# Patient Record
Sex: Male | Born: 1945 | Race: White | Hispanic: No | Smoking: Former smoker
Health system: Southern US, Community
[De-identification: ages and names within clinical notes are randomized; demographics above are authoritative.]

## PROBLEM LIST (undated history)

## (undated) DIAGNOSIS — R350 Frequency of micturition: Secondary | ICD-10-CM

## (undated) DIAGNOSIS — H9319 Tinnitus, unspecified ear: Secondary | ICD-10-CM

## (undated) DIAGNOSIS — G4730 Sleep apnea, unspecified: Secondary | ICD-10-CM

## (undated) DIAGNOSIS — M25562 Pain in left knee: Secondary | ICD-10-CM

## (undated) DIAGNOSIS — I2510 Atherosclerotic heart disease of native coronary artery without angina pectoris: Secondary | ICD-10-CM

## (undated) DIAGNOSIS — K5790 Diverticulosis of intestine, part unspecified, without perforation or abscess without bleeding: Secondary | ICD-10-CM

## (undated) DIAGNOSIS — E119 Type 2 diabetes mellitus without complications: Secondary | ICD-10-CM

## (undated) DIAGNOSIS — J984 Other disorders of lung: Secondary | ICD-10-CM

## (undated) DIAGNOSIS — R3915 Urgency of urination: Secondary | ICD-10-CM

## (undated) DIAGNOSIS — K219 Gastro-esophageal reflux disease without esophagitis: Secondary | ICD-10-CM

## (undated) DIAGNOSIS — Z973 Presence of spectacles and contact lenses: Secondary | ICD-10-CM

## (undated) DIAGNOSIS — M1990 Unspecified osteoarthritis, unspecified site: Secondary | ICD-10-CM

## (undated) DIAGNOSIS — R011 Cardiac murmur, unspecified: Secondary | ICD-10-CM

## (undated) DIAGNOSIS — I350 Nonrheumatic aortic (valve) stenosis: Secondary | ICD-10-CM

## (undated) DIAGNOSIS — E785 Hyperlipidemia, unspecified: Secondary | ICD-10-CM

## (undated) DIAGNOSIS — R7303 Prediabetes: Secondary | ICD-10-CM

## (undated) DIAGNOSIS — I4891 Unspecified atrial fibrillation: Secondary | ICD-10-CM

## (undated) DIAGNOSIS — K449 Diaphragmatic hernia without obstruction or gangrene: Secondary | ICD-10-CM

## (undated) DIAGNOSIS — N1330 Unspecified hydronephrosis: Secondary | ICD-10-CM

## (undated) DIAGNOSIS — I10 Essential (primary) hypertension: Secondary | ICD-10-CM

## (undated) DIAGNOSIS — N3281 Overactive bladder: Secondary | ICD-10-CM

## (undated) DIAGNOSIS — J45909 Unspecified asthma, uncomplicated: Secondary | ICD-10-CM

## (undated) DIAGNOSIS — N529 Male erectile dysfunction, unspecified: Secondary | ICD-10-CM

## (undated) DIAGNOSIS — M25561 Pain in right knee: Secondary | ICD-10-CM

## (undated) HISTORY — DX: Essential (primary) hypertension: I10

## (undated) HISTORY — PX: CHOLECYSTECTOMY: SHX55

## (undated) HISTORY — DX: Unspecified asthma, uncomplicated: J45.909

## (undated) HISTORY — DX: Hyperlipidemia, unspecified: E78.5

## (undated) HISTORY — DX: Prediabetes: R73.03

## (undated) HISTORY — DX: Pain in right knee: M25.561

## (undated) HISTORY — DX: Unspecified atrial fibrillation: I48.91

## (undated) HISTORY — PX: TONSILLECTOMY: SUR1361

## (undated) HISTORY — DX: Cardiac murmur, unspecified: R01.1

## (undated) HISTORY — DX: Male erectile dysfunction, unspecified: N52.9

## (undated) HISTORY — DX: Unspecified osteoarthritis, unspecified site: M19.90

## (undated) HISTORY — DX: Pain in right knee: M25.562

## (undated) HISTORY — PX: FINGER SURGERY: SHX640

## (undated) HISTORY — DX: Diaphragmatic hernia without obstruction or gangrene: K44.9

## (undated) HISTORY — DX: Diverticulosis of intestine, part unspecified, without perforation or abscess without bleeding: K57.90

## (undated) HISTORY — DX: Gastro-esophageal reflux disease without esophagitis: K21.9

---

## 1998-04-29 ENCOUNTER — Emergency Department (HOSPITAL_COMMUNITY): Admission: EM | Admit: 1998-04-29 | Discharge: 1998-04-29 | Payer: Self-pay | Admitting: Emergency Medicine

## 1998-04-29 ENCOUNTER — Encounter: Payer: Self-pay | Admitting: Emergency Medicine

## 1999-07-31 ENCOUNTER — Encounter: Payer: Self-pay | Admitting: Family Medicine

## 1999-07-31 ENCOUNTER — Encounter: Admission: RE | Admit: 1999-07-31 | Discharge: 1999-07-31 | Payer: Self-pay | Admitting: Family Medicine

## 1999-08-01 ENCOUNTER — Emergency Department (HOSPITAL_COMMUNITY): Admission: EM | Admit: 1999-08-01 | Discharge: 1999-08-01 | Payer: Self-pay | Admitting: Emergency Medicine

## 1999-08-07 ENCOUNTER — Encounter: Admission: RE | Admit: 1999-08-07 | Discharge: 1999-08-07 | Payer: Self-pay | Admitting: Family Medicine

## 1999-08-07 ENCOUNTER — Encounter: Payer: Self-pay | Admitting: Family Medicine

## 1999-09-06 ENCOUNTER — Encounter: Payer: Self-pay | Admitting: General Surgery

## 1999-09-06 ENCOUNTER — Ambulatory Visit (HOSPITAL_COMMUNITY): Admission: RE | Admit: 1999-09-06 | Discharge: 1999-09-07 | Payer: Self-pay | Admitting: General Surgery

## 1999-09-06 ENCOUNTER — Encounter (INDEPENDENT_AMBULATORY_CARE_PROVIDER_SITE_OTHER): Payer: Self-pay | Admitting: *Deleted

## 2000-12-21 ENCOUNTER — Emergency Department (HOSPITAL_COMMUNITY): Admission: EM | Admit: 2000-12-21 | Discharge: 2000-12-21 | Payer: Self-pay | Admitting: Emergency Medicine

## 2000-12-21 ENCOUNTER — Encounter: Payer: Self-pay | Admitting: Emergency Medicine

## 2001-12-08 ENCOUNTER — Inpatient Hospital Stay (HOSPITAL_COMMUNITY): Admission: EM | Admit: 2001-12-08 | Discharge: 2001-12-09 | Payer: Self-pay | Admitting: *Deleted

## 2001-12-08 ENCOUNTER — Encounter: Payer: Self-pay | Admitting: Cardiology

## 2001-12-09 ENCOUNTER — Encounter: Payer: Self-pay | Admitting: Cardiovascular Disease

## 2003-01-09 ENCOUNTER — Emergency Department (HOSPITAL_COMMUNITY): Admission: EM | Admit: 2003-01-09 | Discharge: 2003-01-09 | Payer: Self-pay | Admitting: Emergency Medicine

## 2008-02-20 ENCOUNTER — Emergency Department (HOSPITAL_COMMUNITY): Admission: EM | Admit: 2008-02-20 | Discharge: 2008-02-20 | Payer: Self-pay | Admitting: Emergency Medicine

## 2008-08-01 ENCOUNTER — Ambulatory Visit: Payer: Self-pay | Admitting: Family Medicine

## 2008-11-23 ENCOUNTER — Ambulatory Visit: Payer: Self-pay | Admitting: Family Medicine

## 2009-06-12 ENCOUNTER — Ambulatory Visit: Payer: Self-pay | Admitting: Family Medicine

## 2009-06-12 ENCOUNTER — Encounter: Payer: Self-pay | Admitting: Cardiovascular Disease

## 2009-08-21 ENCOUNTER — Ambulatory Visit: Payer: Self-pay | Admitting: Physician Assistant

## 2009-11-28 ENCOUNTER — Ambulatory Visit: Payer: Self-pay | Admitting: Physician Assistant

## 2009-12-12 ENCOUNTER — Ambulatory Visit: Payer: Self-pay | Admitting: Physician Assistant

## 2010-02-09 ENCOUNTER — Ambulatory Visit: Payer: Self-pay | Admitting: Family Medicine

## 2010-04-09 ENCOUNTER — Ambulatory Visit: Payer: Self-pay | Admitting: Family Medicine

## 2010-04-09 ENCOUNTER — Encounter: Payer: Self-pay | Admitting: Cardiovascular Disease

## 2010-04-23 ENCOUNTER — Encounter: Payer: Self-pay | Admitting: Cardiovascular Disease

## 2010-04-23 ENCOUNTER — Ambulatory Visit: Payer: Self-pay | Admitting: Family Medicine

## 2010-05-02 DIAGNOSIS — M5430 Sciatica, unspecified side: Secondary | ICD-10-CM | POA: Insufficient documentation

## 2010-05-02 DIAGNOSIS — Z872 Personal history of diseases of the skin and subcutaneous tissue: Secondary | ICD-10-CM | POA: Insufficient documentation

## 2010-05-02 DIAGNOSIS — J301 Allergic rhinitis due to pollen: Secondary | ICD-10-CM | POA: Insufficient documentation

## 2010-05-02 DIAGNOSIS — Z8719 Personal history of other diseases of the digestive system: Secondary | ICD-10-CM | POA: Insufficient documentation

## 2010-05-08 ENCOUNTER — Ambulatory Visit: Payer: Self-pay | Admitting: Cardiovascular Disease

## 2010-05-08 ENCOUNTER — Encounter: Payer: Self-pay | Admitting: Cardiovascular Disease

## 2010-05-08 DIAGNOSIS — R079 Chest pain, unspecified: Secondary | ICD-10-CM | POA: Insufficient documentation

## 2010-05-08 DIAGNOSIS — R072 Precordial pain: Secondary | ICD-10-CM | POA: Insufficient documentation

## 2010-05-15 ENCOUNTER — Telehealth (INDEPENDENT_AMBULATORY_CARE_PROVIDER_SITE_OTHER): Payer: Self-pay | Admitting: Radiology

## 2010-05-16 ENCOUNTER — Encounter: Payer: Self-pay | Admitting: Cardiology

## 2010-05-16 ENCOUNTER — Ambulatory Visit: Payer: Self-pay

## 2010-05-16 ENCOUNTER — Encounter (HOSPITAL_COMMUNITY)
Admission: RE | Admit: 2010-05-16 | Discharge: 2010-06-19 | Payer: Self-pay | Source: Home / Self Care | Attending: Cardiovascular Disease | Admitting: Cardiovascular Disease

## 2010-05-20 HISTORY — PX: TOTAL KNEE ARTHROPLASTY: SHX125

## 2010-06-04 ENCOUNTER — Inpatient Hospital Stay (HOSPITAL_COMMUNITY)
Admission: RE | Admit: 2010-06-04 | Discharge: 2010-06-08 | Payer: Self-pay | Source: Home / Self Care | Attending: Orthopedic Surgery | Admitting: Orthopedic Surgery

## 2010-06-04 LAB — SURGICAL PCR SCREEN
MRSA, PCR: NEGATIVE
Staphylococcus aureus: NEGATIVE

## 2010-06-04 LAB — DIFFERENTIAL
Basophils Absolute: 0.1 10*3/uL (ref 0.0–0.1)
Basophils Relative: 1 % (ref 0–1)
Eosinophils Absolute: 0.1 10*3/uL (ref 0.0–0.7)
Eosinophils Relative: 2 % (ref 0–5)
Lymphocytes Relative: 24 % (ref 12–46)
Lymphs Abs: 1.5 10*3/uL (ref 0.7–4.0)
Monocytes Absolute: 0.8 10*3/uL (ref 0.1–1.0)
Monocytes Relative: 13 % — ABNORMAL HIGH (ref 3–12)
Neutro Abs: 3.6 10*3/uL (ref 1.7–7.7)
Neutrophils Relative %: 60 % (ref 43–77)

## 2010-06-04 LAB — COMPREHENSIVE METABOLIC PANEL
ALT: 43 U/L (ref 0–53)
AST: 29 U/L (ref 0–37)
Albumin: 4.1 g/dL (ref 3.5–5.2)
Alkaline Phosphatase: 55 U/L (ref 39–117)
BUN: 12 mg/dL (ref 6–23)
CO2: 25 mEq/L (ref 19–32)
Calcium: 9.4 mg/dL (ref 8.4–10.5)
Chloride: 105 mEq/L (ref 96–112)
Creatinine, Ser: 0.93 mg/dL (ref 0.4–1.5)
GFR calc Af Amer: >60 mL/min (ref >60)
GFR calc non Af Amer: >60 mL/min (ref >60)
Glucose, Bld: 108 mg/dL — ABNORMAL HIGH (ref 70–99)
Potassium: 4.5 mEq/L (ref 3.5–5.1)
Sodium: 138 mEq/L (ref 135–145)
Total Bilirubin: 0.8 mg/dL (ref 0.3–1.2)
Total Protein: 6.4 g/dL (ref 6.0–8.3)

## 2010-06-04 LAB — CBC
HCT: 45.5 % (ref 39.0–52.0)
Hemoglobin: 15.6 g/dL (ref 13.0–17.0)
MCH: 30.8 pg (ref 26.0–34.0)
MCHC: 34.3 g/dL (ref 30.0–36.0)
MCV: 89.7 fL (ref 78.0–100.0)
Platelets: 215 10*3/uL (ref 150–400)
RBC: 5.07 MIL/uL (ref 4.22–5.81)
RDW: 13.6 % (ref 11.5–15.5)
WBC: 6 10*3/uL (ref 4.0–10.5)

## 2010-06-04 LAB — URINALYSIS, ROUTINE W REFLEX MICROSCOPIC
Bilirubin Urine: NEGATIVE
Hgb urine dipstick: NEGATIVE
Ketones, ur: NEGATIVE mg/dL
Nitrite: NEGATIVE
Protein, ur: NEGATIVE mg/dL
Specific Gravity, Urine: 1.019 (ref 1.005–1.030)
Urine Glucose, Fasting: NEGATIVE mg/dL
Urobilinogen, UA: 0.2 mg/dL (ref 0.0–1.0)
pH: 6 (ref 5.0–8.0)

## 2010-06-04 LAB — URINE CULTURE
Colony Count: NO GROWTH
Culture  Setup Time: 201201111214
Culture: NO GROWTH

## 2010-06-04 LAB — PROTIME-INR
INR: 1.04 (ref 0.00–1.49)
Prothrombin Time: 13.8 seconds (ref 11.6–15.2)

## 2010-06-04 LAB — APTT: aPTT: 28 seconds (ref 24–37)

## 2010-06-04 LAB — ABO/RH: ABO/RH(D): A NEG

## 2010-06-06 LAB — BASIC METABOLIC PANEL
BUN: 14 mg/dL (ref 6–23)
CO2: 29 mEq/L (ref 19–32)
Calcium: 8.8 mg/dL (ref 8.4–10.5)
Chloride: 103 mEq/L (ref 96–112)
Creatinine, Ser: 0.94 mg/dL (ref 0.4–1.5)
GFR calc Af Amer: >60 mL/min (ref >60)
GFR calc non Af Amer: >60 mL/min (ref >60)
Glucose, Bld: 132 mg/dL — ABNORMAL HIGH (ref 70–99)
Potassium: 4 mEq/L (ref 3.5–5.1)
Sodium: 140 mEq/L (ref 135–145)

## 2010-06-06 LAB — CROSSMATCH
ABO/RH(D): A NEG
Antibody Screen: NEGATIVE
Unit division: 0
Unit division: 0

## 2010-06-06 LAB — CBC
HCT: 36.7 % — ABNORMAL LOW (ref 39.0–52.0)
Hemoglobin: 12.3 g/dL — ABNORMAL LOW (ref 13.0–17.0)
MCH: 30.1 pg (ref 26.0–34.0)
MCHC: 33.5 g/dL (ref 30.0–36.0)
MCV: 90 fL (ref 78.0–100.0)
Platelets: 192 10*3/uL (ref 150–400)
RBC: 4.08 MIL/uL — ABNORMAL LOW (ref 4.22–5.81)
RDW: 13.7 % (ref 11.5–15.5)
WBC: 12 10*3/uL — ABNORMAL HIGH (ref 4.0–10.5)

## 2010-06-11 LAB — CBC
HCT: 30.9 % — ABNORMAL LOW (ref 39.0–52.0)
HCT: 28.3 % — ABNORMAL LOW (ref 39.0–52.0)
Hemoglobin: 10.3 g/dL — ABNORMAL LOW (ref 13.0–17.0)
Hemoglobin: 9.5 g/dL — ABNORMAL LOW (ref 13.0–17.0)
MCH: 30.2 pg (ref 26.0–34.0)
MCH: 30 pg (ref 26.0–34.0)
MCHC: 33.3 g/dL (ref 30.0–36.0)
MCHC: 33.6 g/dL (ref 30.0–36.0)
MCV: 90.6 fL (ref 78.0–100.0)
MCV: 89.3 fL (ref 78.0–100.0)
Platelets: 166 10*3/uL (ref 150–400)
Platelets: 168 10*3/uL (ref 150–400)
RBC: 3.41 MIL/uL — ABNORMAL LOW (ref 4.22–5.81)
RBC: 3.17 MIL/uL — ABNORMAL LOW (ref 4.22–5.81)
RDW: 14.1 % (ref 11.5–15.5)
RDW: 14 % (ref 11.5–15.5)
WBC: 12.3 10*3/uL — ABNORMAL HIGH (ref 4.0–10.5)
WBC: 8.6 10*3/uL (ref 4.0–10.5)

## 2010-06-11 LAB — BASIC METABOLIC PANEL
BUN: 21 mg/dL (ref 6–23)
BUN: 13 mg/dL (ref 6–23)
CO2: 24 mEq/L (ref 19–32)
CO2: 26 mEq/L (ref 19–32)
Calcium: 8.1 mg/dL — ABNORMAL LOW (ref 8.4–10.5)
Calcium: 8.1 mg/dL — ABNORMAL LOW (ref 8.4–10.5)
Chloride: 101 mEq/L (ref 96–112)
Chloride: 105 mEq/L (ref 96–112)
Creatinine, Ser: 0.82 mg/dL (ref 0.4–1.5)
Creatinine, Ser: 0.87 mg/dL (ref 0.4–1.5)
GFR calc Af Amer: >60 mL/min (ref >60)
GFR calc Af Amer: >60 mL/min (ref >60)
GFR calc non Af Amer: >60 mL/min (ref >60)
GFR calc non Af Amer: >60 mL/min (ref >60)
Glucose, Bld: 132 mg/dL — ABNORMAL HIGH (ref 70–99)
Glucose, Bld: 125 mg/dL — ABNORMAL HIGH (ref 70–99)
Potassium: 4.4 mEq/L (ref 3.5–5.1)
Potassium: 4.3 mEq/L (ref 3.5–5.1)
Sodium: 133 mEq/L — ABNORMAL LOW (ref 135–145)
Sodium: 135 mEq/L (ref 135–145)

## 2010-06-21 NOTE — Progress Notes (Signed)
Summary: Nuc pre-Procedure  Phone Note Outgoing Call Call back at Home Phone [phone]   Call placed by: Henrine Screws Call placed to: Patient Reason for Call: Confirm/change Appt Summary of Call: Left message with information on Myoview Information Sheet (see scanned document for details).  Initial call taken by: Leonia Corona, RT-N,  May 15, 2010 5:27 PM     Nuclear Med Background Indications for Stress Test: Evaluation for Ischemia, Surgical Clearance  Indications Comments: Pending left knee surgery  History: Myocardial Perfusion Study  History Comments: 2003- Nl. MPS. EF= 65%     Nuclear Pre-Procedure Cardiac Risk Factors: Hypertension, Lipids, Smoker Height (in): 72

## 2010-06-21 NOTE — Letter (Signed)
Summary: [facility]  [facility]   Imported By: Marylou Mccoy 05/29/2010 11:50:51  _____________________________________________________________________  External Attachment:    Type:   Image     Comment:   External Document

## 2010-06-21 NOTE — Letter (Signed)
Summary: [facility]  [facility]   Imported By: Marylou Mccoy 05/29/2010 11:51:49  _____________________________________________________________________  External Attachment:    Type:   Image     Comment:   External Document

## 2010-06-21 NOTE — Assessment & Plan Note (Signed)
Summary: np6. surgical clearance for left total knee. pt has aarp . me...   Visit Type:  Initial Consult Primary Kole Hilyard:  Sharlot Gowda, MD  CC:  Pre-op risk assessment.  History of Present Illness: 65 yo white male with history of OA, HTN, hyperlipidemia and tobacco abuse who is here today for pre-operative risk assessment prior to planned left knee surgery. He tells me that he has been feeling well except for left knee pain. He has his typical GERD type symptoms but no exertional chest pain. He has no dyspnea with exertion. No dizziness, near syncope or syncope. He tells me that his blood pressure has been well controlled. He has been on a statin for several years. He is an active individual. He stopped smoking about 4 months ago. He tells me that he has occasional pain in his right leg along the lateral aspect of the lower leg. He has no cramping in the calf muscles c/w claudication.   Current Medications (verified): 1)  Pravachol 40 Mg Tabs (Pravastatin Sodium) .Marland Kitchen.. 1 By Mouth Daily 2)  Benicar 20 Mg Tabs (Olmesartan Medoxomil) .Marland Kitchen.. 1 By Mouth Daily 3)  Toprol Xl 50 Mg Xr24h-Tab (Metoprolol Succinate) .Marland Kitchen.. 1 By Mouth Daily 4)  Fish Oil   Oil (Fish Oil) .... 4 By Mouth Daily 5)  Multivitamins   Tabs (Multiple Vitamin) .Marland Kitchen.. 1 By Mouth Daily 6)  Aspirin 81 Mg  Tabs (Aspirin) .Marland Kitchen.. 1 By Mouth Daily 7)  Glucosamine-Chondroitin 250-200 Mg Caps (Glucosamine-Chondroitin) .Marland Kitchen.. 1 By Mouth Daily  Allergies (verified): 1)  ! Tagamet  Past History:  Past Medical History: HIATAL HERNIA HTN ALLERGIC RHINITIS OA bilateral knees Hyperlipidemia  Past Surgical History: Cholecystectomy Tonsillectomy Finger surgery  Family History: Mother died at age 70 from uterine cancer Father died at age 32 from ?natural causes-?CAD No siblings Paternal Grandfather with CAD  Social History: The patient lives in [place]  He is  married.    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  He has  a tobacco history-smoked for 25 years-quit September 2011 He also drinks one to two glasses of wine  per day. No illlicit drug use   Drug Use - no  Review of Systems       The patient complains of chest pain.  The patient denies fatigue, malaise, fever, weight gain/loss, vision loss, decreased hearing, hoarseness, palpitations, shortness of breath, prolonged cough, wheezing, sleep apnea, coughing up blood, abdominal pain, blood in stool, nausea, vomiting, diarrhea, heartburn, incontinence, blood in urine, muscle weakness, joint pain, leg swelling, rash, skin lesions, headache, fainting, dizziness, depression, anxiety, enlarged lymph nodes, easy bruising or bleeding, and environmental allergies.    Vital Signs:  Patient profile:   65 year old male Height:      72 inches Weight:      248 pounds BMI:     33.76 Pulse rate:   64 / minute Resp:     16 per minute BP sitting:   142 / 84  (right arm)  Vitals Entered By: Marrion Coy, CNA (May 08, 2010 8:35 AM)  Physical Exam  General:  General: Well developed, well nourished, NAD HEENT: OP clear, mucus membranes moist SKIN: warm, dry Neuro: No focal deficits Musculoskeletal: Muscle strength 5/5 all ext Psychiatric: Mood and affect normal Neck: No JVD, no carotid bruits, no thyromegaly, no lymphadenopathy. Lungs:Clear bilaterally, no wheezes, rhonci, crackles CV: RRR no murmurs, gallops rubs Abdomen: soft, NT, ND, BS present Extremities: No edema, pulses 2+.  EKG  Procedure date:  05/08/2010  Findings:      NSR, rate 63 bpm  Impression & Recommendations:  Problem # 1:  PRE-OPERATIVE CARDIAC EXAM (ICD-V72.81) Given his risk factors for CAD including family history, tobacco abuse, HTN, hyperlipidemia and obesity, recommend some form of ischemic testing. We have discussed exercise testing but he does not think he can exercise adequately with his severe knee pain. Will arrange Lexiscan stress myoview to rule out ischemia  prior to his planned surgical procedure.   His updated medication list for this problem includes:    Toprol Xl 50 Mg Xr24h-tab (Metoprolol succinate) .Marland Kitchen... 1 by mouth daily    Aspirin 81 Mg Tabs (Aspirin) .Marland Kitchen... 1 by mouth daily  Problem # 2:  CHEST PAIN-PRECORDIAL (ICD-786.51) See above.   His updated medication list for this problem includes:    Toprol Xl 50 Mg Xr24h-tab (Metoprolol succinate) .Marland Kitchen... 1 by mouth daily    Aspirin 81 Mg Tabs (Aspirin) .Marland Kitchen... 1 by mouth daily  Orders: Nuclear Stress Test (Nuc Stress Test)  Other Orders: EKG w/ Interpretation (93000)  Patient Instructions: 1)  Your physician has requested that you have a lexiscan myoview.  For further information please visit [email or web address]  Please follow instruction sheet, as given.

## 2010-06-21 NOTE — Assessment & Plan Note (Signed)
Summary: Cardiology Nuclear Testing  Nuclear Med Background Indications for Stress Test: Evaluation for Ischemia, Surgical Clearance  Indications Comments: Pending left knee surgery by Dr. Georgena Spurling  History: Myocardial Perfusion Study  History Comments: '03 ZOX:WRUEAV, EF=65%   Symptoms Comments: no cardiac complaints   Nuclear Pre-Procedure Cardiac Risk Factors: Family History - CAD, History of Smoking, Hypertension, Lipids, Obesity Caffeine/Decaff Intake: none  NPO After: 7:00 PM Lungs: Clear.  O2 Sat 98% on RA. IV 0.9% NS with Angio Cath: 22g     IV Site: R Hand IV Started by: Cathlyn Parsons, RN Chest Size (in) 42     Height (in): 72 Weight (lb): 243 BMI: 33.08 Tech Comments: Toprol held x 13 hours.  Nuclear Med Study 1 or 2 day study:  1 day     Stress Test Type:  Eugenie Birks Reading MD:  Olga Millers, MD     Referring MD:  Verne Carrow, MD Resting Radionuclide:  Technetium 71m Tetrofosmin     Resting Radionuclide Dose:  10.7 mCi  Stress Radionuclide:  Technetium 73m Tetrofosmin     Stress Radionuclide Dose:  33.0 mCi   Stress Protocol   Lexiscan: 0.4 mg   Stress Test Technologist:  Rea College, CMA-N     Nuclear Technologist:  Doyne Keel, CNMT  Rest Procedure  Myocardial perfusion imaging was performed at rest 45 minutes following the intravenous administration of Technetium 14m Tetrofosmin.  Stress Procedure  The patient received IV Lexiscan 0.4 mg over 15-seconds.  Technetium 66m Tetrofosmin injected at 30-seconds.  There were no significant changes with infusion.  Quantitative spect images were obtained after a 45 minute delay.  QPS Raw Data Images:  Acquisition technically good; normal left ventricular size. Stress Images:  There is decreased uptake in the inferior wall Rest Images:  There is decreased uptake in the inferior wall. Subtraction (SDS):  No evidence of ischemia. Transient Ischemic Dilatation:  0.87  (Normal <1.22)  Lung/Heart  Ratio:  0.21  (Normal <0.45)  Quantitative Gated Spect Images QGS EDV:  102 ml QGS ESV:  34 ml QGS EF:  67 % QGS cine images:  Normal wall motion.   Overall Impression  Exercise Capacity: Lexiscan with no exercise. BP Response: Normal blood pressure response. Clinical Symptoms: No chest pain ECG Impression: No significant ST segment change suggestive of ischemia. Overall Impression: Normal lexiscan nuclear study with inferior thinning but no ischemia.  Appended Document: Cardiology Nuclear Testing Normal nuclear study. No ischemia. He does not need further cardiac workup before his surgery. Can we let him know? thanks, cdm  Appended Document: Cardiology Nuclear Testing Pt aware of results and were sent at his request to Dr. Susann Givens and Dr. Sherlean Foot. Mylo Red RN

## 2010-07-10 NOTE — Discharge Summary (Signed)
NAME:  [place], [place] NO.:  [id number]  MEDICAL RECORD NO.:  [id number]          PATIENT TYPE:  INP  LOCATION:  5011                         FACILITY:  [facility]  PHYSICIAN:  Mila Homer. Sherlean Foot, M.D. DATE OF BIRTH:  1945-06-08  DATE OF ADMISSION:  06/04/2010 DATE OF DISCHARGE:  06/08/2010                              DISCHARGE SUMMARY   ADMISSION DIAGNOSIS:  Osteoarthritis of the left knee.  DISCHARGE DIAGNOSES: 1. Osteoarthritis of the left knee. 2. Status post left total knee arthroplasty. 3. Acute blood loss anemia status post surgery.  PROCEDURE:  Left total knee arthroplasty.  HISTORY:  The patient is a 65 year old male complaining of pain in left knee now interfering with activities of daily living.  Conservative treatment had failed.  Risks and benefits of surgery were discussed with the patient.  The patient would like to proceed with surgery.  ALLERGIES:  UPON ADMISSION TO THE HOSPITAL THE PATIENT WAS ALLERGIC TO TAGAMET WHICH CAUSED NERVOUSNESS.  ADMISSION MEDICATIONS:  Upon admission to hospital the patient was taking: 1. Melatonin over-the-counter daily. 2. Ibuprofen 200 mg daily over-the-counter 1-2 tablets every 8 hours. 3. Over-the-counter aspirin daily, stopped 10 days before surgery. 4. Pravastatin 40 mg daily. 5. Metoprolol 50 mg every evening. 6. Benicar 20 mg daily.  HOSPITAL COURSE:  This is a 65 year old male admitted on June 04, 2010.  After appropriate laboratory studies were obtained preoperatively as well as Ancef on call to the operating room, he was taken to the OR where he underwent a left total knee arthroplasty.  He tolerated the procedure well and was taken to the PACU in good condition.  He was placed on pain medications IV as needed as well as p.o. pain medication.  Postop day #1 vital signs were stable.  The patient denied chest pain, shortness of breath or calf pain.  The patient was started on Xarelto 10 mg p.o.  daily at 8 a.m.  Consults with PT, OT and Care Management were made.  The patient was weightbearing as tolerated.  CPM 0 to 90 degrees for 6 to 8 hours per day.  Incentive spirometry was taught.  Postop day #2 patient continued to progress slowly with physical therapy.  Dressing was changed.  Marcaine pump and Hemovac were discontinued.  The patient was continued on p.o. pain medication. Postop day #3 the patient continued to progress with physical therapy a little bit more, still was not hitting his goals and ended up staying for 1 more night.  Postop day #4 the patient was able to hit all his physical therapy goals and was discharged that morning.  Upon discharge from the hospital the patient's vital signs were stable.  LABORATORY STUDIES:  The patient's H and H were 8.1/24.0.  Sodium, potassium, chloride, CO2, BUN and creatinine and glucose were all in normal range.  DISCHARGE INSTRUCTIONS:  There are no restrictions to diet.  He is to follow the wound care sheet for wound care.  Increase activity slowly. May use a cane or walker, weightbearing as tolerated.  No lifting or driving for 6 weeks.  Home health has been established.  CPM 0 to 90  degrees for 6 to 8 hours a day for 2 weeks and bilateral compression stockings for 3 weeks.  Upon discharge from the hospital the patient was given a prescription for Xarelto 10 mg daily for 10 days, Robaxin 500 mg 1 or 2 tabs every 6 to 8 hours for pain, oxycodone 5 mg 1 or 2 tabs every 4 to 6 hours for pain and OxyContin 20 mg 1 twice a day for pain.  The patient will follow up with Dr. Sherlean Foot in 2 weeks.  Call for appointment at [phone].  Discharged in improved condition.    ______________________________ Altamese Cabal, PA-C   ______________________________ Mila Homer. Sherlean Foot, M.D.    MJ/MEDQ  D:  06/26/2010  T:  06/26/2010  Job:  [id number]  Electronically Signed by Altamese Cabal PA-C on 07/10/2010 12:03:21 PM Electronically Signed  by Georgena Spurling M.D. on 07/10/2010 09:01:26 PM

## 2010-07-10 NOTE — Op Note (Signed)
NAME:  [place], [place] NO.:  [id number]  MEDICAL RECORD NO.:  [id number]          PATIENT TYPE:  INP  LOCATION:  5011                         FACILITY:  [facility]  PHYSICIAN:  Mila Homer. Sherlean Foot, M.D. DATE OF BIRTH:  10-09-1945  DATE OF PROCEDURE:  06/04/2010 DATE OF DISCHARGE:                              OPERATIVE REPORT   SURGEON:  Mila Homer. Sherlean Foot, MD  ASSISTANT:  Skip Mayer, PA-C  PREOPERATIVE DIAGNOSIS:  Left knee osteoarthritis.  POSTOPERATIVE DIAGNOSIS:  Left knee osteoarthritis.  PROCEDURE:  Left total knee arthroplasty.  INDICATIONS FOR PROCEDURE:  The patient is a 65 year old white male with failure of conservative measures for osteoarthritis of the knee. Informed consent was obtained.  DESCRIPTION OF PROCEDURE:  The patient was laid supine, administered general anesthesia.  Left leg was prepped and draped in the usual sterile fashion.  The extremity was exsanguinated with Esmarch. Tourniquet was inflated to 350 mmHg for an hour.  A midline incision was made with a #10 blade.  New blade was used to make a medial parapatellar arthrotomy and performed synovectomy.  I then elevated the deep MCL off the medial crest of tibia around semimembranosus tendon.  I everted the patella, it measured 25 mm thick.  I reamed down 9 mm, drilled through lug holes through a 38-mm template.  With prosthetic trial in place, I re-created a 25-mm thickness.  Then removed the trial component, went into flexion subluxing the patella laterally.  I used external alignment system on the tibia to make perpendicular cut to the anatomic axis of the tibia and 3 degrees posterior slope.  I used intramedullary system on the femur to make a 6-degree valgus cut.  Then marked out the epicondylar axis, posterior condylar angle measured 3 degrees.  I then sized to a size F, pinned to 3 degrees external rotation, holes made to the anterior and posterior chamfer cuts with sagittal saw  through the formal cutting block.  I then placed a lamina spreader in the knee, removed the ACL, PCL, medial lateral menisci, and posterior condylar osteophytes.  I placed a 12-mm space block and had good flexion and extension gap balance.  I was able then to get a size 14 in with no problem and had good extension and it cured his flexion contracture which was __________ posteromedial contracture.  Then, I removed trial components, copiously irrigated and cemented in the components and removed all excess cement.  Allowed the cement to harden in extension. A left Hemovac coming out superolaterally and deep to the arthrotomy. Pain catheter coming out superomedially and superficially to the arthrotomy.  I closed the arthrotomy with figure-of-eight #1 Vicryl sutures, buried 0 Vicryl in the deep soft tissue, subcuticular 2-0 Vicryl stitch, and skin staples.  Dressed with Xeroform dressing, sponges, sterile Webril, and TED stocking.  COMPLICATIONS:  None.  DRAINS:  One Hemovac and one pain catheter.  ESTIMATED BLOOD LOSS:  300 mL.        ______________________________ Mila Homer. Sherlean Foot, M.D.     SDL/MEDQ  D:  06/04/2010  T:  06/05/2010  Job:  [id number]  Electronically Signed by Georgena Spurling M.D. on  07/10/2010 09:01:20 PM

## 2010-10-01 ENCOUNTER — Telehealth: Payer: Self-pay | Admitting: Family Medicine

## 2010-10-01 MED ORDER — OLMESARTAN MEDOXOMIL 20 MG PO TABS: 20.0000 mg | ORAL_TABLET | Freq: Every day | ORAL | Status: DC

## 2010-10-01 NOTE — Telephone Encounter (Signed)
Patient wants a written prescription

## 2010-10-05 NOTE — Discharge Summary (Signed)
NAME:  [place], [place]                      ACCOUNT NO.:  [id number]   MEDICAL RECORD NO.:  [id number]                   PATIENT TYPE:  INP   LOCATION:  3714                                 FACILITY:  [facility]   PHYSICIAN:  Arturo Morton. Riley Kill, M.D. [facility]         DATE OF BIRTH:  1946-04-09   DATE OF ADMISSION:  12/08/2001  DATE OF DISCHARGE:  12/09/2001                           DISCHARGE SUMMARY - REFERRING   BRIEF HISTORY:  This is a 65 year old male admitted to [facility]  12/08/2001 with left shoulder and arm pain.  The patient had seen his primary  physician who was concerned that the pain was also radiating to the  patient's jaw.  He was sent to [facility] for further evaluation  where he was admitted by Dr. Jens Som.   PAST MEDICAL HISTORY:  1. Bronchitis.  2. History of seasonal allergies.  3. History of hiatal hernia.  4. Status post cholecystectomy.  5. Status post tonsillectomy.   ALLERGIES:  TAGAMET makes him nervous.   MEDICATIONS PRIOR TO ADMISSION:  Aspirin 325 mg daily.   SOCIAL HISTORY:  The patient lives in [place].  He was married.  He has  no children.  He works doing maintenance.  He has a tobacco history.  He  also drinks one to two beers per day.   HOSPITAL COURSE:  As [facility] atypical chest pain, also with some questionable  sinusitis.  Enzymes were performed, and these were found to be negative.  He  was scheduled for an exercise Cardiolite that was performed on 12/09/2001.  The Cardiolite was negative for ischemia.  Ejection fraction was 65%.  A CT  scan of the head revealed chronic sinusitis.  Arrangements were made to  discharge the patient home later that evening, 12/09/2001.   LABORATORY DATA:  Please see Cardiolite and CT scan results as noted above.   A CBC on 12/09/2001 was within normal limits.  Chemistry profile was within  normal limits.  Cardiac enzymes were  negative.  A lipid profile revealed  cholesterol 174, triglycerides 177, HDL 33, LDL 106.  TSH 0.953.   MRI of the left shoulder showed mild distal supraspinatus tendonopathy.  There was no rotator cuff tear.  The biceps tendon appeared to be intact.  There was no abnormal regional osseous edema noted.  There were mild  degenerative acromioclavicular arthropathy and laterally downsloping  acromion predisposing to rotator cuff impingement.   DISCHARGE MEDICATIONS:  1. Aspirin 325 mg daily.  2. Protonix 40 mg daily.  3. Ibuprofen p.r.n.   FOLLOW UP:  The patient was told to follow up with his primary physician in  one to two weeks.    DISCHARGE DIAGNOSES:  1. Shoulder pain, myocardial infarction ruled out.  2. Exercise Cardiolite performed 12/09/2001 negative for ischemia, ejection     fraction 65%.  3. CT scan consistent with chronic sinusitis.  4. MRI of the shoulder as noted above.  5. Tobacco history.  6. History of bronchitis.  7. Questionable history of hiatal hernia.  8. Status post multiple surgeries.     Delton See, P.A. [facility]                  Thomas D. Riley Kill, M.D. [facility]    DR/MEDQ  D:  01/21/2002  T:  01/22/2002  Job:  [id number]

## 2010-10-05 NOTE — Op Note (Signed)
[place]. [facility]  Patient:    Gary, Carroll Visit Number: [id number] MRN: [id number]          Service Type: MED Location: 3700 [id number] Attending Physician:  [place] Dictated by:   [place], M.D. [place] Admit Date:  12/08/2001 Discharge Date: 12/09/2001   CC:         [place], M.D.  [place], M.D. [place]   Operative Report  PROVIDERS:  Primary physician, [place]. Andrey [place], M.D.  Primary cardiologist, [place], M.D.  HISTORY OF PRESENT ILLNESS:  [name] is a 65 year old patient who has had two previous stress tests with Dr. Riley [place].  He was referred to the ER today by Dr. Andrey [place] for atypical chest and shoulder pain.  The patient has a long-standing history of left shoulder aching and soreness.  I think these have precipitated previous stress test.  He has not had previous shoulder injury but particularly notices shoulder blade pain.  He works as a Chartered certified accountant and this pain does not, however, limit his ability to perform his job.  He also has associated left jaw pain and occasional toothache.  He has significant allergies as well as sinus difficulty with nasal stuffiness.  Today after walking outside after some co-workers had cut some grass, he had some difficulty breathing and dyspnea.   He takes occasional aspirin and Tylenol, which does help his shoulder blade pain.  He has not had any other significant history of coronary artery disease, valvular disease, or syncope.  MEDICATIONS:  He takes an aspirin a day and occasional Advil sinus.  PAST MEDICAL HISTORY:  Remarkable for a cholecystectomy in 2001, some bronchitis, seasonal allergies, and a possible hiatal hernia.  ALLERGIES:  He does admit that TAGAMET makes him nervous.  SOCIAL HISTORY:  [place] lives in [place].  He has been married for 16 years and works as a Chartered certified accountant at [employer].  PHYSICAL  EXAMINATION:  VITAL SIGNS:  Blood pressure is 140/80, pulse is 70 and regular.  GENERAL:  Remarkable for a ruddy complexion.  NECK:  Carotids normal.  There was no adenopathy.  MUSCULOSKELETAL:  The left shoulder does not appear to be edematous or inflamed.  CHEST:  Lungs are clear.  CARDIAC:  There is an S1, S2, distant heart sounds.  ABDOMEN:  Benign.  EXTREMITIES:  Intact pulses, no edema.  LABORATORY DATA:  His chest x-ray shows low volumes but no active disease. His EKG is totally normal.  IMPRESSION:  I do not think that his shoulder blade pain or his jaw pain represent any significant cardiac disease.  I suspect he does have seasonal allergies and possible occasional bronchitis with wheeziness.  Currently he is asymptomatic.  This is, in fact, a chronic problem.  I think it is reasonable to perform an inpatient stress Cardiolite study tomorrow.  At the same time we will do an MRI of his shoulder and possible plain view x-rays of his sinuses to further assess them.  I would be surprise if this constellation of symptoms represents an anginal equivalent.  If his stress test is negative, he will be discharged home tomorrow to have further follow-up of his sinuses and shoulder problems with Dr. Margrett [place]. Dictated by:   [place], M.D. [place] Attending Physician:  [place] DD:  12/08/01 TD:  12/12/01 Job: [id number]

## 2010-10-05 NOTE — Op Note (Signed)
[place]  Patient:    Gary Carroll, Gary Carroll                   MRN: [id number] Proc. Date: 09/06/99 Adm. Date:  [id number] Attending:  Tempie Donning CC:         [place]. Andrey Campanile, M.D.                           Operative Report  PREOPERATIVE DIAGNOSIS:  Gallstone, duodenal diverticulum.  POSTOPERATIVE DIAGNOSIS:  Gallstone, duodenal diverticulum.  OPERATION PERFORMED:  Laparoscopic cholecystectomy with intraoperative cholangiogram.  SURGEON:  Gita Kudo, M.D.  ASSISTANT:  Milus Mallick, M.D.  ANESTHESIA:  General endotracheal.  INDICATIONS FOR PROCEDURE:  The patient is a 65 year old male with a long history of abdominal pain.  Recent upper GI and gallbladder ultrasound shows a large duodenal diverticulum as well as gallstone.  His liver function studies are normal.  His symptoms are not typical of gallbladder disease and could be from the diverticulum.  But, since duodenal diverticular surgery is rather complex and not always necessary, it was felt wise to remove the gallbladder and see how he does clinically.  OPERATIVE FINDINGS:  His gallbladder was thin walled, not acutely inflamed. He had a large duodenal diverticulum on intraoperative cholangiogram.  DESCRIPTION OF PROCEDURE:  Under satisfactory general endotracheal anesthesia, having received 1.0 gm Ancef preop, the patients abdomen was prepped and draped in the standard fashion.  He had a previous umbilical hernia and the supraumbilical incision was made just into this hernia incision.  The midline was opened and controlled with a figure-of-eight 0 Vicryl and operating Hasson port inserted and secured.  Good CO2 pneumoperitoneum established and camera placed.  Under direct vision, two #5 ports placed laterally and a second #10 port medially.  Each of these areas was infiltrated with Marcaine.  Then the graspers placed in the lateral ports gave excellent exposure and  operating through the medial port, I took down the large amount of fat in the gallbladder cystic duct junction.  The cystic duct was clipped close to the gallbladder after right ankle clamp used to circumferentially dissect it and be certain of the anatomy.  An incision made in the cystic duct and a percutaneously placed cholangiogram catheter placed and x-rays obtained.  They showed no obstruction and a large diverticulum of the duodenum.  Following this, the catheter was removed and the cystic duct controlled with multiple metal clips.  The cystic artery was likewise identified, dissected and controlled with clips and transected.  The gallbladder was then removed from below upward using the coagulating spatula for hemostasis and dissection.  A small posterior cystic artery branch was controlled with metal clips.  After the gallbladder was removed, it was taken out through the umbilical port in an endocatch bag after the camera was moved to the upper port.  Following this, the abdomen was copiously lavaged with saline since there was hole made in the gallbladder and some bile spillage.  The results were clear after two liters of warm saline.  Then CO2 and all instruments were removed.  The midline closed with the previous suture and a second Vicryl suture.  The upper medial incision fascia approximated with a single 0 Vicryl suture and then all subcu approximated with 4-0 Vicryl and Steri-Strips.  The umbilical incision also was infiltrated with Marcaine.  Sterile absorbent dressings applied.  The patient went to the recovery  room from the operating room in good condition without complication. DD:  09/06/99 TD:  09/06/99 Job: [id number]

## 2010-11-12 ENCOUNTER — Encounter: Payer: Self-pay | Admitting: Cardiovascular Disease

## 2010-12-19 ENCOUNTER — Encounter: Payer: Self-pay | Admitting: Family Medicine

## 2010-12-20 ENCOUNTER — Ambulatory Visit (INDEPENDENT_AMBULATORY_CARE_PROVIDER_SITE_OTHER): Payer: Medicare Other | Admitting: Family Medicine

## 2010-12-20 ENCOUNTER — Encounter: Payer: Self-pay | Admitting: Family Medicine

## 2010-12-20 VITALS — BP 130/60 | HR 68 | Ht 72.0 in | Wt 241.0 lb

## 2010-12-20 DIAGNOSIS — E782 Mixed hyperlipidemia: Secondary | ICD-10-CM

## 2010-12-20 DIAGNOSIS — E785 Hyperlipidemia, unspecified: Secondary | ICD-10-CM | POA: Insufficient documentation

## 2010-12-20 DIAGNOSIS — I10 Essential (primary) hypertension: Secondary | ICD-10-CM | POA: Insufficient documentation

## 2010-12-20 MED ORDER — LOSARTAN POTASSIUM 100 MG PO TABS: ORAL_TABLET | ORAL | Status: DC

## 2010-12-20 NOTE — Progress Notes (Signed)
Patient presents to discuss changing his blood pressure medications due to cost.  Benicar and Toprol XL (generic) are too expensive. Previously took Diovan HCT, had slight cough.  Changed to Losartan HCTZ, but had to stop due to significant cough/congestion.  Was changed to Benicar at that point, and has done well on it, but it has gotten too expensive.  He quit smoking since the time he was coughing with those medications.  He is asking to retry the medications if they are less expensive.  Also would like to change the Toprol XL, as this is costly, preferring to change to BID metoprolol to save money.  Also having some some libido and erectile issues.  He is married, but recently was back in contact with his ex-wife, and considering an affair.  He brings in recent labs from [facility] 11/28/10--chol 140, TG 132, LDL 83, HDL 30, glu 89.  TG much improved from last labs through our office.  He reports getting regular exercise, and cut back on sweets/sugar.  He also started taking 2000mg  of fish oil daily.  Past Medical History  Diagnosis Date  . Bilateral knee pain     OA  . Prediabetes   . Allergic rhinitis   . Hiatal hernia   . Heart murmur   . HLD (hyperlipidemia)   . HTN (hypertension)     Past Surgical History  Procedure Date  . Tonsillectomy   . Finger surgery   . Total knee arthroplasty 2012    left  . Cholecystectomy     History   Social History  . Marital Status: Married    Spouse Name: N/A    Number of Children: 0  . Years of Education: N/A   Occupational History  . retired Lobbyist)    Social History Main Topics  . Smoking status: Former Smoker    Quit date: 01/18/2010  . Smokeless tobacco: Not on file   Comment: Smoked for 25 years- quit September 2011.   Marland Kitchen Alcohol Use: Yes     Drinks 1-2 glass of wine per day.   . Drug Use: No  . Sexually Active: Not on file   Other Topics Concern  . Not on file   Social History Narrative   Lives in [place].  Retired- worked in Production designer, theatre/television/film.     Family History  Problem Relation Age of Onset  . Uterine cancer Mother   . Cancer Mother     uterine  . Coronary artery disease Father   . Coronary artery disease Paternal Grandfather   . Heart disease Paternal Grandfather     Current outpatient prescriptions:aspirin 81 MG tablet, Take 81 mg by mouth daily.  , Disp: , Rfl: ;  Melatonin 3 MG TABS, Take 1 tablet by mouth daily.  , Disp: , Rfl: ;  metoprolol (TOPROL XL) 50 MG 24 hr tablet, Take 50 mg by mouth daily.  , Disp: , Rfl: ;  Multiple Vitamins-Minerals (MULTIVITAL) tablet, Take 1 tablet by mouth daily.  , Disp: , Rfl:  olmesartan (BENICAR) 20 MG tablet, Take 1 tablet (20 mg total) by mouth daily., Disp: 30 tablet, Rfl: 11;  Omega-3 Fatty Acids (FISH OIL) 1000 MG CAPS, Take 2 capsules by mouth daily. , Disp: , Rfl: ;  pravastatin (PRAVACHOL) 40 MG tablet, Take 40 mg by mouth daily.  , Disp: , Rfl: ;  Glucosamine-Chondroitin 250-200 MG CAPS, Take by mouth daily.  , Disp: , Rfl:   Allergies  Allergen Reactions  . Diovan (Valsartan)  Cough  . Tagamet (Cimetidine) Other (See Comments)    irrittability   ROS: Denies fever, URI symptoms, cough.  Denies GI problems, edema, skin rash.  Had L TKR this year, doing well.  No chest pain, SOB or other concerns  PHYSICAL EXAM: BP 130/60  Pulse 68  Ht 6' (1.829 m)  Wt 241 lb (109.317 kg)  BMI 32.69 kg/m2 Pleasant male in no distress.  Chatty Neck: no lymphadenopathy or thyromegaly Heart:  2/6 SEM at RUSB Lungs: clear bilaterally Extremities: No edema Skin: no rash Psych: normal mood, affect, hygiene and grooming  ASSESSMENT/PLAN: 1. Essential hypertension, benign    controlled; will attempt to lower costs of meds  2. Mixed hyperlipidemia    improved per 11/2010 labs at [facility]   Has 35 tabs left of Benicar.  Wants to re-try the losartan (30 day supply [facility]--send with note to hold rx on file and not fill yet until pt calls).  Previously  took 100mg  (with the HCTZ) but wasn't on the beta blocker at the time.  Will Rx 100mg , with instructions to take 1/2 tablet.  If BP's rise >130/80, then he is to increase to full tablet  Change Toprol from XL to ER.  Will want to get from [facility]--he asks me to hold off sending rx now because he still has a lot at home.  He will call when new Rx needs to be sent to them. Patient is to check on if insurance has preference between ARB's  F/u on blood pressure within 2-3 weeks after changing medications (approx 2 months)

## 2010-12-20 NOTE — Patient Instructions (Signed)
Once you finish the Benicar prescription, you can pick up Losartan from [facility].  I will start you out at 1/2 tablet daily.  If your blood pressures rise >130/80, then you need to increase to full tablet.  If you get recurrent cough, then you can either check with your insurance company to see if they have a preferred medication in the ARB class (ie check price of Diovan), or check prices at the pharmacies.  Remember to call our office when your current Toprol XL supply is low so that we can send new prescription to [facility] for the less expensive medication

## 2011-01-22 ENCOUNTER — Telehealth: Payer: Self-pay | Admitting: Family Medicine

## 2011-01-22 DIAGNOSIS — I10 Essential (primary) hypertension: Secondary | ICD-10-CM

## 2011-01-22 MED ORDER — METOPROLOL TARTRATE 25 MG PO TABS: 25.0000 mg | ORAL_TABLET | Freq: Two times a day (BID) | ORAL | Status: DC

## 2011-01-22 NOTE — Telephone Encounter (Signed)
Pt called needs refill on Metrolprolol.  Pt is confused as to XR, XL or ER.  Pt not sure which you are switching him to and needs refill to [facility].  Pt would like a phone call with info [phone]

## 2011-01-22 NOTE — Telephone Encounter (Signed)
Pt informed that rx was sent to pharmacy and to take Metoprolol 25mg  BID.  Pt understood and agreed. CM,LPN

## 2011-01-22 NOTE — Telephone Encounter (Signed)
Please call patient and advise that new Rx was sent for twice daily metoprolol to [facility] (what was formerly [facility]).  He wanted to switch to the twice daily metoprolol (NOT XL or ER, per patient request because the XL/ER was too expensive--there was an error in my last note) to save money.  25mg  of the new metoprolol, taken TWICE daily, is equivalent to the 50mg  of the ER that he was previously taking once daily.  Thanks

## 2011-02-21 ENCOUNTER — Ambulatory Visit: Payer: Medicare Other | Admitting: Family Medicine

## 2011-03-07 ENCOUNTER — Encounter: Payer: Self-pay | Admitting: Family Medicine

## 2011-03-07 ENCOUNTER — Ambulatory Visit (INDEPENDENT_AMBULATORY_CARE_PROVIDER_SITE_OTHER): Payer: Medicare Other | Admitting: Family Medicine

## 2011-03-07 VITALS — BP 128/76 | HR 64 | Temp 98.3°F | Ht 72.0 in | Wt 244.0 lb

## 2011-03-07 DIAGNOSIS — R6882 Decreased libido: Secondary | ICD-10-CM

## 2011-03-07 DIAGNOSIS — N529 Male erectile dysfunction, unspecified: Secondary | ICD-10-CM

## 2011-03-07 DIAGNOSIS — I10 Essential (primary) hypertension: Secondary | ICD-10-CM

## 2011-03-07 DIAGNOSIS — J069 Acute upper respiratory infection, unspecified: Secondary | ICD-10-CM

## 2011-03-07 MED ORDER — LOSARTAN POTASSIUM 100 MG PO TABS: ORAL_TABLET | ORAL | Status: DC

## 2011-03-07 NOTE — Progress Notes (Signed)
Patient presents to follow up on HTN, as well as complaint of cough and sore throat.  Hypertension:  Switched from Benicar to Losartan.  Started with 1/2 tab but pressures too high so increased to full tablet as instructed.  Denies having any cough or side effect related to the medication.  Also changed from Toprol XL to BID regular metoprolol (for cost savings). BP's have been running 120's/70's before exercise, usually 100-100 systolic after exercise.  "my penis is shriveled up".  No problem with voiding, up 3 times/night, but size has shrunken down so much that it is hard to urinate without making a mess.  Also has decreased sex drive and erectile dysfunction.  He isn't interested in medication for ED because he can't afford those pills.  Not intimate with his wife.  He has a girlfriend, but isn't having sex with her--waiting until they are no longer married.  Denies fatigue, except [facility]--then he is tired later that day.  Patient would also like to lose weight.  Patient declines getting testosterone level checked until he checks if Medicare covers the test.  Also would like Rx for shingles vaccine to get at pharmacy (if affordable after checking with insurance).  Complains of cough since yesterday.  Also has runny nose, postnasal drip, sore throat.  Denies fevers.  Multiple sick contacts.  Has taken Advil OTC decongestant  Past Medical History  Diagnosis Date  . Bilateral knee pain     OA  . Prediabetes   . Allergic rhinitis   . Hiatal hernia   . Heart murmur   . HLD (hyperlipidemia)   . HTN (hypertension)   . Erectile dysfunction     Past Surgical History  Procedure Date  . Tonsillectomy   . Finger surgery   . Total knee arthroplasty 2012    left  . Cholecystectomy     History   Social History  . Marital Status: Married    Spouse Name: N/A    Number of Children: 0  . Years of Education: N/A   Occupational History  . retired Lobbyist)      Social History Main Topics  . Smoking status: Former Smoker    Quit date: 01/18/2010  . Smokeless tobacco: Not on file   Comment: Smoked for 25 years- quit September 2011.   Marland Kitchen Alcohol Use: Yes     Drinks 1-2 glass of wine per day.   . Drug Use: No  . Sexually Active: Not on file   Other Topics Concern  . Not on file   Social History Narrative   Lives in [place]. Retired- worked in Production designer, theatre/television/film.     Family History  Problem Relation Age of Onset  . Uterine cancer Mother   . Cancer Mother     uterine  . Coronary artery disease Father   . Coronary artery disease Paternal Grandfather   . Heart disease Paternal Grandfather     Current outpatient prescriptions:aspirin 81 MG tablet, Take 81 mg by mouth daily.  , Disp: , Rfl: ;  ibuprofen (ADVIL,MOTRIN) 200 MG tablet, Take 200 mg by mouth every 6 (six) hours as needed.  , Disp: , Rfl: ;  losartan (COZAAR) 100 MG tablet, 1 tablet orally once daily, Disp: 90 tablet, Rfl: 1;  metoprolol tartrate (LOPRESSOR) 25 MG tablet, Take 1 tablet (25 mg total) by mouth 2 (two) times daily., Disp: 180 tablet, Rfl: 1 Multiple Vitamins-Minerals (MULTIVITAL) tablet, Take 1 tablet by mouth daily.  ,  Disp: , Rfl: ;  Omega-3 Fatty Acids (FISH OIL) 1000 MG CAPS, Take 2 capsules by mouth daily. , Disp: , Rfl: ;  pravastatin (PRAVACHOL) 40 MG tablet, Take 40 mg by mouth daily.  , Disp: , Rfl: ;  Glucosamine-Chondroitin 250-200 MG CAPS, Take by mouth daily.  , Disp: , Rfl: ;  Melatonin 3 MG TABS, Take 1 tablet by mouth daily.  , Disp: , Rfl:   Allergies  Allergen Reactions  . Diovan (Valsartan) Cough  . Tagamet (Cimetidine) Other (See Comments)    irrittability   ROS:  Denies fevers, nausea, vomiting, diarrhea, skin rash.  See HPI for other concerns/symptoms  PHYSICAL EXAM: BP 128/76  Pulse 64  Temp(Src) 98.3 F (36.8 C) (Oral)  Ht 6' (1.829 m)  Wt 244 lb (110.678 kg)  BMI 33.09 kg/m2 Well developed, well nourished male  HEENT: PERRL, EOMI,  conjunctiva clear.  TM's and EAC's normal.  Nose: Mod mucosal edema, some yellow crust and clear mucus. Sinuses nontender. OP clear without erythema Neck: no lymphadenopathy or mass Heart: regular rate and rhythm Lungs: clear bilaterally Extremities: no edema Skin: no rash Psych: normal mood, affect, hygiene and grooming  ASSESSMENT/PLAN: 1. Essential hypertension, benign  losartan (COZAAR) 100 MG tablet   controlled; will attempt to lower costs of meds  2. URI (upper respiratory infection)    3. Erectile dysfunction    4. Decreased libido     Rx given for shingles vaccine; risks reviewed. Avoid decongestants.  Use Coricidin HBP or Claritin or Zyrtec to help with congestion, and use Mucinex DM or Robitussin DM as needed for cough. F/u if symptoms persist/worsen in the next week.

## 2011-03-07 NOTE — Patient Instructions (Addendum)
Check with your insurance regarding cost of shingles vaccine. You can also check whether it covers testosterone level (for diagnosis of decreased libido or erectile dysfunction)  Avoid decongestants.  Use Coricidin HBP or Claritin or Zyrtec to help with congestion, and use Mucinex DM or Robitussin DM as needed for cough

## 2011-04-17 ENCOUNTER — Encounter: Payer: Self-pay | Admitting: Family Medicine

## 2011-04-17 ENCOUNTER — Ambulatory Visit (INDEPENDENT_AMBULATORY_CARE_PROVIDER_SITE_OTHER): Payer: Medicare Other | Admitting: Family Medicine

## 2011-04-17 VITALS — BP 128/70 | HR 68 | Temp 99.0°F | Ht 72.0 in | Wt 251.0 lb

## 2011-04-17 DIAGNOSIS — H6690 Otitis media, unspecified, unspecified ear: Secondary | ICD-10-CM

## 2011-04-17 DIAGNOSIS — H6691 Otitis media, unspecified, right ear: Secondary | ICD-10-CM

## 2011-04-17 DIAGNOSIS — J019 Acute sinusitis, unspecified: Secondary | ICD-10-CM

## 2011-04-17 MED ORDER — AMOXICILLIN 875 MG PO TABS: 875.0000 mg | ORAL_TABLET | Freq: Two times a day (BID) | ORAL | Status: AC

## 2011-04-17 NOTE — Patient Instructions (Signed)
Take antibiotics as directed. Start taking loratidine once daily.  Avoid decongestants Use tylenol and/or advil if needed, for pain

## 2011-04-17 NOTE — Progress Notes (Signed)
Patient presents with complaint of L ear fullness and pain x 1 week. Feels "snap, crackle and pop", like some liquid is in the ear.  Was going to try OTC Swimmer's Ear, but it was too expensive.  Tried using alcohol in the ear once, without benefit.  Ear feels plugged, but denies any decrease in hearing.  Having sniffling, sneezing, nasal congestion.  Denies postnasal drip, sore throat.  Has some puffiness in L cheek, and occasional frontal headaches.  Occasional cough.  Denies fevers  Past Medical History  Diagnosis Date  . Bilateral knee pain     OA  . Prediabetes   . Allergic rhinitis   . Hiatal hernia   . Heart murmur   . HLD (hyperlipidemia)   . HTN (hypertension)   . Erectile dysfunction     Past Surgical History  Procedure Date  . Tonsillectomy   . Finger surgery   . Total knee arthroplasty 2012    left  . Cholecystectomy     History   Social History  . Marital Status: Married    Spouse Name: N/A    Number of Children: 0  . Years of Education: N/A   Occupational History  . retired Lobbyist)    Social History Main Topics  . Smoking status: Former Smoker    Quit date: 01/18/2010  . Smokeless tobacco: Not on file   Comment: Smoked for 25 years- quit September 2011.   Marland Kitchen Alcohol Use: Yes     Drinks 1-2 glass of wine per day.   . Drug Use: No  . Sexually Active: Not on file   Other Topics Concern  . Not on file   Social History Narrative   Lives in [place]. Retired- worked in Production designer, theatre/television/film.     Family History  Problem Relation Age of Onset  . Uterine cancer Mother   . Cancer Mother     uterine  . Coronary artery disease Father   . Coronary artery disease Paternal Grandfather   . Heart disease Paternal Grandfather    Current Outpatient Prescriptions on File Prior to Visit  Medication Sig Dispense Refill  . aspirin 81 MG tablet Take 81 mg by mouth daily.        Marland Kitchen ibuprofen (ADVIL,MOTRIN) 200 MG tablet Take 200 mg by mouth every 6 (six) hours as  needed.        Marland Kitchen losartan (COZAAR) 100 MG tablet 1 tablet orally once daily  90 tablet  1  . metoprolol tartrate (LOPRESSOR) 25 MG tablet Take 1 tablet (25 mg total) by mouth 2 (two) times daily.  180 tablet  1  . Multiple Vitamins-Minerals (MULTIVITAL) tablet Take 1 tablet by mouth daily.        . Omega-3 Fatty Acids (FISH OIL) 1000 MG CAPS Take 2 capsules by mouth daily.       . pravastatin (PRAVACHOL) 40 MG tablet Take 40 mg by mouth daily.        . Melatonin 3 MG TABS Take 1 tablet by mouth daily.          Allergies  Allergen Reactions  . Diovan (Valsartan) Cough  . Tagamet (Cimetidine) Other (See Comments)    irrittability   ROS:  Denies fevers, shortness of breath.  Got a splinter in his finger last night--he pulled it out, still a little sore.  Denies abdominal pain or other GI complaints; no other skin concerns.  PHYSICAL EXAM: BP 128/70  Pulse 68  Temp(Src) 99 F (37.2 C) (Oral)  Ht 6' (1.829 m)  Wt 251 lb (113.853 kg)  BMI 34.04 kg/m2 Well developed male in no distress HEENT:  PERRL, EOMI, conjunctiva clear.  Right TM and EAC normal.  L TM with some erythema in superior portion.  No significant effusion.  Nasal mucosa moderately edematous, mild erythema with white drainage.  + tenderness at L maxillary sinus.  OP clear without erythema Neck: no lymphadenopathy or thyromegaly  Heart: 2/6 SEM at RUSB  Lungs: clear bilaterally  Extremities: No edema  Skin: no rash  ASSESSMENT/PLAN: 1. Otitis media, right  amoxicillin (AMOXIL) 875 MG tablet  2. Sinusitis acute  amoxicillin (AMOXIL) 875 MG tablet   Avoid decongestants--antihistamine recommended.   Tylenol or advil prn pain F/u in a week if not improving

## 2011-05-16 ENCOUNTER — Telehealth: Payer: Self-pay | Admitting: Family Medicine

## 2011-05-16 MED ORDER — PRAVASTATIN SODIUM 40 MG PO TABS: 40.0000 mg | ORAL_TABLET | Freq: Every day | ORAL | Status: DC

## 2011-05-16 NOTE — Telephone Encounter (Signed)
Pt req refill for Pravastatin 40 mg [facility].    Pt will call pharmacy direct next time

## 2011-06-20 ENCOUNTER — Other Ambulatory Visit: Payer: Self-pay | Admitting: Family Medicine

## 2011-07-10 ENCOUNTER — Telehealth: Payer: Self-pay | Admitting: Internal Medicine

## 2011-07-10 DIAGNOSIS — I10 Essential (primary) hypertension: Secondary | ICD-10-CM

## 2011-07-10 MED ORDER — METOPROLOL TARTRATE 25 MG PO TABS: 25.0000 mg | ORAL_TABLET | Freq: Two times a day (BID) | ORAL | Status: DC

## 2011-07-10 NOTE — Telephone Encounter (Signed)
Called in metoprolol 25mg  BID #[id number] with 0 refills to [facility] per patient's request.

## 2011-07-22 ENCOUNTER — Telehealth: Payer: Self-pay | Admitting: Family Medicine

## 2011-07-22 MED ORDER — PRAVASTATIN SODIUM 40 MG PO TABS: 40.0000 mg | ORAL_TABLET | Freq: Every day | ORAL | Status: DC

## 2011-07-22 NOTE — Telephone Encounter (Signed)
Pt called states rx states need doctor authorization.  Pt wants refill for 90 days of Pravastating 40 mg  Qd  [facility] [phone]

## 2011-07-22 NOTE — Telephone Encounter (Signed)
What the bottle was to say was pt needs med check talked with wife and informed her he needs a med check sent in 90 days

## 2011-08-05 ENCOUNTER — Ambulatory Visit (INDEPENDENT_AMBULATORY_CARE_PROVIDER_SITE_OTHER): Payer: Medicare Other | Admitting: Family Medicine

## 2011-08-05 ENCOUNTER — Encounter: Payer: Self-pay | Admitting: Family Medicine

## 2011-08-05 VITALS — BP 120/82 | HR 76 | Ht 72.0 in | Wt 245.0 lb

## 2011-08-05 DIAGNOSIS — Z23 Encounter for immunization: Secondary | ICD-10-CM

## 2011-08-05 DIAGNOSIS — E782 Mixed hyperlipidemia: Secondary | ICD-10-CM

## 2011-08-05 DIAGNOSIS — I10 Essential (primary) hypertension: Secondary | ICD-10-CM

## 2011-08-05 DIAGNOSIS — Z125 Encounter for screening for malignant neoplasm of prostate: Secondary | ICD-10-CM

## 2011-08-05 LAB — COMPREHENSIVE METABOLIC PANEL
CO2: 24 mEq/L (ref 19–32)
Calcium: 9.5 mg/dL (ref 8.4–10.5)
Chloride: 105 mEq/L (ref 96–112)
Creat: 0.87 mg/dL (ref 0.50–1.35)
Glucose, Bld: 107 mg/dL — ABNORMAL HIGH (ref 70–99)
Total Bilirubin: 0.8 mg/dL (ref 0.3–1.2)
Total Protein: 7 g/dL (ref 6.0–8.3)

## 2011-08-05 LAB — LIPID PANEL
Cholesterol: 164 mg/dL (ref 0–200)
Total CHOL/HDL Ratio: 4.8 Ratio
Triglycerides: 150 mg/dL — ABNORMAL HIGH (ref <150)
VLDL: 30 mg/dL (ref 0–40)

## 2011-08-05 NOTE — Patient Instructions (Signed)
Your blood pressure is well controlled.  I would like to see you lose some weight.  Increase exercise to at least 30-60 minutes every day, ideally at least 5-6 days/week.  Cut back on alcohol to cut back on calories.

## 2011-08-05 NOTE — Progress Notes (Signed)
Patient presents for fasting med check.    Hypertension follow-up:  Blood pressures elsewhere are 107-138/72-82, mainly 120/75.  Denies chest pain, sometimes has some heartburn in center of chest, and some R shoulder pain, h/o bursitis.  He exercises 3 days/week and has no shortness of breath or chest pain.  Denies side effects of medications. Rarely can have very short-lived dizziness if stands too quickly.  Only has occasional sinus headache.  No further cough since he quit smoking.  Hyperlipidemia follow-up:  Patient doesn't particularly follow a low-fat, low cholesterol diet, as his wife does a lot of country cooking.  Compliant with medications and denies medication side effects.  Past Medical History  Diagnosis Date  . Bilateral knee pain     OA  . Prediabetes   . Allergic rhinitis   . Hiatal hernia   . Heart murmur   . HLD (hyperlipidemia)   . HTN (hypertension)   . Erectile dysfunction     Past Surgical History  Procedure Date  . Tonsillectomy   . Finger surgery   . Total knee arthroplasty 2012    left  . Cholecystectomy     History   Social History  . Marital Status: Married    Spouse Name: N/A    Number of Children: 0  . Years of Education: N/A   Occupational History  . retired Lobbyist)    Social History Main Topics  . Smoking status: Former Smoker    Quit date: 01/18/2010  . Smokeless tobacco: Never Used   Comment: Smoked for 25 years- quit September 2011.   Marland Kitchen Alcohol Use: Yes     Drinks 1-2 glass of wine per day.   . Drug Use: No  . Sexually Active: Not on file   Other Topics Concern  . Not on file   Social History Narrative   Lives in [place]. Retired- worked in Production designer, theatre/television/film.     Family History  Problem Relation Age of Onset  . Uterine cancer Mother   . Cancer Mother     uterine  . Coronary artery disease Father   . Coronary artery disease Paternal Grandfather   . Heart disease Paternal Grandfather     Current outpatient  prescriptions:aspirin 81 MG tablet, Take 81 mg by mouth daily.  , Disp: , Rfl: ;  ibuprofen (ADVIL,MOTRIN) 200 MG tablet, Take 200 mg by mouth every 6 (six) hours as needed.  , Disp: , Rfl: ;  losartan (COZAAR) 100 MG tablet, 1 tablet orally once daily, Disp: 90 tablet, Rfl: 1;  Melatonin 3 MG TABS, Take 1 tablet by mouth daily.  , Disp: , Rfl:  metoprolol tartrate (LOPRESSOR) 25 MG tablet, Take 1 tablet (25 mg total) by mouth 2 (two) times daily., Disp: 180 tablet, Rfl: 0;  Multiple Vitamins-Minerals (MULTIVITAL) tablet, Take 1 tablet by mouth daily.  , Disp: , Rfl: ;  Omega-3 Fatty Acids (FISH OIL) 1000 MG CAPS, Take 2 capsules by mouth daily. , Disp: , Rfl: ;  pravastatin (PRAVACHOL) 40 MG tablet, Take 1 tablet (40 mg total) by mouth daily., Disp: 90 tablet, Rfl: 0 GLUCOSAMINE HCL PO, Take 2 tablets by mouth daily.  , Disp: , Rfl:   Allergies  Allergen Reactions  . Diovan (Valsartan) Cough  . Tagamet (Cimetidine) Other (See Comments)    irrittability   ROS:  Denies fevers, URI symptoms, cough, shortness of breath, nausea, vomiting, stool changes, joint pains, skin rashes, depression , or other concerns.  See HPI  PHYSICAL EXAM: BP  120/82  Pulse 76  Ht 6' (1.829 m)  Wt 245 lb (111.131 kg)  BMI 33.23 kg/m2 Well developed, overweight male in no distress HEENT: PERRL, conjunctiva clear Neck: no lymphadenopathy or mass, no carotid bruit Heart: regular rate and rhythm without murmur Lungs: clear bilaterally  Extremities: no edema, 2+ pulses Skin: no rash  Psych: normal mood, affect, hygiene and grooming  ASSESSMENT/PLAN:  1. Mixed hyperlipidemia  Comprehensive metabolic panel, Lipid panel  2. Essential hypertension, benign  Comprehensive metabolic panel  3. Special screening for malignant neoplasm of prostate  PSA, Medicare  4. Need for Tdap vaccination  Tdap vaccine greater than or equal to 7yo IM   HTN well controlled Hyperlipidemia--reviewed diet.  Will advise of results when  available, and change therapy if indicated.  Cut back on alcohol to help with weight loss  Declines testosterone check, as he doesn't know what it would cost. (has been offered in past due to erectile problems)  Declines shingles vaccine due to $92 copay  CPE 6 months Briefly reviewed risks/benefits of PSA screening, and wishes to proceed today.

## 2011-09-05 ENCOUNTER — Encounter: Payer: Medicare Other | Admitting: Family Medicine

## 2011-09-19 ENCOUNTER — Other Ambulatory Visit: Payer: Self-pay | Admitting: Family Medicine

## 2011-10-15 ENCOUNTER — Telehealth: Payer: Self-pay | Admitting: Family Medicine

## 2011-10-15 DIAGNOSIS — I10 Essential (primary) hypertension: Secondary | ICD-10-CM

## 2011-10-15 MED ORDER — PRAVASTATIN SODIUM 40 MG PO TABS: 40.0000 mg | ORAL_TABLET | Freq: Every day | ORAL | Status: DC

## 2011-10-15 MED ORDER — METOPROLOL TARTRATE 25 MG PO TABS: 25.0000 mg | ORAL_TABLET | Freq: Two times a day (BID) | ORAL | Status: DC

## 2011-10-15 MED ORDER — LOSARTAN POTASSIUM 100 MG PO TABS: 50.0000 mg | ORAL_TABLET | Freq: Two times a day (BID) | ORAL | Status: DC

## 2011-10-15 NOTE — Telephone Encounter (Signed)
See Dr. Jon Billings ast note. He needs a followup appointment.

## 2011-10-17 ENCOUNTER — Other Ambulatory Visit: Payer: Self-pay | Admitting: Family Medicine

## 2012-01-07 ENCOUNTER — Other Ambulatory Visit: Payer: Self-pay | Admitting: Family Medicine

## 2012-01-21 ENCOUNTER — Telehealth: Payer: Self-pay | Admitting: Family Medicine

## 2012-01-21 DIAGNOSIS — I10 Essential (primary) hypertension: Secondary | ICD-10-CM

## 2012-01-21 MED ORDER — METOPROLOL TARTRATE 25 MG PO TABS: 25.0000 mg | ORAL_TABLET | Freq: Two times a day (BID) | ORAL | Status: DC

## 2012-01-21 NOTE — Telephone Encounter (Signed)
I sent a 90 day supply of his BP medications to [facility] per patients request. CLS

## 2012-02-06 ENCOUNTER — Encounter: Payer: Self-pay | Admitting: Family Medicine

## 2012-02-06 ENCOUNTER — Ambulatory Visit (INDEPENDENT_AMBULATORY_CARE_PROVIDER_SITE_OTHER): Payer: Medicare Other | Admitting: Family Medicine

## 2012-02-06 VITALS — BP 122/70 | HR 64 | Ht 70.0 in | Wt 247.0 lb

## 2012-02-06 DIAGNOSIS — R5381 Other malaise: Secondary | ICD-10-CM

## 2012-02-06 DIAGNOSIS — R6882 Decreased libido: Secondary | ICD-10-CM

## 2012-02-06 DIAGNOSIS — R5383 Other fatigue: Secondary | ICD-10-CM

## 2012-02-06 DIAGNOSIS — N529 Male erectile dysfunction, unspecified: Secondary | ICD-10-CM

## 2012-02-06 DIAGNOSIS — E782 Mixed hyperlipidemia: Secondary | ICD-10-CM

## 2012-02-06 DIAGNOSIS — I10 Essential (primary) hypertension: Secondary | ICD-10-CM

## 2012-02-06 DIAGNOSIS — Z0000 Encounter for general adult medical examination without abnormal findings: Secondary | ICD-10-CM

## 2012-02-06 DIAGNOSIS — Z23 Encounter for immunization: Secondary | ICD-10-CM

## 2012-02-06 DIAGNOSIS — Z1211 Encounter for screening for malignant neoplasm of colon: Secondary | ICD-10-CM

## 2012-02-06 DIAGNOSIS — R7301 Impaired fasting glucose: Secondary | ICD-10-CM | POA: Insufficient documentation

## 2012-02-06 LAB — COMPREHENSIVE METABOLIC PANEL
ALT: 37 U/L (ref 0–53)
AST: 28 U/L (ref 0–37)
CO2: 24 mEq/L (ref 19–32)
Creat: 0.85 mg/dL (ref 0.50–1.35)
Total Bilirubin: 0.6 mg/dL (ref 0.3–1.2)

## 2012-02-06 LAB — LIPID PANEL
HDL: 35 mg/dL — ABNORMAL LOW (ref >39)
LDL Cholesterol: 86 mg/dL (ref 0–99)
Total CHOL/HDL Ratio: 4 Ratio
Triglycerides: 92 mg/dL (ref <150)
VLDL: 18 mg/dL (ref 0–40)

## 2012-02-06 LAB — CBC WITH DIFFERENTIAL/PLATELET
Basophils Absolute: 0.1 10*3/uL (ref 0.0–0.1)
Eosinophils Relative: 3 % (ref 0–5)
HCT: 44.2 % (ref 39.0–52.0)
Lymphocytes Relative: 25 % (ref 12–46)
Lymphs Abs: 1.2 10*3/uL (ref 0.7–4.0)
MCV: 87.9 fL (ref 78.0–100.0)
Monocytes Absolute: 0.6 10*3/uL (ref 0.1–1.0)
RDW: 13.9 % (ref 11.5–15.5)
WBC: 4.6 10*3/uL (ref 4.0–10.5)

## 2012-02-06 LAB — POCT URINALYSIS DIPSTICK
Glucose, UA: NEGATIVE
Nitrite, UA: NEGATIVE
Urobilinogen, UA: NEGATIVE

## 2012-02-06 LAB — HEMOGLOBIN A1C: Mean Plasma Glucose: 126 mg/dL — ABNORMAL HIGH (ref <117)

## 2012-02-06 NOTE — Patient Instructions (Addendum)
HEALTH MAINTENANCE RECOMMENDATIONS:  It is recommended that you get at least 30 minutes of aerobic exercise at least 5 days/week (for weight loss, you may need as much as 60-90 minutes). This can be any activity that gets your heart rate up. This can be divided in 10-15 minute intervals if needed, but try and build up your endurance at least once a week.  Weight bearing exercise is also recommended twice weekly.  Eat a healthy diet with lots of vegetables, fruits and fiber.  "Colorful" foods have a lot of vitamins (ie green vegetables, tomatoes, red peppers, etc).  Limit sweet tea, regular sodas and alcoholic beverages, all of which has a lot of calories and sugar.  Up to 2 alcoholic drinks daily may be beneficial for men (unless trying to lose weight, watch sugars).  Drink a lot of water.  Sunscreen of at least SPF 30 should be used on all sun-exposed parts of the skin when outside between the hours of 10 am and 4 pm (not just when at beach or pool, but even with exercise, golf, tennis, and yard work!)  Use a sunscreen that says "broad spectrum" so it covers both UVA and UVB rays, and make sure to reapply every 1-2 hours.  Remember to change the batteries in your smoke detectors when changing your clock times in the spring and fall.  Use your seat belt every time you are in a car, and please drive safely and not be distracted with cell phones and texting while driving.  Try cutting back on your caffeine intake--this should help with your urinary frequency as well as heartburn. Consider saw palmetto for prostate symptoms.  Shingles vaccine is recommended, when/if it becomes affordable for you (you need to get through the pharmacy, not our office)

## 2012-02-06 NOTE — Progress Notes (Signed)
Chief Complaint  Patient presents with  . Annual Exam    fasting annual exam. Would like to speak to you about genital warts. Also complains of frequent urination and urgency during the night and day. No vision done as he recently had eye exam. Also got his flu shot this past weekend at the pharmacy.   Gary Carroll is a 66 y.o. male who presents for a complete physical.  He has the following concerns:  He wakes up 3-5 times/night to void, and also has urinary frequency during the day.  Denies excessive thirst.  He drinks 2-4 caffeinated beverages during the day and 1-2 glasses of wine each night.  Rare soft drinks.  Denies any urinary hesitancy, only mild weakened stream, no recent change.  Slight dribble at the end, feels like his bladder empties completely.  He is asking about genital warts--his wife had them about 20 years ago, and accused him of giving them to her.  He hasn't had sex with her since then, in 20 years.  He denies ever having had any warts. He suffers from erectile dysfunction, but isn't in a sexual relationship currently.  Not interested in medications at this time.  He is also here for a med check for hypertension and hyperlipidemia.  Hypertension follow-up:  Blood pressures elsewhere are 120's/60, sometimes as low as 109/60 after exercise.  Denies dizziness, headaches, chest pain, edema.  Complaining of some erectile dysfunction from the medications.  Hyperlipidemia follow-up:  Patient tries to be good with diet, but his wife cooks, and he eats red meat 3x/week. Rare eggs, doesn't cook in butter. Compliant with medications and denies medication side effects.  He increased his fish oil to 4 capsules daily.  He states that he recently refilled his meds, and doesn't need any refills today (although per computer, looks like he should need refills on BP meds).  Health Maintenance: Immunization History  Administered Date(s) Administered  . Influenza Split 02/11/2011,  02/01/2012  . Tdap 08/05/2011  no record of pneumovax given per chart (not given at 65 yo PE). Last colonoscopy: over 10-15 years ago at [facility] Last PSA: 07/2101 Dentist: went 2 months ago, doesn't go regularly Ophtho: went over the summer Exercise: goes to [facility] for 2 hours 3x/week.  Past Medical History  Diagnosis Date  . Bilateral knee pain     OA  . Prediabetes   . Allergic rhinitis   . Hiatal hernia   . Heart murmur   . HLD (hyperlipidemia)   . HTN (hypertension)   . Erectile dysfunction     Past Surgical History  Procedure Date  . Tonsillectomy   . Finger surgery   . Total knee arthroplasty 2012    left (Dr. Valentina Gu)  . Cholecystectomy     History   Social History  . Marital Status: Married    Spouse Name: N/A    Number of Children: 0  . Years of Education: N/A   Occupational History  . retired Lobbyist)    Social History Main Topics  . Smoking status: Former Smoker    Types: Cigarettes    Quit date: 01/18/2010  . Smokeless tobacco: Never Used   Comment: Smoked for 25 years- quit September 2011.  Passive exposure from wife  . Alcohol Use: Yes     Drinks 1-2 glass of wine per day.   . Drug Use: No  . Sexually Active: Not Currently   Other Topics Concern  . Not on file   Social History  Narrative   Lives in [place]. Retired- worked in Production designer, theatre/television/film.     Family History  Problem Relation Age of Onset  . Uterine cancer Mother   . Cancer Mother     uterine  . Coronary artery disease Father   . Coronary artery disease Paternal Grandfather   . Heart disease Paternal Grandfather     Current outpatient prescriptions:aspirin 81 MG tablet, Take 81 mg by mouth daily.  , Disp: , Rfl: ;  losartan (COZAAR) 100 MG tablet, Take 100 mg by mouth daily., Disp: , Rfl: ;  metoprolol tartrate (LOPRESSOR) 25 MG tablet, Take 1 tablet (25 mg total) by mouth 2 (two) times daily., Disp: 180 tablet, Rfl: 1;  Multiple Vitamins-Minerals (MULTIVITAL) tablet, Take 1 tablet  by mouth daily.  , Disp: , Rfl:  Omega-3 Fatty Acids (FISH OIL) 1200 MG CAPS, Take 2 capsules by mouth 2 (two) times daily., Disp: , Rfl: ;  pravastatin (PRAVACHOL) 40 MG tablet, Take 1 tablet (40 mg total) by mouth daily., Disp: 90 tablet, Rfl: 0;  DISCONTD: losartan (COZAAR) 100 MG tablet, Take 0.5 tablets (50 mg total) by mouth 2 (two) times daily., Disp: 90 tablet, Rfl: 0 ibuprofen (ADVIL,MOTRIN) 200 MG tablet, Take 200 mg by mouth every 6 (six) hours as needed.  , Disp: , Rfl: ;  DISCONTD: losartan (COZAAR) 100 MG tablet, TAKE ONE TABLET BY MOUTH ONCE DAILY., Disp: 90 tablet, Rfl: 0;  DISCONTD: pravastatin (PRAVACHOL) 40 MG tablet, Take 1 tablet (40 mg total) by mouth daily., Disp: 90 tablet, Rfl: 0  Allergies  Allergen Reactions  . Diovan (Valsartan) Cough  . Tagamet (Cimetidine) Other (See Comments)    irrittability   ROS:  The patient denies anorexia, fever, weight changes, headaches,  vision loss, decreased hearing, ear pain, hoarseness, chest pain, palpitations, dizziness, syncope, dyspnea on exertion, cough, swelling, nausea, vomiting, diarrhea, abdominal pain, melena, hematochezia, hematuria, incontinence, weakened urine stream, dysuria, genital lesions, joint pains, numbness, tingling, weakness, tremor, suspicious skin lesions, depression, anxiety, abnormal bleeding/bruising, or enlarged lymph nodes. Some fatigue after exercise, lasting until the next day, but otherwise energy is good. +heartburn 2x/week; occasional constipation. Some restless legs, only when sitting.  Legs fine while sleeping, using soap in his bed. +ED  PHYSICAL EXAM: BP 122/70  Pulse 64  Ht 5\' 10"  (1.778 m)  Wt 247 lb (112.038 kg)  BMI 35.44 kg/m2  General Appearance:    Alert, cooperative, no distress, appears stated age  Head:    Normocephalic, without obvious abnormality, atraumatic  Eyes:    PERRL, conjunctiva/corneas clear, EOM's intact, fundi    benign  Ears:    Normal TM's and external ear canals    Nose:   Nares normal, mucosa normal, no drainage or sinus   tenderness  Throat:   Lips, mucosa, and tongue normal; teeth and gums normal  Neck:   Supple, no lymphadenopathy;  thyroid:  no   enlargement/tenderness/nodules; no carotid   bruit or JVD  Back:    Spine nontender, no curvature, ROM normal, no CVA     tenderness  Lungs:     Clear to auscultation bilaterally without wheezes, rales or     ronchi; respirations unlabored  Chest Wall:    No tenderness or deformity   Heart:    Regular rate and rhythm, S1 and S2 normal, no rub   or gallop, /6 SEM at RUSB   Breast Exam:    No chest wall tenderness, masses or gynecomastia  Abdomen:  Soft, non-tender, nondistended, normoactive bowel sounds,    no masses, no hepatosplenomegaly  Genitalia:    Normal male external genitalia without lesions, circumsized.  Testicles without masses.  No inguinal hernias.  Rectal:    Normal sphincter tone, no masses or tenderness; guaiac negative stool.  Prostate smooth, no nodules, mildly enlarged.  Extremities:   No clubbing, cyanosis or edema  Pulses:   2+ and symmetric all extremities  Skin:   Skin color, texture, turgor normal, no rashes or lesions.  Stasis changes to BLE noted.  Dermatofibroma LLE (unchanged x 20 years per pt).  Lymph nodes:   Cervical, supraclavicular, and axillary nodes normal  Neurologic:   CNII-XII intact, normal strength, sensation and gait; reflexes 2+ and symmetric throughout          Psych:   Normal mood, affect, hygiene and grooming.      ASSESSMENT/PLAN:  1. Routine general medical examination at a health care facility  POCT Urinalysis Dipstick  2. Mixed hyperlipidemia  Lipid panel, Comprehensive metabolic panel  3. Essential hypertension, benign  Comprehensive metabolic panel  4. Decreased libido    5. Erectile dysfunction    6. Impaired fasting glucose  Comprehensive metabolic panel, Hemoglobin A1c  7. Other malaise and fatigue  CBC with Differential, TSH  8. Need for  pneumococcal vaccination  Pneumococcal polysaccharide vaccine 23-valent greater than or equal to 67yo subcutaneous/IM   HTN--well controlled Lipids--labs due today. Urinary frequency.--mild BPH.  Most likely symptoms are related to bladder rather than prostate.  Encouraged behavioral changes (decrease caffeine intake, fluid in the evening, alcohol). Consider saw palmetto trial  Discussed getting at least 30 minutes of aerobic activity at least 5 days/week; proper sunscreen use reviewed; healthy diet and alcohol recommendations (less than or equal to 2 drinks/day) reviewed; regular seatbelt use; changing batteries in smoke detectors. Self-testicular exams. Immunization recommendations discussed (see below).  Colonoscopy recommendations reviewed, past due.  Pneumovax today. Zostavax recommended--he states he cannot afford the $95 copay at this time.  Declines testosterone level check

## 2012-02-10 ENCOUNTER — Encounter: Payer: Self-pay | Admitting: Gastroenterology

## 2012-02-18 DIAGNOSIS — K5790 Diverticulosis of intestine, part unspecified, without perforation or abscess without bleeding: Secondary | ICD-10-CM

## 2012-02-18 HISTORY — PX: COLONOSCOPY: SHX174

## 2012-02-18 HISTORY — DX: Diverticulosis of intestine, part unspecified, without perforation or abscess without bleeding: K57.90

## 2012-03-04 ENCOUNTER — Ambulatory Visit (AMBULATORY_SURGERY_CENTER): Payer: Medicare Other | Admitting: *Deleted

## 2012-03-04 ENCOUNTER — Telehealth: Payer: Self-pay | Admitting: Gastroenterology

## 2012-03-04 VITALS — Ht 71.5 in | Wt 249.8 lb

## 2012-03-04 DIAGNOSIS — Z1211 Encounter for screening for malignant neoplasm of colon: Secondary | ICD-10-CM

## 2012-03-04 MED ORDER — NA SULFATE-K SULFATE-MG SULF 17.5-3.13-1.6 GM/177ML PO SOLN: ORAL | Status: DC

## 2012-03-04 NOTE — Telephone Encounter (Signed)
Talked with pt; agrees to get prep as ordered.

## 2012-03-04 NOTE — Progress Notes (Signed)
No allergies to eggs or soy products  Writer fixed prep instructions- pt knows to be NPO at 5:00 a.m. On 03-18-12

## 2012-03-09 ENCOUNTER — Telehealth: Payer: Self-pay | Admitting: Gastroenterology

## 2012-03-09 NOTE — Telephone Encounter (Signed)
Pt says he will have to cancel procedure because he cannot afford prep.  Pt will come by office to pick up coupon for free MoviPrep.

## 2012-03-11 ENCOUNTER — Telehealth: Payer: Self-pay | Admitting: Gastroenterology

## 2012-03-11 NOTE — Telephone Encounter (Signed)
Patient to pick up SuPrep sample kit today

## 2012-03-18 ENCOUNTER — Encounter: Payer: Self-pay | Admitting: Gastroenterology

## 2012-03-18 ENCOUNTER — Ambulatory Visit (AMBULATORY_SURGERY_CENTER): Payer: Medicare Other | Admitting: Gastroenterology

## 2012-03-18 VITALS — BP 126/75 | HR 66 | Temp 98.4°F | Resp 16 | Ht 71.5 in | Wt 249.0 lb

## 2012-03-18 DIAGNOSIS — K5730 Diverticulosis of large intestine without perforation or abscess without bleeding: Secondary | ICD-10-CM

## 2012-03-18 DIAGNOSIS — Z1211 Encounter for screening for malignant neoplasm of colon: Secondary | ICD-10-CM

## 2012-03-18 MED ORDER — SODIUM CHLORIDE 0.9 % IV SOLN: 500.0000 mL | INTRAVENOUS | Status: DC

## 2012-03-18 NOTE — Op Note (Signed)
[facility] [address] [place], [address]   COLONOSCOPY PROCEDURE REPORT  PATIENT: Gary Carroll, Gary Carroll  MR#: [id number] BIRTHDATE: 09-04-1945 , 65  yrs. old GENDER: Male ENDOSCOPIST: Louis Meckel, MD REFERRED Edison Simon, M.D. PROCEDURE DATE:  03/18/2012 PROCEDURE:   Colonoscopy, diagnostic ASA CLASS:   Class II INDICATIONS:average risk screening. MEDICATIONS: MAC sedation, administered by CRNA and Propofol (Diprivan) 280 mg IV  DESCRIPTION OF PROCEDURE:   After the risks benefits and alternatives of the procedure were thoroughly explained, informed consent was obtained.  A digital rectal exam revealed no abnormalities of the rectum.   The LB CF-[id number]  endoscope was introduced through the anus and advanced to the cecum, which was identified by the ileocecal valve. No adverse events experienced.   Limited by poor preparation.  There was a moderate amount of retained liquid stool. The quality of the prep was Suprep fair  The instrument was then slowly withdrawn as the colon was fully examined.      COLON FINDINGS: Moderate diverticulosis was noted in the sigmoid colon.   Mild diverticulosis was noted in the transverse colon and ascending colon.  Retroflexed views revealed no abnormalities. The time to cecum=3 minutes 25 seconds.  Withdrawal time=11 minutes 15 seconds.  The scope was withdrawn and the procedure completed. COMPLICATIONS: There were no complications.  ENDOSCOPIC IMPRESSION: 1.   Moderate diverticulosis was noted in the sigmoid colon 2.   Mild diverticulosis was noted in the transverse colon and ascending colon  RECOMMENDATIONS: Continue current colorectal screening recommendations for "routine risk" patients with a repeat colonoscopy in 10 years.   eSigned:  Louis Meckel, MD 03/18/2012 9:07 AM   cc:

## 2012-03-18 NOTE — Patient Instructions (Addendum)
YOU HAD AN ENDOSCOPIC PROCEDURE TODAY AT THE [facility]: Refer to the procedure report that was given to you for any specific questions about what was found during the examination.  If the procedure report does not answer your questions, please call your gastroenterologist to clarify.  If you requested that your care partner not be given the details of your procedure findings, then the procedure report has been included in a sealed envelope for you to review at your convenience later.  YOU SHOULD EXPECT: Some feelings of bloating in the abdomen. Passage of more gas than usual.  Walking can help get rid of the air that was put into your GI tract during the procedure and reduce the bloating. If you had a lower endoscopy (such as a colonoscopy or flexible sigmoidoscopy) you may notice spotting of blood in your stool or on the toilet paper. If you underwent a bowel prep for your procedure, then you may not have a normal bowel movement for a few days.  DIET: Your first meal following the procedure should be a light meal and then it is ok to progress to your normal diet.  A half-sandwich or bowl of soup is an example of a good first meal.  Heavy or fried foods are harder to digest and may make you feel nauseous or bloated.  Likewise meals heavy in dairy and vegetables can cause extra gas to form and this can also increase the bloating.  Drink plenty of fluids but you should avoid alcoholic beverages for 24 hours.  ACTIVITY: Your care partner should take you home directly after the procedure.  You should plan to take it easy, moving slowly for the rest of the day.  You can resume normal activity the day after the procedure however you should NOT DRIVE or use heavy machinery for 24 hours (because of the sedation medicines used during the test).    SYMPTOMS TO REPORT IMMEDIATELY: A gastroenterologist can be reached at any hour.  During normal business hours, 8:30 AM to 5:00 PM Monday through Friday,  call [phone].  After hours and on weekends, please call the GI answering service at [phone] who will take a message and have the physician on call contact you.   Following lower endoscopy (colonoscopy or flexible sigmoidoscopy):  Excessive amounts of blood in the stool  Significant tenderness or worsening of abdominal pains  Swelling of the abdomen that is new, acute  Fever of 100F or higher  Fever of 100F or higher  Black, tarry-looking stools  FOLLOW UP: Our staff will call the home number listed on your records the next business day following your procedure to check on you and address any questions or concerns that you may have at that time regarding the information given to you following your procedure. This is a courtesy call and so if there is no answer at the home number and we have not heard from you through the emergency physician on call, we will assume that you have returned to your regular daily activities without incident.  SIGNATURES/CONFIDENTIALITY: You and/or your care partner have signed paperwork which will be entered into your electronic medical record.  These signatures attest to the fact that that the information above on your After Visit Summary has been reviewed and is understood.  Full responsibility of the confidentiality of this discharge information lies with you and/or your care-partner.   Ok to resume your normal medications  Please review information on diverticulosis and high fiber  diets  Follow up colonoscopy in 10 years

## 2012-03-18 NOTE — Progress Notes (Signed)
Pt denies allergy to eggs or soy. ewm

## 2012-03-18 NOTE — Progress Notes (Signed)
0920-Pt c/o abd. Pain, rating as a "7", knees to chest and told to pass air.  Pt passing air and abd distended 0925- Pt turned to right side, knees to chest, passing air   0928- Pt given Levsin SL 0.125mg  2 tablets to help with gas pain  0940-Pt to restroom and passed air and fluid.  Denies further pain, only c/o some "soreness" to RLQ  Patient did not experience any of the following events: a burn prior to discharge; a fall within the facility; wrong site/side/patient/procedure/implant event; or a hospital transfer or hospital admission upon discharge from the facility. [phone]) Patient did not have preoperative order for IV antibiotic SSI prophylaxis. [phone])

## 2012-03-19 ENCOUNTER — Telehealth: Payer: Self-pay | Admitting: *Deleted

## 2012-03-19 NOTE — Telephone Encounter (Signed)
  Follow up Call-  Call back number 03/18/2012  Post procedure Call Back phone  # [phone]  Permission to leave phone message Yes     Patient questions:  Do you have a fever, pain , or abdominal swelling? no Pain Score  0 *  Have you tolerated food without any problems? yes  Have you been able to return to your normal activities? yes  Do you have any questions about your discharge instructions: Diet   no Medications  no Follow up visit  no  Do you have questions or concerns about your Care? no  Actions: * If pain score is 4 or above: No action needed, pain <4.

## 2012-03-23 ENCOUNTER — Encounter: Payer: Self-pay | Admitting: Family Medicine

## 2012-03-23 DIAGNOSIS — K5790 Diverticulosis of intestine, part unspecified, without perforation or abscess without bleeding: Secondary | ICD-10-CM | POA: Insufficient documentation

## 2012-04-06 ENCOUNTER — Other Ambulatory Visit: Payer: Self-pay | Admitting: Family Medicine

## 2012-04-21 ENCOUNTER — Other Ambulatory Visit: Payer: Self-pay | Admitting: Family Medicine

## 2012-06-08 ENCOUNTER — Telehealth: Payer: Self-pay | Admitting: Family Medicine

## 2012-06-09 ENCOUNTER — Other Ambulatory Visit: Payer: Self-pay | Admitting: *Deleted

## 2012-06-09 DIAGNOSIS — E785 Hyperlipidemia, unspecified: Secondary | ICD-10-CM

## 2012-06-09 DIAGNOSIS — I10 Essential (primary) hypertension: Secondary | ICD-10-CM

## 2012-06-09 MED ORDER — LOSARTAN POTASSIUM 100 MG PO TABS: 100.0000 mg | ORAL_TABLET | Freq: Every day | ORAL | Status: DC

## 2012-06-09 MED ORDER — METOPROLOL TARTRATE 25 MG PO TABS: 25.0000 mg | ORAL_TABLET | Freq: Two times a day (BID) | ORAL | Status: DC

## 2012-06-09 NOTE — Telephone Encounter (Signed)
Done

## 2012-06-10 NOTE — Telephone Encounter (Signed)
DONE

## 2012-06-11 ENCOUNTER — Telehealth: Payer: Self-pay | Admitting: Family Medicine

## 2012-06-12 NOTE — Telephone Encounter (Signed)
Faxed records to [facility] [phone] °

## 2012-07-06 ENCOUNTER — Encounter: Payer: Self-pay | Admitting: *Deleted

## 2012-07-06 ENCOUNTER — Telehealth: Payer: Self-pay | Admitting: Family Medicine

## 2012-07-06 DIAGNOSIS — E785 Hyperlipidemia, unspecified: Secondary | ICD-10-CM

## 2012-07-06 MED ORDER — PRAVASTATIN SODIUM 40 MG PO TABS: 40.0000 mg | ORAL_TABLET | Freq: Every day | ORAL | Status: DC

## 2012-07-06 NOTE — Telephone Encounter (Signed)
Done and pt scheduled appt for 07/22/12.

## 2012-07-22 ENCOUNTER — Ambulatory Visit (INDEPENDENT_AMBULATORY_CARE_PROVIDER_SITE_OTHER): Payer: Medicare Other | Admitting: Family Medicine

## 2012-07-22 ENCOUNTER — Encounter: Payer: Self-pay | Admitting: Family Medicine

## 2012-07-22 VITALS — BP 120/66 | HR 80 | Ht 70.0 in | Wt 249.0 lb

## 2012-07-22 DIAGNOSIS — R350 Frequency of micturition: Secondary | ICD-10-CM

## 2012-07-22 DIAGNOSIS — I10 Essential (primary) hypertension: Secondary | ICD-10-CM

## 2012-07-22 DIAGNOSIS — R7301 Impaired fasting glucose: Secondary | ICD-10-CM

## 2012-07-22 DIAGNOSIS — E782 Mixed hyperlipidemia: Secondary | ICD-10-CM

## 2012-07-22 LAB — POCT URINALYSIS DIPSTICK
Ketones, UA: NEGATIVE
Protein, UA: NEGATIVE
Spec Grav, UA: 1.02

## 2012-07-22 LAB — POCT GLYCOSYLATED HEMOGLOBIN (HGB A1C): Hemoglobin A1C: 5.6

## 2012-07-22 MED ORDER — OXYBUTYNIN CHLORIDE ER 5 MG PO TB24: ORAL_TABLET | ORAL | Status: DC

## 2012-07-22 NOTE — Progress Notes (Signed)
Chief Complaint  Patient presents with  . Follow-up    follow up, needs A1c today.   Hypertension follow-up:  Blood pressures are no longer being checked.  He moved to [place], but that Y doesn't have BP cuff.  He bought a monitor, but hasn't started using it yet.  Denies dizziness, headaches, chest pain.  Denies side effects of medications.  Hyperlipidemia follow-up:  Patient is reportedly following a low-fat, low cholesterol diet.  Compliant with medications and denies medication side effects.  His wife left him, so he is now cooking for himself, diet has changed.  Impaired fasting glucose:  Due for A1c today  Has been having urinary frequency during the day and at night.  Saw palmetto isn't helping.  Having urinary urgency and frequency.  Sometimes he has trouble voiding, but then will have urge again shortly thereafter.  Urinary stream has been strong, intermittently is weak.  Denies dysuria.  Leg cramps at night.  Using some OTC medication which seems to help--he doesn't recall what it is (not quinine), only uses it prn.  Also uses white soap under the sheets.  Past Medical History  Diagnosis Date  . Bilateral knee pain     OA  . Prediabetes   . Allergic rhinitis   . Hiatal hernia   . Heart murmur   . HLD (hyperlipidemia)   . HTN (hypertension)   . Erectile dysfunction   . Arthritis   . Asthma     seasonal per pt  . GERD (gastroesophageal reflux disease)   . Diverticulosis 02/2012    seen on colonoscopy   Past Surgical History  Procedure Laterality Date  . Tonsillectomy    . Finger surgery    . Total knee arthroplasty  2012    left (Dr. Valentina Gu)  . Cholecystectomy    . Colonoscopy  02/2012    diverticulosis; Dr. Arlyce Dice   History   Social History  . Marital Status: Married    Spouse Name: N/A    Number of Children: 0  . Years of Education: N/A   Occupational History  . retired Lobbyist)    Social History Main Topics  . Smoking status: Former Smoker   Types: Cigarettes    Quit date: 01/18/2010  . Smokeless tobacco: Never Used     Comment: Smoked for 25 years- quit September 2011.  Passive exposure from wife  . Alcohol Use: 0.0 oz/week    7-14 Cans of beer per week     Comment: drinks a few beers daily  . Drug Use: No  . Sexually Active: Not Currently   Other Topics Concern  . Not on file   Social History Narrative   Separated from wife (2014) and moved to [place]. Retired- worked in Production designer, theatre/television/film.    Current Outpatient Prescriptions on File Prior to Visit  Medication Sig Dispense Refill  . aspirin 81 MG tablet Take 81 mg by mouth daily.        Marland Kitchen losartan (COZAAR) 100 MG tablet TAKE ONE TABLET BY MOUTH EVERY DAY  90 tablet  0  . metoprolol tartrate (LOPRESSOR) 25 MG tablet Take 1 tablet (25 mg total) by mouth 2 (two) times daily.  180 tablet  0  . Multiple Vitamins-Minerals (MULTIVITAL) tablet Take 1 tablet by mouth daily.        . Omega-3 Fatty Acids (FISH OIL) 1200 MG CAPS Take 2 capsules by mouth 2 (two) times daily.      . pravastatin (PRAVACHOL) 40 MG tablet TAKE  ONE TABLET BY MOUTH DAILY  90 tablet  0  . ibuprofen (ADVIL,MOTRIN) 200 MG tablet Take 200 mg by mouth every 6 (six) hours as needed.         No current facility-administered medications on file prior to visit.   Allergies  Allergen Reactions  . Diovan (Valsartan) Cough  . Tagamet (Cimetidine) Other (See Comments)    irrittability   ROS:  Denies fevers, URI symptoms, headaches, dizziness, chest pain, shortness of breath, cough, GI complaints. +urinary complaints as per HPI. "rash" at elbow. Leg cramps. Like sticking needles in the back when he goes to bed at night.  Lasts just a few minutes, like sheets are irritating his skin.    PHYSICAL EXAM: BP 104/60  Pulse 80  Ht 5\' 10"  (1.778 m)  Wt 249 lb (112.946 kg)  BMI 35.73 kg/m2 120/66 Pleasant older gentleman, in no distress HEENT:  PERRL, conjunctiva clear.  OP clear Neck: no lymphadenopathy, thyromegaly  or carotid bruit Heart: regular rate and rhythm without murmur Lungs: clear bilaterally Back: no CVA or spinal tenderness Abdomen: soft, nontender, no mass Extremities: no edema.  Dry skin at elbows Psych: normal mood, affect, hygiene and grooming Neuro: alert and oriented. Cranial nerves grossly intact, normal gait  Lab Results  Component Value Date   HGBA1C 5.6 07/22/2012   ASSESSMENT/PLAN: IFG (impaired fasting glucose) - Plan: HgB A1c  Impaired fasting glucose  Mixed hyperlipidemia  Essential hypertension, benign  Urinary frequency - Plan: Urinalysis Dipstick, oxybutynin (DITROPAN-XL) 5 MG 24 hr tablet  Urinary frequency--suspect overactive bladder.  Prostate at CPE was only mildly enlarged, no improvement with saw palmetto, and symptoms are daytime and nighttime. Check urine dip to rule out infection Start ditropan XL 5mg  daily for one week.  If not improved, increase to 2 tablets after a week.  May increase further to 3 tablets after another week if tolerating medication (not too much dry mouth or constipation), but symptoms inadequately treated. Send rx to [facility] in [place] (if urine normal)  HTN--well controlled  Pt states he doesn't need any refills at this point.  Will let us know when needed (epic very confusing--crossed off as d/c'd, but filled 06/2012, active rx's look like last filled 90 days ago and refill needed now...).  Pt states he has a full bottle of at least 2 of his meds at home (90 day supply).  Impaired fasting glucose--continue exercise, diet, weight loss  Hyperlipidemia--well controlled per last labs 6 months ago.  Return in 6 months for CPE/med check.  Fasting (moved to [place], doesn't want to come twice)

## 2012-07-22 NOTE — Patient Instructions (Addendum)
If your urine today doesn't show any infection, then will treat for overactive bladder. Start ditropan XL 5mg  daily for one week.  If not improved, increase to 2 tablets daily after a week.  May increase further to 3 tablets (at once) after another week if tolerating medication (not too much dry mouth or constipation), but symptoms inadequately treated.  Once we know which dose is effective (ie 5, 10 or 15mg ), then we change the prescription to the appropriate dose for further refills. Call after 3-4 weeks to let us know if medication is effective, and at which dose.  Moisturize your skin twice daily. Avoid prolonged hot showers

## 2012-07-24 ENCOUNTER — Encounter: Payer: Self-pay | Admitting: Family Medicine

## 2012-07-31 ENCOUNTER — Telehealth: Payer: Self-pay | Admitting: Family Medicine

## 2012-07-31 NOTE — Telephone Encounter (Signed)
Spoke to patient today concerning prior auth.. Request we have for Oxybutynin ER.  Per patient insurance was not originally going to cover Rx due to quantity but they did go ahead and pay for this Rx. Patient is currently increasing dose weekly to see what daily dose he needs to be on, so once that is established and he is given a "normal" monthly Rx, prior authorization will probably not be an issue, so I advised patient to contact us back if he discovers he needs prior [place]

## 2012-08-04 ENCOUNTER — Telehealth: Payer: Self-pay | Admitting: Family Medicine

## 2012-08-04 NOTE — Telephone Encounter (Signed)
We will not need to reorder.  This quantity was only for the first month, to establish the dose he needs for refills.  Pt is aware

## 2012-08-04 NOTE — Telephone Encounter (Signed)
Gary Carroll with [facility] called and states she will allow the over ride of 90 tablets per month  Quantity Limit is ok.  [name]# [phone].  Dr. Lynelle Doctor needs to re order rx for 90 tabs

## 2012-08-17 ENCOUNTER — Telehealth: Payer: Self-pay | Admitting: Internal Medicine

## 2012-08-17 DIAGNOSIS — R350 Frequency of micturition: Secondary | ICD-10-CM

## 2012-08-17 MED ORDER — OXYBUTYNIN CHLORIDE ER 15 MG PO TB24: ORAL_TABLET | ORAL | Status: DC

## 2012-08-17 NOTE — Telephone Encounter (Signed)
Since he seems to be having some effect, will send rx to his pharmacy for the 15mg  tablet, to take just 1 pill once daily.  If side effects become intolerable, or if he no longer feels that it is working (in the future), then we can possibly try other meds (ie Detrol) but they make be more expensive

## 2012-08-17 NOTE — Telephone Encounter (Signed)
Pt called stating that the oxybutynin helps sometimes and does not help the other times. His mouth is dry and has a weird taste in his mouth. He has 2 days left of this pill and he is at 3 pills a day with this med. Send to [facility] and pt would like to be called to know whats going on

## 2012-08-17 NOTE — Telephone Encounter (Signed)
Patient advised.

## 2012-08-24 ENCOUNTER — Telehealth: Payer: Self-pay | Admitting: Family Medicine

## 2012-08-24 DIAGNOSIS — R350 Frequency of micturition: Secondary | ICD-10-CM

## 2012-08-24 MED ORDER — TOLTERODINE TARTRATE ER 4 MG PO CP24: 4.0000 mg | ORAL_CAPSULE | Freq: Every day | ORAL | Status: DC

## 2012-08-24 NOTE — Telephone Encounter (Signed)
Ok to change to [facility] LA 4 mg once daily #30 with 2 refills

## 2012-08-24 NOTE — Telephone Encounter (Signed)
Called into [facility], patient notified.

## 2012-08-31 ENCOUNTER — Encounter: Payer: Self-pay | Admitting: Family Medicine

## 2012-08-31 ENCOUNTER — Ambulatory Visit (INDEPENDENT_AMBULATORY_CARE_PROVIDER_SITE_OTHER): Payer: Medicare Other | Admitting: Family Medicine

## 2012-08-31 VITALS — BP 124/78 | HR 64 | Ht 70.0 in | Wt 251.0 lb

## 2012-08-31 DIAGNOSIS — R609 Edema, unspecified: Secondary | ICD-10-CM | POA: Insufficient documentation

## 2012-08-31 DIAGNOSIS — R350 Frequency of micturition: Secondary | ICD-10-CM

## 2012-08-31 DIAGNOSIS — I10 Essential (primary) hypertension: Secondary | ICD-10-CM

## 2012-08-31 LAB — POCT URINALYSIS DIPSTICK
Bilirubin, UA: NEGATIVE
Ketones, UA: NEGATIVE
pH, UA: 7

## 2012-08-31 NOTE — Progress Notes (Signed)
Chief Complaint  Patient presents with  . Leg Swelling    and feet swelling. Someone at the gym noticed that his legs and feet were swollen. He did not notice and is not having any pain from this.   Swelling was first noticed by someone at the gym 3 days ago.  Patient hadn't been aware of the swelling. When he got home, his girlfriend also confirmed that his legs were swollen.  He just started wearing shorts due to the warmer weather, possibly could have had swelling for much longer.  He does note that sometimes his shoes feel tight at the end of the day, but otherwise hadn't noticed swelling.   Denies any change in salt intake--he salts all his food, including his salads.  +canned foods, some frozen foods, some fast food (but has cut back some).    Overactive bladder--recently switched to Detrol.  Mouth isn't as dry as with the Ditropan, hasn't noticed much improvement yet.  Up 3-4 times at night, and frequency also during the day. Some urgency and frequency during the day, sometimes with only small volumes recently.  Past Medical History  Diagnosis Date  . Bilateral knee pain     OA  . Prediabetes   . Allergic rhinitis   . Hiatal hernia   . Heart murmur   . HLD (hyperlipidemia)   . HTN (hypertension)   . Erectile dysfunction   . Arthritis   . Asthma     seasonal per pt  . GERD (gastroesophageal reflux disease)   . Diverticulosis 02/2012    seen on colonoscopy   Past Surgical History  Procedure Laterality Date  . Tonsillectomy    . Finger surgery    . Total knee arthroplasty  2012    left (Dr. Valentina Gu)  . Cholecystectomy    . Colonoscopy  02/2012    diverticulosis; Dr. Arlyce Dice   History   Social History  . Marital Status: Married    Spouse Name: N/A    Number of Children: 0  . Years of Education: N/A   Occupational History  . retired Lobbyist)    Social History Main Topics  . Smoking status: Former Smoker    Types: Cigarettes    Quit date: 01/18/2010  . Smokeless  tobacco: Never Used     Comment: Smoked for 25 years- quit September 2011.  Passive exposure from wife  . Alcohol Use: 0.0 oz/week    7-14 Cans of beer per week     Comment: drinks a few beers daily  . Drug Use: No  . Sexually Active: Not Currently   Other Topics Concern  . Not on file   Social History Narrative   Separated from wife (2014) and moved to [place]. Retired- worked in Production designer, theatre/television/film.    Current Outpatient Prescriptions on File Prior to Visit  Medication Sig Dispense Refill  . aspirin 81 MG tablet Take 81 mg by mouth daily.        Marland Kitchen losartan (COZAAR) 100 MG tablet TAKE ONE TABLET BY MOUTH EVERY DAY  90 tablet  0  . metoprolol tartrate (LOPRESSOR) 25 MG tablet Take 1 tablet (25 mg total) by mouth 2 (two) times daily.  180 tablet  0  . Multiple Vitamins-Minerals (MULTIVITAL) tablet Take 1 tablet by mouth daily.        . Omega-3 Fatty Acids (FISH OIL) 1200 MG CAPS Take 2 capsules by mouth 2 (two) times daily.      . pravastatin (PRAVACHOL) 40 MG tablet  TAKE ONE TABLET BY MOUTH DAILY  90 tablet  0  . saw palmetto 160 MG capsule Take 160 mg by mouth 2 (two) times daily.      Marland Kitchen tolterodine (DETROL LA) 4 MG 24 hr capsule Take 1 capsule (4 mg total) by mouth daily.  30 capsule  2  . glucosamine-chondroitin 500-400 MG tablet Take 2 tablets by mouth daily.      Marland Kitchen ibuprofen (ADVIL,MOTRIN) 200 MG tablet Take 200 mg by mouth every 6 (six) hours as needed.         No current facility-administered medications on file prior to visit.   Allergies  Allergen Reactions  . Diovan (Valsartan) Cough  . Tagamet (Cimetidine) Other (See Comments)    irrittability   ROS:  Denies fevers, URI symptoms.  Slight dizziness/light headedness, along with some seasonal allergies.  Denies fevers, chest, pain, shortness of breath. Denies bleeding/bruising, calf pain. mark on his shins has been present "for as long as I can remember", no new lesions are rashes.  PHYSICAL EXAM: BP 124/78  Pulse 64  Ht  5\' 10"  (1.778 m)  Wt 251 lb (113.853 kg)  BMI 36.01 kg/m2 Pleasant male in no distress Neck: no lymphadenopathy, thyromegaly or mass Heart: regular rate and rhythm; Murmur at RUSB Lungs: clear bilaterally Back: no CVA tenderness Abdomen: soft, nontender, no mass Skin: Dermatofibroma L shin, nontender.  Some hyperpigmentation noted bilaterally, R>L (h/o trauma to R) Extremities: Trace edema symmetrically, to mid-shin Psych: normal mood, affect, hygiene and grooming Neuro: alert and oriented.  Cranial nerves grossly intact.  Normal gait  Urine dip: trace leuks, otherwise negative.  ASSESSMENT/PLAN:  Peripheral edema  Urinary frequency - Plan: Urinalysis Dipstick  Essential hypertension, benign  Edema- likely due to high salt in diet as well as some venous stasis.  Reviewed low sodium diet.  He has compression stockings and can consider wearing, vs keeping legs elevated with prolonged sitting.  No evidence of CHF.  Would avoid adding diuretic given his urinary complaints.  Urinary frequency--just recently started Detrol, will give it some time.  No evidence of infection.  Consider BPH treatments, vs referral to urology if not improving.  HTN--controlled on current regimen.

## 2012-08-31 NOTE — Patient Instructions (Addendum)
Sodium-Controlled Diet Sodium is a mineral. It is found in many foods. Sodium may be found naturally or added during the making of a food. The most common form of sodium is salt, which is made up of sodium and chloride. Reducing your sodium intake involves changing your eating habits. The following guidelines will help you reduce the sodium in your diet:  Stop using the salt shaker.  Use salt sparingly in cooking and baking.  Substitute with sodium-free seasonings and spices.  Do not use a salt substitute (potassium chloride) without your caregiver's permission.  Include a variety of fresh, unprocessed foods in your diet.  Limit the use of processed and convenience foods that are high in sodium. USE THE FOLLOWING FOODS SPARINGLY: Breads/Starches  Commercial bread stuffing, commercial pancake or waffle mixes, coating mixes. Waffles. Croutons. Prepared (boxed or frozen) potato, rice, or noodle mixes that contain salt or sodium. Salted [place] fries or hash browns. Salted popcorn, breads, crackers, chips, or snack foods. Vegetables  Vegetables canned with salt or prepared in cream, butter, or cheese sauces. Sauerkraut. Tomato or vegetable juices canned with salt.  Fresh vegetables are allowed if rinsed thoroughly. Fruit  Fruit is okay to eat. Meat and Meat Substitutes  Salted or smoked meats, such as bacon or Canadian bacon, chipped or corned beef, hot dogs, salt pork, luncheon meats, pastrami, ham, or sausage. Canned or smoked fish, poultry, or meat. Processed cheese or cheese spreads, blue or Roquefort cheese. Battered or frozen fish products. Prepared spaghetti sauce. Baked beans. Reuben sandwiches. Salted nuts. Caviar. Milk  Limit buttermilk to 1 cup per week. Soups and Combination Foods  Bouillon cubes, canned or dried soups, broth, consomm. Convenience (frozen or packaged) dinners with more than 600 mg sodium. Pot pies, pizza, Asian food, fast food cheeseburgers, and specialty  sandwiches. Desserts and Sweets  Regular (salted) desserts, pie, commercial fruit snack pies, commercial snack cakes, canned puddings.  Eat desserts and sweets in moderation. Fats and Oils  Gravy mixes or canned gravy. No more than 1 to 2 tbs of salad dressing. Chip dips.  Eat fats and oils in moderation. Beverages  See those listed under the vegetables and milk groups. Condiments  Ketchup, mustard, meat sauces, salsa, regular (salted) and lite soy sauce or mustard. Dill pickles, olives, meat tenderizer. Prepared horseradish or pickle relish. Dutch-processed cocoa. Baking powder or baking soda used medicinally. Worcestershire sauce. "Light" salt. Salt substitute, unless approved by your caregiver. Document Released: 10/26/2001 Document Revised: 07/29/2011 Document Reviewed: 05/29/2009 [facility] Patient Information 2013 [place], [place].  Venous Stasis and Chronic Venous Insufficiency As people age, the veins located in their legs may weaken and stretch. When veins weaken and lose the ability to pump blood effectively, the condition is called chronic venous insufficiency (CVI) or venous stasis. Almost all veins return blood back to the heart. This happens by:  The force of the heart pumping fresh blood pushes blood back to the heart.  Blood flowing to the heart from the force of gravity. In the deep veins of the legs, blood has to fight gravity and flow upstream back to the heart. Here, the leg muscles contract to pump blood back toward the heart. Vein walls are elastic, and many veins have small valves that only allow blood to flow in one direction. When leg muscles contract, they push inward against the elastic vein walls. This squeezes blood upward, opens the valves, and moves blood toward the heart. When leg muscles relax, the vein wall also relaxes and the valves  inside the vein close to prevent blood from flowing backward. This method of pumping blood out of the legs is called the venous  pump. CAUSES  The venous pump works best while walking and leg muscles are contracting. But when a person sits or stands, blood pressure in leg veins can build. Deep veins are usually able to withstand short periods of inactivity, but long periods of inactivity (and increased pressure) can stretch, weaken, and damage vein walls. High blood pressure can also stretch and damage vein walls. The veins may no longer be able to pump blood back to the heart. Venous hypertension (high blood pressure inside veins) that lasts over time is a primary cause of CVI. CVI can also be caused by:   Deep vein thrombosis, a condition where a thrombus (blood clot) blocks blood flow in a vein.  Phlebitis, an inflammation of a superficial vein that causes a blood clot to form. Other risk factors for CVI may include:   Heredity.  Obesity.  Pregnancy.  Sedentary lifestyle.  Smoking.  Jobs requiring long periods of standing or sitting in one place.  Age and gender:  Women in their 40's and 35's and men in their 31's are more prone to developing CVI. SYMPTOMS  Symptoms of CVI may include:   Varicose veins.  Ulceration or skin breakdown.  Lipodermatosclerosis, a condition that affects the skin just above the ankle, usually on the inside surface. Over time the skin becomes brown, smooth, tight and often painful. Those with this condition have a high risk of developing skin ulcers.  Reddened or discolored skin on the leg.  Swelling. DIAGNOSIS  Your caregiver can diagnose CVI after performing a careful medical history and physical examination. To confirm the diagnosis, the following tests may also be ordered:   Duplex ultrasound.  Plethysmography (tests blood flow).  Venograms (x-ray using a special dye). TREATMENT The goals of treatment for CVI are to restore a person to an active life and to minimize pain or disability. Typically, CVI does not pose a serious threat to life or limb, and with proper  treatment most people with this condition can continue to lead active lives. In most cases, mild CVI can be treated on an outpatient basis with simple procedures. Treatment methods include:   Elastic compression socks.  Sclerotherapy, a procedure involving an injection of a material that "dissolves" the damaged veins. Other veins in the network of blood vessels take over the function of the damaged veins.  Vein stripping (an older procedure less commonly used).  Laser Ablation surgery.  Valve repair. HOME CARE INSTRUCTIONS   Elastic compression socks must be worn every day. They can help with symptoms and lower the chances of the problem getting worse, but they do not cure the problem.  Only take over-the-counter or prescription medicines for pain, discomfort, or fever as directed by your caregiver.  Your caregiver will review your other medications with you. SEEK MEDICAL CARE IF:   You are confused about how to take your medications.  There is redness, swelling, or increasing pain in the affected area.  There is a red streak or line that extends up or down from the affected area.  There is a breakdown or loss of skin in the affected area, even if the breakdown is small.  You develop an unexplained oral temperature above 102 F (38.9 C).  There is an injury to the affected area. SEEK IMMEDIATE MEDICAL CARE IF:   There is an injury and open wound  to the affected area.  Pain is not adequately relieved with pain medication prescribed or becomes severe.  An oral temperature above 102 F (38.9 C) develops.  The foot/ankle below the affected area becomes suddenly numb or the area feels weak and hard to move. MAKE SURE YOU:   Understand these instructions.  Will watch your condition.  Will get help right away if you are not doing well or get worse. Document Released: 09/09/2006 Document Revised: 07/29/2011 Document Reviewed: 11/17/2006 [facility] Patient Information 2013  [place], [place].

## 2012-09-16 ENCOUNTER — Telehealth: Payer: Self-pay | Admitting: Family Medicine

## 2012-09-16 DIAGNOSIS — E785 Hyperlipidemia, unspecified: Secondary | ICD-10-CM

## 2012-09-16 DIAGNOSIS — I10 Essential (primary) hypertension: Secondary | ICD-10-CM

## 2012-09-16 MED ORDER — PRAVASTATIN SODIUM 40 MG PO TABS: 40.0000 mg | ORAL_TABLET | Freq: Every day | ORAL | Status: DC

## 2012-09-16 MED ORDER — LOSARTAN POTASSIUM 100 MG PO TABS: 100.0000 mg | ORAL_TABLET | Freq: Every day | ORAL | Status: DC

## 2012-09-16 NOTE — Telephone Encounter (Signed)
Done

## 2012-10-06 ENCOUNTER — Telehealth: Payer: Self-pay | Admitting: Internal Medicine

## 2012-10-06 DIAGNOSIS — Z0289 Encounter for other administrative examinations: Secondary | ICD-10-CM

## 2012-10-06 NOTE — Telephone Encounter (Signed)
Pt is transferring out to Dr. Dwana Melena in [place] and will need to pay $10.00 charge fee to transfer out. Once pt pays, i will fax records to new Dr. Marland Kitchen [phone]

## 2013-01-28 ENCOUNTER — Encounter: Payer: Medicare Other | Admitting: Family Medicine

## 2013-03-25 ENCOUNTER — Other Ambulatory Visit: Payer: Self-pay

## 2014-03-04 ENCOUNTER — Other Ambulatory Visit: Payer: Self-pay

## 2014-11-14 ENCOUNTER — Other Ambulatory Visit: Payer: Self-pay

## 2015-05-21 HISTORY — PX: OTHER SURGICAL HISTORY: SHX169

## 2015-05-23 DIAGNOSIS — G8929 Other chronic pain: Secondary | ICD-10-CM | POA: Insufficient documentation

## 2015-05-29 ENCOUNTER — Other Ambulatory Visit: Payer: Self-pay | Admitting: Orthopedic Surgery

## 2015-06-08 ENCOUNTER — Encounter (HOSPITAL_COMMUNITY): Payer: Self-pay

## 2015-06-08 ENCOUNTER — Ambulatory Visit (HOSPITAL_COMMUNITY)
Admission: RE | Admit: 2015-06-08 | Discharge: 2015-06-08 | Disposition: A | Discharge status: Home / Self Care | Payer: No Typology Code available for payment source | Source: Ambulatory Visit | Attending: Orthopedic Surgery | Admitting: Orthopedic Surgery

## 2015-06-08 ENCOUNTER — Encounter (HOSPITAL_COMMUNITY)
Admission: RE | Admit: 2015-06-08 | Discharge: 2015-06-08 | Disposition: A | Discharge status: Home / Self Care | Payer: No Typology Code available for payment source | Source: Ambulatory Visit | Attending: Orthopedic Surgery | Admitting: Orthopedic Surgery

## 2015-06-08 DIAGNOSIS — Z01818 Encounter for other preprocedural examination: Secondary | ICD-10-CM | POA: Diagnosis not present

## 2015-06-08 DIAGNOSIS — I10 Essential (primary) hypertension: Secondary | ICD-10-CM | POA: Insufficient documentation

## 2015-06-08 DIAGNOSIS — R918 Other nonspecific abnormal finding of lung field: Secondary | ICD-10-CM | POA: Diagnosis not present

## 2015-06-08 DIAGNOSIS — R0989 Other specified symptoms and signs involving the circulatory and respiratory systems: Secondary | ICD-10-CM | POA: Diagnosis not present

## 2015-06-08 DIAGNOSIS — Z01810 Encounter for preprocedural cardiovascular examination: Secondary | ICD-10-CM | POA: Diagnosis not present

## 2015-06-08 DIAGNOSIS — Z01812 Encounter for preprocedural laboratory examination: Secondary | ICD-10-CM | POA: Insufficient documentation

## 2015-06-08 HISTORY — DX: Tinnitus, unspecified ear: H93.19

## 2015-06-08 HISTORY — DX: Presence of spectacles and contact lenses: Z97.3

## 2015-06-08 HISTORY — DX: Urgency of urination: R39.15

## 2015-06-08 HISTORY — DX: Frequency of micturition: R35.0

## 2015-06-08 HISTORY — DX: Overactive bladder: N32.81

## 2015-06-08 LAB — CBC WITH DIFFERENTIAL/PLATELET
Basophils Absolute: 0.1 10*3/uL (ref 0.0–0.1)
Basophils Relative: 1 %
Eosinophils Absolute: 0.2 10*3/uL (ref 0.0–0.7)
Eosinophils Relative: 3 %
HCT: 44.5 % (ref 39.0–52.0)
HEMOGLOBIN: 15 g/dL (ref 13.0–17.0)
LYMPHS ABS: 1.5 10*3/uL (ref 0.7–4.0)
LYMPHS PCT: 25 %
MCH: 30.2 pg (ref 26.0–34.0)
MCHC: 33.7 g/dL (ref 30.0–36.0)
MCV: 89.7 fL (ref 78.0–100.0)
Monocytes Absolute: 0.7 10*3/uL (ref 0.1–1.0)
Monocytes Relative: 12 %
NEUTROS ABS: 3.6 10*3/uL (ref 1.7–7.7)
NEUTROS PCT: 59 %
Platelets: 200 10*3/uL (ref 150–400)
RBC: 4.96 MIL/uL (ref 4.22–5.81)
RDW: 13.5 % (ref 11.5–15.5)
WBC: 6 10*3/uL (ref 4.0–10.5)

## 2015-06-08 LAB — COMPREHENSIVE METABOLIC PANEL
ALK PHOS: 57 U/L (ref 38–126)
ALT: 37 U/L (ref 17–63)
AST: 29 U/L (ref 15–41)
Albumin: 3.8 g/dL (ref 3.5–5.0)
Anion gap: 11 (ref 5–15)
BUN: 15 mg/dL (ref 6–20)
CALCIUM: 9.4 mg/dL (ref 8.9–10.3)
CO2: 22 mmol/L (ref 22–32)
CREATININE: 0.86 mg/dL (ref 0.61–1.24)
Chloride: 107 mmol/L (ref 101–111)
Glucose, Bld: 101 mg/dL — ABNORMAL HIGH (ref 65–99)
Potassium: 4.3 mmol/L (ref 3.5–5.1)
Sodium: 140 mmol/L (ref 135–145)
Total Bilirubin: 0.3 mg/dL (ref 0.3–1.2)
Total Protein: 6.3 g/dL — ABNORMAL LOW (ref 6.5–8.1)

## 2015-06-08 LAB — SURGICAL PCR SCREEN
MRSA, PCR: NEGATIVE
Staphylococcus aureus: NEGATIVE

## 2015-06-08 LAB — URINALYSIS, ROUTINE W REFLEX MICROSCOPIC
BILIRUBIN URINE: NEGATIVE
GLUCOSE, UA: NEGATIVE mg/dL
Hgb urine dipstick: NEGATIVE
KETONES UR: NEGATIVE mg/dL
Leukocytes, UA: NEGATIVE
Nitrite: NEGATIVE
PH: 6 (ref 5.0–8.0)
Protein, ur: NEGATIVE mg/dL
Specific Gravity, Urine: 1.025 (ref 1.005–1.030)

## 2015-06-08 LAB — PROTIME-INR
INR: 1.15 (ref 0.00–1.49)
PROTHROMBIN TIME: 14.9 s (ref 11.6–15.2)

## 2015-06-08 LAB — APTT: aPTT: 30 seconds (ref 24–37)

## 2015-06-08 NOTE — Progress Notes (Signed)
   06/08/15 1107  OBSTRUCTIVE SLEEP APNEA  Have you ever been diagnosed with sleep apnea through a sleep study? No  Do you snore loudly (loud enough to be heard through closed doors)?  0  Do you often feel tired, fatigued, or sleepy during the daytime (such as falling asleep during driving or talking to someone)? 1  Has anyone observed you stop breathing during your sleep? 0  Do you have, or are you being treated for high blood pressure? 1  BMI more than 35 kg/m2? 0  Age > 50 (1-yes) 1  Neck circumference greater than:Male 16 inches or larger, Male 17inches or larger? 1  Male Gender (Yes=1) 1  Obstructive Sleep Apnea Score 5  Score 5 or greater  Results sent to PCP

## 2015-06-08 NOTE — Pre-Procedure Instructions (Addendum)
    Gary Carroll  06/08/2015        Your procedure is scheduled on Monday, January 30th, 2017  Report to [facility] Admitting at 6:30 A.M.  Call this number if you have problems the morning of surgery:  [phone]   Remember:  Do not eat food or drink liquids after midnight.   Take these medicines the morning of surgery with A SIP OF WATER: Metoprolol Tartrate (Lopressor), Sodium Chloride ([place]) nasal spray if needed.  7 days prior to surgery, stop taking: Aspirin, NSAIDS, Aleve, Naproxen, Ibuprofen, Advil, Motrin, BC's, Goody's, Fish oil, all herbal medications, and all vitamins.    Do not wear jewelry.  Do not wear lotions, powders, or colognes.  You may wear deodorant.  Men may shave face and neck.  Do not bring valuables to the hospital.  [facility] is not responsible for any belongings or valuables.  Contacts, dentures or bridgework may not be worn into surgery.  Leave your suitcase in the car.  After surgery it may be brought to your room.  For patients admitted to the hospital, discharge time will be determined by your treatment team.  Patients discharged the day of surgery will not be allowed to drive home.   Special instructions:  See attached.   Please read over the following fact sheets that you were given. Pain Booklet, Coughing and Deep Breathing, Total Joint Packet, MRSA Information and Surgical Site Infection Prevention

## 2015-06-08 NOTE — Progress Notes (Signed)
PCP - Dr. Allyn Kenner  [facility] - [facility], [place], [place]  EKG - 06/08/15 CXR - 06/08/15  Echo/Cardiac Cath - denies Stress test - 2011 - Epic  Patient denies chest pain and shortness of breath at PAT appointment.

## 2015-06-09 ENCOUNTER — Encounter (HOSPITAL_COMMUNITY): Payer: Self-pay

## 2015-06-09 DIAGNOSIS — M1711 Unilateral primary osteoarthritis, right knee: Secondary | ICD-10-CM | POA: Insufficient documentation

## 2015-06-09 LAB — URINE CULTURE

## 2015-06-09 LAB — HEMOGLOBIN A1C
HEMOGLOBIN A1C: 6.1 % — AB (ref 4.8–5.6)
MEAN PLASMA GLUCOSE: 128 mg/dL

## 2015-06-09 NOTE — Progress Notes (Signed)
Anesthesia Chart Review: Patient is a 70 year old male scheduled for right TKA on 06/19/15 by Dr. Ronnie Derby.  History includes former smoker, pre-diabetes, HTN, GERD, hiatal hernia, murmur (not specified), HLD, seasonal asthma, overactive bladder, tinnitus, glasses, left TKA '12, tonsillectomy, cholecystectomy.  PCP - Dr. Allyn Kenner  [facility] - [facility] Rod[facility] Langton, [place], [place] He is not routinely followed by cardiology, but saw Dr. Angelena Form in 2011 prior to left TKA and had a non-ischemic stress test.  Meds include ASA 81mg , losartan, Lopressor, fish oil, pravastatin.  06/08/15 EKG: NSR.  05/16/10 Nuclear stress test: Overall Impression: Normal lexiscan nuclear study with inferior thinning but no ischemia. LVEF 67%.  06/08/15 CXR: IMPRESSION: No evidence of acute cardiopulmonary disease. Chronic peribronchial thickening.  Preoperative labs noted. A1c 6.1. Urine culture showed MULTIPLE SPECIES PRESENT, SUGGEST RECOLLECTION.  If no acute changes then I anticipate that he can proceed as planned.  George Hugh [facility] Short Stay Center/Anesthesiology Phone [phone] 06/09/2015 1:33 PM

## 2015-06-16 MED ORDER — TRANEXAMIC ACID 1000 MG/10ML IV SOLN
2000.0000 mg | INTRAVENOUS | Status: DC
  Filled 2015-06-16: qty 20

## 2015-06-16 MED ORDER — CHLORHEXIDINE GLUCONATE 4 % EX LIQD: 60.0000 mL | Freq: Once | CUTANEOUS | Status: DC

## 2015-06-16 MED ORDER — SODIUM CHLORIDE 0.9 % IV SOLN: INTRAVENOUS | Status: DC

## 2015-06-16 MED ORDER — BUPIVACAINE LIPOSOME 1.3 % IJ SUSP
20.0000 mL | Freq: Once | INTRAMUSCULAR | Status: AC
  Administered 2015-06-19: 20 mL
  Filled 2015-06-16: qty 20

## 2015-06-16 MED ORDER — CEFAZOLIN SODIUM-DEXTROSE 2-3 GM-% IV SOLR
2.0000 g | INTRAVENOUS | Status: AC
  Administered 2015-06-19: 2 g via INTRAVENOUS
  Filled 2015-06-16: qty 50

## 2015-06-19 ENCOUNTER — Inpatient Hospital Stay (HOSPITAL_COMMUNITY): Payer: No Typology Code available for payment source | Admitting: Anesthesiology

## 2015-06-19 ENCOUNTER — Inpatient Hospital Stay (HOSPITAL_COMMUNITY): Payer: No Typology Code available for payment source | Admitting: Vascular Surgery

## 2015-06-19 ENCOUNTER — Encounter (HOSPITAL_COMMUNITY)
Admission: RE | Disposition: A | Discharge status: Home Health | Payer: Self-pay | Source: Ambulatory Visit | Attending: Orthopedic Surgery

## 2015-06-19 ENCOUNTER — Encounter (HOSPITAL_COMMUNITY): Payer: Self-pay | Admitting: Surgery

## 2015-06-19 ENCOUNTER — Inpatient Hospital Stay (HOSPITAL_COMMUNITY)
Admission: RE | Admit: 2015-06-19 | Discharge: 2015-06-23 | DRG: 470 | Disposition: A | Discharge status: Home Health | Payer: No Typology Code available for payment source | Source: Ambulatory Visit | Attending: Orthopedic Surgery | Admitting: Orthopedic Surgery

## 2015-06-19 DIAGNOSIS — I10 Essential (primary) hypertension: Secondary | ICD-10-CM | POA: Diagnosis present

## 2015-06-19 DIAGNOSIS — J45909 Unspecified asthma, uncomplicated: Secondary | ICD-10-CM | POA: Diagnosis present

## 2015-06-19 DIAGNOSIS — M25561 Pain in right knee: Secondary | ICD-10-CM | POA: Diagnosis present

## 2015-06-19 DIAGNOSIS — Z96652 Presence of left artificial knee joint: Secondary | ICD-10-CM | POA: Diagnosis present

## 2015-06-19 DIAGNOSIS — M1711 Unilateral primary osteoarthritis, right knee: Secondary | ICD-10-CM | POA: Diagnosis not present

## 2015-06-19 DIAGNOSIS — E782 Mixed hyperlipidemia: Secondary | ICD-10-CM | POA: Diagnosis present

## 2015-06-19 DIAGNOSIS — K219 Gastro-esophageal reflux disease without esophagitis: Secondary | ICD-10-CM | POA: Diagnosis present

## 2015-06-19 DIAGNOSIS — Z96651 Presence of right artificial knee joint: Secondary | ICD-10-CM

## 2015-06-19 DIAGNOSIS — Z7982 Long term (current) use of aspirin: Secondary | ICD-10-CM | POA: Diagnosis not present

## 2015-06-19 DIAGNOSIS — Z87891 Personal history of nicotine dependence: Secondary | ICD-10-CM

## 2015-06-19 DIAGNOSIS — Z96659 Presence of unspecified artificial knee joint: Secondary | ICD-10-CM

## 2015-06-19 DIAGNOSIS — D62 Acute posthemorrhagic anemia: Secondary | ICD-10-CM | POA: Diagnosis not present

## 2015-06-19 DIAGNOSIS — Z79899 Other long term (current) drug therapy: Secondary | ICD-10-CM

## 2015-06-19 HISTORY — PX: TOTAL KNEE ARTHROPLASTY: SHX125

## 2015-06-19 LAB — CBC
HEMATOCRIT: 41.3 % (ref 39.0–52.0)
HEMOGLOBIN: 13.9 g/dL (ref 13.0–17.0)
MCH: 30 pg (ref 26.0–34.0)
MCHC: 33.7 g/dL (ref 30.0–36.0)
MCV: 89 fL (ref 78.0–100.0)
Platelets: 189 10*3/uL (ref 150–400)
RBC: 4.64 MIL/uL (ref 4.22–5.81)
RDW: 13.5 % (ref 11.5–15.5)
WBC: 4.8 10*3/uL (ref 4.0–10.5)

## 2015-06-19 LAB — CREATININE, SERUM: Creatinine, Ser: 0.81 mg/dL (ref 0.61–1.24)

## 2015-06-19 SURGERY — ARTHROPLASTY, KNEE, TOTAL
Anesthesia: Regional | Site: Knee | Laterality: Right

## 2015-06-19 MED ORDER — DIPHENHYDRAMINE HCL 12.5 MG/5ML PO ELIX: 12.5000 mg | ORAL_SOLUTION | ORAL | Status: DC | PRN

## 2015-06-19 MED ORDER — FLEET ENEMA 7-19 GM/118ML RE ENEM
1.0000 | ENEMA | Freq: Once | RECTAL | Status: AC | PRN
  Administered 2015-06-21: 1 via RECTAL
  Filled 2015-06-19: qty 1

## 2015-06-19 MED ORDER — LIDOCAINE HCL (CARDIAC) 20 MG/ML IV SOLN
INTRAVENOUS | Status: DC | PRN
  Administered 2015-06-19: 20 mg via INTRAVENOUS

## 2015-06-19 MED ORDER — SODIUM CHLORIDE 0.9 % IV SOLN
INTRAVENOUS | Status: DC
  Administered 2015-06-19: 17:00:00 via INTRAVENOUS

## 2015-06-19 MED ORDER — MIDAZOLAM HCL 2 MG/2ML IJ SOLN
INTRAMUSCULAR | Status: AC
  Filled 2015-06-19: qty 2

## 2015-06-19 MED ORDER — DOCUSATE SODIUM 100 MG PO CAPS
100.0000 mg | ORAL_CAPSULE | Freq: Two times a day (BID) | ORAL | Status: DC
  Administered 2015-06-19 – 2015-06-23 (×8): 100 mg via ORAL
  Filled 2015-06-19 (×8): qty 1

## 2015-06-19 MED ORDER — ENOXAPARIN SODIUM 30 MG/0.3ML ~~LOC~~ SOLN
30.0000 mg | Freq: Two times a day (BID) | SUBCUTANEOUS | Status: DC
  Administered 2015-06-20 – 2015-06-23 (×7): 30 mg via SUBCUTANEOUS
  Filled 2015-06-19 (×7): qty 0.3

## 2015-06-19 MED ORDER — LIDOCAINE HCL (CARDIAC) 20 MG/ML IV SOLN
INTRAVENOUS | Status: AC
  Filled 2015-06-19: qty 5

## 2015-06-19 MED ORDER — ACETAMINOPHEN 325 MG PO TABS
650.0000 mg | ORAL_TABLET | Freq: Four times a day (QID) | ORAL | Status: DC | PRN
  Administered 2015-06-19 – 2015-06-23 (×7): 650 mg via ORAL
  Filled 2015-06-19 (×6): qty 2

## 2015-06-19 MED ORDER — OXYCODONE HCL ER 10 MG PO T12A
10.0000 mg | EXTENDED_RELEASE_TABLET | Freq: Two times a day (BID) | ORAL | Status: DC
Start: 2015-06-19 — End: 2015-06-21
  Administered 2015-06-19 – 2015-06-21 (×4): 10 mg via ORAL
  Filled 2015-06-19 (×4): qty 1

## 2015-06-19 MED ORDER — BUPIVACAINE IN DEXTROSE 0.75-8.25 % IT SOLN
INTRATHECAL | Status: DC | PRN
  Administered 2015-06-19: 2 mL via INTRATHECAL

## 2015-06-19 MED ORDER — PHENOL 1.4 % MT LIQD: 1.0000 | OROMUCOSAL | Status: DC | PRN

## 2015-06-19 MED ORDER — ACETAMINOPHEN 650 MG RE SUPP: 650.0000 mg | Freq: Four times a day (QID) | RECTAL | Status: DC | PRN

## 2015-06-19 MED ORDER — CEFAZOLIN SODIUM-DEXTROSE 2-3 GM-% IV SOLR
2.0000 g | Freq: Four times a day (QID) | INTRAVENOUS | Status: AC
  Administered 2015-06-19: 2 g via INTRAVENOUS
  Filled 2015-06-19 (×3): qty 50

## 2015-06-19 MED ORDER — OXYCODONE HCL 5 MG PO TABS
5.0000 mg | ORAL_TABLET | ORAL | Status: DC | PRN
  Administered 2015-06-19 – 2015-06-20 (×6): 10 mg via ORAL
  Filled 2015-06-19 (×6): qty 2

## 2015-06-19 MED ORDER — FENTANYL CITRATE (PF) 100 MCG/2ML IJ SOLN
INTRAMUSCULAR | Status: AC
  Filled 2015-06-19: qty 2

## 2015-06-19 MED ORDER — BUPIVACAINE-EPINEPHRINE (PF) 0.5% -1:200000 IJ SOLN
INTRAMUSCULAR | Status: AC
  Filled 2015-06-19: qty 30

## 2015-06-19 MED ORDER — PROPOFOL 10 MG/ML IV BOLUS
INTRAVENOUS | Status: AC
  Filled 2015-06-19: qty 20

## 2015-06-19 MED ORDER — ONDANSETRON HCL 4 MG PO TABS
4.0000 mg | ORAL_TABLET | Freq: Four times a day (QID) | ORAL | Status: DC | PRN
  Administered 2015-06-20 – 2015-06-21 (×2): 4 mg via ORAL
  Filled 2015-06-19 (×2): qty 1

## 2015-06-19 MED ORDER — BUPIVACAINE-EPINEPHRINE (PF) 0.25% -1:200000 IJ SOLN
INTRAMUSCULAR | Status: DC | PRN
  Administered 2015-06-19: 30 mL

## 2015-06-19 MED ORDER — MIDAZOLAM HCL 5 MG/5ML IJ SOLN
INTRAMUSCULAR | Status: DC | PRN
  Administered 2015-06-19 (×2): 1 mg via INTRAVENOUS

## 2015-06-19 MED ORDER — BUPIVACAINE-EPINEPHRINE (PF) 0.25% -1:200000 IJ SOLN
INTRAMUSCULAR | Status: AC
  Filled 2015-06-19: qty 30

## 2015-06-19 MED ORDER — TRANEXAMIC ACID 1000 MG/10ML IV SOLN
1000.0000 mg | Freq: Once | INTRAVENOUS | Status: DC
  Filled 2015-06-19: qty 10

## 2015-06-19 MED ORDER — SENNOSIDES-DOCUSATE SODIUM 8.6-50 MG PO TABS: 1.0000 | ORAL_TABLET | Freq: Every evening | ORAL | Status: DC | PRN

## 2015-06-19 MED ORDER — PHENYLEPHRINE HCL 10 MG/ML IJ SOLN
INTRAMUSCULAR | Status: DC | PRN
  Administered 2015-06-19 (×9): 40 ug via INTRAVENOUS

## 2015-06-19 MED ORDER — FENTANYL CITRATE (PF) 100 MCG/2ML IJ SOLN: 25.0000 ug | INTRAMUSCULAR | Status: DC | PRN

## 2015-06-19 MED ORDER — ALUM & MAG HYDROXIDE-SIMETH 200-200-20 MG/5ML PO SUSP
30.0000 mL | ORAL | Status: DC | PRN
  Administered 2015-06-20 – 2015-06-23 (×10): 30 mL via ORAL
  Filled 2015-06-19 (×10): qty 30

## 2015-06-19 MED ORDER — METHOCARBAMOL 1000 MG/10ML IJ SOLN
500.0000 mg | Freq: Four times a day (QID) | INTRAMUSCULAR | Status: DC | PRN
  Filled 2015-06-19: qty 5

## 2015-06-19 MED ORDER — METOCLOPRAMIDE HCL 5 MG PO TABS: 5.0000 mg | ORAL_TABLET | Freq: Three times a day (TID) | ORAL | Status: DC | PRN

## 2015-06-19 MED ORDER — SODIUM CHLORIDE 0.9 % IJ SOLN
INTRAMUSCULAR | Status: AC
  Filled 2015-06-19: qty 10

## 2015-06-19 MED ORDER — HYDROMORPHONE HCL 1 MG/ML IJ SOLN
1.0000 mg | INTRAMUSCULAR | Status: DC | PRN
  Administered 2015-06-20 – 2015-06-22 (×3): 1 mg via INTRAVENOUS
  Filled 2015-06-19 (×3): qty 1

## 2015-06-19 MED ORDER — LOSARTAN POTASSIUM 50 MG PO TABS: 100.0000 mg | ORAL_TABLET | Freq: Every day | ORAL | Status: DC

## 2015-06-19 MED ORDER — TRANEXAMIC ACID 1000 MG/10ML IV SOLN
1000.0000 mg | INTRAVENOUS | Status: AC
  Administered 2015-06-19: 1000 mg via INTRAVENOUS
  Filled 2015-06-19: qty 10

## 2015-06-19 MED ORDER — METOPROLOL TARTRATE 25 MG PO TABS: 25.0000 mg | ORAL_TABLET | Freq: Two times a day (BID) | ORAL | Status: DC

## 2015-06-19 MED ORDER — OXYCODONE HCL 5 MG PO TABS
ORAL_TABLET | ORAL | Status: AC
  Administered 2015-06-19: 10 mg via ORAL
  Filled 2015-06-19: qty 2

## 2015-06-19 MED ORDER — FENTANYL CITRATE (PF) 100 MCG/2ML IJ SOLN
INTRAMUSCULAR | Status: DC | PRN
  Administered 2015-06-19: 50 ug via INTRAVENOUS

## 2015-06-19 MED ORDER — ROCURONIUM BROMIDE 50 MG/5ML IV SOLN
INTRAVENOUS | Status: AC
  Filled 2015-06-19: qty 1

## 2015-06-19 MED ORDER — BISACODYL 5 MG PO TBEC
5.0000 mg | DELAYED_RELEASE_TABLET | Freq: Every day | ORAL | Status: DC | PRN
  Administered 2015-06-21 – 2015-06-22 (×2): 5 mg via ORAL
  Filled 2015-06-19 (×2): qty 1

## 2015-06-19 MED ORDER — BUPIVACAINE-EPINEPHRINE (PF) 0.5% -1:200000 IJ SOLN
INTRAMUSCULAR | Status: DC | PRN
  Administered 2015-06-19: 30 mL via PERINEURAL

## 2015-06-19 MED ORDER — SUCCINYLCHOLINE CHLORIDE 20 MG/ML IJ SOLN
INTRAMUSCULAR | Status: AC
  Filled 2015-06-19: qty 1

## 2015-06-19 MED ORDER — METOCLOPRAMIDE HCL 5 MG/ML IJ SOLN: 5.0000 mg | Freq: Three times a day (TID) | INTRAMUSCULAR | Status: DC | PRN

## 2015-06-19 MED ORDER — LACTATED RINGERS IV SOLN
INTRAVENOUS | Status: DC
  Administered 2015-06-19 (×3): via INTRAVENOUS

## 2015-06-19 MED ORDER — METHOCARBAMOL 500 MG PO TABS
ORAL_TABLET | ORAL | Status: AC
  Administered 2015-06-19: 500 mg via ORAL
  Filled 2015-06-19: qty 1

## 2015-06-19 MED ORDER — PHENYLEPHRINE 40 MCG/ML (10ML) SYRINGE FOR IV PUSH (FOR BLOOD PRESSURE SUPPORT)
PREFILLED_SYRINGE | INTRAVENOUS | Status: AC
Start: 2015-06-19 — End: 2015-06-19
  Filled 2015-06-19: qty 20

## 2015-06-19 MED ORDER — EPHEDRINE SULFATE 50 MG/ML IJ SOLN
INTRAMUSCULAR | Status: AC
  Filled 2015-06-19: qty 1

## 2015-06-19 MED ORDER — ONDANSETRON HCL 4 MG/2ML IJ SOLN
4.0000 mg | Freq: Four times a day (QID) | INTRAMUSCULAR | Status: DC | PRN
  Administered 2015-06-21 – 2015-06-22 (×2): 4 mg via INTRAVENOUS
  Filled 2015-06-19 (×2): qty 2

## 2015-06-19 MED ORDER — METHOCARBAMOL 500 MG PO TABS
500.0000 mg | ORAL_TABLET | Freq: Four times a day (QID) | ORAL | Status: DC | PRN
  Administered 2015-06-19 – 2015-06-20 (×4): 500 mg via ORAL
  Filled 2015-06-19 (×3): qty 1

## 2015-06-19 MED ORDER — ZOLPIDEM TARTRATE 5 MG PO TABS: 5.0000 mg | ORAL_TABLET | Freq: Every evening | ORAL | Status: DC | PRN

## 2015-06-19 MED ORDER — ONDANSETRON HCL 4 MG/2ML IJ SOLN: 4.0000 mg | Freq: Once | INTRAMUSCULAR | Status: DC | PRN

## 2015-06-19 MED ORDER — MENTHOL 3 MG MT LOZG: 1.0000 | LOZENGE | OROMUCOSAL | Status: DC | PRN

## 2015-06-19 MED ORDER — PRAVASTATIN SODIUM 40 MG PO TABS: 40.0000 mg | ORAL_TABLET | Freq: Every day | ORAL | Status: DC

## 2015-06-19 MED ORDER — SODIUM CHLORIDE 0.9 % IR SOLN
Status: DC | PRN
  Administered 2015-06-19 (×2): 1000 mL

## 2015-06-19 MED ORDER — SODIUM CHLORIDE 0.9 % IJ SOLN
INTRAMUSCULAR | Status: DC | PRN
  Administered 2015-06-19: 20 mL

## 2015-06-19 MED ORDER — ACETAMINOPHEN 325 MG PO TABS
ORAL_TABLET | ORAL | Status: AC
  Administered 2015-06-19: 650 mg via ORAL
  Filled 2015-06-19: qty 2

## 2015-06-19 MED ORDER — PROPOFOL 500 MG/50ML IV EMUL
INTRAVENOUS | Status: DC | PRN
  Administered 2015-06-19: 50 ug/kg/min via INTRAVENOUS

## 2015-06-19 MED ORDER — FENTANYL CITRATE (PF) 250 MCG/5ML IJ SOLN
INTRAMUSCULAR | Status: AC
  Filled 2015-06-19: qty 5

## 2015-06-19 MED ORDER — CELECOXIB 200 MG PO CAPS
200.0000 mg | ORAL_CAPSULE | Freq: Two times a day (BID) | ORAL | Status: DC
  Administered 2015-06-19 – 2015-06-23 (×8): 200 mg via ORAL
  Filled 2015-06-19 (×8): qty 1

## 2015-06-19 SURGICAL SUPPLY — 60 items
BANDAGE ESMARK 6X9 LF (GAUZE/BANDAGES/DRESSINGS) ×1 IMPLANT
BLADE SAGITTAL 13X1.27X60 (BLADE) ×2 IMPLANT
BLADE SAGITTAL 13X1.27X60MM (BLADE) ×1
BLADE SAW SGTL 83.5X18.5 (BLADE) ×3 IMPLANT
BLADE SURG 10 STRL SS (BLADE) ×3 IMPLANT
BNDG CMPR 9X6 STRL LF SNTH (GAUZE/BANDAGES/DRESSINGS) ×1
BNDG ESMARK 6X9 LF (GAUZE/BANDAGES/DRESSINGS) ×3
BOWL SMART MIX CTS (DISPOSABLE) ×3 IMPLANT
CAPT KNEE TOTAL 3 ×3 IMPLANT
CEMENT BONE SIMPLEX SPEEDSET (Cement) ×6 IMPLANT
COVER SURGICAL LIGHT HANDLE (MISCELLANEOUS) ×3 IMPLANT
CUFF TOURNIQUET SINGLE 34IN LL (TOURNIQUET CUFF) ×3 IMPLANT
DRAPE EXTREMITY T 121X128X90 (DRAPE) ×3 IMPLANT
DRAPE INCISE IOBAN 66X45 STRL (DRAPES) ×6 IMPLANT
DRAPE PROXIMA HALF (DRAPES) IMPLANT
DRAPE U-SHAPE 47X51 STRL (DRAPES) ×3 IMPLANT
DRSG ADAPTIC 3X8 NADH LF (GAUZE/BANDAGES/DRESSINGS) ×3 IMPLANT
DRSG PAD ABDOMINAL 8X10 ST (GAUZE/BANDAGES/DRESSINGS) ×3 IMPLANT
DURAPREP 26ML APPLICATOR (WOUND CARE) ×6 IMPLANT
ELECT REM PT RETURN 9FT ADLT (ELECTROSURGICAL) ×3
ELECTRODE REM PT RTRN 9FT ADLT (ELECTROSURGICAL) ×1 IMPLANT
GAUZE SPONGE 4X4 12PLY STRL (GAUZE/BANDAGES/DRESSINGS) ×3 IMPLANT
GLOVE BIOGEL M 7.0 STRL (GLOVE) IMPLANT
GLOVE BIOGEL PI IND STRL 7.5 (GLOVE) IMPLANT
GLOVE BIOGEL PI IND STRL 8.5 (GLOVE) ×5 IMPLANT
GLOVE BIOGEL PI INDICATOR 7.5 (GLOVE)
GLOVE BIOGEL PI INDICATOR 8.5 (GLOVE) ×10
GLOVE SURG ORTHO 8.0 STRL STRW (GLOVE) ×18 IMPLANT
GOWN STRL REUS W/ TWL LRG LVL3 (GOWN DISPOSABLE) ×1 IMPLANT
GOWN STRL REUS W/ TWL XL LVL3 (GOWN DISPOSABLE) ×2 IMPLANT
GOWN STRL REUS W/TWL 2XL LVL3 (GOWN DISPOSABLE) ×3 IMPLANT
GOWN STRL REUS W/TWL LRG LVL3 (GOWN DISPOSABLE) ×3
GOWN STRL REUS W/TWL XL LVL3 (GOWN DISPOSABLE) ×6
HANDPIECE INTERPULSE COAX TIP (DISPOSABLE) ×3
HOOD PEEL AWAY FACE SHEILD DIS (HOOD) ×9 IMPLANT
KIT BASIN OR (CUSTOM PROCEDURE TRAY) ×3 IMPLANT
KIT ROOM TURNOVER OR (KITS) ×3 IMPLANT
KNEE CAPITATED TOTAL 3 IMPLANT
MANIFOLD NEPTUNE II (INSTRUMENTS) ×3 IMPLANT
NEEDLE 22X1 1/2 (OR ONLY) (NEEDLE) ×6 IMPLANT
NS IRRIG 1000ML POUR BTL (IV SOLUTION) ×3 IMPLANT
PACK TOTAL JOINT (CUSTOM PROCEDURE TRAY) ×3 IMPLANT
PACK UNIVERSAL I (CUSTOM PROCEDURE TRAY) ×3 IMPLANT
PAD ARMBOARD 7.5X6 YLW CONV (MISCELLANEOUS) ×6 IMPLANT
PADDING CAST COTTON 6X4 STRL (CAST SUPPLIES) ×3 IMPLANT
SET HNDPC FAN SPRY TIP SCT (DISPOSABLE) ×1 IMPLANT
SPONGE GAUZE 4X4 12PLY STER LF (GAUZE/BANDAGES/DRESSINGS) ×2 IMPLANT
STAPLER VISISTAT 35W (STAPLE) ×3 IMPLANT
SUCTION FRAZIER HANDLE 10FR (MISCELLANEOUS) ×2
SUCTION TUBE FRAZIER 10FR DISP (MISCELLANEOUS) ×1 IMPLANT
SUT BONE WAX W31G (SUTURE) ×3 IMPLANT
SUT VIC AB 0 CTB1 27 (SUTURE) ×6 IMPLANT
SUT VIC AB 1 CT1 27 (SUTURE) ×6
SUT VIC AB 1 CT1 27XBRD ANBCTR (SUTURE) ×2 IMPLANT
SUT VIC AB 2-0 CT1 27 (SUTURE) ×6
SUT VIC AB 2-0 CT1 TAPERPNT 27 (SUTURE) ×2 IMPLANT
SYR 20CC LL (SYRINGE) ×6 IMPLANT
TOWEL OR 17X24 6PK STRL BLUE (TOWEL DISPOSABLE) ×3 IMPLANT
TOWEL OR 17X26 10 PK STRL BLUE (TOWEL DISPOSABLE) ×3 IMPLANT
WATER STERILE IRR 1000ML POUR (IV SOLUTION) ×6 IMPLANT

## 2015-06-19 NOTE — H&P (Signed)
Gary Carroll MRN:  [id number] DOB/SEX:  04/15/1946/male  CHIEF COMPLAINT:  Painful right Knee  HISTORY: Patient is a 70 y.o. male presented with a history of pain in the right knee. Onset of symptoms was gradual starting several years ago with gradually worsening course since that time. Prior procedures on the knee include arthroscopy. Patient has been treated conservatively with over-the-counter NSAIDs and activity modification. Patient currently rates pain in the knee at 9 out of 10 with activity. There is pain at night.  PAST MEDICAL HISTORY: Patient Active Problem List   Diagnosis Date Noted  . Peripheral edema 08/31/2012  . Urinary frequency 08/31/2012  . Diverticulosis 03/23/2012  . Impaired fasting glucose 02/06/2012  . Decreased libido 03/07/2011  . Erectile dysfunction 03/07/2011  . Essential hypertension, benign 12/20/2010  . Mixed hyperlipidemia 12/20/2010  . ALLERGIC RHINITIS, SEASONAL 05/02/2010  . 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 HIATAL HERNIA, HX OF 05/02/2010   Past Medical History  Diagnosis Date  . Bilateral knee pain     OA  . Prediabetes   . Allergic rhinitis   . Hiatal hernia   . Heart murmur   . HLD (hyperlipidemia)   . HTN (hypertension)   . Erectile dysfunction   . Arthritis   . Asthma     seasonal per pt  . GERD (gastroesophageal reflux disease)   . Diverticulosis 02/2012    seen on colonoscopy  . Urinary frequency   . Overactive bladder   . Urinary urgency   . Wears glasses   . Ringing of ears    Past Surgical History  Procedure Laterality Date  . Tonsillectomy    . Finger surgery Right   . Total knee arthroplasty  2012    left (Dr. Lorre Carroll)  . Cholecystectomy    . Colonoscopy  02/2012    diverticulosis; Dr. Deatra Carroll     MEDICATIONS:   Prescriptions prior to admission  Medication Sig Dispense Refill Last Dose  . antiseptic oral rinse (BIOTENE) LIQD 15 mLs by Mouth Rinse route as needed for dry mouth.     Marland Kitchen aspirin 81 MG tablet Take 81 mg  by mouth daily.     Taking  . ibuprofen (ADVIL,MOTRIN) 200 MG tablet Take 200 mg by mouth every 6 (six) hours as needed.     Not Taking  . losartan (COZAAR) 100 MG tablet Take 1 tablet (100 mg total) by mouth daily. 90 tablet 0   . metoprolol tartrate (LOPRESSOR) 25 MG tablet Take 1 tablet (25 mg total) by mouth 2 (two) times daily. 180 tablet 0 Taking  . Multiple Vitamins-Minerals (MULTIVITAL) tablet Take 1 tablet by mouth daily.     Taking  . Omega-3 Fatty Acids (FISH OIL) 1200 MG CAPS Take 2 capsules by mouth 2 (two) times daily.   Taking  . pravastatin (PRAVACHOL) 40 MG tablet Take 1 tablet (40 mg total) by mouth daily. 90 tablet 0   . sodium chloride (OCEAN) 0.65 % SOLN nasal spray Place 1 spray into both nostrils as needed for congestion.       ALLERGIES:   Allergies  Allergen Reactions  . Diovan [Valsartan] Cough  . Tagamet [Cimetidine] Other (See Comments)    irrittability    REVIEW OF SYSTEMS:  Pertinent items are noted in HPI.   FAMILY HISTORY:   Family History  Problem Relation Age of Onset  . Uterine cancer Mother   . Cancer Mother     uterine  . Coronary artery disease Father   .  Coronary artery disease Paternal Grandfather   . Heart disease Paternal Grandfather   . Colon cancer Neg Hx   . Esophageal cancer Neg Hx   . Stomach cancer Neg Hx   . Rectal cancer Neg Hx     SOCIAL HISTORY:   Social History  Substance Use Topics  . Smoking status: Former Smoker    Types: Cigarettes    Quit date: 01/18/2010  . Smokeless tobacco: Never Used     Comment: Smoked for 25 years- quit September 2011.  Passive exposure from wife  . Alcohol Use: 0.6 - 1.2 oz/week    1-2 Glasses of wine per week     Comment: drinks a 1-2 glasses of wine daily     EXAMINATION:  Vital signs in last 24 hours:    General appearance: alert, cooperative and no distress Lungs: clear to auscultation bilaterally Heart: regular rate and rhythm, S1, S2 normal, no murmur, click, rub or  gallop Abdomen: soft, non-tender; bowel sounds normal; no masses,  no organomegaly Extremities: extremities normal, atraumatic, no cyanosis or edema and Homans sign is negative, no sign of DVT Pulses: 2+ and symmetric Skin: Skin color, texture, turgor normal. No rashes or lesions Neurologic: Alert and oriented X 3, normal strength and tone. Normal symmetric reflexes. Normal coordination and gait  Musculoskeletal:  ROM 0-110, Ligaments intact,  Imaging Review Plain radiographs demonstrate severe degenerative joint disease of the right knee. The overall alignment is mild varus. The bone quality appears to be excellent for age and reported activity level.  Assessment/Plan: Primary osteoarthritis, right knee   The patient history, physical examination and imaging studies are consistent with advanced degenerative joint disease of the right knee. The patient has failed conservative treatment.  The clearance notes were reviewed.  After discussion with the patient it was felt that Total Knee Replacement was indicated. The procedure,  risks, and benefits of total knee arthroplasty were presented and reviewed. The risks including but not limited to aseptic loosening, infection, blood clots, vascular injury, stiffness, patella tracking problems complications among others were discussed. The patient acknowledged the explanation, agreed to proceed with the plan.  Gary Carroll 06/19/2015, 6:30 AM

## 2015-06-19 NOTE — Progress Notes (Signed)
Orthopedic Tech Progress Note Patient Details:  Mickle [facility]ovidio [facility] 04-11-1946 [id number] On cpm at 1915 Patient ID: Edwyna Perfect, male   DOB: 07-Aug-1945, 70 y.o.   MRN: [id number]   Braulio Bosch 06/19/2015, 7:18 PM

## 2015-06-19 NOTE — Progress Notes (Signed)
Pt has met d/c criteria to leave PACU. Unable to transfer to 5 north until post-op orders are available. 

## 2015-06-19 NOTE — Anesthesia Preprocedure Evaluation (Addendum)
Anesthesia Evaluation  Patient identified by MRN, date of birth, ID band Patient awake    Reviewed: Allergy & Precautions, NPO status , Patient's Chart, lab work & pertinent test results, reviewed documented beta blocker date and time   History of Anesthesia Complications Negative for: history of anesthetic complications  Airway Mallampati: II  TM Distance: >3 FB Neck ROM: Full    Dental  (+) Teeth Intact, Dental Advisory Given   Pulmonary asthma , former smoker,    Pulmonary exam normal breath sounds clear to auscultation       Cardiovascular hypertension, Pt. on medications and Pt. on home beta blockers Normal cardiovascular exam Rhythm:Regular Rate:Normal  06/08/15 EKG: NSR.  05/16/10 Nuclear stress test: Overall Impression: Normal lexiscan nuclear study with inferior thinning but no ischemia. LVEF 67%.   Neuro/Psych negative neurological ROS  negative psych ROS   GI/Hepatic Neg liver ROS, GERD  ,  Endo/Other  Obesity   Renal/GU negative Renal ROS     Musculoskeletal  (+) Arthritis , Osteoarthritis,    Abdominal   Peds  Hematology negative hematology ROS (+)   Anesthesia Other Findings Day of surgery medications reviewed with the patient.  Reproductive/Obstetrics                           Anesthesia Physical Anesthesia Plan  ASA: II  Anesthesia Plan: Spinal and Regional   Post-op Pain Management: MAC Combined w/ Regional for Post-op pain   Induction: Intravenous  Airway Management Planned: Nasal Cannula  Additional Equipment: None  Intra-op Plan:   Post-operative Plan:   Informed Consent: I have reviewed the patients History and Physical, chart, labs and discussed the procedure including the risks, benefits and alternatives for the proposed anesthesia with the patient or authorized representative who has indicated his/her understanding and acceptance.   Dental advisory  given  Plan Discussed with: CRNA, Anesthesiologist and Surgeon  Anesthesia Plan Comments: (Discussed risks and benefits of and differences between spinal and general. Discussed risks of spinal including headache, backache, failure, bleeding, infection, and nerve damage. Patient consents to spinal. Questions answered. Coagulation studies and platelet count acceptable.  Spinal plus Adductor Canal block.)       Anesthesia Quick Evaluation

## 2015-06-19 NOTE — Anesthesia Procedure Notes (Addendum)
Anesthesia Regional Block:  Adductor canal block  Pre-Anesthetic Checklist: ,, timeout performed, Correct Patient, Correct Site, Correct Laterality, Correct Procedure, Correct Position, site marked, Risks and benefits discussed,  Surgical consent,  Pre-op evaluation,  At surgeon's request and post-op pain management  Laterality: Right  Prep: chloraprep       Needles:  Injection technique: Single-shot  Needle Type: Echogenic Needle     Needle Length: 9cm 9 cm Needle Gauge: 21 and 21 G    Additional Needles:  Procedures: ultrasound guided (picture in chart) Adductor canal block Narrative:  Injection made incrementally with aspirations every 5 mL.  Performed by: Personally  Anesthesiologist: Catalina Gravel  Additional Notes: No pain on injection. No increased resistance to injection. Injection made in 5cc increments.  Good needle visualization.  Patient tolerated procedure well.   Procedure Name: MAC Date/Time: 06/19/2015 8:48 AM Performed by: Suzy Bouchard Pre-anesthesia Checklist: Patient identified, Emergency Drugs available, Suction available, Patient being monitored and Timeout performed Patient Re-evaluated:Patient Re-evaluated prior to inductionOxygen Delivery Method: Nasal cannula    Spinal Patient location during procedure: OR Start time: 06/19/2015 8:34 AM End time: 06/19/2015 8:37 AM Staffing Anesthesiologist: Catalina Gravel Performed by: anesthesiologist  Preanesthetic Checklist Completed: patient identified, surgical consent, pre-op evaluation, timeout performed, IV checked, risks and benefits discussed and monitors and equipment checked Spinal Block Patient position: sitting Prep: site prepped and draped and DuraPrep Patient monitoring: continuous pulse ox and blood pressure Approach: midline Location: L3-4 Needle Needle type: Pencan  Needle gauge: 24 G Assessment Sensory level: T6 Additional Notes Functioning IV was confirmed and  monitors were applied. Sterile prep and drape, including hand hygiene, mask and sterile gloves were used. The patient was positioned and the spine was prepped. The skin was anesthetized with lidocaine.  Free flow of clear CSF was obtained prior to injecting local anesthetic into the CSF.  The spinal needle aspirated freely following injection.  The needle was carefully withdrawn.  The patient tolerated the procedure well. Consent was obtained prior to procedure with all questions answered and concerns addressed. Risks including but not limited to bleeding, infection, nerve damage, paralysis, failed block, inadequate analgesia, allergic reaction, high spinal, itching and headache were discussed and the patient wished to proceed.   Hoy Morn, MD

## 2015-06-19 NOTE — Transfer of Care (Signed)
Immediate Anesthesia Transfer of Care Note  Patient: Gary Carroll  Procedure(s) Performed: Procedure(s): TOTAL KNEE ARTHROPLASTY (Right)  Patient Location: PACU  Anesthesia Type:MAC combined with regional for post-op pain  Level of Consciousness: awake, alert  and oriented  Airway & Oxygen Therapy: Patient Spontanous Breathing  Post-op Assessment: Report given to RN and Post -op Vital signs reviewed and stable  Post vital signs: Reviewed and stable  Last Vitals:  Filed Vitals:   06/19/15 0735 06/19/15 0740  BP: 132/62   Pulse: 60 61  Temp:    Resp: 24 25    Complications: No apparent anesthesia complications

## 2015-06-19 NOTE — Progress Notes (Signed)
No post-op orders.  MD & P.A. notified

## 2015-06-19 NOTE — Evaluation (Signed)
Physical Therapy Evaluation Patient Details Name: Gary Carroll MRN: [id number] DOB: April 14, 1946 Today's Date: 06/19/2015   History of Present Illness  Admitted for RTKA;  has a past medical history of Bilateral knee pain; Prediabetes; Allergic rhinitis; Hiatal hernia; Heart murmur; HLD (hyperlipidemia); HTN (hypertension); Erectile dysfunction; Arthritis; Asthma; GERD (gastroesophageal reflux disease); Diverticulosis (02/2012); Urinary frequency; Overactive bladder; Urinary urgency; Wears glasses; and Ringing of ears. has past surgical history that includesFinger surgery (Right); Total knee arthroplasty (2012)  Clinical Impression   Pt is s/p TKA resulting in the deficits listed below (see PT Problem List).  Pt will benefit from skilled PT to increase their independence and safety with mobility to allow discharge to the venue listed below.      Follow Up Recommendations Home health PT; Reports the only help he will have will be a neighbor who will check in with him and help with grocery shopping; pt will need to be completely independent to be able to dc home, and with his excellent performance on PT eval, I believe this goal is achievable; will of course monitor for progress    Equipment Recommendations  Rolling walker with 5" wheels;3in1 (PT) (may already have)    Recommendations for Other Services OT consult     Precautions / Restrictions Precautions Precautions: Knee Precaution Comments: Pt educated to not allow any pillow or bolster under knee for healing with optimal range of motion.  Restrictions Weight Bearing Restrictions: Yes RLE Weight Bearing: Weight bearing as tolerated      Mobility  Bed Mobility Overal bed mobility: Needs Assistance Bed Mobility: Supine to Sit     Supine to sit: Supervision;HOB elevated     General bed mobility comments: Smooth transition to sit; used rails  Transfers Overall transfer level: Needs assistance Equipment used: Rolling  walker (2 wheeled) Transfers: Sit to/from Stand Sit to Stand: Min guard         General transfer comment: Cues for hand placement, safety, and control; minguard for safety  Ambulation/Gait Ambulation/Gait assistance: Min guard Ambulation Distance (Feet): 25 Feet Assistive device: Rolling walker (2 wheeled) Gait Pattern/deviations: Step-through pattern     General Gait Details: Cues for gait sequence and to activate R quad for stance stability; nice, stable knee in stance  Stairs            Wheelchair Mobility    Modified Rankin (Stroke Patients Only)       Balance Overall balance assessment: Needs assistance           Standing balance-Leahy Scale: Fair                               Pertinent Vitals/Pain Pain Assessment: 0-10 Pain Score: 3  Pain Location: R knee Pain Descriptors / Indicators: Aching Pain Intervention(s): Monitored during session    Home Living Family/patient expects to be discharged to:: Private residence Living Arrangements: Alone Available Help at Discharge: Neighbor (daily check ins) Type of Home: Apartment Home Access: Stairs to enter Entrance Stairs-Rails: Right Entrance Stairs-Number of Steps: 4 Home Layout: One level Home Equipment: Walker - 2 wheels;Bedside commode (need to verify this)      Prior Function Level of Independence: Independent               Hand Dominance        Extremity/Trunk Assessment   Upper Extremity Assessment: Defer to OT evaluation;Overall [facility] for tasks assessed  Lower Extremity Assessment: RLE deficits/detail RLE Deficits / Details: Grossly decr AROM and strength; quad set present; min assist for straight leg raise; flex to approx 80 deg       Communication   Communication: No difficulties  Cognition Arousal/Alertness: Awake/alert Behavior During Therapy: WFL for tasks assessed/performed Overall Cognitive Status: Within Functional Limits for tasks  assessed                      General Comments      Exercises        Assessment/Plan    PT Assessment Patient needs continued PT services  PT Diagnosis Difficulty walking;Acute pain   PT Problem List Decreased strength;Decreased range of motion;Decreased activity tolerance;Decreased balance;Decreased mobility;Decreased knowledge of use of DME;Decreased knowledge of precautions;Pain  PT Treatment Interventions DME instruction;Gait training;Stair training;Functional mobility training;Therapeutic activities;Therapeutic exercise;Patient/family education   PT Goals (Current goals can be found in the Care Plan section) Acute Rehab PT Goals Patient Stated Goal: to be able to go home independently PT Goal Formulation: With patient Time For Goal Achievement: 06/26/15 Potential to Achieve Goals: Good    Frequency 7X/week   Barriers to discharge Decreased caregiver support Must be completely independent to dc home    Co-evaluation               End of Session Equipment Utilized During Treatment: Gait belt Activity Tolerance: Patient tolerated treatment well Patient left: in chair;with call bell/phone within reach Nurse Communication: Mobility status;Other (comment) (IV beep, need for [facility])         Time: 1540-1559 PT Time Calculation (min) (ACUTE ONLY): 19 min   Charges:   PT Evaluation $PT Eval Moderate Complexity: 1 Procedure     PT G CodesRoney Marion Hamff 06/19/2015, 4:29 PM  [name], [place]  Acute Rehabilitation Services Pager [phone] Office [phone]

## 2015-06-20 ENCOUNTER — Encounter (HOSPITAL_COMMUNITY): Payer: Self-pay | Admitting: Orthopedic Surgery

## 2015-06-20 LAB — CBC
HEMATOCRIT: 42.8 % (ref 39.0–52.0)
HEMOGLOBIN: 14.5 g/dL (ref 13.0–17.0)
MCH: 29.9 pg (ref 26.0–34.0)
MCHC: 33.9 g/dL (ref 30.0–36.0)
MCV: 88.2 fL (ref 78.0–100.0)
Platelets: 191 10*3/uL (ref 150–400)
RBC: 4.85 MIL/uL (ref 4.22–5.81)
RDW: 13.8 % (ref 11.5–15.5)
WBC: 11.4 10*3/uL — ABNORMAL HIGH (ref 4.0–10.5)

## 2015-06-20 LAB — BASIC METABOLIC PANEL
ANION GAP: 10 (ref 5–15)
BUN: 21 mg/dL — AB (ref 6–20)
CHLORIDE: 105 mmol/L (ref 101–111)
CO2: 22 mmol/L (ref 22–32)
Calcium: 8.7 mg/dL — ABNORMAL LOW (ref 8.9–10.3)
Creatinine, Ser: 1.36 mg/dL — ABNORMAL HIGH (ref 0.61–1.24)
GFR calc Af Amer: 59 mL/min — ABNORMAL LOW (ref >60)
GFR calc non Af Amer: 51 mL/min — ABNORMAL LOW (ref >60)
GLUCOSE: 142 mg/dL — AB (ref 65–99)
POTASSIUM: 4.1 mmol/L (ref 3.5–5.1)
Sodium: 137 mmol/L (ref 135–145)

## 2015-06-20 MED ORDER — LOSARTAN POTASSIUM 50 MG PO TABS
100.0000 mg | ORAL_TABLET | Freq: Every day | ORAL | Status: DC
  Administered 2015-06-20 – 2015-06-23 (×4): 100 mg via ORAL
  Filled 2015-06-20 (×4): qty 2

## 2015-06-20 MED ORDER — OXYCODONE HCL 5 MG PO TABS: 5.0000 mg | ORAL_TABLET | ORAL | Status: DC | PRN

## 2015-06-20 MED ORDER — CARISOPRODOL 350 MG PO TABS: 350.0000 mg | ORAL_TABLET | Freq: Four times a day (QID) | ORAL | Status: DC

## 2015-06-20 MED ORDER — ENOXAPARIN SODIUM 40 MG/0.4ML ~~LOC~~ SOLN: 40.0000 mg | SUBCUTANEOUS | Status: DC

## 2015-06-20 MED ORDER — METOCLOPRAMIDE HCL 5 MG PO TABS: 5.0000 mg | ORAL_TABLET | Freq: Three times a day (TID) | ORAL | Status: DC | PRN

## 2015-06-20 MED ORDER — OXYCODONE HCL ER 10 MG PO T12A: 10.0000 mg | EXTENDED_RELEASE_TABLET | Freq: Two times a day (BID) | ORAL | Status: DC

## 2015-06-20 MED ORDER — METOPROLOL TARTRATE 25 MG PO TABS
25.0000 mg | ORAL_TABLET | Freq: Two times a day (BID) | ORAL | Status: DC
  Administered 2015-06-20 – 2015-06-23 (×7): 25 mg via ORAL
  Filled 2015-06-20 (×7): qty 1

## 2015-06-20 NOTE — Progress Notes (Signed)
Physical Therapy Treatment Patient Details Name: Gary Carroll MRN: [id number] DOB: 1946/04/20 Today's Date: 06/20/2015    History of Present Illness Admitted for RTKA;  has a past medical history of Bilateral knee pain; Prediabetes; Allergic rhinitis; Hiatal hernia; Heart murmur; HLD (hyperlipidemia); HTN (hypertension); Erectile dysfunction; Arthritis; Asthma; GERD (gastroesophageal reflux disease); Diverticulosis (02/2012); Urinary frequency; Overactive bladder; Urinary urgency; Wears glasses; and Ringing of ears. has past surgical history that includesFinger surgery (Right); Total knee arthroplasty (2012)    PT Comments    Gary Carroll presents this morning with significant fatigue, exhausted from not sleeping well; He was incontinent of urine in the bed; In general, the quality of his movement today is much less efficient, needing more time to initiate tasks, and cues for problem-solving; He required min physical assist to boost with sit to stand (he will not have physical assist at home);   All of the above points to the need for more practice with PT and OT before going home alone; I would like another day inpatient with him for more training if possible.   Follow Up Recommendations  Home health PT;Other (comment) (I anticipate with more opportunity for training and therapies acutely, he will progress well enough to dc home alone and independently; Will need to see significant progress over this session -- if slow progress, must consider SNF; discussed with Case Manager)     Equipment Recommendations  Rolling walker with 5" wheels;3in1 (PT) (may already have)    Recommendations for Other Services OT consult     Precautions / Restrictions Precautions Precautions: Knee Precaution Booklet Issued: Yes (comment) Precaution Comments: Pt educated to not allow any pillow or bolster under knee for healing with optimal range of motion.  Restrictions Weight Bearing Restrictions:  Yes RLE Weight Bearing: Weight bearing as tolerated    Mobility  Bed Mobility Overal bed mobility: Needs Assistance Bed Mobility: Supine to Sit     Supine to sit: Min assist     General bed mobility comments: Cues for technique and hand palcement; once in sitting, noted difficulty with weight shifting to reciprocally scoot to EOB; increased time  Transfers Overall transfer level: Needs assistance Equipment used: Rolling walker (2 wheeled) Transfers: Sit to/from Stand Sit to Stand: Min assist         General transfer comment: Cues for hand placement, safety, and control; min Assist for safety and initial boost up; Noted he took extra time to figure out hand placement  Ambulation/Gait Ambulation/Gait assistance: Min guard (with and without physical contact) Ambulation Distance (Feet): 20 Feet Assistive device: Rolling walker (2 wheeled) Gait Pattern/deviations: Decreased step length - right;Decreased step length - left;Trunk flexed Gait velocity: quite slow   General Gait Details: Much shorter steps this session with much less efficient gait and trunk flexed posture; significantly decr activity tolerance; Cues for gait sequence and to activate R quad for stance stability; Still nice, stable knee in stance   Stairs            Wheelchair Mobility    Modified Rankin (Stroke Patients Only)       Balance             Standing balance-Leahy Scale: Poor (dependent on UE support)                      Cognition Arousal/Alertness: Awake/alert Behavior During Therapy: WFL for tasks assessed/performed Overall Cognitive Status: Within Functional Limits for tasks assessed  Exercises Total Joint Exercises Quad Sets: AROM;Right;10 reps Short Arc Quad: AROM;Right;10 reps Heel Slides: AAROM;AROM;Right;10 reps Straight Leg Raises: AAROM;Right;10 reps Goniometric ROM: approx 0-85 deg    General Comments        Pertinent  Vitals/Pain Pain Assessment: Faces Faces Pain Scale: Hurts even more Pain Location: Reported back pain while lying in bed; once in standing, R knee pain; hiccups during session, as well as reports of indigestion; RN gave Maalox-type med (thanks!) Pain Descriptors / Indicators: Aching (also indigestion) Pain Intervention(s): Limited activity within patient's tolerance;Monitored during session;Patient requesting pain meds-RN notified    Home Living                      Prior Function            PT Goals (current goals can now be found in the care plan section) Acute Rehab PT Goals Patient Stated Goal: to be able to go home independently PT Goal Formulation: With patient Time For Goal Achievement: 06/26/15 Potential to Achieve Goals: Good Progress towards PT goals: Not progressing toward goals - comment (Limited by fatigue am session; did not sleep well last night)    Frequency  7X/week    PT Plan Other (comment) (I anticipate with more opportunity for training and therapies acutely, he will progress well enough to dc home alone and independently; Will need to see significant progress over this session -- if slow progress, must consider SNF; discussed with Case Manager)    Co-evaluation             End of Session Equipment Utilized During Treatment: Gait belt Activity Tolerance: Patient tolerated treatment well Patient left: in chair;with call bell/phone within reach;with nursing/sitter in room     Time: 0923-0953 PT Time Calculation (min) (ACUTE ONLY): 30 min  Charges:  $Gait Training: 8-22 mins $Therapeutic Exercise: 8-22 mins                    G Codes:      [name] 06/20/2015, 10:08 AM  [name], [place] Pager [phone] Office [phone]

## 2015-06-20 NOTE — Op Note (Signed)
PATIENT ID:      Gary Carroll  MRN:     [id number] DOB/AGE:    March 12, 1946 / 70 y.o.                                             _____________________________           [name] ASSISTANT NOTE        I assisted Surgeon:  [name], MD   ON THE PROCEDURE:  Procedure(s): Right TOTAL KNEE ARTHROPLASTY on 06/19/2015    I provided my assistance on this case for assistance with exposure, retraction, bleeding    control, instrumentation, and closure         Electronicallly signed by [place] Alan Phat [place] 06/20/2015  6:52 AM

## 2015-06-20 NOTE — Progress Notes (Signed)
Physical Therapy Treatment Patient Details Name: Gary Carroll MRN: [id number] DOB: [birth year] Today's Date: 06/20/2015    History of Present Illness Admitted for RTKA;  has a past medical history of Bilateral knee pain; Prediabetes; Allergic rhinitis; Hiatal hernia; Heart murmur; HLD (hyperlipidemia); HTN (hypertension); Erectile dysfunction; Arthritis; Asthma; GERD (gastroesophageal reflux disease); Diverticulosis (02/2012); Urinary frequency; Overactive bladder; Urinary urgency; Wears glasses; and Ringing of ears. has past surgical history that includesFinger surgery (Right); Total knee arthroplasty (2012)    PT Comments    Patient demonstrated improved mobility this session but balance deficits and activity tolerance continue to limit mobility. Pt's frequent incontinence poses a safety issue for mobility and remains a fall risk. Due to mobility level, incontinence, and pt's lack of assistance at home, recommending SNF for further skilled PT services.    Follow Up Recommendations  SNF     Equipment Recommendations       Recommendations for Other Services OT consult     Precautions / Restrictions Precautions Precautions: Knee Precaution Booklet Issued: Yes (comment) Precaution Comments:  Restrictions Weight Bearing Restrictions: Yes RLE Weight Bearing: Weight bearing as tolerated    Mobility  Bed Mobility Overal bed mobility: Needs Assistance Bed Mobility: Supine to Sit     Supine to sit: Min assist     General bed mobility comments: OOB in chair upon arrival  Transfers Overall transfer level: Needs assistance Equipment used: Rolling walker (2 wheeled) Transfers: Sit to/from Stand Sit to Stand: Min guard;Min assist         General transfer comment: carry over of safe hand placement; vc for technique; min A to achieve upright posture and transitioning hand placement form chair to RW  Ambulation/Gait Ambulation/Gait assistance: Min guard Ambulation  Distance (Feet): 150 Feet Assistive device: Rolling walker (2 wheeled) Gait Pattern/deviations: Step-through pattern;Decreased stride length;Trunk flexed   Gait velocity interpretation: Below normal speed for age/gender General Gait Details: vc fro position of RW and posture; pt became fatigued toward end of session with tendency to lean on RW and increase trunk flexion and distance from RW   Stairs Stairs: Yes Stairs assistance: Min guard Stair Management: One rail Right;Sideways Number of Stairs: 3 General stair comments: educated pt on technique and sequencing with vc for safe foot position; pt with tendency to lean on rail and required verbal and tactile cues to safely use railing for UE support  Wheelchair Mobility    Modified Rankin (Stroke Patients Only)       Balance Overall balance assessment: Needs assistance Sitting-balance support: No upper extremity supported;Feet supported Sitting balance-Leahy Scale: Fair     Standing balance support: Bilateral upper extremity supported Standing balance-Leahy Scale: Poor (dependent on UE support) Standing balance comment: Unable to maintain balance to void bladder while standing at toilet. LOB and required min assist to regain balance                    Cognition Arousal/Alertness: Awake/alert Behavior During Therapy: WFL for tasks assessed/performed Overall Cognitive Status: Within Functional Limits for tasks assessed                      Exercises      General Comments General comments (skin integrity, edema, etc.): pt unable to lift R LE out of bone foam without assistance; pt's frequency of incontinence poses a safety issue for mobility and possible skin break down; pt with condom catheter on which came off during session and right after pt urinated on  floor but was unaware of doing so; spoke with pt about use of depends       Pertinent Vitals/Pain Pain Assessment: 0-10 Pain Score: 2  Pain Location: R  knee Pain Descriptors / Indicators: Sore (with mobility) Pain Intervention(s): Limited activity within patient's tolerance;Monitored during session;Premedicated before session    Home Living Family/patient expects to be discharged to:: Private residence Living Arrangements: Alone Available Help at Discharge: Neighbor (check-ins as needed) Type of Home: Apartment Home Access: Stairs to enter Entrance Stairs-Rails: Right Home Layout: One level Home Equipment: Environmental consultant - 2 wheels;Bedside commode;Cane - single point;Hand held shower head;Shower seat;Adaptive equipment      Prior Function Level of Independence: Independent      Comments: Has issues with incontinence prior to surgery - has been using depends recently   PT Goals (current goals can now be found in the care plan section) Acute Rehab PT Goals Patient Stated Goal: to be able to go home independently Progress towards PT goals: Progressing toward goals    Frequency  7X/week    PT Plan Other (comment)     Co-evaluation             End of Session Equipment Utilized During Treatment: Gait belt Activity Tolerance: Patient tolerated treatment well Patient left: in chair;with call bell/phone within reach;with nursing/sitter in room     Time: [id number] PT Time Calculation (min) (ACUTE ONLY): 25 min  Charges:  $Gait Training: 23-37 mins                    G Codes:      Salina April, PTA Pager: [phone]   06/20/2015, 4:13 PM

## 2015-06-20 NOTE — Progress Notes (Signed)
Occupational Therapy Evaluation Patient Details Name: Gary Carroll MRN: [id number] DOB: 10/27/1945 Today's Date: 06/20/2015    History of Present Illness Admitted for RTKA;  has a past medical history of Bilateral knee pain; Prediabetes; Allergic rhinitis; Hiatal hernia; Heart murmur; HLD (hyperlipidemia); HTN (hypertension); Erectile dysfunction; Arthritis; Asthma; GERD (gastroesophageal reflux disease); Diverticulosis (02/2012); Urinary frequency; Overactive bladder; Urinary urgency; Wears glasses; and Ringing of ears. has past surgical history that includesFinger surgery (Right); Total knee arthroplasty (2012)   Clinical Impression   PTA, pt was independent with all ADLs and mobility. Pt currently requires min assist for all functional transfers and LB ADLs. Pt was severely incontinent in bed on OT arrival and claimed he could not feel being soaked in urine. Pt continued to urinate small amounts while ambulating to, in and from the bathroom depiste using the urinal jug x10 times before standing. Pt demonstrates decreased safety awareness in regards to fall risk his incontinence poses. Discussed using Depends at home to minimize amount of laundry and cleaning this would cause at home. Recommending SNF for post-acute stay due to pt's decreased safety awareness, incontinence issues, balance impairments, and lack of any assistance at home.    Follow Up Recommendations  SNF;Supervision/Assistance - 24 hour    Equipment Recommendations  None recommended by OT    Recommendations for Other Services       Precautions / Restrictions Precautions Precautions: Knee Precaution Booklet Issued: No Precaution Comments: Reviewed not keeping pillow or other object under knee Restrictions Weight Bearing Restrictions: Yes RLE Weight Bearing: Weight bearing as tolerated      Mobility Bed Mobility Overal bed mobility: Needs Assistance Bed Mobility: Supine to Sit     Supine to sit: Min  assist     General bed mobility comments: HOB flat, use of bedrails. Min assist to support trunk to come to sitting position.  Transfers Overall transfer level: Needs assistance Equipment used: Rolling walker (2 wheeled) Transfers: Sit to/from Stand Sit to Stand: Mod assist         General transfer comment: Mod assist for boost to stand to stabilize RW. Pt with 1 LOB while standing at toilet to void bladder and required min assist to regain balance. Verbal cues for safe hand placement on seated surfaces x3.    Balance Overall balance assessment: Needs assistance Sitting-balance support: No upper extremity supported;Feet supported Sitting balance-Leahy Scale: Fair     Standing balance support: Bilateral upper extremity supported;During functional activity Standing balance-Leahy Scale: Poor Standing balance comment: Unable to maintain balance to void bladder while standing at toilet. LOB and required min assist to regain balance                            ADL Overall ADL's : Needs assistance/impaired     Grooming: Wash/dry hands;Min guard;Standing;Cueing for safety Grooming Details (indicate cue type and reason): Cues to bring RW to sink for balance Upper Body Bathing: Set up;Sitting   Lower Body Bathing: Minimal assistance;Sit to/from stand Lower Body Bathing Details (indicate cue type and reason): Assist to wash ankles and knees after incontinence Upper Body Dressing : Set up;Sitting   Lower Body Dressing: Moderate assistance;Sit to/from stand;Cueing for compensatory techniques Lower Body Dressing Details (indicate cue type and reason): Cues to dress R leg first Toilet Transfer: Minimal assistance;Ambulation;BSC;RW;Cueing for safety Toilet Transfer Details (indicate cue type and reason): Standing to void bladder Toileting- Clothing Manipulation and Hygiene: Min guard;Sit to/from stand  Functional mobility during ADLs: Minimal assistance;Rolling  walker;Cueing for safety General ADL Comments: Pt incontinent of urine in bed on OT arrival. Pt continued to be incontinent of urine ambulating into bathroom, ambulating to sink after voiding and missing toilet, and while ambulating back to chair in room. Pt reports that he cannot feel when he needs to urinate and also cannot feel when he has urinated on his clothes or in the bed. Discussed use of Depends at home for safety and to avoid skin breakdown from urine being on the skin for long periods of time.      Vision Vision Assessment?: No apparent visual deficits   Perception     Praxis      Pertinent Vitals/Pain Pain Assessment: 0-10 Pain Score: 4  Pain Location: back Pain Descriptors / Indicators: Aching Pain Intervention(s): Limited activity within patient's tolerance;Monitored during session;Repositioned;Ice applied     Hand Dominance Right   Extremity/Trunk Assessment Upper Extremity Assessment Upper Extremity Assessment: Overall WFL for tasks assessed   Lower Extremity Assessment Lower Extremity Assessment: RLE deficits/detail RLE Deficits / Details: Decreased AROM and strength as expected post op   Cervical / Trunk Assessment Cervical / Trunk Assessment: Normal   Communication Communication Communication: No difficulties   Cognition Arousal/Alertness: Awake/alert Behavior During Therapy: Flat affect Overall Cognitive Status: Within Functional Limits for tasks assessed                     General Comments       Exercises       Shoulder Instructions      Home Living Family/patient expects to be discharged to:: Private residence Living Arrangements: Alone Available Help at Discharge: Neighbor (check-ins as needed) Type of Home: Apartment Home Access: Stairs to enter [employer] of Steps: 5-6 Entrance Stairs-Rails: Right Home Layout: One level     Bathroom Shower/Tub: Tub/shower unit;Curtain Shower/tub characteristics:  Architectural technologist: Standard Bathroom Accessibility: Yes How Accessible: Accessible via walker Home Equipment: [place] - 2 wheels;Bedside commode;Cane - single point;Hand held shower head;Shower seat;Adaptive equipment Adaptive Equipment: Reacher        Prior Functioning/Environment Level of Independence: Independent        Comments: Has issues with incontinence prior to surgery - has been using depends recently    OT Diagnosis: Acute pain   OT Problem List: Decreased strength;Decreased range of motion;Impaired balance (sitting and/or standing);Decreased activity tolerance;Decreased coordination;Decreased safety awareness;Decreased knowledge of use of DME or AE;Decreased knowledge of precautions;Obesity;Pain;Other (comment) (Incontinence of bladder)   OT Treatment/Interventions: Self-care/ADL training;Therapeutic exercise;Energy conservation;DME and/or AE instruction;Therapeutic activities;Patient/family education;Balance training    OT Goals(Current goals can be found in the care plan section) Acute Rehab OT Goals Patient Stated Goal: to be able to go home independently OT Goal Formulation: With patient Time For Goal Achievement: 07/04/15 Potential to Achieve Goals: Good ADL Goals Pt Will Perform Grooming: with modified independence;standing Pt Will Perform Lower Body Bathing: with modified independence;with adaptive equipment;sitting/lateral leans;sit to/from stand Pt Will Perform Lower Body Dressing: with modified independence;sitting/lateral leans;with adaptive equipment;sit to/from stand Pt Will Transfer to Toilet: with modified independence;ambulating;bedside commode (with BSC over toilet) Pt Will Perform Toileting - Clothing Manipulation and hygiene: with modified independence;sit to/from stand;sitting/lateral leans Pt Will Perform Tub/Shower Transfer: Tub transfer;with modified independence;ambulating;shower seat;rolling walker Additional ADL Goal #1: Pt will  independently use urinal jug before each transfer to decrease risk of incontinence and decrease fall risk during ambulation.  OT Frequency: Min 3X/week   Barriers to D/C: Decreased caregiver  support  Occassional check-ins from pt's neighbor       Co-evaluation              End of Session Equipment Utilized During Treatment: Gait belt;Rolling walker CPM Right Knee CPM Right Knee: Off Nurse Communication: Mobility status;Other (comment) (Pt's urinated in room and was found laying in urine on arriv)  Activity Tolerance: Patient tolerated treatment well Patient left: in chair;with call bell/phone within reach   Time: 1331-1421 OT Time Calculation (min): 50 min Charges:  OT General Charges $OT Visit: 1 Procedure OT Evaluation $OT Eval Moderate Complexity: 1 Procedure OT Treatments $Self Care/Home Management : 23-37 mins G-Codes:    Redmond Baseman, OTR/L Pager:b [phone] 06/20/2015, 2:47 PM

## 2015-06-20 NOTE — Discharge Instructions (Signed)

## 2015-06-20 NOTE — Progress Notes (Signed)
SPORTS MEDICINE AND JOINT REPLACEMENT  [name], MD   Carlynn Spry, PA-C [place], [place], [place]  [address]                             [phone]   PROGRESS NOTE  Subjective:  negative for Chest Pain  negative for Shortness of Breath  negative for Nausea/Vomiting   negative for Calf Pain  negative for Bowel Movement   Tolerating Diet: yes         Patient reports pain as 6 on 0-10 scale.    Objective: Vital signs in last 24 hours:   Patient Vitals for the past 24 hrs:  BP Temp Temp src Pulse Resp SpO2  06/20/15 0420 138/71 mmHg 98.4 F (36.9 C) Oral 98 18 95 %  06/19/15 2211 (!) 190/108 mmHg 98.3 F (36.8 C) Oral (!) 104 18 91 %  06/19/15 1840 (!) 185/84 mmHg 97.8 F (36.6 C) Oral 94 18 96 %  06/19/15 1312 (!) 143/59 mmHg 97.7 F (36.5 C) Oral 72 18 97 %  06/19/15 1230 - - - - - 100 %  06/19/15 1215 - 98 F (36.7 C) - 72 - 100 %  06/19/15 1200 - - - 72 18 100 %  06/19/15 1145 130/72 mmHg - - 65 19 99 %  06/19/15 1130 128/79 mmHg - - 69 18 100 %  06/19/15 1115 126/72 mmHg - - 72 20 100 %  06/19/15 1100 117/68 mmHg - - 66 18 100 %  06/19/15 1045 109/66 mmHg - - 66 15 98 %  06/19/15 1031 (!) 101/59 mmHg 97.9 F (36.6 C) - 68 20 98 %  06/19/15 0740 - - - 61 (!) 25 96 %  06/19/15 0735 132/62 mmHg - - 60 (!) 24 98 %  06/19/15 0730 (!) 148/69 mmHg - - 64 19 98 %  06/19/15 0725 (!) 145/67 mmHg - - 60 (!) 25 96 %  06/19/15 0720 129/64 mmHg - - (!) 59 (!) 23 97 %    @flow {1959:LAST@   Intake/Output from previous day:   01/30 0701 - 01/31 0700 In: 1450 [I.V.:1400] Out: 1285 [Urine:1210]   Intake/Output this shift:   01/30 1901 - 01/31 0700 In: 50  Out: 100 [Urine:100]   Intake/Output      01/30 0701 - 01/31 0700   I.V. (mL/kg) 1400 (12.2)   IV Piggyback 50   Total Intake(mL/kg) 1450 (12.7)   Urine (mL/kg/hr) 1210 (0.4)   Stool 0 (0)   Blood 75 (0)   Total Output 1285   Net +165       Urine Occurrence 1 x   Stool Occurrence 1 x       LABORATORY DATA:  Recent Labs  06/19/15 1426  WBC 4.8  HGB 13.9  HCT 41.3  PLT 189    Recent Labs  06/19/15 1426  CREATININE 0.81   Lab Results  Component Value Date   INR 1.15 06/08/2015   INR 1.04 05/31/2010    Examination:  General appearance: alert and no distress Extremities: extremities normal, atraumatic, no cyanosis or edema and no ulcers, gangrene or trophic changes  Wound Exam: clean, dry, intact   Drainage:  Scant/small amount normal drainage  Motor Exam: Quadriceps and Hamstrings Intact  Sensory Exam: Deep Peroneal and Tibial normal   Assessment:    1 Day Post-Op  Procedure(s) (LRB): TOTAL KNEE ARTHROPLASTY (Right)  ADDITIONAL DIAGNOSIS:  Active Problems:  S/P total knee arthroplasty  Acute Blood Loss Anemia   Plan: Physical Therapy as ordered Weight Bearing as Tolerated (WBAT)  DVT Prophylaxis:  Lovenox  DISCHARGE PLAN: Home  DISCHARGE NEEDS: HHPT         Donia Ast 06/20/2015, 6:54 AM

## 2015-06-20 NOTE — Op Note (Addendum)
TOTAL KNEE REPLACEMENT OPERATIVE NOTE:  06/19/2015  12:54 PM  PATIENT:  Gary Carroll  70 y.o. male  PRE-OPERATIVE DIAGNOSIS:  PRIMARY OSTEOARTHRITIS RIGHT KNEE  POST-OPERATIVE DIAGNOSIS:  PRIMARY OSTEOARTHRITIS RIGHT KNEE  PROCEDURE:  Procedure(s): TOTAL KNEE ARTHROPLASTY  SURGEON:  Surgeon(s): Vickey Huger, MD  PHYSICIAN ASSISTANT: Carlyon Shadow, [facility]  ANESTHESIA:   spinal  DRAINS: Hemovac  SPECIMEN: None  COUNTS:  Correct  TOURNIQUET:   Total Tourniquet Time Documented: Thigh (Right) - 54 minutes Total: Thigh (Right) - 54 minutes   DICTATION:  Indication for procedure:    The patient is a 70 y.o. male who has failed conservative treatment for PRIMARY OSTEOARTHRITIS RIGHT KNEE.  Informed consent was obtained prior to anesthesia. The risks versus benefits of the operation were explain and in a way the patient can, and did, understand.   On the implant demand matching protocol, this patient scored 10.  Therefore, this patient did" "did not receive a polyethylene insert with vitamin E which is a high demand implant.  Description of procedure:     The patient was taken to the operating room and placed under anesthesia.  The patient was positioned in the usual fashion taking care that all body parts were adequately padded and/or protected.  I foley catheter was not placed.  A tourniquet was applied and the leg prepped and draped in the usual sterile fashion.  The extremity was exsanguinated with the esmarch and tourniquet inflated to 350 mmHg.  Pre-operative range of motion was normal.  The knee was in 5 degree of mild valgus.  A midline incision approximately 6-7 inches long was made with a #10 blade.  A new blade was used to make a parapatellar arthrotomy going 2-3 cm into the quadriceps tendon, over the patella, and alongside the medial aspect of the patellar tendon.  A synovectomy was then performed with the #10 blade and forceps. I then elevated the deep MCL off the  medial tibial metaphysis subperiosteally around to the semimembranosus attachment.    I everted the patella and used calipers to measure patellar thickness.  I used the reamer to ream down to appropriate thickness to recreate the native thickness.  I then removed excess bone with the rongeur and sagittal saw.  I used the appropriately sized template and drilled the three lug holes.  I then put the trial in place and measured the thickness with the calipers to ensure recreation of the native thickness.  The trial was then removed and the patella subluxed and the knee brought into flexion.  A homan retractor was place to retract and protect the patella and lateral structures.  A Z-retractor was place medially to protect the medial structures.  The extra-medullary alignment system was used to make cut the tibial articular surface perpendicular to the anamotic axis of the tibia and in 3 degrees of posterior slope.  The cut surface and alignment jig was removed.  I then used the intramedullary alignment guide to make a 4 valgus cut on the distal femur.  I then marked out the epicondylar axis on the distal femur.  The posterior condylar axis measured 5 degrees.  I then used the anterior referencing sizer and measured the femur to be a size 6.  The 4-In-1 cutting block was screwed into place in external rotation matching the posterior condylar angle, making our cuts perpendicular to the epicondylar axis.  Anterior, posterior and chamfer cuts were made with the sagittal saw.  The cutting block and cut pieces  were removed.  A lamina spreader was placed in 90 degrees of flexion.  The ACL, PCL, menisci, and posterior condylar osteophytes were removed.  A 10 mm spacer blocked was found to offer good flexion and extension gap balance after moderate in degree releasing.   The scoop retractor was then placed and the femoral finishing block was pinned in place.  The small sagittal saw was used as well as the lug drill to  finish the femur.  The block and cut surfaces were removed and the medullary canal hole filled with autograft bone from the cut pieces.  The tibia was delivered forward in deep flexion and external rotation.  A size E tray was selected and pinned into place centered on the medial 1/3 of the tibial tubercle.  The reamer and keel was used to prepare the tibia through the tray.    I then trialed with the size 8 femur, size E tibia, a 10 mm insert and the 38 patella.  I had excellent flexion/extension gap balance, excellent patella tracking.  Flexion was full and beyond 120 degrees; extension was zero.  These components were chosen and the staff opened them to me on the back table while the knee was lavaged copiously and the cement mixed.  The soft tissue was infiltrated with 60cc of exparel 1.3% through a 21 gauge needle.  I cemented in the components and removed all excess cement.  The polyethylene tibial component was snapped into place and the knee placed in extension while cement was hardening.  The capsule was infilltrated with 30cc of .25% Marcaine with epinephrine.  A hemovac was place in the joint exiting superolaterally.  A pain pump was place superomedially superficial to the arthrotomy.  Once the cement was hard, the tourniquet was let down.  Hemostasis was obtained.  The arthrotomy was closed with figure-8 #1 vicryl sutures.  The deep soft tissues were closed with #0 vicryls and the subcuticular layer closed with a running #2-0 vicryl.  The skin was reapproximated and closed with skin staples.  The wound was dressed with xeroform, 4 x4's, 2 ABD sponges, a single layer of webril and a TED stocking.   The patient was then awakened, extubated, and taken to the recovery room in stable condition.  BLOOD LOSS:  300cc DRAINS: 1 hemovac, 1 pain catheter COMPLICATIONS:  None.  PLAN OF CARE: Admit to inpatient   PATIENT DISPOSITION:  PACU - hemodynamically stable.   Delay start of Pharmacological  VTE agent (>24hrs) due to surgical blood loss or risk of bleeding:  not applicable  Please fax a copy of this op note to my office at [phone] (please only include page 1 and 2 of the Case Information op note)

## 2015-06-20 NOTE — Anesthesia Postprocedure Evaluation (Signed)
Anesthesia Post Note  Patient: Gary Carroll  Procedure(s) Performed: Procedure(s) (LRB): TOTAL KNEE ARTHROPLASTY (Right)  Patient location during evaluation: PACU Anesthesia Type: Spinal and Regional Level of consciousness: oriented, awake and alert, patient cooperative and awake Pain management: pain level controlled Vital Signs Assessment: post-procedure vital signs reviewed and stable Respiratory status: spontaneous breathing, respiratory function stable and patient connected to nasal cannula oxygen Cardiovascular status: blood pressure returned to baseline and stable Postop Assessment: no headache, no backache, spinal receding, patient able to bend at knees and no signs of nausea or vomiting Anesthetic complications: no    Last Vitals:  Filed Vitals:   06/19/15 2211 06/20/15 0420  BP: 190/108 138/71  Pulse: 104 98  Temp: 36.8 C 36.9 C  Resp: 18 18    Last Pain:  Filed Vitals:   06/20/15 1859  PainSc: [place] [name]

## 2015-06-21 LAB — CBC
HCT: 40.3 % (ref 39.0–52.0)
Hemoglobin: 13.7 g/dL (ref 13.0–17.0)
MCH: 30.6 pg (ref 26.0–34.0)
MCHC: 34 g/dL (ref 30.0–36.0)
MCV: 90 fL (ref 78.0–100.0)
PLATELETS: 171 10*3/uL (ref 150–400)
RBC: 4.48 MIL/uL (ref 4.22–5.81)
RDW: 14.2 % (ref 11.5–15.5)
WBC: 15.4 10*3/uL — AB (ref 4.0–10.5)

## 2015-06-21 NOTE — Progress Notes (Signed)
Orthopedic Tech Progress Note Patient Details:  [name] 24-Jun-1945 [id number]  Patient ID: Gary Carroll, male   DOB: May 19, 1946, 70 y.o.   MRN: [id number] Placed pt's rle on cpm @ 0-90 degrees @1420   Hildred Priest 06/21/2015, 2:20 PM

## 2015-06-21 NOTE — Progress Notes (Signed)
Physical Therapy Treatment Patient Details Name: Gary Carroll MRN: [id number] DOB: 07-09-1945 Today's Date: 06/21/2015    History of Present Illness Admitted for RTKA;  has a past medical history of Bilateral knee pain; Prediabetes; Allergic rhinitis; Hiatal hernia; Heart murmur; HLD (hyperlipidemia); HTN (hypertension); Erectile dysfunction; Arthritis; Asthma; GERD (gastroesophageal reflux disease); Diverticulosis (02/2012); Urinary frequency; Overactive bladder; Urinary urgency; Wears glasses; and Ringing of ears. has past surgical history that includesFinger surgery (Right); Total knee arthroplasty (2012)    PT Comments    Patient demonstrated increased difficulty with mobility this session requiring more assistance with all functional mobility. Spoke with pt about going to SNF and he appeared agreeable but worried about insurance covering it.  Continue to recommend SNF for further skilled PT services for increased independence with safety.   Follow Up Recommendations  SNF     Equipment Recommendations       Recommendations for Other Services OT consult     Precautions / Restrictions Precautions Precautions: Knee Precaution Booklet Issued: Yes (comment) Restrictions RLE Weight Bearing: Weight bearing as tolerated    Mobility  Bed Mobility Overal bed mobility: Needs Assistance Bed Mobility: Supine to Sit     Supine to sit: Min assist     General bed mobility comments: min A to elevate trunk into sitting; use of bed rails and vc for seqencing  Transfers Overall transfer level: Needs assistance Equipment used: Rolling walker (2 wheeled) Transfers: Sit to/from Stand Sit to Stand: Min assist         General transfer comment: min A to power up into standing and increased time to transition hand placement from EOB to RW and achieve erect posture; vc for hand placement and technique  Ambulation/Gait Ambulation/Gait assistance: Min guard;Min assist Ambulation  Distance (Feet): 125 Feet Assistive device: Rolling walker (2 wheeled) Gait Pattern/deviations: Step-through pattern;Drifts right/left;Wide base of support;Trunk flexed;Decreased stride length;Decreased dorsiflexion - right (drifts left)     General Gait Details: max vc for positioning of RW and maintaining upright posture; pt relies heavily on RW and has tendency to increase trunk flexion and distance from RW every 10-15 ft requiring vc for safe use of AD; pt demonstrated several changes in velocity and drifting to the L requiring min A for guidance of RW   Stairs            Wheelchair Mobility    Modified Rankin (Stroke Patients Only)       Balance     Sitting balance-Leahy Scale: Good       Standing balance-Leahy Scale: Poor (use of RW)                      Cognition Arousal/Alertness: Awake/alert Behavior During Therapy: WFL for tasks assessed/performed Overall Cognitive Status: Within Functional Limits for tasks assessed                      Exercises      General Comments General comments (skin integrity, edema, etc.): pt continues to have indigestion and hiccups that he has been experiencing most of the day; spoke with pt about going to SNF for further rehab before returngin home; pt appeared agreeable but worried about insurance covering it      Pertinent Vitals/Pain Pain Assessment: No/denies pain Pain Intervention(s): Monitored during session    Home Living                      Prior Function  PT Goals (current goals can now be found in the care plan section) Acute Rehab PT Goals Patient Stated Goal: to be able to go home independently Progress towards PT goals: Not progressing toward goals - comment (decreased activity tolerance this session)    Frequency  7X/week    PT Plan Current plan remains appropriate    Co-evaluation             End of Session Equipment Utilized During Treatment: Gait  belt Activity Tolerance: Patient tolerated treatment well Patient left: with call bell/phone within reach;in bed;with SCD's reapplied     Time: [id number] PT Time Calculation (min) (ACUTE ONLY): 36 min  Charges:  $Gait Training: 8-22 mins $Therapeutic Activity: 8-22 mins                    G Codes:      Salina April, PTA Pager: [phone]   06/21/2015, 4:43 PM

## 2015-06-21 NOTE — Progress Notes (Addendum)
Occupational Therapy Treatment Patient Details Name: Gary Carroll MRN: [id number] DOB: 04/26/1946 Today's Date: 06/21/2015    History of present illness Admitted for RTKA;  has a past medical history of Bilateral knee pain; Prediabetes; Allergic rhinitis; Hiatal hernia; Heart murmur; HLD (hyperlipidemia); HTN (hypertension); Erectile dysfunction; Arthritis; Asthma; GERD (gastroesophageal reflux disease); Diverticulosis (02/2012); Urinary frequency; Overactive bladder; Urinary urgency; Wears glasses; and Ringing of ears. has past surgical history that includesFinger surgery (Right); Total knee arthroplasty (2012)   OT comments  Pt not progressing this pm. Unable to get to EOB with bed flat. Unable to lift RLE off bed to remove "zero foam". Pt states " I'm so weak". Pt wants to D/C home but expresses concern over how he will take care of himself at home. States he "wants to find out how much it will cost to go to a SNF for rehab". Nsg notified of pt's concerns. Pt complaining of not having a BM and states he may do better if he can have a BM. Nsg notified. Will see tomorrow to assist with safe D/C home if D/C to SNF is not an option.   Follow Up Recommendations  SNF;Supervision/Assistance - 24 hour    Equipment Recommendations  None recommended by OT    Recommendations for Other Services      Precautions / Restrictions Precautions Precautions: Knee Precaution Booklet Issued: Yes (comment) Restrictions RLE Weight Bearing: Weight bearing as tolerated       Mobility Bed Mobility Overal bed mobility: Needs Assistance Bed Mobility: Supine to Sit     Supine to sit: Mod assist     General bed mobility comments: unable to get to EOB this pm with bed flat. Required bed be elvated to 60 degrees in order to move to EOB.  Transfers Overall transfer level: Needs assistance Equipment used: Rolling walker (2 wheeled) Transfers: Sit to/from [facility] Sit to Stand: Min  assist Stand pivot transfers: Min assist       General transfer comment: min A to powewr up    Balance     Sitting balance-Leahy Scale: Good       Standing balance-Leahy Scale: Poor                     ADL               Lower Body Bathing: Minimal assistance;Sit to/from stand       Lower Body Dressing: Moderate assistance;Sit to/from stand   Toilet Transfer: Minimal assistance;RW;BSC (over toilet)   Toileting- Clothing Manipulation and Hygiene: Minimal assistance       Functional mobility during ADLs: Rolling walker;Cueing for safety;Minimal assistance General ADL Comments: Pt incontinent throughout session. Pt only able to walk to doors in room then to toilet. Mod vc to stay in RW. Pt states "I can barely make it".  Pt required min A to stabilize with pt pushing RW 2 feet in front of him increasing his risk of falling. Asked pt to walk again after going to toilet and pt stated he could only make it back to chair. Will try leg lifter and AE with pt on next visit.       Vision                     Perception     Praxis      Cognition   Behavior During Therapy: [facility] for tasks assessed/performed Overall Cognitive Status: Within Functional Limits for tasks assessed  Extremity/Trunk Assessment               Exercises     Shoulder Instructions       General Comments      Pertinent Vitals/ Pain       Pain Assessment: Faces Faces Pain Scale: Hurts little more Pain Location: R knee Pain Descriptors / Indicators: Grimacing;Discomfort Pain Intervention(s): Limited activity within patient's tolerance  Home Living                                          Prior Functioning/Environment              Frequency Min 3X/week     Progress Toward Goals  OT Goals(current goals can now be found in the care plan section)  Progress towards OT goals: Not progressing toward goals - comment  (limited by generalized weakness)  Acute Rehab OT Goals Patient Stated Goal: to be able to go home independently OT Goal Formulation: With patient Time For Goal Achievement: 07/04/15 Potential to Achieve Goals: Good ADL Goals Pt Will Perform Grooming: with modified independence;standing Pt Will Perform Lower Body Bathing: with modified independence;with adaptive equipment;sitting/lateral leans;sit to/from stand Pt Will Perform Lower Body Dressing: with modified independence;sitting/lateral leans;with adaptive equipment;sit to/from stand Pt Will Transfer to Toilet: with modified independence;ambulating;bedside commode Pt Will Perform Toileting - Clothing Manipulation and hygiene: with modified independence;sit to/from stand;sitting/lateral leans Pt Will Perform Tub/Shower Transfer: Tub transfer;with modified independence;ambulating;shower seat;rolling walker Additional ADL Goal #1: Pt will independently use urinal jug before each transfer to decrease risk of incontinence and decrease fall risk during ambulation.  Plan Discharge plan remains appropriate    Co-evaluation                 End of Session Equipment Utilized During Treatment: Gait belt;Rolling walker   Activity Tolerance Patient limited by fatigue   Patient Left in bed;with call bell/phone within reach;with chair alarm set   Nurse Communication Mobility status;Other (comment) (pt asking to find out cost of SNF)        Time: [id number] OT Time Calculation (min): 33 min  Charges: OT General Charges $OT Visit: 1 Procedure OT Treatments $Self Care/Home Management : 23-37 mins  Perian Tedder,HILLARY 06/21/2015, 4:53 PM   [facility], OTR/L  [id number] 06/21/2015  06/21/2015  [place], OTR/L  [phone] 06/21/2015

## 2015-06-21 NOTE — Progress Notes (Signed)
Physical Therapy Treatment Patient Details Name: Gary Carroll MRN: [id number] DOB: 08-06-1945 Today's Date: 06/21/2015    History of Present Illness Admitted for RTKA;  has a past medical history of Bilateral knee pain; Prediabetes; Allergic rhinitis; Hiatal hernia; Heart murmur; HLD (hyperlipidemia); HTN (hypertension); Erectile dysfunction; Arthritis; Asthma; GERD (gastroesophageal reflux disease); Diverticulosis (02/2012); Urinary frequency; Overactive bladder; Urinary urgency; Wears glasses; and Ringing of ears. has past surgical history that includesFinger surgery (Right); Total knee arthroplasty (2012)    PT Comments    Patient is making progress toward mobility goals with decreased need for physical assist for mobility. Pt's incontinence and frequent urination remain a limiting factor and a safety risk. Therapist requested condom catheter prior to session. Pt with decreased safety awareness this session with transfers. Current plan remains appropriate.  Follow Up Recommendations  SNF     Equipment Recommendations       Recommendations for Other Services OT consult     Precautions / Restrictions Precautions Precautions: Knee Precaution Booklet Issued: Yes (comment) Restrictions RLE Weight Bearing: Weight bearing as tolerated    Mobility  Bed Mobility Overal bed mobility: Needs Assistance Bed Mobility: Supine to Sit     Supine to sit: Min guard     General bed mobility comments: use of bedrails and increased time needed; min guard for safety  Transfers Overall transfer level: Needs assistance Equipment used: Rolling walker (2 wheeled) Transfers: Sit to/from Stand Sit to Stand: Min guard         General transfer comment: vc for hand placement and technique each trial; pt with tendency to prematurely begin sitting; cues for increased safety awareness and use of AD throughout transfers  Ambulation/Gait Ambulation/Gait assistance: Min guard Ambulation  Distance (Feet): 150 Feet Assistive device: Rolling walker (2 wheeled) Gait Pattern/deviations: Step-through pattern;Decreased stride length;Trunk flexed;Decreased dorsiflexion - right   Gait velocity interpretation: Below normal speed for age/gender General Gait Details: mod vc for position of RW with pt increasing distance from RW when becoming fatigued; vc for increased R heel strike and posture   Stairs            Wheelchair Mobility    Modified Rankin (Stroke Patients Only)       Balance Overall balance assessment: Needs assistance Sitting-balance support: No upper extremity supported;Feet supported Sitting balance-Leahy Scale: Good     Standing balance support: Bilateral upper extremity supported Standing balance-Leahy Scale: Poor (use of RW)                      Cognition Arousal/Alertness: Awake/alert Behavior During Therapy: WFL for tasks assessed/performed Overall Cognitive Status: Within Functional Limits for tasks assessed                      Exercises Total Joint Exercises Quad Sets: AROM;Right;10 reps;Supine Heel Slides: AROM;Right;10 reps;Supine Straight Leg Raises: AROM;Right;Supine;5 reps Gary Arc Quad: AROM;Right;10 reps;Seated Goniometric ROM: 0-90    General Comments General comments (skin integrity, edema, etc.): pt continues to c/o indigestion and required condom catheter prior to ambulation due to incontinence and frequent urination      Pertinent Vitals/Pain Pain Assessment: Faces Faces Pain Scale: Hurts a little bit Pain Location: R knee Pain Descriptors / Indicators: Discomfort Pain Intervention(s): Monitored during session;Repositioned    Home Living                      Prior Function  PT Goals (current goals can now be found in the care plan section) Acute Rehab PT Goals Patient Stated Goal: to be able to go home independently Progress towards PT goals: Progressing toward goals     Frequency  7X/week    PT Plan Current plan remains appropriate    Co-evaluation             End of Session Equipment Utilized During Treatment: Gait belt Activity Tolerance: Patient tolerated treatment well Patient left: in chair;with call bell/phone within reach     Time: [id number] PT Time Calculation (min) (ACUTE ONLY): 33 min  Charges:  $Gait Training: 8-22 mins $Therapeutic Exercise: 8-22 mins                    G Codes:      Salina April, PTA Pager: [phone]   06/21/2015, 11:45 AM

## 2015-06-21 NOTE — Progress Notes (Signed)
CSW met with pt re PT recommendation for SNF. Pt refusing SNF at this time and plans to d/c home with [facility] and eventually transition to outpt therapy. Pt reports his neighbor is supportive and able to assist as needed. RNCM aware of [place] needs. CSW signing off as no other CSW needs identified or reported.   Wandra Feinstein, MSW, LCSW

## 2015-06-21 NOTE — Progress Notes (Signed)
SPORTS MEDICINE AND JOINT REPLACEMENT  [name], MD   Carlynn Spry, PA-C [place], [place], [place]  [id number]                             [phone]   PROGRESS NOTE  Subjective:  negative for Chest Pain  negative for Shortness of Breath  negative for Nausea/Vomiting   negative for Calf Pain  negative for Bowel Movement   Tolerating Diet: yes         Patient reports pain as 4 on 0-10 scale.    Objective: Vital signs in last 24 hours:   Patient Vitals for the past 24 hrs:  BP Temp Temp src Pulse Resp SpO2  06/21/15 0809 (!) 143/77 mmHg 97.6 F (36.4 C) Oral 85 18 95 %  06/21/15 0411 (!) 152/73 mmHg 98.3 F (36.8 C) Oral 79 20 96 %  06/20/15 2111 121/70 mmHg 97.5 F (36.4 C) Oral 86 18 92 %    @flow {1959:LAST@   Intake/Output from previous day:   01/31 0701 - 02/01 0700 In: 720 [P.O.:720] Out: -    Intake/Output this shift:       Intake/Output      01/31 0701 - 02/01 0700 02/01 0701 - 02/02 0700   P.O. 720    I.V. (mL/kg)     IV Piggyback     Total Intake(mL/kg) 720 (6.3)    Urine (mL/kg/hr)     Stool     Blood     Total Output       Net +720          Urine Occurrence 1 x       LABORATORY DATA:  Recent Labs  06/19/15 1426 06/20/15 0746 06/21/15 0639  WBC 4.8 11.4* 15.4*  HGB 13.9 14.5 13.7  HCT 41.3 42.8 40.3  PLT 189 191 171    Recent Labs  06/19/15 1426 06/20/15 0746  NA  --  137  K  --  4.1  CL  --  105  CO2  --  22  BUN  --  21*  CREATININE 0.81 1.36*  GLUCOSE  --  142*  CALCIUM  --  8.7*   Lab Results  Component Value Date   INR 1.15 06/08/2015   INR 1.04 05/31/2010    Examination:  General appearance: alert, cooperative and no distress Extremities: Homans sign is negative, no sign of DVT  Wound Exam: clean, dry, intact   Drainage:  None: wound tissue dry  Motor Exam: Quadriceps and Hamstrings Intact  Sensory Exam: Deep Peroneal normal   Assessment:    2 Days Post-Op  Procedure(s)  (LRB): TOTAL KNEE ARTHROPLASTY (Right)  ADDITIONAL DIAGNOSIS:  Active Problems:   S/P total knee arthroplasty  Acute Blood Loss Anemia   Plan: Physical Therapy as ordered Weight Bearing as Tolerated (WBAT)  DVT Prophylaxis:  Lovenox  DISCHARGE PLAN: Home  DISCHARGE NEEDS: HHPT, CPM, Walker and 3-in-1 comode seat         Marguerite Jarboe 06/21/2015, 4:41 PM

## 2015-06-22 ENCOUNTER — Encounter (HOSPITAL_COMMUNITY): Payer: Self-pay | Admitting: General Practice

## 2015-06-22 LAB — CBC
HEMATOCRIT: 38.5 % — AB (ref 39.0–52.0)
HEMOGLOBIN: 12.9 g/dL — AB (ref 13.0–17.0)
MCH: 30.4 pg (ref 26.0–34.0)
MCHC: 33.5 g/dL (ref 30.0–36.0)
MCV: 90.6 fL (ref 78.0–100.0)
Platelets: 184 10*3/uL (ref 150–400)
RBC: 4.25 MIL/uL (ref 4.22–5.81)
RDW: 14.4 % (ref 11.5–15.5)
WBC: 15.2 10*3/uL — ABNORMAL HIGH (ref 4.0–10.5)

## 2015-06-22 MED ORDER — POLYETHYLENE GLYCOL 3350 17 G PO PACK: 17.0000 g | PACK | Freq: Every day | ORAL | Status: DC | PRN

## 2015-06-22 MED ORDER — FLEET ENEMA 7-19 GM/118ML RE ENEM
1.0000 | ENEMA | Freq: Every day | RECTAL | Status: DC | PRN
  Administered 2015-06-22: 1 via RECTAL
  Filled 2015-06-22: qty 1

## 2015-06-22 MED ORDER — FLEET ENEMA 7-19 GM/118ML RE ENEM: 1.0000 | ENEMA | Freq: Every day | RECTAL | Status: DC | PRN

## 2015-06-22 MED ORDER — POLYETHYLENE GLYCOL 3350 17 G PO PACK
17.0000 g | PACK | Freq: Every day | ORAL | Status: DC | PRN
  Administered 2015-06-22: 17 g via ORAL
  Filled 2015-06-22: qty 1

## 2015-06-22 NOTE — Progress Notes (Signed)
Pt c/o abd discomfort stating he felt his abd was burning and on fire. Pt reported nausea, PRN zofran adm. PA notified and new orders received for miralax and fleet enema. Pt back to bed and will closely monitor. Delia Heady RN

## 2015-06-22 NOTE — Progress Notes (Addendum)
SPORTS MEDICINE AND JOINT REPLACEMENT  Gary Mulch, MD   Gary Spry, PA-C [place], [place], [place]  [address]                             [phone]   PROGRESS NOTE  Subjective:  negative for Chest Pain  negative for Shortness of Breath  negative for Nausea/Vomiting   negative for Calf Pain  negative for Bowel Movement   Tolerating Diet: yes         Patient reports pain as 6 on 0-10 scale.    Objective: Vital signs in last 24 hours:   Patient Vitals for the past 24 hrs:  BP Temp Temp src Pulse Resp SpO2  06/22/15 0459 132/67 mmHg 98.8 F (37.1 C) Oral 79 18 94 %  06/21/15 2101 135/73 mmHg 98.7 F (37.1 C) Oral 87 18 94 %    @flow {1959:LAST@   Intake/Output from previous day:   02/01 0701 - 02/02 0700 In: 1000 [P.O.:1000] Out: 100 [Urine:100]   Intake/Output this shift:       Intake/Output      02/01 0701 - 02/02 0700 02/02 0701 - 02/03 0700   P.O. 1000    Total Intake(mL/kg) 1000 (8.7)    Urine (mL/kg/hr) 100 (0)    Total Output 100     Net +900          Urine Occurrence 3 x    Stool Occurrence 1 x       LABORATORY DATA:  Recent Labs  06/19/15 1426 06/20/15 0746 06/21/15 0639 06/22/15 0606  WBC 4.8 11.4* 15.4* 15.2*  HGB 13.9 14.5 13.7 12.9*  HCT 41.3 42.8 40.3 38.5*  PLT 189 191 171 184    Recent Labs  06/19/15 1426 06/20/15 0746  NA  --  137  K  --  4.1  CL  --  105  CO2  --  22  BUN  --  21*  CREATININE 0.81 1.36*  GLUCOSE  --  142*  CALCIUM  --  8.7*   Lab Results  Component Value Date   INR 1.15 06/08/2015   INR 1.04 05/31/2010    Examination:  General appearance: alert, cooperative and no distress Extremities: Homans sign is negative, no sign of DVT  Wound Exam: clean, dry, intact   Drainage:  None: wound tissue dry  Motor Exam: Quadriceps and Hamstrings Intact  Sensory Exam: Deep Peroneal normal   Assessment:    3 Days Post-Op  Procedure(s) (LRB): TOTAL KNEE ARTHROPLASTY  (Right)  ADDITIONAL DIAGNOSIS:  Active Problems:   S/P total knee arthroplasty  Acute Blood Loss Anemia   Plan: Physical Therapy as ordered Weight Bearing as Tolerated (WBAT)  DVT Prophylaxis:  Lovenox  DISCHARGE PLAN: Skilled Nursing Facility/Rehab Patient is unfit andunsafe to be discharged to home at this present time.  DISCHARGE NEEDS: CPM, Walker and 3-in-1 comode seat         Gary Carroll 06/22/2015, 12:34 PM

## 2015-06-22 NOTE — Clinical Social Work Note (Signed)
Clinical Social Work Assessment  Patient Details  Name: Gary Carroll MRN: [id number] Date of Birth: 05-Jun-1945  Date of referral:  06/22/15               Reason for consult:  Facility Placement                Permission sought to share information with:  Chartered certified accountant granted to share information::  Yes, Verbal Permission Granted  Name::      ([place] [facility] as well as extended [employer])  Agency::     Relationship::     Contact Information:     Housing/Transportation Living arrangements for the past 2 months:  [place] of Information:  Patient Patient Interpreter Needed:  None Criminal Activity/Legal Involvement Pertinent to Current Situation/Hospitalization:  No - Comment as needed Significant Relationships:    Lives with:    Do you feel safe going back to the place where you live?  No Need for family participation in patient care:  No (Coment)  Care giving concerns:  No caregivers present at time of discharge.   Social Worker assessment / plan:  CSW met with patient to explain role and possibility of SNF/STR.  Patient initially refused SNF and is now agreeable to begin the SNF process.  Patient states he is from home alone and has limited support.  Patient is aware that the [place] takes additional time to review for possible authorization to SNF and is willing for CSW to initiate extended SNF search for safe discharge.  Patient projected to discharge Friday 2/3 per PA.  Employment status:  Retired Forensic scientist:  [employer] PT Recommendations:  [place] / Referral to community resources:  [place]  Patient/Family's Response to care:  Patient initially refused SNF now agreeable.  Patient/Family's Understanding of and Emotional Response to Diagnosis, Current Treatment, and Prognosis:  Patient is realistic regarding level of care needed at time of discharge.     Emotional Assessment Appearance:  Appears older than stated age Attitude/Demeanor/Rapport:    Affect (typically observed):  Accepting, Adaptable (appropriate) Orientation:  Oriented to Self, Oriented to Place, Oriented to  Time, Oriented to Situation (appropriate) Alcohol / Substance use:  Not Applicable Psych involvement (Current and /or in the community):  No (Comment)  Discharge Needs  Concerns to be addressed:  No discharge needs identified Readmission within the last 30 days:  No Current discharge risk:  None Barriers to Discharge:  No Barriers Identified   Dulcy Fanny, LCSW 06/22/2015, 1:48 PM

## 2015-06-22 NOTE — Clinical Social Work Note (Signed)
CSW sent in [place] packet for SNF approval.  Patient is projected to discharge on 2/3. Unsure if patient will receive insurance authorization prior to being ready for discharge.  CSW has requested the assistance of Asst CSW [place], [employer] production planner for options- possible Letter of Guarantee pending approval by leadership.  Nonnie Done, LCSW [phone]  [id number]; 2S 15-16 and [place] Licensed Clinical Social Worker

## 2015-06-22 NOTE — Clinical Social Work Placement (Signed)
   CLINICAL SOCIAL WORK PLACEMENT  NOTE  Date:  06/22/2015  Patient Details  Name: Gary Carroll MRN: [id number] Date of Birth: 15-Dec-1945  Clinical Social Work is seeking post-discharge placement for this patient at the   level of care (*CSW will initial, date and re-position this form in  chart as items are completed):  Yes   Patient/family provided with [place] Work Department's list of facilities offering this level of care within the geographic area requested by the patient (or if unable, by the patient's family).      Patient/family informed of their freedom to choose among providers that offer the needed level of care, that participate in Medicare, Medicaid or managed care program needed by the patient, have an available bed and are willing to accept the patient.  Yes   Patient/family informed of [place]'s ownership interest in [facility] and [facility], as well as of the fact that they are under no obligation to receive care at these facilities.  PASRR submitted to EDS on 06/22/15     PASRR number received on 06/22/15     Existing PASRR number confirmed on       FL2 transmitted to all facilities in geographic area requested by pt/family on 06/22/15     FL2 transmitted to all facilities within larger geographic area on 06/22/15     Patient informed that his/her managed care company has contracts with or will negotiate with certain facilities, including the following:            Patient/family informed of bed offers received.  Patient chooses bed at       Physician recommends and patient chooses bed at      Patient to be transferred to   on  .  Patient to be transferred to facility by       Patient family notified on   of transfer.  Name of family member notified:        PHYSICIAN       Additional Comment:    _______________________________________________ Dulcy Fanny, LCSW 06/22/2015, 1:52 PM

## 2015-06-22 NOTE — Progress Notes (Addendum)
Physical Therapy Treatment Patient Details Name: Gary Carroll MRN: [id number] DOB: 05-08-1946 Today's Date: 06/22/2015    History of Present Illness Admitted for RTKA;  has a past medical history of Bilateral knee pain; Prediabetes; Allergic rhinitis; Hiatal hernia; Heart murmur; HLD (hyperlipidemia); HTN (hypertension); Erectile dysfunction; Arthritis; Asthma; GERD (gastroesophageal reflux disease); Diverticulosis (02/2012); Urinary frequency; Overactive bladder; Urinary urgency; Wears glasses; and Ringing of ears. has past surgical history that includesFinger surgery (Right); Total knee arthroplasty (2012)    PT Comments    Patient continues to present with indigestion, hiccups, and required adult diaper for ambulation due to incontinence. Pt required less assistance this session for transfers but with decreased activity tolerance and tendency to drift to L side when ambulating requiring min A to guide RW to avoid hitting objects in hallway. Pt unaware of drifting to L. Continue to progress as tolerated.   Follow Up Recommendations  SNF     Equipment Recommendations       Recommendations for Other Services OT consult     Precautions / Restrictions Precautions Precautions: Knee Precaution Booklet Issued: Yes (comment) Restrictions RLE Weight Bearing: Weight bearing as tolerated    Mobility  Bed Mobility Overal bed mobility: Needs Assistance Bed Mobility: Supine to Sit     Supine to sit: Min guard     General bed mobility comments: min guard for safety; vc for sequencing; use of bed rails   Transfers Overall transfer level: Needs assistance Equipment used: Rolling walker (2 wheeled) Transfers: Sit to/from Stand Sit to Stand: Min guard         General transfer comment: vc for hand placement each trial from EOB and BSC; min guard for safety; increased time to achieve upright posture but with better transition of hand placement EOB to  RW  Ambulation/Gait Ambulation/Gait assistance: Min guard;Min assist Ambulation Distance (Feet): 150 Feet Assistive device: Rolling walker (2 wheeled) Gait Pattern/deviations: Step-through pattern;Decreased stride length;Trunk flexed;Drifts right/left (drifts left)     General Gait Details: pt continues to require vc for position of RW with tendecy to push and out in front of him with trunk flexed; vc for maintaining posture and min A to guide RW at times due to pt tendency to drift to L side; pt unaware of drifting to L although needed vc to avoid hitting objects in the hallway   Stairs            Wheelchair Mobility    Modified Rankin (Stroke Patients Only)       Balance Overall balance assessment: Needs assistance   Sitting balance-Leahy Scale: Good     Standing balance support: Bilateral upper extremity supported Standing balance-Leahy Scale: Poor (use of RW)                      Cognition Arousal/Alertness: Awake/alert Behavior During Therapy: WFL for tasks assessed/performed Overall Cognitive Status: Within Functional Limits for tasks assessed                      Exercises      General Comments General comments (skin integrity, edema, etc.): pt continues to present with indigestion,  hiccups, and incontinence and required adult diaper for ambulation      Pertinent Vitals/Pain Pain Assessment: 0-10 Pain Score: 5  Pain Location: R knee with flexion Pain Descriptors / Indicators: Sore Pain Intervention(s): Limited activity within patient's tolerance;Monitored during session;Premedicated before session;Repositioned    Home Living  Prior Function            PT Goals (current goals can now be found in the care plan section) Acute Rehab PT Goals Patient Stated Goal: to be able to go home independently Progress towards PT goals: Progressing toward goals    Frequency  7X/week    PT Plan Current plan  remains appropriate    Co-evaluation             End of Session Equipment Utilized During Treatment: Gait belt Activity Tolerance: Patient tolerated treatment well Patient left: with call bell/phone within reach;in bed     Time: 1442-1516 PT Time Calculation (min) (ACUTE ONLY): 34 min  Charges:  $Gait Training: 8-22 mins $Therapeutic Exercise: 8-22 mins                    G Codes:      Salina April, PTA Pager: [phone]   06/22/2015, 4:41 PM

## 2015-06-22 NOTE — NC FL2 (Signed)
[place] LEVEL OF CARE SCREENING TOOL     IDENTIFICATION  Patient Name: Gary Carroll Birthdate: 05-10-1946 Sex: male Admission Date (Current Location): 06/19/2015  [facility] and [place] Number:  [employer] and Address:  [facility], [place] [address] [place],  [address]      Provider Number: [id number]  Attending Physician Name and Address:  Vickey Huger, MD  Relative Name and Phone Number:       Current Level of Care: Hospital Recommended Level of Care: [place] Prior Approval Number:    Date Approved/Denied:   PASRR Number: [id number]  Discharge Plan: SNF    Current Diagnoses: Patient Active Problem List   Diagnosis Date Noted  . 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 Peripheral edema 08/31/2012  . Urinary frequency 08/31/2012  . Diverticulosis 03/23/2012  . Impaired fasting glucose 02/06/2012  . Decreased libido 03/07/2011  . Erectile dysfunction 03/07/2011  . Essential hypertension, benign 12/20/2010  . Mixed hyperlipidemia 12/20/2010  . ALLERGIC RHINITIS, SEASONAL 05/02/2010  . 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 HIATAL HERNIA, HX OF 05/02/2010    Orientation RESPIRATION BLADDER Height & Weight     Self, Time, Situation, Place  Normal Continent Weight: 252 lb (114.306 kg) Height:  6' (182.9 cm)  BEHAVIORAL SYMPTOMS/MOOD NEUROLOGICAL BOWEL NUTRITION STATUS      Continent    AMBULATORY STATUS COMMUNICATION OF NEEDS Skin   Extensive Assist Verbally Surgical wounds                       Personal Care Assistance Level of Assistance  Bathing, Dressing Bathing Assistance: Limited assistance   Dressing Assistance: Limited assistance     Functional Limitations Info             SPECIAL CARE FACTORS FREQUENCY  OT (By licensed OT), PT (By licensed PT)     PT Frequency: daily OT Frequency: daily            Contractures Contractures Info: Not present    Additional Factors  Info  Allergies   Allergies Info: diovan, tagamet           Current Medications (06/22/2015):  This is the current hospital active medication list Current Facility-Administered Medications  Medication Dose Route Frequency Provider Last Rate Last Dose  . 0.9 %  sodium chloride infusion   Intravenous Continuous Carlynn Spry, PA-C 75 mL/hr at 06/19/15 1647    . acetaminophen (TYLENOL) tablet 650 mg  650 mg Oral Q6H PRN Carlynn Spry, PA-C   650 mg at 06/22/15 [id number]   Or  . acetaminophen (TYLENOL) suppository 650 mg  650 mg Rectal Q6H PRN Carlynn Spry, PA-C      . alum & mag hydroxide-simeth (MAALOX/MYLANTA) 200-200-20 MG/5ML suspension 30 mL  30 mL Oral Q4H PRN Carlynn Spry, PA-C   30 mL at 06/22/15 1332  . bisacodyl (DULCOLAX) EC tablet 5 mg  5 mg Oral Daily PRN Carlynn Spry, PA-C   5 mg at 06/22/15 1114  . celecoxib (CELEBREX) capsule 200 mg  200 mg Oral Q12H Carlynn Spry, PA-C   200 mg at 06/22/15 [id number]  . diphenhydrAMINE (BENADRYL) 12.5 MG/5ML elixir 12.5-25 mg  12.5-25 mg Oral Q4H PRN Carlynn Spry, PA-C      . docusate sodium (COLACE) capsule 100 mg  100 mg Oral BID Carlynn Spry, PA-C   100 mg at 06/22/15 [id number]  . enoxaparin (LOVENOX) injection 30 mg  30 mg Subcutaneous Q12H  Carlynn Spry, PA-C   30 mg at 06/22/15 [id number]  . HYDROmorphone (DILAUDID) injection 1 mg  1 mg Intravenous Q2H PRN Carlynn Spry, PA-C   1 mg at 06/22/15 0427  . losartan (COZAAR) tablet 100 mg  100 mg Oral Daily Donia Ast, [place]   100 mg at 06/22/15 [id number]  . menthol-cetylpyridinium (CEPACOL) lozenge 3 mg  1 lozenge Oral PRN Carlynn Spry, PA-C       Or  . phenol (CHLORASEPTIC) mouth spray 1 spray  1 spray Mouth/Throat PRN Carlynn Spry, PA-C      . methocarbamol (ROBAXIN) tablet 500 mg  500 mg Oral Q6H PRN Carlynn Spry, PA-C   500 mg at 06/20/15 1752   Or  . methocarbamol (ROBAXIN) 500 mg in dextrose 5 % 50 mL IVPB  500 mg Intravenous Q6H PRN Carlynn Spry, PA-C      . metoCLOPramide (REGLAN) tablet 5-10 mg   5-10 mg Oral Q8H PRN Carlynn Spry, PA-C       Or  . metoCLOPramide (REGLAN) injection 5-10 mg  5-10 mg Intravenous Q8H PRN Carlynn Spry, PA-C      . metoprolol tartrate (LOPRESSOR) tablet 25 mg  25 mg Oral BID Donia Ast, [place]   25 mg at 06/22/15 [id number]  . ondansetron (ZOFRAN) tablet 4 mg  4 mg Oral Q6H PRN Carlynn Spry, PA-C   4 mg at 06/21/15 0535   Or  . ondansetron [facility]) injection 4 mg  4 mg Intravenous Q6H PRN Carlynn Spry, PA-C   4 mg at 06/22/15 0935  . oxyCODONE (Oxy IR/ROXICODONE) immediate release tablet 5-10 mg  5-10 mg Oral Q3H PRN Carlynn Spry, PA-C   10 mg at 06/20/15 1752  . polyethylene glycol (MIRALAX / GLYCOLAX) packet 17 g  17 g Oral Daily PRN Vickey Huger, MD   17 g at 06/22/15 1114  . senna-docusate (Senokot-S) tablet 1 tablet  1 tablet Oral QHS PRN Carlynn Spry, PA-C      . sodium phosphate (FLEET) 7-19 GM/118ML enema 1 enema  1 enema Rectal Daily PRN Vickey Huger, MD   1 enema at 06/22/15 1332  . tranexamic acid (CYKLOKAPRON) 1,000 mg in sodium chloride 0.9 % 100 mL IVPB  1,000 mg Intravenous Once Carlynn Spry, PA-C      . zolpidem (AMBIEN) tablet 5 mg  5 mg Oral QHS PRN Carlynn Spry, PA-C         Discharge Medications: Please see discharge summary for a list of discharge medications.  Relevant Imaging Results:  Relevant Lab Results:   Additional Information  SSN: [id number]   Dulcy Fanny, LCSW

## 2015-06-23 MED ORDER — ACETAMINOPHEN 325 MG PO TABS: 650.0000 mg | ORAL_TABLET | Freq: Four times a day (QID) | ORAL | Status: DC | PRN

## 2015-06-23 MED ORDER — ENOXAPARIN SODIUM 40 MG/0.4ML ~~LOC~~ SOLN
40.0000 mg | SUBCUTANEOUS | Status: DC
Start: 2015-06-23 — End: 2015-08-22

## 2015-06-23 MED ORDER — POLYETHYLENE GLYCOL 3350 17 G PO PACK: 17.0000 g | PACK | Freq: Every day | ORAL | Status: DC | PRN

## 2015-06-23 MED ORDER — CARISOPRODOL 350 MG PO TABS: 350.0000 mg | ORAL_TABLET | Freq: Four times a day (QID) | ORAL | Status: DC

## 2015-06-23 MED ORDER — DOCUSATE SODIUM 100 MG PO CAPS: 100.0000 mg | ORAL_CAPSULE | Freq: Two times a day (BID) | ORAL | Status: DC

## 2015-06-23 MED ORDER — TRAMADOL HCL 50 MG PO TABS: 50.0000 mg | ORAL_TABLET | Freq: Four times a day (QID) | ORAL | Status: DC | PRN

## 2015-06-23 NOTE — Clinical Social Work Note (Signed)
Clinical Social Worker continuing to follow patient for support and discharge planning needs.  Patient has made improvements with therapies and plans to return home with home health services per CM note.  Patient has been able to find some assistance through his neighbor and ex-wife.  CSW will sign off for now as social work intervention is no longer needed. Please consult [place] again if new need arises.  Barbette Or, [place]

## 2015-06-23 NOTE — Progress Notes (Signed)
Occupational Therapy Treatment/Discharge Patient Details Name: Gary Carroll MRN: [id number] DOB: [birth year] Today's Date: 06/23/2015    History of present illness Admitted for RTKA;  has a past medical history of Bilateral knee pain; Prediabetes; Allergic rhinitis; Hiatal hernia; Heart murmur; HLD (hyperlipidemia); HTN (hypertension); Erectile dysfunction; Arthritis; Asthma; GERD (gastroesophageal reflux disease); Diverticulosis (02/2012); Urinary frequency; Overactive bladder; Urinary urgency; Wears glasses; and Ringing of ears. has past surgical history that includesFinger surgery (Right); Total knee arthroplasty (2012)   OT comments  Pt has made excellent progress towards occupational therapy goals. Practiced tub transfer and discussed using 3in1 if pt's current small stool feels unsteady/unsafe. Pt completed all other transfers, LB ADLs with AE, and ambulated 200' at supervision level. Educated pt on energy conservation and fall prevention strategies and notified RN to allow pt to take urinal jugs home. All education has been completed and pt has no further questions. Pt with no further acute OT needs. OT signing off.   Follow Up Recommendations  Home health OT;Supervision - Intermittent    Equipment Recommendations  None recommended by OT    Recommendations for Other Services      Precautions / Restrictions Precautions Precautions: Knee;Fall Precaution Booklet Issued: No Precaution Comments: Reviewed not placing pillow or other object underneath R knee Restrictions Weight Bearing Restrictions: Yes RLE Weight Bearing: Weight bearing as tolerated       Mobility Bed Mobility               General bed mobility comments: Pt up in chair on OT arrival  Transfers Overall transfer level: Needs assistance Equipment used: Rolling walker (2 wheeled) Transfers: Sit to/from Stand Sit to Stand: Supervision         General transfer comment: Supervision for safety. Cues  for hand placement x4 - pt with good return demonstration towards end of session.     Balance Overall balance assessment: Needs assistance Sitting-balance support: No upper extremity supported;Feet supported Sitting balance-Leahy Scale: Good     Standing balance support: Bilateral upper extremity supported;During functional activity Standing balance-Leahy Scale: Fair Standing balance comment: Can maintain static standing balance without UE - requires UE support for dynamic standing                    ADL Overall ADL's : Needs assistance/impaired                 Upper Body Dressing : Supervision/safety;Sitting   Lower Body Dressing: Supervision/safety;With adaptive equipment;Cueing for compensatory techniques;Sit to/from stand Lower Body Dressing Details (indicate cue type and reason): Cues to dress R leg first and to use reacher to don/doff pants and underwear Toilet Transfer: Supervision/safety;BSC;Ambulation;RW   Toileting- Clothing Manipulation and Hygiene: Supervision/safety;Sit to/from stand   Tub/ Shower Transfer: Tub transfer;Min guard;Cueing for sequencing;Ambulation;Shower Technical sales engineer Details (indicate cue type and reason): Cues for step sequence with RW and hand placement on DME Functional mobility during ADLs: Min guard;Rolling walker General ADL Comments: Practiced tub transfer and discussed possibly using 3in1 if pt's current use of a short stool feels unsteady/unsafe. Discussed working with [facility] therapist to determine safest option. Discussed using reach for LB ADLs as needed.      Vision                     Perception     Praxis      Cognition   Behavior During Therapy: [facility] for tasks assessed/performed Overall Cognitive Status: Within Functional Limits for tasks assessed  Extremity/Trunk Assessment               Exercises Total Joint Exercises Quad Sets: AROM;Right;10  reps;Seated Heel Slides: AROM;Right;10 reps;Seated Straight Leg Raises: AROM;Right;10 reps;Seated Long Arc Quad: AROM;Right;10 reps;Seated Knee Flexion: AROM;Right;10 reps;Seated Goniometric ROM: 0-95   Shoulder Instructions       General Comments      Pertinent Vitals/ Pain       Pain Assessment: 0-10 Pain Score: 5  Pain Location: R knee and indigestion Pain Descriptors / Indicators: Aching Pain Intervention(s): Limited activity within patient's tolerance;Monitored during session;Repositioned  Home Living                                          Prior Functioning/Environment              Frequency       Progress Toward Goals  OT Goals(current goals can now be found in the care plan section)  Progress towards OT goals: Goals met/education completed, patient discharged from OT  Acute Rehab OT Goals Patient Stated Goal: to be able to go home independently OT Goal Formulation: With patient Time For Goal Achievement: 07/04/15 Potential to Achieve Goals: Good ADL Goals Pt Will Perform Grooming: with modified independence;standing Pt Will Perform Lower Body Bathing: with modified independence;with adaptive equipment;sitting/lateral leans;sit to/from stand Pt Will Perform Lower Body Dressing: with modified independence;sitting/lateral leans;with adaptive equipment;sit to/from stand Pt Will Transfer to Toilet: with modified independence;ambulating;bedside commode Pt Will Perform Toileting - Clothing Manipulation and hygiene: with modified independence;sit to/from stand;sitting/lateral leans Pt Will Perform Tub/Shower Transfer: Tub transfer;with modified independence;ambulating;shower seat;rolling walker Additional ADL Goal #1: Pt will independently use urinal jug before each transfer to decrease risk of incontinence and decrease fall risk during ambulation.  Plan All goals met and education completed, patient discharged from OT services     Co-evaluation                 End of Session Equipment Utilized During Treatment: Gait belt;Rolling walker   Activity Tolerance Patient tolerated treatment well   Patient Left in chair;with call bell/phone within reach;with chair alarm set   Nurse Communication Mobility status;Other (comment) (Allow pt to take home urinal jugs)        Time: 7341-9379 OT Time Calculation (min): 44 min  Charges: OT General Charges $OT Visit: 1 Procedure OT Treatments $Self Care/Home Management : 38-52 mins  Redmond Baseman, OTR/L Pager: [phone] 06/23/2015, 2:41 PM

## 2015-06-23 NOTE — Progress Notes (Signed)
Physical Therapy Treatment Patient Details Name: Gary Carroll MRN: [id number] DOB: 1945-11-08 Today's Date: 06/23/2015    History of Present Illness Admitted for RTKA;  has a past medical history of Bilateral knee pain; Prediabetes; Allergic rhinitis; Hiatal hernia; Heart murmur; HLD (hyperlipidemia); HTN (hypertension); Erectile dysfunction; Arthritis; Asthma; GERD (gastroesophageal reflux disease); Diverticulosis (02/2012); Urinary frequency; Overactive bladder; Urinary urgency; Wears glasses; and Ringing of ears. has past surgical history that includesFinger surgery (Right); Total knee arthroplasty (2012)    PT Comments    Patient has progressed well toward mobility goals and demonstrated carry over of safe transfers and use of RW. Pt will still need supervision intermittently and spoke with pt about need for assistance especially around meal time due to pt requiring RW for balance.  Reviewed use of bone foam, CPM, and HEP. Pt will benefit from further skilled PT services to increase independence and safety with mobility.   Follow Up Recommendations  Home health PT;Supervision - Intermittent     Equipment Recommendations       Recommendations for Other Services OT consult     Precautions / Restrictions Precautions Precautions: Knee Precaution Booklet Issued: Yes (comment) Precaution Comments: Reviewed not placing pillow or other object underneath R knee Restrictions Weight Bearing Restrictions: Yes RLE Weight Bearing: Weight bearing as tolerated    Mobility  Bed Mobility               General bed mobility comments: OOB in chair upon arrival  Transfers Overall transfer level: Needs assistance Equipment used: Rolling walker (2 wheeled) Transfers: Sit to/from Stand Sit to Stand: Supervision         General transfer comment: supervision for safety; good hand placement and technique  Ambulation/Gait Ambulation/Gait assistance: Min guard Ambulation Distance  (Feet): 150 Feet Assistive device: Rolling walker (2 wheeled) Gait Pattern/deviations: Step-through pattern;Decreased stride length;Trunk flexed   Gait velocity interpretation: Below normal speed for age/gender General Gait Details: vc for posture and safe use of RW; pt with some carry over demonstrated throughout session   Stairs Stairs: Yes Stairs assistance: Min guard Stair Management: One rail Right;Sideways Number of Stairs: 5 General stair comments: pt with carry over of sequencing and technique; no unsteadiness or knee buckling  Wheelchair Mobility    Modified Rankin (Stroke Patients Only)       Balance Overall balance assessment: Needs assistance Sitting-balance support: No upper extremity supported Sitting balance-Leahy Scale: Good     Standing balance support: Bilateral upper extremity supported Standing balance-Leahy Scale: Fair (use of RW) Standing balance comment: Can maintain static standing balance without UE - requires UE support for dynamic standing                     Cognition Arousal/Alertness: Awake/alert Behavior During Therapy: WFL for tasks assessed/performed Overall Cognitive Status: Within Functional Limits for tasks assessed                      Exercises Total Joint Exercises Quad Sets: AROM;Right;10 reps;Seated Heel Slides: AROM;Right;10 reps;Seated Straight Leg Raises: AROM;Right;10 reps;Seated Long Arc Quad: AROM;Right;10 reps;Seated Knee Flexion: AROM;Right;10 reps;Standing Goniometric ROM: 0-95 Marching in Standing: AROM;Right;10 reps;Standing    General Comments        Pertinent Vitals/Pain Pain Assessment: Faces Pain Score: 5  Faces Pain Scale: Hurts little more Pain Location: R knee Pain Descriptors / Indicators: Sore;Grimacing Pain Intervention(s): Limited activity within patient's tolerance;Monitored during session;Premedicated before session;Repositioned    Home Living  Prior Function            PT Goals (current goals can now be found in the care plan section) Acute Rehab PT Goals Patient Stated Goal: to be able to go home independently Progress towards PT goals: Progressing toward goals    Frequency  7X/week    PT Plan Current plan remains appropriate    Co-evaluation             End of Session Equipment Utilized During Treatment: Gait belt Activity Tolerance: Patient tolerated treatment well Patient left: with call bell/phone within reach;in bed     Time: [id number] PT Time Calculation (min) (ACUTE ONLY): 18 min  Charges:  $Gait Training: 8-22 mins $Therapeutic Exercise: 8-22 mins                    G Codes:      Salina April, PTA Pager: [phone]   06/23/2015, 3:20 PM

## 2015-06-23 NOTE — Care Management Note (Signed)
Case Management Note  Patient Details  Name: Gary Carroll MRN: [id number] Date of Birth: December 19, 1945  Subjective/Objective:    70 yr old male s/p right total knee arthroplasty.    Action/Plan: Case manager spoke with patient on 06/21/15 concerning home health and DME needs. Patient was preoperatively setup with [facility], no changes. He had a decline in progress and was screened for SNF placement but has now improved and will go home. Patient states he has a neighbor that will assist and him and his ex-wife will as well.    Expected Discharge Date:    06/23/15              Expected Discharge Plan:  [place]  In-House Referral:  NA  Discharge planning Services  CM Consult  Post Acute Care Choice:  Home Health Choice offered to:  Patient  DME Arranged:    DME Agency:     HH Arranged:  PT, OT, Nurse's Aide [place] Agency:  [place]  Status of Service:  Completed, signed off  Medicare Important Message Given:    Date Medicare IM Given:    Medicare IM give by:    Date Additional Medicare IM Given:    Additional Medicare Important Message give by:     If discussed at [place] of Stay Meetings, dates discussed:    Additional Comments:  Ninfa Meeker, RN 06/23/2015, 11:27 AM

## 2015-06-23 NOTE — Progress Notes (Signed)
Physical Therapy Treatment Patient Details Name: Gary Carroll MRN: [id number] DOB: 1946/02/03 Today's Date: 06/23/2015    History of Present Illness Admitted for RTKA;  has a past medical history of Bilateral knee pain; Prediabetes; Allergic rhinitis; Hiatal hernia; Heart murmur; HLD (hyperlipidemia); HTN (hypertension); Erectile dysfunction; Arthritis; Asthma; GERD (gastroesophageal reflux disease); Diverticulosis (02/2012); Urinary frequency; Overactive bladder; Urinary urgency; Wears glasses; and Ringing of ears. has past surgical history that includesFinger surgery (Right); Total knee arthroplasty (2012)    PT Comments    Patient with improved mobility this session. Pt continues to need most assistance with ambulating safely with use of AD. Pt received stair training and reviewed HEP. Continue to progress as tolerated.   Follow Up Recommendations  SNF     Equipment Recommendations       Recommendations for Other Services OT consult     Precautions / Restrictions Precautions Precautions: Knee Precaution Booklet Issued: Yes (comment) Restrictions RLE Weight Bearing: Weight bearing as tolerated    Mobility  Bed Mobility               General bed mobility comments: OOB in chair upon arrival  Transfers Overall transfer level: Needs assistance Equipment used: Rolling walker (2 wheeled) Transfers: Sit to/from Stand Sit to Stand: Min guard         General transfer comment: carry over of hand placement and technique; min guard for safety  Ambulation/Gait Ambulation/Gait assistance: Min guard Ambulation Distance (Feet): 200 Feet Assistive device: Rolling walker (2 wheeled) Gait Pattern/deviations: Step-through pattern;Decreased stride length;Trunk flexed   Gait velocity interpretation: Below normal speed for age/gender General Gait Details: pt with improved symmetrical bilat step lengths; vc for posture, safe position of RW, and increaed bilat step  height   Stairs Stairs: Yes Stairs assistance: Min guard Stair Management: One rail Right;Sideways Number of Stairs: 5 General stair comments: pt with carry over of sequencing and technique; no unsteadiness or knee buckling  Wheelchair Mobility    Modified Rankin (Stroke Patients Only)       Balance Overall balance assessment: Needs assistance Sitting-balance support: Feet supported Sitting balance-Leahy Scale: Good     Standing balance support: Bilateral upper extremity supported Standing balance-Leahy Scale: Fair (use of RW)                      Cognition Arousal/Alertness: Awake/alert Behavior During Therapy: WFL for tasks assessed/performed Overall Cognitive Status: Within Functional Limits for tasks assessed                      Exercises Total Joint Exercises Quad Sets: AROM;Right;10 reps;Seated Heel Slides: AROM;Right;10 reps;Seated Straight Leg Raises: AROM;Right;10 reps;Seated Long Arc Quad: AROM;Right;10 reps;Seated Knee Flexion: AROM;Right;10 reps;Seated Goniometric ROM: 0-95    General Comments        Pertinent Vitals/Pain Pain Assessment: 0-10 Pain Score: 6  Pain Location: R knee Pain Descriptors / Indicators: Sore Pain Intervention(s): Limited activity within patient's tolerance;Monitored during session;Premedicated before session;Repositioned    Home Living                      Prior Function            PT Goals (current goals can now be found in the care plan section) Acute Rehab PT Goals Patient Stated Goal: to be able to go home independently Progress towards PT goals: Progressing toward goals    Frequency  7X/week    PT Plan Current plan remains  appropriate    Co-evaluation             End of Session Equipment Utilized During Treatment: Gait belt Activity Tolerance: Patient tolerated treatment well Patient left: with call bell/phone within reach;in bed     Time: 1004-1037 PT Time  Calculation (min) (ACUTE ONLY): 33 min  Charges:  $Gait Training: 8-22 mins $Therapeutic Exercise: 8-22 mins                    G Codes:      Salina April, PTA Pager: [phone]   06/23/2015, 12:21 PM

## 2015-06-23 NOTE — Progress Notes (Signed)
SPORTS MEDICINE AND JOINT REPLACEMENT  [name], MD   Carlynn Spry, PA-C [place], [place], [place]  [address]                             [phone]   PROGRESS NOTE  Subjective:  negative for Chest Pain  negative for Shortness of Breath  negative for Nausea/Vomiting   negative for Calf Pain  positive for Bowel Movement   Tolerating Diet: yes         Patient reports pain as 3 on 0-10 scale.    Objective: Vital signs in last 24 hours:   Patient Vitals for the past 24 hrs:  BP Temp Temp src Pulse Resp SpO2  06/23/15 0500 130/62 mmHg 97.7 F (36.5 C) Oral 78 18 97 %  06/22/15 2138 (!) 109/58 mmHg 98.2 F (36.8 C) Oral 76 16 95 %  06/22/15 1336 124/66 mmHg 98.2 F (36.8 C) Oral 78 17 96 %    @flow {1959:LAST@   Intake/Output from previous day:   02/02 0701 - 02/03 0700 In: 480 [P.O.:480] Out: 3500 [Urine:3500]   Intake/Output this shift:       Intake/Output      02/02 0701 - 02/03 0700 02/03 0701 - 02/04 0700   P.O. 480    Total Intake(mL/kg) 480 (4.2)    Urine (mL/kg/hr) 3500 (1.3)    Total Output 3500     Net -3020             LABORATORY DATA:  Recent Labs  06/19/15 1426 06/20/15 0746 06/21/15 0639 06/22/15 0606  WBC 4.8 11.4* 15.4* 15.2*  HGB 13.9 14.5 13.7 12.9*  HCT 41.3 42.8 40.3 38.5*  PLT 189 191 171 184    Recent Labs  06/19/15 1426 06/20/15 0746  NA  --  137  K  --  4.1  CL  --  105  CO2  --  22  BUN  --  21*  CREATININE 0.81 1.36*  GLUCOSE  --  142*  CALCIUM  --  8.7*   Lab Results  Component Value Date   INR 1.15 06/08/2015   INR 1.04 05/31/2010    Examination:  General appearance: alert, cooperative and no distress Extremities: Homans sign is negative, no sign of DVT  Wound Exam: clean, dry, intact   Drainage:  None: wound tissue dry  Motor Exam: Quadriceps and Hamstrings Intact  Sensory Exam: Deep Peroneal normal   Assessment:    4 Days Post-Op  Procedure(s) (LRB): TOTAL KNEE ARTHROPLASTY  (Right)  ADDITIONAL DIAGNOSIS:  Active Problems:   S/P total knee arthroplasty  Acute Blood Loss Anemia   Plan: Physical Therapy as ordered Weight Bearing as Tolerated (WBAT)  DVT Prophylaxis:  Lovenox  DISCHARGE PLAN: Home patient doing much better today with advancements with physical therapy  DISCHARGE NEEDS: HHPT, CPM, Walker and 3-in-1 comode seat         Edelmiro Innocent 06/23/2015, 11:59 AM

## 2015-06-26 ENCOUNTER — Ambulatory Visit (HOSPITAL_COMMUNITY): Payer: Medicare Other | Admitting: Physical Therapy

## 2015-06-27 NOTE — Discharge Summary (Signed)
SPORTS MEDICINE & JOINT REPLACEMENT   Gary Mulch, MD   Gary Spry, PA-C Gary Shadow, PA-C [place], [place], [place]  [address]                             [phone]  PATIENT ID: Gary Carroll        MRN:  [id number]          DOB/AGE: 06/10/1945 / 70 y.o.    DISCHARGE SUMMARY  ADMISSION DATE:    06/19/2015 DISCHARGE DATE:  06/23/2015  ADMISSION DIAGNOSIS: PRIMARY OSTEOARTHRITIS RIGHT KNEE    DISCHARGE DIAGNOSIS:  PRIMARY OSTEOARTHRITIS RIGHT KNEE    ADDITIONAL DIAGNOSIS: Active Problems:   S/P total knee arthroplasty  Past Medical History  Diagnosis Date  . Bilateral knee pain     OA  . Prediabetes   . Allergic rhinitis   . Hiatal hernia   . Heart murmur   . HLD (hyperlipidemia)   . HTN (hypertension)   . Erectile dysfunction   . Arthritis   . Asthma     seasonal per pt  . GERD (gastroesophageal reflux disease)   . Diverticulosis 02/2012    seen on colonoscopy  . Urinary frequency   . Overactive bladder   . Urinary urgency   . Wears glasses   . Ringing of ears     PROCEDURE: Procedure(s): TOTAL KNEE ARTHROPLASTY on 06/19/2015  CONSULTS:     HISTORY:  See H&P in chart  HOSPITAL COURSE:  Gary Carroll is a 70 y.o. admitted on 06/19/2015 and found to have a diagnosis of [name].  After appropriate laboratory studies were obtained  they were taken to the operating room on 06/19/2015 and underwent Procedure(s): TOTAL KNEE ARTHROPLASTY.   They were given perioperative antibiotics:  Anti-infectives    Start     Dose/Rate Route Frequency Ordered Stop   06/19/15 1430  ceFAZolin (ANCEF) IVPB 2 g/50 mL premix     2 g 100 mL/hr over 30 Minutes Intravenous Every 6 hours 06/19/15 1313 06/20/15 0229   06/19/15 0830  ceFAZolin (ANCEF) IVPB 2 g/50 mL premix     2 g 100 mL/hr over 30 Minutes Intravenous To ShortStay Surgical 06/16/15 0746 06/19/15 0835    .  Patient given tranexamic acid IV or topical and exparel  intra-operatively.  Tolerated the procedure well.    POD# 1: Vital signs were stable.  Patient denied Chest pain, shortness of breath, or calf pain.  Patient was started on Lovenox 30 mg subcutaneously twice daily at 8am.  Consults to PT, OT, and care management were made.  The patient was weight bearing as tolerated.  CPM was placed on the operative leg 0-90 degrees for 6-8 hours a day. When out of the CPM, patient was placed in the foam block to achieve full extension. Incentive spirometry was taught.  Dressing was changed.       POD #2, Continued  PT for ambulation and exercise program.  IV saline locked.  O2 discontinued.   POD#3 Pt abd distended, incontinent and constipated. Given enema and foley placed which resolved issue.  Oxycodone stopped.  The remainder of the hospital course was dedicated to ambulation and strengthening.   The patient was discharged on 4 days post op in  Good condition.  Blood products given:none  DIAGNOSTIC STUDIES: Recent vital signs: No data found.      Recent laboratory studies:  Recent Labs  06/20/15 0746 06/21/15 0639 06/22/15 0606  WBC 11.4* 15.4* 15.2*  HGB 14.5 13.7 12.9*  HCT 42.8 40.3 38.5*  PLT 191 171 184    Recent Labs  06/20/15 0746  NA 137  K 4.1  CL 105  CO2 22  BUN 21*  CREATININE 1.36*  GLUCOSE 142*  CALCIUM 8.7*   Lab Results  Component Value Date   INR 1.15 06/08/2015   INR 1.04 05/31/2010     Recent Radiographic Studies :  Dg Chest 2 View  06/08/2015  CLINICAL DATA:  70 year old male with congestion and sinus drainage. Preoperative respiratory examination for knee surgery. EXAM: CHEST  2 VIEW COMPARISON:  05/31/2010 FINDINGS: Upper limits normal heart size noted. Mild peribronchial thickening is unchanged. There is no evidence of focal airspace disease, pulmonary edema, suspicious pulmonary nodule/mass, pleural effusion, or pneumothorax. No acute bony abnormalities are identified. IMPRESSION: No evidence of acute  cardiopulmonary disease. Chronic peribronchial thickening. Electronically Signed   By: Margarette Canada M.D.   On: 06/08/2015 14:21    DISCHARGE INSTRUCTIONS: Discharge Instructions    CPM    Complete by:  As directed   Continuous passive motion machine (CPM):      Use the CPM from 0 to 90 for 6-8 hours per day.      You may increase by 10 per day.  You may break it up into 2 or 3 sessions per day.      Use CPM for 2 weeks or until you are told to stop.     Call MD / Call 911    Complete by:  As directed   If you experience chest pain or shortness of breath, CALL 911 and be transported to the hospital emergency room.  If you develope a fever above 101 F, pus (white drainage) or increased drainage or redness at the wound, or calf pain, call your surgeon's office.     Change dressing    Complete by:  As directed   Change dressing on [place], then change the dressing daily with sterile 4 x 4 inch gauze dressing and apply TED hose.     Constipation Prevention    Complete by:  As directed   Drink plenty of fluids.  Prune juice may be helpful.  You may use a stool softener, such as Colace (over the counter) 100 mg twice a day.  Use MiraLax (over the counter) for constipation as needed.     Diet - low sodium heart healthy    Complete by:  As directed      Do not put a pillow under the knee. Place it under the heel.    Complete by:  As directed      Driving restrictions    Complete by:  As directed   No driving for 6 weeks     Increase activity slowly as tolerated    Complete by:  As directed      Lifting restrictions    Complete by:  As directed   No lifting for 6 weeks     TED hose    Complete by:  As directed   Use stockings (TED hose) for 2 weeks on both leg(s).  You may remove them at night for sleeping.           DISCHARGE MEDICATIONS:     Medication List    STOP taking these medications        aspirin 81 MG tablet     ibuprofen 200 MG  tablet  Commonly known as:   ADVIL,MOTRIN      TAKE these medications        acetaminophen 325 MG tablet  Commonly known as:  TYLENOL  Take 2 tablets (650 mg total) by mouth every 6 (six) hours as needed for mild pain (or Fever >/= 101).     antiseptic oral rinse Liqd  15 mLs by Mouth Rinse route as needed for dry mouth.     carisoprodol 350 MG tablet  Commonly known as:  SOMA  Take 1 tablet (350 mg total) by mouth 4 (four) times daily.     docusate sodium 100 MG capsule  Commonly known as:  COLACE  Take 1 capsule (100 mg total) by mouth 2 (two) times daily.     enoxaparin 40 MG/0.4ML injection  Commonly known as:  LOVENOX  Inject 0.4 mLs (40 mg total) into the skin daily.     Fish Oil 1200 MG Caps  Take 2 capsules by mouth 2 (two) times daily.     losartan 100 MG tablet  Commonly known as:  COZAAR  Take 1 tablet (100 mg total) by mouth daily.     metoCLOPramide 5 MG tablet  Commonly known as:  REGLAN  Take 1-2 tablets (5-10 mg total) by mouth every 8 (eight) hours as needed for nausea (if ondansetron (ZOFRAN) ineffective.).     metoprolol tartrate 25 MG tablet  Commonly known as:  LOPRESSOR  Take 1 tablet (25 mg total) by mouth 2 (two) times daily.     MULTIVITAL tablet  Take 1 tablet by mouth daily.     oxyCODONE 10 mg 12 hr tablet  Commonly known as:  OXYCONTIN  Take 1 tablet (10 mg total) by mouth every 12 (twelve) hours.     polyethylene glycol packet  Commonly known as:  MIRALAX / GLYCOLAX  Take 17 g by mouth daily as needed for moderate constipation.     pravastatin 40 MG tablet  Commonly known as:  PRAVACHOL  Take 1 tablet (40 mg total) by mouth daily.     sodium chloride 0.65 % Soln nasal spray  Commonly known as:  OCEAN  Place 1 spray into both nostrils as needed for congestion.     sodium phosphate 7-19 GM/118ML Enem  Place 133 mLs (1 enema total) rectally daily as needed for severe constipation.     traMADol 50 MG tablet  Commonly known as:  ULTRAM  Take 1-2 tablets  (50-100 mg total) by mouth every 6 (six) hours as needed.        FOLLOW UP VISIT:       Follow-up Information    Follow up with Rudean Haskell, MD. Call on 07/04/2015.   Specialty:  Orthopedic Surgery   Contact information:   [address] [place] [address] [phone]       Follow up with [facility].   Why:  Someone from [facility] will contact you concerning start date and time for therapy.   Contact information:   [address] [place] [address] [phone]       DISPOSITION: HOME   CONDITION:  Good   Kya Mayfield 06/27/2015, 7:18 AM

## 2015-07-05 ENCOUNTER — Ambulatory Visit (HOSPITAL_COMMUNITY): Payer: Commercial Managed Care - HMO | Admitting: Physical Therapy

## 2015-07-10 ENCOUNTER — Encounter (HOSPITAL_COMMUNITY): Payer: Self-pay | Admitting: Physical Therapy

## 2015-07-10 ENCOUNTER — Ambulatory Visit (HOSPITAL_COMMUNITY): Payer: No Typology Code available for payment source | Attending: Orthopedic Surgery | Admitting: Physical Therapy

## 2015-07-10 DIAGNOSIS — M25661 Stiffness of right knee, not elsewhere classified: Secondary | ICD-10-CM | POA: Diagnosis present

## 2015-07-10 DIAGNOSIS — R262 Difficulty in walking, not elsewhere classified: Secondary | ICD-10-CM | POA: Diagnosis present

## 2015-07-10 DIAGNOSIS — M6281 Muscle weakness (generalized): Secondary | ICD-10-CM | POA: Diagnosis present

## 2015-07-10 DIAGNOSIS — Z96651 Presence of right artificial knee joint: Secondary | ICD-10-CM

## 2015-07-10 DIAGNOSIS — R600 Localized edema: Secondary | ICD-10-CM | POA: Diagnosis present

## 2015-07-10 DIAGNOSIS — Z7409 Other reduced mobility: Secondary | ICD-10-CM

## 2015-07-10 NOTE — Therapy (Signed)
[place], [place], [address] Phone: [phone]   Fax:  [phone]  Physical Therapy Evaluation  Patient Details  Name: Gary Carroll MRN: [id number] Date of Birth: April 19, 1946 Referring Provider: Dr. Augustin Coupe   Encounter Date: 07/10/2015      PT End of Session - 07/10/15 1444    Visit Number 1   Number of Visits 18   Date for PT Re-Evaluation 08/07/15   Authorization Type [employer] Medicare primary, [place] insurance secondary    Authorization Time Period 07/10/15 to 09/07/15   Authorization - Visit Number 1   Authorization - Number of Visits 10   PT Start Time 1148   PT Stop Time 1226   PT Time Calculation (min) 38 min   Activity Tolerance Patient tolerated treatment well   Behavior During Therapy [facility] for tasks assessed/performed      Past Medical History  Diagnosis Date  . Bilateral knee pain     OA  . Prediabetes   . Allergic rhinitis   . Hiatal hernia   . Heart murmur   . HLD (hyperlipidemia)   . HTN (hypertension)   . Erectile dysfunction   . Arthritis   . Asthma     seasonal per pt  . GERD (gastroesophageal reflux disease)   . Diverticulosis 02/2012    seen on colonoscopy  . Urinary frequency   . Overactive bladder   . Urinary urgency   . Wears glasses   . Ringing of ears     Past Surgical History  Procedure Laterality Date  . Tonsillectomy    . Finger surgery Right   . Total knee arthroplasty  2012    left (Dr. Lorre Nick)  . Cholecystectomy    . Colonoscopy  02/2012    diverticulosis; Dr. Deatra Ina  . Total knee arthroplasty Right 06/19/2015    Procedure: TOTAL KNEE ARTHROPLASTY;  Surgeon: Vickey Huger, MD;  Location: [facility];  Service: Orthopedics;  Laterality: Right;    There were no vitals filed for this visit.  Visit Diagnosis:  History of total right knee replacement - Plan: PT plan of care cert/re-cert  Localized edema - Plan: PT plan of care cert/re-cert  Knee stiffness, right  - Plan: PT plan of care cert/re-cert  Muscle weakness - Plan: PT plan of care cert/re-cert  Difficulty walking - Plan: PT plan of care cert/re-cert  Impaired functional mobility and activity tolerance - Plan: PT plan of care cert/re-cert      Subjective Assessment - 07/10/15 1150    Subjective Patient had his R knee replaced on January 30th, 2017 with Dr. Lorre Nick; he did recieve some therapy while he was in the hospital recovering. He did have HHPT, which has been discharged, and he has been working on HEP from that therapist. He reports he does not really use his cane at home, usually just carries it to have it with him; was able to go to grocery store this weekend. Reports he does not have much bladder control., which he reports first started around the time of his surgery. Has stairs at home. Did have a fall before surgery but none since.    Pertinent History prediabetic, hx of hernia, GERD, poor bladder control   How long can you sit comfortably? has a lift chair, no limits    How long can you stand comfortably? 10 minutes    How long can you walk comfortably? 100 yards, approximately    Patient Stated Goals  get back to swimming, walk up and down stairs, driving    Currently in Pain? Yes   Pain Score 3    Pain Location Knee   Pain Orientation Right   Pain Descriptors / Indicators Discomfort  knee is not problem, its his back    Pain Type Surgical pain   Pain Radiating Towards no radiation    Pain Onset 1 to 4 weeks ago   Pain Frequency Intermittent   Aggravating Factors  pushing ROM    Pain Relieving Factors rest   Effect of Pain on Daily Activities cannot exercise or drive    Multiple Pain Sites Yes   Pain Score 3   Pain Location Back   Pain Orientation Lower   Pain Descriptors / Indicators Dull   Pain Type Chronic pain   Pain Radiating Towards no radiaion    Pain Onset More than a month ago   Pain Frequency Constant   Aggravating Factors  patient not really sure    Pain  Relieving Factors sitting and resting    Effect of Pain on Daily Activities no limits on activity             [facility] PT Assessment - 07/10/15 0001    Assessment   Medical Diagnosis R total knee replacement    Referring Provider Dr. Augustin Coupe    Onset Date/Surgical Date 06/19/15   Next MD Visit middle of March with Dr. Lorre Nick    Precautions   Precautions None   Restrictions   Weight Bearing Restrictions No   Balance Screen   Has the patient fallen in the past 6 months Yes   How many times? one fall before surgery    Has the patient had a decrease in activity level because of a fear of falling?  No   Is the patient reluctant to leave their home because of a fear of falling?  No   Prior Function   Level of Independence Independent;Independent with basic ADLs;Independent with gait;Independent with transfers   Vocation Retired   Leisure swimming   Observation/Other Assessments   Observations min assist sit to supine    Focus on Therapeutic Outcomes (FOTO)  53% limited    AROM   Right Knee Extension 5   Right Knee Flexion 106   Strength   Right Hip Flexion 3/5   Left Hip Flexion 3+/5   Right Knee Flexion 4/5   Right Knee Extension 3+/5  pain limited    Left Knee Flexion 4/5   Left Knee Extension 4/5   Right Ankle Dorsiflexion 3/5   Left Ankle Dorsiflexion 4+/5   Ambulation/Gait   Gait Comments reduced gait speed, reduced knee ROM, proximal muscle weakness, felxed at hips    6 minute walk test results    Aerobic Endurance Distance Walked 241   Endurance additional comments 3MWT, cane    High Level Balance   High Level Balance Comments TUG 22.76 seconds with cane                    [facility] Adult PT Treatment/Exercise - 07/10/15 0001    Knee/Hip Exercises: Stretches   Active Hamstring Stretch 2 reps;30 seconds   Active Hamstring Stretch Limitations 12 inch box    Gastroc Stretch 3 reps;30 seconds   Gastroc Stretch Limitations slantboard                  PT Education - 07/10/15 1443    Education provided Yes   Education  Details prognosis, plan of care, strongly encouraged to keep up with HHPT HEP, proper icing procedures (3-5x/day, 20 min, towel barrier between skin and ice)   Person(s) Educated Patient   Methods Explanation   Comprehension Verbalized understanding;Need further instruction          PT Short Term Goals - 07/10/15 1455    PT SHORT TERM GOAL #1   Title Patient will demonstrate R knee ROM of 0-120 degrees in order to reduce pain, enhance mobility, and improve overall level of function    Time 4   Period Weeks   Status New   PT SHORT TERM GOAL #2   Title Paitent will be independent in consistently and correctly performing at least 3 edema management techniques, including but not limited to ice, retrograde massage, and stretching/exercises, in order to enhance independence in managing condition    Time 4   Period Weeks   Status New   PT SHORT TERM GOAL #3   Title Patient will demonstrate improved gait mechanics, including even step lengths and stance times, improved posture, improved gait speed, and minimal unsteadiness in order to enhance overall mobility and assist in returning to community    Time 4   Period Weeks   Status New   PT SHORT TERM GOAL #4   Title Patient to be independent in correctly and consistently perofrming appropriate HEP, to be updated PRN    Time 4   Period Weeks   Status New           PT Long Term Goals - 07/10/15 1509    PT LONG TERM GOAL #1   Title Patient to demonstrate strength at least 4+/5 in all tested muscle groups in order to reduce pain, enhance mobility, and assist in return to community based tasks    Time 8   Period Weeks   Status New   PT LONG TERM GOAL #2   Title Patient to report that he has been able to return to at least 20 minutes of swimming, at least 3 times per week, with pain R knee no more than 1/10 and minimal fatigue, after being cleared by MD to do so, in  order to assist in improved functional actiivty tolerance and returning to PLOF    Time 8   Period Weeks   Status New   PT LONG TERM GOAL #3   Title Patient will be able to perform TUG in 9 seconds or less with no assistive device and will be able to ambulate at least 1292ft during 6MWT with no device or rest breaks in order to reduce fall risk and return to community based activities    Time 8   Period Weeks   Status New   PT LONG TERM GOAL #4   Title Patient to be able to reciprocally ascend and descend full flight of stiars with one railing and minimal unsteadiness, good eccentric control, in order to improve safe mobilty at home and in community    Time 8   Period Weeks   Status New               Plan - 07/10/15 1445    Clinical Impression Statement Patient arrives status post R total knee replacement; he states that he has not really been using ice on his knee and has noticed that he has had much more urinary frequency since the time of his surgery- encouraged to speak to his MD about this aspect. Incision appears well healing with  no areas of inflammation or infection, however there is qutie a bit of edema in the knee. Upon examination patient revals extensive muscle weakness, reduced R knee ROM, reduced gait speed and impaired mechanics, postural limitation, difficulty with functional mobility, and reduced functional activity tolerance. Patient reports he has not been icing and has not been following up with HEP from [place]; educated patient regarding parameters for and strongly encouraged patient to follow up with both activities. At this piont recommend skilled PT services, 3x/week for 8 weeks, in order to address functional limitations and assist in reaching optimal level of function.    Pt will benefit from skilled therapeutic intervention in order to improve on the following deficits Abnormal gait;Hypomobility;Increased edema;Decreased activity tolerance;Decreased  strength;Pain;Decreased balance;Decreased mobility;Difficulty walking;Improper body mechanics;Decreased coordination;Postural dysfunction   Rehab Potential Good   Clinical Impairments Affecting Rehab Potential GERD, high urinary frequency, prediabetic    PT Frequency 3x / week   PT Duration 8 weeks   PT Treatment/Interventions ADLs/Self Care Home Management;Cryotherapy;Electrical Stimulation;DME Instruction;Gait training;Stair training;Functional mobility training;Therapeutic activities;Therapeutic exercise;Balance training;Neuromuscular re-education;Patient/family education;Manual techniques;Scar mobilization;Energy conservation;Taping   PT Next Visit Plan review goals and initial eval; functional stretching and strengthening, manual PRN, gait and balance training    PT Home Exercise Plan HHPT HEP for now    Consulted and Agree with Plan of Care Patient          G-Codes - Aug 04, 2015 1528    Functional Assessment Tool Used Per FOTO 53% limited    Functional Limitation Mobility: Walking and moving around   Mobility: Walking and Moving Around Current Status [phone]) At least 40 percent but less than 60 percent impaired, limited or restricted   Mobility: Walking and Moving Around Goal Status [phone]) At least 20 percent but less than 40 percent impaired, limited or restricted       Problem List Patient Active Problem List   Diagnosis Date Noted  . 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 Peripheral edema 08/31/2012  . Urinary frequency 08/31/2012  . Diverticulosis 03/23/2012  . Impaired fasting glucose 02/06/2012  . Decreased libido 03/07/2011  . Erectile dysfunction 03/07/2011  . Essential hypertension, benign 12/20/2010  . Mixed hyperlipidemia 12/20/2010  . ALLERGIC RHINITIS, SEASONAL 05/02/2010  . 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 HIATAL HERNIA, HX OF 05/02/2010    Deniece Ree PT, DPT [facility] [address] [place],  [place], [address] Phone: [phone]   Fax:  [phone]  Name: Gary Carroll MRN: [id number] Date of Birth: [birth year]

## 2015-07-12 ENCOUNTER — Ambulatory Visit (HOSPITAL_COMMUNITY): Payer: No Typology Code available for payment source

## 2015-07-12 DIAGNOSIS — M25661 Stiffness of right knee, not elsewhere classified: Secondary | ICD-10-CM

## 2015-07-12 DIAGNOSIS — R600 Localized edema: Secondary | ICD-10-CM

## 2015-07-12 DIAGNOSIS — R262 Difficulty in walking, not elsewhere classified: Secondary | ICD-10-CM

## 2015-07-12 DIAGNOSIS — Z96651 Presence of right artificial knee joint: Secondary | ICD-10-CM

## 2015-07-12 DIAGNOSIS — M6281 Muscle weakness (generalized): Secondary | ICD-10-CM

## 2015-07-12 DIAGNOSIS — Z7409 Other reduced mobility: Secondary | ICD-10-CM

## 2015-07-12 NOTE — Therapy (Signed)
[place], [place], [address] Phone: [phone]   Fax:  [phone]  Physical Therapy Treatment  Patient Details  Name: Gary Carroll MRN: [id number] Date of Birth: 01-02-1946 Referring Provider: Dr. Augustin Coupe   Encounter Date: 07/12/2015      PT End of Session - 07/12/15 1147    Visit Number 2   Number of Visits 18   Date for PT Re-Evaluation 08/07/15   Authorization Type [employer] Medicare primary, [place] insurance secondary    Authorization Time Period 07/10/15 to 09/07/15   Authorization - Visit Number 2   Authorization - Number of Visits 10   PT Start Time 1130   PT Stop Time 1220   PT Time Calculation (min) 50 min   Equipment Utilized During Treatment Gait belt   Activity Tolerance Patient tolerated treatment well   Behavior During Therapy [facility] for tasks assessed/performed      Past Medical History  Diagnosis Date  . Bilateral knee pain     OA  . Prediabetes   . Allergic rhinitis   . Hiatal hernia   . Heart murmur   . HLD (hyperlipidemia)   . HTN (hypertension)   . Erectile dysfunction   . Arthritis   . Asthma     seasonal per pt  . GERD (gastroesophageal reflux disease)   . Diverticulosis 02/2012    seen on colonoscopy  . Urinary frequency   . Overactive bladder   . Urinary urgency   . Wears glasses   . Ringing of ears     Past Surgical History  Procedure Laterality Date  . Tonsillectomy    . Finger surgery Right   . Total knee arthroplasty  2012    left (Dr. Lorre Nick)  . Cholecystectomy    . Colonoscopy  02/2012    diverticulosis; Dr. Deatra Ina  . Total knee arthroplasty Right 06/19/2015    Procedure: TOTAL KNEE ARTHROPLASTY;  Surgeon: Vickey Huger, MD;  Location: [facility];  Service: Orthopedics;  Laterality: Right;    There were no vitals filed for this visit.  Visit Diagnosis:  History of total right knee replacement  Localized edema  Knee stiffness, right  Muscle  weakness  Difficulty walking  Impaired functional mobility and activity tolerance      Subjective Assessment - 07/12/15 1125    Subjective Pt stated he had increased LBP this morning pain scale 4/10, pain resolved at entrance to PT dept.  Rt knee pain tolerable at 2/10 mainly stiffness today.  Pt reports compliance with HEP 1-2x a day.   Pertinent History prediabetic, hx of hernia, GERD, poor bladder control   Patient Stated Goals get back to swimming, walk up and down stairs, driving    Currently in Pain? Yes   Pain Score 2    Pain Location Knee   Pain Orientation Right   Pain Descriptors / Indicators Tightness  Stiffness   Pain Type Surgical pain   Pain Radiating Towards no radicular symptoms   Pain Onset 1 to 4 weeks ago   Pain Frequency Intermittent   Aggravating Factors  pushing ROM   Pain Relieving Factors rest    Effect of Pain on Daily Activities cannot exercise or drive   Pain Score 0   Pain Location Back            [facility] Adult PT Treatment/Exercise - 07/12/15 0001    Knee/Hip Exercises: Stretches   Active Hamstring Stretch 3 reps;30 seconds  Active Hamstring Stretch Limitations supine with rope   Knee: Self-Stretch Limitations knee drives on 8in step [id number]"   Gastroc Stretch 3 reps;30 seconds   Gastroc Stretch Limitations slantboard    Knee/Hip Exercises: Standing   Gait Training Gait training with cueing for heel to toe pattern, equal stance and stride length, unsteadiness with min A required for LOB no AD.  552 feet   Knee/Hip Exercises: Supine   Quad Sets Right;10 reps   Short Arc [employer] Right;10 reps   Short Arc Quad Sets Limitations 5" holds   Heel Slides 10 reps   Heel Slides Limitations 3" holds   Bridges Limitations 10x 3"   Straight Leg Raises 10 reps   Knee/Hip Exercises: Sidelying   Hip ABduction Both;10 reps   Modalities   Modalities Cryotherapy   Cryotherapy   Number Minutes Cryotherapy 8 Minutes   Cryotherapy Location Knee   Type  of Cryotherapy Ice pack                  PT Short Term Goals - 07/10/15 1455    PT SHORT TERM GOAL #1   Title Patient will demonstrate R knee ROM of 0-120 degrees in order to reduce pain, enhance mobility, and improve overall level of function    Time 4   Period Weeks   Status New   PT SHORT TERM GOAL #2   Title Paitent will be independent in consistently and correctly performing at least 3 edema management techniques, including but not limited to ice, retrograde massage, and stretching/exercises, in order to enhance independence in managing condition    Time 4   Period Weeks   Status New   PT SHORT TERM GOAL #3   Title Patient will demonstrate improved gait mechanics, including even step lengths and stance times, improved posture, improved gait speed, and minimal unsteadiness in order to enhance overall mobility and assist in returning to community    Time 4   Period Weeks   Status New   PT SHORT TERM GOAL #4   Title Patient to be independent in correctly and consistently perofrming appropriate HEP, to be updated PRN    Time 4   Period Weeks   Status New           PT Long Term Goals - 07/10/15 1509    PT LONG TERM GOAL #1   Title Patient to demonstrate strength at least 4+/5 in all tested muscle groups in order to reduce pain, enhance mobility, and assist in return to community based tasks    Time 8   Period Weeks   Status New   PT LONG TERM GOAL #2   Title Patient to report that he has been able to return to at least 20 minutes of swimming, at least 3 times per week, with pain R knee no more than 1/10 and minimal fatigue, after being cleared by MD to do so, in order to assist in improved functional actiivty tolerance and returning to PLOF    Time 8   Period Weeks   Status New   PT LONG TERM GOAL #3   Title Patient will be able to perform TUG in 9 seconds or less with no assistive device and will be able to ambulate at least 122ft during 6MWT with no device or  rest breaks in order to reduce fall risk and return to community based activities    Time 8   Period Weeks   Status New  PT LONG TERM GOAL #4   Title Patient to be able to reciprocally ascend and descend full flight of stiars with one railing and minimal unsteadiness, good eccentric control, in order to improve safe mobilty at home and in community    Time [place] - 07/12/15 1214    Clinical Impression Statement Reviewed goals, compliance with HHPT HEP, and copy of evaluation given to pt.  Session focus on improving gait mechanics and ROM based activities to reduce stiffness and improve knee mobilty.  Verbal cueing with gait training to imprve heel to toe pattern, equalize stance phase and improve stride length, noted unsteadiness with gait mechanics requiring  min assistance and pt with tendency to reach for wall for recovery.  Pt explained importance of proper posture and to improve knee extension with gait mechanics to equalize stance phase and improve leg length discrepency for back pain. ROM based exercises and stretches complete with minimal cueing required for proper form and technique.  Pt required 3 restroom breaks through session, encouraged to speak to MD about increased urinary frequency.  No reports of increased pain through session.  Ice was applied at end of session for pain and edema control.     Pt will benefit from skilled therapeutic intervention in order to improve on the following deficits Abnormal gait;Hypomobility;Increased edema;Decreased activity tolerance;Decreased strength;Pain;Decreased balance;Decreased mobility;Difficulty walking;Improper body mechanics;Decreased coordination;Postural dysfunction   Rehab Potential Good   Clinical Impairments Affecting Rehab Potential GERD, high urinary frequency, prediabetic    PT Treatment/Interventions ADLs/Self Care Home Management;Cryotherapy;Electrical Stimulation;DME  Instruction;Gait training;Stair training;Functional mobility training;Therapeutic activities;Therapeutic exercise;Balance training;Neuromuscular re-education;Patient/family education;Manual techniques;Scar mobilization;Energy conservation;Taping   PT Next Visit Plan Continue gait training, explain importance of ambulation with AD to reduce risk of falls, continue ROM based activities with stretches and strengthening, balance training, manual PRN.   PT Home Exercise Plan HHPT HEP for now         Problem List Patient Active Problem List   Diagnosis Date Noted  . 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 Peripheral edema 08/31/2012  . Urinary frequency 08/31/2012  . Diverticulosis 03/23/2012  . Impaired fasting glucose 02/06/2012  . Decreased libido 03/07/2011  . Erectile dysfunction 03/07/2011  . Essential hypertension, benign 12/20/2010  . Mixed hyperlipidemia 12/20/2010  . ALLERGIC RHINITIS, SEASONAL 05/02/2010  . 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 HIATAL HERNIA, HX OF 05/02/2010   Ihor Austin, [place]; Guy  Aldona Lento 07/12/2015, 1:57 PM  [place], [place], [address] Phone: [phone]   Fax:  [phone]  Name: Gary Carroll MRN: [id number] Date of Birth: Oct 05, 1945

## 2015-07-14 ENCOUNTER — Ambulatory Visit (HOSPITAL_COMMUNITY): Payer: No Typology Code available for payment source | Admitting: Physical Therapy

## 2015-07-14 DIAGNOSIS — Z7409 Other reduced mobility: Secondary | ICD-10-CM

## 2015-07-14 DIAGNOSIS — R262 Difficulty in walking, not elsewhere classified: Secondary | ICD-10-CM

## 2015-07-14 DIAGNOSIS — Z96651 Presence of right artificial knee joint: Secondary | ICD-10-CM

## 2015-07-14 DIAGNOSIS — M25661 Stiffness of right knee, not elsewhere classified: Secondary | ICD-10-CM

## 2015-07-14 DIAGNOSIS — M6281 Muscle weakness (generalized): Secondary | ICD-10-CM

## 2015-07-14 DIAGNOSIS — R600 Localized edema: Secondary | ICD-10-CM

## 2015-07-14 NOTE — Therapy (Signed)
[place], [place], [address] Phone: [phone]   Fax:  [phone]  Physical Therapy Treatment  Patient Details  Name: Gary Carroll MRN: [id number] Date of Birth: 21-Feb-1946 Referring Provider: Dr. Augustin Coupe   Encounter Date: 07/14/2015      PT End of Session - 07/14/15 1257    Visit Number 3   Number of Visits 18   Date for PT Re-Evaluation 08/07/15   Authorization Type [employer] Medicare primary, [place] insurance secondary    Authorization Time Period 07/10/15 to 09/07/15   Authorization - Visit Number 3   Authorization - Number of Visits 10   PT Start Time 1150   PT Stop Time [id number]   PT Time Calculation (min) 45 min   Equipment Utilized During Treatment Gait belt   Activity Tolerance Patient tolerated treatment well   Behavior During Therapy [facility] for tasks assessed/performed      Past Medical History  Diagnosis Date  . Bilateral knee pain     OA  . Prediabetes   . Allergic rhinitis   . Hiatal hernia   . Heart murmur   . HLD (hyperlipidemia)   . HTN (hypertension)   . Erectile dysfunction   . Arthritis   . Asthma     seasonal per pt  . GERD (gastroesophageal reflux disease)   . Diverticulosis 02/2012    seen on colonoscopy  . Urinary frequency   . Overactive bladder   . Urinary urgency   . Wears glasses   . Ringing of ears     Past Surgical History  Procedure Laterality Date  . Tonsillectomy    . Finger surgery Right   . Total knee arthroplasty  2012    left (Dr. Lorre Nick)  . Cholecystectomy    . Colonoscopy  02/2012    diverticulosis; Dr. Deatra Ina  . Total knee arthroplasty Right 06/19/2015    Procedure: TOTAL KNEE ARTHROPLASTY;  Surgeon: Vickey Huger, MD;  Location: [place];  Service: Orthopedics;  Laterality: Right;    There were no vitals filed for this visit.  Visit Diagnosis:  No diagnosis found.      Subjective Assessment - 07/14/15 1156    Subjective Pt states he is not having  much pain today, however he is not sleeping well due to having to urinate.  Went to his primary MD yesterday who informed him these are the late affects of anesthesia and should get better.   Currently in Pain? Yes   Pain Score 2    Pain Location Knee   Pain Orientation Right   Pain Descriptors / Indicators Sore   Pain Type Surgical pain                         [facility] Adult PT Treatment/Exercise - 07/14/15 0001    Knee/Hip Exercises: Stretches   Knee: Self-Stretch Limitations knee drives on 8in step [id number] 10"   Gastroc Stretch 3 reps;30 seconds   Gastroc Stretch Limitations slantboard    Knee/Hip Exercises: Supine   Quad Sets Right;15 reps   Short Arc Quad Sets Right;15 reps   Short Arc Quad Sets Limitations 5"   Heel Slides 10 reps   Heel Slides Limitations 3" holds   Bridges Limitations 15x 3"   Straight Leg Raises 15 reps  3 sets of 5 reps   Modalities   Modalities Cryotherapy   Cryotherapy   Number Minutes Cryotherapy 5 Minutes  Cryotherapy Location Knee  Rt knee   Type of Cryotherapy Ice pack   Manual Therapy   Manual Therapy Edema management   Manual therapy comments completed at end of session separately from therex and prior to ice application   Edema Management retro with LE elevated                  PT Short Term Goals - 07/10/15 1455    PT SHORT TERM GOAL #1   Title Patient will demonstrate R knee ROM of 0-120 degrees in order to reduce pain, enhance mobility, and improve overall level of function    Time 4   Period Weeks   Status New   PT SHORT TERM GOAL #2   Title Paitent will be independent in consistently and correctly performing at least 3 edema management techniques, including but not limited to ice, retrograde massage, and stretching/exercises, in order to enhance independence in managing condition    Time 4   Period Weeks   Status New   PT SHORT TERM GOAL #3   Title Patient will demonstrate improved gait mechanics, including  even step lengths and stance times, improved posture, improved gait speed, and minimal unsteadiness in order to enhance overall mobility and assist in returning to community    Time 4   Period Weeks   Status New   PT SHORT TERM GOAL #4   Title Patient to be independent in correctly and consistently perofrming appropriate HEP, to be updated PRN    Time 4   Period Weeks   Status New           PT Long Term Goals - 07/10/15 1509    PT LONG TERM GOAL #1   Title Patient to demonstrate strength at least 4+/5 in all tested muscle groups in order to reduce pain, enhance mobility, and assist in return to community based tasks    Time 8   Period Weeks   Status New   PT LONG TERM GOAL #2   Title Patient to report that he has been able to return to at least 20 minutes of swimming, at least 3 times per week, with pain R knee no more than 1/10 and minimal fatigue, after being cleared by MD to do so, in order to assist in improved functional actiivty tolerance and returning to PLOF    Time 8   Period Weeks   Status New   PT LONG TERM GOAL #3   Title Patient will be able to perform TUG in 9 seconds or less with no assistive device and will be able to ambulate at least 1288ft during 6MWT with no device or rest breaks in order to reduce fall risk and return to community based activities    Time 8   Period Weeks   Status New   PT LONG TERM GOAL #4   Title Patient to be able to reciprocally ascend and descend full flight of stiars with one railing and minimal unsteadiness, good eccentric control, in order to improve safe mobilty at home and in community    Time 8   Period Weeks   Status New               Plan - 07/14/15 1257    Clinical Impression Statement Continued with focus on improving Rt knee ROM and overall functional strength.  PT with little discomfort in knee, however with noted induration.  Added retro massage at end of session to attempt to decrease induration  and improve ROM.   Educated patient on compression stockings as his Lt LE is also edematous, however patient is not interested in acquiring these.  No new exercises added this session as patient  requested to go light today due to lack of sleep and general fatigue.  PT wtih good control and holds with therex.  completed 15 reps of SLR but in 3 sets due to weakness.   Pt will benefit from skilled therapeutic intervention in order to improve on the following deficits Abnormal gait;Hypomobility;Increased edema;Decreased activity tolerance;Decreased strength;Pain;Decreased balance;Decreased mobility;Difficulty walking;Improper body mechanics;Decreased coordination;Postural dysfunction   Rehab Potential Good   Clinical Impairments Affecting Rehab Potential GERD, high urinary frequency, prediabetic    PT Treatment/Interventions ADLs/Self Care Home Management;Cryotherapy;Electrical Stimulation;DME Instruction;Gait training;Stair training;Functional mobility training;Therapeutic activities;Therapeutic exercise;Balance training;Neuromuscular re-education;Patient/family education;Manual techniques;Scar mobilization;Energy conservation;Taping   PT Next Visit Plan Continue gait training, explain importance of ambulation with AD to reduce risk of falls, continue ROM based activities with stretches and strengthening, balance training, manual PRN.   PT Home Exercise Plan HHPT HEP for now         Problem List Patient Active Problem List   Diagnosis Date Noted  . 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 Peripheral edema 08/31/2012  . Urinary frequency 08/31/2012  . Diverticulosis 03/23/2012  . Impaired fasting glucose 02/06/2012  . Decreased libido 03/07/2011  . Erectile dysfunction 03/07/2011  . Essential hypertension, benign 12/20/2010  . Mixed hyperlipidemia 12/20/2010  . ALLERGIC RHINITIS, SEASONAL 05/02/2010  . 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 HIATAL HERNIA, HX OF 05/02/2010    Teena Irani,  PTA/CLT [phone]  07/14/2015, 4:16 PM  [facility] [address] [place], [place], [address] Phone: [phone]   Fax:  [phone]  Name: BERAT HEAVEY MRN: [id number] Date of Birth: March 07, 1946

## 2015-07-17 ENCOUNTER — Ambulatory Visit (HOSPITAL_COMMUNITY): Payer: No Typology Code available for payment source | Admitting: Physical Therapy

## 2015-07-17 DIAGNOSIS — Z96651 Presence of right artificial knee joint: Secondary | ICD-10-CM | POA: Diagnosis not present

## 2015-07-17 DIAGNOSIS — M6281 Muscle weakness (generalized): Secondary | ICD-10-CM

## 2015-07-17 DIAGNOSIS — M25661 Stiffness of right knee, not elsewhere classified: Secondary | ICD-10-CM

## 2015-07-17 DIAGNOSIS — Z7409 Other reduced mobility: Secondary | ICD-10-CM

## 2015-07-17 DIAGNOSIS — R600 Localized edema: Secondary | ICD-10-CM

## 2015-07-17 DIAGNOSIS — R262 Difficulty in walking, not elsewhere classified: Secondary | ICD-10-CM

## 2015-07-17 NOTE — Therapy (Signed)
[place], [place], [address] Phone: [phone]   Fax:  [phone]  Physical Therapy Treatment  Patient Details  Name: Gary Carroll MRN: [id number] Date of Birth: 1946/04/13 Referring Provider: Dr. Augustin Coupe   Encounter Date: 07/17/2015      PT End of Session - 07/17/15 1400    Visit Number 4   Number of Visits 18   Date for PT Re-Evaluation 08/07/15   Authorization Type [employer] Medicare primary, [place] insurance secondary    Authorization Time Period 07/10/15 to 09/07/15   Authorization - Visit Number 4   Authorization - Number of Visits 10   PT Start Time 1304   PT Stop Time 1347   PT Time Calculation (min) 43 min   Equipment Utilized During Treatment Gait belt   Activity Tolerance Patient tolerated treatment well   Behavior During Therapy [facility] for tasks assessed/performed      Past Medical History  Diagnosis Date  . Bilateral knee pain     OA  . Prediabetes   . Allergic rhinitis   . Hiatal hernia   . Heart murmur   . HLD (hyperlipidemia)   . HTN (hypertension)   . Erectile dysfunction   . Arthritis   . Asthma     seasonal per pt  . GERD (gastroesophageal reflux disease)   . Diverticulosis 02/2012    seen on colonoscopy  . Urinary frequency   . Overactive bladder   . Urinary urgency   . Wears glasses   . Ringing of ears     Past Surgical History  Procedure Laterality Date  . Tonsillectomy    . Finger surgery Right   . Total knee arthroplasty  2012    left (Dr. Lorre Nick)  . Cholecystectomy    . Colonoscopy  02/2012    diverticulosis; Dr. Deatra Ina  . Total knee arthroplasty Right 06/19/2015    Procedure: TOTAL KNEE ARTHROPLASTY;  Surgeon: Vickey Huger, MD;  Location: [place];  Service: Orthopedics;  Laterality: Right;    There were no vitals filed for this visit.  Visit Diagnosis:  History of total right knee replacement  Localized edema  Knee stiffness, right  Muscle  weakness  Difficulty walking  Impaired functional mobility and activity tolerance      Subjective Assessment - 07/17/15 1420    Subjective Pt states he is not hurting today just stiff.  Reports decreased fatigue today and didn't do alot over the weekend.     Currently in Pain? No/denies                         [facility] Adult PT Treatment/Exercise - 07/17/15 0001    Knee/Hip Exercises: Stretches   Active Hamstring Stretch 3 reps;30 seconds   Active Hamstring Stretch Limitations standing 8" box   Knee: Self-Stretch Limitations knee drives on 8in step [id number] 10"   Knee/Hip Exercises: Supine   Quad Sets 20 reps   Short Arc Quad Sets Right;15 reps   Short Arc Quad Sets Limitations 5"   Heel Slides Right;2 sets;10 reps   Heel Slides Limitations 3" holds   Bridges Limitations 2 sets 10 reps   Straight Leg Raises 2 sets;10 reps;Right   Knee/Hip Exercises: Sidelying   Hip ABduction Right;2 sets;10 reps   Modalities   Modalities Cryotherapy   Cryotherapy   Number Minutes Cryotherapy 5 Minutes   Cryotherapy Location Knee  right   Type of Cryotherapy Ice  pack   Manual Therapy   Manual Therapy Edema management   Manual therapy comments completed at end of session separately from therex and prior to ice application   Edema Management retro with LE elevated                  PT Short Term Goals - 07/10/15 1455    PT SHORT TERM GOAL #1   Title Patient will demonstrate R knee ROM of 0-120 degrees in order to reduce pain, enhance mobility, and improve overall level of function    Time 4   Period Weeks   Status New   PT SHORT TERM GOAL #2   Title Paitent will be independent in consistently and correctly performing at least 3 edema management techniques, including but not limited to ice, retrograde massage, and stretching/exercises, in order to enhance independence in managing condition    Time 4   Period Weeks   Status New   PT SHORT TERM GOAL #3   Title Patient  will demonstrate improved gait mechanics, including even step lengths and stance times, improved posture, improved gait speed, and minimal unsteadiness in order to enhance overall mobility and assist in returning to community    Time 4   Period Weeks   Status New   PT SHORT TERM GOAL #4   Title Patient to be independent in correctly and consistently perofrming appropriate HEP, to be updated PRN    Time 4   Period Weeks   Status New           PT Long Term Goals - 07/10/15 1509    PT LONG TERM GOAL #1   Title Patient to demonstrate strength at least 4+/5 in all tested muscle groups in order to reduce pain, enhance mobility, and assist in return to community based tasks    Time 8   Period Weeks   Status New   PT LONG TERM GOAL #2   Title Patient to report that he has been able to return to at least 20 minutes of swimming, at least 3 times per week, with pain R knee no more than 1/10 and minimal fatigue, after being cleared by MD to do so, in order to assist in improved functional actiivty tolerance and returning to PLOF    Time 8   Period Weeks   Status New   PT LONG TERM GOAL #3   Title Patient will be able to perform TUG in 9 seconds or less with no assistive device and will be able to ambulate at least 1259ft during 6MWT with no device or rest breaks in order to reduce fall risk and return to community based activities    Time 8   Period Weeks   Status New   PT LONG TERM GOAL #4   Title Patient to be able to reciprocally ascend and descend full flight of stiars with one railing and minimal unsteadiness, good eccentric control, in order to improve safe mobilty at home and in community    Time 8   Period Weeks   Status New               Plan - 07/17/15 1401    Clinical Impression Statement Continued with focus on improving ROM through decreasing edema and AROM.   Increased to 2 sets of 10-15 reps today with most exercises.  Most difficulty lifting LE's (i.e onto box for  HS stretch, onto bed, etc.) Pt questionning driving and instructed to contact MD office  to get cleared.  Added SLS on Rt today requiring 1 HHA for 30" hold.  Pt wtih less fatigue today, however required 3 urination breaks during session.  Edema and induration remains high in Rt LE.  completed manual to help decrease this and encouraged patient once again to look into compression garments.  PT verbalized understanding, however reports he is not interested in these.    Pt will benefit from skilled therapeutic intervention in order to improve on the following deficits Abnormal gait;Hypomobility;Increased edema;Decreased activity tolerance;Decreased strength;Pain;Decreased balance;Decreased mobility;Difficulty walking;Improper body mechanics;Decreased coordination;Postural dysfunction   Rehab Potential Good   Clinical Impairments Affecting Rehab Potential GERD, high urinary frequency, prediabetic    PT Treatment/Interventions ADLs/Self Care Home Management;Cryotherapy;Electrical Stimulation;DME Instruction;Gait training;Stair training;Functional mobility training;Therapeutic activities;Therapeutic exercise;Balance training;Neuromuscular re-education;Patient/family education;Manual techniques;Scar mobilization;Energy conservation;Taping   PT Next Visit Plan Continue gait training, explain importance of ambulation with AD to reduce risk of falls, continue ROM based activities with stretches and strengthening, balance training, manual PRN..  Add lunges, step ups and hip flexion in standing next session.         Problem List Patient Active Problem List   Diagnosis Date Noted  . 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 Peripheral edema 08/31/2012  . Urinary frequency 08/31/2012  . Diverticulosis 03/23/2012  . Impaired fasting glucose 02/06/2012  . Decreased libido 03/07/2011  . Erectile dysfunction 03/07/2011  . Essential hypertension, benign 12/20/2010  . Mixed hyperlipidemia 12/20/2010  . ALLERGIC  RHINITIS, SEASONAL 05/02/2010  . 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 HIATAL HERNIA, HX OF 05/02/2010    Teena Irani, PTA/CLT [phone]  07/17/2015, 2:21 PM  [place], [place], [address] Phone: [phone]   Fax:  [phone]  Name: Gary Carroll MRN: [id number] Date of Birth: 1946/01/31

## 2015-07-19 ENCOUNTER — Ambulatory Visit (HOSPITAL_COMMUNITY): Payer: No Typology Code available for payment source | Attending: Orthopedic Surgery

## 2015-07-19 ENCOUNTER — Telehealth (HOSPITAL_COMMUNITY): Payer: Self-pay | Admitting: Physical Therapy

## 2015-07-19 DIAGNOSIS — R262 Difficulty in walking, not elsewhere classified: Secondary | ICD-10-CM | POA: Diagnosis present

## 2015-07-19 DIAGNOSIS — R600 Localized edema: Secondary | ICD-10-CM

## 2015-07-19 DIAGNOSIS — M25661 Stiffness of right knee, not elsewhere classified: Secondary | ICD-10-CM | POA: Diagnosis present

## 2015-07-19 DIAGNOSIS — Z96651 Presence of right artificial knee joint: Secondary | ICD-10-CM

## 2015-07-19 DIAGNOSIS — M6281 Muscle weakness (generalized): Secondary | ICD-10-CM

## 2015-07-19 DIAGNOSIS — Z7409 Other reduced mobility: Secondary | ICD-10-CM

## 2015-07-19 NOTE — Telephone Encounter (Signed)
Santiago Glad w/[employer] will fax approval today 07/19/15 per phone call. NF

## 2015-07-19 NOTE — Therapy (Signed)
[place], [place], [address] Phone: [phone]   Fax:  [phone]  Physical Therapy Treatment  Patient Details  Name: Gary Carroll MRN: [id number] Date of Birth: Apr 15, 1946 Referring Provider: Dr. Augustin Coupe  Encounter Date: 07/19/2015      PT End of Session - 07/19/15 1308    Visit Number 5   Number of Visits 18   Date for PT Re-Evaluation 08/07/15   Authorization Type [employer] Medicare primary, [place] insurance secondary    Authorization Time Period 07/10/15 to 09/07/15   Authorization - Visit Number 5   Authorization - Number of Visits 10   PT Start Time 1300   PT Stop Time 1350   PT Time Calculation (min) 50 min   Activity Tolerance Patient tolerated treatment well   Behavior During Therapy [facility] for tasks assessed/performed      Past Medical History  Diagnosis Date  . Bilateral knee pain     OA  . Prediabetes   . Allergic rhinitis   . Hiatal hernia   . Heart murmur   . HLD (hyperlipidemia)   . HTN (hypertension)   . Erectile dysfunction   . Arthritis   . Asthma     seasonal per pt  . GERD (gastroesophageal reflux disease)   . Diverticulosis 02/2012    seen on colonoscopy  . Urinary frequency   . Overactive bladder   . Urinary urgency   . Wears glasses   . Ringing of ears     Past Surgical History  Procedure Laterality Date  . Tonsillectomy    . Finger surgery Right   . Total knee arthroplasty  2012    left (Dr. Lorre Nick)  . Cholecystectomy    . Colonoscopy  02/2012    diverticulosis; Dr. Deatra Ina  . Total knee arthroplasty Right 06/19/2015    Procedure: TOTAL KNEE ARTHROPLASTY;  Surgeon: Vickey Huger, MD;  Location: [facility];  Service: Orthopedics;  Laterality: Right;    There were no vitals filed for this visit.  Visit Diagnosis:  Localized edema  Knee stiffness, right  Muscle weakness  Difficulty walking  Impaired functional mobility and activity tolerance  History of total  right knee replacement      Subjective Assessment - 07/19/15 1258    Subjective Pt reports he called MD Monday and got verbal permission to drive car.  Stated he went to [facility] yesterday to get on pick.  Went to PCP and asked about increased urination and stated he feels due to medication with surgery.  No reports of pain knee today, Lt knee does feel a little stiff today, Rt knee feel good.   Pertinent History prediabetic, hx of hernia, GERD, poor bladder control   Patient Stated Goals get back to swimming, walk up and down stairs, driving    Currently in Pain? No/denies            [facility] PT Assessment - 07/19/15 0001    Assessment   Medical Diagnosis R total knee replacement    Referring Provider Dr. Augustin Coupe   Onset Date/Surgical Date 06/19/15   Next MD Visit 08/01/2015 Dr Lorre Nick                     [facility] Adult PT Treatment/Exercise - 07/19/15 0001    Knee/Hip Exercises: Stretches   Active Hamstring Stretch 3 reps;30 seconds   Active Hamstring Stretch Limitations standing 8" box   Knee: Self-Stretch Limitations knee drives  on 8in step 10x 10"   Knee/Hip Exercises: Standing   Forward Lunges Both;10 reps   Forward Lunges Limitations 8in step   Lateral Step Up Right;10 reps;Hand Hold: 1;Step Height: 4"   Lateral Step Up Limitations therapist facilitation   Forward Step Up Right;10 reps;Hand Hold: 1;Step Height: 4"   Gait Training Discussed importance of utilizing AD with gait to reduce risk of falls   Other Standing Knee Exercises marching with intermittent 2 finger assistance 10x 5" holds   Knee/Hip Exercises: Supine   Quad Sets 20 reps   Short Arc Quad Sets Right;15 reps   Short Arc Quad Sets Limitations 5" holds   Heel Slides 15 reps   Heel Slides Limitations 3" holds   Bridges Limitations 15x 3"   Straight Leg Raises 2 sets;10 reps;Right   Other Supine Knee/Hip Exercises AROM 2-115 degrees   Modalities   Modalities Cryotherapy   Cryotherapy   Number  Minutes Cryotherapy 5 Minutes   Cryotherapy Location Knee  Rt elevated   Type of Cryotherapy Ice pack   Manual Therapy   Manual Therapy Edema management   Manual therapy comments completed at end of session separately from therex and prior to ice application   Edema Management retro with LE elevated                PT Education - 07/19/15 1328    Education provided Yes   Education Details Discussed importance of utilizing AD with gait to reduce risk of falls   Person(s) Educated Patient   Methods Explanation   Comprehension Verbalized understanding;Returned demonstration          PT Short Term Goals - 07/10/15 1455    PT SHORT TERM GOAL #1   Title Patient will demonstrate R knee ROM of 0-120 degrees in order to reduce pain, enhance mobility, and improve overall level of function    Time 4   Period Weeks   Status New   PT SHORT TERM GOAL #2   Title Paitent will be independent in consistently and correctly performing at least 3 edema management techniques, including but not limited to ice, retrograde massage, and stretching/exercises, in order to enhance independence in managing condition    Time 4   Period Weeks   Status New   PT SHORT TERM GOAL #3   Title Patient will demonstrate improved gait mechanics, including even step lengths and stance times, improved posture, improved gait speed, and minimal unsteadiness in order to enhance overall mobility and assist in returning to community    Time 4   Period Weeks   Status New   PT SHORT TERM GOAL #4   Title Patient to be independent in correctly and consistently perofrming appropriate HEP, to be updated PRN    Time 4   Period Weeks   Status New           PT Long Term Goals - 07/10/15 1509    PT LONG TERM GOAL #1   Title Patient to demonstrate strength at least 4+/5 in all tested muscle groups in order to reduce pain, enhance mobility, and assist in return to community based tasks    Time 8   Period Weeks    Status New   PT LONG TERM GOAL #2   Title Patient to report that he has been able to return to at least 20 minutes of swimming, at least 3 times per week, with pain R knee no more than 1/10 and minimal fatigue, after  being cleared by MD to do so, in order to assist in improved functional actiivty tolerance and returning to PLOF    Time 8   Period Weeks   Status New   PT LONG TERM GOAL #3   Title Patient will be able to perform TUG in 9 seconds or less with no assistive device and will be able to ambulate at least 1264ft during 6MWT with no device or rest breaks in order to reduce fall risk and return to community based activities    Time 8   Period Weeks   Status New   PT LONG TERM GOAL #4   Title Patient to be able to reciprocally ascend and descend full flight of stiars with one railing and minimal unsteadiness, good eccentric control, in order to improve safe mobilty at home and in community    Time 8   Period Weeks   Status New               Plan - 07/19/15 1353    Clinical Impression Statement Pt continues to make gait with AROM 2-115 degrees today following stretches and ROM based exercises.  Pt continues to exhibit weakness in hip musculature.  Progressed to standing exercises for functional strengthening including lunges, standing hip flexion and step up training.  Min cueing for form and technqiue with new activities.  Pt required 3 urination breaks during this session.  Edema and induration remains high in Rt LE, ended sessoin wtih retro massage to help decrease swelling for pain and edema control.  Ice applied with LE elevated for 5 minutes following manual.  Continud to discuss compressoin garmets, pt reports understanding though states too difficult to place on legs.  Discussed purchasing assistnace supplies to help put on for edema control.  No reports of increased pain at end of session.     Pt will benefit from skilled therapeutic intervention in order to improve on the  following deficits Abnormal gait;Hypomobility;Increased edema;Decreased activity tolerance;Decreased strength;Pain;Decreased balance;Decreased mobility;Difficulty walking;Improper body mechanics;Decreased coordination;Postural dysfunction   Rehab Potential Good   Clinical Impairments Affecting Rehab Potential GERD, high urinary frequency, prediabetic    PT Frequency 3x / week   PT Duration 8 weeks   PT Treatment/Interventions ADLs/Self Care Home Management;Cryotherapy;Electrical Stimulation;DME Instruction;Gait training;Stair training;Functional mobility training;Therapeutic activities;Therapeutic exercise;Balance training;Neuromuscular re-education;Patient/family education;Manual techniques;Scar mobilization;Energy conservation;Taping   PT Next Visit Plan Continue gait training, explain importance of ambulation with AD to reduce risk of falls, continue ROM based activities with stretches and strengthening, balance training, manual PRN.Marland Kitchen  Continue progressing standing exercises for functional strengthening         Problem List Patient Active Problem List   Diagnosis Date Noted  . 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 Peripheral edema 08/31/2012  . Urinary frequency 08/31/2012  . Diverticulosis 03/23/2012  . Impaired fasting glucose 02/06/2012  . Decreased libido 03/07/2011  . Erectile dysfunction 03/07/2011  . Essential hypertension, benign 12/20/2010  . Mixed hyperlipidemia 12/20/2010  . ALLERGIC RHINITIS, SEASONAL 05/02/2010  . 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 HIATAL HERNIA, HX OF 05/02/2010   Ihor Austin, [place]; [place]  Aldona Lento 07/19/2015, 2:27 PM  [place], [place], [address] Phone: [phone]   Fax:  [phone]  Name: Gary Carroll MRN: [id number] Date of Birth: 03/15/1946

## 2015-07-21 ENCOUNTER — Ambulatory Visit (HOSPITAL_COMMUNITY): Payer: No Typology Code available for payment source | Admitting: Physical Therapy

## 2015-07-21 DIAGNOSIS — Z7409 Other reduced mobility: Secondary | ICD-10-CM

## 2015-07-21 DIAGNOSIS — M6281 Muscle weakness (generalized): Secondary | ICD-10-CM

## 2015-07-21 DIAGNOSIS — Z96651 Presence of right artificial knee joint: Secondary | ICD-10-CM

## 2015-07-21 DIAGNOSIS — R600 Localized edema: Secondary | ICD-10-CM

## 2015-07-21 DIAGNOSIS — R262 Difficulty in walking, not elsewhere classified: Secondary | ICD-10-CM

## 2015-07-21 DIAGNOSIS — M25661 Stiffness of right knee, not elsewhere classified: Secondary | ICD-10-CM

## 2015-07-21 NOTE — Therapy (Signed)
[place], [place], [address] Phone: [phone]   Fax:  [phone]  Physical Therapy Treatment  Patient Details  Name: Gary Carroll MRN: [id number] Date of Birth: 15-Dec-1945 Referring Provider: Dr. Augustin Coupe  Encounter Date: 07/21/2015      PT End of Session - 07/21/15 1555    Visit Number 6   Number of Visits 18   Date for PT Re-Evaluation 08/07/15   Authorization Type [employer] Medicare primary, [place] insurance secondary    Authorization Time Period 07/10/15 to 09/07/15   Authorization - Visit Number 6   Authorization - Number of Visits 10   PT Start Time [id number]   PT Stop Time 1610   PT Time Calculation (min) 52 min   Activity Tolerance Patient tolerated treatment well   Behavior During Therapy [facility] for tasks assessed/performed      Past Medical History  Diagnosis Date  . Bilateral knee pain     OA  . Prediabetes   . Allergic rhinitis   . Hiatal hernia   . Heart murmur   . HLD (hyperlipidemia)   . HTN (hypertension)   . Erectile dysfunction   . Arthritis   . Asthma     seasonal per pt  . GERD (gastroesophageal reflux disease)   . Diverticulosis 02/2012    seen on colonoscopy  . Urinary frequency   . Overactive bladder   . Urinary urgency   . Wears glasses   . Ringing of ears     Past Surgical History  Procedure Laterality Date  . Tonsillectomy    . Finger surgery Right   . Total knee arthroplasty  2012    left (Dr. Lorre Nick)  . Cholecystectomy    . Colonoscopy  02/2012    diverticulosis; Dr. Deatra Ina  . Total knee arthroplasty Right 06/19/2015    Procedure: TOTAL KNEE ARTHROPLASTY;  Surgeon: Vickey Huger, MD;  Location: [facility];  Service: Orthopedics;  Laterality: Right;    There were no vitals filed for this visit.  Visit Diagnosis:  Localized edema  Knee stiffness, right  Muscle weakness  Difficulty walking  Impaired functional mobility and activity tolerance  History of total  right knee replacement      Subjective Assessment - 07/21/15 1608    Subjective PT states he did not feel good yesterday so didn't do any of his exercises.  Feels better today with his Lt knee hurting more than his Rt.    Currently in Pain? Yes   Pain Score 2    Pain Location Knee   Pain Orientation Right   Pain Descriptors / Indicators Aching                         [facility] Adult PT Treatment/Exercise - 07/21/15 1521    Knee/Hip Exercises: Stretches   Active Hamstring Stretch 3 reps;30 seconds   Active Hamstring Stretch Limitations standing 8" box   Knee: Self-Stretch Limitations knee drives on 8in step [id number] 10"   Gastroc Stretch 3 reps;30 seconds   Gastroc Stretch Limitations slantboard    Knee/Hip Exercises: Standing   Forward Lunges Both;10 reps   Forward Lunges Limitations 4" box   Lateral Step Up Right;10 reps;Hand Hold: 1;Step Height: 4"   Lateral Step Up Limitations therapist facilitation   Forward Step Up Right;10 reps;Hand Hold: 1;Step Height: 4"   Knee/Hip Exercises: Supine   Quad Sets 20 reps   Short Arc [employer]  Right;15 reps   Short Arc Quad Sets Limitations 5" holds   Heel Slides 15 reps   Heel Slides Limitations 3" holds   Straight Leg Raises 2 sets;10 reps;Right   Modalities   Modalities Cryotherapy   Cryotherapy   Number Minutes Cryotherapy --   Cryotherapy Location --   Type of Cryotherapy --   Manual Therapy   Manual Therapy Edema management   Manual therapy comments completed at end of session separately from therex and prior to ice application   Edema Management retro with LE elevated                  PT Short Term Goals - 07/10/15 1455    PT SHORT TERM GOAL #1   Title Patient will demonstrate R knee ROM of 0-120 degrees in order to reduce pain, enhance mobility, and improve overall level of function    Time 4   Period Weeks   Status New   PT SHORT TERM GOAL #2   Title Paitent will be independent in consistently and  correctly performing at least 3 edema management techniques, including but not limited to ice, retrograde massage, and stretching/exercises, in order to enhance independence in managing condition    Time 4   Period Weeks   Status New   PT SHORT TERM GOAL #3   Title Patient will demonstrate improved gait mechanics, including even step lengths and stance times, improved posture, improved gait speed, and minimal unsteadiness in order to enhance overall mobility and assist in returning to community    Time 4   Period Weeks   Status New   PT SHORT TERM GOAL #4   Title Patient to be independent in correctly and consistently perofrming appropriate HEP, to be updated PRN    Time 4   Period Weeks   Status New           PT Long Term Goals - 07/10/15 1509    PT LONG TERM GOAL #1   Title Patient to demonstrate strength at least 4+/5 in all tested muscle groups in order to reduce pain, enhance mobility, and assist in return to community based tasks    Time 8   Period Weeks   Status New   PT LONG TERM GOAL #2   Title Patient to report that he has been able to return to at least 20 minutes of swimming, at least 3 times per week, with pain R knee no more than 1/10 and minimal fatigue, after being cleared by MD to do so, in order to assist in improved functional actiivty tolerance and returning to PLOF    Time 8   Period Weeks   Status New   PT LONG TERM GOAL #3   Title Patient will be able to perform TUG in 9 seconds or less with no assistive device and will be able to ambulate at least 1264ft during 6MWT with no device or rest breaks in order to reduce fall risk and return to community based activities    Time 8   Period Weeks   Status New   PT LONG TERM GOAL #4   Title Patient to be able to reciprocally ascend and descend full flight of stiars with one railing and minimal unsteadiness, good eccentric control, in order to improve safe mobilty at home and in community    Time 8   Period Weeks    Status New  Plan - 07/21/15 1556    Clinical Impression Statement continued focus on increasing ROM, strength and decreasing swelling in Rt LE. Progressed lunges to 4" surface, decreasing to 1 HHA and added forward step downs with 2" box.  Attempted 4" box, however patient is too weak at this time. completed retro massage at end of session to help decrease, however induration persists.  Pt declined ice pack at end of session today.  Encouraged patient to continue with HEP for optimal results.    Pt will benefit from skilled therapeutic intervention in order to improve on the following deficits Abnormal gait;Hypomobility;Increased edema;Decreased activity tolerance;Decreased strength;Pain;Decreased balance;Decreased mobility;Difficulty walking;Improper body mechanics;Decreased coordination;Postural dysfunction   Rehab Potential Good   Clinical Impairments Affecting Rehab Potential GERD, high urinary frequency, prediabetic    PT Frequency 3x / week   PT Duration 8 weeks   PT Treatment/Interventions ADLs/Self Care Home Management;Cryotherapy;Electrical Stimulation;DME Instruction;Gait training;Stair training;Functional mobility training;Therapeutic activities;Therapeutic exercise;Balance training;Neuromuscular re-education;Patient/family education;Manual techniques;Scar mobilization;Energy conservation;Taping   PT Next Visit Plan continue to progress strength, ROM and reduce swelling in Rt LE.  Add hip strengthening exercises next session.  Begin gait training with increased clearance of LE's (pt tends to drag feet).          Problem List Patient Active Problem List   Diagnosis Date Noted  . 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 Peripheral edema 08/31/2012  . Urinary frequency 08/31/2012  . Diverticulosis 03/23/2012  . Impaired fasting glucose 02/06/2012  . Decreased libido 03/07/2011  . Erectile dysfunction 03/07/2011  . Essential hypertension, benign 12/20/2010   . Mixed hyperlipidemia 12/20/2010  . ALLERGIC RHINITIS, SEASONAL 05/02/2010  . 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 HIATAL HERNIA, HX OF 05/02/2010    Teena Irani, PTA/CLT [phone]  07/21/2015, 4:09 PM  [place], [place], [address] Phone: [phone]   Fax:  [phone]  Name: Gary Carroll MRN: [id number] Date of Birth: 03/19/1946

## 2015-07-24 ENCOUNTER — Ambulatory Visit (HOSPITAL_COMMUNITY): Payer: No Typology Code available for payment source | Admitting: Physical Therapy

## 2015-07-24 DIAGNOSIS — Z7409 Other reduced mobility: Secondary | ICD-10-CM

## 2015-07-24 DIAGNOSIS — R600 Localized edema: Secondary | ICD-10-CM | POA: Diagnosis not present

## 2015-07-24 DIAGNOSIS — M6281 Muscle weakness (generalized): Secondary | ICD-10-CM

## 2015-07-24 DIAGNOSIS — Z96651 Presence of right artificial knee joint: Secondary | ICD-10-CM

## 2015-07-24 DIAGNOSIS — M25661 Stiffness of right knee, not elsewhere classified: Secondary | ICD-10-CM

## 2015-07-24 DIAGNOSIS — R262 Difficulty in walking, not elsewhere classified: Secondary | ICD-10-CM

## 2015-07-24 NOTE — Therapy (Signed)
[place], [place], [address] Phone: [phone]   Fax:  [phone]  Physical Therapy Treatment  Patient Details  Name: Gary Carroll MRN: [id number] Date of Birth: 1945-07-10 Referring Provider: Dr. Augustin Coupe  Encounter Date: 07/24/2015      PT End of Session - 07/24/15 1317    Visit Number 7   Number of Visits 18   Date for PT Re-Evaluation 08/07/15   Authorization Type [employer] Medicare primary, [place] insurance secondary    Authorization Time Period 07/10/15 to 09/07/15   Authorization - Visit Number 7   Authorization - Number of Visits 10   PT Start Time [id number]   PT Stop Time 1345   PT Time Calculation (min) 40 min   Activity Tolerance Patient tolerated treatment well   Behavior During Therapy [facility] for tasks assessed/performed      Past Medical History  Diagnosis Date  . Bilateral knee pain     OA  . Prediabetes   . Allergic rhinitis   . Hiatal hernia   . Heart murmur   . HLD (hyperlipidemia)   . HTN (hypertension)   . Erectile dysfunction   . Arthritis   . Asthma     seasonal per pt  . GERD (gastroesophageal reflux disease)   . Diverticulosis 02/2012    seen on colonoscopy  . Urinary frequency   . Overactive bladder   . Urinary urgency   . Wears glasses   . Ringing of ears     Past Surgical History  Procedure Laterality Date  . Tonsillectomy    . Finger surgery Right   . Total knee arthroplasty  2012    left (Dr. Lorre Nick)  . Cholecystectomy    . Colonoscopy  02/2012    diverticulosis; Dr. Deatra Ina  . Total knee arthroplasty Right 06/19/2015    Procedure: TOTAL KNEE ARTHROPLASTY;  Surgeon: Vickey Huger, MD;  Location: [facility];  Service: Orthopedics;  Laterality: Right;    There were no vitals filed for this visit.  Visit Diagnosis:  Localized edema  Knee stiffness, right  Muscle weakness  Difficulty walking  Impaired functional mobility and activity tolerance  History of total  right knee replacement      Subjective Assessment - 07/24/15 1310    Subjective Pt states he has been running all weekend and states he is tired and ready to go to sleep.  Reports compliance with HEP doing them "when I can".  Reports a little pain in his Rt knee 2/10 (4/10 in Lt one)   Currently in Pain? Yes   Pain Score 2    Pain Location Knee   Pain Orientation Right   Pain Descriptors / Indicators Aching                         [facility] Adult PT Treatment/Exercise - 07/24/15 1312    Knee/Hip Exercises: Stretches   Active Hamstring Stretch 3 reps;30 seconds   Active Hamstring Stretch Limitations standing 8" box   Knee: Self-Stretch Limitations knee drives on 8in step [id number] 10"   Gastroc Stretch 3 reps;30 seconds   Gastroc Stretch Limitations slantboard    Knee/Hip Exercises: Standing   Forward Lunges Both;15 reps   Forward Lunges Limitations 4" box 1 HHA   Lateral Step Up Right;Hand Hold: 1;Step Height: 4";15 reps   Lateral Step Up Limitations therapist facilitation   Forward Step Up Both;15 reps;Step Height: 4";Hand Hold: 1  Forward Step Up Limitations 4"   Other Standing Knee Exercises hip abduction and extension with UE assist 10 reps each with therapist cues for form    Knee/Hip Exercises: Supine   Short Arc Quad Sets Right;15 reps   Short Arc Quad Sets Limitations 5" holds   Heel Slides 15 reps   Heel Slides Limitations 3" holds   Bridges Limitations 15x 3"   Straight Leg Raises 2 sets;10 reps;Right   Other Supine Knee/Hip Exercises AROM 2-115 degrees                  PT Short Term Goals - 07/10/15 1455    PT SHORT TERM GOAL #1   Title Patient will demonstrate R knee ROM of 0-120 degrees in order to reduce pain, enhance mobility, and improve overall level of function    Time 4   Period Weeks   Status New   PT SHORT TERM GOAL #2   Title Paitent will be independent in consistently and correctly performing at least 3 edema management  techniques, including but not limited to ice, retrograde massage, and stretching/exercises, in order to enhance independence in managing condition    Time 4   Period Weeks   Status New   PT SHORT TERM GOAL #3   Title Patient will demonstrate improved gait mechanics, including even step lengths and stance times, improved posture, improved gait speed, and minimal unsteadiness in order to enhance overall mobility and assist in returning to community    Time 4   Period Weeks   Status New   PT SHORT TERM GOAL #4   Title Patient to be independent in correctly and consistently perofrming appropriate HEP, to be updated PRN    Time 4   Period Weeks   Status New           PT Long Term Goals - 07/10/15 1509    PT LONG TERM GOAL #1   Title Patient to demonstrate strength at least 4+/5 in all tested muscle groups in order to reduce pain, enhance mobility, and assist in return to community based tasks    Time 8   Period Weeks   Status New   PT LONG TERM GOAL #2   Title Patient to report that he has been able to return to at least 20 minutes of swimming, at least 3 times per week, with pain R knee no more than 1/10 and minimal fatigue, after being cleared by MD to do so, in order to assist in improved functional actiivty tolerance and returning to PLOF    Time 8   Period Weeks   Status New   PT LONG TERM GOAL #3   Title Patient will be able to perform TUG in 9 seconds or less with no assistive device and will be able to ambulate at least 1247ft during 6MWT with no device or rest breaks in order to reduce fall risk and return to community based activities    Time 8   Period Weeks   Status New   PT LONG TERM GOAL #4   Title Patient to be able to reciprocally ascend and descend full flight of stiars with one railing and minimal unsteadiness, good eccentric control, in order to improve safe mobilty at home and in community    Time 8   Period Weeks   Status New               Plan -  07/24/15 1348    Clinical Impression  Statement Focused on increasing LE strength and stabiliy.  Pt continues to display shortness of breath, fatigue and overall malaise during treatment.  Urination frequent throughout session as well as mini breaks.  Progressed with hip extension and abduction to improve hip strength.  Increased reps and/or sets where able.  Encoruaged pt to increase cardio activity at home and complete therex every day.   Pt will benefit from skilled therapeutic intervention in order to improve on the following deficits Abnormal gait;Hypomobility;Increased edema;Decreased activity tolerance;Decreased strength;Pain;Decreased balance;Decreased mobility;Difficulty walking;Improper body mechanics;Decreased coordination;Postural dysfunction   Rehab Potential Good   Clinical Impairments Affecting Rehab Potential GERD, high urinary frequency, prediabetic    PT Frequency 3x / week   PT Duration 8 weeks   PT Treatment/Interventions ADLs/Self Care Home Management;Cryotherapy;Electrical Stimulation;DME Instruction;Gait training;Stair training;Functional mobility training;Therapeutic activities;Therapeutic exercise;Balance training;Neuromuscular re-education;Patient/family education;Manual techniques;Scar mobilization;Energy conservation;Taping   PT Next Visit Plan continue to progress strength, ROM and reduce swelling in Rt LE.  Begin gait training with increased clearance of LE's (pt tends to drag feet).  Add sit to stands and rockerboard.           Problem List Patient Active Problem List   Diagnosis Date Noted  . 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 Peripheral edema 08/31/2012  . Urinary frequency 08/31/2012  . Diverticulosis 03/23/2012  . Impaired fasting glucose 02/06/2012  . Decreased libido 03/07/2011  . Erectile dysfunction 03/07/2011  . Essential hypertension, benign 12/20/2010  . Mixed hyperlipidemia 12/20/2010  . ALLERGIC RHINITIS, SEASONAL 05/02/2010  . SCIATICA  05/02/2010  . HIATAL HERNIA, HX OF 05/02/2010    Teena Irani, PTA/CLT [phone]  07/24/2015, 1:56 PM  [place] [address] [place], [place], [address] Phone: [phone]   Fax:  [phone]  Name: Gary Carroll MRN: [id number] Date of Birth: 1946-01-09

## 2015-07-26 ENCOUNTER — Ambulatory Visit (HOSPITAL_COMMUNITY): Payer: No Typology Code available for payment source | Admitting: Physical Therapy

## 2015-07-26 DIAGNOSIS — R262 Difficulty in walking, not elsewhere classified: Secondary | ICD-10-CM

## 2015-07-26 DIAGNOSIS — Z7409 Other reduced mobility: Secondary | ICD-10-CM

## 2015-07-26 DIAGNOSIS — Z96651 Presence of right artificial knee joint: Secondary | ICD-10-CM

## 2015-07-26 DIAGNOSIS — M25661 Stiffness of right knee, not elsewhere classified: Secondary | ICD-10-CM

## 2015-07-26 DIAGNOSIS — R600 Localized edema: Secondary | ICD-10-CM

## 2015-07-26 DIAGNOSIS — M6281 Muscle weakness (generalized): Secondary | ICD-10-CM

## 2015-07-26 NOTE — Therapy (Signed)
[place], [place], [address] Phone: [phone]   Fax:  [phone]  Physical Therapy Treatment  Patient Details  Name: Gary Carroll MRN: [id number] Date of Birth: [birth year] Referring Provider: Dr. Augustin Coupe  Encounter Date: 07/26/2015      PT End of Session - 07/26/15 1016    Visit Number 8   Number of Visits 18   Date for PT Re-Evaluation 08/07/15   Authorization Type [employer] Medicare primary, [place] insurance secondary    Authorization Time Period 07/10/15 to 09/07/15   Authorization - Visit Number 8   Authorization - Number of Visits 10   PT Start Time [phone]  patient had to use restroom immediately at beginning of session   PT Stop Time 1014   PT Time Calculation (min) 37 min   Activity Tolerance Patient tolerated treatment well   Behavior During Therapy [facility] for tasks assessed/performed      Past Medical History  Diagnosis Date  . Bilateral knee pain     OA  . Prediabetes   . Allergic rhinitis   . Hiatal hernia   . Heart murmur   . HLD (hyperlipidemia)   . HTN (hypertension)   . Erectile dysfunction   . Arthritis   . Asthma     seasonal per pt  . GERD (gastroesophageal reflux disease)   . Diverticulosis 02/2012    seen on colonoscopy  . Urinary frequency   . Overactive bladder   . Urinary urgency   . Wears glasses   . Ringing of ears     Past Surgical History  Procedure Laterality Date  . Tonsillectomy    . Finger surgery Right   . Total knee arthroplasty  2012    left (Dr. Lorre Nick)  . Cholecystectomy    . Colonoscopy  02/2012    diverticulosis; Dr. Deatra Ina  . Total knee arthroplasty Right 06/19/2015    Procedure: TOTAL KNEE ARTHROPLASTY;  Surgeon: Vickey Huger, MD;  Location: [facility];  Service: Orthopedics;  Laterality: Right;    There were no vitals filed for this visit.  Visit Diagnosis:  Localized edema  Knee stiffness, right  Muscle weakness  Difficulty walking  Impaired  functional mobility and activity tolerance  History of total right knee replacement      Subjective Assessment - 07/26/15 0937    Subjective Patient reports he is tired this morning, cleaned his house all day  yesterday and isn't feelnig good today overall. Had to run into the bathroom first thing during session.    Pertinent History prediabetic, hx of hernia, GERD, poor bladder control   Currently in Pain? Yes   Pain Score 1    Pain Location Knee   Pain Orientation Left;Right                         [facility] Adult PT Treatment/Exercise - 07/26/15 0001    Knee/Hip Exercises: Stretches   Active Hamstring Stretch 30 seconds;2 reps   Active Hamstring Stretch Limitations standing 8" box   Knee: Self-Stretch Limitations knee drives on 8in step [id number] 10"   Gastroc Stretch 3 reps;30 seconds   Gastroc Stretch Limitations slantboard    Knee/Hip Exercises: Standing   Forward Lunges Both;15 reps   Forward Lunges Limitations 4" box 2 HHA   Forward Step Up Both;15 reps;Step Height: 4";Hand Hold: 1   Forward Step Up Limitations 4"   Rocker Board 2 minutes   Rocker  Board Limitations AP and lateral B HHA    Other Standing Knee Exercises 3D hip excursions 1x10   Manual Therapy   Manual Therapy Edema management;Joint mobilization   Manual therapy comments completed at end of session separately from therex and prior to ice application   Edema Management retro with LE elevated   Joint Mobilization 4 way patellar mobs                PT Education - 07/26/15 1016    Education provided Yes   Education Details importance of proper hydration    Person(s) Educated Patient   Methods Explanation   Comprehension Verbalized understanding          PT Short Term Goals - 07/10/15 1455    PT SHORT TERM GOAL #1   Title Patient will demonstrate R knee ROM of 0-120 degrees in order to reduce pain, enhance mobility, and improve overall level of function    Time 4   Period Weeks    Status New   PT SHORT TERM GOAL #2   Title Paitent will be independent in consistently and correctly performing at least 3 edema management techniques, including but not limited to ice, retrograde massage, and stretching/exercises, in order to enhance independence in managing condition    Time 4   Period Weeks   Status New   PT SHORT TERM GOAL #3   Title Patient will demonstrate improved gait mechanics, including even step lengths and stance times, improved posture, improved gait speed, and minimal unsteadiness in order to enhance overall mobility and assist in returning to community    Time 4   Period Weeks   Status New   PT SHORT TERM GOAL #4   Title Patient to be independent in correctly and consistently perofrming appropriate HEP, to be updated PRN    Time 4   Period Weeks   Status New           PT Long Term Goals - 07/10/15 1509    PT LONG TERM GOAL #1   Title Patient to demonstrate strength at least 4+/5 in all tested muscle groups in order to reduce pain, enhance mobility, and assist in return to community based tasks    Time 8   Period Weeks   Status New   PT LONG TERM GOAL #2   Title Patient to report that he has been able to return to at least 20 minutes of swimming, at least 3 times per week, with pain R knee no more than 1/10 and minimal fatigue, after being cleared by MD to do so, in order to assist in improved functional actiivty tolerance and returning to PLOF    Time 8   Period Weeks   Status New   PT LONG TERM GOAL #3   Title Patient will be able to perform TUG in 9 seconds or less with no assistive device and will be able to ambulate at least 129ft during 6MWT with no device or rest breaks in order to reduce fall risk and return to community based activities    Time 8   Period Weeks   Status New   PT LONG TERM GOAL #4   Title Patient to be able to reciprocally ascend and descend full flight of stiars with one railing and minimal unsteadiness, good eccentric  control, in order to improve safe mobilty at home and in community    Time 8   Period Weeks   Status New  Plan - 07/26/15 1017    Clinical Impression Statement Continued functional stretching and strengthening today and also introduced rocker board and 3D hip excursions today, with close supervision and cues for form. Patient did require general cues during session today for form and safety, also required multiple rest breaks during session due to fatigue and per patient "just not feeling good today". Reviewed safety and proper technique for stand to sit transfers as apteitn does demonstrate reduced safety awareness with this. Finished session with manual to surgical knee, performed separately from all other skilled interventions, via retrograde massage and patella mobilizations.    Pt will benefit from skilled therapeutic intervention in order to improve on the following deficits Abnormal gait;Hypomobility;Increased edema;Decreased activity tolerance;Decreased strength;Pain;Decreased balance;Decreased mobility;Difficulty walking;Improper body mechanics;Decreased coordination;Postural dysfunction   Rehab Potential Good   Clinical Impairments Affecting Rehab Potential GERD, high urinary frequency, prediabetic    PT Frequency 3x / week   PT Duration 8 weeks   PT Treatment/Interventions ADLs/Self Care Home Management;Cryotherapy;Electrical Stimulation;DME Instruction;Gait training;Stair training;Functional mobility training;Therapeutic activities;Therapeutic exercise;Balance training;Neuromuscular re-education;Patient/family education;Manual techniques;Scar mobilization;Energy conservation;Taping   PT Next Visit Plan continue to progress strength, ROM and reduce swelling in Rt LE.  Begin gait training with increased clearance of LE's (pt tends to drag feet).  Add sit to stands and rockerboard.      PT Home Exercise Plan HHPT HEP for now    Consulted and Agree with Plan of Care  Patient        Problem List Patient Active Problem List   Diagnosis Date Noted  . 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 Peripheral edema 08/31/2012  . Urinary frequency 08/31/2012  . Diverticulosis 03/23/2012  . Impaired fasting glucose 02/06/2012  . Decreased libido 03/07/2011  . Erectile dysfunction 03/07/2011  . Essential hypertension, benign 12/20/2010  . Mixed hyperlipidemia 12/20/2010  . ALLERGIC RHINITIS, SEASONAL 05/02/2010  . 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 HIATAL HERNIA, HX OF 05/02/2010    Deniece Ree PT, DPT [place] [address] [place], [place], [address] Phone: [phone]   Fax:  [phone]  Name: NEJI SEITZ MRN: [id number] Date of Birth: 1946/02/09

## 2015-07-28 ENCOUNTER — Ambulatory Visit (HOSPITAL_COMMUNITY): Payer: No Typology Code available for payment source | Admitting: Physical Therapy

## 2015-07-28 DIAGNOSIS — Z7409 Other reduced mobility: Secondary | ICD-10-CM

## 2015-07-28 DIAGNOSIS — M25661 Stiffness of right knee, not elsewhere classified: Secondary | ICD-10-CM

## 2015-07-28 DIAGNOSIS — R600 Localized edema: Secondary | ICD-10-CM

## 2015-07-28 DIAGNOSIS — R262 Difficulty in walking, not elsewhere classified: Secondary | ICD-10-CM

## 2015-07-28 DIAGNOSIS — Z96651 Presence of right artificial knee joint: Secondary | ICD-10-CM

## 2015-07-28 DIAGNOSIS — M6281 Muscle weakness (generalized): Secondary | ICD-10-CM

## 2015-07-28 NOTE — Therapy (Signed)
[place], [place], [address] Phone: [phone]   Fax:  [phone]  Physical Therapy Treatment  Patient Details  Name: Gary Carroll MRN: [id number] Date of Birth: 02-24-1946 Referring Provider: Dr. Augustin Coupe  Encounter Date: 07/28/2015      PT End of Session - 07/28/15 0952    Visit Number 9   Number of Visits 18   Date for PT Re-Evaluation 08/07/15   Authorization Type [employer] Medicare primary, [place] insurance secondary    Authorization Time Period 07/10/15 to 09/07/15   Authorization - Visit Number 9   Authorization - Number of Visits 10   PT Start Time 0933   PT Stop Time 1013   PT Time Calculation (min) 40 min   Equipment Utilized During Treatment Gait belt   Activity Tolerance Patient tolerated treatment well      Past Medical History  Diagnosis Date  . Bilateral knee pain     OA  . Prediabetes   . Allergic rhinitis   . Hiatal hernia   . Heart murmur   . HLD (hyperlipidemia)   . HTN (hypertension)   . Erectile dysfunction   . Arthritis   . Asthma     seasonal per pt  . GERD (gastroesophageal reflux disease)   . Diverticulosis 02/2012    seen on colonoscopy  . Urinary frequency   . Overactive bladder   . Urinary urgency   . Wears glasses   . Ringing of ears     Past Surgical History  Procedure Laterality Date  . Tonsillectomy    . Finger surgery Right   . Total knee arthroplasty  2012    left (Dr. Lorre Nick)  . Cholecystectomy    . Colonoscopy  02/2012    diverticulosis; Dr. Deatra Ina  . Total knee arthroplasty Right 06/19/2015    Procedure: TOTAL KNEE ARTHROPLASTY;  Surgeon: Vickey Huger, MD;  Location: [facility];  Service: Orthopedics;  Laterality: Right;    There were no vitals filed for this visit.  Visit Diagnosis:  Localized edema  Knee stiffness, right  Muscle weakness  Difficulty walking  Impaired functional mobility and activity tolerance  History of total right knee  replacement      Subjective Assessment - 07/28/15 0933    Subjective Pt states that he did not sleep well but it had nothing to do with his knee.  Pt has not been doing his exercises.    Pertinent History prediabetic, hx of hernia, GERD, poor bladder control   How long can you sit comfortably? has a lift chair, no limits    How long can you stand comfortably? 10 minutes    Patient Stated Goals get back to swimming, walk up and down stairs, driving    Currently in Pain? Yes   Pain Score 2    Pain Location Knee   Pain Orientation Right   Pain Descriptors / Indicators Aching   Pain Type Surgical pain                         [facility] Adult PT Treatment/Exercise - 07/28/15 0001    Knee/Hip Exercises: Stretches   Active Hamstring Stretch Right;3 reps;30 seconds   Active Hamstring Stretch Limitations supine   Knee: Self-Stretch Limitations knee drives on 8in step [id number] 10"  on 12" step   Gastroc Stretch 3 reps;30 seconds   Gastroc Stretch Limitations slantboard    Knee/Hip Exercises: Standing  Heel Raises Both;10 reps   Forward Lunges Right;Limitations   Lateral Step Up Right;15 reps;Step Height: 4"   Forward Step Up Right;15 reps;Step Height: 4"   Functional Squat 10 reps   Rocker Board 2 minutes   SLS x 5 both LE with one finger hold    Knee/Hip Exercises: Seated   Sit to [employer] 10 reps   Knee/Hip Exercises: Supine   Quad Sets 10 reps   Short Arc Quad Sets Strengthening;Right;10 reps;Limitations   Short Arc Quad Sets Limitations 5#    Heel Slides 10 reps   Knee Extension Limitations 4   Knee Flexion Limitations 117   Manual Therapy   Manual Therapy Edema management;Joint mobilization   Manual therapy comments completed seperate from exercises                 PT Education - 07/28/15 0952    Education provided Yes   Education Details to keep core tight during exercises    Person(s) Educated Patient   Methods Explanation   Comprehension Verbalized  understanding          PT Short Term Goals - 07/10/15 1455    PT SHORT TERM GOAL #1   Title Patient will demonstrate R knee ROM of 0-120 degrees in order to reduce pain, enhance mobility, and improve overall level of function    Time 4   Period Weeks   Status New   PT SHORT TERM GOAL #2   Title Paitent will be independent in consistently and correctly performing at least 3 edema management techniques, including but not limited to ice, retrograde massage, and stretching/exercises, in order to enhance independence in managing condition    Time 4   Period Weeks   Status New   PT SHORT TERM GOAL #3   Title Patient will demonstrate improved gait mechanics, including even step lengths and stance times, improved posture, improved gait speed, and minimal unsteadiness in order to enhance overall mobility and assist in returning to community    Time 4   Period Weeks   Status New   PT SHORT TERM GOAL #4   Title Patient to be independent in correctly and consistently perofrming appropriate HEP, to be updated PRN    Time 4   Period Weeks   Status New           PT Long Term Goals - 07/10/15 1509    PT LONG TERM GOAL #1   Title Patient to demonstrate strength at least 4+/5 in all tested muscle groups in order to reduce pain, enhance mobility, and assist in return to community based tasks    Time 8   Period Weeks   Status New   PT LONG TERM GOAL #2   Title Patient to report that he has been able to return to at least 20 minutes of swimming, at least 3 times per week, with pain R knee no more than 1/10 and minimal fatigue, after being cleared by MD to do so, in order to assist in improved functional actiivty tolerance and returning to PLOF    Time 8   Period Weeks   Status New   PT LONG TERM GOAL #3   Title Patient will be able to perform TUG in 9 seconds or less with no assistive device and will be able to ambulate at least 1296ft during 6MWT with no device or rest breaks in order to  reduce fall risk and return to community based activities    Time  8   Period Weeks   Status New   PT LONG TERM GOAL #4   Title Patient to be able to reciprocally ascend and descend full flight of stiars with one railing and minimal unsteadiness, good eccentric control, in order to improve safe mobilty at home and in community    Time [place] - 07/28/15 [id number]    Clinical Impression Statement Pt needed to use restroom to urinate three times  during treatment stated he was up 6-8 times last night with the same issue.  Therapist urged pt to go to his primary MD.  Added functional squats and sit to stand for increased strengtnening .   Added single leg stance for balance.  All exercisese facilitated by therapist to keep proper alighnment and core tight .  All manual completed seperately from exercises    Pt will benefit from skilled therapeutic intervention in order to improve on the following deficits Abnormal gait;Hypomobility;Increased edema;Decreased activity tolerance;Decreased strength;Pain;Decreased balance;Decreased mobility;Difficulty walking;Improper body mechanics;Decreased coordination;Postural dysfunction   Clinical Impairments Affecting Rehab Potential GERD, high urinary frequency, prediabetic    PT Frequency 3x / week   PT Duration 8 weeks   PT Next Visit Plan Reassess next treatment.  Continue to work on balance, ROM and edema control    Consulted and Agree with Plan of Care Patient        Problem List Patient Active Problem List   Diagnosis Date Noted  . 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 Peripheral edema 08/31/2012  . Urinary frequency 08/31/2012  . Diverticulosis 03/23/2012  . Impaired fasting glucose 02/06/2012  . Decreased libido 03/07/2011  . Erectile dysfunction 03/07/2011  . Essential hypertension, benign 12/20/2010  . Mixed hyperlipidemia 12/20/2010  . ALLERGIC RHINITIS, SEASONAL 05/02/2010  . SCIATICA  05/02/2010  . HIATAL HERNIA, HX OF 05/02/2010    Rayetta Humphrey, PT CLT [phone] 07/28/2015, 10:17 AM  [facility] [address] [place], [place], [address] Phone: [phone]   Fax:  [phone]  Name: ARWIN HAAG MRN: [id number] Date of Birth: 1945-11-18

## 2015-07-31 ENCOUNTER — Ambulatory Visit (HOSPITAL_COMMUNITY): Payer: No Typology Code available for payment source | Admitting: Physical Therapy

## 2015-07-31 DIAGNOSIS — Z7409 Other reduced mobility: Secondary | ICD-10-CM

## 2015-07-31 DIAGNOSIS — R262 Difficulty in walking, not elsewhere classified: Secondary | ICD-10-CM

## 2015-07-31 DIAGNOSIS — M25661 Stiffness of right knee, not elsewhere classified: Secondary | ICD-10-CM

## 2015-07-31 DIAGNOSIS — Z96651 Presence of right artificial knee joint: Secondary | ICD-10-CM

## 2015-07-31 DIAGNOSIS — M6281 Muscle weakness (generalized): Secondary | ICD-10-CM

## 2015-07-31 DIAGNOSIS — R600 Localized edema: Secondary | ICD-10-CM

## 2015-07-31 NOTE — Therapy (Signed)
[place], [place], [address] Phone: [phone]   Fax:  [phone]  Physical Therapy Treatment/Re-certification  Patient Details  Name: Gary Carroll MRN: [id number] Date of Birth: [birth year] Referring Provider: Dr. Augustin Coupe  Encounter Date: 07/31/2015      PT End of Session - 07/31/15 1621    Visit Number 10   Number of Visits 18   Date for PT Re-Evaluation 09/25/15   Authorization Type [employer] Medicare primary, [employer] insurance secondary    Authorization Time Period 0000000 to 99991111 (re-cert sent [id number])   Authorization - Visit Number 10   Authorization - Number of Visits 20   PT Start Time 1430   PT Stop Time 1514   PT Time Calculation (min) 44 min   Activity Tolerance Patient tolerated treatment well   Behavior During Therapy [facility] for tasks assessed/performed      Past Medical History  Diagnosis Date  . Bilateral knee pain     OA  . Prediabetes   . Allergic rhinitis   . Hiatal hernia   . Heart murmur   . HLD (hyperlipidemia)   . HTN (hypertension)   . Erectile dysfunction   . Arthritis   . Asthma     seasonal per pt  . GERD (gastroesophageal reflux disease)   . Diverticulosis 02/2012    seen on colonoscopy  . Urinary frequency   . Overactive bladder   . Urinary urgency   . Wears glasses   . Ringing of ears     Past Surgical History  Procedure Laterality Date  . Tonsillectomy    . Finger surgery Right   . Total knee arthroplasty  2012    left (Dr. Lorre Nick)  . Cholecystectomy    . Colonoscopy  02/2012    diverticulosis; Dr. Deatra Ina  . Total knee arthroplasty Right 06/19/2015    Procedure: TOTAL KNEE ARTHROPLASTY;  Surgeon: Vickey Huger, MD;  Location: [place];  Service: Orthopedics;  Laterality: Right;    There were no vitals filed for this visit.  Visit Diagnosis:  Localized edema - Plan: PT plan of care cert/re-cert  Knee stiffness, right - Plan: PT plan of care  cert/re-cert  Muscle weakness - Plan: PT plan of care cert/re-cert  Difficulty walking - Plan: PT plan of care cert/re-cert  Impaired functional mobility and activity tolerance - Plan: PT plan of care cert/re-cert  History of total right knee replacement - Plan: PT plan of care cert/re-cert      Subjective Assessment - 07/31/15 1438    Subjective Had a rough night last night but doing a little better today. Walking seems to be going better, not all the way but getting better. Going to see the doctor tomorrow.    How long can you sit comfortably? unlimited   How long can you stand comfortably? 20 min   How long can you walk comfortably? 20-30 minutes   Patient Stated Goals get back to swimming   Currently in Pain? No/denies   Aggravating Factors  nothing really   Pain Relieving Factors rest, advil   Effect of Pain on Daily Activities getting close to being back to himself.             [facility] PT Assessment - 07/31/15 0001    Observation/Other Assessments   Focus on Therapeutic Outcomes (FOTO)  46% limtation   Strength   Right Knee Flexion 4+/5   Right Knee Extension 4+/5   6 minute walk  test results    Aerobic Endurance Distance Walked 778   Endurance additional comments 64min, 5 sec max amb time.                      [facility] Adult PT Treatment/Exercise - 07/31/15 0001    Ambulation/Gait   Ambulation/Gait Yes   Ambulation Distance (Feet) 778 Feet   Assistive device None   Stairs Yes   Stairs Assistance 6: Modified independent (Device/Increase time)   Stair Management Technique Two rails   Number of Stairs 5   Gait Comments Not using an assistive device but reaching for objects in clinic for stability including counters, walls and tables. Poor foot clearance bilaterally, even stride lengths. Stairs: using alternating and step-to pattern.    Knee/Hip Exercises: Standing   Forward Lunges Right;Limitations   Forward Lunges Limitations 12 inch box, bilateral UE  support.    Knee/Hip Exercises: Seated   Long Arc Quad Strengthening;Right;1 set;10 reps   Heel Slides AROM;Right;1 set;10 reps   Knee/Hip Exercises: Supine   Quad Sets 10 reps   Short Arc Quad Sets Strengthening;Right;10 reps;Limitations   Short Arc Quad Sets Limitations 5# 2X10   Heel Slides 10 reps   Knee Extension Limitations 0   Knee Flexion Limitations 115                PT Education - 07/31/15 1620    Education provided Yes   Education Details discussing focus on Rt knee during activities to avoid compensations   Person(s) Educated Patient   Methods Explanation;Demonstration;Verbal cues;Tactile cues   Comprehension Verbalized understanding;Returned demonstration;Need further instruction          PT Short Term Goals - 07/31/15 1454    PT SHORT TERM GOAL #1   Title Patient will demonstrate R knee ROM of 0-120 degrees in order to reduce pain, enhance mobility, and improve overall level of function    Baseline 0-115   Time 4   Period Weeks   Status On-going   PT SHORT TERM GOAL #2   Title Paitent will be independent in consistently and correctly performing at least 3 edema management techniques, including but not limited to ice, retrograde massage, and stretching/exercises, in order to enhance independence in managing condition    Baseline Pt reports using exercises, elevation and ice occasionally.    Status Achieved   PT SHORT TERM GOAL #3   Title Patient will demonstrate improved gait mechanics, including even step lengths and stance times, improved posture, improved gait speed, and minimal unsteadiness in order to enhance overall mobility and assist in returning to community    Baseline ambulating without an assistive device but reaching for walls and objects for increased stability    Time 4   Period Weeks   Status On-going   PT SHORT TERM GOAL #4   Title Patient to be independent in correctly and consistently perofrming appropriate HEP, to be updated PRN     Baseline continue to encourage consistency   Status Achieved           PT Long Term Goals - 07/31/15 1458    PT LONG TERM GOAL #1   Title Patient to demonstrate strength at least 4+/5 in all tested muscle groups in order to reduce pain, enhance mobility, and assist in return to community based tasks    Baseline knee flexion and extension 4+/5   Status Achieved   PT LONG TERM GOAL #2   Title Patient to report that he  has been able to return to at least 20 minutes of swimming, at least 3 times per week, with pain R knee no more than 1/10 and minimal fatigue, after being cleared by MD to do so, in order to assist in improved functional actiivty tolerance and returning to PLOF    Baseline not released to swimming yet   Time 8   Period Weeks   Status On-going   PT LONG TERM GOAL #3   Title Patient will be able to perform TUG in 9 seconds or less with no assistive device and will be able to ambulate at least 1231ft during 6MWT with no device or rest breaks in order to reduce fall risk and return to community based activities    Baseline Able to ambulate 57min 5seconds (778 ft) before requesting a seated rest.    Time 8   Status On-going   PT LONG TERM GOAL #4   Title Patient to be able to reciprocally ascend and descend full flight of stiars with one railing and minimal unsteadiness, good eccentric control, in order to improve safe mobilty at home and in community    Baseline unable to consistently use reciprocal pattern when asending steps (4 6inch)   Time 8   Period Weeks   Status On-going               Plan - 08-30-15 1624    Clinical Impression Statement Patient has continued to make progress with PT sessions over the last 10 PT visits. The patient has progresses with his ROM which is now 0-115 degrees. Functionally his FOTO score has improved from 53% limitation to no 46% limitation. Overall the patient is making steady progress with PT. The patient does still have decreased  activity tolerance and stability wtih ambulation. He was unable to ambulate 6 minutes and demonstrated decreased stability. He was able to ambulate 778 feet in 37min 5 sec. The patient will continue to benefit from continued skilled PT session to continue working on ROM, strength, balance and gait.    Pt will benefit from skilled therapeutic intervention in order to improve on the following deficits Abnormal gait;Hypomobility;Increased edema;Decreased activity tolerance;Decreased strength;Pain;Decreased balance;Decreased mobility;Difficulty walking;Improper body mechanics;Decreased coordination;Postural dysfunction;Decreased endurance   Rehab Potential Good   Clinical Impairments Affecting Rehab Potential GERD, high urinary frequency, prediabetic    PT Frequency 3x / week   PT Duration 8 weeks   PT Treatment/Interventions ADLs/Self Care Home Management;Cryotherapy;Electrical Stimulation;DME Instruction;Gait training;Stair training;Functional mobility training;Therapeutic activities;Therapeutic exercise;Balance training;Neuromuscular re-education;Patient/family education;Manual techniques;Scar mobilization;Energy conservation;Taping   PT Next Visit Plan Continue to work on balance, ROM and edema control    PT Home Exercise Plan continue to encourage consistency with HEP.    Consulted and Agree with Plan of Care Patient          G-Codes - 2015-08-30 1629    Functional Assessment Tool Used Per FOTO and clinical judgment   Functional Limitation Mobility: Walking and moving around   Mobility: Walking and Moving Around Current Status [phone]) At least 40 percent but less than 60 percent impaired, limited or restricted   Mobility: Walking and Moving Around Goal Status [phone]) At least 20 percent but less than 40 percent impaired, limited or restricted      Problem List Patient Active Problem List   Diagnosis Date Noted  . 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 Peripheral edema 08/31/2012  .  Urinary frequency 08/31/2012  . Diverticulosis 03/23/2012  . Impaired fasting glucose 02/06/2012  . Decreased  libido 03/07/2011  . Erectile dysfunction 03/07/2011  . Essential hypertension, benign 12/20/2010  . Mixed hyperlipidemia 12/20/2010  . ALLERGIC RHINITIS, SEASONAL 05/02/2010  . 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 HIATAL HERNIA, HX OF 05/02/2010    Cassell Clement, PT, CSCS Pager [phone]  07/31/2015, 4:36 PM  [place] [address] [place], [place], [address] Phone: [phone]   Fax:  [phone]  Name: Gary Carroll MRN: [id number] Date of Birth: 19-Aug-1945

## 2015-08-02 ENCOUNTER — Ambulatory Visit (HOSPITAL_COMMUNITY): Payer: Commercial Managed Care - HMO | Attending: Orthopedic Surgery | Admitting: Physical Therapy

## 2015-08-02 DIAGNOSIS — R600 Localized edema: Secondary | ICD-10-CM

## 2015-08-02 DIAGNOSIS — M6281 Muscle weakness (generalized): Secondary | ICD-10-CM

## 2015-08-02 DIAGNOSIS — M25661 Stiffness of right knee, not elsewhere classified: Secondary | ICD-10-CM

## 2015-08-02 DIAGNOSIS — R262 Difficulty in walking, not elsewhere classified: Secondary | ICD-10-CM

## 2015-08-02 DIAGNOSIS — Z96651 Presence of right artificial knee joint: Secondary | ICD-10-CM

## 2015-08-02 DIAGNOSIS — R609 Edema, unspecified: Secondary | ICD-10-CM | POA: Insufficient documentation

## 2015-08-02 DIAGNOSIS — Z7409 Other reduced mobility: Secondary | ICD-10-CM

## 2015-08-02 NOTE — Therapy (Signed)
[place], [place], [address] Phone: [phone]   Fax:  [phone]  Physical Therapy Treatment  Patient Details  Name: Gary Carroll MRN: [id number] Date of Birth: 1945/09/26 Referring Provider: Dr. Augustin Coupe  Encounter Date: 08/02/2015      PT End of Session - 08/02/15 1531    Visit Number 11   Number of Visits 18   Date for PT Re-Evaluation 09/25/15   Authorization Type [employer] Medicare primary, [employer] insurance secondary    Authorization Time Period 0000000 to 99991111 (re-cert sent 99991111)   Authorization - Visit Number 11   Authorization - Number of Visits 20   PT Start Time [id number]  started on time but took out time due to frequent bathroom breaks during session    PT Stop Time 1515   PT Time Calculation (min) 38 min   Activity Tolerance Patient tolerated treatment well   Behavior During Therapy [facility] for tasks assessed/performed      Past Medical History  Diagnosis Date  . Bilateral knee pain     OA  . Prediabetes   . Allergic rhinitis   . Hiatal hernia   . Heart murmur   . HLD (hyperlipidemia)   . HTN (hypertension)   . Erectile dysfunction   . Arthritis   . Asthma     seasonal per pt  . GERD (gastroesophageal reflux disease)   . Diverticulosis 02/2012    seen on colonoscopy  . Urinary frequency   . Overactive bladder   . Urinary urgency   . Wears glasses   . Ringing of ears     Past Surgical History  Procedure Laterality Date  . Tonsillectomy    . Finger surgery Right   . Total knee arthroplasty  2012    left (Dr. Lorre Nick)  . Cholecystectomy    . Colonoscopy  02/2012    diverticulosis; Dr. Deatra Ina  . Total knee arthroplasty Right 06/19/2015    Procedure: TOTAL KNEE ARTHROPLASTY;  Surgeon: Vickey Huger, MD;  Location: [place];  Service: Orthopedics;  Laterality: Right;    There were no vitals filed for this visit.  Visit Diagnosis:  Localized edema  Knee stiffness, right  Muscle  weakness  Difficulty walking  Impaired functional mobility and activity tolerance  History of total right knee replacement      Subjective Assessment - 08/02/15 1435    Subjective Patient went swimming this morning, about 30 minutes, and reports he is tired today; states he isn't feeling that good today. Went to MD the other day, who told him he was doing good and that he could swim.    Pertinent History prediabetic, hx of hernia, GERD, poor bladder control   Patient Stated Goals get back to swimming   Currently in Pain? Yes   Pain Score 3    Pain Location Knee   Pain Orientation Left   Pain Descriptors / Indicators Aching   Pain Type Chronic pain   Pain Radiating Towards no radicular symptoms    Pain Onset 1 to 4 weeks ago   Pain Frequency Intermittent   Aggravating Factors  nothing really    Pain Relieving Factors nothing   Multiple Pain Sites No                         [facility] Adult PT Treatment/Exercise - 08/02/15 0001    Knee/Hip Exercises: Stretches   Active Hamstring Stretch Right;3 reps;30 seconds  Active Hamstring Stretch Limitations standing 12 inch box    Knee: Self-Stretch Limitations knee drives on 8in step [id number]"   Gastroc Stretch 3 reps;30 seconds   Gastroc Stretch Limitations slantboard    Knee/Hip Exercises: Standing   Heel Raises Both;10 reps   Heel Raises Limitations heel and toe    Forward Step Up Both;1 set;10 reps   Forward Step Up Limitations 4 inch box; lowered reps due to time    Rocker Board 2 minutes   Rocker Board Limitations AP and lateral U HHA    Knee/Hip Exercises: Seated   Other Seated Knee/Hip Exercises eccentric sit to stands 1x10    Knee/Hip Exercises: Supine   Quad Sets 20 reps   Short Arc Quad Sets Strengthening;Right;10 reps;Limitations   Short Arc Quad Sets Limitations 5# 2x15                 PT Education - 08/02/15 1531    Education provided Yes   Education Details encouraged patient to discuss  shortness of breath and fatigue with MD at his appointment monday   Person(s) Educated Patient   Methods Explanation   Comprehension Verbalized understanding          PT Short Term Goals - 07/31/15 1454    PT SHORT TERM GOAL #1   Title Patient will demonstrate R knee ROM of 0-120 degrees in order to reduce pain, enhance mobility, and improve overall level of function    Baseline 0-115   Time 4   Period Weeks   Status On-going   PT SHORT TERM GOAL #2   Title Paitent will be independent in consistently and correctly performing at least 3 edema management techniques, including but not limited to ice, retrograde massage, and stretching/exercises, in order to enhance independence in managing condition    Baseline Pt reports using exercises, elevation and ice occasionally.    Status Achieved   PT SHORT TERM GOAL #3   Title Patient will demonstrate improved gait mechanics, including even step lengths and stance times, improved posture, improved gait speed, and minimal unsteadiness in order to enhance overall mobility and assist in returning to community    Baseline ambulating without an assistive device but reaching for walls and objects for increased stability    Time 4   Period Weeks   Status On-going   PT SHORT TERM GOAL #4   Title Patient to be independent in correctly and consistently perofrming appropriate HEP, to be updated PRN    Baseline continue to encourage consistency   Status Achieved           PT Long Term Goals - 07/31/15 1458    PT LONG TERM GOAL #1   Title Patient to demonstrate strength at least 4+/5 in all tested muscle groups in order to reduce pain, enhance mobility, and assist in return to community based tasks    Baseline knee flexion and extension 4+/5   Status Achieved   PT LONG TERM GOAL #2   Title Patient to report that he has been able to return to at least 20 minutes of swimming, at least 3 times per week, with pain R knee no more than 1/10 and minimal  fatigue, after being cleared by MD to do so, in order to assist in improved functional actiivty tolerance and returning to PLOF    Baseline not released to swimming yet   Time 8   Period Weeks   Status On-going   PT LONG TERM GOAL #3  Title Patient will be able to perform TUG in 9 seconds or less with no assistive device and will be able to ambulate at least 1219ft during 6MWT with no device or rest breaks in order to reduce fall risk and return to community based activities    Baseline Able to ambulate 53min 5seconds (778 ft) before requesting a seated rest.    Time 8   Status On-going   PT LONG TERM GOAL #4   Title Patient to be able to reciprocally ascend and descend full flight of stiars with one railing and minimal unsteadiness, good eccentric control, in order to improve safe mobilty at home and in community    Baseline unable to consistently use reciprocal pattern when asending steps (4 6inch)   Time 8   Period Weeks   Status On-going               Plan - 08/02/15 1534    Clinical Impression Statement Continued to address functional strength and knee mobility today during skilled session; had to provided frequent breaks for rest room use due to urinary urgency. Noted that patient continues to remain short of breath and is very easily fatigued during sessions, patiient reports that he is going to see a doctor about this on Monday and was strongly encouraged to do so. He reports that he was able to swim this morning for about 30 minutes with no problems. With functional exercise, note that when patient is asked to remove UE support he has a much more difficult time with functional exercises, not even able to complete full heel raise in standing with good form withotu UE support, indicating that tpatient likely compensates during exercise.   Pt will benefit from skilled therapeutic intervention in order to improve on the following deficits Abnormal gait;Hypomobility;Increased  edema;Decreased activity tolerance;Decreased strength;Pain;Decreased balance;Decreased mobility;Difficulty walking;Improper body mechanics;Decreased coordination;Postural dysfunction;Decreased endurance   Rehab Potential Good   Clinical Impairments Affecting Rehab Potential GERD, high urinary frequency, prediabetic    PT Frequency 3x / week   PT Duration 8 weeks   PT Treatment/Interventions ADLs/Self Care Home Management;Cryotherapy;Electrical Stimulation;DME Instruction;Gait training;Stair training;Functional mobility training;Therapeutic activities;Therapeutic exercise;Balance training;Neuromuscular re-education;Patient/family education;Manual techniques;Scar mobilization;Energy conservation;Taping   PT Next Visit Plan Continue to work on balance, ROM and edema control    PT Home Exercise Plan continue to encourage consistency with HEP.    Consulted and Agree with Plan of Care Patient        Problem List Patient Active Problem List   Diagnosis Date Noted  . 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 Peripheral edema 08/31/2012  . Urinary frequency 08/31/2012  . Diverticulosis 03/23/2012  . Impaired fasting glucose 02/06/2012  . Decreased libido 03/07/2011  . Erectile dysfunction 03/07/2011  . Essential hypertension, benign 12/20/2010  . Mixed hyperlipidemia 12/20/2010  . ALLERGIC RHINITIS, SEASONAL 05/02/2010  . 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 HIATAL HERNIA, HX OF 05/02/2010    Deniece Ree PT, DPT [facility] [address] [place], [place], [address] Phone: [phone]   Fax:  [phone]  Name: YASUO PAFFORD MRN: [id number] Date of Birth: 09/22/1945

## 2015-08-04 ENCOUNTER — Ambulatory Visit (HOSPITAL_COMMUNITY): Payer: No Typology Code available for payment source

## 2015-08-07 ENCOUNTER — Ambulatory Visit (HOSPITAL_COMMUNITY): Payer: No Typology Code available for payment source | Admitting: Physical Therapy

## 2015-08-07 DIAGNOSIS — R600 Localized edema: Secondary | ICD-10-CM

## 2015-08-07 DIAGNOSIS — Z96651 Presence of right artificial knee joint: Secondary | ICD-10-CM

## 2015-08-07 DIAGNOSIS — M25661 Stiffness of right knee, not elsewhere classified: Secondary | ICD-10-CM

## 2015-08-07 DIAGNOSIS — M6281 Muscle weakness (generalized): Secondary | ICD-10-CM

## 2015-08-07 DIAGNOSIS — Z7409 Other reduced mobility: Secondary | ICD-10-CM

## 2015-08-07 DIAGNOSIS — R262 Difficulty in walking, not elsewhere classified: Secondary | ICD-10-CM

## 2015-08-07 NOTE — Therapy (Signed)
[place], [place], [address] Phone: [phone]   Fax:  [phone]  Physical Therapy Treatment  Patient Details  Name: Gary Carroll MRN: [id number] Date of Birth: 1945-08-11 Referring Provider: Dr. Augustin Coupe  Encounter Date: 08/07/2015      PT End of Session - 08/07/15 1521    Visit Number 12   Number of Visits 18   Date for PT Re-Evaluation 09/25/15   Authorization Type [employer] Medicare primary, [place] insurance secondary    Authorization Time Period 0000000 to 99991111 (re-cert sent 99991111)   Authorization - Visit Number 12   Authorization - Number of Visits 20   PT Start Time [id number]  started at 1434, took out a few minutes due to restroom use    PT Stop Time 1515   PT Time Calculation (min) 38 min   Activity Tolerance Patient tolerated treatment well   Behavior During Therapy [facility] for tasks assessed/performed      Past Medical History  Diagnosis Date  . Bilateral knee pain     OA  . Prediabetes   . Allergic rhinitis   . Hiatal hernia   . Heart murmur   . HLD (hyperlipidemia)   . HTN (hypertension)   . Erectile dysfunction   . Arthritis   . Asthma     seasonal per pt  . GERD (gastroesophageal reflux disease)   . Diverticulosis 02/2012    seen on colonoscopy  . Urinary frequency   . Overactive bladder   . Urinary urgency   . Wears glasses   . Ringing of ears     Past Surgical History  Procedure Laterality Date  . Tonsillectomy    . Finger surgery Right   . Total knee arthroplasty  2012    left (Dr. Lorre Nick)  . Cholecystectomy    . Colonoscopy  02/2012    diverticulosis; Dr. Deatra Ina  . Total knee arthroplasty Right 06/19/2015    Procedure: TOTAL KNEE ARTHROPLASTY;  Surgeon: Vickey Huger, MD;  Location: [place];  Service: Orthopedics;  Laterality: Right;    There were no vitals filed for this visit.  Visit Diagnosis:  Localized edema  Knee stiffness, right  Muscle  weakness  Difficulty walking  Impaired functional mobility and activity tolerance  History of total right knee replacement      Subjective Assessment - 08/07/15 1437    Subjective Patietn reports he went to the MD last week. his primary MD thinks that his SOB and fatigue are due to low hemoglobin and has given him medication to try to address this. Patient is just tired today, very fatigued in general.    Patient Stated Goals get back to swimming   Currently in Pain? Yes   Pain Score 4    Pain Location Knee   Pain Orientation Left                         [facility] Adult PT Treatment/Exercise - 08/07/15 0001    Knee/Hip Exercises: Stretches   Active Hamstring Stretch Right;3 reps;30 seconds   Active Hamstring Stretch Limitations standing 12 inch box    Knee: Self-Stretch Limitations knee drives on 8in step [id number]"   Gastroc Stretch 3 reps;30 seconds   Gastroc Stretch Limitations slantboard    Knee/Hip Exercises: Standing   Heel Raises Both;10 reps   Heel Raises Limitations heel and toe    Forward Step Up Both;1 set;10 reps  Forward Step Up Limitations 4 inch box   Rocker Board 2 minutes   Rocker Board Limitations AP and lateral U HHA    Other Standing Knee Exercises eccentric sit to stands 1x5, 3 second lowers                 PT Education - 08/07/15 1521    Education provided No          PT Short Term Goals - 07/31/15 1454    PT SHORT TERM GOAL #1   Title Patient will demonstrate R knee ROM of 0-120 degrees in order to reduce pain, enhance mobility, and improve overall level of function    Baseline 0-115   Time 4   Period Weeks   Status On-going   PT SHORT TERM GOAL #2   Title Paitent will be independent in consistently and correctly performing at least 3 edema management techniques, including but not limited to ice, retrograde massage, and stretching/exercises, in order to enhance independence in managing condition    Baseline Pt reports  using exercises, elevation and ice occasionally.    Status Achieved   PT SHORT TERM GOAL #3   Title Patient will demonstrate improved gait mechanics, including even step lengths and stance times, improved posture, improved gait speed, and minimal unsteadiness in order to enhance overall mobility and assist in returning to community    Baseline ambulating without an assistive device but reaching for walls and objects for increased stability    Time 4   Period Weeks   Status On-going   PT SHORT TERM GOAL #4   Title Patient to be independent in correctly and consistently perofrming appropriate HEP, to be updated PRN    Baseline continue to encourage consistency   Status Achieved           PT Long Term Goals - 07/31/15 1458    PT LONG TERM GOAL #1   Title Patient to demonstrate strength at least 4+/5 in all tested muscle groups in order to reduce pain, enhance mobility, and assist in return to community based tasks    Baseline knee flexion and extension 4+/5   Status Achieved   PT LONG TERM GOAL #2   Title Patient to report that he has been able to return to at least 20 minutes of swimming, at least 3 times per week, with pain R knee no more than 1/10 and minimal fatigue, after being cleared by MD to do so, in order to assist in improved functional actiivty tolerance and returning to PLOF    Baseline not released to swimming yet   Time 8   Period Weeks   Status On-going   PT LONG TERM GOAL #3   Title Patient will be able to perform TUG in 9 seconds or less with no assistive device and will be able to ambulate at least 1263ft during 6MWT with no device or rest breaks in order to reduce fall risk and return to community based activities    Baseline Able to ambulate 67min 5seconds (778 ft) before requesting a seated rest.    Time 8   Status On-going   PT LONG TERM GOAL #4   Title Patient to be able to reciprocally ascend and descend full flight of stiars with one railing and minimal  unsteadiness, good eccentric control, in order to improve safe mobilty at home and in community    Baseline unable to consistently use reciprocal pattern when asending steps (4 6inch)   Time 8  Period Weeks   Status On-going               Plan - 08/07/15 1452    Clinical Impression Statement Patient arrived reporting that his MD believes his shortness of breath and general fatigue are both due to low hemoglobin, and that his MD has given him some medicaion/supplements to help address this; however patient continues to report that he is still very tired and continues to also present with general fatigue, requiring frequent rest and restroom breaks throughout PT session, limiting extent of exercises today. Continued with functional stretching today and in general focused on standing exercise today with rest breaks provided as needed by patient. Patient overall very talkative throughout session and requried frequent cues for form and to maintain on task throughout session. Recommend trialing stairs and potentially low intensity exercise to tolerance on bike next session.  Note that although patient reports that he performs some exercises at the gym with weight, such as heel raises, he has quite  a bit of difficulty in performing this exercise through functional range unweighted against gravity with no UE support.    Pt will benefit from skilled therapeutic intervention in order to improve on the following deficits Abnormal gait;Hypomobility;Increased edema;Decreased activity tolerance;Decreased strength;Pain;Decreased balance;Decreased mobility;Difficulty walking;Improper body mechanics;Decreased coordination;Postural dysfunction;Decreased endurance   Rehab Potential Good   Clinical Impairments Affecting Rehab Potential GERD, high urinary frequency, prediabetic    PT Frequency 3x / week   PT Duration 8 weeks   PT Treatment/Interventions ADLs/Self Care Home Management;Cryotherapy;Electrical  Stimulation;DME Instruction;Gait training;Stair training;Functional mobility training;Therapeutic activities;Therapeutic exercise;Balance training;Neuromuscular re-education;Patient/family education;Manual techniques;Scar mobilization;Energy conservation;Taping   PT Next Visit Plan Continue to work on balance, ROM and edema control; trial stairs and bike at low intensity next session    PT Home Exercise Plan continue to encourage consistency with HEP.    Consulted and Agree with Plan of Care Patient        Problem List Patient Active Problem List   Diagnosis Date Noted  . 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 Peripheral edema 08/31/2012  . Urinary frequency 08/31/2012  . Diverticulosis 03/23/2012  . Impaired fasting glucose 02/06/2012  . Decreased libido 03/07/2011  . Erectile dysfunction 03/07/2011  . Essential hypertension, benign 12/20/2010  . Mixed hyperlipidemia 12/20/2010  . ALLERGIC RHINITIS, SEASONAL 05/02/2010  . 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 HIATAL HERNIA, HX OF 05/02/2010    Deniece Ree PT, DPT [place] [address] [place], [place], [address] Phone: [phone]   Fax:  [phone]  Name: KRISCHAN DILLAHUNT MRN: [id number] Date of Birth: [birth year]

## 2015-08-09 ENCOUNTER — Ambulatory Visit (HOSPITAL_COMMUNITY): Payer: No Typology Code available for payment source | Admitting: Physical Therapy

## 2015-08-09 DIAGNOSIS — Z96651 Presence of right artificial knee joint: Secondary | ICD-10-CM

## 2015-08-09 DIAGNOSIS — M25661 Stiffness of right knee, not elsewhere classified: Secondary | ICD-10-CM

## 2015-08-09 DIAGNOSIS — M6281 Muscle weakness (generalized): Secondary | ICD-10-CM

## 2015-08-09 DIAGNOSIS — Z7409 Other reduced mobility: Secondary | ICD-10-CM

## 2015-08-09 DIAGNOSIS — R262 Difficulty in walking, not elsewhere classified: Secondary | ICD-10-CM

## 2015-08-09 DIAGNOSIS — R600 Localized edema: Secondary | ICD-10-CM | POA: Diagnosis not present

## 2015-08-09 NOTE — Therapy (Signed)
[place], [place], [address] Phone: [phone]   Fax:  [phone]  Physical Therapy Treatment  Patient Details  Name: Gary Carroll MRN: [id number] Date of Birth: 11-27-1945 Referring Provider: Dr. Augustin Coupe  Encounter Date: 08/09/2015      PT End of Session - 08/09/15 1342    Visit Number 13   Number of Visits 18   Date for PT Re-Evaluation 09/25/15   Authorization Type [employer] Medicare primary, [place] insurance secondary    Authorization Time Period 0000000 to 99991111 (re-cert sent 99991111)   Authorization - Visit Number 13   Authorization - Number of Visits 20   PT Start Time 1300   PT Stop Time 1339   PT Time Calculation (min) 39 min   Activity Tolerance Patient tolerated treatment well   Behavior During Therapy [facility] for tasks assessed/performed      Past Medical History  Diagnosis Date  . Bilateral knee pain     OA  . Prediabetes   . Allergic rhinitis   . Hiatal hernia   . Heart murmur   . HLD (hyperlipidemia)   . HTN (hypertension)   . Erectile dysfunction   . Arthritis   . Asthma     seasonal per pt  . GERD (gastroesophageal reflux disease)   . Diverticulosis 02/2012    seen on colonoscopy  . Urinary frequency   . Overactive bladder   . Urinary urgency   . Wears glasses   . Ringing of ears     Past Surgical History  Procedure Laterality Date  . Tonsillectomy    . Finger surgery Right   . Total knee arthroplasty  2012    left (Dr. Lorre Nick)  . Cholecystectomy    . Colonoscopy  02/2012    diverticulosis; Dr. Deatra Ina  . Total knee arthroplasty Right 06/19/2015    Procedure: TOTAL KNEE ARTHROPLASTY;  Surgeon: Vickey Huger, MD;  Location: [place];  Service: Orthopedics;  Laterality: Right;    There were no vitals filed for this visit.  Visit Diagnosis:  Localized edema  Knee stiffness, right  Muscle weakness  Impaired functional mobility and activity tolerance  History of total  right knee replacement  Difficulty walking      Subjective Assessment - 08/09/15 1302    Subjective Pt reports his back is bothering him today, rating it 2/10 pain across his mid back. He states his back pain is no worse at the end of today's session. Occasional complaints of SOB noted during his session.    Pertinent History prediabetic, hx of hernia, GERD, poor bladder control   Currently in Pain? Yes   Pain Score 2    Pain Location Back                         [facility] Adult PT Treatment/Exercise - 08/09/15 0001    Knee/Hip Exercises: Stretches   Active Hamstring Stretch Right;3 reps;30 seconds   Active Hamstring Stretch Limitations seated   Gastroc Stretch 3 reps;30 seconds   Gastroc Stretch Limitations against board   Knee/Hip Exercises: Standing   Hip Extension Left;Right;1 set;10 reps;Knee bent   Extension Limitations pt unable to follow cues for maintaining knee straight   Step Down Both;2 sets;5 reps;Hand Hold: 2;Step Height: 6"   Wall Squat 2 sets;10 reps  cuing for equal weight shift   Other Standing Knee Exercises Standing marching with alt LE taps on 6" box, 2  hand support in // bars. 30sec on/1 min off x4 trials.   2# with last 2 trials.    Knee/Hip Exercises: Supine   Bridges with Diona Foley Squeeze 2 sets;10 reps                PT Education - 08/09/15 1341    Education provided Yes   Education Details Encouraged pt to speak with his MD concerning his BLE edema and SOB; Discussed exercises that pt can perform in the pool for improved strength   Person(s) Educated Patient   Methods Explanation   Comprehension Verbalized understanding          PT Short Term Goals - 07/31/15 1454    PT SHORT TERM GOAL #1   Title Patient will demonstrate R knee ROM of 0-120 degrees in order to reduce pain, enhance mobility, and improve overall level of function    Baseline 0-115   Time 4   Period Weeks   Status On-going   PT SHORT TERM GOAL #2   Title  Paitent will be independent in consistently and correctly performing at least 3 edema management techniques, including but not limited to ice, retrograde massage, and stretching/exercises, in order to enhance independence in managing condition    Baseline Pt reports using exercises, elevation and ice occasionally.    Status Achieved   PT SHORT TERM GOAL #3   Title Patient will demonstrate improved gait mechanics, including even step lengths and stance times, improved posture, improved gait speed, and minimal unsteadiness in order to enhance overall mobility and assist in returning to community    Baseline ambulating without an assistive device but reaching for walls and objects for increased stability    Time 4   Period Weeks   Status On-going   PT SHORT TERM GOAL #4   Title Patient to be independent in correctly and consistently perofrming appropriate HEP, to be updated PRN    Baseline continue to encourage consistency   Status Achieved           PT Long Term Goals - 07/31/15 1458    PT LONG TERM GOAL #1   Title Patient to demonstrate strength at least 4+/5 in all tested muscle groups in order to reduce pain, enhance mobility, and assist in return to community based tasks    Baseline knee flexion and extension 4+/5   Status Achieved   PT LONG TERM GOAL #2   Title Patient to report that he has been able to return to at least 20 minutes of swimming, at least 3 times per week, with pain R knee no more than 1/10 and minimal fatigue, after being cleared by MD to do so, in order to assist in improved functional actiivty tolerance and returning to PLOF    Baseline not released to swimming yet   Time 8   Period Weeks   Status On-going   PT LONG TERM GOAL #3   Title Patient will be able to perform TUG in 9 seconds or less with no assistive device and will be able to ambulate at least 1255ft during 6MWT with no device or rest breaks in order to reduce fall risk and return to community based  activities    Baseline Able to ambulate 41min 5seconds (778 ft) before requesting a seated rest.    Time 8   Status On-going   PT LONG TERM GOAL #4   Title Patient to be able to reciprocally ascend and descend full flight of stiars with one  railing and minimal unsteadiness, good eccentric control, in order to improve safe mobilty at home and in community    Baseline unable to consistently use reciprocal pattern when asending steps (4 6inch)   Time 8   Period Weeks   Status On-going               Plan - 08/09/15 1343    Clinical Impression Statement Pt arrived noting that his MD has given him medication for his low hemoglobin which is supposed to help his SOB and fatigue. Pt continues to use the bathroom frequently as well as demonstrate BLE pitting edema with increased SOB during general therapeutic activity. Therapist encouraged pt to notify his physician of these findings. His BP was 138/77 prior to the start of today's session and his O2 was monitored throughout, never dropping below 92%. Pt also demonstrates limited hip strength which is impacting his ability to perform sit to stand activity and ascend/descend stairs without difficulty. Will continue to monitor edema and SOB while addressing remaining limitations.    Pt will benefit from skilled therapeutic intervention in order to improve on the following deficits Abnormal gait;Hypomobility;Increased edema;Decreased activity tolerance;Decreased strength;Pain;Decreased balance;Decreased mobility;Difficulty walking;Improper body mechanics;Decreased coordination;Postural dysfunction;Decreased endurance   Rehab Potential Good   Clinical Impairments Affecting Rehab Potential GERD, high urinary frequency, prediabetic    PT Frequency 3x / week   PT Duration 8 weeks   PT Treatment/Interventions ADLs/Self Care Home Management;Cryotherapy;Electrical Stimulation;DME Instruction;Gait training;Stair training;Functional mobility training;Therapeutic  activities;Therapeutic exercise;Balance training;Neuromuscular re-education;Patient/family education;Manual techniques;Scar mobilization;Energy conservation;Taping   PT Next Visit Plan Continue to work on balance, ROM and edema control; trial stairs and bike at low intensity next session    PT Home Exercise Plan continue to encourage consistency with HEP.    Consulted and Agree with Plan of Care Patient        Problem List Patient Active Problem List   Diagnosis Date Noted  . 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 Peripheral edema 08/31/2012  . Urinary frequency 08/31/2012  . Diverticulosis 03/23/2012  . Impaired fasting glucose 02/06/2012  . Decreased libido 03/07/2011  . Erectile dysfunction 03/07/2011  . Essential hypertension, benign 12/20/2010  . Mixed hyperlipidemia 12/20/2010  . ALLERGIC RHINITIS, SEASONAL 05/02/2010  . 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 HIATAL HERNIA, HX OF 05/02/2010   1:54 PM,08/09/2015 Elly Modena PT, DPT [facility] Outpatient Physical Therapy [place] [address] [place], [place], [address] Phone: [phone]   Fax:  [phone]  Name: Gary Carroll MRN: [id number] Date of Birth: 03-Mar-1946

## 2015-08-11 ENCOUNTER — Ambulatory Visit (HOSPITAL_COMMUNITY): Payer: No Typology Code available for payment source

## 2015-08-14 ENCOUNTER — Ambulatory Visit (HOSPITAL_COMMUNITY): Payer: No Typology Code available for payment source | Admitting: Physical Therapy

## 2015-08-14 DIAGNOSIS — M6281 Muscle weakness (generalized): Secondary | ICD-10-CM

## 2015-08-14 DIAGNOSIS — M25661 Stiffness of right knee, not elsewhere classified: Secondary | ICD-10-CM

## 2015-08-14 DIAGNOSIS — Z7409 Other reduced mobility: Secondary | ICD-10-CM

## 2015-08-14 DIAGNOSIS — R600 Localized edema: Secondary | ICD-10-CM

## 2015-08-14 DIAGNOSIS — R262 Difficulty in walking, not elsewhere classified: Secondary | ICD-10-CM

## 2015-08-14 DIAGNOSIS — Z96651 Presence of right artificial knee joint: Secondary | ICD-10-CM

## 2015-08-14 NOTE — Therapy (Signed)
[place], [place], [address] Phone: [phone]   Fax:  [phone]  Physical Therapy Treatment  Patient Details  Name: Gary Carroll MRN: [id number] Date of Birth: February 06, 1946 Referring Provider: Dr. Augustin Coupe  Encounter Date: 08/14/2015      PT End of Session - 08/14/15 1357    Visit Number 14   Number of Visits 18   Date for PT Re-Evaluation 09/25/15   Authorization Type [employer] Medicare primary, [place] insurance secondary    Authorization Time Period 0000000 to 99991111 (re-cert sent [id number])   Authorization - Visit Number 14   Authorization - Number of Visits 20   PT Start Time [id number]   PT Stop Time 1335  subtracted time for multiple bathroom breaks and staff fire drill    PT Time Calculation (min) 32 min   Activity Tolerance Patient tolerated treatment well;Patient limited by fatigue   Behavior During Therapy [facility] for tasks assessed/performed      Past Medical History  Diagnosis Date  . Bilateral knee pain     OA  . Prediabetes   . Allergic rhinitis   . Hiatal hernia   . Heart murmur   . HLD (hyperlipidemia)   . HTN (hypertension)   . Erectile dysfunction   . Arthritis   . Asthma     seasonal per pt  . GERD (gastroesophageal reflux disease)   . Diverticulosis 02/2012    seen on colonoscopy  . Urinary frequency   . Overactive bladder   . Urinary urgency   . Wears glasses   . Ringing of ears     Past Surgical History  Procedure Laterality Date  . Tonsillectomy    . Finger surgery Right   . Total knee arthroplasty  2012    left (Dr. Lorre Nick)  . Cholecystectomy    . Colonoscopy  02/2012    diverticulosis; Dr. Deatra Ina  . Total knee arthroplasty Right 06/19/2015    Procedure: TOTAL KNEE ARTHROPLASTY;  Surgeon: Vickey Huger, MD;  Location: [place];  Service: Orthopedics;  Laterality: Right;    There were no vitals filed for this visit.  Visit Diagnosis:  Localized edema  Knee stiffness,  right  Muscle weakness  Impaired functional mobility and activity tolerance  History of total right knee replacement  Difficulty walking      Subjective Assessment - 08/14/15 1303    Subjective Patient reports he is just feeling lousy, is waiting for the MD to call him back from the other day as he has had another blood test due to concerns over his kidney function. No pain today, but reports that his pain got up to 6/10 on his L LE yesterday. He reports that the biggest concern he has with his R leg is not being able to cross  his legs, and the swelling.    Pertinent History prediabetic, hx of hernia, GERD, poor bladder control   Currently in Pain? No/denies                         [facility] Adult PT Treatment/Exercise - 08/14/15 0001    Knee/Hip Exercises: Stretches   Active Hamstring Stretch Both;3 reps;30 seconds   Active Hamstring Stretch Limitations 12 inch box    Gastroc Stretch 3 reps;30 seconds   Gastroc Stretch Limitations slantboard    Knee/Hip Exercises: Standing   Heel Raises Both;1 set;15 reps   Heel Raises Limitations heel and toe  Rocker Board 2 minutes   Rocker Board Limitations AP and lateral U HHA    Other Standing Knee Exercises 3D hip excursions 1x10 (no rotations)   Other Standing Knee Exercises standing isometric hip extension 1x10; sit to stands with no UEs, eccentric lower 1x5    Knee/Hip Exercises: Supine   Bridges Limitations 2x10    Straight Leg Raises Both;10 reps;1 set   Knee Extension Limitations 0   Knee Flexion Limitations 118  AAROM for last few degrees    Other Supine Knee/Hip Exercises supine hip abductions 1x10 with red TB                 PT Education - 08/14/15 1357    Education provided No          PT Short Term Goals - 07/31/15 1454    PT SHORT TERM GOAL #1   Title Patient will demonstrate R knee ROM of 0-120 degrees in order to reduce pain, enhance mobility, and improve overall level of function     Baseline 0-115   Time 4   Period Weeks   Status On-going   PT SHORT TERM GOAL #2   Title Paitent will be independent in consistently and correctly performing at least 3 edema management techniques, including but not limited to ice, retrograde massage, and stretching/exercises, in order to enhance independence in managing condition    Baseline Pt reports using exercises, elevation and ice occasionally.    Status Achieved   PT SHORT TERM GOAL #3   Title Patient will demonstrate improved gait mechanics, including even step lengths and stance times, improved posture, improved gait speed, and minimal unsteadiness in order to enhance overall mobility and assist in returning to community    Baseline ambulating without an assistive device but reaching for walls and objects for increased stability    Time 4   Period Weeks   Status On-going   PT SHORT TERM GOAL #4   Title Patient to be independent in correctly and consistently perofrming appropriate HEP, to be updated PRN    Baseline continue to encourage consistency   Status Achieved           PT Long Term Goals - 07/31/15 1458    PT LONG TERM GOAL #1   Title Patient to demonstrate strength at least 4+/5 in all tested muscle groups in order to reduce pain, enhance mobility, and assist in return to community based tasks    Baseline knee flexion and extension 4+/5   Status Achieved   PT LONG TERM GOAL #2   Title Patient to report that he has been able to return to at least 20 minutes of swimming, at least 3 times per week, with pain R knee no more than 1/10 and minimal fatigue, after being cleared by MD to do so, in order to assist in improved functional actiivty tolerance and returning to PLOF    Baseline not released to swimming yet   Time 8   Period Weeks   Status On-going   PT LONG TERM GOAL #3   Title Patient will be able to perform TUG in 9 seconds or less with no assistive device and will be able to ambulate at least 1240ft during  6MWT with no device or rest breaks in order to reduce fall risk and return to community based activities    Baseline Able to ambulate 30min 5seconds (778 ft) before requesting a seated rest.    Time 8   Status On-going  PT LONG TERM GOAL #4   Title Patient to be able to reciprocally ascend and descend full flight of stiars with one railing and minimal unsteadiness, good eccentric control, in order to improve safe mobilty at home and in community    Baseline unable to consistently use reciprocal pattern when asending steps (4 6inch)   Time 8   Period Weeks   Status On-going               Plan - 08/14/15 1359    Clinical Impression Statement Continued with funcitonal stretching and strengthening today, with focus on proximal muscle strength as patient does demonstrate signficant hip wewakness at this time. Patient remains fatiuged and requires multiple rest breaks trhoughout session, which does interfere with intensity of skilled PT session; continues to have pitting edema and shortness of breath throughout session. Patient reports taht he is able to do all he needs and wants to do, hwoever does continue to demonstrate severe hip weakess as well as poor form during exercise, requiring cues from PT.    Pt will benefit from skilled therapeutic intervention in order to improve on the following deficits Abnormal gait;Hypomobility;Increased edema;Decreased activity tolerance;Decreased strength;Pain;Decreased balance;Decreased mobility;Difficulty walking;Improper body mechanics;Decreased coordination;Postural dysfunction;Decreased endurance   Rehab Potential Good   Clinical Impairments Affecting Rehab Potential GERD, high urinary frequency, prediabetic    PT Frequency 3x / week   PT Duration 8 weeks   PT Treatment/Interventions ADLs/Self Care Home Management;Cryotherapy;Electrical Stimulation;DME Instruction;Gait training;Stair training;Functional mobility training;Therapeutic  activities;Therapeutic exercise;Balance training;Neuromuscular re-education;Patient/family education;Manual techniques;Scar mobilization;Energy conservation;Taping   PT Next Visit Plan Continue to work on balance, ROM and edema control; trial stairs and bike at low intensity next session    PT Home Exercise Plan continue to encourage consistency with HEP.    Consulted and Agree with Plan of Care Patient        Problem List Patient Active Problem List   Diagnosis Date Noted  . 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 Peripheral edema 08/31/2012  . Urinary frequency 08/31/2012  . Diverticulosis 03/23/2012  . Impaired fasting glucose 02/06/2012  . Decreased libido 03/07/2011  . Erectile dysfunction 03/07/2011  . Essential hypertension, benign 12/20/2010  . Mixed hyperlipidemia 12/20/2010  . ALLERGIC RHINITIS, SEASONAL 05/02/2010  . 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 HIATAL HERNIA, HX OF 05/02/2010    Deniece Ree PT, DPT [facility] [address] [place], [place], [address] Phone: [phone]   Fax:  [phone]  Name: Gary Carroll MRN: [id number] Date of Birth: September 16, 1945

## 2015-08-16 ENCOUNTER — Other Ambulatory Visit (HOSPITAL_COMMUNITY): Payer: Self-pay | Admitting: Internal Medicine

## 2015-08-16 ENCOUNTER — Ambulatory Visit (HOSPITAL_COMMUNITY): Payer: No Typology Code available for payment source | Admitting: Physical Therapy

## 2015-08-16 DIAGNOSIS — Z7409 Other reduced mobility: Secondary | ICD-10-CM

## 2015-08-16 DIAGNOSIS — R262 Difficulty in walking, not elsewhere classified: Secondary | ICD-10-CM

## 2015-08-16 DIAGNOSIS — M6281 Muscle weakness (generalized): Secondary | ICD-10-CM

## 2015-08-16 DIAGNOSIS — R600 Localized edema: Secondary | ICD-10-CM

## 2015-08-16 DIAGNOSIS — Z96651 Presence of right artificial knee joint: Secondary | ICD-10-CM

## 2015-08-16 DIAGNOSIS — M25661 Stiffness of right knee, not elsewhere classified: Secondary | ICD-10-CM

## 2015-08-16 DIAGNOSIS — R7989 Other specified abnormal findings of blood chemistry: Secondary | ICD-10-CM

## 2015-08-16 NOTE — Therapy (Signed)
[place], [place], [address] Phone: [phone]   Fax:  [phone]  Physical Therapy Treatment  Patient Details  Name: Gary Carroll MRN: [id number] Date of Birth: [birth year] Referring Provider: Dr. Augustin Coupe  Encounter Date: 08/16/2015      PT End of Session - 08/16/15 1406    Visit Number 15   Number of Visits 18   Date for PT Re-Evaluation 09/25/15   Authorization Type [employer] Medicare primary, [place] insurance secondary    Authorization Time Period 0000000 to 99991111 (re-cert sent 99991111)   Authorization - Visit Number 15   Authorization - Number of Visits 20   PT Start Time [id number]  late due to restroom break by patient    PT Stop Time 1343   PT Time Calculation (min) 38 min   Activity Tolerance Patient tolerated treatment well;Patient limited by fatigue   Behavior During Therapy [facility] for tasks assessed/performed      Past Medical History  Diagnosis Date  . Bilateral knee pain     OA  . Prediabetes   . Allergic rhinitis   . Hiatal hernia   . Heart murmur   . HLD (hyperlipidemia)   . HTN (hypertension)   . Erectile dysfunction   . Arthritis   . Asthma     seasonal per pt  . GERD (gastroesophageal reflux disease)   . Diverticulosis 02/2012    seen on colonoscopy  . Urinary frequency   . Overactive bladder   . Urinary urgency   . Wears glasses   . Ringing of ears     Past Surgical History  Procedure Laterality Date  . Tonsillectomy    . Finger surgery Right   . Total knee arthroplasty  2012    left (Dr. Lorre Nick)  . Cholecystectomy    . Colonoscopy  02/2012    diverticulosis; Dr. Deatra Ina  . Total knee arthroplasty Right 06/19/2015    Procedure: TOTAL KNEE ARTHROPLASTY;  Surgeon: Vickey Huger, MD;  Location: [place];  Service: Orthopedics;  Laterality: Right;    There were no vitals filed for this visit.  Visit Diagnosis:  Localized edema  Knee stiffness, right  Muscle  weakness  Impaired functional mobility and activity tolerance  History of total right knee replacement  Difficulty walking      Subjective Assessment - 08/16/15 1307    Subjective Patient reports that he is just feeling very worn out and tired today, reports that  he was going to give PT 5 minutes past his scheduled time before going home. Reports that kidney values have come down and MD is having him do an [place] on his kidneys to make sure nothing is blocked up.    Currently in Pain? No/denies                         [facility] Adult PT Treatment/Exercise - 08/16/15 0001    Ambulation/Gait   Ambulation/Gait Yes   Ambulation Distance (Feet) 200 Feet   Gait Comments scuffling gait pattern with poor foot clearance despite PT cues for heel-toe pattern   Knee/Hip Exercises: Stretches   Active Hamstring Stretch Both;3 reps;30 seconds   Active Hamstring Stretch Limitations 12 inch box    Quad Stretch --   Sports administrator Limitations --   Gastroc Stretch 3 reps;30 seconds   Gastroc Stretch Limitations slantboard    Knee/Hip Exercises: Standing   Hip Extension --   SLS x3  both with max of 5 with no UEs    Other Standing Knee Exercises eccentric sit to stands 1x10, cues for equal weight bearing    Knee/Hip Exercises: Supine   Bridges Limitations 2x15   Straight Leg Raises Both;2 sets;10 reps   Straight Leg Raises Limitations 2#   Other Supine Knee/Hip Exercises supine hip abductions 2x10 with red TB    Knee/Hip Exercises: Sidelying   Clams 2x10 each LE                 PT Education - 08/16/15 1406    Education provided Yes   Education Details ecucation regarding benefits of formal compression wraps vs ACE wraps   Person(s) Educated Patient   Methods Explanation   Comprehension Verbalized understanding          PT Short Term Goals - 07/31/15 1454    PT SHORT TERM GOAL #1   Title Patient will demonstrate R knee ROM of 0-120 degrees in order to reduce pain,  enhance mobility, and improve overall level of function    Baseline 0-115   Time 4   Period Weeks   Status On-going   PT SHORT TERM GOAL #2   Title Paitent will be independent in consistently and correctly performing at least 3 edema management techniques, including but not limited to ice, retrograde massage, and stretching/exercises, in order to enhance independence in managing condition    Baseline Pt reports using exercises, elevation and ice occasionally.    Status Achieved   PT SHORT TERM GOAL #3   Title Patient will demonstrate improved gait mechanics, including even step lengths and stance times, improved posture, improved gait speed, and minimal unsteadiness in order to enhance overall mobility and assist in returning to community    Baseline ambulating without an assistive device but reaching for walls and objects for increased stability    Time 4   Period Weeks   Status On-going   PT SHORT TERM GOAL #4   Title Patient to be independent in correctly and consistently perofrming appropriate HEP, to be updated PRN    Baseline continue to encourage consistency   Status Achieved           PT Long Term Goals - 07/31/15 1458    PT LONG TERM GOAL #1   Title Patient to demonstrate strength at least 4+/5 in all tested muscle groups in order to reduce pain, enhance mobility, and assist in return to community based tasks    Baseline knee flexion and extension 4+/5   Status Achieved   PT LONG TERM GOAL #2   Title Patient to report that he has been able to return to at least 20 minutes of swimming, at least 3 times per week, with pain R knee no more than 1/10 and minimal fatigue, after being cleared by MD to do so, in order to assist in improved functional actiivty tolerance and returning to PLOF    Baseline not released to swimming yet   Time 8   Period Weeks   Status On-going   PT LONG TERM GOAL #3   Title Patient will be able to perform TUG in 9 seconds or less with no assistive  device and will be able to ambulate at least 1261ft during 6MWT with no device or rest breaks in order to reduce fall risk and return to community based activities    Baseline Able to ambulate 36min 5seconds (778 ft) before requesting a seated rest.    Time 8  Status On-going   PT LONG TERM GOAL #4   Title Patient to be able to reciprocally ascend and descend full flight of stiars with one railing and minimal unsteadiness, good eccentric control, in order to improve safe mobilty at home and in community    Baseline unable to consistently use reciprocal pattern when asending steps (4 6inch)   Time 8   Period Weeks   Status On-going               Plan - 08/16/15 1321    Clinical Impression Statement Patient arrived today complaining of significant faigute, reports that he "...wore himself out this morning, not feeling good". He remains faituged and SOB throughout session today, requiring multiople rest breaks and restroom breask during session. Focused primarily on functional stretching and table hip strengthening today, as geneeral hip weakness appears to be contributing to patietn's difficulty with mobilty and balance. Did attempt prone quad stretch tdoay but patietn unable to even tolerate being in prone for 10 seconds today, so aborted activity  today due to concerns over discomfort in combination with patient's constant fatigue, SOB, and edema in B LEs.  Educated patient regarding possible benefits of proper compression wraps and benefits of using this instead of ACE wrap to assist in reducing edema. Ended session with eccentric sit to stand with cues for proper equal weight bearing and SLS, which patient had difficulty with likely due to hip weakness. FInished with gait training, with cues from PT for shuffling/scuffling gait pattern however patient not responsive to cues provided by PT today.     Pt will benefit from skilled therapeutic intervention in order to improve on the following  deficits Abnormal gait;Hypomobility;Increased edema;Decreased activity tolerance;Decreased strength;Pain;Decreased balance;Decreased mobility;Difficulty walking;Improper body mechanics;Decreased coordination;Postural dysfunction;Decreased endurance   Rehab Potential Good   Clinical Impairments Affecting Rehab Potential GERD, high urinary frequency, prediabetic    PT Frequency 3x / week   PT Duration 8 weeks   PT Treatment/Interventions ADLs/Self Care Home Management;Cryotherapy;Electrical Stimulation;DME Instruction;Gait training;Stair training;Functional mobility training;Therapeutic activities;Therapeutic exercise;Balance training;Neuromuscular re-education;Patient/family education;Manual techniques;Scar mobilization;Energy conservation;Taping   PT Next Visit Plan Continue to work on balance, ROM and edema control; keep working hip strength as well.    PT Home Exercise Plan continue to encourage consistency with HEP. Gauge interest in compression sleeves, possibly send referral.    Consulted and Agree with Plan of Care Patient        Problem List Patient Active Problem List   Diagnosis Date Noted  . 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 Peripheral edema 08/31/2012  . Urinary frequency 08/31/2012  . Diverticulosis 03/23/2012  . Impaired fasting glucose 02/06/2012  . Decreased libido 03/07/2011  . Erectile dysfunction 03/07/2011  . Essential hypertension, benign 12/20/2010  . Mixed hyperlipidemia 12/20/2010  . ALLERGIC RHINITIS, SEASONAL 05/02/2010  . 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 HIATAL HERNIA, HX OF 05/02/2010    Deniece Ree PT, DPT [facility] [address] [place], [place], [address] Phone: [phone]   Fax:  [phone]  Name: Gary Carroll MRN: [id number] Date of Birth: 12/10/1945

## 2015-08-21 ENCOUNTER — Ambulatory Visit (HOSPITAL_COMMUNITY)
Admission: RE | Admit: 2015-08-21 | Discharge: 2015-08-21 | Disposition: A | Discharge status: Home / Self Care | Payer: Commercial Managed Care - HMO | Source: Ambulatory Visit | Attending: Internal Medicine | Admitting: Internal Medicine

## 2015-08-21 ENCOUNTER — Ambulatory Visit (HOSPITAL_COMMUNITY): Payer: No Typology Code available for payment source | Attending: Orthopedic Surgery | Admitting: Physical Therapy

## 2015-08-21 DIAGNOSIS — R262 Difficulty in walking, not elsewhere classified: Secondary | ICD-10-CM

## 2015-08-21 DIAGNOSIS — R748 Abnormal levels of other serum enzymes: Secondary | ICD-10-CM | POA: Insufficient documentation

## 2015-08-21 DIAGNOSIS — N320 Bladder-neck obstruction: Secondary | ICD-10-CM | POA: Diagnosis not present

## 2015-08-21 DIAGNOSIS — M25661 Stiffness of right knee, not elsewhere classified: Secondary | ICD-10-CM | POA: Diagnosis present

## 2015-08-21 DIAGNOSIS — Z96651 Presence of right artificial knee joint: Secondary | ICD-10-CM | POA: Diagnosis present

## 2015-08-21 DIAGNOSIS — N1330 Unspecified hydronephrosis: Secondary | ICD-10-CM | POA: Insufficient documentation

## 2015-08-21 DIAGNOSIS — R600 Localized edema: Secondary | ICD-10-CM

## 2015-08-21 DIAGNOSIS — Z7409 Other reduced mobility: Secondary | ICD-10-CM

## 2015-08-21 DIAGNOSIS — R7989 Other specified abnormal findings of blood chemistry: Secondary | ICD-10-CM

## 2015-08-21 DIAGNOSIS — M6281 Muscle weakness (generalized): Secondary | ICD-10-CM | POA: Diagnosis present

## 2015-08-21 NOTE — Patient Instructions (Signed)
   BRIDGING  While lying on your back, tighten your lower abdominals, squeeze your buttocks and then raise your buttocks off the floor/bed as creating a "Bridge" with your body.  Repeat 10-15 times, twice a day.    ELASTIC BAND - SUPINE HIP ABDUCTION  While lying on your back, slowly bring your leg out to the side. Keep  your knee straight the entire time, and make sure that your toes are facing up towards the ceiling.   You should feel the muscles working on the side of your hip.  Repeat 15 times each side, twice a day.    SINGLE LEG STANCE - SLS  Stand on one leg and maintain your balance for as long as you can.   Repeat 3 times each side, twice a day.  Tandem Stance    Right foot in front of left, heel touching toe both feet "straight ahead". Stand on Foot Triangle of Support with both feet. Balance in this position _20__ seconds. Do with left foot in front of right.  Repeat twice each side, twice a day.   Copyright  VHI. All rights reserved.

## 2015-08-21 NOTE — Therapy (Signed)
[place], [place], [address] Phone: [phone]   Fax:  [phone]  Physical Therapy Treatment (DC per patient request)  Patient Details  Name: Gary Carroll MRN: [id number] Date of Birth: 11/20/1945 Referring Provider: Dr. Augustin Coupe  Encounter Date: 08/21/2015      PT End of Session - 08/21/15 1610    Visit Number 16   Number of Visits 18   Date for PT Re-Evaluation 09/25/15   Authorization Type [employer] primary, [place] insurance secondary    Authorization Time Period 08/25/12 to 08/25/16 (re-cert sent 5/63/14)   Authorization - Visit Number 16   Authorization - Number of Visits 20   PT Start Time 1302   PT Stop Time 1343   PT Time Calculation (min) 41 min   Activity Tolerance Patient tolerated treatment well;Patient limited by fatigue   Behavior During Therapy [facility] for tasks assessed/performed      Past Medical History  Diagnosis Date  . Bilateral knee pain     OA  . Prediabetes   . Allergic rhinitis   . Hiatal hernia   . Heart murmur   . HLD (hyperlipidemia)   . HTN (hypertension)   . Erectile dysfunction   . Arthritis   . Asthma     seasonal per pt  . GERD (gastroesophageal reflux disease)   . Diverticulosis 02/2012    seen on colonoscopy  . Urinary frequency   . Overactive bladder   . Urinary urgency   . Wears glasses   . Ringing of ears     Past Surgical History  Procedure Laterality Date  . Tonsillectomy    . Finger surgery Right   . Total knee arthroplasty  2012    left (Dr. Lorre Nick)  . Cholecystectomy    . Colonoscopy  02/2012    diverticulosis; Dr. Deatra Ina  . Total knee arthroplasty Right 06/19/2015    Procedure: TOTAL KNEE ARTHROPLASTY;  Surgeon: Vickey Huger, MD;  Location: [facility];  Service: Orthopedics;  Laterality: Right;    There were no vitals filed for this visit.  Visit Diagnosis:  Localized edema  Knee stiffness, right  Muscle weakness  Impaired functional  mobility and activity tolerance  History of total right knee replacement  Difficulty walking      Subjective Assessment - 08/21/15 1305    Subjective Patient arrives today reporting that he has made his decision and he is determined that today is going to be his last day, as he is able to do everything he needs and wants to do right now, does not want to continue with PT. He reports that this decision is in part due to his concern over his ongoing SOB and fatigue and he would like to focus on figutring out what is giong on with this right now.    Pertinent History prediabetic, hx of hernia, GERD, poor bladder control   How long can you sit comfortably? unlimited    How long can you stand comfortably? 4/3- 20 minutes    How long can you walk comfortably? 4/3- 20-30 minutes    Patient Stated Goals get back to swimming   Currently in Pain? No/denies            [facility] PT Assessment - 08/21/15 0001    Observation/Other Assessments   Focus on Therapeutic Outcomes (FOTO)  46% limited    AROM   Right Knee Extension 2   Right Knee Flexion 116   Strength  Right Hip Flexion 3-/5   Right Hip ABduction 3-/5   Left Hip Flexion 3/5   Left Hip ABduction 2+/5   Right Knee Flexion 4+/5   Right Knee Extension 4+/5   Left Knee Flexion 4+/5   Left Knee Extension 4+/5   Right Ankle Dorsiflexion 4+/5   Left Ankle Dorsiflexion 4+/5   6 minute walk test results    Aerobic Endurance Distance Walked 452   Endurance additional comments 3MWT, no rest breaks    High Level Balance   High Level Balance Comments TUG 14.1, no device                              PT Education - 08/21/15 1609    Education provided Yes   Education Details progrses with skilled PT services, advanced HEP, remaining impairments taht suggest patient will benefit from ongoing skilled PT services    Person(s) Educated Patient   Methods Explanation   Comprehension Verbalized understanding           PT Short Term Goals - 08/21/15 1324    PT SHORT TERM GOAL #1   Title Patient will demonstrate R knee ROM of 0-120 degrees in order to reduce pain, enhance mobility, and improve overall level of function    Baseline 4/3- 2 to 116   Time 4   Period Weeks   Status On-going   PT SHORT TERM GOAL #2   Title Paitent will be independent in consistently and correctly performing at least 3 edema management techniques, including but not limited to ice, retrograde massage, and stretching/exercises, in order to enhance independence in managing condition    Time 4   Period Weeks   Status Achieved   PT SHORT TERM GOAL #3   Title Patient will demonstrate improved gait mechanics, including even step lengths and stance times, improved posture, improved gait speed, and minimal unsteadiness in order to enhance overall mobility and assist in returning to community    Baseline 4/3- walking without device however somewhat unsteady, shuffling gait, narrow BOS    Time 4   Period Weeks   Status On-going   PT SHORT TERM GOAL #4   Title Patient to be independent in correctly and consistently perofrming appropriate HEP, to be updated PRN    Baseline 4/3- patient reports he is not doing them every day, only does them when he thinks of them; he does try to go to the gym consistently for pool and weights however    Time 4   Period Weeks   Status On-going           PT Long Term Goals - 08/21/15 1326    PT LONG TERM GOAL #1   Title Patient to demonstrate strength at least 4+/5 in all tested muscle groups in order to reduce pain, enhance mobility, and assist in return to community based tasks    Baseline 4/3- has met around the knee, not generally otherwise    Time 8   Period Weeks   Status Partially Met   PT LONG TERM GOAL #2   Title Patient to report that he has been able to return to at least 20 minutes of swimming, at least 3 times per week, with pain R knee no more than 1/10 and minimal fatigue, after  being cleared by MD to do so, in order to assist in improved functional actiivty tolerance and returning to PLOF    Time 8  Period Weeks   Status Achieved   PT LONG TERM GOAL #3   Title Patient will be able to perform TUG in 9 seconds or less with no assistive device and will be able to ambulate at least 1215f during 6MWT with no device or rest breaks in order to reduce fall risk and return to community based activities    Baseline 4Apr 26, 2024 TUG 10.4, fatigued after 3MWT    Time 8   Period Weeks   Status On-going   PT LONG TERM GOAL #4   Title Patient to be able to reciprocally ascend and descend full flight of stiars with one railing and minimal unsteadiness, good eccentric control, in order to improve safe mobilty at home and in community    Baseline [id number] uses reciprocal pattern hwoever poor eccentric control and needs 2 rails    Time 8   Period Weeks   Status On-going               Plan - 004/26/171613    Clinical Impression Statement Patient arrived today stating that he had made up his mind that this is his last day- he is able to do everything he needs/wants to do, and also that he is concerned regarding what is going on with his health that is causing his fatigue and SOB, and would like to foucs on this right now. Upon examination, patient shows improvement in gait distance, balance, and knee ROM, however continues to demonstrate gait and postural impairment, unsteadiness during gait, severe proximal muscle weakness, and difficulty with stairs at this time. Patient educated on his progress and also his ongoing deficits regarding physical function, however he remanis determined and confident that he is making today his last day. Updated HEP to include more proximal muscle strengthening and educated patiient to do more exercises on land specifically rather than in the pool. DC today per patient request.    Pt will benefit from skilled therapeutic intervention in order to improve on the  following deficits Abnormal gait;Hypomobility;Increased edema;Decreased activity tolerance;Decreased strength;Pain;Decreased balance;Decreased mobility;Difficulty walking;Improper body mechanics;Decreased coordination;Postural dysfunction;Decreased endurance   Rehab Potential Good   Clinical Impairments Affecting Rehab Potential GERD, high urinary frequency, prediabetic    PT Treatment/Interventions ADLs/Self Care Home Management;Cryotherapy;Electrical Stimulation;DME Instruction;Gait training;Stair training;Functional mobility training;Therapeutic activities;Therapeutic exercise;Balance training;Neuromuscular re-education;Patient/family education;Manual techniques;Scar mobilization;Energy conservation;Taping   PT Next Visit Plan DC today per patient request    PT Home Exercise Plan updated HEP today with hip strength and balance    Consulted and Agree with Plan of Care Patient          G-Codes - 004/26/171624    Functional Assessment Tool Used Per FOTO and clinical judgment based on strength, gait, balance, ROM   Functional Limitation Mobility: Walking and moving around   Mobility: Walking and Moving Around Goal Status [phone] At least 20 percent but less than 40 percent impaired, limited or restricted   Mobility: Walking and Moving Around Discharge Status [phone] At least 40 percent but less than 60 percent impaired, limited or restricted      Problem List Patient Active Problem List   Diagnosis Date Noted  . 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 Peripheral edema 08/31/2012  . Urinary frequency 08/31/2012  . Diverticulosis 03/23/2012  . Impaired fasting glucose 02/06/2012  . Decreased libido 03/07/2011  . Erectile dysfunction 03/07/2011  . Essential hypertension, benign 12/20/2010  . Mixed hyperlipidemia 12/20/2010  . ALLERGIC RHINITIS, SEASONAL 05/02/2010  . 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 HIATAL HERNIA,  HX OF 05/02/2010   PHYSICAL THERAPY DISCHARGE SUMMARY  Visits from Start of  Care: 16  Current functional level related to goals / functional outcomes: Patient requests that today be his last day as he is able to do everything he wants and needs to do, also he would like to focus on his other health problems more right now. Patient has indeed been limited more by his fatigue and SOB than anything else during skilled PT sessions, and he was again encouraged to continue working with his doctors to investigate the root cause of this. Unable to convince patient to continue with skilled PT services- DC per patient request today.    Remaining deficits: Gait and posture, unsteadiness, severe proximal weakness, reduced functional activity tolerance   Education / Equipment: Addition to HEP, current impairments he has that warrant continuing ongoing skilled PT services Plan: Patient agrees to discharge.  Patient goals were partially met. Patient is being discharged due to the patient's request.  ?????        Deniece Ree PT, DPT [place], [place], [address] Phone: [phone]   Fax:  [phone]  Name: Gary Carroll MRN: [id number] Date of Birth: 06/21/1945

## 2015-08-22 ENCOUNTER — Encounter (HOSPITAL_COMMUNITY): Payer: Self-pay | Admitting: Emergency Medicine

## 2015-08-22 ENCOUNTER — Observation Stay (HOSPITAL_COMMUNITY)
Admission: EM | Admit: 2015-08-22 | Discharge: 2015-08-24 | Disposition: A | Discharge status: Home / Self Care | Payer: Commercial Managed Care - HMO | Attending: Internal Medicine | Admitting: Internal Medicine

## 2015-08-22 DIAGNOSIS — Z79899 Other long term (current) drug therapy: Secondary | ICD-10-CM | POA: Diagnosis not present

## 2015-08-22 DIAGNOSIS — E785 Hyperlipidemia, unspecified: Secondary | ICD-10-CM | POA: Insufficient documentation

## 2015-08-22 DIAGNOSIS — R350 Frequency of micturition: Secondary | ICD-10-CM | POA: Diagnosis present

## 2015-08-22 DIAGNOSIS — R358 Other polyuria: Secondary | ICD-10-CM | POA: Diagnosis not present

## 2015-08-22 DIAGNOSIS — R319 Hematuria, unspecified: Secondary | ICD-10-CM | POA: Diagnosis present

## 2015-08-22 DIAGNOSIS — R3589 Other polyuria: Secondary | ICD-10-CM | POA: Diagnosis present

## 2015-08-22 DIAGNOSIS — D62 Acute posthemorrhagic anemia: Secondary | ICD-10-CM | POA: Diagnosis present

## 2015-08-22 DIAGNOSIS — I4891 Unspecified atrial fibrillation: Secondary | ICD-10-CM

## 2015-08-22 DIAGNOSIS — N1330 Unspecified hydronephrosis: Secondary | ICD-10-CM | POA: Diagnosis present

## 2015-08-22 DIAGNOSIS — N320 Bladder-neck obstruction: Principal | ICD-10-CM | POA: Diagnosis present

## 2015-08-22 DIAGNOSIS — J45909 Unspecified asthma, uncomplicated: Secondary | ICD-10-CM | POA: Diagnosis not present

## 2015-08-22 DIAGNOSIS — Z87891 Personal history of nicotine dependence: Secondary | ICD-10-CM | POA: Insufficient documentation

## 2015-08-22 DIAGNOSIS — N179 Acute kidney failure, unspecified: Secondary | ICD-10-CM | POA: Diagnosis not present

## 2015-08-22 DIAGNOSIS — I10 Essential (primary) hypertension: Secondary | ICD-10-CM | POA: Diagnosis present

## 2015-08-22 DIAGNOSIS — E870 Hyperosmolality and hypernatremia: Secondary | ICD-10-CM | POA: Diagnosis present

## 2015-08-22 HISTORY — DX: Unspecified hydronephrosis: N13.30

## 2015-08-22 LAB — BASIC METABOLIC PANEL
ANION GAP: 10 (ref 5–15)
BUN: 34 mg/dL — ABNORMAL HIGH (ref 6–20)
CALCIUM: 9.6 mg/dL (ref 8.9–10.3)
CO2: 25 mmol/L (ref 22–32)
CREATININE: 2.1 mg/dL — AB (ref 0.61–1.24)
Chloride: 111 mmol/L (ref 101–111)
GFR, EST AFRICAN AMERICAN: 35 mL/min — AB (ref >60)
GFR, EST NON AFRICAN AMERICAN: 30 mL/min — AB (ref >60)
Glucose, Bld: 106 mg/dL — ABNORMAL HIGH (ref 65–99)
Potassium: 3.9 mmol/L (ref 3.5–5.1)
Sodium: 146 mmol/L — ABNORMAL HIGH (ref 135–145)

## 2015-08-22 LAB — CBC WITH DIFFERENTIAL/PLATELET
BASOS ABS: 0 10*3/uL (ref 0.0–0.1)
Basophils Relative: 1 %
Eosinophils Absolute: 0.2 10*3/uL (ref 0.0–0.7)
Eosinophils Relative: 4 %
HEMATOCRIT: 31.4 % — AB (ref 39.0–52.0)
Hemoglobin: 10.2 g/dL — ABNORMAL LOW (ref 13.0–17.0)
LYMPHS PCT: 22 %
Lymphs Abs: 1 10*3/uL (ref 0.7–4.0)
MCH: 29.7 pg (ref 26.0–34.0)
MCHC: 32.5 g/dL (ref 30.0–36.0)
MCV: 91.5 fL (ref 78.0–100.0)
Monocytes Absolute: 0.8 10*3/uL (ref 0.1–1.0)
Monocytes Relative: 17 %
NEUTROS ABS: 2.7 10*3/uL (ref 1.7–7.7)
Neutrophils Relative %: 56 %
Platelets: 195 10*3/uL (ref 150–400)
RBC: 3.43 MIL/uL — AB (ref 4.22–5.81)
RDW: 14.6 % (ref 11.5–15.5)
WBC: 4.8 10*3/uL (ref 4.0–10.5)

## 2015-08-22 MED ORDER — PRAVASTATIN SODIUM 40 MG PO TABS
40.0000 mg | ORAL_TABLET | Freq: Every day | ORAL | Status: DC
  Administered 2015-08-24: 40 mg via ORAL
  Filled 2015-08-22: qty 1

## 2015-08-22 MED ORDER — ADULT MULTIVITAMIN W/MINERALS CH
1.0000 | ORAL_TABLET | Freq: Every day | ORAL | Status: DC
  Administered 2015-08-23 – 2015-08-24 (×2): 1 via ORAL
  Filled 2015-08-22 (×2): qty 1

## 2015-08-22 MED ORDER — POLYETHYLENE GLYCOL 3350 17 G PO PACK: 17.0000 g | PACK | Freq: Every day | ORAL | Status: DC | PRN

## 2015-08-22 MED ORDER — METOPROLOL TARTRATE 25 MG PO TABS
25.0000 mg | ORAL_TABLET | Freq: Two times a day (BID) | ORAL | Status: DC
  Administered 2015-08-23 – 2015-08-24 (×3): 25 mg via ORAL
  Filled 2015-08-22 (×3): qty 1

## 2015-08-22 MED ORDER — SODIUM CHLORIDE 0.9 % IV SOLN: 250.0000 mL | INTRAVENOUS | Status: DC | PRN

## 2015-08-22 MED ORDER — OMEGA-3-ACID ETHYL ESTERS 1 G PO CAPS
1.0000 g | ORAL_CAPSULE | Freq: Every day | ORAL | Status: DC
  Administered 2015-08-23 – 2015-08-24 (×2): 1 g via ORAL
  Filled 2015-08-22 (×2): qty 1

## 2015-08-22 MED ORDER — TAMSULOSIN HCL 0.4 MG PO CAPS
0.4000 mg | ORAL_CAPSULE | Freq: Every day | ORAL | Status: DC
Start: 2015-08-22 — End: 2015-08-24
  Administered 2015-08-22 – 2015-08-24 (×3): 0.4 mg via ORAL
  Filled 2015-08-22 (×3): qty 1

## 2015-08-22 MED ORDER — ACETAMINOPHEN 325 MG PO TABS
650.0000 mg | ORAL_TABLET | Freq: Four times a day (QID) | ORAL | Status: DC | PRN
  Administered 2015-08-23: 650 mg via ORAL
  Filled 2015-08-22: qty 2

## 2015-08-22 MED ORDER — METOCLOPRAMIDE HCL 10 MG PO TABS: 5.0000 mg | ORAL_TABLET | Freq: Three times a day (TID) | ORAL | Status: DC | PRN

## 2015-08-22 MED ORDER — TAMSULOSIN HCL 0.4 MG PO CAPS: 0.4000 mg | ORAL_CAPSULE | Freq: Every day | ORAL | Status: DC

## 2015-08-22 MED ORDER — FLEET ENEMA 7-19 GM/118ML RE ENEM: 1.0000 | ENEMA | Freq: Every day | RECTAL | Status: DC | PRN

## 2015-08-22 MED ORDER — SODIUM CHLORIDE 0.9% FLUSH: 3.0000 mL | INTRAVENOUS | Status: DC | PRN

## 2015-08-22 MED ORDER — TRAMADOL HCL 50 MG PO TABS: 50.0000 mg | ORAL_TABLET | Freq: Four times a day (QID) | ORAL | Status: DC | PRN

## 2015-08-22 MED ORDER — FERROUS SULFATE 325 (65 FE) MG PO TABS
325.0000 mg | ORAL_TABLET | Freq: Every day | ORAL | Status: DC
Start: 2015-08-23 — End: 2015-08-24
  Administered 2015-08-23 – 2015-08-24 (×2): 325 mg via ORAL
  Filled 2015-08-22 (×2): qty 1

## 2015-08-22 MED ORDER — OXYCODONE HCL ER 10 MG PO T12A
10.0000 mg | EXTENDED_RELEASE_TABLET | Freq: Two times a day (BID) | ORAL | Status: DC
  Filled 2015-08-22 (×2): qty 1

## 2015-08-22 MED ORDER — DOCUSATE SODIUM 100 MG PO CAPS
100.0000 mg | ORAL_CAPSULE | Freq: Two times a day (BID) | ORAL | Status: DC
  Administered 2015-08-23: 100 mg via ORAL
  Filled 2015-08-22 (×2): qty 1

## 2015-08-22 MED ORDER — SODIUM CHLORIDE 0.9% FLUSH
3.0000 mL | Freq: Two times a day (BID) | INTRAVENOUS | Status: DC
  Administered 2015-08-22 – 2015-08-24 (×3): 3 mL via INTRAVENOUS

## 2015-08-22 MED ORDER — ONDANSETRON HCL 4 MG/2ML IJ SOLN: 4.0000 mg | Freq: Four times a day (QID) | INTRAMUSCULAR | Status: DC | PRN

## 2015-08-22 MED ORDER — HEPARIN SODIUM (PORCINE) 5000 UNIT/ML IJ SOLN
5000.0000 [IU] | Freq: Three times a day (TID) | INTRAMUSCULAR | Status: DC
Start: 2015-08-22 — End: 2015-08-24
  Filled 2015-08-22: qty 1

## 2015-08-22 NOTE — ED Notes (Signed)
Pt reports had a urinary catheter placed today. Per pt, MD that placed catheter reported for pt to come to ED if leg bag filled up within 6 hours. Pt reports leg bag filled up in approx 1.5 hours. nad noted.

## 2015-08-22 NOTE — ED Provider Notes (Signed)
CSN: [id number]     Arrival date & time 08/22/15  1636 History   First MD Initiated Contact with Patient 08/22/15 1704     Chief Complaint  Patient presents with  . Urinary Frequency   HPI Patient presents to the emergency room with complaints of excessive urinary output. Patient went and saw a urologist today in [place] for urinary retention. According to notes the patient has with him, he had a Foley catheter placed and drained 3.2 L. The patient had an ultrasound that showed bilateral hydronephrosis associated with urinary retention. Patient was told that if his catheter bag filled up within the next 6 hours he should go to the emergency room. Patient states his bag filled up in one and a half hours so he came to the emergency room here as instructed. Denies any complaints of abdominal pain or chest pain. No lightheadedness. No shortness of breath. Past Medical History  Diagnosis Date  . Bilateral knee pain     OA  . Prediabetes   . Allergic rhinitis   . Hiatal hernia   . Heart murmur   . HLD (hyperlipidemia)   . HTN (hypertension)   . Erectile dysfunction   . Arthritis   . Asthma     seasonal per pt  . GERD (gastroesophageal reflux disease)   . Diverticulosis 02/2012    seen on colonoscopy  . Urinary frequency   . Overactive bladder   . Urinary urgency   . Wears glasses   . Ringing of ears    Past Surgical History  Procedure Laterality Date  . Tonsillectomy    . Finger surgery Right   . Total knee arthroplasty  2012    left (Dr. Lorre Nick)  . Cholecystectomy    . Colonoscopy  02/2012    diverticulosis; Dr. Deatra Ina  . Total knee arthroplasty Right 06/19/2015    Procedure: TOTAL KNEE ARTHROPLASTY;  Surgeon: Vickey Huger, MD;  Location: [facility];  Service: Orthopedics;  Laterality: Right;   Family History  Problem Relation Age of Onset  . Uterine cancer Mother   . Cancer Mother     uterine  . Coronary artery disease Father   . Coronary artery disease Paternal Grandfather   .  Heart disease Paternal Grandfather   . Colon cancer Neg Hx   . Esophageal cancer Neg Hx   . Stomach cancer Neg Hx   . Rectal cancer Neg Hx    Social History  Substance Use Topics  . Smoking status: Former Smoker    Types: Cigarettes    Quit date: 01/18/2010  . Smokeless tobacco: Never Used     Comment: Smoked for 25 years- quit September 2011.  Passive exposure from wife  . Alcohol Use: 0.6 - 1.2 oz/week    1-2 Glasses of wine per week     Comment: drinks a 1-2 glasses of wine daily    Review of Systems  All other systems reviewed and are negative.     Allergies  Diovan and Tagamet  Home Medications   Prior to Admission medications   Medication Sig Start Date End Date Taking? Authorizing Provider  acetaminophen (TYLENOL) 325 MG tablet Take 2 tablets (650 mg total) by mouth every 6 (six) hours as needed for mild pain (or Fever >/= 101). 06/23/15   Carlynn Spry, PA-C  antiseptic oral rinse (BIOTENE) LIQD 15 mLs by Mouth Rinse route as needed for dry mouth.    Historical Provider, MD  carisoprodol (SOMA) 350 MG tablet Take 1  tablet (350 mg total) by mouth 4 (four) times daily. 06/23/15   Carlynn Spry, PA-C  docusate sodium (COLACE) 100 MG capsule Take 1 capsule (100 mg total) by mouth 2 (two) times daily. 06/23/15   Carlynn Spry, PA-C  enoxaparin (LOVENOX) 40 MG/0.4ML injection Inject 0.4 mLs (40 mg total) into the skin daily. 06/23/15   Carlynn Spry, PA-C  losartan (COZAAR) 100 MG tablet Take 1 tablet (100 mg total) by mouth daily. 09/16/12   Rita Ohara, MD  metoCLOPramide (REGLAN) 5 MG tablet Take 1-2 tablets (5-10 mg total) by mouth every 8 (eight) hours as needed for nausea (if ondansetron (ZOFRAN) ineffective.). 06/20/15   Donia Ast, PA  metoprolol tartrate (LOPRESSOR) 25 MG tablet Take 1 tablet (25 mg total) by mouth 2 (two) times daily. 06/09/12 06/06/15  Rita Ohara, MD  Multiple Vitamins-Minerals (MULTIVITAL) tablet Take 1 tablet by mouth daily.      Historical Provider, MD   Omega-3 Fatty Acids (FISH OIL) 1200 MG CAPS Take 2 capsules by mouth 2 (two) times daily.    Historical Provider, MD  oxyCODONE (OXYCONTIN) 10 mg 12 hr tablet Take 1 tablet (10 mg total) by mouth every 12 (twelve) hours. 06/20/15   Donia Ast, PA  polyethylene glycol [facility] Floria Raveling) packet Take 17 g by mouth daily as needed for moderate constipation. 06/23/15   Carlynn Spry, PA-C  pravastatin (PRAVACHOL) 40 MG tablet Take 1 tablet (40 mg total) by mouth daily. 09/16/12   Rita Ohara, MD  sodium chloride (OCEAN) 0.65 % SOLN nasal spray Place 1 spray into both nostrils as needed for congestion.    Historical Provider, MD  sodium phosphate (FLEET) 7-19 GM/118ML ENEM Place 133 mLs (1 enema total) rectally daily as needed for severe constipation. 06/22/15   Carlynn Spry, PA-C  traMADol (ULTRAM) 50 MG tablet Take 1-2 tablets (50-100 mg total) by mouth every 6 (six) hours as needed. 06/23/15   Carlynn Spry, PA-C   BP 182/74 mmHg  Pulse 77  Temp(Src) 98.3 F (36.8 C) (Oral)  Resp 24  Ht 6' (1.829 m)  Wt 112.492 kg  BMI 33.63 kg/m2  SpO2 98% Physical Exam  Constitutional: He appears well-developed and well-nourished. No distress.  HENT:  Head: Normocephalic and atraumatic.  Right Ear: External ear normal.  Left Ear: External ear normal.  Eyes: Conjunctivae are normal. Right eye exhibits no discharge. Left eye exhibits no discharge. No scleral icterus.  Neck: Neck supple. No tracheal deviation present.  Cardiovascular: Normal rate, regular rhythm and intact distal pulses.   Pulmonary/Chest: Effort normal and breath sounds normal. No stridor. No respiratory distress. He has no wheezes. He has no rales.  Abdominal: Soft. Bowel sounds are normal. He exhibits no distension. There is no tenderness. There is no rebound and no guarding.  Genitourinary:  Patient's Foley catheter bag is tensely filled with red-colored urine  Musculoskeletal: He exhibits no edema or tenderness.  Neurological: He  is alert. He has normal strength. No cranial nerve deficit (no facial droop, extraocular movements intact, no slurred speech) or sensory deficit. He exhibits normal muscle tone. He displays no seizure activity. Coordination normal.  Skin: Skin is warm and dry. No rash noted.  Psychiatric: He has a normal mood and affect.  Nursing note and vitals reviewed.   ED Course  Procedures (including critical care time) Labs Review Labs Reviewed  CBC WITH DIFFERENTIAL/PLATELET - Abnormal; Notable for the following:    RBC 3.43 (*)    Hemoglobin 10.2 (*)  HCT 31.4 (*)    All other components within normal limits  BASIC METABOLIC PANEL - Abnormal; Notable for the following:    Sodium 146 (*)    Glucose, Bld 106 (*)    BUN 34 (*)    Creatinine, Ser 2.10 (*)    GFR calc non Af Amer 30 (*)    GFR calc Af Amer 35 (*)    All other components within normal limits    Imaging Review US Renal  08/21/2015  CLINICAL DATA:  Elevated serum creatinine EXAM: RENAL / URINARY TRACT ULTRASOUND COMPLETE COMPARISON:  None. FINDINGS: Right Kidney: Length: 13 cm. Moderate hydronephrosis with mild improvement after voiding. Normal echogenicity. No cortical thinning. No mass. Left Kidney: Length: 13 cm. Moderate hydronephrosis with mild improvement after voiding. Normal echogenicity. No cortical thinning. No mass. Bladder: Dilated bladder which limits complete visualization. Pre and postvoid volume is 2400 and 2200cc respectively. No thickening or debris identified. These results will be called to the ordering clinician or representative by the Radiologist Assistant, and communication documented in the PACS or zVision Dashboard. IMPRESSION: Bladder outlet obstruction with bilateral hydronephrosis. Electronically Signed   By: Monte Fantasia M.D.   On: 08/21/2015 12:24   I have personally reviewed and evaluated these images and lab results as part of my medical decision-making.    MDM   Final diagnoses:  Diuresis  excessive  Bladder outlet obstruction    Patient appears to be having a postobstructive diuresis. He is currently asymptomatic. We'll check blood count and electrolytes and consult with his urologist to see if the patient will need to be admitted for monitoring of his intake and output and possible IV fluid replacement.  Discussed with Dr Louis Meckel.  He does not recommend replacing his fluid loss directly but will need to monitor his electrolytes.  Pt can continue oral fluid hydration.  Plan on admission to medical service for monitoring of his fluid losses and electrolytes.    Dorie Rank, MD 08/22/15 [phone]

## 2015-08-22 NOTE — H&P (Signed)
[facility] History and Physical  Gary Carroll [id number] DOB: [birth year] DOA: 08/22/2015  Referring physician: ED physician PCP: Wende Neighbors, MD  Specialists: None listed  Chief Complaint:  Excessive diuresis   HPI: Gary Carroll is a 70 y.o. male with PMH of hypertension, hyperlipidemia, and chronic bilateral knee pain secondary to osteoarthritis who presents to the ED at the direction of his urologist for evaluation of excessive diuresis. Patient was admitted to the hospital on 06/19/2015 for elective total right knee arthroplasty. He tolerated the procedure well with no immediate complications but did develop some urinary retention postoperatively. He was managed with Flomax but has continued to retain urine. He was evaluated by urology and underwent renal ultrasound yesterday that is demonstrated bilateral hydronephrosis secondary to a bladder outlet obstruction. Foley catheter was placed in the urology office prior to admission and there was immediate diuresis of approximately 2 L. Bag was drained and the patient was told to seek evaluation in the emergency department if the catheter bag was to refill within 6 hours. Patient reports that the bag and become full and taut within 90 minutes, prompting his presentation to the ED. Patient denies any fevers, chills, abdominal pain, flank pain, nausea, vomiting, diarrhea, or dysuria. He reports feeling quite well at this time, denying chest pain or palpitations. He denies lightheadedness or presyncope.  In ED, patient was found to be afebrile, saturating well on room air, and with vital signs stable. BNP features hypernatremia to 146, and a serum creatinine of 2.1, up from an apparent baseline of 0.8. CBC features a hemoglobin of 10.2, down from an apparent baseline of 13-14. EDP reached the on-call urologist, Dr. Louis Meckel, and the case was discussed. Patient was recommended for admission to the hospital to monitor his electrolytes during  this diuresis.   Where does patient live?   At home     Can patient participate in ADLs?  Yes       Review of Systems:   General: no fevers, chills, sweats, weight change, poor appetite, or fatigue HEENT: no blurry vision, hearing changes or sore throat Pulm: no dyspnea, cough, or wheeze CV: no chest pain or palpitations Abd: no nausea, vomiting, abdominal pain, diarrhea, or constipation GU: no dysuria, increased urinary frequency, or urgency. Gross hematuria.  Ext: Worsening b/l LE edema Neuro: no focal weakness, numbness, or tingling, no vision change or hearing loss Skin: no rash, no wounds MSK: No muscle spasm, no deformity, no red, hot, or swollen joint Heme: No easy bruising or bleeding Travel history: No recent long distant travel    Allergy:  Allergies  Allergen Reactions  . Diovan [Valsartan] Cough  . Tagamet [Cimetidine] Other (See Comments)    irrittability    Past Medical History  Diagnosis Date  . Bilateral knee pain     OA  . Prediabetes   . Allergic rhinitis   . Hiatal hernia   . Heart murmur   . HLD (hyperlipidemia)   . HTN (hypertension)   . Erectile dysfunction   . Arthritis   . Asthma     seasonal per pt  . GERD (gastroesophageal reflux disease)   . Diverticulosis 02/2012    seen on colonoscopy  . Urinary frequency   . Overactive bladder   . Urinary urgency   . Wears glasses   . Ringing of ears   . Hydronephrosis determined by ultrasound 08/22/2015    Past Surgical History  Procedure Laterality Date  . Tonsillectomy    .  Finger surgery Right   . Total knee arthroplasty  2012    left (Dr. Lorre Nick)  . Cholecystectomy    . Colonoscopy  02/2012    diverticulosis; Dr. Deatra Ina  . Total knee arthroplasty Right 06/19/2015    Procedure: TOTAL KNEE ARTHROPLASTY;  Surgeon: Vickey Huger, MD;  Location: [facility];  Service: Orthopedics;  Laterality: Right;    Social History:  reports that he quit smoking about 5 years ago. His smoking use included  Cigarettes. He has never used smokeless tobacco. He reports that he drinks about 0.6 - 1.2 oz of alcohol per week. He reports that he does not use illicit drugs.  Family History:  Family History  Problem Relation Age of Onset  . Uterine cancer Mother   . Cancer Mother     uterine  . Coronary artery disease Father   . Coronary artery disease Paternal Grandfather   . Heart disease Paternal Grandfather   . Colon cancer Neg Hx   . Esophageal cancer Neg Hx   . Stomach cancer Neg Hx   . Rectal cancer Neg Hx      Prior to Admission medications   Medication Sig Start Date End Date Taking? Authorizing Provider  acetaminophen (TYLENOL) 325 MG tablet Take 2 tablets (650 mg total) by mouth every 6 (six) hours as needed for mild pain (or Fever >/= 101). 06/23/15   Carlynn Spry, PA-C  antiseptic oral rinse (BIOTENE) LIQD 15 mLs by Mouth Rinse route as needed for dry mouth.    Historical Provider, MD  carisoprodol (SOMA) 350 MG tablet Take 1 tablet (350 mg total) by mouth 4 (four) times daily. 06/23/15   Carlynn Spry, PA-C  docusate sodium (COLACE) 100 MG capsule Take 1 capsule (100 mg total) by mouth 2 (two) times daily. 06/23/15   Carlynn Spry, PA-C  enoxaparin (LOVENOX) 40 MG/0.4ML injection Inject 0.4 mLs (40 mg total) into the skin daily. 06/23/15   Carlynn Spry, PA-C  losartan (COZAAR) 100 MG tablet Take 1 tablet (100 mg total) by mouth daily. 09/16/12   Rita Ohara, MD  metoCLOPramide (REGLAN) 5 MG tablet Take 1-2 tablets (5-10 mg total) by mouth every 8 (eight) hours as needed for nausea (if ondansetron (ZOFRAN) ineffective.). 06/20/15   Donia Ast, PA  metoprolol tartrate (LOPRESSOR) 25 MG tablet Take 1 tablet (25 mg total) by mouth 2 (two) times daily. 06/09/12 06/06/15  Rita Ohara, MD  Multiple Vitamins-Minerals (MULTIVITAL) tablet Take 1 tablet by mouth daily.      Historical Provider, MD  Omega-3 Fatty Acids (FISH OIL) 1200 MG CAPS Take 2 capsules by mouth 2 (two) times daily.    Historical  Provider, MD  oxyCODONE (OXYCONTIN) 10 mg 12 hr tablet Take 1 tablet (10 mg total) by mouth every 12 (twelve) hours. 06/20/15   Donia Ast, PA  polyethylene glycol [facility] Floria Raveling) packet Take 17 g by mouth daily as needed for moderate constipation. 06/23/15   Carlynn Spry, PA-C  pravastatin (PRAVACHOL) 40 MG tablet Take 1 tablet (40 mg total) by mouth daily. 09/16/12   Rita Ohara, MD  sodium chloride (OCEAN) 0.65 % SOLN nasal spray Place 1 spray into both nostrils as needed for congestion.    Historical Provider, MD  sodium phosphate (FLEET) 7-19 GM/118ML ENEM Place 133 mLs (1 enema total) rectally daily as needed for severe constipation. 06/22/15   Carlynn Spry, PA-C  traMADol (ULTRAM) 50 MG tablet Take 1-2 tablets (50-100 mg total) by mouth every 6 (six) hours as  needed. 06/23/15   Carlynn Spry, PA-C    Physical Exam: Filed Vitals:   08/22/15 1641  BP: 182/74  Pulse: 77  Temp: 98.3 F (36.8 C)  TempSrc: Oral  Resp: 24  Height: 6' (1.829 m)  Weight: 112.492 kg (248 lb)  SpO2: 98%   General: Not in acute distress HEENT:       Eyes: PERRL, EOMI, no scleral icterus or conjunctival pallor.       ENT: No discharge from the ears or nose, no pharyngeal ulcers, oral mucosa moist.        Neck: No JVD, no bruit, no appreciable mass Heme: No cervical adenopathy, no pallor Cardiac: S1/S2, RRR, soft systolic murmur at LSB, No gallops or rubs. Pulm: Good air movement bilaterally. No rales, wheezing, rhonchi or rubs. Abd: Soft, nondistended, nontender, no rebound pain or gaurding, BS present. Ext:  Bilateral LE edema to the thighs. 2+DP/PT pulse bilaterally. Musculoskeletal: No gross deformity, no red, hot, swollen joints   Skin: No rashes or wounds on exposed surfaces  Neuro: Alert, oriented X3, cranial nerves II-XII grossly intact. No focal findings Psych: Patient is not overtly psychotic, appropriate mood and affect.  Labs on Admission:  Basic Metabolic Panel:  Recent Labs Lab  08/22/15 1725  NA 146*  K 3.9  CL 111  CO2 25  GLUCOSE 106*  BUN 34*  CREATININE 2.10*  CALCIUM 9.6   Liver Function Tests: No results for input(s): AST, ALT, ALKPHOS, BILITOT, PROT, ALBUMIN in the last 168 hours. No results for input(s): LIPASE, AMYLASE in the last 168 hours. No results for input(s): AMMONIA in the last 168 hours. CBC:  Recent Labs Lab 08/22/15 1725  WBC 4.8  NEUTROABS 2.7  HGB 10.2*  HCT 31.4*  MCV 91.5  PLT 195   Cardiac Enzymes: No results for input(s): CKTOTAL, CKMB, CKMBINDEX, TROPONINI in the last 168 hours.  BNP (last 3 results) No results for input(s): BNP in the last 8760 hours.  ProBNP (last 3 results) No results for input(s): PROBNP in the last 8760 hours.  CBG: No results for input(s): GLUCAP in the last 168 hours.  Radiological Exams on Admission: US Renal  08/21/2015  CLINICAL DATA:  Elevated serum creatinine EXAM: RENAL / URINARY TRACT ULTRASOUND COMPLETE COMPARISON:  None. FINDINGS: Right Kidney: Length: 13 cm. Moderate hydronephrosis with mild improvement after voiding. Normal echogenicity. No cortical thinning. No mass. Left Kidney: Length: 13 cm. Moderate hydronephrosis with mild improvement after voiding. Normal echogenicity. No cortical thinning. No mass. Bladder: Dilated bladder which limits complete visualization. Pre and postvoid volume is 2400 and 2200cc respectively. No thickening or debris identified. These results will be called to the ordering clinician or representative by the Radiologist Assistant, and communication documented in the PACS or zVision Dashboard. IMPRESSION: Bladder outlet obstruction with bilateral hydronephrosis. Electronically Signed   By: Monte Fantasia M.D.   On: 08/21/2015 12:24    EKG:  Not done in ED, will obtain as appropriate   Assessment/Plan  1. Postobstructive diuresis  - Pt developed BOO with retention following right total knee arthroplasty on 06/19/15  - There is an associated AKI and b/l  hydronephrosis seen on renal US  - Foley was placed by urology on 08/22/15 and he was advised to come in to the ED for excessive diuresis  - He reportedly had >2L out when catheter was placed, and the collection bag refilled, becoming taut within 90 minutes  - EDP discussed the case with on-call urologist who recommends admission  for electrolyte monitoring, but advises against 1:1 fluid replacement, suggesting instead that pt be allowed ad lid PO fluids  2. Bladder outlet obstruction with b/l hydronephrosis  - Per pt report, this has been attributed to BPH  - There was acute worsening after his right TKA on 06/19/15  - Foley placed by urology in the clinic on 08/22/15   3. Acute kidney injury  - Likely secondary to postrenal obstruction with bilateral hydronephrosis  - SCr is 2.10 on admission, up from an apparent baseline of 0.8 - 0.9  - Anticipate improvement now that the obstruction has been relieved  - Repeat chem panel overnight and in the am   4. Hypernatremia  - Serum sodium is 146 on admission  - Secondary to fluid shifts associated with postobstructive diuresis   - Replace free water with ad lib PO intake  - Repeat chem panel and intervene prn    5. Anemia  - Hgb is 10.2 on admission, down from apparent baseline of 13 -14  - Likely secondary to perioperative losses and hematuria  - He has been using Lovenox 40 mg qD for post-operative VTE ppx  - Repeat H/H in the am to insure stability  - Continue iron supplementation    DVT ppx: SQ Heparin     Code Status: Full code Family Communication: None at bed side.               Disposition Plan: Admit to inpatient   Date of Service 08/22/2015    Vianne Bulls, MD [facility] Pager [phone]  If 7PM-7AM, please contact night-coverage [email or web address] Password [email or web address] 08/22/2015, 7:04 PM

## 2015-08-23 ENCOUNTER — Encounter (HOSPITAL_COMMUNITY): Payer: Medicare Other | Admitting: Physical Therapy

## 2015-08-23 DIAGNOSIS — N179 Acute kidney failure, unspecified: Secondary | ICD-10-CM | POA: Diagnosis not present

## 2015-08-23 DIAGNOSIS — R358 Other polyuria: Secondary | ICD-10-CM

## 2015-08-23 DIAGNOSIS — N320 Bladder-neck obstruction: Secondary | ICD-10-CM | POA: Diagnosis not present

## 2015-08-23 LAB — BASIC METABOLIC PANEL
Anion gap: 10 (ref 5–15)
Anion gap: 11 (ref 5–15)
BUN: 30 mg/dL — ABNORMAL HIGH (ref 6–20)
BUN: 32 mg/dL — ABNORMAL HIGH (ref 6–20)
CALCIUM: 9.2 mg/dL (ref 8.9–10.3)
CALCIUM: 9.5 mg/dL (ref 8.9–10.3)
CHLORIDE: 110 mmol/L (ref 101–111)
CHLORIDE: 111 mmol/L (ref 101–111)
CO2: 24 mmol/L (ref 22–32)
CO2: 24 mmol/L (ref 22–32)
CREATININE: 1.89 mg/dL — AB (ref 0.61–1.24)
CREATININE: 2.07 mg/dL — AB (ref 0.61–1.24)
GFR calc Af Amer: 36 mL/min — ABNORMAL LOW (ref >60)
GFR calc Af Amer: 40 mL/min — ABNORMAL LOW (ref >60)
GFR calc non Af Amer: 31 mL/min — ABNORMAL LOW (ref >60)
GFR calc non Af Amer: 34 mL/min — ABNORMAL LOW (ref >60)
GLUCOSE: 107 mg/dL — AB (ref 65–99)
GLUCOSE: 156 mg/dL — AB (ref 65–99)
Potassium: 3.5 mmol/L (ref 3.5–5.1)
Potassium: 3.8 mmol/L (ref 3.5–5.1)
Sodium: 145 mmol/L (ref 135–145)
Sodium: 145 mmol/L (ref 135–145)

## 2015-08-23 LAB — PROTIME-INR
INR: 1.25 (ref 0.00–1.49)
Prothrombin Time: 15.8 seconds — ABNORMAL HIGH (ref 11.6–15.2)

## 2015-08-23 LAB — APTT: APTT: 30 s (ref 24–37)

## 2015-08-23 LAB — HEMATOCRIT: HCT: 33.5 % — ABNORMAL LOW (ref 39.0–52.0)

## 2015-08-23 LAB — MAGNESIUM: Magnesium: 1.7 mg/dL (ref 1.7–2.4)

## 2015-08-23 LAB — HEMOGLOBIN: Hemoglobin: 10.8 g/dL — ABNORMAL LOW (ref 13.0–17.0)

## 2015-08-23 MED ORDER — SODIUM CHLORIDE 0.9 % IV SOLN
INTRAVENOUS | Status: AC
  Administered 2015-08-23: 13:00:00 via INTRAVENOUS

## 2015-08-23 NOTE — Progress Notes (Signed)
[facility] PROGRESS NOTE  Blake Queenan Magro [id number] DOB: 12-04-1945 DOA: 08/22/2015 PCP: Wende Neighbors, MD  Assessment/Plan: Acute renal failure -Likely due to obstructive uropathy, had massive diuresis after placement of Foley catheter and was referred to the emergency department by his urologist for replacement of fluid and electrolytes. -Creatinine has improved to 1.89. -Recheck renal function in a.m.  Bladder outlet obstruction with bilateral hydronephrosis -Has Foley catheter in place, with follow-up with urology as scheduled.  Hypernatremia -Improved, will give some IV fluids today.  Code Status: Full code Family Communication: Patient only  Disposition Plan: Likely home in 24 hours   Consultants:  None   Antibiotics:  None   Subjective: No complaints, anxious to go home  Objective: Filed Vitals:   08/22/15 2124 08/23/15 0516 08/23/15 0844 08/23/15 1607  BP: 164/66 152/71 129/66 154/75  Pulse: 76 75 74 68  Temp: 99.3 F (37.4 C) 98.8 F (37.1 C) 98 F (36.7 C) 98.1 F (36.7 C)  TempSrc: Axillary Oral Axillary   Resp: 20 20  18   Height: 6' (1.829 m)     Weight: 107.52 kg (237 lb 0.6 oz)     SpO2: 95% 94% 93% 96%    Intake/Output Summary (Last 24 hours) at 08/23/15 1726 Last data filed at 08/23/15 1018  Gross per 24 hour  Intake   1220 ml  Output   7300 ml  Net  -6080 ml   Filed Weights   08/22/15 1641 08/22/15 2124  Weight: 112.492 kg (248 lb) 107.52 kg (237 lb 0.6 oz)    Exam:   General:  Alert, awake, oriented 3  Cardiovascular: Regular rate and rhythm  Respiratory: Clear to auscultation bilaterally  Abdomen: Soft, nontender, nondistended, positive bowel sounds  Extremities: Trace bilateral edema   Neurologic:  Grossly intact and nonfocal  Data Reviewed: Basic Metabolic Panel:  Recent Labs Lab 08/22/15 1725 08/23/15 0013 08/23/15 0554  NA 146* 145 145  K 3.9 3.8 3.5  CL 111 111 110  CO2 25 24 24   GLUCOSE  106* 156* 107*  BUN 34* 32* 30*  CREATININE 2.10* 2.07* 1.89*  CALCIUM 9.6 9.5 9.2  MG  --  1.7  --    Liver Function Tests: No results for input(s): AST, ALT, ALKPHOS, BILITOT, PROT, ALBUMIN in the last 168 hours. No results for input(s): LIPASE, AMYLASE in the last 168 hours. No results for input(s): AMMONIA in the last 168 hours. CBC:  Recent Labs Lab 08/22/15 1725 08/23/15 0013  WBC 4.8  --   NEUTROABS 2.7  --   HGB 10.2* 10.8*  HCT 31.4* 33.5*  MCV 91.5  --   PLT 195  --    Cardiac Enzymes: No results for input(s): CKTOTAL, CKMB, CKMBINDEX, TROPONINI in the last 168 hours. BNP (last 3 results) No results for input(s): BNP in the last 8760 hours.  ProBNP (last 3 results) No results for input(s): PROBNP in the last 8760 hours.  CBG: No results for input(s): GLUCAP in the last 168 hours.  No results found for this or any previous visit (from the past 240 hour(s)).   Studies: No results found.  Scheduled Meds: . docusate sodium  100 mg Oral BID  . ferrous sulfate  325 mg Oral Q breakfast  . heparin  5,000 Units Subcutaneous 3 times per day  . metoprolol tartrate  25 mg Oral BID  . multivitamin with minerals  1 tablet Oral Daily  . omega-3 acid ethyl esters  1 g  Oral Daily  . oxyCODONE  10 mg Oral Q12H  . pravastatin  40 mg Oral Daily  . sodium chloride flush  3 mL Intravenous Q12H  . tamsulosin  0.4 mg Oral QPC supper   Continuous Infusions: . sodium chloride 100 mL/hr at 08/23/15 1234    Principal Problem:   Diuresis excessive Active Problems:   Essential hypertension, benign   Bladder outlet obstruction   Hydronephrosis determined by ultrasound   AKI (acute kidney injury) ([place])   Hypernatremia   Acute blood loss anemia   Hematuria    Time spent: 25 minutes. Greater than 50% of this time was spent in direct contact with the patient coordinating care.    Lelon Frohlich  [facility] Pager [phone]  If 7PM-7AM, please contact  night-coverage at [email or web address], password [facility] 08/23/2015, 5:26 PM

## 2015-08-23 NOTE — Progress Notes (Signed)
rec'd report from nurse, assumed care of patient, [facility]

## 2015-08-23 NOTE — Care Management Note (Signed)
Case Management Note  Patient Details  Name: SAJAD PENTZ MRN: [id number] Date of Birth: Mar 12, 1946  Subjective/Objective:                  Pt is from home, admitted for excessive diuresis. Pt is from home, lives alone and is ind with ADL's. Pt has no HH services or DME PTA. Pt has PCP, transportation and no difficulty obtaining medications. Pt has no DME needs PTA. Pt plans to return home with self care at DC.   Action/Plan: No CM needs anticipated.   Expected Discharge Date:    08/24/2015              Expected Discharge Plan:  Home/Self Care  In-House Referral:  NA  Discharge planning Services  CM Consult  Post Acute Care Choice:  NA Choice offered to:  NA  DME Arranged:    DME Agency:     HH Arranged:    HH Agency:     Status of Service:  Completed, signed off  Medicare Important Message Given:    Date Medicare IM Given:    Medicare IM give by:    Date Additional Medicare IM Given:    Additional Medicare Important Message give by:     If discussed at [place] of Stay Meetings, dates discussed:    Additional Comments:  Sherald Barge, RN 08/23/2015, 11:05 AM

## 2015-08-23 NOTE — Care Management Obs Status (Signed)
[facility] NOTIFICATION   Patient Details  Name: Gary Carroll MRN: [id number] Date of Birth: Jun 11, 1945   Medicare Observation Status Notification Given:  Yes    Sherald Barge, RN 08/23/2015, 11:05 AM

## 2015-08-24 ENCOUNTER — Observation Stay (HOSPITAL_BASED_OUTPATIENT_CLINIC_OR_DEPARTMENT_OTHER): Payer: Commercial Managed Care - HMO

## 2015-08-24 DIAGNOSIS — R079 Chest pain, unspecified: Secondary | ICD-10-CM

## 2015-08-24 DIAGNOSIS — R358 Other polyuria: Secondary | ICD-10-CM | POA: Diagnosis not present

## 2015-08-24 DIAGNOSIS — I4891 Unspecified atrial fibrillation: Secondary | ICD-10-CM

## 2015-08-24 DIAGNOSIS — N320 Bladder-neck obstruction: Secondary | ICD-10-CM

## 2015-08-24 DIAGNOSIS — N179 Acute kidney failure, unspecified: Secondary | ICD-10-CM | POA: Diagnosis not present

## 2015-08-24 LAB — BASIC METABOLIC PANEL
Anion gap: 11 (ref 5–15)
BUN: 23 mg/dL — AB (ref 6–20)
CALCIUM: 8.9 mg/dL (ref 8.9–10.3)
CO2: 23 mmol/L (ref 22–32)
CREATININE: 1.48 mg/dL — AB (ref 0.61–1.24)
Chloride: 110 mmol/L (ref 101–111)
GFR calc Af Amer: 54 mL/min — ABNORMAL LOW (ref >60)
GFR calc non Af Amer: 46 mL/min — ABNORMAL LOW (ref >60)
GLUCOSE: 109 mg/dL — AB (ref 65–99)
Potassium: 3.3 mmol/L — ABNORMAL LOW (ref 3.5–5.1)
Sodium: 144 mmol/L (ref 135–145)

## 2015-08-24 LAB — TROPONIN I: Troponin I: <0.03 ng/mL (ref <0.031)

## 2015-08-24 LAB — MAGNESIUM: Magnesium: 1.6 mg/dL — ABNORMAL LOW (ref 1.7–2.4)

## 2015-08-24 MED ORDER — APIXABAN 5 MG PO TABS
5.0000 mg | ORAL_TABLET | Freq: Two times a day (BID) | ORAL | Status: DC
  Administered 2015-08-24: 5 mg via ORAL
  Filled 2015-08-24: qty 1

## 2015-08-24 MED ORDER — METOPROLOL TARTRATE 25 MG PO TABS: 25.0000 mg | ORAL_TABLET | Freq: Two times a day (BID) | ORAL | Status: DC

## 2015-08-24 MED ORDER — APIXABAN 5 MG PO TABS: 5.0000 mg | ORAL_TABLET | Freq: Two times a day (BID) | ORAL | Status: AC

## 2015-08-24 MED ORDER — METOPROLOL TARTRATE 1 MG/ML IV SOLN
5.0000 mg | INTRAVENOUS | Status: DC | PRN
  Administered 2015-08-24 (×3): 5 mg via INTRAVENOUS
  Filled 2015-08-24 (×3): qty 5

## 2015-08-24 NOTE — Progress Notes (Signed)
D-  HR increasing on telemetry into 110's then acutely to 140-170.Patient denies history of irregular or fast heart rate.  Patient denies symptoms of tachycardia such as SOB or palpatiations.  He remains alert and oriented with palpable radial and pedal pulses.  All other vitals are WDL.  A- EKG performed by RT and  Dr. Loleta Books notified of the above. EKG confirmed Afib.  Dr. Loleta Books came to the unit to assess patient and placed orders in [facility].  Orders in [facility] carried out as written and patient converted from Afib 140-170's to SR 70-80's.  Dr. Loleta Books notified of the conversion to SR and new order was obtained and placed in [facility] for an EKG to verify SR.  R- Patient in bed and continues to deny complaint.  Telemetry shows NSR rate 81 and all other vital signs are WDL.  Report of the above given to next RN to monitor vitals signs as appropriate.  Nursing staff to continue to monitor

## 2015-08-24 NOTE — Consult Note (Signed)
   [facility] Inpatient Consult   08/24/2015  Gary Carroll [facility] 01/23/1946 [id number]  Spoke with patient at bedside regarding [facility] services. Patient does not want to participate with [facility] at this time. Patient given [facility] brochure and contact information for future reference, voices appreciation of information.    Inpatient case manager aware that patient offered [facility] case management services but declined.   Of note, [facility] Care Management services would not replace or interfere with any services that are arranged by inpatient case management or social work. For additional questions or referrals please contact:   Gary Carroll. Laymond Purser, RN, BSN, [facility] Liaison [phone]

## 2015-08-24 NOTE — Discharge Instructions (Signed)

## 2015-08-24 NOTE — Progress Notes (Signed)
Patient alert and oriented, independent, VSS, pt. Tolerating diet well. No complaints of pain or nausea. Pt. Had IV removed tip intact. Pt. Had prescriptions given. Pt. Voiced understanding of discharge instructions with no further questions. Pt. Discharged via wheelchair with auxilliary.  

## 2015-08-24 NOTE — Care Management Note (Signed)
Case Management Note  Patient Details  Name: Gary Carroll MRN: [id number] Date of Birth: 1946-01-01  Expected Discharge Date:      08/24/2015            Expected Discharge Plan:  Home/Self Care  In-House Referral:  NA  Discharge planning Services  CM Consult  Post Acute Care Choice:  NA Choice offered to:  NA  DME Arranged:    DME Agency:     HH Arranged:    [place] Agency:     Status of Service:  Completed, signed off  Medicare Important Message Given:    Date Medicare IM Given:    Medicare IM give by:    Date Additional Medicare IM Given:    Additional Medicare Important Message give by:     If discussed at [place] of Stay Meetings, dates discussed:    Additional Comments: Pt discharging home today. Pt with a-fib over night, will begin Eliquis. Benefits check completed and results provided to pt. Pt also given 30-day free voucher for Eliquis. No further CM needs.   Sherald Barge, RN 08/24/2015, 1:39 PM

## 2015-08-24 NOTE — Discharge Summary (Signed)
Physician Discharge Summary  Rasool Pennel Coates [id number] DOB: 05/10/1946 DOA: 08/22/2015  PCP: Wende Neighbors, MD  Admit date: 08/22/2015 Discharge date: 08/24/2015  Time spent: 45 minutes  Recommendations for Outpatient Follow-up:  -Will be discharged home today. -Advised to follow-up with primary care physician in 2 weeks at which time formal echo results should be looked upon.   Discharge Diagnoses:  Principal Problem:   Diuresis excessive Active Problems:   Essential hypertension, benign   Bladder outlet obstruction   Hydronephrosis determined by ultrasound   AKI (acute kidney injury) ([place])   Hypernatremia   Acute blood loss anemia   Hematuria   New onset atrial fibrillation [facility])   Discharge Condition: Stable and improved  Filed Weights   08/22/15 1641 08/22/15 2124 08/24/15 0348  Weight: 112.492 kg (248 lb) 107.52 kg (237 lb 0.6 oz) 104.463 kg (230 lb 4.8 oz)    History of present illness:  As per Dr. Myna Hidalgo on 4/4: Gary Carroll is a 70 y.o. male with PMH of hypertension, hyperlipidemia, and chronic bilateral knee pain secondary to osteoarthritis who presents to the ED at the direction of his urologist for evaluation of excessive diuresis. Patient was admitted to the hospital on 06/19/2015 for elective total right knee arthroplasty. He tolerated the procedure well with no immediate complications but did develop some urinary retention postoperatively. He was managed with Flomax but has continued to retain urine. He was evaluated by urology and underwent renal ultrasound yesterday that is demonstrated bilateral hydronephrosis secondary to a bladder outlet obstruction. Foley catheter was placed in the urology office prior to admission and there was immediate diuresis of approximately 2 L. Bag was drained and the patient was told to seek evaluation in the emergency department if the catheter bag was to refill within 6 hours. Patient reports that the bag and become full and  taut within 90 minutes, prompting his presentation to the ED. Patient denies any fevers, chills, abdominal pain, flank pain, nausea, vomiting, diarrhea, or dysuria. He reports feeling quite well at this time, denying chest pain or palpitations. He denies lightheadedness or presyncope.  In ED, patient was found to be afebrile, saturating well on room air, and with vital signs stable. BNP features hypernatremia to 146, and a serum creatinine of 2.1, up from an apparent baseline of 0.8. CBC features a hemoglobin of 10.2, down from an apparent baseline of 13-14. EDP reached the on-call urologist, Dr. Louis Meckel, and the case was discussed. Patient was recommended for admission to the hospital to monitor his electrolytes during this diuresis.   Hospital Course:   Acute renal failure -Likely due to obstructive uropathy, had massive diuresis after placement of Foley catheter and was referred to the emergency department by his urologist for replacement of fluid and electrolytes. -Creatinine has improved to 1. 45. -His ARB will be held upon discharge pending further evaluation by PCP in 2 weeks.  New onset atrial fibrillation -Converted rapidly with several doses of IV metoprolol. -Has a chadsvasc score of at least 2 possibly 4 pending final results of 2-D echo. -Has been started on Eliquis for anticoagulation and metoprolol for rate control. -There are some technical difficulties with downloading his 2-D echo and as such formal interpretation is not yet available, however after discussion with echo tech who in turn discussed with cardiologist Dr. Domenic Polite, ejection fraction appears to be 65% with grade 1 diastolic dysfunction and mild aortic stenosis. Formal echo results will need to be looked at at follow-up appointment.  Bladder outlet obstruction with bilateral hydronephrosis -Has Foley catheter in place, with follow-up with urology as scheduled.  Hypernatremia -Resolved. Likely due to massive fluid  shifts after placement of Foley catheter. -Resolved with IV fluids.    Procedures:  None   Consultations:  None  Discharge Instructions  Discharge Instructions    Diet - low sodium heart healthy    Complete by:  As directed      Increase activity slowly    Complete by:  As directed             Medication List    STOP taking these medications        antiseptic oral rinse Liqd     ASPIR-81 81 MG EC tablet  Generic drug:  aspirin     losartan 100 MG tablet  Commonly known as:  COZAAR      TAKE these medications        acetaminophen 325 MG tablet  Commonly known as:  TYLENOL  Take 2 tablets (650 mg total) by mouth every 6 (six) hours as needed for mild pain (or Fever >/= 101).     apixaban 5 MG Tabs tablet  Commonly known as:  ELIQUIS  Take 1 tablet (5 mg total) by mouth 2 (two) times daily.     diphenhydrAMINE 25 MG tablet  Commonly known as:  BENADRYL  Take 50 mg by mouth at bedtime as needed for sleep.     docusate sodium 100 MG capsule  Commonly known as:  COLACE  Take 1 capsule (100 mg total) by mouth 2 (two) times daily.     ferrous sulfate 325 (65 FE) MG tablet  Take 325 mg by mouth daily.     Fish Oil 1200 MG Caps  Take 2 capsules by mouth 2 (two) times daily.     Melatonin 5 MG Tabs  Take 5 mg by mouth at bedtime.     metoCLOPramide 5 MG tablet  Commonly known as:  REGLAN  Take 1-2 tablets (5-10 mg total) by mouth every 8 (eight) hours as needed for nausea (if ondansetron (ZOFRAN) ineffective.).     metoprolol tartrate 25 MG tablet  Commonly known as:  LOPRESSOR  Take 1 tablet (25 mg total) by mouth 2 (two) times daily.     MULTIVITAL tablet  Take 1 tablet by mouth daily.     pravastatin 40 MG tablet  Commonly known as:  PRAVACHOL  Take 1 tablet (40 mg total) by mouth daily.     sodium chloride 0.65 % Soln nasal spray  Commonly known as:  OCEAN  Place 1 spray into both nostrils as needed for congestion.     sodium phosphate 7-19  GM/118ML Enem  Place 133 mLs (1 enema total) rectally daily as needed for severe constipation.     tamsulosin 0.4 MG Caps capsule  Commonly known as:  FLOMAX  Take 0.4 mg by mouth every evening.       Allergies  Allergen Reactions  . Diovan [Valsartan] Cough  . Tagamet [Cimetidine] Other (See Comments)    irrittability       Follow-up Information    Follow up with Wende Neighbors, MD. Schedule an appointment as soon as possible for a visit in 2 weeks.   Specialty:  Internal Medicine   Contact information:   [place] [id number] [phone]        The results of significant diagnostics from this hospitalization (including imaging, microbiology, ancillary and laboratory)  are listed below for reference.    Significant Diagnostic Studies: US Renal  08/21/2015  CLINICAL DATA:  Elevated serum creatinine EXAM: RENAL / URINARY TRACT ULTRASOUND COMPLETE COMPARISON:  None. FINDINGS: Right Kidney: Length: 13 cm. Moderate hydronephrosis with mild improvement after voiding. Normal echogenicity. No cortical thinning. No mass. Left Kidney: Length: 13 cm. Moderate hydronephrosis with mild improvement after voiding. Normal echogenicity. No cortical thinning. No mass. Bladder: Dilated bladder which limits complete visualization. Pre and postvoid volume is 2400 and 2200cc respectively. No thickening or debris identified. These results will be called to the ordering clinician or representative by the Radiologist Assistant, and communication documented in the PACS or zVision Dashboard. IMPRESSION: Bladder outlet obstruction with bilateral hydronephrosis. Electronically Signed   By: Monte Fantasia M.D.   On: 08/21/2015 12:24    Microbiology: No results found for this or any previous visit (from the past 240 hour(s)).   Labs: Basic Metabolic Panel:  Recent Labs Lab 08/22/15 1725 08/23/15 0013 08/23/15 0554 08/24/15 0603  NA 146* 145 145 144  K 3.9 3.8 3.5 3.3*  CL 111 111 110  110  CO2 25 24 24 23   GLUCOSE 106* 156* 107* 109*  BUN 34* 32* 30* 23*  CREATININE 2.10* 2.07* 1.89* 1.48*  CALCIUM 9.6 9.5 9.2 8.9  MG  --  1.7  --  1.6*   Liver Function Tests: No results for input(s): AST, ALT, ALKPHOS, BILITOT, PROT, ALBUMIN in the last 168 hours. No results for input(s): LIPASE, AMYLASE in the last 168 hours. No results for input(s): AMMONIA in the last 168 hours. CBC:  Recent Labs Lab 08/22/15 1725 08/23/15 0013  WBC 4.8  --   NEUTROABS 2.7  --   HGB 10.2* 10.8*  HCT 31.4* 33.5*  MCV 91.5  --   PLT 195  --    Cardiac Enzymes:  Recent Labs Lab 08/24/15 0603  TROPONINI <0.03   BNP: BNP (last 3 results) No results for input(s): BNP in the last 8760 hours.  ProBNP (last 3 results) No results for input(s): PROBNP in the last 8760 hours.  CBG: No results for input(s): GLUCAP in the last 168 hours.     SignedLelon Frohlich  [facility] Pager: [phone] 08/24/2015, 5:55 PM

## 2015-08-24 NOTE — Progress Notes (Signed)
ANTICOAGULATION CONSULT NOTE - Initial Consult  Pharmacy Consult for [facility] Indication: atrial fibrillation  Allergies  Allergen Reactions  . Diovan [Valsartan] Cough  . Tagamet [Cimetidine] Other (See Comments)    irrittability   Patient Measurements: Height: 6' (182.9 cm) Weight: 230 lb 4.8 oz (104.463 kg) IBW/kg (Calculated) : 77.6  Vital Signs: Temp: 98.8 F (37.1 C) (04/06 0348) Temp Source: Oral (04/06 0348) BP: 131/88 mmHg (04/06 0555) Pulse Rate: 72 (04/06 0348)  Labs:  Recent Labs  08/22/15 1725 08/23/15 0013 08/23/15 0554 08/24/15 0603  HGB 10.2* 10.8*  --   --   HCT 31.4* 33.5*  --   --   PLT 195  --   --   --   APTT  --  30  --   --   LABPROT  --  15.8*  --   --   INR  --  1.25  --   --   CREATININE 2.10* 2.07* 1.89* 1.48*  TROPONINI  --   --   --  <0.03   Estimated Creatinine Clearance: 58.1 mL/min (by C-G formula based on Cr of 1.48).  Medical History: Past Medical History  Diagnosis Date  . Bilateral knee pain     OA  . Prediabetes   . Allergic rhinitis   . Hiatal hernia   . Heart murmur   . HLD (hyperlipidemia)   . HTN (hypertension)   . Erectile dysfunction   . Arthritis   . Asthma     seasonal per pt  . GERD (gastroesophageal reflux disease)   . Diverticulosis 02/2012    seen on colonoscopy  . Urinary frequency   . Overactive bladder   . Urinary urgency   . Wears glasses   . Ringing of ears   . Hydronephrosis determined by ultrasound 08/22/2015   Assessment: 70YO male to be started on Eliquis for afib.  Goal of Therapy:  Systemic anticoagulation Monitor platelets by anticoagulation protocol: Yes   Plan:  Eliquis 5mg  PO bid Provide education and information Monitor for s/sx of bleeding  Hart Robinsons A 08/24/2015,11:44 AM

## 2015-08-24 NOTE — Progress Notes (Signed)
Hospitalist Progress Note  Called to patient bedside for tachycardia.  Chart review shows that he is admitted for AKI from obstructive uropathy. Diuresing for 2 days.  K yesterday normal.  ECG ordered, shows Afib, rate 160s, no ischemic changes.  Patient denies previous history of Afib.  Has no chest pain, dyspnea, nausea, diaphoresis.  Feels normal self.    BP 133/71 mmHg  Pulse 72  Temp(Src) 98.8 F (37.1 C) (Oral)  Resp 20  Ht 6' (1.829 m)  Wt 104.463 kg (230 lb 4.8 oz)  BMI 31.23 kg/m2  SpO2 97%  Gen oriented and mentating well.  Cor irregularly irregular, no murmurs.  Lungs clear.  Skin without mottling.  Peripheral pulses good.    New onset Afib  Blood pressure good, will give metoprolol 5 mg IV now, repeat twice as needed to get HR < 110 Check magnesium, K, and troponin now

## 2015-08-25 ENCOUNTER — Encounter (HOSPITAL_COMMUNITY): Payer: Medicare Other

## 2015-08-25 LAB — ECHOCARDIOGRAM COMPLETE
Height: 72 in
WEIGHTICAEL: 3684.8 [oz_av]

## 2015-08-29 ENCOUNTER — Encounter (HOSPITAL_COMMUNITY): Payer: Medicare Other

## 2015-09-22 ENCOUNTER — Ambulatory Visit (INDEPENDENT_AMBULATORY_CARE_PROVIDER_SITE_OTHER): Payer: Commercial Managed Care - HMO | Admitting: Cardiovascular Disease

## 2015-09-22 ENCOUNTER — Encounter: Payer: Self-pay | Admitting: Cardiovascular Disease

## 2015-09-22 VITALS — BP 128/50 | HR 71 | Ht 72.0 in | Wt 242.0 lb

## 2015-09-22 DIAGNOSIS — Z87898 Personal history of other specified conditions: Secondary | ICD-10-CM

## 2015-09-22 DIAGNOSIS — I10 Essential (primary) hypertension: Secondary | ICD-10-CM

## 2015-09-22 DIAGNOSIS — Z9289 Personal history of other medical treatment: Secondary | ICD-10-CM

## 2015-09-22 DIAGNOSIS — I480 Paroxysmal atrial fibrillation: Secondary | ICD-10-CM | POA: Diagnosis not present

## 2015-09-22 DIAGNOSIS — I350 Nonrheumatic aortic (valve) stenosis: Secondary | ICD-10-CM

## 2015-09-22 NOTE — Progress Notes (Signed)
Patient ID: Gary Carroll, male   DOB: January 16, 1946, 70 y.o.   MRN: [id number]       CARDIOLOGY CONSULT NOTE  Patient ID: GARISON Carroll MRN: [id number] DOB/AGE: July 26, 1945 70 y.o.  Admit date: (Not on file) Primary Physician: Wende Neighbors, MD Referring Physician: Nevada Crane MD  Reason for Consultation: atrial fibrillation  HPI: The patient is a 70 year old male who was hospitalized in April 2017 for bladder outlet obstruction resulting in bilateral hydronephrosis. He had consequent acute on chronic renal failure. He reportedly developed new onset atrial fibrillation which converted to sinus rhythm after several doses of intravenous metoprolol. He was started on Eliquis for anticoagulation and metoprolol for rate control. He was hypernatremic as well.  Echocardiogram 08/24/15: Normal left ventricular systolic function, LVEF [id number], mild LVH, grade 1 diastolic dysfunction, mild to moderate calcific aortic stenosis with mild aortic regurgitation.  He said he wasn't aware he was in rapid atrial fibrillation while hospitalized. Michela Pitcher he has a tendency for rapid heart rate dating back at least 10-15 years. He said he has been on metoprolol for this reason because he had a resting heart rate in the low 100 bpm range. Sometimes has right-sided chest discomfort which is nonexertional. Denies orthopnea and palpitations.  Labs: 09/12/15 hemoglobin 11.9, BUN 22, creatinine 1.14.   Allergies  Allergen Reactions  . Diovan [Valsartan] Cough  . Tagamet [Cimetidine] Other (See Comments)    irrittability    Current Outpatient Prescriptions  Medication Sig Dispense Refill  . apixaban (ELIQUIS) 5 MG TABS tablet Take 1 tablet (5 mg total) by mouth 2 (two) times daily. 60 tablet 2  . diphenhydrAMINE (BENADRYL) 25 MG tablet Take 50 mg by mouth at bedtime as needed for sleep.    Marland Kitchen docusate sodium (COLACE) 100 MG capsule Take 1 capsule (100 mg total) by mouth 2 (two) times daily. (Patient taking differently:  Take 100 mg by mouth daily as needed for mild constipation or moderate constipation. ) 40 capsule 0  . ferrous sulfate 325 (65 FE) MG tablet Take 325 mg by mouth daily.  2  . Melatonin 5 MG TABS Take 5 mg by mouth at bedtime.    . metoCLOPramide (REGLAN) 5 MG tablet Take 1-2 tablets (5-10 mg total) by mouth every 8 (eight) hours as needed for nausea (if ondansetron (ZOFRAN) ineffective.). 30 tablet 1  . metoprolol tartrate (LOPRESSOR) 25 MG tablet Take 1 tablet (25 mg total) by mouth 2 (two) times daily. 60 tablet 2  . Multiple Vitamins-Minerals (MULTIVITAL) tablet Take 1 tablet by mouth daily.      . Omega-3 Fatty Acids (FISH OIL) 1200 MG CAPS Take 2 capsules by mouth 2 (two) times daily.    . pravastatin (PRAVACHOL) 40 MG tablet Take 1 tablet (40 mg total) by mouth daily. 90 tablet 0  . sodium chloride (OCEAN) 0.65 % SOLN nasal spray Place 1 spray into both nostrils as needed for congestion.    . tamsulosin (FLOMAX) 0.4 MG CAPS capsule Take 0.4 mg by mouth every evening.     No current facility-administered medications for this visit.    Past Medical History  Diagnosis Date  . Bilateral knee pain     OA  . Prediabetes   . Allergic rhinitis   . Hiatal hernia   . Heart murmur   . HLD (hyperlipidemia)   . HTN (hypertension)   . Erectile dysfunction   . Arthritis   . Asthma     seasonal per pt  . GERD (gastroesophageal  reflux disease)   . Diverticulosis 02/2012    seen on colonoscopy  . Urinary frequency   . Overactive bladder   . Urinary urgency   . Wears glasses   . Ringing of ears   . Hydronephrosis determined by ultrasound 08/22/2015    Past Surgical History  Procedure Laterality Date  . Tonsillectomy    . Finger surgery Right   . Total knee arthroplasty  2012    left (Dr. Lorre Nick)  . Cholecystectomy    . Colonoscopy  02/2012    diverticulosis; Dr. Deatra Ina  . Total knee arthroplasty Right 06/19/2015    Procedure: TOTAL KNEE ARTHROPLASTY;  Surgeon: Vickey Huger, MD;   Location: [facility];  Service: Orthopedics;  Laterality: Right;    Social History   Social History  . Marital Status: Divorced    Spouse Name: N/A  . Number of Children: 0  . Years of Education: N/A   Occupational History  . retired [employer])    Social History Main Topics  . Smoking status: Former Smoker    Types: Cigarettes    Quit date: 01/18/2010  . Smokeless tobacco: Never Used     Comment: Smoked for 25 years- quit September 2011.  Passive exposure from wife  . Alcohol Use: 0.6 - 1.2 oz/week    1-2 Glasses of wine per week     Comment: drinks a 1-2 glasses of wine daily  . Drug Use: No  . Sexual Activity: Not Currently   Other Topics Concern  . Not on file   Social History Narrative   Separated from wife (2014) and moved to [place]. Retired- worked in Theatre manager.      No family history of premature CAD in 1st degree relatives.  Prior to Admission medications   Medication Sig Start Date End Date Taking? Authorizing Provider  apixaban (ELIQUIS) 5 MG TABS tablet Take 1 tablet (5 mg total) by mouth 2 (two) times daily. 08/24/15  Yes Estela Leonie Green, MD  diphenhydrAMINE (BENADRYL) 25 MG tablet Take 50 mg by mouth at bedtime as needed for sleep.   Yes Historical Provider, MD  docusate sodium (COLACE) 100 MG capsule Take 1 capsule (100 mg total) by mouth 2 (two) times daily. Patient taking differently: Take 100 mg by mouth daily as needed for mild constipation or moderate constipation.  06/23/15  Yes Carlynn Spry, PA-C  ferrous sulfate 325 (65 FE) MG tablet Take 325 mg by mouth daily. 08/04/15  Yes Historical Provider, MD  Melatonin 5 MG TABS Take 5 mg by mouth at bedtime.   Yes Historical Provider, MD  metoCLOPramide (REGLAN) 5 MG tablet Take 1-2 tablets (5-10 mg total) by mouth every 8 (eight) hours as needed for nausea (if ondansetron (ZOFRAN) ineffective.). 06/20/15  Yes Donia Ast, PA  metoprolol tartrate (LOPRESSOR) 25 MG tablet Take 1 tablet (25 mg  total) by mouth 2 (two) times daily. 08/24/15  Yes Estela Leonie Green, MD  Multiple Vitamins-Minerals (MULTIVITAL) tablet Take 1 tablet by mouth daily.     Yes Historical Provider, MD  Omega-3 Fatty Acids (FISH OIL) 1200 MG CAPS Take 2 capsules by mouth 2 (two) times daily.   Yes Historical Provider, MD  pravastatin (PRAVACHOL) 40 MG tablet Take 1 tablet (40 mg total) by mouth daily. 09/16/12  Yes Rita Ohara, MD  sodium chloride (OCEAN) 0.65 % SOLN nasal spray Place 1 spray into both nostrils as needed for congestion.   Yes Historical Provider, MD  tamsulosin (FLOMAX) 0.4 MG CAPS  capsule Take 0.4 mg by mouth every evening.   Yes Historical Provider, MD     Review of systems complete and found to be negative unless listed above in HPI     Physical exam Blood pressure 128/50, pulse 71, height 6' (1.829 m), weight 242 lb (109.77 kg), SpO2 95 %. General: NAD Neck: No JVD, no thyromegaly or thyroid nodule.  Lungs: Clear to auscultation bilaterally with normal respiratory effort. CV: Nondisplaced PMI. Regular rate and rhythm, normal S1/S2, no S3/S4, 2/6 ejection systolic murmur over RUSB.  No peripheral edema.  No carotid bruit.  Abdomen: Soft, obese.  Skin: Intact without lesions or rashes.  Neurologic: Alert and oriented x 3.  Psych: Normal affect. Extremities: No clubbing or cyanosis.  HEENT: Normal.   ECG: Most recent ECG reviewed.  Labs:   Lab Results  Component Value Date   WBC 4.8 08/22/2015   HGB 10.8* 08/23/2015   HCT 33.5* 08/23/2015   MCV 91.5 08/22/2015   PLT 195 08/22/2015   No results for input(s): NA, K, CL, CO2, BUN, CREATININE, CALCIUM, PROT, BILITOT, ALKPHOS, ALT, AST, GLUCOSE in the last 168 hours.  Invalid input(s): LABALBU Lab Results  Component Value Date   TROPONINI <0.03 08/24/2015    Lab Results  Component Value Date   CHOL 139 02/06/2012   CHOL 164 08/05/2011   Lab Results  Component Value Date   HDL 35* 02/06/2012   HDL 34* 08/05/2011    Lab Results  Component Value Date   LDLCALC 86 02/06/2012   LDLCALC 100* 08/05/2011   Lab Results  Component Value Date   TRIG 92 02/06/2012   TRIG 150* 08/05/2011   Lab Results  Component Value Date   CHOLHDL 4.0 02/06/2012   CHOLHDL 4.8 08/05/2011   No results found for: LDLDIRECT       Studies: No results found.  ASSESSMENT AND PLAN:  1. Paroxysmal atrial fibrillation: CHA2DS2Vasc score 2 (age, hypertension) thus anticoagulation is recommended to reduce thromboembolic risk. Will continue Eliquis and utilize metoprolol for rate control. Labs reviewed above.  2. Aortic stenosis: Mild to moderate. Will repeat echocardiogram in 1.5-2 years.  3. Essential HTN: Controlled. No changes.  Dispo: fu 6 months.   Signed: Kate Sable, M.D., F.A.C.C.  09/22/2015, 1:38 PM

## 2015-09-22 NOTE — Patient Instructions (Signed)
Your physician wants you to follow-up in: 6 months You will receive a reminder letter in the mail two months in advance. If you don't receive a letter, please call our office to schedule the follow-up appointment.     Your physician recommends that you continue on your current medications as directed. Please refer to the Current Medication list given to you today.      Thank you for choosing [facility] !        

## 2016-05-22 DIAGNOSIS — I10 Essential (primary) hypertension: Secondary | ICD-10-CM | POA: Diagnosis not present

## 2016-05-22 DIAGNOSIS — R7301 Impaired fasting glucose: Secondary | ICD-10-CM | POA: Diagnosis not present

## 2016-05-22 DIAGNOSIS — E782 Mixed hyperlipidemia: Secondary | ICD-10-CM | POA: Diagnosis not present

## 2016-05-24 DIAGNOSIS — R339 Retention of urine, unspecified: Secondary | ICD-10-CM | POA: Diagnosis not present

## 2016-05-24 DIAGNOSIS — Z0001 Encounter for general adult medical examination with abnormal findings: Secondary | ICD-10-CM | POA: Diagnosis not present

## 2016-05-24 DIAGNOSIS — D509 Iron deficiency anemia, unspecified: Secondary | ICD-10-CM | POA: Diagnosis not present

## 2016-05-24 DIAGNOSIS — I482 Chronic atrial fibrillation: Secondary | ICD-10-CM | POA: Diagnosis not present

## 2016-05-24 DIAGNOSIS — Z6836 Body mass index (BMI) 36.0-36.9, adult: Secondary | ICD-10-CM | POA: Diagnosis not present

## 2016-05-24 DIAGNOSIS — R944 Abnormal results of kidney function studies: Secondary | ICD-10-CM | POA: Diagnosis not present

## 2016-05-24 DIAGNOSIS — I10 Essential (primary) hypertension: Secondary | ICD-10-CM | POA: Diagnosis not present

## 2016-05-24 DIAGNOSIS — R7301 Impaired fasting glucose: Secondary | ICD-10-CM | POA: Diagnosis not present

## 2016-06-10 DIAGNOSIS — R1010 Upper abdominal pain, unspecified: Secondary | ICD-10-CM | POA: Diagnosis not present

## 2016-06-10 DIAGNOSIS — R0782 Intercostal pain: Secondary | ICD-10-CM | POA: Diagnosis not present

## 2016-06-10 DIAGNOSIS — G2581 Restless legs syndrome: Secondary | ICD-10-CM | POA: Diagnosis not present

## 2016-07-01 DIAGNOSIS — J060 Acute laryngopharyngitis: Secondary | ICD-10-CM | POA: Diagnosis not present

## 2016-07-01 DIAGNOSIS — R05 Cough: Secondary | ICD-10-CM | POA: Diagnosis not present

## 2016-07-01 DIAGNOSIS — Z6836 Body mass index (BMI) 36.0-36.9, adult: Secondary | ICD-10-CM | POA: Diagnosis not present

## 2016-10-07 DIAGNOSIS — J060 Acute laryngopharyngitis: Secondary | ICD-10-CM | POA: Diagnosis not present

## 2016-10-07 DIAGNOSIS — Z6836 Body mass index (BMI) 36.0-36.9, adult: Secondary | ICD-10-CM | POA: Diagnosis not present

## 2016-11-16 DIAGNOSIS — N390 Urinary tract infection, site not specified: Secondary | ICD-10-CM | POA: Diagnosis not present

## 2016-11-16 DIAGNOSIS — Z6836 Body mass index (BMI) 36.0-36.9, adult: Secondary | ICD-10-CM | POA: Diagnosis not present

## 2016-11-16 DIAGNOSIS — K625 Hemorrhage of anus and rectum: Secondary | ICD-10-CM | POA: Diagnosis not present

## 2016-11-19 DIAGNOSIS — I10 Essential (primary) hypertension: Secondary | ICD-10-CM | POA: Diagnosis not present

## 2016-11-19 DIAGNOSIS — Z1159 Encounter for screening for other viral diseases: Secondary | ICD-10-CM | POA: Diagnosis not present

## 2016-11-19 DIAGNOSIS — N400 Enlarged prostate without lower urinary tract symptoms: Secondary | ICD-10-CM | POA: Diagnosis not present

## 2016-11-19 DIAGNOSIS — R7301 Impaired fasting glucose: Secondary | ICD-10-CM | POA: Diagnosis not present

## 2016-11-22 DIAGNOSIS — D509 Iron deficiency anemia, unspecified: Secondary | ICD-10-CM | POA: Diagnosis not present

## 2016-11-22 DIAGNOSIS — R7301 Impaired fasting glucose: Secondary | ICD-10-CM | POA: Diagnosis not present

## 2016-11-22 DIAGNOSIS — R944 Abnormal results of kidney function studies: Secondary | ICD-10-CM | POA: Diagnosis not present

## 2016-11-22 DIAGNOSIS — R339 Retention of urine, unspecified: Secondary | ICD-10-CM | POA: Diagnosis not present

## 2016-11-22 DIAGNOSIS — I482 Chronic atrial fibrillation: Secondary | ICD-10-CM | POA: Diagnosis not present

## 2016-11-22 DIAGNOSIS — Z6835 Body mass index (BMI) 35.0-35.9, adult: Secondary | ICD-10-CM | POA: Diagnosis not present

## 2016-11-22 DIAGNOSIS — I10 Essential (primary) hypertension: Secondary | ICD-10-CM | POA: Diagnosis not present

## 2016-11-22 DIAGNOSIS — G9009 Other idiopathic peripheral autonomic neuropathy: Secondary | ICD-10-CM | POA: Diagnosis not present

## 2016-11-26 DIAGNOSIS — G4700 Insomnia, unspecified: Secondary | ICD-10-CM | POA: Diagnosis not present

## 2016-11-26 DIAGNOSIS — Z0000 Encounter for general adult medical examination without abnormal findings: Secondary | ICD-10-CM | POA: Diagnosis not present

## 2016-11-26 DIAGNOSIS — E7800 Pure hypercholesterolemia, unspecified: Secondary | ICD-10-CM | POA: Diagnosis not present

## 2016-11-26 DIAGNOSIS — R600 Localized edema: Secondary | ICD-10-CM | POA: Diagnosis not present

## 2016-11-26 DIAGNOSIS — G4730 Sleep apnea, unspecified: Secondary | ICD-10-CM | POA: Diagnosis not present

## 2016-11-26 DIAGNOSIS — I10 Essential (primary) hypertension: Secondary | ICD-10-CM | POA: Diagnosis not present

## 2016-11-26 DIAGNOSIS — D509 Iron deficiency anemia, unspecified: Secondary | ICD-10-CM | POA: Diagnosis not present

## 2016-11-26 DIAGNOSIS — G2581 Restless legs syndrome: Secondary | ICD-10-CM | POA: Diagnosis not present

## 2016-11-26 DIAGNOSIS — I482 Chronic atrial fibrillation: Secondary | ICD-10-CM | POA: Diagnosis not present

## 2016-11-26 DIAGNOSIS — H9313 Tinnitus, bilateral: Secondary | ICD-10-CM | POA: Diagnosis not present

## 2016-11-26 DIAGNOSIS — E669 Obesity, unspecified: Secondary | ICD-10-CM | POA: Diagnosis not present

## 2016-12-31 DIAGNOSIS — R252 Cramp and spasm: Secondary | ICD-10-CM | POA: Diagnosis not present

## 2016-12-31 DIAGNOSIS — I10 Essential (primary) hypertension: Secondary | ICD-10-CM | POA: Diagnosis not present

## 2016-12-31 DIAGNOSIS — R600 Localized edema: Secondary | ICD-10-CM | POA: Diagnosis not present

## 2016-12-31 DIAGNOSIS — G9009 Other idiopathic peripheral autonomic neuropathy: Secondary | ICD-10-CM | POA: Diagnosis not present

## 2016-12-31 DIAGNOSIS — I482 Chronic atrial fibrillation: Secondary | ICD-10-CM | POA: Diagnosis not present

## 2016-12-31 DIAGNOSIS — I350 Nonrheumatic aortic (valve) stenosis: Secondary | ICD-10-CM | POA: Diagnosis not present

## 2016-12-31 DIAGNOSIS — Z6835 Body mass index (BMI) 35.0-35.9, adult: Secondary | ICD-10-CM | POA: Diagnosis not present

## 2016-12-31 DIAGNOSIS — N400 Enlarged prostate without lower urinary tract symptoms: Secondary | ICD-10-CM | POA: Diagnosis not present

## 2016-12-31 DIAGNOSIS — K649 Unspecified hemorrhoids: Secondary | ICD-10-CM | POA: Diagnosis not present

## 2017-01-16 DIAGNOSIS — N390 Urinary tract infection, site not specified: Secondary | ICD-10-CM | POA: Diagnosis not present

## 2017-01-16 DIAGNOSIS — Z6836 Body mass index (BMI) 36.0-36.9, adult: Secondary | ICD-10-CM | POA: Diagnosis not present

## 2017-01-24 DIAGNOSIS — Z6835 Body mass index (BMI) 35.0-35.9, adult: Secondary | ICD-10-CM | POA: Diagnosis not present

## 2017-01-24 DIAGNOSIS — N390 Urinary tract infection, site not specified: Secondary | ICD-10-CM | POA: Diagnosis not present

## 2017-02-03 ENCOUNTER — Encounter (HOSPITAL_COMMUNITY): Payer: Self-pay | Admitting: Emergency Medicine

## 2017-02-03 ENCOUNTER — Emergency Department (HOSPITAL_COMMUNITY): Payer: Medicare HMO

## 2017-02-03 ENCOUNTER — Inpatient Hospital Stay (HOSPITAL_COMMUNITY)
Admission: EM | Admit: 2017-02-03 | Discharge: 2017-02-09 | DRG: 872 | Disposition: A | Discharge status: Home / Self Care | Payer: Medicare HMO | Attending: Internal Medicine | Admitting: Internal Medicine

## 2017-02-03 DIAGNOSIS — Z7901 Long term (current) use of anticoagulants: Secondary | ICD-10-CM

## 2017-02-03 DIAGNOSIS — I480 Paroxysmal atrial fibrillation: Secondary | ICD-10-CM | POA: Diagnosis not present

## 2017-02-03 DIAGNOSIS — N390 Urinary tract infection, site not specified: Secondary | ICD-10-CM | POA: Diagnosis present

## 2017-02-03 DIAGNOSIS — Z888 Allergy status to other drugs, medicaments and biological substances status: Secondary | ICD-10-CM | POA: Diagnosis not present

## 2017-02-03 DIAGNOSIS — Z8249 Family history of ischemic heart disease and other diseases of the circulatory system: Secondary | ICD-10-CM | POA: Diagnosis not present

## 2017-02-03 DIAGNOSIS — K219 Gastro-esophageal reflux disease without esophagitis: Secondary | ICD-10-CM | POA: Diagnosis present

## 2017-02-03 DIAGNOSIS — A409 Streptococcal sepsis, unspecified: Secondary | ICD-10-CM | POA: Diagnosis not present

## 2017-02-03 DIAGNOSIS — I5032 Chronic diastolic (congestive) heart failure: Secondary | ICD-10-CM | POA: Diagnosis present

## 2017-02-03 DIAGNOSIS — E785 Hyperlipidemia, unspecified: Secondary | ICD-10-CM | POA: Diagnosis present

## 2017-02-03 DIAGNOSIS — I10 Essential (primary) hypertension: Secondary | ICD-10-CM | POA: Diagnosis not present

## 2017-02-03 DIAGNOSIS — L03116 Cellulitis of left lower limb: Secondary | ICD-10-CM | POA: Diagnosis not present

## 2017-02-03 DIAGNOSIS — R059 Cough, unspecified: Secondary | ICD-10-CM

## 2017-02-03 DIAGNOSIS — I482 Chronic atrial fibrillation: Secondary | ICD-10-CM | POA: Diagnosis present

## 2017-02-03 DIAGNOSIS — G9341 Metabolic encephalopathy: Secondary | ICD-10-CM | POA: Diagnosis present

## 2017-02-03 DIAGNOSIS — N400 Enlarged prostate without lower urinary tract symptoms: Secondary | ICD-10-CM | POA: Diagnosis present

## 2017-02-03 DIAGNOSIS — L0390 Cellulitis, unspecified: Secondary | ICD-10-CM

## 2017-02-03 DIAGNOSIS — Z96651 Presence of right artificial knee joint: Secondary | ICD-10-CM | POA: Diagnosis present

## 2017-02-03 DIAGNOSIS — Z6835 Body mass index (BMI) 35.0-35.9, adult: Secondary | ICD-10-CM | POA: Diagnosis not present

## 2017-02-03 DIAGNOSIS — J302 Other seasonal allergic rhinitis: Secondary | ICD-10-CM | POA: Diagnosis present

## 2017-02-03 DIAGNOSIS — I110 Hypertensive heart disease with heart failure: Secondary | ICD-10-CM | POA: Diagnosis present

## 2017-02-03 DIAGNOSIS — A419 Sepsis, unspecified organism: Secondary | ICD-10-CM | POA: Diagnosis present

## 2017-02-03 DIAGNOSIS — Z87891 Personal history of nicotine dependence: Secondary | ICD-10-CM

## 2017-02-03 DIAGNOSIS — N401 Enlarged prostate with lower urinary tract symptoms: Secondary | ICD-10-CM | POA: Diagnosis present

## 2017-02-03 DIAGNOSIS — R509 Fever, unspecified: Secondary | ICD-10-CM | POA: Diagnosis not present

## 2017-02-03 DIAGNOSIS — R600 Localized edema: Secondary | ICD-10-CM | POA: Diagnosis not present

## 2017-02-03 DIAGNOSIS — A4189 Other specified sepsis: Secondary | ICD-10-CM | POA: Diagnosis present

## 2017-02-03 DIAGNOSIS — E782 Mixed hyperlipidemia: Secondary | ICD-10-CM | POA: Diagnosis present

## 2017-02-03 DIAGNOSIS — R05 Cough: Secondary | ICD-10-CM

## 2017-02-03 DIAGNOSIS — N138 Other obstructive and reflux uropathy: Secondary | ICD-10-CM | POA: Diagnosis present

## 2017-02-03 LAB — URINALYSIS, ROUTINE W REFLEX MICROSCOPIC
Bilirubin Urine: NEGATIVE
GLUCOSE, UA: NEGATIVE mg/dL
HGB URINE DIPSTICK: NEGATIVE
Ketones, ur: NEGATIVE mg/dL
NITRITE: NEGATIVE
PH: 5 (ref 5.0–8.0)
Protein, ur: NEGATIVE mg/dL
SPECIFIC GRAVITY, URINE: 1.017 (ref 1.005–1.030)

## 2017-02-03 LAB — COMPREHENSIVE METABOLIC PANEL
ALBUMIN: 4.1 g/dL (ref 3.5–5.0)
ALK PHOS: 72 U/L (ref 38–126)
ALT: 39 U/L (ref 17–63)
ANION GAP: 9 (ref 5–15)
AST: 34 U/L (ref 15–41)
BUN: 18 mg/dL (ref 6–20)
CALCIUM: 9.5 mg/dL (ref 8.9–10.3)
CO2: 26 mmol/L (ref 22–32)
CREATININE: 0.97 mg/dL (ref 0.61–1.24)
Chloride: 102 mmol/L (ref 101–111)
GFR calc non Af Amer: >60 mL/min (ref >60)
Glucose, Bld: 117 mg/dL — ABNORMAL HIGH (ref 65–99)
Potassium: 3.8 mmol/L (ref 3.5–5.1)
Sodium: 137 mmol/L (ref 135–145)
TOTAL PROTEIN: 7.4 g/dL (ref 6.5–8.1)
Total Bilirubin: 0.8 mg/dL (ref 0.3–1.2)

## 2017-02-03 LAB — PROTIME-INR
INR: 1.14
PROTHROMBIN TIME: 14.5 s (ref 11.4–15.2)

## 2017-02-03 LAB — CBC WITH DIFFERENTIAL/PLATELET
Basophils Absolute: 0 10*3/uL (ref 0.0–0.1)
Basophils Relative: 0 %
EOS ABS: 0.1 10*3/uL (ref 0.0–0.7)
EOS PCT: 1 %
HCT: 46.3 % (ref 39.0–52.0)
Hemoglobin: 15.5 g/dL (ref 13.0–17.0)
LYMPHS ABS: 0.9 10*3/uL (ref 0.7–4.0)
Lymphocytes Relative: 8 %
MCH: 30 pg (ref 26.0–34.0)
MCHC: 33.5 g/dL (ref 30.0–36.0)
MCV: 89.7 fL (ref 78.0–100.0)
MONO ABS: 0.4 10*3/uL (ref 0.1–1.0)
Monocytes Relative: 3 %
Neutro Abs: 10.4 10*3/uL — ABNORMAL HIGH (ref 1.7–7.7)
Neutrophils Relative %: 88 %
PLATELETS: 175 10*3/uL (ref 150–400)
RBC: 5.16 MIL/uL (ref 4.22–5.81)
RDW: 14 % (ref 11.5–15.5)
WBC: 11.7 10*3/uL — ABNORMAL HIGH (ref 4.0–10.5)

## 2017-02-03 LAB — LACTIC ACID, PLASMA: Lactic Acid, Venous: 2.4 mmol/L (ref 0.5–1.9)

## 2017-02-03 LAB — TROPONIN I

## 2017-02-03 LAB — BRAIN NATRIURETIC PEPTIDE: B Natriuretic Peptide: 51 pg/mL (ref 0.0–100.0)

## 2017-02-03 LAB — I-STAT CG4 LACTIC ACID, ED: Lactic Acid, Venous: 2.24 mmol/L (ref 0.5–1.9)

## 2017-02-03 MED ORDER — ACETAMINOPHEN 500 MG PO TABS
1000.0000 mg | ORAL_TABLET | Freq: Once | ORAL | Status: AC
  Administered 2017-02-03: 1000 mg via ORAL
  Filled 2017-02-03: qty 2

## 2017-02-03 MED ORDER — VANCOMYCIN HCL IN DEXTROSE 1-5 GM/200ML-% IV SOLN
1000.0000 mg | Freq: Once | INTRAVENOUS | Status: AC
  Administered 2017-02-03: 1000 mg via INTRAVENOUS
  Filled 2017-02-03: qty 200

## 2017-02-03 MED ORDER — PIPERACILLIN-TAZOBACTAM 3.375 G IVPB 30 MIN
3.3750 g | Freq: Once | INTRAVENOUS | Status: AC
  Administered 2017-02-03: 3.375 g via INTRAVENOUS
  Filled 2017-02-03: qty 50

## 2017-02-03 MED ORDER — SALINE SPRAY 0.65 % NA SOLN
1.0000 | NASAL | Status: DC | PRN
  Administered 2017-02-07: 1 via NASAL
  Filled 2017-02-03: qty 44

## 2017-02-03 MED ORDER — PRAVASTATIN SODIUM 40 MG PO TABS
40.0000 mg | ORAL_TABLET | Freq: Every day | ORAL | Status: DC
  Administered 2017-02-04 – 2017-02-09 (×6): 40 mg via ORAL
  Filled 2017-02-03 (×7): qty 1

## 2017-02-03 MED ORDER — TAMSULOSIN HCL 0.4 MG PO CAPS
0.4000 mg | ORAL_CAPSULE | Freq: Every evening | ORAL | Status: DC
  Administered 2017-02-04 – 2017-02-08 (×5): 0.4 mg via ORAL
  Filled 2017-02-03 (×6): qty 1

## 2017-02-03 MED ORDER — ONDANSETRON HCL 4 MG PO TABS
4.0000 mg | ORAL_TABLET | Freq: Four times a day (QID) | ORAL | Status: DC | PRN
Start: 2017-02-03 — End: 2017-02-05

## 2017-02-03 MED ORDER — ONDANSETRON HCL 4 MG/2ML IJ SOLN
4.0000 mg | Freq: Four times a day (QID) | INTRAMUSCULAR | Status: DC | PRN
  Administered 2017-02-05 (×2): 4 mg via INTRAVENOUS
  Filled 2017-02-03 (×3): qty 2

## 2017-02-03 MED ORDER — ADULT MULTIVITAMIN W/MINERALS CH
1.0000 | ORAL_TABLET | Freq: Every day | ORAL | Status: DC
  Administered 2017-02-04 – 2017-02-09 (×6): 1 via ORAL
  Filled 2017-02-03 (×7): qty 1

## 2017-02-03 MED ORDER — POTASSIUM CHLORIDE IN NACL 20-0.9 MEQ/L-% IV SOLN
INTRAVENOUS | Status: DC
  Administered 2017-02-04: 02:00:00 via INTRAVENOUS

## 2017-02-03 MED ORDER — APIXABAN 5 MG PO TABS
5.0000 mg | ORAL_TABLET | Freq: Two times a day (BID) | ORAL | Status: DC
  Administered 2017-02-04 – 2017-02-09 (×12): 5 mg via ORAL
  Filled 2017-02-03 (×12): qty 1

## 2017-02-03 MED ORDER — DIPHENHYDRAMINE HCL 25 MG PO TABS
50.0000 mg | ORAL_TABLET | Freq: Every evening | ORAL | Status: DC | PRN
  Filled 2017-02-03: qty 2

## 2017-02-03 MED ORDER — METOPROLOL TARTRATE 25 MG PO TABS
25.0000 mg | ORAL_TABLET | Freq: Two times a day (BID) | ORAL | Status: DC
  Administered 2017-02-04 – 2017-02-09 (×11): 25 mg via ORAL
  Filled 2017-02-03 (×11): qty 1

## 2017-02-03 MED ORDER — METOPROLOL TARTRATE 5 MG/5ML IV SOLN
INTRAVENOUS | Status: AC
Start: 2017-02-03 — End: 2017-02-03
  Administered 2017-02-03: 5 mg via INTRAVENOUS
  Filled 2017-02-03: qty 5

## 2017-02-03 MED ORDER — METOPROLOL TARTRATE 5 MG/5ML IV SOLN
5.0000 mg | Freq: Once | INTRAVENOUS | Status: AC
  Administered 2017-02-03: 5 mg via INTRAVENOUS

## 2017-02-03 MED ORDER — FERROUS SULFATE 325 (65 FE) MG PO TABS
325.0000 mg | ORAL_TABLET | Freq: Every day | ORAL | Status: DC
  Administered 2017-02-04 – 2017-02-09 (×6): 325 mg via ORAL
  Filled 2017-02-03 (×7): qty 1

## 2017-02-03 MED ORDER — SODIUM CHLORIDE 0.9 % IV BOLUS (SEPSIS)
2000.0000 mL | Freq: Once | INTRAVENOUS | Status: AC
  Administered 2017-02-04: 2000 mL via INTRAVENOUS

## 2017-02-03 MED ORDER — SODIUM CHLORIDE 0.9 % IV BOLUS (SEPSIS)
2000.0000 mL | Freq: Once | INTRAVENOUS | Status: AC
  Administered 2017-02-03: 2000 mL via INTRAVENOUS

## 2017-02-03 NOTE — ED Notes (Signed)
CRITICAL VALUE ALERT  Critical Value:  Istat Lactic Acid 2.24  Date & Time Notied:  02/03/17 0720  Provider Notified: Dr. Roderic Palau  Orders Received/Actions taken: EDP notified, no further orders given

## 2017-02-03 NOTE — ED Notes (Signed)
Blood cultures x 2 drawn by lab 

## 2017-02-03 NOTE — ED Notes (Signed)
Date and time results received: 02/03/17 11:01 PM   Test: Lactic acid Critical Value: 2.4  Name of Provider Notified: Dr Jeannetta Nap  Orders Received? Or Actions Taken?: Orders Received - See Orders for details Dr Jeannetta Nap notified.

## 2017-02-03 NOTE — ED Notes (Signed)
Spoke with Dr Roderic Palau, xray changed to [facility] being performed now. Verbal order to begin with 55ml bolus NS until chest xray has resulted.

## 2017-02-03 NOTE — ED Notes (Signed)
Blood cultures have not been drawn yet. Lab is aware.

## 2017-02-03 NOTE — H&P (Signed)
History and Physical    Corley Maffeo Scaffidi GQQ:[id number] DOB: 20-Jul-1945 DOA: 02/03/2017  PCP: Celene Squibb, MD   Patient coming from: Home.  I have personally briefly reviewed patient's old medical records in [place]  Chief Complaint:   HPI: Gary Carroll is a 71 y.o. male with medical history significant of Allergic rhinitis, osteoarthritis, asthma, diverticulosis, GERD, hiatal hernia, hyperlipidemia, hypertension, history of hydronephrosis, prediabetes who is coming to the emergency department with complaints of fever of 100.26F at home associated with chills and cough. He also complains of frontal headache, but denies rhinorrhea, sore throat, chest pain, palpitations, dizziness, diaphoresis, orthopnea, abdominal pain, nausea, emesis, diarrhea or constipation. He denies dysuria, flank pain or hematuria, but complains of frequency. He denies polyuria, polydipsia or blurry vision. He has been visible left lower extremity erythema, but denies significant discomfort from it.  ED Course: Initial vital signs temperature 99.11F, pulse 104, respirations 28, blood pressure 185 or 71 mmHg 100% on room air. About an hour after arrival to the emergency department, the patient had a fever of 103F.   His workup showed urinary analysis with microscopic pyuria and rare bacteria. WBC of 11.7 with 88% neutrophils, hemoglobin 15.5 g per the skin platelets 175. PT and INR were normal. His CMP showed a nonfasting glucose of 117 mg/dL, but all other values were normal. Initial lactic acid was 2.24 and follow-up level was 2.4. His chest radiograph did not show any acute cardiopulmonary pathology.  Medications: The patient received 2 L of normal saline IV bolus, acetaminophen 1000 mg by mouth 1 dose, vancomycin and Zosyn IV. I order metoprolol 5 mg IVP 1 dose, since the patient was tachycardic and had not taken his evening metoprolol tartrate 25 mg dose.  Review of Systems: As per HPI otherwise 10  point review of systems negative.    Past Medical History:  Diagnosis Date  . Allergic rhinitis   . Arthritis   . Asthma    seasonal per pt  . Bilateral knee pain    OA  . Diverticulosis 02/2012   seen on colonoscopy  . Erectile dysfunction   . GERD (gastroesophageal reflux disease)   . Heart murmur   . Hiatal hernia   . HLD (hyperlipidemia)   . HTN (hypertension)   . Hydronephrosis determined by ultrasound 08/22/2015  . Overactive bladder   . Prediabetes   . Ringing of ears   . Urinary frequency   . Urinary urgency   . Wears glasses     Past Surgical History:  Procedure Laterality Date  . CHOLECYSTECTOMY    . COLONOSCOPY  02/2012   diverticulosis; Dr. Deatra Ina  . FINGER SURGERY Right   . TONSILLECTOMY    . 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 Lorre Nick)  . TOTAL KNEE ARTHROPLASTY Right 06/19/2015   Procedure: TOTAL KNEE ARTHROPLASTY;  Surgeon: Vickey Huger, MD;  Location: [facility];  Service: Orthopedics;  Laterality: Right;     reports that he quit smoking about 7 years ago. His smoking use included Cigarettes. He has never used smokeless tobacco. He reports that he drinks about 0.6 - 1.2 oz of alcohol per week . He reports that he does not use drugs.  Allergies  Allergen Reactions  . Diovan [Valsartan] Cough  . Tagamet [Cimetidine] Other (See Comments)    irrittability    Family History  Problem Relation Age of Onset  . Uterine cancer Mother   . Cancer Mother  uterine  . Coronary artery disease Father   . Coronary artery disease Paternal Grandfather   . Heart disease Paternal Grandfather   . Colon cancer Neg Hx   . Esophageal cancer Neg Hx   . Stomach cancer Neg Hx   . Rectal cancer Neg Hx     Prior to Admission medications   Medication Sig Start Date End Date Taking? Authorizing Provider  apixaban (ELIQUIS) 5 MG TABS tablet Take 1 tablet (5 mg total) by mouth 2 (two) times daily. 08/24/15  Yes Isaac Bliss, Rayford Halsted, MD  diphenhydrAMINE (BENADRYL)  25 MG tablet Take 50 mg by mouth at bedtime as needed for sleep.   Yes [provider]  ferrous sulfate 325 (65 FE) MG tablet Take 325 mg by mouth daily. 08/04/15  Yes [provider]  Melatonin 5 MG TABS Take 5 mg by mouth at bedtime.   Yes [provider]  metoprolol tartrate (LOPRESSOR) 25 MG tablet Take 1 tablet (25 mg total) by mouth 2 (two) times daily. 08/24/15  Yes Isaac Bliss, Rayford Halsted, MD  Multiple Vitamins-Minerals (MULTIVITAL) tablet Take 1 tablet by mouth daily.     Yes [provider]  Omega-3 Fatty Acids (FISH OIL) 1200 MG CAPS Take 2 capsules by mouth 2 (two) times daily.   Yes [provider]  pravastatin (PRAVACHOL) 40 MG tablet Take 1 tablet (40 mg total) by mouth daily. 09/16/12  Yes Rita Ohara, MD  sodium chloride (OCEAN) 0.65 % SOLN nasal spray Place 1 spray into both nostrils as needed for congestion.   Yes [provider]  tamsulosin (FLOMAX) 0.4 MG CAPS capsule Take 0.4 mg by mouth every evening.   Yes [provider]    Physical Exam: Vitals:   02/03/17 1834 02/03/17 1955 02/03/17 2030 02/03/17 2100  BP:  (!) 173/84 (!) 175/83 (!) 154/80  Pulse:  (!) 106 (!) 117 (!) 114  Resp:  (!) 24 (!) 25 (!) 25  Temp:  (!) 103 F (39.4 C)    TempSrc:  Oral    SpO2:  93% 92% 93%  Weight: 117.9 kg (260 lb)     Height: 6' (1.829 m)       Constitutional: NAD, calm, comfortable Eyes: PERRL, lids and conjunctivae normal ENMT: Mucous membranes are dry. Posterior pharynx clear of any exudate or lesions. Neck: normal, supple, no masses, no thyromegaly Respiratory: clear to auscultation bilaterally, no wheezing, no crackles. Normal respiratory effort. No accessory muscle use.  Cardiovascular: Regular rhythm, tachycardic at 107 BPM, positive 2/6 systolic ejection murmur, no rubs / gallops. Bilateral lower extremity edema. 2+ pedal pulses. No carotid bruits.  Abdomen: Obese, soft, no tenderness, no masses palpated. No  hepatosplenomegaly. Bowel sounds positive.  Musculoskeletal: no clubbing / cyanosis.  Good ROM, no contractures. Normal muscle tone.  Skin: Left lower extremity erythema, calor and tenderness on palpation. Neurologic: CN 2-12 grossly intact. Sensation intact, DTR normal. Strength 5/5 in all 4.  Psychiatric: Normal judgment and insight. Alert and oriented x 4. Normal mood.    Labs on Admission: I have personally reviewed following labs and imaging studies  CBC:  Recent Labs Lab 02/03/17 1919  WBC 11.7*  NEUTROABS 10.4*  HGB 15.5  HCT 46.3  MCV 89.7  PLT 383   Basic Metabolic Panel:  Recent Labs Lab 02/03/17 1919  NA 137  K 3.8  CL 102  CO2 26  GLUCOSE 117*  BUN 18  CREATININE 0.97  CALCIUM 9.5   GFR: Estimated Creatinine  Clearance: 92.6 mL/min (by C-G formula based on SCr of 0.97 mg/dL). Liver Function Tests:  Recent Labs Lab 02/03/17 1919  AST 34  ALT 39  ALKPHOS 72  BILITOT 0.8  PROT 7.4  ALBUMIN 4.1   No results for input(s): LIPASE, AMYLASE in the last 168 hours. No results for input(s): AMMONIA in the last 168 hours. Coagulation Profile:  Recent Labs Lab 02/03/17 1919  INR 1.14   Cardiac Enzymes: No results for input(s): CKTOTAL, CKMB, CKMBINDEX, TROPONINI in the last 168 hours. BNP (last 3 results) No results for input(s): PROBNP in the last 8760 hours. HbA1C: No results for input(s): HGBA1C in the last 72 hours. CBG: No results for input(s): GLUCAP in the last 168 hours. Lipid Profile: No results for input(s): CHOL, HDL, LDLCALC, TRIG, CHOLHDL, LDLDIRECT in the last 72 hours. Thyroid Function Tests: No results for input(s): TSH, T4TOTAL, FREET4, T3FREE, THYROIDAB in the last 72 hours. Anemia Panel: No results for input(s): VITAMINB12, FOLATE, FERRITIN, TIBC, IRON, RETICCTPCT in the last 72 hours. Urine analysis:    Component Value Date/Time   COLORURINE YELLOW 02/03/2017 1845   APPEARANCEUR HAZY (A) 02/03/2017 1845   LABSPEC 1.017  02/03/2017 1845   PHURINE 5.0 02/03/2017 1845   GLUCOSEU NEGATIVE 02/03/2017 1845   HGBUR NEGATIVE 02/03/2017 1845   BILIRUBINUR NEGATIVE 02/03/2017 1845   BILIRUBINUR neg 08/31/2012 1203   KETONESUR NEGATIVE 02/03/2017 1845   PROTEINUR NEGATIVE 02/03/2017 1845   UROBILINOGEN negative 08/31/2012 1203   UROBILINOGEN 0.2 05/31/2010 1125   NITRITE NEGATIVE 02/03/2017 1845   LEUKOCYTESUR MODERATE (A) 02/03/2017 1845    Radiological Exams on Admission: Dg Chest Port 1 View  Result Date: 02/03/2017 CLINICAL DATA:  Cough and SOB, Patient complains of fever and chills at home. Patient states temp was 100.6 at home and took some tylenol. Denies pain EXAM: PORTABLE CHEST 1 VIEW COMPARISON:  1/19/7 FINDINGS: Mild enlargement of the cardiopericardial silhouette. No mediastinal or hilar masses. No evidence of adenopathy. Lungs are clear.  No convincing pleural effusion.  No pneumothorax. Skeletal structures are demineralized but grossly intact. IMPRESSION: No acute cardiopulmonary disease. Electronically Signed   By: Lajean Manes M.D.   On: 02/03/2017 20:09   Echocardiogram 08/24/2015  ------------------------------------------------------------------- LV EF: 60% -   65%  ------------------------------------------------------------------- Indications:      Chest pain 786.51.  ------------------------------------------------------------------- History:   Risk factors:  Hypertension.  ------------------------------------------------------------------- Study Conclusions  - Left ventricle: The cavity size was normal. Wall thickness was   increased in a pattern of mild LVH. Systolic function was normal.   The estimated ejection fraction was in the range of 60% to 65%.   Wall motion was normal; there were no regional wall motion   abnormalities. Doppler parameters are consistent with abnormal   left ventricular relaxation (grade 1 diastolic dysfunction). - Aortic valve: Moderately calcified  annulus. Trileaflet;   moderately calcified leaflets. Cusp separation was reduced. There   was mild to moderate stenosis. There was mild regurgitation. Mean   gradient (S): 16 mm Hg. Peak gradient (S): 29 mm Hg. VTI ratio of   LVOT to aortic valve: 0.38. Valve area (Vmax): 1.45 cm^2. - Mitral valve: Calcified annulus. There was trivial regurgitation. - Left atrium: The atrium was mildly dilated. - Right atrium: Central venous pressure (est): 3 mm Hg. - Atrial septum: No defect or patent foramen ovale was identified. - Tricuspid valve: There was trivial regurgitation. - Pericardium, extracardiac: There was no pericardial effusion.  Impressions:  - Mild LVH with  LVEF 60-65%. Grade 1 diastolic dysfunction with   normal estimated LV filling pressure. Mild left atrial   enlargement. MAC with trivial mitral regurgitation. Mild to   moderate calcific aortic stenosis with mild aortic regurgitation.   Trivial tricuspid regurgitation.   EKG: Independently reviewed. Vent. rate 113 BPM PR interval * ms QRS duration 84 ms QT/QTc 313/430 ms P-R-T axes 38 -23 8 Sinus tachycardia Borderline left axis deviation Low voltage, precordial leads Consider anterior infarct  Assessment/Plan Principal Problem:   Sepsis due to cellulitis (HCC)   Cellulitis of left lower extremity Admit to telemetry/inpatient Continue IV fluids. Continue antipyretics asneeded. Continue analgesics as needed. Vancomycin per pharmacy. Zosyn per pharmacy. Follow-up blood cultures and sensitivity. Follow-up lactic acid, CBC and CMP in a.m.  Active Problems:   Urinary tract infection Continue antibiotics as above. Follow-up urine culture and sensitivity.    AF (paroxysmal atrial fibrillation) (HCC) CHA?DS?-VASc Score of at least 4 Continue metoprolol for rate control. Continue Eliquis 5 mg by mouth twice a day. Monitor and optimize electrolytes.    Essential hypertension, benign Continue metoprolol 25 mg by  mouth twice a day. Monitor blood pressure.    Mixed hyperlipidemia Continue pravastatin 40 mg by mouth daily. Monitor LFTs as needed. Fasting lipid profile follow as an outpatient.    GERD (gastroesophageal reflux disease) Protonix 40 mg by mouth daily.    BPH (benign prostatic hyperplasia) Continue Flomax 0.4 mg by mouth daily    DVT prophylaxis: On Apixaban. Code Status: Full code. Family Communication:  Disposition Plan: Admit for IV antibiotics for at least 2-3 days. Consults called:  Admission status: Inpatient/telemetry.   Reubin Milan MD [facility] Pager [phone]. If 7PM-7AM, please contact night-coverage [email or web address] Password [facility]  02/03/2017, 9:16 PM

## 2017-02-03 NOTE — ED Notes (Signed)
CRITICAL VALUE ALERT  Critical Value: Lactic Acid Date & Time Notied:02/03/17 Provider Notified:Dr. Olevia Bowens Orders Received/Actions taken: None Yet

## 2017-02-03 NOTE — ED Triage Notes (Addendum)
Patient complains of fever and chills at home. Patient states temp was 100.6 at home and took some tylenol. Denies pain, but complains of cough.

## 2017-02-04 ENCOUNTER — Inpatient Hospital Stay (HOSPITAL_COMMUNITY): Payer: Medicare HMO

## 2017-02-04 ENCOUNTER — Encounter (HOSPITAL_COMMUNITY): Payer: Self-pay | Admitting: *Deleted

## 2017-02-04 DIAGNOSIS — N401 Enlarged prostate with lower urinary tract symptoms: Secondary | ICD-10-CM | POA: Diagnosis present

## 2017-02-04 DIAGNOSIS — N390 Urinary tract infection, site not specified: Secondary | ICD-10-CM | POA: Diagnosis present

## 2017-02-04 DIAGNOSIS — N400 Enlarged prostate without lower urinary tract symptoms: Secondary | ICD-10-CM | POA: Diagnosis present

## 2017-02-04 DIAGNOSIS — N138 Other obstructive and reflux uropathy: Secondary | ICD-10-CM | POA: Diagnosis present

## 2017-02-04 LAB — BLOOD CULTURE ID PANEL (REFLEXED)
Acinetobacter baumannii: NOT DETECTED
CANDIDA PARAPSILOSIS: NOT DETECTED
CANDIDA TROPICALIS: NOT DETECTED
CARBAPENEM RESISTANCE: NOT DETECTED
Candida albicans: NOT DETECTED
Candida glabrata: NOT DETECTED
Candida krusei: NOT DETECTED
ENTEROCOCCUS SPECIES: NOT DETECTED
Enterobacter cloacae complex: NOT DETECTED
Enterobacteriaceae species: NOT DETECTED
Escherichia coli: NOT DETECTED
Haemophilus influenzae: NOT DETECTED
KLEBSIELLA PNEUMONIAE: NOT DETECTED
Klebsiella oxytoca: NOT DETECTED
LISTERIA MONOCYTOGENES: NOT DETECTED
Methicillin resistance: NOT DETECTED
Neisseria meningitidis: NOT DETECTED
PROTEUS SPECIES: NOT DETECTED
Pseudomonas aeruginosa: NOT DETECTED
SERRATIA MARCESCENS: NOT DETECTED
Staphylococcus aureus (BCID): NOT DETECTED
Staphylococcus species: NOT DETECTED
Streptococcus agalactiae: NOT DETECTED
Streptococcus pneumoniae: NOT DETECTED
Streptococcus pyogenes: NOT DETECTED
Streptococcus species: DETECTED — AB
VANCOMYCIN RESISTANCE: NOT DETECTED

## 2017-02-04 LAB — BASIC METABOLIC PANEL
ANION GAP: 10 (ref 5–15)
BUN: 16 mg/dL (ref 6–20)
CALCIUM: 8.4 mg/dL — AB (ref 8.9–10.3)
CHLORIDE: 107 mmol/L (ref 101–111)
CO2: 21 mmol/L — AB (ref 22–32)
Creatinine, Ser: 0.99 mg/dL (ref 0.61–1.24)
GFR calc non Af Amer: >60 mL/min (ref >60)
Glucose, Bld: 92 mg/dL (ref 65–99)
Potassium: 3.7 mmol/L (ref 3.5–5.1)
SODIUM: 138 mmol/L (ref 135–145)

## 2017-02-04 LAB — CBC WITH DIFFERENTIAL/PLATELET
BASOS ABS: 0 10*3/uL (ref 0.0–0.1)
BASOS PCT: 0 %
Eosinophils Absolute: 0 10*3/uL (ref 0.0–0.7)
Eosinophils Relative: 0 %
HEMATOCRIT: 42.1 % (ref 39.0–52.0)
HEMOGLOBIN: 14.3 g/dL (ref 13.0–17.0)
Lymphocytes Relative: 4 %
Lymphs Abs: 0.7 10*3/uL (ref 0.7–4.0)
MCH: 30.6 pg (ref 26.0–34.0)
MCHC: 34 g/dL (ref 30.0–36.0)
MCV: 90.1 fL (ref 78.0–100.0)
Monocytes Absolute: 0.5 10*3/uL (ref 0.1–1.0)
Monocytes Relative: 3 %
NEUTROS ABS: 15.4 10*3/uL — AB (ref 1.7–7.7)
Neutrophils Relative %: 93 %
Platelets: 147 10*3/uL — ABNORMAL LOW (ref 150–400)
RBC: 4.67 MIL/uL (ref 4.22–5.81)
RDW: 14.2 % (ref 11.5–15.5)
WBC: 16.6 10*3/uL — AB (ref 4.0–10.5)

## 2017-02-04 LAB — MAGNESIUM: MAGNESIUM: 1.6 mg/dL — AB (ref 1.7–2.4)

## 2017-02-04 LAB — LACTIC ACID, PLASMA
LACTIC ACID, VENOUS: 1.8 mmol/L (ref 0.5–1.9)
LACTIC ACID, VENOUS: 2.6 mmol/L — AB (ref 0.5–1.9)
LACTIC ACID, VENOUS: 2.9 mmol/L — AB (ref 0.5–1.9)
LACTIC ACID, VENOUS: 3.1 mmol/L — AB (ref 0.5–1.9)

## 2017-02-04 IMAGING — US US RENAL
1 series · 14 of 25 positions shown · non-contrast
Comparison: None.

CLINICAL DATA: Elevated serum creatinine

EXAM:
RENAL / URINARY TRACT ULTRASOUND COMPLETE

[Series 1: us renal · 0.24mm/px · 14 of 81 slices shown]
[im 1/81]
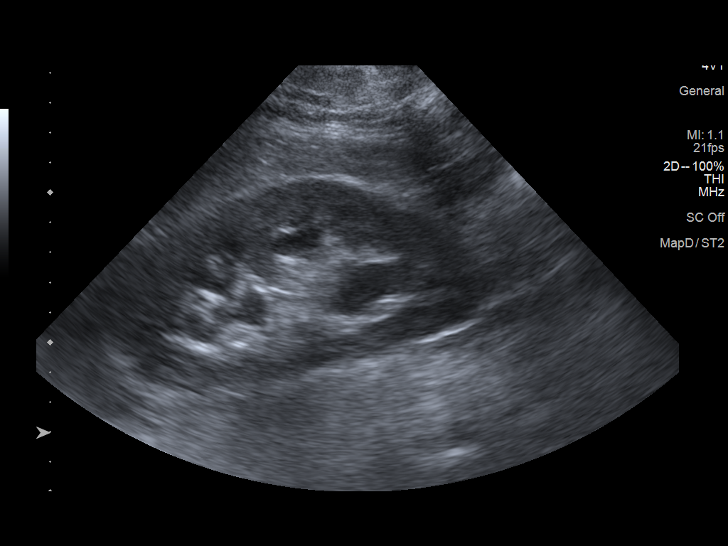
[im 7/81]
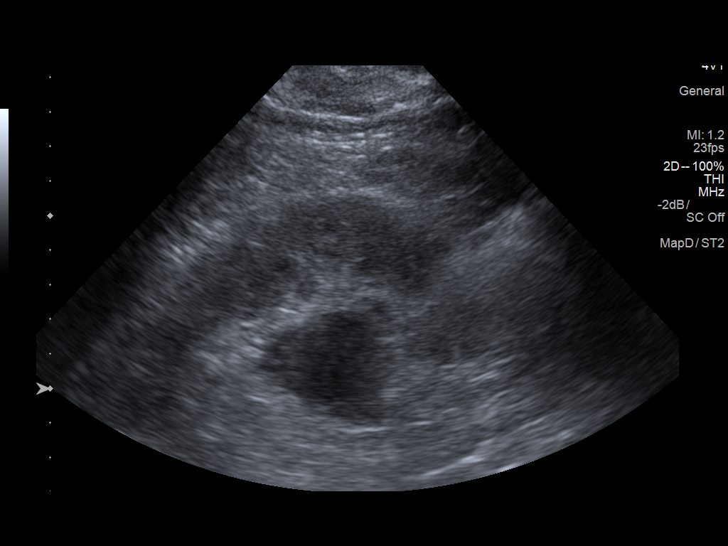
[im 14/81]
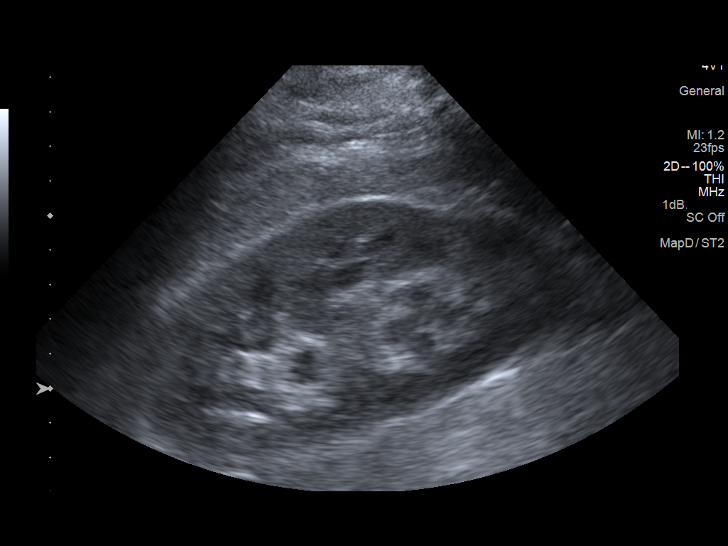
[im 21/81]
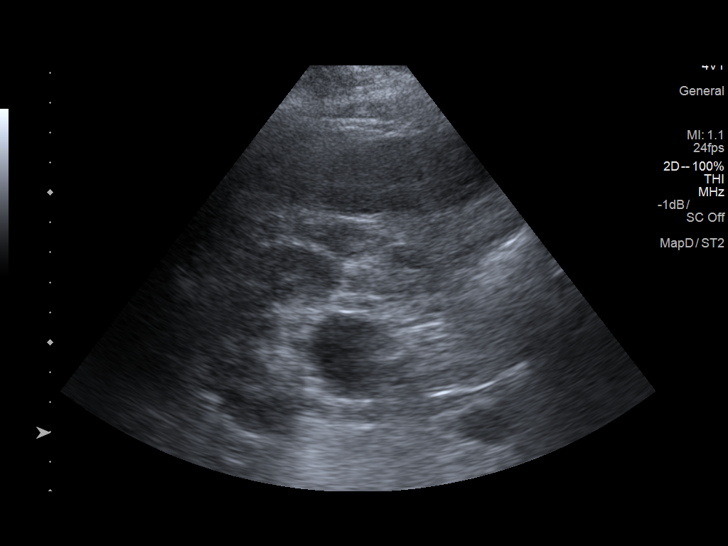
[im 27/81]
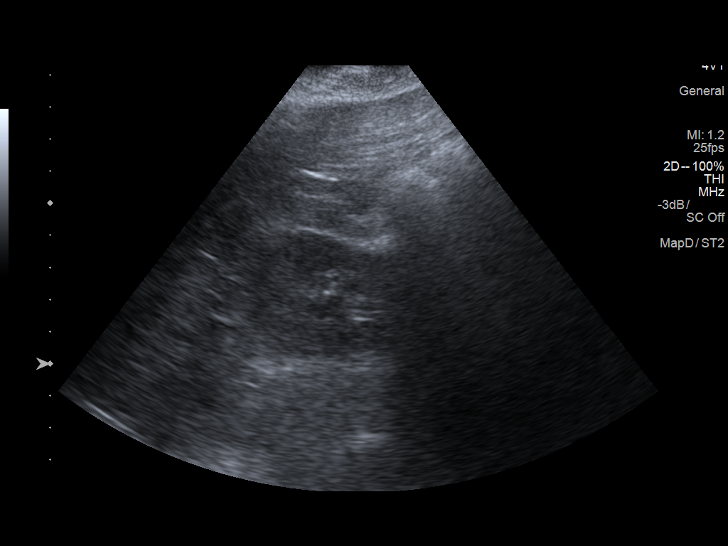
[im 31/81]
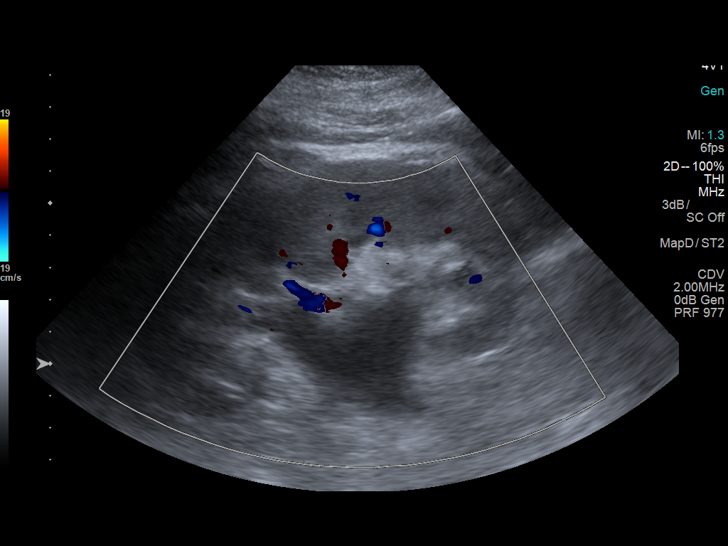
[im 37/81]
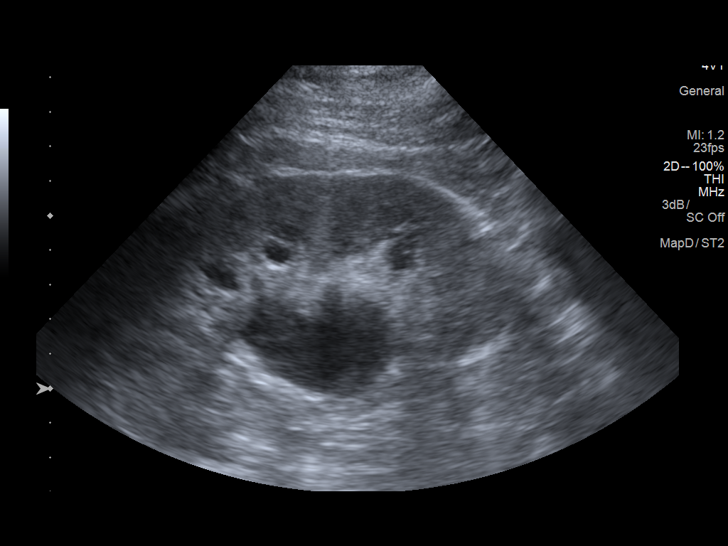
[im 44/81]
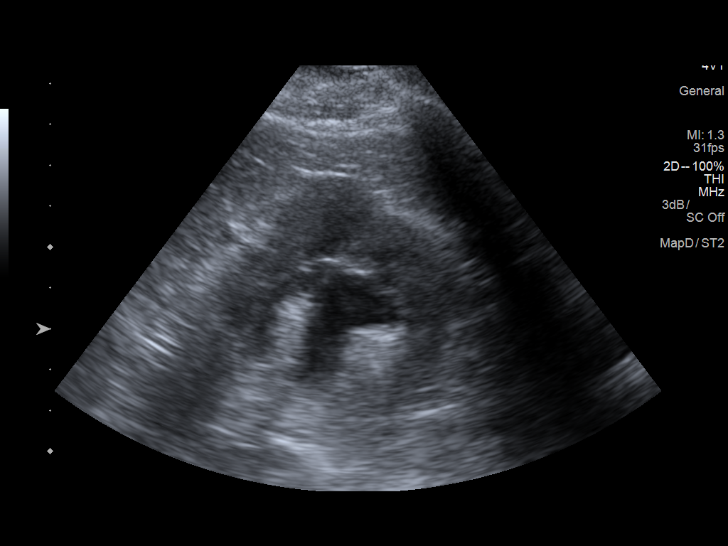
[im 51/81]
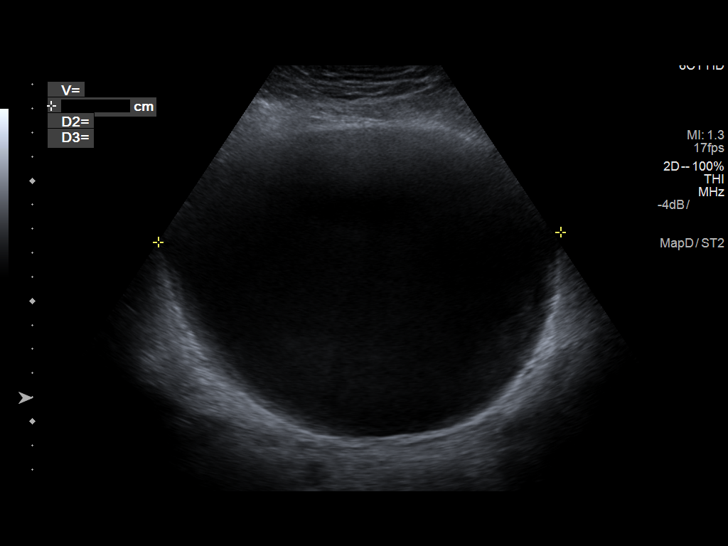
[im 54/81]
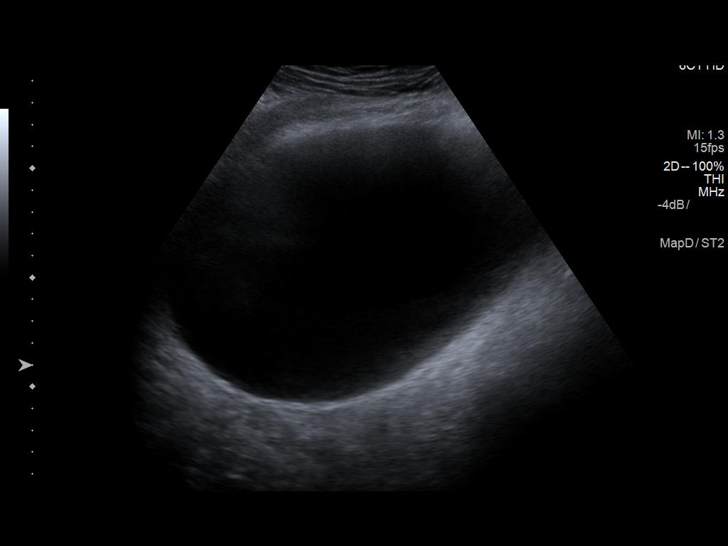
[im 61/81]
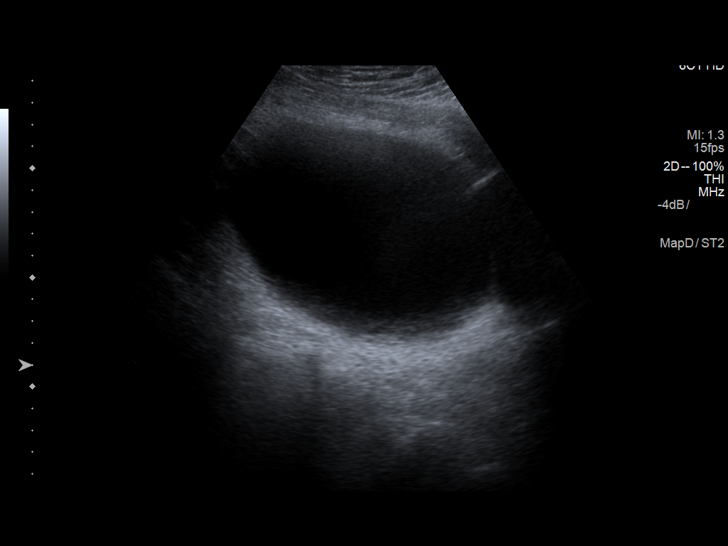
[im 67/81]
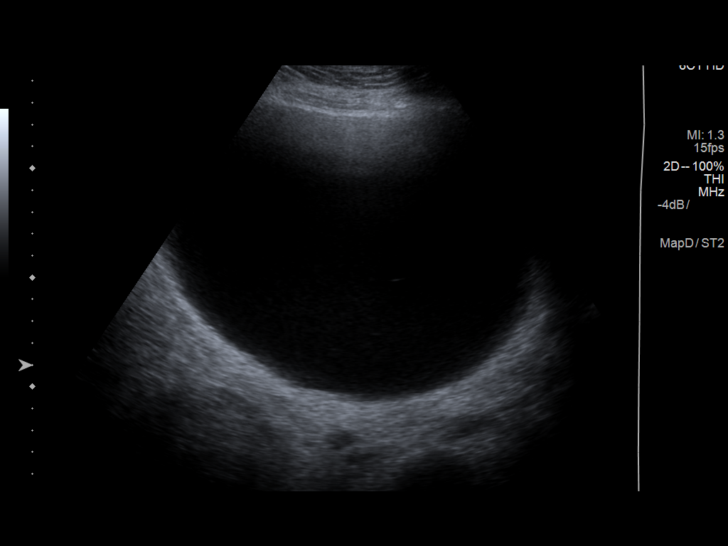
[im 74/81]
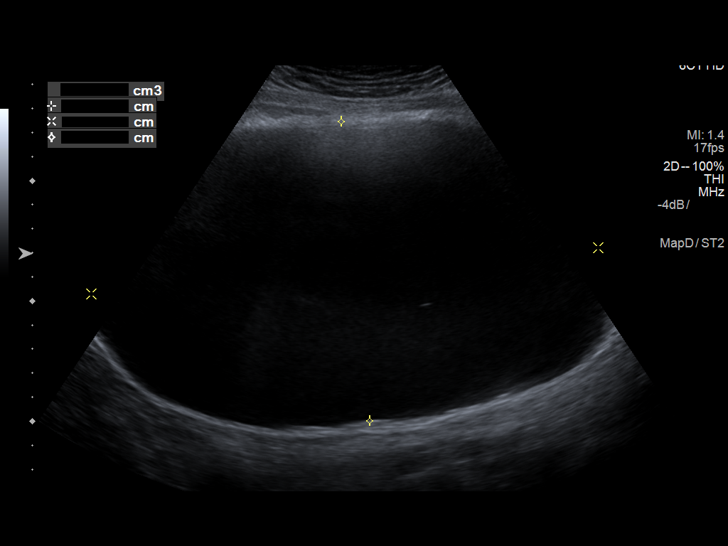
[im 81/81]
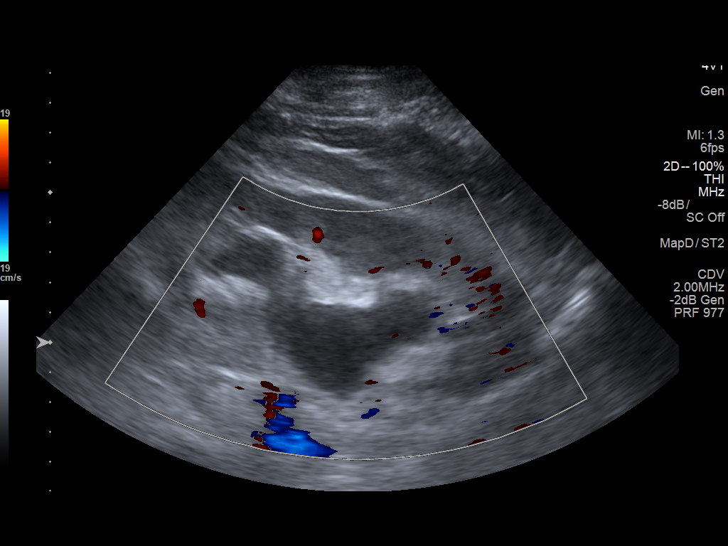

[14 of 25 positions shown; findings below may reference images not displayed]

FINDINGS: Right Kidney:

Length: 13 cm. Moderate hydronephrosis with mild improvement after
voiding. Normal echogenicity. No cortical thinning. No mass.

Left Kidney:

Length: 13 cm. Moderate hydronephrosis with mild improvement after
voiding. Normal echogenicity. No cortical thinning. No mass.

Bladder:

Dilated bladder which limits complete visualization. Pre and
postvoid volume is 3044 and 9999cc respectively. No thickening or
debris identified.

These results will be called to the ordering clinician or
representative by the Radiologist Assistant, and communication
documented in the PACS or zVision Dashboard.
IMPRESSION: Bladder outlet obstruction with bilateral hydronephrosis.

## 2017-02-04 MED ORDER — ACETAMINOPHEN 500 MG PO TABS
1000.0000 mg | ORAL_TABLET | Freq: Four times a day (QID) | ORAL | Status: DC | PRN
  Administered 2017-02-04 – 2017-02-06 (×5): 1000 mg via ORAL
  Administered 2017-02-06: 500 mg via ORAL
  Administered 2017-02-07 (×2): 1000 mg via ORAL
  Filled 2017-02-04 (×9): qty 2

## 2017-02-04 MED ORDER — PIPERACILLIN-TAZOBACTAM 3.375 G IVPB
3.3750 g | Freq: Three times a day (TID) | INTRAVENOUS | Status: DC
  Administered 2017-02-04 – 2017-02-07 (×9): 3.375 g via INTRAVENOUS
  Filled 2017-02-04 (×8): qty 50

## 2017-02-04 MED ORDER — SODIUM CHLORIDE 0.9 % IV SOLN
INTRAVENOUS | Status: DC
  Administered 2017-02-04: 18:00:00 via INTRAVENOUS

## 2017-02-04 MED ORDER — PIPERACILLIN-TAZOBACTAM 3.375 G IVPB
3.3750 g | Freq: Once | INTRAVENOUS | Status: AC
  Administered 2017-02-04: 3.375 g via INTRAVENOUS
  Filled 2017-02-04: qty 50

## 2017-02-04 MED ORDER — LORAZEPAM 2 MG/ML IJ SOLN
1.0000 mg | Freq: Four times a day (QID) | INTRAMUSCULAR | Status: DC | PRN
  Administered 2017-02-05: 1 mg via INTRAVENOUS
  Filled 2017-02-04: qty 1

## 2017-02-04 MED ORDER — MAGNESIUM SULFATE 2 GM/50ML IV SOLN
2.0000 g | Freq: Once | INTRAVENOUS | Status: AC
  Administered 2017-02-04: 2 g via INTRAVENOUS
  Filled 2017-02-04: qty 50

## 2017-02-04 MED ORDER — VANCOMYCIN HCL 10 G IV SOLR
1500.0000 mg | Freq: Once | INTRAVENOUS | Status: AC
  Administered 2017-02-04: 1500 mg via INTRAVENOUS
  Filled 2017-02-04 (×2): qty 1500

## 2017-02-04 MED ORDER — VANCOMYCIN HCL IN DEXTROSE 1-5 GM/200ML-% IV SOLN
1000.0000 mg | Freq: Two times a day (BID) | INTRAVENOUS | Status: DC
  Administered 2017-02-04 – 2017-02-06 (×6): 1000 mg via INTRAVENOUS
  Filled 2017-02-04 (×6): qty 200

## 2017-02-04 MED ORDER — DIPHENHYDRAMINE HCL 25 MG PO CAPS
50.0000 mg | ORAL_CAPSULE | Freq: Every evening | ORAL | Status: DC | PRN
  Administered 2017-02-05 – 2017-02-08 (×4): 50 mg via ORAL
  Filled 2017-02-04 (×5): qty 2

## 2017-02-04 MED ORDER — PANTOPRAZOLE SODIUM 40 MG PO TBEC
40.0000 mg | DELAYED_RELEASE_TABLET | Freq: Every day | ORAL | Status: DC
  Administered 2017-02-05 – 2017-02-09 (×5): 40 mg via ORAL
  Filled 2017-02-04 (×5): qty 1

## 2017-02-04 MED ORDER — HYDRALAZINE HCL 20 MG/ML IJ SOLN: 5.0000 mg | INTRAMUSCULAR | Status: DC | PRN

## 2017-02-04 NOTE — Progress Notes (Signed)
ANTIBIOTIC CONSULT NOTE-Preliminary  Pharmacy Consult for Vancomycin and Zosyn Indication: Cellulitis  Allergies  Allergen Reactions  . Diovan [Valsartan] Cough  . Tagamet [Cimetidine] Other (See Comments)    irrittability    Patient Measurements: Height: 6' (182.9 cm) Weight: 259 lb 13 oz (117.8 kg) IBW/kg (Calculated) : 77.6  Vital Signs: Temp: 99.8 F (37.7 C) (09/17 2353) Temp Source: Oral (09/17 2353) BP: 120/77 (09/17 2353) Pulse Rate: 101 (09/17 2353)  Labs:  Recent Labs  02/03/17 1919  WBC 11.7*  HGB 15.5  PLT 175  CREATININE 0.97    Estimated Creatinine Clearance: 92.6 mL/min (by C-G formula based on SCr of 0.97 mg/dL).  No results for input(s): VANCOTROUGH, VANCOPEAK, VANCORANDOM, GENTTROUGH, GENTPEAK, GENTRANDOM, TOBRATROUGH, TOBRAPEAK, TOBRARND, AMIKACINPEAK, AMIKACINTROU, AMIKACIN in the last 72 hours.   Microbiology: Recent Results (from the past 720 hour(s))  Culture, blood (Routine x 2)     Status: None (Preliminary result)   Collection Time: 02/03/17  8:04 PM  Result Value Ref Range Status   Specimen Description BLOOD RIGHT HAND  Final   Special Requests   Final    BOTTLES DRAWN AEROBIC AND ANAEROBIC Blood Culture adequate volume   Culture PENDING  Incomplete   Report Status PENDING  Incomplete  Culture, blood (Routine x 2)     Status: None (Preliminary result)   Collection Time: 02/03/17  8:04 PM  Result Value Ref Range Status   Specimen Description RIGHT ANTECUBITAL  Final   Special Requests   Final    BOTTLES DRAWN AEROBIC AND ANAEROBIC Blood Culture adequate volume   Culture PENDING  Incomplete   Report Status PENDING  Incomplete    Medical History: Past Medical History:  Diagnosis Date  . Allergic rhinitis   . Arthritis   . Asthma    seasonal per pt  . Bilateral knee pain    OA  . Diverticulosis 02/2012   seen on colonoscopy  . Erectile dysfunction   . GERD (gastroesophageal reflux disease)   . Heart murmur   . Hiatal  hernia   . HLD (hyperlipidemia)   . HTN (hypertension)   . Hydronephrosis determined by ultrasound 08/22/2015  . Overactive bladder   . Prediabetes   . Ringing of ears   . Urinary frequency   . Urinary urgency   . Wears glasses     Medications:  Vancomycin 1 Gm IV given in the ED Zosyn 3.375 Gm IV given in the ED  Assessment: 71 yo obese male seen in the ED with complaints of fever, chills, and cough. Pt has LLE tenderness and erythema. Pharmacy has been asked to dose Vancomycin and Zosyn for cellulitis, with sepsis protocol initiated.  Goal of Therapy:  Vancomycin troughs 15-20 mcg/ml Eradicate infection  Plan:  Preliminary review of pertinent patient information completed.  Protocol will be initiated with doses of Vancomycin 1500 mg IV given in addition to the 1 Gm dose given in the ED for a total loading dose of 2500 mg; Zosyn 3.375 Gm IV to be given @6  hours after the initial Ed dose.[place] clinical pharmacist will complete review during morning rounds to assess patient and finalize treatment regimen if needed.  Norberto Sorenson, [facility] 02/04/2017,2:33 AM

## 2017-02-04 NOTE — Progress Notes (Signed)
Report called to Homer Glen, RN in stepdown unit.

## 2017-02-04 NOTE — Care Management Note (Signed)
Case Management Note  Patient Details  Name: EMERSON SCHREIFELS MRN: [id number] Date of Birth: 1945-10-15  Subjective/Objective:                  Pt admitted with cellulitis of his LLE, now with +BC. Pt is from home, ind at baseline. 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 Laurita Quint at the [facility]. CM has contacted April Alexander at the [facility] and notified them of pt's admission. Pt has appointment in [place] today, he has called and rescheduled that. Pt's SW is Drue Stager pager # [phone]. Pt has PCP in [place], Dr. Nevada Crane. Pt has [place]are along with [facility], only has [place] listed on chart.   Action/Plan: CM notified FC that pt also has medicare. Anticiapte DC home with self care. CM will cont to follow. May need HH if cond deteriorates.   Expected Discharge Date:  02/05/17               Expected Discharge Plan:  Home/Self Care  In-House Referral:  NA  Discharge planning Services  CM Consult  Status of Service:  In process, will continue to follow  Sherald Barge, RN 02/04/2017, 11:34 AM

## 2017-02-04 NOTE — Progress Notes (Signed)
CRITICAL VALUE ALERT  Critical Value:  Lactic acid 2.9  Date & Time Notied:  02/04/2017 at 05:37  Provider Notified: Carvel Getting  Orders Received/Actions taken: Orders received

## 2017-02-04 NOTE — Progress Notes (Signed)
Patient ID: Gary Carroll, male   DOB: 1945-07-27, 71 y.o.   MRN: [id number]    PROGRESS NOTE    Gary Carroll  GUR:[id number] DOB: Jul 27, 1945 DOA: 02/03/2017  PCP: Celene Squibb, MD   Brief Narrative:  Pt is 71 yo male with known osteoarthritis, asthma, GERD, HLD, HTN,, presented to ED with several days duration of ongoing fevers, malaise, chills, poor oral intake, worsening LLE edema and erythema, pain and warmth to touch.   Pt was started on Vanc and Zosyn and TRH asked to admit for further evaluation.   Morning of 9/18 pt was noted to be intermittently confused with worsening fevers and restlessness. Transfer to SDU requested.   Assessment & Plan:   Principal Problem:   Sepsis secondary to Cellulitis of left lower extremity, gram + bacteremia, ? UTI - as of this AM 9/18 pt meets criteria for sepsis with T > 101 F, WBC 16 K source known - will continue broad spectrum ABX Vancomycin and Zosyn day #2 - transfer to SDU is appropriate due to critical nature of pt's illness - low grade IVF to avoid pulmonary vascular congestion - blood cultures prelim report with g+ cocci in chains, final report pending  - repeat blood cultures to be repeated in AM 9/19 - follow up on urine culture, CBC in AM - keep LLE elevated   Active Problems:   Acute metabolic encephalopathy - likely related to acute illness - monitor closely in SDU     Chronic diastolic CHF - last ECHO in 2017 with grade I diastolic CHF - pt with already what he explains is chronic LE edema but appears somewhat worse - due to sepsis IVF initiated but will lower the rate to avoid pulmonary vascular congestion - ECHO requested, continue to monitor daily weights, I/O    Essential hypertension, benign - SBP in 150's, will add Hydralazine as needed for now      Hypomagnesemia - supplemented - repeat Mg in AM    HLD - continue statin    AF (paroxysmal atrial fibrillation) ([place]) - continue Metoprolol and  Apixaban    Morbid obesity - Body mass index is 35.24 kg/m.  DVT prophylaxis: Apixaban  Code Status: Full  Family Communication: Patient at bedside  Disposition Plan: to be determined   Consultants:   None  Procedures:   None  Antimicrobials:   Vancomycin 9/18 -->  Zosyn 9/18 -->  Subjective: Pt more confused but denies any specific concerns.   Objective: Vitals:   02/03/17 2353 02/04/17 0629 02/04/17 1426 02/04/17 1645  BP: 120/77 118/68 (!) 151/69   Pulse: (!) 101 (!) 104 (!) 101   Resp: 20 20    Temp: 99.8 F (37.7 C) 98.9 F (37.2 C) (!) 102.8 F (39.3 C) (!) 100.4 F (38 C)  TempSrc: Oral Oral  Oral  SpO2: 95% 95% 93%   Weight: 117.8 kg (259 lb 13 oz)     Height: 6' (1.829 m)       Intake/Output Summary (Last 24 hours) at 02/04/17 1745 Last data filed at 02/04/17 1300  Gross per 24 hour  Intake             2290 ml  Output              452 ml  Net             1838 ml   Filed Weights   02/03/17 1834 02/03/17 2353  Weight: 117.9 kg (260 lb)  117.8 kg (259 lb 13 oz)    Examination:  General exam: Appears calm but confused.  Respiratory system: diminished breath sounds at bases with mild exp wheezing  Cardiovascular system: S1 & S2 heard, RRR. No  rubs, gallops or clicks. Gastrointestinal system: Abdomen is nondistended, soft and nontender. No organomegaly or masses felt.  Central nervous system: Alert but confused, follows some commands and answers some questions appropriately  Extremities: bilateral LE edema but much worse on LLE, also erythema and TTP in LLE with warmth to touch  Psychiatry: Judgement and insight poor at this time due to confusion   Data Reviewed: I have personally reviewed following labs and imaging studies  CBC:  Recent Labs Lab 02/03/17 1919 02/04/17 0348  WBC 11.7* 16.6*  NEUTROABS 10.4* 15.4*  HGB 15.5 14.3  HCT 46.3 42.1  MCV 89.7 90.1  PLT 175 681*   Basic Metabolic Panel:  Recent Labs Lab 02/03/17 1919  02/03/17 2004 02/04/17 0348  NA 137  --  138  K 3.8  --  3.7  CL 102  --  107  CO2 26  --  21*  GLUCOSE 117*  --  92  BUN 18  --  16  CREATININE 0.97  --  0.99  CALCIUM 9.5  --  8.4*  MG  --  1.6*  --    Liver Function Tests:  Recent Labs Lab 02/03/17 1919  AST 34  ALT 39  ALKPHOS 72  BILITOT 0.8  PROT 7.4  ALBUMIN 4.1   Coagulation Profile:  Recent Labs Lab 02/03/17 1919  INR 1.14   Cardiac Enzymes:  Recent Labs Lab 02/03/17 2004  TROPONINI <0.03   Urine analysis:    Component Value Date/Time   COLORURINE YELLOW 02/03/2017 1845   APPEARANCEUR HAZY (A) 02/03/2017 1845   LABSPEC 1.017 02/03/2017 1845   PHURINE 5.0 02/03/2017 1845   GLUCOSEU NEGATIVE 02/03/2017 1845   HGBUR NEGATIVE 02/03/2017 1845   BILIRUBINUR NEGATIVE 02/03/2017 1845   BILIRUBINUR neg 08/31/2012 1203   KETONESUR NEGATIVE 02/03/2017 1845   PROTEINUR NEGATIVE 02/03/2017 1845   UROBILINOGEN negative 08/31/2012 1203   UROBILINOGEN 0.2 05/31/2010 1125   NITRITE NEGATIVE 02/03/2017 1845   LEUKOCYTESUR MODERATE (A) 02/03/2017 1845   Recent Results (from the past 240 hour(s))  Culture, blood (Routine x 2)     Status: None (Preliminary result)   Collection Time: 02/03/17  8:04 PM  Result Value Ref Range Status   Specimen Description BLOOD RIGHT HAND  Final   Special Requests   Final    BOTTLES DRAWN AEROBIC AND ANAEROBIC Blood Culture adequate volume   Culture  Setup Time   Final    GRAM POSITIVE COCCI IN CHAINS Gram Stain Report Called to,Read Back By and Verified With: ELLIS J. AT 0910A ON [id number] BY THOMPSON S. Organism ID to follow CRITICAL RESULT CALLED TO, READ BACK BY AND VERIFIED WITH: L SEAY 02/04/17 @ 20 M VESTAL Performed at [facility], [place] [address], [place], [place] [address]    Culture GRAM POSITIVE COCCI  Final   Report Status PENDING  Incomplete  Culture, blood (Routine x 2)     Status: None (Preliminary result)   Collection Time: 02/03/17  8:04 PM  Result  Value Ref Range Status   Specimen Description RIGHT ANTECUBITAL  Final   Special Requests   Final    BOTTLES DRAWN AEROBIC AND ANAEROBIC Blood Culture adequate volume   Culture NO GROWTH < 12 HOURS  Final  Report Status PENDING  Incomplete  Blood Culture ID Panel (Reflexed)     Status: Abnormal   Collection Time: 02/03/17  8:04 PM  Result Value Ref Range Status   Enterococcus species NOT DETECTED NOT DETECTED Final   Vancomycin resistance NOT DETECTED NOT DETECTED Final   Listeria monocytogenes NOT DETECTED NOT DETECTED Final   Staphylococcus species NOT DETECTED NOT DETECTED Final   Staphylococcus aureus NOT DETECTED NOT DETECTED Final   Methicillin resistance NOT DETECTED NOT DETECTED Final   Streptococcus species DETECTED (A) NOT DETECTED Final    Comment: CRITICAL RESULT CALLED TO, READ BACK BY AND VERIFIED WITH: L SEAY 02/04/17 @ 1413 M VESTAL    Streptococcus agalactiae NOT DETECTED NOT DETECTED Final   Streptococcus pneumoniae NOT DETECTED NOT DETECTED Final   Streptococcus pyogenes NOT DETECTED NOT DETECTED Final   Acinetobacter baumannii NOT DETECTED NOT DETECTED Final   Enterobacteriaceae species NOT DETECTED NOT DETECTED Final   Enterobacter cloacae complex NOT DETECTED NOT DETECTED Final   Escherichia coli NOT DETECTED NOT DETECTED Final   Klebsiella oxytoca NOT DETECTED NOT DETECTED Final   Klebsiella pneumoniae NOT DETECTED NOT DETECTED Final   Proteus species NOT DETECTED NOT DETECTED Final   Serratia marcescens NOT DETECTED NOT DETECTED Final   Carbapenem resistance NOT DETECTED NOT DETECTED Final   Haemophilus influenzae NOT DETECTED NOT DETECTED Final   Neisseria meningitidis NOT DETECTED NOT DETECTED Final   Pseudomonas aeruginosa NOT DETECTED NOT DETECTED Final   Candida albicans NOT DETECTED NOT DETECTED Final   Candida glabrata NOT DETECTED NOT DETECTED Final   Candida krusei NOT DETECTED NOT DETECTED Final   Candida parapsilosis NOT DETECTED NOT DETECTED  Final   Candida tropicalis NOT DETECTED NOT DETECTED Final    Comment: Performed at [facility], [place] [address], [place], [place] [address]    Radiology Studies: Dg Chest Port 1 View  Result Date: 02/04/2017 CLINICAL DATA:  Sepsis. EXAM: PORTABLE CHEST 1 VIEW COMPARISON:  Chest x-ray 02/03/2017. FINDINGS: Mediastinum and hilar structures normal. Cardiomegaly. Bibasilar atelectasis. No pleural effusion or pneumothorax IMPRESSION: Cardiomegaly. No pulmonary venous congestion . Bibasilar atelectasis. Electronically Signed   By: Marcello Moores  Register   On: 02/04/2017 15:17   Dg Chest Port 1 View  Result Date: 02/03/2017 CLINICAL DATA:  Cough and SOB, Patient complains of fever and chills at home. Patient states temp was 100.6 at home and took some tylenol. Denies pain EXAM: PORTABLE CHEST 1 VIEW COMPARISON:  1/19/7 FINDINGS: Mild enlargement of the cardiopericardial silhouette. No mediastinal or hilar masses. No evidence of adenopathy. Lungs are clear.  No convincing pleural effusion.  No pneumothorax. Skeletal structures are demineralized but grossly intact. IMPRESSION: No acute cardiopulmonary disease. Electronically Signed   By: Lajean Manes M.D.   On: 02/03/2017 20:09    Scheduled Meds: . apixaban  5 mg Oral BID  . ferrous sulfate  325 mg Oral Daily  . metoprolol tartrate  25 mg Oral BID  . multivitamin with minerals  1 tablet Oral Daily  . [START ON 02/05/2017] pantoprazole  40 mg Oral Daily  . pravastatin  40 mg Oral Daily  . tamsulosin  0.4 mg Oral QPM   Continuous Infusions: . piperacillin-tazobactam (ZOSYN)  IV 3.375 g (02/04/17 1346)  . vancomycin Stopped (02/04/17 0953)     LOS: 1 day    Time spent: 60 minutes    Faye Ramsay, MD [facility] Pager [phone]  If 7PM-7AM, please contact night-coverage [email or web address] Password [facility] 02/04/2017, 5:45 PM

## 2017-02-04 NOTE — Progress Notes (Signed)
Positive blood cultures: Gram + Cocci with chains. Dr. Doyle Askew notified.

## 2017-02-04 NOTE — Progress Notes (Signed)
[place] for Vancomycin and Zosyn Indication: Cellulitis, Sepsis, Bacteremia  Allergies  Allergen Reactions  . Diovan [Valsartan] Cough  . Tagamet [Cimetidine] Other (See Comments)    irrittability   Patient Measurements: Height: 6' (182.9 cm) Weight: 259 lb 13 oz (117.8 kg) IBW/kg (Calculated) : 77.6  Vital Signs: Temp: 98.9 F (37.2 C) (09/18 0629) Temp Source: Oral (09/18 0629) BP: 118/68 (09/18 0629) Pulse Rate: 104 (09/18 0629)  Labs:  Recent Labs  02/03/17 1919 02/04/17 0348  WBC 11.7* 16.6*  HGB 15.5 14.3  PLT 175 147*  CREATININE 0.97 0.99   Estimated Creatinine Clearance: 90.7 mL/min (by C-G formula based on SCr of 0.99 mg/dL).  No results for input(s): VANCOTROUGH, VANCOPEAK, VANCORANDOM, GENTTROUGH, GENTPEAK, GENTRANDOM, TOBRATROUGH, TOBRAPEAK, TOBRARND, AMIKACINPEAK, AMIKACINTROU, AMIKACIN in the last 72 hours.   Microbiology: Recent Results (from the past 720 hour(s))  Culture, blood (Routine x 2)     Status: None (Preliminary result)   Collection Time: 02/03/17  8:04 PM  Result Value Ref Range Status   Specimen Description BLOOD RIGHT HAND  Final   Special Requests   Final    BOTTLES DRAWN AEROBIC AND ANAEROBIC Blood Culture adequate volume   Culture  Setup Time   Final    GRAM POSITIVE COCCI IN CHAINS Gram Stain Report Called to,Read Back By and Verified With: ELLIS J. AT 0910A ON [id number] BY THOMPSON S.    Culture PENDING  Incomplete   Report Status PENDING  Incomplete  Culture, blood (Routine x 2)     Status: None (Preliminary result)   Collection Time: 02/03/17  8:04 PM  Result Value Ref Range Status   Specimen Description RIGHT ANTECUBITAL  Final   Special Requests   Final    BOTTLES DRAWN AEROBIC AND ANAEROBIC Blood Culture adequate volume   Culture NO GROWTH < 12 HOURS  Final   Report Status PENDING  Incomplete   Medical History: Past Medical History:  Diagnosis Date  . Allergic rhinitis   . Arthritis    . Asthma    seasonal per pt  . Bilateral knee pain    OA  . Diverticulosis 02/2012   seen on colonoscopy  . Erectile dysfunction   . GERD (gastroesophageal reflux disease)   . Heart murmur   . Hiatal hernia   . HLD (hyperlipidemia)   . HTN (hypertension)   . Hydronephrosis determined by ultrasound 08/22/2015  . Overactive bladder   . Prediabetes   . Ringing of ears   . Urinary frequency   . Urinary urgency   . Wears glasses    Medications:  Vancomycin 1 Gm IV given in the ED Zosyn 3.375 Gm IV given in the ED  Assessment: 71 yo obese male seen in the ED with complaints of fever, chills, and cough. Pt has LLE tenderness and erythema. Pharmacy has been asked to dose Vancomycin and Zosyn for cellulitis, with sepsis protocol initiated.  Goal of Therapy:  Vancomycin troughs 15-20 mcg/ml Eradicate infection  Plan:  Preliminary review of pertinent patient information completed.  Protocol will be initiated with doses of Vancomycin 1500 mg IV given in addition to the 1 Gm dose given in the ED for a total loading dose of 2500 mg; Zosyn 3.375 Gm IV.  Addendum: Vancomycin 1gm IV q12hrs Zosyn 3.375gm IV q8h, EID Monitor labs, progress, c/s  Hart Robinsons A, RPH 02/04/2017,11:31 AM

## 2017-02-04 NOTE — Progress Notes (Signed)
CRITICAL VALUE ALERT  Critical Value:  Lactic Acid 2.6  Date & Time Notied:  02/04/17 @ 1000  Provider Notified: Dr. Doyle Askew

## 2017-02-04 NOTE — Progress Notes (Signed)
CRITICAL VALUE ALERT  Critical Value Lactic acid 3.1  Date & Time Notied:  09/17-12/2016 at 0126  Provider Notified: Carvel Getting  Orders Received/Actions taken: Order received to monitor patient.

## 2017-02-05 ENCOUNTER — Inpatient Hospital Stay (HOSPITAL_COMMUNITY): Payer: Medicare HMO

## 2017-02-05 DIAGNOSIS — L03116 Cellulitis of left lower limb: Secondary | ICD-10-CM

## 2017-02-05 DIAGNOSIS — I480 Paroxysmal atrial fibrillation: Secondary | ICD-10-CM

## 2017-02-05 DIAGNOSIS — A419 Sepsis, unspecified organism: Secondary | ICD-10-CM

## 2017-02-05 DIAGNOSIS — I10 Essential (primary) hypertension: Secondary | ICD-10-CM

## 2017-02-05 DIAGNOSIS — E782 Mixed hyperlipidemia: Secondary | ICD-10-CM

## 2017-02-05 DIAGNOSIS — L0390 Cellulitis, unspecified: Secondary | ICD-10-CM

## 2017-02-05 LAB — BASIC METABOLIC PANEL
ANION GAP: 7 (ref 5–15)
BUN: 15 mg/dL (ref 6–20)
CHLORIDE: 104 mmol/L (ref 101–111)
CO2: 22 mmol/L (ref 22–32)
Calcium: 8.3 mg/dL — ABNORMAL LOW (ref 8.9–10.3)
Creatinine, Ser: 0.87 mg/dL (ref 0.61–1.24)
GFR calc Af Amer: >60 mL/min (ref >60)
GFR calc non Af Amer: >60 mL/min (ref >60)
GLUCOSE: 120 mg/dL — AB (ref 65–99)
Potassium: 3.8 mmol/L (ref 3.5–5.1)
SODIUM: 133 mmol/L — AB (ref 135–145)

## 2017-02-05 LAB — CBC WITH DIFFERENTIAL/PLATELET
BASOS ABS: 0 10*3/uL (ref 0.0–0.1)
Basophils Relative: 0 %
Eosinophils Absolute: 0 10*3/uL (ref 0.0–0.7)
Eosinophils Relative: 0 %
HCT: 41.1 % (ref 39.0–52.0)
HEMOGLOBIN: 13.7 g/dL (ref 13.0–17.0)
Lymphocytes Relative: 4 %
Lymphs Abs: 0.7 10*3/uL (ref 0.7–4.0)
MCH: 29.8 pg (ref 26.0–34.0)
MCHC: 33.3 g/dL (ref 30.0–36.0)
MCV: 89.5 fL (ref 78.0–100.0)
Monocytes Absolute: 0.5 10*3/uL (ref 0.1–1.0)
Monocytes Relative: 3 %
NEUTROS PCT: 93 %
Neutro Abs: 17.2 10*3/uL — ABNORMAL HIGH (ref 1.7–7.7)
PLATELETS: 131 10*3/uL — AB (ref 150–400)
RBC: 4.59 MIL/uL (ref 4.22–5.81)
RDW: 14.5 % (ref 11.5–15.5)
WBC: 18.3 10*3/uL — AB (ref 4.0–10.5)

## 2017-02-05 LAB — LACTIC ACID, PLASMA: Lactic Acid, Venous: 1.9 mmol/L (ref 0.5–1.9)

## 2017-02-05 LAB — MAGNESIUM: MAGNESIUM: 1.9 mg/dL (ref 1.7–2.4)

## 2017-02-05 MED ORDER — ONDANSETRON HCL 4 MG PO TABS: 4.0000 mg | ORAL_TABLET | Freq: Four times a day (QID) | ORAL | Status: DC | PRN

## 2017-02-05 MED ORDER — ONDANSETRON HCL 4 MG/2ML IJ SOLN: 4.0000 mg | Freq: Four times a day (QID) | INTRAMUSCULAR | Status: DC | PRN

## 2017-02-05 MED ORDER — PROMETHAZINE HCL 25 MG/ML IJ SOLN: 12.5000 mg | Freq: Once | INTRAMUSCULAR | Status: DC | PRN

## 2017-02-05 MED ORDER — ONDANSETRON HCL 4 MG/2ML IJ SOLN
4.0000 mg | Freq: Four times a day (QID) | INTRAMUSCULAR | Status: DC | PRN
  Administered 2017-02-06 – 2017-02-08 (×2): 4 mg via INTRAVENOUS
  Filled 2017-02-05: qty 2

## 2017-02-05 MED ORDER — ONDANSETRON HCL 4 MG PO TABS: 4.0000 mg | ORAL_TABLET | ORAL | Status: DC | PRN

## 2017-02-05 NOTE — Progress Notes (Signed)
PROGRESS NOTE    Gary Carroll  BTD:[id number] DOB: 1946/04/28 DOA: 02/03/2017 PCP: Celene Squibb, MD    Brief Narrative:  71 year old male admitted with left lower extremity cellulitis and associated sepsis. On intravenous antibiotics. Mental status worse due to underlying infection. Slowly improving with IV antibiotics and fluids. Continue current treatments.   Assessment & Plan:   Principal Problem:   Cellulitis of left lower extremity Active Problems:   Essential hypertension, benign   Mixed hyperlipidemia   GERD (gastroesophageal reflux disease)   AF (paroxysmal atrial fibrillation) ([facility])   Sepsis due to cellulitis ([facility])   BPH (benign prostatic hyperplasia)   Urinary tract infection   1. Cellulitis left lower extremity. Still having low-grade temperatures. Has significant erythema on left lower extremity. Continue IV antibiotics. Keep lower extremity elevated. 2. Gram-positive sepsis. One out of 2 blood cultures positive for Streptococcus. Follow-up further identification/sensitivities. Repeat blood cultures drawn yesterday and arm process. Source is likely cellulitis. Hemodynamics are currently stable. Lactic acid has trended down. 3. Chronic diastolic congestive heart failure. Appears compensated. Continue current treatments. 4. Acute metabolic encephalopathy. Likely related to acute illness. Mental status appears to be improving. Continue to monitor. 5. Chronic atrial fibrillation. Continue on metoprolol and apixaban right coagulation 6. Hyperlipidemia. Continue statin  7. hypertension. Blood pressures are running high. Continue current treatments.    DVT prophylaxis: apixaban Code Status: Full code Family Communication: No family present Disposition Plan: Discharge home once improved   Consultants:     Procedures:     Antimicrobials:   Vancomycin 9/17 >  Zosyn 9/17 >   Subjective: Denies any pain in his left leg. He is aware that he is in the  hospital.  Objective: Vitals:   02/05/17 0300 02/05/17 0400 02/05/17 0500 02/05/17 0600  BP: (!) 145/67 (!) 143/76 (!) 155/69 (!) 170/104  Pulse: 98 (!) 102 (!) 104 (!) 104  Resp: (!) 30 (!) 34 (!) 27   Temp:  (!) 100.4 F (38 C)    TempSrc:  Oral    SpO2: 93% 92% 93% 92%  Weight:   120.3 kg (265 lb 3.4 oz)   Height:        Intake/Output Summary (Last 24 hours) at 02/05/17 0917 Last data filed at 02/05/17 0700  Gross per 24 hour  Intake            982.5 ml  Output                0 ml  Net            982.5 ml   Filed Weights   02/03/17 1834 02/03/17 2353 02/05/17 0500  Weight: 117.9 kg (260 lb) 117.8 kg (259 lb 13 oz) 120.3 kg (265 lb 3.4 oz)    Examination:  General exam: Appears calm and comfortable  Respiratory system: Clear to auscultation. Respiratory effort normal. Cardiovascular system: S1 & S2 heard, RRR. No JVD, murmurs, rubs, gallops or clicks. Gastrointestinal system: Abdomen is nondistended, soft and nontender. No organomegaly or masses felt. Normal bowel sounds heard. Central nervous system: Alert and oriented. No focal neurological deficits. Extremities: Symmetric 5 x 5 power. Skin: Erythema noted on left lower extremity that does not appear to be extending beyond marked borders Psychiatry: Mildly somnolent, but answering questions appropriately     Data Reviewed: I have personally reviewed following labs and imaging studies  CBC:  Recent Labs Lab 02/03/17 1919 02/04/17 0348 02/05/17 0354  WBC 11.7* 16.6* 18.3*  NEUTROABS 10.4*  15.4* 17.2*  HGB 15.5 14.3 13.7  HCT 46.3 42.1 41.1  MCV 89.7 90.1 89.5  PLT 175 147* 937*   Basic Metabolic Panel:  Recent Labs Lab 02/03/17 1919 02/03/17 2004 02/04/17 0348 02/05/17 0354  NA 137  --  138 133*  K 3.8  --  3.7 3.8  CL 102  --  107 104  CO2 26  --  21* 22  GLUCOSE 117*  --  92 120*  BUN 18  --  16 15  CREATININE 0.97  --  0.99 0.87  CALCIUM 9.5  --  8.4* 8.3*  MG  --  1.6*  --  1.9    GFR: Estimated Creatinine Clearance: 104.3 mL/min (by C-G formula based on SCr of 0.87 mg/dL). Liver Function Tests:  Recent Labs Lab 02/03/17 1919  AST 34  ALT 39  ALKPHOS 72  BILITOT 0.8  PROT 7.4  ALBUMIN 4.1   No results for input(s): LIPASE, AMYLASE in the last 168 hours. No results for input(s): AMMONIA in the last 168 hours. Coagulation Profile:  Recent Labs Lab 02/03/17 1919  INR 1.14   Cardiac Enzymes:  Recent Labs Lab 02/03/17 2004  TROPONINI <0.03   BNP (last 3 results) No results for input(s): PROBNP in the last 8760 hours. HbA1C: No results for input(s): HGBA1C in the last 72 hours. CBG: No results for input(s): GLUCAP in the last 168 hours. Lipid Profile: No results for input(s): CHOL, HDL, LDLCALC, TRIG, CHOLHDL, LDLDIRECT in the last 72 hours. Thyroid Function Tests: No results for input(s): TSH, T4TOTAL, FREET4, T3FREE, THYROIDAB in the last 72 hours. Anemia Panel: No results for input(s): VITAMINB12, FOLATE, FERRITIN, TIBC, IRON, RETICCTPCT in the last 72 hours. Sepsis Labs:  Recent Labs Lab 02/04/17 0348 02/04/17 0759 02/04/17 1112 02/05/17 0354  LATICACIDVEN 2.9* 2.6* 1.8 1.9    Recent Results (from the past 240 hour(s))  Culture, blood (Routine x 2)     Status: None (Preliminary result)   Collection Time: 02/03/17  8:04 PM  Result Value Ref Range Status   Specimen Description BLOOD RIGHT HAND  Final   Special Requests   Final    BOTTLES DRAWN AEROBIC AND ANAEROBIC Blood Culture adequate volume   Culture  Setup Time   Final    GRAM POSITIVE COCCI IN CHAINS Gram Stain Report Called to,Read Back By and Verified With: ELLIS J. AT 0910A ON [id number] BY THOMPSON S. Organism ID to follow CRITICAL RESULT CALLED TO, READ BACK BY AND VERIFIED WITH: L SEAY 02/04/17 @ 77 M VESTAL Performed at [facility], [place] [address] [place], [place] [address]    Culture GRAM POSITIVE COCCI  Final   Report Status PENDING  Incomplete  Culture,  blood (Routine x 2)     Status: None (Preliminary result)   Collection Time: 02/03/17  8:04 PM  Result Value Ref Range Status   Specimen Description RIGHT ANTECUBITAL  Final   Special Requests   Final    BOTTLES DRAWN AEROBIC AND ANAEROBIC Blood Culture adequate volume   Culture NO GROWTH 2 DAYS  Final   Report Status PENDING  Incomplete  Blood Culture ID Panel (Reflexed)     Status: Abnormal   Collection Time: 02/03/17  8:04 PM  Result Value Ref Range Status   Enterococcus species NOT DETECTED NOT DETECTED Final   Vancomycin resistance NOT DETECTED NOT DETECTED Final   Listeria monocytogenes NOT DETECTED NOT DETECTED Final   Staphylococcus species NOT DETECTED NOT DETECTED Final  Staphylococcus aureus NOT DETECTED NOT DETECTED Final   Methicillin resistance NOT DETECTED NOT DETECTED Final   Streptococcus species DETECTED (A) NOT DETECTED Final    Comment: CRITICAL RESULT CALLED TO, READ BACK BY AND VERIFIED WITH: L SEAY 02/04/17 @ 1413 M VESTAL    Streptococcus agalactiae NOT DETECTED NOT DETECTED Final   Streptococcus pneumoniae NOT DETECTED NOT DETECTED Final   Streptococcus pyogenes NOT DETECTED NOT DETECTED Final   Acinetobacter baumannii NOT DETECTED NOT DETECTED Final   Enterobacteriaceae species NOT DETECTED NOT DETECTED Final   Enterobacter cloacae complex NOT DETECTED NOT DETECTED Final   Escherichia coli NOT DETECTED NOT DETECTED Final   Klebsiella oxytoca NOT DETECTED NOT DETECTED Final   Klebsiella pneumoniae NOT DETECTED NOT DETECTED Final   Proteus species NOT DETECTED NOT DETECTED Final   Serratia marcescens NOT DETECTED NOT DETECTED Final   Carbapenem resistance NOT DETECTED NOT DETECTED Final   Haemophilus influenzae NOT DETECTED NOT DETECTED Final   Neisseria meningitidis NOT DETECTED NOT DETECTED Final   Pseudomonas aeruginosa NOT DETECTED NOT DETECTED Final   Candida albicans NOT DETECTED NOT DETECTED Final   Candida glabrata NOT DETECTED NOT DETECTED Final    Candida krusei NOT DETECTED NOT DETECTED Final   Candida parapsilosis NOT DETECTED NOT DETECTED Final   Candida tropicalis NOT DETECTED NOT DETECTED Final    Comment: Performed at [facility], [place] [address], [place], [place] [address]         Radiology Studies: Dg Chest Port 1 View  Result Date: 02/04/2017 CLINICAL DATA:  Sepsis. EXAM: PORTABLE CHEST 1 VIEW COMPARISON:  Chest x-ray 02/03/2017. FINDINGS: Mediastinum and hilar structures normal. Cardiomegaly. Bibasilar atelectasis. No pleural effusion or pneumothorax IMPRESSION: Cardiomegaly. No pulmonary venous congestion . Bibasilar atelectasis. Electronically Signed   By: Marcello Moores  Register   On: 02/04/2017 15:17   Dg Chest Port 1 View  Result Date: 02/03/2017 CLINICAL DATA:  Cough and SOB, Patient complains of fever and chills at home. Patient states temp was 100.6 at home and took some tylenol. Denies pain EXAM: PORTABLE CHEST 1 VIEW COMPARISON:  1/19/7 FINDINGS: Mild enlargement of the cardiopericardial silhouette. No mediastinal or hilar masses. No evidence of adenopathy. Lungs are clear.  No convincing pleural effusion.  No pneumothorax. Skeletal structures are demineralized but grossly intact. IMPRESSION: No acute cardiopulmonary disease. Electronically Signed   By: Lajean Manes M.D.   On: 02/03/2017 20:09        Scheduled Meds: . apixaban  5 mg Oral BID  . ferrous sulfate  325 mg Oral Daily  . metoprolol tartrate  25 mg Oral BID  . multivitamin with minerals  1 tablet Oral Daily  . pantoprazole  40 mg Oral Daily  . pravastatin  40 mg Oral Daily  . tamsulosin  0.4 mg Oral QPM   Continuous Infusions: . sodium chloride 50 mL/hr at 02/04/17 1827  . piperacillin-tazobactam (ZOSYN)  IV 3.375 g (02/05/17 0609)  . vancomycin 1,000 mg (02/04/17 2100)     LOS: 2 days    Time spent: 34mins    Mariaclara Spear, MD [facility] Pager [phone]  If 7PM-7AM, please contact  night-coverage [email or web address] Password [email or web address] 02/05/2017, 9:17 AM

## 2017-02-05 NOTE — Progress Notes (Signed)
Pt has AP CPAP. CPAP plugged into red outlet with 4L O2 inline.

## 2017-02-05 NOTE — ED Provider Notes (Signed)
[facility] DEPT Provider Note   CSN: [id number] Arrival date & time: 02/03/17  1722     History   Chief Complaint Chief Complaint  Patient presents with  . Fever  . Cough    HPI Gary Carroll is a 71 y.o. male.  Patient complains of fever and weakness today.   The history is provided by the patient.  Fever   This is a new problem. The current episode started yesterday. The problem occurs constantly. The problem has not changed since onset.His temperature was unmeasured prior to arrival. Associated symptoms include cough. Pertinent negatives include no chest pain, no diarrhea, no congestion and no headaches. He has tried nothing for the symptoms.  Cough  Pertinent negatives include no chest pain and no headaches.    Past Medical History:  Diagnosis Date  . Allergic rhinitis   . Arthritis   . Asthma    seasonal per pt  . Bilateral knee pain    OA  . Diverticulosis 02/2012   seen on colonoscopy  . Erectile dysfunction   . GERD (gastroesophageal reflux disease)   . Heart murmur   . Hiatal hernia   . HLD (hyperlipidemia)   . HTN (hypertension)   . Hydronephrosis determined by ultrasound 08/22/2015  . Overactive bladder   . Prediabetes   . Ringing of ears   . Urinary frequency   . Urinary urgency   . Wears glasses     Patient Active Problem List   Diagnosis Date Noted  . BPH (benign prostatic hyperplasia) 02/04/2017  . Urinary tract infection 02/04/2017  . Cellulitis of left lower extremity 02/03/2017  . GERD (gastroesophageal reflux disease) 02/03/2017  . AF (paroxysmal atrial fibrillation) ([place]) 02/03/2017  . Sepsis due to cellulitis ([place]) 02/03/2017  . New onset atrial fibrillation ([place]) 08/24/2015  . Diuresis excessive 08/22/2015  . Bladder outlet obstruction 08/22/2015  . Hydronephrosis determined by ultrasound 08/22/2015  . AKI (acute kidney injury) ([place]) 08/22/2015  . Hypernatremia 08/22/2015  . Acute blood loss anemia 08/22/2015  .  Hematuria 08/22/2015  . 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 Peripheral edema 08/31/2012  . Urinary frequency 08/31/2012  . Diverticulosis 03/23/2012  . Impaired fasting glucose 02/06/2012  . Decreased libido 03/07/2011  . Erectile dysfunction 03/07/2011  . Essential hypertension, benign 12/20/2010  . Mixed hyperlipidemia 12/20/2010  . ALLERGIC RHINITIS, SEASONAL 05/02/2010  . 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 HIATAL HERNIA, HX OF 05/02/2010    Past Surgical History:  Procedure Laterality Date  . CHOLECYSTECTOMY    . COLONOSCOPY  02/2012   diverticulosis; Dr. Deatra Ina  . FINGER SURGERY Right   . TONSILLECTOMY    . 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 Lorre Nick)  . TOTAL KNEE ARTHROPLASTY Right 06/19/2015   Procedure: TOTAL KNEE ARTHROPLASTY;  Surgeon: Vickey Huger, MD;  Location: [facility];  Service: Orthopedics;  Laterality: Right;       Home Medications    Prior to Admission medications   Medication Sig Start Date End Date Taking? Authorizing Provider  apixaban (ELIQUIS) 5 MG TABS tablet Take 1 tablet (5 mg total) by mouth 2 (two) times daily. 08/24/15  Yes Isaac Bliss, Rayford Halsted, MD  diphenhydrAMINE (BENADRYL) 25 MG tablet Take 50 mg by mouth at bedtime as needed for sleep.   Yes [provider]  ferrous sulfate 325 (65 FE) MG tablet Take 325 mg by mouth daily. 08/04/15  Yes [provider]  loratadine (CLARITIN) 10 MG tablet Take 10 mg by mouth  daily.   Yes [provider]  Melatonin 5 MG TABS Take 5 mg by mouth at bedtime.   Yes [provider]  metoprolol tartrate (LOPRESSOR) 25 MG tablet Take 1 tablet (25 mg total) by mouth 2 (two) times daily. 08/24/15  Yes Isaac Bliss, Rayford Halsted, MD  Multiple Vitamins-Minerals (MULTIVITAL) tablet Take 1 tablet by mouth daily.     Yes [provider]  Omega-3 Fatty Acids (FISH OIL) 1000 MG CAPS Take 4 capsules by mouth daily.    Yes [provider]  oxybutynin (DITROPAN-XL) 10 MG 24 hr  tablet Take 10 mg by mouth daily.   Yes [provider]  pravastatin (PRAVACHOL) 40 MG tablet Take 1 tablet (40 mg total) by mouth daily. 09/16/12  Yes Rita Ohara, MD  ranitidine (ZANTAC) 150 MG tablet Take 150 mg by mouth at bedtime as needed for heartburn.   Yes [provider]  sodium chloride (OCEAN) 0.65 % SOLN nasal spray Place 1 spray into both nostrils as needed for congestion.   Yes [provider]  vitamin C (ASCORBIC ACID) 500 MG tablet Take 500 mg by mouth daily.   Yes [provider]  tamsulosin (FLOMAX) 0.4 MG CAPS capsule Take 0.4 mg by mouth every evening.    [provider]    Family History Family History  Problem Relation Age of Onset  . Uterine cancer Mother   . Cancer Mother        uterine  . Coronary artery disease Father   . Coronary artery disease Paternal Grandfather   . Heart disease Paternal Grandfather   . Colon cancer Neg Hx   . Esophageal cancer Neg Hx   . Stomach cancer Neg Hx   . Rectal cancer Neg Hx     Social History Social History  Substance Use Topics  . Smoking status: Former Smoker    Types: Cigarettes    Quit date: 01/18/2010  . Smokeless tobacco: Never Used     Comment: Smoked for 25 years- quit September 2011.  Passive exposure from wife  . Alcohol use 0.6 - 1.2 oz/week    1 - 2 Glasses of wine per week     Comment: Stopped drinking 1-2 glasses of wine daily     Allergies   Diovan [valsartan] and Tagamet [cimetidine]   Review of Systems Review of Systems  Constitutional: Positive for fever. Negative for appetite change and fatigue.  HENT: Negative for congestion, ear discharge and sinus pressure.   Eyes: Negative for discharge.  Respiratory: Positive for cough.   Cardiovascular: Negative for chest pain.  Gastrointestinal: Negative for abdominal pain and diarrhea.  Genitourinary: Negative for frequency and hematuria.  Musculoskeletal: Negative for back pain.  Skin: Negative for rash.    Neurological: Negative for seizures and headaches.  Psychiatric/Behavioral: Negative for hallucinations.     Physical Exam Updated Vital Signs BP (!) 170/104   Pulse (!) 104   Temp 98.9 F (37.2 C) (Oral)   Resp (!) 27   Ht 6' (1.829 m)   Wt 120.3 kg (265 lb 3.4 oz)   SpO2 92%   BMI 35.97 kg/m   Physical Exam  Constitutional: He is oriented to person, place, and time. He appears well-developed.  HENT:  Head: Normocephalic.  Eyes: Conjunctivae and EOM are normal. No scleral icterus.  Neck: Neck supple. No thyromegaly present.  Cardiovascular: Normal rate and regular rhythm.  Exam reveals no gallop and no friction rub.   No murmur heard. Pulmonary/Chest:  No stridor. He has no wheezes. He has no rales. He exhibits no tenderness.  Abdominal: He exhibits no distension. There is no tenderness. There is no rebound.  Musculoskeletal: Normal range of motion. He exhibits no edema.  Lymphadenopathy:    He has no cervical adenopathy.  Neurological: He is oriented to person, place, and time. He exhibits normal muscle tone. Coordination normal.  Skin: No rash noted. There is erythema.  Patient with cellulitis to left lower leg.  Psychiatric: He has a normal mood and affect. His behavior is normal.     ED Treatments / Results  Labs (all labs ordered are listed, but only abnormal results are displayed) Labs Reviewed  CULTURE, BLOOD (ROUTINE X 2) - Abnormal; Notable for the following:       Result Value   Culture   (*)    Value: STREPTOCOCCUS DYSGALACTIAE   SUSCEPTIBILITIES TO FOLLOW Performed at [facility], [address] [place], [place] [address]    All other components within normal limits  BLOOD CULTURE ID PANEL (REFLEXED) - Abnormal; Notable for the following:    Streptococcus species DETECTED (*)    All other components within normal limits  COMPREHENSIVE METABOLIC PANEL - Abnormal; Notable for the following:    Glucose, Bld 117 (*)    All other components  within normal limits  CBC WITH DIFFERENTIAL/PLATELET - Abnormal; Notable for the following:    WBC 11.7 (*)    Neutro Abs 10.4 (*)    All other components within normal limits  URINALYSIS, ROUTINE W REFLEX MICROSCOPIC - Abnormal; Notable for the following:    APPearance HAZY (*)    Leukocytes, UA MODERATE (*)    Bacteria, UA RARE (*)    Squamous Epithelial / LPF 0-5 (*)    All other components within normal limits  LACTIC ACID, PLASMA - Abnormal; Notable for the following:    Lactic Acid, Venous 2.4 (*)    All other components within normal limits  LACTIC ACID, PLASMA - Abnormal; Notable for the following:    Lactic Acid, Venous 3.1 (*)    All other components within normal limits  CBC WITH DIFFERENTIAL/PLATELET - Abnormal; Notable for the following:    WBC 16.6 (*)    Platelets 147 (*)    Neutro Abs 15.4 (*)    All other components within normal limits  BASIC METABOLIC PANEL - Abnormal; Notable for the following:    CO2 21 (*)    Calcium 8.4 (*)    All other components within normal limits  LACTIC ACID, PLASMA - Abnormal; Notable for the following:    Lactic Acid, Venous 2.9 (*)    All other components within normal limits  MAGNESIUM - Abnormal; Notable for the following:    Magnesium 1.6 (*)    All other components within normal limits  LACTIC ACID, PLASMA - Abnormal; Notable for the following:    Lactic Acid, Venous 2.6 (*)    All other components within normal limits  CBC WITH DIFFERENTIAL/PLATELET - Abnormal; Notable for the following:    WBC 18.3 (*)    Platelets 131 (*)    Neutro Abs 17.2 (*)    All other components within normal limits  BASIC METABOLIC PANEL - Abnormal; Notable for the following:    Sodium 133 (*)    Glucose, Bld 120 (*)    Calcium 8.3 (*)    All other components within normal limits  I-STAT CG4 LACTIC ACID, ED - Abnormal; Notable for the  following:    Lactic Acid, Venous 2.24 (*)    All other components within normal limits  CULTURE, BLOOD  (ROUTINE X 2)  CULTURE, BLOOD (ROUTINE X 2)  CULTURE, BLOOD (ROUTINE X 2)  PROTIME-INR  TROPONIN I  BRAIN NATRIURETIC PEPTIDE  LACTIC ACID, PLASMA  MAGNESIUM  LACTIC ACID, PLASMA    EKG  EKG Interpretation  Date/Time:  Monday February 03 2017 20:17:45 EDT Ventricular Rate:  113 PR Interval:    QRS Duration: 84 QT Interval:  313 QTC Calculation: 430 R Axis:   -23 Text Interpretation:  Sinus tachycardia Borderline left axis deviation Low voltage, precordial leads Consider anterior infarct Confirmed by Dory Horn) on 02/04/2017 1:15:50 PM       Radiology Dg Chest Port 1 View  Result Date: 02/04/2017 CLINICAL DATA:  Sepsis. EXAM: PORTABLE CHEST 1 VIEW COMPARISON:  Chest x-ray 02/03/2017. FINDINGS: Mediastinum and hilar structures normal. Cardiomegaly. Bibasilar atelectasis. No pleural effusion or pneumothorax IMPRESSION: Cardiomegaly. No pulmonary venous congestion . Bibasilar atelectasis. Electronically Signed   By: Marcello Moores  Register   On: 02/04/2017 15:17   Dg Chest Port 1 View  Result Date: 02/03/2017 CLINICAL DATA:  Cough and SOB, Patient complains of fever and chills at home. Patient states temp was 100.6 at home and took some tylenol. Denies pain EXAM: PORTABLE CHEST 1 VIEW COMPARISON:  1/19/7 FINDINGS: Mild enlargement of the cardiopericardial silhouette. No mediastinal or hilar masses. No evidence of adenopathy. Lungs are clear.  No convincing pleural effusion.  No pneumothorax. Skeletal structures are demineralized but grossly intact. IMPRESSION: No acute cardiopulmonary disease. Electronically Signed   By: Lajean Manes M.D.   On: 02/03/2017 20:09    Procedures Procedures (including critical care time)  Medications Ordered in ED Medications  pravastatin (PRAVACHOL) tablet 40 mg (40 mg Oral Given 02/05/17 1320)  sodium chloride (OCEAN) 0.65 % nasal spray 1 spray (not administered)  tamsulosin (FLOMAX) capsule 0.4 mg (0.4 mg Oral Given 02/04/17 1746)    multivitamin with minerals tablet 1 tablet (1 tablet Oral Given 02/05/17 1320)  metoprolol tartrate (LOPRESSOR) tablet 25 mg (25 mg Oral Given 02/05/17 1121)  ferrous sulfate tablet 325 mg (325 mg Oral Given 02/05/17 1320)  apixaban (ELIQUIS) tablet 5 mg (5 mg Oral Given 02/05/17 1121)  ondansetron (ZOFRAN) tablet 4 mg ( Oral See Alternative 02/05/17 1033)    Or  ondansetron (ZOFRAN) injection 4 mg (4 mg Intravenous Given 02/05/17 1033)  acetaminophen (TYLENOL) tablet 1,000 mg (1,000 mg Oral Given 02/05/17 1211)  pantoprazole (PROTONIX) EC tablet 40 mg (40 mg Oral Given 02/05/17 1120)  diphenhydrAMINE (BENADRYL) capsule 50 mg (50 mg Oral Given 02/05/17 1236)  piperacillin-tazobactam (ZOSYN) IVPB 3.375 g (0 g Intravenous Stopped 02/05/17 1009)  vancomycin (VANCOCIN) IVPB 1000 mg/200 mL premix (0 mg Intravenous Stopped 02/05/17 1145)  LORazepam (ATIVAN) injection 1 mg (not administered)  0.9 %  sodium chloride infusion ( Intravenous New Bag/Given 02/04/17 1827)  hydrALAZINE (APRESOLINE) injection 5 mg (not administered)  sodium chloride 0.9 % bolus 2,000 mL (0 mLs Intravenous Stopped 02/03/17 2224)  vancomycin (VANCOCIN) IVPB 1000 mg/200 mL premix (1,000 mg Intravenous Transfusing/Transfer 02/03/17 2239)  piperacillin-tazobactam (ZOSYN) IVPB 3.375 g (0 g Intravenous Stopped 02/03/17 2112)  acetaminophen (TYLENOL) tablet 1,000 mg (1,000 mg Oral Given 02/03/17 2040)  metoprolol tartrate (LOPRESSOR) injection 5 mg (5 mg Intravenous Given 02/03/17 2215)  sodium chloride 0.9 % bolus 2,000 mL (2,000 mLs Intravenous New Bag/Given 02/04/17 0034)  magnesium sulfate IVPB 2 g 50 mL (0 g Intravenous  Stopped 02/04/17 0244)  vancomycin (VANCOCIN) 1,500 mg in sodium chloride 0.9 % 500 mL IVPB (0 mg Intravenous Stopped 02/04/17 0432)  piperacillin-tazobactam (ZOSYN) IVPB 3.375 g (0 g Intravenous Stopped 02/04/17 0851)     Initial Impression / Assessment and Plan / ED Course  I have reviewed the triage vital signs and the  nursing notes.  Pertinent labs & imaging results that were available during my care of the patient were reviewed by me and considered in my medical decision making (see chart for details).     Patient with cellulitis he will be admitted for IV antibiotics Final Clinical Impressions(s) / ED Diagnoses   Final diagnoses:  Cellulitis of leg, left    New Prescriptions Current Discharge Medication List       Milton Ferguson, MD 02/05/17 [phone]

## 2017-02-06 DIAGNOSIS — K219 Gastro-esophageal reflux disease without esophagitis: Secondary | ICD-10-CM

## 2017-02-06 LAB — CULTURE, BLOOD (ROUTINE X 2): SPECIAL REQUESTS: ADEQUATE

## 2017-02-06 LAB — CBC WITH DIFFERENTIAL/PLATELET
BASOS ABS: 0 10*3/uL (ref 0.0–0.1)
Basophils Relative: 0 %
EOS ABS: 0.1 10*3/uL (ref 0.0–0.7)
EOS PCT: 1 %
HCT: 40.7 % (ref 39.0–52.0)
Hemoglobin: 13.9 g/dL (ref 13.0–17.0)
LYMPHS ABS: 0.7 10*3/uL (ref 0.7–4.0)
Lymphocytes Relative: 6 %
MCH: 30.2 pg (ref 26.0–34.0)
MCHC: 34.2 g/dL (ref 30.0–36.0)
MCV: 88.3 fL (ref 78.0–100.0)
Monocytes Absolute: 0.5 10*3/uL (ref 0.1–1.0)
Monocytes Relative: 4 %
Neutro Abs: 10.3 10*3/uL — ABNORMAL HIGH (ref 1.7–7.7)
Neutrophils Relative %: 89 %
Platelets: 117 10*3/uL — ABNORMAL LOW (ref 150–400)
RBC: 4.61 MIL/uL (ref 4.22–5.81)
RDW: 14.3 % (ref 11.5–15.5)
WBC: 11.5 10*3/uL — AB (ref 4.0–10.5)

## 2017-02-06 LAB — VANCOMYCIN, TROUGH: Vancomycin Tr: 7 ug/mL — ABNORMAL LOW (ref 15–20)

## 2017-02-06 LAB — BASIC METABOLIC PANEL
Anion gap: 7 (ref 5–15)
BUN: 16 mg/dL (ref 6–20)
CHLORIDE: 103 mmol/L (ref 101–111)
CO2: 23 mmol/L (ref 22–32)
Calcium: 8.1 mg/dL — ABNORMAL LOW (ref 8.9–10.3)
Creatinine, Ser: 0.73 mg/dL (ref 0.61–1.24)
GFR calc non Af Amer: >60 mL/min (ref >60)
Glucose, Bld: 123 mg/dL — ABNORMAL HIGH (ref 65–99)
Potassium: 3.7 mmol/L (ref 3.5–5.1)
SODIUM: 133 mmol/L — AB (ref 135–145)

## 2017-02-06 NOTE — Progress Notes (Signed)
PROGRESS NOTE    Gary Carroll  JAS:[id number] DOB: October 19, 1945 DOA: 02/03/2017 PCP: Celene Squibb, MD    Brief Narrative:  71 year old male admitted with left lower extremity cellulitis and associated sepsis. On intravenous antibiotics. Mental status worse due to underlying infection. Slowly improving with IV antibiotics and fluids. Continue current treatments.   Assessment & Plan:   Principal Problem:   Cellulitis of left lower extremity Active Problems:   Essential hypertension, benign   Mixed hyperlipidemia   GERD (gastroesophageal reflux disease)   AF (paroxysmal atrial fibrillation) ([facility])   Sepsis due to cellulitis ([facility])   BPH (benign prostatic hyperplasia)   Urinary tract infection   1. Cellulitis left lower extremity. Overall fevers have improved. Continues to have significant erythema on left lower extremity, but it does not appear to be extending beyond marked borders. Continue IV antibiotics. Keep lower extremity elevated. 2. Gram-positive sepsis. One out of 2 blood cultures positive for Streptococcus, group C. Follow-up further identification/sensitivities. Repeat blood cultures drawn yesterday have shown no growth. Source is likely cellulitis. Hemodynamics are currently stable. Lactic acid has trended down. Will discuss with ID. 3. Chronic diastolic congestive heart failure. Appears compensated. Continue current treatments. 4. Acute metabolic encephalopathy. Likely related to acute illness. Mental status appears to be improving. Continue to monitor. 5. Chronic atrial fibrillation. Continue on metoprolol and apixaban for anticoagulation 6. Hyperlipidemia. Continue statin  7. hypertension. Blood pressures are stable. Continue current treatments.    DVT prophylaxis: apixaban Code Status: Full code Family Communication: No family present Disposition Plan: Discharge home once improved   Consultants:     Procedures:     Antimicrobials:   Vancomycin 9/17  >  Zosyn 9/17 >   Subjective: Feeling better. Wants to go home. Still has some pain in left leg  Objective: Vitals:   02/06/17 0000 02/06/17 0400 02/06/17 0500 02/06/17 0738  BP:      Pulse:    86  Resp:    (!) 29  Temp: 98.6 F (37 C) 98 F (36.7 C)  98 F (36.7 C)  TempSrc: Oral Oral  Oral  SpO2:    93%  Weight:   118.5 kg (261 lb 3.9 oz)   Height:        Intake/Output Summary (Last 24 hours) at 02/06/17 0916 Last data filed at 02/06/17 0910  Gross per 24 hour  Intake              440 ml  Output             2050 ml  Net            -1610 ml   Filed Weights   02/03/17 2353 02/05/17 0500 02/06/17 0500  Weight: 117.8 kg (259 lb 13 oz) 120.3 kg (265 lb 3.4 oz) 118.5 kg (261 lb 3.9 oz)    Examination:  General exam: Appears calm and comfortable  Respiratory system: Clear to auscultation. Respiratory effort normal. Cardiovascular system: S1 & S2 heard, irregular. No JVD, murmurs, rubs, gallops or clicks. Gastrointestinal system: Abdomen is nondistended, soft and nontender. No organomegaly or masses felt. Normal bowel sounds heard. Central nervous system: Alert and oriented. No focal neurological deficits. Extremities: Symmetric 5 x 5 power. Skin: Erythema noted on left lower extremity that does not appear to be extending beyond marked borders, still warm and mildly tender, significant edema  Psychiatry: appropriate, cooperative, alert and oriented    Data Reviewed: I have personally reviewed following labs and imaging studies  CBC:  Recent Labs Lab 02/03/17 1919 02/04/17 0348 02/05/17 0354 02/06/17 0553  WBC 11.7* 16.6* 18.3* 11.5*  NEUTROABS 10.4* 15.4* 17.2* 10.3*  HGB 15.5 14.3 13.7 13.9  HCT 46.3 42.1 41.1 40.7  MCV 89.7 90.1 89.5 88.3  PLT 175 147* 131* 222*   Basic Metabolic Panel:  Recent Labs Lab 02/03/17 1919 02/03/17 2004 02/04/17 0348 02/05/17 0354 02/06/17 0553  NA 137  --  138 133* 133*  K 3.8  --  3.7 3.8 3.7  CL 102  --  107 104  103  CO2 26  --  21* 22 23  GLUCOSE 117*  --  92 120* 123*  BUN 18  --  16 15 16   CREATININE 0.97  --  0.99 0.87 0.73  CALCIUM 9.5  --  8.4* 8.3* 8.1*  MG  --  1.6*  --  1.9  --    GFR: Estimated Creatinine Clearance: 112.6 mL/min (by C-G formula based on SCr of 0.73 mg/dL). Liver Function Tests:  Recent Labs Lab 02/03/17 1919  AST 34  ALT 39  ALKPHOS 72  BILITOT 0.8  PROT 7.4  ALBUMIN 4.1   No results for input(s): LIPASE, AMYLASE in the last 168 hours. No results for input(s): AMMONIA in the last 168 hours. Coagulation Profile:  Recent Labs Lab 02/03/17 1919  INR 1.14   Cardiac Enzymes:  Recent Labs Lab 02/03/17 2004  TROPONINI <0.03   BNP (last 3 results) No results for input(s): PROBNP in the last 8760 hours. HbA1C: No results for input(s): HGBA1C in the last 72 hours. CBG: No results for input(s): GLUCAP in the last 168 hours. Lipid Profile: No results for input(s): CHOL, HDL, LDLCALC, TRIG, CHOLHDL, LDLDIRECT in the last 72 hours. Thyroid Function Tests: No results for input(s): TSH, T4TOTAL, FREET4, T3FREE, THYROIDAB in the last 72 hours. Anemia Panel: No results for input(s): VITAMINB12, FOLATE, FERRITIN, TIBC, IRON, RETICCTPCT in the last 72 hours. Sepsis Labs:  Recent Labs Lab 02/04/17 0348 02/04/17 0759 02/04/17 1112 02/05/17 0354  LATICACIDVEN 2.9* 2.6* 1.8 1.9    Recent Results (from the past 240 hour(s))  Culture, blood (Routine x 2)     Status: Abnormal (Preliminary result)   Collection Time: 02/03/17  8:04 PM  Result Value Ref Range Status   Specimen Description BLOOD RIGHT HAND  Final   Special Requests   Final    BOTTLES DRAWN AEROBIC AND ANAEROBIC Blood Culture adequate volume   Culture  Setup Time   Final    GRAM POSITIVE COCCI IN CHAINS Gram Stain Report Called to,Read Back By and Verified With: ELLIS J. AT 0910A ON [id number] BY THOMPSON S. Organism ID to follow CRITICAL RESULT CALLED TO, READ BACK BY AND VERIFIED WITH: L SEAY  02/04/17 @ 89 M VESTAL Performed at [facility], [place] [address], [place], [place] [address]    Culture STREPTOCOCCUS GROUP C (A)  Final   Report Status PENDING  Incomplete  Culture, blood (Routine x 2)     Status: None (Preliminary result)   Collection Time: 02/03/17  8:04 PM  Result Value Ref Range Status   Specimen Description RIGHT ANTECUBITAL  Final   Special Requests   Final    BOTTLES DRAWN AEROBIC AND ANAEROBIC Blood Culture adequate volume   Culture NO GROWTH 3 DAYS  Final   Report Status PENDING  Incomplete  Blood Culture ID Panel (Reflexed)     Status: Abnormal   Collection Time: 02/03/17  8:04 PM  Result Value Ref Range Status   Enterococcus species NOT DETECTED NOT DETECTED Final   Vancomycin resistance NOT DETECTED NOT DETECTED Final   Listeria monocytogenes NOT DETECTED NOT DETECTED Final   Staphylococcus species NOT DETECTED NOT DETECTED Final   Staphylococcus aureus NOT DETECTED NOT DETECTED Final   Methicillin resistance NOT DETECTED NOT DETECTED Final   Streptococcus species DETECTED (A) NOT DETECTED Final    Comment: CRITICAL RESULT CALLED TO, READ BACK BY AND VERIFIED WITH: L SEAY 02/04/17 @ 1413 M VESTAL    Streptococcus agalactiae NOT DETECTED NOT DETECTED Final   Streptococcus pneumoniae NOT DETECTED NOT DETECTED Final   Streptococcus pyogenes NOT DETECTED NOT DETECTED Final   Acinetobacter baumannii NOT DETECTED NOT DETECTED Final   Enterobacteriaceae species NOT DETECTED NOT DETECTED Final   Enterobacter cloacae complex NOT DETECTED NOT DETECTED Final   Escherichia coli NOT DETECTED NOT DETECTED Final   Klebsiella oxytoca NOT DETECTED NOT DETECTED Final   Klebsiella pneumoniae NOT DETECTED NOT DETECTED Final   Proteus species NOT DETECTED NOT DETECTED Final   Serratia marcescens NOT DETECTED NOT DETECTED Final   Carbapenem resistance NOT DETECTED NOT DETECTED Final   Haemophilus influenzae NOT DETECTED NOT DETECTED Final   Neisseria meningitidis  NOT DETECTED NOT DETECTED Final   Pseudomonas aeruginosa NOT DETECTED NOT DETECTED Final   Candida albicans NOT DETECTED NOT DETECTED Final   Candida glabrata NOT DETECTED NOT DETECTED Final   Candida krusei NOT DETECTED NOT DETECTED Final   Candida parapsilosis NOT DETECTED NOT DETECTED Final   Candida tropicalis NOT DETECTED NOT DETECTED Final    Comment: Performed at [facility], [place] [address] [place], [place] [address]  Culture, blood (routine x 2)     Status: None (Preliminary result)   Collection Time: 02/05/17  7:39 AM  Result Value Ref Range Status   Specimen Description RIGHT ANTECUBITAL  Final   Special Requests   Final    BOTTLES DRAWN AEROBIC ONLY Blood Culture results may not be optimal due to an inadequate volume of blood received in culture bottles   Culture NO GROWTH < 24 HOURS  Final   Report Status PENDING  Incomplete  Culture, blood (routine x 2)     Status: None (Preliminary result)   Collection Time: 02/05/17  7:46 AM  Result Value Ref Range Status   Specimen Description RIGHT ANTECUBITAL  Final   Special Requests   Final    BOTTLES DRAWN AEROBIC AND ANAEROBIC Blood Culture results may not be optimal due to an inadequate volume of blood received in culture bottles   Culture NO GROWTH < 24 HOURS  Final   Report Status PENDING  Incomplete         Radiology Studies: Dg Chest Port 1 View  Result Date: 02/04/2017 CLINICAL DATA:  Sepsis. EXAM: PORTABLE CHEST 1 VIEW COMPARISON:  Chest x-ray 02/03/2017. FINDINGS: Mediastinum and hilar structures normal. Cardiomegaly. Bibasilar atelectasis. No pleural effusion or pneumothorax IMPRESSION: Cardiomegaly. No pulmonary venous congestion . Bibasilar atelectasis. Electronically Signed   By: [place]   On: 02/04/2017 15:17        Scheduled Meds: . apixaban  5 mg Oral BID  . ferrous sulfate  325 mg Oral Daily  . metoprolol tartrate  25 mg Oral BID  . multivitamin with minerals  1 tablet Oral Daily  .  pantoprazole  40 mg Oral Daily  . pravastatin  40 mg Oral Daily  . tamsulosin  0.4 mg Oral QPM  Continuous Infusions: . piperacillin-tazobactam (ZOSYN)  IV 3.375 g (02/06/17 0612)  . vancomycin Stopped (02/05/17 2317)     LOS: 3 days    Time spent: 22mins    Tirso Laws, MD [facility] Pager [phone]  If 7PM-7AM, please contact night-coverage [email or web address] Password [email or web address] 02/06/2017, 9:16 AM

## 2017-02-06 NOTE — Progress Notes (Signed)
Pt has AP CPAP plugged into red outlet. 4L O2 in line.

## 2017-02-07 ENCOUNTER — Inpatient Hospital Stay (HOSPITAL_COMMUNITY): Payer: Medicare HMO

## 2017-02-07 LAB — BASIC METABOLIC PANEL
Anion gap: 6 (ref 5–15)
BUN: 13 mg/dL (ref 6–20)
CHLORIDE: 104 mmol/L (ref 101–111)
CO2: 23 mmol/L (ref 22–32)
CREATININE: 0.75 mg/dL (ref 0.61–1.24)
Calcium: 8 mg/dL — ABNORMAL LOW (ref 8.9–10.3)
GFR calc non Af Amer: >60 mL/min (ref >60)
Glucose, Bld: 111 mg/dL — ABNORMAL HIGH (ref 65–99)
Potassium: 3.4 mmol/L — ABNORMAL LOW (ref 3.5–5.1)
Sodium: 133 mmol/L — ABNORMAL LOW (ref 135–145)

## 2017-02-07 LAB — CBC
HCT: 38.3 % — ABNORMAL LOW (ref 39.0–52.0)
Hemoglobin: 13.2 g/dL (ref 13.0–17.0)
MCH: 30 pg (ref 26.0–34.0)
MCHC: 34.5 g/dL (ref 30.0–36.0)
MCV: 87 fL (ref 78.0–100.0)
PLATELETS: 147 10*3/uL — AB (ref 150–400)
RBC: 4.4 MIL/uL (ref 4.22–5.81)
RDW: 14.2 % (ref 11.5–15.5)
WBC: 11.5 10*3/uL — ABNORMAL HIGH (ref 4.0–10.5)

## 2017-02-07 LAB — MRSA PCR SCREENING: MRSA by PCR: NEGATIVE

## 2017-02-07 MED ORDER — VANCOMYCIN HCL 10 G IV SOLR
1500.0000 mg | Freq: Two times a day (BID) | INTRAVENOUS | Status: DC
  Filled 2017-02-07 (×7): qty 1500

## 2017-02-07 MED ORDER — ALUM & MAG HYDROXIDE-SIMETH 200-200-20 MG/5ML PO SUSP
15.0000 mL | Freq: Once | ORAL | Status: AC
  Administered 2017-02-07: 15 mL via ORAL
  Filled 2017-02-07: qty 30

## 2017-02-07 MED ORDER — CEFAZOLIN SODIUM-DEXTROSE 2-4 GM/100ML-% IV SOLN
2.0000 g | Freq: Three times a day (TID) | INTRAVENOUS | Status: DC
  Administered 2017-02-07 – 2017-02-09 (×7): 2 g via INTRAVENOUS
  Filled 2017-02-07 (×10): qty 100

## 2017-02-07 NOTE — Progress Notes (Signed)
PROGRESS NOTE    Gary Carroll  TKZ:[id number] DOB: Jul 04, 1945 DOA: 02/03/2017 PCP: Celene Squibb, MD    Brief Narrative:  71 year old male admitted with left lower extremity cellulitis and associated sepsis. On intravenous antibiotics. Mental status worse due to underlying infection. Slowly improving with IV antibiotics and fluids. Continue current treatments.   Assessment & Plan:   Principal Problem:   Cellulitis of left lower extremity Active Problems:   Essential hypertension, benign   Mixed hyperlipidemia   GERD (gastroesophageal reflux disease)   AF (paroxysmal atrial fibrillation) (HCC)   Sepsis due to cellulitis (HCC)   BPH (benign prostatic hyperplasia)   Urinary tract infection   1. Cellulitis left lower extremity. Patient had another high fever overnight to 102.9. Continues to have significant erythema on left lower extremity, but it does not appear to be extending beyond marked borders. Continue IV antibiotics. Keep lower extremity elevated. Check venous ultrasound to rule out underlying DVT 2. Streptococcal sepsis. One out of 2 blood cultures positive for Streptococcus. Repeat blood cultures drawn yesterday have shown no growth. Source is cellulitis. Hemodynamics are currently stable. Lactic acid has trended down. Discussed with Dr. Linus Salmons on call for infectious disease. Recommended changing antibiotics to Ancef, and changing to by mouth once clinically improved 3. Chronic diastolic congestive heart failure. Appears compensated. Continue current treatments. 4. Acute metabolic encephalopathy. Likely related to acute illness. Mental status appears to be improving. Continue to monitor. 5. Chronic atrial fibrillation. Continue on metoprolol and apixaban for anticoagulation 6. Hyperlipidemia. Continue statin  7. hypertension. Blood pressures are stable. Continue current treatments.    DVT prophylaxis: apixaban Code Status: Full code Family Communication: No family  present Disposition Plan: Discharge home once improved   Consultants:     Procedures:     Antimicrobials:   Vancomycin 9/17 >9/21  Zosyn 9/17 >9/21  Ancef 9/21>>   Subjective: Still having discomfort in his left lower extremity. Wants to get up out of bed  Objective: Vitals:   02/06/17 2239 02/07/17 0000 02/07/17 0500 02/07/17 0740  BP:   130/65   Pulse:   97   Resp:      Temp:  (!) 97.5 F (36.4 C) 99 F (37.2 C) 98.2 F (36.8 C)  TempSrc:  Oral Oral Oral  SpO2: 95%  95%   Weight:   116.5 kg (256 lb 13.4 oz)   Height:        Intake/Output Summary (Last 24 hours) at 02/07/17 1000 Last data filed at 02/07/17 0900  Gross per 24 hour  Intake             1590 ml  Output             2050 ml  Net             -460 ml   Filed Weights   02/05/17 0500 02/06/17 0500 02/07/17 0500  Weight: 120.3 kg (265 lb 3.4 oz) 118.5 kg (261 lb 3.9 oz) 116.5 kg (256 lb 13.4 oz)    Examination:  General exam: Appears calm and comfortable  Respiratory system: Clear to auscultation. Respiratory effort normal. Cardiovascular system: S1 & S2 heard, irregular. No JVD, murmurs, rubs, gallops or clicks. Gastrointestinal system: Abdomen is nondistended, soft and nontender. No organomegaly or masses felt. Normal bowel sounds heard. Central nervous system: Alert and oriented. No focal neurological deficits. Extremities: Symmetric 5 x 5 power. Skin: Erythema noted on left lower extremity that does not appear to be extending beyond marked borders,  still warm and mildly tender, significant edema  Psychiatry: appropriate, cooperative, alert and oriented    Data Reviewed: I have personally reviewed following labs and imaging studies  CBC:  Recent Labs Lab 02/03/17 1919 02/04/17 0348 02/05/17 0354 02/06/17 0553 02/07/17 0523  WBC 11.7* 16.6* 18.3* 11.5* 11.5*  NEUTROABS 10.4* 15.4* 17.2* 10.3*  --   HGB 15.5 14.3 13.7 13.9 13.2  HCT 46.3 42.1 41.1 40.7 38.3*  MCV 89.7 90.1 89.5  88.3 87.0  PLT 175 147* 131* 117* 093*   Basic Metabolic Panel:  Recent Labs Lab 02/03/17 1919 02/03/17 2004 02/04/17 0348 02/05/17 0354 02/06/17 0553 02/07/17 0523  NA 137  --  138 133* 133* 133*  K 3.8  --  3.7 3.8 3.7 3.4*  CL 102  --  107 104 103 104  CO2 26  --  21* 22 23 23   GLUCOSE 117*  --  92 120* 123* 111*  BUN 18  --  16 15 16 13   CREATININE 0.97  --  0.99 0.87 0.73 0.75  CALCIUM 9.5  --  8.4* 8.3* 8.1* 8.0*  MG  --  1.6*  --  1.9  --   --    GFR: Estimated Creatinine Clearance: 111.6 mL/min (by C-G formula based on SCr of 0.75 mg/dL). Liver Function Tests:  Recent Labs Lab 02/03/17 1919  AST 34  ALT 39  ALKPHOS 72  BILITOT 0.8  PROT 7.4  ALBUMIN 4.1   No results for input(s): LIPASE, AMYLASE in the last 168 hours. No results for input(s): AMMONIA in the last 168 hours. Coagulation Profile:  Recent Labs Lab 02/03/17 1919  INR 1.14   Cardiac Enzymes:  Recent Labs Lab 02/03/17 2004  TROPONINI <0.03   BNP (last 3 results) No results for input(s): PROBNP in the last 8760 hours. HbA1C: No results for input(s): HGBA1C in the last 72 hours. CBG: No results for input(s): GLUCAP in the last 168 hours. Lipid Profile: No results for input(s): CHOL, HDL, LDLCALC, TRIG, CHOLHDL, LDLDIRECT in the last 72 hours. Thyroid Function Tests: No results for input(s): TSH, T4TOTAL, FREET4, T3FREE, THYROIDAB in the last 72 hours. Anemia Panel: No results for input(s): VITAMINB12, FOLATE, FERRITIN, TIBC, IRON, RETICCTPCT in the last 72 hours. Sepsis Labs:  Recent Labs Lab 02/04/17 0348 02/04/17 0759 02/04/17 1112 02/05/17 0354  LATICACIDVEN 2.9* 2.6* 1.8 1.9    Recent Results (from the past 240 hour(s))  Culture, blood (Routine x 2)     Status: Abnormal   Collection Time: 02/03/17  8:04 PM  Result Value Ref Range Status   Specimen Description BLOOD RIGHT HAND  Final   Special Requests   Final    BOTTLES DRAWN AEROBIC AND ANAEROBIC Blood Culture  adequate volume   Culture  Setup Time   Final    GRAM POSITIVE COCCI IN CHAINS Gram Stain Report Called to,Read Back By and Verified With: ELLIS J. AT 0910A ON [id number] BY THOMPSON S. CRITICAL RESULT CALLED TO, READ BACK BY AND VERIFIED WITH: L SEAY 02/04/17 @ 49 M VESTAL Performed at [facility], [place] [address], [place], [place] [address]    Culture STREPTOCOCCUS DYSGALACTIAE (A)  Final   Report Status 02/06/2017 FINAL  Final   Organism ID, Bacteria STREPTOCOCCUS DYSGALACTIAE  Final      Susceptibility   Streptococcus dysgalactiae - MIC*    CLINDAMYCIN >=1 RESISTANT Resistant     AMPICILLIN 4      ERYTHROMYCIN >=8 RESISTANT Resistant  VANCOMYCIN 0.5 SENSITIVE Sensitive     CEFTRIAXONE <=0.12 SENSITIVE Sensitive     LEVOFLOXACIN 0.5 SENSITIVE Sensitive     * STREPTOCOCCUS DYSGALACTIAE  Culture, blood (Routine x 2)     Status: None (Preliminary result)   Collection Time: 02/03/17  8:04 PM  Result Value Ref Range Status   Specimen Description RIGHT ANTECUBITAL  Final   Special Requests   Final    BOTTLES DRAWN AEROBIC AND ANAEROBIC Blood Culture adequate volume   Culture NO GROWTH 4 DAYS  Final   Report Status PENDING  Incomplete  Blood Culture ID Panel (Reflexed)     Status: Abnormal   Collection Time: 02/03/17  8:04 PM  Result Value Ref Range Status   Enterococcus species NOT DETECTED NOT DETECTED Final   Vancomycin resistance NOT DETECTED NOT DETECTED Final   Listeria monocytogenes NOT DETECTED NOT DETECTED Final   Staphylococcus species NOT DETECTED NOT DETECTED Final   Staphylococcus aureus NOT DETECTED NOT DETECTED Final   Methicillin resistance NOT DETECTED NOT DETECTED Final   Streptococcus species DETECTED (A) NOT DETECTED Final    Comment: CRITICAL RESULT CALLED TO, READ BACK BY AND VERIFIED WITH: L SEAY 02/04/17 @ 1413 M VESTAL    Streptococcus agalactiae NOT DETECTED NOT DETECTED Final   Streptococcus pneumoniae NOT DETECTED NOT DETECTED Final    Streptococcus pyogenes NOT DETECTED NOT DETECTED Final   Acinetobacter baumannii NOT DETECTED NOT DETECTED Final   Enterobacteriaceae species NOT DETECTED NOT DETECTED Final   Enterobacter cloacae complex NOT DETECTED NOT DETECTED Final   Escherichia coli NOT DETECTED NOT DETECTED Final   Klebsiella oxytoca NOT DETECTED NOT DETECTED Final   Klebsiella pneumoniae NOT DETECTED NOT DETECTED Final   Proteus species NOT DETECTED NOT DETECTED Final   Serratia marcescens NOT DETECTED NOT DETECTED Final   Carbapenem resistance NOT DETECTED NOT DETECTED Final   Haemophilus influenzae NOT DETECTED NOT DETECTED Final   Neisseria meningitidis NOT DETECTED NOT DETECTED Final   Pseudomonas aeruginosa NOT DETECTED NOT DETECTED Final   Candida albicans NOT DETECTED NOT DETECTED Final   Candida glabrata NOT DETECTED NOT DETECTED Final   Candida krusei NOT DETECTED NOT DETECTED Final   Candida parapsilosis NOT DETECTED NOT DETECTED Final   Candida tropicalis NOT DETECTED NOT DETECTED Final    Comment: Performed at [facility], [place]. [address] [place], [place] [address]  Culture, blood (routine x 2)     Status: None (Preliminary result)   Collection Time: 02/05/17  7:39 AM  Result Value Ref Range Status   Specimen Description RIGHT ANTECUBITAL  Final   Special Requests   Final    BOTTLES DRAWN AEROBIC ONLY Blood Culture results may not be optimal due to an inadequate volume of blood received in culture bottles   Culture NO GROWTH 2 DAYS  Final   Report Status PENDING  Incomplete  Culture, blood (routine x 2)     Status: None (Preliminary result)   Collection Time: 02/05/17  7:46 AM  Result Value Ref Range Status   Specimen Description RIGHT ANTECUBITAL  Final   Special Requests   Final    BOTTLES DRAWN AEROBIC AND ANAEROBIC Blood Culture results may not be optimal due to an inadequate volume of blood received in culture bottles   Culture NO GROWTH 2 DAYS  Final   Report Status PENDING   Incomplete  MRSA PCR Screening     Status: None   Collection Time: 02/07/17 12:01 AM  Result Value Ref Range  Status   MRSA by PCR NEGATIVE NEGATIVE Final    Comment:        The GeneXpert MRSA Assay (FDA approved for NASAL specimens only), is one component of a comprehensive MRSA colonization surveillance program. It is not intended to diagnose MRSA infection nor to guide or monitor treatment for MRSA infections.          Radiology Studies: No results found.      Scheduled Meds: . apixaban  5 mg Oral BID  . ferrous sulfate  325 mg Oral Daily  . metoprolol tartrate  25 mg Oral BID  . multivitamin with minerals  1 tablet Oral Daily  . pantoprazole  40 mg Oral Daily  . pravastatin  40 mg Oral Daily  . tamsulosin  0.4 mg Oral QPM   Continuous Infusions:    LOS: 4 days    Time spent: 1mins    MEMON,JEHANZEB, MD [facility] Pager [phone]  If 7PM-7AM, please contact night-coverage [email or web address] Password [email or web address] 02/07/2017, 10:00 AM

## 2017-02-07 NOTE — Care Management Important Message (Signed)
Important Message  Patient Details  Name: Gary Carroll MRN: [id number] Date of Birth: Dec 25, 1945   Medicare Important Message Given:  Yes    Lashawndra Lampkins, [name], RN 02/07/2017, 1:54 PM

## 2017-02-07 NOTE — Progress Notes (Addendum)
Keys for Vancomycin and Zosyn Indication: Cellulitis, Sepsis, Bacteremia  Allergies  Allergen Reactions  . Diovan [Valsartan] Cough  . Tagamet [Cimetidine] Other (See Comments)    irrittability   Patient Measurements: Height: 6' (182.9 cm) Weight: 256 lb 13.4 oz (116.5 kg) IBW/kg (Calculated) : 77.6  Vital Signs: Temp: 98.2 F (36.8 C) (09/21 0740) Temp Source: Oral (09/21 0740) BP: 130/65 (09/21 0500) Pulse Rate: 97 (09/21 0500)  Labs:  Recent Labs  02/05/17 0354 02/06/17 0553 02/07/17 0523  WBC 18.3* 11.5* 11.5*  HGB 13.7 13.9 13.2  PLT 131* 117* 147*  CREATININE 0.87 0.73 0.75   Estimated Creatinine Clearance: 111.6 mL/min (by C-G formula based on SCr of 0.75 mg/dL).   Recent Labs  02/06/17 2049  [place] 7*     Microbiology: Recent Results (from the past 720 hour(s))  Culture, blood (Routine x 2)     Status: Abnormal   Collection Time: 02/03/17  8:04 PM  Result Value Ref Range Status   Specimen Description BLOOD RIGHT HAND  Final   Special Requests   Final    BOTTLES DRAWN AEROBIC AND ANAEROBIC Blood Culture adequate volume   Culture  Setup Time   Final    GRAM POSITIVE COCCI IN CHAINS Gram Stain Report Called to,Read Back By and Verified With: ELLIS J. AT 0910A ON [id number] BY THOMPSON S. CRITICAL RESULT CALLED TO, READ BACK BY AND VERIFIED WITH: L Andree Golphin 02/04/17 @ 73 M VESTAL Performed at [facility], [place] [address], [place], [place] [address]    Culture STREPTOCOCCUS DYSGALACTIAE (A)  Final   Report Status 02/06/2017 FINAL  Final   Organism ID, Bacteria STREPTOCOCCUS DYSGALACTIAE  Final      Susceptibility   Streptococcus dysgalactiae - MIC*    CLINDAMYCIN >=1 RESISTANT Resistant     AMPICILLIN 4      ERYTHROMYCIN >=8 RESISTANT Resistant     VANCOMYCIN 0.5 SENSITIVE Sensitive     CEFTRIAXONE <=0.12 SENSITIVE Sensitive     LEVOFLOXACIN 0.5 SENSITIVE Sensitive     * STREPTOCOCCUS DYSGALACTIAE   Culture, blood (Routine x 2)     Status: None (Preliminary result)   Collection Time: 02/03/17  8:04 PM  Result Value Ref Range Status   Specimen Description RIGHT ANTECUBITAL  Final   Special Requests   Final    BOTTLES DRAWN AEROBIC AND ANAEROBIC Blood Culture adequate volume   Culture NO GROWTH 4 DAYS  Final   Report Status PENDING  Incomplete  Blood Culture ID Panel (Reflexed)     Status: Abnormal   Collection Time: 02/03/17  8:04 PM  Result Value Ref Range Status   Enterococcus species NOT DETECTED NOT DETECTED Final   Vancomycin resistance NOT DETECTED NOT DETECTED Final   Listeria monocytogenes NOT DETECTED NOT DETECTED Final   Staphylococcus species NOT DETECTED NOT DETECTED Final   Staphylococcus aureus NOT DETECTED NOT DETECTED Final   Methicillin resistance NOT DETECTED NOT DETECTED Final   Streptococcus species DETECTED (A) NOT DETECTED Final    Comment: CRITICAL RESULT CALLED TO, READ BACK BY AND VERIFIED WITH: L Fredrick Geoghegan 02/04/17 @ 1413 M VESTAL    Streptococcus agalactiae NOT DETECTED NOT DETECTED Final   Streptococcus pneumoniae NOT DETECTED NOT DETECTED Final   Streptococcus pyogenes NOT DETECTED NOT DETECTED Final   Acinetobacter baumannii NOT DETECTED NOT DETECTED Final   Enterobacteriaceae species NOT DETECTED NOT DETECTED Final   Enterobacter cloacae complex NOT DETECTED NOT DETECTED Final   Escherichia  coli NOT DETECTED NOT DETECTED Final   Klebsiella oxytoca NOT DETECTED NOT DETECTED Final   Klebsiella pneumoniae NOT DETECTED NOT DETECTED Final   Proteus species NOT DETECTED NOT DETECTED Final   Serratia marcescens NOT DETECTED NOT DETECTED Final   Carbapenem resistance NOT DETECTED NOT DETECTED Final   Haemophilus influenzae NOT DETECTED NOT DETECTED Final   Neisseria meningitidis NOT DETECTED NOT DETECTED Final   Pseudomonas aeruginosa NOT DETECTED NOT DETECTED Final   Candida albicans NOT DETECTED NOT DETECTED Final   Candida glabrata NOT DETECTED NOT  DETECTED Final   Candida krusei NOT DETECTED NOT DETECTED Final   Candida parapsilosis NOT DETECTED NOT DETECTED Final   Candida tropicalis NOT DETECTED NOT DETECTED Final    Comment: Performed at [facility], [place] [address] [place], [place] [address]  Culture, blood (routine x 2)     Status: None (Preliminary result)   Collection Time: 02/05/17  7:39 AM  Result Value Ref Range Status   Specimen Description RIGHT ANTECUBITAL  Final   Special Requests   Final    BOTTLES DRAWN AEROBIC ONLY Blood Culture results may not be optimal due to an inadequate volume of blood received in culture bottles   Culture NO GROWTH 2 DAYS  Final   Report Status PENDING  Incomplete  Culture, blood (routine x 2)     Status: None (Preliminary result)   Collection Time: 02/05/17  7:46 AM  Result Value Ref Range Status   Specimen Description RIGHT ANTECUBITAL  Final   Special Requests   Final    BOTTLES DRAWN AEROBIC AND ANAEROBIC Blood Culture results may not be optimal due to an inadequate volume of blood received in culture bottles   Culture NO GROWTH 2 DAYS  Final   Report Status PENDING  Incomplete  MRSA PCR Screening     Status: None   Collection Time: 02/07/17 12:01 AM  Result Value Ref Range Status   MRSA by PCR NEGATIVE NEGATIVE Final    Comment:        The GeneXpert MRSA Assay (FDA approved for NASAL specimens only), is one component of a comprehensive MRSA colonization surveillance program. It is not intended to diagnose MRSA infection nor to guide or monitor treatment for MRSA infections.    Medical History: Past Medical History:  Diagnosis Date  . Allergic rhinitis   . Arthritis   . Asthma    seasonal per pt  . Bilateral knee pain    OA  . Diverticulosis 02/2012   seen on colonoscopy  . Erectile dysfunction   . GERD (gastroesophageal reflux disease)   . Heart murmur   . Hiatal hernia   . HLD (hyperlipidemia)   . HTN (hypertension)   . Hydronephrosis determined by  ultrasound 08/22/2015  . Overactive bladder   . Prediabetes   . Ringing of ears   . Urinary frequency   . Urinary urgency   . Wears glasses    Medications:  Vancomycin 1 Gm IV given in the ED Zosyn 3.375 Gm IV given in the ED  Assessment: 71 yo obese male seen in the ED with complaints of fever, chills, and cough. Pt has LLE tenderness and erythema. Pharmacy asked to dose Vancomycin and Zosyn for cellulitis, with sepsis protocol initiated. Vanc trough last night 7 mcg/ml  Goal of Therapy:  Vancomycin troughs 15-20 mcg/ml Eradicate infection  Plan:  Change vanc to 1500 mg IV q12hrs Continue Zosyn 3.375gm IV q8h, EID Monitor labs, progress, c/s  Thanks for allowing pharmacy to be a part of this patient's care.  Excell Seltzer, PharmD Clinical Pharmacist 02/07/2017,7:53 AM   Change antibiotics to ancef 2gm IV q8 hours

## 2017-02-07 NOTE — Evaluation (Signed)
Physical Therapy Evaluation Patient Details Name: Gary Carroll MRN: [id number] DOB: 27-Apr-1946 Today's Date: 02/07/2017   History of Present Illness  Gary Carroll is a 71 y.o. male with medical history significant of Allergic rhinitis, osteoarthritis, asthma, diverticulosis, GERD, hiatal hernia, hyperlipidemia, hypertension, history of hydronephrosis, prediabetes who is coming to the emergency department with complaints of fever of 100.36F at home associated with chills and cough. He also complains of frontal headache, but denies rhinorrhea, sore throat, chest pain, palpitations, dizziness, diaphoresis, orthopnea, abdominal pain, nausea, emesis, diarrhea or constipation. He denies dysuria, flank pain or hematuria, but complains of frequency. He denies polyuria, polydipsia or blurry vision. He has been visible left lower extremity erythema, but denies significant discomfort from it.  Clinical Impression  Pt received in bed and was agreeable to PT evaluation. Pt from home alone where he was independent with ADLs, IADLs, driving, and ambulation with and without RW. He verbalized that his community ambulation has been limited due to knee pain since his most recent TKA. Pt currently mod A for supine > sit for BLE and upper body negotiation. Once EOB, pt had increased posterior lean which required verbal and tactile cues to correct. After a few mins of sitting EOB, pt was able to attain and maintain control of his sitting balance. Pt was a strong max A for sit <> stand from EOB with RW. He continued to try and pull on the RW to assist with transfer but PT had to keep reminding pt that this was unsafe due to the RW potentially tipping backwards and could cause LOB. Once standing, pt able to maintain his balance well. Ambulation not attempted this date due to pt's significant mobility deficits, safety concerns, and pt's fatigue. Pt wished to return to bed which required mod A for LEs sit > supine. PT  attempted to perform bed level exercises with pt, however, upon immediate return to supine position, pt began making phone calls trying to get his aunt to bring him pizza for dinner. Pt remained on the phone for quite some time so PT instructed pt to perform ankle pumps as frequently as possible to help with decreasing his edema; he verbalized understanding. Due to significant mobility deficits, PT recommending SNF upon d/c. Pt did not disagree with this plan, but he did state that his only concern with going is his financial status. PT educated pt that the case managers would discuss options with him regarding this. Will continue to follow acutely.     Follow Up Recommendations SNF    Equipment Recommendations  None recommended by PT    Recommendations for Other Services       Precautions / Restrictions Precautions Precautions: Fall Precaution Comments: significant weakness and mobility deficits      Mobility  Bed Mobility Overal bed mobility: Needs Assistance Bed Mobility: Supine to Sit;Sit to Supine     Supine to sit: Mod assist;HOB elevated Sit to supine: Mod assist   General bed mobility comments: LEs and upper body negotiation for supine > sit, mod for LEs sit > supine  Transfers Overall transfer level: Needs assistance Equipment used: Rolling walker (2 wheeled) Transfers: Sit to/from Stand Sit to Stand: Max assist         General transfer comment: pt wanting to pull on RW to stand up; cues for proper technique, max A for sit > stand; once in upright erect position, pt stable  Ambulation/Gait  General Gait Details: n/a this date due to significant mobility deficits, safety concerns, and pt fatigue  Stairs            Wheelchair Mobility    Modified Rankin (Stroke Patients Only)       Balance Overall balance assessment: Needs assistance Sitting-balance support: Feet supported Sitting balance-Leahy Scale: Fair   Postural control:  Posterior lean Standing balance support: Bilateral upper extremity supported Standing balance-Leahy Scale: Fair Standing balance comment: once upright pt able to maintain standing balance with RW                             Pertinent Vitals/Pain Pain Assessment: 0-10 Pain Score: 5  Pain Location: "all over" Pain Descriptors / Indicators: Aching Pain Intervention(s): Limited activity within patient's tolerance;Monitored during session;Repositioned    Home Living Family/patient expects to be discharged to:: Private residence Living Arrangements: Alone   Type of Home: Apartment Home Access: Stairs to enter Entrance Stairs-Rails: Right Entrance Stairs-Number of Steps: 4 STE Home Layout: One level Home Equipment: Walker - 2 wheels;Cane - single point;Bedside commode      Prior Function Level of Independence: Independent with assistive device(s)         Comments: has to use the RW first thing in the morning due to stiffness but once he gets moving he can ambulate without AD; knee pain limited his community ambulation; independent with dressing, bathing, driving     Hand Dominance   Dominant Hand: Right    Extremity/Trunk Assessment   Upper Extremity Assessment Upper Extremity Assessment: Generalized weakness    Lower Extremity Assessment Lower Extremity Assessment: Generalized weakness    Cervical / Trunk Assessment Cervical / Trunk Assessment: Kyphotic  Communication   Communication: No difficulties  Cognition Arousal/Alertness: Awake/alert Behavior During Therapy: WFL for tasks assessed/performed Overall Cognitive Status: Within Functional Limits for tasks assessed                                        General Comments      Exercises Total Joint Exercises Ankle Circles/Pumps: Both;10 reps;Limitations Ankle Circles/Pumps Limitations: cued to continue ankle pumps very frequently to help with fluid   Assessment/Plan    PT  Assessment Patient needs continued PT services  PT Problem List Decreased strength;Decreased activity tolerance;Decreased balance;Decreased mobility;Decreased safety awareness;Obesity;Pain       PT Treatment Interventions DME instruction;Gait training;Functional mobility training;Stair training;Therapeutic activities;Therapeutic exercise;Neuromuscular re-education;Patient/family education    PT Goals (Current goals can be found in the Care Plan section)  Acute Rehab PT Goals Patient Stated Goal: to go home PT Goal Formulation: With patient Time For Goal Achievement: 02/14/17 Potential to Achieve Goals: Fair    Frequency Min 3X/week   Barriers to discharge Decreased caregiver support      Co-evaluation               AM-PAC PT "6 Clicks" Daily Activity  Outcome Measure Difficulty turning over in bed (including adjusting bedclothes, sheets and blankets)?: A Lot Difficulty moving from lying on back to sitting on the side of the bed? : A Lot Difficulty sitting down on and standing up from a chair with arms (e.g., wheelchair, bedside commode, etc,.)?: A Lot Help needed moving to and from a bed to chair (including a wheelchair)?: Total Help needed walking in hospital room?: Total Help needed climbing 3-5 steps  with a railing? : Total 6 Click Score: 9    End of Session Equipment Utilized During Treatment: Gait belt Activity Tolerance: Patient tolerated treatment well;Patient limited by fatigue Patient left: in bed;with call bell/phone within reach Nurse Communication: Mobility status;Precautions PT Visit Diagnosis: Unsteadiness on feet (R26.81);Muscle weakness (generalized) (M62.81);Difficulty in walking, not elsewhere classified (R26.2)    Time: 3428-7681 PT Time Calculation (min) (ACUTE ONLY): 35 min   Charges:   PT Evaluation $PT Eval Low Complexity: 1 Low PT Treatments $Therapeutic Activity: 8-22 mins   PT G Codes:   PT G-Codes **NOT FOR INPATIENT  CLASS** Functional Assessment Tool Used: AM-PAC 6 Clicks Basic Mobility;Clinical judgement Functional Limitation: Mobility: Walking and moving around Mobility: Walking and Moving Around Current Status (L5726): At least 60 percent but less than 80 percent impaired, limited or restricted Mobility: Walking and Moving Around Goal Status [phone]): At least 40 percent but less than 60 percent impaired, limited or restricted      Geraldine Solar PT, DPT

## 2017-02-07 NOTE — Care Management (Signed)
Patient will not need IV antibiotics at discharge. PT eval pending. CM following for ongoing needs.

## 2017-02-07 NOTE — Progress Notes (Signed)
PT TRANSFERRING TO ROOM 330 AS MED/SURG..LLE REMAINS EDEMATOUS AND WARM TO TOUCH. ELEVATED ON PILLOW. IV PATENT. EXTERNAL CATH IN PLACE AND DRAINING CLEAR YELLOW URINE. TRANSFER REPORT CALLED TO CICELY ALSTON RN. PT WILL BE TRANSFERRED AFTER [place] PREFORMED AND EATS LUNCH.

## 2017-02-07 NOTE — Progress Notes (Signed)
Pt removed CPAP and doesn't wish to wear it for tonight.

## 2017-02-08 DIAGNOSIS — A409 Streptococcal sepsis, unspecified: Secondary | ICD-10-CM

## 2017-02-08 LAB — CBC
HEMATOCRIT: 36.8 % — AB (ref 39.0–52.0)
HEMOGLOBIN: 12.4 g/dL — AB (ref 13.0–17.0)
MCH: 29.7 pg (ref 26.0–34.0)
MCHC: 33.7 g/dL (ref 30.0–36.0)
MCV: 88 fL (ref 78.0–100.0)
Platelets: 160 10*3/uL (ref 150–400)
RBC: 4.18 MIL/uL — AB (ref 4.22–5.81)
RDW: 14.2 % (ref 11.5–15.5)
WBC: 11.8 10*3/uL — ABNORMAL HIGH (ref 4.0–10.5)

## 2017-02-08 LAB — BASIC METABOLIC PANEL
ANION GAP: 6 (ref 5–15)
BUN: 13 mg/dL (ref 6–20)
CALCIUM: 7.8 mg/dL — AB (ref 8.9–10.3)
CO2: 24 mmol/L (ref 22–32)
Chloride: 104 mmol/L (ref 101–111)
Creatinine, Ser: 0.76 mg/dL (ref 0.61–1.24)
GLUCOSE: 156 mg/dL — AB (ref 65–99)
POTASSIUM: 3.1 mmol/L — AB (ref 3.5–5.1)
Sodium: 134 mmol/L — ABNORMAL LOW (ref 135–145)

## 2017-02-08 LAB — CULTURE, BLOOD (ROUTINE X 2)
Culture: NO GROWTH
SPECIAL REQUESTS: ADEQUATE

## 2017-02-08 MED ORDER — POLYETHYLENE GLYCOL 3350 17 G PO PACK
17.0000 g | PACK | Freq: Every day | ORAL | Status: DC
  Administered 2017-02-08: 17 g via ORAL
  Filled 2017-02-08 (×2): qty 1

## 2017-02-08 MED ORDER — POTASSIUM CHLORIDE CRYS ER 20 MEQ PO TBCR
40.0000 meq | EXTENDED_RELEASE_TABLET | ORAL | Status: AC
  Administered 2017-02-08 (×2): 40 meq via ORAL
  Filled 2017-02-08 (×2): qty 2

## 2017-02-08 NOTE — Progress Notes (Signed)
PROGRESS NOTE    Gary Carroll  XBL:[id number] DOB: January 06, 1946 DOA: 02/03/2017 PCP: Celene Squibb, MD    Brief Narrative:  71 year old male admitted with left lower extremity cellulitis and associated sepsis. On intravenous antibiotics. Mental status worse due to underlying infection. Slowly improving with IV antibiotics and fluids. Continue current treatments.   Assessment & Plan:   Principal Problem:   Cellulitis of left lower extremity Active Problems:   Essential hypertension, benign   Mixed hyperlipidemia   GERD (gastroesophageal reflux disease)   AF (paroxysmal atrial fibrillation) (HCC)   Sepsis due to cellulitis (HCC)   BPH (benign prostatic hyperplasia)   Urinary tract infection   1. Cellulitis left lower extremity. Overall fevers improving. Continues to have significant erythema on left lower extremity. Extremity remains significantly swollen, tender and warm. Continue IV antibiotics. Keep lower extremity elevated. Venous dopplers negative for DVT 2. Streptococcal sepsis. One out of 2 blood cultures positive for Streptococcus. Repeat blood cultures have shown no growth. Source is cellulitis. Hemodynamics are currently stable. Lactic acid has trended down. Discussed with Dr. Linus Salmons on call for infectious disease. Recommended changing antibiotics to Ancef, and changing to by mouth once clinically improved 3. Chronic diastolic congestive heart failure. Appears compensated. Continue current treatments. 4. Acute metabolic encephalopathy. Likely related to acute illness. Mental status appears to be improving. Continue to monitor. 5. Chronic atrial fibrillation. Continue on metoprolol and apixaban for anticoagulation 6. Hyperlipidemia. Continue statin  7. hypertension. Blood pressures are stable. Continue current treatments.    DVT prophylaxis: apixaban Code Status: Full code Family Communication: No family present Disposition Plan: Discharge home once  improved   Consultants:     Procedures:     Antimicrobials:   Vancomycin 9/17 >9/21  Zosyn 9/17 >9/21  Ancef 9/21>>   Subjective: Continued tenderness, swelling and erythema in left leg. No shortness of breath  Objective: Vitals:   02/08/17 0512 02/08/17 0737 02/08/17 1215 02/08/17 1400  BP: 135/63 137/68  (!) 149/70  Pulse: 80 80    Resp: 20 18  18   Temp: 98.1 F (36.7 C) 98.7 F (37.1 C)  98.4 F (36.9 C)  TempSrc: Oral Oral  Oral  SpO2: 97% 95% 94% 93%  Weight: 120.3 kg (265 lb 3.2 oz)     Height:        Intake/Output Summary (Last 24 hours) at 02/08/17 1807 Last data filed at 02/08/17 0515  Gross per 24 hour  Intake              560 ml  Output             1375 ml  Net             -815 ml   Filed Weights   02/06/17 0500 02/07/17 0500 02/08/17 0512  Weight: 118.5 kg (261 lb 3.9 oz) 116.5 kg (256 lb 13.4 oz) 120.3 kg (265 lb 3.2 oz)    Examination:  General exam: Appears calm and comfortable  Respiratory system: Clear to auscultation. Respiratory effort normal. Cardiovascular system: S1 & S2 heard, irregular. No JVD, murmurs, rubs, gallops or clicks. Gastrointestinal system: Abdomen is nondistended, soft and nontender. No organomegaly or masses felt. Normal bowel sounds heard. Central nervous system: Alert and oriented. No focal neurological deficits. Extremities: Symmetric 5 x 5 power. Skin: Erythema noted on left lower extremity that does not appear to be extending beyond marked borders, still warm and mildly tender, significant edema  Psychiatry: appropriate, cooperative, alert and oriented  Data Reviewed: I have personally reviewed following labs and imaging studies  CBC:  Recent Labs Lab 02/03/17 1919 02/04/17 0348 02/05/17 0354 02/06/17 0553 02/07/17 0523 02/08/17 0629  WBC 11.7* 16.6* 18.3* 11.5* 11.5* 11.8*  NEUTROABS 10.4* 15.4* 17.2* 10.3*  --   --   HGB 15.5 14.3 13.7 13.9 13.2 12.4*  HCT 46.3 42.1 41.1 40.7 38.3* 36.8*   MCV 89.7 90.1 89.5 88.3 87.0 88.0  PLT 175 147* 131* 117* 147* 741   Basic Metabolic Panel:  Recent Labs Lab 02/03/17 2004 02/04/17 0348 02/05/17 0354 02/06/17 0553 02/07/17 0523 02/08/17 0629  NA  --  138 133* 133* 133* 134*  K  --  3.7 3.8 3.7 3.4* 3.1*  CL  --  107 104 103 104 104  CO2  --  21* 22 23 23 24   GLUCOSE  --  92 120* 123* 111* 156*  BUN  --  16 15 16 13 13   CREATININE  --  0.99 0.87 0.73 0.75 0.76  CALCIUM  --  8.4* 8.3* 8.1* 8.0* 7.8*  MG 1.6*  --  1.9  --   --   --    GFR: Estimated Creatinine Clearance: 113.4 mL/min (by C-G formula based on SCr of 0.76 mg/dL). Liver Function Tests:  Recent Labs Lab 02/03/17 1919  AST 34  ALT 39  ALKPHOS 72  BILITOT 0.8  PROT 7.4  ALBUMIN 4.1   No results for input(s): LIPASE, AMYLASE in the last 168 hours. No results for input(s): AMMONIA in the last 168 hours. Coagulation Profile:  Recent Labs Lab 02/03/17 1919  INR 1.14   Cardiac Enzymes:  Recent Labs Lab 02/03/17 2004  TROPONINI <0.03   BNP (last 3 results) No results for input(s): PROBNP in the last 8760 hours. HbA1C: No results for input(s): HGBA1C in the last 72 hours. CBG: No results for input(s): GLUCAP in the last 168 hours. Lipid Profile: No results for input(s): CHOL, HDL, LDLCALC, TRIG, CHOLHDL, LDLDIRECT in the last 72 hours. Thyroid Function Tests: No results for input(s): TSH, T4TOTAL, FREET4, T3FREE, THYROIDAB in the last 72 hours. Anemia Panel: No results for input(s): VITAMINB12, FOLATE, FERRITIN, TIBC, IRON, RETICCTPCT in the last 72 hours. Sepsis Labs:  Recent Labs Lab 02/04/17 0348 02/04/17 0759 02/04/17 1112 02/05/17 0354  LATICACIDVEN 2.9* 2.6* 1.8 1.9    Recent Results (from the past 240 hour(s))  Culture, blood (Routine x 2)     Status: Abnormal   Collection Time: 02/03/17  8:04 PM  Result Value Ref Range Status   Specimen Description BLOOD RIGHT HAND  Final   Special Requests   Final    BOTTLES DRAWN AEROBIC  AND ANAEROBIC Blood Culture adequate volume   Culture  Setup Time   Final    GRAM POSITIVE COCCI IN CHAINS Gram Stain Report Called to,Read Back By and Verified With: ELLIS J. AT 0910A ON [id number] BY THOMPSON S. CRITICAL RESULT CALLED TO, READ BACK BY AND VERIFIED WITH: L SEAY 02/04/17 @ 27 M VESTAL Performed at [facility], [place] [address], [place], [place] [address]    Culture STREPTOCOCCUS DYSGALACTIAE (A)  Final   Report Status 02/06/2017 FINAL  Final   Organism ID, Bacteria STREPTOCOCCUS DYSGALACTIAE  Final      Susceptibility   Streptococcus dysgalactiae - MIC*    CLINDAMYCIN >=1 RESISTANT Resistant     AMPICILLIN 4      ERYTHROMYCIN >=8 RESISTANT Resistant     VANCOMYCIN 0.5 SENSITIVE Sensitive  CEFTRIAXONE <=0.12 SENSITIVE Sensitive     LEVOFLOXACIN 0.5 SENSITIVE Sensitive     * STREPTOCOCCUS DYSGALACTIAE  Culture, blood (Routine x 2)     Status: None   Collection Time: 02/03/17  8:04 PM  Result Value Ref Range Status   Specimen Description RIGHT ANTECUBITAL  Final   Special Requests   Final    BOTTLES DRAWN AEROBIC AND ANAEROBIC Blood Culture adequate volume   Culture NO GROWTH 5 DAYS  Final   Report Status 02/08/2017 FINAL  Final  Blood Culture ID Panel (Reflexed)     Status: Abnormal   Collection Time: 02/03/17  8:04 PM  Result Value Ref Range Status   Enterococcus species NOT DETECTED NOT DETECTED Final   Vancomycin resistance NOT DETECTED NOT DETECTED Final   Listeria monocytogenes NOT DETECTED NOT DETECTED Final   Staphylococcus species NOT DETECTED NOT DETECTED Final   Staphylococcus aureus NOT DETECTED NOT DETECTED Final   Methicillin resistance NOT DETECTED NOT DETECTED Final   Streptococcus species DETECTED (A) NOT DETECTED Final    Comment: CRITICAL RESULT CALLED TO, READ BACK BY AND VERIFIED WITH: L SEAY 02/04/17 @ 1413 M VESTAL    Streptococcus agalactiae NOT DETECTED NOT DETECTED Final   Streptococcus pneumoniae NOT DETECTED NOT DETECTED Final    Streptococcus pyogenes NOT DETECTED NOT DETECTED Final   Acinetobacter baumannii NOT DETECTED NOT DETECTED Final   Enterobacteriaceae species NOT DETECTED NOT DETECTED Final   Enterobacter cloacae complex NOT DETECTED NOT DETECTED Final   Escherichia coli NOT DETECTED NOT DETECTED Final   Klebsiella oxytoca NOT DETECTED NOT DETECTED Final   Klebsiella pneumoniae NOT DETECTED NOT DETECTED Final   Proteus species NOT DETECTED NOT DETECTED Final   Serratia marcescens NOT DETECTED NOT DETECTED Final   Carbapenem resistance NOT DETECTED NOT DETECTED Final   Haemophilus influenzae NOT DETECTED NOT DETECTED Final   Neisseria meningitidis NOT DETECTED NOT DETECTED Final   Pseudomonas aeruginosa NOT DETECTED NOT DETECTED Final   Candida albicans NOT DETECTED NOT DETECTED Final   Candida glabrata NOT DETECTED NOT DETECTED Final   Candida krusei NOT DETECTED NOT DETECTED Final   Candida parapsilosis NOT DETECTED NOT DETECTED Final   Candida tropicalis NOT DETECTED NOT DETECTED Final    Comment: Performed at [facility], [place]. [address] [place], [place] [address]  Culture, blood (routine x 2)     Status: None (Preliminary result)   Collection Time: 02/05/17  7:39 AM  Result Value Ref Range Status   Specimen Description RIGHT ANTECUBITAL  Final   Special Requests   Final    BOTTLES DRAWN AEROBIC ONLY Blood Culture results may not be optimal due to an inadequate volume of blood received in culture bottles   Culture NO GROWTH 3 DAYS  Final   Report Status PENDING  Incomplete  Culture, blood (routine x 2)     Status: None (Preliminary result)   Collection Time: 02/05/17  7:46 AM  Result Value Ref Range Status   Specimen Description RIGHT ANTECUBITAL  Final   Special Requests   Final    BOTTLES DRAWN AEROBIC AND ANAEROBIC Blood Culture results may not be optimal due to an inadequate volume of blood received in culture bottles   Culture NO GROWTH 3 DAYS  Final   Report Status PENDING   Incomplete  MRSA PCR Screening     Status: None   Collection Time: 02/07/17 12:01 AM  Result Value Ref Range Status   MRSA by PCR NEGATIVE NEGATIVE Final  Comment:        The GeneXpert MRSA Assay (FDA approved for NASAL specimens only), is one component of a comprehensive MRSA colonization surveillance program. It is not intended to diagnose MRSA infection nor to guide or monitor treatment for MRSA infections.          Radiology Studies: US Venous Img Lower Unilateral Left  Result Date: 02/07/2017 CLINICAL DATA:  Left lower extremity pain and edema. History of left lower extremity cellulitis. Former smoker. Patient is currently on anticoagulation. Evaluate for DVT. EXAM: LEFT LOWER EXTREMITY VENOUS DOPPLER ULTRASOUND TECHNIQUE: Gray-scale sonography with graded compression, as well as color Doppler and duplex ultrasound were performed to evaluate the lower extremity deep venous systems from the level of the common femoral vein and including the common femoral, femoral, profunda femoral, popliteal and calf veins including the posterior tibial, peroneal and gastrocnemius veins when visible. The superficial great saphenous vein was also interrogated. Spectral Doppler was utilized to evaluate flow at rest and with distal augmentation maneuvers in the common femoral, femoral and popliteal veins. COMPARISON:  None. FINDINGS: Contralateral Common Femoral Vein: Respiratory phasicity is normal and symmetric with the symptomatic side. No evidence of thrombus. Normal compressibility. Common Femoral Vein: No evidence of thrombus. Normal compressibility, respiratory phasicity and response to augmentation. Saphenofemoral Junction: No evidence of thrombus. Normal compressibility and flow on color Doppler imaging. Profunda Femoral Vein: No evidence of thrombus. Normal compressibility and flow on color Doppler imaging. Femoral Vein: No evidence of thrombus. Normal compressibility, respiratory phasicity and  response to augmentation. Popliteal Vein: No evidence of thrombus. Normal compressibility, respiratory phasicity and response to augmentation. Calf Veins: No evidence of thrombus. Normal compressibility and flow on color Doppler imaging. Superficial Great Saphenous Vein: No evidence of thrombus. Normal compressibility and flow on color Doppler imaging. Venous Reflux:  None. Other Findings: There is a presumably reactive left inguinal lymph node which correlates with the patient's area of redness. The lymph node measures approximately 1.2 cm in greatest short axis diameter (image 11). A moderate amount of subcutaneous edema is noted at the level of the calf and ankle. IMPRESSION: 1. No evidence of DVT within the left lower extremity. 2. Moderate amount of subcutaneous edema at the level of the calf and ankle with associated presumed reactive left inguinal lymph node, supported by clinical history of recent lower extremity cellulitis. Clinical correlation is advised. Electronically Signed   By: Sandi Mariscal M.D.   On: 02/07/2017 12:21        Scheduled Meds: . apixaban  5 mg Oral BID  . ferrous sulfate  325 mg Oral Daily  . metoprolol tartrate  25 mg Oral BID  . multivitamin with minerals  1 tablet Oral Daily  . pantoprazole  40 mg Oral Daily  . polyethylene glycol  17 g Oral Daily  . potassium chloride  40 mEq Oral Q3H  . pravastatin  40 mg Oral Daily  . tamsulosin  0.4 mg Oral QPM   Continuous Infusions: .  ceFAZolin (ANCEF) IV Stopped (02/08/17 1321)     LOS: 5 days    Time spent: 49mins    Raushanah Osmundson, MD [facility] Pager [phone]  If 7PM-7AM, please contact night-coverage [email or web address] Password [facility] 02/08/2017, 6:07 PM

## 2017-02-08 NOTE — Progress Notes (Signed)
Pt left leg elevated with multiple pillows to help promote decrease in swelling to leg. Will continue to monitor.

## 2017-02-09 LAB — CBC
HEMATOCRIT: 38.1 % — AB (ref 39.0–52.0)
Hemoglobin: 13.1 g/dL (ref 13.0–17.0)
MCH: 30.3 pg (ref 26.0–34.0)
MCHC: 34.4 g/dL (ref 30.0–36.0)
MCV: 88.2 fL (ref 78.0–100.0)
Platelets: 189 10*3/uL (ref 150–400)
RBC: 4.32 MIL/uL (ref 4.22–5.81)
RDW: 14.4 % (ref 11.5–15.5)
WBC: 8 10*3/uL (ref 4.0–10.5)

## 2017-02-09 LAB — BASIC METABOLIC PANEL
Anion gap: 9 (ref 5–15)
BUN: 13 mg/dL (ref 6–20)
CALCIUM: 8.4 mg/dL — AB (ref 8.9–10.3)
CHLORIDE: 106 mmol/L (ref 101–111)
CO2: 23 mmol/L (ref 22–32)
CREATININE: 0.78 mg/dL (ref 0.61–1.24)
GFR calc Af Amer: >60 mL/min (ref >60)
GFR calc non Af Amer: >60 mL/min (ref >60)
GLUCOSE: 118 mg/dL — AB (ref 65–99)
Potassium: 3.8 mmol/L (ref 3.5–5.1)
Sodium: 138 mmol/L (ref 135–145)

## 2017-02-09 LAB — MAGNESIUM: Magnesium: 2 mg/dL (ref 1.7–2.4)

## 2017-02-09 MED ORDER — PANTOPRAZOLE SODIUM 40 MG PO TBEC: 40.0000 mg | DELAYED_RELEASE_TABLET | Freq: Every day | ORAL | 0 refills | Status: DC

## 2017-02-09 MED ORDER — CEPHALEXIN 500 MG PO CAPS: 500.0000 mg | ORAL_CAPSULE | Freq: Four times a day (QID) | ORAL | 0 refills | Status: AC

## 2017-02-09 NOTE — Discharge Summary (Signed)
Physician Discharge Summary  Gary Carroll OMV:[id number] DOB: 10/09/1945 DOA: 02/03/2017  PCP: Celene Squibb, MD  Admit date: 02/03/2017 Discharge date: 02/09/2017  Admitted From: Home Disposition:  Home  Recommendations for Outpatient Follow-up:  1. Follow up with PCP in 1-2 weeks 2. Please obtain BMP/CBC in one week 3. Please evaluate cellulitis on follow-up with PCP. If cellulitis persists, consider extending out antibiotic course.  Home Health:Home health RN, PT Equipment/Devices:  Discharge Condition:Stable CODE STATUS:Full code Diet recommendation: Heart Healthy    Brief/Interim Summary: 71 year old male admitted with left lower extremity cellulitis and associated sepsis. He was treated with intravenous antibiotics. Her mental status had initially worsened due to sepsis. This improved with treatment. He had positive cultures and has been treated appropriately with antibiotics.  Discharge Diagnoses:  Principal Problem:   Cellulitis of left lower extremity Active Problems:   Essential hypertension, benign   Mixed hyperlipidemia   GERD (gastroesophageal reflux disease)   AF (paroxysmal atrial fibrillation) (HCC)   Sepsis due to cellulitis (HCC)   BPH (benign prostatic hyperplasia)   Urinary tract infection  1. Cellulitis left lower extremity. Fevers have resolved. Overall erythema significantly improved once he was able to elevate his lower extremity. He has been advised to continue this at home. All walking, it was noted that skin on left lower leg became significantly darkened consistent with stasis.Marland Kitchen Antibiotics have been transitioned to Keflex. Venous dopplers negative for DVT 2. Streptococcal sepsis. One out of 2 blood cultures positive for Streptococcus. Repeat blood cultures have shown no growth. Source is cellulitis. Hemodynamics are currently stable. Lactic acid has trended down. Discussed with Dr. Linus Salmons on call for infectious disease. Recommended changing  antibiotics to Ancef, and changing to by mouth once clinically improved. On discharge, he was transitioned to Keflex 3. Chronic diastolic congestive heart failure. Appears compensated. Continue current treatments. 4. Acute metabolic encephalopathy. Likely related to acute illness. Mental status improved back to baseline with treatment. 5. Chronic atrial fibrillation. Continue on metoprolol and apixaban for anticoagulation 6. Hyperlipidemia. Continue statin  7. hypertension. Blood pressures are stable. Continue current treatments.  Discharge Instructions  Discharge Instructions    Diet - low sodium heart healthy    Complete by:  As directed    Increase activity slowly    Complete by:  As directed      Allergies as of 02/09/2017      Reactions   Diovan [valsartan] Cough   Tagamet [cimetidine] Other (See Comments)   irrittability      Medication List    TAKE these medications   apixaban 5 MG Tabs tablet Commonly known as:  ELIQUIS Take 1 tablet (5 mg total) by mouth 2 (two) times daily.   cephALEXin 500 MG capsule Commonly known as:  KEFLEX Take 1 capsule (500 mg total) by mouth 4 (four) times daily.   diphenhydrAMINE 25 MG tablet Commonly known as:  BENADRYL Take 50 mg by mouth at bedtime as needed for sleep.   ferrous sulfate 325 (65 FE) MG tablet Take 325 mg by mouth daily.   Fish Oil 1000 MG Caps Take 4 capsules by mouth daily.   loratadine 10 MG tablet Commonly known as:  CLARITIN Take 10 mg by mouth daily.   Melatonin 5 MG Tabs Take 5 mg by mouth at bedtime.   metoprolol tartrate 25 MG tablet Commonly known as:  LOPRESSOR Take 1 tablet (25 mg total) by mouth 2 (two) times daily.   MULTIVITAL tablet Take 1 tablet by mouth  daily.   oxybutynin 10 MG 24 hr tablet Commonly known as:  DITROPAN-XL Take 10 mg by mouth daily.   pantoprazole 40 MG tablet Commonly known as:  PROTONIX Take 1 tablet (40 mg total) by mouth daily.   pravastatin 40 MG  tablet Commonly known as:  PRAVACHOL Take 1 tablet (40 mg total) by mouth daily.   ranitidine 150 MG tablet Commonly known as:  ZANTAC Take 150 mg by mouth at bedtime as needed for heartburn.   sodium chloride 0.65 % Soln nasal spray Commonly known as:  OCEAN Place 1 spray into both nostrils as needed for congestion.   tamsulosin 0.4 MG Caps capsule Commonly known as:  FLOMAX Take 0.4 mg by mouth every evening.   vitamin C 500 MG tablet Commonly known as:  ASCORBIC ACID Take 500 mg by mouth daily.            Discharge Care Instructions        Start     Ordered   02/10/17 0000  pantoprazole (PROTONIX) 40 MG tablet  Daily     02/09/17 1638   02/09/17 0000  cephALEXin (KEFLEX) 500 MG capsule  4 times daily     02/09/17 1638   02/09/17 0000  Increase activity slowly     02/09/17 1638   02/09/17 0000  Diet - low sodium heart healthy     02/09/17 1638      Allergies  Allergen Reactions  . Diovan [Valsartan] Cough  . Tagamet [Cimetidine] Other (See Comments)    irrittability    Consultations:     Procedures/Studies: US Venous Img Lower Unilateral Left  Result Date: 02/07/2017 CLINICAL DATA:  Left lower extremity pain and edema. History of left lower extremity cellulitis. Former smoker. Patient is currently on anticoagulation. Evaluate for DVT. EXAM: LEFT LOWER EXTREMITY VENOUS DOPPLER ULTRASOUND TECHNIQUE: Gray-scale sonography with graded compression, as well as color Doppler and duplex ultrasound were performed to evaluate the lower extremity deep venous systems from the level of the common femoral vein and including the common femoral, femoral, profunda femoral, popliteal and calf veins including the posterior tibial, peroneal and gastrocnemius veins when visible. The superficial great saphenous vein was also interrogated. Spectral Doppler was utilized to evaluate flow at rest and with distal augmentation maneuvers in the common femoral, femoral and popliteal  veins. COMPARISON:  None. FINDINGS: Contralateral Common Femoral Vein: Respiratory phasicity is normal and symmetric with the symptomatic side. No evidence of thrombus. Normal compressibility. Common Femoral Vein: No evidence of thrombus. Normal compressibility, respiratory phasicity and response to augmentation. Saphenofemoral Junction: No evidence of thrombus. Normal compressibility and flow on color Doppler imaging. Profunda Femoral Vein: No evidence of thrombus. Normal compressibility and flow on color Doppler imaging. Femoral Vein: No evidence of thrombus. Normal compressibility, respiratory phasicity and response to augmentation. Popliteal Vein: No evidence of thrombus. Normal compressibility, respiratory phasicity and response to augmentation. Calf Veins: No evidence of thrombus. Normal compressibility and flow on color Doppler imaging. Superficial Great Saphenous Vein: No evidence of thrombus. Normal compressibility and flow on color Doppler imaging. Venous Reflux:  None. Other Findings: There is a presumably reactive left inguinal lymph node which correlates with the patient's area of redness. The lymph node measures approximately 1.2 cm in greatest short axis diameter (image 11). A moderate amount of subcutaneous edema is noted at the level of the calf and ankle. IMPRESSION: 1. No evidence of DVT within the left lower extremity. 2. Moderate amount of subcutaneous edema at the  level of the calf and ankle with associated presumed reactive left inguinal lymph node, supported by clinical history of recent lower extremity cellulitis. Clinical correlation is advised. Electronically Signed   By: Sandi Mariscal M.D.   On: 02/07/2017 12:21   Dg Chest Port 1 View  Result Date: 02/04/2017 CLINICAL DATA:  Sepsis. EXAM: PORTABLE CHEST 1 VIEW COMPARISON:  Chest x-ray 02/03/2017. FINDINGS: Mediastinum and hilar structures normal. Cardiomegaly. Bibasilar atelectasis. No pleural effusion or pneumothorax IMPRESSION:  Cardiomegaly. No pulmonary venous congestion . Bibasilar atelectasis. Electronically Signed   By: Marcello Moores  Register   On: 02/04/2017 15:17   Dg Chest Port 1 View  Result Date: 02/03/2017 CLINICAL DATA:  Cough and SOB, Patient complains of fever and chills at home. Patient states temp was 100.6 at home and took some tylenol. Denies pain EXAM: PORTABLE CHEST 1 VIEW COMPARISON:  1/19/7 FINDINGS: Mild enlargement of the cardiopericardial silhouette. No mediastinal or hilar masses. No evidence of adenopathy. Lungs are clear.  No convincing pleural effusion.  No pneumothorax. Skeletal structures are demineralized but grossly intact. IMPRESSION: No acute cardiopulmonary disease. Electronically Signed   By: Lajean Manes M.D.   On: 02/03/2017 20:09       Subjective: Left leg is feeling better. Less painful. Able to ambulate  Discharge Exam: Vitals:   02/09/17 0749 02/09/17 1403  BP:  (!) 144/64  Pulse:  80  Resp:  16  Temp:  98.2 F (36.8 C)  SpO2: 95% 95%   Vitals:   02/09/17 0500 02/09/17 0600 02/09/17 0749 02/09/17 1403  BP:  (!) 136/59  (!) 144/64  Pulse:  75  80  Resp:  16  16  Temp:  98.3 F (36.8 C)  98.2 F (36.8 C)  TempSrc:  Oral    SpO2:  97% 95% 95%  Weight: 117.9 kg (259 lb 14.4 oz)     Height:        General: Pt is alert, awake, not in acute distress Cardiovascular: RRR, S1/S2 +, no rubs, no gallops Respiratory: CTA bilaterally, no wheezing, no rhonchi Abdominal: Soft, NT, ND, bowel sounds + Extremities:Overall erythema and left lower extremity has improved. After patient walks, darkened skin consistent with venous stasis noted in lower extremity    The results of significant diagnostics from this hospitalization (including imaging, microbiology, ancillary and laboratory) are listed below for reference.     Microbiology: Recent Results (from the past 240 hour(s))  Culture, blood (Routine x 2)     Status: Abnormal   Collection Time: 02/03/17  8:04 PM  Result  Value Ref Range Status   Specimen Description BLOOD RIGHT HAND  Final   Special Requests   Final    BOTTLES DRAWN AEROBIC AND ANAEROBIC Blood Culture adequate volume   Culture  Setup Time   Final    GRAM POSITIVE COCCI IN CHAINS Gram Stain Report Called to,Read Back By and Verified With: ELLIS J. AT 0910A ON [id number] BY THOMPSON S. CRITICAL RESULT CALLED TO, READ BACK BY AND VERIFIED WITH: L SEAY 02/04/17 @ 51 M VESTAL Performed at [facility], [place] [address], [place], [place] [address]    Culture STREPTOCOCCUS DYSGALACTIAE (A)  Final   Report Status 02/06/2017 FINAL  Final   Organism ID, Bacteria STREPTOCOCCUS DYSGALACTIAE  Final      Susceptibility   Streptococcus dysgalactiae - MIC*    CLINDAMYCIN >=1 RESISTANT Resistant     AMPICILLIN 4      ERYTHROMYCIN >=8 RESISTANT Resistant  VANCOMYCIN 0.5 SENSITIVE Sensitive     CEFTRIAXONE <=0.12 SENSITIVE Sensitive     LEVOFLOXACIN 0.5 SENSITIVE Sensitive     * STREPTOCOCCUS DYSGALACTIAE  Culture, blood (Routine x 2)     Status: None   Collection Time: 02/03/17  8:04 PM  Result Value Ref Range Status   Specimen Description RIGHT ANTECUBITAL  Final   Special Requests   Final    BOTTLES DRAWN AEROBIC AND ANAEROBIC Blood Culture adequate volume   Culture NO GROWTH 5 DAYS  Final   Report Status 02/08/2017 FINAL  Final  Blood Culture ID Panel (Reflexed)     Status: Abnormal   Collection Time: 02/03/17  8:04 PM  Result Value Ref Range Status   Enterococcus species NOT DETECTED NOT DETECTED Final   Vancomycin resistance NOT DETECTED NOT DETECTED Final   Listeria monocytogenes NOT DETECTED NOT DETECTED Final   Staphylococcus species NOT DETECTED NOT DETECTED Final   Staphylococcus aureus NOT DETECTED NOT DETECTED Final   Methicillin resistance NOT DETECTED NOT DETECTED Final   Streptococcus species DETECTED (A) NOT DETECTED Final    Comment: CRITICAL RESULT CALLED TO, READ BACK BY AND VERIFIED WITH: L SEAY 02/04/17 @ 1413 M  VESTAL    Streptococcus agalactiae NOT DETECTED NOT DETECTED Final   Streptococcus pneumoniae NOT DETECTED NOT DETECTED Final   Streptococcus pyogenes NOT DETECTED NOT DETECTED Final   Acinetobacter baumannii NOT DETECTED NOT DETECTED Final   Enterobacteriaceae species NOT DETECTED NOT DETECTED Final   Enterobacter cloacae complex NOT DETECTED NOT DETECTED Final   Escherichia coli NOT DETECTED NOT DETECTED Final   Klebsiella oxytoca NOT DETECTED NOT DETECTED Final   Klebsiella pneumoniae NOT DETECTED NOT DETECTED Final   Proteus species NOT DETECTED NOT DETECTED Final   Serratia marcescens NOT DETECTED NOT DETECTED Final   Carbapenem resistance NOT DETECTED NOT DETECTED Final   Haemophilus influenzae NOT DETECTED NOT DETECTED Final   Neisseria meningitidis NOT DETECTED NOT DETECTED Final   Pseudomonas aeruginosa NOT DETECTED NOT DETECTED Final   Candida albicans NOT DETECTED NOT DETECTED Final   Candida glabrata NOT DETECTED NOT DETECTED Final   Candida krusei NOT DETECTED NOT DETECTED Final   Candida parapsilosis NOT DETECTED NOT DETECTED Final   Candida tropicalis NOT DETECTED NOT DETECTED Final    Comment: Performed at [facility], [place] [address], [place], [place] [address]  Culture, blood (routine x 2)     Status: None (Preliminary result)   Collection Time: 02/05/17  7:39 AM  Result Value Ref Range Status   Specimen Description RIGHT ANTECUBITAL  Final   Special Requests   Final    BOTTLES DRAWN AEROBIC ONLY Blood Culture results may not be optimal due to an inadequate volume of blood received in culture bottles   Culture NO GROWTH 4 DAYS  Final   Report Status PENDING  Incomplete  Culture, blood (routine x 2)     Status: None (Preliminary result)   Collection Time: 02/05/17  7:46 AM  Result Value Ref Range Status   Specimen Description RIGHT ANTECUBITAL  Final   Special Requests   Final    BOTTLES DRAWN AEROBIC AND ANAEROBIC Blood Culture results may not be optimal  due to an inadequate volume of blood received in culture bottles   Culture NO GROWTH 4 DAYS  Final   Report Status PENDING  Incomplete  MRSA PCR Screening     Status: None   Collection Time: 02/07/17 12:01 AM  Result Value Ref Range Status  MRSA by PCR NEGATIVE NEGATIVE Final    Comment:        The GeneXpert MRSA Assay (FDA approved for NASAL specimens only), is one component of a comprehensive MRSA colonization surveillance program. It is not intended to diagnose MRSA infection nor to guide or monitor treatment for MRSA infections.      Labs: BNP (last 3 results)  Recent Labs  02/03/17 2004  BNP 09.3   Basic Metabolic Panel:  Recent Labs Lab 02/03/17 2004  02/05/17 0354 02/06/17 0553 02/07/17 0523 02/08/17 0629 02/09/17 0656  NA  --   < > 133* 133* 133* 134* 138  K  --   < > 3.8 3.7 3.4* 3.1* 3.8  CL  --   < > 104 103 104 104 106  CO2  --   < > 22 23 23 24 23   GLUCOSE  --   < > 120* 123* 111* 156* 118*  BUN  --   < > 15 16 13 13 13   CREATININE  --   < > 0.87 0.73 0.75 0.76 0.78  CALCIUM  --   < > 8.3* 8.1* 8.0* 7.8* 8.4*  MG 1.6*  --  1.9  --   --   --  2.0  < > = values in this interval not displayed. Liver Function Tests:  Recent Labs Lab 02/03/17 1919  AST 34  ALT 39  ALKPHOS 72  BILITOT 0.8  PROT 7.4  ALBUMIN 4.1   No results for input(s): LIPASE, AMYLASE in the last 168 hours. No results for input(s): AMMONIA in the last 168 hours. CBC:  Recent Labs Lab 02/03/17 1919 02/04/17 0348 02/05/17 0354 02/06/17 0553 02/07/17 0523 02/08/17 0629 02/09/17 0656  WBC 11.7* 16.6* 18.3* 11.5* 11.5* 11.8* 8.0  NEUTROABS 10.4* 15.4* 17.2* 10.3*  --   --   --   HGB 15.5 14.3 13.7 13.9 13.2 12.4* 13.1  HCT 46.3 42.1 41.1 40.7 38.3* 36.8* 38.1*  MCV 89.7 90.1 89.5 88.3 87.0 88.0 88.2  PLT 175 147* 131* 117* 147* 160 189   Cardiac Enzymes:  Recent Labs Lab 02/03/17 2004  TROPONINI <0.03   BNP: Invalid input(s): POCBNP CBG: No results for  input(s): GLUCAP in the last 168 hours. D-Dimer No results for input(s): DDIMER in the last 72 hours. Hgb A1c No results for input(s): HGBA1C in the last 72 hours. Lipid Profile No results for input(s): CHOL, HDL, LDLCALC, TRIG, CHOLHDL, LDLDIRECT in the last 72 hours. Thyroid function studies No results for input(s): TSH, T4TOTAL, T3FREE, THYROIDAB in the last 72 hours.  Invalid input(s): FREET3 Anemia work up No results for input(s): VITAMINB12, FOLATE, FERRITIN, TIBC, IRON, RETICCTPCT in the last 72 hours. Urinalysis    Component Value Date/Time   COLORURINE YELLOW 02/03/2017 1845   APPEARANCEUR HAZY (A) 02/03/2017 1845   LABSPEC 1.017 02/03/2017 1845   PHURINE 5.0 02/03/2017 1845   GLUCOSEU NEGATIVE 02/03/2017 1845   HGBUR NEGATIVE 02/03/2017 1845   BILIRUBINUR NEGATIVE 02/03/2017 1845   BILIRUBINUR neg 08/31/2012 1203   KETONESUR NEGATIVE 02/03/2017 1845   PROTEINUR NEGATIVE 02/03/2017 1845   UROBILINOGEN negative 08/31/2012 1203   UROBILINOGEN 0.2 05/31/2010 1125   NITRITE NEGATIVE 02/03/2017 1845   LEUKOCYTESUR MODERATE (A) 02/03/2017 1845   Sepsis Labs Invalid input(s): PROCALCITONIN,  WBC,  LACTICIDVEN Microbiology Recent Results (from the past 240 hour(s))  Culture, blood (Routine x 2)     Status: Abnormal   Collection Time: 02/03/17  8:04 PM  Result Value Ref  Range Status   Specimen Description BLOOD RIGHT HAND  Final   Special Requests   Final    BOTTLES DRAWN AEROBIC AND ANAEROBIC Blood Culture adequate volume   Culture  Setup Time   Final    GRAM POSITIVE COCCI IN CHAINS Gram Stain Report Called to,Read Back By and Verified With: ELLIS J. AT 0910A ON [id number] BY THOMPSON S. CRITICAL RESULT CALLED TO, READ BACK BY AND VERIFIED WITH: L SEAY 02/04/17 @ 42 M VESTAL Performed at [facility], [place] [address], [place], [place] [address]    Culture STREPTOCOCCUS DYSGALACTIAE (A)  Final   Report Status 02/06/2017 FINAL  Final   Organism ID, Bacteria  STREPTOCOCCUS DYSGALACTIAE  Final      Susceptibility   Streptococcus dysgalactiae - MIC*    CLINDAMYCIN >=1 RESISTANT Resistant     AMPICILLIN 4      ERYTHROMYCIN >=8 RESISTANT Resistant     VANCOMYCIN 0.5 SENSITIVE Sensitive     CEFTRIAXONE <=0.12 SENSITIVE Sensitive     LEVOFLOXACIN 0.5 SENSITIVE Sensitive     * STREPTOCOCCUS DYSGALACTIAE  Culture, blood (Routine x 2)     Status: None   Collection Time: 02/03/17  8:04 PM  Result Value Ref Range Status   Specimen Description RIGHT ANTECUBITAL  Final   Special Requests   Final    BOTTLES DRAWN AEROBIC AND ANAEROBIC Blood Culture adequate volume   Culture NO GROWTH 5 DAYS  Final   Report Status 02/08/2017 FINAL  Final  Blood Culture ID Panel (Reflexed)     Status: Abnormal   Collection Time: 02/03/17  8:04 PM  Result Value Ref Range Status   Enterococcus species NOT DETECTED NOT DETECTED Final   Vancomycin resistance NOT DETECTED NOT DETECTED Final   Listeria monocytogenes NOT DETECTED NOT DETECTED Final   Staphylococcus species NOT DETECTED NOT DETECTED Final   Staphylococcus aureus NOT DETECTED NOT DETECTED Final   Methicillin resistance NOT DETECTED NOT DETECTED Final   Streptococcus species DETECTED (A) NOT DETECTED Final    Comment: CRITICAL RESULT CALLED TO, READ BACK BY AND VERIFIED WITH: L SEAY 02/04/17 @ 1413 M VESTAL    Streptococcus agalactiae NOT DETECTED NOT DETECTED Final   Streptococcus pneumoniae NOT DETECTED NOT DETECTED Final   Streptococcus pyogenes NOT DETECTED NOT DETECTED Final   Acinetobacter baumannii NOT DETECTED NOT DETECTED Final   Enterobacteriaceae species NOT DETECTED NOT DETECTED Final   Enterobacter cloacae complex NOT DETECTED NOT DETECTED Final   Escherichia coli NOT DETECTED NOT DETECTED Final   Klebsiella oxytoca NOT DETECTED NOT DETECTED Final   Klebsiella pneumoniae NOT DETECTED NOT DETECTED Final   Proteus species NOT DETECTED NOT DETECTED Final   Serratia marcescens NOT DETECTED NOT  DETECTED Final   Carbapenem resistance NOT DETECTED NOT DETECTED Final   Haemophilus influenzae NOT DETECTED NOT DETECTED Final   Neisseria meningitidis NOT DETECTED NOT DETECTED Final   Pseudomonas aeruginosa NOT DETECTED NOT DETECTED Final   Candida albicans NOT DETECTED NOT DETECTED Final   Candida glabrata NOT DETECTED NOT DETECTED Final   Candida krusei NOT DETECTED NOT DETECTED Final   Candida parapsilosis NOT DETECTED NOT DETECTED Final   Candida tropicalis NOT DETECTED NOT DETECTED Final    Comment: Performed at [facility], [place] [address], [place], [place] [address]  Culture, blood (routine x 2)     Status: None (Preliminary result)   Collection Time: 02/05/17  7:39 AM  Result Value Ref Range Status   Specimen Description  RIGHT ANTECUBITAL  Final   Special Requests   Final    BOTTLES DRAWN AEROBIC ONLY Blood Culture results may not be optimal due to an inadequate volume of blood received in culture bottles   Culture NO GROWTH 4 DAYS  Final   Report Status PENDING  Incomplete  Culture, blood (routine x 2)     Status: None (Preliminary result)   Collection Time: 02/05/17  7:46 AM  Result Value Ref Range Status   Specimen Description RIGHT ANTECUBITAL  Final   Special Requests   Final    BOTTLES DRAWN AEROBIC AND ANAEROBIC Blood Culture results may not be optimal due to an inadequate volume of blood received in culture bottles   Culture NO GROWTH 4 DAYS  Final   Report Status PENDING  Incomplete  MRSA PCR Screening     Status: None   Collection Time: 02/07/17 12:01 AM  Result Value Ref Range Status   MRSA by PCR NEGATIVE NEGATIVE Final    Comment:        The GeneXpert MRSA Assay (FDA approved for NASAL specimens only), is one component of a comprehensive MRSA colonization surveillance program. It is not intended to diagnose MRSA infection nor to guide or monitor treatment for MRSA infections.      Time coordinating discharge: Over 30  minutes  SIGNED:   Kathie Dike, MD  [facility] 02/09/2017, 6:57 PM Pager   If 7PM-7AM, please contact night-coverage [email or web address] Password [email or web address]

## 2017-02-09 NOTE — Progress Notes (Signed)
)  Patient stated he did not wish to sleep on CPAP tonight. CPAP set up earlier and ready if needed.

## 2017-02-09 NOTE — Progress Notes (Signed)
IV removed, WNL. D/C instructions given to pt. Verbalized understanding. Wheeled pt down to ED parking lot to his own car.

## 2017-02-09 NOTE — Progress Notes (Signed)
Pt ambulated twice in hallway, and several times in room going to bathroom. Tolerates fair, takes a while to get set up and moving, yet he is steady for the most part. MD made aware.

## 2017-02-10 LAB — CULTURE, BLOOD (ROUTINE X 2)
CULTURE: NO GROWTH
Culture: NO GROWTH

## 2017-02-12 DIAGNOSIS — T1490XA Injury, unspecified, initial encounter: Secondary | ICD-10-CM | POA: Diagnosis not present

## 2017-02-13 DIAGNOSIS — Z6835 Body mass index (BMI) 35.0-35.9, adult: Secondary | ICD-10-CM | POA: Diagnosis not present

## 2017-02-13 DIAGNOSIS — L0390 Cellulitis, unspecified: Secondary | ICD-10-CM | POA: Diagnosis not present

## 2017-02-13 DIAGNOSIS — Z712 Person consulting for explanation of examination or test findings: Secondary | ICD-10-CM | POA: Diagnosis not present

## 2017-02-18 DIAGNOSIS — R944 Abnormal results of kidney function studies: Secondary | ICD-10-CM | POA: Diagnosis not present

## 2017-02-21 DIAGNOSIS — Z6834 Body mass index (BMI) 34.0-34.9, adult: Secondary | ICD-10-CM | POA: Diagnosis not present

## 2017-02-21 DIAGNOSIS — Z23 Encounter for immunization: Secondary | ICD-10-CM | POA: Diagnosis not present

## 2017-02-21 DIAGNOSIS — L0390 Cellulitis, unspecified: Secondary | ICD-10-CM | POA: Diagnosis not present

## 2017-02-21 DIAGNOSIS — R600 Localized edema: Secondary | ICD-10-CM | POA: Diagnosis not present

## 2017-03-07 DIAGNOSIS — L03116 Cellulitis of left lower limb: Secondary | ICD-10-CM | POA: Diagnosis not present

## 2017-03-14 DIAGNOSIS — R609 Edema, unspecified: Secondary | ICD-10-CM | POA: Diagnosis not present

## 2017-03-14 DIAGNOSIS — L0390 Cellulitis, unspecified: Secondary | ICD-10-CM | POA: Diagnosis not present

## 2017-03-14 DIAGNOSIS — Z6837 Body mass index (BMI) 37.0-37.9, adult: Secondary | ICD-10-CM | POA: Diagnosis not present

## 2017-04-30 DIAGNOSIS — J3089 Other allergic rhinitis: Secondary | ICD-10-CM | POA: Diagnosis not present

## 2017-04-30 DIAGNOSIS — G4730 Sleep apnea, unspecified: Secondary | ICD-10-CM | POA: Diagnosis not present

## 2017-04-30 DIAGNOSIS — J019 Acute sinusitis, unspecified: Secondary | ICD-10-CM | POA: Diagnosis not present

## 2017-05-22 DIAGNOSIS — G9009 Other idiopathic peripheral autonomic neuropathy: Secondary | ICD-10-CM | POA: Diagnosis not present

## 2017-05-22 DIAGNOSIS — G4730 Sleep apnea, unspecified: Secondary | ICD-10-CM | POA: Diagnosis not present

## 2017-05-22 DIAGNOSIS — R944 Abnormal results of kidney function studies: Secondary | ICD-10-CM | POA: Diagnosis not present

## 2017-05-22 DIAGNOSIS — D509 Iron deficiency anemia, unspecified: Secondary | ICD-10-CM | POA: Diagnosis not present

## 2017-05-22 DIAGNOSIS — I872 Venous insufficiency (chronic) (peripheral): Secondary | ICD-10-CM | POA: Diagnosis not present

## 2017-05-22 DIAGNOSIS — M1711 Unilateral primary osteoarthritis, right knee: Secondary | ICD-10-CM | POA: Diagnosis not present

## 2017-05-22 DIAGNOSIS — I482 Chronic atrial fibrillation: Secondary | ICD-10-CM | POA: Diagnosis not present

## 2017-05-22 DIAGNOSIS — J3089 Other allergic rhinitis: Secondary | ICD-10-CM | POA: Diagnosis not present

## 2017-05-22 DIAGNOSIS — R7301 Impaired fasting glucose: Secondary | ICD-10-CM | POA: Diagnosis not present

## 2017-05-22 DIAGNOSIS — J019 Acute sinusitis, unspecified: Secondary | ICD-10-CM | POA: Diagnosis not present

## 2017-05-26 DIAGNOSIS — D509 Iron deficiency anemia, unspecified: Secondary | ICD-10-CM | POA: Diagnosis not present

## 2017-05-26 DIAGNOSIS — R944 Abnormal results of kidney function studies: Secondary | ICD-10-CM | POA: Diagnosis not present

## 2017-05-26 DIAGNOSIS — I10 Essential (primary) hypertension: Secondary | ICD-10-CM | POA: Diagnosis not present

## 2017-05-26 DIAGNOSIS — Z0001 Encounter for general adult medical examination with abnormal findings: Secondary | ICD-10-CM | POA: Diagnosis not present

## 2017-05-26 DIAGNOSIS — Z6837 Body mass index (BMI) 37.0-37.9, adult: Secondary | ICD-10-CM | POA: Diagnosis not present

## 2017-05-26 DIAGNOSIS — R339 Retention of urine, unspecified: Secondary | ICD-10-CM | POA: Diagnosis not present

## 2017-05-26 DIAGNOSIS — I482 Chronic atrial fibrillation: Secondary | ICD-10-CM | POA: Diagnosis not present

## 2017-05-26 DIAGNOSIS — R252 Cramp and spasm: Secondary | ICD-10-CM | POA: Diagnosis not present

## 2017-05-26 DIAGNOSIS — Z9181 History of falling: Secondary | ICD-10-CM | POA: Diagnosis not present

## 2017-05-26 DIAGNOSIS — E119 Type 2 diabetes mellitus without complications: Secondary | ICD-10-CM | POA: Diagnosis not present

## 2017-06-26 DIAGNOSIS — L03039 Cellulitis of unspecified toe: Secondary | ICD-10-CM | POA: Diagnosis not present

## 2017-06-26 DIAGNOSIS — Z6837 Body mass index (BMI) 37.0-37.9, adult: Secondary | ICD-10-CM | POA: Diagnosis not present

## 2017-07-14 ENCOUNTER — Encounter: Payer: Self-pay | Admitting: Podiatry

## 2017-07-14 ENCOUNTER — Ambulatory Visit (INDEPENDENT_AMBULATORY_CARE_PROVIDER_SITE_OTHER): Payer: Medicare HMO | Admitting: Podiatry

## 2017-07-14 ENCOUNTER — Ambulatory Visit: Payer: No Typology Code available for payment source

## 2017-07-14 VITALS — BP 155/74 | HR 68

## 2017-07-14 DIAGNOSIS — R52 Pain, unspecified: Secondary | ICD-10-CM

## 2017-07-14 DIAGNOSIS — M7752 Other enthesopathy of left foot: Secondary | ICD-10-CM

## 2017-07-14 NOTE — Progress Notes (Signed)
   Subjective:    Patient ID: Gary Carroll, male    DOB: 07/03/1945, 72 y.o.   MRN: [id number]  HPIthis patient presents the office with chief complaint of pain in the bottom of his left foot.  He says that his left foot has been painful for the last 2 months.    He points to an area on the outside of his left foot.  This area is halfway between the fifth metatarsal base and the back of the left heel.  He states that he was seen initially at the [place] and x-rays were taken and he was diagnosed with severe rearfoot arthritis.  He says he was treated with inserts for his painful left foot.  Patient says the pain continues.   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  Patient is taking eliquiss.  He presents the office today for an evaluation and treatment of his painful left foot     Review of Systems  All other systems reviewed and are negative.      Objective:   Physical Exam General Appearance  Alert, conversant and in no acute stress.  Vascular  Dorsalis pedis  are palpable  left foot.  Posterior tibial pulses are nonpalpable due to the severity of swelling left ankle   Capillary return is within normal limits  bilaterally. Temperature is within normal limits  Bilaterally.  Neurologic  Senn-Weinstein monofilament wire test within normal limits  bilaterally. Muscle power within normal limits bilaterally.  Nails normal nails noted with no evidence of bacterial or fungalinfection  Orthopedic  No limitations of motion of motion feet bilaterally.  No crepitus or effusions noted.  Pain noted upon ROM STJ left foot.  Swelling noted rearfoot.   Palpable pain along the lateral aspect left foot behind the fifth metabase plantarly.  Skin  normotropic skin with no porokeratosis noted bilaterally.  No signs of infections or ulcers noted.          Assessment & Plan:  Bursitis left foot due to gait due to arthritis left foot.IE     Initial exam.  X-rays reveal significant  arthritis noted to the left rear foot. Plantarflexed talus is noted.  Clinically patient ha localized pain proxima meta-baseand he was treated with injection therapy. Injection therapy using 1.0 cc. Of 2% xylocaine( 20 mg.) plus 1 cc. of kenalog-la ( 10 mg) plus 1/2 cc. of dexamethazone phosphate ( 2 mg).  RTC prn    Gardiner Barefoot DPM

## 2017-08-20 DIAGNOSIS — G4730 Sleep apnea, unspecified: Secondary | ICD-10-CM | POA: Diagnosis not present

## 2017-08-20 DIAGNOSIS — R7301 Impaired fasting glucose: Secondary | ICD-10-CM | POA: Diagnosis not present

## 2017-08-20 DIAGNOSIS — G9009 Other idiopathic peripheral autonomic neuropathy: Secondary | ICD-10-CM | POA: Diagnosis not present

## 2017-08-20 DIAGNOSIS — J019 Acute sinusitis, unspecified: Secondary | ICD-10-CM | POA: Diagnosis not present

## 2017-08-20 DIAGNOSIS — I482 Chronic atrial fibrillation: Secondary | ICD-10-CM | POA: Diagnosis not present

## 2017-08-20 DIAGNOSIS — E119 Type 2 diabetes mellitus without complications: Secondary | ICD-10-CM | POA: Diagnosis not present

## 2017-08-20 DIAGNOSIS — L03039 Cellulitis of unspecified toe: Secondary | ICD-10-CM | POA: Diagnosis not present

## 2017-08-20 DIAGNOSIS — R339 Retention of urine, unspecified: Secondary | ICD-10-CM | POA: Diagnosis not present

## 2017-08-20 DIAGNOSIS — I872 Venous insufficiency (chronic) (peripheral): Secondary | ICD-10-CM | POA: Diagnosis not present

## 2017-08-20 DIAGNOSIS — J3089 Other allergic rhinitis: Secondary | ICD-10-CM | POA: Diagnosis not present

## 2017-08-29 DIAGNOSIS — I482 Chronic atrial fibrillation: Secondary | ICD-10-CM | POA: Diagnosis not present

## 2017-08-29 DIAGNOSIS — E119 Type 2 diabetes mellitus without complications: Secondary | ICD-10-CM | POA: Diagnosis not present

## 2017-08-29 DIAGNOSIS — I10 Essential (primary) hypertension: Secondary | ICD-10-CM | POA: Diagnosis not present

## 2017-08-29 DIAGNOSIS — E782 Mixed hyperlipidemia: Secondary | ICD-10-CM | POA: Diagnosis not present

## 2017-08-29 DIAGNOSIS — M810 Age-related osteoporosis without current pathological fracture: Secondary | ICD-10-CM | POA: Diagnosis not present

## 2017-08-29 DIAGNOSIS — Z6836 Body mass index (BMI) 36.0-36.9, adult: Secondary | ICD-10-CM | POA: Diagnosis not present

## 2017-08-29 DIAGNOSIS — R339 Retention of urine, unspecified: Secondary | ICD-10-CM | POA: Diagnosis not present

## 2017-08-29 DIAGNOSIS — D509 Iron deficiency anemia, unspecified: Secondary | ICD-10-CM | POA: Diagnosis not present

## 2017-08-29 DIAGNOSIS — R944 Abnormal results of kidney function studies: Secondary | ICD-10-CM | POA: Diagnosis not present

## 2017-09-29 DIAGNOSIS — Z809 Family history of malignant neoplasm, unspecified: Secondary | ICD-10-CM | POA: Diagnosis not present

## 2017-09-29 DIAGNOSIS — E785 Hyperlipidemia, unspecified: Secondary | ICD-10-CM | POA: Diagnosis not present

## 2017-09-29 DIAGNOSIS — Z6833 Body mass index (BMI) 33.0-33.9, adult: Secondary | ICD-10-CM | POA: Diagnosis not present

## 2017-09-29 DIAGNOSIS — I10 Essential (primary) hypertension: Secondary | ICD-10-CM | POA: Diagnosis not present

## 2017-09-29 DIAGNOSIS — N3281 Overactive bladder: Secondary | ICD-10-CM | POA: Diagnosis not present

## 2017-09-29 DIAGNOSIS — N529 Male erectile dysfunction, unspecified: Secondary | ICD-10-CM | POA: Diagnosis not present

## 2017-09-29 DIAGNOSIS — Z7901 Long term (current) use of anticoagulants: Secondary | ICD-10-CM | POA: Diagnosis not present

## 2017-09-29 DIAGNOSIS — E669 Obesity, unspecified: Secondary | ICD-10-CM | POA: Diagnosis not present

## 2017-09-29 DIAGNOSIS — I4891 Unspecified atrial fibrillation: Secondary | ICD-10-CM | POA: Diagnosis not present

## 2017-09-29 DIAGNOSIS — G4730 Sleep apnea, unspecified: Secondary | ICD-10-CM | POA: Diagnosis not present

## 2017-09-30 ENCOUNTER — Emergency Department (HOSPITAL_COMMUNITY): Payer: Medicare HMO

## 2017-09-30 ENCOUNTER — Other Ambulatory Visit: Payer: Self-pay

## 2017-09-30 ENCOUNTER — Inpatient Hospital Stay (HOSPITAL_COMMUNITY)
Admission: EM | Admit: 2017-09-30 | Discharge: 2017-10-09 | DRG: 871 | Disposition: A | Discharge status: Home / Self Care | Payer: Medicare HMO | Attending: Internal Medicine | Admitting: Internal Medicine

## 2017-09-30 ENCOUNTER — Encounter (HOSPITAL_COMMUNITY): Payer: Self-pay | Admitting: Emergency Medicine

## 2017-09-30 DIAGNOSIS — Z96651 Presence of right artificial knee joint: Secondary | ICD-10-CM | POA: Diagnosis present

## 2017-09-30 DIAGNOSIS — J811 Chronic pulmonary edema: Secondary | ICD-10-CM

## 2017-09-30 DIAGNOSIS — Z79899 Other long term (current) drug therapy: Secondary | ICD-10-CM

## 2017-09-30 DIAGNOSIS — I10 Essential (primary) hypertension: Secondary | ICD-10-CM | POA: Diagnosis not present

## 2017-09-30 DIAGNOSIS — L03116 Cellulitis of left lower limb: Secondary | ICD-10-CM

## 2017-09-30 DIAGNOSIS — R740 Nonspecific elevation of levels of transaminase and lactic acid dehydrogenase [LDH]: Secondary | ICD-10-CM

## 2017-09-30 DIAGNOSIS — J45909 Unspecified asthma, uncomplicated: Secondary | ICD-10-CM | POA: Diagnosis present

## 2017-09-30 DIAGNOSIS — K219 Gastro-esophageal reflux disease without esophagitis: Secondary | ICD-10-CM | POA: Diagnosis present

## 2017-09-30 DIAGNOSIS — E785 Hyperlipidemia, unspecified: Secondary | ICD-10-CM | POA: Diagnosis present

## 2017-09-30 DIAGNOSIS — I5031 Acute diastolic (congestive) heart failure: Secondary | ICD-10-CM | POA: Diagnosis present

## 2017-09-30 DIAGNOSIS — N390 Urinary tract infection, site not specified: Secondary | ICD-10-CM | POA: Diagnosis not present

## 2017-09-30 DIAGNOSIS — R6520 Severe sepsis without septic shock: Secondary | ICD-10-CM | POA: Diagnosis not present

## 2017-09-30 DIAGNOSIS — L0390 Cellulitis, unspecified: Secondary | ICD-10-CM | POA: Diagnosis not present

## 2017-09-30 DIAGNOSIS — A419 Sepsis, unspecified organism: Principal | ICD-10-CM

## 2017-09-30 DIAGNOSIS — I110 Hypertensive heart disease with heart failure: Secondary | ICD-10-CM | POA: Diagnosis present

## 2017-09-30 DIAGNOSIS — R7989 Other specified abnormal findings of blood chemistry: Secondary | ICD-10-CM | POA: Diagnosis not present

## 2017-09-30 DIAGNOSIS — G4730 Sleep apnea, unspecified: Secondary | ICD-10-CM

## 2017-09-30 DIAGNOSIS — B961 Klebsiella pneumoniae [K. pneumoniae] as the cause of diseases classified elsewhere: Secondary | ICD-10-CM | POA: Diagnosis present

## 2017-09-30 DIAGNOSIS — Z888 Allergy status to other drugs, medicaments and biological substances status: Secondary | ICD-10-CM

## 2017-09-30 DIAGNOSIS — R000 Tachycardia, unspecified: Secondary | ICD-10-CM | POA: Diagnosis not present

## 2017-09-30 DIAGNOSIS — G4700 Insomnia, unspecified: Secondary | ICD-10-CM | POA: Diagnosis not present

## 2017-09-30 DIAGNOSIS — R7401 Elevation of levels of liver transaminase levels: Secondary | ICD-10-CM

## 2017-09-30 DIAGNOSIS — R1310 Dysphagia, unspecified: Secondary | ICD-10-CM | POA: Diagnosis present

## 2017-09-30 DIAGNOSIS — M79605 Pain in left leg: Secondary | ICD-10-CM

## 2017-09-30 DIAGNOSIS — N400 Enlarged prostate without lower urinary tract symptoms: Secondary | ICD-10-CM | POA: Diagnosis not present

## 2017-09-30 DIAGNOSIS — N138 Other obstructive and reflux uropathy: Secondary | ICD-10-CM | POA: Diagnosis present

## 2017-09-30 DIAGNOSIS — Z87891 Personal history of nicotine dependence: Secondary | ICD-10-CM

## 2017-09-30 DIAGNOSIS — N401 Enlarged prostate with lower urinary tract symptoms: Secondary | ICD-10-CM | POA: Diagnosis present

## 2017-09-30 DIAGNOSIS — E782 Mixed hyperlipidemia: Secondary | ICD-10-CM | POA: Diagnosis present

## 2017-09-30 DIAGNOSIS — E876 Hypokalemia: Secondary | ICD-10-CM | POA: Diagnosis not present

## 2017-09-30 DIAGNOSIS — D696 Thrombocytopenia, unspecified: Secondary | ICD-10-CM | POA: Diagnosis present

## 2017-09-30 DIAGNOSIS — M170 Bilateral primary osteoarthritis of knee: Secondary | ICD-10-CM | POA: Diagnosis present

## 2017-09-30 DIAGNOSIS — Z7901 Long term (current) use of anticoagulants: Secondary | ICD-10-CM

## 2017-09-30 DIAGNOSIS — R0602 Shortness of breath: Secondary | ICD-10-CM

## 2017-09-30 DIAGNOSIS — I480 Paroxysmal atrial fibrillation: Secondary | ICD-10-CM | POA: Diagnosis not present

## 2017-09-30 DIAGNOSIS — G4733 Obstructive sleep apnea (adult) (pediatric): Secondary | ICD-10-CM

## 2017-09-30 DIAGNOSIS — R3914 Feeling of incomplete bladder emptying: Secondary | ICD-10-CM | POA: Diagnosis not present

## 2017-09-30 DIAGNOSIS — I5030 Unspecified diastolic (congestive) heart failure: Secondary | ICD-10-CM | POA: Diagnosis not present

## 2017-09-30 DIAGNOSIS — R609 Edema, unspecified: Secondary | ICD-10-CM

## 2017-09-30 HISTORY — DX: Sleep apnea, unspecified: G47.30

## 2017-09-30 LAB — COMPREHENSIVE METABOLIC PANEL
ALT: 37 U/L (ref 17–63)
ANION GAP: 10 (ref 5–15)
AST: 30 U/L (ref 15–41)
Albumin: 4 g/dL (ref 3.5–5.0)
Alkaline Phosphatase: 56 U/L (ref 38–126)
BUN: 31 mg/dL — AB (ref 6–20)
CALCIUM: 9.7 mg/dL (ref 8.9–10.3)
CO2: 26 mmol/L (ref 22–32)
Chloride: 103 mmol/L (ref 101–111)
Creatinine, Ser: 1.08 mg/dL (ref 0.61–1.24)
GFR calc non Af Amer: >60 mL/min (ref >60)
Glucose, Bld: 106 mg/dL — ABNORMAL HIGH (ref 65–99)
POTASSIUM: 4.1 mmol/L (ref 3.5–5.1)
Sodium: 139 mmol/L (ref 135–145)
TOTAL PROTEIN: 7.3 g/dL (ref 6.5–8.1)
Total Bilirubin: 0.7 mg/dL (ref 0.3–1.2)

## 2017-09-30 LAB — CBC WITH DIFFERENTIAL/PLATELET
Basophils Absolute: 0 10*3/uL (ref 0.0–0.1)
Basophils Relative: 0 %
Eosinophils Absolute: 0 10*3/uL (ref 0.0–0.7)
Eosinophils Relative: 0 %
HEMATOCRIT: 47.5 % (ref 39.0–52.0)
Hemoglobin: 15.5 g/dL (ref 13.0–17.0)
LYMPHS PCT: 6 %
Lymphs Abs: 0.5 10*3/uL — ABNORMAL LOW (ref 0.7–4.0)
MCH: 29.6 pg (ref 26.0–34.0)
MCHC: 32.6 g/dL (ref 30.0–36.0)
MCV: 90.8 fL (ref 78.0–100.0)
MONO ABS: 0.2 10*3/uL (ref 0.1–1.0)
MONOS PCT: 2 %
NEUTROS ABS: 7.9 10*3/uL — AB (ref 1.7–7.7)
Neutrophils Relative %: 92 %
Platelets: 165 10*3/uL (ref 150–400)
RBC: 5.23 MIL/uL (ref 4.22–5.81)
RDW: 14.3 % (ref 11.5–15.5)
WBC: 8.5 10*3/uL (ref 4.0–10.5)

## 2017-09-30 LAB — TROPONIN I

## 2017-09-30 MED ORDER — SODIUM CHLORIDE 0.9 % IV SOLN
1000.0000 mL | INTRAVENOUS | Status: DC
  Administered 2017-10-01 – 2017-10-02 (×4): 1000 mL via INTRAVENOUS

## 2017-09-30 MED ORDER — SODIUM CHLORIDE 0.9 % IV BOLUS (SEPSIS)
1000.0000 mL | Freq: Once | INTRAVENOUS | Status: AC
  Administered 2017-10-01: 1000 mL via INTRAVENOUS

## 2017-09-30 NOTE — ED Triage Notes (Signed)
Pt c/o fever and weakness x one hour.

## 2017-10-01 ENCOUNTER — Encounter (HOSPITAL_COMMUNITY): Payer: Self-pay | Admitting: Internal Medicine

## 2017-10-01 DIAGNOSIS — D696 Thrombocytopenia, unspecified: Secondary | ICD-10-CM | POA: Diagnosis present

## 2017-10-01 DIAGNOSIS — Z87891 Personal history of nicotine dependence: Secondary | ICD-10-CM | POA: Diagnosis not present

## 2017-10-01 DIAGNOSIS — R3914 Feeling of incomplete bladder emptying: Secondary | ICD-10-CM | POA: Diagnosis not present

## 2017-10-01 DIAGNOSIS — E782 Mixed hyperlipidemia: Secondary | ICD-10-CM | POA: Diagnosis not present

## 2017-10-01 DIAGNOSIS — E876 Hypokalemia: Secondary | ICD-10-CM | POA: Diagnosis not present

## 2017-10-01 DIAGNOSIS — R0602 Shortness of breath: Secondary | ICD-10-CM | POA: Diagnosis not present

## 2017-10-01 DIAGNOSIS — G4730 Sleep apnea, unspecified: Secondary | ICD-10-CM

## 2017-10-01 DIAGNOSIS — J45909 Unspecified asthma, uncomplicated: Secondary | ICD-10-CM | POA: Diagnosis present

## 2017-10-01 DIAGNOSIS — B961 Klebsiella pneumoniae [K. pneumoniae] as the cause of diseases classified elsewhere: Secondary | ICD-10-CM | POA: Diagnosis not present

## 2017-10-01 DIAGNOSIS — M170 Bilateral primary osteoarthritis of knee: Secondary | ICD-10-CM | POA: Diagnosis present

## 2017-10-01 DIAGNOSIS — Z96651 Presence of right artificial knee joint: Secondary | ICD-10-CM | POA: Diagnosis present

## 2017-10-01 DIAGNOSIS — Z7901 Long term (current) use of anticoagulants: Secondary | ICD-10-CM | POA: Diagnosis not present

## 2017-10-01 DIAGNOSIS — I480 Paroxysmal atrial fibrillation: Secondary | ICD-10-CM | POA: Diagnosis not present

## 2017-10-01 DIAGNOSIS — L0390 Cellulitis, unspecified: Secondary | ICD-10-CM | POA: Diagnosis not present

## 2017-10-01 DIAGNOSIS — K219 Gastro-esophageal reflux disease without esophagitis: Secondary | ICD-10-CM | POA: Diagnosis not present

## 2017-10-01 DIAGNOSIS — L03116 Cellulitis of left lower limb: Secondary | ICD-10-CM | POA: Diagnosis not present

## 2017-10-01 DIAGNOSIS — G4733 Obstructive sleep apnea (adult) (pediatric): Secondary | ICD-10-CM

## 2017-10-01 DIAGNOSIS — R600 Localized edema: Secondary | ICD-10-CM | POA: Diagnosis not present

## 2017-10-01 DIAGNOSIS — I10 Essential (primary) hypertension: Secondary | ICD-10-CM | POA: Diagnosis not present

## 2017-10-01 DIAGNOSIS — N390 Urinary tract infection, site not specified: Secondary | ICD-10-CM | POA: Diagnosis not present

## 2017-10-01 DIAGNOSIS — I110 Hypertensive heart disease with heart failure: Secondary | ICD-10-CM | POA: Diagnosis present

## 2017-10-01 DIAGNOSIS — G4700 Insomnia, unspecified: Secondary | ICD-10-CM | POA: Diagnosis not present

## 2017-10-01 DIAGNOSIS — N400 Enlarged prostate without lower urinary tract symptoms: Secondary | ICD-10-CM | POA: Diagnosis not present

## 2017-10-01 DIAGNOSIS — N401 Enlarged prostate with lower urinary tract symptoms: Secondary | ICD-10-CM | POA: Diagnosis not present

## 2017-10-01 DIAGNOSIS — Z79899 Other long term (current) drug therapy: Secondary | ICD-10-CM | POA: Diagnosis not present

## 2017-10-01 DIAGNOSIS — R1310 Dysphagia, unspecified: Secondary | ICD-10-CM | POA: Diagnosis present

## 2017-10-01 DIAGNOSIS — Z888 Allergy status to other drugs, medicaments and biological substances status: Secondary | ICD-10-CM | POA: Diagnosis not present

## 2017-10-01 DIAGNOSIS — I5031 Acute diastolic (congestive) heart failure: Secondary | ICD-10-CM | POA: Diagnosis not present

## 2017-10-01 DIAGNOSIS — A419 Sepsis, unspecified organism: Secondary | ICD-10-CM | POA: Diagnosis not present

## 2017-10-01 DIAGNOSIS — I5030 Unspecified diastolic (congestive) heart failure: Secondary | ICD-10-CM | POA: Diagnosis not present

## 2017-10-01 DIAGNOSIS — R7989 Other specified abnormal findings of blood chemistry: Secondary | ICD-10-CM | POA: Diagnosis not present

## 2017-10-01 DIAGNOSIS — R6520 Severe sepsis without septic shock: Secondary | ICD-10-CM | POA: Diagnosis present

## 2017-10-01 DIAGNOSIS — J811 Chronic pulmonary edema: Secondary | ICD-10-CM | POA: Diagnosis not present

## 2017-10-01 DIAGNOSIS — R740 Nonspecific elevation of levels of transaminase and lactic acid dehydrogenase [LDH]: Secondary | ICD-10-CM | POA: Diagnosis not present

## 2017-10-01 LAB — URINALYSIS, ROUTINE W REFLEX MICROSCOPIC
Bilirubin Urine: NEGATIVE
GLUCOSE, UA: NEGATIVE mg/dL
Ketones, ur: NEGATIVE mg/dL
Nitrite: NEGATIVE
PH: 6 (ref 5.0–8.0)
PROTEIN: NEGATIVE mg/dL
Specific Gravity, Urine: 1.015 (ref 1.005–1.030)
WBC, UA: >50 WBC/hpf — ABNORMAL HIGH (ref 0–5)

## 2017-10-01 LAB — BASIC METABOLIC PANEL
Anion gap: 4 — ABNORMAL LOW (ref 5–15)
BUN: 26 mg/dL — AB (ref 6–20)
CALCIUM: 8.5 mg/dL — AB (ref 8.9–10.3)
CO2: 24 mmol/L (ref 22–32)
CREATININE: 1.05 mg/dL (ref 0.61–1.24)
Chloride: 111 mmol/L (ref 101–111)
GFR calc Af Amer: >60 mL/min (ref >60)
GFR calc non Af Amer: >60 mL/min (ref >60)
GLUCOSE: 99 mg/dL (ref 65–99)
Potassium: 3.8 mmol/L (ref 3.5–5.1)
Sodium: 139 mmol/L (ref 135–145)

## 2017-10-01 LAB — CBC WITH DIFFERENTIAL/PLATELET
BASOS ABS: 0 10*3/uL (ref 0.0–0.1)
Basophils Relative: 0 %
EOS PCT: 0 %
Eosinophils Absolute: 0 10*3/uL (ref 0.0–0.7)
HCT: 42.4 % (ref 39.0–52.0)
Hemoglobin: 13.8 g/dL (ref 13.0–17.0)
LYMPHS ABS: 0.3 10*3/uL — AB (ref 0.7–4.0)
Lymphocytes Relative: 3 %
MCH: 29.4 pg (ref 26.0–34.0)
MCHC: 32.5 g/dL (ref 30.0–36.0)
MCV: 90.4 fL (ref 78.0–100.0)
MONO ABS: 0.4 10*3/uL (ref 0.1–1.0)
MONOS PCT: 4 %
Neutro Abs: 9.5 10*3/uL — ABNORMAL HIGH (ref 1.7–7.7)
Neutrophils Relative %: 93 %
PLATELETS: 134 10*3/uL — AB (ref 150–400)
RBC: 4.69 MIL/uL (ref 4.22–5.81)
RDW: 14.4 % (ref 11.5–15.5)
WBC: 10.2 10*3/uL (ref 4.0–10.5)

## 2017-10-01 LAB — MRSA PCR SCREENING: MRSA by PCR: NEGATIVE

## 2017-10-01 LAB — I-STAT CG4 LACTIC ACID, ED
LACTIC ACID, VENOUS: 2.71 mmol/L — AB (ref 0.5–1.9)
LACTIC ACID, VENOUS: 3.25 mmol/L — AB (ref 0.5–1.9)

## 2017-10-01 LAB — LACTIC ACID, PLASMA
Lactic Acid, Venous: 2 mmol/L (ref 0.5–1.9)
Lactic Acid, Venous: 2.9 mmol/L (ref 0.5–1.9)

## 2017-10-01 LAB — MAGNESIUM: Magnesium: 1.4 mg/dL — ABNORMAL LOW (ref 1.7–2.4)

## 2017-10-01 LAB — PHOSPHORUS: PHOSPHORUS: 3 mg/dL (ref 2.5–4.6)

## 2017-10-01 MED ORDER — OXYBUTYNIN CHLORIDE ER 5 MG PO TB24
10.0000 mg | ORAL_TABLET | Freq: Every day | ORAL | Status: DC
  Administered 2017-10-01 – 2017-10-09 (×9): 10 mg via ORAL
  Filled 2017-10-01 (×10): qty 2

## 2017-10-01 MED ORDER — PIPERACILLIN-TAZOBACTAM 3.375 G IVPB 30 MIN
3.3750 g | Freq: Once | INTRAVENOUS | Status: AC
  Administered 2017-10-01: 3.375 g via INTRAVENOUS
  Filled 2017-10-01 (×2): qty 50

## 2017-10-01 MED ORDER — OMEGA-3-ACID ETHYL ESTERS 1 G PO CAPS
2.0000 g | ORAL_CAPSULE | Freq: Two times a day (BID) | ORAL | Status: DC
  Administered 2017-10-01 – 2017-10-03 (×6): 2 g via ORAL
  Filled 2017-10-01 (×6): qty 2

## 2017-10-01 MED ORDER — PRAVASTATIN SODIUM 40 MG PO TABS
40.0000 mg | ORAL_TABLET | Freq: Every day | ORAL | Status: DC
  Administered 2017-10-01 – 2017-10-04 (×4): 40 mg via ORAL
  Filled 2017-10-01 (×5): qty 1

## 2017-10-01 MED ORDER — FAMOTIDINE 20 MG PO TABS: 20.0000 mg | ORAL_TABLET | Freq: Two times a day (BID) | ORAL | Status: DC | PRN

## 2017-10-01 MED ORDER — LACTATED RINGERS IV BOLUS
1000.0000 mL | Freq: Once | INTRAVENOUS | Status: AC
  Administered 2017-10-01: 1000 mL via INTRAVENOUS

## 2017-10-01 MED ORDER — VANCOMYCIN HCL IN DEXTROSE 750-5 MG/150ML-% IV SOLN
750.0000 mg | Freq: Two times a day (BID) | INTRAVENOUS | Status: AC
  Administered 2017-10-01 – 2017-10-06 (×9): 750 mg via INTRAVENOUS
  Filled 2017-10-01 (×10): qty 150

## 2017-10-01 MED ORDER — MELATONIN 5 MG PO TABS
5.0000 mg | ORAL_TABLET | Freq: Every day | ORAL | Status: DC
  Filled 2017-10-01 (×2): qty 1

## 2017-10-01 MED ORDER — SODIUM CHLORIDE 0.9 % IV SOLN
1.0000 g | INTRAVENOUS | Status: DC
  Administered 2017-10-02: 1 g via INTRAVENOUS
  Filled 2017-10-01: qty 1
  Filled 2017-10-01: qty 10

## 2017-10-01 MED ORDER — SODIUM CHLORIDE 0.9 % IV SOLN
2000.0000 mg | Freq: Once | INTRAVENOUS | Status: AC
  Administered 2017-10-01: 2000 mg via INTRAVENOUS
  Filled 2017-10-01 (×2): qty 2000

## 2017-10-01 MED ORDER — MELATONIN 3 MG PO TABS
6.0000 mg | ORAL_TABLET | Freq: Every day | ORAL | Status: DC
  Administered 2017-10-02 – 2017-10-04 (×3): 6 mg via ORAL
  Filled 2017-10-01 (×5): qty 2

## 2017-10-01 MED ORDER — ONDANSETRON HCL 4 MG/2ML IJ SOLN
4.0000 mg | Freq: Once | INTRAMUSCULAR | Status: AC
  Administered 2017-10-01: 4 mg via INTRAVENOUS
  Filled 2017-10-01: qty 2

## 2017-10-01 MED ORDER — SODIUM CHLORIDE 0.9 % IV SOLN
2.0000 g | Freq: Once | INTRAVENOUS | Status: AC
  Administered 2017-10-01: 2 g via INTRAVENOUS
  Filled 2017-10-01: qty 20

## 2017-10-01 MED ORDER — SODIUM CHLORIDE 0.9 % IV SOLN
INTRAVENOUS | Status: AC
  Filled 2017-10-01: qty 10

## 2017-10-01 MED ORDER — VITAMIN C 500 MG PO TABS
500.0000 mg | ORAL_TABLET | Freq: Every day | ORAL | Status: DC
  Administered 2017-10-01 – 2017-10-04 (×4): 500 mg via ORAL
  Filled 2017-10-01 (×5): qty 1

## 2017-10-01 MED ORDER — SALINE SPRAY 0.65 % NA SOLN: 1.0000 | NASAL | Status: DC | PRN

## 2017-10-01 MED ORDER — LACTATED RINGERS IV BOLUS
500.0000 mL | Freq: Once | INTRAVENOUS | Status: AC
  Administered 2017-10-01: 500 mL via INTRAVENOUS

## 2017-10-01 MED ORDER — ADULT MULTIVITAMIN W/MINERALS CH
1.0000 | ORAL_TABLET | Freq: Every day | ORAL | Status: DC
  Administered 2017-10-01 – 2017-10-09 (×9): 1 via ORAL
  Filled 2017-10-01 (×9): qty 1

## 2017-10-01 MED ORDER — APIXABAN 5 MG PO TABS
5.0000 mg | ORAL_TABLET | Freq: Two times a day (BID) | ORAL | Status: DC
  Administered 2017-10-01 – 2017-10-09 (×17): 5 mg via ORAL
  Filled 2017-10-01 (×17): qty 1

## 2017-10-01 MED ORDER — ACETAMINOPHEN 325 MG PO TABS
650.0000 mg | ORAL_TABLET | Freq: Four times a day (QID) | ORAL | Status: DC | PRN
  Administered 2017-10-01 – 2017-10-09 (×17): 650 mg via ORAL
  Filled 2017-10-01 (×17): qty 2

## 2017-10-01 MED ORDER — ACETAMINOPHEN 650 MG RE SUPP
650.0000 mg | Freq: Four times a day (QID) | RECTAL | Status: DC | PRN
  Administered 2017-10-02: 650 mg via RECTAL
  Filled 2017-10-01: qty 1

## 2017-10-01 MED ORDER — METOPROLOL TARTRATE 25 MG PO TABS
25.0000 mg | ORAL_TABLET | Freq: Two times a day (BID) | ORAL | Status: DC
  Administered 2017-10-01 – 2017-10-02 (×3): 25 mg via ORAL
  Filled 2017-10-01 (×3): qty 1

## 2017-10-01 MED ORDER — ONDANSETRON HCL 4 MG/2ML IJ SOLN: 4.0000 mg | Freq: Four times a day (QID) | INTRAMUSCULAR | Status: DC | PRN

## 2017-10-01 MED ORDER — MAGNESIUM SULFATE 4 GM/100ML IV SOLN
4.0000 g | Freq: Once | INTRAVENOUS | Status: AC
  Administered 2017-10-01: 4 g via INTRAVENOUS
  Filled 2017-10-01: qty 100

## 2017-10-01 MED ORDER — MIDAZOLAM HCL 2 MG/2ML IJ SOLN
1.0000 mg | Freq: Once | INTRAMUSCULAR | Status: AC
  Administered 2017-10-01: 1 mg via INTRAVENOUS
  Filled 2017-10-01: qty 2

## 2017-10-01 MED ORDER — PANTOPRAZOLE SODIUM 40 MG PO TBEC
40.0000 mg | DELAYED_RELEASE_TABLET | Freq: Every day | ORAL | Status: DC
  Administered 2017-10-01 – 2017-10-06 (×6): 40 mg via ORAL
  Filled 2017-10-01 (×6): qty 1

## 2017-10-01 MED ORDER — DIPHENHYDRAMINE HCL 25 MG PO CAPS
50.0000 mg | ORAL_CAPSULE | Freq: Every evening | ORAL | Status: DC | PRN
  Administered 2017-10-02 – 2017-10-05 (×3): 50 mg via ORAL
  Filled 2017-10-01 (×4): qty 2

## 2017-10-01 MED ORDER — ACETAMINOPHEN 325 MG PO TABS
650.0000 mg | ORAL_TABLET | Freq: Once | ORAL | Status: AC
  Administered 2017-10-01: 650 mg via ORAL
  Filled 2017-10-01: qty 2

## 2017-10-01 MED ORDER — ALPRAZOLAM 0.25 MG PO TABS
0.2500 mg | ORAL_TABLET | Freq: Two times a day (BID) | ORAL | Status: DC | PRN
  Administered 2017-10-01 – 2017-10-02 (×3): 0.25 mg via ORAL
  Filled 2017-10-01 (×4): qty 1

## 2017-10-01 MED ORDER — LORATADINE 10 MG PO TABS
10.0000 mg | ORAL_TABLET | Freq: Every day | ORAL | Status: DC
  Administered 2017-10-01 – 2017-10-05 (×5): 10 mg via ORAL
  Filled 2017-10-01 (×5): qty 1

## 2017-10-01 MED ORDER — ONDANSETRON HCL 4 MG PO TABS: 4.0000 mg | ORAL_TABLET | Freq: Four times a day (QID) | ORAL | Status: DC | PRN

## 2017-10-01 NOTE — ED Notes (Signed)
Marked patient's left leg where erythema stops.

## 2017-10-01 NOTE — ED Provider Notes (Signed)
[facility] EMERGENCY DEPARTMENT Provider Note   CSN: [id number] Arrival date & time: 09/30/17  2149     History   Chief Complaint Chief Complaint  Patient presents with  . Fever    HPI Gary Carroll is a 72 y.o. male.  HPI  72 year old male with history of prediabetes, A. fib on Eliquis, diverticulosis comes in with chief complaint of fevers and weakness.  Patient also has history of cellulitis and bacteremia.  According to patient he was feeling well throughout the day until about an hour before ED arrival when he suddenly started having chills and feeling weak.  Patient also felt like he had a fever and took Tylenol.  Patient is having nausea without vomiting.  He denies any chest pain, cough, abdominal pain, diarrhea, rash, back pain, UTI-like symptoms.  Upon further evaluation it was noted that patient had an admission for cellulitis and severe sepsis few months ago.   Past Medical History:  Diagnosis Date  . Allergic rhinitis   . Arthritis   . Asthma    seasonal per pt  . Bilateral knee pain    OA  . Diverticulosis 02/2012   seen on colonoscopy  . Erectile dysfunction   . GERD (gastroesophageal reflux disease)   . Heart murmur   . Hiatal hernia   . HLD (hyperlipidemia)   . HTN (hypertension)   . Hydronephrosis determined by ultrasound 08/22/2015  . Overactive bladder   . Prediabetes   . Ringing of ears   . Urinary frequency   . Urinary urgency   . Wears glasses     Patient Active Problem List   Diagnosis Date Noted  . BPH (benign prostatic hyperplasia) 02/04/2017  . Urinary tract infection 02/04/2017  . Cellulitis of left lower extremity 02/03/2017  . GERD (gastroesophageal reflux disease) 02/03/2017  . AF (paroxysmal atrial fibrillation) ([place]) 02/03/2017  . Sepsis due to cellulitis ([place]) 02/03/2017  . New onset atrial fibrillation ([place]) 08/24/2015  . Diuresis excessive 08/22/2015  . Bladder outlet obstruction 08/22/2015  . Hydronephrosis  determined by ultrasound 08/22/2015  . AKI (acute kidney injury) ([place]) 08/22/2015  . Hypernatremia 08/22/2015  . Acute blood loss anemia 08/22/2015  . Hematuria 08/22/2015  . 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 Peripheral edema 08/31/2012  . Urinary frequency 08/31/2012  . Diverticulosis 03/23/2012  . Impaired fasting glucose 02/06/2012  . Decreased libido 03/07/2011  . Erectile dysfunction 03/07/2011  . Essential hypertension, benign 12/20/2010  . Mixed hyperlipidemia 12/20/2010  . ALLERGIC RHINITIS, SEASONAL 05/02/2010  . 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 HIATAL HERNIA, HX OF 05/02/2010    Past Surgical History:  Procedure Laterality Date  . CHOLECYSTECTOMY    . COLONOSCOPY  02/2012   diverticulosis; Dr. Deatra Ina  . FINGER SURGERY Right   . TONSILLECTOMY    . 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 Lorre Nick)  . TOTAL KNEE ARTHROPLASTY Right 06/19/2015   Procedure: TOTAL KNEE ARTHROPLASTY;  Surgeon: Vickey Huger, MD;  Location: [place];  Service: Orthopedics;  Laterality: Right;        Home Medications    Prior to Admission medications   Medication Sig Start Date End Date Taking? Authorizing Provider  apixaban (ELIQUIS) 5 MG TABS tablet Take 1 tablet (5 mg total) by mouth 2 (two) times daily. 08/24/15   Isaac Bliss, Rayford Halsted, MD  diphenhydrAMINE (BENADRYL) 25 MG tablet Take 50 mg by mouth at bedtime as needed for sleep.    [provider]  ferrous sulfate  325 (65 FE) MG tablet Take 325 mg by mouth daily. 08/04/15   [provider]  loratadine (CLARITIN) 10 MG tablet Take 10 mg by mouth daily.    [provider]  Melatonin 5 MG TABS Take 5 mg by mouth at bedtime.    [provider]  metoprolol tartrate (LOPRESSOR) 25 MG tablet Take 1 tablet (25 mg total) by mouth 2 (two) times daily. 08/24/15   Isaac Bliss, Rayford Halsted, MD  Multiple Vitamins-Minerals (MULTIVITAL) tablet Take 1 tablet by mouth daily.      [provider]  Omega-3 Fatty  Acids (FISH OIL) 1000 MG CAPS Take 4 capsules by mouth daily.     [provider]  oxybutynin (DITROPAN-XL) 10 MG 24 hr tablet Take 10 mg by mouth daily.    [provider]  pantoprazole (PROTONIX) 40 MG tablet Take 1 tablet (40 mg total) by mouth daily. 02/10/17   Kathie Dike, MD  pravastatin (PRAVACHOL) 40 MG tablet Take 1 tablet (40 mg total) by mouth daily. 09/16/12   Rita Ohara, MD  ranitidine (ZANTAC) 150 MG tablet Take 150 mg by mouth at bedtime as needed for heartburn.    [provider]  sodium chloride (OCEAN) 0.65 % SOLN nasal spray Place 1 spray into both nostrils as needed for congestion.    [provider]  tamsulosin (FLOMAX) 0.4 MG CAPS capsule Take 0.4 mg by mouth every evening.    [provider]  vitamin C (ASCORBIC ACID) 500 MG tablet Take 500 mg by mouth daily.    [provider]    Family History Family History  Problem Relation Age of Onset  . Uterine cancer Mother   . Cancer Mother        uterine  . Coronary artery disease Father   . Coronary artery disease Paternal Grandfather   . Heart disease Paternal Grandfather   . Colon cancer Neg Hx   . Esophageal cancer Neg Hx   . Stomach cancer Neg Hx   . Rectal cancer Neg Hx     Social History Social History   Tobacco Use  . Smoking status: Former Smoker    Types: Cigarettes    Last attempt to quit: 01/18/2010    Years since quitting: 7.7  . Smokeless tobacco: Never Used  . Tobacco comment: Smoked for 25 years- quit September 2011.  Passive exposure from wife  Substance Use Topics  . Alcohol use: Yes    Alcohol/week: 0.6 - 1.2 oz    Types: 1 - 2 Glasses of wine per week    Comment: Stopped drinking 1-2 glasses of wine daily  . Drug use: No     Allergies   Diovan [valsartan] and Tagamet [cimetidine]   Review of Systems Review of Systems  Constitutional: Positive for activity change, chills and fever.  HENT: Negative for congestion.   Respiratory:  Negative for cough and shortness of breath.   Gastrointestinal: Positive for nausea. Negative for abdominal pain and vomiting.  Genitourinary: Negative for dysuria.  Musculoskeletal: Negative for back pain, neck pain and neck stiffness.  Allergic/Immunologic: Negative for immunocompromised state.  Neurological: Negative for headaches.  Hematological: Bruises/bleeds easily.  All other systems reviewed and are negative.    Physical Exam Updated Vital Signs BP (!) 142/63   Pulse (!) 109   Temp (!) 102.2 F (39 C) (Rectal)   Resp (!) 26   Ht 6' (1.829 m)   Wt 83.5 kg (184 lb)   SpO2 95%  BMI 24.95 kg/m   Physical Exam  Constitutional: He is oriented to person, place, and time. He appears well-developed.  HENT:  Head: Atraumatic.  Neck: Neck supple.  Cardiovascular: Normal rate.  Pulmonary/Chest: Effort normal.  Abdominal: Soft.  Mild tenderness in the right lower quadrant  Musculoskeletal:  Left lower extremity has erythema, edema and is warm to touch.  Neurological: He is alert and oriented to person, place, and time.  Skin: Skin is warm.  Nursing note and vitals reviewed.    ED Treatments / Results  Labs (all labs ordered are listed, but only abnormal results are displayed) Labs Reviewed  CBC WITH DIFFERENTIAL/PLATELET - Abnormal; Notable for the following components:      Result Value   Neutro Abs 7.9 (*)    Lymphs Abs 0.5 (*)    All other components within normal limits  COMPREHENSIVE METABOLIC PANEL - Abnormal; Notable for the following components:   Glucose, Bld 106 (*)    BUN 31 (*)    All other components within normal limits  URINALYSIS, ROUTINE W REFLEX MICROSCOPIC - Abnormal; Notable for the following components:   APPearance CLOUDY (*)    Hgb urine dipstick SMALL (*)    Leukocytes, UA LARGE (*)    WBC, UA >50 (*)    Bacteria, UA MANY (*)    Crystals PRESENT (*)    All other components within normal limits  I-STAT CG4 LACTIC ACID, ED - Abnormal;  Notable for the following components:   Lactic Acid, Venous 2.71 (*)    All other components within normal limits  I-STAT CG4 LACTIC ACID, ED - Abnormal; Notable for the following components:   Lactic Acid, Venous 3.25 (*)    All other components within normal limits  CULTURE, BLOOD (ROUTINE X 2)  CULTURE, BLOOD (ROUTINE X 2)  URINE CULTURE  TROPONIN I  CBC WITH DIFFERENTIAL/PLATELET  BASIC METABOLIC PANEL  LACTIC ACID, PLASMA    EKG EKG Interpretation  Date/Time:  Tuesday Sep 30 2017 22:49:49 EDT Ventricular Rate:  106 PR Interval:    QRS Duration: 88 QT Interval:  314 QTC Calculation: 417 R Axis:   -43 Text Interpretation:  Sinus tachycardia Left axis deviation No acute changes Nonspecific ST and T wave abnormality Confirmed by Varney Biles ([id number]) on 09/30/2017 11:10:50 PM   Radiology Dg Chest 2 View  Result Date: 09/30/2017 CLINICAL DATA:  Code sepsis. Fever and weakness since 17HRS today. Hx of asthma, heart murmur, HTN, and former smoker. EXAM: CHEST - 2 VIEW COMPARISON:  Chest x-rays dated 02/04/2017 and 02/03/2017. FINDINGS: Stable cardiomegaly. Lungs are clear. No pleural effusion or pneumothorax seen. Osseous structures about the chest are unremarkable. IMPRESSION: No active cardiopulmonary disease. No evidence of pneumonia or pulmonary edema. Electronically Signed   By: Franki Cabot M.D.   On: 09/30/2017 23:43    Procedures Procedures (including critical care time)  CRITICAL CARE Performed by: Jermell Holeman   Total critical care time: 39 minutes for severe sepsis  Critical care time was exclusive of separately billable procedures and treating other patients.  Critical care was necessary to treat or prevent imminent or life-threatening deterioration.  Critical care was time spent personally by me on the following activities: development of treatment plan with patient and/or surrogate as well as nursing, discussions with consultants, evaluation of patient's  response to treatment, examination of patient, obtaining history from patient or surrogate, ordering and performing treatments and interventions, ordering and review of laboratory studies, ordering and review of radiographic studies,  pulse oximetry and re-evaluation of patient's condition.  Medications Ordered in ED Medications  0.9 %  sodium chloride infusion (0 mLs Intravenous Paused 10/01/17 0041)  vancomycin (VANCOCIN) 2,000 mg in sodium chloride 0.9 % 500 mL IVPB (2,000 mg Intravenous New Bag/Given 10/01/17 0226)  ondansetron (ZOFRAN) tablet 4 mg (has no administration in time range)    Or  ondansetron (ZOFRAN) injection 4 mg (has no administration in time range)  acetaminophen (TYLENOL) tablet 650 mg (has no administration in time range)    Or  acetaminophen (TYLENOL) suppository 650 mg (has no administration in time range)  sodium chloride 0.9 % bolus 1,000 mL (0 mLs Intravenous Stopped 10/01/17 0105)  ondansetron (ZOFRAN) injection 4 mg (4 mg Intravenous Given 10/01/17 0039)  lactated ringers bolus 1,000 mL (1,000 mLs Intravenous New Bag/Given 10/01/17 0038)  cefTRIAXone (ROCEPHIN) 2 g in sodium chloride 0.9 % 100 mL IVPB (0 g Intravenous Stopped 10/01/17 0153)  acetaminophen (TYLENOL) tablet 650 mg (650 mg Oral Given 10/01/17 0114)  midazolam (VERSED) injection 1 mg (1 mg Intravenous Given 10/01/17 0140)     Initial Impression / Assessment and Plan / ED Course  I have reviewed the triage vital signs and the nursing notes.  Pertinent labs & imaging results that were available during my care of the patient were reviewed by me and considered in my medical decision making (see chart for details).  Clinical Course as of Oct 01 236  Wed Oct 01, 2017  0100 Initial labs have resulted and they appear reassuring.  Lactic acid is 2.71.  Patient has remained tachycardic in the ED, with a normal appearing troponin. At this time patient is noted to have mild right-sided abdominal tenderness.  He has  chest given [place] a urine sample.  If the UA is normal then we will shortly get CT scan of the chest and abdomen, the former because patient is noted to have O2 sats in the lower 90s.   [AN]  0121 It appears that patient is having delirium. 2 L of IV fluid has been ordered given BUN/creatinine ratio of > 30 and elevated lactate.  IV ceftriaxone has been ordered.  No clear source at this time.   [AN]  0237 Appears concerning for UTI. Patient is received ceftriaxone and vancomycin.  Urinalysis, Routine w reflex microscopic(!) [AN]  [id number] Repeat lactate is higher than before.  Patient is being admitted to stepdown ICU which is appropriate.  Lactic Acid, Venous(!!): 3.25 [AN]    Clinical Course User Index [AN] Varney Biles, MD    72 year old male comes in with chief complaint of fevers.  He is noted to be febrile, tachycardic and on history there is no specific source of infection.  Upon chart review was noted the patient has cellulitis, and patient's left lower extremity does appear to have edema, erythema and calor.  Patient is also noted to have mild lower quadrant abdominal pain is slightly hypoxic. Chest x-ray ordered to rule out any pneumonia. Because patient is on Eliquis, pretest probability for PE is low at this time.  However, if patient's O2 sats remained low then he might need CT PE. Given the lower quadrant abdominal pain UA has been ordered. If the UA is negative we will consider getting CT scan.   Final Clinical Impressions(s) / ED Diagnoses   Final diagnoses:  Severe sepsis ([place])  Cellulitis of left lower extremity    ED Discharge Orders    None       Varney Biles, MD  10/01/17 0238  

## 2017-10-01 NOTE — ED Notes (Signed)
CRITICAL VALUE ALERT  Critical Value:  Lactic 3.25  Date & Time Notied:  10/01/17 0240  Provider Notified: Olevia Bowens Admitting provider   Orders Received/Actions taken: Continue with bolus

## 2017-10-01 NOTE — ED Notes (Signed)
CRITICAL VALUE ALERT  Critical Value:  Lactic 2.71  Date & Time Notied:  10/01/17 0012  Provider Notified:  NANAVATI EDP  Orders Received/Actions taken: Continue with sepsis protocol

## 2017-10-01 NOTE — Progress Notes (Signed)
Patient admitted to the hospital earlier this morning by Dr. Olevia Bowens.  Patient seen and examined.  Continues to have pain, erythema in the left lower extremity.  Has any shortness of breath.  He was admitted to the hospital with cellulitis of left lower leg as well as possible urinary tract infection.  Started on intravenous fluids, antibiotics.  We will follow-up cultures.  Keep lower extremity elevated.  Lactic acid elevated, but trending down with hydration.  Further plans per H&P done by Dr. Olevia Bowens earlier today.  Gary Carroll

## 2017-10-01 NOTE — H&P (Signed)
History and Physical    Zade Falkner Pitz TTS:[id number] DOB: April 22, 1946 DOA: 09/30/2017  PCP: Celene Squibb, MD  Patient coming from: Home.  I have personally briefly reviewed patient's old medical records in [place]  Chief Complaint: Fever and weakness.  HPI: KAMARI BUCH is a 72 y.o. male with medical history significant of allergic rhinitis, osteoarthritis, seasonal asthma, diverticulosis, ED, GERD, hiatal hernia, hyperlipidemia, history of hydronephrosis, history of overactive bladder, prediabetes, tinnitus, urinary frequency and urgency who is coming to the emergency department with complaints of fever, chills and weakness for about an hour.  He mentions mentions that he took 1000 mg of Tylenol and came to the emergency department.  His left lower extremity has become erythematosus, erythematosus, warm and tender.  He denies any known trauma to the area, but occasionally scratches it.  He complains of sinus pressure headache and a left earache for the past 2 days.  He denies sore throat, rhinorrhea, cough, chest pain, dyspnea, palpitations, dizziness, PND orthopnea.  However he gets frequent lower extremity edema.  No abdominal pain, nausea, emesis, diarrhea, melena or hematochezia he occasionally gets constipated.  He also complains of mild dysuria, frequency and difficulty initiating urination.  He has stopped taking Flomax because he states that it makes his urinary symptoms worse.  Denies polyuria, polydipsia or polyphagia.  No heat or cold intolerance.  ED Course: Initial vital signs temperature 98.2 F, pulse 101, respirations 14, blood pressure 160/69 mmHg and O2 sat 96%.  The patient was given a 1000 mL normal saline bolus, at 1000 mL lactated Ringer's bolus, 2 g of Rocephin and vancomycin 2000 mg in the ED.  Review of Systems: As per HPI otherwise 10 point review of systems negative.    Past Medical History:  Diagnosis Date  . Allergic rhinitis   . Arthritis   .  Asthma    seasonal per pt  . Bilateral knee pain    OA  . Diverticulosis 02/2012   seen on colonoscopy  . Erectile dysfunction   . GERD (gastroesophageal reflux disease)   . Heart murmur   . Hiatal hernia   . HLD (hyperlipidemia)   . HTN (hypertension)   . Hydronephrosis determined by ultrasound 08/22/2015  . Overactive bladder   . Prediabetes   . Ringing of ears   . Urinary frequency   . Urinary urgency   . Wears glasses     Past Surgical History:  Procedure Laterality Date  . CHOLECYSTECTOMY    . COLONOSCOPY  02/2012   diverticulosis; Dr. Deatra Ina  . FINGER SURGERY Right   . TONSILLECTOMY    . 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 Lorre Nick)  . TOTAL KNEE ARTHROPLASTY Right 06/19/2015   Procedure: TOTAL KNEE ARTHROPLASTY;  Surgeon: Vickey Huger, MD;  Location: [facility];  Service: Orthopedics;  Laterality: Right;     reports that he quit smoking about 7 years ago. His smoking use included cigarettes. He has never used smokeless tobacco. He reports that he drinks about 0.6 - 1.2 oz of alcohol per week. He reports that he does not use drugs.  Allergies  Allergen Reactions  . Diovan [Valsartan] Cough  . Tagamet [Cimetidine] Other (See Comments)    irrittability    Family History  Problem Relation Age of Onset  . Uterine cancer Mother   . Cancer Mother        uterine  . Coronary artery disease Father   . Coronary artery disease Paternal Grandfather   .  Heart disease Paternal Grandfather   . Colon cancer Neg Hx   . Esophageal cancer Neg Hx   . Stomach cancer Neg Hx   . Rectal cancer Neg Hx     Prior to Admission medications   Medication Sig Start Date End Date Taking? Authorizing Provider  apixaban (ELIQUIS) 5 MG TABS tablet Take 1 tablet (5 mg total) by mouth 2 (two) times daily. 08/24/15   Isaac Bliss, Rayford Halsted, MD  diphenhydrAMINE (BENADRYL) 25 MG tablet Take 50 mg by mouth at bedtime as needed for sleep.    [provider]  ferrous sulfate 325 (65 FE) MG  tablet Take 325 mg by mouth daily. 08/04/15   [provider]  loratadine (CLARITIN) 10 MG tablet Take 10 mg by mouth daily.    [provider]  Melatonin 5 MG TABS Take 5 mg by mouth at bedtime.    [provider]  metoprolol tartrate (LOPRESSOR) 25 MG tablet Take 1 tablet (25 mg total) by mouth 2 (two) times daily. 08/24/15   Isaac Bliss, Rayford Halsted, MD  Multiple Vitamins-Minerals (MULTIVITAL) tablet Take 1 tablet by mouth daily.      [provider]  Omega-3 Fatty Acids (FISH OIL) 1000 MG CAPS Take 4 capsules by mouth daily.     [provider]  oxybutynin (DITROPAN-XL) 10 MG 24 hr tablet Take 10 mg by mouth daily.    [provider]  pantoprazole (PROTONIX) 40 MG tablet Take 1 tablet (40 mg total) by mouth daily. 02/10/17   Kathie Dike, MD  pravastatin (PRAVACHOL) 40 MG tablet Take 1 tablet (40 mg total) by mouth daily. 09/16/12   Rita Ohara, MD  ranitidine (ZANTAC) 150 MG tablet Take 150 mg by mouth at bedtime as needed for heartburn.    [provider]  sodium chloride (OCEAN) 0.65 % SOLN nasal spray Place 1 spray into both nostrils as needed for congestion.    [provider]  tamsulosin (FLOMAX) 0.4 MG CAPS capsule Take 0.4 mg by mouth every evening.    [provider]  vitamin C (ASCORBIC ACID) 500 MG tablet Take 500 mg by mouth daily.    [provider]    Physical Exam: Vitals:   10/01/17 0200 10/01/17 0223 10/01/17 0227 10/01/17 0230  BP: (!) 131/56  127/61 (!) 142/63  Pulse: (!) 113  (!) 108 (!) 109  Resp:      Temp:  (!) 102.2 F (39 C)    TempSrc:  Rectal    SpO2: 92%  95% 95%  Weight:      Height:        Constitutional: NAD, calm, comfortable Eyes: PERRL, lids and conjunctivae normal ENMT: Mucous membranes are mildly dry. Posterior pharynx clear of any exudate or lesions. Neck: normal, supple, no masses, no thyromegaly Respiratory: clear to auscultation bilaterally, no  wheezing, no crackles. Normal respiratory effort. No accessory muscle use.  Cardiovascular: Tachycardic at 103, but with a regular rhythm rhythm, positive 2/6 SEM, no rubs / gallops.  2+ right and 3+ on lower extremity pitting edema. 2+ pedal pulses. No carotid bruits.  Abdomen: Soft, no tenderness, no masses palpated. No hepatosplenomegaly. Bowel sounds positive.  Musculoskeletal: no clubbing / cyanosis.  Good ROM, no contractures. Normal muscle tone.  Skin: Extensive erythema, calor, edema and tenderness to palpation on LLE.  Please see pictures below. Neurologic: CN 2-12 grossly intact. Sensation intact, DTR normal. Strength 5/5 in all 4.  Psychiatric: Normal judgment and insight. Alert and  oriented x 3. Normal mood.    Anterior view.   Medial view   Lateral view.   Left calf.   Labs on Admission: I have personally reviewed following labs and imaging studies  CBC: Recent Labs  Lab 09/30/17 2259  WBC 8.5  NEUTROABS 7.9*  HGB 15.5  HCT 47.5  MCV 90.8  PLT 096   Basic Metabolic Panel: Recent Labs  Lab 09/30/17 2259  NA 139  K 4.1  CL 103  CO2 26  GLUCOSE 106*  BUN 31*  CREATININE 1.08  CALCIUM 9.7   GFR: Estimated Creatinine Clearance: 67.9 mL/min (by C-G formula based on SCr of 1.08 mg/dL). Liver Function Tests: Recent Labs  Lab 09/30/17 2259  AST 30  ALT 37  ALKPHOS 56  BILITOT 0.7  PROT 7.3  ALBUMIN 4.0   No results for input(s): LIPASE, AMYLASE in the last 168 hours. No results for input(s): AMMONIA in the last 168 hours. Coagulation Profile: No results for input(s): INR, PROTIME in the last 168 hours. Cardiac Enzymes: Recent Labs  Lab 09/30/17 2259  TROPONINI <0.03   BNP (last 3 results) No results for input(s): PROBNP in the last 8760 hours. HbA1C: No results for input(s): HGBA1C in the last 72 hours. CBG: No results for input(s): GLUCAP in the last 168 hours. Lipid Profile: No results for input(s): CHOL, HDL, LDLCALC, TRIG, CHOLHDL,  LDLDIRECT in the last 72 hours. Thyroid Function Tests: No results for input(s): TSH, T4TOTAL, FREET4, T3FREE, THYROIDAB in the last 72 hours. Anemia Panel: No results for input(s): VITAMINB12, FOLATE, FERRITIN, TIBC, IRON, RETICCTPCT in the last 72 hours. Urine analysis:    Component Value Date/Time   COLORURINE YELLOW 10/01/2017 0120   APPEARANCEUR CLOUDY (A) 10/01/2017 0120   LABSPEC 1.015 10/01/2017 0120   PHURINE 6.0 10/01/2017 0120   GLUCOSEU NEGATIVE 10/01/2017 0120   HGBUR SMALL (A) 10/01/2017 0120   BILIRUBINUR NEGATIVE 10/01/2017 0120   BILIRUBINUR neg 08/31/2012 1203   KETONESUR NEGATIVE 10/01/2017 0120   PROTEINUR NEGATIVE 10/01/2017 0120   UROBILINOGEN negative 08/31/2012 1203   UROBILINOGEN 0.2 05/31/2010 1125   NITRITE NEGATIVE 10/01/2017 0120   LEUKOCYTESUR LARGE (A) 10/01/2017 0120    Radiological Exams on Admission: Dg Chest 2 View  Result Date: 09/30/2017 CLINICAL DATA:  Code sepsis. Fever and weakness since 17HRS today. Hx of asthma, heart murmur, HTN, and former smoker. EXAM: CHEST - 2 VIEW COMPARISON:  Chest x-rays dated 02/04/2017 and 02/03/2017. FINDINGS: Stable cardiomegaly. Lungs are clear. No pleural effusion or pneumothorax seen. Osseous structures about the chest are unremarkable. IMPRESSION: No active cardiopulmonary disease. No evidence of pneumonia or pulmonary edema. Electronically Signed   By: Franki Cabot M.D.   On: 09/30/2017 23:43    EKG: Independently reviewed. Vent. rate 106 BPM PR interval * ms QRS duration 88 ms QT/QTc 314/417 ms P-R-T axes 57 -43 66 Sinus tachycardia Left axis deviation No acute changes Nonspecific ST and T wave abnormality  Assessment/Plan Principal Problem:   Sepsis due to cellulitis (HCC)   Cellulitis of left lower extremity Admit to stepdown/inpatient. Continue analgesics as needed. Continue vancomycin per pharmacy. Zosyn 3.375 g IVPB every 8 hours. Follow-up blood culture and sensitivity. Consider  checking venous Doppler if no resolution. In any case, the patient is on apixaban twice a day.  Active Problems:   Urinary tract infection Continue IV antibiotics. Follow-up blood cultures. Follow urine culture and sensitivity.    Essential hypertension, benign Continue metoprolol 25 mg by mouth twice a day.  Monitor blood pressure and heart rate.    Mixed hyperlipidemia Continue pravastatin 40 mg p.o. daily. Monitor LFTs as needed. Fasting lipid profile to be followed as an outpatient.    GERD (gastroesophageal reflux disease) Continue Protonix 40 mg p.o. daily. Continue ranitidine 150 mg p.o. at bedtime as needed for heartburn.    AF (paroxysmal atrial fibrillation) (HCC) CHA?DS?-VASc Score of at least 4 On metoprolol for rate control. Continue Eliquis 5 mg p.o. twice daily Monitor for electrolytes and kidney and optimize.    BPH (benign prostatic hyperplasia) Longer using Flomax. Continue oxybutynin.  Use    Sleep apnea The patient declined to use 1 of our own devices. He states that he does not use his at home.    DVT prophylaxis: Lovenox SQ. Code Status: Full code. Family Communication: None at bedside. Disposition Plan: Admit for IV antibiotics for several days. Consults called: Admission status: Inpatient/stepdown.   Reubin Milan MD [facility] Pager [phone]  If 7PM-7AM, please contact night-coverage [email or web address] Password [email or web address]  10/01/2017, 3:08 AM

## 2017-10-01 NOTE — ED Notes (Signed)
Date and time results received: 10/01/17 0251 (use smartphrase ".now" to insert current time)  Test: lactic acid Critical Value: 2.0  Name of Provider Notified: Olevia Bowens  Orders Received? Or Actions Taken?: no new orders.

## 2017-10-01 NOTE — Progress Notes (Signed)
Pt becoming increasingly confused and agitated. Pt pulling at lines and tubes and attempting to get OOB. Pt unsteady on his feet and impulsive. Bed alarm placed and pt reoriented to situation. Tylenol given for tmax of 101.9, Md notified. Will continue to monitor.

## 2017-10-01 NOTE — Progress Notes (Addendum)
Pharmacy Antibiotic Note  Gary Carroll is a 72 y.o. male admitted on 09/30/2017 with cellulitis.  Pharmacy has been consulted for Vancomycin dosing.  Plan:  Vancomycin 2000mg  loading dose, then 750mg  IV every 12 hours.  Goal trough 10-15 mcg/mL. F/U cxs and clinical progress Monitor V/S, labs, and levels as indicated  Height: 6' (182.9 cm) Weight: 254 lb 3.1 oz (115.3 kg) IBW/kg (Calculated) : 77.6  Temp (24hrs), Avg:100.6 F (38.1 C), Min:98.2 F (36.8 C), Max:102.8 F (39.3 C)  Recent Labs  Lab 09/30/17 2259 09/30/17 2312 10/01/17 0006 10/01/17 0213 10/01/17 0707  WBC 8.5  --   --   --  10.2  CREATININE 1.08  --   --   --  1.05  LATICACIDVEN  --  2.0* 2.71* 3.25*  --     Estimated Creatinine Clearance: 83.4 mL/min (by C-G formula based on SCr of 1.05 mg/dL).    Allergies  Allergen Reactions  . Diovan [Valsartan] Cough  . Tagamet [Cimetidine] Other (See Comments)    irrittability    Antimicrobials this admission: Vancomycin 5/15 >>  Ceftriaxone 2gm IV and zosyn 3.375gm IV x 1 5/15 in ED  Dose adjustments this admission: N/A  Microbiology results: 5/14 BCx: ngtd 5/14 UCx: pending  Thank you for allowing pharmacy to be a part of this patient's care.  Isac Sarna, BS [place], [place] Clinical Pharmacist Pager [phone] 10/01/2017 7:36 AM

## 2017-10-02 ENCOUNTER — Inpatient Hospital Stay (HOSPITAL_COMMUNITY): Payer: Medicare HMO

## 2017-10-02 ENCOUNTER — Other Ambulatory Visit (HOSPITAL_COMMUNITY): Payer: Medicare HMO

## 2017-10-02 ENCOUNTER — Encounter (HOSPITAL_COMMUNITY): Payer: Self-pay | Admitting: Family Medicine

## 2017-10-02 DIAGNOSIS — G4730 Sleep apnea, unspecified: Secondary | ICD-10-CM

## 2017-10-02 DIAGNOSIS — K219 Gastro-esophageal reflux disease without esophagitis: Secondary | ICD-10-CM

## 2017-10-02 LAB — CBC WITH DIFFERENTIAL/PLATELET
BASOS PCT: 0 %
Basophils Absolute: 0 10*3/uL (ref 0.0–0.1)
Eosinophils Absolute: 0 10*3/uL (ref 0.0–0.7)
Eosinophils Relative: 0 %
HCT: 43.2 % (ref 39.0–52.0)
HEMOGLOBIN: 13.8 g/dL (ref 13.0–17.0)
LYMPHS ABS: 0.6 10*3/uL — AB (ref 0.7–4.0)
LYMPHS PCT: 4 %
MCH: 29.3 pg (ref 26.0–34.0)
MCHC: 31.9 g/dL (ref 30.0–36.0)
MCV: 91.7 fL (ref 78.0–100.0)
Monocytes Absolute: 0.5 10*3/uL (ref 0.1–1.0)
Monocytes Relative: 3 %
NEUTROS ABS: 13.9 10*3/uL — AB (ref 1.7–7.7)
Neutrophils Relative %: 93 %
Platelets: 133 10*3/uL — ABNORMAL LOW (ref 150–400)
RBC: 4.71 MIL/uL (ref 4.22–5.81)
RDW: 15.2 % (ref 11.5–15.5)
WBC: 15 10*3/uL — ABNORMAL HIGH (ref 4.0–10.5)

## 2017-10-02 LAB — BASIC METABOLIC PANEL
Anion gap: 8 (ref 5–15)
BUN: 19 mg/dL (ref 6–20)
CHLORIDE: 105 mmol/L (ref 101–111)
CO2: 24 mmol/L (ref 22–32)
Calcium: 8.2 mg/dL — ABNORMAL LOW (ref 8.9–10.3)
Creatinine, Ser: 0.96 mg/dL (ref 0.61–1.24)
GFR calc Af Amer: >60 mL/min (ref >60)
GFR calc non Af Amer: >60 mL/min (ref >60)
Glucose, Bld: 96 mg/dL (ref 65–99)
Potassium: 3.7 mmol/L (ref 3.5–5.1)
SODIUM: 137 mmol/L (ref 135–145)

## 2017-10-02 LAB — LACTIC ACID, PLASMA: LACTIC ACID, VENOUS: 1.7 mmol/L (ref 0.5–1.9)

## 2017-10-02 LAB — MAGNESIUM: MAGNESIUM: 2.1 mg/dL (ref 1.7–2.4)

## 2017-10-02 MED ORDER — METOPROLOL TARTRATE 25 MG PO TABS
12.5000 mg | ORAL_TABLET | Freq: Two times a day (BID) | ORAL | Status: DC
  Administered 2017-10-03 – 2017-10-09 (×13): 12.5 mg via ORAL
  Filled 2017-10-02 (×13): qty 1

## 2017-10-02 MED ORDER — SODIUM CHLORIDE 0.9 % IV BOLUS
1000.0000 mL | Freq: Once | INTRAVENOUS | Status: AC
  Administered 2017-10-02: 1000 mL via INTRAVENOUS

## 2017-10-02 MED ORDER — FUROSEMIDE 10 MG/ML IJ SOLN
60.0000 mg | Freq: Once | INTRAMUSCULAR | Status: AC
  Administered 2017-10-02: 60 mg via INTRAVENOUS
  Filled 2017-10-02: qty 6

## 2017-10-02 MED ORDER — SODIUM CHLORIDE 0.9 % IV SOLN
1000.0000 mL | INTRAVENOUS | Status: DC
  Administered 2017-10-02 (×2): 1000 mL via INTRAVENOUS

## 2017-10-02 MED ORDER — ORAL CARE MOUTH RINSE
15.0000 mL | Freq: Two times a day (BID) | OROMUCOSAL | Status: DC
  Administered 2017-10-03 (×2): 15 mL via OROMUCOSAL

## 2017-10-02 MED ORDER — SODIUM CHLORIDE 0.9 % IV SOLN
1.0000 g | Freq: Once | INTRAVENOUS | Status: AC
  Administered 2017-10-02: 1 g via INTRAVENOUS
  Filled 2017-10-02: qty 1

## 2017-10-02 MED ORDER — SODIUM CHLORIDE 0.9 % IV SOLN
1.0000 g | Freq: Three times a day (TID) | INTRAVENOUS | Status: DC
  Administered 2017-10-02 – 2017-10-07 (×15): 1 g via INTRAVENOUS
  Filled 2017-10-02 (×17): qty 1

## 2017-10-02 MED ORDER — SODIUM CHLORIDE 0.9 % IV SOLN: 1000.0000 mL | INTRAVENOUS | Status: DC

## 2017-10-02 MED ORDER — DOXYCYCLINE HYCLATE 100 MG PO TABS: 100.0000 mg | ORAL_TABLET | Freq: Two times a day (BID) | ORAL | Status: DC

## 2017-10-02 NOTE — Care Management Note (Signed)
Case Management Note  Patient Details  Name: Gary Carroll MRN: [id number] Date of Birth: 12-20-1945  Subjective/Objective:  Adm with sepsis, cellulitis. From home alone. Has Cane, RW at home if needed. No HH pta. Has PCP-Dr. Nevada Crane, also goes to [facility] clinic intermittently. Still drives.               Action/Plan: CM follow for needs.   Expected Discharge Date:    unk              Expected Discharge Plan:     In-House Referral:     Discharge planning Services  CM Consult  Post Acute Care Choice:    Choice offered to:     DME Arranged:    DME Agency:     HH Arranged:    HH Agency:     Status of Service:  In process, will continue to follow  If discussed at Long Length of Stay Meetings, dates discussed:    Additional Comments:  Joandry Slagter, [name], RN 10/02/2017, 1:17 PM

## 2017-10-02 NOTE — Progress Notes (Signed)
PROGRESS NOTE    Gary Carroll  EYC:[id number]  DOB: 1945-09-06  DOA: 09/30/2017 PCP: Celene Squibb, MD   Brief Admission Hx: Gary Carroll is a 72 y.o. male with medical history significant of allergic rhinitis, osteoarthritis, seasonal asthma, diverticulosis, ED, GERD, hiatal hernia, hyperlipidemia, history of hydronephrosis, history of overactive bladder, prediabetes, tinnitus, urinary frequency and urgency who is coming to the emergency department with complaints of fever, chills and weakness.  He was admitted with cellulitis of LLE.   MDM/Assessment & Plan:   1. Sepsis - secondary to cellulitis - continue intensive treatments. Recheck lactate.  Bolus IVFs.  2. Cellulitis of LLE  - continue IV antibiotics. Follow cultures.  No growth to date.  ELEVATE EXTREMITIES.  3. UTI - continue IV antibiotics.  Clinically not much improved, adding doxycycline to regimen, continue vanc/ceftriaxone.  4. Essential hypertension - stable, continue home meds.   5. Mixed hyperlipidemia - stable on home meds.  6. GERD - continue home meds.  7. Paroxysmal Atrial Fibrillation - continue metoprolol for rate control and apixaban for anticoagulation.  8. BPH - stable. Monitoring urine output.  9. OSA - Pt refusing to use CPAP.   DVT prophylaxis: lovenox Code Status: Full  Family Communication:  Disposition Plan: ongoing inpatient treatment needed   Subjective: Pt having more confusion.  He also wanting to go home.  I explained to him that he needs more inpatient treatment at this time.   Objective: Vitals:   10/02/17 0300 10/02/17 0400 10/02/17 0500 10/02/17 0600  BP: 120/60 127/69 133/68 (!) 130/100  Pulse: 88 94 94 98  Resp: (!) 29 (!) 23 (!) 30 (!) 34  Temp:  98.1 F (36.7 C)    TempSrc:  Axillary    SpO2: 95% 95% 96% 95%  Weight:   116.1 kg (255 lb 15.3 oz)   Height:        Intake/Output Summary (Last 24 hours) at 10/02/2017 0714 Last data filed at 10/02/2017 0500 Gross per  24 hour  Intake 3650 ml  Output 1400 ml  Net 2250 ml   Filed Weights   09/30/17 2157 10/01/17 0324 10/02/17 0500  Weight: 83.5 kg (184 lb) 115.3 kg (254 lb 3.1 oz) 116.1 kg (255 lb 15.3 oz)     REVIEW OF SYSTEMS  As per history otherwise all reviewed and reported negative  Exam:  General exam: awake, alert, NAD, cooperative.  Respiratory system: Clear. No increased work of breathing. Cardiovascular system: S1 & S2 heard, RRR. No JVD, murmurs, gallops, clicks or pedal edema. Gastrointestinal system: Abdomen is nondistended, soft and nontender. Normal bowel sounds heard. Central nervous system: Alert and oriented. No focal neurological deficits. Extremities: severe pronounced edema and erythema LLE, hot to touch      Data Reviewed: Basic Metabolic Panel: Recent Labs  Lab 09/30/17 2259 10/01/17 0707 10/02/17 0408  NA 139 139 137  K 4.1 3.8 3.7  CL 103 111 105  CO2 26 24 24   GLUCOSE 106* 99 96  BUN 31* 26* 19  CREATININE 1.08 1.05 0.96  CALCIUM 9.7 8.5* 8.2*  MG  --  1.4*  --   PHOS  --  3.0  --    Liver Function Tests: Recent Labs  Lab 09/30/17 2259  AST 30  ALT 37  ALKPHOS 56  BILITOT 0.7  PROT 7.3  ALBUMIN 4.0   No results for input(s): LIPASE, AMYLASE in the last 168 hours. No results for input(s): AMMONIA in the last 168 hours.  CBC: Recent Labs  Lab 09/30/17 2259 10/01/17 0707 10/02/17 0408  WBC 8.5 10.2 15.0*  NEUTROABS 7.9* 9.5* PENDING  HGB 15.5 13.8 13.8  HCT 47.5 42.4 43.2  MCV 90.8 90.4 91.7  PLT 165 134* 133*   Cardiac Enzymes: Recent Labs  Lab 09/30/17 2259  TROPONINI <0.03   CBG (last 3)  No results for input(s): GLUCAP in the last 72 hours. Recent Results (from the past 240 hour(s))  Blood culture (routine x 2)     Status: None (Preliminary result)   Collection Time: 09/30/17 10:59 PM  Result Value Ref Range Status   Specimen Description BLOOD RIGHT ARM  Final   Special Requests   Final    BOTTLES DRAWN AEROBIC AND  ANAEROBIC Blood Culture adequate volume   Culture   Final    NO GROWTH 2 DAYS Performed at [facility], [address] [place], [place] [address]    Report Status PENDING  Incomplete  Blood culture (routine x 2)     Status: None (Preliminary result)   Collection Time: 09/30/17 11:12 PM  Result Value Ref Range Status   Specimen Description BLOOD LEFT ARM  Final   Special Requests   Final    BOTTLES DRAWN AEROBIC AND ANAEROBIC Blood Culture adequate volume   Culture   Final    NO GROWTH 2 DAYS Performed at [facility], [address], [place], [place] [address]    Report Status PENDING  Incomplete  MRSA PCR Screening     Status: None   Collection Time: 10/01/17  3:05 AM  Result Value Ref Range Status   MRSA by PCR NEGATIVE NEGATIVE Final    Comment:        The GeneXpert MRSA Assay (FDA approved for NASAL specimens only), is one component of a comprehensive MRSA colonization surveillance program. It is not intended to diagnose MRSA infection nor to guide or monitor treatment for MRSA infections. Performed at [facility], [address] [place], [place] [address]      Studies: Dg Chest 2 View  Result Date: 09/30/2017 CLINICAL DATA:  Code sepsis. Fever and weakness since 17HRS today. Hx of asthma, heart murmur, HTN, and former smoker. EXAM: CHEST - 2 VIEW COMPARISON:  Chest x-rays dated 02/04/2017 and 02/03/2017. FINDINGS: Stable cardiomegaly. Lungs are clear. No pleural effusion or pneumothorax seen. Osseous structures about the chest are unremarkable. IMPRESSION: No active cardiopulmonary disease. No evidence of pneumonia or pulmonary edema. Electronically Signed   By: Franki Cabot M.D.   On: 09/30/2017 23:43     Scheduled Meds: . apixaban  5 mg Oral BID  . loratadine  10 mg Oral Daily  . Melatonin  6 mg Oral QHS  . metoprolol tartrate  25 mg Oral BID  . multivitamin with minerals  1 tablet Oral Daily  . omega-3 acid ethyl esters  2 g Oral BID  . oxybutynin  10 mg  Oral Daily  . pantoprazole  40 mg Oral Daily  . pravastatin  40 mg Oral Daily  . vitamin C  500 mg Oral Daily   Continuous Infusions: . sodium chloride 125 mL/hr at 10/02/17 0400  . cefTRIAXone (ROCEPHIN)  IV Stopped (10/02/17 0050)  . vancomycin Stopped (10/02/17 8841)    Principal Problem:   Sepsis due to cellulitis [facility]) Active Problems:   Essential hypertension, benign   Mixed hyperlipidemia   Cellulitis of left lower extremity   GERD (gastroesophageal reflux disease)   AF (paroxysmal atrial fibrillation) ([place])  BPH (benign prostatic hyperplasia)   Urinary tract infection   Sleep apnea  Critical Care Time spent: 34 mins  Irwin Brakeman, MD, FAAFP [facility] Pager [phone]  If 7PM-7AM, please contact night-coverage [email or web address] Password [email or web address] 10/02/2017, 7:14 AM    LOS: 1 day

## 2017-10-02 NOTE — Progress Notes (Addendum)
10/02/2017 4:04 PM  Came to re-assess patient and found him to be comfortable, no distress, resting on bed.  Leg is elevated. Vitals stable.  1000 cc urine in foley bag since lasix was given.  Left leg more red and inflamed.  Per cellulitis order-set bundle will add merrem to vancomycin for severe nonpurulent disease.  DC transfer, keep in SDU today.   Murvin Natal MD

## 2017-10-02 NOTE — Progress Notes (Signed)
Pt had rectal temp of 104.9 at shift change. RN gave tylenol suppository. Ice packs applied and fan placed on pt. Patient is alert and is responding and following commands. Foley temp placed. Mitts placed due to pt becoming agitated and pulling at cords.  Will continue to monitor.

## 2017-10-02 NOTE — Progress Notes (Signed)
Pharmacy Antibiotic Note  Gary Carroll is a 72 y.o. male admitted on 09/30/2017 with cellulitis.  Pharmacy has been consulted for Vancomycin dosing.  Plan:  Vancomycin 750mg  IV every 12 hours.  Goal trough 10-15 mcg/mL. Meropenem 1000 mg IV every 8 hours F/U cxs and clinical progress Monitor V/S, labs, and levels as indicated  Height: 6' (182.9 cm) Weight: 255 lb 15.3 oz (116.1 kg) IBW/kg (Calculated) : 77.6  Temp (24hrs), Avg:100.6 F (38.1 C), Min:98.1 F (36.7 C), Max:103.3 F (39.6 C)  Recent Labs  Lab 09/30/17 2259 09/30/17 2312 10/01/17 0006 10/01/17 0213 10/01/17 0704 10/01/17 0707 10/02/17 0408 10/02/17 0821  WBC 8.5  --   --   --   --  10.2 15.0*  --   CREATININE 1.08  --   --   --   --  1.05 0.96  --   LATICACIDVEN  --  2.0* 2.71* 3.25* 2.9*  --   --  1.7    Estimated Creatinine Clearance: 91.5 mL/min (by C-G formula based on SCr of 0.96 mg/dL).    Allergies  Allergen Reactions  . Diovan [Valsartan] Cough  . Tagamet [Cimetidine] Other (See Comments)    irrittability    Antimicrobials this admission: Vancomycin 5/15 >>  Meropenem 5/16>> Ceftriaxone 2gm IV and zosyn 3.375gm IV x 1 5/15 in ED  Dose adjustments this admission: N/A  Microbiology results: 5/14 BCx: ngtd 5/14 UCx: klebsiella pneumoniae  Thank you for allowing pharmacy to be a part of this patient's care.  Margot Ables, PharmD Clinical Pharmacist 10/02/2017 4:25 PM

## 2017-10-02 NOTE — Progress Notes (Signed)
Patient started to be very disoriented and breathing 40+ times a min, audible wheezes. Notified MD and received orders to give lasix.Urine output is >1000 after lasix given and pt seems to calming down some and breathing a little bit better. RR are still high but he seems to be breathing a little better, no more audible wheezes  Gary Carroll Rica Mote, RN

## 2017-10-03 ENCOUNTER — Other Ambulatory Visit (HOSPITAL_COMMUNITY): Payer: Medicare HMO

## 2017-10-03 ENCOUNTER — Inpatient Hospital Stay (HOSPITAL_COMMUNITY): Payer: Medicare HMO

## 2017-10-03 LAB — COMPREHENSIVE METABOLIC PANEL
ALBUMIN: 2.9 g/dL — AB (ref 3.5–5.0)
ALT: 28 U/L (ref 17–63)
ANION GAP: 6 (ref 5–15)
AST: 28 U/L (ref 15–41)
Alkaline Phosphatase: 43 U/L (ref 38–126)
BUN: 19 mg/dL (ref 6–20)
CHLORIDE: 104 mmol/L (ref 101–111)
CO2: 24 mmol/L (ref 22–32)
Calcium: 8 mg/dL — ABNORMAL LOW (ref 8.9–10.3)
Creatinine, Ser: 0.7 mg/dL (ref 0.61–1.24)
GFR calc non Af Amer: >60 mL/min (ref >60)
Glucose, Bld: 138 mg/dL — ABNORMAL HIGH (ref 65–99)
POTASSIUM: 3.4 mmol/L — AB (ref 3.5–5.1)
SODIUM: 134 mmol/L — AB (ref 135–145)
Total Bilirubin: 0.9 mg/dL (ref 0.3–1.2)
Total Protein: 6.2 g/dL — ABNORMAL LOW (ref 6.5–8.1)

## 2017-10-03 LAB — CBC WITH DIFFERENTIAL/PLATELET
Basophils Absolute: 0 10*3/uL (ref 0.0–0.1)
Basophils Relative: 0 %
EOS ABS: 0 10*3/uL (ref 0.0–0.7)
EOS PCT: 0 %
HCT: 42.7 % (ref 39.0–52.0)
Hemoglobin: 14.2 g/dL (ref 13.0–17.0)
LYMPHS ABS: 0.7 10*3/uL (ref 0.7–4.0)
LYMPHS PCT: 5 %
MCH: 29.8 pg (ref 26.0–34.0)
MCHC: 33.3 g/dL (ref 30.0–36.0)
MCV: 89.7 fL (ref 78.0–100.0)
MONOS PCT: 3 %
Monocytes Absolute: 0.4 10*3/uL (ref 0.1–1.0)
Neutro Abs: 13 10*3/uL — ABNORMAL HIGH (ref 1.7–7.7)
Neutrophils Relative %: 92 %
PLATELETS: 113 10*3/uL — AB (ref 150–400)
RBC: 4.76 MIL/uL (ref 4.22–5.81)
RDW: 14.9 % (ref 11.5–15.5)
WBC: 14.1 10*3/uL — AB (ref 4.0–10.5)

## 2017-10-03 LAB — URINE CULTURE: Culture: >=100000 — AB

## 2017-10-03 MED ORDER — FUROSEMIDE 10 MG/ML IJ SOLN
60.0000 mg | Freq: Once | INTRAMUSCULAR | Status: AC
  Administered 2017-10-03: 60 mg via INTRAVENOUS
  Filled 2017-10-03: qty 6

## 2017-10-03 MED ORDER — POTASSIUM CHLORIDE CRYS ER 20 MEQ PO TBCR
60.0000 meq | EXTENDED_RELEASE_TABLET | Freq: Two times a day (BID) | ORAL | Status: AC
  Administered 2017-10-03 – 2017-10-05 (×5): 60 meq via ORAL
  Filled 2017-10-03 (×6): qty 3

## 2017-10-03 MED ORDER — POTASSIUM CHLORIDE CRYS ER 20 MEQ PO TBCR: 40.0000 meq | EXTENDED_RELEASE_TABLET | Freq: Once | ORAL | Status: DC

## 2017-10-03 NOTE — Progress Notes (Signed)
PROGRESS NOTE    Gary Carroll  UKG:[id number]  DOB: February 19, 1946  DOA: 09/30/2017 PCP: Celene Squibb, MD   Brief Admission Hx: Gary Carroll is a 72 y.o. male with medical history significant of allergic rhinitis, osteoarthritis, seasonal asthma, diverticulosis, ED, GERD, hiatal hernia, hyperlipidemia, history of hydronephrosis, history of overactive bladder, prediabetes, tinnitus, urinary frequency and urgency who is coming to the emergency department with complaints of fever, chills and weakness.  He was admitted with severe cellulitis of LLE.   MDM/Assessment & Plan:   1. Sepsis - secondary to severe cellulitis - continue intensive treatments. Lactate trended down.    2. Severe nonpurulent Cellulitis of LLE  - continue IV antibiotics. Follow cultures.  No growth to date.  Continue order set bundle for cellulitis.  3. Pulmonary Edema - IV fluids discontinued,  Lasix IV ordered for an additional dose today.  Obtain Echo results, pending.  4. Klebsiella UTI - continue IV antibiotics.  Follow culture and sensitivities.   5. Essential hypertension - stable, continue home meds.   6. Mixed hyperlipidemia - stable on home meds.  7. Hypokalemia - oral replacement ordered.   8. GERD - continue home meds.  9. Paroxysmal Atrial Fibrillation - continue metoprolol for rate control and apixaban for anticoagulation.  10. BPH - stable. Monitoring urine output.  11. OSA - Pt refusing to use CPAP.   DVT prophylaxis: lovenox Code Status: Full  Family Communication:  Disposition Plan: ongoing inpatient treatment needed   Subjective: Pt continues to have intermittent confusion, he is still having fever but now trending down.   Objective: Vitals:   10/03/17 0300 10/03/17 0400 10/03/17 0430 10/03/17 0500  BP: 139/76 (!) 151/72  (!) 146/70  Pulse: 79 81 76 83  Resp: (!) 27 (!) 23 (!) 23 (!) 24  Temp: 99.5 F (37.5 C) 99.1 F (37.3 C) 99 F (37.2 C) 99 F (37.2 C)  TempSrc:  Core      SpO2: 96% 97% 97% 98%  Weight:    113.4 kg (250 lb)  Height:        Intake/Output Summary (Last 24 hours) at 10/03/2017 0713 Last data filed at 10/03/2017 0500 Gross per 24 hour  Intake 926.67 ml  Output 5800 ml  Net -4873.33 ml   Filed Weights   10/01/17 0324 10/02/17 0500 10/03/17 0500  Weight: 115.3 kg (254 lb 3.1 oz) 116.1 kg (255 lb 15.3 oz) 113.4 kg (250 lb)     REVIEW OF SYSTEMS  As per history otherwise all reviewed and reported negative  Exam:  General exam: awake, alert, NAD, cooperative.  Respiratory system: Clear. No increased work of breathing. Cardiovascular system: S1 & S2 heard, RRR. No JVD, murmurs, gallops, clicks or pedal edema. Gastrointestinal system: Abdomen is nondistended, soft and nontender. Normal bowel sounds heard. Central nervous system: Alert and oriented. No focal neurological deficits. Extremities: severe pronounced edema and erythema LLE slightly improved, hot to touch         Data Reviewed: Basic Metabolic Panel: Recent Labs  Lab 09/30/17 2259 10/01/17 0707 10/02/17 0408 10/03/17 0424  NA 139 139 137 134*  K 4.1 3.8 3.7 3.4*  CL 103 111 105 104  CO2 26 24 24 24   GLUCOSE 106* 99 96 138*  BUN 31* 26* 19 19  CREATININE 1.08 1.05 0.96 0.70  CALCIUM 9.7 8.5* 8.2* 8.0*  MG  --  1.4* 2.1  --   PHOS  --  3.0  --   --  Liver Function Tests: Recent Labs  Lab 09/30/17 2259 10/03/17 0424  AST 30 28  ALT 37 28  ALKPHOS 56 43  BILITOT 0.7 0.9  PROT 7.3 6.2*  ALBUMIN 4.0 2.9*   No results for input(s): LIPASE, AMYLASE in the last 168 hours. No results for input(s): AMMONIA in the last 168 hours. CBC: Recent Labs  Lab 09/30/17 2259 10/01/17 0707 10/02/17 0408 10/03/17 0424  WBC 8.5 10.2 15.0* 14.1*  NEUTROABS 7.9* 9.5* 13.9* 13.0*  HGB 15.5 13.8 13.8 14.2  HCT 47.5 42.4 43.2 42.7  MCV 90.8 90.4 91.7 89.7  PLT 165 134* 133* 113*   Cardiac Enzymes: Recent Labs  Lab 09/30/17 2259  TROPONINI <0.03   CBG (last  3)  No results for input(s): GLUCAP in the last 72 hours. Recent Results (from the past 240 hour(s))  Blood culture (routine x 2)     Status: None (Preliminary result)   Collection Time: 09/30/17 10:59 PM  Result Value Ref Range Status   Specimen Description BLOOD RIGHT ARM  Final   Special Requests   Final    BOTTLES DRAWN AEROBIC AND ANAEROBIC Blood Culture adequate volume   Culture   Final    NO GROWTH 3 DAYS Performed at [facility], [address] [place], [place] [address]    Report Status PENDING  Incomplete  Blood culture (routine x 2)     Status: None (Preliminary result)   Collection Time: 09/30/17 11:12 PM  Result Value Ref Range Status   Specimen Description BLOOD LEFT ARM  Final   Special Requests   Final    BOTTLES DRAWN AEROBIC AND ANAEROBIC Blood Culture adequate volume   Culture   Final    NO GROWTH 3 DAYS Performed at [facility], [address], [place], [place] [address]    Report Status PENDING  Incomplete  Urine culture     Status: Abnormal (Preliminary result)   Collection Time: 09/30/17 11:27 PM  Result Value Ref Range Status   Specimen Description   Final    URINE, CLEAN CATCH Performed at [facility], [address], [place], [place] [address]    Special Requests   Final    NONE Performed at [facility], [address], [place], [place] [address]    Culture >=100,000 COLONIES/mL KLEBSIELLA PNEUMONIAE (A)  Final   Report Status PENDING  Incomplete  MRSA PCR Screening     Status: None   Collection Time: 10/01/17  3:05 AM  Result Value Ref Range Status   MRSA by PCR NEGATIVE NEGATIVE Final    Comment:        The GeneXpert MRSA Assay (FDA approved for NASAL specimens only), is one component of a comprehensive MRSA colonization surveillance program. It is not intended to diagnose MRSA infection nor to guide or monitor treatment for MRSA infections. Performed at [facility], [address], [place], [place] [address]      Studies: Dg Chest  [facility] 1 View  Result Date: 10/02/2017 CLINICAL DATA:  Shortness of breath and fever EXAM: PORTABLE CHEST 1 VIEW COMPARISON:  Sep 30, 2017 FINDINGS: There is no edema or consolidation. There is a minimal left pleural effusion. There is mild cardiac enlargement with pulmonary venous hypertension. No adenopathy. No bone lesions. IMPRESSION: Pulmonary vascular congestion. Minimal left pleural effusion. No frank edema or consolidation. Electronically Signed   By: Lowella Grip III M.D.   On: 10/02/2017 15:02     Scheduled Meds: . apixaban  5 mg Oral  BID  . loratadine  10 mg Oral Daily  . mouth rinse  15 mL Mouth Rinse BID  . Melatonin  6 mg Oral QHS  . metoprolol tartrate  12.5 mg Oral BID  . multivitamin with minerals  1 tablet Oral Daily  . omega-3 acid ethyl esters  2 g Oral BID  . oxybutynin  10 mg Oral Daily  . pantoprazole  40 mg Oral Daily  . pravastatin  40 mg Oral Daily  . vitamin C  500 mg Oral Daily   Continuous Infusions: . sodium chloride 20 mL/hr at 10/02/17 2300  . meropenem (MERREM) IV Stopped (10/03/17 0000)  . vancomycin Stopped (10/03/17 0240)    Principal Problem:   Sepsis due to cellulitis [facility]) Active Problems:   Essential hypertension, benign   Mixed hyperlipidemia   Cellulitis of left lower extremity   GERD (gastroesophageal reflux disease)   AF (paroxysmal atrial fibrillation) (HCC)   BPH (benign prostatic hyperplasia)   Urinary tract infection   Sleep apnea  Critical Care Time spent: 31 mins  Irwin Brakeman, MD, FAAFP [facility] Pager [phone]  If 7PM-7AM, please contact night-coverage [email or web address] Password [email or web address] 10/03/2017, 7:13 AM    LOS: 2 days

## 2017-10-03 NOTE — Care Management Important Message (Signed)
Important Message  Patient Details  Name: LOUDON KRAKOW MRN: [id number] Date of Birth: [birth year]   Medicare Important Message Given:  Yes    Shelda Altes 10/03/2017, 11:15 AM

## 2017-10-04 ENCOUNTER — Inpatient Hospital Stay (HOSPITAL_COMMUNITY): Payer: Medicare HMO

## 2017-10-04 DIAGNOSIS — I5030 Unspecified diastolic (congestive) heart failure: Secondary | ICD-10-CM

## 2017-10-04 LAB — CBC WITH DIFFERENTIAL/PLATELET
BASOS ABS: 0 10*3/uL (ref 0.0–0.1)
BASOS PCT: 0 %
EOS PCT: 1 %
Eosinophils Absolute: 0.1 10*3/uL (ref 0.0–0.7)
HCT: 43 % (ref 39.0–52.0)
Hemoglobin: 13.9 g/dL (ref 13.0–17.0)
LYMPHS PCT: 10 %
Lymphs Abs: 0.8 10*3/uL (ref 0.7–4.0)
MCH: 28.8 pg (ref 26.0–34.0)
MCHC: 32.3 g/dL (ref 30.0–36.0)
MCV: 89.2 fL (ref 78.0–100.0)
Monocytes Absolute: 0.8 10*3/uL (ref 0.1–1.0)
Monocytes Relative: 9 %
NEUTROS ABS: 7.1 10*3/uL (ref 1.7–7.7)
Neutrophils Relative %: 80 %
PLATELETS: 128 10*3/uL — AB (ref 150–400)
RBC: 4.82 MIL/uL (ref 4.22–5.81)
RDW: 14.8 % (ref 11.5–15.5)
WBC: 8.8 10*3/uL (ref 4.0–10.5)

## 2017-10-04 LAB — COMPREHENSIVE METABOLIC PANEL
ALBUMIN: 2.6 g/dL — AB (ref 3.5–5.0)
ALT: 70 U/L — AB (ref 17–63)
ANION GAP: 7 (ref 5–15)
AST: 75 U/L — AB (ref 15–41)
Alkaline Phosphatase: 45 U/L (ref 38–126)
BUN: 20 mg/dL (ref 6–20)
CO2: 25 mmol/L (ref 22–32)
Calcium: 8.4 mg/dL — ABNORMAL LOW (ref 8.9–10.3)
Chloride: 105 mmol/L (ref 101–111)
Creatinine, Ser: 0.63 mg/dL (ref 0.61–1.24)
GFR calc non Af Amer: >60 mL/min (ref >60)
Glucose, Bld: 144 mg/dL — ABNORMAL HIGH (ref 65–99)
Potassium: 4.1 mmol/L (ref 3.5–5.1)
Sodium: 137 mmol/L (ref 135–145)
Total Bilirubin: 0.8 mg/dL (ref 0.3–1.2)
Total Protein: 6.2 g/dL — ABNORMAL LOW (ref 6.5–8.1)

## 2017-10-04 LAB — HEMOGLOBIN A1C
HEMOGLOBIN A1C: 5.6 % (ref 4.8–5.6)
MEAN PLASMA GLUCOSE: 114.02 mg/dL

## 2017-10-04 LAB — ECHOCARDIOGRAM COMPLETE
HEIGHTINCHES: 72 in
Weight: 3957.7 oz

## 2017-10-04 LAB — MAGNESIUM: Magnesium: 1.9 mg/dL (ref 1.7–2.4)

## 2017-10-04 MED ORDER — FUROSEMIDE 10 MG/ML IJ SOLN
80.0000 mg | Freq: Once | INTRAMUSCULAR | Status: AC
  Administered 2017-10-04: 80 mg via INTRAVENOUS
  Filled 2017-10-04: qty 8

## 2017-10-04 MED ORDER — HYDROCORTISONE 0.5 % EX CREA
TOPICAL_CREAM | CUTANEOUS | Status: DC | PRN
  Filled 2017-10-04: qty 28.35

## 2017-10-04 MED ORDER — HYDROCORTISONE 1 % EX CREA
TOPICAL_CREAM | CUTANEOUS | Status: DC | PRN
  Filled 2017-10-04: qty 1.5

## 2017-10-04 MED ORDER — KETOROLAC TROMETHAMINE 15 MG/ML IJ SOLN
15.0000 mg | Freq: Four times a day (QID) | INTRAMUSCULAR | Status: AC
  Administered 2017-10-04 – 2017-10-05 (×2): 15 mg via INTRAVENOUS
  Filled 2017-10-04 (×2): qty 1

## 2017-10-04 NOTE — Progress Notes (Signed)
  Echocardiogram 2D Echocardiogram has been performed.  Gary Carroll 10/04/2017, 7:59 AM

## 2017-10-04 NOTE — Progress Notes (Signed)
PROGRESS NOTE    Gary Carroll  XIP:[id number]  DOB: Jul 20, 1945  DOA: 09/30/2017 PCP: Celene Squibb, MD   Brief Admission Hx: AMIAS HUTCHINSON is a 72 y.o. male with medical history significant of allergic rhinitis, osteoarthritis, seasonal asthma, diverticulosis, ED, GERD, hiatal hernia, hyperlipidemia, history of hydronephrosis, history of overactive bladder, prediabetes, tinnitus, urinary frequency and urgency who is coming to the emergency department with complaints of fever, chills and weakness.  He was admitted with severe cellulitis of LLE.   MDM/Assessment & Plan:   1. Sepsis - secondary to severe cellulitis - continue intensive treatments. Lactate trended down. Hemodynamics much improved. Continue current treatments.    2. Severe nonpurulent Cellulitis of LLE  - continue IV antibiotics. Follow cultures.  No growth to date.  Fevers finally starting to come down.  Continue order set bundle for cellulitis.  3. Pulmonary Edema - IV fluids discontinued,  Lasix IV ordered for an additional dose today.  Echo pending. He has diuresed 1.5L.  4. Klebsiella UTI - continue IV antibiotics.  Follow culture and sensitivities.   5. Essential hypertension - stable, continue home meds.   6. Mixed hyperlipidemia - stable on home meds.  7. Hypokalemia - oral replacement ordered.   8. GERD - continue home meds.  9. Paroxysmal Atrial Fibrillation - continue metoprolol for rate control and apixaban for anticoagulation.  10. BPH - stable. Monitoring urine output.  11. OSA - Pt refusing to use CPAP.   DVT prophylaxis: lovenox Code Status: Full  Family Communication:  Disposition Plan: ongoing inpatient treatment needed but will transfer to med-surg   Subjective: Pt continues to ask when he can go home, his leg is elevated.   Objective: Vitals:   10/04/17 0300 10/04/17 0400 10/04/17 0500 10/04/17 0600  BP: (!) 145/72 139/73 138/74 126/65  Pulse: 78 81 83 80  Resp: (!) 33 (!) 31 (!) 24  (!) 30  Temp: 99.5 F (37.5 C) 99.5 F (37.5 C) 99.7 F (37.6 C) 99.7 F (37.6 C)  TempSrc:   Core   SpO2: 95% 94% 93% 94%  Weight:  112.2 kg (247 lb 5.7 oz)    Height:        Intake/Output Summary (Last 24 hours) at 10/04/2017 0654 Last data filed at 10/04/2017 0328 Gross per 24 hour  Intake 800 ml  Output 2700 ml  Net -1900 ml   Filed Weights   10/02/17 0500 10/03/17 0500 10/04/17 0400  Weight: 116.1 kg (255 lb 15.3 oz) 113.4 kg (250 lb) 112.2 kg (247 lb 5.7 oz)     REVIEW OF SYSTEMS  As per history otherwise all reviewed and reported negative  Exam:  General exam: awake, alert, NAD, cooperative.  Respiratory system: Clear. No increased work of breathing. Cardiovascular system: S1 & S2 heard, RRR. No JVD, murmurs, gallops, clicks or pedal edema. Gastrointestinal system: Abdomen is nondistended, soft and nontender. Normal bowel sounds heard. Central nervous system: Alert and oriented. No focal neurological deficits. Extremities: severe pronounced edema and erythema LLE slightly improved, hot to touch, elevated LLE         Data Reviewed: Basic Metabolic Panel: Recent Labs  Lab 09/30/17 2259 10/01/17 0707 10/02/17 0408 10/03/17 0424 10/04/17 0439  NA 139 139 137 134* 137  K 4.1 3.8 3.7 3.4* 4.1  CL 103 111 105 104 105  CO2 26 24 24 24 25   GLUCOSE 106* 99 96 138* 144*  BUN 31* 26* 19 19 20   CREATININE 1.08 1.05 0.96 0.70  0.63  CALCIUM 9.7 8.5* 8.2* 8.0* 8.4*  MG  --  1.4* 2.1  --  1.9  PHOS  --  3.0  --   --   --    Liver Function Tests: Recent Labs  Lab 09/30/17 2259 10/03/17 0424 10/04/17 0439  AST 30 28 75*  ALT 37 28 70*  ALKPHOS 56 43 45  BILITOT 0.7 0.9 0.8  PROT 7.3 6.2* 6.2*  ALBUMIN 4.0 2.9* 2.6*   No results for input(s): LIPASE, AMYLASE in the last 168 hours. No results for input(s): AMMONIA in the last 168 hours. CBC: Recent Labs  Lab 09/30/17 2259 10/01/17 0707 10/02/17 0408 10/03/17 0424 10/04/17 0439  WBC 8.5 10.2  15.0* 14.1* 8.8  NEUTROABS 7.9* 9.5* 13.9* 13.0* 7.1  HGB 15.5 13.8 13.8 14.2 13.9  HCT 47.5 42.4 43.2 42.7 43.0  MCV 90.8 90.4 91.7 89.7 89.2  PLT 165 134* 133* 113* 128*   Cardiac Enzymes: Recent Labs  Lab 09/30/17 2259  TROPONINI <0.03   CBG (last 3)  No results for input(s): GLUCAP in the last 72 hours. Recent Results (from the past 240 hour(s))  Blood culture (routine x 2)     Status: None (Preliminary result)   Collection Time: 09/30/17 10:59 PM  Result Value Ref Range Status   Specimen Description BLOOD RIGHT ARM  Final   Special Requests   Final    BOTTLES DRAWN AEROBIC AND ANAEROBIC Blood Culture adequate volume   Culture   Final    NO GROWTH 4 DAYS Performed at [facility], [address], [place], [place] [address]    Report Status PENDING  Incomplete  Blood culture (routine x 2)     Status: None (Preliminary result)   Collection Time: 09/30/17 11:12 PM  Result Value Ref Range Status   Specimen Description BLOOD LEFT ARM  Final   Special Requests   Final    BOTTLES DRAWN AEROBIC AND ANAEROBIC Blood Culture adequate volume   Culture   Final    NO GROWTH 4 DAYS Performed at [facility], [address] [place], [place] [address]    Report Status PENDING  Incomplete  Urine culture     Status: Abnormal   Collection Time: 09/30/17 11:27 PM  Result Value Ref Range Status   Specimen Description   Final    URINE, CLEAN CATCH Performed at [facility], [address], [place], [place] [address]    Special Requests   Final    NONE Performed at [facility], [address] [place], [place] [address]    Culture >=100,000 COLONIES/mL KLEBSIELLA PNEUMONIAE (A)  Final   Report Status 10/03/2017 FINAL  Final   Organism ID, Bacteria KLEBSIELLA PNEUMONIAE (A)  Final      Susceptibility   Klebsiella pneumoniae - MIC*    AMPICILLIN RESISTANT Resistant     CEFAZOLIN <=4 SENSITIVE Sensitive     CEFTRIAXONE <=1 SENSITIVE Sensitive     CIPROFLOXACIN <=0.25 SENSITIVE  Sensitive     GENTAMICIN <=1 SENSITIVE Sensitive     IMIPENEM <=0.25 SENSITIVE Sensitive     NITROFURANTOIN 64 INTERMEDIATE Intermediate     TRIMETH/SULFA <=20 SENSITIVE Sensitive     AMPICILLIN/SULBACTAM 4 SENSITIVE Sensitive     PIP/TAZO 16 SENSITIVE Sensitive     Extended ESBL NEGATIVE Sensitive     * >=100,000 COLONIES/mL KLEBSIELLA PNEUMONIAE  MRSA PCR Screening     Status: None   Collection Time: 10/01/17  3:05 AM  Result Value Ref Range Status  MRSA by PCR NEGATIVE NEGATIVE Final    Comment:        The GeneXpert MRSA Assay (FDA approved for NASAL specimens only), is one component of a comprehensive MRSA colonization surveillance program. It is not intended to diagnose MRSA infection nor to guide or monitor treatment for MRSA infections. Performed at [facility], [address] [place], [place] [address]      Studies: US Venous Img Lower Unilateral Left  Result Date: 10/03/2017 CLINICAL DATA:  72 year old male with left lower extremity pain EXAM: LEFT LOWER EXTREMITY VENOUS DOPPLER ULTRASOUND TECHNIQUE: Gray-scale sonography with graded compression, as well as color Doppler and duplex ultrasound were performed to evaluate the lower extremity deep venous systems from the level of the common femoral vein and including the common femoral, femoral, profunda femoral, popliteal and calf veins including the posterior tibial, peroneal and gastrocnemius veins when visible. The superficial great saphenous vein was also interrogated. Spectral Doppler was utilized to evaluate flow at rest and with distal augmentation maneuvers in the common femoral, femoral and popliteal veins. COMPARISON:  None. FINDINGS: Contralateral Common Femoral Vein: Respiratory phasicity is normal and symmetric with the symptomatic side. No evidence of thrombus. Normal compressibility. Common Femoral Vein: No evidence of thrombus. Normal compressibility, respiratory phasicity and response to augmentation.  Saphenofemoral Junction: No evidence of thrombus. Normal compressibility and flow on color Doppler imaging. Profunda Femoral Vein: No evidence of thrombus. Normal compressibility and flow on color Doppler imaging. Femoral Vein: No evidence of thrombus. Normal compressibility, respiratory phasicity and response to augmentation. Popliteal Vein: No evidence of thrombus. Normal compressibility, respiratory phasicity and response to augmentation. Calf Veins: No evidence of thrombus. Normal compressibility and flow on color Doppler imaging. Superficial Great Saphenous Vein: No evidence of thrombus. Normal compressibility and flow on color Doppler imaging. Other Findings:  Edema IMPRESSION: Sonographic survey of the left lower extremity negative for DVT. Lower extremity edema Electronically Signed   By: Corrie Mckusick D.O.   On: 10/03/2017 08:02   Dg Chest Port 1 View  Result Date: 10/03/2017 CLINICAL DATA:  Fever and weakness.  Shortness of breath. EXAM: PORTABLE CHEST 1 VIEW COMPARISON:  Oct 03, 2017 FINDINGS: The heart size is mildly enlarged. Pulmonary venous congestion/edema remains. No other interval changes. IMPRESSION: Mild cardiomegaly and pulmonary edema, similar in the interval. Electronically Signed   By: Dorise Bullion III M.D   On: 10/03/2017 07:42   Dg Chest Port 1 View  Result Date: 10/02/2017 CLINICAL DATA:  Shortness of breath and fever EXAM: PORTABLE CHEST 1 VIEW COMPARISON:  Sep 30, 2017 FINDINGS: There is no edema or consolidation. There is a minimal left pleural effusion. There is mild cardiac enlargement with pulmonary venous hypertension. No adenopathy. No bone lesions. IMPRESSION: Pulmonary vascular congestion. Minimal left pleural effusion. No frank edema or consolidation. Electronically Signed   By: Lowella Grip III M.D.   On: 10/02/2017 15:02    Scheduled Meds: . apixaban  5 mg Oral BID  . loratadine  10 mg Oral Daily  . mouth rinse  15 mL Mouth Rinse BID  . Melatonin  6 mg  Oral QHS  . metoprolol tartrate  12.5 mg Oral BID  . multivitamin with minerals  1 tablet Oral Daily  . omega-3 acid ethyl esters  2 g Oral BID  . oxybutynin  10 mg Oral Daily  . pantoprazole  40 mg Oral Daily  . potassium chloride  60 mEq Oral BID  . pravastatin  40 mg Oral Daily  .  vitamin C  500 mg Oral Daily   Continuous Infusions: . meropenem (MERREM) IV Stopped (10/04/17 0006)  . vancomycin Stopped (10/04/17 0328)    Principal Problem:   Sepsis due to cellulitis [facility]) Active Problems:   Essential hypertension, benign   Mixed hyperlipidemia   Cellulitis of left lower extremity   GERD (gastroesophageal reflux disease)   AF (paroxysmal atrial fibrillation) (HCC)   BPH (benign prostatic hyperplasia)   Urinary tract infection   Sleep apnea  Critical Care Time spent: 32 mins  Irwin Brakeman, MD, FAAFP [facility] Pager [phone]  If 7PM-7AM, please contact night-coverage [email or web address] Password [email or web address] 10/04/2017, 6:54 AM    LOS: 3 days

## 2017-10-05 ENCOUNTER — Inpatient Hospital Stay (HOSPITAL_COMMUNITY): Payer: Medicare HMO

## 2017-10-05 LAB — CBC WITH DIFFERENTIAL/PLATELET
BASOS ABS: 0 10*3/uL (ref 0.0–0.1)
Basophils Relative: 1 %
Eosinophils Absolute: 0.2 10*3/uL (ref 0.0–0.7)
Eosinophils Relative: 2 %
HEMATOCRIT: 44.3 % (ref 39.0–52.0)
HEMOGLOBIN: 14.4 g/dL (ref 13.0–17.0)
LYMPHS PCT: 15 %
Lymphs Abs: 1.3 10*3/uL (ref 0.7–4.0)
MCH: 29.4 pg (ref 26.0–34.0)
MCHC: 32.5 g/dL (ref 30.0–36.0)
MCV: 90.6 fL (ref 78.0–100.0)
MONO ABS: 0.9 10*3/uL (ref 0.1–1.0)
MONOS PCT: 12 %
Neutro Abs: 5.8 10*3/uL (ref 1.7–7.7)
Neutrophils Relative %: 70 %
Platelets: 147 10*3/uL — ABNORMAL LOW (ref 150–400)
RBC: 4.89 MIL/uL (ref 4.22–5.81)
RDW: 14.9 % (ref 11.5–15.5)
WBC: 8.2 10*3/uL (ref 4.0–10.5)

## 2017-10-05 LAB — CULTURE, BLOOD (ROUTINE X 2)
Culture: NO GROWTH
Culture: NO GROWTH
SPECIAL REQUESTS: ADEQUATE
Special Requests: ADEQUATE

## 2017-10-05 LAB — COMPREHENSIVE METABOLIC PANEL
ALK PHOS: 53 U/L (ref 38–126)
ALT: 193 U/L — ABNORMAL HIGH (ref 17–63)
AST: 164 U/L — AB (ref 15–41)
Albumin: 2.7 g/dL — ABNORMAL LOW (ref 3.5–5.0)
Anion gap: 8 (ref 5–15)
BILIRUBIN TOTAL: 0.7 mg/dL (ref 0.3–1.2)
BUN: 26 mg/dL — AB (ref 6–20)
CALCIUM: 8.8 mg/dL — AB (ref 8.9–10.3)
CO2: 25 mmol/L (ref 22–32)
CREATININE: 0.8 mg/dL (ref 0.61–1.24)
Chloride: 109 mmol/L (ref 101–111)
GFR calc Af Amer: >60 mL/min (ref >60)
Glucose, Bld: 134 mg/dL — ABNORMAL HIGH (ref 65–99)
POTASSIUM: 4.4 mmol/L (ref 3.5–5.1)
Sodium: 142 mmol/L (ref 135–145)
TOTAL PROTEIN: 6.4 g/dL — AB (ref 6.5–8.1)

## 2017-10-05 LAB — MAGNESIUM: MAGNESIUM: 2.1 mg/dL (ref 1.7–2.4)

## 2017-10-05 MED ORDER — SENNOSIDES-DOCUSATE SODIUM 8.6-50 MG PO TABS
2.0000 | ORAL_TABLET | Freq: Two times a day (BID) | ORAL | Status: DC
  Administered 2017-10-05 – 2017-10-07 (×4): 2 via ORAL
  Filled 2017-10-05 (×4): qty 2

## 2017-10-05 MED ORDER — ALPRAZOLAM 0.25 MG PO TABS
0.2500 mg | ORAL_TABLET | Freq: Three times a day (TID) | ORAL | Status: DC | PRN
  Administered 2017-10-05: 0.25 mg via ORAL
  Administered 2017-10-06 – 2017-10-08 (×3): 0.5 mg via ORAL
  Filled 2017-10-05 (×3): qty 2
  Filled 2017-10-05: qty 1

## 2017-10-05 MED ORDER — ALPRAZOLAM 0.25 MG PO TABS: 0.2500 mg | ORAL_TABLET | Freq: Three times a day (TID) | ORAL | Status: DC | PRN

## 2017-10-05 NOTE — Progress Notes (Signed)
PROGRESS NOTE    Amadu Schlageter Perl  EGB:[id number]  DOB: 16-Apr-1946  DOA: 09/30/2017 PCP: Celene Squibb, MD   Brief Admission Hx: Gary Carroll is a 72 y.o. male with medical history significant of allergic rhinitis, osteoarthritis, seasonal asthma, diverticulosis, ED, GERD, hiatal hernia, hyperlipidemia, history of hydronephrosis, history of overactive bladder, prediabetes, tinnitus, urinary frequency and urgency who is coming to the emergency department with complaints of fever, chills and weakness.  He was admitted with severe cellulitis of LLE.   MDM/Assessment & Plan:   1. Sepsis - secondary to severe cellulitis - continue intensive treatments. Lactate trended down. Hemodynamics much improved. Continue current treatments.    2. Severe nonpurulent Cellulitis of LLE  - continue IV antibiotics. Follow cultures.  No growth to date.  Fevers finally starting to come down.  Continue order set bundle for cellulitis.  3. Pulmonary Edema - from acute diastolic CHF.  IV fluids discontinued,  Lasix IV ordered and he diuresed well.  Echo normal EF with grade 1 DD. He has diuresed 4.5L.  4. Transaminitis - Elevated transaminases noted, will DC pravachol, question if this is from vancomycin hepatotoxicity, will need to monitor closely, if continues to rise, will ask for GI consult.  5. GAD / insomnia - alprazolam ordered.   6. Klebsiella UTI - continue IV antibiotics.  Follow culture and sensitivities.   7. Essential hypertension - stable, continue home meds.   8. Mixed hyperlipidemia - stable on home meds.  9. Thrombocytopenia - Platelets are holding stable for now, will continue to follow.  No bleeding.  10. Hypokalemia - oral replacement ordered.   11. GERD - continue protonix daily.  12. Paroxysmal Atrial Fibrillation - continue metoprolol for rate control and apixaban for anticoagulation.  13. BPH - stable. Monitoring urine output. Pt does in/out caths at home, he has foley in place for  now while on IV lasix.   14. OSA - Pt refusing to use CPAP.   DVT prophylaxis: lovenox Code Status: Full  Family Communication:  Disposition Plan: ongoing inpatient treatment needed with IV antibiotics   Subjective: Pt says that he is having trouble sleeping at night.  His leg pain is getting better.    Objective: Vitals:   10/05/17 0400 10/05/17 0500 10/05/17 0600 10/05/17 0700  BP: 134/73 (!) 141/78 (!) 152/70 (!) 150/75  Pulse: 74 81 71 77  Resp: (!) 28 (!) 25 (!) 25 (!) 25  Temp: 98.8 F (37.1 C) 98.6 F (37 C) 98.8 F (37.1 C) 99 F (37.2 C)  TempSrc:      SpO2: 93% 96% 95% 94%  Weight:  110.4 kg (243 lb 6.2 oz)    Height:        Intake/Output Summary (Last 24 hours) at 10/05/2017 0733 Last data filed at 10/05/2017 0500 Gross per 24 hour  Intake 300 ml  Output 3350 ml  Net -3050 ml   Filed Weights   10/03/17 0500 10/04/17 0400 10/05/17 0500  Weight: 113.4 kg (250 lb) 112.2 kg (247 lb 5.7 oz) 110.4 kg (243 lb 6.2 oz)   REVIEW OF SYSTEMS  As per history otherwise all reviewed and reported negative  Exam:  General exam: awake, alert, NAD, cooperative.  Respiratory system: Clear. No increased work of breathing. Cardiovascular system: S1 & S2 heard, RRR. No JVD, murmurs, gallops, clicks or pedal edema. Gastrointestinal system: Abdomen is nondistended, soft and nontender. Normal bowel sounds heard. Central nervous system: Alert and oriented. No focal neurological deficits. Extremities: improvement  in infection noted:  pronounced edema and erythema LLE slightly improved, hot to touch, elevated LLE, weeping from leg noted.          Data Reviewed: Basic Metabolic Panel: Recent Labs  Lab 10/01/17 0707 10/02/17 0408 10/03/17 0424 10/04/17 0439 10/05/17 0409  NA 139 137 134* 137 142  K 3.8 3.7 3.4* 4.1 4.4  CL 111 105 104 105 109  CO2 24 24 24 25 25   GLUCOSE 99 96 138* 144* 134*  BUN 26* 19 19 20  26*  CREATININE 1.05 0.96 0.70 0.63 0.80  CALCIUM 8.5*  8.2* 8.0* 8.4* 8.8*  MG 1.4* 2.1  --  1.9 2.1  PHOS 3.0  --   --   --   --    Liver Function Tests: Recent Labs  Lab 09/30/17 2259 10/03/17 0424 10/04/17 0439 10/05/17 0409  AST 30 28 75* 164*  ALT 37 28 70* 193*  ALKPHOS 56 43 45 53  BILITOT 0.7 0.9 0.8 0.7  PROT 7.3 6.2* 6.2* 6.4*  ALBUMIN 4.0 2.9* 2.6* 2.7*   No results for input(s): LIPASE, AMYLASE in the last 168 hours. No results for input(s): AMMONIA in the last 168 hours. CBC: Recent Labs  Lab 10/01/17 0707 10/02/17 0408 10/03/17 0424 10/04/17 0439 10/05/17 0409  WBC 10.2 15.0* 14.1* 8.8 8.2  NEUTROABS 9.5* 13.9* 13.0* 7.1 5.8  HGB 13.8 13.8 14.2 13.9 14.4  HCT 42.4 43.2 42.7 43.0 44.3  MCV 90.4 91.7 89.7 89.2 90.6  PLT 134* 133* 113* 128* 147*   Cardiac Enzymes: Recent Labs  Lab 09/30/17 2259  TROPONINI <0.03   CBG (last 3)  No results for input(s): GLUCAP in the last 72 hours. Recent Results (from the past 240 hour(s))  Blood culture (routine x 2)     Status: None   Collection Time: 09/30/17 10:59 PM  Result Value Ref Range Status   Specimen Description BLOOD RIGHT ARM  Final   Special Requests   Final    BOTTLES DRAWN AEROBIC AND ANAEROBIC Blood Culture adequate volume   Culture   Final    NO GROWTH 5 DAYS Performed at [facility], [address] [place], [place] [address]    Report Status 10/05/2017 FINAL  Final  Blood culture (routine x 2)     Status: None   Collection Time: 09/30/17 11:12 PM  Result Value Ref Range Status   Specimen Description BLOOD LEFT ARM  Final   Special Requests   Final    BOTTLES DRAWN AEROBIC AND ANAEROBIC Blood Culture adequate volume   Culture   Final    NO GROWTH 5 DAYS Performed at [facility], [address] [place], [place] [address]    Report Status 10/05/2017 FINAL  Final  Urine culture     Status: Abnormal   Collection Time: 09/30/17 11:27 PM  Result Value Ref Range Status   Specimen Description   Final    URINE, CLEAN CATCH Performed at [facility], [address] [place], [place] [address]    Special Requests   Final    NONE Performed at [facility], [address], [place], [place] [address]    Culture >=100,000 COLONIES/mL KLEBSIELLA PNEUMONIAE (A)  Final   Report Status 10/03/2017 FINAL  Final   Organism ID, Bacteria KLEBSIELLA PNEUMONIAE (A)  Final      Susceptibility   Klebsiella pneumoniae - MIC*    AMPICILLIN RESISTANT Resistant     CEFAZOLIN <=4 SENSITIVE Sensitive     CEFTRIAXONE <=  1 SENSITIVE Sensitive     CIPROFLOXACIN <=0.25 SENSITIVE Sensitive     GENTAMICIN <=1 SENSITIVE Sensitive     IMIPENEM <=0.25 SENSITIVE Sensitive     NITROFURANTOIN 64 INTERMEDIATE Intermediate     TRIMETH/SULFA <=20 SENSITIVE Sensitive     AMPICILLIN/SULBACTAM 4 SENSITIVE Sensitive     PIP/TAZO 16 SENSITIVE Sensitive     Extended ESBL NEGATIVE Sensitive     * >=100,000 COLONIES/mL KLEBSIELLA PNEUMONIAE  MRSA PCR Screening     Status: None   Collection Time: 10/01/17  3:05 AM  Result Value Ref Range Status   MRSA by PCR NEGATIVE NEGATIVE Final    Comment:        The GeneXpert MRSA Assay (FDA approved for NASAL specimens only), is one component of a comprehensive MRSA colonization surveillance program. It is not intended to diagnose MRSA infection nor to guide or monitor treatment for MRSA infections. Performed at [facility], [address] [place], [place] [address]      Studies: Dg Chest [facility] 1 View  Result Date: 10/04/2017 CLINICAL DATA:  Pulmonary edema EXAM: PORTABLE CHEST 1 VIEW COMPARISON:  Yesterday FINDINGS: Normal heart size when accounting for fat pad. Stable vascular pedicle widening. Low volumes with generalized interstitial prominence. No Kerley lines or air bronchogram. No effusion or pneumothorax. IMPRESSION: 1. Stable from yesterday. 2. Low volumes and vascular congestion. Electronically Signed   By: Monte Fantasia M.D.   On: 10/04/2017 08:53    Scheduled Meds: . apixaban  5 mg Oral BID  .  loratadine  10 mg Oral Daily  . Melatonin  6 mg Oral QHS  . metoprolol tartrate  12.5 mg Oral BID  . multivitamin with minerals  1 tablet Oral Daily  . oxybutynin  10 mg Oral Daily  . pantoprazole  40 mg Oral Daily  . potassium chloride  60 mEq Oral BID  . vitamin C  500 mg Oral Daily   Continuous Infusions: . meropenem (MERREM) IV Stopped (10/05/17 0034)  . vancomycin Stopped (10/05/17 8016)    Principal Problem:   Sepsis due to cellulitis [facility]) Active Problems:   Essential hypertension, benign   Mixed hyperlipidemia   Cellulitis of left lower extremity   GERD (gastroesophageal reflux disease)   AF (paroxysmal atrial fibrillation) (HCC)   BPH (benign prostatic hyperplasia)   Urinary tract infection   Sleep apnea   Irwin Brakeman, MD, FAAFP [facility] Pager [phone]  If 7PM-7AM, please contact night-coverage [email or web address] Password [email or web address] 10/05/2017, 7:33 AM    LOS: 4 days

## 2017-10-05 NOTE — Progress Notes (Signed)
Pharmacy Antibiotic Note  Gary Carroll is a 72 y.o. male admitted on 09/30/2017 with cellulitis.  Pharmacy has been consulted for Vancomycin dosing.  Plan: Vancomycin 750mg  IV every 12 hours.  Goal trough 10-15 mcg/mL. Meropenem 1000 mg IV every 8 hours Check Vancomycin trough level on 5/20 F/U cxs and clinical progress Monitor V/S, labs, and levels as indicated  Height: 6' (182.9 cm) Weight: 243 lb 6.2 oz (110.4 kg) IBW/kg (Calculated) : 77.6  Temp (24hrs), Avg:100 F (37.8 C), Min:98.6 F (37 C), Max:100.9 F (38.3 C)  Recent Labs  Lab 09/30/17 2312 10/01/17 0006 10/01/17 0213 10/01/17 0704 10/01/17 0707 10/02/17 0408 10/02/17 0821 10/03/17 0424 10/04/17 0439 10/05/17 0409  WBC  --   --   --   --  10.2 15.0*  --  14.1* 8.8 8.2  CREATININE  --   --   --   --  1.05 0.96  --  0.70 0.63 0.80  LATICACIDVEN 2.0* 2.71* 3.25* 2.9*  --   --  1.7  --   --   --     Estimated Creatinine Clearance: 107.1 mL/min (by C-G formula based on SCr of 0.8 mg/dL).    Allergies  Allergen Reactions  . Diovan [Valsartan] Cough  . Tagamet [Cimetidine] Other (See Comments)    irrittability   Antimicrobials this admission: Vancomycin 5/15 >>  Meropenem 5/16>> Ceftriaxone 2gm IV and zosyn 3.375gm IV x 1 5/15 in ED  Dose adjustments this admission: N/A  Microbiology results: 5/14 BCx: ngtd 5/14 UCx: klebsiella pneumoniae  Thank you for allowing pharmacy to be a part of this patient's care.  Hart Robinsons, PharmD Clinical Pharmacist Pager:  [phone] 10/05/2017   10/05/2017 9:47 AM

## 2017-10-06 ENCOUNTER — Encounter (HOSPITAL_COMMUNITY): Payer: Self-pay | Admitting: Gastroenterology

## 2017-10-06 DIAGNOSIS — R7401 Elevation of levels of liver transaminase levels: Secondary | ICD-10-CM

## 2017-10-06 DIAGNOSIS — R740 Nonspecific elevation of levels of transaminase and lactic acid dehydrogenase [LDH]: Secondary | ICD-10-CM

## 2017-10-06 DIAGNOSIS — A419 Sepsis, unspecified organism: Principal | ICD-10-CM

## 2017-10-06 DIAGNOSIS — L0390 Cellulitis, unspecified: Secondary | ICD-10-CM

## 2017-10-06 LAB — PROTIME-INR
INR: 1.33
PROTHROMBIN TIME: 16.4 s — AB (ref 11.4–15.2)

## 2017-10-06 LAB — CBC WITH DIFFERENTIAL/PLATELET
BASOS ABS: 0 10*3/uL (ref 0.0–0.1)
BASOS PCT: 0 %
EOS ABS: 0.2 10*3/uL (ref 0.0–0.7)
Eosinophils Relative: 3 %
HCT: 44.3 % (ref 39.0–52.0)
HEMOGLOBIN: 14.1 g/dL (ref 13.0–17.0)
Lymphocytes Relative: 14 %
Lymphs Abs: 1.1 10*3/uL (ref 0.7–4.0)
MCH: 28.5 pg (ref 26.0–34.0)
MCHC: 31.8 g/dL (ref 30.0–36.0)
MCV: 89.7 fL (ref 78.0–100.0)
MONO ABS: 0.7 10*3/uL (ref 0.1–1.0)
Monocytes Relative: 9 %
Neutro Abs: 6.2 10*3/uL (ref 1.7–7.7)
Neutrophils Relative %: 74 %
Platelets: 172 10*3/uL (ref 150–400)
RBC: 4.94 MIL/uL (ref 4.22–5.81)
RDW: 14.8 % (ref 11.5–15.5)
WBC: 8.2 10*3/uL (ref 4.0–10.5)

## 2017-10-06 LAB — COMPREHENSIVE METABOLIC PANEL
ALBUMIN: 2.7 g/dL — AB (ref 3.5–5.0)
ALK PHOS: 65 U/L (ref 38–126)
ALT: 258 U/L — ABNORMAL HIGH (ref 17–63)
ANION GAP: 9 (ref 5–15)
AST: 169 U/L — ABNORMAL HIGH (ref 15–41)
BUN: 19 mg/dL (ref 6–20)
CO2: 23 mmol/L (ref 22–32)
Calcium: 8.5 mg/dL — ABNORMAL LOW (ref 8.9–10.3)
Chloride: 106 mmol/L (ref 101–111)
Creatinine, Ser: 0.63 mg/dL (ref 0.61–1.24)
GFR calc Af Amer: >60 mL/min (ref >60)
GFR calc non Af Amer: >60 mL/min (ref >60)
GLUCOSE: 117 mg/dL — AB (ref 65–99)
POTASSIUM: 4.1 mmol/L (ref 3.5–5.1)
SODIUM: 138 mmol/L (ref 135–145)
Total Bilirubin: 0.8 mg/dL (ref 0.3–1.2)
Total Protein: 6.4 g/dL — ABNORMAL LOW (ref 6.5–8.1)

## 2017-10-06 LAB — VANCOMYCIN, TROUGH: Vancomycin Tr: 6 ug/mL — ABNORMAL LOW (ref 15–20)

## 2017-10-06 LAB — MAGNESIUM: Magnesium: 2 mg/dL (ref 1.7–2.4)

## 2017-10-06 MED ORDER — VANCOMYCIN HCL IN DEXTROSE 1-5 GM/200ML-% IV SOLN
1000.0000 mg | Freq: Two times a day (BID) | INTRAVENOUS | Status: DC
  Administered 2017-10-06 – 2017-10-09 (×6): 1000 mg via INTRAVENOUS
  Filled 2017-10-06 (×6): qty 200

## 2017-10-06 NOTE — Progress Notes (Signed)
Pharmacy Antibiotic Note  Gary Carroll is a 72 y.o. male admitted on 09/30/2017 with cellulitis.  Pharmacy has been consulted for Vancomycin dosing.  Plan: Increase Vancomycin to 1000mg  IV every 12 hours.  Goal trough 10-15 mcg/mL. Meropenem 1000 mg IV every 8 hours F/U cxs and clinical progress Monitor V/S, labs, and levels as indicated  Height: 6' (182.9 cm) Weight: 243 lb 6.2 oz (110.4 kg) IBW/kg (Calculated) : 77.6  Temp (24hrs), Avg:98.6 F (37 C), Min:98 F (36.7 C), Max:99.1 F (37.3 C)  Recent Labs  Lab 09/30/17 2312 10/01/17 0006 10/01/17 0213 10/01/17 0704  10/02/17 0408 10/02/17 1610 10/03/17 0424 10/04/17 0439 10/05/17 0409 10/06/17 0554  WBC  --   --   --   --    < > 15.0*  --  14.1* 8.8 8.2 8.2  CREATININE  --   --   --   --    < > 0.96  --  0.70 0.63 0.80 0.63  LATICACIDVEN 2.0* 2.71* 3.25* 2.9*  --   --  1.7  --   --   --   --   VANCOTROUGH  --   --   --   --   --   --   --   --   --   --  6*   < > = values in this interval not displayed.    Estimated Creatinine Clearance: 107.1 mL/min (by C-G formula based on SCr of 0.63 mg/dL).    Allergies  Allergen Reactions  . Diovan [Valsartan] Cough  . Tagamet [Cimetidine] Other (See Comments)    irrittability   Antimicrobials this admission: Vancomycin 5/15 >>  Meropenem 5/16>> Ceftriaxone 2gm IV and zosyn 3.375gm IV x 1 5/15 in ED  Dose adjustments this admission: N/A  Microbiology results: 5/14 BCx: ngtd 5/14 UCx: klebsiella pneumoniae  Thank you for allowing pharmacy to be a part of this patient's care.  Margot Ables, PharmD Clinical Pharmacist 10/06/2017 2:35 PM

## 2017-10-06 NOTE — Care Management Important Message (Signed)
Important Message  Patient Details  Name: OZELL JUHASZ MRN: [id number] Date of Birth: Oct 28, 1945   Medicare Important Message Given:  Yes    Shelda Altes 10/06/2017, 11:30 AM

## 2017-10-06 NOTE — Evaluation (Signed)
Physical Therapy Evaluation Patient Details Name: Gary Carroll MRN: [id number] DOB: 1946-05-16 Today's Date: 10/06/2017   History of Present Illness  Gary Carroll is a 72 y.o. male with medical history significant of allergic rhinitis, osteoarthritis, seasonal asthma, diverticulosis, ED, GERD, hiatal hernia, hyperlipidemia, history of hydronephrosis, history of overactive bladder, prediabetes, tinnitus, urinary frequency and urgency who is coming to the emergency department with complaints of fever, chills and weakness for about an hour.  He mentions mentions that he took 1000 mg of Tylenol and came to the emergency department.  His left lower extremity has become erythematosus, erythematosus, warm and tender.  He denies any known trauma to the area, but occasionally scratches it.  He complains of sinus pressure headache and a left earache for the past 2 days.  He denies sore throat, rhinorrhea, cough, chest pain, dyspnea, palpitations, dizziness, PND orthopnea.  However he gets frequent lower extremity edema.  No abdominal pain, nausea, emesis, diarrhea, melena or hematochezia he occasionally gets constipated.  He also complains of mild dysuria, frequency and difficulty initiating urination.  He has stopped taking Flomax because he states that it makes his urinary symptoms worse.  Denies polyuria, polydipsia or polyphagia.  No heat or cold intolerance.\   Clinical Impression  Patient limited for functional mobility as stated below secondary to BLE weakness, fatigue and poor standing balance.  Patient has most difficulty with sit to stands due to generalized weakness and tolerated sitting up in chair with visitors present after therapy.  Patient will benefit from continued physical therapy in hospital and recommended venue below to increase strength, balance, endurance for safe ADLs and gait.     Follow Up Recommendations SNF;Supervision for mobility/OOB    Equipment Recommendations  None recommended by PT    Recommendations for Other Services       Precautions / Restrictions Precautions Precautions: Fall Restrictions Weight Bearing Restrictions: No      Mobility  Bed Mobility Overal bed mobility: Needs Assistance Bed Mobility: Supine to Sit     Supine to sit: Min assist     General bed mobility comments: slow labored movement  Transfers Overall transfer level: Needs assistance Equipment used: Rolling walker (2 wheeled) Transfers: Sit to/from [facility] Sit to Stand: Min assist Stand pivot transfers: Min guard       General transfer comment: slow labored movement  Ambulation/Gait Ambulation/Gait assistance: Min guard Ambulation Distance (Feet): 35 Feet Assistive device: Rolling walker (2 wheeled) Gait Pattern/deviations: Decreased step length - right;Decreased step length - left;Decreased stride length Gait velocity: slow   General Gait Details: slow unsteady cadence, no loss of balance, limited mostly due to c/o fatigue/LLE discomfort  Stairs            Wheelchair Mobility    Modified Rankin (Stroke Patients Only)       Balance Overall balance assessment: Needs assistance Sitting-balance support: Feet supported;No upper extremity supported Sitting balance-Leahy Scale: Good     Standing balance support: Bilateral upper extremity supported;During functional activity Standing balance-Leahy Scale: Fair                               Pertinent Vitals/Pain Pain Assessment: No/denies pain    Home Living Family/patient expects to be discharged to:: Private residence Living Arrangements: Alone Available Help at Discharge: Neighbor Type of Home: Apartment Home Access: Stairs to enter Entrance Stairs-Rails: Right Entrance Stairs-Number of Steps: 4  Home Layout: One level  Home Equipment: [place] - 2 wheels;Cane - single point;Bedside commode;Shower seat      Prior Function Level of Independence:  Independent with assistive device(s)         Comments: has to use the RW first thing in the morning due to stiffness but once he gets moving he can ambulate without AD; short distanced community with SPC, drives     Hand Dominance   Dominant Hand: Right    Extremity/Trunk Assessment   Upper Extremity Assessment Upper Extremity Assessment: Generalized weakness    Lower Extremity Assessment Lower Extremity Assessment: Generalized weakness    Cervical / Trunk Assessment Cervical / Trunk Assessment: Normal  Communication   Communication: No difficulties  Cognition Arousal/Alertness: Awake/alert Behavior During Therapy: WFL for tasks assessed/performed Overall Cognitive Status: Within Functional Limits for tasks assessed                                        General Comments      Exercises     Assessment/Plan    PT Assessment Patient needs continued PT services  PT Problem List Decreased strength;Decreased activity tolerance;Decreased balance;Decreased mobility       PT Treatment Interventions Gait training;Functional mobility training;Stair training;Therapeutic activities;Therapeutic exercise;Patient/family education    PT Goals (Current goals can be found in the Care Plan section)  Acute Rehab PT Goals Patient Stated Goal: return home  PT Goal Formulation: With patient Time For Goal Achievement: 10/20/17 Potential to Achieve Goals: Good    Frequency Min 3X/week   Barriers to discharge        Co-evaluation               AM-PAC PT "6 Clicks" Daily Activity  Outcome Measure Difficulty turning over in bed (including adjusting bedclothes, sheets and blankets)?: None Difficulty moving from lying on back to sitting on the side of the bed? : A Little Difficulty sitting down on and standing up from a chair with arms (e.g., wheelchair, bedside commode, etc,.)?: A Little Help needed moving to and from a bed to chair (including a  wheelchair)?: A Little Help needed walking in hospital room?: A Little Help needed climbing 3-5 steps with a railing? : A Lot 6 Click Score: 18    End of Session Equipment Utilized During Treatment: Gait belt Activity Tolerance: Patient tolerated treatment well;Patient limited by fatigue Patient left: in chair;with call bell/phone within reach;with chair alarm set;with family/visitor present Nurse Communication: Mobility status PT Visit Diagnosis: Unsteadiness on feet (R26.81);Other abnormalities of gait and mobility (R26.89);Muscle weakness (generalized) (M62.81)    Time: 9163-8466 PT Time Calculation (min) (ACUTE ONLY): 26 min   Charges:   PT Evaluation $PT Eval Moderate Complexity: 1 Mod PT Treatments $Therapeutic Activity: 23-37 mins   PT G Codes:        2:23 PM, Oct 12, 2017 Lonell Grandchild, MPT Physical Therapist with [facility] [phone] office [phone] mobile phone

## 2017-10-06 NOTE — Progress Notes (Signed)
Pt's Foley removed at 5 PM 10/06/17. Has not voided yet. Bladder scanned and it showed only 91 ml in his bladder at this time.

## 2017-10-06 NOTE — Plan of Care (Signed)
  Problem: Acute Rehab PT Goals(only PT should resolve) Goal: Pt Will Go Supine/Side To Sit Outcome: Progressing Flowsheets (Taken 10/06/2017 1426) Pt will go Supine/Side to Sit: with modified independence Goal: Patient Will Transfer Sit To/From Stand Outcome: Progressing Flowsheets (Taken 10/06/2017 1426) Patient will transfer sit to/from stand: with supervision Goal: Pt Will Transfer Bed To Chair/Chair To Bed Outcome: Progressing Flowsheets (Taken 10/06/2017 1426) Pt will Transfer Bed to Chair/Chair to Bed: with supervision Goal: Pt Will Ambulate Outcome: Progressing Flowsheets (Taken 10/06/2017 1426) Pt will Ambulate: 50 feet;with supervision;with rolling walker  2:26 PM, 10/06/17 Lonell Grandchild, MPT Physical Therapist with [facility] [phone] office [phone] mobile phone

## 2017-10-06 NOTE — Consult Note (Signed)
Referring Provider: Dr. Wynetta Emery  Primary Care Physician:  Celene Squibb, MD Primary Gastroenterologist:  Dr. Deatra Ina   Date of Admission: 09/30/17 Date of Consultation: 10/06/17  Reason for Consultation: elevated LFTs   HPI:  Gary Carroll is a 72 y.o. year old male who presented to the ED on 09/30/17 with fever (102.2), chills, weakness, admitted with sepsis secondary to severe cellulitis of left lower extremity. He has also been treated for pulmonary edema, acute diastolic heart failure, UTI. New onset elevated transaminases noted on 10/04/17, increasing. GI consult requested.   No N/V, abdominal pain. No prior history of elevated LFTs that he knows. Fish oil, vitamins OTC. Last ETOH intake 2 years ago. No smoking in over 2 years. Prior to this would drink a glass of wine or beer daily after work. Takes Tylenol prn. Prior to admission had taken 1000 mg Tylenol. Had been taking tylenol every other day. No NSAIDs. States "I have knees that don't like me". Usually would take 1000 mg Tylenol every other day. No prior history of blood transfusions. No drug use. No tattoos. About a year ago started having issues with pill dysphagia. Occasional choking on solid foods. Believes he had an EGD in remote past, possibly in [place]. Not sure who performed this. Voice is hoarse, started while in hospital.   New onset elevated transaminases on 10/04/17 with AST 75, ALT 70. Yesterday more than double to AST 164, ALT 193. Today, AST stable, ALT slightly increased. No increase in bilirubin or alk phos. US abdomen complete with CBD 6 mm, normal liver, patent portal vein. Noted to have pulmonary edema on 5/16, 5/17. Last chest xray 5/18 stable with vascular congestion. ECHO 10/04/17 with normal systolic function, EF 21-11%, abnormal left ventricular relaxation, Grade 1 diastolic dysfunction. He was prescribed IV lasix and has diuresed. Last dose of Pravachol on 10/04/17. Thrombocytopenia has only been noted during  hospitalization for acute illness.      Past Medical History:  Diagnosis Date  . Allergic rhinitis   . Arthritis   . Asthma    seasonal per pt  . Bilateral knee pain    OA  . Diverticulosis 02/2012   seen on colonoscopy  . Erectile dysfunction   . GERD (gastroesophageal reflux disease)   . Heart murmur   . Hiatal hernia   . HLD (hyperlipidemia)   . HTN (hypertension)   . Hydronephrosis determined by ultrasound 08/22/2015  . Overactive bladder   . Prediabetes   . Ringing of ears   . Sleep apnea   . Urinary frequency   . Urinary urgency   . Wears glasses     Past Surgical History:  Procedure Laterality Date  . CHOLECYSTECTOMY    . COLONOSCOPY  02/2012   Dr. Deatra Ina: sigmoid, transverse, and ascending colon diverticulosis  . FINGER SURGERY Right   . TONSILLECTOMY    . 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 Lorre Nick)  . TOTAL KNEE ARTHROPLASTY Right 06/19/2015   Procedure: TOTAL KNEE ARTHROPLASTY;  Surgeon: Vickey Huger, MD;  Location: [facility];  Service: Orthopedics;  Laterality: Right;    Prior to Admission medications   Medication Sig Start Date End Date Taking? Authorizing Provider  apixaban (ELIQUIS) 5 MG TABS tablet Take 1 tablet (5 mg total) by mouth 2 (two) times daily. 08/24/15  Yes Isaac Bliss, Rayford Halsted, MD  diphenhydrAMINE (BENADRYL) 25 MG tablet Take 50 mg by mouth at bedtime as needed for sleep.   Yes [provider]  loratadine (CLARITIN) 10 MG tablet Take 10 mg by mouth daily.   Yes [provider]  Melatonin 5 MG TABS Take 5 mg by mouth at bedtime.   Yes [provider]  metoprolol tartrate (LOPRESSOR) 25 MG tablet Take 1 tablet (25 mg total) by mouth 2 (two) times daily. 08/24/15  Yes Isaac Bliss, Rayford Halsted, MD  Multiple Vitamins-Minerals (MULTIVITAL) tablet Take 1 tablet by mouth daily.     Yes [provider]  Omega-3 Fatty Acids (FISH OIL) 1000 MG CAPS Take 4 capsules by mouth daily.    Yes [provider]   oxybutynin (DITROPAN-XL) 10 MG 24 hr tablet Take 10 mg by mouth daily.   Yes [provider]  pantoprazole (PROTONIX) 40 MG tablet Take 1 tablet (40 mg total) by mouth daily. 02/10/17  Yes Kathie Dike, MD  pravastatin (PRAVACHOL) 40 MG tablet Take 1 tablet (40 mg total) by mouth daily. 09/16/12  Yes Rita Ohara, MD  ranitidine (ZANTAC) 150 MG tablet Take 150 mg by mouth at bedtime as needed for heartburn.   Yes [provider]  sodium chloride (OCEAN) 0.65 % SOLN nasal spray Place 1 spray into both nostrils as needed for congestion.   Yes [provider]  vitamin C (ASCORBIC ACID) 500 MG tablet Take 500 mg by mouth daily.   Yes [provider]    Current Facility-Administered Medications  Medication Dose Route Frequency Provider Last Rate Last Dose  . acetaminophen (TYLENOL) tablet 650 mg  650 mg Oral Q6H PRN Reubin Milan, MD   650 mg at 10/06/17 0703   Or  . acetaminophen (TYLENOL) suppository 650 mg  650 mg Rectal Q6H PRN Reubin Milan, MD   650 mg at 10/02/17 1900  . ALPRAZolam Duanne Moron) tablet 0.25-0.5 mg  0.25-0.5 mg Oral TID PRN Wynetta Emery, [name], MD   0.25 mg at 10/05/17 2150  . apixaban (ELIQUIS) tablet 5 mg  5 mg Oral BID Reubin Milan, MD   5 mg at 10/06/17 4818  . famotidine (PEPCID) tablet 20 mg  20 mg Oral BID BM & HS PRN Reubin Milan, MD      . hydrocortisone cream 1 %   Topical PRN Johnson, [name], MD      . meropenem (MERREM) 1 g in sodium chloride 0.9 % 100 mL IVPB  1 g Intravenous Q8H Johnson, [name], MD 200 mL/hr at 10/06/17 0905 1 g at 10/06/17 0905  . metoprolol tartrate (LOPRESSOR) tablet 12.5 mg  12.5 mg Oral BID Johnson, [name], MD   12.5 mg at 10/06/17 0909  . multivitamin with minerals tablet 1 tablet  1 tablet Oral Daily Reubin Milan, MD   1 tablet at 10/06/17 0908  . ondansetron (ZOFRAN) tablet 4 mg  4 mg Oral Q6H PRN Reubin Milan, MD       Or  . ondansetron [facility]) injection  4 mg  4 mg Intravenous Q6H PRN Reubin Milan, MD      . oxybutynin (DITROPAN-XL) 24 hr tablet 10 mg  10 mg Oral Daily Reubin Milan, MD   10 mg at 10/06/17 0908  . pantoprazole (PROTONIX) EC tablet 40 mg  40 mg Oral Daily Reubin Milan, MD   40 mg at 10/06/17 5631  . senna-docusate (Senokot-S) tablet 2 tablet  2 tablet Oral BID Murlean Iba, MD   2 tablet at 10/06/17 0908  . sodium chloride (OCEAN) 0.65 % nasal  spray 1 spray  1 spray Each Nare PRN Reubin Milan, MD        Allergies as of 09/30/2017 - Review Complete 09/30/2017  Allergen Reaction Noted  . Diovan [valsartan] Cough 12/20/2010  . Tagamet [cimetidine] Other (See Comments)     Family History  Problem Relation Age of Onset  . Uterine cancer Mother   . Cancer Mother        uterine  . Coronary artery disease Father   . Coronary artery disease Paternal Grandfather   . Heart disease Paternal Grandfather   . Colon cancer Neg Hx   . Esophageal cancer Neg Hx   . Stomach cancer Neg Hx   . Rectal cancer Neg Hx     Social History   Socioeconomic History  . Marital status: Divorced    Spouse name: Not on file  . Number of children: 0  . Years of education: Not on file  . Highest education level: Not on file  Occupational History  . Occupation: retired Land)    Employer: RETIRED  Social Needs  . Financial resource strain: Not on file  . Food insecurity:    Worry: Not on file    Inability: Not on file  . Transportation needs:    Medical: Not on file    Non-medical: Not on file  Tobacco Use  . Smoking status: Former Smoker    Types: Cigarettes    Last attempt to quit: 01/18/2010    Years since quitting: 7.7  . Smokeless tobacco: Never Used  . Tobacco comment: Smoked for 25 years- quit September 2011.  Passive exposure from wife  Substance and Sexual Activity  . Alcohol use: Yes    Alcohol/week: 0.6 - 1.2 oz    Types: 1 - 2 Glasses of wine per week    Comment: Stopped drinking  1-2 glasses of wine daily  . Drug use: No  . Sexual activity: Not Currently  Lifestyle  . Physical activity:    Days per week: Not on file    Minutes per session: Not on file  . Stress: Not on file  Relationships  . Social connections:    Talks on phone: Not on file    Gets together: Not on file    Attends religious service: Not on file    Active member of club or organization: Not on file    Attends meetings of clubs or organizations: Not on file    Relationship status: Not on file  . Intimate partner violence:    Fear of current or ex partner: Not on file    Emotionally abused: Not on file    Physically abused: Not on file    Forced sexual activity: Not on file  Other Topics Concern  . Not on file  Social History Narrative   Separated from wife (2014) and moved to [place]. Retired- worked in Theatre manager.     Review of Systems: Gen: see HPI  CV: Denies chest pain, heart palpitations, syncope, edema  Resp: Denies shortness of breath with rest, cough, wheezing GI: see HPI  GU : Denies urinary burning, urinary frequency, urinary incontinence.  MS: see HPI  Derm: see HPI  Psych: Denies depression, anxiety,confusion, or memory loss Heme: Denies bruising, bleeding, and enlarged lymph nodes.  Physical Exam: Vital signs in last 24 hours: Temp:  [98.3 F (36.8 C)-99.1 F (37.3 C)] 98.3 F (36.8 C) (05/20 0452) Pulse Rate:  [72-84] 75 (05/20 0452) Resp:  [20-29] 20 (05/20 0452) BP: (  126-151)/(63-95) 148/68 (05/20 0452) SpO2:  [93 %-96 %] 95 % (05/20 0452) Last BM Date: 10/01/17 General:   Alert,  Well-developed, well-nourished, pleasant and cooperative in NAD Head:  Normocephalic and atraumatic. Eyes:  Sclera clear, no icterus.   Conjunctiva pink. Ears:  Normal auditory acuity. Nose:  No deformity, discharge,  or lesions. Mouth:  No deformity or lesions, dentition normal. Lungs:  Clear throughout to auscultation.   Scattered rhonchi Heart:  S 1 S2 present, irregular  irregular, systolic murmur appreciated Abdomen:  Soft, obese, nontender and nondistended. No masses, hepatosplenomegaly or hernias noted. Normal bowel sounds, without guarding, and without rebound.   Rectal:  Deferred Extremities:  With 2+ edema LLE, cellulitis to LLE, RLE with trace edema Neurologic:  Alert and  oriented x4;  grossly normal neurologically. Psych:  Alert and cooperative. Normal mood and affect.  Intake/Output from previous day: 05/19 0701 - 05/20 0700 In: 300 [IV Piggyback:300] Out: 1200 [Urine:1200] Intake/Output this shift: No intake/output data recorded.  Lab Results: Recent Labs    10/04/17 0439 10/05/17 0409 10/06/17 0554  WBC 8.8 8.2 8.2  HGB 13.9 14.4 14.1  HCT 43.0 44.3 44.3  PLT 128* 147* 172   BMET Recent Labs    10/04/17 0439 10/05/17 0409 10/06/17 0554  NA 137 142 138  K 4.1 4.4 4.1  CL 105 109 106  CO2 25 25 23   GLUCOSE 144* 134* 117*  BUN 20 26* 19  CREATININE 0.63 0.80 0.63  CALCIUM 8.4* 8.8* 8.5*   LFT Recent Labs    10/04/17 0439 10/05/17 0409 10/06/17 0554  PROT 6.2* 6.4* 6.4*  ALBUMIN 2.6* 2.7* 2.7*  AST 75* 164* 169*  ALT 70* 193* 258*  ALKPHOS 45 53 65  BILITOT 0.8 0.7 0.8    Studies/Results: US Abdomen Complete  Result Date: 10/05/2017 CLINICAL DATA:  Patient with elevated LFTs. EXAM: ABDOMEN ULTRASOUND COMPLETE COMPARISON:  Abdominal ultrasound 08/21/2015 FINDINGS: Gallbladder: Surgically absent Common bile duct: Diameter: 6 mm Liver: No focal lesion identified. Within normal limits in parenchymal echogenicity. Portal vein is patent on color Doppler imaging with normal direction of blood flow towards the liver. IVC: No abnormality visualized. Pancreas: Not visualized. Spleen: Size and appearance within normal limits. Right Kidney: Length: 11.2 cm. Echogenicity within normal limits. No mass or hydronephrosis visualized. Left Kidney: Length: 13.2 cm. Echogenicity within normal limits. No mass or hydronephrosis visualized.  Abdominal aorta: Not visualized. Other findings: None. IMPRESSION: No acute process. Electronically Signed   By: Lovey Newcomer M.D.   On: 10/05/2017 11:40    Impression: 72 year old male admitted with sepsis in the setting of lower extremity cellulitis and UTI, improving clinically since admission. Found to have pulmonary edema in setting of acute diastolic heart failure and improving from this as well. LFTs normal on admission and remained normal for several days, with new onset elevated transaminases 2X upper limits of normal noted 10/04/17. Increasing transaminases over past 48 hours, with AST remaining overall stable from yesterday and ALT increased. [place] without any acute process. Patient is asymptomatic. He has no known prior history of liver disease, no history of elevated LFTs after review of epic. Thrombocytopenia noted during acute hospitalization, otherwise with normal platelets when reviewing trends.  Elevated transaminases multifactorial in setting of acute illness, heart failure, antibiotics, history of statin use that has been discontinued this admission with last dose 10/04/17. Discussed with pharmacy as well: unlikely that vancomycin contributing but appears this has been discontinued. Meropenem with elevated LFTs in 1/% of population.  Agree with discontinuation of statin. Will check hepatitis panel and repeat HFP in the morning. If continues to trend up, would obtain further serologies.   Dysphagia: solid food and pill dysphagia for one year. Tolerating diet. Possible EGD in remote past. Has been seen by [facility] in the past but desires further outpatient GI evaluation in [place] if possible. Increase PPI to BID due to GERD exacerbation.    Plan: Check hepatitis panel Recheck HFP in am, further serologies as appropriate INR for baseline  PPI BID Agree with statin cessation Further evaluation of upper GI chronic issues as outpatient unless worsens while inpatient: no acute need for  endoscopic intervention Will continue to follow with you   Annitta Needs, PhD, ANP-BC [facility]       LOS: 5 days    10/06/2017, 9:43 AM

## 2017-10-06 NOTE — Progress Notes (Signed)
PROGRESS NOTE    Gary Carroll  XTK:[id number]  DOB: 01-27-1946  DOA: 09/30/2017 PCP: Celene Squibb, MD   Brief Admission Hx: Gary Carroll is a 72 y.o. male with medical history significant of allergic rhinitis, osteoarthritis, seasonal asthma, diverticulosis, ED, GERD, hiatal hernia, hyperlipidemia, history of hydronephrosis, history of overactive bladder, prediabetes, tinnitus, urinary frequency and urgency who is coming to the emergency department with complaints of fever, chills and weakness.  He was admitted with severe cellulitis of LLE.   MDM/Assessment & Plan:   1. Sepsis - resolved now. Secondary to severe cellulitis LLE - continue intensive treatments. Lactate trended down. Hemodynamics much improved. Continue current treatments.    2. Severe nonpurulent Cellulitis of LLE  - I am seeing noticeable improvements today.  continue IV antibiotics. Follow cultures.  No growth to date.  Fevers finally starting to come down.  Continue order set bundle for cellulitis.  3. Pulmonary Edema - from acute diastolic CHF.  IV fluids discontinued,  Lasix IV ordered and he diuresed well.  Echo normal EF with grade 1 DD. He has diuresed 5.5L.  4. Transaminitis - Elevated transaminases noted,  DC'd pravachol, I have tried to reduce as many meds as possible that could contribute to rising LFTs.  I question if this is from vancomycin hepatotoxicity, will need to monitor closely, because ALT continues to rise, I am requesting GI consult. [place] of liver did not show any abnormalities.  Continue vanc for now as his cellulitis is finally starting to improve.  5. GAD / insomnia - anxiolytics ordered.   6. Klebsiella UTI - continue IV antibiotics.  I have reviewed culture and sensitivities.   7. Essential hypertension - stable, continue home meds.   8. Mixed hyperlipidemia - stable on home meds.  9. Thrombocytopenia - Platelets are holding stable for now, will continue to follow.  No bleeding.   10. Hypokalemia - oral replacement ordered.   11. GERD - continue protonix daily.  12. Paroxysmal Atrial Fibrillation - continue metoprolol for rate control and apixaban for anticoagulation.  13. BPH - stable. Monitoring urine output. Pt does in/out caths at home, he has foley in place for now while on IV lasix.   14. OSA - Pt refusing to use CPAP.   DVT prophylaxis: lovenox Code Status: Full  Family Communication:  Disposition Plan: ongoing inpatient treatment needed with IV antibiotics   Subjective: Pt says his leg is getting better, he is anxious to get home, I explained that while he is improving, He needs more IV antibiotic treatment today.  He verbalized understanding.   Objective: Vitals:   10/05/17 1700 10/05/17 2036 10/05/17 2151 10/06/17 0452  BP: 126/88  (!) 151/65 (!) 148/68  Pulse: 75  80 75  Resp: (!) 22  20 20   Temp: 99.1 F (37.3 C)  98.5 F (36.9 C) 98.3 F (36.8 C)  TempSrc:   Oral Oral  SpO2: 95% 93% 96% 95%  Weight:      Height:        Intake/Output Summary (Last 24 hours) at 10/06/2017 0843 Last data filed at 10/06/2017 9924 Gross per 24 hour  Intake 300 ml  Output 1200 ml  Net -900 ml   Filed Weights   10/03/17 0500 10/04/17 0400 10/05/17 0500  Weight: 113.4 kg (250 lb) 112.2 kg (247 lb 5.7 oz) 110.4 kg (243 lb 6.2 oz)   REVIEW OF SYSTEMS  As per history otherwise all reviewed and reported negative  Exam:  General  exam: awake, alert, NAD, cooperative.  Respiratory system: Clear. No increased work of breathing. Cardiovascular system: S1 & S2 heard, RRR. No JVD, murmurs, gallops, clicks or pedal edema. Gastrointestinal system: Abdomen is nondistended, soft and nontender. Normal bowel sounds heard. Central nervous system: Alert and oriented. No focal neurological deficits. Extremities: improvement in infection noted: slightly improved edema and erythema LLE slightly improved, warm to touch, dead skin noted, elevated LLE.         Data  Reviewed: Basic Metabolic Panel: Recent Labs  Lab 10/01/17 0707 10/02/17 0408 10/03/17 0424 10/04/17 0439 10/05/17 0409 10/06/17 0554  NA 139 137 134* 137 142 138  K 3.8 3.7 3.4* 4.1 4.4 4.1  CL 111 105 104 105 109 106  CO2 24 24 24 25 25 23   GLUCOSE 99 96 138* 144* 134* 117*  BUN 26* 19 19 20  26* 19  CREATININE 1.05 0.96 0.70 0.63 0.80 0.63  CALCIUM 8.5* 8.2* 8.0* 8.4* 8.8* 8.5*  MG 1.4* 2.1  --  1.9 2.1 2.0  PHOS 3.0  --   --   --   --   --    Liver Function Tests: Recent Labs  Lab 09/30/17 2259 10/03/17 0424 10/04/17 0439 10/05/17 0409 10/06/17 0554  AST 30 28 75* 164* 169*  ALT 37 28 70* 193* 258*  ALKPHOS 56 43 45 53 65  BILITOT 0.7 0.9 0.8 0.7 0.8  PROT 7.3 6.2* 6.2* 6.4* 6.4*  ALBUMIN 4.0 2.9* 2.6* 2.7* 2.7*   No results for input(s): LIPASE, AMYLASE in the last 168 hours. No results for input(s): AMMONIA in the last 168 hours. CBC: Recent Labs  Lab 10/02/17 0408 10/03/17 0424 10/04/17 0439 10/05/17 0409 10/06/17 0554  WBC 15.0* 14.1* 8.8 8.2 8.2  NEUTROABS 13.9* 13.0* 7.1 5.8 6.2  HGB 13.8 14.2 13.9 14.4 14.1  HCT 43.2 42.7 43.0 44.3 44.3  MCV 91.7 89.7 89.2 90.6 89.7  PLT 133* 113* 128* 147* 172   Cardiac Enzymes: Recent Labs  Lab 09/30/17 2259  TROPONINI <0.03   CBG (last 3)  No results for input(s): GLUCAP in the last 72 hours. Recent Results (from the past 240 hour(s))  Blood culture (routine x 2)     Status: None   Collection Time: 09/30/17 10:59 PM  Result Value Ref Range Status   Specimen Description BLOOD RIGHT ARM  Final   Special Requests   Final    BOTTLES DRAWN AEROBIC AND ANAEROBIC Blood Culture adequate volume   Culture   Final    NO GROWTH 5 DAYS Performed at [facility], [address], [place], [place] [address]    Report Status 10/05/2017 FINAL  Final  Blood culture (routine x 2)     Status: None   Collection Time: 09/30/17 11:12 PM  Result Value Ref Range Status   Specimen Description BLOOD LEFT ARM  Final    Special Requests   Final    BOTTLES DRAWN AEROBIC AND ANAEROBIC Blood Culture adequate volume   Culture   Final    NO GROWTH 5 DAYS Performed at [facility], [address] [place], [place] [address]    Report Status 10/05/2017 FINAL  Final  Urine culture     Status: Abnormal   Collection Time: 09/30/17 11:27 PM  Result Value Ref Range Status   Specimen Description   Final    URINE, CLEAN CATCH Performed at [facility], [address] [place], [place] [address]    Special Requests   Final  NONE Performed at [facility], [address], [place], [place] [address]    Culture >=100,000 COLONIES/mL KLEBSIELLA PNEUMONIAE (A)  Final   Report Status 10/03/2017 FINAL  Final   Organism ID, Bacteria KLEBSIELLA PNEUMONIAE (A)  Final      Susceptibility   Klebsiella pneumoniae - MIC*    AMPICILLIN RESISTANT Resistant     CEFAZOLIN <=4 SENSITIVE Sensitive     CEFTRIAXONE <=1 SENSITIVE Sensitive     CIPROFLOXACIN <=0.25 SENSITIVE Sensitive     GENTAMICIN <=1 SENSITIVE Sensitive     IMIPENEM <=0.25 SENSITIVE Sensitive     NITROFURANTOIN 64 INTERMEDIATE Intermediate     TRIMETH/SULFA <=20 SENSITIVE Sensitive     AMPICILLIN/SULBACTAM 4 SENSITIVE Sensitive     PIP/TAZO 16 SENSITIVE Sensitive     Extended ESBL NEGATIVE Sensitive     * >=100,000 COLONIES/mL KLEBSIELLA PNEUMONIAE  MRSA PCR Screening     Status: None   Collection Time: 10/01/17  3:05 AM  Result Value Ref Range Status   MRSA by PCR NEGATIVE NEGATIVE Final    Comment:        The GeneXpert MRSA Assay (FDA approved for NASAL specimens only), is one component of a comprehensive MRSA colonization surveillance program. It is not intended to diagnose MRSA infection nor to guide or monitor treatment for MRSA infections. Performed at [facility], [address], [place], [place] [address]      Studies: US Abdomen Complete  Result Date: 10/05/2017 CLINICAL DATA:  Patient with elevated LFTs. EXAM: ABDOMEN ULTRASOUND  COMPLETE COMPARISON:  Abdominal ultrasound 08/21/2015 FINDINGS: Gallbladder: Surgically absent Common bile duct: Diameter: 6 mm Liver: No focal lesion identified. Within normal limits in parenchymal echogenicity. Portal vein is patent on color Doppler imaging with normal direction of blood flow towards the liver. IVC: No abnormality visualized. Pancreas: Not visualized. Spleen: Size and appearance within normal limits. Right Kidney: Length: 11.2 cm. Echogenicity within normal limits. No mass or hydronephrosis visualized. Left Kidney: Length: 13.2 cm. Echogenicity within normal limits. No mass or hydronephrosis visualized. Abdominal aorta: Not visualized. Other findings: None. IMPRESSION: No acute process. Electronically Signed   By: Lovey Newcomer M.D.   On: 10/05/2017 11:40    Scheduled Meds: . apixaban  5 mg Oral BID  . metoprolol tartrate  12.5 mg Oral BID  . multivitamin with minerals  1 tablet Oral Daily  . oxybutynin  10 mg Oral Daily  . pantoprazole  40 mg Oral Daily  . senna-docusate  2 tablet Oral BID   Continuous Infusions: . meropenem (MERREM) IV Stopped (10/06/17 0048)    Principal Problem:   Sepsis due to cellulitis [facility]) Active Problems:   Essential hypertension, benign   Mixed hyperlipidemia   Cellulitis of left lower extremity   GERD (gastroesophageal reflux disease)   AF (paroxysmal atrial fibrillation) (HCC)   BPH (benign prostatic hyperplasia)   Urinary tract infection   Sleep apnea   Irwin Brakeman, MD, FAAFP [facility] Pager [phone]  If 7PM-7AM, please contact night-coverage [email or web address] Password [email or web address] 10/06/2017, 8:43 AM    LOS: 5 days

## 2017-10-07 ENCOUNTER — Encounter (HOSPITAL_COMMUNITY): Payer: Self-pay | Admitting: Gastroenterology

## 2017-10-07 DIAGNOSIS — R6520 Severe sepsis without septic shock: Secondary | ICD-10-CM

## 2017-10-07 DIAGNOSIS — L03116 Cellulitis of left lower limb: Secondary | ICD-10-CM

## 2017-10-07 DIAGNOSIS — N400 Enlarged prostate without lower urinary tract symptoms: Secondary | ICD-10-CM

## 2017-10-07 DIAGNOSIS — I10 Essential (primary) hypertension: Secondary | ICD-10-CM

## 2017-10-07 DIAGNOSIS — N390 Urinary tract infection, site not specified: Secondary | ICD-10-CM

## 2017-10-07 LAB — COMPREHENSIVE METABOLIC PANEL
ALBUMIN: 2.7 g/dL — AB (ref 3.5–5.0)
ALT: 216 U/L — ABNORMAL HIGH (ref 17–63)
ANION GAP: 7 (ref 5–15)
AST: 100 U/L — AB (ref 15–41)
Alkaline Phosphatase: 65 U/L (ref 38–126)
BUN: 16 mg/dL (ref 6–20)
CO2: 26 mmol/L (ref 22–32)
Calcium: 8.5 mg/dL — ABNORMAL LOW (ref 8.9–10.3)
Chloride: 104 mmol/L (ref 101–111)
Creatinine, Ser: 0.62 mg/dL (ref 0.61–1.24)
GFR calc non Af Amer: >60 mL/min (ref >60)
GLUCOSE: 117 mg/dL — AB (ref 65–99)
POTASSIUM: 3.8 mmol/L (ref 3.5–5.1)
Sodium: 137 mmol/L (ref 135–145)
Total Bilirubin: 0.9 mg/dL (ref 0.3–1.2)
Total Protein: 6.2 g/dL — ABNORMAL LOW (ref 6.5–8.1)

## 2017-10-07 LAB — CBC WITH DIFFERENTIAL/PLATELET
BASOS ABS: 0 10*3/uL (ref 0.0–0.1)
Basophils Relative: 0 %
Eosinophils Absolute: 0.3 10*3/uL (ref 0.0–0.7)
Eosinophils Relative: 4 %
HEMATOCRIT: 42.9 % (ref 39.0–52.0)
HEMOGLOBIN: 14 g/dL (ref 13.0–17.0)
LYMPHS PCT: 21 %
Lymphs Abs: 1.5 10*3/uL (ref 0.7–4.0)
MCH: 29.1 pg (ref 26.0–34.0)
MCHC: 32.6 g/dL (ref 30.0–36.0)
MCV: 89.2 fL (ref 78.0–100.0)
MONOS PCT: 11 %
Monocytes Absolute: 0.8 10*3/uL (ref 0.1–1.0)
NEUTROS ABS: 4.6 10*3/uL (ref 1.7–7.7)
Neutrophils Relative %: 64 %
Platelets: 190 10*3/uL (ref 150–400)
RBC: 4.81 MIL/uL (ref 4.22–5.81)
RDW: 14.6 % (ref 11.5–15.5)
WBC: 7.2 10*3/uL (ref 4.0–10.5)

## 2017-10-07 LAB — HEPATITIS PANEL, ACUTE
HCV Ab: <0.1 s/co ratio (ref 0.0–0.9)
HEP B C IGM: NEGATIVE
Hep A IgM: NEGATIVE
Hepatitis B Surface Ag: NEGATIVE

## 2017-10-07 LAB — MAGNESIUM: Magnesium: 1.9 mg/dL (ref 1.7–2.4)

## 2017-10-07 MED ORDER — FUROSEMIDE 10 MG/ML IJ SOLN
40.0000 mg | Freq: Once | INTRAMUSCULAR | Status: AC
  Administered 2017-10-07: 40 mg via INTRAVENOUS
  Filled 2017-10-07: qty 4

## 2017-10-07 MED ORDER — PANTOPRAZOLE SODIUM 40 MG PO TBEC
40.0000 mg | DELAYED_RELEASE_TABLET | Freq: Two times a day (BID) | ORAL | Status: DC
  Administered 2017-10-07 – 2017-10-09 (×5): 40 mg via ORAL
  Filled 2017-10-07 (×4): qty 1

## 2017-10-07 NOTE — Progress Notes (Signed)
Physical Therapy Treatment Patient Details Name: Gary Carroll MRN: [id number] DOB: 12-29-1945 Today's Date: 10/07/2017    History of Present Illness Gary Carroll is a 72 y.o. male with medical history significant of allergic rhinitis, osteoarthritis, seasonal asthma, diverticulosis, ED, GERD, hiatal hernia, hyperlipidemia, history of hydronephrosis, history of overactive bladder, prediabetes, tinnitus, urinary frequency and urgency who is coming to the emergency department with complaints of fever, chills and weakness for about an hour.  He mentions mentions that he took 1000 mg of Tylenol and came to the emergency department.  His left lower extremity has become erythematosus, erythematosus, warm and tender.  He denies any known trauma to the area, but occasionally scratches it.  He complains of sinus pressure headache and a left earache for the past 2 days.  He denies sore throat, rhinorrhea, cough, chest pain, dyspnea, palpitations, dizziness, PND orthopnea.  However he gets frequent lower extremity edema.  No abdominal pain, nausea, emesis, diarrhea, melena or hematochezia he occasionally gets constipated.  He also complains of mild dysuria, frequency and difficulty initiating urination.  He has stopped taking Flomax because he states that it makes his urinary symptoms worse.  Denies polyuria, polydipsia or polyphagia.  No heat or cold intolerance.    PT Comments    Patient received in chair; min guard for transfers; min guard to ambulate 140 feet today with RW. Patient does have 4 stairs with a hardrail to enter his home. Pt admitted with above diagnosis. Pt currently with functional limitations due to the deficits listed below (see PT Problem List). Pt will benefit from skilled PT to increase their independence and safety with mobility to allow discharge to the venue listed below.      Follow Up Recommendations  SNF;Supervision for mobility/OOB     Equipment Recommendations  None  recommended by PT    Recommendations for Other Services       Precautions / Restrictions Precautions Precautions: Fall Restrictions Weight Bearing Restrictions: No    Mobility  Bed Mobility               General bed mobility comments: patient received in chair  Transfers Overall transfer level: Needs assistance Equipment used: Rolling walker (2 wheeled) Transfers: Sit to/from Stand;Stand Pivot Transfers Sit to Stand: Min assist;Min guard Stand pivot transfers: Min guard       General transfer comment: slow labored movement  Ambulation/Gait Ambulation/Gait assistance: Min guard Ambulation Distance (Feet): 140 Feet Assistive device: Rolling walker (2 wheeled) Gait Pattern/deviations: Decreased step length - right;Decreased step length - left;Decreased stride length Gait velocity: slow   General Gait Details: slow unsteady cadence, no loss of balance   Stairs             Wheelchair Mobility    Modified Rankin (Stroke Patients Only)       Balance Overall balance assessment: Needs assistance Sitting-balance support: Feet supported;No upper extremity supported Sitting balance-Leahy Scale: Good     Standing balance support: Bilateral upper extremity supported;During functional activity Standing balance-Leahy Scale: Fair                              Cognition Arousal/Alertness: Awake/alert Behavior During Therapy: WFL for tasks assessed/performed Overall Cognitive Status: Within Functional Limits for tasks assessed  Exercises      General Comments        Pertinent Vitals/Pain Pain Assessment: No/denies pain    Home Living Family/patient expects to be discharged to:: Private residence Living Arrangements: Alone Available Help at Discharge: Neighbor Type of Home: Apartment Home Access: Stairs to enter Entrance Stairs-Rails: Right Home Layout: One level Home Equipment:  Environmental consultant - 2 wheels;Cane - single point;Bedside commode;Shower seat      Prior Function Level of Independence: Independent with assistive device(s)      Comments: has to use the RW first thing in the morning due to stiffness but once he gets moving he can ambulate without AD; short distanced community with [facility], drives   PT Goals (current goals can now be found in the care plan section) Progress towards PT goals: Progressing toward goals    Frequency    Min 3X/week      PT Plan      Co-evaluation              AM-PAC PT "6 Clicks" Daily Activity  Outcome Measure  Difficulty turning over in bed (including adjusting bedclothes, sheets and blankets)?: None Difficulty moving from lying on back to sitting on the side of the bed? : A Little Difficulty sitting down on and standing up from a chair with arms (e.g., wheelchair, bedside commode, etc,.)?: A Little Help needed moving to and from a bed to chair (including a wheelchair)?: A Little Help needed walking in hospital room?: A Little Help needed climbing 3-5 steps with a railing? : A Lot 6 Click Score: 18    End of Session Equipment Utilized During Treatment: Gait belt Activity Tolerance: Patient tolerated treatment well;Patient limited by fatigue Patient left: in chair;with call bell/phone within reach;with chair alarm set;with family/visitor present Nurse Communication: Mobility status PT Visit Diagnosis: Unsteadiness on feet (R26.81);Other abnormalities of gait and mobility (R26.89);Muscle weakness (generalized) (M62.81)     Time: 1330-1400 PT Time Calculation (min) (ACUTE ONLY): 30 min  Charges:  $Gait Training: 8-22 mins $Therapeutic Activity: 8-22 mins                    G Codes:       Lucrezia Dehne D. Hartnett-Rands, MS, PT [facility] [place] #[id number] 10/07/2017, 2:35 PM

## 2017-10-07 NOTE — Progress Notes (Signed)
Subjective: No abdominal pain. Eager to go home when he is able. No N/V.   Objective: Vital signs in last 24 hours: Temp:  [97.5 F (36.4 C)-98 F (36.7 C)] 97.5 F (36.4 C) (05/20 2147) Pulse Rate:  [73-85] 82 (05/21 0519) Resp:  [16-20] 18 (05/21 0519) BP: (116-144)/(56-69) 143/66 (05/21 0519) SpO2:  [90 %-97 %] 90 % (05/21 0519) Last BM Date: 10/01/17 General:   Alert and oriented, pleasant Head:  Normocephalic and atraumatic. Abdomen:  Bowel sounds present, soft, non-tender, non-distended. No HSM or hernias noted. No rebound or guarding. No masses appreciated  Extremities:  LLE with improving cellulitis, 2+ pitting edema, trace edema RLE  Neurologic:  Alert and  oriented x4;  grossly normal neurologically.  Intake/Output from previous day: 05/20 0701 - 05/21 0700 In: 1740 [P.O.:1540; IV Piggyback:200] Out: 2100 [Urine:1450; Stool:650] Intake/Output this shift: No intake/output data recorded.  Lab Results: Recent Labs    10/05/17 0409 10/06/17 0554 10/07/17 0543  WBC 8.2 8.2 7.2  HGB 14.4 14.1 14.0  HCT 44.3 44.3 42.9  PLT 147* 172 190   BMET Recent Labs    10/05/17 0409 10/06/17 0554 10/07/17 0543  NA 142 138 137  K 4.4 4.1 3.8  CL 109 106 104  CO2 25 23 26   GLUCOSE 134* 117* 117*  BUN 26* 19 16  CREATININE 0.80 0.63 0.62  CALCIUM 8.8* 8.5* 8.5*   LFT Recent Labs    10/05/17 0409 10/06/17 0554 10/07/17 0543  PROT 6.4* 6.4* 6.2*  ALBUMIN 2.7* 2.7* 2.7*  AST 164* 169* 100*  ALT 193* 258* 216*  ALKPHOS 53 65 65  BILITOT 0.7 0.8 0.9   PT/INR Recent Labs    10/06/17 1507  LABPROT 16.4*  INR 1.33   Hepatitis Panel Recent Labs    10/06/17 1507  HEPBSAG Negative  HCVAB <0.1  HEPAIGM Negative  HEPBIGM Negative    Studies/Results: US Abdomen Complete  Result Date: 10/05/2017 CLINICAL DATA:  Patient with elevated LFTs. EXAM: ABDOMEN ULTRASOUND COMPLETE COMPARISON:  Abdominal ultrasound 08/21/2015 FINDINGS: Gallbladder: Surgically  absent Common bile duct: Diameter: 6 mm Liver: No focal lesion identified. Within normal limits in parenchymal echogenicity. Portal vein is patent on color Doppler imaging with normal direction of blood flow towards the liver. IVC: No abnormality visualized. Pancreas: Not visualized. Spleen: Size and appearance within normal limits. Right Kidney: Length: 11.2 cm. Echogenicity within normal limits. No mass or hydronephrosis visualized. Left Kidney: Length: 13.2 cm. Echogenicity within normal limits. No mass or hydronephrosis visualized. Abdominal aorta: Not visualized. Other findings: None. IMPRESSION: No acute process. Electronically Signed   By: Lovey Newcomer M.D.   On: 10/05/2017 11:40    Assessment: 72 year old male admitted with sepsis in the setting of lower extremity cellulitis and UTI, improving clinically since admission. Found to have pulmonary edema in setting of acute diastolic heart failure and improving from this as well. LFTs normal on admission and remained normal for several days, with new onset elevated transaminases 2X upper limits of normal noted 10/04/17. Transaminases increased over 48 hours, now trending down. Hepatitis panel negative. [place] without any acute process.   Elevated transaminases non-specific and multifactorial in setting of acute illness, heart failure, antibiotics, history of statin use that has been discontinued this admission with last dose 10/04/17. As trending down, continue to follow for now. Pursue additional serologies if no improvement and/or worsening. Patient remains asymptomatic.   Dysphagia: solid food and pill dysphagia for one year. Tolerating diet. Possible EGD in  remote past. Has been seen by [facility] in the past but desires further outpatient GI evaluation in [place] if possible. Increased PPI to BID due to GERD exacerbation.    Plan: PPI BID Follow HFP back to baseline: further serologies if any increasing or without improvement as patient recovers from  acute illness Recommend outpatient follow-up with GI practice of his choice (prior evaluation by [facility] but has mentioned wanting to be seen in [place]) Will follow peripherally for now   Annitta Needs, PhD, ANP-BC [facility]    LOS: 6 days    10/07/2017, 7:56 AM

## 2017-10-07 NOTE — Plan of Care (Signed)
Patient up to chair with 1 assist and cane. Patient tolerated well.

## 2017-10-07 NOTE — Progress Notes (Signed)
PROGRESS NOTE    Gary Carroll  POE:[id number]  DOB: 03/31/1946  DOA: 09/30/2017 PCP: Celene Squibb, MD   Brief Admission Hx: Gary Carroll is a 72 y.o. male with medical history significant of allergic rhinitis, osteoarthritis, seasonal asthma, diverticulosis, ED, GERD, hiatal hernia, hyperlipidemia, history of hydronephrosis, history of overactive bladder, prediabetes, tinnitus, urinary frequency and urgency who is coming to the emergency department with complaints of fever, chills and weakness.  He was admitted with severe cellulitis of LLE.   MDM/Assessment & Plan:   1. Sepsis - resolved now. Secondary to severe cellulitis LLE - Lactate trended down. Hemodynamics much improved. Continue current treatments.    2. Severe nonpurulent Cellulitis of LLE  -currently on vancomycin and meropenem.  Patient is slowly improving.  Fevers have resolved.  Will discontinue further meropenem.  Continue on vancomycin for now 3. pulmonary Edema - from acute diastolic CHF.  IV fluids discontinued,  Lasix IV ordered and he diuresed well.  Echo normal EF with grade 1 DD. He has diuresed 5.5L.  Still has some evidence of volume overload.  Will give another dose of Lasix. 4. Transaminitis - Elevated transaminases noted, statin has been discontinued.  Abdominal ultrasound unrevealing.  Gastroenterology following and feels as likely reactive to his critical illness/medications.  Repeat LFTs this morning are trending down.  We will continue to monitor..  5. GAD / insomnia - anxiolytics ordered.   6. Klebsiella UTI -he is completed 5 days of meropenem.  This should be adequate to treat UTI. 7. Essential hypertension - stable, continue home meds.   8. Mixed hyperlipidemia - stable on home meds.  9. Thrombocytopenia - Platelets are holding stable for now, will continue to follow.  No bleeding.  10. Hypokalemia - oral replacement ordered.   11. GERD - continue protonix daily.  12. Paroxysmal Atrial  Fibrillation - continue metoprolol for rate control and apixaban for anticoagulation.  13. BPH - stable. Monitoring urine output. Pt does in/out caths at home, he has foley in place for now while on IV lasix.   14. OSA - Pt refusing to use CPAP.   DVT prophylaxis: lovenox Code Status: Full  Family Communication: No family present Disposition Plan: ongoing inpatient treatment needed with IV antibiotics   Subjective: Feels that he is slowly improving.  Anxious to go home.  Denies any shortness of breath  Objective: Vitals:   10/06/17 2048 10/06/17 2147 10/07/17 0519 10/07/17 1446  BP:  (!) 144/69 (!) 143/66 (!) 127/59  Pulse:  85 82 81  Resp:  16 18 18   Temp:  (!) 97.5 F (36.4 C)  98 F (36.7 C)  TempSrc:  Oral    SpO2: 94% 96% 90% 97%  Weight:      Height:        Intake/Output Summary (Last 24 hours) at 10/07/2017 1934 Last data filed at 10/07/2017 1843 Gross per 24 hour  Intake 1620 ml  Output 1900 ml  Net -280 ml   Filed Weights   10/03/17 0500 10/04/17 0400 10/05/17 0500  Weight: 113.4 kg (250 lb) 112.2 kg (247 lb 5.7 oz) 110.4 kg (243 lb 6.2 oz)   REVIEW OF SYSTEMS  As per history otherwise all reviewed and reported negative  Exam:  General exam: Alert, awake, oriented x 3 Respiratory system: Clear to auscultation. Respiratory effort normal. Cardiovascular system:RRR. No murmurs, rubs, gallops. Gastrointestinal system: Abdomen is nondistended, soft and nontender. No organomegaly or masses felt. Normal bowel sounds heard. Central nervous system:  Alert and oriented. No focal neurological deficits. Extremities: No C/C/E, +pedal pulses Skin: Erythema and darkening of skin noted over left lower extremity.  This is still tender to touch and is edematous Psychiatry: Judgement and insight appear normal. Mood & affect appropriate.    Data Reviewed: Basic Metabolic Panel: Recent Labs  Lab 10/01/17 0707 10/02/17 0408 10/03/17 0424 10/04/17 0439 10/05/17 0409  10/06/17 0554 10/07/17 0543  NA 139 137 134* 137 142 138 137  K 3.8 3.7 3.4* 4.1 4.4 4.1 3.8  CL 111 105 104 105 109 106 104  CO2 24 24 24 25 25 23 26   GLUCOSE 99 96 138* 144* 134* 117* 117*  BUN 26* 19 19 20  26* 19 16  CREATININE 1.05 0.96 0.70 0.63 0.80 0.63 0.62  CALCIUM 8.5* 8.2* 8.0* 8.4* 8.8* 8.5* 8.5*  MG 1.4* 2.1  --  1.9 2.1 2.0 1.9  PHOS 3.0  --   --   --   --   --   --    Liver Function Tests: Recent Labs  Lab 10/03/17 0424 10/04/17 0439 10/05/17 0409 10/06/17 0554 10/07/17 0543  AST 28 75* 164* 169* 100*  ALT 28 70* 193* 258* 216*  ALKPHOS 43 45 53 65 65  BILITOT 0.9 0.8 0.7 0.8 0.9  PROT 6.2* 6.2* 6.4* 6.4* 6.2*  ALBUMIN 2.9* 2.6* 2.7* 2.7* 2.7*   No results for input(s): LIPASE, AMYLASE in the last 168 hours. No results for input(s): AMMONIA in the last 168 hours. CBC: Recent Labs  Lab 10/03/17 0424 10/04/17 0439 10/05/17 0409 10/06/17 0554 10/07/17 0543  WBC 14.1* 8.8 8.2 8.2 7.2  NEUTROABS 13.0* 7.1 5.8 6.2 4.6  HGB 14.2 13.9 14.4 14.1 14.0  HCT 42.7 43.0 44.3 44.3 42.9  MCV 89.7 89.2 90.6 89.7 89.2  PLT 113* 128* 147* 172 190   Cardiac Enzymes: Recent Labs  Lab 09/30/17 2259  TROPONINI <0.03   CBG (last 3)  No results for input(s): GLUCAP in the last 72 hours. Recent Results (from the past 240 hour(s))  Blood culture (routine x 2)     Status: None   Collection Time: 09/30/17 10:59 PM  Result Value Ref Range Status   Specimen Description BLOOD RIGHT ARM  Final   Special Requests   Final    BOTTLES DRAWN AEROBIC AND ANAEROBIC Blood Culture adequate volume   Culture   Final    NO GROWTH 5 DAYS Performed at [facility], [address], [place], [place] [address]    Report Status 10/05/2017 FINAL  Final  Blood culture (routine x 2)     Status: None   Collection Time: 09/30/17 11:12 PM  Result Value Ref Range Status   Specimen Description BLOOD LEFT ARM  Final   Special Requests   Final    BOTTLES DRAWN AEROBIC AND ANAEROBIC Blood  Culture adequate volume   Culture   Final    NO GROWTH 5 DAYS Performed at [facility], [address], [place], [place] [address]    Report Status 10/05/2017 FINAL  Final  Urine culture     Status: Abnormal   Collection Time: 09/30/17 11:27 PM  Result Value Ref Range Status   Specimen Description   Final    URINE, CLEAN CATCH Performed at [facility], [address], [place], [place] [address]    Special Requests   Final    NONE Performed at [facility], [address] [place],  [address]    Culture >=100,000 COLONIES/mL KLEBSIELLA  PNEUMONIAE (A)  Final   Report Status 10/03/2017 FINAL  Final   Organism ID, Bacteria KLEBSIELLA PNEUMONIAE (A)  Final      Susceptibility   Klebsiella pneumoniae - MIC*    AMPICILLIN RESISTANT Resistant     CEFAZOLIN <=4 SENSITIVE Sensitive     CEFTRIAXONE <=1 SENSITIVE Sensitive     CIPROFLOXACIN <=0.25 SENSITIVE Sensitive     GENTAMICIN <=1 SENSITIVE Sensitive     IMIPENEM <=0.25 SENSITIVE Sensitive     NITROFURANTOIN 64 INTERMEDIATE Intermediate     TRIMETH/SULFA <=20 SENSITIVE Sensitive     AMPICILLIN/SULBACTAM 4 SENSITIVE Sensitive     PIP/TAZO 16 SENSITIVE Sensitive     Extended ESBL NEGATIVE Sensitive     * >=100,000 COLONIES/mL KLEBSIELLA PNEUMONIAE  MRSA PCR Screening     Status: None   Collection Time: 10/01/17  3:05 AM  Result Value Ref Range Status   MRSA by PCR NEGATIVE NEGATIVE Final    Comment:        The GeneXpert MRSA Assay (FDA approved for NASAL specimens only), is one component of a comprehensive MRSA colonization surveillance program. It is not intended to diagnose MRSA infection nor to guide or monitor treatment for MRSA infections. Performed at [facility], [address] [place], [place] [address]      Studies: No results found.  Scheduled Meds: . apixaban  5 mg Oral BID  . metoprolol tartrate  12.5 mg Oral BID  . multivitamin with minerals  1 tablet Oral Daily  . oxybutynin  10 mg Oral Daily  .  pantoprazole  40 mg Oral BID AC  . senna-docusate  2 tablet Oral BID   Continuous Infusions: . meropenem (MERREM) IV Stopped (10/07/17 1634)  . vancomycin 1,000 mg (10/07/17 1804)    Principal Problem:   Sepsis due to cellulitis [facility]) Active Problems:   Essential hypertension, benign   Mixed hyperlipidemia   Cellulitis of left lower extremity   GERD (gastroesophageal reflux disease)   AF (paroxysmal atrial fibrillation) (HCC)   BPH (benign prostatic hyperplasia)   Urinary tract infection   Sleep apnea   Elevated transaminase level   Kathie Dike, MD [facility] Pager [phone]  If 7PM-7AM, please contact night-coverage [email or web address] Password [facility] 10/07/2017, 7:34 PM    LOS: 6 days

## 2017-10-07 NOTE — Care Management (Signed)
Discussed HH PT vs SNF with patient. He ultimately wants to go home but  wants to know which facilities are options and if his insurance will cover cost. CSW consulted.

## 2017-10-08 ENCOUNTER — Telehealth: Payer: Self-pay | Admitting: Podiatry

## 2017-10-08 LAB — CBC WITH DIFFERENTIAL/PLATELET
BASOS PCT: 0 %
Basophils Absolute: 0 10*3/uL (ref 0.0–0.1)
EOS ABS: 0.2 10*3/uL (ref 0.0–0.7)
Eosinophils Relative: 3 %
HCT: 45 % (ref 39.0–52.0)
HEMOGLOBIN: 14.7 g/dL (ref 13.0–17.0)
LYMPHS ABS: 1.4 10*3/uL (ref 0.7–4.0)
LYMPHS PCT: 19 %
MCH: 29.5 pg (ref 26.0–34.0)
MCHC: 32.7 g/dL (ref 30.0–36.0)
MCV: 90.2 fL (ref 78.0–100.0)
Monocytes Absolute: 0.7 10*3/uL (ref 0.1–1.0)
Monocytes Relative: 9 %
NEUTROS ABS: 5.2 10*3/uL (ref 1.7–7.7)
Neutrophils Relative %: 69 %
Platelets: 232 10*3/uL (ref 150–400)
RBC: 4.99 MIL/uL (ref 4.22–5.81)
RDW: 14.6 % (ref 11.5–15.5)
WBC: 7.5 10*3/uL (ref 4.0–10.5)

## 2017-10-08 LAB — BASIC METABOLIC PANEL
Anion gap: 9 (ref 5–15)
BUN: 16 mg/dL (ref 6–20)
CHLORIDE: 103 mmol/L (ref 101–111)
CO2: 28 mmol/L (ref 22–32)
CREATININE: 0.7 mg/dL (ref 0.61–1.24)
Calcium: 9 mg/dL (ref 8.9–10.3)
GFR calc Af Amer: >60 mL/min (ref >60)
GFR calc non Af Amer: >60 mL/min (ref >60)
Glucose, Bld: 122 mg/dL — ABNORMAL HIGH (ref 65–99)
Potassium: 4 mmol/L (ref 3.5–5.1)
SODIUM: 140 mmol/L (ref 135–145)

## 2017-10-08 LAB — HEPATIC FUNCTION PANEL
ALK PHOS: 71 U/L (ref 38–126)
ALT: 216 U/L — ABNORMAL HIGH (ref 17–63)
AST: 97 U/L — ABNORMAL HIGH (ref 15–41)
Albumin: 2.8 g/dL — ABNORMAL LOW (ref 3.5–5.0)
BILIRUBIN INDIRECT: 0.6 mg/dL (ref 0.3–0.9)
Bilirubin, Direct: 0.1 mg/dL (ref 0.1–0.5)
TOTAL PROTEIN: 6.9 g/dL (ref 6.5–8.1)
Total Bilirubin: 0.7 mg/dL (ref 0.3–1.2)

## 2017-10-08 MED ORDER — FUROSEMIDE 10 MG/ML IJ SOLN
40.0000 mg | Freq: Two times a day (BID) | INTRAMUSCULAR | Status: DC
  Administered 2017-10-08: 40 mg via INTRAVENOUS
  Filled 2017-10-08 (×2): qty 4

## 2017-10-08 NOTE — Progress Notes (Signed)
Notified by Dr. Roderic Palau to bladder scan patient post void. Patient voided approximately 161ml in urinal, scan shows 530ml in bladder. Continued with in/out catheter per MD order.  RN attempted to perform in/out sterile procedure however patient irately refused stating, "I do this at home so I am going to do this here!" Patient was offered gloves to perform procedure and patient also refused gloves. Patient performs in/out with facilities provided in/out catheter and immediately approximately 742ml of clear, yellow urine was returned. Patient offered no complaints.

## 2017-10-08 NOTE — Clinical Social Work Note (Signed)
Clinical Social Work Assessment  Patient Details  Name: Gary Carroll MRN: [id number] Date of Birth: 1945-12-25  Date of referral:  10/08/17               Reason for consult:  Facility Placement                Permission sought to share information with:    Permission granted to share information::     Name::        Agency::     Relationship::     Contact Information:     Housing/Transportation Living arrangements for the past 2 months:  Single Family Home Source of Information:  Patient Patient Interpreter Needed:  None Criminal Activity/Legal Involvement Pertinent to Current Situation/Hospitalization:  No - Comment as needed Significant Relationships:  Neighbor, Other Family Members Lives with:  Self Do you feel safe going back to the place where you live?  Yes Need for family participation in patient care:  Yes (Comment)  Care giving concerns:  None identified.    Social Worker assessment / plan: Patient states that he had been thinking about STR at SNF. He indicated that at baseline he ambulates with a walker in the morning due to stiffness. He states that once he get going he ambulates with a cane. Patient states that he drives, goes to the [facility] and swims daily and is independent in his ADLs.   Patient states that he is not interested in SNF and will be going home at discharge. LCSW signing off.   Employment status:    Insurance informationEducational psychologist PT Recommendations:  [place] / Referral to community resources:  [place]  Patient/Family's Response to care:  Patient is not going to STR at [facility]. He will return home at discharge.   Patient/Family's Understanding of and Emotional Response to Diagnosis, Current Treatment, and Prognosis:  Patient indicates that he understands his diagnosis, treatment and prognosis and that he will be going home at discharge.   Emotional Assessment Appearance:  Appears stated  age Attitude/Demeanor/Rapport:    Affect (typically observed):  Calm Orientation:  Oriented to Self, Oriented to Place, Oriented to  Time, Oriented to Situation Alcohol / Substance use:  Not Applicable Psych involvement (Current and /or in the community):  No (Comment)  Discharge Needs  Concerns to be addressed:  Discharge Planning Concerns Readmission within the last 30 days:  No Current discharge risk:  None Barriers to Discharge:  No Barriers Identified   Ihor Gully, LCSW 10/08/2017, 1:55 PM

## 2017-10-08 NOTE — Progress Notes (Signed)
Patient ambulated in hallway with 1 assist and walker. Patient tolerated well with no complaints, back resting in bedside chair currently.

## 2017-10-08 NOTE — Care Management Important Message (Signed)
Important Message  Patient Details  Name: Gary Carroll MRN: [id number] Date of Birth: April 28, 1946   Medicare Important Message Given:  Yes    Shelda Altes 10/08/2017, 2:58 PM

## 2017-10-08 NOTE — Telephone Encounter (Signed)
This is Diane calling from the [facility]. I'm calling to get the office visit notes from date of service 14 July 2017 faxed to me at [phone] to get this appointment paid. Thank you.

## 2017-10-08 NOTE — Progress Notes (Signed)
PROGRESS NOTE    Gary Carroll  NIO:[id number]  DOB: [birth year]  DOA: 09/30/2017 PCP: Celene Squibb, MD   Brief Admission Hx: Gary Carroll is a 72 y.o. male with medical history significant of allergic rhinitis, osteoarthritis, seasonal asthma, diverticulosis, ED, GERD, hiatal hernia, hyperlipidemia, history of hydronephrosis, history of overactive bladder, prediabetes, tinnitus, urinary frequency and urgency who is coming to the emergency department with complaints of fever, chills and weakness.  He was admitted with severe cellulitis of LLE.   MDM/Assessment & Plan:   1. Sepsis - resolved now. Secondary to severe cellulitis LLE - Lactate trended down. Hemodynamics much improved. Continue current treatments.    2. Severe nonpurulent Cellulitis of LLE  -currently on vancomycin and meropenem.  Patient is slowly improving.  Fevers have resolved.  Since he continues to have significant erythema, will continue on vancomycin for now 3. pulmonary Edema - from acute diastolic CHF.  IV fluids discontinued,  Lasix IV ordered and he diuresed well.  Echo normal EF with grade 1 DD. He has diuresed 6.5L.  Still has some evidence of volume overload.  Continue with intravenous Lasix 4. Transaminitis - Elevated transaminases noted, statin has been discontinued.  Abdominal ultrasound unrevealing.  Gastroenterology following and feels as likely reactive to his critical illness/medications.  Repeat LFTs this morning are trending down.  This can likely be followed up as an outpatient 5. GAD / insomnia - anxiolytics ordered.   6. Klebsiella UTI -he is completed 5 days of meropenem.  This should be adequate to treat UTI. 7. Essential hypertension - stable, continue home meds.   8. Mixed hyperlipidemia - stable on home meds.  9. Thrombocytopenia - Platelets are holding stable for now, will continue to follow.  No bleeding.  10. Hypokalemia - oral replacement ordered.   11. GERD - continue protonix  daily.  12. Paroxysmal Atrial Fibrillation - continue metoprolol for rate control and apixaban for anticoagulation.  13. BPH - stable. Monitoring urine output.  Patient does in and out catheterization at home. 14. OSA - Pt refusing to use CPAP.   DVT prophylaxis: lovenox Code Status: Full  Family Communication: No family present Disposition Plan: Skilled nursing facility on discharge  Subjective: Left leg is still painful.  Overall he feels that he is improving.  Objective: Vitals:   10/07/17 2109 10/07/17 2140 10/08/17 0641 10/08/17 1420  BP:  (!) 155/74 (!) 145/65 121/62  Pulse:  77 75 71  Resp:  18 18 16   Temp:  97.6 F (36.4 C) 98.2 F (36.8 C) 98.3 F (36.8 C)  TempSrc:  Oral Oral Oral  SpO2: 95% 98% 97% 97%  Weight:      Height:        Intake/Output Summary (Last 24 hours) at 10/08/2017 1700 Last data filed at 10/08/2017 1032 Gross per 24 hour  Intake 960 ml  Output 1500 ml  Net -540 ml   Filed Weights   10/03/17 0500 10/04/17 0400 10/05/17 0500  Weight: 113.4 kg (250 lb) 112.2 kg (247 lb 5.7 oz) 110.4 kg (243 lb 6.2 oz)   REVIEW OF SYSTEMS  As per history otherwise all reviewed and reported negative  Exam:  General exam: Alert, awake, oriented x 3 Respiratory system: Clear to auscultation. Respiratory effort normal. Cardiovascular system:RRR. No murmurs, rubs, gallops. Gastrointestinal system: Abdomen is nondistended, soft and nontender. No organomegaly or masses felt. Normal bowel sounds heard. Central nervous system: Alert and oriented. No focal neurological deficits. Extremities: No cyanosis or clubbing +  pedal pulses Skin: Venous stasis changes lower extremities bilaterally.  Large area of erythema over the left lower leg.  Continued edema left lower extremity Psychiatry: Judgement and insight appear normal. Mood & affect appropriate.     Data Reviewed: Basic Metabolic Panel: Recent Labs  Lab 10/02/17 0408  10/04/17 0439 10/05/17 0409  10/06/17 0554 10/07/17 0543 10/08/17 0544  NA 137   < > 137 142 138 137 140  K 3.7   < > 4.1 4.4 4.1 3.8 4.0  CL 105   < > 105 109 106 104 103  CO2 24   < > 25 25 23 26 28   GLUCOSE 96   < > 144* 134* 117* 117* 122*  BUN 19   < > 20 26* 19 16 16   CREATININE 0.96   < > 0.63 0.80 0.63 0.62 0.70  CALCIUM 8.2*   < > 8.4* 8.8* 8.5* 8.5* 9.0  MG 2.1  --  1.9 2.1 2.0 1.9  --    < > = values in this interval not displayed.   Liver Function Tests: Recent Labs  Lab 10/04/17 0439 10/05/17 0409 10/06/17 0554 10/07/17 0543 10/08/17 0544  AST 75* 164* 169* 100* 97*  ALT 70* 193* 258* 216* 216*  ALKPHOS 45 53 65 65 71  BILITOT 0.8 0.7 0.8 0.9 0.7  PROT 6.2* 6.4* 6.4* 6.2* 6.9  ALBUMIN 2.6* 2.7* 2.7* 2.7* 2.8*   No results for input(s): LIPASE, AMYLASE in the last 168 hours. No results for input(s): AMMONIA in the last 168 hours. CBC: Recent Labs  Lab 10/04/17 0439 10/05/17 0409 10/06/17 0554 10/07/17 0543 10/08/17 0544  WBC 8.8 8.2 8.2 7.2 7.5  NEUTROABS 7.1 5.8 6.2 4.6 5.2  HGB 13.9 14.4 14.1 14.0 14.7  HCT 43.0 44.3 44.3 42.9 45.0  MCV 89.2 90.6 89.7 89.2 90.2  PLT 128* 147* 172 190 232   Cardiac Enzymes: No results for input(s): CKTOTAL, CKMB, CKMBINDEX, TROPONINI in the last 168 hours. CBG (last 3)  No results for input(s): GLUCAP in the last 72 hours. Recent Results (from the past 240 hour(s))  Blood culture (routine x 2)     Status: None   Collection Time: 09/30/17 10:59 PM  Result Value Ref Range Status   Specimen Description BLOOD RIGHT ARM  Final   Special Requests   Final    BOTTLES DRAWN AEROBIC AND ANAEROBIC Blood Culture adequate volume   Culture   Final    NO GROWTH 5 DAYS Performed at [facility], [address], [place], [place] [address]    Report Status 10/05/2017 FINAL  Final  Blood culture (routine x 2)     Status: None   Collection Time: 09/30/17 11:12 PM  Result Value Ref Range Status   Specimen Description BLOOD LEFT ARM  Final   Special  Requests   Final    BOTTLES DRAWN AEROBIC AND ANAEROBIC Blood Culture adequate volume   Culture   Final    NO GROWTH 5 DAYS Performed at [facility], [address] [place], [place] [address]    Report Status 10/05/2017 FINAL  Final  Urine culture     Status: Abnormal   Collection Time: 09/30/17 11:27 PM  Result Value Ref Range Status   Specimen Description   Final    URINE, CLEAN CATCH Performed at [facility], [address] [place], [place] [address]    Special Requests   Final    NONE Performed at [facility], [address]  [place], [place] [address]    Culture >=100,000 COLONIES/mL KLEBSIELLA PNEUMONIAE (A)  Final   Report Status 10/03/2017 FINAL  Final   Organism ID, Bacteria KLEBSIELLA PNEUMONIAE (A)  Final      Susceptibility   Klebsiella pneumoniae - MIC*    AMPICILLIN RESISTANT Resistant     CEFAZOLIN <=4 SENSITIVE Sensitive     CEFTRIAXONE <=1 SENSITIVE Sensitive     CIPROFLOXACIN <=0.25 SENSITIVE Sensitive     GENTAMICIN <=1 SENSITIVE Sensitive     IMIPENEM <=0.25 SENSITIVE Sensitive     NITROFURANTOIN 64 INTERMEDIATE Intermediate     TRIMETH/SULFA <=20 SENSITIVE Sensitive     AMPICILLIN/SULBACTAM 4 SENSITIVE Sensitive     PIP/TAZO 16 SENSITIVE Sensitive     Extended ESBL NEGATIVE Sensitive     * >=100,000 COLONIES/mL KLEBSIELLA PNEUMONIAE  MRSA PCR Screening     Status: None   Collection Time: 10/01/17  3:05 AM  Result Value Ref Range Status   MRSA by PCR NEGATIVE NEGATIVE Final    Comment:        The GeneXpert MRSA Assay (FDA approved for NASAL specimens only), is one component of a comprehensive MRSA colonization surveillance program. It is not intended to diagnose MRSA infection nor to guide or monitor treatment for MRSA infections. Performed at [facility], [address] [place], [place] [address]      Studies: No results found.  Scheduled Meds: . apixaban  5 mg Oral BID  . furosemide  40 mg Intravenous BID  . metoprolol tartrate  12.5  mg Oral BID  . multivitamin with minerals  1 tablet Oral Daily  . oxybutynin  10 mg Oral Daily  . pantoprazole  40 mg Oral BID AC   Continuous Infusions: . vancomycin Stopped (10/08/17 0802)    Principal Problem:   Sepsis due to cellulitis [facility]) Active Problems:   Essential hypertension, benign   Mixed hyperlipidemia   Cellulitis of left lower extremity   GERD (gastroesophageal reflux disease)   AF (paroxysmal atrial fibrillation) (HCC)   BPH (benign prostatic hyperplasia)   Urinary tract infection   Sleep apnea   Elevated transaminase level   Kathie Dike, MD [facility] Pager [phone]  If 7PM-7AM, please contact night-coverage [email or web address] Password [email or web address] 10/08/2017, 5:00 PM    LOS: 7 days

## 2017-10-08 NOTE — Progress Notes (Signed)
Physical Therapy Treatment Patient Details Name: Gary Carroll MRN: [id number] DOB: [birth year] Today's Date: 10/08/2017    History of Present Illness Gary Carroll is a 72 y.o. male with medical history significant of allergic rhinitis, osteoarthritis, seasonal asthma, diverticulosis, ED, GERD, hiatal hernia, hyperlipidemia, history of hydronephrosis, history of overactive bladder, prediabetes, tinnitus, urinary frequency and urgency who is coming to the emergency department with complaints of fever, chills and weakness for about an hour.  He mentions mentions that he took 1000 mg of Tylenol and came to the emergency department.  His left lower extremity has become erythematosus, erythematosus, warm and tender.  He denies any known trauma to the area, but occasionally scratches it.  He complains of sinus pressure headache and a left earache for the past 2 days.  He denies sore throat, rhinorrhea, cough, chest pain, dyspnea, palpitations, dizziness, PND orthopnea.  However he gets frequent lower extremity edema.  No abdominal pain, nausea, emesis, diarrhea, melena or hematochezia he occasionally gets constipated.  He also complains of mild dysuria, frequency and difficulty initiating urination.  He has stopped taking Flomax because he states that it makes his urinary symptoms worse.  Denies polyuria, polydipsia or polyphagia.  No heat or cold intolerance.    PT Comments    Pt sitting in chair, friendly and willing to participate with therapy today.  Pt limited by Bil knee pain Lt >Rt this session with pain scale 5/10; monitored through session.  Increased distance with gait today.  Pt slow and labored movements, no LOB episodes this session.  Pt requested to hold on stair training this session as is difficult for him and limited by pain.  EOS pt left in chair with call bell within reach and chair alarm set.  No reports of increased pain through session.    Follow Up Recommendations  SNF;Supervision for mobility/OOB     Equipment Recommendations  None recommended by PT    Recommendations for Other Services       Precautions / Restrictions Precautions Precautions: Fall Restrictions Weight Bearing Restrictions: No    Mobility  Bed Mobility               General bed mobility comments: patient received in chair  Transfers Overall transfer level: Needs assistance Equipment used: Rolling walker (2 wheeled) Transfers: Sit to/from Stand Sit to Stand: Min assist         General transfer comment: slow labored movement  Ambulation/Gait Ambulation/Gait assistance: Min guard Ambulation Distance (Feet): 160 Feet Assistive device: Rolling walker (2 wheeled) Gait Pattern/deviations: Decreased step length - right;Decreased step length - left;Decreased stride length Gait velocity: slow   General Gait Details: slow unsteady cadence, no loss of balance   Stairs             Wheelchair Mobility    Modified Rankin (Stroke Patients Only)       Balance                                            Cognition Arousal/Alertness: Awake/alert Behavior During Therapy: WFL for tasks assessed/performed Overall Cognitive Status: Within Functional Limits for tasks assessed                                        Exercises  General Comments        Pertinent Vitals/Pain Pain Score: 5  Pain Location: Bil knees Lt>Rt Pain Descriptors / Indicators: Sore Pain Intervention(s): Limited activity within patient's tolerance;Monitored during session    Home Living                      Prior Function            PT Goals (current goals can now be found in the care plan section) Progress towards PT goals: Progressing toward goals    Frequency    Min 3X/week      PT Plan      Co-evaluation              AM-PAC PT "6 Clicks" Daily Activity  Outcome Measure  Difficulty turning over in bed  (including adjusting bedclothes, sheets and blankets)?: None Difficulty moving from lying on back to sitting on the side of the bed? : A Little Difficulty sitting down on and standing up from a chair with arms (e.g., wheelchair, bedside commode, etc,.)?: A Little Help needed moving to and from a bed to chair (including a wheelchair)?: A Little Help needed walking in hospital room?: A Little Help needed climbing 3-5 steps with a railing? : A Lot 6 Click Score: 18    End of Session Equipment Utilized During Treatment: Gait belt Activity Tolerance: Patient tolerated treatment well;Patient limited by fatigue;Patient limited by pain;No increased pain Patient left: in chair;with call bell/phone within reach;with chair alarm set Nurse Communication: Mobility status PT Visit Diagnosis: Unsteadiness on feet (R26.81);Other abnormalities of gait and mobility (R26.89);Muscle weakness (generalized) (M62.81)     Time: 6283-1517 PT Time Calculation (min) (ACUTE ONLY): 20 min  Charges:  $Therapeutic Activity: 8-22 mins                    G Codes:       Ihor Austin, LPTA; CBIS [phone]  Aldona Lento 10/08/2017, 10:16 AM

## 2017-10-08 NOTE — NC FL2 (Signed)
[place] LEVEL OF CARE SCREENING TOOL     IDENTIFICATION  Patient Name: Gary Carroll Birthdate: 26-Jul-1945 Sex: male Admission Date (Current Location): 09/30/2017  [facility] and [place] Number:  [employer] and Address:  [place] [address] [place]      Provider Number: [id number]  Attending Physician Name and Address:  Kathie Dike, MD  Relative Name and Phone Number:  Chrissie Noa  [phone]    Current Level of Care: Hospital Recommended Level of Care: [place] Prior Approval Number: [id number]  Date Approved/Denied: 06/22/15 PASRR Number:    Discharge Plan: SNF    Current Diagnoses: Patient Active Problem List   Diagnosis Date Noted  . Elevated transaminase level   . Sleep apnea 10/01/2017  . BPH (benign prostatic hyperplasia) 02/04/2017  . Urinary tract infection 02/04/2017  . Cellulitis of left lower extremity 02/03/2017  . GERD (gastroesophageal reflux disease) 02/03/2017  . AF (paroxysmal atrial fibrillation) ([place]) 02/03/2017  . Sepsis due to cellulitis ([place]) 02/03/2017  . New onset atrial fibrillation ([place]) 08/24/2015  . Diuresis excessive 08/22/2015  . Bladder outlet obstruction 08/22/2015  . Hydronephrosis determined by ultrasound 08/22/2015  . AKI (acute kidney injury) ([place]) 08/22/2015  . Hypernatremia 08/22/2015  . Acute blood loss anemia 08/22/2015  . Hematuria 08/22/2015  . 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 Peripheral edema 08/31/2012  . Urinary frequency 08/31/2012  . Diverticulosis 03/23/2012  . Impaired fasting glucose 02/06/2012  . Decreased libido 03/07/2011  . Erectile dysfunction 03/07/2011  . Essential hypertension, benign 12/20/2010  . Mixed hyperlipidemia 12/20/2010  . ALLERGIC RHINITIS, SEASONAL 05/02/2010  . 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 HIATAL HERNIA, HX OF 05/02/2010    Orientation RESPIRATION BLADDER Height & Weight     Self, Situation,  Time, Place  Normal Continent Weight: 243 lb 6.2 oz (110.4 kg) Height:  6' (182.9 cm)  BEHAVIORAL SYMPTOMS/MOOD NEUROLOGICAL BOWEL NUTRITION STATUS      Continent Diet(see dc summary)  AMBULATORY STATUS COMMUNICATION OF NEEDS Skin   Extensive Assist Verbally Normal                       Personal Care Assistance Level of Assistance  Bathing, Feeding, Dressing Bathing Assistance: Limited assistance Feeding assistance: Independent Dressing Assistance: Limited assistance     Functional Limitations Info  Sight, Hearing, Speech Sight Info: Adequate Hearing Info: Adequate Speech Info: Adequate    SPECIAL CARE FACTORS FREQUENCY  PT (By licensed PT)     PT Frequency: 5 times week              Contractures Contractures Info: Not present    Additional Factors Info  Code Status, Allergies Code Status Info: full Allergies Info: Diovan, Tagamet           Current Medications (10/08/2017):  This is the current hospital active medication list Current Facility-Administered Medications  Medication Dose Route Frequency Provider Last Rate Last Dose  . acetaminophen (TYLENOL) tablet 650 mg  650 mg Oral Q6H PRN Reubin Milan, MD   650 mg at 10/08/17 0150   Or  . acetaminophen (TYLENOL) suppository 650 mg  650 mg Rectal Q6H PRN Reubin Milan, MD   650 mg at 10/02/17 1900  . ALPRAZolam Duanne Moron) tablet 0.25-0.5 mg  0.25-0.5 mg Oral TID PRN Wynetta Emery, [name], MD   0.5 mg at 10/08/17 0152  . apixaban (ELIQUIS) tablet 5 mg  5 mg Oral BID Olevia Bowens,  Gerri Lins, MD   5 mg at 10/08/17 0834  . famotidine (PEPCID) tablet 20 mg  20 mg Oral BID BM & HS PRN Reubin Milan, MD      . furosemide (LASIX) injection 40 mg  40 mg Intravenous BID Kathie Dike, MD      . hydrocortisone cream 1 %   Topical PRN Johnson, [name], MD      . metoprolol tartrate (LOPRESSOR) tablet 12.5 mg  12.5 mg Oral BID Johnson, [name], MD   12.5 mg at 10/08/17 0835  . multivitamin with minerals  tablet 1 tablet  1 tablet Oral Daily Reubin Milan, MD   1 tablet at 10/08/17 [phone]  . ondansetron (ZOFRAN) tablet 4 mg  4 mg Oral Q6H PRN Reubin Milan, MD       Or  . ondansetron [facility]) injection 4 mg  4 mg Intravenous Q6H PRN Reubin Milan, MD      . oxybutynin (DITROPAN-XL) 24 hr tablet 10 mg  10 mg Oral Daily Reubin Milan, MD   10 mg at 10/08/17 0834  . pantoprazole (PROTONIX) EC tablet 40 mg  40 mg Oral BID AC Annitta Needs, NP   40 mg at 10/08/17 0835  . sodium chloride (OCEAN) 0.65 % nasal spray 1 spray  1 spray Each Nare PRN Reubin Milan, MD      . vancomycin (VANCOCIN) IVPB 1000 mg/200 mL premix  1,000 mg Intravenous Q12H Murlean Iba, MD   Stopped at 10/08/17 0802     Discharge Medications: Please see discharge summary for a list of discharge medications.  Relevant Imaging Results:  Relevant Lab Results:   Additional Information SSN: [id number], [place]

## 2017-10-09 DIAGNOSIS — E782 Mixed hyperlipidemia: Secondary | ICD-10-CM

## 2017-10-09 DIAGNOSIS — R3914 Feeling of incomplete bladder emptying: Secondary | ICD-10-CM

## 2017-10-09 DIAGNOSIS — N401 Enlarged prostate with lower urinary tract symptoms: Secondary | ICD-10-CM

## 2017-10-09 LAB — CBC WITH DIFFERENTIAL/PLATELET
BASOS ABS: 0 10*3/uL (ref 0.0–0.1)
BASOS PCT: 0 %
EOS ABS: 0.1 10*3/uL (ref 0.0–0.7)
EOS PCT: 1 %
HCT: 43.6 % (ref 39.0–52.0)
Hemoglobin: 13.9 g/dL (ref 13.0–17.0)
Lymphocytes Relative: 19 %
Lymphs Abs: 1.7 10*3/uL (ref 0.7–4.0)
MCH: 28.8 pg (ref 26.0–34.0)
MCHC: 31.9 g/dL (ref 30.0–36.0)
MCV: 90.5 fL (ref 78.0–100.0)
MONO ABS: 0.8 10*3/uL (ref 0.1–1.0)
Monocytes Relative: 9 %
Neutro Abs: 6.1 10*3/uL (ref 1.7–7.7)
Neutrophils Relative %: 71 %
PLATELETS: 258 10*3/uL (ref 150–400)
RBC: 4.82 MIL/uL (ref 4.22–5.81)
RDW: 14.6 % (ref 11.5–15.5)
WBC: 8.7 10*3/uL (ref 4.0–10.5)

## 2017-10-09 LAB — BASIC METABOLIC PANEL
ANION GAP: 7 (ref 5–15)
BUN: 21 mg/dL — ABNORMAL HIGH (ref 6–20)
CALCIUM: 8.7 mg/dL — AB (ref 8.9–10.3)
CO2: 28 mmol/L (ref 22–32)
Chloride: 103 mmol/L (ref 101–111)
Creatinine, Ser: 0.93 mg/dL (ref 0.61–1.24)
GFR calc Af Amer: >60 mL/min (ref >60)
GLUCOSE: 109 mg/dL — AB (ref 65–99)
Potassium: 3.8 mmol/L (ref 3.5–5.1)
SODIUM: 138 mmol/L (ref 135–145)

## 2017-10-09 MED ORDER — FUROSEMIDE 20 MG PO TABS: 20.0000 mg | ORAL_TABLET | Freq: Every day | ORAL | 11 refills | Status: DC

## 2017-10-09 MED ORDER — POTASSIUM CHLORIDE ER 10 MEQ PO TBCR: 10.0000 meq | EXTENDED_RELEASE_TABLET | Freq: Every day | ORAL | 0 refills | Status: DC

## 2017-10-09 MED ORDER — DOXYCYCLINE HYCLATE 100 MG PO CAPS: 100.0000 mg | ORAL_CAPSULE | Freq: Two times a day (BID) | ORAL | 0 refills | Status: DC

## 2017-10-09 NOTE — Discharge Summary (Signed)
Physician Discharge Summary  Gary Carroll TMH:[id number] DOB: 09-11-1945 DOA: 09/30/2017  PCP: Celene Squibb, MD  Admit date: 09/30/2017 Discharge date: 10/09/2017  Admitted From: Home Disposition: Skilled nursing facility recommended by PT, but patient has refused and will discharge home  Recommendations for Outpatient Follow-up:  1. Follow up with PCP in 1-2 weeks 2. Please obtain BMP/CBC and liver function tests in one week  Home Health: Home health RN and PT Equipment/Devices:  Discharge Condition: Stable CODE STATUS: Full code Diet recommendation: Heart Healthy   Brief/Interim Summary: Gary Querry Delanceyis a 72 y.o.malewith medical history significant ofallergic rhinitis, osteoarthritis, seasonal asthma, diverticulosis, ED, GERD, hiatal hernia, hyperlipidemia, history of hydronephrosis, history of overactive bladder, prediabetes, tinnitus, urinary frequency and urgency who is coming to the emergency department with complaints of fever, chillsand weakness.  He was admitted with severe cellulitis of LLE.   Discharge Diagnoses:  Principal Problem:   Sepsis due to cellulitis [facility]) Active Problems:   Essential hypertension, benign   Mixed hyperlipidemia   Cellulitis of left lower extremity   GERD (gastroesophageal reflux disease)   AF (paroxysmal atrial fibrillation) (HCC)   BPH (benign prostatic hyperplasia)   Urinary tract infection   Sleep apnea   Elevated transaminase level  1. Sepsis - resolved now. Secondary to severe cellulitis LLE - Lactate trended down. Hemodynamics much improved.  Blood cultures showed no significant growth   2. Severe nonpurulent Cellulitis of LLE  -treated with vancomycin.    Patient has had slow improvement.  Fevers have resolved.  Likely has swelling and venous stasis is contributing to findings of erythema.  Clinically he is better.  He has been transitioned to oral doxycycline. 3. pulmonary Edema - from acute diastolic CHF.  Possibly  precipitated by aggressive hydration during sepsis.  He was treated with intravenous Lasix and has had good diuresis. Echo normal EF with grade 1 DD. He has diuresed -8.2L.    He is not short of breath.  Still has some lower extremity edema, but does have some degree of venous stasis.  He will be discharged on low-dose oral Lasix. 4. Transaminitis - Elevated transaminases noted, statin has been discontinued.  Abdominal ultrasound unrevealing.  Gastroenterology following and feels as likely reactive to his critical illness/medications.  Follow-up LFTs showed an overall downtrend.  He will need repeat labs in 1 week. 5. GAD / insomnia - anxiolytics ordered.   6. Klebsiella UTI -he was treated with 5 days of meropenem.  This should be adequate to treat UTI. 7. Essential hypertension - stable, continue home meds.   8. Mixed hyperlipidemia -statin currently on hold.  Consider resuming as outpatient if LFTs are stable 9. Thrombocytopenia - Platelets are holding stable for now, will continue to follow.  No bleeding.  10. Hypokalemia - oral replacement ordered.   11. GERD - continue protonix daily.  12. Paroxysmal Atrial Fibrillation - continue metoprolol for rate control and apixaban for anticoagulation.  13. BPH - stable. Monitoring urine output.  Patient does in and out catheterization at home. 14. OSA - Pt refusing to use CPAP.      Discharge Instructions  Discharge Instructions    Diet - low sodium heart healthy   Complete by:  As directed    Increase activity slowly   Complete by:  As directed      Allergies as of 10/09/2017      Reactions   Diovan [valsartan] Cough   Tagamet [cimetidine] Other (See Comments)   irrittability  Medication List    STOP taking these medications   pravastatin 40 MG tablet Commonly known as:  PRAVACHOL     TAKE these medications   apixaban 5 MG Tabs tablet Commonly known as:  ELIQUIS Take 1 tablet (5 mg total) by mouth 2 (two) times daily.    diphenhydrAMINE 25 MG tablet Commonly known as:  BENADRYL Take 50 mg by mouth at bedtime as needed for sleep.   doxycycline 100 MG capsule Commonly known as:  VIBRAMYCIN Take 1 capsule (100 mg total) by mouth 2 (two) times daily.   Fish Oil 1000 MG Caps Take 4 capsules by mouth daily.   furosemide 20 MG tablet Commonly known as:  LASIX Take 1 tablet (20 mg total) by mouth daily.   loratadine 10 MG tablet Commonly known as:  CLARITIN Take 10 mg by mouth daily.   Melatonin 5 MG Tabs Take 5 mg by mouth at bedtime.   metoprolol tartrate 25 MG tablet Commonly known as:  LOPRESSOR Take 1 tablet (25 mg total) by mouth 2 (two) times daily.   MULTIVITAL tablet Take 1 tablet by mouth daily.   oxybutynin 10 MG 24 hr tablet Commonly known as:  DITROPAN-XL Take 10 mg by mouth daily.   pantoprazole 40 MG tablet Commonly known as:  PROTONIX Take 1 tablet (40 mg total) by mouth daily.   potassium chloride 10 MEQ tablet Commonly known as:  K-DUR Take 1 tablet (10 mEq total) by mouth daily.   ranitidine 150 MG tablet Commonly known as:  ZANTAC Take 150 mg by mouth at bedtime as needed for heartburn.   sodium chloride 0.65 % Soln nasal spray Commonly known as:  OCEAN Place 1 spray into both nostrils as needed for congestion.   vitamin C 500 MG tablet Commonly known as:  ASCORBIC ACID Take 500 mg by mouth daily.       Allergies  Allergen Reactions  . Diovan [Valsartan] Cough  . Tagamet [Cimetidine] Other (See Comments)    irrittability    Consultations:  Gastroenterology   Procedures/Studies: Dg Chest 2 View  Result Date: 09/30/2017 CLINICAL DATA:  Code sepsis. Fever and weakness since 17HRS today. Hx of asthma, heart murmur, HTN, and former smoker. EXAM: CHEST - 2 VIEW COMPARISON:  Chest x-rays dated 02/04/2017 and 02/03/2017. FINDINGS: Stable cardiomegaly. Lungs are clear. No pleural effusion or pneumothorax seen. Osseous structures about the chest are  unremarkable. IMPRESSION: No active cardiopulmonary disease. No evidence of pneumonia or pulmonary edema. Electronically Signed   By: Franki Cabot M.D.   On: 09/30/2017 23:43   US Abdomen Complete  Result Date: 10/05/2017 CLINICAL DATA:  Patient with elevated LFTs. EXAM: ABDOMEN ULTRASOUND COMPLETE COMPARISON:  Abdominal ultrasound 08/21/2015 FINDINGS: Gallbladder: Surgically absent Common bile duct: Diameter: 6 mm Liver: No focal lesion identified. Within normal limits in parenchymal echogenicity. Portal vein is patent on color Doppler imaging with normal direction of blood flow towards the liver. IVC: No abnormality visualized. Pancreas: Not visualized. Spleen: Size and appearance within normal limits. Right Kidney: Length: 11.2 cm. Echogenicity within normal limits. No mass or hydronephrosis visualized. Left Kidney: Length: 13.2 cm. Echogenicity within normal limits. No mass or hydronephrosis visualized. Abdominal aorta: Not visualized. Other findings: None. IMPRESSION: No acute process. Electronically Signed   By: Lovey Newcomer M.D.   On: 10/05/2017 11:40   US Venous Img Lower Unilateral Left  Result Date: 10/03/2017 CLINICAL DATA:  72 year old male with left lower extremity pain EXAM: LEFT LOWER EXTREMITY VENOUS DOPPLER ULTRASOUND  TECHNIQUE: Gray-scale sonography with graded compression, as well as color Doppler and duplex ultrasound were performed to evaluate the lower extremity deep venous systems from the level of the common femoral vein and including the common femoral, femoral, profunda femoral, popliteal and calf veins including the posterior tibial, peroneal and gastrocnemius veins when visible. The superficial great saphenous vein was also interrogated. Spectral Doppler was utilized to evaluate flow at rest and with distal augmentation maneuvers in the common femoral, femoral and popliteal veins. COMPARISON:  None. FINDINGS: Contralateral Common Femoral Vein: Respiratory phasicity is normal and  symmetric with the symptomatic side. No evidence of thrombus. Normal compressibility. Common Femoral Vein: No evidence of thrombus. Normal compressibility, respiratory phasicity and response to augmentation. Saphenofemoral Junction: No evidence of thrombus. Normal compressibility and flow on color Doppler imaging. Profunda Femoral Vein: No evidence of thrombus. Normal compressibility and flow on color Doppler imaging. Femoral Vein: No evidence of thrombus. Normal compressibility, respiratory phasicity and response to augmentation. Popliteal Vein: No evidence of thrombus. Normal compressibility, respiratory phasicity and response to augmentation. Calf Veins: No evidence of thrombus. Normal compressibility and flow on color Doppler imaging. Superficial Great Saphenous Vein: No evidence of thrombus. Normal compressibility and flow on color Doppler imaging. Other Findings:  Edema IMPRESSION: Sonographic survey of the left lower extremity negative for DVT. Lower extremity edema Electronically Signed   By: Corrie Mckusick D.O.   On: 10/03/2017 08:02   Dg Chest Port 1 View  Result Date: 10/04/2017 CLINICAL DATA:  Pulmonary edema EXAM: PORTABLE CHEST 1 VIEW COMPARISON:  Yesterday FINDINGS: Normal heart size when accounting for fat pad. Stable vascular pedicle widening. Low volumes with generalized interstitial prominence. No Kerley lines or air bronchogram. No effusion or pneumothorax. IMPRESSION: 1. Stable from yesterday. 2. Low volumes and vascular congestion. Electronically Signed   By: Monte Fantasia M.D.   On: 10/04/2017 08:53   Dg Chest Port 1 View  Result Date: 10/03/2017 CLINICAL DATA:  Fever and weakness.  Shortness of breath. EXAM: PORTABLE CHEST 1 VIEW COMPARISON:  Oct 03, 2017 FINDINGS: The heart size is mildly enlarged. Pulmonary venous congestion/edema remains. No other interval changes. IMPRESSION: Mild cardiomegaly and pulmonary edema, similar in the interval. Electronically Signed   By: Dorise Bullion III M.D   On: 10/03/2017 07:42   Dg Chest Port 1 View  Result Date: 10/02/2017 CLINICAL DATA:  Shortness of breath and fever EXAM: PORTABLE CHEST 1 VIEW COMPARISON:  Sep 30, 2017 FINDINGS: There is no edema or consolidation. There is a minimal left pleural effusion. There is mild cardiac enlargement with pulmonary venous hypertension. No adenopathy. No bone lesions. IMPRESSION: Pulmonary vascular congestion. Minimal left pleural effusion. No frank edema or consolidation. Electronically Signed   By: Lowella Grip III M.D.   On: 10/02/2017 15:02   Echo: - Left ventricle: The cavity size was normal. There was mild   concentric hypertrophy. Systolic function was normal. The   estimated ejection fraction was in the range of 55% to 60%. Wall   motion was normal; there were no regional wall motion   abnormalities. There was an increased relative contribution of   atrial contraction to ventricular filling. Doppler parameters are   consistent with abnormal left ventricular relaxation (grade 1   diastolic dysfunction). - Aortic valve: There was mild to moderate stenosis. There was   moderate regurgitation. Valve area (VTI): 1.12 cm^2. Valve area   (Vmax): 1.05 cm^2. Valve area (Vmean): 1.03 cm^2. Regurgitation   pressure half-time: 427 ms. -  Mitral valve: Calcified annulus. - Pulmonary arteries: Systolic pressure could not be accurately   estimated.  Subjective: No new complaints.  Feeling better.  Overall swelling in lower extremities better.  Discharge Exam: Vitals:   10/08/17 2154 10/09/17 0626  BP: 123/60 (!) 145/61  Pulse: 90 74  Resp: 20 18  Temp: 98.4 F (36.9 C) 98 F (36.7 C)  SpO2: 94% 96%   Vitals:   10/08/17 1420 10/08/17 2028 10/08/17 2154 10/09/17 0626  BP: 121/62  123/60 (!) 145/61  Pulse: 71  90 74  Resp: 16  20 18   Temp: 98.3 F (36.8 C)  98.4 F (36.9 C) 98 F (36.7 C)  TempSrc: Oral  Oral Oral  SpO2: 97% 93% 94% 96%  Weight:      Height:         General: Pt is alert, awake, not in acute distress Cardiovascular: RRR, S1/S2 +, no rubs, no gallops Respiratory: CTA bilaterally, no wheezing, no rhonchi Abdominal: Soft, NT, ND, bowel sounds + Extremities: Venous stasis changes noted to lower extremities bilaterally, significant erythema present in left lower extremity, overall improving    The results of significant diagnostics from this hospitalization (including imaging, microbiology, ancillary and laboratory) are listed below for reference.     Microbiology: Recent Results (from the past 240 hour(s))  Blood culture (routine x 2)     Status: None   Collection Time: 09/30/17 10:59 PM  Result Value Ref Range Status   Specimen Description BLOOD RIGHT ARM  Final   Special Requests   Final    BOTTLES DRAWN AEROBIC AND ANAEROBIC Blood Culture adequate volume   Culture   Final    NO GROWTH 5 DAYS Performed at [facility], [address] [place], [place] [address]    Report Status 10/05/2017 FINAL  Final  Blood culture (routine x 2)     Status: None   Collection Time: 09/30/17 11:12 PM  Result Value Ref Range Status   Specimen Description BLOOD LEFT ARM  Final   Special Requests   Final    BOTTLES DRAWN AEROBIC AND ANAEROBIC Blood Culture adequate volume   Culture   Final    NO GROWTH 5 DAYS Performed at [facility], [address], [place], [place] [address]    Report Status 10/05/2017 FINAL  Final  Urine culture     Status: Abnormal   Collection Time: 09/30/17 11:27 PM  Result Value Ref Range Status   Specimen Description   Final    URINE, CLEAN CATCH Performed at [facility], [address], [place], [place] [address]    Special Requests   Final    NONE Performed at [facility], [address], [place], [place] [address]    Culture >=100,000 COLONIES/mL KLEBSIELLA PNEUMONIAE (A)  Final   Report Status 10/03/2017 FINAL  Final   Organism ID, Bacteria KLEBSIELLA PNEUMONIAE (A)  Final      Susceptibility    Klebsiella pneumoniae - MIC*    AMPICILLIN RESISTANT Resistant     CEFAZOLIN <=4 SENSITIVE Sensitive     CEFTRIAXONE <=1 SENSITIVE Sensitive     CIPROFLOXACIN <=0.25 SENSITIVE Sensitive     GENTAMICIN <=1 SENSITIVE Sensitive     IMIPENEM <=0.25 SENSITIVE Sensitive     NITROFURANTOIN 64 INTERMEDIATE Intermediate     TRIMETH/SULFA <=20 SENSITIVE Sensitive     AMPICILLIN/SULBACTAM 4 SENSITIVE Sensitive     PIP/TAZO 16 SENSITIVE Sensitive     Extended ESBL NEGATIVE Sensitive     * >=  100,000 COLONIES/mL KLEBSIELLA PNEUMONIAE  MRSA PCR Screening     Status: None   Collection Time: 10/01/17  3:05 AM  Result Value Ref Range Status   MRSA by PCR NEGATIVE NEGATIVE Final    Comment:        The GeneXpert MRSA Assay (FDA approved for NASAL specimens only), is one component of a comprehensive MRSA colonization surveillance program. It is not intended to diagnose MRSA infection nor to guide or monitor treatment for MRSA infections. Performed at [facility], [address], [place], [place] [address]      Labs: BNP (last 3 results) Recent Labs    02/03/17 2004  BNP 75.1   Basic Metabolic Panel: Recent Labs  Lab 10/04/17 0439 10/05/17 0409 10/06/17 0554 10/07/17 0543 10/08/17 0544 10/09/17 0506  NA 137 142 138 137 140 138  K 4.1 4.4 4.1 3.8 4.0 3.8  CL 105 109 106 104 103 103  CO2 25 25 23 26 28 28   GLUCOSE 144* 134* 117* 117* 122* 109*  BUN 20 26* 19 16 16  21*  CREATININE 0.63 0.80 0.63 0.62 0.70 0.93  CALCIUM 8.4* 8.8* 8.5* 8.5* 9.0 8.7*  MG 1.9 2.1 2.0 1.9  --   --    Liver Function Tests: Recent Labs  Lab 10/04/17 0439 10/05/17 0409 10/06/17 0554 10/07/17 0543 10/08/17 0544  AST 75* 164* 169* 100* 97*  ALT 70* 193* 258* 216* 216*  ALKPHOS 45 53 65 65 71  BILITOT 0.8 0.7 0.8 0.9 0.7  PROT 6.2* 6.4* 6.4* 6.2* 6.9  ALBUMIN 2.6* 2.7* 2.7* 2.7* 2.8*   No results for input(s): LIPASE, AMYLASE in the last 168 hours. No results for input(s): AMMONIA in the last 168  hours. CBC: Recent Labs  Lab 10/05/17 0409 10/06/17 0554 10/07/17 0543 10/08/17 0544 10/09/17 0506  WBC 8.2 8.2 7.2 7.5 8.7  NEUTROABS 5.8 6.2 4.6 5.2 6.1  HGB 14.4 14.1 14.0 14.7 13.9  HCT 44.3 44.3 42.9 45.0 43.6  MCV 90.6 89.7 89.2 90.2 90.5  PLT 147* 172 190 232 258   Cardiac Enzymes: No results for input(s): CKTOTAL, CKMB, CKMBINDEX, TROPONINI in the last 168 hours. BNP: Invalid input(s): POCBNP CBG: No results for input(s): GLUCAP in the last 168 hours. D-Dimer No results for input(s): DDIMER in the last 72 hours. Hgb A1c No results for input(s): HGBA1C in the last 72 hours. Lipid Profile No results for input(s): CHOL, HDL, LDLCALC, TRIG, CHOLHDL, LDLDIRECT in the last 72 hours. Thyroid function studies No results for input(s): TSH, T4TOTAL, T3FREE, THYROIDAB in the last 72 hours.  Invalid input(s): FREET3 Anemia work up No results for input(s): VITAMINB12, FOLATE, FERRITIN, TIBC, IRON, RETICCTPCT in the last 72 hours. Urinalysis    Component Value Date/Time   COLORURINE YELLOW 10/01/2017 0120   APPEARANCEUR CLOUDY (A) 10/01/2017 0120   LABSPEC 1.015 10/01/2017 0120   PHURINE 6.0 10/01/2017 0120   GLUCOSEU NEGATIVE 10/01/2017 0120   HGBUR SMALL (A) 10/01/2017 0120   BILIRUBINUR NEGATIVE 10/01/2017 0120   BILIRUBINUR neg 08/31/2012 1203   KETONESUR NEGATIVE 10/01/2017 0120   PROTEINUR NEGATIVE 10/01/2017 0120   UROBILINOGEN negative 08/31/2012 1203   UROBILINOGEN 0.2 05/31/2010 1125   NITRITE NEGATIVE 10/01/2017 0120   LEUKOCYTESUR LARGE (A) 10/01/2017 0120   Sepsis Labs Invalid input(s): PROCALCITONIN,  WBC,  LACTICIDVEN Microbiology Recent Results (from the past 240 hour(s))  Blood culture (routine x 2)     Status: None   Collection Time: 09/30/17 10:59 PM  Result Value Ref  Range Status   Specimen Description BLOOD RIGHT ARM  Final   Special Requests   Final    BOTTLES DRAWN AEROBIC AND ANAEROBIC Blood Culture adequate volume   Culture   Final     NO GROWTH 5 DAYS Performed at [facility], [address], [place], [place] [address]    Report Status 10/05/2017 FINAL  Final  Blood culture (routine x 2)     Status: None   Collection Time: 09/30/17 11:12 PM  Result Value Ref Range Status   Specimen Description BLOOD LEFT ARM  Final   Special Requests   Final    BOTTLES DRAWN AEROBIC AND ANAEROBIC Blood Culture adequate volume   Culture   Final    NO GROWTH 5 DAYS Performed at [facility], [address], [place], [place] [address]    Report Status 10/05/2017 FINAL  Final  Urine culture     Status: Abnormal   Collection Time: 09/30/17 11:27 PM  Result Value Ref Range Status   Specimen Description   Final    URINE, CLEAN CATCH Performed at [facility], [address], [place], [place] [address]    Special Requests   Final    NONE Performed at [facility], [address], [place], [place] [address]    Culture >=100,000 COLONIES/mL KLEBSIELLA PNEUMONIAE (A)  Final   Report Status 10/03/2017 FINAL  Final   Organism ID, Bacteria KLEBSIELLA PNEUMONIAE (A)  Final      Susceptibility   Klebsiella pneumoniae - MIC*    AMPICILLIN RESISTANT Resistant     CEFAZOLIN <=4 SENSITIVE Sensitive     CEFTRIAXONE <=1 SENSITIVE Sensitive     CIPROFLOXACIN <=0.25 SENSITIVE Sensitive     GENTAMICIN <=1 SENSITIVE Sensitive     IMIPENEM <=0.25 SENSITIVE Sensitive     NITROFURANTOIN 64 INTERMEDIATE Intermediate     TRIMETH/SULFA <=20 SENSITIVE Sensitive     AMPICILLIN/SULBACTAM 4 SENSITIVE Sensitive     PIP/TAZO 16 SENSITIVE Sensitive     Extended ESBL NEGATIVE Sensitive     * >=100,000 COLONIES/mL KLEBSIELLA PNEUMONIAE  MRSA PCR Screening     Status: None   Collection Time: 10/01/17  3:05 AM  Result Value Ref Range Status   MRSA by PCR NEGATIVE NEGATIVE Final    Comment:        The GeneXpert MRSA Assay (FDA approved for NASAL specimens only), is one component of a comprehensive MRSA colonization surveillance program. It is not intended  to diagnose MRSA infection nor to guide or monitor treatment for MRSA infections. Performed at [facility], [address], [place], [place] [address]      Time coordinating discharge: 20mins  SIGNED:   Kathie Dike, MD  [facility] 10/09/2017, 11:56 AM Pager   If 7PM-7AM, please contact night-coverage [email or web address] Password [email or web address]

## 2017-10-09 NOTE — Progress Notes (Signed)
Physical Therapy Treatment Patient Details Name: Gary Carroll MRN: [id number] DOB: September 14, 1945 Today's Date: 10/09/2017    History of Present Illness Gary Carroll is a 72 y.o. male with medical history significant of allergic rhinitis, osteoarthritis, seasonal asthma, diverticulosis, ED, GERD, hiatal hernia, hyperlipidemia, history of hydronephrosis, history of overactive bladder, prediabetes, tinnitus, urinary frequency and urgency who is coming to the emergency department with complaints of fever, chills and weakness for about an hour.  He mentions mentions that he took 1000 mg of Tylenol and came to the emergency department.  His left lower extremity has become erythematosus, erythematosus, warm and tender.  He denies any known trauma to the area, but occasionally scratches it.  He complains of sinus pressure headache and a left earache for the past 2 days.  He denies sore throat, rhinorrhea, cough, chest pain, dyspnea, palpitations, dizziness, PND orthopnea.  However he gets frequent lower extremity edema.  No abdominal pain, nausea, emesis, diarrhea, melena or hematochezia he occasionally gets constipated.  He also complains of mild dysuria, frequency and difficulty initiating urination.  He has stopped taking Flomax because he states that it makes his urinary symptoms worse.  Denies polyuria, polydipsia or polyphagia.  No heat or cold intolerance.    PT Comments    Patient progressing well. Bed mobility with supervision; transfers with min guard; ambulated 250 feet using RW with min guard to supervision assistance. Attempted SPC but patient recognized he needed RW for stability and support. Climbed up 5 stairs with SPC and right handrail leading with Rt LE in a step to pattern; down 5 stairs with SPC and left handrail leading with Lt LE in a step to pattern; all with supervision to min guard for safety.  Patient would continue to benefit from skilled physical therapy in current  environment and next venue to continue return to prior function and increase strength, endurance, balance, coordination, and functional mobility and gait skills.    Follow Up Recommendations  SNF;Supervision for mobility/OOB     Equipment Recommendations  None recommended by PT    Recommendations for Other Services       Precautions / Restrictions Precautions Precautions: Fall Restrictions Weight Bearing Restrictions: No    Mobility  Bed Mobility Overal bed mobility: Needs Assistance Bed Mobility: Supine to Sit     Supine to sit: Supervision        Transfers Overall transfer level: Needs assistance Equipment used: Rolling walker (2 wheeled) Transfers: Sit to/from [facility] Sit to Stand: Min guard Stand pivot transfers: Min guard       General transfer comment: slow labored movement  Ambulation/Gait Ambulation/Gait assistance: Min guard;Supervision Ambulation Distance (Feet): 250 Feet Assistive device: Rolling walker (2 wheeled) Gait Pattern/deviations: Decreased step length - right;Decreased step length - left;Decreased stride length;Trunk flexed Gait velocity: decreased   General Gait Details: slow unsteady cadence, no loss of balance; patient attempted ambulation with SPC but independently identified he was more unstable and asked for the RW back to finish ambulation.   Stairs Stairs: Yes Stairs assistance: Min guard Stair Management: One rail Right;With cane;Step to pattern Number of Stairs: 5 General stair comments: up with the right; down with the left to favor right leg   Wheelchair Mobility    Modified Rankin (Stroke Patients Only)       Balance Overall balance assessment: Needs assistance Sitting-balance support: Feet supported;No upper extremity supported Sitting balance-Leahy Scale: Good     Standing balance support: Bilateral upper extremity supported;During functional activity  Standing balance-Leahy Scale: Fair                               Cognition Arousal/Alertness: Awake/alert Behavior During Therapy: WFL for tasks assessed/performed Overall Cognitive Status: Within Functional Limits for tasks assessed                                        Exercises      General Comments        Pertinent Vitals/Pain Pain Score: 4  Pain Location: Bil knees Lt>Rt Pain Descriptors / Indicators: Sore Pain Intervention(s): Limited activity within patient's tolerance;Monitored during session    Home Living                      Prior Function            PT Goals (current goals can now be found in the care plan section) Progress towards PT goals: Progressing toward goals    Frequency    Min 3X/week      PT Plan Current plan remains appropriate(patient refusing SNF)    Co-evaluation              AM-PAC PT "6 Clicks" Daily Activity  Outcome Measure  Difficulty turning over in bed (including adjusting bedclothes, sheets and blankets)?: None Difficulty moving from lying on back to sitting on the side of the bed? : A Little Difficulty sitting down on and standing up from a chair with arms (e.g., wheelchair, bedside commode, etc,.)?: A Little Help needed moving to and from a bed to chair (including a wheelchair)?: A Little Help needed walking in hospital room?: A Little Help needed climbing 3-5 steps with a railing? : A Little 6 Click Score: 19    End of Session Equipment Utilized During Treatment: Gait belt Activity Tolerance: Patient tolerated treatment well Patient left: in chair;with call bell/phone within reach;with chair alarm set Nurse Communication: Mobility status PT Visit Diagnosis: Unsteadiness on feet (R26.81);Other abnormalities of gait and mobility (R26.89);Muscle weakness (generalized) (M62.81)     Time: 1310-1340 PT Time Calculation (min) (ACUTE ONLY): 30 min  Charges:  $Gait Training: 23-37 mins                    G Codes:        Anora Schwenke D. Hartnett-Rands, MS, PT Per [place] [phone] 10/09/2017, 2:15 PM

## 2017-10-09 NOTE — Care Management (Addendum)
Met with patient again. He declines SNF. He declines home health.  Reports he can manage at home. Plans to continue his exercising at the [facility] as he was doing prior to admission. Has transportation and PCP.  Has cane and RW at home.  He is aware that if he changes his mind about HH, he can contact his PCP to order.

## 2017-10-09 NOTE — Care Management Note (Signed)
Case Management Note  Patient Details  Name: Gary Carroll MRN: [id number] Date of Birth: [birth year]   If discussed at [place] Length of Stay Meetings, dates discussed:  10/09/2017  Additional Comments:  [name], [name], RN 10/09/2017, 12:04 PM

## 2017-10-21 DIAGNOSIS — N390 Urinary tract infection, site not specified: Secondary | ICD-10-CM | POA: Diagnosis not present

## 2017-10-21 DIAGNOSIS — L03116 Cellulitis of left lower limb: Secondary | ICD-10-CM | POA: Diagnosis not present

## 2017-10-21 DIAGNOSIS — R600 Localized edema: Secondary | ICD-10-CM | POA: Diagnosis not present

## 2017-10-21 DIAGNOSIS — Z6836 Body mass index (BMI) 36.0-36.9, adult: Secondary | ICD-10-CM | POA: Diagnosis not present

## 2017-10-27 DIAGNOSIS — Z6835 Body mass index (BMI) 35.0-35.9, adult: Secondary | ICD-10-CM | POA: Diagnosis not present

## 2017-10-27 DIAGNOSIS — G9009 Other idiopathic peripheral autonomic neuropathy: Secondary | ICD-10-CM | POA: Diagnosis not present

## 2017-10-27 DIAGNOSIS — J019 Acute sinusitis, unspecified: Secondary | ICD-10-CM | POA: Diagnosis not present

## 2017-10-27 DIAGNOSIS — J3089 Other allergic rhinitis: Secondary | ICD-10-CM | POA: Diagnosis not present

## 2017-10-27 DIAGNOSIS — L03119 Cellulitis of unspecified part of limb: Secondary | ICD-10-CM | POA: Diagnosis not present

## 2017-10-27 DIAGNOSIS — R600 Localized edema: Secondary | ICD-10-CM | POA: Diagnosis not present

## 2017-10-27 DIAGNOSIS — I482 Chronic atrial fibrillation: Secondary | ICD-10-CM | POA: Diagnosis not present

## 2017-10-27 DIAGNOSIS — I872 Venous insufficiency (chronic) (peripheral): Secondary | ICD-10-CM | POA: Diagnosis not present

## 2017-10-27 DIAGNOSIS — R7301 Impaired fasting glucose: Secondary | ICD-10-CM | POA: Diagnosis not present

## 2017-10-27 DIAGNOSIS — G4730 Sleep apnea, unspecified: Secondary | ICD-10-CM | POA: Diagnosis not present

## 2017-11-11 DIAGNOSIS — Z6835 Body mass index (BMI) 35.0-35.9, adult: Secondary | ICD-10-CM | POA: Diagnosis not present

## 2017-11-11 DIAGNOSIS — R600 Localized edema: Secondary | ICD-10-CM | POA: Diagnosis not present

## 2017-12-30 DIAGNOSIS — I482 Chronic atrial fibrillation: Secondary | ICD-10-CM | POA: Diagnosis not present

## 2017-12-30 DIAGNOSIS — G4730 Sleep apnea, unspecified: Secondary | ICD-10-CM | POA: Diagnosis not present

## 2017-12-30 DIAGNOSIS — G9009 Other idiopathic peripheral autonomic neuropathy: Secondary | ICD-10-CM | POA: Diagnosis not present

## 2017-12-30 DIAGNOSIS — J019 Acute sinusitis, unspecified: Secondary | ICD-10-CM | POA: Diagnosis not present

## 2017-12-30 DIAGNOSIS — L03039 Cellulitis of unspecified toe: Secondary | ICD-10-CM | POA: Diagnosis not present

## 2017-12-30 DIAGNOSIS — E119 Type 2 diabetes mellitus without complications: Secondary | ICD-10-CM | POA: Diagnosis not present

## 2017-12-30 DIAGNOSIS — R7301 Impaired fasting glucose: Secondary | ICD-10-CM | POA: Diagnosis not present

## 2017-12-30 DIAGNOSIS — L03116 Cellulitis of left lower limb: Secondary | ICD-10-CM | POA: Diagnosis not present

## 2017-12-30 DIAGNOSIS — J3089 Other allergic rhinitis: Secondary | ICD-10-CM | POA: Diagnosis not present

## 2017-12-30 DIAGNOSIS — I872 Venous insufficiency (chronic) (peripheral): Secondary | ICD-10-CM | POA: Diagnosis not present

## 2018-01-02 DIAGNOSIS — I482 Chronic atrial fibrillation: Secondary | ICD-10-CM | POA: Diagnosis not present

## 2018-01-02 DIAGNOSIS — E1169 Type 2 diabetes mellitus with other specified complication: Secondary | ICD-10-CM | POA: Diagnosis not present

## 2018-01-02 DIAGNOSIS — R339 Retention of urine, unspecified: Secondary | ICD-10-CM | POA: Diagnosis not present

## 2018-01-02 DIAGNOSIS — M810 Age-related osteoporosis without current pathological fracture: Secondary | ICD-10-CM | POA: Diagnosis not present

## 2018-01-02 DIAGNOSIS — R944 Abnormal results of kidney function studies: Secondary | ICD-10-CM | POA: Diagnosis not present

## 2018-01-02 DIAGNOSIS — I10 Essential (primary) hypertension: Secondary | ICD-10-CM | POA: Diagnosis not present

## 2018-01-02 DIAGNOSIS — D509 Iron deficiency anemia, unspecified: Secondary | ICD-10-CM | POA: Diagnosis not present

## 2018-01-02 DIAGNOSIS — E782 Mixed hyperlipidemia: Secondary | ICD-10-CM | POA: Diagnosis not present

## 2018-01-02 DIAGNOSIS — G4730 Sleep apnea, unspecified: Secondary | ICD-10-CM | POA: Diagnosis not present

## 2018-02-25 DIAGNOSIS — E230 Hypopituitarism: Secondary | ICD-10-CM | POA: Diagnosis not present

## 2018-03-10 ENCOUNTER — Encounter: Payer: Self-pay | Admitting: Physical Medicine & Rehabilitation

## 2018-03-19 ENCOUNTER — Encounter: Payer: Self-pay | Admitting: Physical Medicine & Rehabilitation

## 2018-03-19 ENCOUNTER — Ambulatory Visit (HOSPITAL_BASED_OUTPATIENT_CLINIC_OR_DEPARTMENT_OTHER): Payer: Non-veteran care | Admitting: Physical Medicine & Rehabilitation

## 2018-03-19 ENCOUNTER — Encounter: Payer: No Typology Code available for payment source | Attending: Physical Medicine & Rehabilitation

## 2018-03-19 ENCOUNTER — Other Ambulatory Visit: Payer: Self-pay

## 2018-03-19 VITALS — BP 116/64 | HR 74 | Ht 72.0 in | Wt 255.8 lb

## 2018-03-19 DIAGNOSIS — E785 Hyperlipidemia, unspecified: Secondary | ICD-10-CM | POA: Diagnosis not present

## 2018-03-19 DIAGNOSIS — Z87891 Personal history of nicotine dependence: Secondary | ICD-10-CM | POA: Insufficient documentation

## 2018-03-19 DIAGNOSIS — I10 Essential (primary) hypertension: Secondary | ICD-10-CM | POA: Diagnosis not present

## 2018-03-19 DIAGNOSIS — G4730 Sleep apnea, unspecified: Secondary | ICD-10-CM | POA: Insufficient documentation

## 2018-03-19 DIAGNOSIS — K219 Gastro-esophageal reflux disease without esophagitis: Secondary | ICD-10-CM | POA: Diagnosis not present

## 2018-03-19 DIAGNOSIS — G8929 Other chronic pain: Secondary | ICD-10-CM

## 2018-03-19 DIAGNOSIS — R7303 Prediabetes: Secondary | ICD-10-CM | POA: Insufficient documentation

## 2018-03-19 DIAGNOSIS — M25562 Pain in left knee: Secondary | ICD-10-CM | POA: Insufficient documentation

## 2018-03-19 DIAGNOSIS — Z96653 Presence of artificial knee joint, bilateral: Secondary | ICD-10-CM | POA: Diagnosis not present

## 2018-03-19 DIAGNOSIS — M25569 Pain in unspecified knee: Secondary | ICD-10-CM

## 2018-03-19 DIAGNOSIS — M170 Bilateral primary osteoarthritis of knee: Secondary | ICD-10-CM | POA: Diagnosis not present

## 2018-03-19 DIAGNOSIS — J45909 Unspecified asthma, uncomplicated: Secondary | ICD-10-CM | POA: Insufficient documentation

## 2018-03-19 DIAGNOSIS — G8928 Other chronic postprocedural pain: Secondary | ICD-10-CM | POA: Insufficient documentation

## 2018-03-19 NOTE — Progress Notes (Signed)
Subjective:    Patient ID: Gary Carroll, male    DOB: Nov 13, 1945, 72 y.o.   MRN: [id number] CC:  Left knee pain HPI 72 yo male with 7-8 yr hx of Left knee, pt had some improvement post op but over the last several years pain has worsened.  Pt has followed up with Dr Ronnie Derby who reportedly found no loosening of hardware, good alignment.  Pt has Right knee TKR Jan 2017 this has not been as painful  RIdes exercise bike 31mn per day and swims 1-1.5 mi daily , 5 days per week. Pt is not interested in taking chronic opioid PMH Prostate CA, also hospitalized for cellulitis and sepsis , May 2019, has not been on abx lately  No significant low back pain Pain Inventory Average Pain 7 Pain Right Now 4 My pain is constant, dull and aching  In the last 24 hours, has pain interfered with the following? General activity 7 Relation with others 5 Enjoyment of life 7 What TIME of day is your pain at its worst? none Sleep (in general) Fair  Pain is worse with: walking and standing Pain improves with: rest Relief from Meds: 5  Mobility walk without assistance use a cane how many minutes can you walk? 20 ability to climb steps?  yes do you drive?  yes  Function retired  Neuro/Psych bladder control problems tingling trouble walking  Prior Studies Any changes since last visit?  no bone scan x-rays CT/MRI  Physicians involved in your care Any changes since last visit?  no [facility]    Family History  Problem Relation Age of Onset  . Uterine cancer Mother   . Cancer Mother        uterine  . Coronary artery disease Father   . Coronary artery disease Paternal Grandfather   . Heart disease Paternal Grandfather   . Colon cancer Neg Hx   . Esophageal cancer Neg Hx   . Stomach cancer Neg Hx   . Rectal cancer Neg Hx   . Liver disease Neg Hx    Social History   Socioeconomic History  . Marital status: Divorced    Spouse name: Not on file  . Number of children: 0    . Years of education: Not on file  . Highest education level: Not on file  Occupational History  . Occupation: retired (Land    Employer: RETIRED  Social Needs  . Financial resource strain: Not on file  . Food insecurity:    Worry: Not on file    Inability: Not on file  . Transportation needs:    Medical: Not on file    Non-medical: Not on file  Tobacco Use  . Smoking status: Former Smoker    Types: Cigarettes    Last attempt to quit: 01/18/2010    Years since quitting: 8.1  . Smokeless tobacco: Never Used  . Tobacco comment: Smoked for 25 years- quit September 2011.  Passive exposure from wife  Substance and Sexual Activity  . Alcohol use: Yes    Alcohol/week: 1.0 - 2.0 standard drinks    Types: 1 - 2 Glasses of wine per week    Comment: Stopped drinking 1-2 glasses of wine daily in 2017   . Drug use: No  . Sexual activity: Not Currently  Lifestyle  . Physical activity:    Days per week: Not on file    Minutes per session: Not on file  . Stress: Not on file  Relationships  .  Social connections:    Talks on phone: Not on file    Gets together: Not on file    Attends religious service: Not on file    Active member of club or organization: Not on file    Attends meetings of clubs or organizations: Not on file    Relationship status: Not on file  Other Topics Concern  . Not on file  Social History Narrative   Separated from wife (2014) and moved to [place]. Retired- worked in Theatre manager.    Past Surgical History:  Procedure Laterality Date  . CHOLECYSTECTOMY    . COLONOSCOPY  02/2012   Dr. Deatra Ina: sigmoid, transverse, and ascending colon diverticulosis  . FINGER SURGERY Right   . prostate procedure  2017   per patient, was having trouble urinating   . TONSILLECTOMY    . 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 Lorre Nick)  . TOTAL KNEE ARTHROPLASTY Right 06/19/2015   Procedure: TOTAL KNEE ARTHROPLASTY;  Surgeon: Vickey Huger, MD;  Location: [facility];  Service:  Orthopedics;  Laterality: Right;   Past Medical History:  Diagnosis Date  . Allergic rhinitis   . Arthritis   . Asthma    seasonal per pt  . Bilateral knee pain    OA  . Diverticulosis 02/2012   seen on colonoscopy  . Erectile dysfunction   . GERD (gastroesophageal reflux disease)   . Heart murmur   . Hiatal hernia   . HLD (hyperlipidemia)   . HTN (hypertension)   . Hydronephrosis determined by ultrasound 08/22/2015  . Overactive bladder   . Prediabetes   . Ringing of ears   . Sleep apnea   . Urinary frequency   . Urinary urgency   . Wears glasses    BP 116/64   Pulse 74   Ht 6' (1.829 m)   Wt 255 lb 12.8 oz (116 kg)   SpO2 93%   BMI 34.69 kg/m   Opioid Risk Score:   Fall Risk Score:  `1  Depression screen PHQ 2/9  Depression screen PHQ 2/9 03/19/2018  Decreased Interest 0  Down, Depressed, Hopeless 0  PHQ - 2 Score 0    Review of Systems  Constitutional: Negative.   HENT: Negative.   Eyes: Negative.   Respiratory: Positive for apnea.   Cardiovascular: Negative.   Gastrointestinal: Negative.   Endocrine: Negative.   Genitourinary: Negative.   Musculoskeletal: Negative.   Skin: Negative.   Allergic/Immunologic: Negative.   Neurological: Negative.   Hematological: Negative.   Psychiatric/Behavioral: Negative.   All other systems reviewed and are negative.      Objective:   Physical Exam  Constitutional: He is oriented to person, place, and time. He appears well-developed and well-nourished.  HENT:  Head: Atraumatic.  Eyes: Pupils are equal, round, and reactive to light. EOM are normal.  Cardiovascular: Normal rate, regular rhythm and normal heart sounds.  Pulmonary/Chest: Effort normal and breath sounds normal.  Musculoskeletal: Normal range of motion.  Neurological: He is alert and oriented to person, place, and time.  Nursing note and vitals reviewed.  Motor strength is 5/5 bilateral hip flexor knee extensor ankle dorsiflexor Right knee  well-healed incision no tenderness palpation along the medial lateral joint lines.  No tenderness along the quadriceps or patellar tendons. Right knee range of motion full extension flexion to 120 degrees Left knee no evidence of effusion there is no tenderness palpation along the medial met lateral joint line no tenderness along the quadricep or patellar  tendon.  He has full extension and flexion to 120 degrees  Relates with cane, antalgic gait decreased stance phase on left       Assessment & Plan:  1.  Chronic postoperative pain, left knee status post TKR.  Patient not pursuing chronic opiate therapy, discussed other treatment options. Would recommend left knee genicular nerve blocks under fluoroscopic guidance.  If greater than 50% relief that is temporary would repeat to lower false positive rate and if 2 sets of blocks produce greater than 50% relief would recommend left genicular radiofrequency neurotomy.  Discussed this with the patient using diagrams he agrees with plan.  We will schedule

## 2018-04-14 ENCOUNTER — Encounter: Payer: Self-pay | Admitting: Physical Medicine & Rehabilitation

## 2018-04-14 ENCOUNTER — Encounter: Payer: No Typology Code available for payment source | Attending: Physical Medicine & Rehabilitation

## 2018-04-14 ENCOUNTER — Ambulatory Visit (HOSPITAL_BASED_OUTPATIENT_CLINIC_OR_DEPARTMENT_OTHER): Payer: No Typology Code available for payment source | Admitting: Physical Medicine & Rehabilitation

## 2018-04-14 VITALS — BP 116/64 | HR 60 | Ht 72.0 in | Wt 247.0 lb

## 2018-04-14 DIAGNOSIS — G8929 Other chronic pain: Secondary | ICD-10-CM

## 2018-04-14 DIAGNOSIS — K219 Gastro-esophageal reflux disease without esophagitis: Secondary | ICD-10-CM | POA: Insufficient documentation

## 2018-04-14 DIAGNOSIS — Z87891 Personal history of nicotine dependence: Secondary | ICD-10-CM | POA: Diagnosis not present

## 2018-04-14 DIAGNOSIS — M170 Bilateral primary osteoarthritis of knee: Secondary | ICD-10-CM | POA: Diagnosis not present

## 2018-04-14 DIAGNOSIS — R7303 Prediabetes: Secondary | ICD-10-CM | POA: Insufficient documentation

## 2018-04-14 DIAGNOSIS — E785 Hyperlipidemia, unspecified: Secondary | ICD-10-CM | POA: Diagnosis not present

## 2018-04-14 DIAGNOSIS — I10 Essential (primary) hypertension: Secondary | ICD-10-CM | POA: Diagnosis not present

## 2018-04-14 DIAGNOSIS — G4730 Sleep apnea, unspecified: Secondary | ICD-10-CM | POA: Diagnosis not present

## 2018-04-14 DIAGNOSIS — M25569 Pain in unspecified knee: Secondary | ICD-10-CM

## 2018-04-14 DIAGNOSIS — M25562 Pain in left knee: Secondary | ICD-10-CM | POA: Insufficient documentation

## 2018-04-14 DIAGNOSIS — J45909 Unspecified asthma, uncomplicated: Secondary | ICD-10-CM | POA: Diagnosis not present

## 2018-04-14 NOTE — Progress Notes (Signed)
  PROCEDURE RECORD [facility]   Name: Gary Carroll DOB:Feb 24, 1946 MRN: [id number]  Date:04/14/2018  Physician: Alysia Penna, MD    Nurse/CMA: Sevrin Sally CMA / Truman Hayward CMA  Allergies:  Allergies  Allergen Reactions  . Diovan [Valsartan] Cough  . Tagamet [Cimetidine] Other (See Comments)    irrittability    Consent Signed: Yes.    Is patient diabetic? No.  CBG today? NA  Pregnant: No. LMP: No LMP for male patient. (age 68-55)  Anticoagulants: yes (eliquis - this AM) Anti-inflammatory: no Antibiotics: no  Procedure: Left knee Genicular injections   Position: Supine   Start Time: 11:55am End Time: 12:06pm Fluoro Time: 43  RN/CMA Avyana Puffenbarger CMA Lee, CMA    Time 1108am 12:12pm    BP 116/64 165/71    Pulse 60 58    Respirations 16 16    O2 Sat 96 93    S/S 6 6    Pain Level 3/10 3/10     D/C home with Self, patient A & O X 3, D/C instructions reviewed, and sits independently.

## 2018-04-14 NOTE — Progress Notes (Signed)
Genicular nerve block x 3, LEFT Upper medial, Upper lateral , and Lower Medial under fluoroscopic guidance  Indication Chronic post operative pain in the Knee, pain postop total knee replacement which has not responded to conservative management such as physical therapy and medication management  Informed consent was obtained after describing risks and to the procedure to the patient these include bleeding bruising and infection, patient elects to proceed and has given written consent. Patient placed supine on the fluoroscopy table AP images of the knee joint were obtained. A 25-gauge 1.5 inch needle was used to anesthetize the skin and subcutaneous tissue with 1% lidocaine, 1 cc at each of 3 locations. Then a 22-gauge 3.5" spinal needle was inserted targeting the junction of the medial flare of the tibia with the shaft of the tibia, bone contact made and confirmed with lateral imaging. Then Omnipaque 180 x0.5 mL demonstrated no intravascular uptake followed by injection of 0.4ml .25% bupivacaine. Then the junction of the medial epicondyles of the femur with the femoral shaft was targeted needle was advanced under fluoroscopic guidance until bone contact. Appropriate depth was obtained and confirmed with lateral images. Then Isovue 200 x0.5 mL demonstrated no intravascular uptake followed by injection of 0.39ml of .25% bupivacaine. Then the junction of the lateral femoral condyle with the femoral shaft was targeted. 22-gauge 3.5 inch needle was advanced under fluoroscopic guidance until bone contact. Appropriate depth was confirmed with lateral imaging. 0.5 mL of Isovue 200 injected followed by injection of 0.5 cc of .25% bupivacaine solution. Patient tolerated procedure well. Post procedure instructions given

## 2018-04-29 DIAGNOSIS — R7301 Impaired fasting glucose: Secondary | ICD-10-CM | POA: Diagnosis not present

## 2018-04-29 DIAGNOSIS — L03116 Cellulitis of left lower limb: Secondary | ICD-10-CM | POA: Diagnosis not present

## 2018-04-29 DIAGNOSIS — J3089 Other allergic rhinitis: Secondary | ICD-10-CM | POA: Diagnosis not present

## 2018-04-29 DIAGNOSIS — I872 Venous insufficiency (chronic) (peripheral): Secondary | ICD-10-CM | POA: Diagnosis not present

## 2018-04-29 DIAGNOSIS — G4730 Sleep apnea, unspecified: Secondary | ICD-10-CM | POA: Diagnosis not present

## 2018-04-29 DIAGNOSIS — N390 Urinary tract infection, site not specified: Secondary | ICD-10-CM | POA: Diagnosis not present

## 2018-04-29 DIAGNOSIS — I482 Chronic atrial fibrillation, unspecified: Secondary | ICD-10-CM | POA: Diagnosis not present

## 2018-04-29 DIAGNOSIS — M791 Myalgia, unspecified site: Secondary | ICD-10-CM | POA: Diagnosis not present

## 2018-04-29 DIAGNOSIS — L03039 Cellulitis of unspecified toe: Secondary | ICD-10-CM | POA: Diagnosis not present

## 2018-04-29 DIAGNOSIS — G9009 Other idiopathic peripheral autonomic neuropathy: Secondary | ICD-10-CM | POA: Diagnosis not present

## 2018-04-29 DIAGNOSIS — J019 Acute sinusitis, unspecified: Secondary | ICD-10-CM | POA: Diagnosis not present

## 2018-04-29 DIAGNOSIS — E119 Type 2 diabetes mellitus without complications: Secondary | ICD-10-CM | POA: Diagnosis not present

## 2018-05-04 DIAGNOSIS — R7301 Impaired fasting glucose: Secondary | ICD-10-CM | POA: Diagnosis not present

## 2018-05-04 DIAGNOSIS — E119 Type 2 diabetes mellitus without complications: Secondary | ICD-10-CM | POA: Diagnosis not present

## 2018-05-04 DIAGNOSIS — E1169 Type 2 diabetes mellitus with other specified complication: Secondary | ICD-10-CM | POA: Diagnosis not present

## 2018-05-04 DIAGNOSIS — E782 Mixed hyperlipidemia: Secondary | ICD-10-CM | POA: Diagnosis not present

## 2018-05-04 DIAGNOSIS — M810 Age-related osteoporosis without current pathological fracture: Secondary | ICD-10-CM | POA: Diagnosis not present

## 2018-05-04 DIAGNOSIS — D509 Iron deficiency anemia, unspecified: Secondary | ICD-10-CM | POA: Diagnosis not present

## 2018-05-04 DIAGNOSIS — I10 Essential (primary) hypertension: Secondary | ICD-10-CM | POA: Diagnosis not present

## 2018-05-07 DIAGNOSIS — E119 Type 2 diabetes mellitus without complications: Secondary | ICD-10-CM | POA: Diagnosis not present

## 2018-05-07 DIAGNOSIS — M1711 Unilateral primary osteoarthritis, right knee: Secondary | ICD-10-CM | POA: Diagnosis not present

## 2018-05-07 DIAGNOSIS — D509 Iron deficiency anemia, unspecified: Secondary | ICD-10-CM | POA: Diagnosis not present

## 2018-05-07 DIAGNOSIS — G4730 Sleep apnea, unspecified: Secondary | ICD-10-CM | POA: Diagnosis not present

## 2018-05-07 DIAGNOSIS — R944 Abnormal results of kidney function studies: Secondary | ICD-10-CM | POA: Diagnosis not present

## 2018-05-07 DIAGNOSIS — R600 Localized edema: Secondary | ICD-10-CM | POA: Diagnosis not present

## 2018-05-07 DIAGNOSIS — I482 Chronic atrial fibrillation, unspecified: Secondary | ICD-10-CM | POA: Diagnosis not present

## 2018-05-07 DIAGNOSIS — R7301 Impaired fasting glucose: Secondary | ICD-10-CM | POA: Diagnosis not present

## 2018-05-07 DIAGNOSIS — I10 Essential (primary) hypertension: Secondary | ICD-10-CM | POA: Diagnosis not present

## 2018-05-07 DIAGNOSIS — E782 Mixed hyperlipidemia: Secondary | ICD-10-CM | POA: Diagnosis not present

## 2018-05-11 ENCOUNTER — Encounter: Payer: No Typology Code available for payment source | Attending: Physical Medicine & Rehabilitation

## 2018-05-11 ENCOUNTER — Ambulatory Visit (HOSPITAL_BASED_OUTPATIENT_CLINIC_OR_DEPARTMENT_OTHER): Payer: No Typology Code available for payment source | Admitting: Physical Medicine & Rehabilitation

## 2018-05-11 VITALS — BP 145/63 | HR 76 | Ht 72.0 in | Wt 254.8 lb

## 2018-05-11 DIAGNOSIS — M25562 Pain in left knee: Secondary | ICD-10-CM | POA: Diagnosis present

## 2018-05-11 DIAGNOSIS — Z87891 Personal history of nicotine dependence: Secondary | ICD-10-CM | POA: Insufficient documentation

## 2018-05-11 DIAGNOSIS — I10 Essential (primary) hypertension: Secondary | ICD-10-CM | POA: Diagnosis not present

## 2018-05-11 DIAGNOSIS — G8928 Other chronic postprocedural pain: Secondary | ICD-10-CM | POA: Diagnosis not present

## 2018-05-11 DIAGNOSIS — E785 Hyperlipidemia, unspecified: Secondary | ICD-10-CM | POA: Diagnosis not present

## 2018-05-11 DIAGNOSIS — M170 Bilateral primary osteoarthritis of knee: Secondary | ICD-10-CM | POA: Insufficient documentation

## 2018-05-11 DIAGNOSIS — K219 Gastro-esophageal reflux disease without esophagitis: Secondary | ICD-10-CM | POA: Insufficient documentation

## 2018-05-11 DIAGNOSIS — R7303 Prediabetes: Secondary | ICD-10-CM | POA: Diagnosis not present

## 2018-05-11 DIAGNOSIS — G4730 Sleep apnea, unspecified: Secondary | ICD-10-CM | POA: Insufficient documentation

## 2018-05-11 DIAGNOSIS — J45909 Unspecified asthma, uncomplicated: Secondary | ICD-10-CM | POA: Diagnosis not present

## 2018-05-11 NOTE — Patient Instructions (Signed)
Genicular nerve block with marcaine and celestone performed today

## 2018-05-11 NOTE — Progress Notes (Signed)
Genicular nerve block x 3, LEFT Upper medial, Upper lateral , and Lower Medial under fluoroscopic guidance  Indication Chronic post operative pain in the Knee, pain postop total knee replacement which has not responded to conservative management such as physical therapy and medication management  Informed consent was obtained after describing risks and to the procedure to the patient these include bleeding bruising and infection, patient elects to proceed and has given written consent. Patient placed supine on the fluoroscopy table AP images of the knee joint were obtained. A 25-gauge 1.5 inch needle was used to anesthetize the skin and subcutaneous tissue with 1% lidocaine, 1 cc at each of 3 locations. Then a 22-gauge 3.5" spinal needle was inserted targeting the junction of the medial flare of the tibia with the shaft of the tibia, bone contact made and confirmed with lateral imaging. Then Omnipaque 180 x0.5 mL demonstrated no intravascular uptake followed by injection of 0.31ml .25% bupivacaine. Then the junction of the medial epicondyles of the femur with the femoral shaft was targeted needle was advanced under fluoroscopic guidance until bone contact. Appropriate depth was obtained and confirmed with lateral images. Then Isovue 200 x0.5 mL demonstrated no intravascular uptake followed by injection of 0.75ml of .25% bupivacaine. Then the junction of the lateral femoral condyle with the femoral shaft was targeted. 22-gauge 3.5 inch needle was advanced under fluoroscopic guidance until bone contact. Appropriate depth was confirmed with lateral imaging. 0.5 mL of Isovue 200 injected followed by injection of 0.5 cc of .25% bupivacaine solution. Patient tolerated procedure well. Post procedure instructions given

## 2018-05-11 NOTE — Progress Notes (Signed)
  PROCEDURE RECORD [facility]   Name: SONAM HUELSMANN DOB:December 04, 1945 MRN: [id number]  Date:05/11/2018  Physician: Alysia Penna, MD    Nurse/CMA: Truman Hayward, CMA  Allergies:  Allergies  Allergen Reactions  . Diovan [Valsartan] Cough  . Tagamet [Cimetidine] Other (See Comments)    irrittability    Consent Signed: Yes.    Is patient diabetic? No.  CBG today? NA  Pregnant: Yes.   LMP: No LMP for male patient. (age 73-55)  Anticoagulants: yes (eliquis, was told not to stop taking for this procedure) Anti-inflammatory: no Antibiotics: no  Procedure: Left Knee Genicular Nerve Block   Position: Prone   Start Time: 1:33pm End Time: 1:45pm Fluoro Time: 43s  RN/CMA Truman Hayward, CMA Bright CMA    Time 12:45 pm 1:50pm    BP 145/63 155/73    Pulse 76 65    Respirations 16 16    O2 Sat 96 97    S/S 6/6 6/6    Pain Level 5/10 1/10     D/C home with self, patient A & O X 3, D/C instructions reviewed, and sits independently.

## 2018-05-22 DIAGNOSIS — L662 Folliculitis decalvans: Secondary | ICD-10-CM | POA: Diagnosis not present

## 2018-05-29 ENCOUNTER — Telehealth: Payer: Self-pay | Admitting: Physical Medicine & Rehabilitation

## 2018-06-09 ENCOUNTER — Ambulatory Visit: Payer: Non-veteran care | Admitting: Physical Medicine & Rehabilitation

## 2018-06-15 IMAGING — CR DG CHEST 1V PORT
1 series · 1 of 1 positions shown · non-contrast
Comparison: September 30, 2017

CLINICAL DATA: Shortness of breath and fever

EXAM:
PORTABLE CHEST 1 VIEW

[portable]
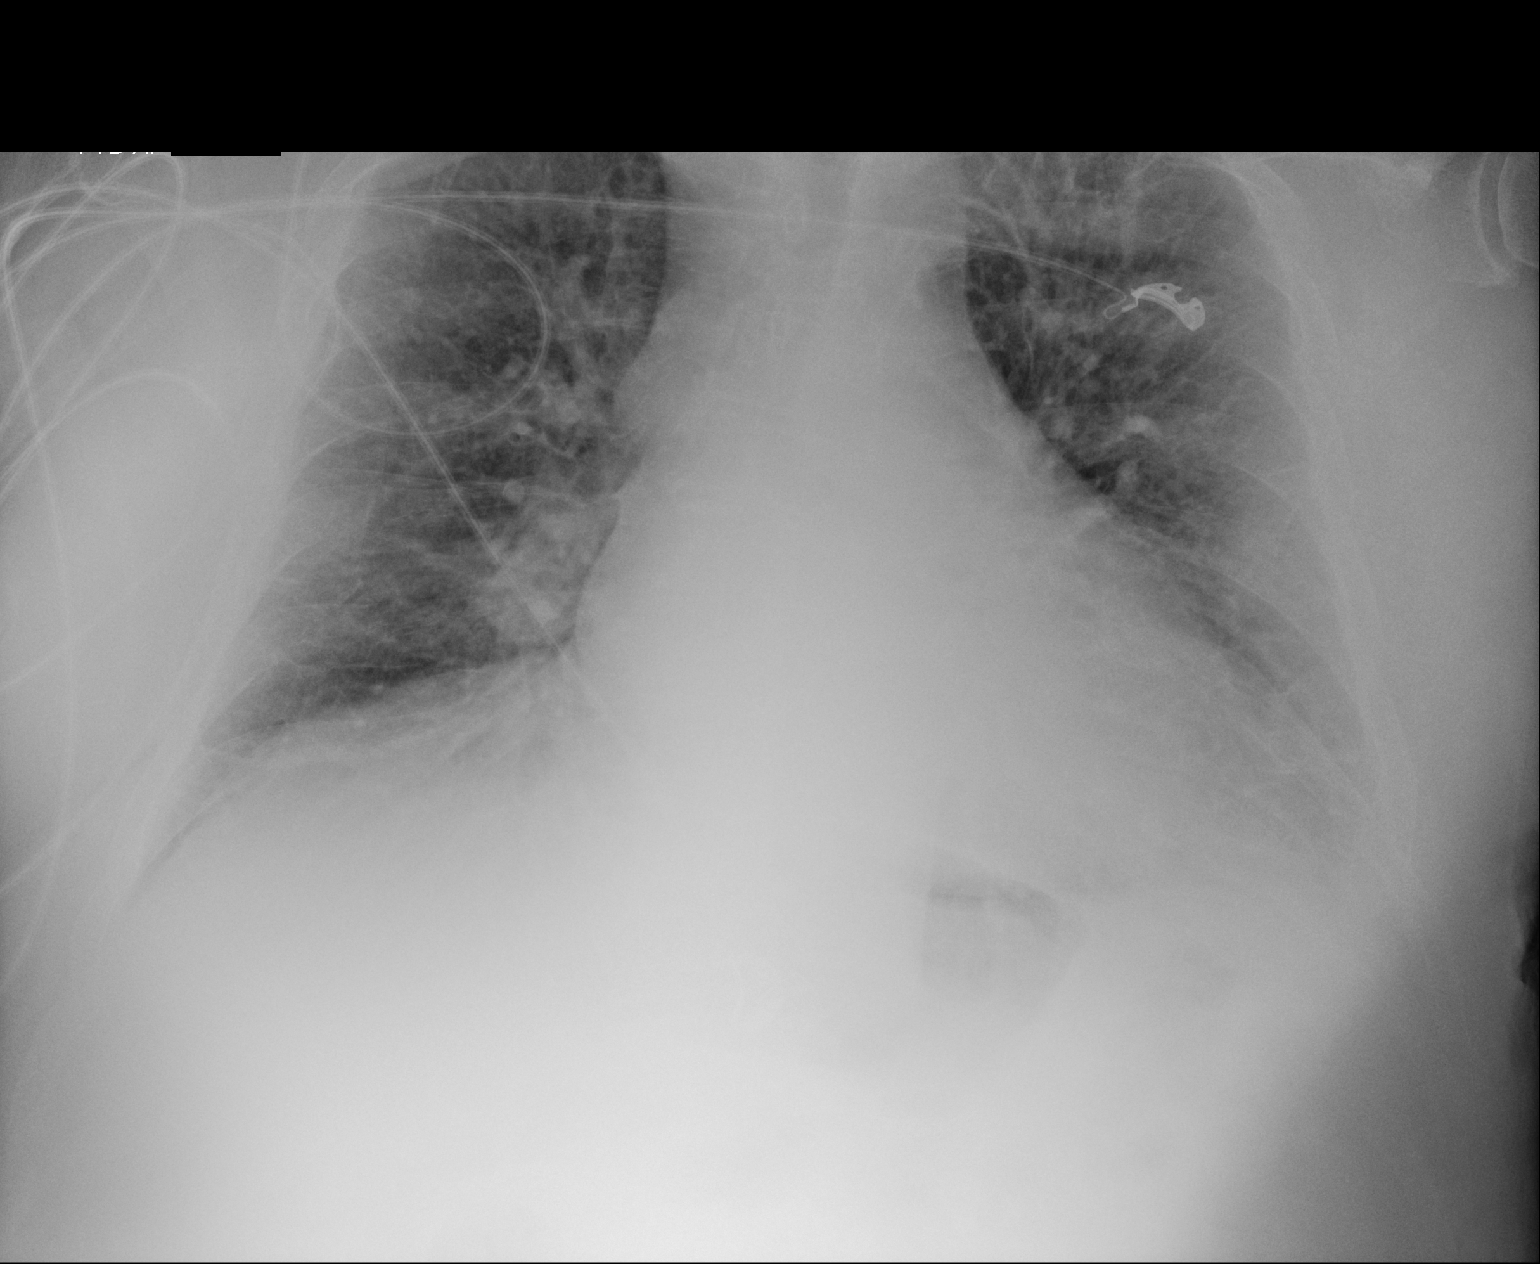

[1 of 1 positions shown; findings below may reference images not displayed]

FINDINGS: There is no edema or consolidation. There is a minimal left pleural
effusion. There is mild cardiac enlargement with pulmonary venous
hypertension. No adenopathy. No bone lesions.
IMPRESSION: Pulmonary vascular congestion. Minimal left pleural effusion. No
frank edema or consolidation.

## 2018-06-16 ENCOUNTER — Ambulatory Visit (HOSPITAL_BASED_OUTPATIENT_CLINIC_OR_DEPARTMENT_OTHER): Payer: No Typology Code available for payment source | Admitting: Physical Medicine & Rehabilitation

## 2018-06-16 ENCOUNTER — Encounter: Payer: No Typology Code available for payment source | Attending: Physical Medicine & Rehabilitation

## 2018-06-16 VITALS — BP 139/67 | HR 63 | Ht 72.0 in | Wt 255.0 lb

## 2018-06-16 DIAGNOSIS — E785 Hyperlipidemia, unspecified: Secondary | ICD-10-CM | POA: Insufficient documentation

## 2018-06-16 DIAGNOSIS — R7303 Prediabetes: Secondary | ICD-10-CM | POA: Insufficient documentation

## 2018-06-16 DIAGNOSIS — K219 Gastro-esophageal reflux disease without esophagitis: Secondary | ICD-10-CM | POA: Insufficient documentation

## 2018-06-16 DIAGNOSIS — I10 Essential (primary) hypertension: Secondary | ICD-10-CM | POA: Insufficient documentation

## 2018-06-16 DIAGNOSIS — G8928 Other chronic postprocedural pain: Secondary | ICD-10-CM

## 2018-06-16 DIAGNOSIS — J45909 Unspecified asthma, uncomplicated: Secondary | ICD-10-CM | POA: Insufficient documentation

## 2018-06-16 DIAGNOSIS — M25562 Pain in left knee: Secondary | ICD-10-CM | POA: Diagnosis present

## 2018-06-16 DIAGNOSIS — G4730 Sleep apnea, unspecified: Secondary | ICD-10-CM | POA: Diagnosis not present

## 2018-06-16 DIAGNOSIS — M170 Bilateral primary osteoarthritis of knee: Secondary | ICD-10-CM | POA: Insufficient documentation

## 2018-06-16 DIAGNOSIS — G8929 Other chronic pain: Secondary | ICD-10-CM

## 2018-06-16 DIAGNOSIS — M25569 Pain in unspecified knee: Secondary | ICD-10-CM

## 2018-06-16 DIAGNOSIS — Z87891 Personal history of nicotine dependence: Secondary | ICD-10-CM | POA: Diagnosis not present

## 2018-06-16 NOTE — Patient Instructions (Addendum)
Genicular nerve radiofrequency neurotomy performed today may repeat as soon as 6 mo if needed.

## 2018-06-16 NOTE — Progress Notes (Signed)
LEFT genicular nerve radiofrequency neurotomy under fluoroscopic guidance  Indication Chronic severe  knee pain that has not responded to PT, medication, and other conservative care.THere has been 50% relief of usual knee pain with each of 2 sets of genicular nerve blocks under fluoroscopic guidance  Informed consent was obtained after discussing risks and benefits of procedure with the patient.  These include bleeding, bruising and infection as well as foot numbness. Pt place in supine position on the fluoro table .  Static images identified distal femur and proximal tibia.  Lateral and medial supracondylar , as well as medial tibial flare prepped with betadine.  Marked under fluoro and then a 25 g 1.5 inch needle was used to anesthetize skin and subcutaneous tissue. 1.5 cc was infiltrate into each of 3 sites. Then a 18-gauge 10cm RF needle with a 1cm curve tip  was inserted under fluoroscopic guidance first targeting the medial tibial flare midpoint. After bone contact was made lateral images confirmed proper positioning at midpoint of shart ,then Motor stim at 2 Hz demonstrated no motor twitch followed by injection of 1ml of 1% lidocaine and radiofrequency lesioning 80 deg for 90 sec. . This same procedure was treated for the lateral and medial supracondylar areas using same equipment and technique. Patient tolerated procedure well. Post procedure instructions given. 

## 2018-06-16 NOTE — Progress Notes (Signed)
  PROCEDURE RECORD [facility]   Name: Gary Carroll DOB:1945-10-25 MRN: [id number]  Date:06/16/2018  Physician: Alysia Penna, MD    Nurse/CMA: Truman Hayward, CMA  Allergies:  Allergies  Allergen Reactions  . Diovan [Valsartan] Cough  . Tagamet [Cimetidine] Other (See Comments)    irrittability    Consent Signed: Yes.    Is patient diabetic? No.  CBG today?   Pregnant: No. LMP: No LMP for male patient. (age 74-55)  Anticoagulants: yes (Eliquis) Anti-inflammatory: no Antibiotics: no  Procedure: Left Genicular Nerve Radiofrequency Position: Prone Start Time: 2:40pm End Time: 3:08pm Fluoro Time: 72  RN/CMA Truman Hayward, CMA Shara Hartis, CMA    Time 2:32pm 3:15pm    BP 139/67 165/69    Pulse 63 65    Respirations 16 16    O2 Sat 92 96    S/S 6 6    Pain Level 4/10 3/10     D/C home with no one, patient A & O X 3, D/C instructions reviewed, and sits independently.

## 2018-06-17 IMAGING — CR DG CHEST 1V PORT
1 series · 1 of 1 positions shown · non-contrast
Comparison: Yesterday

CLINICAL DATA: Pulmonary edema

EXAM:
PORTABLE CHEST 1 VIEW

[portable]
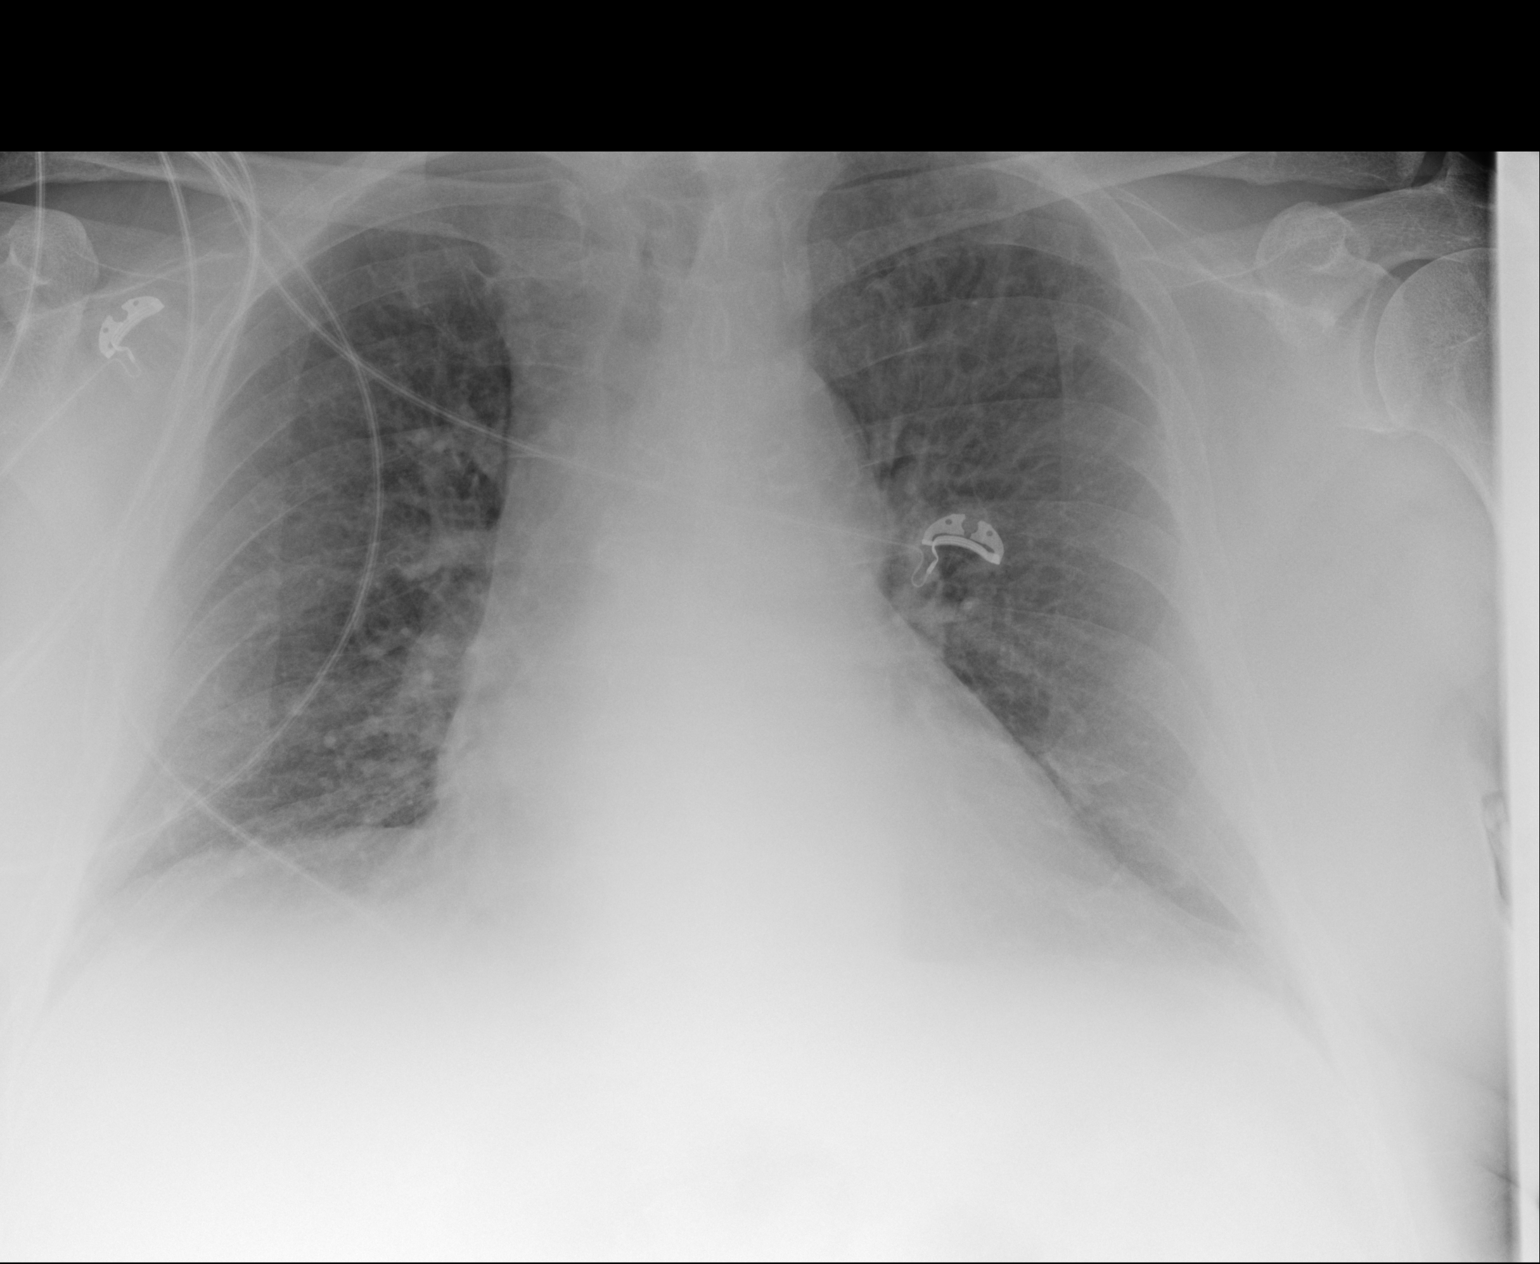

[1 of 1 positions shown; findings below may reference images not displayed]

FINDINGS: Normal heart size when accounting for fat pad. Stable vascular
pedicle widening. Low volumes with generalized interstitial
prominence. No Kerley lines or air bronchogram. No effusion or
pneumothorax.
IMPRESSION: 1. Stable from yesterday.
2. Low volumes and vascular congestion.

## 2018-06-26 DIAGNOSIS — E782 Mixed hyperlipidemia: Secondary | ICD-10-CM | POA: Diagnosis not present

## 2018-06-26 DIAGNOSIS — R300 Dysuria: Secondary | ICD-10-CM | POA: Diagnosis not present

## 2018-06-26 DIAGNOSIS — E1169 Type 2 diabetes mellitus with other specified complication: Secondary | ICD-10-CM | POA: Diagnosis not present

## 2018-06-26 DIAGNOSIS — G4730 Sleep apnea, unspecified: Secondary | ICD-10-CM | POA: Diagnosis not present

## 2018-06-26 DIAGNOSIS — R339 Retention of urine, unspecified: Secondary | ICD-10-CM | POA: Diagnosis not present

## 2018-06-26 DIAGNOSIS — D509 Iron deficiency anemia, unspecified: Secondary | ICD-10-CM | POA: Diagnosis not present

## 2018-06-26 DIAGNOSIS — I10 Essential (primary) hypertension: Secondary | ICD-10-CM | POA: Diagnosis not present

## 2018-06-26 DIAGNOSIS — I872 Venous insufficiency (chronic) (peripheral): Secondary | ICD-10-CM | POA: Diagnosis not present

## 2018-06-26 DIAGNOSIS — N400 Enlarged prostate without lower urinary tract symptoms: Secondary | ICD-10-CM | POA: Diagnosis not present

## 2018-06-26 DIAGNOSIS — N390 Urinary tract infection, site not specified: Secondary | ICD-10-CM | POA: Diagnosis not present

## 2018-06-26 DIAGNOSIS — I482 Chronic atrial fibrillation, unspecified: Secondary | ICD-10-CM | POA: Diagnosis not present

## 2018-06-26 DIAGNOSIS — E119 Type 2 diabetes mellitus without complications: Secondary | ICD-10-CM | POA: Diagnosis not present

## 2018-06-26 DIAGNOSIS — G9009 Other idiopathic peripheral autonomic neuropathy: Secondary | ICD-10-CM | POA: Diagnosis not present

## 2018-07-10 ENCOUNTER — Other Ambulatory Visit: Payer: Self-pay | Admitting: Adult Health Nurse Practitioner

## 2018-07-28 DIAGNOSIS — J069 Acute upper respiratory infection, unspecified: Secondary | ICD-10-CM | POA: Diagnosis not present

## 2018-08-27 DIAGNOSIS — R21 Rash and other nonspecific skin eruption: Secondary | ICD-10-CM | POA: Diagnosis not present

## 2018-09-02 DIAGNOSIS — Z0000 Encounter for general adult medical examination without abnormal findings: Secondary | ICD-10-CM | POA: Diagnosis not present

## 2018-10-08 DIAGNOSIS — H02849 Edema of unspecified eye, unspecified eyelid: Secondary | ICD-10-CM | POA: Diagnosis not present

## 2018-10-08 DIAGNOSIS — H1033 Unspecified acute conjunctivitis, bilateral: Secondary | ICD-10-CM | POA: Diagnosis not present

## 2018-11-04 DIAGNOSIS — R918 Other nonspecific abnormal finding of lung field: Secondary | ICD-10-CM | POA: Diagnosis not present

## 2018-11-10 DIAGNOSIS — E782 Mixed hyperlipidemia: Secondary | ICD-10-CM | POA: Diagnosis not present

## 2018-11-10 DIAGNOSIS — D509 Iron deficiency anemia, unspecified: Secondary | ICD-10-CM | POA: Diagnosis not present

## 2018-11-10 DIAGNOSIS — R7301 Impaired fasting glucose: Secondary | ICD-10-CM | POA: Diagnosis not present

## 2018-11-10 DIAGNOSIS — E1169 Type 2 diabetes mellitus with other specified complication: Secondary | ICD-10-CM | POA: Diagnosis not present

## 2018-11-10 DIAGNOSIS — I10 Essential (primary) hypertension: Secondary | ICD-10-CM | POA: Diagnosis not present

## 2018-11-16 ENCOUNTER — Ambulatory Visit: Payer: Non-veteran care | Admitting: Physical Medicine & Rehabilitation

## 2018-11-19 DIAGNOSIS — E782 Mixed hyperlipidemia: Secondary | ICD-10-CM | POA: Diagnosis not present

## 2018-11-19 DIAGNOSIS — M810 Age-related osteoporosis without current pathological fracture: Secondary | ICD-10-CM | POA: Diagnosis not present

## 2018-11-19 DIAGNOSIS — R600 Localized edema: Secondary | ICD-10-CM | POA: Diagnosis not present

## 2018-11-19 DIAGNOSIS — I482 Chronic atrial fibrillation, unspecified: Secondary | ICD-10-CM | POA: Diagnosis not present

## 2018-11-19 DIAGNOSIS — D649 Anemia, unspecified: Secondary | ICD-10-CM | POA: Diagnosis not present

## 2018-11-19 DIAGNOSIS — G4730 Sleep apnea, unspecified: Secondary | ICD-10-CM | POA: Diagnosis not present

## 2018-11-19 DIAGNOSIS — R944 Abnormal results of kidney function studies: Secondary | ICD-10-CM | POA: Diagnosis not present

## 2018-11-19 DIAGNOSIS — I10 Essential (primary) hypertension: Secondary | ICD-10-CM | POA: Diagnosis not present

## 2018-11-19 DIAGNOSIS — E1169 Type 2 diabetes mellitus with other specified complication: Secondary | ICD-10-CM | POA: Diagnosis not present

## 2018-11-19 DIAGNOSIS — R339 Retention of urine, unspecified: Secondary | ICD-10-CM | POA: Diagnosis not present

## 2018-11-23 ENCOUNTER — Encounter: Payer: Self-pay | Admitting: Physical Medicine & Rehabilitation

## 2018-11-23 ENCOUNTER — Other Ambulatory Visit: Payer: Self-pay

## 2018-11-23 ENCOUNTER — Encounter
Payer: No Typology Code available for payment source | Attending: Physical Medicine & Rehabilitation | Admitting: Physical Medicine & Rehabilitation

## 2018-11-23 VITALS — BP 150/63 | HR 65 | Ht 72.0 in | Wt 268.0 lb

## 2018-11-23 DIAGNOSIS — Z96653 Presence of artificial knee joint, bilateral: Secondary | ICD-10-CM | POA: Diagnosis not present

## 2018-11-23 DIAGNOSIS — G8929 Other chronic pain: Secondary | ICD-10-CM | POA: Insufficient documentation

## 2018-11-23 DIAGNOSIS — M25569 Pain in unspecified knee: Secondary | ICD-10-CM | POA: Diagnosis not present

## 2018-11-23 DIAGNOSIS — M25562 Pain in left knee: Secondary | ICD-10-CM | POA: Insufficient documentation

## 2018-11-23 DIAGNOSIS — G8928 Other chronic postprocedural pain: Secondary | ICD-10-CM

## 2018-11-23 NOTE — Progress Notes (Signed)
Subjective:    Patient ID: Gary Carroll, male    DOB: [birth year], 73 y.o.   MRN: [id number] 06/16/2018 LEFT genicular nerve radiofrequency neurotomy under fluoroscopic guidance HPI Left knee pain relieved by Left genicular radiofrequency neurotomy.  Pain during ambulation, tolerance is ~23minutes Pain started worsening in the left knee ~2-3 wks ago, at rest it is 0/10 and with ambulation it is 5/10  Right knee pain occurs intermittently , mild   Started swimming again June 3rd and has been swimming almost every day, has not been doing stationary bicycling due to gym closure   pain Inventory Average Pain 5 Pain Right Now 0 My pain is dull and aching  In the last 24 hours, has pain interfered with the following? General activity 4 Relation with others 4 Enjoyment of life 4 What TIME of day is your pain at its worst? daytime Sleep (in general) Poor  Pain is worse with: walking and some activites Pain improves with: rest Relief from Meds: .  Mobility use a cane ability to climb steps?  yes do you drive?  yes  Function retired  Neuro/Psych bladder control problems trouble walking depression  Prior Studies Any changes since last visit?  no  Physicians involved in your care Any changes since last visit?  no   Family History  Problem Relation Age of Onset  . Uterine cancer Mother   . Cancer Mother        uterine  . Coronary artery disease Father   . Coronary artery disease Paternal Grandfather   . Heart disease Paternal Grandfather   . Colon cancer Neg Hx   . Esophageal cancer Neg Hx   . Stomach cancer Neg Hx   . Rectal cancer Neg Hx   . Liver disease Neg Hx    Social History   Socioeconomic History  . Marital status: Divorced    Spouse name: Not on file  . Number of children: 0  . Years of education: Not on file  . Highest education level: Not on file  Occupational History  . Occupation: retired Land)    Employer: RETIRED  Social Needs   . Financial resource strain: Not on file  . Food insecurity    Worry: Not on file    Inability: Not on file  . Transportation needs    Medical: Not on file    Non-medical: Not on file  Tobacco Use  . Smoking status: Former Smoker    Types: Cigarettes    Quit date: 01/18/2010    Years since quitting: 8.8  . Smokeless tobacco: Never Used  . Tobacco comment: Smoked for 25 years- quit September 2011.  Passive exposure from wife  Substance and Sexual Activity  . Alcohol use: Yes    Alcohol/week: 1.0 - 2.0 standard drinks    Types: 1 - 2 Glasses of wine per week    Comment: Stopped drinking 1-2 glasses of wine daily in 2017   . Drug use: No  . Sexual activity: Not Currently  Lifestyle  . Physical activity    Days per week: Not on file    Minutes per session: Not on file  . Stress: Not on file  Relationships  . Social Herbalist on phone: Not on file    Gets together: Not on file    Attends religious service: Not on file    Active member of club or organization: Not on file    Attends meetings of clubs or organizations:  Not on file    Relationship status: Not on file  Other Topics Concern  . Not on file  Social History Narrative   Separated from wife (2014) and moved to [place]. Retired- worked in Theatre manager.    Past Surgical History:  Procedure Laterality Date  . CHOLECYSTECTOMY    . COLONOSCOPY  02/2012   Dr. Deatra Ina: sigmoid, transverse, and ascending colon diverticulosis  . FINGER SURGERY Right   . prostate procedure  2017   per patient, was having trouble urinating   . TONSILLECTOMY    . 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 Lorre Nick)  . TOTAL KNEE ARTHROPLASTY Right 06/19/2015   Procedure: TOTAL KNEE ARTHROPLASTY;  Surgeon: Vickey Huger, MD;  Location: [facility];  Service: Orthopedics;  Laterality: Right;   Past Medical History:  Diagnosis Date  . Allergic rhinitis   . Arthritis   . Asthma    seasonal per pt  . Bilateral knee pain    OA  .  Diverticulosis 02/2012   seen on colonoscopy  . Erectile dysfunction   . GERD (gastroesophageal reflux disease)   . Heart murmur   . Hiatal hernia   . HLD (hyperlipidemia)   . HTN (hypertension)   . Hydronephrosis determined by ultrasound 08/22/2015  . Overactive bladder   . Prediabetes   . Ringing of ears   . Sleep apnea   . Urinary frequency   . Urinary urgency   . Wears glasses    BP (!) 150/63   Pulse 65   Ht 6' (1.829 m)   Wt 268 lb (121.6 kg)   SpO2 95%   BMI 36.35 kg/m   Opioid Risk Score:   Fall Risk Score:  `1  Depression screen PHQ 2/9  Depression screen PHQ 2/9 03/19/2018  Decreased Interest 0  Down, Depressed, Hopeless 0  PHQ - 2 Score 0     Review of Systems  Constitutional: Negative.   HENT: Positive for rhinorrhea and sinus pain.   Eyes: Negative.   Respiratory: Negative.   Cardiovascular: Negative.   Gastrointestinal: Negative.   Endocrine: Negative.   Genitourinary: Negative.   Musculoskeletal: Positive for arthralgias, back pain, gait problem and myalgias.  Skin: Negative.   Allergic/Immunologic: Negative.   Hematological: Negative.   Psychiatric/Behavioral: Negative.   All other systems reviewed and are negative.      Objective:   Physical Exam Vitals signs and nursing note reviewed.  Constitutional:      Appearance: He is obese.  HENT:     Head: Normocephalic and atraumatic.     Mouth/Throat:     Mouth: Mucous membranes are moist.  Eyes:     Extraocular Movements: Extraocular movements intact.     Conjunctiva/sclera: Conjunctivae normal.     Pupils: Pupils are equal, round, and reactive to light.  Musculoskeletal:        General: No tenderness or deformity.     Comments: Well-healed bilateral knee surgical incisions  Right knee no evidence of effusion or erythema there is no tenderness to palpation  Left knee no evidence of effusion or erythema there is tenderness palpation along the medial joint line  Skin:    General:  Skin is warm and dry.  Neurological:     General: No focal deficit present.     Mental Status: He is alert and oriented to person, place, and time.  Psychiatric:        Mood and Affect: Mood normal.  Assessment & Plan:  1.  Chronic left  postoperative knee pain this occurs in approximately 20% of patients after total knee replacement.  The patient has had good results with genicular nerve block and radiofrequency procedure.  He is approximately 5-1/2 months post procedure and it is starting to wear off.  We discussed that we have to wait till his 102-month mark to repeat. This will be on or after 12/15/2018 We also discussed that his right knee pain is similar in etiology but not as severe and not requiring genicular nerve blocks at this time

## 2018-12-14 ENCOUNTER — Encounter: Payer: Self-pay | Admitting: Physical Medicine & Rehabilitation

## 2018-12-14 ENCOUNTER — Other Ambulatory Visit: Payer: Self-pay

## 2018-12-14 ENCOUNTER — Encounter (HOSPITAL_BASED_OUTPATIENT_CLINIC_OR_DEPARTMENT_OTHER): Payer: No Typology Code available for payment source | Admitting: Physical Medicine & Rehabilitation

## 2018-12-14 DIAGNOSIS — M25562 Pain in left knee: Secondary | ICD-10-CM

## 2018-12-14 DIAGNOSIS — G8929 Other chronic pain: Secondary | ICD-10-CM | POA: Diagnosis not present

## 2018-12-14 DIAGNOSIS — G8928 Other chronic postprocedural pain: Secondary | ICD-10-CM | POA: Diagnosis not present

## 2018-12-14 NOTE — Patient Instructions (Signed)
You had a radiofrequency procedure on the left genicular nerves for chronic knee pain.  This should last for ~60mo

## 2018-12-14 NOTE — Progress Notes (Signed)
  PROCEDURE RECORD [facility]   Name: Gary Carroll DOB:Oct 01, 1945 MRN: [id number]  Date:12/14/2018  Physician: Alysia Penna, MD    Nurse/CMA: Manami Tutor CMA  Allergies:  Allergies  Allergen Reactions  . Diovan [Valsartan] Cough  . Tagamet [Cimetidine] Other (See Comments)    irrittability    Consent Signed: Yes.    Is patient diabetic? No.  CBG today? NA  Pregnant: No. LMP: No LMP for male patient. (age 73-55)  Anticoagulants: yes (eliquis this AM) Anti-inflammatory: no Antibiotics: no  Procedure: left knee genicular rfa Position: Supine   Start Time: 250pm End Time: 305pm Fluoro Time: 31s  RN/CMA [place] CMA Hulet Ehrmann CMA    Time 214pm 312pm    BP 143/64 151/72    Pulse 69 69    Respirations 16 16    O2 Sat 95 95    S/S 6 6    Pain Level 6/10 0/10     D/C home with Self, patient A & O X 3, D/C instructions reviewed, and sits independently.

## 2018-12-14 NOTE — Progress Notes (Signed)
LEFT genicular nerve radiofrequency neurotomy under fluoroscopic guidance  Indication Chronic severe  knee pain that has not responded to PT, medication, and other conservative care.THere has been 50% relief of usual knee pain with each of 2 sets of genicular nerve blocks under fluoroscopic guidance  Informed consent was obtained after discussing risks and benefits of procedure with the patient.  These include bleeding, bruising and infection as well as foot numbness. Pt place in supine position on the fluoro table .  Static images identified distal femur and proximal tibia.  Lateral and medial supracondylar , as well as medial tibial flare prepped with betadine.  Marked under fluoro and then a 25 g 1.5 inch needle was used to anesthetize skin and subcutaneous tissue. 1.5 cc was infiltrate into each of 3 sites. Then a 18-gauge 10cm RF needle with a 1cm curve tip  was inserted under fluoroscopic guidance first targeting the medial tibial flare midpoint. After bone contact was made lateral images confirmed proper positioning at midpoint of shart ,then Motor stim at 2 Hz demonstrated no motor twitch followed by injection of 34ml of 1% lidocaine and radiofrequency lesioning 80 deg for 90 sec. . This same procedure was treated for the lateral and medial supracondylar areas using same equipment and technique. Patient tolerated procedure well. Post procedure instructions given.

## 2018-12-15 ENCOUNTER — Telehealth: Payer: Self-pay | Admitting: Physical Medicine & Rehabilitation

## 2018-12-15 NOTE — Telephone Encounter (Signed)
Patient has a form that wasn't signed on 12/15/18 for his discharge instructions after an injection with Dr. Letta Pate.  I will put this paper up front in brown folder [facility] forms for patients to pick up).

## 2018-12-18 DIAGNOSIS — I10 Essential (primary) hypertension: Secondary | ICD-10-CM | POA: Diagnosis not present

## 2018-12-24 DIAGNOSIS — L218 Other seborrheic dermatitis: Secondary | ICD-10-CM | POA: Diagnosis not present

## 2019-01-01 DIAGNOSIS — H65192 Other acute nonsuppurative otitis media, left ear: Secondary | ICD-10-CM | POA: Diagnosis not present

## 2019-01-01 DIAGNOSIS — H6123 Impacted cerumen, bilateral: Secondary | ICD-10-CM | POA: Diagnosis not present

## 2019-01-01 DIAGNOSIS — I10 Essential (primary) hypertension: Secondary | ICD-10-CM | POA: Diagnosis not present

## 2019-01-25 DIAGNOSIS — R69 Illness, unspecified: Secondary | ICD-10-CM | POA: Diagnosis not present

## 2019-02-19 DIAGNOSIS — R2243 Localized swelling, mass and lump, lower limb, bilateral: Secondary | ICD-10-CM | POA: Diagnosis not present

## 2019-02-24 ENCOUNTER — Encounter: Payer: Self-pay | Admitting: Cardiovascular Disease

## 2019-02-24 ENCOUNTER — Ambulatory Visit: Payer: PRIVATE HEALTH INSURANCE | Admitting: Cardiovascular Disease

## 2019-02-24 ENCOUNTER — Other Ambulatory Visit: Payer: Self-pay

## 2019-02-24 ENCOUNTER — Ambulatory Visit (INDEPENDENT_AMBULATORY_CARE_PROVIDER_SITE_OTHER): Payer: Medicare HMO | Admitting: Cardiovascular Disease

## 2019-02-24 VITALS — BP 122/53 | HR 59 | Temp 97.5°F | Ht 72.0 in | Wt 280.0 lb

## 2019-02-24 DIAGNOSIS — I480 Paroxysmal atrial fibrillation: Secondary | ICD-10-CM

## 2019-02-24 DIAGNOSIS — I5032 Chronic diastolic (congestive) heart failure: Secondary | ICD-10-CM

## 2019-02-24 DIAGNOSIS — I351 Nonrheumatic aortic (valve) insufficiency: Secondary | ICD-10-CM | POA: Diagnosis not present

## 2019-02-24 DIAGNOSIS — I350 Nonrheumatic aortic (valve) stenosis: Secondary | ICD-10-CM | POA: Diagnosis not present

## 2019-02-24 DIAGNOSIS — E782 Mixed hyperlipidemia: Secondary | ICD-10-CM
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
## 2019-02-24 NOTE — Patient Instructions (Signed)

## 2019-02-25 DIAGNOSIS — R2243 Localized swelling, mass and lump, lower limb, bilateral: Secondary | ICD-10-CM | POA: Diagnosis not present

## 2019-03-01 DIAGNOSIS — S31829A Unspecified open wound of left buttock, initial encounter: Secondary | ICD-10-CM | POA: Diagnosis not present

## 2019-03-01 DIAGNOSIS — R2243 Localized swelling, mass and lump, lower limb, bilateral: Secondary | ICD-10-CM | POA: Diagnosis not present

## 2019-03-11 DIAGNOSIS — R2243 Localized swelling, mass and lump, lower limb, bilateral: Secondary | ICD-10-CM | POA: Diagnosis not present

## 2019-03-11 DIAGNOSIS — S31829A Unspecified open wound of left buttock, initial encounter: Secondary | ICD-10-CM | POA: Diagnosis not present

## 2019-03-15 ENCOUNTER — Other Ambulatory Visit (HOSPITAL_COMMUNITY): Payer: PRIVATE HEALTH INSURANCE

## 2019-03-19 IMAGING — US US EXTREM LOW VENOUS*L*
1 series · 13 of 24 positions shown · non-contrast
Comparison: None.

CLINICAL DATA: 71-year-old male with left lower extremity pain



[Series 1: us extrem low venous*left* · 0.10mm/px · 13 of 66 slices shown]
[im 1/66]
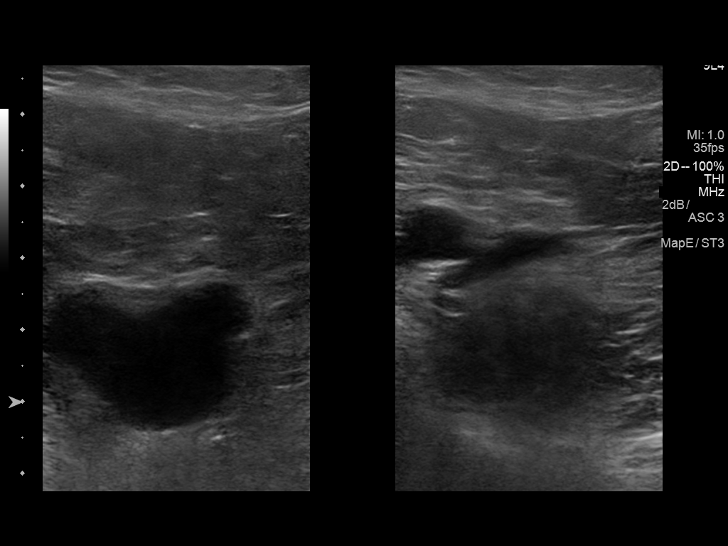
[im 6/66]
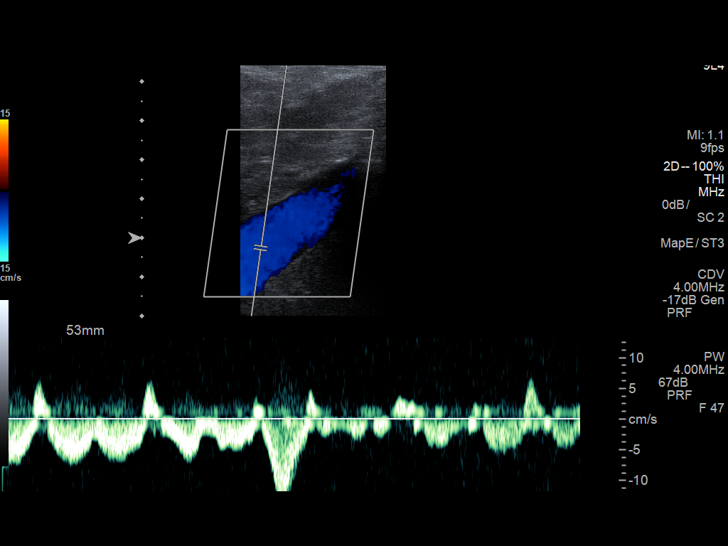
[im 12/66]
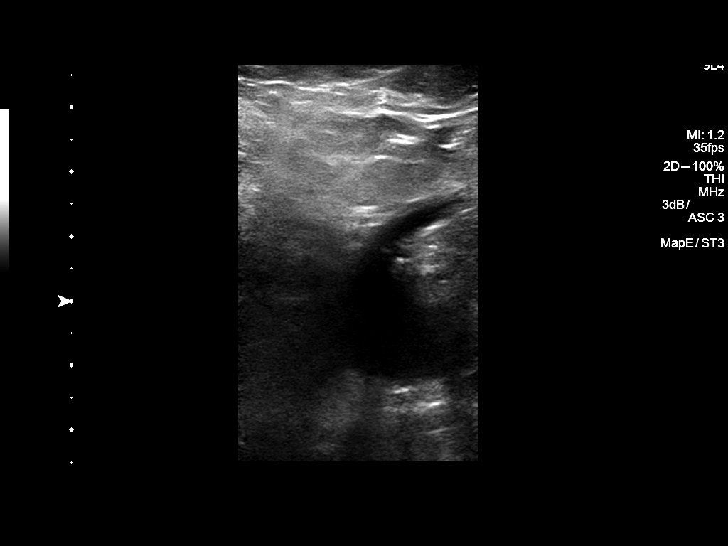
[im 17/66]
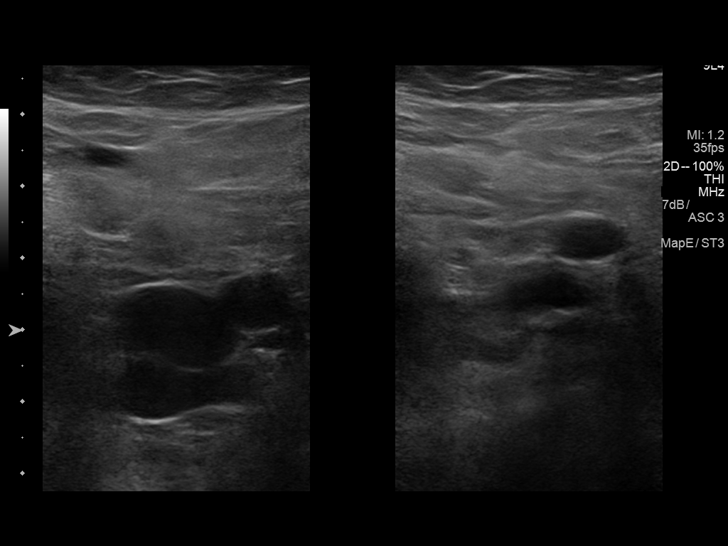
[im 23/66]
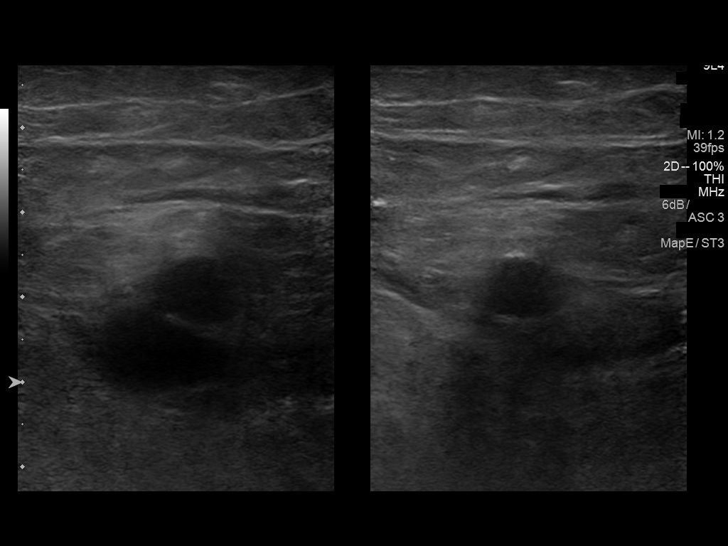
[im 29/66]
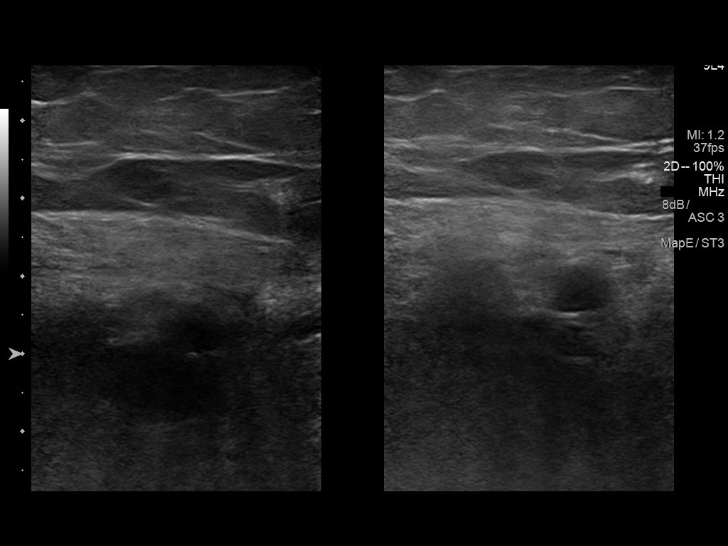
[im 34/66]
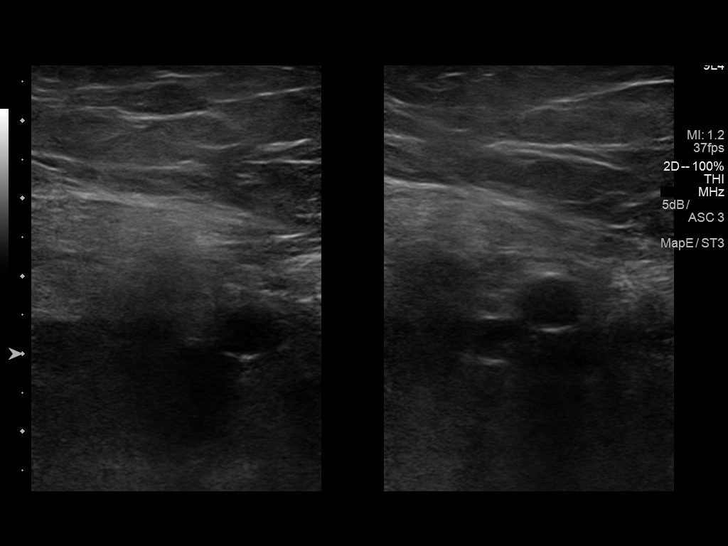
[im 37/66]
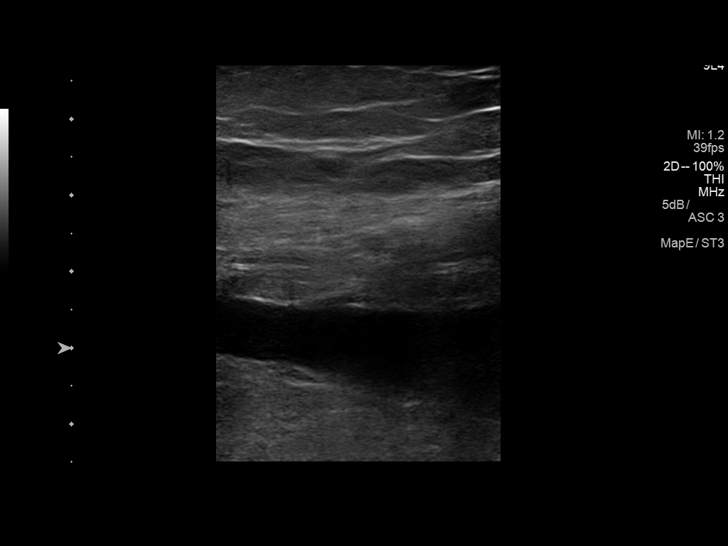
[im 43/66]
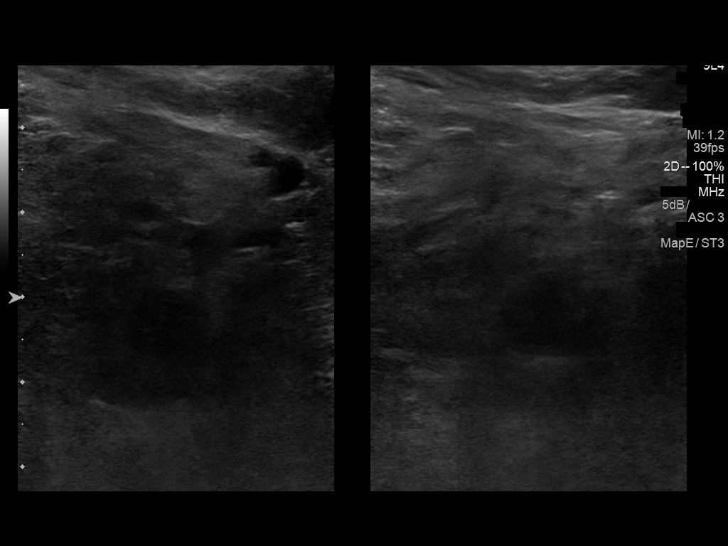
[im 49/66]
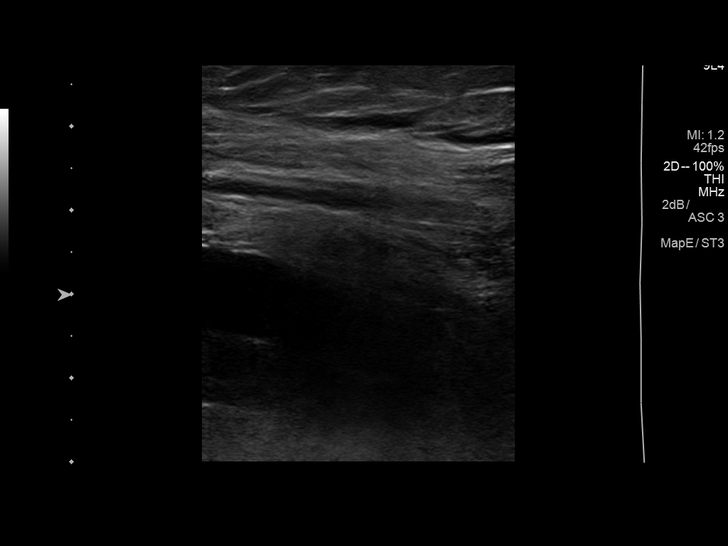
[im 54/66]
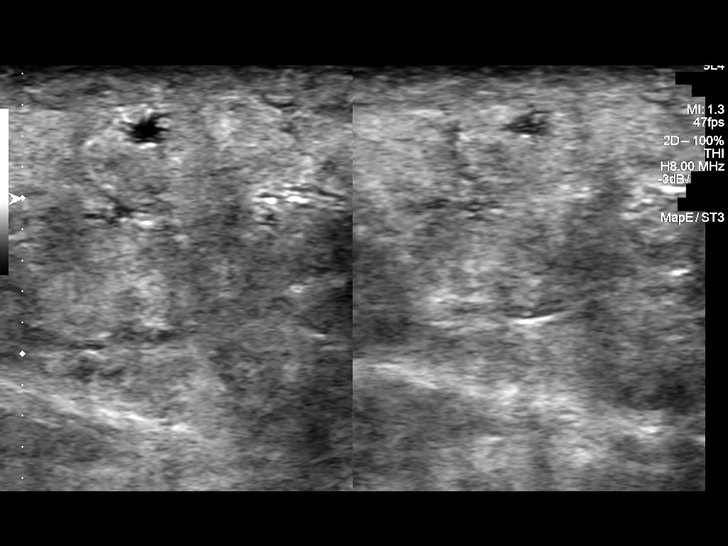
[im 60/66]
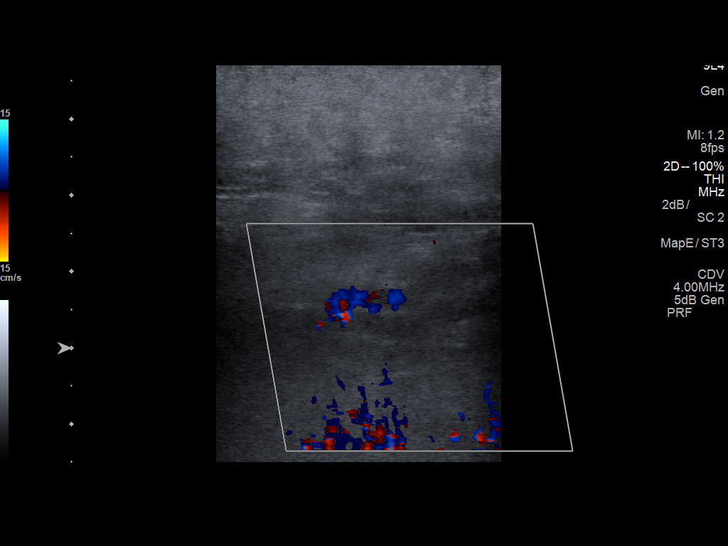
[im 66/66]
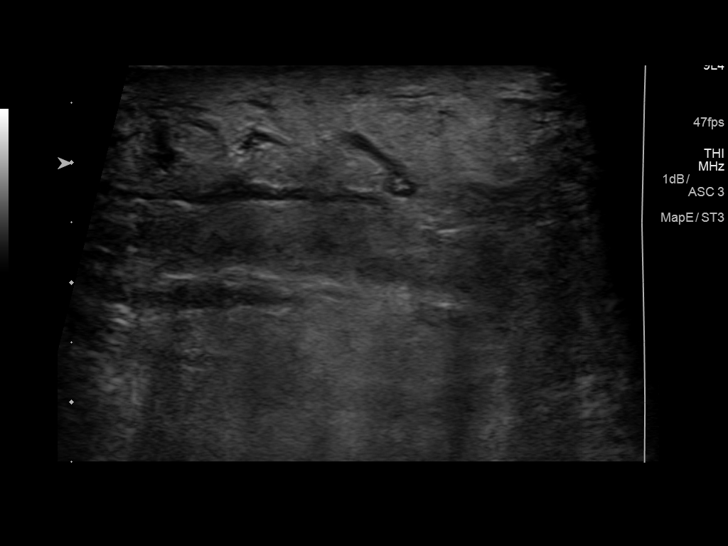

[13 of 24 positions shown; findings below may reference images not displayed]

FINDINGS: Contralateral Common Femoral Vein: Respiratory phasicity is normal
and symmetric with the symptomatic side. No evidence of thrombus.
Normal compressibility.

Common Femoral Vein: No evidence of thrombus. Normal
compressibility, respiratory phasicity and response to augmentation.

Saphenofemoral Junction: No evidence of thrombus. Normal
compressibility and flow on color Doppler imaging.

Profunda Femoral Vein: No evidence of thrombus. Normal
compressibility and flow on color Doppler imaging.

Femoral Vein: No evidence of thrombus. Normal compressibility,
respiratory phasicity and response to augmentation.

Popliteal Vein: No evidence of thrombus. Normal compressibility,
respiratory phasicity and response to augmentation.

Calf Veins: No evidence of thrombus. Normal compressibility and flow
on color Doppler imaging.

Superficial Great Saphenous Vein: No evidence of thrombus. Normal
compressibility and flow on color Doppler imaging.

Other Findings:  Edema
IMPRESSION: Sonographic survey of the left lower extremity negative for DVT.

Lower extremity edema

## 2019-03-22 IMAGING — US US ABDOMEN COMPLETE
1 series · 14 of 25 positions shown · non-contrast
Comparison: Abdominal ultrasound 08/21/2015

CLINICAL DATA: Patient with elevated LFTs.

EXAM:
ABDOMEN ULTRASOUND COMPLETE

[Series 1: us abdomen complete · 0.24mm/px · 14 of 63 slices shown]
[im 1/63]
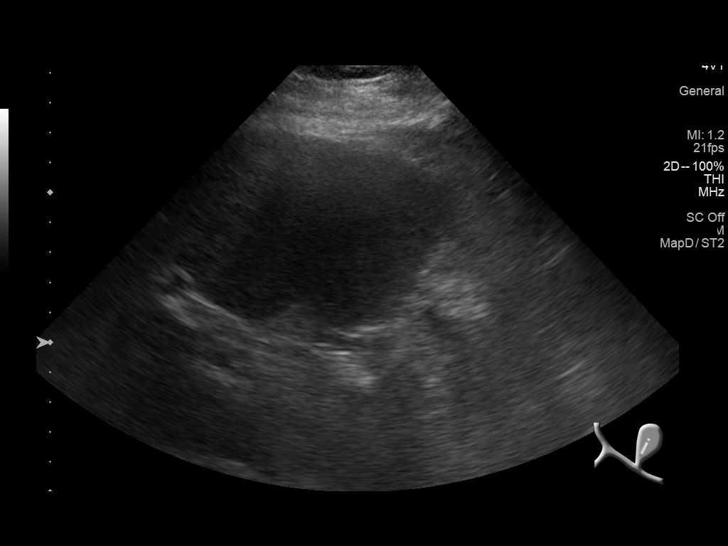
[im 6/63]
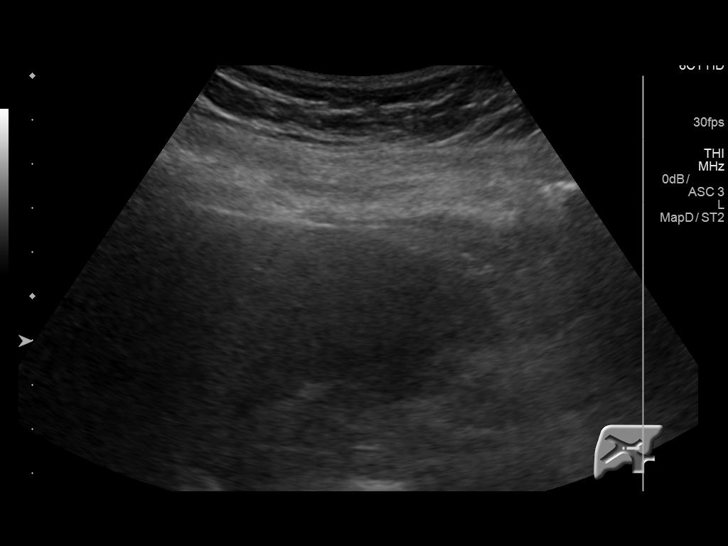
[im 11/63]
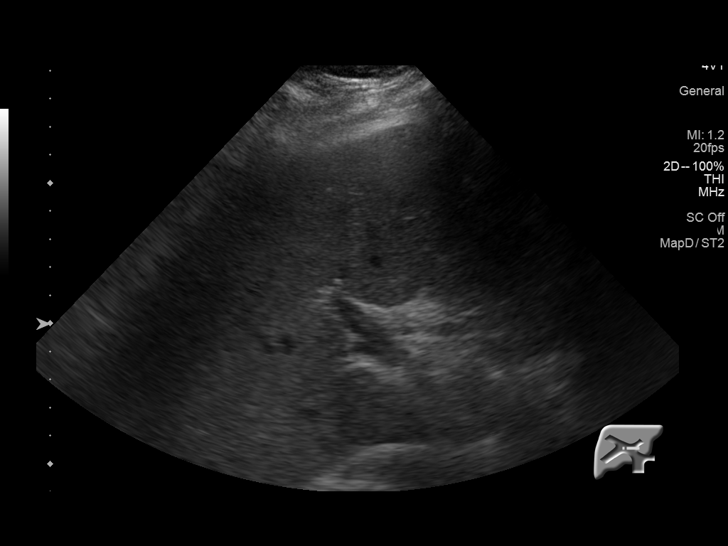
[im 16/63]
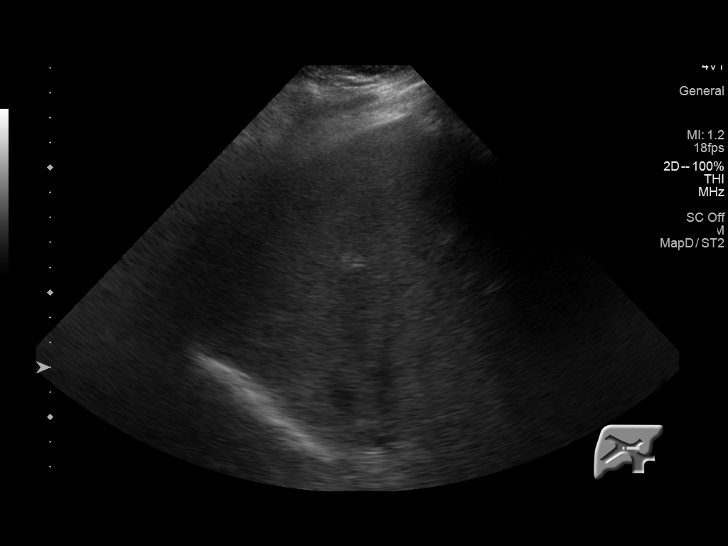
[im 21/63]
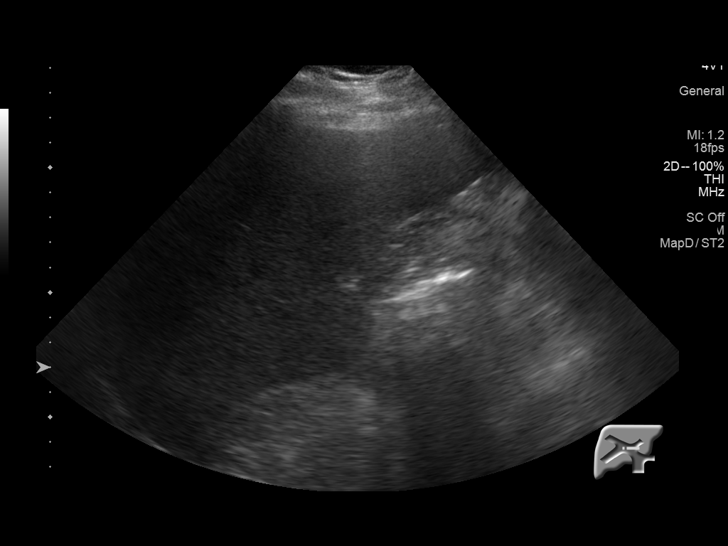
[im 24/63]
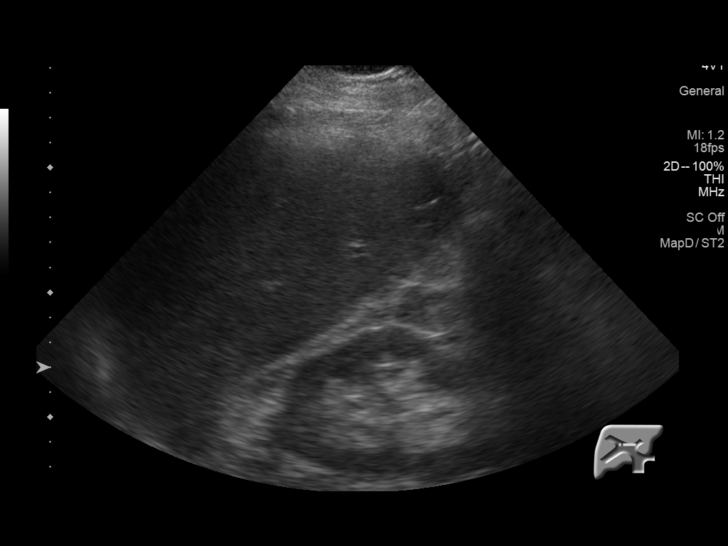
[im 29/63]
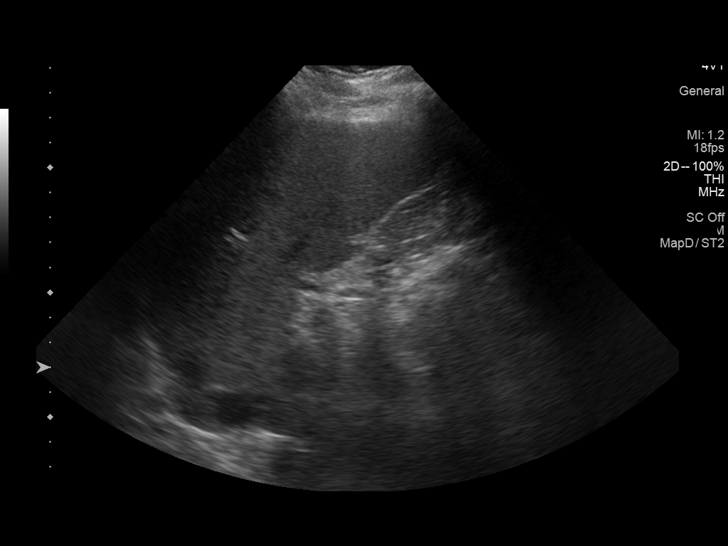
[im 34/63]
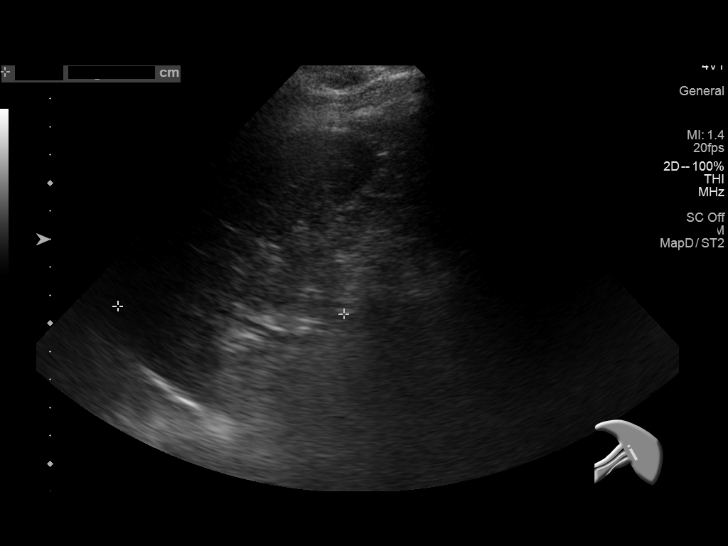
[im 39/63]
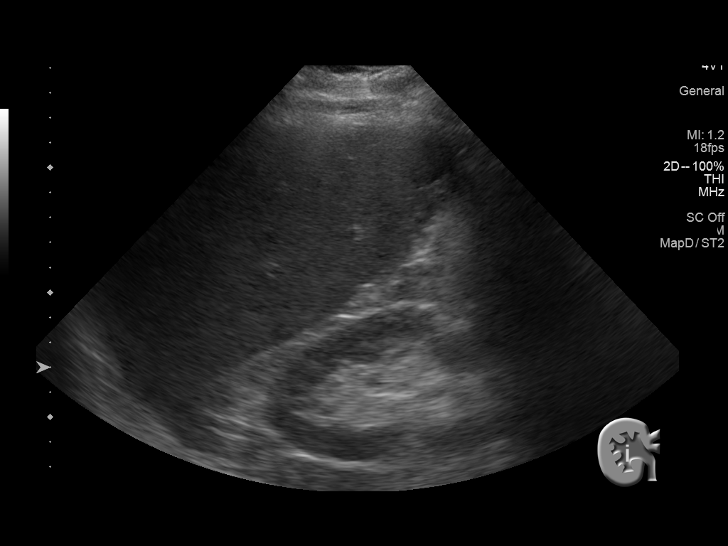
[im 42/63]
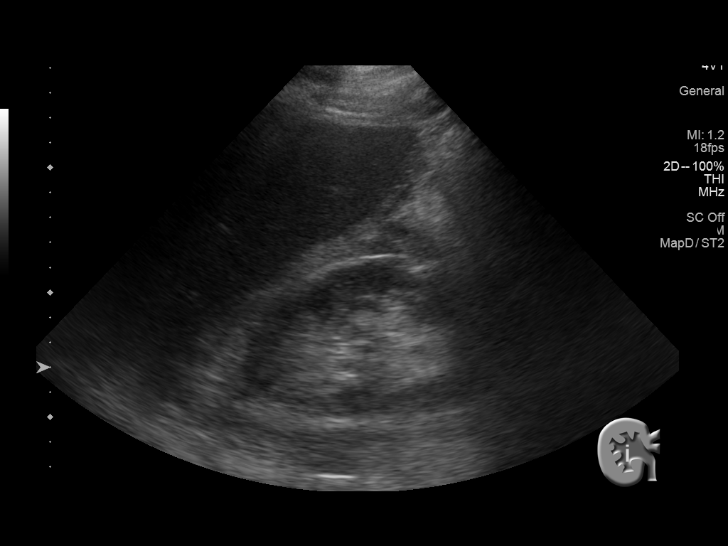
[im 47/63]
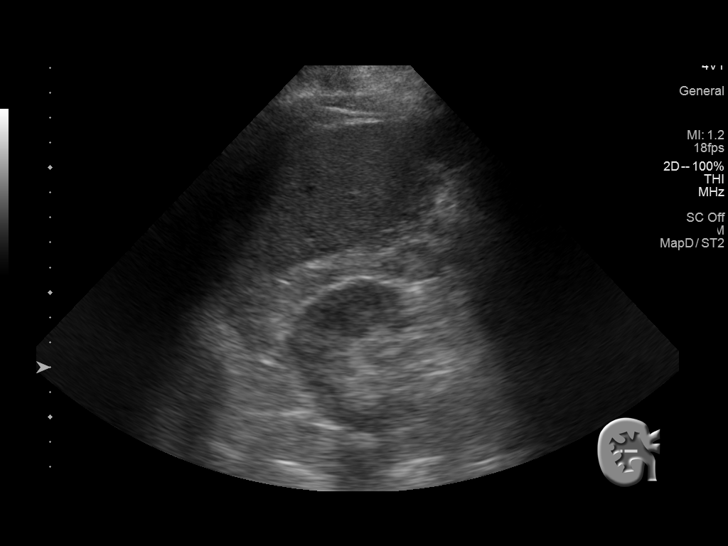
[im 52/63]
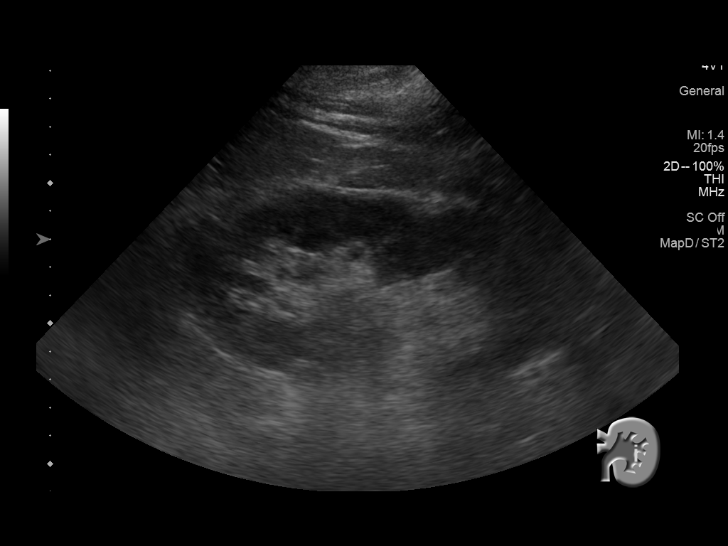
[im 57/63]
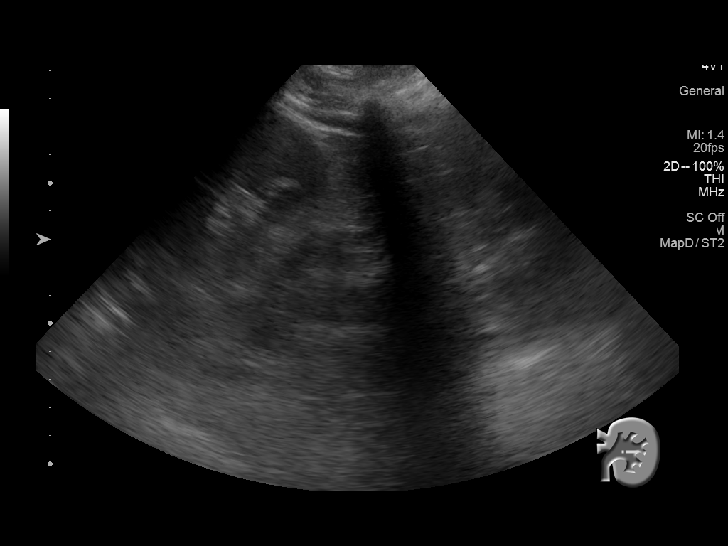
[im 63/63]
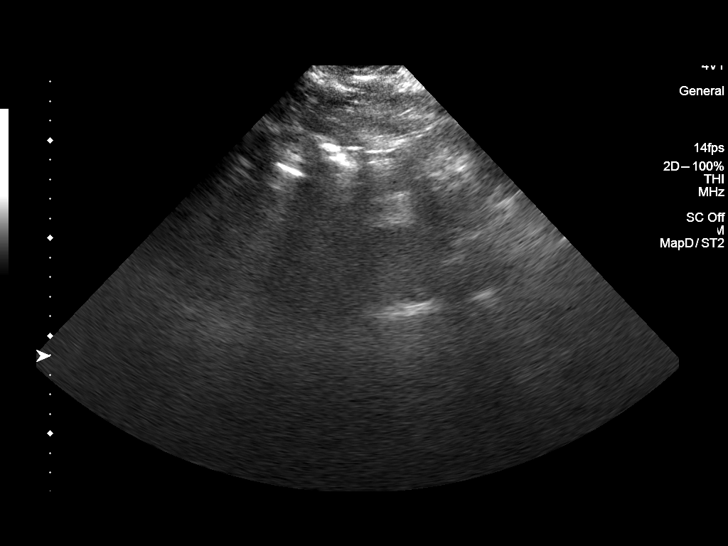

[14 of 25 positions shown; findings below may reference images not displayed]

FINDINGS: Gallbladder: Surgically absent

Common bile duct: Diameter: 6 mm

Liver: No focal lesion identified. Within normal limits in
parenchymal echogenicity. Portal vein is patent on color Doppler
imaging with normal direction of blood flow towards the liver.

IVC: No abnormality visualized.

Pancreas: Not visualized.

Spleen: Size and appearance within normal limits.

Right Kidney: Length: 11.2 cm. Echogenicity within normal limits. No
mass or hydronephrosis visualized.

Left Kidney: Length: 13.2 cm. Echogenicity within normal limits. No
mass or hydronephrosis visualized.

Abdominal aorta: Not visualized.

Other findings: None.
IMPRESSION: No acute process.

## 2019-03-25 DIAGNOSIS — S31829D Unspecified open wound of left buttock, subsequent encounter: Secondary | ICD-10-CM | POA: Diagnosis not present

## 2019-03-25 DIAGNOSIS — R2243 Localized swelling, mass and lump, lower limb, bilateral: Secondary | ICD-10-CM | POA: Diagnosis not present

## 2019-03-25 DIAGNOSIS — B356 Tinea cruris: Secondary | ICD-10-CM | POA: Diagnosis not present

## 2019-03-29 DIAGNOSIS — E1169 Type 2 diabetes mellitus with other specified complication: Secondary | ICD-10-CM | POA: Diagnosis not present

## 2019-03-29 DIAGNOSIS — D649 Anemia, unspecified: Secondary | ICD-10-CM | POA: Diagnosis not present

## 2019-03-29 DIAGNOSIS — I10 Essential (primary) hypertension: Secondary | ICD-10-CM | POA: Diagnosis not present

## 2019-03-29 DIAGNOSIS — D509 Iron deficiency anemia, unspecified: Secondary | ICD-10-CM | POA: Diagnosis not present

## 2019-03-29 DIAGNOSIS — E782 Mixed hyperlipidemia: Secondary | ICD-10-CM | POA: Diagnosis not present

## 2019-04-01 DIAGNOSIS — E1169 Type 2 diabetes mellitus with other specified complication: Secondary | ICD-10-CM | POA: Diagnosis not present

## 2019-04-01 DIAGNOSIS — D649 Anemia, unspecified: Secondary | ICD-10-CM | POA: Diagnosis not present

## 2019-04-01 DIAGNOSIS — R944 Abnormal results of kidney function studies: Secondary | ICD-10-CM | POA: Diagnosis not present

## 2019-04-01 DIAGNOSIS — M810 Age-related osteoporosis without current pathological fracture: Secondary | ICD-10-CM | POA: Diagnosis not present

## 2019-04-01 DIAGNOSIS — G4730 Sleep apnea, unspecified: Secondary | ICD-10-CM | POA: Diagnosis not present

## 2019-04-01 DIAGNOSIS — I10 Essential (primary) hypertension: Secondary | ICD-10-CM | POA: Diagnosis not present

## 2019-04-01 DIAGNOSIS — R600 Localized edema: Secondary | ICD-10-CM | POA: Diagnosis not present

## 2019-04-01 DIAGNOSIS — I482 Chronic atrial fibrillation, unspecified: Secondary | ICD-10-CM | POA: Diagnosis not present

## 2019-04-01 DIAGNOSIS — E782 Mixed hyperlipidemia: Secondary | ICD-10-CM | POA: Diagnosis not present

## 2019-04-01 DIAGNOSIS — R339 Retention of urine, unspecified: Secondary | ICD-10-CM | POA: Diagnosis not present

## 2019-04-13 DIAGNOSIS — N529 Male erectile dysfunction, unspecified: Secondary | ICD-10-CM | POA: Diagnosis not present

## 2019-04-13 DIAGNOSIS — M1990 Unspecified osteoarthritis, unspecified site: Secondary | ICD-10-CM | POA: Diagnosis not present

## 2019-04-13 DIAGNOSIS — I4891 Unspecified atrial fibrillation: Secondary | ICD-10-CM | POA: Diagnosis not present

## 2019-04-13 DIAGNOSIS — R32 Unspecified urinary incontinence: Secondary | ICD-10-CM | POA: Diagnosis not present

## 2019-04-13 DIAGNOSIS — I10 Essential (primary) hypertension: Secondary | ICD-10-CM | POA: Diagnosis not present

## 2019-04-13 DIAGNOSIS — M810 Age-related osteoporosis without current pathological fracture: Secondary | ICD-10-CM | POA: Diagnosis not present

## 2019-04-13 DIAGNOSIS — K219 Gastro-esophageal reflux disease without esophagitis: Secondary | ICD-10-CM | POA: Diagnosis not present

## 2019-04-13 DIAGNOSIS — E785 Hyperlipidemia, unspecified: Secondary | ICD-10-CM | POA: Diagnosis not present

## 2019-04-13 DIAGNOSIS — D6869 Other thrombophilia: Secondary | ICD-10-CM | POA: Diagnosis not present

## 2019-04-14 DIAGNOSIS — L89322 Pressure ulcer of left buttock, stage 2: Secondary | ICD-10-CM | POA: Diagnosis not present

## 2019-04-14 DIAGNOSIS — I352 Nonrheumatic aortic (valve) stenosis with insufficiency: Secondary | ICD-10-CM | POA: Diagnosis not present

## 2019-06-07 DIAGNOSIS — J069 Acute upper respiratory infection, unspecified: Secondary | ICD-10-CM | POA: Diagnosis not present

## 2019-06-09 DIAGNOSIS — R05 Cough: Secondary | ICD-10-CM | POA: Diagnosis not present

## 2019-06-09 DIAGNOSIS — Z7901 Long term (current) use of anticoagulants: Secondary | ICD-10-CM | POA: Diagnosis not present

## 2019-06-09 DIAGNOSIS — I10 Essential (primary) hypertension: Secondary | ICD-10-CM | POA: Diagnosis not present

## 2019-06-09 DIAGNOSIS — Z87891 Personal history of nicotine dependence: Secondary | ICD-10-CM | POA: Diagnosis not present

## 2019-06-09 DIAGNOSIS — E669 Obesity, unspecified: Secondary | ICD-10-CM | POA: Diagnosis not present

## 2019-06-09 DIAGNOSIS — R600 Localized edema: Secondary | ICD-10-CM | POA: Diagnosis not present

## 2019-06-09 DIAGNOSIS — E785 Hyperlipidemia, unspecified: Secondary | ICD-10-CM | POA: Diagnosis not present

## 2019-06-09 DIAGNOSIS — Z594 Lack of adequate food and safe drinking water: Secondary | ICD-10-CM | POA: Diagnosis not present

## 2019-06-09 DIAGNOSIS — N3281 Overactive bladder: Secondary | ICD-10-CM | POA: Diagnosis not present

## 2019-07-13 DIAGNOSIS — I10 Essential (primary) hypertension: Secondary | ICD-10-CM | POA: Diagnosis not present

## 2019-07-26 DIAGNOSIS — S31829D Unspecified open wound of left buttock, subsequent encounter: Secondary | ICD-10-CM | POA: Diagnosis not present

## 2019-08-04 ENCOUNTER — Telehealth: Payer: Self-pay

## 2019-08-04 NOTE — Telephone Encounter (Signed)
Virtual Visit Pre-Appointment Phone Call  "(Name), I am calling you today to discuss your upcoming appointment. We are currently trying to limit exposure to the virus that causes COVID-19 by seeing patients at home rather than in the office."  1. "What is the BEST phone number to call the day of the visit?" - include this in appointment notes  2. "Do you have or have access to (through a family member/friend) a smartphone with video capability that we can use for your visit?" a. If yes - list this number in appt notes as "cell" (if different from BEST phone #) and list the appointment type as a VIDEO visit in appointment notes b. If no - list the appointment type as a PHONE visit in appointment notes  Confirm consent - "In the setting of the current Covid19 crisis, you are scheduled for a (phone or video) visit with your provider on (date) at (time).  Just as we do with many in-office visits, in order for you to participate in this visit, we must obtain consent.  If you'd like, I can send this to your mychart (if signed up) or email for you to review.  Otherwise, I can obtain your verbal consent now.  All virtual visits are billed to your insurance company just like a normal visit would be.  By agreeing to a virtual visit, we'd like you to understand that the technology does not allow for your provider to perform an examination, and thus may limit your provider's ability to fully assess your condition. If your provider identifies any concerns that need to be evaluated in person, we will make arrangements to do so.  Finally, though the technology is pretty good, we cannot assure that it will always work on either your or our end, and in the setting of a video visit, we may have to convert it to a phone-only visit.  In either situation, we cannot ensure that we have a secure connection.  Are you willing to proceed?" STAFF: Did the patient verbally acknowledge consent to telehealth visit? Document  YES/NO here:  3. Advise patient to be prepared - "Two hours prior to your appointment, go ahead and check your blood pressure, pulse, oxygen saturation, and your weight (if you have the equipment to check those) and write them all down. When your visit starts, your provider will ask you for this information. If you have an Apple Watch or Kardia device, please plan to have heart rate information ready on the day of your appointment. Please have a pen and paper handy nearby the day of the visit as well."  4. Give patient instructions for MyChart download to smartphone OR Doximity/Doxy.me as below if video visit (depending on what platform provider is using)  5. Inform patient they will receive a phone call 15 minutes prior to their appointment time (may be from unknown caller ID) so they should be prepared to answer    TELEPHONE CALL NOTE  Gary Carroll has been deemed a candidate for a follow-up tele-health visit to limit community exposure during the Covid-19 pandemic. I spoke with the patient via phone to ensure availability of phone/video source, confirm preferred email & phone number, and discuss instructions and expectations.  I reminded Gary Carroll to be prepared with any vital sign and/or heart rhythm information that could potentially be obtained via home monitoring, at the time of his visit. I reminded Gary Carroll to expect a phone call prior to his visit.  Gary Carroll 08/04/2019 3:13 PM   INSTRUCTIONS FOR DOWNLOADING THE MYCHART APP TO SMARTPHONE  - The patient must first make sure to have activated MyChart and know their login information - If Apple, go to [employer] and type in MyChart in the search bar and download the app. If Android, ask patient to go to [employer] and type in [place] in the search bar and download the app. The app is free but as with any other app downloads, their phone may require them to verify saved payment information or  Apple/Android password.  - The patient will need to then log into the app with their MyChart username and password, and select [facility] as their healthcare provider to link the account. When it is time for your visit, go to the MyChart app, find appointments, and click Begin Video Visit. Be sure to Select Allow for your device to access the Microphone and Camera for your visit. You will then be connected, and your provider will be with you shortly.  **If they have any issues connecting, or need assistance please contact MyChart service desk [phone])**  **If using a computer, in order to ensure the best quality for their visit they will need to use either of the following Internet Browsers: [employer], or Google Chrome**  IF USING DOXIMITY or DOXY.ME - The patient will receive a link just prior to their visit by text.     FULL LENGTH CONSENT FOR TELE-HEALTH VISIT   I hereby voluntarily request, consent and authorize [facility] and its employed or contracted physicians, physician assistants, nurse practitioners or other licensed health care professionals (the Practitioner), to provide me with telemedicine health care services (the "Services") as deemed necessary by the treating Practitioner. I acknowledge and consent to receive the Services by the Practitioner via telemedicine. I understand that the telemedicine visit will involve communicating with the Practitioner through live audiovisual communication technology and the disclosure of certain medical information by electronic transmission. I acknowledge that I have been given the opportunity to request an in-person assessment or other available alternative prior to the telemedicine visit and am voluntarily participating in the telemedicine visit.  I understand that I have the right to withhold or withdraw my consent to the use of telemedicine in the course of my care at any time, without affecting my right to future care  or treatment, and that the Practitioner or I may terminate the telemedicine visit at any time. I understand that I have the right to inspect all information obtained and/or recorded in the course of the telemedicine visit and may receive copies of available information for a reasonable fee.  I understand that some of the potential risks of receiving the Services via telemedicine include:  Marland Kitchen Delay or interruption in medical evaluation due to technological equipment failure or disruption; . Information transmitted may not be sufficient (e.g. poor resolution of images) to allow for appropriate medical decision making by the Practitioner; and/or  . In rare instances, security protocols could fail, causing a breach of personal health information.  Furthermore, I acknowledge that it is my responsibility to provide information about my medical history, conditions and care that is complete and accurate to the best of my ability. I acknowledge that Practitioner's advice, recommendations, and/or decision may be based on factors not within their control, such as incomplete or inaccurate data provided by me or distortions of diagnostic images or specimens that may result from electronic transmissions. I understand that the practice  of medicine is not an Chief Strategy Officer and that Practitioner makes no warranties or guarantees regarding treatment outcomes. I acknowledge that I will receive a copy of this consent concurrently upon execution via email to the email address I last provided but may also request a printed copy by calling the office of [facility].    I understand that my insurance will be billed for this visit.   I have read or had this consent read to me. . I understand the contents of this consent, which adequately explains the benefits and risks of the Services being provided via telemedicine.  . I have been provided ample opportunity to ask questions regarding this consent and the Services and have had  my questions answered to my satisfaction. . I give my informed consent for the services to be provided through the use of telemedicine in my medical care  By participating in this telemedicine visit I agree to the above.

## 2019-08-10 ENCOUNTER — Encounter: Payer: Self-pay | Admitting: Cardiology

## 2019-08-10 ENCOUNTER — Telehealth (INDEPENDENT_AMBULATORY_CARE_PROVIDER_SITE_OTHER): Payer: Medicare HMO | Admitting: Cardiology

## 2019-08-10 VITALS — BP 148/65 | HR 66 | Ht 69.0 in | Wt 272.0 lb

## 2019-08-10 DIAGNOSIS — E782 Mixed hyperlipidemia: Secondary | ICD-10-CM

## 2019-08-10 DIAGNOSIS — R531 Weakness: Secondary | ICD-10-CM | POA: Diagnosis not present

## 2019-08-10 DIAGNOSIS — I5032 Chronic diastolic (congestive) heart failure: Secondary | ICD-10-CM

## 2019-08-10 DIAGNOSIS — I350 Nonrheumatic aortic (valve) stenosis: Secondary | ICD-10-CM

## 2019-08-10 DIAGNOSIS — I480 Paroxysmal atrial fibrillation: Secondary | ICD-10-CM

## 2019-08-10 DIAGNOSIS — Z96652 Presence of left artificial knee joint: Secondary | ICD-10-CM | POA: Diagnosis not present

## 2019-08-10 DIAGNOSIS — R600 Localized edema: Secondary | ICD-10-CM | POA: Diagnosis not present

## 2019-08-10 DIAGNOSIS — Z96651 Presence of right artificial knee joint: Secondary | ICD-10-CM | POA: Diagnosis not present

## 2019-08-10 NOTE — Progress Notes (Signed)
Virtual Visit via Telephone Note   This visit type was conducted due to national recommendations for restrictions regarding the COVID-19 Pandemic (e.g. social distancing) in an effort to limit this patient's exposure and mitigate transmission in our community.  Due to his co-morbid illnesses, this patient is at least at moderate risk for complications without adequate follow up.  This format is felt to be most appropriate for this patient at this time.  The patient did not have access to video technology/had technical difficulties with video requiring transitioning to audio format only (telephone).  All issues noted in this document were discussed and addressed.  No physical exam could be performed with this format.  Please refer to the patient's chart for his  consent to telehealth for [facility].   The patient was identified using 2 identifiers.  Date:  08/10/2019   ID:  SOUL COMPANION, DOB Jun 03, 1945, MRN [id number]  Patient Location: Home Provider Location: Office  PCP:  Celene Squibb, MD  Cardiologist:  Kate Sable, MD  Electrophysiologist:  None   Evaluation Performed:  Follow-Up Visit  Chief Complaint:  No angina   History of Present Illness:    Gary Carroll is a 74 y.o. male with a hx of a fib, HLD, chronic diastolic HF,HLD  and aortic stenosis, last echo 09/2017 with EF 55-60%G1DD mild to moderate AS moderate AR , on lasix and eliquis  Last visit HR regular and last EKG in 2019 EKG SR   Repeat echo 04/06/19 EF was 55%, mild concentric LVH, moderate AS, aortic mean pressure 28 mmHg, calculated  valve area 1.1 cm2 using the continuity  Equation and aortic valve pressure half time is 473   Today he feels well, no chest pain or SOB, no lightheadedness or syncope.  He is not aware when he has a fib.  He has no racing HR complaints.  No bleeding on eliquis  He exercises on bike and swimming.  He continues with lower ext edema but no changes.  He watches his salt  intake.    Pt has had both COVID vaccines and now plans to have shingles vaccine.    The patient does not have symptoms concerning for COVID-19 infection (fever, chills, cough, or new shortness of breath).    Past Medical History:  Diagnosis Date  . Allergic rhinitis   . Arthritis   . Asthma    seasonal per pt  . Bilateral knee pain    OA  . Diverticulosis 02/2012   seen on colonoscopy  . Erectile dysfunction   . GERD (gastroesophageal reflux disease)   . Heart murmur   . Hiatal hernia   . HLD (hyperlipidemia)   . HTN (hypertension)   . Hydronephrosis determined by ultrasound 08/22/2015  . Overactive bladder   . Prediabetes   . Ringing of ears   . Sleep apnea   . Urinary frequency   . Urinary urgency   . Wears glasses    Past Surgical History:  Procedure Laterality Date  . CHOLECYSTECTOMY    . COLONOSCOPY  02/2012   Dr. Deatra Ina: sigmoid, transverse, and ascending colon diverticulosis  . FINGER SURGERY Right   . prostate procedure  2017   per patient, was having trouble urinating   . TONSILLECTOMY    . 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 Lorre Nick)  . 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 apixaban (ELIQUIS) 5 MG TABS tablet Take 1 tablet (5 mg total) by mouth 2 (two) times daily.  . Calcium Carb-Cholecalciferol (CALCIUM 500 +D PO) Take by mouth.  . diphenhydrAMINE (BENADRYL) 25 MG tablet Take 50 mg by mouth at bedtime as needed for sleep.  . furosemide (LASIX) 40 MG tablet Take 40 mg by mouth daily.  Marland Kitchen loratadine (CLARITIN) 10 MG tablet Take 10 mg by mouth daily.  Marland Kitchen MAGNESIUM PO Take by mouth.  . Melatonin 5 MG TABS Take 10 mg by mouth at bedtime.   . metoprolol tartrate (LOPRESSOR) 25 MG tablet Take 1 tablet (25 mg total) by mouth 2 (two) times daily. (Patient taking differently: Take 25 mg by mouth 2  (two) times daily. Two Tablets in the morning and one in the Evening.)  . Multiple Vitamins-Minerals (MULTIVITAL) tablet Take 1 tablet by mouth daily.    . Omega-3 Fatty Acids (FISH OIL) 1000 MG CAPS Take 4 capsules by mouth daily.   Marland Kitchen oxybutynin (DITROPAN) 5 MG tablet Take 5 mg by mouth daily.  . potassium chloride (K-DUR) 10 MEQ tablet Take 1 tablet (10 mEq total) by mouth daily.  . pravastatin (PRAVACHOL) 40 MG tablet   . vitamin C (ASCORBIC ACID) 500 MG tablet Take 500 mg by mouth daily.  . [DISCONTINUED] furosemide (LASIX) 20 MG tablet Take 1 tablet (20 mg total) by mouth daily. (Patient taking differently: Take 40 mg by mouth daily. )  . [DISCONTINUED] oxybutynin (DITROPAN-XL) 10 MG 24 hr tablet Take 5 mg by mouth daily.      Allergies:   Diovan [valsartan] and Tagamet [cimetidine]   Social History   Tobacco Use  . Smoking status: Former Smoker    Types: Cigarettes    Quit date: 01/18/2010    Years since quitting: 9.5  . Smokeless tobacco: Never Used  . Tobacco comment: Smoked for 25 years- quit September 2011.  Passive exposure from wife  Substance Use Topics  . Alcohol use: Not Currently    Alcohol/week: 1.0 - 2.0 standard drinks    Types: 1 - 2 Glasses of wine per week    Comment: Stopped drinking 1-2 glasses of wine daily in 2017   . Drug use: No     Family Hx: The patient's family history includes Cancer in his mother; Coronary artery disease in his father and paternal grandfather; Heart disease in his paternal grandfather; Uterine cancer in his mother. There is no history of Colon cancer, Esophageal cancer, Stomach cancer, Rectal cancer, or Liver disease.  ROS:   Please see the history of present illness.    General:no colds or fevers, no weight changes Skin:no rashes or ulcers HEENT:no blurred vision, no congestion CV:see HPI PUL:see HPI GI:no diarrhea constipation or melena, no indigestion GU:no hematuria, no dysuria MS:no joint pain, no claudication Neuro:no  syncope, no lightheadedness Endo:no diabetes, no thyroid disease  All other systems reviewed and are negative.   Prior CV studies:   The following studies were reviewed today:  Echo see above   Labs/Other Tests and Data Reviewed:    EKG:  An ECG dated 10/01/17 was personally reviewed today and demonstrated:  ST at 106 LAD, no ST changes.    Recent Labs: No results found for requested labs within last 8760 hours.   Recent Lipid Panel Lab Results  Component Value Date/Time   CHOL 139 02/06/2012 09:29 AM   TRIG 92 02/06/2012 09:29 AM  HDL 35 (L) 02/06/2012 09:29 AM   CHOLHDL 4.0 02/06/2012 09:29 AM   LDLCALC 86 02/06/2012 09:29 AM    Wt Readings from Last 3 Encounters:  08/10/19 272 lb (123.4 kg)  02/24/19 280 lb (127 kg)  12/14/18 273 lb (123.8 kg)     Objective:    Vital Signs:  BP (!) 148/65   Pulse 66   Ht 5\' 9"  (1.753 m)   Wt 272 lb (123.4 kg)   SpO2 94%   BMI 40.17 kg/m    VITAL SIGNS:  reviewed  General, NAD Neuro A&O X 3 answers questions approp  Pulmonary can speak in complete sentences without SOB   ASSESSMENT & PLAN:    1. Aortic stenosis moderate [facility],  No symptoms to think it was worse.  Will have him follow up in 6 months with Dr. Bronson Ing and have EKG done then.   2. PAF on Eliquis no bleeding and no awareness of a fib.  He is thinking of buying monitor to check his HR with his phone - will check with EP on their recommendations, aliveCor may be the name 3. Anticoagulation on Eliquis and no bleeding.  4. HLD on statin followed by PCP 5. Chronic diastolic HF , continues per pt with lower ext edema, and on lasix, we discussed decreased salt   COVID-19 Education: The signs and symptoms of COVID-19 were discussed with the patient and how to seek care for testing (follow up with PCP or arrange E-visit).  The importance of social distancing was discussed today.  Time:   Today, I have spent 10 minutes with the patient with telehealth  technology discussing the above problems.     Medication Adjustments/Labs and Tests Ordered: Current medicines are reviewed at length with the patient today.  Concerns regarding medicines are outlined above.   Tests Ordered: No orders of the defined types were placed in this encounter.   Medication Changes: No orders of the defined types were placed in this encounter.   Follow Up:  In person visit with EKG in 6 months.  Signed, Cecilie Kicks, NP  08/10/2019 2:46 PM    [facility]

## 2019-08-10 NOTE — Patient Instructions (Signed)
Medication Instructions:  Your physician recommends that you continue on your current medications as directed. Please refer to the Current Medication list given to you today.  *If you need a refill on your cardiac medications before your next appointment, please call your pharmacy*   Lab Work: NONE   If you have labs (blood work) drawn today and your tests are completely normal, you will receive your results only by: . MyChart Message (if you have MyChart) OR . A paper copy in the mail If you have any lab test that is abnormal or we need to change your treatment, we will call you to review the results.   Testing/Procedures: NONE   Follow-Up: At [facility], you and your health needs are our priority.  As part of our continuing mission to provide you with exceptional heart care, we have created designated Provider Care Teams.  These Care Teams include your primary Cardiologist (physician) and Advanced Practice Providers (APPs -  Physician Assistants and Nurse Practitioners) who all work together to provide you with the care you need, when you need it.  We recommend signing up for the patient portal called "MyChart".  Sign up information is provided on this After Visit Summary.  MyChart is used to connect with patients for Virtual Visits (Telemedicine).  Patients are able to view lab/test results, encounter notes, upcoming appointments, etc.  Non-urgent messages can be sent to your provider as well.   To learn more about what you can do with MyChart, go to [email or web address].    Your next appointment:   6 month(s)  The format for your next appointment:   In Person  Provider:   Suresh Koneswaran, MD   Other Instructions Thank you for choosing [facility]!    

## 2019-08-24 DIAGNOSIS — L89322 Pressure ulcer of left buttock, stage 2: Secondary | ICD-10-CM | POA: Diagnosis not present

## 2019-10-01 DIAGNOSIS — D509 Iron deficiency anemia, unspecified: Secondary | ICD-10-CM | POA: Diagnosis not present

## 2019-10-01 DIAGNOSIS — H02849 Edema of unspecified eye, unspecified eyelid: Secondary | ICD-10-CM | POA: Diagnosis not present

## 2019-10-01 DIAGNOSIS — H1033 Unspecified acute conjunctivitis, bilateral: Secondary | ICD-10-CM | POA: Diagnosis not present

## 2019-10-01 DIAGNOSIS — E1169 Type 2 diabetes mellitus with other specified complication: Secondary | ICD-10-CM | POA: Diagnosis not present

## 2019-10-01 DIAGNOSIS — E119 Type 2 diabetes mellitus without complications: Secondary | ICD-10-CM | POA: Diagnosis not present

## 2019-10-01 DIAGNOSIS — G9009 Other idiopathic peripheral autonomic neuropathy: Secondary | ICD-10-CM | POA: Diagnosis not present

## 2019-10-01 DIAGNOSIS — D649 Anemia, unspecified: Secondary | ICD-10-CM | POA: Diagnosis not present

## 2019-10-01 DIAGNOSIS — G4730 Sleep apnea, unspecified: Secondary | ICD-10-CM | POA: Diagnosis not present

## 2019-10-01 DIAGNOSIS — E782 Mixed hyperlipidemia: Secondary | ICD-10-CM | POA: Diagnosis not present

## 2019-10-05 DIAGNOSIS — D649 Anemia, unspecified: Secondary | ICD-10-CM | POA: Diagnosis not present

## 2019-10-05 DIAGNOSIS — N1831 Chronic kidney disease, stage 3a: Secondary | ICD-10-CM | POA: Diagnosis not present

## 2019-10-05 DIAGNOSIS — R339 Retention of urine, unspecified: Secondary | ICD-10-CM | POA: Diagnosis not present

## 2019-10-05 DIAGNOSIS — E782 Mixed hyperlipidemia: Secondary | ICD-10-CM | POA: Diagnosis not present

## 2019-10-05 DIAGNOSIS — E1169 Type 2 diabetes mellitus with other specified complication: Secondary | ICD-10-CM | POA: Diagnosis not present

## 2019-10-05 DIAGNOSIS — M810 Age-related osteoporosis without current pathological fracture: Secondary | ICD-10-CM | POA: Diagnosis not present

## 2019-10-05 DIAGNOSIS — G4730 Sleep apnea, unspecified: Secondary | ICD-10-CM | POA: Diagnosis not present

## 2019-10-05 DIAGNOSIS — R600 Localized edema: Secondary | ICD-10-CM | POA: Diagnosis not present

## 2019-10-05 DIAGNOSIS — Z0001 Encounter for general adult medical examination with abnormal findings: Secondary | ICD-10-CM | POA: Diagnosis not present

## 2019-10-05 DIAGNOSIS — Z6835 Body mass index (BMI) 35.0-35.9, adult: Secondary | ICD-10-CM | POA: Diagnosis not present

## 2019-10-05 DIAGNOSIS — I10 Essential (primary) hypertension: Secondary | ICD-10-CM | POA: Diagnosis not present

## 2019-10-19 ENCOUNTER — Encounter (HOSPITAL_BASED_OUTPATIENT_CLINIC_OR_DEPARTMENT_OTHER): Payer: Medicare HMO | Attending: Internal Medicine | Admitting: Internal Medicine

## 2019-10-19 DIAGNOSIS — Z87891 Personal history of nicotine dependence: Secondary | ICD-10-CM | POA: Diagnosis not present

## 2019-10-19 DIAGNOSIS — Z96643 Presence of artificial hip joint, bilateral: Secondary | ICD-10-CM | POA: Diagnosis not present

## 2019-10-19 DIAGNOSIS — E119 Type 2 diabetes mellitus without complications: Secondary | ICD-10-CM | POA: Diagnosis not present

## 2019-10-19 DIAGNOSIS — I10 Essential (primary) hypertension: Secondary | ICD-10-CM | POA: Insufficient documentation

## 2019-10-19 DIAGNOSIS — L89329 Pressure ulcer of left buttock, unspecified stage: Secondary | ICD-10-CM | POA: Diagnosis not present

## 2019-10-19 NOTE — Progress Notes (Signed)
TRAYON, KANIA ([id number]) Visit Report for 10/19/2019 Abuse/Suicide Risk Screen Details Patient Name: Date of Service: Gary Carroll. 10/19/2019 10:30 A M Medical Record Number: [id number] Patient Account Number: [id number] Date of Birth/Sex: Treating RN: 12/15/1945 (74 y.o. Gary Carroll Primary Care Deran Barro: Joline Salt HN Other Clinician: Referring Marivel Mcclarty: Treating Anderson Coppock/Extender: Otho Perl HN Weeks in Treatment: 0 Abuse/Suicide Risk Screen Items Answer ABUSE RISK SCREEN: Has anyone close to you tried to hurt or harm you recentlyo No Do you feel uncomfortable with anyone in your familyo No Has anyone forced you do things that you didnt want to doo No Electronic Signature(s) Signed: 10/19/2019 5:54:40 PM By: Baruch Gouty RN, BSN Entered By: Baruch Gouty on 10/19/2019 11:30:35 -------------------------------------------------------------------------------- Activities of Daily Living Details Patient Name: Date of Service: Gary Arias RLES M. 10/19/2019 10:30 A M Medical Record Number: [id number] Patient Account Number: [id number] Date of Birth/Sex: Treating RN: [birth year] (74 y.o. Gary Carroll Primary Care Coulter Oldaker: Joline Salt HN Other Clinician: Referring Annalena Piatt: Treating Jahlani Lorentz/Extender: Otho Perl HN Weeks in Treatment: 0 Activities of Daily Living Items Answer Activities of Daily Living (Please select one for each item) Drive Automobile Completely Able T Medications ake Completely Able Use T elephone Completely Able Care for Appearance Completely Able Use T oilet Completely Able Bath / Shower Completely Able Dress Self Completely Able Feed Self Completely Able Walk Need Assistance Get In / Out Bed Completely Able Housework Completely Able Prepare Meals Completely Able Handle Money Completely Able Shop for Self Completely Able Electronic Signature(s) Signed: 10/19/2019 5:54:40 PM By: Baruch Gouty  RN, BSN Entered By: Baruch Gouty on 10/19/2019 11:31:03 -------------------------------------------------------------------------------- Education Screening Details Patient Name: Date of Service: Gary Carroll, [place]. 10/19/2019 10:30 A M Medical Record Number: [id number] Patient Account Number: [id number] Date of Birth/Sex: Treating RN: [birth year] (74 y.o. Gary Carroll Primary Care Oley Lahaie: Joline Salt HN Other Clinician: Referring Kimra Kantor: Treating Susana Gripp/Extender: Otho Perl HN Weeks in Treatment: 0 Primary Learner Assessed: Patient Learning Preferences/Education Level/Primary Language Learning Preference: Explanation, Demonstration, Printed Material Highest Education Level: College or Above Preferred Language: English Cognitive Barrier Language Barrier: No Translator Needed: No Memory Deficit: No Emotional Barrier: No Cultural/Religious Beliefs Affecting Medical Care: No Physical Barrier Impaired Vision: Yes Glasses Impaired Hearing: No Decreased Hand dexterity: No Knowledge/Comprehension Knowledge Level: High Comprehension Level: High Ability to understand written instructions: High Ability to understand verbal instructions: High Motivation Anxiety Level: Calm Cooperation: Cooperative Education Importance: Acknowledges Need Interest in Health Problems: Asks Questions Perception: Coherent Willingness to Engage in Self-Management High Activities: Readiness to Engage in Self-Management High Activities: Electronic Signature(s) Signed: 10/19/2019 5:54:40 PM By: Baruch Gouty RN, BSN Entered By: Baruch Gouty on 10/19/2019 11:31:35 -------------------------------------------------------------------------------- Fall Risk Assessment Details Patient Name: Date of Service: Gary Carroll, Gary Gonzalez. 10/19/2019 10:30 A M Medical Record Number: [id number] Patient Account Number: [id number] Date of Birth/Sex: Treating RN: Mar 22, 1946 (74 y.o. Gary Carroll Primary Care Yariah Selvey: Joline Salt HN Other Clinician: Referring Ysabel Cowgill: Treating Matia Zelada/Extender: Otho Perl HN Weeks in Treatment: 0 Fall Risk Assessment Items Have you had 2 or more falls in the last 12 monthso 0 Yes Have you had any fall that resulted in injury in the last 12 monthso 0 No FALLS RISK SCREEN History of falling - immediate or within 3 months 0 No Secondary diagnosis (Do you have 2 or more medical diagnoseso) 0 No Ambulatory aid None/bed  rest/wheelchair/nurse 0 No Crutches/cane/walker 15 Yes Furniture 0 No Intravenous therapy Access/Saline/Heparin Lock 0 No Gait/Transferring Normal/ bed rest/ wheelchair 0 Yes Weak (short steps with or without shuffle, stooped but able to lift head while walking, may seek 0 No support from furniture) Impaired (short steps with shuffle, may have difficulty arising from chair, head down, impaired 0 No balance) Mental Status Oriented to own ability 0 Yes Electronic Signature(s) Signed: 10/19/2019 5:54:40 PM By: Baruch Gouty RN, BSN Entered By: Baruch Gouty on 10/19/2019 11:32:24 -------------------------------------------------------------------------------- Foot Assessment Details Patient Name: Date of Service: Gary Carroll, Gary Carroll 10/19/2019 10:30 A M Medical Record Number: [id number] Patient Account Number: [id number] Date of Birth/Sex: Treating RN: 05-Feb-1946 (74 y.o. Gary Carroll Primary Care Cidney Kirkwood: Joline Salt HN Other Clinician: Referring Berlin Mokry: Treating Murriel Holwerda/Extender: Otho Perl HN Weeks in Treatment: 0 Foot Assessment Items Site Locations + = Sensation present, - = Sensation absent, C = Callus, U = Ulcer R = Redness, W = Warmth, M = Maceration, PU = Pre-ulcerative lesion F = Fissure, S = Swelling, D = Dryness Assessment Right: Left: Other Deformity: No No Prior Foot Ulcer: No No Prior Amputation: No No Charcot Joint: No No Ambulatory Status:  Ambulatory With Help Assistance Device: Cane Gait: Steady Electronic Signature(s) Signed: 10/19/2019 5:54:40 PM By: Baruch Gouty RN, BSN Entered By: Baruch Gouty on 10/19/2019 11:33:33 -------------------------------------------------------------------------------- Nutrition Risk Screening Details Patient Name: Date of Service: Gary Arias RLES M. 10/19/2019 10:30 A M Medical Record Number: [id number] Patient Account Number: [id number] Date of Birth/Sex: Treating RN: May 04, 1946 (74 y.o. Gary Carroll Primary Care Brodie Scovell: Joline Salt HN Other Clinician: Referring Kyro Joswick: Treating Gary Carroll/Extender: Otho Perl HN Weeks in Treatment: 0 Height (in): 66 Weight (lbs): 272 Body Mass Index (BMI): 43.9 Nutrition Risk Screening Items Score Screening NUTRITION RISK SCREEN: I have an illness or condition that made me change the kind and/or amount of food I eat 0 No I eat fewer than two meals per day 0 No I eat few fruits and vegetables, or milk products 0 No I have three or more drinks of beer, liquor or wine almost every day 0 No I have tooth or mouth problems that make it hard for me to eat 0 No I don't always have enough money to buy the food I need 0 No I eat alone most of the time 1 Yes I take three or more different prescribed or over-the-counter drugs a day 1 Yes Without wanting to, I have lost or gained 10 pounds in the last six months 0 No I am not always physically able to shop, cook and/or feed myself 0 No Nutrition Protocols Good Risk Protocol 0 No interventions needed Moderate Risk Protocol High Risk Proctocol Risk Level: Good Risk Score: 2 Electronic Signature(s) Signed: 10/19/2019 5:54:40 PM By: Baruch Gouty RN, BSN Entered By: Baruch Gouty on 10/19/2019 11:33:19

## 2019-10-20 NOTE — Progress Notes (Signed)
Gary Carroll, Gary Carroll ([id number]) Visit Report for 10/19/2019 Chief Complaint Document Details Patient Name: Date of Service: Gary Carroll. 10/19/2019 10:30 A M Medical Record Number: [id number] Patient Account Number: [id number] Date of Birth/Sex: Treating RN: [birth year] (74 y.o. Gary Carroll) Carlene Coria Primary Care Provider: Joline Salt HN Other Clinician: Referring Provider: Treating Provider/Extender: Otho Perl HN Weeks in Treatment: 0 Information Obtained from: Patient Chief Complaint 10/19/2019; patient arrives with concern for a left buttock wound Electronic Signature(s) Signed: 10/19/2019 5:52:05 PM By: Linton Ham MD Entered By: Linton Ham on 10/19/2019 12:50:35 -------------------------------------------------------------------------------- HPI Details Patient Name: Date of Service: Gary Carroll, [place]. 10/19/2019 10:30 A M Medical Record Number: [id number] Patient Account Number: [id number] Date of Birth/Sex: Treating RN: [birth year] (74 y.o. Gary Carroll) Carlene Coria Primary Care Provider: Joline Salt HN Other Clinician: Referring Provider: Treating Provider/Extender: Otho Perl HN Weeks in Treatment: 0 History of Present Illness HPI Description: Admission 10/19/2019 This is a 74 year old man who is still active but somewhat immobile. Uses a cane to walk. Sleeps a lot of the time in the recliner. For the last 2 months he has had an area on his left buttock that was a wound. He has had difficulty getting this to close. He was seen by his primary doctor I think at the [place] and has been using DuoDERM over this. He is here for our review. He does not have a prior history of pressure ulcers. 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 He has urinary retention and does in and out caths Electronic Signature(s) Signed: 10/19/2019 5:52:05 PM By: Linton Ham MD Entered By: Linton Ham on 10/19/2019 12:52:54 -------------------------------------------------------------------------------- Physical Exam Details Patient Name: Date of Service: Gary Carroll, [place]. 10/19/2019 10:30 A M Medical Record Number: [id number] Patient Account Number: [id number] Date of Birth/Sex: Treating RN: [birth year] (74 y.o. Gary Carroll) Carlene Coria Primary Care Provider: Joline Salt HN Other Clinician: Referring Provider: Treating Provider/Extender: Otho Perl HN Weeks in Treatment: 0 Constitutional Patient is hypertensive.. Pulse regular and within target range for patient.Marland Kitchen Respirations regular, non-labored and within target range.. Temperature is normal and within the target range for the patient.Marland Kitchen Appears in no distress. Integumentary (Hair, Skin) No primary skin issues were seen. Notes Wound exam; the area the patient was concerned about was on the left buttock near the gluteal cleft mid aspect. There is some scaling skin here but nothing open underneath this. No evidence of surrounding erythema Electronic Signature(s) Signed: 10/19/2019 5:52:05 PM By: Linton Ham MD Entered By: Linton Ham on 10/19/2019 12:53:50 -------------------------------------------------------------------------------- Physician Orders Details Patient Name: Date of Service: Gary Carroll, [place]. 10/19/2019 10:30 A M Medical Record Number: [id number] Patient Account Number: [id number] Date of Birth/Sex: Treating RN: 04/10/1946 (74 y.o. Gary Carroll) Carlene Coria Primary Care Provider: Joline Salt HN Other Clinician: Referring Provider: Treating Provider/Extender: Otho Perl HN Weeks in Treatment: 0 Verbal / Phone Orders: No Diagnosis Coding Discharge From [facility] Services Discharge from [place] - patient  to use pressure relief methods at all times, patient to pad area with foam or thick bandaid Electronic Signature(s) Signed: 10/19/2019 5:52:05 PM By: Linton Ham MD Signed: 10/20/2019 4:36:43 PM By: Carlene Coria RN Entered By: Dolores Lory  Carrie on 10/19/2019 12:26:46 -------------------------------------------------------------------------------- Problem List Details Patient Name: Date of Service: Gary Arias RLES M. 10/19/2019 10:30 A M Medical Record Number: [id number] Patient Account Number: [id number] Date of Birth/Sex: Treating RN: [birth year] (74 y.o. Gary Carroll) Carlene Coria Primary Care Provider: Joline Salt HN Other Clinician: Referring Provider: Treating Provider/Extender: Otho Perl HN Weeks in Treatment: 0 Active Problems ICD-10 Encounter Code Description Active Date MDM Diagnosis L89.329 Pressure ulcer of left buttock, unspecified stage 10/19/2019 No Yes Inactive Problems Resolved Problems Electronic Signature(s) Signed: 10/19/2019 5:52:05 PM By: Linton Ham MD Entered By: Linton Ham on 10/19/2019 12:47:20 -------------------------------------------------------------------------------- Progress Note Details Patient Name: Date of Service: Gary Carroll, [place]. 10/19/2019 10:30 A M Medical Record Number: [id number] Patient Account Number: [id number] Date of Birth/Sex: Treating RN: 1945/11/06 (74 y.o. Gary Carroll) Carlene Coria Primary Care Provider: Joline Salt HN Other Clinician: Referring Provider: Treating Provider/Extender: Otho Perl HN Weeks in Treatment: 0 Subjective Chief Complaint Information obtained from Patient 10/19/2019; patient arrives with concern for a left buttock wound History of Present Illness (HPI) Admission 10/19/2019 This is a 74 year old man who is still active but somewhat immobile. Uses a cane to walk. Sleeps a lot of the time in the recliner. For the last 2 months he has had an area on his left buttock that was a wound. He has had  difficulty getting this to close. He was seen by his primary doctor I think at the [place] and has been using DuoDERM over this. He is here for our review. He does not have a prior history of pressure ulcers. 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 He has urinary retention and does in and out caths Patient History Information obtained from Patient. Allergies No Known Allergies Family History Cancer - Mother, Heart Disease - Father, Stroke - Father, No family history of Diabetes, Hereditary Spherocytosis, Hypertension, Kidney Disease, Seizures, Thyroid Problems, Tuberculosis. Social History Former smoker - quit 5-6 yrs ago, Marital Status - Divorced, Alcohol Use - Never, Drug Use - No History, Caffeine Use - Daily. Medical History Eyes Patient has history of Cataracts - mild Denies history of Glaucoma, Optic Neuritis Ear/Nose/Mouth/Throat Denies history of Chronic sinus problems/congestion, Middle ear problems Respiratory Patient has history of Sleep Apnea - uses CPAP Cardiovascular Patient has history of Arrhythmia - afib, Hypertension Endocrine Patient has history of Type II Diabetes Denies history of Type I Diabetes Genitourinary Denies history of End Stage Renal Disease Integumentary (Skin) Denies history of History of Burn Musculoskeletal Patient has history of Osteoarthritis Neurologic Denies history of Dementia, Neuropathy, Quadriplegia, Paraplegia, Seizure Disorder Oncologic Denies history of Received Chemotherapy, Received Radiation Psychiatric Denies history of Anorexia/bulimia, Confinement Anxiety Patient is treated with Oral Agents. Blood sugar is not tested. Hospitalization/Surgery History - bil knee  replacements. - TURP. - umbilical hernia repair. - cholecystectomy. Medical A Surgical History Notes nd Constitutional Symptoms (General Health) obesity Ear/Nose/Mouth/Throat tinnitis Cardiovascular hyperlipidemia, aortic valve stenosis Gastrointestinal constipation, GERD Genitourinary TURP, in and out cath Review of Systems (ROS) Constitutional Symptoms (General Health) Denies complaints or symptoms of Fatigue, Fever, Chills, Marked Weight Change. Eyes Complains or has symptoms of Glasses / Contacts. Denies complaints or symptoms of Dry Eyes, Vision Changes. Ear/Nose/Mouth/Throat Denies complaints or symptoms of Chronic sinus problems or rhinitis.  Respiratory Complains or has symptoms of Shortness of Breath - with walking. Cardiovascular Denies complaints or symptoms of Chest pain. Genitourinary Denies complaints or symptoms of Frequent urination. Integumentary (Skin) Complains or has symptoms of Wounds - left buttock. Musculoskeletal Complains or has symptoms of Muscle Weakness. Neurologic Denies complaints or symptoms of Numbness/parasthesias. Psychiatric Denies complaints or symptoms of Claustrophobia, Suicidal. Objective Constitutional Patient is hypertensive.. Pulse regular and within target range for patient.Marland Kitchen Respirations regular, non-labored and within target range.. Temperature is normal and within the target range for the patient.Marland Kitchen Appears in no distress. Vitals Time Taken: 11:15 AM, Height: 66 in, Source: Stated, Weight: 272 lbs, Source: Stated, BMI: 43.9, Temperature: 97.8 F, Pulse: 62 bpm, Respiratory Rate: 18 breaths/min, Blood Pressure: 147/53 mmHg. General Notes: Wound exam; the area the patient was concerned about was on the left buttock near the gluteal cleft mid aspect. There is some scaling skin here but nothing open underneath this. No evidence of surrounding erythema Integumentary (Hair, Skin) No primary skin issues were seen. Assessment Active  Problems ICD-10 Pressure ulcer of left buttock, unspecified stage Plan Discharge From [facility] Services: Discharge from [place] - patient to use pressure relief methods at all times, patient to pad area with foam or thick bandaid 1. The patient does not have an open wound. The extent of his wound if there was 1 is unclear. 2. The patient is really not very mobile. I went over with him the issue about sitting for most of the day in a recliner. He expressed understanding although he told me he does not sleep in this purposely although he sometimes finds himself there in the middle of the night. 3. This does not need to be specifically dressed although a doing thermal or foam border would be reasonable. I went over offloading issues with him 4. The patient does not need to follow here Electronic Signature(s) Signed: 10/19/2019 5:52:05 PM By: Linton Ham MD Entered By: Linton Ham on 10/19/2019 12:55:43 -------------------------------------------------------------------------------- HxROS Details Patient Name: Date of Service: Gary Carroll, [place]. 10/19/2019 10:30 A M Medical Record Number: [id number] Patient Account Number: [id number] Date of Birth/Sex: Treating RN: Oct 02, 1945 (74 y.o. Gary Carroll Primary Care Provider: Joline Salt HN Other Clinician: Referring Provider: Treating Provider/Extender: Otho Perl HN Weeks in Treatment: 0 Information Obtained From Patient Constitutional Symptoms (General Health) Complaints and Symptoms: Negative for: Fatigue; Fever; Chills; Marked Weight Change Medical History: Past Medical History Notes: obesity Eyes Complaints and Symptoms: Positive for: Glasses / Contacts Negative for: Dry Eyes; Vision Changes Medical History: Positive for: Cataracts - mild Negative for: Glaucoma; Optic Neuritis Ear/Nose/Mouth/Throat Complaints and Symptoms: Negative for: Chronic sinus problems or rhinitis Medical  History: Negative for: Chronic sinus problems/congestion; Middle ear problems Past Medical History Notes: tinnitis Respiratory Complaints and Symptoms: Positive for: Shortness of Breath - with walking Medical History: Positive for: Sleep Apnea - uses CPAP Cardiovascular Complaints and Symptoms: Negative for: Chest pain Medical History: Positive for: Arrhythmia - afib; Hypertension Past Medical History Notes: hyperlipidemia, aortic valve stenosis Genitourinary Complaints and Symptoms: Negative for: Frequent urination Medical History: Negative for: End Stage Renal Disease Past Medical History Notes: TURP, in and out cath Integumentary (Skin) Complaints and Symptoms: Positive for: Wounds - left buttock Medical History: Negative for: History of Burn Musculoskeletal Complaints and Symptoms: Positive for: Muscle Weakness Medical History: Positive for: Osteoarthritis Neurologic Complaints and Symptoms: Negative for: Numbness/parasthesias Medical History: Negative for: Dementia; Neuropathy; Quadriplegia; Paraplegia; Seizure Disorder Psychiatric Complaints and Symptoms:  Negative for: Claustrophobia; Suicidal Medical History: Negative for: Anorexia/bulimia; Confinement Anxiety Hematologic/Lymphatic Gastrointestinal Medical History: Past Medical History Notes: constipation, GERD Endocrine Medical History: Positive for: Type II Diabetes Negative for: Type I Diabetes Time with diabetes: 2 weeks Treated with: Oral agents Blood sugar tested every day: No Immunological Oncologic Medical History: Negative for: Received Chemotherapy; Received Radiation HBO Extended History Items Eyes: Cataracts Immunizations Pneumococcal Vaccine: Received Pneumococcal Vaccination: Yes Implantable Devices Yes Hospitalization / Surgery History Type of Hospitalization/Surgery bil knee replacements TURP umbilical hernia repair cholecystectomy Family and Social History Cancer: Yes -  Mother; Diabetes: No; Heart Disease: Yes - Father; Hereditary Spherocytosis: No; Hypertension: No; Kidney Disease: No; Seizures: No; Stroke: Yes - Father; Thyroid Problems: No; Tuberculosis: No; Former smoker - quit 5-6 yrs ago; Marital Status - Divorced; Alcohol Use: Never; Drug Use: No History; Caffeine Use: Daily; Financial Concerns: No; Food, Clothing or Shelter Needs: No; Support System Lacking: No; Transportation Concerns: No Engineer, maintenance) Signed: 10/19/2019 5:52:05 PM By: Linton Ham MD Signed: 10/19/2019 5:54:40 PM By: Baruch Gouty RN, BSN Entered By: Baruch Gouty on 10/19/2019 11:29:50 -------------------------------------------------------------------------------- [place] Details Patient Name: Date of Service: Gary Carroll, Gary RLES M. 10/19/2019 Medical Record Number: [id number] Patient Account Number: [id number] Date of Birth/Sex: Treating RN: [birth year] (73 y.o. Gary Carroll) Carlene Coria Primary Care Provider: Joline Salt HN Other Clinician: Referring Provider: Treating Provider/Extender: Otho Perl HN Weeks in Treatment: 0 Diagnosis Coding ICD-10 Codes Code Description [id number] Pressure ulcer of left buttock, unspecified stage Facility Procedures CPT4 Code: [id number] Description: [id number] - WOUND CARE VISIT-LEV 3 EST PT Modifier: Quantity: 1 Physician Procedures : CPT4 Code Description Modifier [id number] - WC PHYS LEVEL 2 - NEW PT ICD-10 Diagnosis Description [id number] Pressure ulcer of left buttock, unspecified stage Quantity: 1 Electronic Signature(s) Signed: 10/19/2019 5:52:05 PM By: Linton Ham MD Signed: 10/20/2019 4:36:43 PM By: Carlene Coria RN Entered By: Carlene Coria on 10/19/2019 13:29:25

## 2019-10-20 NOTE — Progress Notes (Signed)
PERCIVAL, KURATA ([id number]) Visit Report for 10/19/2019 Allergy List Details Patient Name: Date of Service: Glorious Peach. 10/19/2019 10:30 A M Medical Record Number: [id number] Patient Account Number: [id number] Date of Birth/Sex: Treating RN: [birth year] (74 y.o. Ernestene Mention Primary Care Lavetta Geier: Joline Salt HN Other Clinician: Referring Mavery Milling: Treating Kalleigh Harbor/Extender: Otho Perl HN Weeks in Treatment: 0 Allergies Active Allergies No Known Allergies Allergy Notes Electronic Signature(s) Signed: 10/19/2019 5:54:40 PM By: Baruch Gouty RN, BSN Entered By: Baruch Gouty on 10/19/2019 10:27:37 -------------------------------------------------------------------------------- Arrival Information Details Patient Name: Date of Service: Hector Brunswick, CHA RLES M. 10/19/2019 10:30 A M Medical Record Number: [id number] Patient Account Number: [id number] Date of Birth/Sex: Treating RN: May 11, 1946 (74 y.o. Ernestene Mention Primary Care Vennie Waymire: Joline Salt HN Other Clinician: Referring Demara Lover: Treating Dominic Rhome/Extender: Otho Perl HN Weeks in Treatment: 0 Visit Information Patient Arrived: Cane Arrival Time: 11:13 Accompanied By: self Transfer Assistance: None Patient Identification Verified: Yes Secondary Verification Process Completed: Yes Patient Requires Transmission-Based Precautions: No Patient Has Alerts: No Electronic Signature(s) Signed: 10/19/2019 5:54:40 PM By: Baruch Gouty RN, BSN Entered By: Baruch Gouty on 10/19/2019 11:15:03 -------------------------------------------------------------------------------- Clinic Level of Care Assessment Details Patient Name: Date of Service: Despina Arias RLES M. 10/19/2019 10:30 A M Medical Record Number: [id number] Patient Account Number: [id number] Date of Birth/Sex: Treating RN: 1945-05-22 (73 y.o. Jerilynn Mages) Carlene Coria Primary Care Shaheen Star: Joline Salt HN Other Clinician: Referring  Alaila Pillard: Treating Zian Mohamed/Extender: Jerry Caras, JO HN Weeks in Treatment: 0 Clinic Level of Care Assessment Items TOOL 2 Quantity Score X- 1 0 Use when only an EandM is performed on the INITIAL visit ASSESSMENTS - Nursing Assessment / Reassessment X- 1 20 General Physical Exam (combine w/ comprehensive assessment (listed just below) when performed on new pt. evals) X- 1 25 Comprehensive Assessment (HX, ROS, Risk Assessments, Wounds Hx, etc.) ASSESSMENTS - Wound and Skin A ssessment / Reassessment X - Simple Wound Assessment / Reassessment - one wound 1 5 []  - 0 Complex Wound Assessment / Reassessment - multiple wounds []  - 0 Dermatologic / Skin Assessment (not related to wound area) ASSESSMENTS - Ostomy and/or Continence Assessment and Care []  - 0 Incontinence Assessment and Management []  - 0 Ostomy Care Assessment and Management (repouching, etc.) PROCESS - Coordination of Care X - Simple Patient / Family Education for ongoing care 1 15 []  - 0 Complex (extensive) Patient / Family Education for ongoing care X- 1 10 Staff obtains Programmer, systems, Records, T Results / Process Orders est []  - 0 Staff telephones HHA, Nursing Homes / Clarify orders / etc []  - 0 Routine Transfer to another Facility (non-emergent condition) []  - 0 Routine Hospital Admission (non-emergent condition) X- 1 15 New Admissions / Biomedical engineer / Ordering NPWT Apligraf, etc. , []  - 0 Emergency Hospital Admission (emergent condition) X- 1 10 Simple Discharge Coordination []  - 0 Complex (extensive) Discharge Coordination PROCESS - Special Needs []  - 0 Pediatric / Minor Patient Management []  - 0 Isolation Patient Management []  - 0 Hearing / Language / Visual special needs []  - 0 Assessment of Community assistance (transportation, D/C planning, etc.) []  - 0 Additional assistance / Altered mentation []  - 0 Support Surface(s) Assessment (bed, cushion, seat, etc.) INTERVENTIONS -  Wound Cleansing / Measurement []  - 0 Wound Imaging (photographs - any number of wounds) []  - 0 Wound Tracing (instead of photographs) []  - 0 Simple Wound Measurement - one wound []  - 0  Complex Wound Measurement - multiple wounds []  - 0 Simple Wound Cleansing - one wound []  - 0 Complex Wound Cleansing - multiple wounds INTERVENTIONS - Wound Dressings []  - 0 Small Wound Dressing one or multiple wounds []  - 0 Medium Wound Dressing one or multiple wounds []  - 0 Large Wound Dressing one or multiple wounds []  - 0 Application of Medications - injection INTERVENTIONS - Miscellaneous []  - 0 External ear exam []  - 0 Specimen Collection (cultures, biopsies, blood, body fluids, etc.) []  - 0 Specimen(s) / Culture(s) sent or taken to Lab for analysis []  - 0 Patient Transfer (multiple staff / Harrel Lemon Lift / Similar devices) []  - 0 Simple Staple / Suture removal (25 or less) []  - 0 Complex Staple / Suture removal (26 or more) []  - 0 Hypo / Hyperglycemic Management (close monitor of Blood Glucose) []  - 0 Ankle / Brachial Index (ABI) - do not check if billed separately Has the patient been seen at the hospital within the last three years: Yes Total Score: 100 Level Of Care: New/Established - Level 3 Electronic Signature(s) Signed: 10/20/2019 4:36:43 PM By: Carlene Coria RN Entered By: Carlene Coria on 10/19/2019 12:25:16 -------------------------------------------------------------------------------- Encounter Discharge Information Details Patient Name: Date of Service: Hector Brunswick, CHA RLES M. 10/19/2019 10:30 A M Medical Record Number: [id number] Patient Account Number: [id number] Date of Birth/Sex: Treating RN: [birth year] (73 y.o. Jerilynn Mages) Carlene Coria Primary Care Rainy Rothman: Joline Salt HN Other Clinician: Referring Krish Bailly: Treating Tascha Casares/Extender: Otho Perl HN Weeks in Treatment: 0 Encounter Discharge Information Items Discharge Condition: Stable Ambulatory Status:  Cane Discharge Destination: Home Transportation: Private Auto Accompanied By: self Schedule Follow-up Appointment: Yes Clinical Summary of Care: Patient Declined Electronic Signature(s) Signed: 10/20/2019 4:36:43 PM By: Carlene Coria RN Entered By: Carlene Coria on 10/19/2019 12:29:09 -------------------------------------------------------------------------------- Lower Extremity Assessment Details Patient Name: Date of Service: Despina Arias RLES M. 10/19/2019 10:30 A M Medical Record Number: [id number] Patient Account Number: [id number] Date of Birth/Sex: Treating RN: [birth year] (74 y.o. Ernestene Mention Primary Care Cybil Senegal: Joline Salt HN Other Clinician: Referring Samariya Rockhold: Treating Dewitte Vannice/Extender: Otho Perl HN Weeks in Treatment: 0 Electronic Signature(s) Signed: 10/19/2019 5:54:40 PM By: Baruch Gouty RN, BSN Signed: 10/19/2019 5:54:40 PM By: Baruch Gouty RN, BSN Entered By: Baruch Gouty on 10/19/2019 11:33:44 -------------------------------------------------------------------------------- [place] Details Patient Name: Date of Service: Hector Brunswick, [place]. 10/19/2019 10:30 A M Medical Record Number: [id number] Patient Account Number: [id number] Date of Birth/Sex: Treating RN: 1945/06/29 (73 y.o. Oval Linsey Primary Care Jeralyn Nolden: Joline Salt HN Other Clinician: Referring Eleanor Dimichele: Treating Dorna Mallet/Extender: Otho Perl HN Weeks in Treatment: 0 Active Inactive Electronic Signature(s) Signed: 10/20/2019 4:36:43 PM By: Carlene Coria RN Entered By: Carlene Coria on 10/19/2019 12:23:20 -------------------------------------------------------------------------------- Pain Assessment Details Patient Name: Date of Service: Despina Arias RLES M. 10/19/2019 10:30 A M Medical Record Number: [id number] Patient Account Number: [id number] Date of Birth/Sex: Treating RN: [birth year] (74 y.o. Ernestene Mention Primary Care  Jalal Rauch: Joline Salt HN Other Clinician: Referring Shilee Biggs: Treating Derrico Zhong/Extender: Otho Perl HN Weeks in Treatment: 0 Active Problems Location of Pain Severity and Description of Pain Patient Has Paino No Site Locations Rate the pain. Current Pain Level: 0 Pain Management and Medication Current Pain Management: Electronic Signature(s) Signed: 10/19/2019 5:54:40 PM By: Baruch Gouty RN, BSN Entered By: Baruch Gouty on 10/19/2019 11:39:56 -------------------------------------------------------------------------------- Patient/Caregiver Education Details Patient Name: Date of Service: Hector Brunswick, CHA RLES  M. 6/1/2021andnbsp10:30 A M Medical Record Number: [id number] Patient Account Number: [id number] Date of Birth/Gender: Treating RN: 1945/07/15 (73 y.o. Jerilynn Mages) Carlene Coria Primary Care Physician: Joline Salt HN Other Clinician: Referring Physician: Treating Physician/Extender: Otho Perl HN Weeks in Treatment: 0 Education Assessment Education Provided To: Patient Education Topics Provided Wound/Skin Impairment: Methods: Explain/Verbal Responses: State content correctly Electronic Signature(s) Signed: 10/20/2019 4:36:43 PM By: Carlene Coria RN Entered By: Carlene Coria on 10/19/2019 12:23:32 -------------------------------------------------------------------------------- Vitals Details Patient Name: Date of Service: Hector Brunswick, [place]. 10/19/2019 10:30 A M Medical Record Number: [id number] Patient Account Number: [id number] Date of Birth/Sex: Treating RN: Aug 23, 1945 (74 y.o. Ernestene Mention Primary Care Davione Lenker: Joline Salt HN Other Clinician: Referring Yang Rack: Treating Nickholas Goldston/Extender: Otho Perl HN Weeks in Treatment: 0 Vital Signs Time Taken: 11:15 Temperature (F): 97.8 Height (in): 66 Pulse (bpm): 62 Source: Stated Respiratory Rate (breaths/min): 18 Weight (lbs): 272 Blood Pressure (mmHg):  147/53 Source: Stated Reference Range: 80 - 120 mg / dl Body Mass Index (BMI): 43.9 Electronic Signature(s) Signed: 10/19/2019 5:54:40 PM By: Baruch Gouty RN, BSN Entered By: Baruch Gouty on 10/19/2019 11:17:20

## 2019-10-27 DIAGNOSIS — E785 Hyperlipidemia, unspecified: Secondary | ICD-10-CM | POA: Diagnosis not present

## 2019-10-27 DIAGNOSIS — J441 Chronic obstructive pulmonary disease with (acute) exacerbation: Secondary | ICD-10-CM | POA: Diagnosis not present

## 2019-10-27 DIAGNOSIS — N403 Nodular prostate with lower urinary tract symptoms: Secondary | ICD-10-CM | POA: Diagnosis not present

## 2019-11-05 ENCOUNTER — Ambulatory Visit
Admission: EM | Admit: 2019-11-05 | Discharge: 2019-11-05 | Disposition: A | Discharge status: Home / Self Care | Payer: Medicare HMO | Attending: Emergency Medicine | Admitting: Emergency Medicine

## 2019-11-05 ENCOUNTER — Encounter: Payer: Self-pay | Admitting: Emergency Medicine

## 2019-11-05 DIAGNOSIS — H60333 Swimmer's ear, bilateral: Secondary | ICD-10-CM | POA: Diagnosis not present

## 2019-11-05 MED ORDER — FLUTICASONE PROPIONATE 50 MCG/ACT NA SUSP: 1.0000 | Freq: Every day | NASAL | 0 refills | Status: DC

## 2019-11-05 MED ORDER — CIPROFLOXACIN-DEXAMETHASONE 0.3-0.1 % OT SUSP: 4.0000 [drp] | Freq: Two times a day (BID) | OTIC | 0 refills | Status: DC

## 2019-11-05 NOTE — Discharge Instructions (Addendum)
Rest and drink plenty of fluids Prescribed ciprodex ear drops Take medications as directed and to completion Continue to use OTC ibuprofen and/ or tylenol as needed for pain control Follow up with PCP if symptoms persists Return here or go to the ER if you have any new or worsening symptoms

## 2019-11-05 NOTE — ED Provider Notes (Signed)
[place]   [id number] 11/05/19 Arrival Time: 5638  Chief Complaint  Patient presents with   Otalgia     SUBJECTIVE: History from: patient.  Gary Carroll is a 75 y.o. male who presents w to the urgent care with a complaint of bilateral ear pain started yesterday after swimming.  Reported left worse than right.  Patient states the pain is constant and achy in character.  Has not tried any OTC medication.  Symptoms are made worse with lying down.  Denies similar symptoms in the past.    Denies fever, chills, fatigue, sinus pain, rhinorrhea, ear discharge, sore throat, SOB, wheezing, chest pain, nausea, changes in bowel or bladder habits.    ROS: As per HPI.  All other pertinent ROS negative.     Past Medical History:  Diagnosis Date   Allergic rhinitis    Arthritis    Asthma    seasonal per pt   Bilateral knee pain    OA   Diverticulosis 02/2012   seen on colonoscopy   Erectile dysfunction    GERD (gastroesophageal reflux disease)    Heart murmur    Hiatal hernia    HLD (hyperlipidemia)    HTN (hypertension)    Hydronephrosis determined by ultrasound 08/22/2015   Overactive bladder    Prediabetes    Ringing of ears    Sleep apnea    Urinary frequency    Urinary urgency    Wears glasses    Past Surgical History:  Procedure Laterality Date   CHOLECYSTECTOMY     COLONOSCOPY  02/2012   Dr. 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   Current Outpatient Medications on File Prior to Encounter  Medication Sig Dispense Refill   apixaban (ELIQUIS) 5 MG TABS tablet Take 1 tablet (5 mg total) by mouth 2 (two) times daily. 60 tablet 2   Calcium Carb-Cholecalciferol (CALCIUM 500 +D PO) Take by mouth.     diphenhydrAMINE (BENADRYL) 25 MG tablet Take 50 mg by mouth at bedtime as needed for sleep.     furosemide (LASIX) 40 MG tablet Take 40 mg by mouth daily.     loratadine (CLARITIN) 10 MG tablet Take 10 mg by mouth daily.     MAGNESIUM PO Take by mouth.     Melatonin 5 MG TABS Take 10 mg by mouth at bedtime.      metoprolol tartrate (LOPRESSOR) 25 MG tablet Take 1 tablet (25 mg total) by mouth 2 (two) times daily. (Patient taking differently: Take 25 mg by mouth 2 (two) times daily. Two Tablets in the morning and one in the Evening.) 60 tablet 2   Multiple Vitamins-Minerals (MULTIVITAL) tablet Take 1 tablet by mouth daily.       Omega-3 Fatty Acids (FISH OIL) 1000 MG CAPS Take 4 capsules by mouth daily.      oxybutynin (DITROPAN) 5 MG tablet Take 5 mg by mouth daily.  potassium chloride (K-DUR) 10 MEQ tablet Take 1 tablet (10 mEq total) by mouth daily. 30 tablet 0   pravastatin (PRAVACHOL) 40 MG tablet      vitamin C (ASCORBIC ACID) 500 MG tablet Take 500 mg by mouth daily.     Social History   Socioeconomic History   Marital status: Divorced    Spouse name: Not on file   Number of children: 0   Years of education: Not on file   Highest education level: Not on file  Occupational History   Occupation: retired Land)    Employer: RETIRED  Tobacco Use   Smoking status: Former Smoker    Types: Cigarettes    Quit date: 01/18/2010    Years since quitting: 9.8   Smokeless tobacco: Never Used   Tobacco comment: Smoked for 25 years- quit September 2011.  Passive exposure from wife  Substance and Sexual Activity   Alcohol use: Not  Currently    Alcohol/week: 1.0 - 2.0 standard drink    Types: 1 - 2 Glasses of wine per week    Comment: Stopped drinking 1-2 glasses of wine daily in 2017    Drug use: No   Sexual activity: Not Currently  Other Topics Concern   Not on file  Social History Narrative   Separated from wife (2014) and moved to [place]. Retired- worked in Theatre manager.    Social Determinants of Health   Financial Resource Strain:    Difficulty of Paying Living Expenses:   Food Insecurity:    Worried About Charity fundraiser in the Last Year:    Arboriculturist in the Last Year:   Transportation Needs:    Film/video editor (Medical):    Lack of Transportation (Non-Medical):   Physical Activity:    Days of Exercise per Week:    Minutes of Exercise per Session:   Stress:    Feeling of Stress :   Social Connections:    Frequency of Communication with Friends and Family:    Frequency of Social Gatherings with Friends and Family:    Attends Religious Services:    Active Member of Clubs or Organizations:    Attends Music therapist:    Marital Status:   Intimate Partner Violence:    Fear of Current or Ex-Partner:    Emotionally Abused:    Physically Abused:    Sexually Abused:    Family History  Problem Relation Age of Onset   Uterine cancer Mother    Cancer Mother        uterine   Coronary artery disease Father    Coronary artery disease Paternal Grandfather    Heart disease Paternal Grandfather    Colon cancer Neg Hx    Esophageal cancer Neg Hx    Stomach cancer Neg Hx    Rectal cancer Neg Hx    Liver disease Neg Hx     OBJECTIVE:  Vitals:   11/05/19 1721 11/05/19 1723  BP:  (!) 136/59  Pulse:  (!) 59  Resp:  19  Temp:  98.2 F (36.8 C)  TempSrc:  Oral  SpO2:  95%  Weight: 269 lb (122 kg)   Height: 5\' 11"  (1.803 m)     Physical Exam Vitals reviewed.  Constitutional:      General: He is not in acute distress.     Appearance: Normal appearance. He is normal weight. He is not ill-appearing, toxic-appearing or diaphoretic.  HENT:     Head: Normocephalic  and atraumatic.     Right Ear: Hearing and external ear normal. Swelling and tenderness present. No drainage. There is no impacted cerumen.     Left Ear: Hearing and external ear normal. Swelling and tenderness present. No drainage. There is no impacted cerumen.     Ears:     Comments: Ear: Bilateral ear canal tenderness and swelling.  Pain present when pulling both ear tragus Cardiovascular:     Rate and Rhythm: Normal rate and regular rhythm.     Pulses: Normal pulses.     Heart sounds: Normal heart sounds. No murmur heard.  No friction rub. No gallop.   Pulmonary:     Effort: Pulmonary effort is normal. No respiratory distress.     Breath sounds: Normal breath sounds. No stridor. No wheezing, rhonchi or rales.  Chest:     Chest wall: No tenderness.  Neurological:     Mental Status: He is alert.     Imaging: No results found.   ASSESSMENT & PLAN:  1. Acute swimmer's ear of both sides     Meds ordered this encounter  Medications   ciprofloxacin-dexamethasone (CIPRODEX) OTIC suspension    Sig: Place 4 drops into both ears 2 (two) times daily.    Dispense:  7.5 mL    Refill:  0   fluticasone (FLONASE) 50 MCG/ACT nasal spray    Sig: Place 1 spray into both nostrils daily for 14 days.    Dispense:  16 g    Refill:  0   Discharge instructions Rest and drink plenty of fluids Prescribed ciprodex ear drops Take medications as directed and to completion Continue to use OTC ibuprofen and/ or tylenol as needed for pain control Follow up with PCP if symptoms persists Return here or go to the ER if you have any new or worsening symptoms   Reviewed expectations re: course of current medical issues. Questions answered. Outlined signs and symptoms indicating need for more acute intervention. Patient verbalized understanding. After Visit  Summary given.      Note: This document was prepared using Dragon voice recognition software and may include unintentional dictation errors.    Emerson Monte, FNP 11/05/19 1756

## 2019-11-05 NOTE — ED Triage Notes (Addendum)
LT ear pain that started yesterday after swimming that has gotten worse and today his RT ear began to have pain.  Pt also reports sneezing and scratchy throat.

## 2019-11-06 DIAGNOSIS — H9203 Otalgia, bilateral: Secondary | ICD-10-CM | POA: Diagnosis not present

## 2019-11-06 DIAGNOSIS — H6122 Impacted cerumen, left ear: Secondary | ICD-10-CM | POA: Diagnosis not present

## 2019-11-06 DIAGNOSIS — M1A9XX1 Chronic gout, unspecified, with tophus (tophi): Secondary | ICD-10-CM | POA: Diagnosis not present

## 2019-11-22 DIAGNOSIS — R69 Illness, unspecified: Secondary | ICD-10-CM | POA: Diagnosis not present

## 2019-11-22 DIAGNOSIS — R5381 Other malaise: Secondary | ICD-10-CM | POA: Diagnosis not present

## 2019-11-22 DIAGNOSIS — E782 Mixed hyperlipidemia: Secondary | ICD-10-CM | POA: Diagnosis not present

## 2019-11-25 DIAGNOSIS — M810 Age-related osteoporosis without current pathological fracture: Secondary | ICD-10-CM | POA: Diagnosis not present

## 2019-11-25 DIAGNOSIS — J069 Acute upper respiratory infection, unspecified: Secondary | ICD-10-CM | POA: Diagnosis not present

## 2019-11-25 DIAGNOSIS — E782 Mixed hyperlipidemia: Secondary | ICD-10-CM | POA: Diagnosis not present

## 2019-11-26 DIAGNOSIS — R509 Fever, unspecified: Secondary | ICD-10-CM | POA: Diagnosis not present

## 2019-11-26 DIAGNOSIS — I10 Essential (primary) hypertension: Secondary | ICD-10-CM | POA: Diagnosis not present

## 2019-11-26 DIAGNOSIS — R531 Weakness: Secondary | ICD-10-CM | POA: Diagnosis not present

## 2020-01-14 ENCOUNTER — Ambulatory Visit: Payer: PRIVATE HEALTH INSURANCE | Admitting: Cardiovascular Disease

## 2020-01-19 DIAGNOSIS — M810 Age-related osteoporosis without current pathological fracture: Secondary | ICD-10-CM | POA: Diagnosis not present

## 2020-01-19 DIAGNOSIS — I110 Hypertensive heart disease with heart failure: Secondary | ICD-10-CM | POA: Diagnosis not present

## 2020-01-19 DIAGNOSIS — E7849 Other hyperlipidemia: Secondary | ICD-10-CM | POA: Diagnosis not present

## 2020-01-19 DIAGNOSIS — I5032 Chronic diastolic (congestive) heart failure: Secondary | ICD-10-CM | POA: Diagnosis not present

## 2020-02-16 DIAGNOSIS — M8000XS Age-related osteoporosis with current pathological fracture, unspecified site, sequela: Secondary | ICD-10-CM | POA: Diagnosis not present

## 2020-02-16 DIAGNOSIS — R7301 Impaired fasting glucose: Secondary | ICD-10-CM | POA: Diagnosis not present

## 2020-02-16 DIAGNOSIS — R509 Fever, unspecified: Secondary | ICD-10-CM | POA: Diagnosis not present

## 2020-02-16 DIAGNOSIS — L89322 Pressure ulcer of left buttock, stage 2: Secondary | ICD-10-CM | POA: Diagnosis not present

## 2020-02-16 DIAGNOSIS — I5042 Chronic combined systolic (congestive) and diastolic (congestive) heart failure: Secondary | ICD-10-CM | POA: Diagnosis not present

## 2020-02-16 DIAGNOSIS — R601 Generalized edema: Secondary | ICD-10-CM | POA: Diagnosis not present

## 2020-02-16 DIAGNOSIS — Z712 Person consulting for explanation of examination or test findings: Secondary | ICD-10-CM | POA: Diagnosis not present

## 2020-02-16 DIAGNOSIS — L89159 Pressure ulcer of sacral region, unspecified stage: Secondary | ICD-10-CM | POA: Diagnosis not present

## 2020-02-16 DIAGNOSIS — I482 Chronic atrial fibrillation, unspecified: Secondary | ICD-10-CM | POA: Diagnosis not present

## 2020-02-16 DIAGNOSIS — R011 Cardiac murmur, unspecified: Secondary | ICD-10-CM | POA: Diagnosis not present

## 2020-02-19 DIAGNOSIS — R69 Illness, unspecified: Secondary | ICD-10-CM | POA: Diagnosis not present

## 2020-03-01 DIAGNOSIS — M8000XS Age-related osteoporosis with current pathological fracture, unspecified site, sequela: Secondary | ICD-10-CM | POA: Diagnosis not present

## 2020-03-01 DIAGNOSIS — Z712 Person consulting for explanation of examination or test findings: Secondary | ICD-10-CM | POA: Diagnosis not present

## 2020-03-01 DIAGNOSIS — I10 Essential (primary) hypertension: Secondary | ICD-10-CM | POA: Diagnosis not present

## 2020-03-01 DIAGNOSIS — R601 Generalized edema: Secondary | ICD-10-CM | POA: Diagnosis not present

## 2020-03-01 DIAGNOSIS — N390 Urinary tract infection, site not specified: Secondary | ICD-10-CM | POA: Diagnosis not present

## 2020-03-01 DIAGNOSIS — R509 Fever, unspecified: Secondary | ICD-10-CM | POA: Diagnosis not present

## 2020-03-01 DIAGNOSIS — I482 Chronic atrial fibrillation, unspecified: Secondary | ICD-10-CM | POA: Diagnosis not present

## 2020-03-01 DIAGNOSIS — R7301 Impaired fasting glucose: Secondary | ICD-10-CM | POA: Diagnosis not present

## 2020-03-01 DIAGNOSIS — I5042 Chronic combined systolic (congestive) and diastolic (congestive) heart failure: Secondary | ICD-10-CM | POA: Diagnosis not present

## 2020-03-01 DIAGNOSIS — L89322 Pressure ulcer of left buttock, stage 2: Secondary | ICD-10-CM | POA: Diagnosis not present

## 2020-03-01 DIAGNOSIS — L89159 Pressure ulcer of sacral region, unspecified stage: Secondary | ICD-10-CM | POA: Diagnosis not present

## 2020-03-01 DIAGNOSIS — R011 Cardiac murmur, unspecified: Secondary | ICD-10-CM | POA: Diagnosis not present

## 2020-03-01 DIAGNOSIS — C61 Malignant neoplasm of prostate: Secondary | ICD-10-CM | POA: Diagnosis not present

## 2020-03-16 DIAGNOSIS — E7849 Other hyperlipidemia: Secondary | ICD-10-CM | POA: Diagnosis not present

## 2020-03-16 DIAGNOSIS — I5032 Chronic diastolic (congestive) heart failure: Secondary | ICD-10-CM | POA: Diagnosis not present

## 2020-03-16 DIAGNOSIS — I110 Hypertensive heart disease with heart failure: Secondary | ICD-10-CM | POA: Diagnosis not present

## 2020-03-16 DIAGNOSIS — E119 Type 2 diabetes mellitus without complications: Secondary | ICD-10-CM | POA: Diagnosis not present

## 2020-03-21 DIAGNOSIS — I10 Essential (primary) hypertension: Secondary | ICD-10-CM | POA: Diagnosis not present

## 2020-03-21 DIAGNOSIS — R339 Retention of urine, unspecified: Secondary | ICD-10-CM | POA: Diagnosis not present

## 2020-03-21 DIAGNOSIS — N3000 Acute cystitis without hematuria: Secondary | ICD-10-CM | POA: Diagnosis not present

## 2020-03-22 DIAGNOSIS — R300 Dysuria: Secondary | ICD-10-CM | POA: Diagnosis not present

## 2020-03-22 DIAGNOSIS — R601 Generalized edema: Secondary | ICD-10-CM | POA: Diagnosis not present

## 2020-03-22 DIAGNOSIS — Z712 Person consulting for explanation of examination or test findings: Secondary | ICD-10-CM | POA: Diagnosis not present

## 2020-03-22 DIAGNOSIS — L89159 Pressure ulcer of sacral region, unspecified stage: Secondary | ICD-10-CM | POA: Diagnosis not present

## 2020-03-22 DIAGNOSIS — N3000 Acute cystitis without hematuria: Secondary | ICD-10-CM | POA: Diagnosis not present

## 2020-03-22 DIAGNOSIS — R509 Fever, unspecified: Secondary | ICD-10-CM | POA: Diagnosis not present

## 2020-03-22 DIAGNOSIS — M8000XS Age-related osteoporosis with current pathological fracture, unspecified site, sequela: Secondary | ICD-10-CM | POA: Diagnosis not present

## 2020-03-22 DIAGNOSIS — R339 Retention of urine, unspecified: Secondary | ICD-10-CM | POA: Diagnosis not present

## 2020-03-22 DIAGNOSIS — R011 Cardiac murmur, unspecified: Secondary | ICD-10-CM | POA: Diagnosis not present

## 2020-03-22 DIAGNOSIS — R7301 Impaired fasting glucose: Secondary | ICD-10-CM | POA: Diagnosis not present

## 2020-03-22 DIAGNOSIS — L89322 Pressure ulcer of left buttock, stage 2: Secondary | ICD-10-CM | POA: Diagnosis not present

## 2020-03-22 DIAGNOSIS — I482 Chronic atrial fibrillation, unspecified: Secondary | ICD-10-CM | POA: Diagnosis not present

## 2020-03-22 DIAGNOSIS — I5042 Chronic combined systolic (congestive) and diastolic (congestive) heart failure: Secondary | ICD-10-CM | POA: Diagnosis not present

## 2020-04-05 DIAGNOSIS — R601 Generalized edema: Secondary | ICD-10-CM | POA: Diagnosis not present

## 2020-04-05 DIAGNOSIS — I5042 Chronic combined systolic (congestive) and diastolic (congestive) heart failure: Secondary | ICD-10-CM | POA: Diagnosis not present

## 2020-04-05 DIAGNOSIS — Z712 Person consulting for explanation of examination or test findings: Secondary | ICD-10-CM | POA: Diagnosis not present

## 2020-04-05 DIAGNOSIS — L89322 Pressure ulcer of left buttock, stage 2: Secondary | ICD-10-CM | POA: Diagnosis not present

## 2020-04-05 DIAGNOSIS — I482 Chronic atrial fibrillation, unspecified: Secondary | ICD-10-CM | POA: Diagnosis not present

## 2020-04-05 DIAGNOSIS — R011 Cardiac murmur, unspecified: Secondary | ICD-10-CM | POA: Diagnosis not present

## 2020-04-05 DIAGNOSIS — R7301 Impaired fasting glucose: Secondary | ICD-10-CM | POA: Diagnosis not present

## 2020-04-05 DIAGNOSIS — L89159 Pressure ulcer of sacral region, unspecified stage: Secondary | ICD-10-CM | POA: Diagnosis not present

## 2020-04-05 DIAGNOSIS — M8000XS Age-related osteoporosis with current pathological fracture, unspecified site, sequela: Secondary | ICD-10-CM | POA: Diagnosis not present

## 2020-04-05 DIAGNOSIS — R509 Fever, unspecified: Secondary | ICD-10-CM | POA: Diagnosis not present

## 2020-04-05 DIAGNOSIS — R339 Retention of urine, unspecified: Secondary | ICD-10-CM | POA: Diagnosis not present

## 2020-04-11 DIAGNOSIS — E1169 Type 2 diabetes mellitus with other specified complication: Secondary | ICD-10-CM | POA: Diagnosis not present

## 2020-04-11 DIAGNOSIS — D649 Anemia, unspecified: Secondary | ICD-10-CM | POA: Diagnosis not present

## 2020-04-11 DIAGNOSIS — G4730 Sleep apnea, unspecified: Secondary | ICD-10-CM | POA: Diagnosis not present

## 2020-04-11 DIAGNOSIS — R918 Other nonspecific abnormal finding of lung field: Secondary | ICD-10-CM | POA: Diagnosis not present

## 2020-04-11 DIAGNOSIS — E782 Mixed hyperlipidemia: Secondary | ICD-10-CM | POA: Diagnosis not present

## 2020-04-11 DIAGNOSIS — I10 Essential (primary) hypertension: Secondary | ICD-10-CM | POA: Diagnosis not present

## 2020-04-11 DIAGNOSIS — M810 Age-related osteoporosis without current pathological fracture: Secondary | ICD-10-CM | POA: Diagnosis not present

## 2020-04-11 DIAGNOSIS — R600 Localized edema: Secondary | ICD-10-CM | POA: Diagnosis not present

## 2020-04-11 DIAGNOSIS — R339 Retention of urine, unspecified: Secondary | ICD-10-CM | POA: Diagnosis not present

## 2020-05-17 DIAGNOSIS — R059 Cough, unspecified: Secondary | ICD-10-CM | POA: Diagnosis not present

## 2020-05-17 DIAGNOSIS — I10 Essential (primary) hypertension: Secondary | ICD-10-CM | POA: Diagnosis not present

## 2020-05-17 DIAGNOSIS — L89322 Pressure ulcer of left buttock, stage 2: Secondary | ICD-10-CM | POA: Diagnosis not present

## 2020-05-17 DIAGNOSIS — I482 Chronic atrial fibrillation, unspecified: Secondary | ICD-10-CM | POA: Diagnosis not present

## 2020-05-17 DIAGNOSIS — R7301 Impaired fasting glucose: Secondary | ICD-10-CM | POA: Diagnosis not present

## 2020-05-17 DIAGNOSIS — R011 Cardiac murmur, unspecified: Secondary | ICD-10-CM | POA: Diagnosis not present

## 2020-05-17 DIAGNOSIS — R509 Fever, unspecified: Secondary | ICD-10-CM | POA: Diagnosis not present

## 2020-05-17 DIAGNOSIS — L89159 Pressure ulcer of sacral region, unspecified stage: Secondary | ICD-10-CM | POA: Diagnosis not present

## 2020-05-17 DIAGNOSIS — R601 Generalized edema: Secondary | ICD-10-CM | POA: Diagnosis not present

## 2020-05-17 DIAGNOSIS — R197 Diarrhea, unspecified: Secondary | ICD-10-CM | POA: Diagnosis not present

## 2020-05-17 DIAGNOSIS — R300 Dysuria: Secondary | ICD-10-CM | POA: Diagnosis not present

## 2020-05-17 DIAGNOSIS — R339 Retention of urine, unspecified: Secondary | ICD-10-CM | POA: Diagnosis not present

## 2020-05-17 DIAGNOSIS — M8000XS Age-related osteoporosis with current pathological fracture, unspecified site, sequela: Secondary | ICD-10-CM | POA: Diagnosis not present

## 2020-05-17 DIAGNOSIS — I5042 Chronic combined systolic (congestive) and diastolic (congestive) heart failure: Secondary | ICD-10-CM | POA: Diagnosis not present

## 2020-05-17 DIAGNOSIS — Z712 Person consulting for explanation of examination or test findings: Secondary | ICD-10-CM | POA: Diagnosis not present

## 2020-05-19 DIAGNOSIS — R531 Weakness: Secondary | ICD-10-CM | POA: Diagnosis not present

## 2020-05-19 DIAGNOSIS — R011 Cardiac murmur, unspecified: Secondary | ICD-10-CM | POA: Diagnosis not present

## 2020-05-19 DIAGNOSIS — I110 Hypertensive heart disease with heart failure: Secondary | ICD-10-CM | POA: Diagnosis not present

## 2020-05-19 DIAGNOSIS — I5032 Chronic diastolic (congestive) heart failure: Secondary | ICD-10-CM | POA: Diagnosis not present

## 2020-05-19 DIAGNOSIS — I482 Chronic atrial fibrillation, unspecified: Secondary | ICD-10-CM | POA: Diagnosis not present

## 2020-05-19 DIAGNOSIS — R509 Fever, unspecified: Secondary | ICD-10-CM | POA: Diagnosis not present

## 2020-06-22 DIAGNOSIS — R059 Cough, unspecified: Secondary | ICD-10-CM | POA: Diagnosis not present

## 2020-06-22 DIAGNOSIS — R339 Retention of urine, unspecified: Secondary | ICD-10-CM | POA: Diagnosis not present

## 2020-06-22 DIAGNOSIS — R918 Other nonspecific abnormal finding of lung field: Secondary | ICD-10-CM | POA: Diagnosis not present

## 2020-06-22 DIAGNOSIS — N319 Neuromuscular dysfunction of bladder, unspecified: Secondary | ICD-10-CM | POA: Diagnosis not present

## 2020-06-22 DIAGNOSIS — I10 Essential (primary) hypertension: Secondary | ICD-10-CM | POA: Diagnosis not present

## 2020-07-17 DIAGNOSIS — I10 Essential (primary) hypertension: Secondary | ICD-10-CM | POA: Diagnosis not present

## 2020-07-17 DIAGNOSIS — I2510 Atherosclerotic heart disease of native coronary artery without angina pectoris: Secondary | ICD-10-CM | POA: Diagnosis not present

## 2020-07-17 DIAGNOSIS — R339 Retention of urine, unspecified: Secondary | ICD-10-CM | POA: Diagnosis not present

## 2020-07-17 DIAGNOSIS — I700 Atherosclerosis of aorta: Secondary | ICD-10-CM | POA: Diagnosis not present

## 2020-07-17 DIAGNOSIS — I480 Paroxysmal atrial fibrillation: Secondary | ICD-10-CM | POA: Diagnosis not present

## 2020-07-17 DIAGNOSIS — I5042 Chronic combined systolic (congestive) and diastolic (congestive) heart failure: Secondary | ICD-10-CM | POA: Diagnosis not present

## 2020-07-17 DIAGNOSIS — E782 Mixed hyperlipidemia: Secondary | ICD-10-CM | POA: Diagnosis not present

## 2020-07-17 DIAGNOSIS — M810 Age-related osteoporosis without current pathological fracture: Secondary | ICD-10-CM | POA: Diagnosis not present

## 2020-07-17 DIAGNOSIS — E1169 Type 2 diabetes mellitus with other specified complication: Secondary | ICD-10-CM | POA: Diagnosis not present

## 2020-08-15 DIAGNOSIS — E7849 Other hyperlipidemia: Secondary | ICD-10-CM | POA: Diagnosis not present

## 2020-08-15 DIAGNOSIS — I110 Hypertensive heart disease with heart failure: Secondary | ICD-10-CM | POA: Diagnosis not present

## 2020-08-15 DIAGNOSIS — R7301 Impaired fasting glucose: Secondary | ICD-10-CM | POA: Diagnosis not present

## 2020-08-15 DIAGNOSIS — I5042 Chronic combined systolic (congestive) and diastolic (congestive) heart failure: Secondary | ICD-10-CM | POA: Diagnosis not present

## 2020-08-15 DIAGNOSIS — E1169 Type 2 diabetes mellitus with other specified complication: Secondary | ICD-10-CM | POA: Diagnosis not present

## 2020-08-15 DIAGNOSIS — I5032 Chronic diastolic (congestive) heart failure: Secondary | ICD-10-CM | POA: Diagnosis not present

## 2020-08-15 DIAGNOSIS — I482 Chronic atrial fibrillation, unspecified: Secondary | ICD-10-CM | POA: Diagnosis not present

## 2020-08-15 DIAGNOSIS — E119 Type 2 diabetes mellitus without complications: Secondary | ICD-10-CM | POA: Diagnosis not present

## 2020-08-15 DIAGNOSIS — Z6835 Body mass index (BMI) 35.0-35.9, adult: Secondary | ICD-10-CM | POA: Diagnosis not present

## 2020-08-15 DIAGNOSIS — I2510 Atherosclerotic heart disease of native coronary artery without angina pectoris: Secondary | ICD-10-CM | POA: Diagnosis not present

## 2020-08-22 DIAGNOSIS — I480 Paroxysmal atrial fibrillation: Secondary | ICD-10-CM | POA: Diagnosis not present

## 2020-08-22 DIAGNOSIS — M810 Age-related osteoporosis without current pathological fracture: Secondary | ICD-10-CM | POA: Diagnosis not present

## 2020-08-22 DIAGNOSIS — E1169 Type 2 diabetes mellitus with other specified complication: Secondary | ICD-10-CM | POA: Diagnosis not present

## 2020-08-22 DIAGNOSIS — I5042 Chronic combined systolic (congestive) and diastolic (congestive) heart failure: Secondary | ICD-10-CM | POA: Diagnosis not present

## 2020-08-22 DIAGNOSIS — G4730 Sleep apnea, unspecified: Secondary | ICD-10-CM | POA: Diagnosis not present

## 2020-08-22 DIAGNOSIS — I10 Essential (primary) hypertension: Secondary | ICD-10-CM | POA: Diagnosis not present

## 2020-08-22 DIAGNOSIS — R339 Retention of urine, unspecified: Secondary | ICD-10-CM | POA: Diagnosis not present

## 2020-08-22 DIAGNOSIS — I700 Atherosclerosis of aorta: Secondary | ICD-10-CM | POA: Diagnosis not present

## 2020-08-22 DIAGNOSIS — I2510 Atherosclerotic heart disease of native coronary artery without angina pectoris: Secondary | ICD-10-CM | POA: Diagnosis not present

## 2020-08-22 DIAGNOSIS — E782 Mixed hyperlipidemia: Secondary | ICD-10-CM | POA: Diagnosis not present

## 2020-09-17 DIAGNOSIS — I480 Paroxysmal atrial fibrillation: Secondary | ICD-10-CM | POA: Diagnosis not present

## 2020-09-17 DIAGNOSIS — I2510 Atherosclerotic heart disease of native coronary artery without angina pectoris: Secondary | ICD-10-CM | POA: Diagnosis not present

## 2020-09-17 DIAGNOSIS — E782 Mixed hyperlipidemia: Secondary | ICD-10-CM | POA: Diagnosis not present

## 2020-09-17 DIAGNOSIS — I10 Essential (primary) hypertension: Secondary | ICD-10-CM | POA: Diagnosis not present

## 2020-09-17 DIAGNOSIS — R339 Retention of urine, unspecified: Secondary | ICD-10-CM | POA: Diagnosis not present

## 2020-09-17 DIAGNOSIS — I5042 Chronic combined systolic (congestive) and diastolic (congestive) heart failure: Secondary | ICD-10-CM | POA: Diagnosis not present

## 2020-09-17 DIAGNOSIS — E1169 Type 2 diabetes mellitus with other specified complication: Secondary | ICD-10-CM | POA: Diagnosis not present

## 2020-09-17 DIAGNOSIS — G4730 Sleep apnea, unspecified: Secondary | ICD-10-CM | POA: Diagnosis not present

## 2020-09-17 DIAGNOSIS — N401 Enlarged prostate with lower urinary tract symptoms: Secondary | ICD-10-CM | POA: Diagnosis not present

## 2020-09-25 DIAGNOSIS — I2510 Atherosclerotic heart disease of native coronary artery without angina pectoris: Secondary | ICD-10-CM | POA: Diagnosis not present

## 2020-09-25 DIAGNOSIS — E1169 Type 2 diabetes mellitus with other specified complication: Secondary | ICD-10-CM | POA: Diagnosis not present

## 2020-09-25 DIAGNOSIS — G4730 Sleep apnea, unspecified: Secondary | ICD-10-CM | POA: Diagnosis not present

## 2020-09-25 DIAGNOSIS — R339 Retention of urine, unspecified: Secondary | ICD-10-CM | POA: Insufficient documentation

## 2020-09-25 DIAGNOSIS — I480 Paroxysmal atrial fibrillation: Secondary | ICD-10-CM | POA: Diagnosis not present

## 2020-09-25 DIAGNOSIS — I5042 Chronic combined systolic (congestive) and diastolic (congestive) heart failure: Secondary | ICD-10-CM | POA: Diagnosis not present

## 2020-09-25 DIAGNOSIS — M810 Age-related osteoporosis without current pathological fracture: Secondary | ICD-10-CM | POA: Insufficient documentation

## 2020-09-25 DIAGNOSIS — N401 Enlarged prostate with lower urinary tract symptoms: Secondary | ICD-10-CM | POA: Insufficient documentation

## 2020-09-25 DIAGNOSIS — I10 Essential (primary) hypertension: Secondary | ICD-10-CM | POA: Diagnosis not present

## 2020-09-25 DIAGNOSIS — I4891 Unspecified atrial fibrillation: Secondary | ICD-10-CM | POA: Diagnosis present

## 2020-09-25 DIAGNOSIS — E782 Mixed hyperlipidemia: Secondary | ICD-10-CM | POA: Diagnosis not present

## 2020-09-25 DIAGNOSIS — E119 Type 2 diabetes mellitus without complications: Secondary | ICD-10-CM | POA: Insufficient documentation

## 2020-10-13 ENCOUNTER — Ambulatory Visit
Admission: EM | Admit: 2020-10-13 | Discharge: 2020-10-13 | Disposition: A | Discharge status: Home / Self Care | Payer: Medicare HMO | Attending: Emergency Medicine | Admitting: Emergency Medicine

## 2020-10-13 ENCOUNTER — Encounter: Payer: Self-pay | Admitting: Emergency Medicine

## 2020-10-13 ENCOUNTER — Other Ambulatory Visit: Payer: Self-pay

## 2020-10-13 DIAGNOSIS — Z20822 Contact with and (suspected) exposure to covid-19: Secondary | ICD-10-CM | POA: Diagnosis not present

## 2020-10-13 DIAGNOSIS — R0981 Nasal congestion: Secondary | ICD-10-CM

## 2020-10-13 DIAGNOSIS — H6122 Impacted cerumen, left ear: Secondary | ICD-10-CM

## 2020-10-13 NOTE — Discharge Instructions (Signed)

## 2020-10-13 NOTE — ED Triage Notes (Signed)
Pt sts left sided ear pain and nasal congestion upon waking this am; pt requests covid testing

## 2020-10-13 NOTE — ED Provider Notes (Signed)
[facility]   [id number] 10/13/20 Arrival Time: 1301   CC: COVID symptoms  SUBJECTIVE: History from: patient.  Gary Carroll is a 75 y.o. male who presents with ear pian and nasal congestion that began this morning.  Requests covid test.  Denies sick exposure to COVID, flu or strep.  Denies recent travel.  Has not tried OTC medications.  Denies aggravating factors.  Reports previous symptoms in the past.   Denies fever, chills, sore throat, SOB, wheezing, chest pain, nausea, changes in bowel or bladder habits.     ROS: As per HPI.  All other pertinent ROS negative.     Past Medical History:  Diagnosis Date  . Allergic rhinitis   . Arthritis   . Asthma    seasonal per pt  . Bilateral knee pain    OA  . Diverticulosis 02/2012   seen on colonoscopy  . Erectile dysfunction   . GERD (gastroesophageal reflux disease)   . Heart murmur   . Hiatal hernia   . HLD (hyperlipidemia)   . HTN (hypertension)   . Hydronephrosis determined by ultrasound 08/22/2015  . Overactive bladder   . Prediabetes   . Ringing of ears   . Sleep apnea   . Urinary frequency   . Urinary urgency   . Wears glasses    Past Surgical History:  Procedure Laterality Date  . CHOLECYSTECTOMY    . COLONOSCOPY  02/2012   Dr. Deatra Ina: sigmoid, transverse, and ascending colon diverticulosis  . FINGER SURGERY Right   . prostate procedure  2017   per patient, was having trouble urinating   . TONSILLECTOMY    . 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 Lorre Nick)  . TOTAL KNEE ARTHROPLASTY Right 06/19/2015   Procedure: TOTAL KNEE ARTHROPLASTY;  Surgeon: Vickey Huger, MD;  Location: [facility];  Service: Orthopedics;  Laterality: Right;   Allergies  Allergen Reactions  . Diovan [Valsartan] Cough  . Tagamet [Cimetidine] Other (See Comments)    irrittability   No current facility-administered medications on file prior to encounter.   Current Outpatient Medications on File Prior to Encounter  Medication  Sig Dispense Refill  . apixaban (ELIQUIS) 5 MG TABS tablet Take 1 tablet (5 mg total) by mouth 2 (two) times daily. 60 tablet 2  . Calcium Carb-Cholecalciferol (CALCIUM 500 +D PO) Take by mouth.    . ciprofloxacin-dexamethasone (CIPRODEX) OTIC suspension Place 4 drops into both ears 2 (two) times daily. (Patient not taking: Reported on 10/13/2020) 7.5 mL 0  . diphenhydrAMINE (BENADRYL) 25 MG tablet Take 50 mg by mouth at bedtime as needed for sleep.    . fluticasone (FLONASE) 50 MCG/ACT nasal spray Place 1 spray into both nostrils daily for 14 days. 16 g 0  . furosemide (LASIX) 40 MG tablet Take 40 mg by mouth daily.    Marland Kitchen loratadine (CLARITIN) 10 MG tablet Take 10 mg by mouth daily.    Marland Kitchen MAGNESIUM PO Take by mouth.    . Melatonin 5 MG TABS Take 10 mg by mouth at bedtime.     . metoprolol tartrate (LOPRESSOR) 25 MG tablet Take 1 tablet (25 mg total) by mouth 2 (two) times daily. (Patient taking differently: Take 25 mg by mouth 2 (two) times daily. Two Tablets in the morning and one in the Evening.) 60 tablet 2  . Multiple Vitamins-Minerals (MULTIVITAL) tablet Take 1 tablet by mouth daily.      . Omega-3 Fatty Acids (FISH OIL) 1000 MG CAPS Take  4 capsules by mouth daily.     Marland Kitchen oxybutynin (DITROPAN) 5 MG tablet Take 5 mg by mouth daily.    . potassium chloride (K-DUR) 10 MEQ tablet Take 1 tablet (10 mEq total) by mouth daily. 30 tablet 0  . pravastatin (PRAVACHOL) 40 MG tablet     . vitamin C (ASCORBIC ACID) 500 MG tablet Take 500 mg by mouth daily.     Social History   Socioeconomic History  . Marital status: Divorced    Spouse name: Not on file  . Number of children: 0  . Years of education: Not on file  . Highest education level: Not on file  Occupational History  . Occupation: retired Land)    Employer: RETIRED  Tobacco Use  . Smoking status: Former Smoker    Types: Cigarettes    Quit date: 01/18/2010    Years since quitting: 10.7  . Smokeless tobacco: Never Used  . Tobacco  comment: Smoked for 25 years- quit September 2011.  Passive exposure from wife  Substance and Sexual Activity  . Alcohol use: Not Currently    Alcohol/week: 1.0 - 2.0 standard drink    Types: 1 - 2 Glasses of wine per week    Comment: Stopped drinking 1-2 glasses of wine daily in 2017   . Drug use: No  . Sexual activity: Not Currently  Other Topics Concern  . Not on file  Social History Narrative   Separated from wife (2014) and moved to [place]. Retired- worked in Theatre manager.    Social Determinants of Health   Financial Resource Strain: Not on file  Food Insecurity: Not on file  Transportation Needs: Not on file  Physical Activity: Not on file  Stress: Not on file  Social Connections: Not on file  Intimate Partner Violence: Not on file   Family History  Problem Relation Age of Onset  . Uterine cancer Mother   . Cancer Mother        uterine  . Coronary artery disease Father   . Coronary artery disease Paternal Grandfather   . Heart disease Paternal Grandfather   . Colon cancer Neg Hx   . Esophageal cancer Neg Hx   . Stomach cancer Neg Hx   . Rectal cancer Neg Hx   . Liver disease Neg Hx     OBJECTIVE:  Vitals:   10/13/20 1310  BP: (!) 153/68  Pulse: (!) 58  Resp: 18  Temp: 97.6 F (36.4 C)  TempSrc: Oral  SpO2: 95%     General appearance: alert; well-appearing, nontoxic; speaking in full sentences and tolerating own secretions HEENT: NCAT; Ears: RT EAC clear, LT EAC obstructed by cerumen; RT TM pearly gray; Eyes: PERRL.  EOM grossly intact. Sinuses: nontender; Nose: nares patent without rhinorrhea, Throat: oropharynx clear, tonsils non erythematous or enlarged, uvula midline  Neck: supple without LAD Lungs: unlabored respirations, symmetrical air entry; cough: absent; no respiratory distress; CTAB Heart: regular rate and rhythm.   Skin: warm and dry Psychological: alert and cooperative; normal mood and affect   PROCEDURE:  Consent granted.  LT ear  lavage performed by RN.  TM visualized.  PT tolerated procedure well.     ASSESSMENT & PLAN:  1. Encounter for screening laboratory testing for COVID-19 virus   2. Nasal congestion   3. Impacted cerumen of left ear     No orders of the defined types were placed in this encounter.  Ear lavage performed in office COVID testing ordered.  It will take  between 5-7 days for test results.  Someone will contact you regarding abnormal results.    In the meantime: You should remain isolated in your home for 10 days from symptom onset AND greater than 72 hours after symptoms resolution (absence of fever without the use of fever-reducing medication and improvement in respiratory symptoms), whichever is longer Get plenty of rest and push fluids Use OTC zyrtec for nasal congestion, runny nose, and/or sore throat Use OTC flonase for nasal congestion and runny nose Use medications daily for symptom relief Use OTC medications like ibuprofen or tylenol as needed fever or pain Call or go to the ED if you have any new or worsening symptoms such as fever, cough, shortness of breath, chest tightness, chest pain, turning blue, changes in mental status, etc...   Reviewed expectations re: course of current medical issues. Questions answered. Outlined signs and symptoms indicating need for more acute intervention. Patient verbalized understanding. After Visit Summary given.         Lestine Box, PA-C 10/13/20 1400

## 2020-10-14 LAB — SARS-COV-2, NAA 2 DAY TAT

## 2020-10-14 LAB — NOVEL CORONAVIRUS, NAA: SARS-CoV-2, NAA: NOT DETECTED

## 2020-10-17 DIAGNOSIS — I10 Essential (primary) hypertension: Secondary | ICD-10-CM | POA: Diagnosis not present

## 2020-10-17 DIAGNOSIS — I2510 Atherosclerotic heart disease of native coronary artery without angina pectoris: Secondary | ICD-10-CM | POA: Diagnosis not present

## 2020-10-23 ENCOUNTER — Ambulatory Visit (INDEPENDENT_AMBULATORY_CARE_PROVIDER_SITE_OTHER): Payer: No Typology Code available for payment source | Admitting: Emergency Medicine

## 2020-10-23 ENCOUNTER — Encounter: Payer: Self-pay | Admitting: Emergency Medicine

## 2020-10-23 ENCOUNTER — Other Ambulatory Visit: Payer: Self-pay

## 2020-10-23 DIAGNOSIS — Z87891 Personal history of nicotine dependence: Secondary | ICD-10-CM | POA: Insufficient documentation

## 2020-10-23 DIAGNOSIS — G4730 Sleep apnea, unspecified: Secondary | ICD-10-CM

## 2020-10-23 DIAGNOSIS — R9389 Abnormal findings on diagnostic imaging of other specified body structures: Secondary | ICD-10-CM | POA: Insufficient documentation

## 2020-10-23 NOTE — Assessment & Plan Note (Addendum)
Nonspecific peribronchovascular inflammatory changes noted on screening CT chest, waxing and waning.  Unclear etiology.  He does not have any focal symptoms.  Agree with repeat CT in August.  We will review all the CTs at that time.  If any suspicion for opportunistic infection, inflammatory process then we can consider bronchoscopy.  Would not perform at this time.

## 2020-10-23 NOTE — Patient Instructions (Signed)
Get your repeat CT scan at the [facility] in August as planned. Follow Dr. Lamonte Sakai in August after the CT so that we can review together.  We need to get copies of all of your CTs (January, May, August so that we can review together at your follow-up visit. Depending on how your breathing does going forward we can consider performing pulmonary function testing to see if you might benefit from inhaled medication. Continue your CPAP every night as you have been wearing it. Continue your usual exercise routine

## 2020-10-23 NOTE — Assessment & Plan Note (Signed)
Agree with screening CTs.  He is at risk for COPD although his functional capacity and breathing appear to be stable.  We will discuss possible PFTs going forward, consider bronchodilators

## 2020-10-23 NOTE — Progress Notes (Signed)
Subjective:    Patient ID: Gary Carroll, male    DOB: March 22, 1946, 75 y.o.   MRN: [id number]  HPI 75 year old gentleman with a history of tobacco use (47 pack years), GERD with HH, OSA on CPAP - cleans it every few months, osteoarthritis, atrial fibrillation, HTN, aortic stenosis, seasonal asthma, not on BD.   He reports that he has felt well. 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 Feels that he breathes well, he swims a lot. He has some cough, paroxysmal - often assoc w sneezing. Sometimes assoc w meals and reflux - not every day.  Non-productive. He tires easily.   He participates in the lung cancer screening program through the [facility].  He had some micronodular disease noted on a low-dose CT 05/2020 that prompted a round of antibiotics.  His most recent lung cancer screening CT was in May as below.   LDCT 09/19/2020, report available for review.  I do not have the images.  Small foci.  Bronchovascular nodular consolidation in left upper lobe have decreased in size compared with January 2022.  There was note made of a few new small focal nodular areas in the left upper lobe up to 5 mm in size.  A repeat scan was recommended for 3 months (August 2022).   Review of Systems As per HPI  Past Medical History:  Diagnosis Date  . Allergic rhinitis   . Arthritis   . Asthma    seasonal per pt  . Bilateral knee pain    OA  . Diverticulosis 02/2012   seen on colonoscopy  . Erectile dysfunction   . GERD (gastroesophageal reflux disease)   . Heart murmur   . Hiatal hernia   . HLD (hyperlipidemia)   . HTN (hypertension)   . Hydronephrosis determined by ultrasound 08/22/2015  . Overactive bladder   . Prediabetes   . Ringing of ears   . Sleep apnea   . Urinary frequency   . Urinary urgency   . Wears glasses      Family History  Problem Relation Age of Onset  . Uterine cancer Mother   . Cancer Mother        uterine  . Coronary artery disease Father   . Coronary artery disease Paternal Grandfather   .  Heart disease Paternal Grandfather   . Colon cancer Neg Hx   . Esophageal cancer Neg Hx   . Stomach cancer Neg Hx   . Rectal cancer Neg Hx   . Liver disease Neg Hx      Social History   Socioeconomic History  . Marital status: Divorced    Spouse name: Not on file  . Number of children: 0  . Years of education: Not on file  . Highest education level: Not on file  Occupational History  . Occupation: retired Land)    Employer: RETIRED  Tobacco Use  . Smoking status: Former Smoker    Types: Cigarettes    Quit date: 01/18/2010    Years since quitting: 10.7  . Smokeless tobacco: Never Used  . Tobacco comment: Smoked for 25 years- quit September 2011.  Passive exposure from wife  Substance and Sexual Activity  . Alcohol use: Not Currently    Alcohol/week: 1.0 - 2.0 standard drink    Types: 1 - 2 Glasses of wine per week    Comment: Stopped drinking 1-2 glasses of wine daily in 2017   . Drug use: No  . Sexual activity: Not Currently  Other Topics Concern  .  Not on file  Social History Narrative   Separated from wife (2014) and moved to [place]. Retired- worked in Theatre manager.    Social Determinants of Health   Financial Resource Strain: Not on file  Food Insecurity: Not on file  Transportation Needs: Not on file  Physical Activity: Not on file  Stress: Not on file  Social Connections: Not on file  Intimate Partner Violence: Not on file    No family hx lung CA  Allergies  Allergen Reactions  . Diovan [Valsartan] Cough  . Tagamet [Cimetidine] Other (See Comments)    irrittability     Outpatient Medications Prior to Visit  Medication Sig Dispense Refill  . apixaban (ELIQUIS) 5 MG TABS tablet Take 1 tablet (5 mg total) by mouth 2 (two) times daily. 60 tablet 2  . Calcium Carb-Cholecalciferol (CALCIUM 500 +D PO) Take by mouth.    . ciprofloxacin-dexamethasone (CIPRODEX) OTIC suspension Place 4 drops into both ears 2 (two) times daily. 7.5 mL 0  .  diphenhydrAMINE (BENADRYL) 25 MG tablet Take 50 mg by mouth at bedtime as needed for sleep.    . furosemide (LASIX) 40 MG tablet Take 40 mg by mouth daily.    Marland Kitchen loratadine (CLARITIN) 10 MG tablet Take 10 mg by mouth daily.    Marland Kitchen MAGNESIUM PO Take by mouth.    . Melatonin 5 MG TABS Take 10 mg by mouth at bedtime.     . metoprolol tartrate (LOPRESSOR) 25 MG tablet Take 1 tablet (25 mg total) by mouth 2 (two) times daily. (Patient taking differently: Take 25 mg by mouth 2 (two) times daily. Two Tablets in the morning and one in the Evening.) 60 tablet 2  . Multiple Vitamins-Minerals (MULTIVITAL) tablet Take 1 tablet by mouth daily.    . Omega-3 Fatty Acids (FISH OIL) 1000 MG CAPS Take 4 capsules by mouth daily.     Marland Kitchen oxybutynin (DITROPAN) 5 MG tablet Take 5 mg by mouth daily.    . potassium chloride (K-DUR) 10 MEQ tablet Take 1 tablet (10 mEq total) by mouth daily. 30 tablet 0  . pravastatin (PRAVACHOL) 40 MG tablet     . rosuvastatin (CRESTOR) 20 MG tablet Take 20 mg by mouth daily.    . vitamin C (ASCORBIC ACID) 500 MG tablet Take 500 mg by mouth daily.    . fluticasone (FLONASE) 50 MCG/ACT nasal spray Place 1 spray into both nostrils daily for 14 days. 16 g 0   No facility-administered medications prior to visit.   Was in the [employer], flight deck, maintenance.  Has lived in [place], [place] No inhaled exposures.        Objective:   Physical Exam  Vitals:   10/23/20 1450  BP: 130/72  Pulse: 66  Temp: 98.4 F (36.9 C)  TempSrc: Temporal  SpO2: 95%  Weight: 268 lb (121.6 kg)  Height: 5\' 11"  (1.803 m)   Gen: Pleasant, obese man, in no distress,  normal affect  ENT: No lesions,  mouth clear,  oropharynx clear, no postnasal drip  Neck: No JVD, no stridor  Lungs: No use of accessory muscles, no crackles or wheezing on normal respiration, no wheeze on forced expiration  Cardiovascular: RRR, harsh holosystolic murmur with intact S2.  2+ bilateral lower extremity edema with  compression hose in place  Musculoskeletal: No deformities, no cyanosis or clubbing  Neuro: alert, awake, non focal  Skin: Warm, no lesions or rash      Assessment & Plan:  Sleep  apnea Continue your CPAP every night as you have been wearing it.  Abnormal CT of the chest Nonspecific peribronchovascular inflammatory changes noted on screening CT chest, waxing and waning.  Unclear etiology.  He does not have any focal symptoms.  Agree with repeat CT in August.  We will review all the CTs at that time.  If any suspicion for opportunistic infection, inflammatory process then we can consider bronchoscopy.  Would not perform at this time.  History of tobacco use Agree with screening CTs.  He is at risk for COPD although his functional capacity and breathing appear to be stable.  We will discuss possible PFTs going forward, consider bronchodilators  Baltazar Apo, MD, PhD 10/23/2020, 3:19 PM [facility] [phone] or if no answer before 7:00PM call [phone] For any issues after 7:00PM please call eLink [phone]

## 2020-10-23 NOTE — Assessment & Plan Note (Signed)
Continue your CPAP every night as you have been wearing it.

## 2020-10-26 ENCOUNTER — Telehealth: Payer: Self-pay | Admitting: Emergency Medicine

## 2020-10-26 NOTE — Telephone Encounter (Signed)
Received fax confirmation that fax was sent successfully.

## 2020-10-26 NOTE — Telephone Encounter (Signed)
Called and spoke with Gary Carroll with the [facility].  Verified the need for OV notes from 6/6 and the fax # as being correct.  Advised I would fax the forms now.  OV faxed.

## 2020-11-01 DIAGNOSIS — D6869 Other thrombophilia: Secondary | ICD-10-CM | POA: Diagnosis not present

## 2020-11-01 DIAGNOSIS — G8929 Other chronic pain: Secondary | ICD-10-CM | POA: Diagnosis not present

## 2020-11-01 DIAGNOSIS — K219 Gastro-esophageal reflux disease without esophagitis: Secondary | ICD-10-CM | POA: Diagnosis not present

## 2020-11-01 DIAGNOSIS — E785 Hyperlipidemia, unspecified: Secondary | ICD-10-CM | POA: Diagnosis not present

## 2020-11-01 DIAGNOSIS — J309 Allergic rhinitis, unspecified: Secondary | ICD-10-CM | POA: Diagnosis not present

## 2020-11-01 DIAGNOSIS — I4891 Unspecified atrial fibrillation: Secondary | ICD-10-CM | POA: Diagnosis not present

## 2020-11-01 DIAGNOSIS — L309 Dermatitis, unspecified: Secondary | ICD-10-CM | POA: Diagnosis not present

## 2020-11-01 DIAGNOSIS — I10 Essential (primary) hypertension: Secondary | ICD-10-CM | POA: Diagnosis not present

## 2020-11-01 DIAGNOSIS — H04129 Dry eye syndrome of unspecified lacrimal gland: Secondary | ICD-10-CM | POA: Diagnosis not present

## 2020-12-05 DIAGNOSIS — I2510 Atherosclerotic heart disease of native coronary artery without angina pectoris: Secondary | ICD-10-CM | POA: Diagnosis not present

## 2020-12-05 DIAGNOSIS — I10 Essential (primary) hypertension: Secondary | ICD-10-CM | POA: Diagnosis not present

## 2020-12-29 DIAGNOSIS — E1169 Type 2 diabetes mellitus with other specified complication: Secondary | ICD-10-CM | POA: Diagnosis not present

## 2020-12-29 DIAGNOSIS — I10 Essential (primary) hypertension: Secondary | ICD-10-CM | POA: Diagnosis not present

## 2021-01-01 DIAGNOSIS — I2510 Atherosclerotic heart disease of native coronary artery without angina pectoris: Secondary | ICD-10-CM | POA: Diagnosis not present

## 2021-01-01 DIAGNOSIS — I5042 Chronic combined systolic (congestive) and diastolic (congestive) heart failure: Secondary | ICD-10-CM | POA: Diagnosis not present

## 2021-01-01 DIAGNOSIS — E1169 Type 2 diabetes mellitus with other specified complication: Secondary | ICD-10-CM | POA: Diagnosis not present

## 2021-01-01 DIAGNOSIS — I10 Essential (primary) hypertension: Secondary | ICD-10-CM | POA: Diagnosis not present

## 2021-01-01 DIAGNOSIS — N401 Enlarged prostate with lower urinary tract symptoms: Secondary | ICD-10-CM | POA: Diagnosis not present

## 2021-01-01 DIAGNOSIS — R339 Retention of urine, unspecified: Secondary | ICD-10-CM | POA: Diagnosis not present

## 2021-01-01 DIAGNOSIS — E782 Mixed hyperlipidemia: Secondary | ICD-10-CM | POA: Diagnosis not present

## 2021-01-01 DIAGNOSIS — Z0001 Encounter for general adult medical examination with abnormal findings: Secondary | ICD-10-CM | POA: Diagnosis not present

## 2021-01-01 DIAGNOSIS — G4730 Sleep apnea, unspecified: Secondary | ICD-10-CM | POA: Diagnosis not present

## 2021-01-01 DIAGNOSIS — I480 Paroxysmal atrial fibrillation: Secondary | ICD-10-CM | POA: Diagnosis not present

## 2021-01-01 DIAGNOSIS — R9439 Abnormal result of other cardiovascular function study: Secondary | ICD-10-CM | POA: Insufficient documentation

## 2021-01-01 DIAGNOSIS — R9389 Abnormal findings on diagnostic imaging of other specified body structures: Secondary | ICD-10-CM | POA: Insufficient documentation

## 2021-01-03 ENCOUNTER — Telehealth: Payer: Self-pay

## 2021-01-03 NOTE — Telephone Encounter (Signed)
LDCT disk received from [facility] via [employer]. Pt has OV on 02/01/21. Disk placed in Dr. Agustina Caroli review folder. MD made aware.

## 2021-01-08 ENCOUNTER — Ambulatory Visit
Admission: EM | Admit: 2021-01-08 | Discharge: 2021-01-08 | Disposition: A | Discharge status: Home / Self Care | Payer: No Typology Code available for payment source | Attending: Family Medicine | Admitting: Family Medicine

## 2021-01-08 DIAGNOSIS — H66002 Acute suppurative otitis media without spontaneous rupture of ear drum, left ear: Secondary | ICD-10-CM | POA: Diagnosis not present

## 2021-01-08 DIAGNOSIS — J019 Acute sinusitis, unspecified: Secondary | ICD-10-CM | POA: Diagnosis not present

## 2021-01-08 MED ORDER — AMOXICILLIN 875 MG PO TABS: 875.0000 mg | ORAL_TABLET | Freq: Two times a day (BID) | ORAL | 0 refills | Status: DC

## 2021-01-08 NOTE — ED Provider Notes (Signed)
[facility]    CSN: [id number] Arrival date & time: 01/08/21  1200      History   Chief Complaint Chief Complaint  Patient presents with   Otalgia    HPI Gary Carroll is a 75 y.o. male.   HPI Patient here today with left ear pain x 3 days. Concern that water may have becomes dislodged in ear. Pain with lying on left side of face with associated left sided nasal congestion, mild cough, and headache. Denies fever, SOB or CP.Taken OTC medication without relief.  Past Medical History:  Diagnosis Date   Allergic rhinitis    Arthritis    Asthma    seasonal per pt   Bilateral knee pain    OA   Diverticulosis 02/2012   seen on colonoscopy   Erectile dysfunction    GERD (gastroesophageal reflux disease)    Heart murmur    Hiatal hernia    HLD (hyperlipidemia)    HTN (hypertension)    Hydronephrosis determined by ultrasound 08/22/2015   Overactive bladder    Prediabetes    Ringing of ears    Sleep apnea    Urinary frequency    Urinary urgency    Wears glasses     Patient Active Problem List   Diagnosis Date Noted   Abnormal CT of the chest 10/23/2020   History of tobacco use 10/23/2020   Chronic knee pain 03/19/2018   Chronic postoperative pain 03/19/2018   Elevated transaminase level    Sleep apnea 10/01/2017   BPH (benign prostatic hyperplasia) 02/04/2017   Urinary tract infection 02/04/2017   Cellulitis of left lower extremity 02/03/2017   GERD (gastroesophageal reflux disease) 02/03/2017   AF (paroxysmal atrial fibrillation) ([place]) 02/03/2017   Sepsis due to cellulitis ([place]) 02/03/2017   New onset atrial fibrillation ([place]) 08/24/2015   Diuresis excessive 08/22/2015   Bladder outlet obstruction 08/22/2015   Hydronephrosis determined by ultrasound 08/22/2015   AKI (acute kidney injury) ([place]) 08/22/2015   Hypernatremia 08/22/2015   Acute blood loss anemia 08/22/2015   Hematuria 08/22/2015   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 Lorre Nick)   TOTAL KNEE ARTHROPLASTY Right 06/19/2015   Procedure: TOTAL KNEE ARTHROPLASTY;  Surgeon: Vickey Huger, MD;  Location: [facility];  Service: Orthopedics;  Laterality: Right;       Home Medications    Prior to Admission medications   Medication Sig Start Date End Date Taking? Authorizing Provider  amoxicillin (AMOXIL) 875 MG tablet Take 1 tablet (875 mg total) by mouth 2 (two) times daily. 01/08/21  Yes Scot Jun, FNP  apixaban (ELIQUIS) 5 MG TABS tablet Take 1 tablet (5 mg total) by mouth 2 (two) times daily. 08/24/15   Isaac Bliss, Rayford Halsted, MD  Calcium Carb-Cholecalciferol (CALCIUM 500 +  D PO) Take by mouth.    [provider]  ciprofloxacin-dexamethasone (CIPRODEX) OTIC suspension Place 4 drops into both ears 2 (two) times daily. 11/05/19   Avegno, Darrelyn Hillock, FNP  diphenhydrAMINE (BENADRYL) 25 MG tablet Take 50 mg by mouth at bedtime as needed for sleep.    [provider]  fluticasone (FLONASE) 50 MCG/ACT nasal spray Place 1 spray into both nostrils daily for 14 days. 11/05/19 11/19/19  Avegno, Darrelyn Hillock, FNP  furosemide (LASIX) 40 MG tablet Take 40 mg by mouth daily. 07/27/19   [provider]  loratadine (CLARITIN) 10 MG tablet Take 10 mg by mouth daily.     [provider]  MAGNESIUM PO Take by mouth.    [provider]  Melatonin 5 MG TABS Take 10 mg by mouth at bedtime.     [provider]  metoprolol tartrate (LOPRESSOR) 25 MG tablet Take 1 tablet (25 mg total) by mouth 2 (two) times daily. Patient taking differently: Take 25 mg by mouth 2 (two) times daily. Two Tablets in the morning and one in the Evening. 08/24/15   Isaac Bliss, Rayford Halsted, MD  Multiple Vitamins-Minerals (MULTIVITAL) tablet Take 1 tablet by mouth daily.    [provider]  Omega-3 Fatty Acids (FISH OIL) 1000 MG CAPS Take 4 capsules by mouth daily.     [provider]  oxybutynin (DITROPAN) 5 MG tablet Take 5 mg by mouth daily. 06/01/19   [provider]  potassium chloride (K-DUR) 10 MEQ tablet Take 1 tablet (10 mEq total) by mouth daily. 10/09/17   Kathie Dike, MD  pravastatin (PRAVACHOL) 40 MG tablet  01/05/19   [provider]  rosuvastatin (CRESTOR) 20 MG tablet Take 20 mg by mouth daily.    [provider]  vitamin C (ASCORBIC ACID) 500 MG tablet Take 500 mg by mouth daily.    [provider]    Family History Family History  Problem Relation Age of Onset   Uterine cancer Mother    Cancer Mother        uterine   Coronary artery disease Father    Coronary artery disease Paternal Grandfather    Heart disease Paternal Grandfather    Colon cancer Neg Hx    Esophageal cancer Neg Hx    Stomach cancer Neg Hx    Rectal cancer Neg Hx    Liver disease Neg Hx     Social History Social History   Tobacco Use   Smoking status: Former    Types: Cigarettes    Quit date: 01/18/2010    Years since quitting: 10.9   Smokeless tobacco: Never   Tobacco comments:    Smoked for 25 years- quit September 2011.  Passive exposure from wife  Substance Use Topics   Alcohol use: Not Currently    Alcohol/week: 1.0 - 2.0 standard drink    Types: 1 - 2 Glasses of wine per week    Comment: Stopped  drinking 1-2 glasses of wine daily in 2017    Drug use: No     Allergies   Diovan [valsartan] and Tagamet [cimetidine]   Review of Systems Review of Systems Pertinent negatives listed in HPI   Physical Exam Triage Vital Signs ED Triage Vitals  Enc Vitals Group     BP 01/08/21 1301 (!) 172/76     Pulse Rate 01/08/21 1301 61     Resp 01/08/21 1301 20     Temp 01/08/21 1301 98.5 F (36.9 C)  Temp src --      SpO2 01/08/21 1301 94 %     Weight --      Height --      Head Circumference --      Peak Flow --      Pain Score 01/08/21 1300 4     Pain Loc --      Pain Edu? --      Excl. in [place]? --    No data found.  Updated Vital Signs BP (!) 172/76   Pulse 61   Temp 98.5 F (36.9 C)   Resp 20   SpO2 94%   Visual Acuity Right Eye Distance:   Left Eye Distance:   Bilateral Distance:    Right Eye Near:   Left Eye Near:    Bilateral Near:     Physical Exam  General Appearance:    Alert, cooperative, no distress  HENT:   Normocephalic, left ear erythematous with bulging TM, Right ear normal, nares mucosal edema with congestion, rhinorrhea, oropharynx w/o exudate or erythema  Eyes:    PERRL, conjunctiva/corneas clear, EOM's intact       Lungs:     Clear to auscultation bilaterally, respirations unlabored  Heart:    Regular rate and rhythm  Neurologic:   Awake, alert, oriented x 3. No apparent focal neurological           deficit     UC Treatments / Results  Labs (all labs ordered are listed, but only abnormal results are displayed) Labs Reviewed - No data to display  EKG   Radiology No results found.  Procedures Procedures (including critical care time)  Medications Ordered in UC Medications - No data to display  Initial Impression / Assessment and Plan / UC Course  I have reviewed the triage vital signs and the nursing notes.  Pertinent labs & imaging results that were available during my care of the patient were reviewed by me and considered in  my medical decision making (see chart for details).    Non-recurrent otitis media and Acute Sinusitis Treatment per discharge medication orders  ER precautions given. RTC PRN Final Clinical Impressions(s) / UC Diagnoses   Final diagnoses:  Non-recurrent acute suppurative otitis media of left ear without spontaneous rupture of tympanic membrane  Acute non-recurrent sinusitis, unspecified location     Discharge Instructions      Complete entire course of medication.     ED Prescriptions     Medication Sig Dispense Auth. Provider   amoxicillin (AMOXIL) 875 MG tablet Take 1 tablet (875 mg total) by mouth 2 (two) times daily. 20 tablet Scot Jun, FNP      PDMP not reviewed this encounter.   Scot Jun, Ione 01/12/21 [phone]

## 2021-01-08 NOTE — Discharge Instructions (Addendum)
Complete entire course of medication.

## 2021-01-08 NOTE — ED Triage Notes (Signed)
Pt presents with left ear pain that began Saturday

## 2021-01-17 DIAGNOSIS — I2510 Atherosclerotic heart disease of native coronary artery without angina pectoris: Secondary | ICD-10-CM | POA: Diagnosis not present

## 2021-01-17 DIAGNOSIS — I10 Essential (primary) hypertension: Secondary | ICD-10-CM | POA: Diagnosis not present

## 2021-01-26 ENCOUNTER — Telehealth: Payer: Self-pay | Admitting: Emergency Medicine

## 2021-01-26 NOTE — Telephone Encounter (Signed)
Pt dropped off CT disk for Dr. Lamonte Sakai to review from the [place]. Disk was placed in Dr. Lamonte Sakai box along with a confidential envelope. Pls regard; [phone]

## 2021-01-26 NOTE — Telephone Encounter (Signed)
Routing to [facility] Dr. Agustina Caroli nurse to follow up on

## 2021-01-29 NOTE — Telephone Encounter (Signed)
Disk and letter placed in Dr. Agustina Caroli review folder.

## 2021-02-01 ENCOUNTER — Other Ambulatory Visit: Payer: Self-pay

## 2021-02-01 ENCOUNTER — Ambulatory Visit (INDEPENDENT_AMBULATORY_CARE_PROVIDER_SITE_OTHER): Payer: No Typology Code available for payment source | Admitting: Emergency Medicine

## 2021-02-01 ENCOUNTER — Encounter: Payer: Self-pay | Admitting: Emergency Medicine

## 2021-02-01 DIAGNOSIS — R9389 Abnormal findings on diagnostic imaging of other specified body structures: Secondary | ICD-10-CM

## 2021-02-01 DIAGNOSIS — G4730 Sleep apnea, unspecified: Secondary | ICD-10-CM | POA: Diagnosis not present

## 2021-02-01 DIAGNOSIS — Z87891 Personal history of nicotine dependence: Secondary | ICD-10-CM | POA: Diagnosis not present

## 2021-02-01 NOTE — Addendum Note (Signed)
Addended by: Gavin Potters R on: 02/01/2021 11:52 AM   Modules accepted: Orders

## 2021-02-01 NOTE — Patient Instructions (Signed)
We reviewed the CT scan of the chest from August today.  There is still some evidence for some nodular disease that may be consistent with chronic smoldering infection. Agree with repeating her CT scan of the chest in November at the [facility] as planned.  We will try to get the order adjusted to allow [place] to perform a standard CT scan as opposed to just a lung cancer screening CT. Follow Dr. Lamonte Sakai in November after the CT scan so we can review, decide whether any other procedures will be helpful to determine whether infection is present. Continue your CPAP every night. We would not start any scheduled inhaler medication at this time

## 2021-02-01 NOTE — Assessment & Plan Note (Signed)
Continue CPAP as ordered 

## 2021-02-01 NOTE — Progress Notes (Signed)
Subjective:    Patient ID: Gary Carroll, male    DOB: 1946/01/15, 75 y.o.   MRN: [id number]  HPI 75 year old gentleman with a history of tobacco use (47 pack years), GERD with HH, OSA on CPAP - cleans it every few months, osteoarthritis, atrial fibrillation, HTN, aortic stenosis, seasonal asthma, not on BD.   He reports that he has felt well. 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 Feels that he breathes well, he swims a lot. He has some cough, paroxysmal - often assoc w sneezing. Sometimes assoc w meals and reflux - not every day.  Non-productive. He tires easily.   He participates in the lung cancer screening program through the [place].  He had some micronodular disease noted on a low-dose CT 05/2020 that prompted a round of antibiotics.  His most recent lung cancer screening CT was in May as below.   LDCT 09/19/2020, report available for review.  I do not have the images.  Small foci.  Bronchovascular nodular consolidation in left upper lobe have decreased in size compared with January 2022.  There was note made of a few new small focal nodular areas in the left upper lobe up to 5 mm in size.  A repeat scan was recommended for 3 months (August 2022).   ROV 02/01/21 --follow-up visit 75 year old man with history of tobacco use, no real documented COPD although he is at risk for this.  He does have a history of OSA on CPAP, GERD, atrial fibrillation, hypertension, aortic stenosis, seasonal allergies.  He participates in the lung cancer screening program through the [place].  We repeated his CT chest at the [facility] 12/2020 to follow waxing and waning nodular foci, up to 5 mm in the left upper lobe. He has seen and reviewed with Dr Mateo Flow at the [facility]. He has a repeat set for November.   CT chest 12/20/20 from the [place], reviewed by me, some stable scattered old granulomatous disease with some calcification, some waxing and waning nodular consolidation in the left upper lobe, slightly increased in size but with some shifting  distribution that is suggestive of possible atypical mycobacterial infection.   Review of Systems As per HPI  Past Medical History:  Diagnosis Date   Allergic rhinitis    Arthritis    Asthma    seasonal per pt   Bilateral knee pain    OA   Diverticulosis 02/2012   seen on colonoscopy   Erectile dysfunction    GERD (gastroesophageal reflux disease)    Heart murmur    Hiatal hernia    HLD (hyperlipidemia)    HTN (hypertension)    Hydronephrosis determined by ultrasound 08/22/2015   Overactive bladder    Prediabetes    Ringing of ears    Sleep apnea    Urinary frequency    Urinary urgency    Wears glasses      Family History  Problem Relation Age of Onset   Uterine cancer Mother    Cancer Mother        uterine   Coronary artery disease Father    Coronary artery disease Paternal Grandfather    Heart disease Paternal Grandfather    Colon cancer Neg Hx    Esophageal cancer Neg Hx    Stomach cancer Neg Hx    Rectal cancer Neg Hx    Liver disease Neg Hx      Social History   Socioeconomic History   Marital status: Divorced    Spouse name: Not on  file   Number of children: 0   Years of education: Not on file   Highest education level: Not on file  Occupational History   Occupation: retired Land)    Employer: RETIRED  Tobacco Use   Smoking status: Former    Types: Cigarettes    Quit date: 01/18/2010    Years since quitting: 11.0   Smokeless tobacco: Never   Tobacco comments:    Smoked for 25 years- quit September 2011.  Passive exposure from wife  Substance and Sexual Activity   Alcohol use: Not Currently    Alcohol/week: 1.0 - 2.0 standard drink    Types: 1 - 2 Glasses of wine per week    Comment: Stopped drinking 1-2 glasses of wine daily in 2017    Drug use: No   Sexual activity: Not Currently  Other Topics Concern   Not on file  Social History Narrative   Separated from wife (2014) and moved to [place]. Retired- worked in Theatre manager.     Social Determinants of Health   Financial Resource Strain: Not on file  Food Insecurity: Not on file  Transportation Needs: Not on file  Physical Activity: Not on file  Stress: Not on file  Social Connections: Not on file  Intimate Partner Violence: Not on file    No family hx lung CA  Allergies  Allergen Reactions   Diovan [Valsartan] Cough   Tagamet [Cimetidine] Other (See Comments)    irrittability     Outpatient Medications Prior to Visit  Medication Sig Dispense Refill   amoxicillin (AMOXIL) 875 MG tablet Take 1 tablet (875 mg total) by mouth 2 (two) times daily. 20 tablet 0   apixaban (ELIQUIS) 5 MG TABS tablet Take 1 tablet (5 mg total) by mouth 2 (two) times daily. 60 tablet 2   Calcium Carb-Cholecalciferol (CALCIUM 500 +D PO) Take by mouth.     ciprofloxacin-dexamethasone (CIPRODEX) OTIC suspension Place 4 drops into both ears 2 (two) times daily. 7.5 mL 0   diphenhydrAMINE (BENADRYL) 25 MG tablet Take 50 mg by mouth at bedtime as needed for sleep.     furosemide (LASIX) 40 MG tablet Take 40 mg by mouth daily.     loratadine (CLARITIN) 10 MG tablet Take 10 mg by mouth daily as needed.     MAGNESIUM PO Take by mouth.     Melatonin 5 MG TABS Take 10 mg by mouth at bedtime.      metoprolol tartrate (LOPRESSOR) 25 MG tablet Take 1 tablet (25 mg total) by mouth 2 (two) times daily. (Patient taking differently: Take 25 mg by mouth 2 (two) times daily. Two Tablets in the morning and one in the Evening.) 60 tablet 2   Multiple Vitamins-Minerals (MULTIVITAL) tablet Take 1 tablet by mouth daily.     Omega-3 Fatty Acids (FISH OIL) 1000 MG CAPS Take 4 capsules by mouth daily.      oxybutynin (DITROPAN) 5 MG tablet Take 5 mg by mouth daily.     potassium chloride (K-DUR) 10 MEQ tablet Take 1 tablet (10 mEq total) by mouth daily. 30 tablet 0   pravastatin (PRAVACHOL) 40 MG tablet      rosuvastatin (CRESTOR) 20 MG tablet Take 20 mg by mouth daily.     vitamin C (ASCORBIC ACID) 500  MG tablet Take 500 mg by mouth daily.     fluticasone (FLONASE) 50 MCG/ACT nasal spray Place 1 spray into both nostrils daily for 14 days. 16 g 0   No facility-administered  medications prior to visit.   Was in the [employer], flight deck, maintenance.  Has lived in [place], [place] No inhaled exposures.        Objective:   Physical Exam  Vitals:   02/01/21 1111  BP: 124/74  Pulse: (!) 55  Temp: 98.5 F (36.9 C)  TempSrc: Oral  SpO2: 97%  Weight: 272 lb (123.4 kg)  Height: '5\' 11"'$  (1.803 m)   Gen: Pleasant, obese man, in no distress,  normal affect  ENT: No lesions,  mouth clear,  oropharynx clear, no postnasal drip  Neck: No JVD, no stridor  Lungs: No use of accessory muscles, no crackles or wheezing on normal respiration, no wheeze on forced expiration  Cardiovascular: RRR, harsh holosystolic murmur with intact S2.  2+ bilateral lower extremity edema with compression hose in place  Musculoskeletal: No deformities, no cyanosis or clubbing  Neuro: alert, awake, non focal  Skin: Warm, no lesions or rash      Assessment & Plan:  Abnormal CT of the chest With marker nodular disease especially in the left upper lobe that is slightly more prominent but is also changed some in location/position, suggestive of opportunistic infection or mycobacterial disease.  Malignancy is still possible and this will need to be followed.  He is asymptomatic, so no urgent indication for bronchoscopy to obtain culture data.  I think we should repeat his CT in November as has been initially planned by the [place].  I like to change the CT to a super D scan so that I can use it for navigational bronchoscopy if indicated to either work-up malignancy or mycobacterial disease.  Sleep apnea Continue CPAP as ordered  History of tobacco use Not currently on bronchodilator therapy.  May be beneficial at some point going forward.  We can consider PFT, BD in the future  Baltazar Apo, MD, PhD 02/01/2021,  11:29 AM [facility] [phone] or if no answer before 7:00PM call [phone] For any issues after 7:00PM please call eLink [phone]

## 2021-02-01 NOTE — Assessment & Plan Note (Signed)
With marker nodular disease especially in the left upper lobe that is slightly more prominent but is also changed some in location/position, suggestive of opportunistic infection or mycobacterial disease.  Malignancy is still possible and this will need to be followed.  He is asymptomatic, so no urgent indication for bronchoscopy to obtain culture data.  I think we should repeat his CT in November as has been initially planned by the [place].  I like to change the CT to a super D scan so that I can use it for navigational bronchoscopy if indicated to either work-up malignancy or mycobacterial disease.

## 2021-02-01 NOTE — Assessment & Plan Note (Signed)
Not currently on bronchodilator therapy.  May be beneficial at some point going forward.  We can consider PFT, BD in the future

## 2021-02-02 ENCOUNTER — Encounter (HOSPITAL_COMMUNITY): Payer: Self-pay | Admitting: Emergency Medicine

## 2021-02-02 ENCOUNTER — Inpatient Hospital Stay (HOSPITAL_COMMUNITY)
Admission: EM | Admit: 2021-02-02 | Discharge: 2021-02-16 | DRG: 698 | Disposition: A | Discharge status: Home Health | Payer: No Typology Code available for payment source | Attending: Family Medicine | Admitting: Family Medicine

## 2021-02-02 ENCOUNTER — Emergency Department (HOSPITAL_COMMUNITY): Payer: No Typology Code available for payment source

## 2021-02-02 ENCOUNTER — Other Ambulatory Visit: Payer: Self-pay

## 2021-02-02 ENCOUNTER — Inpatient Hospital Stay (HOSPITAL_COMMUNITY): Payer: No Typology Code available for payment source

## 2021-02-02 ENCOUNTER — Ambulatory Visit
Admission: EM | Admit: 2021-02-02 | Discharge: 2021-02-02 | Disposition: A | Discharge status: Home / Self Care | Payer: No Typology Code available for payment source | Attending: Emergency Medicine | Admitting: Emergency Medicine

## 2021-02-02 ENCOUNTER — Encounter: Payer: Self-pay | Admitting: Emergency Medicine

## 2021-02-02 DIAGNOSIS — L89309 Pressure ulcer of unspecified buttock, unspecified stage: Secondary | ICD-10-CM | POA: Insufficient documentation

## 2021-02-02 DIAGNOSIS — I5031 Acute diastolic (congestive) heart failure: Secondary | ICD-10-CM | POA: Diagnosis present

## 2021-02-02 DIAGNOSIS — B3749 Other urogenital candidiasis: Secondary | ICD-10-CM

## 2021-02-02 DIAGNOSIS — L8990 Pressure ulcer of unspecified site, unspecified stage: Secondary | ICD-10-CM | POA: Insufficient documentation

## 2021-02-02 DIAGNOSIS — Z87891 Personal history of nicotine dependence: Secondary | ICD-10-CM

## 2021-02-02 DIAGNOSIS — J9602 Acute respiratory failure with hypercapnia: Secondary | ICD-10-CM | POA: Diagnosis present

## 2021-02-02 DIAGNOSIS — R410 Disorientation, unspecified: Secondary | ICD-10-CM | POA: Diagnosis not present

## 2021-02-02 DIAGNOSIS — L03116 Cellulitis of left lower limb: Secondary | ICD-10-CM | POA: Diagnosis present

## 2021-02-02 DIAGNOSIS — E876 Hypokalemia: Secondary | ICD-10-CM | POA: Diagnosis present

## 2021-02-02 DIAGNOSIS — E782 Mixed hyperlipidemia: Secondary | ICD-10-CM | POA: Diagnosis present

## 2021-02-02 DIAGNOSIS — I739 Peripheral vascular disease, unspecified: Secondary | ICD-10-CM | POA: Diagnosis present

## 2021-02-02 DIAGNOSIS — E669 Obesity, unspecified: Secondary | ICD-10-CM | POA: Diagnosis present

## 2021-02-02 DIAGNOSIS — N179 Acute kidney failure, unspecified: Secondary | ICD-10-CM | POA: Diagnosis not present

## 2021-02-02 DIAGNOSIS — G4730 Sleep apnea, unspecified: Secondary | ICD-10-CM | POA: Diagnosis not present

## 2021-02-02 DIAGNOSIS — L0390 Cellulitis, unspecified: Secondary | ICD-10-CM | POA: Diagnosis not present

## 2021-02-02 DIAGNOSIS — N390 Urinary tract infection, site not specified: Secondary | ICD-10-CM | POA: Diagnosis present

## 2021-02-02 DIAGNOSIS — Z8619 Personal history of other infectious and parasitic diseases: Secondary | ICD-10-CM

## 2021-02-02 DIAGNOSIS — R911 Solitary pulmonary nodule: Secondary | ICD-10-CM | POA: Diagnosis present

## 2021-02-02 DIAGNOSIS — I248 Other forms of acute ischemic heart disease: Secondary | ICD-10-CM | POA: Diagnosis present

## 2021-02-02 DIAGNOSIS — T83518A Infection and inflammatory reaction due to other urinary catheter, initial encounter: Principal | ICD-10-CM | POA: Diagnosis present

## 2021-02-02 DIAGNOSIS — Z79899 Other long term (current) drug therapy: Secondary | ICD-10-CM

## 2021-02-02 DIAGNOSIS — R0902 Hypoxemia: Secondary | ICD-10-CM | POA: Diagnosis not present

## 2021-02-02 DIAGNOSIS — R0602 Shortness of breath: Secondary | ICD-10-CM

## 2021-02-02 DIAGNOSIS — G4733 Obstructive sleep apnea (adult) (pediatric): Secondary | ICD-10-CM | POA: Diagnosis present

## 2021-02-02 DIAGNOSIS — E872 Acidosis: Secondary | ICD-10-CM | POA: Diagnosis present

## 2021-02-02 DIAGNOSIS — R6520 Severe sepsis without septic shock: Secondary | ICD-10-CM | POA: Diagnosis not present

## 2021-02-02 DIAGNOSIS — E785 Hyperlipidemia, unspecified: Secondary | ICD-10-CM | POA: Diagnosis present

## 2021-02-02 DIAGNOSIS — Z743 Need for continuous supervision: Secondary | ICD-10-CM | POA: Diagnosis not present

## 2021-02-02 DIAGNOSIS — I4891 Unspecified atrial fibrillation: Secondary | ICD-10-CM | POA: Diagnosis not present

## 2021-02-02 DIAGNOSIS — G9341 Metabolic encephalopathy: Secondary | ICD-10-CM | POA: Diagnosis present

## 2021-02-02 DIAGNOSIS — I10 Essential (primary) hypertension: Secondary | ICD-10-CM | POA: Diagnosis present

## 2021-02-02 DIAGNOSIS — Y846 Urinary catheterization as the cause of abnormal reaction of the patient, or of later complication, without mention of misadventure at the time of the procedure: Secondary | ICD-10-CM | POA: Diagnosis present

## 2021-02-02 DIAGNOSIS — I480 Paroxysmal atrial fibrillation: Secondary | ICD-10-CM | POA: Diagnosis not present

## 2021-02-02 DIAGNOSIS — J9601 Acute respiratory failure with hypoxia: Secondary | ICD-10-CM

## 2021-02-02 DIAGNOSIS — A419 Sepsis, unspecified organism: Secondary | ICD-10-CM | POA: Diagnosis present

## 2021-02-02 DIAGNOSIS — N138 Other obstructive and reflux uropathy: Secondary | ICD-10-CM | POA: Diagnosis present

## 2021-02-02 DIAGNOSIS — N170 Acute kidney failure with tubular necrosis: Secondary | ICD-10-CM | POA: Diagnosis present

## 2021-02-02 DIAGNOSIS — L8942 Pressure ulcer of contiguous site of back, buttock and hip, stage 2: Secondary | ICD-10-CM | POA: Diagnosis not present

## 2021-02-02 DIAGNOSIS — J810 Acute pulmonary edema: Secondary | ICD-10-CM | POA: Diagnosis present

## 2021-02-02 DIAGNOSIS — Z1152 Encounter for screening for COVID-19: Secondary | ICD-10-CM

## 2021-02-02 DIAGNOSIS — Z8249 Family history of ischemic heart disease and other diseases of the circulatory system: Secondary | ICD-10-CM

## 2021-02-02 DIAGNOSIS — R404 Transient alteration of awareness: Secondary | ICD-10-CM | POA: Diagnosis not present

## 2021-02-02 DIAGNOSIS — R0689 Other abnormalities of breathing: Secondary | ICD-10-CM | POA: Diagnosis not present

## 2021-02-02 DIAGNOSIS — I110 Hypertensive heart disease with heart failure: Secondary | ICD-10-CM | POA: Diagnosis present

## 2021-02-02 DIAGNOSIS — Z6837 Body mass index (BMI) 37.0-37.9, adult: Secondary | ICD-10-CM | POA: Diagnosis not present

## 2021-02-02 DIAGNOSIS — J302 Other seasonal allergic rhinitis: Secondary | ICD-10-CM | POA: Diagnosis present

## 2021-02-02 DIAGNOSIS — Z888 Allergy status to other drugs, medicaments and biological substances status: Secondary | ICD-10-CM

## 2021-02-02 DIAGNOSIS — N401 Enlarged prostate with lower urinary tract symptoms: Secondary | ICD-10-CM | POA: Diagnosis not present

## 2021-02-02 DIAGNOSIS — Z9049 Acquired absence of other specified parts of digestive tract: Secondary | ICD-10-CM

## 2021-02-02 DIAGNOSIS — K219 Gastro-esophageal reflux disease without esophagitis: Secondary | ICD-10-CM | POA: Diagnosis present

## 2021-02-02 DIAGNOSIS — B961 Klebsiella pneumoniae [K. pneumoniae] as the cause of diseases classified elsewhere: Secondary | ICD-10-CM | POA: Diagnosis present

## 2021-02-02 DIAGNOSIS — Z96653 Presence of artificial knee joint, bilateral: Secondary | ICD-10-CM | POA: Diagnosis present

## 2021-02-02 DIAGNOSIS — Z20822 Contact with and (suspected) exposure to covid-19: Secondary | ICD-10-CM | POA: Diagnosis present

## 2021-02-02 DIAGNOSIS — R6521 Severe sepsis with septic shock: Secondary | ICD-10-CM | POA: Diagnosis present

## 2021-02-02 DIAGNOSIS — E7439 Other disorders of intestinal carbohydrate absorption: Secondary | ICD-10-CM | POA: Diagnosis present

## 2021-02-02 DIAGNOSIS — N319 Neuromuscular dysfunction of bladder, unspecified: Secondary | ICD-10-CM | POA: Diagnosis present

## 2021-02-02 DIAGNOSIS — R590 Localized enlarged lymph nodes: Secondary | ICD-10-CM | POA: Diagnosis present

## 2021-02-02 DIAGNOSIS — I872 Venous insufficiency (chronic) (peripheral): Secondary | ICD-10-CM | POA: Diagnosis present

## 2021-02-02 DIAGNOSIS — L89152 Pressure ulcer of sacral region, stage 2: Secondary | ICD-10-CM | POA: Diagnosis present

## 2021-02-02 DIAGNOSIS — I2510 Atherosclerotic heart disease of native coronary artery without angina pectoris: Secondary | ICD-10-CM | POA: Diagnosis present

## 2021-02-02 DIAGNOSIS — N529 Male erectile dysfunction, unspecified: Secondary | ICD-10-CM | POA: Diagnosis present

## 2021-02-02 DIAGNOSIS — N400 Enlarged prostate without lower urinary tract symptoms: Secondary | ICD-10-CM | POA: Diagnosis present

## 2021-02-02 DIAGNOSIS — L03119 Cellulitis of unspecified part of limb: Secondary | ICD-10-CM | POA: Diagnosis not present

## 2021-02-02 DIAGNOSIS — J9600 Acute respiratory failure, unspecified whether with hypoxia or hypercapnia: Secondary | ICD-10-CM

## 2021-02-02 DIAGNOSIS — Z9181 History of falling: Secondary | ICD-10-CM

## 2021-02-02 DIAGNOSIS — R3914 Feeling of incomplete bladder emptying: Secondary | ICD-10-CM | POA: Diagnosis not present

## 2021-02-02 DIAGNOSIS — Z7901 Long term (current) use of anticoagulants: Secondary | ICD-10-CM

## 2021-02-02 DIAGNOSIS — M1990 Unspecified osteoarthritis, unspecified site: Secondary | ICD-10-CM | POA: Diagnosis present

## 2021-02-02 LAB — LACTIC ACID, PLASMA
Lactic Acid, Venous: 3 mmol/L (ref 0.5–1.9)
Lactic Acid, Venous: 2.6 mmol/L (ref 0.5–1.9)

## 2021-02-02 LAB — RESP PANEL BY RT-PCR (FLU A&B, COVID) ARPGX2
Influenza A by PCR: NEGATIVE
Influenza B by PCR: NEGATIVE
SARS Coronavirus 2 by RT PCR: NEGATIVE

## 2021-02-02 LAB — BASIC METABOLIC PANEL
Anion gap: 8 (ref 5–15)
BUN: 22 mg/dL (ref 8–23)
CO2: 23 mmol/L (ref 22–32)
Calcium: 9 mg/dL (ref 8.9–10.3)
Chloride: 105 mmol/L (ref 98–111)
Creatinine, Ser: 1.09 mg/dL (ref 0.61–1.24)
GFR, Estimated: >60 mL/min (ref >60)
Glucose, Bld: 135 mg/dL — ABNORMAL HIGH (ref 70–99)
Potassium: 4.2 mmol/L (ref 3.5–5.1)
Sodium: 136 mmol/L (ref 135–145)

## 2021-02-02 LAB — URINALYSIS, ROUTINE W REFLEX MICROSCOPIC
Bilirubin Urine: NEGATIVE
Glucose, UA: NEGATIVE mg/dL
Hgb urine dipstick: NEGATIVE
Ketones, ur: NEGATIVE mg/dL
Nitrite: POSITIVE — AB
Protein, ur: NEGATIVE mg/dL
Specific Gravity, Urine: 1.017 (ref 1.005–1.030)
WBC, UA: >50 WBC/hpf — ABNORMAL HIGH (ref 0–5)
pH: 7 (ref 5.0–8.0)

## 2021-02-02 LAB — CBC
HCT: 42.3 % (ref 39.0–52.0)
Hemoglobin: 13.8 g/dL (ref 13.0–17.0)
MCH: 29.3 pg (ref 26.0–34.0)
MCHC: 32.6 g/dL (ref 30.0–36.0)
MCV: 89.8 fL (ref 80.0–100.0)
Platelets: 162 10*3/uL (ref 150–400)
RBC: 4.71 MIL/uL (ref 4.22–5.81)
RDW: 14.6 % (ref 11.5–15.5)
WBC: 16.7 10*3/uL — ABNORMAL HIGH (ref 4.0–10.5)
nRBC: 0 % (ref 0.0–0.2)

## 2021-02-02 LAB — CBG MONITORING, ED: Glucose-Capillary: 127 mg/dL — ABNORMAL HIGH (ref 70–99)

## 2021-02-02 MED ORDER — FUROSEMIDE 10 MG/ML IJ SOLN
80.0000 mg | Freq: Once | INTRAMUSCULAR | Status: AC
  Administered 2021-02-02: 80 mg via INTRAVENOUS
  Filled 2021-02-02: qty 8

## 2021-02-02 MED ORDER — ALBUTEROL SULFATE (2.5 MG/3ML) 0.083% IN NEBU
2.5000 mg | INHALATION_SOLUTION | Freq: Once | RESPIRATORY_TRACT | Status: AC
  Administered 2021-02-02: 2.5 mg via RESPIRATORY_TRACT
  Filled 2021-02-02: qty 3

## 2021-02-02 MED ORDER — IOHEXOL 350 MG/ML SOLN
75.0000 mL | Freq: Once | INTRAVENOUS | Status: AC | PRN
  Administered 2021-02-02: 75 mL via INTRAVENOUS

## 2021-02-02 MED ORDER — LACTATED RINGERS IV SOLN: INTRAVENOUS | Status: AC

## 2021-02-02 MED ORDER — NITROGLYCERIN IN D5W 200-5 MCG/ML-% IV SOLN: 5.0000 ug/min | INTRAVENOUS | Status: DC

## 2021-02-02 MED ORDER — VANCOMYCIN HCL 1750 MG/350ML IV SOLN: 1750.0000 mg | INTRAVENOUS | Status: DC

## 2021-02-02 MED ORDER — MIDAZOLAM HCL 2 MG/2ML IJ SOLN
INTRAMUSCULAR | Status: AC
  Filled 2021-02-02: qty 2

## 2021-02-02 MED ORDER — CEFTRIAXONE SODIUM 1 G IJ SOLR
1.0000 g | Freq: Once | INTRAMUSCULAR | Status: AC
  Administered 2021-02-02: 1 g via INTRAMUSCULAR

## 2021-02-02 MED ORDER — PROPOFOL 1000 MG/100ML IV EMUL
5.0000 ug/kg/min | INTRAVENOUS | Status: DC
  Administered 2021-02-02: 5 ug/kg/min via INTRAVENOUS
  Administered 2021-02-03: 15 ug/kg/min via INTRAVENOUS
  Filled 2021-02-02 (×2): qty 100

## 2021-02-02 MED ORDER — ACETAMINOPHEN 325 MG PO TABS
650.0000 mg | ORAL_TABLET | Freq: Once | ORAL | Status: AC | PRN
  Administered 2021-02-02: 650 mg via ORAL
  Filled 2021-02-02: qty 2

## 2021-02-02 MED ORDER — FENTANYL CITRATE PF 50 MCG/ML IJ SOSY
PREFILLED_SYRINGE | INTRAMUSCULAR | Status: AC
  Filled 2021-02-02: qty 2

## 2021-02-02 MED ORDER — LACTATED RINGERS IV BOLUS (SEPSIS)
1000.0000 mL | Freq: Once | INTRAVENOUS | Status: AC
  Administered 2021-02-02: 1000 mL via INTRAVENOUS

## 2021-02-02 MED ORDER — SODIUM CHLORIDE 0.9 % IV SOLN
2.0000 g | Freq: Once | INTRAVENOUS | Status: AC
  Administered 2021-02-02: 2 g via INTRAVENOUS
  Filled 2021-02-02: qty 2

## 2021-02-02 MED ORDER — IOHEXOL 350 MG/ML SOLN
50.0000 mL | Freq: Once | INTRAVENOUS | Status: AC | PRN
  Administered 2021-02-02: 50 mL via INTRAVENOUS

## 2021-02-02 MED ORDER — SODIUM CHLORIDE 0.9 % IV SOLN
2.0000 g | Freq: Three times a day (TID) | INTRAVENOUS | Status: DC
  Administered 2021-02-03: 2 g via INTRAVENOUS
  Filled 2021-02-02 (×2): qty 2

## 2021-02-02 MED ORDER — METRONIDAZOLE 500 MG/100ML IV SOLN
500.0000 mg | Freq: Once | INTRAVENOUS | Status: AC
  Administered 2021-02-02: 500 mg via INTRAVENOUS
  Filled 2021-02-02: qty 100

## 2021-02-02 MED ORDER — ROCURONIUM BROMIDE 10 MG/ML (PF) SYRINGE
PREFILLED_SYRINGE | INTRAVENOUS | Status: AC
  Filled 2021-02-02: qty 10

## 2021-02-02 MED ORDER — KETAMINE HCL 50 MG/5ML IJ SOSY
PREFILLED_SYRINGE | INTRAMUSCULAR | Status: AC
  Filled 2021-02-02: qty 5

## 2021-02-02 MED ORDER — SUCCINYLCHOLINE CHLORIDE 200 MG/10ML IV SOSY
PREFILLED_SYRINGE | INTRAVENOUS | Status: AC
  Administered 2021-02-02: 100 mg
  Filled 2021-02-02: qty 10

## 2021-02-02 MED ORDER — DOXYCYCLINE HYCLATE 100 MG PO CAPS: 100.0000 mg | ORAL_CAPSULE | Freq: Two times a day (BID) | ORAL | 0 refills | Status: DC

## 2021-02-02 MED ORDER — VANCOMYCIN HCL IN DEXTROSE 1-5 GM/200ML-% IV SOLN: 1000.0000 mg | Freq: Once | INTRAVENOUS | Status: DC

## 2021-02-02 MED ORDER — LACTATED RINGERS IV BOLUS (SEPSIS)
400.0000 mL | Freq: Once | INTRAVENOUS | Status: AC
  Administered 2021-02-02: 400 mL via INTRAVENOUS

## 2021-02-02 MED ORDER — ETOMIDATE 2 MG/ML IV SOLN
INTRAVENOUS | Status: AC
  Administered 2021-02-02: 20 mg
  Filled 2021-02-02: qty 20

## 2021-02-02 MED ORDER — VANCOMYCIN HCL 2000 MG/400ML IV SOLN
2000.0000 mg | INTRAVENOUS | Status: AC
  Administered 2021-02-02: 2000 mg via INTRAVENOUS
  Filled 2021-02-02: qty 400

## 2021-02-02 MED ORDER — ACETAMINOPHEN 500 MG PO TABS
1000.0000 mg | ORAL_TABLET | Freq: Once | ORAL | Status: DC
  Filled 2021-02-02: qty 2

## 2021-02-02 NOTE — ED Triage Notes (Addendum)
Pt presents today with c/o of fever and body aches x 2 days. He also c/o swelling to BLE. +SOB with ambulation

## 2021-02-02 NOTE — ED Notes (Signed)
2327 EDP at bedside.  2329 etomidate 20 adm LAC 2330 Succs 100 adm LAC 2332 intubation with positive color change 7.5 and 23 at lip  Pt stable at present.

## 2021-02-02 NOTE — Progress Notes (Signed)
Pharmacy Antibiotic Note  Gary Carroll is a 75 y.o. male admitted on 02/02/2021 with sepsis.  Pharmacy has been consulted for Vanco, Cefepime dosing.  CC/HPI: Sepsis of unknown source. Pt became weak with altered mental status while at a local pharmacy.Pt was seen at [facility] today with c/o of fever and body aches x 2 days. He also c/o swelling to BLE. +SOB. Pt received a shot of Rocephin and give Rx for Doxy.  PMH: allergic rhinitis, arthritis, asthma, OA, diverticulosis, ED, GERD, HH, HLD, HTN, OAB, pre--DM, tinnitis, OSA, urnary freq and urgency  Significant events: hx of staph infection to bilateral LE and was hospitalized for 9 days. does admit to swimming at the [facility].   ID: Sepsis of unknown origin, possibly legs. Tmax 102.1. WBC 16.7. LA 3. CXR ok on admit. Scr 1.09  Vanco 9/16>> Cefepime 9/16>>  9/16: COVID/Fl   Plan: Cefepime 2g IV q8hr Vanco 2g IV x 1 load Vancomycin 1750 mg IV Q 24 hrs. Goal AUC 400-550. Expected AUC: 512 SCr used: 1.09   Height: '5\' 11"'$  (180.3 cm) Weight: 121.6 kg (268 lb) IBW/kg (Calculated) : 75.3  Temp (24hrs), Avg:101.3 F (38.5 C), Min:100 F (37.8 C), Max:102.1 F (38.9 C)  Recent Labs  Lab 02/02/21 1713 02/02/21 1908  WBC 16.7*  --   CREATININE 1.09  --   LATICACIDVEN  --  3.0*    Estimated Creatinine Clearance: 77.7 mL/min (by C-G formula based on SCr of 1.09 mg/dL).    Allergies  Allergen Reactions   Diovan [Valsartan] Cough   Tagamet [Cimetidine] Other (See Comments)    irrittability    Gary Carroll, PharmD, BCPS Clinical Staff Pharmacist [email or web address]  Wayland Salinas 02/02/2021 8:46 PM

## 2021-02-02 NOTE — ED Provider Notes (Signed)
[place]   [id number] 02/02/21 Arrival Time: [id number]   CC: Leg infection  SUBJECTIVE:  Gary Carroll is a 75 y.o. male who presents with possible leg infection.  Reports fatigue, body aches, legs swelling and redness x 2 days.  Reports past hx of staph infection to bilateral LE and was hospitalized for 9 days.  Has been trying to keep legs clean.  Denies known precipitating event, but does admit to swimming at the [facility] [place].  Denies vomiting, chest pain, SOB at rest.    ROS: As per HPI.  All other pertinent ROS negative.     Past Medical History:  Diagnosis Date   Allergic rhinitis    Arthritis    Asthma    seasonal per pt   Bilateral knee pain    OA   Diverticulosis 02/2012   seen on colonoscopy   Erectile dysfunction    GERD (gastroesophageal reflux disease)    Heart murmur    Hiatal hernia    HLD (hyperlipidemia)    HTN (hypertension)    Hydronephrosis determined by ultrasound 08/22/2015   Overactive bladder    Prediabetes    Ringing of ears    Sleep apnea    Urinary frequency    Urinary urgency    Wears glasses    Past Surgical History:  Procedure Laterality Date   CHOLECYSTECTOMY     COLONOSCOPY  02/2012   Dr. 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   Current Outpatient Medications on File Prior to Encounter  Medication Sig Dispense Refill   furosemide (LASIX) 40 MG tablet Take 40 mg by mouth daily.      apixaban (ELIQUIS) 5 MG TABS tablet Take 1 tablet (5 mg total) by mouth 2 (two) times daily. 60 tablet 2   Calcium Carb-Cholecalciferol (CALCIUM 500 +D PO) Take by mouth.     ciprofloxacin-dexamethasone (CIPRODEX) OTIC suspension Place 4 drops into both ears 2 (two) times daily. 7.5 mL 0   diphenhydrAMINE (BENADRYL) 25 MG tablet Take 50 mg by mouth at bedtime as needed for sleep.     fluticasone (FLONASE) 50 MCG/ACT nasal spray Place 1 spray into both nostrils daily for 14 days. 16 g 0   loratadine (CLARITIN) 10 MG tablet Take 10 mg by mouth daily as needed.     MAGNESIUM PO Take by mouth.     Melatonin 5 MG TABS Take 10 mg by mouth at bedtime.      metoprolol tartrate (LOPRESSOR) 25 MG tablet Take 1 tablet (25 mg total) by mouth 2 (two) times daily. (Patient taking differently: Take 25 mg by mouth 2 (two) times daily. Two Tablets in the morning and one in the Evening.) 60 tablet 2   Multiple Vitamins-Minerals (MULTIVITAL) tablet Take 1 tablet by mouth daily.     Omega-3 Fatty Acids (FISH OIL) 1000 MG CAPS Take 4 capsules by mouth daily.      oxybutynin (  DITROPAN) 5 MG tablet Take 5 mg by mouth daily.     potassium chloride (K-DUR) 10 MEQ tablet Take 1 tablet (10 mEq total) by mouth daily. 30 tablet 0   pravastatin (PRAVACHOL) 40 MG tablet      rosuvastatin (CRESTOR) 20 MG tablet Take 20 mg by mouth daily.     vitamin C (ASCORBIC ACID) 500 MG tablet Take 500 mg by mouth daily.     Social History   Socioeconomic History   Marital status: Divorced    Spouse name: Not on file   Number of children: 0   Years of education: Not on file   Highest education level: Not on file  Occupational History   Occupation: retired [employer]    Employer: RETIRED  Tobacco Use   Smoking status: Former    Types: Cigarettes    Quit date: 01/18/2010    Years since quitting: 11.0   Smokeless tobacco: Never   Tobacco comments:    Smoked for 25 years- quit September 2011.  Passive exposure from wife   Substance and Sexual Activity   Alcohol use: Not Currently    Alcohol/week: 1.0 - 2.0 standard drink    Types: 1 - 2 Glasses of wine per week    Comment: Stopped drinking 1-2 glasses of wine daily in 2017    Drug use: No   Sexual activity: Not Currently  Other Topics Concern   Not on file  Social History Narrative   Separated from wife (2014) and moved to [place]. Retired- worked in Theatre manager.    Social Determinants of Health   Financial Resource Strain: Not on file  Food Insecurity: Not on file  Transportation Needs: Not on file  Physical Activity: Not on file  Stress: Not on file  Social Connections: Not on file  Intimate Partner Violence: Not on file   Family History  Problem Relation Age of Onset   Uterine cancer Mother    Cancer Mother        uterine   Coronary artery disease Father    Coronary artery disease Paternal Grandfather    Heart disease Paternal Grandfather    Colon cancer Neg Hx    Esophageal cancer Neg Hx    Stomach cancer Neg Hx    Rectal cancer Neg Hx    Liver disease Neg Hx     OBJECTIVE:  Vitals:   02/02/21 1405  BP: (!) 176/69  Pulse: 72  Resp: 20  Temp: 100 F (37.8 C)  TempSrc: Oral  SpO2: 94%     General appearance: alert; no distress HENT: ncat; eacs clear; nares patent; oropharynx clear CV: murmur present Lungs: CTAB Skin: bilateral LE with 2+ pitting edema to proximal tib/fib with  overlying erythema/ brawny skin changes (anterior aspects), TTP, no obvious wounds, drainage or bleeding Psychological: alert and cooperative; normal mood and affect  ASSESSMENT & PLAN:  1. Cellulitis of lower leg   2. Encounter for screening for COVID-19     Meds ordered this encounter  Medications   doxycycline (VIBRAMYCIN) 100 MG capsule    Sig: Take 1 capsule (100 mg total) by mouth 2 (two) times daily.    Dispense:  20 capsule    Refill:  0    Order Specific Question:   Supervising Provider    Answer:   Raylene Everts  [id number]   cefTRIAXone (ROCEPHIN) injection 1 g   Rocephin given in office Prescribed doxycycline take as directed and to completion Continue to alternate ibuprofen and  tylenol as needed for pain and fever Follow up with PCP this week for recheck Go to the ED if you have any new or worsening symptoms such as increased pain, redness, swelling, discharge, high fever, night sweats, abdominal pain, symptoms do not improve with interventions over the next 24-48 hours, etc...   Reviewed expectations re: course of current medical issues. Questions answered. Outlined signs and symptoms indicating need for more acute intervention. Patient verbalized understanding. After Visit Summary given.           Lestine Box, PA-C 02/02/21 1439

## 2021-02-02 NOTE — Discharge Instructions (Signed)
Rocephin given in office Prescribed doxycycline take as directed and to completion Continue to alternate ibuprofen and tylenol as needed for pain and fever Follow up with PCP this week for recheck Go to the ED if you have any new or worsening symptoms such as increased pain, redness, swelling, discharge, high fever, night sweats, abdominal pain, symptoms do not improve with interventions over the next 24-48 hours, etc..Marland Kitchen

## 2021-02-02 NOTE — ED Provider Notes (Addendum)
[facility] EMERGENCY DEPARTMENT Provider Note   CSN: [id number] Arrival date & time: 02/02/21  1644     History Chief Complaint  Patient presents with   Weakness    Gary Carroll is a 75 y.o. male.  Patient brought in by EMS.  Patient became weak at a Management consultant.  Seemed a little bit confused.  Was warm to touch.  Arrived here with a temp of 101.8.  Patient was at urgent care today was complaining of fever and body aches x2 days.  No known COVID exposure.  Patient has chronic bilateral lower extremity edema.  Patient has having some exertional shortness of breath.  Patient was given a shot of Rocephin in urgent care.  Patient seen recently by pulmonary medicine.  For abnormal findings on a chest CT that could be neoplastic or could be infectious.  There was some concern raised for tuberculosis.  They were planning a bronc with biopsy at some time in the future.  Patient not started on any antibiotics by pulmonary medicine.  Patient is also followed at the [facility] as well.      Past Medical History:  Diagnosis Date   Allergic rhinitis    Arthritis    Asthma    seasonal per pt   Bilateral knee pain    OA   Diverticulosis 02/2012   seen on colonoscopy   Erectile dysfunction    GERD (gastroesophageal reflux disease)    Heart murmur    Hiatal hernia    HLD (hyperlipidemia)    HTN (hypertension)    Hydronephrosis determined by ultrasound 08/22/2015   Overactive bladder    Prediabetes    Ringing of ears    Sleep apnea    Urinary frequency    Urinary urgency    Wears glasses     Patient Active Problem List   Diagnosis Date Noted   Sepsis ([place]) 02/02/2021   Abnormal CT of the chest 10/23/2020   History of tobacco use 10/23/2020   Chronic knee pain 03/19/2018   Chronic postoperative pain 03/19/2018   Elevated transaminase level    Sleep apnea 10/01/2017   BPH (benign prostatic hyperplasia) 02/04/2017   Urinary tract infection 02/04/2017   Cellulitis of left lower  extremity 02/03/2017   GERD (gastroesophageal reflux disease) 02/03/2017   AF (paroxysmal atrial fibrillation) ([place]) 02/03/2017   Sepsis due to cellulitis ([place]) 02/03/2017   New onset atrial fibrillation ([place]) 08/24/2015   Diuresis excessive 08/22/2015   Bladder outlet obstruction 08/22/2015   Hydronephrosis determined by ultrasound 08/22/2015   AKI (acute kidney injury) ([place]) 08/22/2015   Hypernatremia 08/22/2015   Acute blood loss anemia 08/22/2015   Hematuria 08/22/2015   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 Lorre Nick)   [place] Right 06/19/2015  Procedure: TOTAL KNEE ARTHROPLASTY;  Surgeon: Vickey Huger, MD;  Location: [place];  Service: Orthopedics;  Laterality: Right;       Family History  Problem Relation Age of Onset   Uterine cancer Mother    Cancer Mother        uterine   Coronary artery disease Father    Coronary artery disease Paternal Grandfather    Heart disease Paternal Grandfather    Colon cancer Neg Hx    Esophageal cancer Neg Hx    Stomach cancer Neg Hx    Rectal cancer Neg Hx    Liver disease Neg Hx     Social History   Tobacco Use   Smoking status: Former    Types: Cigarettes    Quit date: 01/18/2010    Years since quitting: 11.0   Smokeless  tobacco: Never   Tobacco comments:    Smoked for 25 years- quit September 2011.  Passive exposure from wife  Vaping Use   Vaping Use: Never used  Substance Use Topics   Alcohol use: Not Currently    Alcohol/week: 1.0 - 2.0 standard drink    Types: 1 - 2 Glasses of wine per week    Comment: Stopped drinking 1-2 glasses of wine daily in 2017    Drug use: No    Home Medications Prior to Admission medications   Medication Sig Start Date End Date Taking? Authorizing Provider  apixaban (ELIQUIS) 5 MG TABS tablet Take 1 tablet (5 mg total) by mouth 2 (two) times daily. 08/24/15  Yes Isaac Bliss, Rayford Halsted, MD  Calcium Carb-Cholecalciferol (CALCIUM 500 +D PO) Take by mouth.   Yes [provider]  diphenhydrAMINE (BENADRYL) 25 MG tablet Take 50 mg by mouth at bedtime as needed for sleep.   Yes [provider]  furosemide (LASIX) 40 MG tablet Take 40 mg by mouth daily. 07/27/19  Yes [provider]  loratadine (CLARITIN) 10 MG tablet Take 10 mg by mouth daily as needed.   Yes [provider]  losartan (COZAAR) 50 MG tablet Take 50 mg by mouth daily.   Yes [provider]  MAGNESIUM PO Take by mouth.   Yes [provider]  Melatonin 5 MG TABS Take 10 mg by mouth at bedtime.    Yes [provider]  metoprolol tartrate (LOPRESSOR) 25 MG tablet Take 1 tablet (25 mg total) by mouth 2 (two) times daily. Patient taking differently: Take 25 mg by mouth 2 (two) times daily. Two Tablets in the morning and one in the Evening. 08/24/15  Yes Isaac Bliss, Rayford Halsted, MD  Multiple Vitamins-Minerals (MULTIVITAL) tablet Take 1 tablet by mouth daily.   Yes [provider]  Omega-3 Fatty Acids (FISH OIL) 1000 MG CAPS Take 4 capsules by mouth daily.    Yes [provider]  oxybutynin (DITROPAN) 5 MG tablet Take 5 mg by mouth daily. 06/01/19  Yes [provider]  potassium chloride (K-DUR) 10 MEQ tablet Take 1 tablet (10 mEq  total) by mouth daily. 10/09/17  Yes Kathie Dike, MD  rosuvastatin (CRESTOR) 20 MG tablet Take 20 mg by mouth daily.   Yes [provider]  terbinafine (LAMISIL) 1 % cream APPLY MODERATE AMOUNT TO AFFECTED AREA TWICE A DAY 12/13/20  Yes [provider]  vitamin C (ASCORBIC ACID) 500 MG tablet Take 500 mg by mouth daily.   Yes [provider]  ciprofloxacin-dexamethasone (CIPRODEX) OTIC suspension Place 4 drops into both ears 2 (two) times daily.  Patient not taking: No sig reported 11/05/19   Emerson Monte, FNP  doxycycline (VIBRAMYCIN) 100 MG capsule Take 1 capsule (100 mg total) by mouth 2 (two) times daily. 02/02/21   Wurst, [place], PA-C  fluticasone (FLONASE) 50 MCG/ACT nasal spray Place 1 spray into both nostrils daily for 14 days. Patient not taking: Reported on 02/02/2021 11/05/19 11/19/19  Emerson Monte, FNP  Potassium Gluconate 2.5 MEQ TABS Take 1 tablet by mouth daily. Patient not taking: No sig reported 07/22/17   [provider]  pravastatin (PRAVACHOL) 40 MG tablet  01/05/19   [provider]    Allergies    Diovan [valsartan] and Tagamet [cimetidine]  Review of Systems   Review of Systems  Constitutional:  Positive for fever. Negative for chills.  HENT:  Negative for ear pain and sore throat.   Eyes:  Negative for pain and visual disturbance.  Respiratory:  Negative for cough and shortness of breath.   Cardiovascular:  Positive for leg swelling. Negative for chest pain and palpitations.  Gastrointestinal:  Negative for abdominal pain and vomiting.  Genitourinary:  Negative for dysuria and hematuria.  Musculoskeletal:  Positive for myalgias. Negative for arthralgias and back pain.  Skin:  Negative for color change and rash.  Neurological:  Negative for seizures and syncope.  Psychiatric/Behavioral:  Positive for confusion.   All other systems reviewed and are negative.  Physical Exam Updated Vital Signs BP 140/74   Pulse  95   Temp (!) 102.1 F (38.9 C) (Oral)   Resp (!) 27   Ht 1.803 m ('5\' 11"'$ )   Wt 121.6 kg   SpO2 94%   BMI 37.38 kg/m   Physical Exam Vitals and nursing note reviewed.  Constitutional:      General: He is not in acute distress.    Appearance: Normal appearance. He is well-developed. He is not ill-appearing.  HENT:     Head: Normocephalic and atraumatic.     Mouth/Throat:     Pharynx: Oropharynx is clear.  Eyes:     Extraocular Movements: Extraocular movements intact.     Conjunctiva/sclera: Conjunctivae normal.     Pupils: Pupils are equal, round, and reactive to light.  Cardiovascular:     Rate and Rhythm: Normal rate and regular rhythm.     Heart sounds: No murmur heard. Pulmonary:     Effort: Pulmonary effort is normal. No respiratory distress.     Breath sounds: Normal breath sounds. No wheezing, rhonchi or rales.     Comments: Patient was a little tachypneic Abdominal:     Palpations: Abdomen is soft.     Tenderness: There is no abdominal tenderness.  Musculoskeletal:     Cervical back: Normal range of motion and neck supple. No rigidity.     Right lower leg: Edema present.     Left lower leg: Edema present.     Comments: With bilateral erythema and chronic skin changes due to chronic edema.  Skin:    General: Skin is warm and dry.     Capillary Refill: Capillary refill takes less than 2 seconds.  Neurological:     General: No focal deficit present.     Mental Status: He is alert and oriented to person, place, and time.     Cranial Nerves: No cranial nerve deficit.     Sensory: Sensory deficit present.    ED Results / Procedures / Treatments   Labs (all labs ordered are listed, but only abnormal results are displayed) Labs  Reviewed  BASIC METABOLIC PANEL - Abnormal; Notable for the following components:      Result Value   Glucose, Bld 135 (*)    All other components within normal limits  CBC - Abnormal; Notable for the following components:   WBC 16.7 (*)     All other components within normal limits  URINALYSIS, ROUTINE W REFLEX MICROSCOPIC - Abnormal; Notable for the following components:   APPearance HAZY (*)    Nitrite POSITIVE (*)    Leukocytes,Ua MODERATE (*)    WBC, UA >50 (*)    Bacteria, UA RARE (*)    All other components within normal limits  LACTIC ACID, PLASMA - Abnormal; Notable for the following components:   Lactic Acid, Venous 3.0 (*)    All other components within normal limits  LACTIC ACID, PLASMA - Abnormal; Notable for the following components:   Lactic Acid, Venous 2.6 (*)    All other components within normal limits  CBG MONITORING, ED - Abnormal; Notable for the following components:   Glucose-Capillary 127 (*)    All other components within normal limits  RESP PANEL BY RT-PCR (FLU A&B, COVID) ARPGX2  CULTURE, BLOOD (ROUTINE X 2)  CULTURE, BLOOD (ROUTINE X 2)  URINE CULTURE  BLOOD GAS, ARTERIAL    EKG EKG Interpretation  Date/Time:  Friday February 02 2021 17:28:18 EDT Ventricular Rate:  90 PR Interval:  186 QRS Duration: 89 QT Interval:  382 QTC Calculation: 468 R Axis:   -11 Text Interpretation: Sinus rhythm Nonspecific repol abnormality, diffuse leads Confirmed by Fredia Sorrow [id number]) on 02/02/2021 5:41:41 PM  Radiology CT Chest W Contrast  Result Date: 02/02/2021 CLINICAL DATA:  Pneumonia, effusion, or abscess suspected EXAM: CT CHEST WITH CONTRAST TECHNIQUE: Multidetector CT imaging of the chest was performed during intravenous contrast administration. CONTRAST:  66m OMNIPAQUE IOHEXOL 350 MG/ML SOLN COMPARISON:  No prior chest CT, correlation is made with radiographs 02/02/2021 FINDINGS: Cardiovascular: The heart is normal in size. No pericardial effusion. Coronary artery calcification. The aorta is normal in size with mild calcifications, which extend into the branch vessels. Two vessel arch. The main pulmonary artery measures up to 3.0 cm, at the upper limit of normal. Mediastinum/Nodes:  Prominent mediastinal lymph nodes, including a right superior paratracheal lymph node that measures 1 cm in short axis (series 2, image 27), left prevascular lymph node that measures up to 1 cm in short axis (series 2, image 62), and a subcarinal lymph node that measures 1 cm in short axis (series 2, image 85). Enlarged right hilar lymph node measures up to 1.4 cm in short axis. Prominent left hilar lymph node measures up to 1 cm in short axis (series 2, image 77). No axillary lymphadenopathy. Prominent subcentimeter paraesophageal lymph nodes Lungs/Pleura: Nodular opacities in the left apex (series 2, image 36). Mild dependent atelectasis. No pleural effusion or pneumothorax. Upper Abdomen: Multiple enlarged lymph nodes, including a 1.1 cm gastrohepatic lymph node (series 2, image 147), a 1 cm precaval lymph node (series 2, image 159), and a 1.7 cm porta hepatis lymph node (series 2, image 158). Nodularity of the left adrenal gland. Musculoskeletal: Multilevel degenerative changes, worst in the lower cervical spine. S shaped curvature. No acute or aggressive osseous finding. IMPRESSION: 1. Nodular opacities in the left apex, which are nonspecific but favored to be infectious or inflammatory, although a neoplastic process could appear similar. 2. Prominent and enlarged mediastinal, bilateral hilar, retroperitoneal, and mesenteric lymph nodes. While the mediastinal and left hilar lymph nodes  could be related to the left apical opacities, the bilaterality and abdominal lymphadenopathy suggest a more diffuse process. Consider CT of the abdomen and pelvis for further evaluation. 3. The main pulmonary artery is at the upper limit of normal in size, which can be seen in thesetting of pulmonary hypertension. 4. Aortic Atherosclerosis (ICD10-I70.0).  Coronary artherosclerosis. These results were called by telephone at the time of interpretation on 02/02/2021 at 6:12 pm to provider Fredia Sorrow , who verbally acknowledged  these results. Electronically Signed   By: Merilyn Baba M.D.   On: 02/02/2021 21:15   CT Abdomen Pelvis W Contrast  Result Date: 02/02/2021 CLINICAL DATA:  Abdominal abscess or infection suspected. Chest CT showed adenopathy in the abdomen. Fever and body aches for 2 days. Bilateral leg swelling. EXAM: CT ABDOMEN AND PELVIS WITH CONTRAST TECHNIQUE: Multidetector CT imaging of the abdomen and pelvis was performed using the standard protocol following bolus administration of intravenous contrast. CONTRAST:  58m OMNIPAQUE IOHEXOL 350 MG/ML SOLN COMPARISON:  CT chest 02/02/2021 FINDINGS: Lower chest: No acute abnormality. Prominent paraesophageal lymph nodes as indicated on previous chest CT. Hepatobiliary: No focal liver abnormality is seen. Status post cholecystectomy. No biliary dilatation. Pancreas: Unremarkable. No pancreatic ductal dilatation or surrounding inflammatory changes. Spleen: Normal in size without focal abnormality. Adrenals/Urinary Tract: Subcentimeter left adrenal gland nodule. Kidneys are symmetrical. No renal mass lesions or hydronephrosis. No hydroureter. Bladder wall is mildly thickened, possibly outlet obstruction or cystitis. Stomach/Bowel: Stomach, small bowel, and colon are not abnormally distended. No wall thickening or inflammatory changes are appreciated. Vascular/Lymphatic: Calcification of the aorta. No aneurysm. Prominent lymph nodes in the gastrohepatic ligament, celiac axis, and retroperitoneum. Largest nodes measure about 11 mm short axis dimension. Bilateral pelvic iliac chain and groin lymph nodes are enlarged. Largest nodes in the right groin measure up to about 17 mm short axis dimension. Reproductive: Prostate gland is enlarged. Other: No free air or free fluid in the abdomen. Abdominal wall musculature appears intact. Musculoskeletal: Degenerative changes in the spine. Focal area of sclerosis in the right iliac bone likely represents a benign bone island. IMPRESSION: 1.  Mildly enlarged but numerous lymph nodes are present in the para esophageal region, retroperitoneum, celiac axis, pelvis, and groin regions bilaterally. Changes are nonspecific and could represent reactive nodes, lymphoproliferative disorder, or metastatic lesions. 2. Prostate enlargement. Mildly thickened bladder wall could represent bladder outlet obstruction or cystitis. 3. Aortic atherosclerosis. Electronically Signed   By: WLucienne CapersM.D.   On: 02/02/2021 22:21   DG Chest Port 1 View  Result Date: 02/02/2021 CLINICAL DATA:  Fever, body aches and shortness of breath. EXAM: PORTABLE CHEST 1 VIEW COMPARISON:  Oct 04, 2017 FINDINGS: Mild, diffuse, chronic appearing increased lung markings are seen. There is no evidence of acute infiltrate, pleural effusion or pneumothorax. The heart size and mediastinal contours are within normal limits. The visualized skeletal structures are unremarkable. IMPRESSION: No acute cardiopulmonary disease. Electronically Signed   By: TVirgina NorfolkM.D.   On: 02/02/2021 19:14   DG Chest Port 1V same Day  Result Date: 02/02/2021 CLINICAL DATA:  Shortness of breath. EXAM: PORTABLE CHEST 1 VIEW COMPARISON:  February 02, 2021 (6:59 p.m.) FINDINGS: Moderate to marked severity diffusely increased interstitial lung markings are seen. Mildly increased interstitial lung markings are noted on the prior study. There is no evidence of focal consolidation, pleural effusion or pneumothorax. The heart size and mediastinal contours are within normal limits. The visualized skeletal structures are unremarkable. IMPRESSION: Moderate to marked  severity interstitial edema. Electronically Signed   By: Virgina Norfolk M.D.   On: 02/02/2021 23:17    Procedures Procedure Name: Intubation Date/Time: 02/02/2021 11:42 PM Performed by: Fredia Sorrow, MD Pre-anesthesia Checklist: Patient identified, Emergency Drugs available and Suction available Oxygen Delivery Method: Ambu  bag Preoxygenation: Pre-oxygenation with 100% oxygen Induction Type: Rapid sequence Ventilation: Mask ventilation without difficulty Laryngoscope Size: Glidescope and 4 Tube size: 7.5 mm Number of attempts: 1 Airway Equipment and Method: Rigid stylet Placement Confirmation: ETT inserted through vocal cords under direct vision, CO2 detector and Breath sounds checked- equal and bilateral Secured at: 23 cm Tube secured with: ETT holder   Patient received etomidate and succinylcholine prior to the intubation.  Intubation went smoothly no difficulties.  Patient with a lot of frothy pulmonary fluids coming up.  He was suctioned.  All findings consistent with flash pulmonary edema.  Patient also received 80 mg of Lasix prior.    Medications Ordered in ED Medications  acetaminophen (TYLENOL) tablet 1,000 mg (1,000 mg Oral Not Given 02/02/21 1955)  lactated ringers infusion (0 mLs Intravenous Stopped 02/02/21 2323)  ceFEPIme (MAXIPIME) 2 g in sodium chloride 0.9 % 100 mL IVPB (has no administration in time range)  vancomycin (VANCOREADY) IVPB 1750 mg/350 mL (has no administration in time range)  nitroGLYCERIN 50 mg in dextrose 5 % 250 mL (0.2 mg/mL) infusion (has no administration in time range)  rocuronium bromide 100 MG/10ML SOSY (has no administration in time range)  rocuronium bromide 100 MG/10ML SOSY (has no administration in time range)  succinylcholine (ANECTINE) 200 MG/10ML syringe (has no administration in time range)  fentaNYL (SUBLIMAZE) 50 MCG/ML injection (has no administration in time range)  etomidate (AMIDATE) 2 MG/ML injection (has no administration in time range)  ketamine HCl 50 MG/5ML SOSY (has no administration in time range)  midazolam (VERSED) 2 MG/2ML injection (has no administration in time range)  propofol (DIPRIVAN) 1000 MG/100ML infusion (has no administration in time range)  acetaminophen (TYLENOL) tablet 650 mg (650 mg Oral Given 02/02/21 1849)  ceFEPIme  (MAXIPIME) 2 g in sodium chloride 0.9 % 100 mL IVPB (0 g Intravenous Stopped 02/02/21 2114)  metroNIDAZOLE (FLAGYL) IVPB 500 mg (0 mg Intravenous Stopped 02/02/21 2323)  lactated ringers bolus 1,000 mL (0 mLs Intravenous Stopped 02/02/21 2323)    And  lactated ringers bolus 1,000 mL (0 mLs Intravenous Stopped 02/02/21 2324)    And  lactated ringers bolus 400 mL (0 mLs Intravenous Stopped 02/02/21 2324)  iohexol (OMNIPAQUE) 350 MG/ML injection 75 mL (75 mLs Intravenous Contrast Given 02/02/21 2034)  vancomycin (VANCOREADY) IVPB 2000 mg/400 mL (2,000 mg Intravenous New Bag/Given 02/02/21 2116)  iohexol (OMNIPAQUE) 350 MG/ML injection 50 mL (50 mLs Intravenous Contrast Given 02/02/21 2144)  albuterol (PROVENTIL) (2.5 MG/3ML) 0.083% nebulizer solution 2.5 mg (2.5 mg Nebulization Given 02/02/21 2301)  furosemide (LASIX) injection 80 mg (80 mg Intravenous Given 02/02/21 2324)    ED Course  I have reviewed the triage vital signs and the nursing notes.  Pertinent labs & imaging results that were available during my care of the patient were reviewed by me and considered in my medical decision making (see chart for details).    MDM Rules/Calculators/A&P                         CRITICAL CARE Performed by: Fredia Sorrow Total critical care time: 60 minutes Critical care time was exclusive of separately billable procedures and treating other patients. Critical care  was necessary to treat or prevent imminent or life-threatening deterioration. Critical care was time spent personally by me on the following activities: development of treatment plan with patient and/or surrogate as well as nursing, discussions with consultants, evaluation of patient's response to treatment, examination of patient, obtaining history from patient or surrogate, ordering and performing treatments and interventions, ordering and review of laboratory studies, ordering and review of radiographic studies, pulse oximetry and re-evaluation  of patient's condition.  Patient presenting with concerns for possible sepsis.  Did have a fever.  But was not hypotensive.  Not tachycardic heart rate was above 90.  Did have some tachypnea.  Oxygen saturations were 93% on room air.  Patient not normally on any oxygen.  As work-up proceeded.  Patient's lactic acid was elevated at 3 white count was markedly elevated.  This was started sepsis protocol received broad-spectrum antibiotics.  And received BMI adjusted 30 cc/kg fluid bolus.  Patient only received 1-1/2 L.  And then he went into flash pulmonary edema.  Patient does have a history of chronic bilateral leg swelling.  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  Patient tolerated the intubation.  Oxygen saturations are now up in the 90s.  Portable chest x-ray was done prior to intubation which consistent with pulmonary edema.  However repeat chest x-ray now has been intubated.  Will start propofol drip.  That does not bring his blood pressures down he we will start him on nitroglycerin to can offload the high blood pressure.  Foley catheter started.  Will recontact hospitalist to let them know of the changes now that he is intubated and would require ICU admission.  Guarding their initial concern for possible sepsis.  Chest x-ray raise some concern CT chest was done patient is being followed by pulm and pulmonology for either an infectious process in his lungs although not being treated with antibiotics and/or a neoplastic process.  They show some evidence of some adenopathy in the abdomen.  So radiology recommended CT of abdomen.  That was done.  Results are still pending on that.  The source of infection could be urine could be lungs.  Patient covered with broad-spectrum antibiotics.  Is important that that initial CT and an chest x-ray did not show pulmonary edema.   Final  Clinical Impression(s) / ED Diagnoses Final diagnoses:  Sepsis, due to unspecified organism, unspecified whether acute organ dysfunction present ([place])  Acute pulmonary edema ([place])  Acute respiratory failure with hypoxia [facility])    Rx / DC Orders ED Discharge Orders     None        Fredia Sorrow, MD 02/02/21 2352  Discussed with Dr. Marchelle Gearing critical care.  Patient will be admitted to the ICU to [facility].  Temporary admit orders completed.    Fredia Sorrow, MD 02/03/21 0020

## 2021-02-02 NOTE — ED Triage Notes (Signed)
Pt became weak with altered mental status while at a local pharmacy. The pt is warm to the touch and had been sitting outside for a while when EMS picked him up.   Pt was seen at [facility] today with c/o of fever and body aches x 2 days. He also c/o swelling to BLE. +SOB with ambulation Pt received a shot of Rocephin.

## 2021-02-03 ENCOUNTER — Inpatient Hospital Stay (HOSPITAL_COMMUNITY): Payer: No Typology Code available for payment source

## 2021-02-03 DIAGNOSIS — L89309 Pressure ulcer of unspecified buttock, unspecified stage: Secondary | ICD-10-CM | POA: Insufficient documentation

## 2021-02-03 DIAGNOSIS — L8990 Pressure ulcer of unspecified site, unspecified stage: Secondary | ICD-10-CM | POA: Insufficient documentation

## 2021-02-03 DIAGNOSIS — A419 Sepsis, unspecified organism: Secondary | ICD-10-CM | POA: Diagnosis not present

## 2021-02-03 DIAGNOSIS — N179 Acute kidney failure, unspecified: Secondary | ICD-10-CM

## 2021-02-03 DIAGNOSIS — J9602 Acute respiratory failure with hypercapnia: Secondary | ICD-10-CM

## 2021-02-03 DIAGNOSIS — R6521 Severe sepsis with septic shock: Secondary | ICD-10-CM

## 2021-02-03 DIAGNOSIS — J9601 Acute respiratory failure with hypoxia: Secondary | ICD-10-CM | POA: Diagnosis not present

## 2021-02-03 DIAGNOSIS — R6520 Severe sepsis without septic shock: Secondary | ICD-10-CM

## 2021-02-03 DIAGNOSIS — J810 Acute pulmonary edema: Secondary | ICD-10-CM | POA: Diagnosis not present

## 2021-02-03 LAB — COMPREHENSIVE METABOLIC PANEL
ALT: 26 U/L (ref 0–44)
AST: 50 U/L — ABNORMAL HIGH (ref 15–41)
Albumin: 2.6 g/dL — ABNORMAL LOW (ref 3.5–5.0)
Alkaline Phosphatase: 48 U/L (ref 38–126)
Anion gap: 14 (ref 5–15)
BUN: 24 mg/dL — ABNORMAL HIGH (ref 8–23)
CO2: 20 mmol/L — ABNORMAL LOW (ref 22–32)
Calcium: 8.4 mg/dL — ABNORMAL LOW (ref 8.9–10.3)
Chloride: 103 mmol/L (ref 98–111)
Creatinine, Ser: 1.76 mg/dL — ABNORMAL HIGH (ref 0.61–1.24)
GFR, Estimated: 40 mL/min — ABNORMAL LOW (ref >60)
Glucose, Bld: 157 mg/dL — ABNORMAL HIGH (ref 70–99)
Potassium: 4.5 mmol/L (ref 3.5–5.1)
Sodium: 137 mmol/L (ref 135–145)
Total Bilirubin: 0.9 mg/dL (ref 0.3–1.2)
Total Protein: 5.9 g/dL — ABNORMAL LOW (ref 6.5–8.1)

## 2021-02-03 LAB — HEMOGLOBIN A1C
Hgb A1c MFr Bld: 6 % — ABNORMAL HIGH (ref 4.8–5.6)
Mean Plasma Glucose: 125.5 mg/dL

## 2021-02-03 LAB — COVID-19, FLU A+B NAA
Influenza A, NAA: NOT DETECTED
Influenza B, NAA: NOT DETECTED
SARS-CoV-2, NAA: NOT DETECTED

## 2021-02-03 LAB — BLOOD GAS, ARTERIAL
Acid-base deficit: 3.5 mmol/L — ABNORMAL HIGH (ref 0.0–2.0)
Bicarbonate: 20.6 mmol/L (ref 20.0–28.0)
FIO2: 100
O2 Saturation: 97.6 %
Patient temperature: 37
pCO2 arterial: 50.2 mmHg — ABNORMAL HIGH (ref 32.0–48.0)
pH, Arterial: 7.271 — ABNORMAL LOW (ref 7.350–7.450)
pO2, Arterial: 137 mmHg — ABNORMAL HIGH (ref 83.0–108.0)

## 2021-02-03 LAB — GLUCOSE, CAPILLARY
Glucose-Capillary: 144 mg/dL — ABNORMAL HIGH (ref 70–99)
Glucose-Capillary: 163 mg/dL — ABNORMAL HIGH (ref 70–99)
Glucose-Capillary: 143 mg/dL — ABNORMAL HIGH (ref 70–99)
Glucose-Capillary: 180 mg/dL — ABNORMAL HIGH (ref 70–99)
Glucose-Capillary: 150 mg/dL — ABNORMAL HIGH (ref 70–99)
Glucose-Capillary: 155 mg/dL — ABNORMAL HIGH (ref 70–99)

## 2021-02-03 LAB — LACTIC ACID, PLASMA
Lactic Acid, Venous: 3.8 mmol/L (ref 0.5–1.9)
Lactic Acid, Venous: 3.8 mmol/L (ref 0.5–1.9)

## 2021-02-03 LAB — MRSA NEXT GEN BY PCR, NASAL: MRSA by PCR Next Gen: NOT DETECTED

## 2021-02-03 LAB — APTT
aPTT: 76 seconds — ABNORMAL HIGH (ref 24–36)
aPTT: 96 seconds — ABNORMAL HIGH (ref 24–36)

## 2021-02-03 LAB — MAGNESIUM: Magnesium: 1.7 mg/dL (ref 1.7–2.4)

## 2021-02-03 LAB — BRAIN NATRIURETIC PEPTIDE: B Natriuretic Peptide: 837.1 pg/mL — ABNORMAL HIGH (ref 0.0–100.0)

## 2021-02-03 LAB — HEPARIN LEVEL (UNFRACTIONATED): Heparin Unfractionated: 1.01 IU/mL — ABNORMAL HIGH (ref 0.30–0.70)

## 2021-02-03 LAB — PHOSPHORUS: Phosphorus: 3.7 mg/dL (ref 2.5–4.6)

## 2021-02-03 MED ORDER — VANCOMYCIN HCL 1250 MG/250ML IV SOLN
1250.0000 mg | INTRAVENOUS | Status: DC
  Administered 2021-02-03 – 2021-02-04 (×2): 1250 mg via INTRAVENOUS
  Filled 2021-02-03 (×3): qty 250

## 2021-02-03 MED ORDER — ORAL CARE MOUTH RINSE
15.0000 mL | OROMUCOSAL | Status: DC
  Administered 2021-02-03 – 2021-02-04 (×11): 15 mL via OROMUCOSAL

## 2021-02-03 MED ORDER — ALBUTEROL SULFATE (2.5 MG/3ML) 0.083% IN NEBU: 2.5000 mg | INHALATION_SOLUTION | Freq: Four times a day (QID) | RESPIRATORY_TRACT | Status: DC | PRN

## 2021-02-03 MED ORDER — LACTATED RINGERS IV BOLUS
500.0000 mL | Freq: Once | INTRAVENOUS | Status: AC
  Administered 2021-02-03: 500 mL via INTRAVENOUS

## 2021-02-03 MED ORDER — POLYETHYLENE GLYCOL 3350 17 G PO PACK
17.0000 g | PACK | Freq: Every day | ORAL | Status: DC
  Administered 2021-02-03: 17 g
  Filled 2021-02-03: qty 1

## 2021-02-03 MED ORDER — CHLORHEXIDINE GLUCONATE CLOTH 2 % EX PADS
6.0000 | MEDICATED_PAD | Freq: Every day | CUTANEOUS | Status: DC
  Administered 2021-02-03 – 2021-02-15 (×10): 6 via TOPICAL

## 2021-02-03 MED ORDER — DEXMEDETOMIDINE HCL IN NACL 400 MCG/100ML IV SOLN
0.0000 ug/kg/h | INTRAVENOUS | Status: DC
  Administered 2021-02-03: 0.4 ug/kg/h via INTRAVENOUS
  Administered 2021-02-03: 0.9 ug/kg/h via INTRAVENOUS
  Administered 2021-02-03: 1 ug/kg/h via INTRAVENOUS
  Administered 2021-02-03 (×2): 0.4 ug/kg/h via INTRAVENOUS
  Administered 2021-02-04 (×2): 0.3 ug/kg/h via INTRAVENOUS
  Filled 2021-02-03: qty 200
  Filled 2021-02-03 (×3): qty 100

## 2021-02-03 MED ORDER — ACETAMINOPHEN 160 MG/5ML PO SOLN
650.0000 mg | Freq: Four times a day (QID) | ORAL | Status: DC | PRN
  Administered 2021-02-03 – 2021-02-04 (×2): 650 mg
  Filled 2021-02-03 (×2): qty 20.3

## 2021-02-03 MED ORDER — CHLORHEXIDINE GLUCONATE 0.12% ORAL RINSE (MEDLINE KIT)
15.0000 mL | Freq: Two times a day (BID) | OROMUCOSAL | Status: DC
  Administered 2021-02-03 – 2021-02-04 (×3): 15 mL via OROMUCOSAL

## 2021-02-03 MED ORDER — SODIUM CHLORIDE 0.9 % IV SOLN
250.0000 mL | INTRAVENOUS | Status: DC
  Administered 2021-02-03: 250 mL via INTRAVENOUS

## 2021-02-03 MED ORDER — CHLORHEXIDINE GLUCONATE CLOTH 2 % EX PADS: 6.0000 | MEDICATED_PAD | Freq: Every day | CUTANEOUS | Status: DC

## 2021-02-03 MED ORDER — PANTOPRAZOLE SODIUM 40 MG IV SOLR
40.0000 mg | Freq: Every day | INTRAVENOUS | Status: DC
  Administered 2021-02-03 – 2021-02-04 (×2): 40 mg via INTRAVENOUS
  Filled 2021-02-03 (×2): qty 40

## 2021-02-03 MED ORDER — DOCUSATE SODIUM 50 MG/5ML PO LIQD
100.0000 mg | Freq: Two times a day (BID) | ORAL | Status: DC
  Administered 2021-02-03 (×2): 100 mg
  Filled 2021-02-03 (×2): qty 10

## 2021-02-03 MED ORDER — INSULIN ASPART 100 UNIT/ML IJ SOLN
0.0000 [IU] | INTRAMUSCULAR | Status: DC
  Administered 2021-02-03 (×2): 1 [IU] via SUBCUTANEOUS
  Administered 2021-02-04: 2 [IU] via SUBCUTANEOUS
  Administered 2021-02-04 (×2): 1 [IU] via SUBCUTANEOUS

## 2021-02-03 MED ORDER — DEXMEDETOMIDINE HCL IN NACL 400 MCG/100ML IV SOLN
0.4000 ug/kg/h | INTRAVENOUS | Status: DC
  Filled 2021-02-03 (×2): qty 100

## 2021-02-03 MED ORDER — ACETAMINOPHEN 325 MG PO TABS: 650.0000 mg | ORAL_TABLET | Freq: Four times a day (QID) | ORAL | Status: DC | PRN

## 2021-02-03 MED ORDER — FENTANYL CITRATE (PF) 100 MCG/2ML IJ SOLN: 50.0000 ug | INTRAMUSCULAR | Status: DC | PRN

## 2021-02-03 MED ORDER — NOREPINEPHRINE 4 MG/250ML-% IV SOLN
0.0000 ug/min | INTRAVENOUS | Status: DC
  Administered 2021-02-03: 2 ug/min via INTRAVENOUS
  Filled 2021-02-03: qty 250

## 2021-02-03 MED ORDER — FENTANYL CITRATE PF 50 MCG/ML IJ SOSY
25.0000 ug | PREFILLED_SYRINGE | Freq: Once | INTRAMUSCULAR | Status: AC
  Administered 2021-02-03: 25 ug via INTRAVENOUS
  Filled 2021-02-03: qty 1

## 2021-02-03 MED ORDER — NOREPINEPHRINE 4 MG/250ML-% IV SOLN
2.0000 ug/min | INTRAVENOUS | Status: DC
  Administered 2021-02-03: 2 ug/min via INTRAVENOUS
  Filled 2021-02-03: qty 250

## 2021-02-03 MED ORDER — LACTATED RINGERS IV SOLN: INTRAVENOUS | Status: DC

## 2021-02-03 MED ORDER — SODIUM CHLORIDE 0.9 % IV SOLN
2.0000 g | Freq: Two times a day (BID) | INTRAVENOUS | Status: DC
  Administered 2021-02-03 – 2021-02-05 (×4): 2 g via INTRAVENOUS
  Filled 2021-02-03 (×4): qty 2

## 2021-02-03 MED ORDER — HEPARIN (PORCINE) 25000 UT/250ML-% IV SOLN
1500.0000 [IU]/h | INTRAVENOUS | Status: DC
  Administered 2021-02-03 – 2021-02-04 (×3): 1500 [IU]/h via INTRAVENOUS
  Filled 2021-02-03 (×3): qty 250

## 2021-02-03 NOTE — Progress Notes (Signed)
Date and time results received: 02/03/21 0741  Test: Lactic Acid Critical Value: 3.8  Name of Provider Notified: Dr. Vaughan Browner  Orders Received? Or Actions Taken?: Order to administer 500 bolus LR IV.

## 2021-02-03 NOTE — Progress Notes (Signed)
Pharmacy Antibiotic Note  Gary Carroll is a 75 y.o. male admitted on 02/02/2021 with sepsis.  Pharmacy has been consulted for Vanco, Cefepime dosing.  SCr up to 1.76. WBC 16.7  Vanco 9/16>> Cefepime 9/16>>  9/16: COVID/Flu: neg 9/16: BC x 2>> ngtd 9/16 UCx >> sent    Plan: Cefepime 2g IV q 12 hours  Decrease vancomycin 1250 mg IV Q 24 hrs. Goal AUC 400-550.Expected AUC: 461 SCr used: 1.76 Monitor CBC, renal fx, cultures and clinical progress Vanc levels as indicated   Height: '5\' 11"'$  (180.3 cm) Weight: 122.8 kg (270 lb 11.6 oz) IBW/kg (Calculated) : 75.3  Temp (24hrs), Avg:102.4 F (39.1 C), Min:100 F (37.8 C), Max:103.6 F (39.8 C)  Recent Labs  Lab 02/02/21 1713 02/02/21 1908 02/02/21 2053 02/03/21 0608  WBC 16.7*  --   --   --   CREATININE 1.09  --   --  1.76*  LATICACIDVEN  --  3.0* 2.6* 3.8*     Estimated Creatinine Clearance: 48.4 mL/min (A) (by C-G formula based on SCr of 1.76 mg/dL (H)).    Allergies  Allergen Reactions   Diovan [Valsartan] Cough   Tagamet [Cimetidine] Other (See Comments)    irrittability   Albertina Parr, PharmD., BCPS, BCCCP Clinical Pharmacist Please refer to  [facility] for unit-specific pharmacist

## 2021-02-03 NOTE — Progress Notes (Signed)
ANTICOAGULATION CONSULT NOTE  Pharmacy Consult for Heparin Indication: afib (Eliquis PTA)  Allergies  Allergen Reactions   Diovan [Valsartan] Cough   Tagamet [Cimetidine] Other (See Comments)    irrittability    Patient Measurements: Height: '5\' 11"'$  (180.3 cm) Weight: 122.8 kg (270 lb 11.6 oz) IBW/kg (Calculated) : 75.3 Heparin Dosing Weight: 102 kg  Vital Signs: Temp: 97.7 F (36.5 C) (09/17 2030) BP: 91/53 (09/17 2030) Pulse Rate: 52 (09/17 2030)  Labs: Recent Labs    02/02/21 1713 02/03/21 0608 02/03/21 1321 02/03/21 2054  HGB 13.8  --   --   --   HCT 42.3  --   --   --   PLT 162  --   --   --   APTT  --   --  76* 96*  HEPARINUNFRC  --   --  1.01*  --   CREATININE 1.09 1.76*  --   --      Estimated Creatinine Clearance: 48.4 mL/min (A) (by C-G formula based on SCr of 1.76 mg/dL (H)).   Medical History: Past Medical History:  Diagnosis Date   Allergic rhinitis    Arthritis    Asthma    seasonal per pt   Bilateral knee pain    OA   Diverticulosis 02/2012   seen on colonoscopy   Erectile dysfunction    GERD (gastroesophageal reflux disease)    Heart murmur    Hiatal hernia    HLD (hyperlipidemia)    HTN (hypertension)    Hydronephrosis determined by ultrasound 08/22/2015   Overactive bladder    Prediabetes    Ringing of ears    Sleep apnea    Urinary frequency    Urinary urgency    Wears glasses     Medications:  Medications Prior to Admission  Medication Sig Dispense Refill Last Dose   apixaban (ELIQUIS) 5 MG TABS tablet Take 1 tablet (5 mg total) by mouth 2 (two) times daily. 60 tablet 2 02/02/2021 at 0600   Calcium Carb-Cholecalciferol (CALCIUM 500 +D PO) Take by mouth.   02/01/2021   diphenhydrAMINE (BENADRYL) 25 MG tablet Take 50 mg by mouth at bedtime as needed for sleep.   02/01/2021   furosemide (LASIX) 40 MG tablet Take 40 mg by mouth daily.   Past Week   loratadine (CLARITIN) 10 MG tablet Take 10 mg by mouth daily as needed.   unknown    losartan (COZAAR) 50 MG tablet Take 50 mg by mouth daily.   02/01/2021   MAGNESIUM PO Take by mouth.   02/01/2021   Melatonin 5 MG TABS Take 10 mg by mouth at bedtime.    02/01/2021   metoprolol tartrate (LOPRESSOR) 25 MG tablet Take 1 tablet (25 mg total) by mouth 2 (two) times daily. (Patient taking differently: Take 25 mg by mouth 2 (two) times daily. Two Tablets in the morning and one in the Evening.) 60 tablet 2 02/02/2021 at 0600   Multiple Vitamins-Minerals (MULTIVITAL) tablet Take 1 tablet by mouth daily.   02/02/2021   Omega-3 Fatty Acids (FISH OIL) 1000 MG CAPS Take 4 capsules by mouth daily.    02/02/2021   oxybutynin (DITROPAN) 5 MG tablet Take 5 mg by mouth daily.   02/01/2021   potassium chloride (K-DUR) 10 MEQ tablet Take 1 tablet (10 mEq total) by mouth daily. 30 tablet 0 02/01/2021   rosuvastatin (CRESTOR) 20 MG tablet Take 20 mg by mouth daily.   02/01/2021   terbinafine (LAMISIL) 1 % cream  APPLY MODERATE AMOUNT TO AFFECTED AREA TWICE A DAY   unknown   vitamin C (ASCORBIC ACID) 500 MG tablet Take 500 mg by mouth daily.   02/01/2021   ciprofloxacin-dexamethasone (CIPRODEX) OTIC suspension Place 4 drops into both ears 2 (two) times daily. (Patient not taking: No sig reported) 7.5 mL 0 Not Taking   doxycycline (VIBRAMYCIN) 100 MG capsule Take 1 capsule (100 mg total) by mouth 2 (two) times daily. 20 capsule 0    fluticasone (FLONASE) 50 MCG/ACT nasal spray Place 1 spray into both nostrils daily for 14 days. (Patient not taking: Reported on 02/02/2021) 16 g 0 Not Taking   Potassium Gluconate 2.5 MEQ TABS Take 1 tablet by mouth daily. (Patient not taking: No sig reported)   Not Taking   pravastatin (PRAVACHOL) 40 MG tablet  (Patient not taking: No sig reported)   Not Taking    Assessment: 75 y.o. M presents with sepsis. Pt on Eliquis PTA for afib. Last dose 9/16 0600. Transitioned to IV heparin while admitted.   -aPTT is therapeutic at 96s on 1500 units/hr. H Goal of Therapy:  Heparin  level 0.3-0.7 units/ml, aPTT 66-102 sec Monitor platelets by anticoagulation protocol: Yes   Plan:  Continue heparin gtt at 1500 units/hr.  Daily aPTT, heparin level, and CBC  Hildred Laser, PharmD Clinical Pharmacist **Pharmacist phone directory can now be found on [email or web address] ([email or web address]).  Listed under [place].

## 2021-02-03 NOTE — Progress Notes (Signed)
Sepsis tracking by eLINK 

## 2021-02-03 NOTE — ED Notes (Signed)
Patients peep reduced from 8 to 5, for BP

## 2021-02-03 NOTE — Progress Notes (Addendum)
NAME:  Gary Carroll, MRN:  [id number], DOB:  [birth year], LOS: 1 ADMISSION DATE:  02/02/2021, CONSULTATION DATE:  9/17 REFERRING MD:  Rogene Houston MD, CHIEF COMPLAINT:  Weakness   History of Present Illness:  Patient is encephalopathic and/or intubated. Therefore history has been obtained from chart review.   Gary Carroll, is a 75 y.o. 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 There was concern for celliutis of the lower legs. He was prescribed doxycycline and ceftriaxone and discharged. He became weak with AMS while at a pharmacy. EMS was called and he was brought to the [facility] ED.  ED course was notable for Tmax 102.1, WBC 16.7, LA 3>2.6. Sepsis was suspected. Cultures were obtained. He was started on vance and cefepime. While receiving his 30cc/kg fluid bolus, the patient developed flash pulmonary edema, requiring intubation.   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 Vanc/Cefepime. Intubated d/t flash pulmonary edema 9/17 TXR to [facility]  Interim History / Subjective:   Remains on pressors with low urine output Lactic acid and creatinine increased on a.m. lab  Objective   Blood pressure 109/74, pulse 72, temperature (!) 102.9 F (39.4 C), resp. rate (!) 28, height '5\' 11"'$  (1.803 m), weight 122.8 kg, SpO2 100 %.    Vent Mode: PRVC FiO2 (%):  [60 %-100 %] 60 % Set Rate:  [28 bmp] 28 bmp Vt Set:  [600 mL] 600 mL PEEP:  [5 cmH20-8 cmH20] 5 cmH20 Plateau Pressure:  [16 cmH20-21  cmH20] 16 cmH20   Intake/Output Summary (Last 24 hours) at 02/03/2021 0827 Last data filed at 02/03/2021 0750 Gross per 24 hour  Intake 2422.08 ml  Output 1200 ml  Net 1222.08 ml   Filed Weights   02/02/21 1658 02/03/21 0320  Weight: 121.6 kg 122.8 kg   Examination: Gen:      No acute distress HEENT:  EOMI, sclera anicteric Neck:     No masses; no thyromegaly, ETT Lungs:    Clear to auscultation bilaterally; normal respiratory effort CV:         Regular rate and rhythm; no murmurs Abd:      + bowel sounds; soft, non-tender; no palpable masses, no distension Ext:    Lower extremity edema, chronic skin changes of the lower extremity consistent with stasis dermatitis Skin:      Warm and dry; no rash Neuro: Sedated  Lab/imaging personally reviewed Significant for glucose 157, BUN/creatinine 24/1.76 Lactic acid 3.8, WBC count 16.7 Chest x-ray 9/17 with interstitial changes, stable ET tube position  Resolved Hospital Problem list     Assessment & Plan:  Septic Shock Lactic acidosis Celluitis, BL LE On 10 of levophed. Lactate 3.0>2.6. WBC 16.7. UA + for nitrites, WBCs, COVID -. Previously seen by Dr. Lamonte Sakai for abnormal CT of chest with Nonspecific peribronchovascular inflammatory changes with unclear etiology. CT chest on 9/17 with nodular opacities in left apex. ?Urologic vs respiratory vs BL LE cellulitis. ?of cardiogenic component with  new/worsening HF with two days of increased leg swelling. Appears more like peripheral vascular disease than cellulitis.   Remains on pressors.  Fluids have been held due to concern for pulmonary edema however he is on the vent with minimal vent settings Suspect he will need more fluids as lactic acid and creatinine is worsening Will resume LR Continue broad antibiotic coverage with Vanco, cefepime Follow echocardiogram, cultures  Acute respiratory failure with hypoxia and hypercarbia Flash pulmonary edema OSA  Continue vent support, follow  ABG, chest x-ray  Hx Afib Dr. Lamonte Sakai lists in note Hx of Afib. Patient is in NSR on exam. Patient intubated so unable to confirm.  On eliquis per MAR. On metop 25 BID -Goal K above 4, goal MG above 2 Heparin drip  HX HTN HX HLD Holding home antihypertensives at this time.  HX GERD -PPI  Hx tobacco use 47 pack years -cessation education when appropriate  HX seasonal asthma Not on home BD -PRN albuterol  Multiple enlarged abdominal lymph nodes Seen on 9/16 CT -follow up outpatient with primary.  Best Practice (right click and "Reselect all SmartList Selections" daily)   Diet/type: NPO DVT prophylaxis: systemic heparin GI prophylaxis: PPI Lines: N/A Foley:  Yes, and it is still needed Code Status:  full code Last date of multidisciplinary goals of care discussion [pending]  Critical care time:    The patient is critically ill with multiple organ system failure and requires high complexity decision making for assessment and support, frequent evaluation and titration of therapies, advanced monitoring, review of radiographic studies and interpretation of complex data.   Critical Care Time devoted to patient care services, exclusive of separately billable procedures, described in this note is 35 minutes.   Marshell Garfinkel MD [facility] See Amion for pager  If no response to pager , please call [phone] until 7pm After 7:00 pm call Elink  [phone] 02/03/2021, 8:27 AM

## 2021-02-03 NOTE — ED Notes (Signed)
Patient had been placed on BiPAP briefly while in room 3 upon transfer to room 2 he became nauseated and went in to frank pulmonary edema . He was on 16/8 f 20 100 percent oxygen. Albuterol aerogen running in line. At this time he was intubated 7.5 ET at 25 place on vent still in pulmonary edema.

## 2021-02-03 NOTE — Progress Notes (Signed)
ANTICOAGULATION CONSULT NOTE - Initial Consult  Pharmacy Consult for Heparin Indication: afib (Eliquis PTA)  Allergies  Allergen Reactions   Diovan [Valsartan] Cough   Tagamet [Cimetidine] Other (See Comments)    irrittability    Patient Measurements: Height: '5\' 11"'$  (180.3 cm) Weight: 122.8 kg (270 lb 11.6 oz) IBW/kg (Calculated) : 75.3 Heparin Dosing Weight: 102 kg  Vital Signs: Temp: 97.7 F (36.5 C) (09/17 1200) Temp Source: Core (09/17 0330) BP: 102/64 (09/17 1200) Pulse Rate: 56 (09/17 1200)  Labs: Recent Labs    02/02/21 1713 02/03/21 0608 02/03/21 1321  HGB 13.8  --   --   HCT 42.3  --   --   PLT 162  --   --   APTT  --   --  76*  HEPARINUNFRC  --   --  1.01*  CREATININE 1.09 1.76*  --      Estimated Creatinine Clearance: 48.4 mL/min (A) (by C-G formula based on SCr of 1.76 mg/dL (H)).   Medical History: Past Medical History:  Diagnosis Date   Allergic rhinitis    Arthritis    Asthma    seasonal per pt   Bilateral knee pain    OA   Diverticulosis 02/2012   seen on colonoscopy   Erectile dysfunction    GERD (gastroesophageal reflux disease)    Heart murmur    Hiatal hernia    HLD (hyperlipidemia)    HTN (hypertension)    Hydronephrosis determined by ultrasound 08/22/2015   Overactive bladder    Prediabetes    Ringing of ears    Sleep apnea    Urinary frequency    Urinary urgency    Wears glasses     Medications:  Medications Prior to Admission  Medication Sig Dispense Refill Last Dose   apixaban (ELIQUIS) 5 MG TABS tablet Take 1 tablet (5 mg total) by mouth 2 (two) times daily. 60 tablet 2 02/02/2021 at 0600   Calcium Carb-Cholecalciferol (CALCIUM 500 +D PO) Take by mouth.   02/01/2021   diphenhydrAMINE (BENADRYL) 25 MG tablet Take 50 mg by mouth at bedtime as needed for sleep.   02/01/2021   furosemide (LASIX) 40 MG tablet Take 40 mg by mouth daily.   Past Week   loratadine (CLARITIN) 10 MG tablet Take 10 mg by mouth daily as needed.    unknown   losartan (COZAAR) 50 MG tablet Take 50 mg by mouth daily.   02/01/2021   MAGNESIUM PO Take by mouth.   02/01/2021   Melatonin 5 MG TABS Take 10 mg by mouth at bedtime.    02/01/2021   metoprolol tartrate (LOPRESSOR) 25 MG tablet Take 1 tablet (25 mg total) by mouth 2 (two) times daily. (Patient taking differently: Take 25 mg by mouth 2 (two) times daily. Two Tablets in the morning and one in the Evening.) 60 tablet 2 02/02/2021 at 0600   Multiple Vitamins-Minerals (MULTIVITAL) tablet Take 1 tablet by mouth daily.   02/02/2021   Omega-3 Fatty Acids (FISH OIL) 1000 MG CAPS Take 4 capsules by mouth daily.    02/02/2021   oxybutynin (DITROPAN) 5 MG tablet Take 5 mg by mouth daily.   02/01/2021   potassium chloride (K-DUR) 10 MEQ tablet Take 1 tablet (10 mEq total) by mouth daily. 30 tablet 0 02/01/2021   rosuvastatin (CRESTOR) 20 MG tablet Take 20 mg by mouth daily.   02/01/2021   terbinafine (LAMISIL) 1 % cream APPLY MODERATE AMOUNT TO AFFECTED AREA TWICE A DAY  unknown   vitamin C (ASCORBIC ACID) 500 MG tablet Take 500 mg by mouth daily.   02/01/2021   ciprofloxacin-dexamethasone (CIPRODEX) OTIC suspension Place 4 drops into both ears 2 (two) times daily. (Patient not taking: No sig reported) 7.5 mL 0 Not Taking   doxycycline (VIBRAMYCIN) 100 MG capsule Take 1 capsule (100 mg total) by mouth 2 (two) times daily. 20 capsule 0    fluticasone (FLONASE) 50 MCG/ACT nasal spray Place 1 spray into both nostrils daily for 14 days. (Patient not taking: Reported on 02/02/2021) 16 g 0 Not Taking   Potassium Gluconate 2.5 MEQ TABS Take 1 tablet by mouth daily. (Patient not taking: No sig reported)   Not Taking   pravastatin (PRAVACHOL) 40 MG tablet  (Patient not taking: No sig reported)   Not Taking    Assessment: 75 y.o. M presents with sepsis. Pt on Eliquis PTA for afib. Last dose 9/16 0600. Transitioned to IV heparin while admitted.   Initial aPTT is therapeutic at 77s on 1500 units/hr. HL is not yet  correlating due to apixaban effects. H/H and Plt wnl. No overt s/s of bleeding noted.   Goal of Therapy:  Heparin level 0.3-0.7 units/ml, aPTT 66-102 sec Monitor platelets by anticoagulation protocol: Yes   Plan:  Continue heparin gtt at 1500 units/hr. No bolus 8 hr heparin level and aPTT Daily aPTT, heparin level, and CBC  Albertina Parr, PharmD., BCPS, BCCCP Clinical Pharmacist Please refer to [facility] for unit-specific pharmacist

## 2021-02-03 NOTE — H&P (Addendum)
NAME:  Gary Carroll, MRN:  [id number], DOB:  1945/07/06, LOS: 1 ADMISSION DATE:  02/02/2021, CONSULTATION DATE:  9/17 REFERRING Carroll:  Rogene Houston Carroll, CHIEF COMPLAINT:  Weakness   History of Present Illness:  Patient is encephalopathic and/or intubated. Therefore history has been obtained from chart review.   Gary Carroll, is a 75 y.o. 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 There was concern for celliutis of the lower legs. He was prescribed doxycycline and ceftriaxone and discharged. He became weak with AMS while at a pharmacy. EMS was called and he was brought to the [facility] ED.  ED course was notable for Tmax 102.1, WBC 16.7, LA 3>2.6. Sepsis was suspected. Cultures were obtained. He was started on vance and cefepime. While receiving his 30cc/kg fluid bolus, the patient developed flash pulmonary edema, requiring intubation.   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 Vanc/Cefepime. Intubated d/t flash pulmonary edema 9/17 TXR to [facility]  Interim History / Subjective:  See above  69mg levophed, 365m propofol  Unable to obtain subjective evaluation due to patient status  Objective   Blood pressure (!) 80/60, pulse 81, temperature (!) 102.4 F (39.1 C), temperature source Core, resp. rate (!) 28, height '5\' 11"'$  (1.803 m), weight 122.8 kg, SpO2 97 %.    Vent Mode: PRVC FiO2 (%):  [60 %-100 %] 60 % Set Rate:  [28 bmp] 28 bmp Vt Set:  [600 mL] 600 mL PEEP:  [5 cmH20-8 cmH20]  5 cmH20 Plateau Pressure:  [16 cmH20-21 cmH20] 16 cmH20   Intake/Output Summary (Last 24 hours) at 02/03/2021 0405 Last data filed at 02/03/2021 0330 Gross per 24 hour  Intake 2127.31 ml  Output 650 ml  Net 1477.31 ml   Filed Weights   02/02/21 1658 02/03/21 0320  Weight: 121.6 kg 122.8 kg    Examination: General:  in bed, NAD, appears comfortable HEENT: MM pink/moist, anicteric, atraumatic Neuro: sedated, RASS -4, PERRL 40m53mV: S1S2, SR on monitor, no m/r/g appreciated PULM: air movement in all lobes, Trachea midline, chest expansion symmetric GI: rounded, bsx4 hypoactive, soft   Extremities: warm/dry in upper extremities, cool in lower extremities,  generalized edema in BLLE, capillary refill less than 3 seconds  Skin: RLE with some blisters on posterior leg, slightly red BLLE.    Resolved Hospital Problem list     Assessment & Plan:  Septic Shock Lactic acidosis Celluitis, BL LE On 10 of levophed. Lactate 3.0>2.6. WBC 16.7. UA + for nitrites, WBCs, COVID -. Previously seen by Dr. ByrLamonte Sakair abnormal CT of chest with Nonspecific peribronchovascular inflammatory changes with unclear etiology. CT chest on 9/17 with nodular opacities in left apex. ?Urologic vs respiratory vs BL LE cellulitis. ?of cardiogenic component with new/worsening HF with two days of increased leg swelling. Appears more like peripheral vascular disease than cellulitis.  -Goal MAP 65 or greater. -Holding off on further fluid resuscitation in the setting of flash pulmonary edema. +1.9 L.  -  Follow up BC and UC -On Cefepime and Vancomycin. Narrow as cultures result. -Obtain ECHO -Follow up CBC, BMP, BNP, MG, Phos  Acute respiratory failure with hypoxia and hypercarbia Flash pulmonary edema OSA  Suspect secondary to [facility] from resuscitation. ABG 7.27/50/137/20. -LTVV strategy with tidal volumes of 4-8 cc/kg ideal body weight. Continue positive pressure ventilation. -Goal plateau pressures less than 30 and  driving pressures less than 15 -Wean PEEP/FiO2 for SpO2 92-98% -VAP bundle -Daily SAT and SBT -Stop propofol. PAD bundle with Precedex gtt and fentanyl push -Follow intermittent CXR and ABG PRN -Holding off on diuresis due to levophed requirement  Hx Afib Dr. Lamonte Sakai lists in note Hx of Afib. Patient is in NSR on exam. Patient intubated so unable to confirm.  On eliquis per MAR. On metop 25 BID -Goal K above 4, goal MG above 2 -monitor tele -Start heparin gtt for AC  HX HTN HX HLD -Holding home antihypertensives at this time.  HX GERD -PPI  Hx tobacco use 47 pack years -cessation education when appropriate  HX seasonal asthma Not on home BD -PRN albuterol  Multiple enlarged abdominal lymph nodes Seen on 9/16 CT -follow up outpatient with primary.  Best Practice (right click and "Reselect all SmartList Selections" daily)   Diet/type: NPO DVT prophylaxis: systemic heparin GI prophylaxis: PPI Lines: N/A Foley:  Yes, and it is still needed Code Status:  full code Last date of multidisciplinary goals of care discussion [pending]  Labs   CBC: Recent Labs  Lab 02/02/21 1713  WBC 16.7*  HGB 13.8  HCT 42.3  MCV 89.8  PLT 0000000    Basic Metabolic Panel: Recent Labs  Lab 02/02/21 1713  NA 136  K 4.2  CL 105  CO2 23  GLUCOSE 135*  BUN 22  CREATININE 1.09  CALCIUM 9.0   GFR: Estimated Creatinine Clearance: 78.1 mL/min (by C-G formula based on SCr of 1.09 mg/dL). Recent Labs  Lab 02/02/21 1713 02/02/21 1908 02/02/21 2053  WBC 16.7*  --   --   LATICACIDVEN  --  3.0* 2.6*    Liver Function Tests: No results for input(s): AST, ALT, ALKPHOS, BILITOT, PROT, ALBUMIN in the last 168 hours. No results for input(s): LIPASE, AMYLASE in the last 168 hours. No results for input(s): AMMONIA in the last 168 hours.  ABG    Component Value Date/Time   PHART 7.271 (L) 02/03/2021 0021   PCO2ART 50.2 (H) 02/03/2021 0021   PO2ART 137 (H) 02/03/2021 0021   HCO3 20.6  02/03/2021 0021   ACIDBASEDEF 3.5 (H) 02/03/2021 0021   O2SAT 97.6 02/03/2021 0021     Coagulation Profile: No results for input(s): INR, PROTIME in the last 168 hours.  Cardiac Enzymes: No results for input(s): CKTOTAL, CKMB, CKMBINDEX, TROPONINI in the last 168 hours.  HbA1C: Hgb A1c MFr Bld  Date/Time Value Ref Range Status  10/04/2017 04:39 AM 5.6 4.8 - 5.6 % Final    Comment:    (NOTE) Pre diabetes:          5.7%-6.4% Diabetes:              >6.4% Glycemic control for   <7.0% adults with diabetes   06/08/2015 11:42 AM 6.1 (H) 4.8 - 5.6 % Final    Comment:    (NOTE)         Pre-diabetes: 5.7 - 6.4         Diabetes: >6.4         Glycemic control for adults with  diabetes: <7.0     CBG: Recent Labs  Lab 02/02/21 1723 02/03/21 0319  GLUCAP 127* 144*    Review of Systems:   Unable to obtain a ROS due to patient status  Past Medical History:  He,  has a past medical history of Allergic rhinitis, Arthritis, Asthma, Bilateral knee pain, Diverticulosis (02/2012), Erectile dysfunction, GERD (gastroesophageal reflux disease), Heart murmur, Hiatal hernia, HLD (hyperlipidemia), HTN (hypertension), Hydronephrosis determined by ultrasound (08/22/2015), Overactive bladder, Prediabetes, Ringing of ears, Sleep apnea, Urinary frequency, Urinary urgency, and Wears glasses.   Surgical History:   Past Surgical History:  Procedure Laterality Date   CHOLECYSTECTOMY     COLONOSCOPY  02/2012   Dr. 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 His smoking use  included cigarettes. He has never used smokeless tobacco. He reports that he does not currently use alcohol after a past usage of about 1.0 - 2.0 standard drink per week. He reports that he does not use drugs.   Family History:  His family history includes Cancer in his mother; Coronary artery disease in his father and paternal grandfather; Heart disease in his paternal grandfather; Uterine cancer in his mother. There is no history of Colon cancer, Esophageal cancer, Stomach cancer, Rectal cancer, or Liver disease.   Allergies Allergies  Allergen Reactions   Diovan [Valsartan] Cough   Tagamet [Cimetidine] Other (See Comments)    irrittability     Home Medications  Prior to Admission medications   Medication Sig Start Date End Date Taking? Authorizing Provider  apixaban (ELIQUIS) 5 MG TABS tablet Take 1 tablet (5 mg total) by mouth 2 (two) times daily. 08/24/15  Yes Isaac Bliss, Rayford Halsted, Carroll  Calcium Carb-Cholecalciferol (CALCIUM 500 +D PO) Take by mouth.   Yes Provider, Historical, Carroll  diphenhydrAMINE (BENADRYL) 25 MG tablet Take 50 mg by mouth at bedtime as needed for sleep.   Yes Provider, Historical, Carroll  furosemide (LASIX) 40 MG tablet Take 40 mg by mouth daily. 07/27/19  Yes Provider, Historical, Carroll  loratadine (CLARITIN) 10 MG tablet Take 10 mg by mouth daily as needed.   Yes Provider, Historical, Carroll  losartan (COZAAR) 50 MG tablet Take 50 mg by mouth daily.   Yes Provider, Historical, Carroll  MAGNESIUM PO Take by mouth.   Yes Provider, Historical, Carroll  Melatonin 5 MG TABS Take 10 mg by mouth at bedtime.    Yes Provider, Historical, Carroll  metoprolol tartrate (LOPRESSOR) 25 MG tablet Take 1 tablet (25 mg total) by mouth 2 (two) times daily. Patient taking differently: Take 25 mg by mouth 2 (two) times daily. Two Tablets in the morning and one in the Evening. 08/24/15  Yes Isaac Bliss, Rayford Halsted, Carroll  Multiple Vitamins-Minerals (MULTIVITAL) tablet Take 1 tablet by mouth daily.   Yes Provider,  Historical, Carroll  Omega-3 Fatty Acids (FISH OIL) 1000 MG CAPS Take 4 capsules by mouth daily.    Yes Provider, Historical, Carroll  oxybutynin (DITROPAN) 5 MG tablet Take 5 mg by mouth daily. 06/01/19  Yes Provider, Historical, Carroll  potassium chloride (K-DUR) 10 MEQ tablet Take 1 tablet (10 mEq total) by mouth daily. 10/09/17  Yes Kathie Dike, Carroll  rosuvastatin (CRESTOR) 20 MG tablet Take 20 mg by mouth daily.   Yes Provider, Historical, Carroll  terbinafine (LAMISIL) 1 % cream APPLY MODERATE AMOUNT TO AFFECTED AREA TWICE A DAY 12/13/20  Yes Provider, Historical, Carroll  vitamin C (ASCORBIC ACID) 500 MG tablet Take 500 mg by mouth daily.   Yes Provider, Historical, Carroll  ciprofloxacin-dexamethasone (CIPRODEX) OTIC suspension Place 4 drops into both ears 2 (two) times daily. Patient not taking: No sig reported 11/05/19   Emerson Monte, FNP  doxycycline (VIBRAMYCIN) 100 MG capsule Take 1 capsule (100 mg total) by mouth 2 (two) times daily. 02/02/21   [place], [place], PA-C  fluticasone (FLONASE) 50 MCG/ACT nasal spray Place 1 spray into both nostrils daily for 14 days. Patient not taking: Reported on 02/02/2021 11/05/19 11/19/19  Emerson Monte, FNP  Potassium Gluconate 2.5 MEQ TABS Take 1 tablet by mouth daily. Patient not taking: No sig reported 07/22/17   Provider, Historical, Carroll  pravastatin (PRAVACHOL) 40 MG tablet  01/05/19   Provider, Historical, Carroll     Critical care time: 35 minutes    Redmond School., MSN, APRN, AGACNP-BC [facility]  02/03/2021 , 4:05 AM  Please see [email or web address] for pager details  If no response, please call [phone] After hours, please call Elink at [phone]

## 2021-02-03 NOTE — Progress Notes (Signed)
ANTICOAGULATION CONSULT NOTE - Initial Consult  Pharmacy Consult for Heparin Indication: afib (Eliquis PTA)  Allergies  Allergen Reactions   Diovan [Valsartan] Cough   Tagamet [Cimetidine] Other (See Comments)    irrittability    Patient Measurements: Height: '5\' 11"'$  (180.3 cm) Weight: 122.8 kg (270 lb 11.6 oz) IBW/kg (Calculated) : 75.3 Heparin Dosing Weight: 102 kg  Vital Signs: Temp: 102.4 F (39.1 C) (09/17 0415) Temp Source: Core (09/17 0330) BP: 139/88 (09/17 0415) Pulse Rate: 88 (09/17 0415)  Labs: Recent Labs    02/02/21 1713  HGB 13.8  HCT 42.3  PLT 162  CREATININE 1.09    Estimated Creatinine Clearance: 78.1 mL/min (by C-G formula based on SCr of 1.09 mg/dL).   Medical History: Past Medical History:  Diagnosis Date   Allergic rhinitis    Arthritis    Asthma    seasonal per pt   Bilateral knee pain    OA   Diverticulosis 02/2012   seen on colonoscopy   Erectile dysfunction    GERD (gastroesophageal reflux disease)    Heart murmur    Hiatal hernia    HLD (hyperlipidemia)    HTN (hypertension)    Hydronephrosis determined by ultrasound 08/22/2015   Overactive bladder    Prediabetes    Ringing of ears    Sleep apnea    Urinary frequency    Urinary urgency    Wears glasses     Medications:  Medications Prior to Admission  Medication Sig Dispense Refill Last Dose   apixaban (ELIQUIS) 5 MG TABS tablet Take 1 tablet (5 mg total) by mouth 2 (two) times daily. 60 tablet 2 02/02/2021 at 0600   Calcium Carb-Cholecalciferol (CALCIUM 500 +D PO) Take by mouth.   02/01/2021   diphenhydrAMINE (BENADRYL) 25 MG tablet Take 50 mg by mouth at bedtime as needed for sleep.   02/01/2021   furosemide (LASIX) 40 MG tablet Take 40 mg by mouth daily.   Past Week   loratadine (CLARITIN) 10 MG tablet Take 10 mg by mouth daily as needed.   unknown   losartan (COZAAR) 50 MG tablet Take 50 mg by mouth daily.   02/01/2021   MAGNESIUM PO Take by mouth.   02/01/2021    Melatonin 5 MG TABS Take 10 mg by mouth at bedtime.    02/01/2021   metoprolol tartrate (LOPRESSOR) 25 MG tablet Take 1 tablet (25 mg total) by mouth 2 (two) times daily. (Patient taking differently: Take 25 mg by mouth 2 (two) times daily. Two Tablets in the morning and one in the Evening.) 60 tablet 2 02/02/2021 at 0600   Multiple Vitamins-Minerals (MULTIVITAL) tablet Take 1 tablet by mouth daily.   02/02/2021   Omega-3 Fatty Acids (FISH OIL) 1000 MG CAPS Take 4 capsules by mouth daily.    02/02/2021   oxybutynin (DITROPAN) 5 MG tablet Take 5 mg by mouth daily.   02/01/2021   potassium chloride (K-DUR) 10 MEQ tablet Take 1 tablet (10 mEq total) by mouth daily. 30 tablet 0 02/01/2021   rosuvastatin (CRESTOR) 20 MG tablet Take 20 mg by mouth daily.   02/01/2021   terbinafine (LAMISIL) 1 % cream APPLY MODERATE AMOUNT TO AFFECTED AREA TWICE A DAY   unknown   vitamin C (ASCORBIC ACID) 500 MG tablet Take 500 mg by mouth daily.   02/01/2021   ciprofloxacin-dexamethasone (CIPRODEX) OTIC suspension Place 4 drops into both ears 2 (two) times daily. (Patient not taking: No sig reported) 7.5 mL 0 Not Taking  doxycycline (VIBRAMYCIN) 100 MG capsule Take 1 capsule (100 mg total) by mouth 2 (two) times daily. 20 capsule 0    fluticasone (FLONASE) 50 MCG/ACT nasal spray Place 1 spray into both nostrils daily for 14 days. (Patient not taking: Reported on 02/02/2021) 16 g 0 Not Taking   Potassium Gluconate 2.5 MEQ TABS Take 1 tablet by mouth daily. (Patient not taking: No sig reported)   Not Taking   pravastatin (PRAVACHOL) 40 MG tablet  (Patient not taking: No sig reported)   Not Taking    Assessment: 75 y.o. M presents with sepsis. Pt on Eliquis PTA for afib. Last dose 9/16 0600. To begin heparin gtt. Utilize aPTT for monitoring until heparin levels and aPTT correlating as Eliquis will affect heparin levels. CBC ok on admission.  Goal of Therapy:  Heparin level 0.3-0.7 units/ml, aPTT 66-102 sec Monitor platelets by  anticoagulation protocol: Yes   Plan:  Heparin gtt at 1500 units/hr. No bolus 8 hr heparin level and aPTT Daily aPTT, heparin level, and CBC  Sherlon Handing, PharmD, BCPS Please see amion for complete clinical pharmacist phone list 02/03/2021,4:23 AM

## 2021-02-03 NOTE — Progress Notes (Signed)
[facility] Progress Note Patient Name: Gary Carroll DOB: [birth year] MRN: [id number]   Date of Service  02/03/2021  HPI/Events of Note  In ICU sedated on mech ventilation; on levophed 29mg; also on prop 36m  eICU Interventions  Stable and sedated; levophed dose lowered from 25 to 10 earlier     Intervention Category Evaluation Type: New Patient Evaluation  MiTilden Dome/17/2022, 3:50 AM

## 2021-02-04 ENCOUNTER — Inpatient Hospital Stay (HOSPITAL_COMMUNITY): Payer: No Typology Code available for payment source

## 2021-02-04 DIAGNOSIS — J9601 Acute respiratory failure with hypoxia: Secondary | ICD-10-CM

## 2021-02-04 DIAGNOSIS — I4891 Unspecified atrial fibrillation: Secondary | ICD-10-CM | POA: Diagnosis not present

## 2021-02-04 DIAGNOSIS — J9602 Acute respiratory failure with hypercapnia: Secondary | ICD-10-CM | POA: Diagnosis not present

## 2021-02-04 LAB — GLUCOSE, CAPILLARY
Glucose-Capillary: 106 mg/dL — ABNORMAL HIGH (ref 70–99)
Glucose-Capillary: 113 mg/dL — ABNORMAL HIGH (ref 70–99)
Glucose-Capillary: 117 mg/dL — ABNORMAL HIGH (ref 70–99)
Glucose-Capillary: 124 mg/dL — ABNORMAL HIGH (ref 70–99)
Glucose-Capillary: 137 mg/dL — ABNORMAL HIGH (ref 70–99)
Glucose-Capillary: 151 mg/dL — ABNORMAL HIGH (ref 70–99)
Glucose-Capillary: 95 mg/dL (ref 70–99)

## 2021-02-04 LAB — CBC
HCT: 41.2 % (ref 39.0–52.0)
Hemoglobin: 13.3 g/dL (ref 13.0–17.0)
MCH: 28.7 pg (ref 26.0–34.0)
MCHC: 32.3 g/dL (ref 30.0–36.0)
MCV: 88.8 fL (ref 80.0–100.0)
Platelets: 120 10*3/uL — ABNORMAL LOW (ref 150–400)
RBC: 4.64 MIL/uL (ref 4.22–5.81)
RDW: 15.2 % (ref 11.5–15.5)
WBC: 10.3 10*3/uL (ref 4.0–10.5)
nRBC: 0 % (ref 0.0–0.2)

## 2021-02-04 LAB — ECHOCARDIOGRAM COMPLETE
AR max vel: 1.15 cm2
AV Area VTI: 1.3 cm2
AV Area mean vel: 1.21 cm2
AV Mean grad: 22.5 mmHg
AV Peak grad: 40.7 mmHg
Ao pk vel: 3.19 m/s
Area-P 1/2: 2.71 cm2
Height: 71 in
P 1/2 time: 391 msec
Weight: 4331.6 oz

## 2021-02-04 LAB — MAGNESIUM: Magnesium: 1.9 mg/dL (ref 1.7–2.4)

## 2021-02-04 LAB — HEPARIN LEVEL (UNFRACTIONATED): Heparin Unfractionated: 0.72 IU/mL — ABNORMAL HIGH (ref 0.30–0.70)

## 2021-02-04 LAB — BASIC METABOLIC PANEL
Anion gap: 10 (ref 5–15)
BUN: 23 mg/dL (ref 8–23)
CO2: 22 mmol/L (ref 22–32)
Calcium: 8.5 mg/dL — ABNORMAL LOW (ref 8.9–10.3)
Chloride: 106 mmol/L (ref 98–111)
Creatinine, Ser: 1.14 mg/dL (ref 0.61–1.24)
GFR, Estimated: >60 mL/min (ref >60)
Glucose, Bld: 139 mg/dL — ABNORMAL HIGH (ref 70–99)
Potassium: 3.6 mmol/L (ref 3.5–5.1)
Sodium: 138 mmol/L (ref 135–145)

## 2021-02-04 LAB — URINE CULTURE: Culture: <10000 — AB

## 2021-02-04 LAB — LACTIC ACID, PLASMA: Lactic Acid, Venous: 2.3 mmol/L (ref 0.5–1.9)

## 2021-02-04 LAB — APTT: aPTT: 82 seconds — ABNORMAL HIGH (ref 24–36)

## 2021-02-04 LAB — PHOSPHORUS: Phosphorus: 2.8 mg/dL (ref 2.5–4.6)

## 2021-02-04 MED ORDER — DOCUSATE SODIUM 100 MG PO CAPS
100.0000 mg | ORAL_CAPSULE | Freq: Two times a day (BID) | ORAL | Status: DC
  Administered 2021-02-04 – 2021-02-16 (×14): 100 mg via ORAL
  Filled 2021-02-04 (×18): qty 1

## 2021-02-04 MED ORDER — POLYETHYLENE GLYCOL 3350 17 G PO PACK
17.0000 g | PACK | Freq: Every day | ORAL | Status: DC
  Administered 2021-02-04 – 2021-02-16 (×2): 17 g via ORAL
  Filled 2021-02-04 (×9): qty 1

## 2021-02-04 MED ORDER — FUROSEMIDE 10 MG/ML IJ SOLN
20.0000 mg | Freq: Once | INTRAMUSCULAR | Status: AC
  Administered 2021-02-04: 20 mg via INTRAVENOUS
  Filled 2021-02-04: qty 2

## 2021-02-04 NOTE — Progress Notes (Signed)
  Echocardiogram 2D Echocardiogram has been performed.  Gary Killings 02/04/2021, 11:10 AM

## 2021-02-04 NOTE — Procedures (Signed)
Extubation Procedure Note  Patient Details:   Name: Gary Carroll DOB: 22-Jul-1945 MRN: [id number]   Airway Documentation:    Vent end date: 02/04/21 Vent end time: 0852   Evaluation  O2 sats: stable throughout Complications: No apparent complications Patient did tolerate procedure well. Bilateral Breath Sounds: Clear, Diminished   Patient extubated per MD order & placed on 4L [place]. Patient able to speak & has a good strong cough.  Kathie Dike 02/04/2021, 8:56 AM

## 2021-02-04 NOTE — Progress Notes (Signed)
NAME:  Gary Carroll, MRN:  [id number], DOB:  1945/07/06, LOS: 2 ADMISSION DATE:  02/02/2021, CONSULTATION DATE:  9/17 REFERRING MD:  Rogene Houston MD, CHIEF COMPLAINT:  Weakness   History of Present Illness:  Patient is encephalopathic and/or intubated. Therefore history has been obtained from chart review.   Gary Carroll, is a 75 y.o. 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 There was concern for celliutis of the lower legs. He was prescribed doxycycline and ceftriaxone and discharged. He became weak with AMS while at a pharmacy. EMS was called and he was brought to the [facility] ED.  ED course was notable for Tmax 102.1, WBC 16.7, LA 3>2.6. Sepsis was suspected. Cultures were obtained. He was started on vance and cefepime. While receiving his 30cc/kg fluid bolus, the patient developed flash pulmonary edema, requiring intubation.   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 Vanc/Cefepime. Intubated d/t flash pulmonary edema 9/17 TXR to [facility] 9/18 Weaned off pressors  Interim History / Subjective:   Weaned off pressors  On PSV trials today AM  Objective   Blood pressure 129/81, pulse (!) 57, temperature (!) 96.3 F (35.7 C), resp. rate (!) 27, height '5\' 11"'$  (1.803 m), weight 122.8 kg, SpO2 98 %.    Vent Mode: PRVC FiO2 (%):  [50 %] 50 % Set Rate:  [28 bmp] 28 bmp Vt Set:  [600 mL] 600 mL PEEP:  [5 cmH20] 5 cmH20 Plateau Pressure:  [16 cmH20-18 cmH20] 18 cmH20   Intake/Output  Summary (Last 24 hours) at 02/04/2021 0824 Last data filed at 02/04/2021 0700 Gross per 24 hour  Intake 4902.88 ml  Output 1560 ml  Net 3342.88 ml   Filed Weights   02/02/21 1658 02/03/21 0320  Weight: 121.6 kg 122.8 kg   Examination: Gen:      Awake, no distress HEENT:  EOMI, sclera anicteric Neck:     No masses; no thyromegaly ET tube Lungs:    Clear to auscultation bilaterally; normal respiratory effort CV:         Regular rate and rhythm; no murmurs Abd:      + bowel sounds; soft, non-tender; no palpable masses, no distension Ext:    No edema; adequate peripheral perfusion Skin:      Warm and dry; no rash Neuro: Awake on vent, responsive  Labs/imaging reviewed Creatinine improved to 1.14, lactic acid improved to 2.3 Chest x-ray with cardiomegaly, vascular congestion  Resolved Hospital Problem list     Assessment & Plan:  Septic Shock present on admission Lactic acidosis Celluitis, BL LE Off pressors, lactic acid is improving We will stop IV fluids SSM vascular congestion on chest x-ray Continue Vanco, cefepime Echocardiogram pending.  Follow cultures  Acute respiratory failure with hypoxia and hypercarbia Flash pulmonary edema OSA  Weaning on pressure support Likely extubation today Follow intermittent chest x-ray  Hx Afib Dr. Lamonte Sakai lists in note Hx of Afib. Patient is in NSR on exam. Patient  intubated so unable to confirm.  On eliquis per MAR. On metop 25 BID Heparin drip  HX HTN HX HLD Holding home antihypertensives at this time.  HX GERD -PPI  Hx tobacco use 47 pack years -cessation education when appropriate  HX seasonal asthma Not on home BD -PRN albuterol  Multiple enlarged abdominal lymph nodes, upper lobe nodular opacity Previously seen by Dr. Lamonte Sakai for abnormal CT of chest with Nonspecific peribronchovascular inflammatory changes with unclear etiology. CT chest on 9/17 with nodular opacities in left apex. Plan for outpatient  followup  Best Practice (right click and "Reselect all SmartList Selections" daily)   Diet/type: NPO DVT prophylaxis: systemic heparin GI prophylaxis: PPI Lines: N/A Foley:  Yes, and it is still needed Code Status:  full code Last date of multidisciplinary goals of care discussion [pending]  Critical care time:    The patient is critically ill with multiple organ system failure and requires high complexity decision making for assessment and support, frequent evaluation and titration of therapies, advanced monitoring, review of radiographic studies and interpretation of complex data.   Critical Care Time devoted to patient care services, exclusive of separately billable procedures, described in this note is 35 minutes.   Marshell Garfinkel MD [facility] See Amion for pager  If no response to pager , please call [phone] until 7pm After 7:00 pm call Elink  [phone] 02/04/2021, 8:24 AM

## 2021-02-04 NOTE — Progress Notes (Signed)
[facility] for Heparin Indication: afib (Eliquis PTA)  Allergies  Allergen Reactions   Diovan [Valsartan] Cough   Tagamet [Cimetidine] Other (See Comments)    irrittability    Patient Measurements: Height: '5\' 11"'$  (180.3 cm) Weight: 122.8 kg (270 lb 11.6 oz) IBW/kg (Calculated) : 75.3 Heparin Dosing Weight: 102 kg  Vital Signs: Temp: 96.3 F (35.7 C) (09/18 0900) BP: 124/74 (09/18 0900) Pulse Rate: 74 (09/18 0900)  Labs: Recent Labs    02/02/21 1713 02/03/21 0608 02/03/21 1321 02/03/21 2054 02/04/21 0425  HGB 13.8  --   --   --  13.3  HCT 42.3  --   --   --  41.2  PLT 162  --   --   --  120*  APTT  --   --  76* 96* 82*  HEPARINUNFRC  --   --  1.01*  --  0.72*  CREATININE 1.09 1.76*  --   --  1.14     Estimated Creatinine Clearance: 74.7 mL/min (by C-G formula based on SCr of 1.14 mg/dL).  Assessment: 75 y.o. M presents with sepsis. Pt on Eliquis PTA for afib. Last dose 9/16 0600. Transitioned to IV heparin while admitted.   -aPTT is therapeutic at 82s on 1500 units/hr.   Goal of Therapy:  Heparin level 0.3-0.7 units/ml, aPTT 66-102 sec Monitor platelets by anticoagulation protocol: Yes   Plan:  Continue heparin gtt at 1500 units/hr.  Daily aPTT, heparin level, and CBC  Barth Kirks, PharmD, BCCCP Clinical Pharmacist [phone]  Please check AMION for all [place] numbers  02/04/2021 10:59 AM

## 2021-02-05 ENCOUNTER — Inpatient Hospital Stay (HOSPITAL_COMMUNITY): Payer: No Typology Code available for payment source

## 2021-02-05 DIAGNOSIS — J810 Acute pulmonary edema: Secondary | ICD-10-CM | POA: Diagnosis not present

## 2021-02-05 LAB — BASIC METABOLIC PANEL
Anion gap: 12 (ref 5–15)
BUN: 22 mg/dL (ref 8–23)
CO2: 22 mmol/L (ref 22–32)
Calcium: 8.5 mg/dL — ABNORMAL LOW (ref 8.9–10.3)
Chloride: 106 mmol/L (ref 98–111)
Creatinine, Ser: 1.05 mg/dL (ref 0.61–1.24)
GFR, Estimated: >60 mL/min (ref >60)
Glucose, Bld: 105 mg/dL — ABNORMAL HIGH (ref 70–99)
Potassium: 3.4 mmol/L — ABNORMAL LOW (ref 3.5–5.1)
Sodium: 140 mmol/L (ref 135–145)

## 2021-02-05 LAB — CBC
HCT: 40.3 % (ref 39.0–52.0)
Hemoglobin: 13.1 g/dL (ref 13.0–17.0)
MCH: 29.3 pg (ref 26.0–34.0)
MCHC: 32.5 g/dL (ref 30.0–36.0)
MCV: 90.2 fL (ref 80.0–100.0)
Platelets: 133 10*3/uL — ABNORMAL LOW (ref 150–400)
RBC: 4.47 MIL/uL (ref 4.22–5.81)
RDW: 15.3 % (ref 11.5–15.5)
WBC: 8.4 10*3/uL (ref 4.0–10.5)
nRBC: 0 % (ref 0.0–0.2)

## 2021-02-05 LAB — MAGNESIUM: Magnesium: 2.3 mg/dL (ref 1.7–2.4)

## 2021-02-05 LAB — APTT: aPTT: 81 seconds — ABNORMAL HIGH (ref 24–36)

## 2021-02-05 LAB — GLUCOSE, CAPILLARY
Glucose-Capillary: 104 mg/dL — ABNORMAL HIGH (ref 70–99)
Glucose-Capillary: 109 mg/dL — ABNORMAL HIGH (ref 70–99)
Glucose-Capillary: 131 mg/dL — ABNORMAL HIGH (ref 70–99)
Glucose-Capillary: 121 mg/dL — ABNORMAL HIGH (ref 70–99)
Glucose-Capillary: 126 mg/dL — ABNORMAL HIGH (ref 70–99)

## 2021-02-05 LAB — PHOSPHORUS: Phosphorus: 3.1 mg/dL (ref 2.5–4.6)

## 2021-02-05 LAB — HEPARIN LEVEL (UNFRACTIONATED): Heparin Unfractionated: 0.61 IU/mL (ref 0.30–0.70)

## 2021-02-05 MED ORDER — ROSUVASTATIN CALCIUM 20 MG PO TABS
20.0000 mg | ORAL_TABLET | Freq: Every day | ORAL | Status: DC
  Administered 2021-02-05 – 2021-02-16 (×12): 20 mg via ORAL
  Filled 2021-02-05 (×12): qty 1

## 2021-02-05 MED ORDER — POTASSIUM CHLORIDE 20 MEQ PO PACK
20.0000 meq | PACK | Freq: Once | ORAL | Status: AC
  Administered 2021-02-05: 20 meq via ORAL
  Filled 2021-02-05: qty 1

## 2021-02-05 MED ORDER — OXYBUTYNIN CHLORIDE 5 MG PO TABS
5.0000 mg | ORAL_TABLET | Freq: Every day | ORAL | Status: DC
  Administered 2021-02-05 – 2021-02-06 (×2): 5 mg via ORAL
  Filled 2021-02-05 (×2): qty 1

## 2021-02-05 MED ORDER — LOSARTAN POTASSIUM 50 MG PO TABS
50.0000 mg | ORAL_TABLET | Freq: Every day | ORAL | Status: DC
  Administered 2021-02-05: 50 mg via ORAL
  Filled 2021-02-05 (×2): qty 1

## 2021-02-05 MED ORDER — INSULIN ASPART 100 UNIT/ML IJ SOLN: 0.0000 [IU] | Freq: Every day | INTRAMUSCULAR | Status: DC

## 2021-02-05 MED ORDER — INSULIN ASPART 100 UNIT/ML IJ SOLN
0.0000 [IU] | Freq: Three times a day (TID) | INTRAMUSCULAR | Status: DC
  Administered 2021-02-05 (×2): 2 [IU] via SUBCUTANEOUS

## 2021-02-05 MED ORDER — CEFTRIAXONE SODIUM 1 G IJ SOLR
1.0000 g | INTRAMUSCULAR | Status: DC
  Administered 2021-02-05 – 2021-02-06 (×2): 1 g via INTRAVENOUS
  Filled 2021-02-05 (×2): qty 10

## 2021-02-05 MED ORDER — ACETAMINOPHEN 325 MG PO TABS
650.0000 mg | ORAL_TABLET | Freq: Four times a day (QID) | ORAL | Status: DC | PRN
  Administered 2021-02-06 – 2021-02-15 (×14): 650 mg via ORAL
  Filled 2021-02-05 (×14): qty 2

## 2021-02-05 MED ORDER — APIXABAN 5 MG PO TABS
5.0000 mg | ORAL_TABLET | Freq: Two times a day (BID) | ORAL | Status: DC
  Administered 2021-02-05 – 2021-02-16 (×23): 5 mg via ORAL
  Filled 2021-02-05 (×3): qty 1
  Filled 2021-02-05 (×2): qty 2
  Filled 2021-02-05 (×18): qty 1

## 2021-02-05 MED ORDER — FUROSEMIDE 10 MG/ML IJ SOLN
40.0000 mg | Freq: Once | INTRAMUSCULAR | Status: AC
  Administered 2021-02-05: 40 mg via INTRAVENOUS
  Filled 2021-02-05: qty 4

## 2021-02-05 MED ORDER — ACETAMINOPHEN 160 MG/5ML PO SOLN
650.0000 mg | Freq: Four times a day (QID) | ORAL | Status: DC | PRN
  Administered 2021-02-05: 650 mg
  Filled 2021-02-05: qty 20.3

## 2021-02-05 MED ORDER — ACETAMINOPHEN 160 MG/5ML PO SOLN: 650.0000 mg | Freq: Four times a day (QID) | ORAL | Status: DC | PRN

## 2021-02-05 MED ORDER — LOSARTAN POTASSIUM 25 MG PO TABS
25.0000 mg | ORAL_TABLET | Freq: Once | ORAL | Status: AC
  Administered 2021-02-05: 25 mg via ORAL
  Filled 2021-02-05: qty 1

## 2021-02-05 NOTE — Plan of Care (Signed)
  Problem: Clinical Measurements: Goal: Respiratory complications will improve Outcome: Progressing Note: Pt is doing well off ventilator. Oxygen decreased from 3L to 2L this morning and pt is tolerating it well with oxygen saturation at 93%   Problem: Nutrition: Goal: Adequate nutrition will be maintained Outcome: Progressing   Problem: Pain Managment: Goal: General experience of comfort will improve Outcome: Progressing   Problem: Activity: Goal: Risk for activity intolerance will decrease Outcome: Not Progressing Note: Pt has not mobilized since admission. Will attempt to mobilize to chair this morning.

## 2021-02-05 NOTE — Discharge Instructions (Signed)

## 2021-02-05 NOTE — Progress Notes (Addendum)
NAME:  Gary Carroll, MRN:  [id number], DOB:  1945/07/15, LOS: 3 ADMISSION DATE:  02/02/2021, CONSULTATION DATE:  9/17 REFERRING MD:  Rogene Houston MD, CHIEF COMPLAINT:  Weakness   History of Present Illness:  Patient is encephalopathic and/or intubated. Therefore history has been obtained from chart review.   Gary Carroll, is a 75 y.o. 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 There was concern for celliutis of the lower legs. He was prescribed doxycycline and ceftriaxone and discharged. He became weak with AMS while at a pharmacy. EMS was called and he was brought to the [facility] ED.  ED course was notable for Tmax 102.1, WBC 16.7, LA 3>2.6. Sepsis was suspected. Cultures were obtained. He was started on vance and cefepime. While receiving his 30cc/kg fluid bolus, the patient developed flash pulmonary edema, requiring intubation.   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 Vanc/Cefepime. Intubated d/t flash pulmonary edema 9/17 TXR to [facility] 9/18 Weaned off pressors, extubated 9/19 tolerating orals, transition out of  ICU  Interim History / Subjective:   NAEON. Doing ok. Looks edematous. Bps rising.  Objective   Blood pressure (!) 174/76, pulse 92, temperature 98.5 F (36.9 C), resp. rate (!) 27, height '5\' 11"'$  (1.803 m), weight 122.8 kg, SpO2 93 %.        Intake/Output Summary (Last 24 hours) at 02/05/2021 1047 Last data filed at 02/05/2021 0800 Gross per 24 hour  Intake  1108.55 ml  Output 2175 ml  Net -1066.45 ml    Filed Weights   02/02/21 1658 02/03/21 0320  Weight: 121.6 kg 122.8 kg   Examination: Gen:      Awake, no distress HEENT:  EOMI, sclera anicteric Lungs:    Clear to auscultation bilaterally; normal respiratory effort CV:         Regular rate and rhythm; no murmurs Abd:      + bowel sounds; soft, non-tender; no palpable masses, no distension Ext:    Ruddy erythema less, with pitting edema LE and UE Skin:      Warm and dry; no rash Neuro: Awake on vent, responsive  Labs/imaging reviewed  Resolved Hospital Problem list     Assessment & Plan:  Septic Shock present on admission Lactic acidosis Due to UTI (complicated due to self cath) CTX to complete 7 day course (end 9/20)  Acute respiratory failure with hypoxia and hypercarbia Flash pulmonary edema OSA  Diurese IV lasix 9/19 CPAP at night Wean O2 sat goal 88%  AKI: Cr better. Due to ATN in setting of septic shock.   Venous stasis disease: ruddy bilateral erythema bilaterally, felt not be be cellulitis at this time. Diurese  Hx Afib Resume Eliquis 9/19 (hep gtt previously)  HX HTN HX HLD Resume losartan, IV lasix Resume statin  Hx tobacco use 47 pack years -PRN albuterol  HX seasonal asthma Not on home BD PRN albuterol  Multiple enlarged abdominal lymph nodes, upper lobe nodular opacity Previously  seen by Dr. Lamonte Sakai for abnormal CT of chest with Nonspecific peribronchovascular inflammatory changes with unclear etiology. CT chest on 9/17 with nodular opacities in left apex. Plan for outpatient followup  Best Practice (right click and "Reselect all SmartList Selections" daily)   Diet/type: Regular consistency (see orders) DVT prophylaxis: DOAC GI prophylaxis: N/A and PPI Lines: N/A Foley:  Yes, and it is still needed Code Status:  full code Last date of multidisciplinary goals of care discussion [pending]  Transfer out of ICU  Critical care time:     N/a  Lanier Clam, MD [facility] See Amion for pager  If no response to pager , please call [phone] until 7pm After 7:00 pm call Elink  [phone] 02/05/2021, 10:47 AM

## 2021-02-05 NOTE — Progress Notes (Signed)
[facility] notified of hospitalization. Reference [id number]. Whitman Hero RN,BSN,CM

## 2021-02-05 NOTE — Progress Notes (Signed)
[facility] Progress Note Patient Name: Gary Carroll DOB: 05-07-1946 MRN: [id number]   Date of Service  02/05/2021  HPI/Events of Note  potassium 3.4, cret 1.05, GFR >60; taking PO and only has PIVs  Started on cozar, low dose.  eICU Interventions  Klor con 20 meq oral once. Follow labs in AM.      Intervention Category Intermediate Interventions: Electrolyte abnormality - evaluation and management  Elmer Sow 02/05/2021, 2:15 AM

## 2021-02-05 NOTE — Progress Notes (Signed)
Contacted [facility] for patients SBP creeping up even at rest. Current SBP 178. Patient noted to take metoprolol and losartan at home. Will await orders from doctor for BP medication.

## 2021-02-05 NOTE — Progress Notes (Signed)
[facility] Progress Note Patient Name: Gary Carroll DOB: 11-06-1945 MRN: [id number]   Date of Service  02/05/2021  HPI/Events of Note  pt BP elevated, current 176/71 map 100 Does not have any PRNs for BP.  Pt takes Cozaar 50mg  qd and metoprolol 50mg  in am and 25 mg pm according to his med list but has not had it since his admission.  S/p septic shock. Extubated on 18 th Cr 1.14  HR 87.  eICU Interventions  - can take oral - start Cozaar 25 mg oral for now.      Intervention Category Intermediate Interventions: Hypertension - evaluation and management  Elmer Sow 02/05/2021, 12:34 AM

## 2021-02-06 DIAGNOSIS — R3914 Feeling of incomplete bladder emptying: Secondary | ICD-10-CM

## 2021-02-06 DIAGNOSIS — I480 Paroxysmal atrial fibrillation: Secondary | ICD-10-CM | POA: Diagnosis not present

## 2021-02-06 DIAGNOSIS — G4730 Sleep apnea, unspecified: Secondary | ICD-10-CM

## 2021-02-06 DIAGNOSIS — N401 Enlarged prostate with lower urinary tract symptoms: Secondary | ICD-10-CM

## 2021-02-06 DIAGNOSIS — J9601 Acute respiratory failure with hypoxia: Secondary | ICD-10-CM | POA: Diagnosis not present

## 2021-02-06 DIAGNOSIS — A419 Sepsis, unspecified organism: Secondary | ICD-10-CM | POA: Diagnosis not present

## 2021-02-06 DIAGNOSIS — J810 Acute pulmonary edema: Secondary | ICD-10-CM | POA: Diagnosis not present

## 2021-02-06 LAB — BASIC METABOLIC PANEL
Anion gap: 10 (ref 5–15)
BUN: 20 mg/dL (ref 8–23)
CO2: 20 mmol/L — ABNORMAL LOW (ref 22–32)
Calcium: 8.3 mg/dL — ABNORMAL LOW (ref 8.9–10.3)
Chloride: 110 mmol/L (ref 98–111)
Creatinine, Ser: 0.91 mg/dL (ref 0.61–1.24)
GFR, Estimated: >60 mL/min (ref >60)
Glucose, Bld: 120 mg/dL — ABNORMAL HIGH (ref 70–99)
Potassium: 3.4 mmol/L — ABNORMAL LOW (ref 3.5–5.1)
Sodium: 140 mmol/L (ref 135–145)

## 2021-02-06 LAB — CBC
HCT: 39.6 % (ref 39.0–52.0)
Hemoglobin: 12.6 g/dL — ABNORMAL LOW (ref 13.0–17.0)
MCH: 28.8 pg (ref 26.0–34.0)
MCHC: 31.8 g/dL (ref 30.0–36.0)
MCV: 90.6 fL (ref 80.0–100.0)
Platelets: 155 10*3/uL (ref 150–400)
RBC: 4.37 MIL/uL (ref 4.22–5.81)
RDW: 15.2 % (ref 11.5–15.5)
WBC: 7.1 10*3/uL (ref 4.0–10.5)
nRBC: 0 % (ref 0.0–0.2)

## 2021-02-06 LAB — GLUCOSE, CAPILLARY: Glucose-Capillary: 103 mg/dL — ABNORMAL HIGH (ref 70–99)

## 2021-02-06 MED ORDER — METOPROLOL TARTRATE 25 MG PO TABS: 25.0000 mg | ORAL_TABLET | Freq: Two times a day (BID) | ORAL | Status: DC

## 2021-02-06 MED ORDER — FUROSEMIDE 40 MG PO TABS
40.0000 mg | ORAL_TABLET | Freq: Every day | ORAL | Status: DC
  Administered 2021-02-06 – 2021-02-16 (×11): 40 mg via ORAL
  Filled 2021-02-06 (×11): qty 1

## 2021-02-06 MED ORDER — METOPROLOL TARTRATE 25 MG PO TABS
25.0000 mg | ORAL_TABLET | Freq: Two times a day (BID) | ORAL | Status: DC
  Administered 2021-02-06 – 2021-02-08 (×5): 25 mg via ORAL
  Filled 2021-02-06 (×5): qty 1

## 2021-02-06 MED ORDER — POTASSIUM CHLORIDE CRYS ER 20 MEQ PO TBCR
40.0000 meq | EXTENDED_RELEASE_TABLET | ORAL | Status: AC
Start: 2021-02-06 — End: 2021-02-06
  Administered 2021-02-06 (×2): 40 meq via ORAL
  Filled 2021-02-06 (×2): qty 2

## 2021-02-06 MED ORDER — LOSARTAN POTASSIUM 50 MG PO TABS
50.0000 mg | ORAL_TABLET | Freq: Every day | ORAL | Status: DC
  Administered 2021-02-07 – 2021-02-16 (×9): 50 mg via ORAL
  Filled 2021-02-06 (×11): qty 1

## 2021-02-06 NOTE — Progress Notes (Signed)
PROGRESS NOTE    Gary Carroll   ZOX:[id number]  DOB: [birth year]  DOA: 02/02/2021 PCP: Celene Squibb, MD   Brief Narrative:  Gary Carroll is a 75 year old male with hypertension, atrial fibrillation on anticoagulation, hyperlipidemia, GERD, obstructive sleep apnea, chronic venous stasis and history of staph infection of lower extremities. The patient presented to ER on 9/16 with complaints of 2 days of fatigue, body aches, leg swelling and redness in the legs.  Urgent care gave him a dose of ceftriaxone, placed him on doxycycline due to a concern for cellulitis.  While he was at the pharmacy he became increasingly weak and was sent to the [facility] ED. In the ED: Temperature 102.1, WBC count 16.7, lactic acid 3 - Patient was started on broad-spectrum antibiotics and fluid boluses.  While receiving IV fluids the patient developed flash pulmonary edema and was intubated in the ED. On 9/17, he was subsequently transferred to [facility]. On 9/18, pressors were weaned off and the patient was extubated He was transferred out of the ICU on 9/20.  Subjective: Sitting up in bed, alert.  He does not recall how he ended up at [facility] but does remember going to the pharmacy to pick up his medications and becoming weak.  He has no complaints today other than severe weakness.    Assessment & Plan:   Principal Problem:   Septic shock with toxic encephalopathy -Evaluated in urgent care where he was noted to be confused and weak, then at [facility] and eventually transferred to [place] note he was admitted for sepsis due to left lower extremity cellulitis back in 2019 and also developed pulmonary edema related to acute diastolic heart failure-at that time he had a Klebsiella UTI treated with meropenem -This current episode of infection suspected to be related to a UTI (per PCCM -UA on 9/16 revealed moderate leukocytes and rare bacteria -  The patient's urine culture reveals less than 10,000 colonies however, it is to be noted that the patient had already received a dose of ceftriaxone at urgent care before this culture was obtained - Infection could also have been coming from his legs (as was the case in the past) which are both quite edematous with chronically thickened and irregular skin -Currently receiving ceftriaxone which I will continue to complete a total of a 1 week course  Active Problems: Acute respiratory failure secondary to acute diastolic heart failure Obstructive sleep apnea -- Follows as outpatient with pulmonary for obstructive sleep apnea and nodule lung disease most prominent in the left upper lobe - He is no longer requiring oxygen - Continue CPAP at bedtime -We will resume Lasix 40 mg daily which is his home dose-follow potassium and replace as needed  Neurogenic bladder/Urinary retention (chronic issue) - hold Ditropan for now - Foley catheter was removed by ICU team - cont I and O caths    Essential hypertension, benign -Metoprolol and losartan were on hold due to septic shock-we will go ahead and resume them as BP is quite elevated today    AF (paroxysmal atrial fibrillation)  -Continue metoprolol and Eliquis CHA2DS2-VASc Score   Glucose intolerance - Hemoglobin A1c 6.0 on 02/03/2021-previously 5.6 --not on diabetic medications at home and therefore likely diet controlled - Sugars are stable and I have discontinued insulin in the hospital  Chronic venous stasis - R LE is more swollen than left but not making him uncomfortable - will hold off on  venous duplex as he is on Eliquis  Morbid obesity Body mass index is 37.76 kg/m.     Time spent in minutes: 35 DVT prophylaxis: Place and maintain sequential compression device Start: 02/03/21 0431 apixaban (ELIQUIS) tablet 5 mg  Code Status: Code Family Communication:  Level of Care: Level of care: Telemetry Medical Disposition Plan:   Status is: Inpatient  Remains inpatient appropriate because:IV treatments appropriate due to intensity of illness or inability to take PO and Inpatient level of care appropriate due to severity of illness  Dispo: The patient is from: Home              Anticipated d/c is to:  To be determined              Patient currently is not medically stable to d/c.   Difficult to place patient Yes      Consultants:  Admitted by pulmonary critical care Procedures:  Intubation/extubation Antimicrobials:  Anti-infectives (From admission, onward)    Start     Dose/Rate Route Frequency Ordered Stop   02/05/21 1800  cefTRIAXone (ROCEPHIN) 1 g in sodium chloride 0.9 % 100 mL IVPB        1 g 200 mL/hr over 30 Minutes Intravenous Every 24 hours 02/05/21 0916 02/07/21 2359   02/03/21 2200  vancomycin (VANCOREADY) IVPB 1750 mg/350 mL  Status:  Discontinued        1,750 mg 175 mL/hr over 120 Minutes Intravenous Every 24 hours 02/02/21 2045 02/03/21 1026   02/03/21 2200  vancomycin (VANCOREADY) IVPB 1250 mg/250 mL  Status:  Discontinued        1,250 mg 166.7 mL/hr over 90 Minutes Intravenous Every 24 hours 02/03/21 1026 02/05/21 0916   02/03/21 1800  ceFEPIme (MAXIPIME) 2 g in sodium chloride 0.9 % 100 mL IVPB  Status:  Discontinued        2 g 200 mL/hr over 30 Minutes Intravenous Every 12 hours 02/03/21 1026 02/05/21 0916   02/03/21 0600  ceFEPIme (MAXIPIME) 2 g in sodium chloride 0.9 % 100 mL IVPB  Status:  Discontinued        2 g 200 mL/hr over 30 Minutes Intravenous Every 8 hours 02/02/21 2045 02/03/21 1026   02/02/21 2100  vancomycin (VANCOREADY) IVPB 2000 mg/400 mL        2,000 mg 200 mL/hr over 120 Minutes Intravenous NOW 02/02/21 2045 02/02/21 2316   02/02/21 2045  ceFEPIme (MAXIPIME) 2 g in sodium chloride 0.9 % 100 mL IVPB        2 g 200 mL/hr over 30 Minutes Intravenous  Once 02/02/21 2030 02/02/21 2114   02/02/21 2045  metroNIDAZOLE (FLAGYL) IVPB 500 mg        500 mg 100 mL/hr over  60 Minutes Intravenous  Once 02/02/21 2030 02/02/21 2323   02/02/21 2045  vancomycin (VANCOCIN) IVPB 1000 mg/200 mL premix  Status:  Discontinued        1,000 mg 200 mL/hr over 60 Minutes Intravenous  Once 02/02/21 2030 02/02/21 2044        Objective: Vitals:   02/06/21 1000 02/06/21 1100 02/06/21 1113 02/06/21 1200  BP:  (!) 156/64    Pulse: 73 73  72  Resp: (!) 31 20  (!) 21  Temp:   99.7 F (37.6 C)   TempSrc:   Oral   SpO2: 90% 97%  95%  Weight:      Height:        Intake/Output Summary (Last 24 hours) at  02/06/2021 1256 Last data filed at 02/06/2021 1200 Gross per 24 hour  Intake 688.19 ml  Output 1350 ml  Net -661.81 ml   Filed Weights   02/02/21 1658 02/03/21 0320  Weight: 121.6 kg 122.8 kg    Examination: General exam: Appears comfortable  HEENT: PERRLA, oral mucosa moist, no sclera icterus or thrush Respiratory system: Clear to auscultation. Respiratory effort normal. Cardiovascular system: S1 & S2 heard, RRR.   Gastrointestinal system: Abdomen soft, non-tender, nondistended. Normal bowel sounds. Central nervous system: Alert and oriented. No focal neurological deficits. Extremities: No cyanosis, clubbing- b/l pitting edema with thickened skin of LE- right leg more edematous than left Skin: No rashes or ulcers Psychiatry:  Mood & affect appropriate.     Data Reviewed: I have personally reviewed following labs and imaging studies  CBC: Recent Labs  Lab 02/02/21 1713 02/04/21 0425 02/05/21 0037 02/06/21 0349  WBC 16.7* 10.3 8.4 7.1  HGB 13.8 13.3 13.1 12.6*  HCT 42.3 41.2 40.3 39.6  MCV 89.8 88.8 90.2 90.6  PLT 162 120* 133* 790   Basic Metabolic Panel: Recent Labs  Lab 02/02/21 1713 02/03/21 0608 02/04/21 0425 02/05/21 0037 02/06/21 0349  NA 136 137 138 140 140  K 4.2 4.5 3.6 3.4* 3.4*  CL 105 103 106 106 110  CO2 23 20* 22 22 20*  GLUCOSE 135* 157* 139* 105* 120*  BUN 22 24* 23 22 20   CREATININE 1.09 1.76* 1.14 1.05 0.91  CALCIUM  9.0 8.4* 8.5* 8.5* 8.3*  MG  --  1.7 1.9 2.3  --   PHOS  --  3.7 2.8 3.1  --    GFR: Estimated Creatinine Clearance: 93.6 mL/min (by C-G formula based on SCr of 0.91 mg/dL). Liver Function Tests: Recent Labs  Lab 02/03/21 0608  AST 50*  ALT 26  ALKPHOS 48  BILITOT 0.9  PROT 5.9*  ALBUMIN 2.6*   No results for input(s): LIPASE, AMYLASE in the last 168 hours. No results for input(s): AMMONIA in the last 168 hours. Coagulation Profile: No results for input(s): INR, PROTIME in the last 168 hours. Cardiac Enzymes: No results for input(s): CKTOTAL, CKMB, CKMBINDEX, TROPONINI in the last 168 hours. BNP (last 3 results) No results for input(s): PROBNP in the last 8760 hours. HbA1C: Recent Labs    02/03/21 2054  HGBA1C 6.0*   CBG: Recent Labs  Lab 02/05/21 0738 02/05/21 1127 02/05/21 1523 02/05/21 2146 02/06/21 0800  GLUCAP 109* 131* 121* 126* 103*   Lipid Profile: No results for input(s): CHOL, HDL, LDLCALC, TRIG, CHOLHDL, LDLDIRECT in the last 72 hours. Thyroid Function Tests: No results for input(s): TSH, T4TOTAL, FREET4, T3FREE, THYROIDAB in the last 72 hours. Anemia Panel: No results for input(s): VITAMINB12, FOLATE, FERRITIN, TIBC, IRON, RETICCTPCT in the last 72 hours. Urine analysis:    Component Value Date/Time   COLORURINE YELLOW 02/02/2021 1843   APPEARANCEUR HAZY (A) 02/02/2021 1843   LABSPEC 1.017 02/02/2021 1843   PHURINE 7.0 02/02/2021 1843   GLUCOSEU NEGATIVE 02/02/2021 1843   HGBUR NEGATIVE 02/02/2021 1843   BILIRUBINUR NEGATIVE 02/02/2021 1843   BILIRUBINUR neg 08/31/2012 1203   KETONESUR NEGATIVE 02/02/2021 1843   PROTEINUR NEGATIVE 02/02/2021 1843   UROBILINOGEN negative 08/31/2012 1203   UROBILINOGEN 0.2 05/31/2010 1125   NITRITE POSITIVE (A) 02/02/2021 1843   LEUKOCYTESUR MODERATE (A) 02/02/2021 1843   Sepsis Labs: @LABRCNTIP (procalcitonin:4,lacticidven:4) ) Recent Results (from the past 240 hour(s))  Covid-19, Flu A+B ([facility])      Status: None  Collection Time: 02/02/21  2:12 PM   Specimen: Nasal Swab; Nasopharyngeal   Naso  Patient immune s  Result Value Ref Range Status   SARS-CoV-2, NAA Not Detected Not Detected Final   Influenza A, NAA Not Detected Not Detected Final   Influenza B, NAA Not Detected Not Detected Final   Test Information: Comment  Final    Comment: This nucleic acid amplification test was developed and its performance characteristics determined by [employer]. Nucleic acid amplification tests include RT-PCR and TMA. This test has not been FDA cleared or approved. This test has been authorized by FDA under an Emergency Use Authorization (EUA). This test is only authorized for the duration of time the declaration that circumstances exist justifying the authorization of the emergency use of in vitro diagnostic tests for detection of SARS-CoV-2 virus and/or diagnosis of COVID-19 infection under section 564(b)(1) of the Act, 21 U.S.C. 314HFW-2(O) (1), unless the authorization is terminated or revoked sooner. When diagnostic testing is negative, the possibility of a false negative result should be considered in the context of a patient's recent exposures and the presence of clinical signs and symptoms consistent with COVID-19. An individual without symptoms of COVID-19 and who is not shedding SARS-CoV-2 virus wo uld expect to have a negative (not detected) result in this assay.   Culture, blood (Routine X 2) w Reflex to ID Panel     Status: None (Preliminary result)   Collection Time: 02/02/21  5:50 PM   Specimen: Right Antecubital; Blood  Result Value Ref Range Status   Specimen Description RIGHT ANTECUBITAL  Final   Special Requests   Final    BOTTLES DRAWN AEROBIC AND ANAEROBIC Blood Culture results may not be optimal due to an excessive volume of blood received in culture bottles   Culture   Final    NO GROWTH 4 DAYS Performed at [facility], [address] [place], [place]  [address]    Report Status PENDING  Incomplete  Urine Culture     Status: Abnormal   Collection Time: 02/02/21  6:43 PM   Specimen: Urine, Clean Catch  Result Value Ref Range Status   Specimen Description   Final    URINE, CLEAN CATCH Performed at [facility], [address] [place], [place] [address]    Special Requests   Final    NONE Performed at [facility], [address], [place], [place] [address]    Culture (A)  Final    <10,000 COLONIES/mL INSIGNIFICANT GROWTH Performed at [facility], [place] [address] [place],  [address]    Report Status 02/04/2021 FINAL  Final  Resp Panel by RT-PCR (Flu A&B, Covid) Nasopharyngeal Swab     Status: None   Collection Time: 02/02/21  6:50 PM   Specimen: Nasopharyngeal Swab; Nasopharyngeal(NP) swabs in vial transport medium  Result Value Ref Range Status   SARS Coronavirus 2 by RT PCR NEGATIVE NEGATIVE Final    Comment: (NOTE) SARS-CoV-2 target nucleic acids are NOT DETECTED.  The SARS-CoV-2 RNA is generally detectable in upper respiratory specimens during the acute phase of infection. The lowest concentration of SARS-CoV-2 viral copies this assay can detect is 138 copies/mL. A negative result does not preclude SARS-Cov-2 infection and should not be used as the sole basis for treatment or other patient management decisions. A negative result may occur with  improper specimen collection/handling, submission of specimen other than nasopharyngeal swab, presence of viral mutation(s) within the areas targeted by this assay, and inadequate  number of viral copies(<138 copies/mL). A negative result must be combined with clinical observations, patient history, and epidemiological information. The expected result is Negative.  Fact Sheet for Patients:  EntrepreneurPulse.com.au  Fact Sheet for Healthcare Providers:  IncredibleEmployment.be  This test is no t yet approved or cleared by the [place] FDA and  has been authorized for detection and/or diagnosis of SARS-CoV-2 by FDA under an Emergency Use Authorization (EUA). This EUA will remain  in effect (meaning this test can be used) for the duration of the COVID-19 declaration under Section 564(b)(1) of the Act, 21 U.S.C.section 360bbb-3(b)(1), unless the authorization is terminated  or revoked sooner.       Influenza A by PCR NEGATIVE NEGATIVE Final   Influenza B by PCR NEGATIVE NEGATIVE Final    Comment: (NOTE) The Xpert Xpress SARS-CoV-2/FLU/RSV plus assay is intended as an aid in the diagnosis of influenza from Nasopharyngeal swab specimens and should not be used as a sole basis for treatment. Nasal washings and aspirates are unacceptable for Xpert Xpress SARS-CoV-2/FLU/RSV testing.  Fact Sheet for Patients: EntrepreneurPulse.com.au  Fact Sheet for Healthcare Providers: IncredibleEmployment.be  This test is not yet approved or cleared by the [place] FDA and has been authorized for detection and/or diagnosis of SARS-CoV-2 by FDA under an Emergency Use Authorization (EUA). This EUA will remain in effect (meaning this test can be used) for the duration of the COVID-19 declaration under Section 564(b)(1) of the Act, 21 U.S.C. section 360bbb-3(b)(1), unless the authorization is terminated or revoked.  Performed at [facility], [address], [place], [place] [address]   Culture, blood (Routine X 2) w Reflex to ID Panel     Status: None (Preliminary result)   Collection Time: 02/02/21  7:25 PM   Specimen: BLOOD LEFT ARM  Result Value Ref Range Status   Specimen Description BLOOD LEFT ARM  Final   Special Requests   Final    BOTTLES DRAWN AEROBIC AND ANAEROBIC Blood Culture adequate volume   Culture   Final    NO GROWTH 4 DAYS Performed at [facility], [address], [place], [place] [address]    Report Status PENDING  Incomplete  MRSA Next Gen by PCR, Nasal      Status: None   Collection Time: 02/03/21  5:35 AM   Specimen: Nasal Mucosa; Nasal Swab  Result Value Ref Range Status   MRSA by PCR Next Gen NOT DETECTED NOT DETECTED Final    Comment: (NOTE) The GeneXpert MRSA Assay (FDA approved for NASAL specimens only), is one component of a comprehensive MRSA colonization surveillance program. It is not intended to diagnose MRSA infection nor to guide or monitor treatment for MRSA infections. Test performance is not FDA approved in patients less than 67 years old. Performed at [facility], [place] [address] [place], [place] [address]          Radiology Studies: DG Chest Port 1 View  Result Date: 02/05/2021 CLINICAL DATA:  Acute respiratory failure. EXAM: PORTABLE CHEST 1 VIEW COMPARISON:  February 04, 2021. FINDINGS: Stable cardiomediastinal silhouette. Endotracheal and nasogastric tubes have been removed. No pneumothorax or pleural effusion is noted. Stable central pulmonary vascular congestion is noted with possible bilateral pulmonary edema. Bony thorax is unremarkable. IMPRESSION: Endotracheal and nasogastric tubes have been removed. Mild central pulmonary vascular congestion is noted with possible bibasilar pulmonary edema. Electronically Signed   By: Marijo Conception M.D.   On: 02/05/2021 08:04      Scheduled  Meds:  apixaban  5 mg Oral BID   Chlorhexidine Gluconate Cloth  6 each Topical Q0600   docusate sodium  100 mg Oral BID   losartan  50 mg Oral Daily   metoprolol tartrate  25 mg Oral BID   oxybutynin  5 mg Oral Daily   polyethylene glycol  17 g Oral Daily   potassium chloride  40 mEq Oral Q4H   rosuvastatin  20 mg Oral Daily   Continuous Infusions:  sodium chloride Stopped (02/05/21 1846)   cefTRIAXone (ROCEPHIN)  IV Stopped (02/05/21 1757)     LOS: 4 days      Debbe Odea, MD [facility] Pager: [email or web address] 02/06/2021, 12:56 PM

## 2021-02-06 NOTE — Progress Notes (Signed)
RT note. Patient cpap set up for the night on auto cpap 20/5. Patient stated he is able to place on self when ready for bed. Told him to let his RN know if he needs help and RT will come help. RT will continue to monitor.

## 2021-02-06 NOTE — Evaluation (Signed)
Physical Therapy Evaluation Patient Details Name: Gary Carroll MRN: [id number] DOB: January 11, 1946 Today's Date: 02/06/2021  History of Present Illness  75 y.o. male admitted to [facility] 9/16 with sepsis, weakness, fatigue, body aches, and LE swelling. Pt with flash pulmonary edema in ED and Intubated. Transfer to [facility] 9/17. Extubated 9/18.  PMH: HTN, HLD, OSA, GERD, HLD, AKI, Afib, and staph infection to Bil LE   Clinical Impression  Pt tolerated treatment well with VSS on RA (see below). Pt in recliner upon arrival and demonstrated urinary urgency and incontinence. Performed sit to stand transfer with mod A to execute and steady. Required min guard when ambulating with verbal cueing for posture. Pt demonstrated partial knee buckling prior to sitting after ambulation. Pt's deficits include decreased activity tolerance, balance, and mobility. Pt would benefit from continued PT to improve deficits. Recommend SNF upon d/c given pt's current functional status and decreased support to provide assist/supervision with mobility. Pt agreeable to recommendation.   Pre-vitals - HR 71, spO2 94% on 2L Taken to RA with spO2 >90% during session Remained on RA at end   Recommendations for follow up therapy are one component of a multi-disciplinary discharge planning process, led by the attending physician.  Recommendations may be updated based on patient status, additional functional criteria and insurance authorization.  Follow Up Recommendations SNF;Supervision for mobility/OOB    Equipment Recommendations  Other (comment) (Pt has equipment at home)    Recommendations for Other Services       Precautions / Restrictions Precautions Precautions: Fall Precaution Comments: Urinary urgency and incontinence Restrictions Weight Bearing Restrictions: No      Mobility  Bed Mobility               General bed mobility comments: In recliner upon arrival    Transfers Overall transfer level: Needs  assistance Equipment used: Rolling walker (2 wheeled) Transfers: Sit to/from Stand Sit to Stand: Mod assist         General transfer comment: Mod A on sit to boost to stand and steady. Performed with increased time. Verbal cues provided for hand placement  Ambulation/Gait Ambulation/Gait assistance: Min guard Gait Distance (Feet): 40 Feet Assistive device: Rolling walker (2 wheeled) Gait Pattern/deviations: Step-through pattern;Trunk flexed;Decreased stride length Gait velocity: decreased Gait velocity interpretation: <1.31 ft/sec, indicative of household ambulator General Gait Details: Increased trunk flexion during gait with min guard for safety. Verbal cues provided to maintain hips in RW and for upright posture. Partial knee buckling intermittently during ambulation  Stairs            Wheelchair Mobility    Modified Rankin (Stroke Patients Only)       Balance Overall balance assessment: Needs assistance         Standing balance support: Bilateral upper extremity supported;During functional activity Standing balance-Leahy Scale: Poor Standing balance comment: Required BUE support on RW during gait. Able to maintain static stand with single UE support                             Pertinent Vitals/Pain Pain Assessment: No/denies pain    Home Living Family/patient expects to be discharged to:: Private residence Living Arrangements: Alone Available Help at Discharge: Neighbor;Available PRN/intermittently Type of Home: Apartment Home Access: Stairs to enter Entrance Stairs-Rails: Right Entrance Stairs-Number of Steps: 4 Home Layout: One level Home Equipment: Walker - 2 wheels;Bedside commode;Cane - single point;Shower seat      Prior Function Level of  Independence: Independent with assistive device(s)         Comments: Used RW in morning but once moving did not use an AD. Now just using cane and trying to get away from using RW.     Hand  Dominance   Dominant Hand: Right    Extremity/Trunk Assessment   Upper Extremity Assessment Upper Extremity Assessment: Defer to OT evaluation    Lower Extremity Assessment Lower Extremity Assessment: Generalized weakness    Cervical / Trunk Assessment Cervical / Trunk Assessment: Normal  Communication   Communication: No difficulties  Cognition Arousal/Alertness: Awake/alert Behavior During Therapy: WFL for tasks assessed/performed Overall Cognitive Status: Within Functional Limits for tasks assessed                                        General Comments General comments (skin integrity, edema, etc.): spO2 > 90% on RA    Exercises     Assessment/Plan    PT Assessment Patient needs continued PT services  PT Problem List Decreased activity tolerance;Decreased balance;Decreased mobility;Decreased strength       PT Treatment Interventions DME instruction;Gait training;Stair training;Functional mobility training;Therapeutic activities;Therapeutic exercise;Balance training    PT Goals (Current goals can be found in the Care Plan section)  Acute Rehab PT Goals Patient Stated Goal: [facility] Goal Formulation: With patient Time For Goal Achievement: 02/20/21 Potential to Achieve Goals: Fair    Frequency Min 3X/week   Barriers to discharge Decreased caregiver support      Co-evaluation               AM-PAC PT "6 Clicks" Mobility  Outcome Measure Help needed turning from your back to your side while in a flat bed without using bedrails?: A Little Help needed moving from lying on your back to sitting on the side of a flat bed without using bedrails?: A Little Help needed moving to and from a bed to a chair (including a wheelchair)?: A Lot Help needed standing up from a chair using your arms (e.g., wheelchair or bedside chair)?: A Lot Help needed to walk in hospital room?: A Little Help needed climbing 3-5 steps with a  railing? : A Lot 6 Click Score: 15    End of Session Equipment Utilized During Treatment: Gait belt Activity Tolerance: Patient tolerated treatment well;Patient limited by fatigue Patient left: in chair;with call bell/phone within reach;with chair alarm set Nurse Communication: Mobility status PT Visit Diagnosis: Other abnormalities of gait and mobility (R26.89);Unsteadiness on feet (R26.81);Muscle weakness (generalized) (M62.81)    Time: 6010-9323 PT Time Calculation (min) (ACUTE ONLY): 23 min   Charges:   PT Evaluation $PT Eval Moderate Complexity: 1 Mod          Kairy Folsom F, SPT Acute Rehab: [phone]    Domingo Dimes 02/06/2021, 1:48 PM

## 2021-02-06 NOTE — Progress Notes (Signed)
Patient transferred to 2W24 via wheelchair He tolerated it well

## 2021-02-06 NOTE — Progress Notes (Signed)
Bladder scan for 503 cc of urine . I&O cath for 500 ccs with blood noted. Dr Wynelle Cleveland informed

## 2021-02-07 DIAGNOSIS — I480 Paroxysmal atrial fibrillation: Secondary | ICD-10-CM | POA: Diagnosis not present

## 2021-02-07 DIAGNOSIS — J810 Acute pulmonary edema: Secondary | ICD-10-CM | POA: Diagnosis not present

## 2021-02-07 DIAGNOSIS — N401 Enlarged prostate with lower urinary tract symptoms: Secondary | ICD-10-CM

## 2021-02-07 DIAGNOSIS — R3914 Feeling of incomplete bladder emptying: Secondary | ICD-10-CM

## 2021-02-07 DIAGNOSIS — I10 Essential (primary) hypertension: Secondary | ICD-10-CM

## 2021-02-07 DIAGNOSIS — L8942 Pressure ulcer of contiguous site of back, buttock and hip, stage 2: Secondary | ICD-10-CM

## 2021-02-07 DIAGNOSIS — A419 Sepsis, unspecified organism: Secondary | ICD-10-CM | POA: Diagnosis not present

## 2021-02-07 DIAGNOSIS — J9601 Acute respiratory failure with hypoxia: Secondary | ICD-10-CM | POA: Diagnosis not present

## 2021-02-07 LAB — CBC
HCT: 39.8 % (ref 39.0–52.0)
Hemoglobin: 12.8 g/dL — ABNORMAL LOW (ref 13.0–17.0)
MCH: 28.8 pg (ref 26.0–34.0)
MCHC: 32.2 g/dL (ref 30.0–36.0)
MCV: 89.6 fL (ref 80.0–100.0)
Platelets: 155 10*3/uL (ref 150–400)
RBC: 4.44 MIL/uL (ref 4.22–5.81)
RDW: 15.1 % (ref 11.5–15.5)
WBC: 10.7 10*3/uL — ABNORMAL HIGH (ref 4.0–10.5)
nRBC: 0 % (ref 0.0–0.2)

## 2021-02-07 LAB — CULTURE, BLOOD (ROUTINE X 2)
Culture: NO GROWTH
Culture: NO GROWTH
Special Requests: ADEQUATE

## 2021-02-07 LAB — BASIC METABOLIC PANEL
Anion gap: 9 (ref 5–15)
BUN: 16 mg/dL (ref 8–23)
CO2: 21 mmol/L — ABNORMAL LOW (ref 22–32)
Calcium: 8.2 mg/dL — ABNORMAL LOW (ref 8.9–10.3)
Chloride: 110 mmol/L (ref 98–111)
Creatinine, Ser: 0.89 mg/dL (ref 0.61–1.24)
GFR, Estimated: >60 mL/min (ref >60)
Glucose, Bld: 122 mg/dL — ABNORMAL HIGH (ref 70–99)
Potassium: 4.4 mmol/L (ref 3.5–5.1)
Sodium: 140 mmol/L (ref 135–145)

## 2021-02-07 MED ORDER — SODIUM CHLORIDE 0.9 % IV SOLN
1.0000 g | INTRAVENOUS | Status: AC
  Administered 2021-02-07: 1 g via INTRAVENOUS
  Filled 2021-02-07: qty 10

## 2021-02-07 NOTE — Evaluation (Signed)
Occupational Therapy Evaluation Patient Details Name: Gary Carroll MRN: [id number] DOB: 12-14-1945 Today's Date: 02/07/2021   History of Present Illness 75 y.o. male admitted to [facility] 9/16 with sepsis, weakness, fatigue, body aches, and LE swelling. Pt with flash pulmonary edema in ED and Intubated. Transfer to [facility] 9/17. Extubated 9/18.  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 Pt will benefit from continued acute OT to address the below listed deficits and maximize independence with basic ADLs prior to d/c to venue below. At baseline, pt lives alone and is up to mod I with ADLs, drives, does his own shopping and meal prep. This session pt needed up to mod A with sit<>stand transfers (impacting toilet transfers and LB ADLs), min guard for bathroom distance mobility, extra time and effort, rw needed.       Recommendations for follow up therapy are one component of a multi-disciplinary discharge planning process, led by the attending physician.  Recommendations may be updated based on patient status, additional functional criteria and insurance authorization.   Follow Up Recommendations  SNF;Supervision/Assistance - 24 hour    Equipment Recommendations  Other (comment) (defer to next venue)    Recommendations for Other Services       Precautions / Restrictions Precautions Precautions: Fall Precaution Comments: Urinary urgency and incontinence Restrictions Weight Bearing Restrictions: No      Mobility Bed Mobility               General bed mobility comments: In recliner upon arrival    Transfers Overall transfer level: Needs assistance Equipment used: Rolling walker (2 wheeled) Transfers: Sit to/from Stand Sit to Stand: Mod assist         General transfer comment: Mod A to boost and control descent. Extra time and effort.     Balance Overall balance assessment: Needs assistance         Standing balance support: Bilateral upper extremity supported;During functional activity Standing balance-Leahy Scale: Poor Standing balance comment: Required BUE support on RW during gait. Able to maintain static stand with single UE support                           ADL either performed or assessed with clinical judgement   ADL Overall ADL's : Needs assistance/impaired Eating/Feeding: Set up;Sitting   Grooming: Set up;Sitting   Upper Body Bathing: Set up;Sitting   Lower Body Bathing: Moderate assistance;Sit to/from stand   Upper Body Dressing : Set up;Sitting   Lower Body Dressing: Moderate assistance;Sit to/from stand   Toilet Transfer: Moderate assistance;Ambulation Toilet Transfer Details (indicate cue type and reason): min guard ambulate, mod A sit<>stand Toileting- Clothing Manipulation and Hygiene: Min guard;Sitting/lateral lean;Moderate assistance;Sit to/from stand       Functional mobility during ADLs: Min guard;Rolling walker General ADL Comments: Pt completed bathroom distance mobility and simulated toilet transfer. Ambulates with min guard assist, extra time and effort, rw. Mod A with sit<>stands.     Vision         Perception     Praxis      Pertinent Vitals/Pain Pain Assessment: No/denies pain     Hand Dominance Right   Extremity/Trunk Assessment Upper Extremity Assessment Upper Extremity Assessment: Generalized weakness   Lower Extremity Assessment Lower Extremity Assessment: Defer to PT evaluation   Cervical / Trunk Assessment Cervical /  Trunk Assessment: Normal   Communication Communication Communication: No difficulties   Cognition Arousal/Alertness: Awake/alert Behavior During Therapy: WFL for tasks assessed/performed Overall Cognitive Status: Within Functional Limits for tasks assessed                                     General Comments        Exercises     Shoulder Instructions      Home Living Family/patient expects to be discharged to:: Private residence Living Arrangements: Alone Available Help at Discharge: Neighbor;Available PRN/intermittently Type of Home: Apartment Home Access: Stairs to enter Entrance Stairs-Number of Steps: 4 Entrance Stairs-Rails: Right Home Layout: One level     Bathroom Shower/Tub: Teacher, early years/pre: Standard Bathroom Accessibility: Yes   Home Equipment: Environmental consultant - 2 wheels;Bedside commode;Cane - single point;Shower seat   Additional Comments: often showers after working out at the [employer] more accessible      Prior Functioning/Environment Level of Independence: Independent with assistive device(s)        Comments: Used RW in morning but once moving did not use an AD. Now just using cane and trying to get away from using RW.        OT Problem List:        OT Treatment/Interventions: Self-care/ADL training;Therapeutic exercise;Energy conservation;DME and/or AE instruction;Therapeutic activities;Patient/family education;Balance training    OT Goals(Current goals can be found in the care plan section) Acute Rehab OT Goals Patient Stated Goal: [facility] Goal Formulation: With patient Time For Goal Achievement: 02/21/21 Potential to Achieve Goals: Good ADL Goals Pt Will Perform Grooming: with modified independence;standing Pt Will Perform Upper Body Dressing: with modified independence;sitting Pt Will Perform Lower Body Dressing: with modified independence;sit to/from stand Pt Will Transfer to Toilet: with modified independence;ambulating Pt Will Perform Toileting - Clothing Manipulation and hygiene: with modified independence;sitting/lateral leans;sit to/from stand Pt Will Perform Tub/Shower Transfer: Tub transfer;with min guard assist;ambulating;shower seat;rolling walker  OT Frequency: Min 2X/week   Barriers to D/C:  Decreased caregiver support;Inaccessible home environment  lives alone, stairs       Co-evaluation              AM-PAC OT "6 Clicks" Daily Activity     Outcome Measure Help from another person eating meals?: None Help from another person taking care of personal grooming?: A Little Help from another person toileting, which includes using toliet, bedpan, or urinal?: A Lot Help from another person bathing (including washing, rinsing, drying)?: A Lot Help from another person to put on and taking off regular upper body clothing?: A Little Help from another person to put on and taking off regular lower body clothing?: A Lot 6 Click Score: 16   End of Session Equipment Utilized During Treatment: Rolling walker  Activity Tolerance: Patient tolerated treatment well;Patient limited by fatigue Patient left: in chair;with call bell/phone within reach  OT Visit Diagnosis: Unsteadiness on feet (R26.81);Muscle weakness (generalized) (M62.81)                Time: 9518-8416 OT Time Calculation (min): 16 min Charges:  OT General Charges $OT Visit: 1 Visit OT Evaluation $OT Eval Low Complexity: [place], OT Acute Rehabilitation Services Pager: [phone] Office: [phone]   Hortencia Pilar 02/07/2021, 2:07 PM

## 2021-02-07 NOTE — NC FL2 (Deleted)
[place] LEVEL OF CARE SCREENING TOOL     IDENTIFICATION  Patient Name: Gary Carroll Birthdate: [birth year] Sex: male Admission Date (Current Location): 02/02/2021  [facility] and [place] Number:  Herbalist and Address:  [facility], [place] [address], [place], [place] [address]      Provider Number: [id number]  Attending Physician Name and Address:  Tawni Millers,*  Relative Name and Phone Number:  Valrie Hart [phone]    Current Level of Care: Hospital Recommended Level of Care: [place] Prior Approval Number:    Date Approved/Denied:   PASRR Number:    Discharge Plan: SNF    Current Diagnoses: Patient Active Problem List   Diagnosis Date Noted   Pressure injury of skin 02/03/2021   Septic shock ([place])    Acute respiratory failure with hypoxia and hypercapnia (HCC)    Acute pulmonary edema (HCC)    Abnormal CT of the chest 10/23/2020   History of tobacco use 10/23/2020   Chronic knee pain 03/19/2018   Chronic postoperative pain 03/19/2018   Elevated transaminase level    Sleep apnea 10/01/2017   BPH (benign prostatic hyperplasia) 02/04/2017   Urinary tract infection 02/04/2017   Cellulitis of left lower extremity 02/03/2017   GERD (gastroesophageal reflux disease) 02/03/2017   AF (paroxysmal atrial fibrillation) ([place]) 02/03/2017   Sepsis due to cellulitis ([place]) 02/03/2017   New onset atrial fibrillation ([place]) 08/24/2015   Diuresis excessive 08/22/2015   Bladder outlet obstruction 08/22/2015   Hydronephrosis determined by ultrasound 08/22/2015   AKI (acute kidney injury) ([place]) 08/22/2015   Hypernatremia 08/22/2015   Acute blood loss anemia 08/22/2015   Hematuria 08/22/2015   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   Peripheral edema 08/31/2012   Urinary frequency 08/31/2012   Diverticulosis 03/23/2012   Impaired fasting glucose 02/06/2012   Decreased libido 03/07/2011   Erectile  dysfunction 03/07/2011   Essential hypertension, benign 12/20/2010   Mixed hyperlipidemia 12/20/2010   ALLERGIC RHINITIS, SEASONAL 05/02/2010   SCIATICA 05/02/2010   HIATAL HERNIA, HX OF 05/02/2010    Orientation RESPIRATION BLADDER Height & Weight     Self, Time, Situation, Place  Normal Continent Weight: 122.8 kg Height:  5\' 11"  (180.3 cm)  BEHAVIORAL SYMPTOMS/MOOD NEUROLOGICAL BOWEL NUTRITION STATUS   (n/a)  (n/a) Continent Diet  AMBULATORY STATUS COMMUNICATION OF NEEDS Skin   Supervision (Rolling walker) Verbally Other (Comment) (MASD to buttocks foam dressing- left leg skin tear foam dressing)   PU Stage 2 Dressing:  (foam dressing as needed)                   Personal Care Assistance Level of Assistance  Bathing, Feeding, Dressing, Total care Bathing Assistance: Limited assistance Feeding assistance: Independent Dressing Assistance: Limited assistance Total Care Assistance: Limited assistance   Functional Limitations Info  Sight, Hearing, Speech Sight Info: Impaired (wears glasses) Hearing Info: Adequate Speech Info: Adequate    SPECIAL CARE FACTORS FREQUENCY  PT (By licensed PT), OT (By licensed OT)     PT Frequency: 5X OT Frequency: 5X            Contractures      Additional Factors Info  Code Status, Allergies, Psychotropic, Isolation Precautions, Suctioning Needs Code Status Info: Full Allergies Info: Diovan (Valsartan), Tagamet (Cimetidine) Psychotropic Info: see d/c summary for psychotropic info   Isolation Precautions Info: n/a Suctioning Needs: n/a   Current Medications (02/07/2021):  This is the current hospital active medication list  Current Facility-Administered Medications  Medication Dose Route Frequency Provider Last Rate Last Admin   0.9 %  sodium chloride infusion  250 mL Intravenous Continuous Collier Bullock, MD   Stopped at 02/05/21 1846   acetaminophen (TYLENOL) tablet 650 mg  650 mg Oral Q6H PRN Hunsucker, Bonna Gains, MD   650  mg at 02/07/21 1503   albuterol (PROVENTIL) (2.5 MG/3ML) 0.083% nebulizer solution 2.5 mg  2.5 mg Nebulization Q6H PRN Estill Cotta, NP       apixaban Arne Cleveland) tablet 5 mg  5 mg Oral BID Hunsucker, Bonna Gains, MD   5 mg at 02/07/21 1133   cefTRIAXone (ROCEPHIN) 1 g in sodium chloride 0.9 % 100 mL IVPB  1 g Intravenous Q24H Arrien, Jimmy Picket, MD       Chlorhexidine Gluconate Cloth 2 % PADS 6 each  6 each Topical Q0600 Collier Bullock, MD   6 each at 02/07/21 0900   docusate sodium (COLACE) capsule 100 mg  100 mg Oral BID Mannam, Praveen, MD   100 mg at 02/04/21 2151   furosemide (LASIX) tablet 40 mg  40 mg Oral Daily Debbe Odea, MD   40 mg at 02/07/21 1133   losartan (COZAAR) tablet 50 mg  50 mg Oral Daily Debbe Odea, MD   50 mg at 02/07/21 1133   metoprolol tartrate (LOPRESSOR) tablet 25 mg  25 mg Oral BID Debbe Odea, MD   25 mg at 02/07/21 1133   polyethylene glycol (MIRALAX / GLYCOLAX) packet 17 g  17 g Oral Daily Mannam, Praveen, MD   17 g at 02/04/21 1627   rosuvastatin (CRESTOR) tablet 20 mg  20 mg Oral Daily Hunsucker, Bonna Gains, MD   20 mg at 02/07/21 1135     Discharge Medications: Please see discharge summary for a list of discharge medications.  Relevant Imaging Results:  Relevant Lab Results:   Additional Information SS# [id number]  moderna booster 01/22/21 moderna sars covid-2 vaccination 03/13/20, 06/22/19, 06/11/20  Angelita Ingles, RN

## 2021-02-07 NOTE — TOC Initial Note (Addendum)
Transition of Care [facility]) - Initial/Assessment Note    Patient Details  Name: Gary Carroll MRN: [id number] Date of Birth: Oct 30, 1945  Transition of Care [facility]) CM/SW Contact:    Angelita Ingles, RN Phone Number:[phone]  02/07/2021, 2:27 PM  Clinical Narrative:                 [facility] consulted for patient with disposition needs. Patient is from home alone where he lives in his apartment and has a walker for [employer] assistance. Patient states that he does have transportation to appointments, access to medications and PCP. Patient states that he has no other DME needs at this time. CM discussed disposition plan with patient. Patient is agreeable with short term rehab. Choices provided for the patient. CM updated patient to inform him that [facility]. CM has called April [phone] ext [phone]) who is the coordinator for the [place]. Message has been left in order for CM to make contact with the patients SW at the [place] and to proceed with bed search. TOC will continue to follow.   Ashby has been left for [place] SW at [place]. Will await return call.  Expected Discharge Plan: IP Rehab Facility Barriers to Discharge: Continued Medical Work up   Patient Goals and CMS Choice Patient states their goals for this hospitalization and ongoing recovery are:: Wants to get rehab in order to get stronger to go home CMS Medicare.gov Compare Post Acute Care list provided to:: Patient Choice offered to / list presented to : Patient  Expected Discharge Plan and Services Expected Discharge Plan: [place] In-house Referral: NA Discharge Planning Services: CM Consult Post Acute Care Choice: NA Living arrangements for the past 2 months: Apartment                 DME Arranged: N/A DME Agency: NA       HH Arranged: NA HH Agency: NA        Prior Living Arrangements/Services Living arrangements for the past 2 months:  Apartment Lives with:: Self Patient language and need for interpreter reviewed:: Yes Do you feel safe going back to the place where you live?: Yes      Need for Family Participation in Patient Care: Yes (Comment) Care giver support system in place?: Yes (comment) Current home services:  (none) Criminal Activity/Legal Involvement Pertinent to Current Situation/Hospitalization: No - Comment as needed  Activities of Daily Living Home Assistive Devices/Equipment: Cane (specify quad or straight), Walker (specify type) ADL Screening (condition at time of admission) Patient's cognitive ability adequate to safely complete daily activities?: Yes Is the patient deaf or have difficulty hearing?: No Does the patient have difficulty seeing, even when wearing glasses/contacts?: No Does the patient have difficulty concentrating, remembering, or making decisions?: No Patient able to express need for assistance with ADLs?: Yes Does the patient have difficulty dressing or bathing?: Yes Independently performs ADLs?: No Communication: Independent Dressing (OT): Needs assistance Is this a change from baseline?: Change from baseline, expected to last >3 days Grooming: Independent Feeding: Independent Bathing: Needs assistance Is this a change from baseline?: Change from baseline, expected to last >3 days Toileting: Needs assistance Is this a change from baseline?: Change from baseline, expected to last >3days In/Out Bed: Needs assistance Is this a change from baseline?: Change from baseline, expected to last >3 days Walks in Home: Needs assistance Is this a change from baseline?: Change from baseline, expected  to last >3 days Does the patient have difficulty walking or climbing stairs?: Yes Weakness of Legs: Both Weakness of Arms/Hands: None  Permission Sought/Granted   Permission granted to share information with : No              Emotional Assessment Appearance:: Appears stated  age Attitude/Demeanor/Rapport: Gracious, Engaged Affect (typically observed): Accepting, Pleasant Orientation: : Oriented to Self, Oriented to Place, Oriented to  Time, Oriented to Situation Alcohol / Substance Use: Not Applicable Psych Involvement: No (comment)  Admission diagnosis:  Acute pulmonary edema (HCC) [J81.0] SOB (shortness of breath) [R06.02] Acute respiratory failure with hypoxia (HCC) [J96.01] Sepsis ([place]) [A41.9] Sepsis, due to unspecified organism, unspecified whether acute organ dysfunction present [facility]) [A41.9] Patient Active Problem List   Diagnosis Date Noted   Pressure injury of skin 02/03/2021   Septic shock ([place])    Acute respiratory failure with hypoxia and hypercapnia (HCC)    Acute pulmonary edema (HCC)    Abnormal CT of the chest 10/23/2020   History of tobacco use 10/23/2020   Chronic knee pain 03/19/2018   Chronic postoperative pain 03/19/2018   Elevated transaminase level    Sleep apnea 10/01/2017   BPH (benign prostatic hyperplasia) 02/04/2017   Urinary tract infection 02/04/2017   Cellulitis of left lower extremity 02/03/2017   GERD (gastroesophageal reflux disease) 02/03/2017   AF (paroxysmal atrial fibrillation) ([place]) 02/03/2017   Sepsis due to cellulitis ([place]) 02/03/2017   New onset atrial fibrillation ([place]) 08/24/2015   Diuresis excessive 08/22/2015   Bladder outlet obstruction 08/22/2015   Hydronephrosis determined by ultrasound 08/22/2015   AKI (acute kidney injury) ([place]) 08/22/2015   Hypernatremia 08/22/2015   Acute blood loss anemia 08/22/2015   Hematuria 08/22/2015   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 [place], [place] [address] Phone: [phone] Fax: [phone]  [id number] [place], [name] [place] [address] Phone: [phone] Fax: [phone]     Social Determinants of Health (SDOH) Interventions    Readmission Risk Interventions Readmission Risk Prevention Plan 02/07/2021  Transportation Screening Complete  PCP or Specialist Appt within 5-7 Days Complete  Home Care Screening Complete  Medication Review (RN CM) Referral to Pharmacy  Some recent data might be hidden

## 2021-02-07 NOTE — Progress Notes (Signed)
PROGRESS NOTE    Tarl Cephas Cavagnaro  YWV:[id number] DOB: [birth year] DOA: 02/02/2021 PCP: Celene Squibb, MD    Brief Narrative:  Mr. Padget was admitted to the hospital with the working diagnosis of septic shock due to urine infection, complicated with respiratory failure due to acute pulmonary edema.   75 year old male past medical history of hypertension, GERD, dyslipidemia, atrial fibrillation, and obesity class II who presented with fatigue, body aches, lower extremity edema and erythema.  Initially diagnosed with lower extremity cellulitis, received doxycycline and ceftriaxone and discharged home.  He was brought back to the emergency department by EMS due to altered mentation.  Apparently he was not in the pharmacy getting his prescriptions when EMS was called.  On his initial physical examination he was febrile 102.1 F, blood pressure 80/60, heart rate 81, respiratory rate 28, oxygen saturation 97%.  His lungs were clear to auscultation, heart S1-S2, present, tachycardic, abdomen was soft, nontender, his lower extremities were cool, positive edema and bilateral erythema.  Sodium 136, potassium 4.2, chloride 105, bicarb 23, glucose 135, BUN 22, creatinine 1.0, lactic acid 3.0-2.6, white count 16.7, hemoglobin 13.8, hematocrit 42.3, platelets 162. SARS COVID-19 negative.  Urinalysis, specific gravity 1.017, 6-10 red cells,> 50 white cells.  His chest radiograph had no infiltrates.  EKG 90 bpm, normal axis, normal intervals, sinus rhythm, V4-V6 ST depressions, no significant T wave changes.  Patient received aggressive volume resuscitation and broad-spectrum antibiotic therapy. Required vasopressors due to persistent shock. He developed pulmonary edema and required intubation with mechanical ventilation.  9/17 transfer from [facility] to [facility] for further management.  9/18 vasopressors were discontinued and patient was liberated from mechanical ventilation.  Transferred to [facility]  9/20.  Assessment & Plan:   Principal Problem:   Septic shock ([place]) Active Problems:   Essential hypertension, benign   Erectile dysfunction   AF (paroxysmal atrial fibrillation) (HCC)   Sepsis due to cellulitis (HCC)   BPH (benign prostatic hyperplasia)   Sleep apnea   Acute respiratory failure with hypoxia and hypercapnia (HCC)   Acute pulmonary edema (HCC)   Pressure injury of skin   Septic shock due to urine infection, in the setting of chronic self catheterization/ urinary retention.  Patient continue to be very weak and deconditioned, not yet back to his baseline.  Wbc is 10,7 and cultures remain with no significant growth.   Plan to continue antibiotic therapy with ceftriaxone #3/5.  CT abdomen and pelvis from 09.16.22 with no hydronephrosis, prostate enlargement.   2. Acute hypoxemic respiratory failure due to acute diastolic heart failure, volume overload.  Positive left apical nodular infiltrate, positive enlarged mediastinal, bilateral hilar, retroperitoneal, and mesenteric lymph nodes.  Oxymetry is 94% on room air.  Continue to have lower extremity edema.   Plan to continue diuresis with furosemide 40 mg daily.  Blood pressure control with metoprolol and losartan.   3. Paroxysmal atrial fibrillation. Continue rate control with metoprolol and anticoagulation with apixaban.   4. Hypokalemia. Stable renal function with serum cr at 0,89, K is 4,4 and bicarbonate at 21. Na 140. Continue diuresis with oral furosemide  5. Dyslipidemia. Continue with statin therapy.   6. Peripheral vascular disease. Chronic lower extremity discoloration.   6. Obesity class 2. Stage 2 sacrum pressure ulcer present on admission.  Calculated BMI is 37.7.  Continue local skin care.    Status is: Inpatient  Remains inpatient appropriate because:Inpatient level of care appropriate due to severity of illness  Dispo: The patient is from: Home  Anticipated d/c is to: SNF               Patient currently is not medically stable to d/c.   Difficult to place patient No  DVT prophylaxis: Apixaban   Code Status:    full  Family Communication:   No family at the bedside      Skin Documentation: Pressure Injury 02/03/21 Sacrum Lower;Medial Stage 2 -  Partial thickness loss of dermis presenting as a shallow open injury with a red, pink wound bed without slough. (Active)  02/03/21 0330  Location: Sacrum  Location Orientation: Lower;Medial  Staging: Stage 2 -  Partial thickness loss of dermis presenting as a shallow open injury with a red, pink wound bed without slough.  Wound Description (Comments):   Present on Admission: Yes    Antimicrobials:  Ceftriaxone     Subjective: Patient with no nausea or vomiting, no chest pain or dyspnea, continue to have lower extremity edema. Very weak and deconditioned, not yet back to his baseline.   Objective: Vitals:   02/06/21 1600 02/06/21 1725 02/06/21 2111 02/07/21 0640  BP:  (!) 183/78 (!) 142/71 (!) 144/77  Pulse: 82 86    Resp: (!) 22 (!) 22 20 20   Temp:  97.6 F (36.4 C) 98.9 F (37.2 C) 98.4 F (36.9 C)  TempSrc:  Oral Oral Oral  SpO2: 95% 97% 96% 94%  Weight:      Height:        Intake/Output Summary (Last 24 hours) at 02/07/2021 1121 Last data filed at 02/07/2021 0600 Gross per 24 hour  Intake 340 ml  Output 2975 ml  Net -2635 ml   Filed Weights   02/02/21 1658 02/03/21 0320  Weight: 121.6 kg 122.8 kg    Examination:   General: Not in pain or dyspnea, deconditioned  Neurology: Awake and alert, non focal  E ENT: mild pallor, no icterus, oral mucosa moist Cardiovascular: No JVD. S1-S2 present, rhythmic, no gallops, rubs, or murmurs. +++ non pitting lower extremity edema. Pulmonary: positive breath sounds bilaterally, with no wheezing, or rhonchi, positive bilateral bibasilar rales. Gastrointestinal. Abdomen soft and non tender Skin. Discoloration lower extremities.  Musculoskeletal: no joint  deformities     Data Reviewed: I have personally reviewed following labs and imaging studies  CBC: Recent Labs  Lab 02/02/21 1713 02/04/21 0425 02/05/21 0037 02/06/21 0349 02/07/21 0244  WBC 16.7* 10.3 8.4 7.1 10.7*  HGB 13.8 13.3 13.1 12.6* 12.8*  HCT 42.3 41.2 40.3 39.6 39.8  MCV 89.8 88.8 90.2 90.6 89.6  PLT 162 120* 133* 155 413   Basic Metabolic Panel: Recent Labs  Lab 02/03/21 0608 02/04/21 0425 02/05/21 0037 02/06/21 0349 02/07/21 0244  NA 137 138 140 140 140  K 4.5 3.6 3.4* 3.4* 4.4  CL 103 106 106 110 110  CO2 20* 22 22 20* 21*  GLUCOSE 157* 139* 105* 120* 122*  BUN 24* 23 22 20 16   CREATININE 1.76* 1.14 1.05 0.91 0.89  CALCIUM 8.4* 8.5* 8.5* 8.3* 8.2*  MG 1.7 1.9 2.3  --   --   PHOS 3.7 2.8 3.1  --   --    GFR: Estimated Creatinine Clearance: 95.7 mL/min (by C-G formula based on SCr of 0.89 mg/dL). Liver Function Tests: Recent Labs  Lab 02/03/21 0608  AST 50*  ALT 26  ALKPHOS 48  BILITOT 0.9  PROT 5.9*  ALBUMIN 2.6*   No results for input(s): LIPASE, AMYLASE in the last 168 hours. No results for  input(s): AMMONIA in the last 168 hours. Coagulation Profile: No results for input(s): INR, PROTIME in the last 168 hours. Cardiac Enzymes: No results for input(s): CKTOTAL, CKMB, CKMBINDEX, TROPONINI in the last 168 hours. BNP (last 3 results) No results for input(s): PROBNP in the last 8760 hours. HbA1C: No results for input(s): HGBA1C in the last 72 hours. CBG: Recent Labs  Lab 02/05/21 0738 02/05/21 1127 02/05/21 1523 02/05/21 2146 02/06/21 0800  GLUCAP 109* 131* 121* 126* 103*   Lipid Profile: No results for input(s): CHOL, HDL, LDLCALC, TRIG, CHOLHDL, LDLDIRECT in the last 72 hours. Thyroid Function Tests: No results for input(s): TSH, T4TOTAL, FREET4, T3FREE, THYROIDAB in the last 72 hours. Anemia Panel: No results for input(s): VITAMINB12, FOLATE, FERRITIN, TIBC, IRON, RETICCTPCT in the last 72 hours.    Radiology Studies: I  have reviewed all of the imaging during this hospital visit personally     Scheduled Meds:  apixaban  5 mg Oral BID   Chlorhexidine Gluconate Cloth  6 each Topical Q0600   docusate sodium  100 mg Oral BID   furosemide  40 mg Oral Daily   losartan  50 mg Oral Daily   metoprolol tartrate  25 mg Oral BID   polyethylene glycol  17 g Oral Daily   rosuvastatin  20 mg Oral Daily   Continuous Infusions:  sodium chloride Stopped (02/05/21 1846)   cefTRIAXone (ROCEPHIN)  IV 1 g (02/06/21 1631)     LOS: 5 days        Pippa Hanif Gerome Apley, MD

## 2021-02-07 NOTE — NC FL2 (Addendum)
[place] LEVEL OF CARE SCREENING TOOL     IDENTIFICATION  Patient Name: Gary Carroll Birthdate: Sep 18, 1945 Sex: male Admission Date (Current Location): 02/02/2021  [facility] and [place] Number:  Herbalist and Address:  [facility]. [facility], [place] [address] [place], [place] [address]      Provider Number: [id number]  Attending Physician Name and Address:  Tawni Millers,*  Relative Name and Phone Number:  Valrie Hart [phone]    Current Level of Care: Hospital Recommended Level of Care: [place] Prior Approval Number:    Date Approved/Denied:   PASRR Number:  [id number]  Discharge Plan: SNF    Current Diagnoses: Patient Active Problem List   Diagnosis Date Noted   Pressure injury of skin 02/03/2021   Septic shock ([place])    Acute respiratory failure with hypoxia and hypercapnia (HCC)    Acute pulmonary edema (HCC)    Abnormal CT of the chest 10/23/2020   History of tobacco use 10/23/2020   Chronic knee pain 03/19/2018   Chronic postoperative pain 03/19/2018   Elevated transaminase level    Sleep apnea 10/01/2017   BPH (benign prostatic hyperplasia) 02/04/2017   Urinary tract infection 02/04/2017   Cellulitis of left lower extremity 02/03/2017   GERD (gastroesophageal reflux disease) 02/03/2017   AF (paroxysmal atrial fibrillation) ([place]) 02/03/2017   Sepsis due to cellulitis (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   Peripheral edema 08/31/2012   Urinary frequency 08/31/2012   Diverticulosis 03/23/2012   Impaired fasting glucose 02/06/2012   Decreased libido 03/07/2011    Erectile dysfunction 03/07/2011   Essential hypertension, benign 12/20/2010   Mixed hyperlipidemia 12/20/2010   ALLERGIC RHINITIS, SEASONAL 05/02/2010   SCIATICA 05/02/2010   HIATAL HERNIA, HX OF 05/02/2010    Orientation RESPIRATION BLADDER Height & Weight     Self, Time, Situation, Place  Normal Continent Weight: 122.8 kg Height:  5\' 11"  (180.3 cm)  BEHAVIORAL SYMPTOMS/MOOD NEUROLOGICAL BOWEL NUTRITION STATUS   (n/a)  (n/a) Continent Diet  AMBULATORY STATUS COMMUNICATION OF NEEDS Skin   Supervision (Rolling walker) Verbally Other (Comment) (MASD to buttocks foam dressing- left leg skin tear foam dressing)   PU Stage 2 Dressing:  (foam dressing as needed)                   Personal Care Assistance Level of Assistance  Bathing, Feeding, Dressing, Total care Bathing Assistance: Limited assistance Feeding assistance: Independent Dressing Assistance: Limited assistance Total Care Assistance: Limited assistance   Functional Limitations Info  Sight, Hearing, Speech Sight Info: Impaired (wears glasses) Hearing Info: Adequate Speech Info: Adequate    SPECIAL CARE FACTORS FREQUENCY  PT (By licensed PT), OT (By licensed OT)     PT Frequency: 5X OT Frequency: 5X            Contractures      Additional Factors Info  Insulin Sliding Scale Code Status Info: Full Allergies Info: Diovan (Valsartan), Tagamet (Cimetidine) Psychotropic Info: see d/c summary for psychotropic info Insulin Sliding Scale Info: see d/c summary for sliding scale info Isolation Precautions Info: n/a Suctioning Needs: n/a   Current Medications (02/07/2021):  This is the current  hospital active medication list Current Facility-Administered Medications  Medication Dose Route Frequency Provider Last Rate Last Admin   0.9 %  sodium chloride infusion  250 mL Intravenous Continuous Collier Bullock, MD   Stopped at 02/05/21 1846   acetaminophen (TYLENOL) tablet 650 mg  650 mg Oral Q6H PRN Hunsucker,  Bonna Gains, MD   650 mg at 02/07/21 1503   albuterol (PROVENTIL) (2.5 MG/3ML) 0.083% nebulizer solution 2.5 mg  2.5 mg Nebulization Q6H PRN Estill Cotta, NP       apixaban Arne [place]) tablet 5 mg  5 mg Oral BID Hunsucker, Bonna Gains, MD   5 mg at 02/07/21 1133   cefTRIAXone (ROCEPHIN) 1 g in sodium chloride 0.9 % 100 mL IVPB  1 g Intravenous Q24H Arrien, Jimmy Picket, MD       Chlorhexidine Gluconate Cloth 2 % PADS 6 each  6 each Topical Q0600 Collier Bullock, MD   6 each at 02/07/21 0900   docusate sodium (COLACE) capsule 100 mg  100 mg Oral BID Mannam, Praveen, MD   100 mg at 02/04/21 2151   furosemide (LASIX) tablet 40 mg  40 mg Oral Daily Debbe Odea, MD   40 mg at 02/07/21 1133   losartan (COZAAR) tablet 50 mg  50 mg Oral Daily Debbe Odea, MD   50 mg at 02/07/21 1133   metoprolol tartrate (LOPRESSOR) tablet 25 mg  25 mg Oral BID Debbe Odea, MD   25 mg at 02/07/21 1133   polyethylene glycol (MIRALAX / GLYCOLAX) packet 17 g  17 g Oral Daily Mannam, Praveen, MD   17 g at 02/04/21 1627   rosuvastatin (CRESTOR) tablet 20 mg  20 mg Oral Daily Hunsucker, Bonna Gains, MD   20 mg at 02/07/21 1135     Discharge Medications: Please see discharge summary for a list of discharge medications.  Relevant Imaging Results:  Relevant Lab Results:   Additional Information SS# [id number]  moderna booster 01/22/21 moderna sars covid-2 vaccination 03/13/20, 06/22/19, 06/11/20  Angelita Ingles, RN

## 2021-02-08 DIAGNOSIS — A419 Sepsis, unspecified organism: Secondary | ICD-10-CM | POA: Diagnosis not present

## 2021-02-08 DIAGNOSIS — I480 Paroxysmal atrial fibrillation: Secondary | ICD-10-CM | POA: Diagnosis not present

## 2021-02-08 DIAGNOSIS — J9601 Acute respiratory failure with hypoxia: Secondary | ICD-10-CM | POA: Diagnosis not present

## 2021-02-08 DIAGNOSIS — N401 Enlarged prostate with lower urinary tract symptoms: Secondary | ICD-10-CM | POA: Diagnosis not present

## 2021-02-08 DIAGNOSIS — L0390 Cellulitis, unspecified: Secondary | ICD-10-CM

## 2021-02-08 DIAGNOSIS — G4730 Sleep apnea, unspecified: Secondary | ICD-10-CM

## 2021-02-08 LAB — CBC
HCT: 40.6 % (ref 39.0–52.0)
Hemoglobin: 13.1 g/dL (ref 13.0–17.0)
MCH: 28.6 pg (ref 26.0–34.0)
MCHC: 32.3 g/dL (ref 30.0–36.0)
MCV: 88.6 fL (ref 80.0–100.0)
Platelets: 189 10*3/uL (ref 150–400)
RBC: 4.58 MIL/uL (ref 4.22–5.81)
RDW: 15.2 % (ref 11.5–15.5)
WBC: 10.2 10*3/uL (ref 4.0–10.5)
nRBC: 0 % (ref 0.0–0.2)

## 2021-02-08 LAB — BASIC METABOLIC PANEL
Anion gap: 10 (ref 5–15)
BUN: 19 mg/dL (ref 8–23)
CO2: 20 mmol/L — ABNORMAL LOW (ref 22–32)
Calcium: 8.4 mg/dL — ABNORMAL LOW (ref 8.9–10.3)
Chloride: 108 mmol/L (ref 98–111)
Creatinine, Ser: 0.83 mg/dL (ref 0.61–1.24)
GFR, Estimated: >60 mL/min (ref >60)
Glucose, Bld: 123 mg/dL — ABNORMAL HIGH (ref 70–99)
Potassium: 3.7 mmol/L (ref 3.5–5.1)
Sodium: 138 mmol/L (ref 135–145)

## 2021-02-08 LAB — TROPONIN I (HIGH SENSITIVITY)
Troponin I (High Sensitivity): 315 ng/L (ref <18)
Troponin I (High Sensitivity): 292 ng/L (ref <18)

## 2021-02-08 LAB — TSH: TSH: 1.443 u[IU]/mL (ref 0.350–4.500)

## 2021-02-08 LAB — MAGNESIUM: Magnesium: 2.2 mg/dL (ref 1.7–2.4)

## 2021-02-08 MED ORDER — DILTIAZEM LOAD VIA INFUSION
15.0000 mg | Freq: Once | INTRAVENOUS | Status: AC
  Administered 2021-02-08: 15 mg via INTRAVENOUS
  Filled 2021-02-08: qty 15

## 2021-02-08 MED ORDER — METOPROLOL TARTRATE 5 MG/5ML IV SOLN
2.5000 mg | Freq: Once | INTRAVENOUS | Status: AC
  Administered 2021-02-08: 2.5 mg via INTRAVENOUS
  Filled 2021-02-08: qty 5

## 2021-02-08 MED ORDER — METOPROLOL TARTRATE 50 MG PO TABS
50.0000 mg | ORAL_TABLET | Freq: Two times a day (BID) | ORAL | Status: DC
  Administered 2021-02-08 – 2021-02-13 (×10): 50 mg via ORAL
  Filled 2021-02-08 (×10): qty 1

## 2021-02-08 MED ORDER — METOPROLOL TARTRATE 50 MG PO TABS: 50.0000 mg | ORAL_TABLET | Freq: Once | ORAL | Status: DC

## 2021-02-08 MED ORDER — POTASSIUM CHLORIDE CRYS ER 20 MEQ PO TBCR
40.0000 meq | EXTENDED_RELEASE_TABLET | Freq: Once | ORAL | Status: AC
  Administered 2021-02-08: 40 meq via ORAL
  Filled 2021-02-08: qty 2

## 2021-02-08 MED ORDER — METOPROLOL TARTRATE 25 MG PO TABS
25.0000 mg | ORAL_TABLET | Freq: Once | ORAL | Status: DC
  Filled 2021-02-08: qty 1

## 2021-02-08 MED ORDER — METOPROLOL TARTRATE 50 MG PO TABS: 50.0000 mg | ORAL_TABLET | Freq: Two times a day (BID) | ORAL | Status: DC

## 2021-02-08 MED ORDER — DILTIAZEM HCL-DEXTROSE 125-5 MG/125ML-% IV SOLN (PREMIX)
5.0000 mg/h | INTRAVENOUS | Status: DC
  Administered 2021-02-08: 5 mg/h via INTRAVENOUS
  Filled 2021-02-08: qty 125

## 2021-02-08 NOTE — Progress Notes (Signed)
Physical Therapy Treatment Patient Details Name: Gary Carroll MRN: [id number] DOB: Mar 11, 1946 Today's Date: 02/08/2021   History of Present Illness 75 y.o. male admitted to [facility] 9/16 with sepsis, weakness, fatigue, body aches, and LE swelling. Pt with flash pulmonary edema in ED and Intubated. Transfer to [facility] 9/17. Extubated 9/18.  PMH: HTN, HLD, OSA, GERD, HLD, AKI, Afib, and staph infection to Bil LE    PT Comments    Pt tolerated treatment well with A fib during session. HR sustained in 120s-130s with activity with intermittent increases into high 140s/150s. Performed bed mobility with mod A. Demonstrated significant posterior lean with mod A to steady. Pt required similar assist with transfers and ambulation compared to previous session. Ambulation distance decreased due to fatigue and cardiovascular status. Continue to recommend SNF due to pt's functional status and decreased support at home.     Recommendations for follow up therapy are one component of a multi-disciplinary discharge planning process, led by the attending physician.  Recommendations may be updated based on patient status, additional functional criteria and insurance authorization.  Follow Up Recommendations  SNF;Supervision for mobility/OOB     Equipment Recommendations       Recommendations for Other Services       Precautions / Restrictions Precautions Precautions: Fall Precaution Comments: watch HR Restrictions Weight Bearing Restrictions: No     Mobility  Bed Mobility Overal bed mobility: Needs Assistance Bed Mobility: Supine to Sit     Supine to sit: Mod assist     General bed mobility comments: Mod A to elevate trunk with assist from bed rails. Able to scoot hips forward to EOB once upright Patient Response: Cooperative  Transfers Overall transfer level: Needs assistance Equipment used: Rolling walker (2 wheeled) Transfers: Sit to/from Stand Sit to Stand: Mod assist          General transfer comment: Sit to stand x3 with increased timing and mod A to boost to stand for all trials. Verbal cues for forward lean.  Ambulation/Gait Ambulation/Gait assistance: Min guard Gait Distance (Feet): 24 Feet Assistive device: Rolling walker (2 wheeled) Gait Pattern/deviations: Step-through pattern;Trunk flexed;Decreased stride length Gait velocity: decreased Gait velocity interpretation: <1.31 ft/sec, indicative of household ambulator General Gait Details: Min guard for safety. Ambulated 24' in room x2 trials with seated rest in between. Verbal cues for maintainence of hips in RW and upright posture. Knee buckling not present during session.   Stairs             Wheelchair Mobility    Modified Rankin (Stroke Patients Only)       Balance Overall balance assessment: Needs assistance Sitting-balance support: Bilateral upper extremity supported;Feet unsupported Sitting balance-Leahy Scale: Poor Sitting balance - Comments: Demonstrated posterior lean initially with mod A to push forward and steady. Able to maintain balance once steadied Postural control: Posterior lean Standing balance support: Bilateral upper extremity supported;During functional activity Standing balance-Leahy Scale: Poor Standing balance comment: Required BUE support on RW during gait.                            Cognition Arousal/Alertness: Awake/alert Behavior During Therapy: WFL for tasks assessed/performed Overall Cognitive Status: Within Functional Limits for tasks assessed                                        Exercises General Exercises -  Lower Extremity Short Arc Quad: AROM;Seated;Both;20 reps    General Comments General comments (skin integrity, edema, etc.): HR up to 155 while sitting EOB. Sustained in 120s-130s throughout session with intermittent increases to high 140s      Pertinent Vitals/Pain Pain Assessment: No/denies pain    Home  Living                      Prior Function            PT Goals (current goals can now be found in the care plan section) Progress towards PT goals: Not progressing toward goals - comment (Required similar assist to previous session with decreased ambulation distance demonstrated due to cardiovascular status)    Frequency    Min 3X/week      PT Plan Current plan remains appropriate    Co-evaluation              AM-PAC PT "6 Clicks" Mobility   Outcome Measure  Help needed turning from your back to your side while in a flat bed without using bedrails?: A Little Help needed moving from lying on your back to sitting on the side of a flat bed without using bedrails?: A Little Help needed moving to and from a bed to a chair (including a wheelchair)?: A Little Help needed standing up from a chair using your arms (e.g., wheelchair or bedside chair)?: A Lot Help needed to walk in hospital room?: A Lot Help needed climbing 3-5 steps with a railing? : A Lot 6 Click Score: 15    End of Session Equipment Utilized During Treatment: Gait belt Activity Tolerance: Patient tolerated treatment well Patient left: in chair;with call bell/phone within reach;with chair alarm set Nurse Communication: Mobility status PT Visit Diagnosis: Other abnormalities of gait and mobility (R26.89);Unsteadiness on feet (R26.81);Muscle weakness (generalized) (M62.81)     Time: 6004-5997 PT Time Calculation (min) (ACUTE ONLY): 37 min  Charges:  $Gait Training: 8-22 mins $Therapeutic Exercise: 8-22 mins                     Louie Casa, SPT Acute Rehab: [phone]    Domingo Dimes 02/08/2021, 11:07 AM

## 2021-02-08 NOTE — Progress Notes (Signed)
Date and time results received: 02/08/21 0642 (use smartphrase ".now" to insert current time)  Test: troponin  Critical Value: 315  Name of Provider Notified: Shalhoub  Orders Received? Or Actions Taken?: Orders Received - See Orders for details

## 2021-02-08 NOTE — Progress Notes (Signed)
PROGRESS NOTE    Gary Carroll  LSL:[id number] DOB: 04-22-1946 DOA: 02/02/2021 PCP: Celene Squibb, MD    Brief Narrative:  Mr. Gary Carroll was admitted to the hospital with the working diagnosis of septic shock due to urine infection, complicated with respiratory failure due to acute pulmonary edema.    75 year old male past medical history of hypertension, GERD, dyslipidemia, atrial fibrillation, and obesity class II who presented with fatigue, body aches, lower extremity edema and erythema.  Initially diagnosed with lower extremity cellulitis, received doxycycline and ceftriaxone and discharged home.  He was brought back to the emergency department by EMS due to altered mentation.  Apparently he was not in the pharmacy getting his prescriptions when EMS was called.  On his initial physical examination he was febrile 102.1 F, blood pressure 80/60, heart rate 81, respiratory rate 28, oxygen saturation 97%.  His lungs were clear to auscultation, heart S1-S2, present, tachycardic, abdomen was soft, nontender, his lower extremities were cool, positive edema and bilateral erythema.   Sodium 136, potassium 4.2, chloride 105, bicarb 23, glucose 135, BUN 22, creatinine 1.0, lactic acid 3.0-2.6, white count 16.7, hemoglobin 13.8, hematocrit 42.3, platelets 162. SARS COVID-19 negative.   Urinalysis, specific gravity 1.017, 6-10 red cells,> 50 white cells.   His chest radiograph had no infiltrates.   EKG 90 bpm, normal axis, normal intervals, sinus rhythm, V4-V6 ST depressions, no significant T wave changes.   Patient received aggressive volume resuscitation and broad-spectrum antibiotic therapy. Required vasopressors due to persistent shock. He developed pulmonary edema and required intubation with mechanical ventilation.   9/17 transfer from [facility] to [facility] for further management.   9/18 vasopressors were discontinued and patient was liberated from mechanical ventilation.   Transferred to [facility]  9/20.  02/08/21. Patient developed atrial fibrillation with rapid ventricular response, requiring diltiazem infusion.    Assessment & Plan:   Principal Problem:   Septic shock ([place]) Active Problems:   Essential hypertension, benign   Erectile dysfunction   AF (paroxysmal atrial fibrillation) (HCC)   Sepsis due to cellulitis (HCC)   BPH (benign prostatic hyperplasia)   Sleep apnea   Acute respiratory failure with hypoxia and hypercapnia (HCC)   Acute pulmonary edema (HCC)   Pressure injury of skin   Septic shock due to urine infection, in the setting of chronic self catheterization/ urinary retention.  Wbc is 10,2 and cultures remain with no significant growth.    Antibiotic therapy with ceftriaxone #4/5.  CT abdomen and pelvis from 09.16.22 with no hydronephrosis, prostate enlargement.   Patient very weak and deconditioned, will need SNF.    2. Acute hypoxemic respiratory failure due to acute diastolic heart failure, volume overload.  Echocardiogram with LV systolic function 55 to 11%.  His oxymetry is 95% on room air.  Has chronic lower extremity edema.    On furosemide 40 mg daily and blood pressure control with metoprolol and losartan.    3. Paroxysmal atrial fibrillation.  Patient converted to atrial fibrillation with rapid ventricular response this am. Now on diltiazem drip at 10 mg per Hr with HR controlled.  Positive troponin elevation, likely demand ischemia from tachyarrhythmia.   Plan to increase metoprolol to 50 mg po bid and wean off diltiazem drip.  Echocardiogram on this admission with preserved LV systolic function.  Continue anticoagulation with apixaban.   Patient with no chest pain. EKG from this am 112 bpm, normal axis, normal qtc, atrial fibrillation rhythm, ST depression V5-V6 and T wave inversions V2-V6.  4. Hypokalemia, non anion gap metabolic acidosis.  Renal function stable with serum cr today at 0,84, K is 3,7 and  serum bicarbonate at 20.     Add 40 meq Kcl and follow renal function in am.   5. Dyslipidemia. On statin therapy.     6. Peripheral vascular disease. Lower extremity discoloration.    7. Obesity class 2. Stage 2 sacrum pressure ulcer present on admission.  Calculated BMI is 37.7.  Local skin care.   8. Pulmonary nodule.  Positive left apical nodular infiltrate, positive enlarged mediastinal, bilateral hilar, retroperitoneal, and mesenteric lymph nodes. Patient will follow up with Dr Lamonte Sakai from pulmonary as outpatient.   Patient continue to be at high risk for worsening atrial fibrillation   Status is: Inpatient  Remains inpatient appropriate because:Inpatient level of care appropriate due to severity of illness  Dispo: The patient is from: Home              Anticipated d/c is to: SNF              Patient currently is not medically stable to d/c.   Difficult to place patient No  DVT prophylaxis: Apixaban   Code Status:    full  Family Communication:  No family at the bedside      Nutrition Status:           Skin Documentation: Pressure Injury 02/03/21 Sacrum Lower;Medial Stage 2 -  Partial thickness loss of dermis presenting as a shallow open injury with a red, pink wound bed without slough. (Active)  02/03/21 0330  Location: Sacrum  Location Orientation: Lower;Medial  Staging: Stage 2 -  Partial thickness loss of dermis presenting as a shallow open injury with a red, pink wound bed without slough.  Wound Description (Comments):   Present on Admission: Yes    Antimicrobials:  Ceftriaxone     Subjective: Patient this am with tachycardia, feeling better at the time of my examination, he is on diltiazem infusion. No nausea or vomiting, out of bed to chair.   Objective: Vitals:   02/07/21 2338 02/08/21 0123 02/08/21 0321 02/08/21 0730  BP:  119/88  118/67  Pulse: 70 (!) 113  (!) 112  Resp: 16 20  20   Temp:  98.4 F (36.9 C) 97.6 F (36.4 C)   TempSrc:  Oral    SpO2: 96% 94%  95%   Weight:      Height:        Intake/Output Summary (Last 24 hours) at 02/08/2021 1200 Last data filed at 02/08/2021 0700 Gross per 24 hour  Intake 865.77 ml  Output 1000 ml  Net -134.23 ml   Filed Weights   02/02/21 1658 02/03/21 0320  Weight: 121.6 kg 122.8 kg    Examination:   General: Not in pain or dyspnea, deconditioned  Neurology: Awake and alert, non focal  E ENT: no pallor, no icterus, oral mucosa moist Cardiovascular: No JVD. S1-S2 present, irregularly irregular, no gallops, rubs, or murmurs. ++ non pitting lower extremity edema. Pulmonary: positive breath sounds bilaterally, adequate air movement, no wheezing, rhonchi or rales. Gastrointestinal. Abdomen protuberant Skin. Bilateral lower extremity discoloration Musculoskeletal: no joint deformities     Data Reviewed: I have personally reviewed following labs and imaging studies  CBC: Recent Labs  Lab 02/04/21 0425 02/05/21 0037 02/06/21 0349 02/07/21 0244 02/08/21 0215  WBC 10.3 8.4 7.1 10.7* 10.2  HGB 13.3 13.1 12.6* 12.8* 13.1  HCT 41.2 40.3 39.6 39.8 40.6  MCV 88.8 90.2  90.6 89.6 88.6  PLT 120* 133* 155 155 159   Basic Metabolic Panel: Recent Labs  Lab 02/03/21 0608 02/04/21 0425 02/05/21 0037 02/06/21 0349 02/07/21 0244 02/08/21 0215  NA 137 138 140 140 140 138  K 4.5 3.6 3.4* 3.4* 4.4 3.7  CL 103 106 106 110 110 108  CO2 20* 22 22 20* 21* 20*  GLUCOSE 157* 139* 105* 120* 122* 123*  BUN 24* 23 22 20 16 19   CREATININE 1.76* 1.14 1.05 0.91 0.89 0.83  CALCIUM 8.4* 8.5* 8.5* 8.3* 8.2* 8.4*  MG 1.7 1.9 2.3  --   --  2.2  PHOS 3.7 2.8 3.1  --   --   --    GFR: Estimated Creatinine Clearance: 102.6 mL/min (by C-G formula based on SCr of 0.83 mg/dL). Liver Function Tests: Recent Labs  Lab 02/03/21 0608  AST 50*  ALT 26  ALKPHOS 48  BILITOT 0.9  PROT 5.9*  ALBUMIN 2.6*   No results for input(s): LIPASE, AMYLASE in the last 168 hours. No results for input(s): AMMONIA in the last 168  hours. Coagulation Profile: No results for input(s): INR, PROTIME in the last 168 hours. Cardiac Enzymes: No results for input(s): CKTOTAL, CKMB, CKMBINDEX, TROPONINI in the last 168 hours. BNP (last 3 results) No results for input(s): PROBNP in the last 8760 hours. HbA1C: No results for input(s): HGBA1C in the last 72 hours. CBG: Recent Labs  Lab 02/05/21 0738 02/05/21 1127 02/05/21 1523 02/05/21 2146 02/06/21 0800  GLUCAP 109* 131* 121* 126* 103*   Lipid Profile: No results for input(s): CHOL, HDL, LDLCALC, TRIG, CHOLHDL, LDLDIRECT in the last 72 hours. Thyroid Function Tests: Recent Labs    02/08/21 0518  TSH 1.443   Anemia Panel: No results for input(s): VITAMINB12, FOLATE, FERRITIN, TIBC, IRON, RETICCTPCT in the last 72 hours.    Radiology Studies: I have reviewed all of the imaging during this hospital visit personally     Scheduled Meds:  apixaban  5 mg Oral BID   Chlorhexidine Gluconate Cloth  6 each Topical Q0600   docusate sodium  100 mg Oral BID   furosemide  40 mg Oral Daily   losartan  50 mg Oral Daily   metoprolol tartrate  25 mg Oral BID   polyethylene glycol  17 g Oral Daily   rosuvastatin  20 mg Oral Daily   Continuous Infusions:  sodium chloride Stopped (02/05/21 1846)   diltiazem (CARDIZEM) infusion 10 mg/hr (02/08/21 0645)     LOS: 6 days        Leiliana Foody Gerome Apley, MD

## 2021-02-08 NOTE — TOC Progression Note (Addendum)
Transition of Care [facility]) - Progression Note    Patient Details  Name: Gary Carroll MRN: [id number] Date of Birth: December 09, 1945  Transition of Care [facility]) CM/SW [place], RN Phone Number:[phone]  02/08/2021, 3:38 PM  Clinical Narrative:    CM spoke with Sherrilee Gilles at [facility]. Per [place] patient is not service connected and will need to use medicare benefits for rehab admission. Patient info has been faxed out. Will await bed offers.  1600 CM at bedside to make patient aware that he is not service connected and therefore will not go to rehab under [facility] benefits. Patient states that this has happened to him before and he had to go to outpatient rehab which has a $40 co pay for each visit. Patient has also been made aware that he can try to get [place] inpatient coverage and if it is approved the closest facility would be [facility]. Patient states that he does not want to go to [facility] and that he would like to know if outpatient therapy would be an option?   Expected Discharge Plan: IP Rehab Facility Barriers to Discharge: Continued Medical Work up  Expected Discharge Plan and Services Expected Discharge Plan: [place] In-house Referral: NA Discharge Planning Services: CM Consult Post Acute Care Choice: NA Living arrangements for the past 2 months: Apartment                 DME Arranged: N/A DME Agency: NA       HH Arranged: NA HH Agency: NA         Social Determinants of Health (SDOH) Interventions    Readmission Risk Interventions Readmission Risk Prevention Plan 02/07/2021  Transportation Screening Complete  PCP or Specialist Appt within 5-7 Days Complete  Home Care Screening Complete  Medication Review (RN CM) Referral to Pharmacy  Some recent data might be hidden

## 2021-02-08 NOTE — Progress Notes (Signed)
   02/08/21 0123  Assess: MEWS Score  Temp 98.4 F (36.9 C)  BP 119/88  Pulse Rate (!) 113  Resp 20  Level of Consciousness Alert  SpO2 94 %  O2 Device CPAP  Assess: MEWS Score  MEWS Temp 0  MEWS Systolic 0  MEWS Pulse 2  MEWS RR 0  MEWS LOC 0  MEWS Score 2  MEWS Score Color Yellow  Assess: if the MEWS score is Yellow or Red  Were vital signs taken at a resting state? Yes  Focused Assessment Change from prior assessment (see assessment flowsheet)  Early Detection of Sepsis Score *See Row Information* Medium  MEWS guidelines implemented *See Row Information* Yes  Treat  MEWS Interventions Other (Comment) (MD notified)  Pain Scale 0-10  Pain Score 0  Take Vital Signs  Increase Vital Sign Frequency  Yellow: Q 2hr X 2 then Q 4hr X 2, if remains yellow, continue Q 4hrs  Escalate  MEWS: Escalate Yellow: discuss with charge nurse/RN and consider discussing with provider and RRT  Notify: Charge Nurse/RN  Name of Charge Nurse/RN Notified [place]  Date Charge Nurse/RN Notified 02/08/21  Time Charge Nurse/RN Notified 0045  Notify: Provider  Provider Name/Title G. Shalhoub  Date Provider Notified 02/08/21  Time Provider Notified [phone]  Notification Type Page (secure chat)  Notification Reason Change in status  Provider response Other (Comment) (pending)  Patient is asymptomatic, EKG obtained  no CP or SOB, MD notified per order treated with metoprolol 2.5 mg x2 now waiting for the next intervention. Will continue to monitor

## 2021-02-08 NOTE — Progress Notes (Addendum)
HOSPITAL MEDICINE OVERNIGHT EVENT NOTE    Notified by nursing that patient's troponin this morning is 315.  Troponin performed after development of rapid Afib to identify etiology.   Patient is currently on a diltiazem infusion which is actively being titrated with heart rate slowly improving.  Patient continues to be chest pain free.    While elevated troponin is likely secondary to supply demand mismatch from rapid atrial fibrillation, will perform serial EKGs and serial troponins to ensure that this has peaked and is downtrending.  While ACS is currently felt to be quite unlikely, patient is already on anticoagulation anyway with Eliquis.  Obtaining repeat troponin at 11 AM.  If it is continuing to rise rapidly at that time will obtain cardiology consult.  Vernelle Emerald  MD [facility]

## 2021-02-08 NOTE — TOC Progression Note (Signed)
Transition of Care [facility]) - Progression Note    Patient Details  Name: Gary Carroll MRN: [id number] Date of Birth: 10-01-1945  Transition of [facility]) CM/SW [place], RN Phone Number:[phone]   Clinical Narrative:    CM has left message from [place] [phone] ext [phone]) SW at [place]. CM can not move forward with bed search until making contact with [facility] SW to determine facilities for inpatient rehab.   Expected Discharge Plan: IP Rehab Facility Barriers to Discharge: Continued Medical Work up  Expected Discharge Plan and Services Expected Discharge Plan: [place] In-house Referral: NA Discharge Planning Services: CM Consult Post Acute Care Choice: NA Living arrangements for the past 2 months: Apartment                 DME Arranged: N/A DME Agency: NA       HH Arranged: NA HH Agency: NA         Social Determinants of Health (SDOH) Interventions    Readmission Risk Interventions Readmission Risk Prevention Plan 02/07/2021  Transportation Screening Complete  PCP or Specialist Appt within 5-7 Days Complete  Home Care Screening Complete  Medication Review (RN CM) Referral to Pharmacy  Some recent data might be hidden

## 2021-02-08 NOTE — Progress Notes (Signed)
Patient's in afib with hr in 120-150. Asymptomatic EKG obtain with afib RVR 139.  MD Shalhoub notified via secure chat. Will continue to monitor

## 2021-02-08 NOTE — Progress Notes (Signed)
HOSPITAL MEDICINE OVERNIGHT EVENT NOTE    Notified by nursing at approximately 130AM that patient is now in rapid atrial fibrillation with heart rates ranging between 120's to 150's.   Nursing reports the patient is hemodynamically stable and denies shortness of breath or chest pain.  Chart reviewed, patient currently hospitalized for sepsis and septic shock due to urinary tract infection.  Patient has known history of paroxysmal atrial fibrillation and is on chronic apixaban therapy for anticoagulation.  Will attempt to give a series of doses of intravenous metoprolol to achieve rate control, monitor closely on telemetry.  We will additionally obtain chemistry and magnesium and correct electrolytes as necessary.  Gary Emerald  MD [facility]    ADDENDUM (4:30AM 9/22)  After 2 individual doses of 2.5 mg of intravenous metoprolol patient is continuing to exhibit rapid atrial fibrillation with heart rates in excess of 130 bpm.  Patient remains hemodynamically stable and is asymptomatic otherwise.  Review of chemistry this morning reveals a potassium of 3.7.  40 mill equivalents of oral potassium chloride will be administered.  Etiology of uncontrolled atrial fibrillation is unclear.  Will obtain troponin, TSH.  Thromboembolism unlikely as patient is on chronic Eliquis therapy.  Recent echocardiogram has already been obtained on 9/18.  Since doses of intravenous metoprolol have been unsuccessful in achieving any semblance of rate control will initiate diltiazem infusion.  Changing patient to progressive status.  Continue to monitor patient closely.  Gary Carroll

## 2021-02-09 DIAGNOSIS — J9601 Acute respiratory failure with hypoxia: Secondary | ICD-10-CM | POA: Diagnosis not present

## 2021-02-09 DIAGNOSIS — L8942 Pressure ulcer of contiguous site of back, buttock and hip, stage 2: Secondary | ICD-10-CM | POA: Diagnosis not present

## 2021-02-09 DIAGNOSIS — I480 Paroxysmal atrial fibrillation: Secondary | ICD-10-CM | POA: Diagnosis not present

## 2021-02-09 DIAGNOSIS — A419 Sepsis, unspecified organism: Secondary | ICD-10-CM | POA: Diagnosis not present

## 2021-02-09 LAB — BASIC METABOLIC PANEL
Anion gap: 8 (ref 5–15)
BUN: 22 mg/dL (ref 8–23)
CO2: 22 mmol/L (ref 22–32)
Calcium: 8.6 mg/dL — ABNORMAL LOW (ref 8.9–10.3)
Chloride: 110 mmol/L (ref 98–111)
Creatinine, Ser: 0.89 mg/dL (ref 0.61–1.24)
GFR, Estimated: >60 mL/min (ref >60)
Glucose, Bld: 124 mg/dL — ABNORMAL HIGH (ref 70–99)
Potassium: 4.1 mmol/L (ref 3.5–5.1)
Sodium: 140 mmol/L (ref 135–145)

## 2021-02-09 LAB — CBC
HCT: 40.9 % (ref 39.0–52.0)
Hemoglobin: 12.9 g/dL — ABNORMAL LOW (ref 13.0–17.0)
MCH: 28.6 pg (ref 26.0–34.0)
MCHC: 31.5 g/dL (ref 30.0–36.0)
MCV: 90.7 fL (ref 80.0–100.0)
Platelets: 219 10*3/uL (ref 150–400)
RBC: 4.51 MIL/uL (ref 4.22–5.81)
RDW: 15.4 % (ref 11.5–15.5)
WBC: 8.5 10*3/uL (ref 4.0–10.5)
nRBC: 0 % (ref 0.0–0.2)

## 2021-02-09 MED ORDER — POTASSIUM CHLORIDE CRYS ER 10 MEQ PO TBCR
10.0000 meq | EXTENDED_RELEASE_TABLET | Freq: Every day | ORAL | Status: DC
  Administered 2021-02-09 – 2021-02-16 (×8): 10 meq via ORAL
  Filled 2021-02-09 (×8): qty 1

## 2021-02-09 NOTE — Progress Notes (Signed)
PROGRESS NOTE    Gary Carroll  TDV:[id number] DOB: Jan 24, 1946 DOA: 02/02/2021 PCP: Celene Squibb, MD    Brief Narrative:  Gary Carroll was admitted to the hospital with the working diagnosis of septic shock due to urine infection, complicated with respiratory failure due to acute pulmonary edema.    75 year old male past medical history of hypertension, GERD, dyslipidemia, atrial fibrillation, and obesity class II who presented with fatigue, body aches, lower extremity edema and erythema.  Initially diagnosed with lower extremity cellulitis, received doxycycline and ceftriaxone and discharged home.  He was brought back to the emergency department by EMS due to altered mentation.  Apparently he was not in the pharmacy getting his prescriptions when EMS was called.  On his initial physical examination he was febrile 102.1 F, blood pressure 80/60, heart rate 81, respiratory rate 28, oxygen saturation 97%.  His lungs were clear to auscultation, heart S1-S2, present, tachycardic, abdomen was soft, nontender, his lower extremities were cool, positive edema and bilateral erythema.   Sodium 136, potassium 4.2, chloride 105, bicarb 23, glucose 135, BUN 22, creatinine 1.0, lactic acid 3.0-2.6, white count 16.7, hemoglobin 13.8, hematocrit 42.3, platelets 162. SARS COVID-19 negative.   Urinalysis, specific gravity 1.017, 6-10 red cells,> 50 white cells.   His chest radiograph had no infiltrates.   EKG 90 bpm, normal axis, normal intervals, sinus rhythm, V4-V6 ST depressions, no significant T wave changes.   Patient received aggressive volume resuscitation and broad-spectrum antibiotic therapy. Required vasopressors due to persistent shock. He developed pulmonary edema and required intubation with mechanical ventilation.   9/17 transfer from [facility] to [facility] for further management.   9/18 vasopressors were discontinued and patient was liberated from mechanical ventilation.   Transferred to [facility]  9/20.   02/08/21. Patient developed atrial fibrillation with rapid ventricular response, requiring diltiazem infusion.      Assessment & Plan:   Principal Problem:   Septic shock ([place]) Active Problems:   Essential hypertension, benign   Erectile dysfunction   AF (paroxysmal atrial fibrillation) (HCC)   Sepsis due to cellulitis (HCC)   BPH (benign prostatic hyperplasia)   Sleep apnea   Acute respiratory failure with hypoxia and hypercapnia (HCC)   Acute pulmonary edema (HCC)   Pressure injury of skin    Septic shock due to urine infection, in the setting of chronic self catheterization/ urinary retention.  Cultures remain with no significant growth.   Antibiotic therapy with ceftriaxone #5/5.  CT abdomen and pelvis from 09.16.22 with no hydronephrosis, prostate enlargement.    Pending transfer to SNF.    2. Acute hypoxemic respiratory failure due to acute diastolic heart failure, volume overload. HTN Echocardiogram with LV systolic function 55 to 06%.  His oxymetry is 95% on room air.  Has chronic lower extremity edema.    Continue torsemide 40 mg daily per his home dosing.  Continue holding losartan for now, continue with metoprolol.    3. Paroxysmal atrial fibrillation.  Patient converted back to sinus rhythm, will continue rate control with metoprolol to 50 mg po bid  Off diltiazem.  Echocardiogram on this admission with preserved LV systolic function.  Anticoagulation with apixaban.  Troponin elevation due to demand ischemic due to RVR. No ACS   4. Hypokalemia, non anion gap metabolic acidosis.  Stable renal function and electrolytes, patient is tolerating po well,  K is 4,1 and serum bicarbonate at 22. Cr at 0,89.  Add daily Kcl to prevent hypokalemia related to loop diuretics.   5.  Dyslipidemia. Continue with statin therapy.     6. Peripheral vascular disease. Chronic lower extremity discoloration.    7. Obesity class 2. Stage 2 sacrum pressure ulcer  present on admission.  BMI is 37.7.  Local skin care.    8. Pulmonary nodule.  Positive left apical nodular infiltrate, positive enlarged mediastinal, bilateral hilar, retroperitoneal, and mesenteric lymph nodes. Patient will follow up with Dr Lamonte Sakai from pulmonary as outpatient.     Status is: Inpatient  Remains inpatient appropriate because:Inpatient level of care appropriate due to severity of illness  Dispo: The patient is from: Home              Anticipated d/c is to: SNF              Patient currently is not medically stable to d/c.   Difficult to place patient No  DVT prophylaxis: Apixban   Code Status:    full  Family Communication:  No family at the bedside      Nutrition Status:           Skin Documentation: Pressure Injury 02/03/21 Sacrum Lower;Medial Stage 2 -  Partial thickness loss of dermis presenting as a shallow open injury with a red, pink wound bed without slough. (Active)  02/03/21 0330  Location: Sacrum  Location Orientation: Lower;Medial  Staging: Stage 2 -  Partial thickness loss of dermis presenting as a shallow open injury with a red, pink wound bed without slough.  Wound Description (Comments):   Present on Admission: Yes      Subjective: Patient is feeling better, no nausea or vomiting no chest pain or dyspnea, continue to be very weak and deconditioned.   Objective: Vitals:   02/09/21 0743 02/09/21 0857 02/09/21 0900 02/09/21 1112  BP: (!) 155/72  (!) 140/54 139/67  Pulse: 77 73 61 60  Resp: 16  16 15   Temp: 98.2 F (36.8 C)   98.2 F (36.8 C)  TempSrc: Oral   Oral  SpO2: 98%     Weight:      Height:        Intake/Output Summary (Last 24 hours) at 02/09/2021 1245 Last data filed at 02/09/2021 1121 Gross per 24 hour  Intake --  Output 2250 ml  Net -2250 ml   Filed Weights   02/02/21 1658 02/03/21 0320  Weight: 121.6 kg 122.8 kg    Examination:   General: Not in pain or dyspnea.  Neurology: Awake and alert, non focal   E ENT: no pallor, no icterus, oral mucosa moist Cardiovascular: No JVD. S1-S2 present, rhythmic, no gallops, rubs, or murmurs. Chronic non pitting bilateral lower extremity edema. Pulmonary: positive breath sounds bilaterally,  no wheezing, rhonchi or rales. Gastrointestinal. Abdomen soft and non tender Skin. No rashes Musculoskeletal: no joint deformities     Data Reviewed: I have personally reviewed following labs and imaging studies  CBC: Recent Labs  Lab 02/05/21 0037 02/06/21 0349 02/07/21 0244 02/08/21 0215 02/09/21 0248  WBC 8.4 7.1 10.7* 10.2 8.5  HGB 13.1 12.6* 12.8* 13.1 12.9*  HCT 40.3 39.6 39.8 40.6 40.9  MCV 90.2 90.6 89.6 88.6 90.7  PLT 133* 155 155 189 024   Basic Metabolic Panel: Recent Labs  Lab 02/03/21 0608 02/04/21 0425 02/05/21 0037 02/06/21 0349 02/07/21 0244 02/08/21 0215 02/09/21 0248  NA 137 138 140 140 140 138 140  K 4.5 3.6 3.4* 3.4* 4.4 3.7 4.1  CL 103 106 106 110 110 108 110  CO2 20* 22 22 20*  21* 20* 22  GLUCOSE 157* 139* 105* 120* 122* 123* 124*  BUN 24* 23 22 20 16 19 22   CREATININE 1.76* 1.14 1.05 0.91 0.89 0.83 0.89  CALCIUM 8.4* 8.5* 8.5* 8.3* 8.2* 8.4* 8.6*  MG 1.7 1.9 2.3  --   --  2.2  --   PHOS 3.7 2.8 3.1  --   --   --   --    GFR: Estimated Creatinine Clearance: 95.7 mL/min (by C-G formula based on SCr of 0.89 mg/dL). Liver Function Tests: Recent Labs  Lab 02/03/21 0608  AST 50*  ALT 26  ALKPHOS 48  BILITOT 0.9  PROT 5.9*  ALBUMIN 2.6*   No results for input(s): LIPASE, AMYLASE in the last 168 hours. No results for input(s): AMMONIA in the last 168 hours. Coagulation Profile: No results for input(s): INR, PROTIME in the last 168 hours. Cardiac Enzymes: No results for input(s): CKTOTAL, CKMB, CKMBINDEX, TROPONINI in the last 168 hours. BNP (last 3 results) No results for input(s): PROBNP in the last 8760 hours. HbA1C: No results for input(s): HGBA1C in the last 72 hours. CBG: Recent Labs  Lab  02/05/21 0738 02/05/21 1127 02/05/21 1523 02/05/21 2146 02/06/21 0800  GLUCAP 109* 131* 121* 126* 103*   Lipid Profile: No results for input(s): CHOL, HDL, LDLCALC, TRIG, CHOLHDL, LDLDIRECT in the last 72 hours. Thyroid Function Tests: Recent Labs    02/08/21 0518  TSH 1.443   Anemia Panel: No results for input(s): VITAMINB12, FOLATE, FERRITIN, TIBC, IRON, RETICCTPCT in the last 72 hours.    Radiology Studies: I have reviewed all of the imaging during this hospital visit personally     Scheduled Meds:  apixaban  5 mg Oral BID   Chlorhexidine Gluconate Cloth  6 each Topical Q0600   docusate sodium  100 mg Oral BID   furosemide  40 mg Oral Daily   losartan  50 mg Oral Daily   metoprolol tartrate  25 mg Oral Once   metoprolol tartrate  50 mg Oral BID   polyethylene glycol  17 g Oral Daily   rosuvastatin  20 mg Oral Daily   Continuous Infusions:  sodium chloride Stopped (02/05/21 1846)   diltiazem (CARDIZEM) infusion Stopped (02/08/21 1332)     LOS: 7 days        Tamara Monteith Gerome Apley, MD

## 2021-02-09 NOTE — TOC Progression Note (Signed)
Transition of Care [facility]) - Progression Note    Patient Details  Name: Gary Carroll MRN: [id number] Date of Birth: Oct 09, 1945  Transition [facility] CM/SW Asher, RN Phone Number:[phone]  02/09/2021, 9:54 AM  Clinical Narrative:    Patient currently has no bed offers. CM has extended search beyond [place] area to explore more options.  1040 CM at bedside with the patient patient has been updated on bed search expansion. Patient states that he feels that he needs inpatient rehab but would be open to [facility] if possible. TOC will continue to follow for bed offers.    Expected Discharge Plan: IP Rehab Facility Barriers to Discharge: Continued Medical Work up  Expected Discharge Plan and Services Expected Discharge Plan: [place] In-house Referral: NA Discharge Planning Services: CM Consult Post Acute Care Choice: NA Living arrangements for the past 2 months: Apartment                 DME Arranged: N/A DME Agency: NA       HH Arranged: NA HH Agency: NA         Social Determinants of Health (SDOH) Interventions    Readmission Risk Interventions Readmission Risk Prevention Plan 02/07/2021  Transportation Screening Complete  PCP or Specialist Appt within 5-7 Days Complete  Home Care Screening Complete  Medication Review (RN CM) Referral to Pharmacy  Some recent data might be hidden

## 2021-02-09 NOTE — Progress Notes (Signed)
Occupational Therapy Treatment Patient Details Name: Gary Carroll MRN: [id number] DOB: 1946/02/01 Today's Date: 02/09/2021   History of present illness 75 y.o. male admitted to [facility] 9/16 with sepsis, weakness, fatigue, body aches, and LE swelling. Pt with flash pulmonary edema in ED and Intubated. Transfer to [facility] 9/17. Extubated 9/18.  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  Patient was min assist for bed mobility due to assistance with BLEs.  Patient performed bathing standing at sink and dressing seated on eob.  Patient making good progress with OT treatment. Acute OT to continue to follow.    Recommendations for follow up therapy are one component of a multi-disciplinary discharge planning process, led by the attending physician.  Recommendations may be updated based on patient status, additional functional criteria and insurance authorization.    Follow Up Recommendations  SNF;Supervision/Assistance - 24 hour    Equipment Recommendations       Recommendations for Other Services      Precautions / Restrictions Precautions Precautions: Fall Precaution Comments: watch HR Restrictions Weight Bearing Restrictions: No       Mobility Bed Mobility Overal bed mobility: Needs Assistance Bed Mobility: Supine to Sit     Supine to sit: Min assist     General bed mobility comments: min assist with HOB elevated    Transfers Overall transfer level: Needs assistance Equipment used: Rolling walker (2 wheeled) Transfers: Sit to/from Stand Sit to Stand: Min guard         General transfer comment: used rw and required vcs for hand placement    Balance Overall balance assessment: Needs assistance Sitting-balance support: No upper extremity supported;Feet supported Sitting balance-Leahy Scale: Fair     Standing balance support:  Single extremity supported;During functional activity Standing balance-Leahy Scale: Poor Standing balance comment: able to perform self care with one ue supported                           ADL either performed or assessed with clinical judgement   ADL Overall ADL's : Needs assistance/impaired     Grooming: Wash/dry hands;Wash/dry face;Oral care;Brushing hair;Standing;Supervision/safety Grooming Details (indicate cue type and reason): Patient stood at sink for grooming without balance issues Upper Body Bathing: Supervision/ safety;Standing   Lower Body Bathing: Min guard;Sit to/from stand;Minimal assistance   Upper Body Dressing : Set up;Sitting Upper Body Dressing Details (indicate cue type and reason): donned gown Lower Body Dressing: Minimal assistance Lower Body Dressing Details (indicate cue type and reason): donned footwear             Functional mobility during ADLs: Min guard;Rolling walker General ADL Comments: ambulated to sink to perform self care     Vision       Perception     Praxis      Cognition Arousal/Alertness: Awake/alert Behavior During Therapy: WFL for tasks assessed/performed Overall Cognitive Status: Within Functional Limits for tasks assessed                                 General Comments: demonstrated good safety        Exercises     Shoulder Instructions       General Comments      Pertinent Vitals/ Pain  Pain Assessment: No/denies pain  Home Living                                          Prior Functioning/Environment              Frequency  Min 2X/week        Progress Toward Goals  OT Goals(current goals can now be found in the care plan section)  Progress towards OT goals: Progressing toward goals  Acute Rehab OT Goals Patient Stated Goal: [facility] Goal Formulation: With patient Time For Goal Achievement: 02/21/21 Potential to Achieve  Goals: Good ADL Goals Pt Will Perform Grooming: with modified independence;standing Pt Will Perform Upper Body Dressing: with modified independence;sitting Pt Will Perform Lower Body Dressing: with modified independence;sit to/from stand Pt Will Transfer to Toilet: with modified independence;ambulating Pt Will Perform Toileting - Clothing Manipulation and hygiene: with modified independence;sitting/lateral leans;sit to/from stand Pt Will Perform Tub/Shower Transfer: Tub transfer;with min guard assist;ambulating;shower seat;rolling walker  Plan Discharge plan remains appropriate    Co-evaluation                 AM-PAC OT "6 Clicks" Daily Activity     Outcome Measure   Help from another person eating meals?: None Help from another person taking care of personal grooming?: A Little Help from another person toileting, which includes using toliet, bedpan, or urinal?: A Lot Help from another person bathing (including washing, rinsing, drying)?: A Little Help from another person to put on and taking off regular upper body clothing?: A Little Help from another person to put on and taking off regular lower body clothing?: A Lot 6 Click Score: 17    End of Session Equipment Utilized During Treatment: Rolling walker  OT Visit Diagnosis: Unsteadiness on feet (R26.81);Muscle weakness (generalized) (M62.81)   Activity Tolerance Patient tolerated treatment well;Patient limited by fatigue   Patient Left in chair;with call bell/phone within reach   Nurse Communication Mobility status        Time: 3875-6433 OT Time Calculation (min): 25 min  Charges: OT General Charges $OT Visit: 1 Visit OT Treatments $Self Care/Home Management : 23-37 mins  Lodema Hong, OTA   Gary Carroll 02/09/2021, 8:42 AM

## 2021-02-09 NOTE — Progress Notes (Signed)
Physical Therapy Treatment Patient Details Name: Gary Carroll MRN: [id number] DOB: [birth year] Today's Date: 02/09/2021   History of Present Illness 75 y.o. male admitted to [facility] 9/16 with sepsis, weakness, fatigue, body aches, and LE swelling. Pt with flash pulmonary edema in ED and Intubated. Transfer to [facility] 9/17. Extubated 9/18.  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 Session emphasized transfers and gait with pt requiring min A and min guard assist, respectively. Pt performed 3 sit to stands in 30s with min A and use of AD to balance and steady. Pt increased ambulation distance compared to previous session with verbal cueing for upright posture. Continue to recommend SNF given pt's functional status and decreased caregiver support. Will update based upon progression.   Recommendations for follow up therapy are one component of a multi-disciplinary discharge planning process, led by the attending physician.  Recommendations may be updated based on patient status, additional functional criteria and insurance authorization.  Follow Up Recommendations  SNF;Supervision for mobility/OOB     Equipment Recommendations  None recommended by PT    Recommendations for Other Services       Precautions / Restrictions Precautions Precautions: Fall Precaution Comments:  Restrictions Weight Bearing Restrictions: No     Mobility  Bed Mobility     Transfers Overall transfer level: Needs assistance Equipment used: Rolling walker (2 wheeled) Transfers: Sit to/from Stand Sit to Stand: Min assist         General transfer comment: physical assist with cues for hand placement to rise from chair. Repeated sit to stands in 30 sec x 3 with progression from min to minguard assist. Needs mod cues to control descent and correct hand placement. Total of 5 trials during  session  Ambulation/Gait Ambulation/Gait assistance: Min guard Gait Distance (Feet): 150 Feet Assistive device: Rolling walker (2 wheeled) Gait Pattern/deviations: Step-through pattern;Trunk flexed;Decreased stride length   Gait velocity interpretation: 1.31 - 2.62 ft/sec, indicative of limited community ambulator General Gait Details: cues for position in RW and safety. Pt maintains flexed posture. HR max 79 with gait today. Fatigued with gait. Attempted 2nd trials without RW as pt not convinced he needs it. With 20' in room pt constantly seeking environmental support for UE and educated for continued need for RW   Stairs             Wheelchair Mobility    Modified Rankin (Stroke Patients Only)       Balance Overall balance assessment: Needs assistance     Standing balance support: Bilateral upper extremity supported;During functional activity;Single extremity supported Standing balance-Leahy Scale: Poor Standing balance comment: Pt requires UE support with gait. Gait improved with BUE support on RW and demonstrated unsteady gait with uni UE support using assist from bed and sink "funiture walking"                            Cognition Arousal/Alertness: Awake/alert Behavior During Therapy: WFL for tasks assessed/performed Overall Cognitive Status: Within Functional Limits for tasks assessed                                 General Comments: demonstrated good safety      Exercises      General Comments General comments (skin integrity,  edema, etc.): HR 62-79 during session      Pertinent Vitals/Pain Pain Assessment: No/denies pain    Home Living                      Prior Function            PT Goals (current goals can now be found in the care plan section) Acute Rehab PT Goals Patient Stated Goal: [facility] Progress towards PT goals: Progressing toward goals    Frequency    Min 3X/week       PT Plan Current plan remains appropriate    Co-evaluation              AM-PAC PT "6 Clicks" Mobility   Outcome Measure  Help needed turning from your back to your side while in a flat bed without using bedrails?: A Little Help needed moving from lying on your back to sitting on the side of a flat bed without using bedrails?: A Little Help needed moving to and from a bed to a chair (including a wheelchair)?: A Little Help needed standing up from a chair using your arms (e.g., wheelchair or bedside chair)?: A Little Help needed to walk in hospital room?: A Little Help needed climbing 3-5 steps with a railing? : A Lot 6 Click Score: 17    End of Session Equipment Utilized During Treatment: Gait belt Activity Tolerance: Patient tolerated treatment well Patient left: in chair;with call bell/phone within reach;with chair alarm set Nurse Communication: Mobility status PT Visit Diagnosis: Other abnormalities of gait and mobility (R26.89);Unsteadiness on feet (R26.81);Muscle weakness (generalized) (M62.81)     Time: 0017-4944 PT Time Calculation (min) (ACUTE ONLY): 24 min  Charges:  $Gait Training: 23-37 mins                     Louie Casa, SPT Acute Rehab: [phone]    Domingo Dimes 02/09/2021, 9:26 AM

## 2021-02-10 DIAGNOSIS — J9601 Acute respiratory failure with hypoxia: Secondary | ICD-10-CM | POA: Diagnosis not present

## 2021-02-10 DIAGNOSIS — I480 Paroxysmal atrial fibrillation: Secondary | ICD-10-CM | POA: Diagnosis not present

## 2021-02-10 DIAGNOSIS — A419 Sepsis, unspecified organism: Secondary | ICD-10-CM | POA: Diagnosis not present

## 2021-02-10 DIAGNOSIS — N401 Enlarged prostate with lower urinary tract symptoms: Secondary | ICD-10-CM | POA: Diagnosis not present

## 2021-02-10 NOTE — Plan of Care (Signed)
  Problem: Nutrition: Goal: Adequate nutrition will be maintained Outcome: Progressing   Problem: Coping: Goal: Level of anxiety will decrease Outcome: Progressing   Problem: Elimination: Goal: Will not experience complications related to bowel motility Outcome: Progressing   Problem: Pain Managment: Goal: General experience of comfort will improve Outcome: Progressing   

## 2021-02-10 NOTE — Progress Notes (Addendum)
PROGRESS NOTE    Gary Carroll  HWE:[id number] DOB: 04/01/1946 DOA: 02/02/2021 PCP: Celene Squibb, MD    Brief Narrative:  Gary Carroll was admitted to the hospital with the working diagnosis of septic shock due to urine infection, complicated with respiratory failure due to acute pulmonary edema and atrial fibrillation with RVR.    75 year old male past medical history of hypertension, GERD, dyslipidemia, atrial fibrillation, and obesity class II who presented with fatigue, body aches, lower extremity edema and erythema.  Initially diagnosed with lower extremity cellulitis, received doxycycline and ceftriaxone and discharged home.  He was brought back to the emergency department by EMS due to altered mentation.  Apparently he was not in the pharmacy getting his prescriptions when EMS was called.  On his initial physical examination he was febrile 102.1 F, blood pressure 80/60, heart rate 81, respiratory rate 28, oxygen saturation 97%.  His lungs were clear to auscultation, heart S1-S2, present, tachycardic, abdomen was soft, nontender, his lower extremities were cool, positive edema and bilateral erythema.   Sodium 136, potassium 4.2, chloride 105, bicarb 23, glucose 135, BUN 22, creatinine 1.0, lactic acid 3.0-2.6, white count 16.7, hemoglobin 13.8, hematocrit 42.3, platelets 162. SARS COVID-19 negative.   Urinalysis, specific gravity 1.017, 6-10 red cells,> 50 white cells.   His chest radiograph had no infiltrates.   EKG 90 bpm, normal axis, normal intervals, sinus rhythm, V4-V6 ST depressions, no significant T wave changes.   Patient received aggressive volume resuscitation and broad-spectrum antibiotic therapy. Required vasopressors due to persistent shock. He developed pulmonary edema and required intubation with mechanical ventilation.   9/17 transfer [facility] to [facility] for further management.   9/18 vasopressors were discontinued and patient was liberated from mechanical  ventilation.   Transferred to [facility] 9/20.   02/08/21. Patient developed atrial fibrillation with rapid ventricular response, requiring diltiazem infusion.  Converted back to sinus rhythm.   Pending transfer to SNF  Assessment & Plan:   Principal Problem:   Septic shock ([place]) Active Problems:   Essential hypertension, benign   Erectile dysfunction   AF (paroxysmal atrial fibrillation) (HCC)   Sepsis due to cellulitis (HCC)   BPH (benign prostatic hyperplasia)   Sleep apnea   Acute respiratory failure with hypoxia and hypercapnia (HCC)   Acute pulmonary edema (HCC)   Pressure injury of skin   Septic shock due to urine infection, in the setting of chronic self catheterization/ urinary retention.   CT abdomen and pelvis from 09.16.22 with no hydronephrosis, prostate enlargement.    Patient has completed antibiotic therapy with ceftriaxone 5 doses.  Plan to transfer to SNF due to deconditioning and poor supervision at home. Patient lives alone.  Pending insurance authorization. He understands the need of therapy and would like SNF near his home in [place]   2. Acute hypoxemic respiratory failure due to acute diastolic heart failure, volume overload. HTN Echocardiogram with LV systolic function 55 to 89%.   Patient continue oxygenating well, no signs of volume overload, he does have chronic lower extremity edema and venous insufficiency.  Continue with oral furosemide 40 mg daily.  Out of bed to chair tid with meals.  Continue blood pressure control with losartan.    3. Paroxysmal atrial fibrillation.  Positive troponin elevation, likely demand ischemia from tachyarrhythmia.    His rate has remained well controlled with metoprolol 50 mg po bid, anticoagulation on apixaban. Will discontinue telemetry to allow better mobility, continue heart rate monitoring with vital signs.  4. Hypokalemia, non anion gap metabolic acidosis.  Continue K supplementing while on furosemide.   Check electrolytes in 48 hrs.    5. Dyslipidemia. Continue with statin therapy with rosuvastatin.      6. Peripheral vascular disease. Chronic lower extremity discoloration.    7. Obesity class 2. Stage 2 sacrum pressure ulcer present on admission.  Calculated BMI is 37.7.  Local skin care.    8. Pulmonary nodule.  Positive left apical nodular infiltrate, positive enlarged mediastinal, bilateral hilar, retroperitoneal, and mesenteric lymph nodes. Patient will follow up with Dr Lamonte Sakai from pulmonary as outpatient.     Status is: Inpatient  Remains inpatient appropriate because:Inpatient level of care appropriate due to severity of illness  Dispo: The patient is from: Home              Anticipated d/c is to: SNF              Patient currently is medically stable to d/c.   Difficult to place patient No   DVT prophylaxis:  Apixaban   Code Status:    full  Family Communication:   No family at the bedside       Skin Documentation: Pressure Injury 02/03/21 Sacrum Lower;Medial Stage 2 -  Partial thickness loss of dermis presenting as a shallow open injury with a red, pink wound bed without slough. (Active)  02/03/21 0330  Location: Sacrum  Location Orientation: Lower;Medial  Staging: Stage 2 -  Partial thickness loss of dermis presenting as a shallow open injury with a red, pink wound bed without slough.  Wound Description (Comments):   Present on Admission: Yes     Subjective: Patient with no nausea or vomiting, no chest pain or dyspnea, continue to be very weak and deconditioned, not yet back to his baseline.   Objective: Vitals:   02/09/21 2058 02/10/21 0028 02/10/21 0418 02/10/21 0706  BP: (!) 167/64  (!) 157/51 (!) 181/146  Pulse: 66 63 62 65  Resp: 16  16   Temp: 98.5 F (36.9 C)  97.7 F (36.5 C) 98.4 F (36.9 C)  TempSrc: Oral  Oral Axillary  SpO2: 100% 96% 94% 96%  Weight:      Height:        Intake/Output Summary (Last 24 hours) at 02/10/2021 6659 Last  data filed at 02/10/2021 0300 Gross per 24 hour  Intake --  Output 2350 ml  Net -2350 ml   Filed Weights   02/02/21 1658 02/03/21 0320  Weight: 121.6 kg 122.8 kg    Examination:   General: Not in pain or dyspnea,  Neurology: Awake and alert, non focal  E ENT: no pallor, no icterus, oral mucosa moist Cardiovascular: No JVD. S1-S2 present, rhythmic, no gallops, rubs, or murmurs. +++ non pitting bilateral lower extremity edema, more left than right.  Pulmonary: positive breath sounds bilaterally, adequate air movement, no wheezing, rhonchi or rales. Gastrointestinal. Abdomen soft and non tender Skin. Bilateral lower extremities discoloration.  Musculoskeletal: no joint deformities     Data Reviewed: I have personally reviewed following labs and imaging studies  CBC: Recent Labs  Lab 02/05/21 0037 02/06/21 0349 02/07/21 0244 02/08/21 0215 02/09/21 0248  WBC 8.4 7.1 10.7* 10.2 8.5  HGB 13.1 12.6* 12.8* 13.1 12.9*  HCT 40.3 39.6 39.8 40.6 40.9  MCV 90.2 90.6 89.6 88.6 90.7  PLT 133* 155 155 189 935   Basic Metabolic Panel: Recent Labs  Lab 02/04/21 0425 02/05/21 0037 02/06/21 0349 02/07/21 0244 02/08/21 0215 02/09/21  0248  NA 138 140 140 140 138 140  K 3.6 3.4* 3.4* 4.4 3.7 4.1  CL 106 106 110 110 108 110  CO2 22 22 20* 21* 20* 22  GLUCOSE 139* 105* 120* 122* 123* 124*  BUN 23 22 20 16 19 22   CREATININE 1.14 1.05 0.91 0.89 0.83 0.89  CALCIUM 8.5* 8.5* 8.3* 8.2* 8.4* 8.6*  MG 1.9 2.3  --   --  2.2  --   PHOS 2.8 3.1  --   --   --   --    GFR: Estimated Creatinine Clearance: 95.7 mL/min (by C-G formula based on SCr of 0.89 mg/dL). Liver Function Tests: No results for input(s): AST, ALT, ALKPHOS, BILITOT, PROT, ALBUMIN in the last 168 hours. No results for input(s): LIPASE, AMYLASE in the last 168 hours. No results for input(s): AMMONIA in the last 168 hours. Coagulation Profile: No results for input(s): INR, PROTIME in the last 168 hours. Cardiac  Enzymes: No results for input(s): CKTOTAL, CKMB, CKMBINDEX, TROPONINI in the last 168 hours. BNP (last 3 results) No results for input(s): PROBNP in the last 8760 hours. HbA1C: No results for input(s): HGBA1C in the last 72 hours. CBG: Recent Labs  Lab 02/05/21 0738 02/05/21 1127 02/05/21 1523 02/05/21 2146 02/06/21 0800  GLUCAP 109* 131* 121* 126* 103*   Lipid Profile: No results for input(s): CHOL, HDL, LDLCALC, TRIG, CHOLHDL, LDLDIRECT in the last 72 hours. Thyroid Function Tests: Recent Labs    02/08/21 0518  TSH 1.443   Anemia Panel: No results for input(s): VITAMINB12, FOLATE, FERRITIN, TIBC, IRON, RETICCTPCT in the last 72 hours.    Radiology Studies: I have reviewed all of the imaging during this hospital visit personally     Scheduled Meds:  apixaban  5 mg Oral BID   Chlorhexidine Gluconate Cloth  6 each Topical Q0600   docusate sodium  100 mg Oral BID   furosemide  40 mg Oral Daily   losartan  50 mg Oral Daily   metoprolol tartrate  25 mg Oral Once   metoprolol tartrate  50 mg Oral BID   polyethylene glycol  17 g Oral Daily   potassium chloride  10 mEq Oral Daily   rosuvastatin  20 mg Oral Daily   Continuous Infusions:  sodium chloride Stopped (02/05/21 1846)     LOS: 8 days        Lachina Salsberry Gerome Apley, MD

## 2021-02-11 DIAGNOSIS — J9601 Acute respiratory failure with hypoxia: Secondary | ICD-10-CM | POA: Diagnosis not present

## 2021-02-11 DIAGNOSIS — A419 Sepsis, unspecified organism: Secondary | ICD-10-CM | POA: Diagnosis not present

## 2021-02-11 DIAGNOSIS — J810 Acute pulmonary edema: Secondary | ICD-10-CM | POA: Diagnosis not present

## 2021-02-11 DIAGNOSIS — I480 Paroxysmal atrial fibrillation: Secondary | ICD-10-CM | POA: Diagnosis not present

## 2021-02-11 NOTE — Progress Notes (Signed)
Patient refused CPAP. Last worn 02/08/21. Equipment removed from room.

## 2021-02-11 NOTE — Progress Notes (Signed)
PROGRESS NOTE    Gary Carroll  JXB:[id number] DOB: Jan 17, 1946 DOA: 02/02/2021 PCP: Celene Squibb, MD    Brief Narrative:  Mr. Gary Carroll was admitted to the hospital with the working diagnosis of septic shock due to urine infection, complicated with respiratory failure due to acute pulmonary edema and atrial fibrillation with RVR.    75 year old male past medical history of hypertension, GERD, dyslipidemia, atrial fibrillation, and obesity class II who presented with fatigue, body aches, lower extremity edema and erythema.  Initially diagnosed with lower extremity cellulitis, received doxycycline and ceftriaxone and discharged home.  He was brought back to the emergency department by EMS due to altered mentation.  Apparently he was not in the pharmacy getting his prescriptions when EMS was called.  On his initial physical examination he was febrile 102.1 F, blood pressure 80/60, heart rate 81, respiratory rate 28, oxygen saturation 97%.  His lungs were clear to auscultation, heart S1-S2, present, tachycardic, abdomen was soft, nontender, his lower extremities were cool, positive edema and bilateral erythema.   Sodium 136, potassium 4.2, chloride 105, bicarb 23, glucose 135, BUN 22, creatinine 1.0, lactic acid 3.0-2.6, white count 16.7, hemoglobin 13.8, hematocrit 42.3, platelets 162. SARS COVID-19 negative.   Urinalysis, specific gravity 1.017, 6-10 red cells,> 50 white cells.   His chest radiograph had no infiltrates.   EKG 90 bpm, normal axis, normal intervals, sinus rhythm, V4-V6 ST depressions, no significant T wave changes.   Patient received aggressive volume resuscitation and broad-spectrum antibiotic therapy. Required vasopressors due to persistent shock. He developed pulmonary edema and required intubation with mechanical ventilation.   9/17 transfer from [facility] to [facility] for further management.   9/18 vasopressors were discontinued and patient was liberated from mechanical  ventilation.   Transferred to [facility] 9/20.   02/08/21. Patient developed atrial fibrillation with rapid ventricular response, requiring diltiazem infusion.  Converted back to sinus rhythm.    Completed antibiotic therapy.  Pending transfer to SNF   Assessment & Plan:   Principal Problem:   Septic shock ([place]) Active Problems:   Essential hypertension, benign   Erectile dysfunction   AF (paroxysmal atrial fibrillation) (HCC)   Sepsis due to cellulitis (HCC)   BPH (benign prostatic hyperplasia)   Sleep apnea   Acute respiratory failure with hypoxia and hypercapnia (HCC)   Acute pulmonary edema (HCC)   Pressure injury of skin   Septic shock due to urine infection, in the setting of chronic self catheterization/ urinary retention.   CT abdomen and pelvis from 09.16.22 with no hydronephrosis, prostate enlargement.    Completed antibiotic therapy. Patient very weak and deconditioned, pending transfer to SNF. Patient is medically stable.   2. Acute hypoxemic respiratory failure due to acute diastolic heart failure, volume overload. HTN Echocardiogram with LV systolic function 55 to 13%.    No clinical sings of exacerbation. Continue home regime with furosemide 40 mg daily.  On losartan for blood pressure control.    3. Paroxysmal atrial fibrillation.  Positive troponin elevation, likely demand ischemia from tachyarrhythmia.    Continue rate control with metoprolol and anticoagulation with apixaban. Now off telemetry monitoring.    4. Hypokalemia, non anion gap metabolic acidosis.  Stable renal function. Continue with K supplementation. Follow renal function as outpatient.    5. Dyslipidemia. On rosuvastatin.      6. Peripheral vascular disease. Chronic lower extremity discoloration and non pitting edema. Patient using compression stockings as outpatient.    7. Obesity class 2. Stage 2 sacrum pressure  ulcer present on admission.  Calculated BMI is 37.7.  Local skin care.     8. Pulmonary nodule.  Positive left apical nodular infiltrate, positive enlarged mediastinal, bilateral hilar, retroperitoneal, and mesenteric lymph nodes. Patient will follow up with Dr Lamonte Sakai from pulmonary as outpatient.     Status is: Inpatient  Remains inpatient appropriate because:Inpatient level of care appropriate due to severity of illness  Dispo: The patient is from: Home              Anticipated d/c is to: SNF              Patient currently is medically stable to d/c.   Difficult to place patient No   DVT prophylaxis: Apixaban   Code Status:    full  Family Communication:   No family at the bedside      Skin Documentation: Pressure Injury 02/03/21 Sacrum Lower;Medial Stage 2 -  Partial thickness loss of dermis presenting as a shallow open injury with a red, pink wound bed without slough. (Active)  02/03/21 0330  Location: Sacrum  Location Orientation: Lower;Medial  Staging: Stage 2 -  Partial thickness loss of dermis presenting as a shallow open injury with a red, pink wound bed without slough.  Wound Description (Comments):   Present on Admission: Yes     Subjective: Patient with no nausea or vomiting, no dyspnea or chest pain, tolerating po well. Continue to be very weak and deconditioned.   Objective: Vitals:   02/10/21 1150 02/10/21 2033 02/11/21 0416 02/11/21 0813  BP: (!) 154/36 (!) 149/52 (!) 146/90 122/81  Pulse: 60 63 79 66  Resp: 18 20 20 18   Temp: 98.5 F (36.9 C) 98.5 F (36.9 C) 98.4 F (36.9 C)   TempSrc: Oral Oral Oral   SpO2: 95%  93% 98%  Weight:      Height:        Intake/Output Summary (Last 24 hours) at 02/11/2021 1054 Last data filed at 02/11/2021 0900 Gross per 24 hour  Intake --  Output 2450 ml  Net -2450 ml   Filed Weights   02/02/21 1658 02/03/21 0320  Weight: 121.6 kg 122.8 kg    Examination:   General: Not in pain or dyspnea/.  Neurology: Awake and alert, non focal  E ENT: no pallor, no icterus, oral mucosa  moist Cardiovascular: No JVD. S1-S2 present, rhythmic, no gallops, rubs, or murmurs. ++ non pitting lower extremity edema. Pulmonary: positive breath sounds bilaterally, adequate air movement, no wheezing, rhonchi or rales. Gastrointestinal. Abdomen soft and non tender Skin. Lower extremities discoloration  Musculoskeletal: no joint deformities     Data Reviewed: I have personally reviewed following labs and imaging studies  CBC: Recent Labs  Lab 02/05/21 0037 02/06/21 0349 02/07/21 0244 02/08/21 0215 02/09/21 0248  WBC 8.4 7.1 10.7* 10.2 8.5  HGB 13.1 12.6* 12.8* 13.1 12.9*  HCT 40.3 39.6 39.8 40.6 40.9  MCV 90.2 90.6 89.6 88.6 90.7  PLT 133* 155 155 189 426   Basic Metabolic Panel: Recent Labs  Lab 02/05/21 0037 02/06/21 0349 02/07/21 0244 02/08/21 0215 02/09/21 0248  NA 140 140 140 138 140  K 3.4* 3.4* 4.4 3.7 4.1  CL 106 110 110 108 110  CO2 22 20* 21* 20* 22  GLUCOSE 105* 120* 122* 123* 124*  BUN 22 20 16 19 22   CREATININE 1.05 0.91 0.89 0.83 0.89  CALCIUM 8.5* 8.3* 8.2* 8.4* 8.6*  MG 2.3  --   --  2.2  --  PHOS 3.1  --   --   --   --    GFR: Estimated Creatinine Clearance: 95.7 mL/min (by C-G formula based on SCr of 0.89 mg/dL). Liver Function Tests: No results for input(s): AST, ALT, ALKPHOS, BILITOT, PROT, ALBUMIN in the last 168 hours. No results for input(s): LIPASE, AMYLASE in the last 168 hours. No results for input(s): AMMONIA in the last 168 hours. Coagulation Profile: No results for input(s): INR, PROTIME in the last 168 hours. Cardiac Enzymes: No results for input(s): CKTOTAL, CKMB, CKMBINDEX, TROPONINI in the last 168 hours. BNP (last 3 results) No results for input(s): PROBNP in the last 8760 hours. HbA1C: No results for input(s): HGBA1C in the last 72 hours. CBG: Recent Labs  Lab 02/05/21 0738 02/05/21 1127 02/05/21 1523 02/05/21 2146 02/06/21 0800  GLUCAP 109* 131* 121* 126* 103*   Lipid Profile: No results for input(s): CHOL,  HDL, LDLCALC, TRIG, CHOLHDL, LDLDIRECT in the last 72 hours. Thyroid Function Tests: No results for input(s): TSH, T4TOTAL, FREET4, T3FREE, THYROIDAB in the last 72 hours. Anemia Panel: No results for input(s): VITAMINB12, FOLATE, FERRITIN, TIBC, IRON, RETICCTPCT in the last 72 hours.    Radiology Studies: I have reviewed all of the imaging during this hospital visit personally     Scheduled Meds:  apixaban  5 mg Oral BID   Chlorhexidine Gluconate Cloth  6 each Topical Q0600   docusate sodium  100 mg Oral BID   furosemide  40 mg Oral Daily   losartan  50 mg Oral Daily   metoprolol tartrate  25 mg Oral Once   metoprolol tartrate  50 mg Oral BID   polyethylene glycol  17 g Oral Daily   potassium chloride  10 mEq Oral Daily   rosuvastatin  20 mg Oral Daily   Continuous Infusions:  sodium chloride Stopped (02/05/21 1846)     LOS: 9 days        Dezerae Freiberger Gerome Apley, MD

## 2021-02-11 NOTE — TOC Progression Note (Signed)
Transition of Care [facility]) - Progression Note    Patient Details  Name: Gary Carroll MRN: [id number] Date of Birth: July 02, 1945  Transition of Care [facility]) CM/SW [place], [place] Phone Number: 02/11/2021, 10:29 AM  Clinical Narrative:    CSW spoke with pt at bedside to provide his bed offer for [facility] rehab [facility]). Pt is agreeable, but noted he would like to go home before going. CSW advised that pt will be unable to do that and together made a plan to take care of the things he needs to take care of. CSW answered all questions. CSW left a voicemail for facility notifying them they will need to start authorization. Pt's insurance takes an extended time to be approved, TOC will continue to follow.   Expected Discharge Plan: IP Rehab Facility Barriers to Discharge: Continued Medical Work up  Expected Discharge Plan and Services Expected Discharge Plan: [place] In-house Referral: NA Discharge Planning Services: CM Consult Post Acute Care Choice: NA Living arrangements for the past 2 months: Apartment                 DME Arranged: N/A DME Agency: NA       HH Arranged: NA HH Agency: NA         Social Determinants of Health (SDOH) Interventions    Readmission Risk Interventions Readmission Risk Prevention Plan 02/07/2021  Transportation Screening Complete  PCP or Specialist Appt within 5-7 Days Complete  Home Care Screening Complete  Medication Review (RN CM) Referral to Pharmacy  Some recent data might be hidden

## 2021-02-12 DIAGNOSIS — J9601 Acute respiratory failure with hypoxia: Secondary | ICD-10-CM | POA: Diagnosis not present

## 2021-02-12 DIAGNOSIS — J810 Acute pulmonary edema: Secondary | ICD-10-CM | POA: Diagnosis not present

## 2021-02-12 DIAGNOSIS — I480 Paroxysmal atrial fibrillation: Secondary | ICD-10-CM | POA: Diagnosis not present

## 2021-02-12 DIAGNOSIS — A419 Sepsis, unspecified organism: Secondary | ICD-10-CM | POA: Diagnosis not present

## 2021-02-12 NOTE — Progress Notes (Addendum)
Physical Therapy Treatment Patient Details Name: Gary Carroll MRN: [id number] DOB: 1945-12-01 Today's Date: 02/12/2021   History of Present Illness 75 y.o. male admitted to [facility] 9/16 with sepsis, weakness, fatigue, body aches, and LE swelling. Pt with flash pulmonary edema in ED and Intubated. Transfer to [facility] 9/17. Extubated 9/18.  PMH: HTN, HLD, OSA, GERD, HLD, AKI, Afib, and staph infection to Bil LE    PT Comments    Pt agreeable to exercise and gait in the hallway.   Emphasis first on strengthening exercise then progression of gait stability and stamina with the RW.  Pt shows improvements overall, but still fatiguing fairly easily.     Recommendations for follow up therapy are one component of a multi-disciplinary discharge planning process, led by the attending physician.  Recommendations may be updated based on patient status, additional functional criteria and insurance authorization.  Follow Up Recommendations  SNF;Supervision for mobility/OOB     Equipment Recommendations  None recommended by PT    Recommendations for Other Services       Precautions / Restrictions Precautions Precautions: Fall Precaution Comments: watch HR     Mobility  Bed Mobility Overal bed mobility: Needs Assistance Bed Mobility: Sit to Supine     Supine to sit: Supervision     General bed mobility comments: used bed rails    Transfers Overall transfer level: Needs assistance Equipment used: Rolling walker (2 wheeled) Transfers: Sit to/from Stand Sit to Stand: Supervision            Ambulation/Gait Ambulation/Gait assistance: Min guard Gait Distance (Feet): 160 Feet Assistive device: Rolling walker (2 wheeled) Gait Pattern/deviations: Step-through pattern;Trunk flexed;Decreased stride length Gait velocity: decreased Gait velocity interpretation: <1.8 ft/sec, indicate of risk for recurrent falls General Gait Details: cues for proximity to the RW and postural checks.   generally steady gait, slow, guarded cadence.   Stairs             Wheelchair Mobility    Modified Rankin (Stroke Patients Only)       Balance Overall balance assessment: Needs assistance Sitting-balance support: No upper extremity supported;Feet supported Sitting balance-Leahy Scale: Fair     Standing balance support: No upper extremity supported;During functional activity Standing balance-Leahy Scale: Fair Standing balance comment: transitioned between standing exercise without need to use AD                            Cognition Arousal/Alertness: Awake/alert Behavior During Therapy: WFL for tasks assessed/performed Overall Cognitive Status: Within Functional Limits for tasks assessed                                 General Comments: demonstrated good safety      Exercises General Exercises - Lower Extremity Hip ABduction/ADduction: AROM;Strengthening;Both;10 reps;Standing Hip Flexion/Marching: AROM;Strengthening;Both;10 reps;Standing Mini-Sqauts: AROM;10 reps;Standing    General Comments        Pertinent Vitals/Pain Pain Assessment: No/denies pain    Home Living                      Prior Function            PT Goals (current goals can now be found in the care plan section) Acute Rehab PT Goals Patient Stated Goal: [facility] PT Goal Formulation: With patient Time For Goal Achievement: 02/20/21 Potential to Achieve Goals:  Fair Progress towards PT goals: Progressing toward goals    Frequency    Min 3X/week      PT Plan Current plan remains appropriate    Co-evaluation              AM-PAC PT "6 Clicks" Mobility   Outcome Measure  Help needed turning from your back to your side while in a flat bed without using bedrails?: A Little Help needed moving from lying on your back to sitting on the side of a flat bed without using bedrails?: A Little Help needed moving to and from a bed  to a chair (including a wheelchair)?: A Little Help needed standing up from a chair using your arms (e.g., wheelchair or bedside chair)?: A Little Help needed to walk in hospital room?: A Little Help needed climbing 3-5 steps with a railing? : A Lot 6 Click Score: 17    End of Session   Activity Tolerance: Patient tolerated treatment well Patient left: in chair;with call bell/phone within reach;with chair alarm set Nurse Communication: Mobility status PT Visit Diagnosis: Other abnormalities of gait and mobility (R26.89);Unsteadiness on feet (R26.81);Muscle weakness (generalized) (M62.81)     Time: 5400-8676 PT Time Calculation (min) (ACUTE ONLY): 18 min  Charges:  $Gait Training: 8-22 mins                     02/12/2021  Ginger Carne., PT Acute Rehabilitation Services [phone]  (pager) [phone]  (office)   Gary Carroll 02/12/2021, 6:14 PM

## 2021-02-12 NOTE — TOC Progression Note (Addendum)
Transition of Care [facility]) - Progression Note    Patient Details  Name: Gary Carroll MRN: [id number] Date of Birth: January 08, 1946  Transition of Care [facility]) CM/SW [place], RN Phone Number:[phone]  02/12/2021, 12:50 PM  Clinical Narrative:    CM spoke with Bryson Ha at [facility] in [place]. Bryson Ha states that facility will start auth but needs updated therapy notes. CM will send message to PT/OT assigned to the patient. TOC will continue to follow.  1330 CM spoke with PT and they will try to assign someone to see patient today to update notes.   Expected Discharge Plan: IP Rehab Facility Barriers to Discharge: Continued Medical Work up  Expected Discharge Plan and Services Expected Discharge Plan: [place] In-house Referral: NA Discharge Planning Services: CM Consult Post Acute Care Choice: NA Living arrangements for the past 2 months: Apartment                 DME Arranged: N/A DME Agency: NA       HH Arranged: NA HH Agency: NA         Social Determinants of Health (SDOH) Interventions    Readmission Risk Interventions Readmission Risk Prevention Plan 02/07/2021  Transportation Screening Complete  PCP or Specialist Appt within 5-7 Days Complete  Home Care Screening Complete  Medication Review (RN CM) Referral to Pharmacy  Some recent data might be hidden

## 2021-02-12 NOTE — Plan of Care (Signed)
  Problem: Nutrition: Goal: Adequate nutrition will be maintained Outcome: Progressing   Problem: Elimination: Goal: Will not experience complications related to bowel motility Outcome: Progressing   Problem: Pain Managment: Goal: General experience of comfort will improve Outcome: Progressing   

## 2021-02-12 NOTE — Progress Notes (Signed)
Occupational Therapy Treatment Patient Details Name: Gary Carroll MRN: [id number] DOB: [birth year] Today's Date: 02/12/2021   History of present illness 75 y.o. male admitted to [facility] 9/16 with sepsis, weakness, fatigue, body aches, and LE swelling. Pt with flash pulmonary edema in ED and Intubated. Transfer to [facility] 9/17. Extubated 9/18.  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 Patient ambulated to sink to perform grooming with supervision.  Shower transfer performed into walk in shower with 6 inch lip to step over with supervision and vcs to use rails.  Footwear doffed and donned with patient using towel to assist with crossing legs.  Acute OT to continue to follow.    Recommendations for follow up therapy are one component of a multi-disciplinary discharge planning process, led by the attending physician.  Recommendations may be updated based on patient status, additional functional criteria and insurance authorization.    Follow Up Recommendations  SNF;Supervision/Assistance - 24 hour    Equipment Recommendations       Recommendations for Other Services      Precautions / Restrictions Precautions Precautions: Fall Precaution Comments: watch HR       Mobility Bed Mobility Overal bed mobility: Needs Assistance Bed Mobility: Sit to Supine     Supine to sit: Supervision     General bed mobility comments: used bed rails    Transfers Overall transfer level: Needs assistance Equipment used: Rolling walker (2 wheeled) Transfers: Sit to/from Stand Sit to Stand: Supervision         General transfer comment: used rails for shower transfers    Balance Overall balance assessment: Needs assistance Sitting-balance support: No upper extremity supported;Feet supported Sitting balance-Leahy Scale: Fair     Standing balance support: No upper extremity supported;During functional activity Standing  balance-Leahy Scale: Fair Standing balance comment: performed grooming standing at sink without UE support                           ADL either performed or assessed with clinical judgement   ADL Overall ADL's : Needs assistance/impaired     Grooming: Wash/dry hands;Wash/dry face;Oral care;Brushing hair;Standing;Supervision/safety               Lower Body Dressing: Sitting/lateral leans;Supervision/safety Lower Body Dressing Details (indicate cue type and reason): donned footwear         Tub/ Shower Transfer: Walk-in shower;Supervision/safety;Grab bars Clinical cytogeneticist Details (indicate cue type and reason): vcs for grab bar use and safety Functional mobility during ADLs: Supervision/safety;Rolling walker General ADL Comments: RW with vcs for mobility     Vision       Perception     Praxis      Cognition Arousal/Alertness: Awake/alert Behavior During Therapy: WFL for tasks assessed/performed Overall Cognitive Status: Within Functional Limits for tasks assessed                                 General Comments: demonstrated good safety        Exercises     Shoulder Instructions       General Comments      Pertinent Vitals/ Pain       Pain Assessment: No/denies pain  Home Living  Prior Functioning/Environment              Frequency  Min 2X/week        Progress Toward Goals  OT Goals(current goals can now be found in the care plan section)  Progress towards OT goals: Progressing toward goals  Acute Rehab OT Goals Patient Stated Goal: [facility] Goal Formulation: With patient Time For Goal Achievement: 02/21/21 Potential to Achieve Goals: Good ADL Goals Pt Will Perform Grooming: with modified independence;standing Pt Will Perform Upper Body Dressing: with modified independence;sitting Pt Will Perform Lower Body Dressing: with  modified independence;sit to/from stand Pt Will Transfer to Toilet: with modified independence;ambulating Pt Will Perform Toileting - Clothing Manipulation and hygiene: with modified independence;sitting/lateral leans;sit to/from stand Pt Will Perform Tub/Shower Transfer: Tub transfer;with min guard assist;ambulating;shower seat;rolling walker  Plan Discharge plan remains appropriate    Co-evaluation                 AM-PAC OT "6 Clicks" Daily Activity     Outcome Measure   Help from another person eating meals?: None Help from another person taking care of personal grooming?: A Little Help from another person toileting, which includes using toliet, bedpan, or urinal?: A Little Help from another person bathing (including washing, rinsing, drying)?: A Little Help from another person to put on and taking off regular upper body clothing?: A Little Help from another person to put on and taking off regular lower body clothing?: A Little 6 Click Score: 19    End of Session Equipment Utilized During Treatment: Rolling walker  OT Visit Diagnosis: Unsteadiness on feet (R26.81);Muscle weakness (generalized) (M62.81)   Activity Tolerance Patient tolerated treatment well;Patient limited by fatigue   Patient Left with call bell/phone within reach;in bed   Nurse Communication Mobility status        Time: 1410-1437 OT Time Calculation (min): 27 min  Charges: OT General Charges $OT Visit: 1 Visit OT Treatments $Self Care/Home Management : 23-37 mins  Lodema Hong, [place] 02/12/2021, 2:45 PM

## 2021-02-12 NOTE — Progress Notes (Signed)
Patient refused

## 2021-02-12 NOTE — Progress Notes (Signed)
PROGRESS NOTE    Gary Carroll  JME:[id number] DOB: 01-14-1946 DOA: 02/02/2021 PCP: Celene Squibb, MD    Brief Narrative:  Gary Carroll was admitted to the hospital with the working diagnosis of septic shock due to urine infection, complicated with respiratory failure due to acute pulmonary edema and atrial fibrillation with RVR.    75 year old male past medical history of hypertension, GERD, dyslipidemia, atrial fibrillation, and obesity class II who presented with fatigue, body aches, lower extremity edema and erythema.  Initially diagnosed with lower extremity cellulitis, received doxycycline and ceftriaxone and discharged home.  He was brought back to the emergency department by EMS due to altered mentation.  Apparently he was not in the pharmacy getting his prescriptions when EMS was called.  On his initial physical examination he was febrile 102.1 F, blood pressure 80/60, heart rate 81, respiratory rate 28, oxygen saturation 97%.  His lungs were clear to auscultation, heart S1-S2, present, tachycardic, abdomen was soft, nontender, his lower extremities were cool, positive edema and bilateral erythema.   Sodium 136, potassium 4.2, chloride 105, bicarb 23, glucose 135, BUN 22, creatinine 1.0, lactic acid 3.0-2.6, white count 16.7, hemoglobin 13.8, hematocrit 42.3, platelets 162. SARS COVID-19 negative.   Urinalysis, specific gravity 1.017, 6-10 red cells,> 50 white cells.   His chest radiograph had no infiltrates.   EKG 90 bpm, normal axis, normal intervals, sinus rhythm, V4-V6 ST depressions, no significant T wave changes.   Patient received aggressive volume resuscitation and broad-spectrum antibiotic therapy. Required vasopressors due to persistent shock. He developed pulmonary edema and required intubation with mechanical ventilation.   9/17 transfer from [facility] to [facility] for further management.   9/18 vasopressors were discontinued and patient was liberated from mechanical  ventilation.   Transferred to [facility] 9/20.   02/08/21. Patient developed atrial fibrillation with rapid ventricular response, requiring diltiazem infusion.  Converted back to sinus rhythm.   Completed antibiotic therapy.  Pending transfer to SNF, insurance authorization.   Assessment & Plan:   Principal Problem:   Septic shock ([place]) Active Problems:   Essential hypertension, benign   Erectile dysfunction   AF (paroxysmal atrial fibrillation) (HCC)   Sepsis due to cellulitis (HCC)   BPH (benign prostatic hyperplasia)   Sleep apnea   Acute respiratory failure with hypoxia and hypercapnia (HCC)   Acute pulmonary edema (HCC)   Pressure injury of skin   Septic shock due to urine infection, in the setting of chronic self catheterization/ urinary retention.   CT abdomen and pelvis from 09.16.22 with no hydronephrosis, prostate enlargement.    Sepsis due to urine infection has resolved. Completed antibiotic therapy. Patient very weak and deconditioned, pending transfer to SNF for further therapy, pending insurance authorization.    2. Acute hypoxemic respiratory failure due to acute diastolic heart failure, volume overload. HTN Echocardiogram with LV systolic function 55 to 22%.    Continue with losartan for blood pressure control and furosemide for diuresis. No acute exacerbation today.     3. Paroxysmal atrial fibrillation.  Positive troponin elevation, likely demand ischemia from tachyarrhythmia.    Tolerating well metoprolol 50 mg po bid for rate control, continue anticoagulation with apixaban.  Telemetry has been discontinued.    4. Hypokalemia, non anion gap metabolic acidosis.  Continue diuresis and KCL supplementation. He has chronic lower extremity edema due to venous insufficiency.    5. Dyslipidemia. Continue with rosuvastatin.      6. Peripheral vascular disease. Positive chronic lower extremity discoloration and non pitting  edema. To resume compression stockings as  outpatient.    7. Obesity class 2. Stage 2 sacrum pressure ulcer present on admission.  Calculated BMI is 37.7.  Continue PT and OT for mobility.    8. Pulmonary nodule.  Positive left apical nodular infiltrate, positive enlarged mediastinal, bilateral hilar, retroperitoneal, and mesenteric lymph nodes. Patient will follow up with Dr Lamonte Sakai from pulmonary as outpatient.    Status is: Inpatient  Remains inpatient appropriate because:Inpatient level of care appropriate due to severity of illness  Dispo: The patient is from: Home              Anticipated d/c is to: SNF              Patient currently is medically stable to d/c. Pending insurance authorization    Difficult to place patient No  DVT prophylaxis: Apixaban   Code Status:    full  Family Communication:  No family at the bedside      Nutrition Status:           Skin Documentation: Pressure Injury 02/03/21 Sacrum Lower;Medial Stage 2 -  Partial thickness loss of dermis presenting as a shallow open injury with a red, pink wound bed without slough. (Active)  02/03/21 0330  Location: Sacrum  Location Orientation: Lower;Medial  Staging: Stage 2 -  Partial thickness loss of dermis presenting as a shallow open injury with a red, pink wound bed without slough.  Wound Description (Comments):   Present on Admission: Yes      Subjective: Patient with no nausea or vomiting, no chest pain or dyspnea, continue to be very weak and deconditioned, not back to his baseline.   Objective: Vitals:   02/11/21 0813 02/11/21 1152 02/11/21 2028 02/12/21 0731  BP: 122/81 122/63 118/66 (!) 134/54  Pulse: 66 60 64 69  Resp: 18 18 20 18   Temp:  98.2 F (36.8 C) 98.5 F (36.9 C) 98.4 F (36.9 C)  TempSrc:  Oral Oral   SpO2: 98% 93% 95% 93%  Weight:      Height:        Intake/Output Summary (Last 24 hours) at 02/12/2021 1209 Last data filed at 02/11/2021 1722 Gross per 24 hour  Intake --  Output 500 ml  Net -500 ml   Filed  Weights   02/02/21 1658 02/03/21 0320  Weight: 121.6 kg 122.8 kg    Examination:   General: Not in pain or dyspnea, deconditioned  Neurology: Awake and alert, non focal  E ENT: no pallor, no icterus, oral mucosa moist Cardiovascular: No JVD. S1-S2 present, rhythmic, no gallops, rubs, or murmurs. ++ non pitting bilateral lower extremity edema. Pulmonary: positive breath sounds bilaterally, adequate air movement, no wheezing, rhonchi or rales. Gastrointestinal. Abdomen protuberant, soft and non tender Skin. Positive discoloration bilateral lower extremities.  Musculoskeletal: no joint deformities     Data Reviewed: I have personally reviewed following labs and imaging studies  CBC: Recent Labs  Lab 02/06/21 0349 02/07/21 0244 02/08/21 0215 02/09/21 0248  WBC 7.1 10.7* 10.2 8.5  HGB 12.6* 12.8* 13.1 12.9*  HCT 39.6 39.8 40.6 40.9  MCV 90.6 89.6 88.6 90.7  PLT 155 155 189 277   Basic Metabolic Panel: Recent Labs  Lab 02/06/21 0349 02/07/21 0244 02/08/21 0215 02/09/21 0248  NA 140 140 138 140  K 3.4* 4.4 3.7 4.1  CL 110 110 108 110  CO2 20* 21* 20* 22  GLUCOSE 120* 122* 123* 124*  BUN 20 16 19  22  CREATININE 0.91 0.89 0.83 0.89  CALCIUM 8.3* 8.2* 8.4* 8.6*  MG  --   --  2.2  --    GFR: Estimated Creatinine Clearance: 95.7 mL/min (by C-G formula based on SCr of 0.89 mg/dL). Liver Function Tests: No results for input(s): AST, ALT, ALKPHOS, BILITOT, PROT, ALBUMIN in the last 168 hours. No results for input(s): LIPASE, AMYLASE in the last 168 hours. No results for input(s): AMMONIA in the last 168 hours. Coagulation Profile: No results for input(s): INR, PROTIME in the last 168 hours. Cardiac Enzymes: No results for input(s): CKTOTAL, CKMB, CKMBINDEX, TROPONINI in the last 168 hours. BNP (last 3 results) No results for input(s): PROBNP in the last 8760 hours. HbA1C: No results for input(s): HGBA1C in the last 72 hours. CBG: Recent Labs  Lab 02/05/21 1523  02/05/21 2146 02/06/21 0800  GLUCAP 121* 126* 103*   Lipid Profile: No results for input(s): CHOL, HDL, LDLCALC, TRIG, CHOLHDL, LDLDIRECT in the last 72 hours. Thyroid Function Tests: No results for input(s): TSH, T4TOTAL, FREET4, T3FREE, THYROIDAB in the last 72 hours. Anemia Panel: No results for input(s): VITAMINB12, FOLATE, FERRITIN, TIBC, IRON, RETICCTPCT in the last 72 hours.    Radiology Studies: I have reviewed all of the imaging during this hospital visit personally     Scheduled Meds:  apixaban  5 mg Oral BID   Chlorhexidine Gluconate Cloth  6 each Topical Q0600   docusate sodium  100 mg Oral BID   furosemide  40 mg Oral Daily   losartan  50 mg Oral Daily   metoprolol tartrate  25 mg Oral Once   metoprolol tartrate  50 mg Oral BID   polyethylene glycol  17 g Oral Daily   potassium chloride  10 mEq Oral Daily   rosuvastatin  20 mg Oral Daily   Continuous Infusions:  sodium chloride Stopped (02/05/21 1846)     LOS: 10 days        Enyah Moman Gerome Apley, MD

## 2021-02-13 DIAGNOSIS — N401 Enlarged prostate with lower urinary tract symptoms: Secondary | ICD-10-CM | POA: Diagnosis not present

## 2021-02-13 DIAGNOSIS — I480 Paroxysmal atrial fibrillation: Secondary | ICD-10-CM | POA: Diagnosis not present

## 2021-02-13 DIAGNOSIS — A419 Sepsis, unspecified organism: Secondary | ICD-10-CM | POA: Diagnosis not present

## 2021-02-13 DIAGNOSIS — J9601 Acute respiratory failure with hypoxia: Secondary | ICD-10-CM | POA: Diagnosis not present

## 2021-02-13 MED ORDER — METOPROLOL TARTRATE 25 MG PO TABS
25.0000 mg | ORAL_TABLET | Freq: Two times a day (BID) | ORAL | Status: DC
  Administered 2021-02-13 – 2021-02-16 (×6): 25 mg via ORAL
  Filled 2021-02-13 (×6): qty 1

## 2021-02-13 NOTE — TOC Progression Note (Signed)
Transition of Care [facility]) - Progression Note    Patient Details  Name: Gary Carroll MRN: [id number] Date of Birth: 1946-05-13  Transition of Care [facility]) CM/SW [place], RN Phone Number:[phone]  02/13/2021, 1:00 PM  Clinical Narrative:    CM spoke with Bryson Ha at [facility]. Insurance Josem Kaufmann has been initiated. Awaiting approval   Expected Discharge Plan: IP Rehab Facility Barriers to Discharge: Continued Medical Work up  Expected Discharge Plan and Services Expected Discharge Plan: [place] In-house Referral: NA Discharge Planning Services: CM Consult Post Acute Care Choice: NA Living arrangements for the past 2 months: Apartment                 DME Arranged: N/A DME Agency: NA       HH Arranged: NA HH Agency: NA         Social Determinants of Health (SDOH) Interventions    Readmission Risk Interventions Readmission Risk Prevention Plan 02/07/2021  Transportation Screening Complete  PCP or Specialist Appt within 5-7 Days Complete  Home Care Screening Complete  Medication Review (RN CM) Referral to Pharmacy  Some recent data might be hidden

## 2021-02-13 NOTE — Discharge Summary (Signed)
Physician Discharge Summary  Gary Carroll VEL:[id number] DOB: March 29, 1946 DOA: 02/02/2021  PCP: Celene Squibb, MD  Admit date: 02/02/2021 Discharge date: 02/13/2021  Admitted From: Home  Disposition:   SNF   Recommendations for Outpatient Follow-up and new medication changes:  Follow up with Dr Nevada Crane in 7 to 10 days. Follow up renal function in 7 days. Increased metoprolol from 25 mg bid to 50 mg bid   Home Health: na   Equipment/Devices: na    Discharge Condition: stable CODE STATUS: full Diet recommendation:  heart healthy   Brief/Interim Summary: Gary Carroll was admitted to the hospital with the working diagnosis of septic shock due to urine infection, complicated with respiratory failure due to acute pulmonary edema and atrial fibrillation with RVR.    75 year old male past medical history of hypertension, GERD, dyslipidemia, atrial fibrillation, and obesity class II who presented with fatigue, body aches, lower extremity edema and erythema.  Initially diagnosed with lower extremity cellulitis, received doxycycline and ceftriaxone and discharged home. The same day he was brought back to the emergency department by EMS due to altered mentation.  Apparently he was in the pharmacy getting his prescriptions when EMS was called.  On his initial physical examination he was febrile 102.1 F, blood pressure 80/60, heart rate 81, respiratory rate 28, oxygen saturation 97%.  His lungs were clear to auscultation, heart S1-S2, present, tachycardic, abdomen was soft, nontender, his lower extremities were cool, positive edema and bilateral erythema.   Sodium 136, potassium 4.2, chloride 105, bicarb 23, glucose 135, BUN 22, creatinine 1.0, lactic acid 3.0-2.6, white count 16.7, hemoglobin 13.8, hematocrit 42.3, platelets 162. SARS COVID-19 negative.   Urinalysis, specific gravity 1.017, 6-10 red cells,> 50 white cells.   His chest radiograph had no infiltrates.   EKG 90 bpm, normal axis,  normal intervals, sinus rhythm, V4-V6 ST depressions, no significant T wave changes.   Patient received aggressive volume resuscitation and broad-spectrum antibiotic therapy. Required vasopressors due to persistent shock. He developed pulmonary edema and required intubation with mechanical ventilation.   9/17 transfer from [facility] to [facility] for further management.   9/18 vasopressors were discontinued and patient was liberated from mechanical ventilation.   Transferred to [facility] 9/20.   02/08/21. Patient developed atrial fibrillation with rapid ventricular response, requiring diltiazem infusion.  Converted back to sinus rhythm.    Completed antibiotic therapy.  Pending transfer to [facility] for insurance authorization.  Septic shock due to urine infection, in the setting of chronic self-catheterization, urinary retention. Patient was initially admitted to the intensive care unit, he received vasopressors and broad-spectrum antibiotic therapy. Required invasive mechanical ventilation.  His hemodynamics improved and he was successfully weaned off vasopressors and liberated from mechanical ventilation. His cultures remain no growth. CT of the abdomen pelvis performed 02/02/2021 showed no hydronephrosis or gross enlargement.   No signs of urinary retention. Patient completed antibiotic therapy during his hospitalization. Patient very weak and deconditioned, physical therapy/Occupational Therapy have recommended skilled nursing facility.  2.  Acute hypoxic respiratory failure due to acute diastolic heart failure decompensation, volume overload.  Hypertension. Patient was diuresed successfully with intravenous furosemide. Negative fluid balance was achieved. Further work-up with echocardiography showed LV systolic function 55 to 25%. Patient continue taking losartan and furosemide. At discharge patient will resume losartan 50 mg daily.   3.  Paroxysmal atrial fibrillation.  During his  hospitalization he developed rapid ventricular response related to atrial fibrillation. He required intravenous diltiazem infusion converting to sinus rhythm. His metoprolol was  increased from 25 to 50 mg twice daily, over last 24 hrs HR in the 55 range. Changed metoprolol back to 25 mg po bid. Continue anticoagulation with apixaban. Telemetry has been discontinued.   4.  Hypokalemia, non-anion gap metabolic acidosis.  His kidney function was closely monitored during his hospitalization. He received potassium chloride for electrolyte correction. At his discharge sodium 140, potassium 4.1, chloride 110, bicarb 22, glucose 124, BUN 22, creatinine 0.89.  5.  Dyslipidemia.  Continue rosuvastatin.  6.  Peripheral vascular disease.  Patient has chronic discoloration of lower extremities, nonpitting edema.  Venous insufficiency. At home uses compression stockings.  7.  Obesity class II, stage II sacral pressure ulcer, present on admission. Calculated BMI 37.7, continue local skin care.  8. Pulmonary nodule.  Positive left apical nodular infiltrate, positive enlarged mediastinal, bilateral hilar, retroperitoneal, and mesenteric lymph nodes. Patient will follow up with Dr Lamonte Sakai from pulmonary as outpatient.   Discharge Diagnoses:  Principal Problem:   Septic shock ([place]) Active Problems:   Essential hypertension, benign   Erectile dysfunction   AF (paroxysmal atrial fibrillation) (HCC)   Sepsis due to cellulitis (HCC)   BPH (benign prostatic hyperplasia)   Sleep apnea   Acute respiratory failure with hypoxia and hypercapnia (HCC)   Acute pulmonary edema (HCC)   Pressure injury of skin    Discharge Instructions   Allergies as of 02/13/2021       Reactions   Diovan [valsartan] Cough   Tagamet [cimetidine] Other (See Comments)   irrittability        Medication List     STOP taking these medications    ciprofloxacin-dexamethasone OTIC suspension Commonly known as: Ciprodex    doxycycline 100 MG capsule Commonly known as: VIBRAMYCIN   fluticasone 50 MCG/ACT nasal spray Commonly known as: FLONASE   Potassium Gluconate 2.5 MEQ Tabs   pravastatin 40 MG tablet Commonly known as: PRAVACHOL       TAKE these medications    apixaban 5 MG Tabs tablet Commonly known as: ELIQUIS Take 1 tablet (5 mg total) by mouth 2 (two) times daily.   CALCIUM 500 +D PO Take by mouth.   diphenhydrAMINE 25 MG tablet Commonly known as: BENADRYL Take 50 mg by mouth at bedtime as needed for sleep.   Fish Oil 1000 MG Caps Take 4 capsules by mouth daily.   furosemide 40 MG tablet Commonly known as: LASIX Take 40 mg by mouth daily.   loratadine 10 MG tablet Commonly known as: CLARITIN Take 10 mg by mouth daily as needed.   losartan 50 MG tablet Commonly known as: COZAAR Take 50 mg by mouth daily.   MAGNESIUM PO Take by mouth.   melatonin 5 MG Tabs Take 10 mg by mouth at bedtime.   metoprolol tartrate 25 MG tablet Commonly known as: LOPRESSOR Take 1 tablet (25 mg total) by mouth 2 (two) times daily. What changed: additional instructions   Multivital tablet Take 1 tablet by mouth daily.   oxybutynin 5 MG tablet Commonly known as: DITROPAN Take 5 mg by mouth daily.   potassium chloride 10 MEQ tablet Commonly known as: KLOR-CON Take 1 tablet (10 mEq total) by mouth daily.   rosuvastatin 20 MG tablet Commonly known as: CRESTOR Take 20 mg by mouth daily.   terbinafine 1 % cream Commonly known as: LAMISIL APPLY MODERATE AMOUNT TO AFFECTED AREA TWICE A DAY   vitamin C 500 MG tablet Commonly known as: ASCORBIC ACID Take 500 mg by mouth daily.  Contact information for after-discharge care     [facility] .   Service: Skilled Nursing Contact information: [address] [address] [phone]                    Allergies  Allergen Reactions    Diovan [Valsartan] Cough   Tagamet [Cimetidine] Other (See Comments)    irrittability       Procedures/Studies: CT Chest W Contrast  Result Date: 02/02/2021 CLINICAL DATA:  Pneumonia, effusion, or abscess suspected EXAM: CT CHEST WITH CONTRAST TECHNIQUE: Multidetector CT imaging of the chest was performed during intravenous contrast administration. CONTRAST:  56mL OMNIPAQUE IOHEXOL 350 MG/ML SOLN COMPARISON:  No prior chest CT, correlation is made with radiographs 02/02/2021 FINDINGS: Cardiovascular: The heart is normal in size. No pericardial effusion. Coronary artery calcification. The aorta is normal in size with mild calcifications, which extend into the branch vessels. Two vessel arch. The main pulmonary artery measures up to 3.0 cm, at the upper limit of normal. Mediastinum/Nodes: Prominent mediastinal lymph nodes, including a right superior paratracheal lymph node that measures 1 cm in short axis (series 2, image 27), left prevascular lymph node that measures up to 1 cm in short axis (series 2, image 62), and a subcarinal lymph node that measures 1 cm in short axis (series 2, image 85). Enlarged right hilar lymph node measures up to 1.4 cm in short axis. Prominent left hilar lymph node measures up to 1 cm in short axis (series 2, image 77). No axillary lymphadenopathy. Prominent subcentimeter paraesophageal lymph nodes Lungs/Pleura: Nodular opacities in the left apex (series 2, image 36). Mild dependent atelectasis. No pleural effusion or pneumothorax. Upper Abdomen: Multiple enlarged lymph nodes, including a 1.1 cm gastrohepatic lymph node (series 2, image 147), a 1 cm precaval lymph node (series 2, image 159), and a 1.7 cm porta hepatis lymph node (series 2, image 158). Nodularity of the left adrenal gland. Musculoskeletal: Multilevel degenerative changes, worst in the lower cervical spine. S shaped curvature. No acute or aggressive osseous finding. IMPRESSION: 1. Nodular opacities in the left  apex, which are nonspecific but favored to be infectious or inflammatory, although a neoplastic process could appear similar. 2. Prominent and enlarged mediastinal, bilateral hilar, retroperitoneal, and mesenteric lymph nodes. While the mediastinal and left hilar lymph nodes could be related to the left apical opacities, the bilaterality and abdominal lymphadenopathy suggest a more diffuse process. Consider CT of the abdomen and pelvis for further evaluation. 3. The main pulmonary artery is at the upper limit of normal in size, which can be seen in thesetting of pulmonary hypertension. 4. Aortic Atherosclerosis (ICD10-I70.0).  Coronary artherosclerosis. These results were called by telephone at the time of interpretation on 02/02/2021 at 6:12 pm to provider Fredia Sorrow , who verbally acknowledged these results. Electronically Signed   By: Merilyn Baba M.D.   On: 02/02/2021 21:15   CT Abdomen Pelvis W Contrast  Result Date: 02/02/2021 CLINICAL DATA:  Abdominal abscess or infection suspected. Chest CT showed adenopathy in the abdomen. Fever and body aches for 2 days. Bilateral leg swelling. EXAM: CT ABDOMEN AND PELVIS WITH CONTRAST TECHNIQUE: Multidetector CT imaging of the abdomen and pelvis was performed using the standard protocol following bolus administration of intravenous contrast. CONTRAST:  57mL OMNIPAQUE IOHEXOL 350 MG/ML SOLN COMPARISON:  CT chest 02/02/2021 FINDINGS: Lower chest: No acute abnormality. Prominent paraesophageal lymph nodes as indicated on previous chest CT.  Hepatobiliary: No focal liver abnormality is seen. Status post cholecystectomy. No biliary dilatation. Pancreas: Unremarkable. No pancreatic ductal dilatation or surrounding inflammatory changes. Spleen: Normal in size without focal abnormality. Adrenals/Urinary Tract: Subcentimeter left adrenal gland nodule. Kidneys are symmetrical. No renal mass lesions or hydronephrosis. No hydroureter. Bladder wall is mildly thickened,  possibly outlet obstruction or cystitis. Stomach/Bowel: Stomach, small bowel, and colon are not abnormally distended. No wall thickening or inflammatory changes are appreciated. Vascular/Lymphatic: Calcification of the aorta. No aneurysm. Prominent lymph nodes in the gastrohepatic ligament, celiac axis, and retroperitoneum. Largest nodes measure about 11 mm short axis dimension. Bilateral pelvic iliac chain and groin lymph nodes are enlarged. Largest nodes in the right groin measure up to about 17 mm short axis dimension. Reproductive: Prostate gland is enlarged. Other: No free air or free fluid in the abdomen. Abdominal wall musculature appears intact. Musculoskeletal: Degenerative changes in the spine. Focal area of sclerosis in the right iliac bone likely represents a benign bone island. IMPRESSION: 1. Mildly enlarged but numerous lymph nodes are present in the para esophageal region, retroperitoneum, celiac axis, pelvis, and groin regions bilaterally. Changes are nonspecific and could represent reactive nodes, lymphoproliferative disorder, or metastatic lesions. 2. Prostate enlargement. Mildly thickened bladder wall could represent bladder outlet obstruction or cystitis. 3. Aortic atherosclerosis. Electronically Signed   By: Lucienne Capers M.D.   On: 02/02/2021 22:21   DG Chest Port 1 View  Result Date: 02/05/2021 CLINICAL DATA:  Acute respiratory failure. EXAM: PORTABLE CHEST 1 VIEW COMPARISON:  February 04, 2021. FINDINGS: Stable cardiomediastinal silhouette. Endotracheal and nasogastric tubes have been removed. No pneumothorax or pleural effusion is noted. Stable central pulmonary vascular congestion is noted with possible bilateral pulmonary edema. Bony thorax is unremarkable. IMPRESSION: Endotracheal and nasogastric tubes have been removed. Mild central pulmonary vascular congestion is noted with possible bibasilar pulmonary edema. Electronically Signed   By: Marijo Conception M.D.   On: 02/05/2021  08:04   DG Chest Port 1 View  Result Date: 02/04/2021 CLINICAL DATA:  Acute respiratory failure EXAM: PORTABLE CHEST 1 VIEW COMPARISON:  February 03, 2021 FINDINGS: No pneumothorax. The ETT is in good position. The NG tube terminates below today's film. Increasing opacity in the left mid lower lung in the interval. Mild increased interstitial markings. The cardiomediastinal silhouette is stable. IMPRESSION: 1. Support apparatus as above. 2. Cardiomegaly and pulmonary venous congestion/mild edema. 3. Increasing opacity in the left mid and lower lung may represent layering effusion with atelectasis, developing infiltrate, or asymmetric edema. Recommend clinical correlation and attention on follow-up. Electronically Signed   By: Dorise Bullion III M.D.   On: 02/04/2021 08:12   DG Chest Portable 1 View  Result Date: 02/03/2021 CLINICAL DATA:  Endotracheal tube placement EXAM: PORTABLE CHEST 1 VIEW COMPARISON:  02/02/2021 FINDINGS: Endotracheal tube tip is 2.5 cm below the level of the clavicular heads and 2.6 cm above the inferior margin of the carina. Esophageal catheter side port is just beyond the gastroesophageal junction. There are unchanged bilateral interstitial opacities. IMPRESSION: Endotracheal tube tip 2.5 cm above the inferior margin of the carina. Electronically Signed   By: Ulyses Jarred M.D.   On: 02/03/2021 00:23   DG Chest Port 1 View  Result Date: 02/02/2021 CLINICAL DATA:  Fever, body aches and shortness of breath. EXAM: PORTABLE CHEST 1 VIEW COMPARISON:  Oct 04, 2017 FINDINGS: Mild, diffuse, chronic appearing increased lung markings are seen. There is no evidence of acute infiltrate, pleural effusion or pneumothorax. The heart size  and mediastinal contours are within normal limits. The visualized skeletal structures are unremarkable. IMPRESSION: No acute cardiopulmonary disease. Electronically Signed   By: Virgina Norfolk M.D.   On: 02/02/2021 19:14   DG Chest Port 1V same  Day  Result Date: 02/02/2021 CLINICAL DATA:  Shortness of breath. EXAM: PORTABLE CHEST 1 VIEW COMPARISON:  February 02, 2021 (6:59 p.m.) FINDINGS: Moderate to marked severity diffusely increased interstitial lung markings are seen. Mildly increased interstitial lung markings are noted on the prior study. There is no evidence of focal consolidation, pleural effusion or pneumothorax. The heart size and mediastinal contours are within normal limits. The visualized skeletal structures are unremarkable. IMPRESSION: Moderate to marked severity interstitial edema. Electronically Signed   By: Virgina Norfolk M.D.   On: 02/02/2021 23:17   ECHOCARDIOGRAM COMPLETE  Result Date: 02/04/2021    ECHOCARDIOGRAM REPORT   Patient Name:   Gary Carroll Date of Exam: 02/04/2021 Medical Rec #:  [id number]          Height:       71.0 in Accession #:    [id number]         Weight:       270.7 lb Date of Birth:  11-08-1945         BSA:          2.398 m Patient Age:    10 years           BP:           124/74 mmHg Patient Gender: M                  HR:           69 bpm. Exam Location:  Inpatient Procedure: 2D Echo, Cardiac Doppler and Color Doppler Indications:    Atrial fibrillation  History:        Patient has prior history of Echocardiogram examinations, most                 recent 10/04/2017. Aortic Valve Disease; Risk                 Factors:Hypertension, Dyslipidemia, Current Smoker and Sleep                 Apnea. GERD.  Sonographer:    Clayton Lefort RDCS (AE) Referring Phys: [id number] [place]  1. Left ventricular ejection fraction, by estimation, is 55 to 60%. The left ventricle has normal function. The left ventricle has no regional wall motion abnormalities. Left ventricular diastolic parameters are indeterminate.  2. Right ventricular systolic function is normal. The right ventricular size is normal.  3. The mitral valve is grossly normal. Trivial mitral valve regurgitation.  4. The aortic valve is  calcified. Aortic valve regurgitation is moderate. Moderate aortic valve stenosis. FINDINGS  Left Ventricle: Left ventricular ejection fraction, by estimation, is 55 to 60%. The left ventricle has normal function. The left ventricle has no regional wall motion abnormalities. The left ventricular internal cavity size was normal in size. There is  no left ventricular hypertrophy. Left ventricular diastolic parameters are indeterminate. Right Ventricle: The right ventricular size is normal. Right vetricular wall thickness was not well visualized. Right ventricular systolic function is normal. Left Atrium: Left atrial size was normal in size. Right Atrium: Right atrial size was normal in size. Pericardium: There is no evidence of pericardial effusion. Mitral Valve: The mitral valve is grossly normal. Mild to moderate mitral annular calcification. Trivial mitral valve  regurgitation. Tricuspid Valve: The tricuspid valve is grossly normal. Tricuspid valve regurgitation is trivial. Aortic Valve: The aortic valve is calcified. Aortic valve regurgitation is moderate. Aortic regurgitation PHT measures 391 msec. Moderate aortic stenosis is present. Aortic valve mean gradient measures 22.5 mmHg. Aortic valve peak gradient measures 40.7 mmHg. Aortic valve area, by VTI measures 1.30 cm. Pulmonic Valve: The pulmonic valve was grossly normal. Pulmonic valve regurgitation is not visualized. Aorta: The aortic root and ascending aorta are structurally normal, with no evidence of dilitation. IAS/Shunts: The atrial septum is grossly normal.  LEFT VENTRICLE PLAX 2D LVOT diam:     2.20 cm  Diastology LV SV:         102      LV e' medial:    5.18 cm/s LV SV Index:   42       LV E/e' medial:  15.3 LVOT Area:     3.80 cm LV e' lateral:   4.95 cm/s                         LV E/e' lateral: 16.0  RIGHT VENTRICLE             IVC RV Basal diam:  3.80 cm     IVC diam: 2.10 cm RV Mid diam:    3.30 cm RV S prime:     16.10 cm/s TAPSE (M-mode): 2.9  cm LEFT ATRIUM             Index       RIGHT ATRIUM           Index LA Vol (A2C):   53.7 ml 22.39 ml/m RA Area:     17.60 cm LA Vol (A4C):   54.7 ml 22.81 ml/m RA Volume:   50.20 ml  20.93 ml/m LA Biplane Vol: 59.2 ml 24.69 ml/m  AORTIC VALVE AV Area (Vmax):    1.15 cm AV Area (Vmean):   1.21 cm AV Area (VTI):     1.30 cm AV Vmax:           319.00 cm/s AV Vmean:          218.500 cm/s AV VTI:            0.782 m AV Peak Grad:      40.7 mmHg AV Mean Grad:      22.5 mmHg LVOT Vmax:         96.50 cm/s LVOT Vmean:        69.400 cm/s LVOT VTI:          0.268 m LVOT/AV VTI ratio: 0.34 AI PHT:            391 msec  AORTA Ao Root diam: 3.20 cm Ao Asc diam:  3.00 cm MITRAL VALVE MV Area (PHT): 2.71 cm    SHUNTS MV Decel Time: 280 msec    Systemic VTI:  0.27 m MV E velocity: 79.40 cm/s  Systemic Diam: 2.20 cm MV A velocity: 65.30 cm/s MV E/A ratio:  1.22 Mertie Moores MD Electronically signed by Mertie Moores MD Signature Date/Time: 02/04/2021/11:39:42 AM    Final      Subjective: Patient is feeling better, no nausea or vomiting, no dyspnea or chest pain, continue to be very weak and deconditioned, chronic lower extremity edema is stable.   Discharge Exam: Vitals:   02/13/21 0800 02/13/21 1134  BP: (!) 124/47 (!) 115/44  Pulse: (!) 56 (!) 53  Resp: 20 19  Temp:  98.2 F (36.8 C)  SpO2: 94% 96%   Vitals:   02/12/21 2347 02/13/21 0327 02/13/21 0800 02/13/21 1134  BP: (!) 127/57 (!) 143/52 (!) 124/47 (!) 115/44  Pulse: (!) 55 (!) 55 (!) 56 (!) 53  Resp: 17 18 20 19   Temp: 98.2 F (36.8 C) (!) 97.2 F (36.2 C)  98.2 F (36.8 C)  TempSrc: Oral Oral    SpO2: 96% 98% 94% 96%  Weight:      Height:        General: Not in pain or dyspnea, deconditioned  Neurology: Awake and alert, non focal  E ENT: no pallor, no icterus, oral mucosa moist Cardiovascular: No JVD. S1-S2 present, rhythmic, no gallops, rubs, or murmurs. ++ non pitting bilateral lower extremity edema. Pulmonary: positive breath sounds  bilaterally, with no wheezing, rhonchi or rales. Gastrointestinal. Abdomen soft and non tender Skin.  Positive lower extremity discoloration.  Musculoskeletal: no joint deformities   The results of significant diagnostics from this hospitalization (including imaging, microbiology, ancillary and laboratory) are listed below for reference.     Microbiology: No results found for this or any previous visit (from the past 240 hour(s)).   Labs: BNP (last 3 results) Recent Labs    02/03/21 0608  BNP 412.8*   Basic Metabolic Panel: Recent Labs  Lab 02/07/21 0244 02/08/21 0215 02/09/21 0248  NA 140 138 140  K 4.4 3.7 4.1  CL 110 108 110  CO2 21* 20* 22  GLUCOSE 122* 123* 124*  BUN 16 19 22   CREATININE 0.89 0.83 0.89  CALCIUM 8.2* 8.4* 8.6*  MG  --  2.2  --    Liver Function Tests: No results for input(s): AST, ALT, ALKPHOS, BILITOT, PROT, ALBUMIN in the last 168 hours. No results for input(s): LIPASE, AMYLASE in the last 168 hours. No results for input(s): AMMONIA in the last 168 hours. CBC: Recent Labs  Lab 02/07/21 0244 02/08/21 0215 02/09/21 0248  WBC 10.7* 10.2 8.5  HGB 12.8* 13.1 12.9*  HCT 39.8 40.6 40.9  MCV 89.6 88.6 90.7  PLT 155 189 219   Cardiac Enzymes: No results for input(s): CKTOTAL, CKMB, CKMBINDEX, TROPONINI in the last 168 hours. BNP: Invalid input(s): POCBNP CBG: No results for input(s): GLUCAP in the last 168 hours. D-Dimer No results for input(s): DDIMER in the last 72 hours. Hgb A1c No results for input(s): HGBA1C in the last 72 hours. Lipid Profile No results for input(s): CHOL, HDL, LDLCALC, TRIG, CHOLHDL, LDLDIRECT in the last 72 hours. Thyroid function studies No results for input(s): TSH, T4TOTAL, T3FREE, THYROIDAB in the last 72 hours.  Invalid input(s): FREET3 Anemia work up No results for input(s): VITAMINB12, FOLATE, FERRITIN, TIBC, IRON, RETICCTPCT in the last 72 hours. Urinalysis    Component Value Date/Time   COLORURINE  YELLOW 02/02/2021 1843   APPEARANCEUR HAZY (A) 02/02/2021 1843   LABSPEC 1.017 02/02/2021 1843   PHURINE 7.0 02/02/2021 1843   GLUCOSEU NEGATIVE 02/02/2021 1843   HGBUR NEGATIVE 02/02/2021 1843   BILIRUBINUR NEGATIVE 02/02/2021 1843   BILIRUBINUR neg 08/31/2012 1203   KETONESUR NEGATIVE 02/02/2021 1843   PROTEINUR NEGATIVE 02/02/2021 1843   UROBILINOGEN negative 08/31/2012 1203   UROBILINOGEN 0.2 05/31/2010 1125   NITRITE POSITIVE (A) 02/02/2021 1843   LEUKOCYTESUR MODERATE (A) 02/02/2021 1843   Sepsis Labs Invalid input(s): PROCALCITONIN,  WBC,  LACTICIDVEN Microbiology No results found for this or any previous visit (from the past 240 hour(s)).   Time coordinating discharge: 45 minutes  SIGNED:   Tawni Millers, MD  [facility] 02/13/2021, 11:43 AM

## 2021-02-14 DIAGNOSIS — R6521 Severe sepsis with septic shock: Secondary | ICD-10-CM | POA: Diagnosis not present

## 2021-02-14 DIAGNOSIS — A419 Sepsis, unspecified organism: Secondary | ICD-10-CM | POA: Diagnosis not present

## 2021-02-14 MED ORDER — MELATONIN 5 MG PO TABS
10.0000 mg | ORAL_TABLET | Freq: Once | ORAL | Status: AC
  Administered 2021-02-14: 10 mg via ORAL
  Filled 2021-02-14: qty 2

## 2021-02-14 MED ORDER — DIPHENHYDRAMINE HCL 25 MG PO CAPS
50.0000 mg | ORAL_CAPSULE | Freq: Once | ORAL | Status: AC
  Administered 2021-02-14: 50 mg via ORAL
  Filled 2021-02-14: qty 2

## 2021-02-14 NOTE — TOC Progression Note (Signed)
Transition of Care [facility]) - Progression Note    Patient Details  Name: Gary Carroll MRN: [id number] Date of Birth: 1945-07-10  Transition of Care [facility]) CM/SW [place], RN Phone [phone]  02/14/2021, 11:47 AM  Clinical Narrative:    CM received call from [facility] at [facility] in [place] to make Cm aware that patients insurance has denied SNF. MD made aware of peer to peer due by 4:30 pm today.    Expected Discharge Plan: IP Rehab Facility Barriers to Discharge: Continued Medical Work up  Expected Discharge Plan and Services Expected Discharge Plan: [place] In-house Referral: NA Discharge Planning Services: CM Consult Post Acute Care Choice: NA Living arrangements for the past 2 months: Apartment                 DME Arranged: N/A DME Agency: NA       HH Arranged: NA HH Agency: NA         Social Determinants of Health (SDOH) Interventions    Readmission Risk Interventions Readmission Risk Prevention Plan 02/07/2021  Transportation Screening Complete  PCP or Specialist Appt within 5-7 Days Complete  Home Care Screening Complete  Medication Review (RN CM) Referral to Pharmacy  Some recent data might be hidden

## 2021-02-14 NOTE — Plan of Care (Signed)
  Problem: Education: Goal: Knowledge of General Education information will improve Description: Including pain rating scale, medication(s)/side effects and non-pharmacologic comfort measures Outcome: Progressing   Problem: Health Behavior/Discharge Planning: Goal: Ability to manage health-related needs will improve Outcome: Progressing   Problem: Clinical Measurements: Goal: Ability to maintain clinical measurements within normal limits will improve Outcome: Progressing Goal: Will remain free from infection Outcome: Progressing Goal: Diagnostic test results will improve Outcome: Progressing Goal: Respiratory complications will improve Outcome: Progressing   Problem: Coping: Goal: Level of anxiety will decrease Outcome: Progressing   Problem: Elimination: Goal: Will not experience complications related to bowel motility Outcome: Progressing

## 2021-02-14 NOTE — Progress Notes (Signed)
Physical Therapy Treatment Patient Details Name: Gary Carroll MRN: [id number] DOB: [birth year] Today's Date: 02/14/2021   History of Present Illness 75 y.o. male admitted to [facility] 9/16 with sepsis, weakness, fatigue, body aches, and LE swelling. Pt with flash pulmonary edema in ED and Intubated. Transfer to [facility] 9/17. Extubated 9/18.  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 Pt was indep, driving, working out at [employer], and didn't require use of AD for safe amb prior to admit. Pt now deconditioned, demo's impaired balance and decreased activity tolerance. Pt requires RW for safe ambulation. Pt to benefit from ST-SNF [place] d/c to achieve safe mod I level of function for safe transition home.    Recommendations for follow up therapy are one component of a multi-disciplinary discharge planning process, led by the attending physician.  Recommendations may be updated based on patient status, additional functional criteria and insurance authorization.  Follow Up Recommendations  SNF;Supervision for mobility/OOB     Equipment Recommendations  None recommended by PT    Recommendations for Other Services       Precautions / Restrictions Precautions Precautions: Fall Restrictions Weight Bearing Restrictions: No     Mobility  Bed Mobility Overal bed mobility: Needs Assistance Bed Mobility: Supine to Sit     Supine to sit: Supervision     General bed mobility comments: increased time, HOB slightly elevated, pt with definite use of bed rail, pt states "my sits up are getting more difficult" discussed using a log roll technique, pt reports doing so recently    Transfers Overall transfer level: Needs assistance Equipment used: Rolling walker (2 wheeled) Transfers: Sit to/from Stand Sit to Stand: Min guard         General transfer comment: increased time and effort to power up, min guard to steady, 2  attempts to achieve full standing  Ambulation/Gait Ambulation/Gait assistance: Min guard Gait Distance (Feet): 180 Feet Assistive device: Rolling walker (2 wheeled) Gait Pattern/deviations: Step-through pattern;Trunk flexed;Decreased stride length Gait velocity: dec Gait velocity interpretation: <1.31 ft/sec, indicative of household ambulator General Gait Details: pt with good walker management, pt reports mild SOB, a little more out of breath than normal, SPO2 at 97% on RA, pt reports "i'm just out of shape from being here"   Stairs             Wheelchair Mobility    Modified Rankin (Stroke Patients Only)       Balance Overall balance assessment: Needs assistance Sitting-balance support: No upper extremity supported;Feet supported Sitting balance-Leahy Scale: Fair Sitting balance - Comments: has difficulty reaching toes, required assist for donning of socks   Standing balance support: No upper extremity supported;During functional activity Standing balance-Leahy Scale: Fair Standing balance comment: needs RW for safe amb at this time                            Cognition Arousal/Alertness: Awake/alert Behavior During Therapy: WFL for tasks assessed/performed Overall Cognitive Status: Within Functional Limits for tasks assessed                                 General Comments: demo's good insight to deficits and safety      Exercises General Exercises - Lower Extremity Long Arc Quad: AROM;Left;10 reps;Seated (with mild resistance) Heel  Slides: AROM;Both;10 reps;Seated (with mild resistance) Hip Flexion/Marching: AAROM;Strengthening;Both;10 reps;Seated (with mild resistance) Heel Raises: AROM;Both;10 reps;Seated (with mild resistance)    General Comments General comments (skin integrity, edema, etc.): HR in 70s, SPO2 >95% on RA      Pertinent Vitals/Pain Pain Assessment:  (reports generalized discomfort all over from lying in the bed)     Home Living                      Prior Function            PT Goals (current goals can now be found in the care plan section) Progress towards PT goals: Progressing toward goals    Frequency    Min 3X/week      PT Plan Current plan remains appropriate    Co-evaluation              AM-PAC PT "6 Clicks" Mobility   Outcome Measure  Help needed turning from your back to your side while in a flat bed without using bedrails?: A Little Help needed moving from lying on your back to sitting on the side of a flat bed without using bedrails?: A Little Help needed moving to and from a bed to a chair (including a wheelchair)?: A Little Help needed standing up from a chair using your arms (e.g., wheelchair or bedside chair)?: A Little Help needed to walk in hospital room?: A Little Help needed climbing 3-5 steps with a railing? : A Little 6 Click Score: 18    End of Session Equipment Utilized During Treatment: Gait belt Activity Tolerance: Patient tolerated treatment well Patient left: in chair;with call bell/phone within reach;with chair alarm set Nurse Communication: Mobility status PT Visit Diagnosis: Other abnormalities of gait and mobility (R26.89);Unsteadiness on feet (R26.81);Muscle weakness (generalized) (M62.81)     Time: 4327-6147 PT Time Calculation (min) (ACUTE ONLY): 31 min  Charges:  $Gait Training: 8-22 mins $Therapeutic Exercise: 8-22 mins                     Kittie Plater, PT, DPT Acute Rehabilitation Services Pager #: [phone] Office #: [phone]    Berline Lopes 02/14/2021, 8:48 AM

## 2021-02-14 NOTE — Progress Notes (Signed)
PROGRESS NOTE    Gary Carroll  GQQ:[id number] DOB: Sep 18, 1945 DOA: 02/02/2021 PCP: Gary Squibb, MD    Brief Narrative:  [name] was admitted to the hospital with the working diagnosis of septic shock due to urine infection, complicated with respiratory failure due to acute pulmonary edema and atrial fibrillation with RVR.    75 year old male past medical history of hypertension, GERD, dyslipidemia, atrial fibrillation, and obesity class II who presented with fatigue, body aches, lower extremity edema and erythema.  Initially diagnosed with lower extremity cellulitis, received doxycycline and ceftriaxone and discharged home.  He was brought back to the emergency department by EMS due to altered mentation.  Apparently he was not in the pharmacy getting his prescriptions when EMS was called.  On his initial physical examination he was febrile 102.1 F, blood pressure 80/60, heart rate 81, respiratory rate 28, oxygen saturation 97%.  His lungs were clear to auscultation, heart S1-S2, present, tachycardic, abdomen was soft, nontender, his lower extremities were cool, positive edema and bilateral erythema.   Sodium 136, potassium 4.2, chloride 105, bicarb 23, glucose 135, BUN 22, creatinine 1.0, lactic acid 3.0-2.6, white count 16.7, hemoglobin 13.8, hematocrit 42.3, platelets 162. SARS COVID-19 negative.   Urinalysis, specific gravity 1.017, 6-10 red cells,> 50 white cells.   His chest radiograph had no infiltrates.   EKG 90 bpm, normal axis, normal intervals, sinus rhythm, V4-V6 ST depressions, no significant T wave changes.   Patient received aggressive volume resuscitation and broad-spectrum antibiotic therapy. Required vasopressors due to persistent shock. He developed pulmonary edema and required intubation with mechanical ventilation.   9/17 transfer from [facility] to [facility] for further management.   9/18 vasopressors were discontinued and patient was liberated from mechanical  ventilation.   Transferred to [facility] 9/20.   02/08/21. Patient developed atrial fibrillation with rapid ventricular response, requiring diltiazem infusion.  Converted back to sinus rhythm.   Completed antibiotic therapy.  Pending transfer to SNF, insurance authorization.   Assessment & Plan:   Principal Problem:   Septic shock (Gary Carroll) Active Problems:   Essential hypertension, benign   Erectile dysfunction   AF (paroxysmal atrial fibrillation) (HCC)   Sepsis due to cellulitis (HCC)   BPH (benign prostatic hyperplasia)   Sleep apnea   Acute respiratory failure with hypoxia and hypercapnia (HCC)   Acute pulmonary edema (HCC)   Pressure injury of skin   Septic shock due to urine infection, in the setting of chronic self catheterization/ urinary retention.   CT abdomen and pelvis from 09.16.22 with no hydronephrosis, prostate enlargement.    Sepsis due to urine infection has resolved. Completed antibiotic therapy. Patient very weak and deconditioned, pending transfer to SNF for further therapy, pending insurance authorization.    2. Acute hypoxemic respiratory failure due to acute diastolic heart failure, volume overload. HTN Echocardiogram with LV systolic function 55 to 67%.    Continue with losartan for blood pressure control and furosemide for diuresis. No acute exacerbation today.     3. Paroxysmal atrial fibrillation.  Positive troponin elevation, likely demand ischemia from tachyarrhythmia.    Tolerating well metoprolol 25 mg po bid for rate control, continue anticoagulation with apixaban.  Telemetry has been discontinued.    4. Hypokalemia, non anion gap metabolic acidosis.  Continue diuresis and KCL supplementation. He has chronic lower extremity edema due to venous insufficiency.  We will repeat labs today..   5. Dyslipidemia. Continue with rosuvastatin.      6. Peripheral vascular disease. Positive chronic lower  extremity discoloration and non pitting edema. To  resume compression stockings as outpatient.    7. Obesity class 2. Stage 2 sacrum pressure ulcer present on admission.  Calculated BMI is 37.7.  Continue PT and OT for mobility.    8. Pulmonary nodule.  Positive left apical nodular infiltrate, positive enlarged mediastinal, bilateral hilar, retroperitoneal, and mesenteric lymph nodes. Patient will follow up with Dr Gary Carroll from pulmonary as outpatient.    Status is: Inpatient  Remains inpatient appropriate because:Inpatient level of care appropriate due to severity of illness  Dispo: The patient is from: Home              Anticipated d/c is to: SNF              Patient currently is medically stable to d/c.  Per TOC, insurance authorization declined for SNF.  Will do peer-to-peer today.   Difficult to place patient No  DVT prophylaxis: Apixaban   Code Status:    full  Family Communication:  No family at the bedside    Nutrition Status:   Skin Documentation: Pressure Injury 02/03/21 Sacrum Lower;Medial Stage 2 -  Partial thickness loss of dermis presenting as a shallow open injury with a red, pink wound bed without slough. (Active)  02/03/21 0330  Location: Sacrum  Location Orientation: Lower;Medial  Staging: Stage 2 -  Partial thickness loss of dermis presenting as a shallow open injury with a red, pink wound bed without slough.  Wound Description (Comments):   Present on Admission: Yes      Subjective: Patient seen and examined.  He has no complaints.  Objective: Vitals:   02/13/21 0327 02/13/21 0800 02/13/21 1134 02/13/21 1931  BP: (!) 143/52 (!) 124/47 (!) 115/44 (!) 130/47  Pulse: (!) 55 (!) 56 (!) 53 60  Resp: 18 20 19 19   Temp: (!) 97.2 F (36.2 C)  98.2 F (36.8 C) (!) 97.4 F (36.3 C)  TempSrc: Oral     SpO2: 98% 94% 96% 94%  Weight:      Height:        Intake/Output Summary (Last 24 hours) at 02/14/2021 1213 Last data filed at 02/14/2021 0841 Gross per 24 hour  Intake 120 ml  Output 1450 ml  Net -1330  ml    Filed Weights   02/02/21 1658 02/03/21 0320  Weight: 121.6 kg 122.8 kg    Examination:   General exam: Appears calm and comfortable  Respiratory system: Clear to auscultation. Respiratory effort normal. Cardiovascular system: S1 & S2 heard, RRR. No JVD, murmurs, rubs, gallops or clicks. No pedal edema. Gastrointestinal system: Abdomen is nondistended, soft and nontender. No organomegaly or masses felt. Normal bowel sounds heard. Central nervous system: Alert and oriented. No focal neurological deficits. Extremities: Symmetric 5 x 5 power. Skin: No rashes, lesions or ulcers.  Psychiatry: Judgement and insight appear normal. Mood & affect appropriate.     Data Reviewed: I have personally reviewed following labs and imaging studies  CBC: Recent Labs  Lab 02/08/21 0215 02/09/21 0248  WBC 10.2 8.5  HGB 13.1 12.9*  HCT 40.6 40.9  MCV 88.6 90.7  PLT 189 443    Basic Metabolic Panel: Recent Labs  Lab 02/08/21 0215 02/09/21 0248  NA 138 140  K 3.7 4.1  CL 108 110  CO2 20* 22  GLUCOSE 123* 124*  BUN 19 22  CREATININE 0.83 0.89  CALCIUM 8.4* 8.6*  MG 2.2  --     GFR: Estimated Creatinine Clearance: 95.7  mL/min (by C-G formula based on SCr of 0.89 mg/dL). Liver Function Tests: No results for input(s): AST, ALT, ALKPHOS, BILITOT, PROT, ALBUMIN in the last 168 hours. No results for input(s): LIPASE, AMYLASE in the last 168 hours. No results for input(s): AMMONIA in the last 168 hours. Coagulation Profile: No results for input(s): INR, PROTIME in the last 168 hours. Cardiac Enzymes: No results for input(s): CKTOTAL, CKMB, CKMBINDEX, TROPONINI in the last 168 hours. BNP (last 3 results) No results for input(s): PROBNP in the last 8760 hours. HbA1C: No results for input(s): HGBA1C in the last 72 hours. CBG: No results for input(s): GLUCAP in the last 168 hours.  Lipid Profile: No results for input(s): CHOL, HDL, LDLCALC, TRIG, CHOLHDL, LDLDIRECT in the last  72 hours. Thyroid Function Tests: No results for input(s): TSH, T4TOTAL, FREET4, T3FREE, THYROIDAB in the last 72 hours. Anemia Panel: No results for input(s): VITAMINB12, FOLATE, FERRITIN, TIBC, IRON, RETICCTPCT in the last 72 hours.   Radiology Studies: I have reviewed all of the imaging during this hospital visit personally  Scheduled Meds:  apixaban  5 mg Oral BID   Chlorhexidine Gluconate Cloth  6 each Topical Q0600   docusate sodium  100 mg Oral BID   furosemide  40 mg Oral Daily   losartan  50 mg Oral Daily   metoprolol tartrate  25 mg Oral BID   polyethylene glycol  17 g Oral Daily   potassium chloride  10 mEq Oral Daily   rosuvastatin  20 mg Oral Daily   Continuous Infusions:  sodium chloride Stopped (02/05/21 1846)     LOS: 12 days   Total time spent in minutes: 30 min Darliss Cheney, MD TRH/hospitalist

## 2021-02-15 DIAGNOSIS — A419 Sepsis, unspecified organism: Secondary | ICD-10-CM

## 2021-02-15 DIAGNOSIS — R6521 Severe sepsis with septic shock: Secondary | ICD-10-CM

## 2021-02-15 LAB — BASIC METABOLIC PANEL
Anion gap: 8 (ref 5–15)
BUN: 22 mg/dL (ref 8–23)
CO2: 22 mmol/L (ref 22–32)
Calcium: 9.1 mg/dL (ref 8.9–10.3)
Chloride: 107 mmol/L (ref 98–111)
Creatinine, Ser: 0.92 mg/dL (ref 0.61–1.24)
GFR, Estimated: >60 mL/min (ref >60)
Glucose, Bld: 175 mg/dL — ABNORMAL HIGH (ref 70–99)
Potassium: 4.1 mmol/L (ref 3.5–5.1)
Sodium: 137 mmol/L (ref 135–145)

## 2021-02-15 MED ORDER — DIPHENHYDRAMINE HCL 25 MG PO CAPS
50.0000 mg | ORAL_CAPSULE | Freq: Every evening | ORAL | Status: DC | PRN
  Administered 2021-02-15: 50 mg via ORAL
  Filled 2021-02-15: qty 2

## 2021-02-15 MED ORDER — MELATONIN 5 MG PO TABS
10.0000 mg | ORAL_TABLET | Freq: Every evening | ORAL | Status: DC | PRN
  Administered 2021-02-15: 10 mg via ORAL
  Filled 2021-02-15: qty 2

## 2021-02-15 NOTE — Progress Notes (Addendum)
Occupational Therapy Treatment Patient Details Name: Gary Carroll MRN: [id number] DOB: 1945-12-27 Today's Date: 02/15/2021   History of present illness 75 y.o. male admitted to [facility] 9/16 with sepsis, weakness, fatigue, body aches, and LE swelling. Pt with flash pulmonary edema in ED and Intubated. Transfer to [facility] 9/17. Extubated 9/18.  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 Patient was supine and was able to get to eob and stand with increased time.  Patient demonstrated safe transfer to toilet in bathroom and was MI with clothing management and hygiene.  Patient was distant supervision to perform grooming standing at sink.  Patient left in recliner. Patient has made good gains with OT treatment.    Recommendations for follow up therapy are one component of a multi-disciplinary discharge planning process, led by the attending physician.  Recommendations may be updated based on patient status, additional functional criteria and insurance authorization.    Follow Up Recommendations  Home health OT    Equipment Recommendations  Other (comment)    Recommendations for Other Services      Precautions / Restrictions Precautions Precautions: Fall Precaution Comments: watch HR       Mobility Bed Mobility Overal bed mobility: Needs Assistance Bed Mobility: Supine to Sit     Supine to sit: Modified independent (Device/Increase time)     General bed mobility comments: used rails and increased time    Transfers Overall transfer level: Needs assistance Equipment used: Rolling walker (2 wheeled) Transfers: Sit to/from Stand Sit to Stand: Supervision         General transfer comment: used rails to transfer to toilet    Balance Overall balance assessment: Needs assistance Sitting-balance support: No upper extremity  supported;Feet supported Sitting balance-Leahy Scale: Fair     Standing balance support: No upper extremity supported;During functional activity Standing balance-Leahy Scale: Fair Standing balance comment: distant supervision while standing at sink for grooming                           ADL either performed or assessed with clinical judgement   ADL Overall ADL's : Needs assistance/impaired     Grooming: Wash/dry hands;Wash/dry face;Standing Grooming Details (indicate cue type and reason): distant supervision standing at sink                 Toilet Transfer: Supervision/safety;Ambulation Toilet Transfer Details (indicate cue type and reason): demonstrated good safety with RW and rail use Toileting- Clothing Manipulation and Hygiene: Modified independent Toileting - Clothing Manipulation Details (indicate cue type and reason): peformed without assistance     Functional mobility during ADLs: Supervision/safety;Rolling walker General ADL Comments: Patient was supervision for mobility and transfers     Vision       Perception     Praxis      Cognition Arousal/Alertness: Awake/alert Behavior During Therapy: WFL for tasks assessed/performed Overall Cognitive Status: Within Functional Limits for tasks assessed                                 General Comments: demonstrated good safety with mobility and transfers        Exercises     Shoulder Instructions       General Comments  Pertinent Vitals/ Pain       Pain Assessment: No/denies pain  Home Living                                          Prior Functioning/Environment              Frequency  Min 2X/week        Progress Toward Goals  OT Goals(current goals can now be found in the care plan section)  Progress towards OT goals: Progressing toward goals  Acute Rehab OT Goals Patient Stated Goal: [facility] Goal Formulation:  With patient Time For Goal Achievement: 02/21/21 Potential to Achieve Goals: Good ADL Goals Pt Will Perform Grooming: with modified independence;standing Pt Will Perform Upper Body Dressing: with modified independence;sitting Pt Will Perform Lower Body Dressing: with modified independence;sit to/from stand Pt Will Transfer to Toilet: with modified independence;ambulating Pt Will Perform Toileting - Clothing Manipulation and hygiene: with modified independence;sitting/lateral leans;sit to/from stand Pt Will Perform Tub/Shower Transfer: Tub transfer;with min guard assist;ambulating;shower seat;rolling walker  Plan Discharge plan remains appropriate    Co-evaluation                 AM-PAC OT "6 Clicks" Daily Activity     Outcome Measure   Help from another person eating meals?: None Help from another person taking care of personal grooming?: A Little Help from another person toileting, which includes using toliet, bedpan, or urinal?: A Little Help from another person bathing (including washing, rinsing, drying)?: A Little Help from another person to put on and taking off regular upper body clothing?: A Little Help from another person to put on and taking off regular lower body clothing?: A Little 6 Click Score: 19    End of Session Equipment Utilized During Treatment: Rolling walker  OT Visit Diagnosis: Unsteadiness on feet (R26.81);Muscle weakness (generalized) (M62.81)   Activity Tolerance Patient tolerated treatment well;Patient limited by fatigue   Patient Left in chair;with call bell/phone within reach;with chair alarm set   Nurse Communication Mobility status        Time: 1610-9604 OT Time Calculation (min): 30 min  Charges: OT General Charges $OT Visit: 1 Visit OT Treatments $Self Care/Home Management : 23-37 mins  Lodema Hong, [place] 02/15/2021, 1:44 PM

## 2021-02-15 NOTE — TOC Progression Note (Signed)
Transition of Care [facility]) - Progression Note    Patient Details  Name: Gary Carroll MRN: [id number] Date of Birth: 1945/10/11  Transition of Care [facility]) CM/SW [place], RN Phone Number:[phone]  02/15/2021, 9:42 AM  Clinical Narrative:    TOC continues to follow.Peer to peer done on 9/28. Per MD we are awaiting a return call. CM checked with Ebony Hail at [facility] and she currently has no information but states that she will check in the system when she gets to her office a little later today.   Expected Discharge Plan: IP Rehab Facility Barriers to Discharge: Continued Medical Work up  Expected Discharge Plan and Services Expected Discharge Plan: [place] In-house Referral: NA Discharge Planning Services: CM Consult Post Acute Care Choice: NA Living arrangements for the past 2 months: Apartment                 DME Arranged: N/A DME Agency: NA       HH Arranged: NA HH Agency: NA         Social Determinants of Health (SDOH) Interventions    Readmission Risk Interventions Readmission Risk Prevention Plan 02/07/2021  Transportation Screening Complete  PCP or Specialist Appt within 5-7 Days Complete  Home Care Screening Complete  Medication Review (RN CM) Referral to Pharmacy  Some recent data might be hidden

## 2021-02-15 NOTE — TOC Progression Note (Addendum)
Transition of Care [facility]) - Progression Note    Patient Details  Name: Gary Carroll MRN: [id number] Date of Birth: 02/08/1946  Transition of Care [facility]) CM/SW [place], RN Phone Number:[phone]  02/15/2021, 12:34 PM  Clinical Narrative:    CM made aware per MD that insurance auth case has been closed and patient would need to file expedite appeal for possible approval. CM at bedside to offer patient info for appeal. Patient refuses  stating that he just wants to go home with [facility]. MD has been updated.   Expected Discharge Plan: IP Rehab Facility Barriers to Discharge: Continued Medical Work up  Expected Discharge Plan and Services Expected Discharge Plan: [place] In-house Referral: NA Discharge Planning Services: CM Consult Post Acute Care Choice: NA Living arrangements for the past 2 months: Apartment                 DME Arranged: N/A DME Agency: NA       HH Arranged: NA HH Agency: NA         Social Determinants of Health (SDOH) Interventions    Readmission Risk Interventions Readmission Risk Prevention Plan 02/07/2021  Transportation Screening Complete  PCP or Specialist Appt within 5-7 Days Complete  Home Care Screening Complete  Medication Review (RN CM) Referral to Pharmacy  Some recent data might be hidden

## 2021-02-15 NOTE — Progress Notes (Signed)
PROGRESS NOTE    Gary Carroll  KVQ:[id number] DOB: Dec 20, 1945 DOA: 02/02/2021 PCP: Celene Squibb, MD    Brief Narrative:  Gary Carroll was admitted to the hospital with the working diagnosis of septic shock due to urine infection, complicated with respiratory failure due to acute pulmonary edema and atrial fibrillation with RVR.    75 year old male past medical history of hypertension, GERD, dyslipidemia, atrial fibrillation, and obesity class II who presented with fatigue, body aches, lower extremity edema and erythema.  Initially diagnosed with lower extremity cellulitis, received doxycycline and ceftriaxone and discharged home.  He was brought back to the emergency department by EMS due to altered mentation.  Apparently he was not in the pharmacy getting his prescriptions when EMS was called.  On his initial physical examination he was febrile 102.1 F, blood pressure 80/60, heart rate 81, respiratory rate 28, oxygen saturation 97%.  His lungs were clear to auscultation, heart S1-S2, present, tachycardic, abdomen was soft, nontender, his lower extremities were cool, positive edema and bilateral erythema.   Sodium 136, potassium 4.2, chloride 105, bicarb 23, glucose 135, BUN 22, creatinine 1.0, lactic acid 3.0-2.6, white count 16.7, hemoglobin 13.8, hematocrit 42.3, platelets 162. SARS COVID-19 negative.   Urinalysis, specific gravity 1.017, 6-10 red cells,> 50 white cells.   His chest radiograph had no infiltrates.   EKG 90 bpm, normal axis, normal intervals, sinus rhythm, V4-V6 ST depressions, no significant T wave changes.   Patient received aggressive volume resuscitation and broad-spectrum antibiotic therapy. Required vasopressors due to persistent shock. He developed pulmonary edema and required intubation with mechanical ventilation.   9/17 transfer from [facility] to [facility] for further management.   9/18 vasopressors were discontinued and patient was liberated from mechanical  ventilation.   Transferred to [facility] 9/20.   02/08/21. Patient developed atrial fibrillation with rapid ventricular response, requiring diltiazem infusion.  Converted back to sinus rhythm.   Completed antibiotic therapy.  Pending transfer to SNF, insurance authorization.   Assessment & Plan:   Principal Problem:   Septic shock ([place]) Active Problems:   Essential hypertension, benign   Erectile dysfunction   AF (paroxysmal atrial fibrillation) (HCC)   Sepsis due to cellulitis (HCC)   BPH (benign prostatic hyperplasia)   Sleep apnea   Acute respiratory failure with hypoxia and hypercapnia (HCC)   Acute pulmonary edema (HCC)   Pressure injury of skin   Septic shock due to urine infection, in the setting of chronic self catheterization/ urinary retention.   CT abdomen and pelvis from 09.16.22 with no hydronephrosis, prostate enlargement.    Sepsis due to urine infection has resolved. Completed antibiotic therapy. Patient very weak and deconditioned, pending transfer to SNF for further therapy.   2. Acute hypoxemic respiratory failure due to acute diastolic heart failure, volume overload. HTN Echocardiogram with LV systolic function 55 to 64%.    Continue with losartan for blood pressure control and furosemide for diuresis. No acute exacerbation today.     3. Paroxysmal atrial fibrillation.  Positive troponin elevation, likely demand ischemia from tachyarrhythmia.    Tolerating well metoprolol 25 mg po bid for rate control, continue anticoagulation with apixaban.  Telemetry has been discontinued.    4. Hypokalemia, non anion gap metabolic acidosis.  Resolved.   5. Dyslipidemia. Continue with rosuvastatin.      6. Peripheral vascular disease. Positive chronic lower extremity discoloration and non pitting edema. To resume compression stockings as outpatient.    7. Obesity class 2. Stage 2 sacrum pressure ulcer  present on admission.  Calculated BMI is 37.7.  Continue PT and  OT for mobility.    8. Pulmonary nodule.  Positive left apical nodular infiltrate, positive enlarged mediastinal, bilateral hilar, retroperitoneal, and mesenteric lymph nodes. Patient will follow up with Dr Lamonte Sakai from pulmonary as outpatient.   Disposition: Per TOC request, I called insurance company yesterday to do a peer to peer.  The front desk person had mentioned that physician will call me for peer to peer.  I did not receive any calls.  I called them again this morning.  I had to leave voicemail.  The representative called me stating that the physician had tried calling yesterday and today but NEVER left any voicemail for me to call back.  Per representative, the case has been closed.  Informed TOC to do expedited appeal.  Patient does not want to pursue that however I did inform him that he will be an unsafe discharge since he needs help and he lives alone.  We decided to give him a day to think about that.  We we will revisit tomorrow.   Status is: Inpatient  Remains inpatient appropriate because:Inpatient level of care appropriate due to severity of illness  Dispo: The patient is from: Home              Anticipated d/c is to: SNF              Patient currently is medically stable to d/c.     Difficult to place patient No  DVT prophylaxis: Apixaban   Code Status:    full  Family Communication:  No family at the bedside    Nutrition Status:   Skin Documentation: Pressure Injury 02/03/21 Sacrum Lower;Medial Stage 2 -  Partial thickness loss of dermis presenting as a shallow open injury with a red, pink wound bed without slough. (Active)  02/03/21 0330  Location: Sacrum  Location Orientation: Lower;Medial  Staging: Stage 2 -  Partial thickness loss of dermis presenting as a shallow open injury with a red, pink wound bed without slough.  Wound Description (Comments):   Present on Admission: Yes      Subjective: Patient seen and examined.  He has no  complaints.  Objective: Vitals:   02/14/21 1221 02/14/21 2001 02/15/21 0600 02/15/21 0803  BP: (!) 122/52 (!) 116/48 (!) 138/57 (!) 125/48  Pulse: (!) 57 66 (!) 52 (!) 58  Resp: 19 19 16    Temp: (!) 97.4 F (36.3 C) 97.7 F (36.5 C) 98.2 F (36.8 C)   TempSrc:   Oral   SpO2: 95% 95% 97%   Weight:      Height:        Intake/Output Summary (Last 24 hours) at 02/15/2021 1247 Last data filed at 02/15/2021 0800 Gross per 24 hour  Intake 240 ml  Output 700 ml  Net -460 ml    Filed Weights   02/02/21 1658 02/03/21 0320  Weight: 121.6 kg 122.8 kg    Examination:   General exam: Appears calm and comfortable  Respiratory system: Clear to auscultation. Respiratory effort normal. Cardiovascular system: S1 & S2 heard, RRR. No JVD, murmurs, rubs, gallops or clicks. No pedal edema. Gastrointestinal system: Abdomen is nondistended, soft and nontender. No organomegaly or masses felt. Normal bowel sounds heard. Central nervous system: Alert and oriented. No focal neurological deficits. Extremities: Symmetric 5 x 5 power. Skin: No rashes, lesions or ulcers.  Psychiatry: Judgement and insight appear normal. Mood & affect appropriate.  Data Reviewed: I have personally reviewed following labs and imaging studies  CBC: Recent Labs  Lab 02/09/21 0248  WBC 8.5  HGB 12.9*  HCT 40.9  MCV 90.7  PLT 831    Basic Metabolic Panel: Recent Labs  Lab 02/09/21 0248 02/15/21 0831  NA 140 137  K 4.1 4.1  CL 110 107  CO2 22 22  GLUCOSE 124* 175*  BUN 22 22  CREATININE 0.89 0.92  CALCIUM 8.6* 9.1    GFR: Estimated Creatinine Clearance: 92.5 mL/min (by C-G formula based on SCr of 0.92 mg/dL). Liver Function Tests: No results for input(s): AST, ALT, ALKPHOS, BILITOT, PROT, ALBUMIN in the last 168 hours. No results for input(s): LIPASE, AMYLASE in the last 168 hours. No results for input(s): AMMONIA in the last 168 hours. Coagulation Profile: No results for input(s): INR, PROTIME  in the last 168 hours. Cardiac Enzymes: No results for input(s): CKTOTAL, CKMB, CKMBINDEX, TROPONINI in the last 168 hours. BNP (last 3 results) No results for input(s): PROBNP in the last 8760 hours. HbA1C: No results for input(s): HGBA1C in the last 72 hours. CBG: No results for input(s): GLUCAP in the last 168 hours.  Lipid Profile: No results for input(s): CHOL, HDL, LDLCALC, TRIG, CHOLHDL, LDLDIRECT in the last 72 hours. Thyroid Function Tests: No results for input(s): TSH, T4TOTAL, FREET4, T3FREE, THYROIDAB in the last 72 hours. Anemia Panel: No results for input(s): VITAMINB12, FOLATE, FERRITIN, TIBC, IRON, RETICCTPCT in the last 72 hours.   Radiology Studies: I have reviewed all of the imaging during this hospital visit personally  Scheduled Meds:  apixaban  5 mg Oral BID   Chlorhexidine Gluconate Cloth  6 each Topical Q0600   docusate sodium  100 mg Oral BID   furosemide  40 mg Oral Daily   losartan  50 mg Oral Daily   metoprolol tartrate  25 mg Oral BID   polyethylene glycol  17 g Oral Daily   potassium chloride  10 mEq Oral Daily   rosuvastatin  20 mg Oral Daily   Continuous Infusions:  sodium chloride Stopped (02/05/21 1846)     LOS: 13 days   Total time spent in minutes: 54 min Darliss Cheney, MD TRH/hospitalist

## 2021-02-16 NOTE — Discharge Summary (Signed)
Physician Discharge Summary  Gary Carroll YSA:[id number] DOB: May 24, 1945 DOA: 02/02/2021  PCP: Celene Squibb, MD  Admit date: 02/02/2021 Discharge date: 02/16/2021  Admitted From: Home  Disposition:   SNF   Recommendations for Outpatient Follow-up and new medication changes:  Follow up with Dr Nevada Crane in 7 to 10 days. Follow up renal function in 7 days. Increased metoprolol from 25 mg bid to 50 mg bid   Home Health: na   Equipment/Devices: na    Discharge Condition: stable CODE STATUS: full Diet recommendation:  heart healthy   Brief/Interim Summary: Gary Carroll was admitted to the hospital with the working diagnosis of septic shock due to urine infection, complicated with respiratory failure due to acute pulmonary edema and atrial fibrillation with RVR.    75 year old male past medical history of hypertension, GERD, dyslipidemia, atrial fibrillation, and obesity class II who presented with fatigue, body aches, lower extremity edema and erythema.  Initially diagnosed with lower extremity cellulitis, received doxycycline and ceftriaxone and discharged home. The same day he was brought back to the emergency department by EMS due to altered mentation.  Apparently he was in the pharmacy getting his prescriptions when EMS was called.  On his initial physical examination he was febrile 102.1 F, blood pressure 80/60, heart rate 81, respiratory rate 28, oxygen saturation 97%.  His lungs were clear to auscultation, heart S1-S2, present, tachycardic, abdomen was soft, nontender, his lower extremities were cool, positive edema and bilateral erythema.   Sodium 136, potassium 4.2, chloride 105, bicarb 23, glucose 135, BUN 22, creatinine 1.0, lactic acid 3.0-2.6, white count 16.7, hemoglobin 13.8, hematocrit 42.3, platelets 162. SARS COVID-19 negative.   Urinalysis, specific gravity 1.017, 6-10 red cells,> 50 white cells.   His chest radiograph had no infiltrates.   EKG 90 bpm, normal axis,  normal intervals, sinus rhythm, V4-V6 ST depressions, no significant T wave changes.   Patient received aggressive volume resuscitation and broad-spectrum antibiotic therapy. Required vasopressors due to persistent shock. He developed pulmonary edema and required intubation with mechanical ventilation.   9/17 transfer from [facility] to [facility] for further management.   9/18 vasopressors were discontinued and patient was liberated from mechanical ventilation.   Transferred to [facility] 9/20.   02/08/21. Patient developed atrial fibrillation with rapid ventricular response, requiring diltiazem infusion.  Converted back to sinus rhythm.    Completed antibiotic therapy.  Pending transfer to [employer] for insurance authorization.  Septic shock due to urine infection, in the setting of chronic self-catheterization, urinary retention. Patient was initially admitted to the intensive care unit, he received vasopressors and broad-spectrum antibiotic therapy.Required invasive mechanical ventilation.His hemodynamics improved and he was successfully weaned off vasopressors and liberated from mechanical ventilation.His cultures remain no growth. CT of the abdomen pelvis performed 02/02/2021 showed no hydronephrosis or gross enlargement. Patient completed antibiotic therapy during his hospitalization. Patient very weak and deconditioned, physical therapy/Occupational Therapy have recommended skilled nursing facility.  2.  Acute hypoxic respiratory failure due to acute diastolic heart failure decompensation, volume overload.  Hypertension. Patient was diuresed successfully with intravenous furosemide. Negative fluid balance was achieved. Further work-up with echocardiography showed LV systolic function 55 to 32%. Patient continue taking losartan and furosemide. At discharge patient will resume losartan 50 mg daily.   3.  Paroxysmal atrial fibrillation.  During his hospitalization he developed rapid ventricular response  related to atrial fibrillation. He required intravenous diltiazem infusion converting to sinus rhythm. His metoprolol was increased from 25 to 50 mg twice daily, over last 24  hrs HR in the 55 range. Changed metoprolol back to 25 mg po bid. Continue anticoagulation with apixaban. Telemetry has been discontinued.   4.  Hypokalemia, non-anion gap metabolic acidosis.  His kidney function was closely monitored during his hospitalization. He received potassium chloride for electrolyte correction. At his discharge sodium 140, potassium 4.1, chloride 110, bicarb 22, glucose 124, BUN 22, creatinine 0.89.  5.  Dyslipidemia.  Continue rosuvastatin.  6.  Peripheral vascular disease.  Patient has chronic discoloration of lower extremities, nonpitting edema.  Venous insufficiency.At home uses compression stockings.  7.  Obesity class II, stage II sacral pressure ulcer, present on admission. Calculated BMI 37.7, continue local skin care.  8. Pulmonary nodule.  Positive left apical nodular infiltrate, positive enlarged mediastinal, bilateral hilar, retroperitoneal, and mesenteric lymph nodes. Patient will follow up with Dr Lamonte Sakai from pulmonary as outpatient.   PS: Patient has been stable medically since last several days and he was waiting for insurance approval to go to SNF.  Unfortunately, insurance denied approval.  We also tried to do peer to peer and I had to leave message with the front desk office of the company and then a voicemail for his insurance company.  I received a phone call after my second voicemail stating that the physician had tried to call me but I could not pick up the phone call and the physician never bothered to leave a voicemail to me for me to call back.  Without even talking to me and doing peer to peer, the case was closed.  The only option patient was left with by his insurance company was to do expedited appeal.  Patient was informed about this but patient was already so tired of  being in the hospital so long that he did not want to do that since he was aware that it would take a few more days for the company to make a decision.  Patient lives alone and he is requiring significant help so at this point in time, obviously he is not safe to go home however patient is very adamant and insistent on discharging him today.  Lengthy discussion with the patient where he was made aware that he will be at high risk of fall and consequences and possibly returning to the hospital.  He verbalized understanding and he believes that he is physically strong enough that he can take care of himself and that he does not want to go through expedited appeal process and waiting for another few days for his insurance company to make a decision.  Discharge Diagnoses:  Principal Problem:   Septic shock ([place]) Active Problems:   Essential hypertension, benign   Erectile dysfunction   AF (paroxysmal atrial fibrillation) (HCC)   Sepsis due to cellulitis (HCC)   BPH (benign prostatic hyperplasia)   Sleep apnea   Acute respiratory failure with hypoxia and hypercapnia (HCC)   Acute pulmonary edema (HCC)   Pressure injury of skin    Discharge Instructions   Allergies as of 02/16/2021       Reactions   Diovan [valsartan] Cough   Tagamet [cimetidine] Other (See Comments)   irrittability        Medication List     STOP taking these medications    ciprofloxacin-dexamethasone OTIC suspension Commonly known as: Ciprodex   doxycycline 100 MG capsule Commonly known as: VIBRAMYCIN   fluticasone 50 MCG/ACT nasal spray Commonly known as: FLONASE   Potassium Gluconate 2.5 MEQ Tabs   pravastatin 40 MG tablet  Commonly known as: PRAVACHOL       TAKE these medications    apixaban 5 MG Tabs tablet Commonly known as: ELIQUIS Take 1 tablet (5 mg total) by mouth 2 (two) times daily.   CALCIUM 500 +D PO Take by mouth.   diphenhydrAMINE 25 MG tablet Commonly known as: BENADRYL Take  50 mg by mouth at bedtime as needed for sleep.   Fish Oil 1000 MG Caps Take 4 capsules by mouth daily.   furosemide 40 MG tablet Commonly known as: LASIX Take 40 mg by mouth daily.   loratadine 10 MG tablet Commonly known as: CLARITIN Take 10 mg by mouth daily as needed.   losartan 50 MG tablet Commonly known as: COZAAR Take 50 mg by mouth daily.   MAGNESIUM PO Take by mouth.   melatonin 5 MG Tabs Take 10 mg by mouth at bedtime.   metoprolol tartrate 25 MG tablet Commonly known as: LOPRESSOR Take 1 tablet (25 mg total) by mouth 2 (two) times daily. What changed: additional instructions   Multivital tablet Take 1 tablet by mouth daily.   oxybutynin 5 MG tablet Commonly known as: DITROPAN Take 5 mg by mouth daily.   potassium chloride 10 MEQ tablet Commonly known as: KLOR-CON Take 1 tablet (10 mEq total) by mouth daily.   rosuvastatin 20 MG tablet Commonly known as: CRESTOR Take 20 mg by mouth daily.   terbinafine 1 % cream Commonly known as: LAMISIL APPLY MODERATE AMOUNT TO AFFECTED AREA TWICE A DAY   vitamin C 500 MG tablet Commonly known as: ASCORBIC ACID Take 500 mg by mouth daily.        Contact information for follow-up providers     Celene Squibb, MD Follow up in 1 week(s).   Specialty: Internal Medicine Contact information: [facility] [address] [phone]         Herminio Commons, MD .   Specialty: Cardiology Contact information: [place] [address] [phone]              Contact information for after-discharge care     [facility] .   Service: Skilled Nursing Contact information: [address] [address] [phone]                    Allergies  Allergen Reactions   Diovan [Valsartan] Cough   Tagamet [Cimetidine] Other (See Comments)    irrittability        Procedures/Studies: CT Chest W Contrast  Result Date: 02/02/2021 CLINICAL DATA:  Pneumonia, effusion, or abscess suspected EXAM: CT CHEST WITH CONTRAST TECHNIQUE: Multidetector CT imaging of the chest was performed during intravenous contrast administration. CONTRAST:  24mL OMNIPAQUE IOHEXOL 350 MG/ML SOLN COMPARISON:  No prior chest CT, correlation is made with radiographs 02/02/2021 FINDINGS: Cardiovascular: The heart is normal in size. No pericardial effusion. Coronary artery calcification. The aorta is normal in size with mild calcifications, which extend into the branch vessels. Two vessel arch. The main pulmonary artery measures up to 3.0 cm, at the upper limit of normal. Mediastinum/Nodes: Prominent mediastinal lymph nodes, including a right superior paratracheal lymph node that measures 1 cm in short axis (series 2, image 27), left prevascular lymph node that measures up to 1 cm in short axis (series 2, image 62), and a subcarinal lymph node that measures 1 cm in short axis (series 2, image 85).  Enlarged right hilar lymph node measures up to 1.4 cm in short axis. Prominent left hilar lymph node measures up to 1 cm in short axis (series 2, image 77). No axillary lymphadenopathy. Prominent subcentimeter paraesophageal lymph nodes Lungs/Pleura: Nodular opacities in the left apex (series 2, image 36). Mild dependent atelectasis. No pleural effusion or pneumothorax. Upper Abdomen: Multiple enlarged lymph nodes, including a 1.1 cm gastrohepatic lymph node (series 2, image 147), a 1 cm precaval lymph node (series 2, image 159), and a 1.7 cm porta hepatis lymph node (series 2, image 158). Nodularity of the left adrenal gland. Musculoskeletal: Multilevel degenerative changes, worst in the lower cervical spine. S shaped curvature. No acute or aggressive osseous finding. IMPRESSION: 1. Nodular opacities in the left apex, which are nonspecific but favored to be infectious or inflammatory, although a  neoplastic process could appear similar. 2. Prominent and enlarged mediastinal, bilateral hilar, retroperitoneal, and mesenteric lymph nodes. While the mediastinal and left hilar lymph nodes could be related to the left apical opacities, the bilaterality and abdominal lymphadenopathy suggest a more diffuse process. Consider CT of the abdomen and pelvis for further evaluation. 3. The main pulmonary artery is at the upper limit of normal in size, which can be seen in thesetting of pulmonary hypertension. 4. Aortic Atherosclerosis (ICD10-I70.0).  Coronary artherosclerosis. These results were called by telephone at the time of interpretation on 02/02/2021 at 6:12 pm to provider Fredia Sorrow , who verbally acknowledged these results. Electronically Signed   By: Merilyn Baba M.D.   On: 02/02/2021 21:15   CT Abdomen Pelvis W Contrast  Result Date: 02/02/2021 CLINICAL DATA:  Abdominal abscess or infection suspected. Chest CT showed adenopathy in the abdomen. Fever and body aches for 2 days. Bilateral leg swelling. EXAM: CT ABDOMEN AND PELVIS WITH CONTRAST TECHNIQUE: Multidetector CT imaging of the abdomen and pelvis was performed using the standard protocol following bolus administration of intravenous contrast. CONTRAST:  47mL OMNIPAQUE IOHEXOL 350 MG/ML SOLN COMPARISON:  CT chest 02/02/2021 FINDINGS: Lower chest: No acute abnormality. Prominent paraesophageal lymph nodes as indicated on previous chest CT. Hepatobiliary: No focal liver abnormality is seen. Status post cholecystectomy. No biliary dilatation. Pancreas: Unremarkable. No pancreatic ductal dilatation or surrounding inflammatory changes. Spleen: Normal in size without focal abnormality. Adrenals/Urinary Tract: Subcentimeter left adrenal gland nodule. Kidneys are symmetrical. No renal mass lesions or hydronephrosis. No hydroureter. Bladder wall is mildly thickened, possibly outlet obstruction or cystitis. Stomach/Bowel: Stomach, small bowel, and colon are  not abnormally distended. No wall thickening or inflammatory changes are appreciated. Vascular/Lymphatic: Calcification of the aorta. No aneurysm. Prominent lymph nodes in the gastrohepatic ligament, celiac axis, and retroperitoneum. Largest nodes measure about 11 mm short axis dimension. Bilateral pelvic iliac chain and groin lymph nodes are enlarged. Largest nodes in the right groin measure up to about 17 mm short axis dimension. Reproductive: Prostate gland is enlarged. Other: No free air or free fluid in the abdomen. Abdominal wall musculature appears intact. Musculoskeletal: Degenerative changes in the spine. Focal area of sclerosis in the right iliac bone likely represents a benign bone island. IMPRESSION: 1. Mildly enlarged but numerous lymph nodes are present in the para esophageal region, retroperitoneum, celiac axis, pelvis, and groin regions bilaterally. Changes are nonspecific and could represent reactive nodes, lymphoproliferative disorder, or metastatic lesions. 2. Prostate enlargement. Mildly thickened bladder wall could represent bladder outlet obstruction or cystitis. 3. Aortic atherosclerosis. Electronically Signed   By: Lucienne Capers M.D.   On: 02/02/2021 22:21   DG Chest [facility]  1 View  Result Date: 02/05/2021 CLINICAL DATA:  Acute respiratory failure. EXAM: PORTABLE CHEST 1 VIEW COMPARISON:  February 04, 2021. FINDINGS: Stable cardiomediastinal silhouette. Endotracheal and nasogastric tubes have been removed. No pneumothorax or pleural effusion is noted. Stable central pulmonary vascular congestion is noted with possible bilateral pulmonary edema. Bony thorax is unremarkable. IMPRESSION: Endotracheal and nasogastric tubes have been removed. Mild central pulmonary vascular congestion is noted with possible bibasilar pulmonary edema. Electronically Signed   By: Marijo Conception M.D.   On: 02/05/2021 08:04   DG Chest Port 1 View  Result Date: 02/04/2021 CLINICAL DATA:  Acute respiratory  failure EXAM: PORTABLE CHEST 1 VIEW COMPARISON:  February 03, 2021 FINDINGS: No pneumothorax. The ETT is in good position. The NG tube terminates below today's film. Increasing opacity in the left mid lower lung in the interval. Mild increased interstitial markings. The cardiomediastinal silhouette is stable. IMPRESSION: 1. Support apparatus as above. 2. Cardiomegaly and pulmonary venous congestion/mild edema. 3. Increasing opacity in the left mid and lower lung may represent layering effusion with atelectasis, developing infiltrate, or asymmetric edema. Recommend clinical correlation and attention on follow-up. Electronically Signed   By: Dorise Bullion III M.D.   On: 02/04/2021 08:12   DG Chest Portable 1 View  Result Date: 02/03/2021 CLINICAL DATA:  Endotracheal tube placement EXAM: PORTABLE CHEST 1 VIEW COMPARISON:  02/02/2021 FINDINGS: Endotracheal tube tip is 2.5 cm below the level of the clavicular heads and 2.6 cm above the inferior margin of the carina. Esophageal catheter side port is just beyond the gastroesophageal junction. There are unchanged bilateral interstitial opacities. IMPRESSION: Endotracheal tube tip 2.5 cm above the inferior margin of the carina. Electronically Signed   By: Ulyses Jarred M.D.   On: 02/03/2021 00:23   DG Chest Port 1 View  Result Date: 02/02/2021 CLINICAL DATA:  Fever, body aches and shortness of breath. EXAM: PORTABLE CHEST 1 VIEW COMPARISON:  Oct 04, 2017 FINDINGS: Mild, diffuse, chronic appearing increased lung markings are seen. There is no evidence of acute infiltrate, pleural effusion or pneumothorax. The heart size and mediastinal contours are within normal limits. The visualized skeletal structures are unremarkable. IMPRESSION: No acute cardiopulmonary disease. Electronically Signed   By: Virgina Norfolk M.D.   On: 02/02/2021 19:14   DG Chest Port 1V same Day  Result Date: 02/02/2021 CLINICAL DATA:  Shortness of breath. EXAM: PORTABLE CHEST 1 VIEW  COMPARISON:  February 02, 2021 (6:59 p.m.) FINDINGS: Moderate to marked severity diffusely increased interstitial lung markings are seen. Mildly increased interstitial lung markings are noted on the prior study. There is no evidence of focal consolidation, pleural effusion or pneumothorax. The heart size and mediastinal contours are within normal limits. The visualized skeletal structures are unremarkable. IMPRESSION: Moderate to marked severity interstitial edema. Electronically Signed   By: Virgina Norfolk M.D.   On: 02/02/2021 23:17   ECHOCARDIOGRAM COMPLETE  Result Date: 02/04/2021    ECHOCARDIOGRAM REPORT   Patient Name:   JAWANN URBANI Date of Exam: 02/04/2021 Medical Rec #:  [id number]          Height:       71.0 in Accession #:    [id number]         Weight:       270.7 lb Date of Birth:  01/06/1946         BSA:          2.398 m Patient Age:    38 years  BP:           124/74 mmHg Patient Gender: M                  HR:           69 bpm. Exam Location:  Inpatient Procedure: 2D Echo, Cardiac Doppler and Color Doppler Indications:    Atrial fibrillation  History:        Patient has prior history of Echocardiogram examinations, most                 recent 10/04/2017. Aortic Valve Disease; Risk                 Factors:Hypertension, Dyslipidemia, Current Smoker and Sleep                 Apnea. GERD.  Sonographer:    Clayton Lefort RDCS (AE) Referring Phys: [id number] [place]  1. Left ventricular ejection fraction, by estimation, is 55 to 60%. The left ventricle has normal function. The left ventricle has no regional wall motion abnormalities. Left ventricular diastolic parameters are indeterminate.  2. Right ventricular systolic function is normal. The right ventricular size is normal.  3. The mitral valve is grossly normal. Trivial mitral valve regurgitation.  4. The aortic valve is calcified. Aortic valve regurgitation is moderate. Moderate aortic valve stenosis. FINDINGS  Left  Ventricle: Left ventricular ejection fraction, by estimation, is 55 to 60%. The left ventricle has normal function. The left ventricle has no regional wall motion abnormalities. The left ventricular internal cavity size was normal in size. There is  no left ventricular hypertrophy. Left ventricular diastolic parameters are indeterminate. Right Ventricle: The right ventricular size is normal. Right vetricular wall thickness was not well visualized. Right ventricular systolic function is normal. Left Atrium: Left atrial size was normal in size. Right Atrium: Right atrial size was normal in size. Pericardium: There is no evidence of pericardial effusion. Mitral Valve: The mitral valve is grossly normal. Mild to moderate mitral annular calcification. Trivial mitral valve regurgitation. Tricuspid Valve: The tricuspid valve is grossly normal. Tricuspid valve regurgitation is trivial. Aortic Valve: The aortic valve is calcified. Aortic valve regurgitation is moderate. Aortic regurgitation PHT measures 391 msec. Moderate aortic stenosis is present. Aortic valve mean gradient measures 22.5 mmHg. Aortic valve peak gradient measures 40.7 mmHg. Aortic valve area, by VTI measures 1.30 cm. Pulmonic Valve: The pulmonic valve was grossly normal. Pulmonic valve regurgitation is not visualized. Aorta: The aortic root and ascending aorta are structurally normal, with no evidence of dilitation. IAS/Shunts: The atrial septum is grossly normal.  LEFT VENTRICLE PLAX 2D LVOT diam:     2.20 cm  Diastology LV SV:         102      LV e' medial:    5.18 cm/s LV SV Index:   42       LV E/e' medial:  15.3 LVOT Area:     3.80 cm LV e' lateral:   4.95 cm/s                         LV E/e' lateral: 16.0  RIGHT VENTRICLE             IVC RV Basal diam:  3.80 cm     IVC diam: 2.10 cm RV Mid diam:    3.30 cm RV S prime:     16.10 cm/s TAPSE (M-mode): 2.9 cm LEFT ATRIUM  Index       RIGHT ATRIUM           Index LA Vol (A2C):   53.7 ml 22.39  ml/m RA Area:     17.60 cm LA Vol (A4C):   54.7 ml 22.81 ml/m RA Volume:   50.20 ml  20.93 ml/m LA Biplane Vol: 59.2 ml 24.69 ml/m  AORTIC VALVE AV Area (Vmax):    1.15 cm AV Area (Vmean):   1.21 cm AV Area (VTI):     1.30 cm AV Vmax:           319.00 cm/s AV Vmean:          218.500 cm/s AV VTI:            0.782 m AV Peak Grad:      40.7 mmHg AV Mean Grad:      22.5 mmHg LVOT Vmax:         96.50 cm/s LVOT Vmean:        69.400 cm/s LVOT VTI:          0.268 m LVOT/AV VTI ratio: 0.34 AI PHT:            391 msec  AORTA Ao Root diam: 3.20 cm Ao Asc diam:  3.00 cm MITRAL VALVE MV Area (PHT): 2.71 cm    SHUNTS MV Decel Time: 280 msec    Systemic VTI:  0.27 m MV E velocity: 79.40 cm/s  Systemic Diam: 2.20 cm MV A velocity: 65.30 cm/s MV E/A ratio:  1.22 Mertie Moores MD Electronically signed by Mertie Moores MD Signature Date/Time: 02/04/2021/11:39:42 AM    Final      Subjective: Seen and examined.  He has no complaints.  Discharge Exam: Vitals:   02/15/21 2051 02/16/21 0407  BP: (!) 121/47 128/72  Pulse: 62 (!) 59  Resp: 20 14  Temp: 98.3 F (36.8 C) 98 F (36.7 C)  SpO2: 95% 96%   Vitals:   02/15/21 1652 02/15/21 2001 02/15/21 2051 02/16/21 0407  BP: (!) 116/45  (!) 121/47 128/72  Pulse: (!) 57 62 62 (!) 59  Resp: 16  20 14   Temp: 98.4 F (36.9 C)  98.3 F (36.8 C) 98 F (36.7 C)  TempSrc: Oral  Oral   SpO2: 96%  95% 96%  Weight:      Height:       General exam: Appears calm and comfortable  Respiratory system: Clear to auscultation. Respiratory effort normal. Cardiovascular system: S1 & S2 heard, RRR. No JVD, murmurs, rubs, gallops or clicks.  +1-2 pitting edema bilateral lower extremity. Gastrointestinal system: Abdomen is nondistended, soft and nontender. No organomegaly or masses felt. Normal bowel sounds heard. Central nervous system: Alert and oriented. No focal neurological deficits. Extremities: Symmetric 5 x 5 power. Skin: No rashes, lesions or ulcers.  Psychiatry:  Judgement and insight appear normal. Mood & affect appropriate.    The results of significant diagnostics from this hospitalization (including imaging, microbiology, ancillary and laboratory) are listed below for reference.     Microbiology: No results found for this or any previous visit (from the past 240 hour(s)).   Labs: BNP (last 3 results) Recent Labs    02/03/21 0608  BNP 628.3*   Basic Metabolic Panel: Recent Labs  Lab 02/15/21 0831  NA 137  K 4.1  CL 107  CO2 22  GLUCOSE 175*  BUN 22  CREATININE 0.92  CALCIUM 9.1   Liver Function Tests: No results for input(s): AST, ALT,  ALKPHOS, BILITOT, PROT, ALBUMIN in the last 168 hours. No results for input(s): LIPASE, AMYLASE in the last 168 hours. No results for input(s): AMMONIA in the last 168 hours. CBC: No results for input(s): WBC, NEUTROABS, HGB, HCT, MCV, PLT in the last 168 hours.  Cardiac Enzymes: No results for input(s): CKTOTAL, CKMB, CKMBINDEX, TROPONINI in the last 168 hours. BNP: Invalid input(s): POCBNP CBG: No results for input(s): GLUCAP in the last 168 hours. D-Dimer No results for input(s): DDIMER in the last 72 hours. Hgb A1c No results for input(s): HGBA1C in the last 72 hours. Lipid Profile No results for input(s): CHOL, HDL, LDLCALC, TRIG, CHOLHDL, LDLDIRECT in the last 72 hours. Thyroid function studies No results for input(s): TSH, T4TOTAL, T3FREE, THYROIDAB in the last 72 hours.  Invalid input(s): FREET3 Anemia work up No results for input(s): VITAMINB12, FOLATE, FERRITIN, TIBC, IRON, RETICCTPCT in the last 72 hours. Urinalysis    Component Value Date/Time   COLORURINE YELLOW 02/02/2021 1843   APPEARANCEUR HAZY (A) 02/02/2021 1843   LABSPEC 1.017 02/02/2021 1843   PHURINE 7.0 02/02/2021 1843   GLUCOSEU NEGATIVE 02/02/2021 1843   HGBUR NEGATIVE 02/02/2021 1843   BILIRUBINUR NEGATIVE 02/02/2021 1843   BILIRUBINUR neg 08/31/2012 1203   KETONESUR NEGATIVE 02/02/2021 1843    PROTEINUR NEGATIVE 02/02/2021 1843   UROBILINOGEN negative 08/31/2012 1203   UROBILINOGEN 0.2 05/31/2010 1125   NITRITE POSITIVE (A) 02/02/2021 1843   LEUKOCYTESUR MODERATE (A) 02/02/2021 1843   Sepsis Labs Invalid input(s): PROCALCITONIN,  WBC,  LACTICIDVEN Microbiology No results found for this or any previous visit (from the past 240 hour(s)).   Time coordinating discharge: 45 minutes  SIGNED:   Darliss Cheney, MD  [facility] 02/16/2021, 7:53 AM

## 2021-02-16 NOTE — TOC Transition Note (Signed)
Transition of Care [facility]) - CM/SW Discharge Note   Patient Details  Name: SHIHEEM CORPORAN MRN: [id number] Date of Birth: 04-11-1946  Transition of Care [facility]) CM/SW Contact:  Angelita Ingles, RN Phone Number:[phone]  02/16/2021, 1:01 PM   Clinical Narrative:    [place] has been set up with [place]. No other needs noted at this time TOC will sign off.    Final next level of care: [place] Barriers to Discharge: No Barriers Identified   Patient Goals and CMS Choice Patient states their goals for this hospitalization and ongoing recovery are:: Patient states that he is ready to go home CMS Medicare.gov Compare Post Acute Care list provided to:: Patient Choice offered to / list presented to : Patient  Discharge Placement                       Discharge Plan and Services In-house Referral: NA Discharge Planning Services: CM Consult Post Acute Care Choice: NA          DME Arranged: N/A DME Agency: NA       HH Arranged: PT, OT HH Agency: [place] ([facility]) Date HH Agency Contacted: 02/16/21 Time HH Agency Contacted: 1301    Social Determinants of Health (SDOH) Interventions     Readmission Risk Interventions Readmission Risk Prevention Plan 02/07/2021  Transportation Screening Complete  PCP or Specialist Appt within 5-7 Days Complete  Home Care Screening Complete  Medication Review (RN CM) Referral to Pharmacy  Some recent data might be hidden

## 2021-02-27 DIAGNOSIS — I480 Paroxysmal atrial fibrillation: Secondary | ICD-10-CM | POA: Diagnosis not present

## 2021-02-27 DIAGNOSIS — I5042 Chronic combined systolic (congestive) and diastolic (congestive) heart failure: Secondary | ICD-10-CM | POA: Diagnosis not present

## 2021-02-27 DIAGNOSIS — Z758 Other problems related to medical facilities and other health care: Secondary | ICD-10-CM | POA: Diagnosis not present

## 2021-03-14 ENCOUNTER — Ambulatory Visit (INDEPENDENT_AMBULATORY_CARE_PROVIDER_SITE_OTHER): Payer: No Typology Code available for payment source | Admitting: Cardiology

## 2021-03-14 ENCOUNTER — Other Ambulatory Visit: Payer: Self-pay

## 2021-03-14 ENCOUNTER — Encounter: Payer: Self-pay | Admitting: Cardiology

## 2021-03-14 DIAGNOSIS — I480 Paroxysmal atrial fibrillation: Secondary | ICD-10-CM

## 2021-03-14 DIAGNOSIS — I10 Essential (primary) hypertension: Secondary | ICD-10-CM | POA: Diagnosis not present

## 2021-03-14 DIAGNOSIS — R609 Edema, unspecified: Secondary | ICD-10-CM

## 2021-03-14 DIAGNOSIS — Z7901 Long term (current) use of anticoagulants: Secondary | ICD-10-CM

## 2021-03-14 DIAGNOSIS — I350 Nonrheumatic aortic (valve) stenosis: Secondary | ICD-10-CM | POA: Insufficient documentation

## 2021-03-14 NOTE — Assessment & Plan Note (Signed)
Continue with Eliquis 5 mg twice a day.  Continue to monitor CBC and creatinine.  No bleeding.

## 2021-03-14 NOTE — Assessment & Plan Note (Signed)
Okay to continue with furosemide 40 mg daily.  3+ lower extremity edema, chronic.  Elevate legs as necessary.

## 2021-03-14 NOTE — Patient Instructions (Signed)
Medication Instructions:   START TAKING METOPROLOL 25 MG TWICE A DAY   *If you need a refill on your cardiac medications before your next appointment, please call your pharmacy*   Lab Work: NONE ORDERED  TODAY   If you have labs (blood work) drawn today and your tests are completely normal, you will receive your results only by: [place] (if you have MyChart) OR A paper copy in the mail If you have any lab test that is abnormal or we need to change your treatment, we will call you to review the results.   Testing/Procedures: NONE ORDERED  TODAY     Follow-Up: At [facility], you and your health needs are our priority.  As part of our continuing mission to provide you with exceptional heart care, we have created designated Provider Care Teams.  These Care Teams include your primary Cardiologist (physician) and Advanced Practice Providers (APPs -  Physician Assistants and Nurse Practitioners) who all work together to provide you with the care you need, when you need it.  We recommend signing up for the patient portal called "MyChart".  Sign up information is provided on this After Visit Summary.  MyChart is used to connect with patients for Virtual Visits (Telemedicine).  Patients are able to view lab/test results, encounter notes, upcoming appointments, etc.  Non-urgent messages can be sent to your provider as well.   To learn more about what you can do with MyChart, go to [email or web address].    Your next appointment:    6 month(s)  The format for your next appointment:   In Person  Provider:   You will see one of the following Advanced Practice Providers on your designated Care Team:   [place], PA-C  Ermalinda Barrios, [place]      Other Instructions

## 2021-03-14 NOTE — Assessment & Plan Note (Signed)
Moderate aortic stenosis.  Would recommend repeating echocardiogram in 1 year.

## 2021-03-14 NOTE — Progress Notes (Signed)
Cardiology Office Note:    Date:  03/14/2021   ID:  Gary Carroll, DOB 04-19-1946, MRN [id number]  PCP:  Celene Squibb, MD   [facility] Providers Cardiologist:  Kate Sable, MD (Inactive)     Referring MD: Celene Squibb, MD    History of Present Illness:    Gary Carroll is a 75 y.o. male here for evaluation of atrial fibrillation after recent hospitalization with septic shock due to urinary tract infection complicated by respiratory failure with acute pulmonary edema.  Had a fever of 102.  Was tachycardic.  EKG showed heart rate of 90 bpm with ST depressions in V4 to V6.  Pressures were utilized.  Fluid overload was then noted after pulmonary needle was diagnosed.  He was diuresed with intravenous furosemide.  Thankfully, echocardiogram showed normal systolic function of 55 to 60%.  At discharge resumed 50 mg of losartan.  Continue with furosemide.  Paroxysmal atrial fibration was noted with rapid ventricular response during the hospitalization.  Metoprolol was increased to 50 mg twice a day.  Heart rate was in the 55 range.  It was then changed back to 25 twice daily.  He was treated with anticoagulation with Eliquis.  Continuing with Crestor.  Going [employer].   AFIB only noted in hospital and prior when asymptomatic.   Moderate AS - seen at [facility] as well.  Past Medical History:  Diagnosis Date   Allergic rhinitis    Arthritis    Asthma    seasonal per pt   Bilateral knee pain    OA   Diverticulosis 02/2012   seen on colonoscopy   Erectile dysfunction    GERD (gastroesophageal reflux disease)    Heart murmur    Hiatal hernia    HLD (hyperlipidemia)    HTN (hypertension)    Hydronephrosis determined by ultrasound 08/22/2015   Overactive bladder    Prediabetes    Ringing of ears    Sleep apnea    Urinary frequency    Urinary urgency    Wears glasses     Past Surgical History:  Procedure Laterality Date   CHOLECYSTECTOMY     COLONOSCOPY  02/2012    Dr. 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   Calcium Carb-Cholecalciferol (CALCIUM 500 +D PO) Take by mouth.   diphenhydrAMINE (BENADRYL) 25 MG tablet Take 50 mg by mouth at bedtime as needed for sleep.   furosemide (LASIX) 40 MG tablet Take 40 mg by mouth daily.   loratadine (CLARITIN) 10 MG tablet Take 10 mg by mouth daily as needed.   losartan (COZAAR) 50 MG tablet Take 50 mg by mouth daily.   MAGNESIUM PO Take by mouth.   Melatonin 5 MG TABS Take 10 mg by mouth at bedtime.    metoprolol tartrate (LOPRESSOR) 25 MG tablet Take 1 tablet (25 mg total) by mouth 2 (two) times daily.   Multiple Vitamins-Minerals (MULTIVITAL) tablet Take 1  tablet by mouth daily.   Omega-3 Fatty Acids (FISH OIL) 1000 MG CAPS Take 4 capsules by mouth daily.    oxybutynin (DITROPAN) 5 MG tablet Take 5 mg by mouth daily.   potassium chloride (K-DUR) 10 MEQ tablet Take 1 tablet (10 mEq total) by mouth daily.   rosuvastatin (CRESTOR) 20 MG tablet Take 20 mg by mouth daily.   terbinafine (LAMISIL) 1 % cream APPLY MODERATE AMOUNT TO AFFECTED AREA TWICE A DAY   vitamin C (ASCORBIC ACID) 500 MG tablet Take 500 mg by mouth daily.     Allergies:   Diovan [valsartan] and Tagamet [cimetidine]   Social History   Socioeconomic History   Marital status: Divorced    Spouse name: Not on file   Number of children: 0   Years of education: Not on file   Highest education level: Not on file  Occupational History    Occupation: retired [employer]    Employer: RETIRED  Tobacco Use   Smoking status: Former    Types: Cigarettes    Quit date: 01/18/2010    Years since quitting: 11.1   Smokeless tobacco: Never   Tobacco comments:    Smoked for 25 years- quit September 2011.  Passive exposure from wife  Vaping Use   Vaping Use: Never used  Substance and Sexual Activity   Alcohol use: Not Currently    Alcohol/week: 1.0 - 2.0 standard drink    Types: 1 - 2 Glasses of wine per week    Comment: Stopped drinking 1-2 glasses of wine daily in 2017    Drug use: No   Sexual activity: Not Currently  Other Topics Concern   Not on file  Social History Narrative   Separated from wife (2014) and moved to [place]. Retired- worked in Theatre manager.    Social Determinants of Health   Financial Resource Strain: Not on file  Food Insecurity: Not on file  Transportation Needs: Not on file  Physical Activity: Not on file  Stress: Not on file  Social Connections: Not on file     Family History: The patient's family history includes Cancer in his mother; Coronary artery disease in his father and paternal grandfather; Heart disease in his paternal grandfather; Uterine cancer in his mother. There is no history of Colon cancer, Esophageal cancer, Stomach cancer, Rectal cancer, or Liver disease.  ROS:   Please see the history of present illness.     All other systems reviewed and are negative.  EKGs/Labs/Other Studies Reviewed:    The following studies were reviewed today:  ECHO 02/04/21:  1. Left ventricular ejection fraction, by estimation, is 55 to 60%. The  left ventricle has normal function. The left ventricle has no regional  wall motion abnormalities. Left ventricular diastolic parameters are  indeterminate.   2. Right ventricular systolic function is normal. The right ventricular  size is normal.   3. The mitral valve is grossly normal. Trivial mitral valve  regurgitation.   4. The aortic valve is  calcified. Aortic valve regurgitation is moderate.  Moderate aortic valve stenosis.  Prior EKG shows A. fib with RVR Recent Labs: 02/03/2021: ALT 26; B Natriuretic Peptide 837.1 02/08/2021: Magnesium 2.2; TSH 1.443 02/09/2021: Hemoglobin 12.9; Platelets 219 02/15/2021: BUN 22; Creatinine, Ser 0.92; Potassium 4.1; Sodium 137  Recent Lipid Panel    Component Value Date/Time   CHOL 139 02/06/2012 0929   TRIG 92 02/06/2012 0929   HDL 35 (L) 02/06/2012 0929   CHOLHDL 4.0 02/06/2012 0929   VLDL  18 02/06/2012 0929   LDLCALC 86 02/06/2012 0929     Risk Assessment/Calculations:          Physical Exam:    VS:  BP 128/82   Pulse 77   Ht 5\' 11"  (1.803 m)   Wt 271 lb (122.9 kg)   SpO2 96%   BMI 37.80 kg/m     Wt Readings from Last 3 Encounters:  03/14/21 271 lb (122.9 kg)  02/03/21 270 lb 11.6 oz (122.8 kg)  02/01/21 272 lb (123.4 kg)     GEN:  Well nourished, well developed in no acute distress HEENT: Normal NECK: No JVD; No carotid bruits LYMPHATICS: No lymphadenopathy CARDIAC: RRR, 3/6 SM, no rubs, gallops RESPIRATORY:  Clear to auscultation without rales, wheezing or rhonchi  ABDOMEN: Soft, non-tender, non-distended MUSCULOSKELETAL:  3+ edema; No deformity  SKIN: Warm and dry NEUROLOGIC:  Alert and oriented x 3 PSYCHIATRIC:  Normal affect   ASSESSMENT:    1. AF (paroxysmal atrial fibrillation) ([place])   2. Chronic anticoagulation   3. Essential hypertension, benign   4. Peripheral edema   5. Nonrheumatic aortic valve stenosis    PLAN:    In order of problems listed above:  AF (paroxysmal atrial fibrillation) (HCC) Currently in sinus rhythm.  Overall well-controlled heart rate.  Okay to continue with metoprolol 25 mg twice a day.  Chronic anticoagulation Continue with Eliquis 5 mg twice a day.  Continue to monitor CBC and creatinine.  No bleeding.  Essential hypertension, benign Okay to continue with losartan 50 mg daily.  Also on metoprolol  low-dose.  Peripheral edema Okay to continue with furosemide 40 mg daily.  3+ lower extremity edema, chronic.  Elevate legs as necessary.  Aortic stenosis Moderate aortic stenosis.  Would recommend repeating echocardiogram in 1 year.      Medication Adjustments/Labs and Tests Ordered: Current medicines are reviewed at length with the patient today.  Concerns regarding medicines are outlined above.  No orders of the defined types were placed in this encounter.  No orders of the defined types were placed in this encounter.   Patient Instructions  Medication Instructions:   START TAKING METOPROLOL 25 MG TWICE A DAY   *If you need a refill on your cardiac medications before your next appointment, please call your pharmacy*   Lab Work: NONE ORDERED  TODAY   If you have labs (blood work) drawn today and your tests are completely normal, you will receive your results only by: [place] (if you have MyChart) OR A paper copy in the mail If you have any lab test that is abnormal or we need to change your treatment, we will call you to review the results.   Testing/Procedures: NONE ORDERED  TODAY     Follow-Up: At [facility], you and your health needs are our priority.  As part of our continuing mission to provide you with exceptional heart care, we have created designated Provider Care Teams.  These Care Teams include your primary Cardiologist (physician) and Advanced Practice Providers (APPs -  Physician Assistants and Nurse Practitioners) who all work together to provide you with the care you need, when you need it.  We recommend signing up for the patient portal called "MyChart".  Sign up information is provided on this After Visit Summary.  MyChart is used to connect with patients for Virtual Visits (Telemedicine).  Patients are able to view lab/test results, encounter notes, upcoming appointments, etc.  Non-urgent messages can be sent to your provider  as well.   To  learn more about what you can do with MyChart, go to [email or web address].    Your next appointment:    6 month(s)  The format for your next appointment:   In Person  Provider:   You will see one of the following Advanced Practice Providers on your designated Care Team:   Bernerd Pho, PA-C  Ermalinda Barrios, [place]      Other Instructions   Signed, Candee Furbish, MD  03/14/2021 4:59 PM    [place]

## 2021-03-14 NOTE — Assessment & Plan Note (Signed)
Okay to continue with losartan 50 mg daily.  Also on metoprolol low-dose.

## 2021-03-14 NOTE — Assessment & Plan Note (Signed)
Currently in sinus rhythm.  Overall well-controlled heart rate.  Okay to continue with metoprolol 25 mg twice a day.

## 2021-03-19 DIAGNOSIS — B356 Tinea cruris: Secondary | ICD-10-CM | POA: Insufficient documentation

## 2021-03-19 DIAGNOSIS — I480 Paroxysmal atrial fibrillation: Secondary | ICD-10-CM | POA: Diagnosis not present

## 2021-03-19 DIAGNOSIS — I4891 Unspecified atrial fibrillation: Secondary | ICD-10-CM | POA: Diagnosis not present

## 2021-03-19 DIAGNOSIS — R195 Other fecal abnormalities: Secondary | ICD-10-CM | POA: Diagnosis not present

## 2021-03-22 ENCOUNTER — Ambulatory Visit (INDEPENDENT_AMBULATORY_CARE_PROVIDER_SITE_OTHER)
Admission: RE | Admit: 2021-03-22 | Discharge: 2021-03-22 | Disposition: A | Discharge status: Home / Self Care | Payer: No Typology Code available for payment source | Source: Ambulatory Visit | Attending: Emergency Medicine | Admitting: Emergency Medicine

## 2021-03-22 ENCOUNTER — Other Ambulatory Visit: Payer: Self-pay

## 2021-03-22 DIAGNOSIS — R9389 Abnormal findings on diagnostic imaging of other specified body structures: Secondary | ICD-10-CM | POA: Diagnosis not present

## 2021-03-22 DIAGNOSIS — R911 Solitary pulmonary nodule: Secondary | ICD-10-CM | POA: Diagnosis not present

## 2021-03-23 ENCOUNTER — Telehealth: Payer: Self-pay | Admitting: Emergency Medicine

## 2021-03-23 NOTE — Telephone Encounter (Signed)
Received call report from [facility] with [facility] radiology. CT report is available via epic.  Dr. Lamonte Sakai, please advise. Thanks

## 2021-03-23 NOTE — Telephone Encounter (Signed)
I have reviewed, will contact the patient

## 2021-03-26 NOTE — Telephone Encounter (Signed)
Please call the patient and let him know that I have reviewed his CT chest. It still shows some areas of inflammation at the tops of his lungs. I'd like to see him in office to discuss. Thanks.

## 2021-03-26 NOTE — Telephone Encounter (Signed)
ATC patient, LMTCB 

## 2021-03-27 NOTE — Telephone Encounter (Signed)
Spoke with pt and notified of results per Dr. Lamonte Sakai. Pt verbalized understanding and denied any questions. Pt scheduled with Dr Lamonte Sakai for 03/30/21 and is aware to keep this appt.

## 2021-03-30 ENCOUNTER — Encounter: Payer: Self-pay | Admitting: Emergency Medicine

## 2021-03-30 ENCOUNTER — Ambulatory Visit (INDEPENDENT_AMBULATORY_CARE_PROVIDER_SITE_OTHER): Payer: No Typology Code available for payment source | Admitting: Emergency Medicine

## 2021-03-30 ENCOUNTER — Other Ambulatory Visit: Payer: Self-pay

## 2021-03-30 VITALS — BP 140/62 | HR 64 | Temp 98.1°F | Ht 71.0 in | Wt 262.0 lb

## 2021-03-30 DIAGNOSIS — R9389 Abnormal findings on diagnostic imaging of other specified body structures: Secondary | ICD-10-CM | POA: Diagnosis not present

## 2021-03-30 NOTE — Patient Instructions (Signed)
We reviewed your CT scans of the chest today.  There are enlarged lymph nodes in the middle of the chest.  The areas that we have been following in the left upper lobe have enlarged compared with your prior scans.  In addition there is a new pulmonary nodule at the top of the left lung.  These findings are suspicious for either infection, inflammation or possible lung cancer. I recommended that you undergo a PET scan to better evaluate the left lung findings.  Please talk to Dr. Mateo Flow about this at the [facility] so that it can be arranged.  If there is suspicion following the PET scan then you can be referred back to Dr. Lamonte Sakai to consider further evaluation including possible biopsy.

## 2021-03-30 NOTE — Progress Notes (Signed)
Subjective:    Patient ID: Gary Carroll, male    DOB: December 27, 1945, 75 y.o.   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   He reports that he has felt well. 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 Feels that he breathes well, he swims a lot. He has some cough, paroxysmal - often assoc w sneezing. Sometimes assoc w meals and reflux - not every day.  Non-productive. He tires easily.   He participates in the lung cancer screening program through the [place].  He had some micronodular disease noted on a low-dose CT 05/2020 that prompted a round of antibiotics.  His most recent lung cancer screening CT was in May as below.   LDCT 09/19/2020, report available for review.  I do not have the images.  Small foci.  Bronchovascular nodular consolidation in left upper lobe have decreased in size compared with January 2022.  There was note made of a few new small focal nodular areas in the left upper lobe up to 5 mm in size.  A repeat scan was recommended for 3 months (August 2022).   ROV 02/01/21 --follow-up visit 75 year old man with history of tobacco use, no real documented COPD although he is at risk for this.  He does have a history of OSA on CPAP, GERD, atrial fibrillation, hypertension, aortic stenosis, seasonal allergies.  He participates in the lung cancer screening program through the [place].  We repeated his CT chest at the [facility] 12/2020 to follow waxing and waning nodular foci, up to 5 mm in the left upper lobe. He has seen and reviewed with Dr Mateo Flow at the [facility]. He has a repeat set for November.   CT chest 12/20/20 from the [place], reviewed by me, some stable scattered old granulomatous disease with some calcification, some waxing and waning nodular consolidation in the left upper lobe, slightly increased in size but with some shifting  distribution that is suggestive of possible atypical mycobacterial infection.    ROV 03/30/21 -- 75 yo man known history tobacco use, OSA on CPAP, GERD, A. fib, hypertension, aortic stenosis, allergies.  He is at risk for obstructive lung disease although we have never really established this.  He participates in the lung cancer screening program to the [place] and has had waxing and waning nodular disease superimposed on some granulomatous nodules and calcified nodules. His most recent 2 scans were done 02/02/2021 and 03/22/2021, first done when he was admitted with urosepsis, septic shock, acute respiratory failure.   CT chest 02/02/2021 (while hospitalized) reviewed by me shows prominent mediastinal lymphadenopathy, enlarged right hilar node, left hilar node, paraesophageal nodes.  There is some left apical nodular opacity present  CT chest 03/22/2021 reviewed by me shows persistent prominent mediastinal nodes, slightly smaller than 2 months prior.  Persistent left upper lobe nodular process.  Adjacent nodules 14 mm, 12 mm both larger.  There is a left apical bilobed nodule 2.1 cm    Review of Systems As per HPI  Past Medical History:  Diagnosis Date   Allergic rhinitis    Arthritis    Asthma    seasonal per pt   Bilateral knee pain    OA   Diverticulosis 02/2012   seen on colonoscopy   Erectile dysfunction    GERD (gastroesophageal reflux disease)    Heart murmur  Hiatal hernia    HLD (hyperlipidemia)    HTN (hypertension)    Hydronephrosis determined by ultrasound 08/22/2015   Overactive bladder    Prediabetes    Ringing of ears    Sleep apnea    Urinary frequency    Urinary urgency    Wears glasses      Family History  Problem Relation Age of Onset   Uterine cancer Mother    Cancer Mother        uterine   Coronary artery disease Father    Coronary artery disease Paternal Grandfather    Heart disease Paternal Grandfather    Colon cancer Neg Hx    Esophageal cancer Neg  Hx    Stomach cancer Neg Hx    Rectal cancer Neg Hx    Liver disease Neg Hx      Social History   Socioeconomic History   Marital status: Divorced    Spouse name: Not on file   Number of children: 0   Years of education: Not on file   Highest education level: Not on file  Occupational History   Occupation: retired Land)    Employer: RETIRED  Tobacco Use   Smoking status: Former    Packs/day: 1.00    Types: Cigarettes    Quit date: 01/18/2010    Years since quitting: 11.2   Smokeless tobacco: Never   Tobacco comments:    Smoked for 25 years- quit September 2011.  Passive exposure from wife  Vaping Use   Vaping Use: Never used  Substance and Sexual Activity   Alcohol use: Not Currently    Alcohol/week: 1.0 - 2.0 standard drink    Types: 1 - 2 Glasses of wine per week    Comment: Stopped drinking 1-2 glasses of wine daily in 2017    Drug use: No   Sexual activity: Not Currently  Other Topics Concern   Not on file  Social History Narrative   Separated from wife (2014) and moved to [place]. Retired- worked in Theatre manager.    Social Determinants of Health   Financial Resource Strain: Not on file  Food Insecurity: Not on file  Transportation Needs: Not on file  Physical Activity: Not on file  Stress: Not on file  Social Connections: Not on file  Intimate Partner Violence: Not on file    No family hx lung CA  Allergies  Allergen Reactions   Diovan [Valsartan] Cough   Tagamet [Cimetidine] Other (See Comments)    irrittability     Outpatient Medications Prior to Visit  Medication Sig Dispense Refill   apixaban (ELIQUIS) 5 MG TABS tablet Take 1 tablet (5 mg total) by mouth 2 (two) times daily. 60 tablet 2   Calcium Carb-Cholecalciferol (CALCIUM 500 +D PO) Take by mouth.     diphenhydrAMINE (BENADRYL) 25 MG tablet Take 50 mg by mouth at bedtime as needed for sleep.     furosemide (LASIX) 40 MG tablet Take 40 mg by mouth daily.     loratadine (CLARITIN) 10  MG tablet Take 10 mg by mouth daily as needed.     losartan (COZAAR) 50 MG tablet Take 50 mg by mouth daily.     MAGNESIUM PO Take by mouth.     Melatonin 5 MG TABS Take 10 mg by mouth at bedtime.      metoprolol tartrate (LOPRESSOR) 25 MG tablet Take 1 tablet (25 mg total) by mouth 2 (two) times daily. 60 tablet 2   Multiple Vitamins-Minerals (MULTIVITAL)  tablet Take 1 tablet by mouth daily.     Omega-3 Fatty Acids (FISH OIL) 1000 MG CAPS Take 4 capsules by mouth daily.      oxybutynin (DITROPAN) 5 MG tablet Take 5 mg by mouth daily.     potassium chloride (K-DUR) 10 MEQ tablet Take 1 tablet (10 mEq total) by mouth daily. 30 tablet 0   rosuvastatin (CRESTOR) 20 MG tablet Take 20 mg by mouth daily.     terbinafine (LAMISIL) 1 % cream APPLY MODERATE AMOUNT TO AFFECTED AREA TWICE A DAY     vitamin C (ASCORBIC ACID) 500 MG tablet Take 500 mg by mouth daily.     No facility-administered medications prior to visit.   Was in the [employer], flight deck, maintenance.  Has lived in [place], [place] No inhaled exposures.        Objective:   Physical Exam  Vitals:   03/30/21 1628  BP: 140/62  Pulse: 64  Temp: 98.1 F (36.7 C)  TempSrc: Oral  SpO2: 97%  Weight: 262 lb (118.8 kg)  Height: 5\' 11"  (1.803 m)   Gen: Pleasant, obese man, in no distress,  normal affect  ENT: No lesions,  mouth clear,  oropharynx clear, no postnasal drip  Neck: No JVD, no stridor  Lungs: No use of accessory muscles, no crackles or wheezing on normal respiration, no wheeze on forced expiration  Cardiovascular: RRR, harsh holosystolic murmur with intact S2.  2+ bilateral lower extremity edema with compression hose in place  Musculoskeletal: No deformities, no cyanosis or clubbing  Neuro: alert, awake, non focal  Skin: Warm, no lesions or rash      Assessment & Plan:  Abnormal CT of the chest Has been a waxing and waning nature to his left upper lobe nodular disease on serial CTs going back to January  2022 at the [place].  That said his most recent scan looks like progression and I am concerned about the risk for malignancy.  I will go and order a PET scan here but he is about to lose his community referral and needs to go back to the [place].  I will send a message to his pulmonologist there to work on getting this done.  If suspicion remains and we feel he needs bronchoscopy, cultures, biopsies then they will refer back to me.   We reviewed your CT scans of the chest today.  There are enlarged lymph nodes in the middle of the chest.  The areas that we have been following in the left upper lobe have enlarged compared with your prior scans.  In addition there is a new pulmonary nodule at the top of the left lung.  These findings are suspicious for either infection, inflammation or possible lung cancer. I recommended that you undergo a PET scan to better evaluate the left lung findings.  Please talk to Dr. Mateo Flow about this at the [facility] so that it can be arranged.  If there is suspicion following the PET scan then you can be referred back to Dr. Lamonte Sakai to consider further evaluation including possible biopsy.  Baltazar Apo, MD, PhD 03/30/2021, 5:13 PM [facility] [phone] or if no answer before 7:00PM call [phone] For any issues after 7:00PM please call eLink [phone]

## 2021-03-30 NOTE — Assessment & Plan Note (Addendum)
Has been a waxing and waning nature to his left upper lobe nodular disease on serial CTs going back to January 2022 at the [place].  That said his most recent scan looks like progression and I am concerned about the risk for malignancy.  I will go and order a PET scan here but he is about to lose his community referral and needs to go back to the [place].  I will send a message to his pulmonologist there to work on getting this done.  If suspicion remains and we feel he needs bronchoscopy, cultures, biopsies then they will refer back to me.   We reviewed your CT scans of the chest today.  There are enlarged lymph nodes in the middle of the chest.  The areas that we have been following in the left upper lobe have enlarged compared with your prior scans.  In addition there is a new pulmonary nodule at the top of the left lung.  These findings are suspicious for either infection, inflammation or possible lung cancer. I recommended that you undergo a PET scan to better evaluate the left lung findings.  Please talk to Dr. Mateo Flow about this at the [facility] so that it can be arranged.  If there is suspicion following the PET scan then you can be referred back to Dr. Lamonte Sakai to consider further evaluation including possible biopsy.

## 2021-04-04 DIAGNOSIS — H6693 Otitis media, unspecified, bilateral: Secondary | ICD-10-CM | POA: Diagnosis not present

## 2021-04-04 DIAGNOSIS — H9202 Otalgia, left ear: Secondary | ICD-10-CM | POA: Insufficient documentation

## 2021-04-04 DIAGNOSIS — H6690 Otitis media, unspecified, unspecified ear: Secondary | ICD-10-CM | POA: Insufficient documentation

## 2021-04-06 ENCOUNTER — Other Ambulatory Visit: Payer: Self-pay | Admitting: Physician Assistant

## 2021-04-06 ENCOUNTER — Other Ambulatory Visit (HOSPITAL_COMMUNITY): Payer: Self-pay | Admitting: Physician Assistant

## 2021-04-06 DIAGNOSIS — R918 Other nonspecific abnormal finding of lung field: Secondary | ICD-10-CM

## 2021-04-15 ENCOUNTER — Other Ambulatory Visit: Payer: Self-pay

## 2021-04-15 ENCOUNTER — Encounter: Payer: Self-pay | Admitting: Emergency Medicine

## 2021-04-15 ENCOUNTER — Ambulatory Visit
Admission: EM | Admit: 2021-04-15 | Discharge: 2021-04-15 | Disposition: A | Discharge status: Home / Self Care | Payer: No Typology Code available for payment source | Attending: Family Medicine | Admitting: Family Medicine

## 2021-04-15 DIAGNOSIS — J209 Acute bronchitis, unspecified: Secondary | ICD-10-CM

## 2021-04-15 MED ORDER — PROMETHAZINE-DM 6.25-15 MG/5ML PO SYRP: 5.0000 mL | ORAL_SOLUTION | Freq: Three times a day (TID) | ORAL | 0 refills | Status: DC | PRN

## 2021-04-15 MED ORDER — PREDNISONE 20 MG PO TABS: 20.0000 mg | ORAL_TABLET | Freq: Every day | ORAL | 0 refills | Status: AC

## 2021-04-15 NOTE — Discharge Instructions (Signed)
If any of your symptoms worsen or become severe go immediately to the nearest emergency department.  Complete all medication as prescribed.

## 2021-04-15 NOTE — ED Provider Notes (Signed)
[facility]    CSN: [id number] Arrival date & time: 04/15/21  0827      History   Chief Complaint No chief complaint on file.   HPI [name] is a 75 y.o. male.   HPI Patient with a history of asthma, hypertension, obesity presents today with 1 week of worsening cough with some production, intermittent shortness of breath, nasal drainage, and itchy eyes.  Patient completed a course of antibiotics for treatment of an inner ear infection on 04/13/2021.  He reports symptoms have been present for 1 week.  Unaware of any known sick contacts.  Denies fever.  Endorses generally feeling unwell.  Past Medical History:  Diagnosis Date   Allergic rhinitis    Arthritis    Asthma    seasonal per pt   Bilateral knee pain    OA   Diverticulosis 02/2012   seen on colonoscopy   Erectile dysfunction    GERD (gastroesophageal reflux disease)    Heart murmur    Hiatal hernia    HLD (hyperlipidemia)    HTN (hypertension)    Hydronephrosis determined by ultrasound 08/22/2015   Overactive bladder    Prediabetes    Ringing of ears    Sleep apnea    Urinary frequency    Urinary urgency    Wears glasses     Patient Active Problem List   Diagnosis Date Noted   Chronic anticoagulation 03/14/2021   Aortic stenosis 03/14/2021   Pressure injury of skin 02/03/2021   Septic shock (HCC)    Acute respiratory failure with hypoxia and hypercapnia (HCC)    Acute pulmonary edema (HCC)    Abnormal CT of the chest 10/23/2020   History of tobacco use 10/23/2020   Chronic knee pain 03/19/2018   Chronic postoperative pain 03/19/2018   Elevated transaminase level    Sleep apnea 10/01/2017   BPH (benign prostatic hyperplasia) 02/04/2017   Urinary tract infection 02/04/2017   Cellulitis of left lower extremity 02/03/2017   GERD (gastroesophageal reflux disease) 02/03/2017   AF (paroxysmal atrial fibrillation) ([place]) 02/03/2017   Sepsis due to cellulitis ([place]) 02/03/2017   New  onset atrial fibrillation ([place]) 08/24/2015   Diuresis excessive 08/22/2015   Bladder outlet obstruction 08/22/2015   Hydronephrosis determined by ultrasound 08/22/2015   AKI (acute kidney injury) ([place]) 08/22/2015   Hypernatremia 08/22/2015   Acute blood loss anemia 08/22/2015   Hematuria 08/22/2015   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 Lorre Nick)   TOTAL KNEE ARTHROPLASTY Right 06/19/2015   Procedure: TOTAL KNEE ARTHROPLASTY;  Surgeon: Vickey Huger, MD;  Location: [facility];  Service: Orthopedics;  Laterality: Right;       Home Medications    Prior to Admission medications   Medication Sig Start Date End Date Taking?  Authorizing Provider  predniSONE (DELTASONE) 20 MG tablet Take 1 tablet (20 mg total) by mouth daily with breakfast for 5 days. 04/15/21 04/20/21 Yes Scot Jun, FNP  promethazine-dextromethorphan (PROMETHAZINE-DM) 6.25-15 MG/5ML syrup Take 5 mLs by mouth 3 (three) times daily as needed for cough. 04/15/21  Yes Scot Jun, FNP  apixaban (ELIQUIS) 5 MG TABS tablet Take 1 tablet (5 mg total) by mouth 2 (two) times daily. 08/24/15   Isaac Bliss, Rayford Halsted, MD  Calcium Carb-Cholecalciferol (CALCIUM 500 +D PO) Take by mouth.    [provider]  diphenhydrAMINE  (BENADRYL) 25 MG tablet Take 50 mg by mouth at bedtime as needed for sleep.    [provider]  furosemide (LASIX) 40 MG tablet Take 40 mg by mouth daily. 07/27/19   [provider]  loratadine (CLARITIN) 10 MG tablet Take 10 mg by mouth daily as needed.    [provider]  losartan (COZAAR) 50 MG tablet Take 50 mg by mouth daily.    [provider]  MAGNESIUM PO Take by mouth.    [provider]  Melatonin 5 MG TABS Take 10 mg by mouth at bedtime.     [provider]  metoprolol tartrate (LOPRESSOR) 25 MG tablet Take 1 tablet (25 mg total) by mouth 2 (two) times daily. 08/24/15   Isaac Bliss, Rayford Halsted, MD  Multiple Vitamins-Minerals (MULTIVITAL) tablet Take 1 tablet by mouth daily.    [provider]  Omega-3 Fatty Acids (FISH OIL) 1000 MG CAPS Take 4 capsules by mouth daily.     [provider]  oxybutynin (DITROPAN) 5 MG tablet Take 5 mg by mouth daily. 06/01/19   [provider]  potassium chloride (K-DUR) 10 MEQ tablet Take 1 tablet (10 mEq total) by mouth daily. 10/09/17   Kathie Dike, MD  rosuvastatin (CRESTOR) 20 MG tablet Take 20 mg by mouth daily.    [provider]  terbinafine (LAMISIL) 1 % cream APPLY MODERATE AMOUNT TO AFFECTED AREA TWICE A DAY 12/13/20   [provider]  vitamin C (ASCORBIC ACID) 500 MG tablet Take 500 mg by mouth daily.    [provider]    Family History Family History  Problem Relation Age of Onset   Uterine cancer Mother    Cancer Mother        uterine   Coronary artery disease Father    Coronary artery disease Paternal Grandfather    Heart disease Paternal Grandfather    Colon cancer Neg Hx    Esophageal cancer Neg Hx    Stomach cancer Neg Hx    Rectal cancer Neg Hx    Liver disease Neg Hx     Social History Social History   Tobacco Use   Smoking status: Former    Packs/day: 1.00    Types: Cigarettes    Quit date: 01/18/2010    Years  since quitting: 11.2   Smokeless tobacco: Never   Tobacco comments:    Smoked for 25 years- quit September 2011.  Passive exposure from wife  Vaping Use   Vaping Use: Never used  Substance Use Topics   Alcohol use: Not Currently    Alcohol/week: 1.0 - 2.0 standard drink    Types: 1 - 2 Glasses of wine per week    Comment: Stopped drinking 1-2 glasses of wine daily in 2017    Drug use: No     Allergies   Diovan [valsartan] and Tagamet [cimetidine]   Review  of Systems Review of Systems Pertinent negatives listed in HPI  Physical Exam Triage Vital Signs ED Triage Vitals  Enc Vitals Group     BP 04/15/21 0851 (!) 169/68     Pulse Rate 04/15/21 0851 67     Resp 04/15/21 0851 20     Temp 04/15/21 0851 98.2 F (36.8 C)     Temp Source 04/15/21 0851 Oral     SpO2 04/15/21 0851 97 %     Weight --      Height --      Head Circumference --      Peak Flow --      Pain Score 04/15/21 0852 0     Pain Loc --      Pain Edu? --      Excl. in [place]? --    No data found.  Updated Vital Signs BP (!) 169/68 (BP Location: Right Arm)   Pulse 67   Temp 98.2 F (36.8 C) (Oral)   Resp 20   SpO2 97%   Visual Acuity Right Eye Distance:   Left Eye Distance:   Bilateral Distance:    Right Eye Near:   Left Eye Near:    Bilateral Near:     Physical Exam  General appearance: alert, Ill-appearing, no distress Head: Normocephalic, without obvious abnormality, atraumatic ENT: TMs normal, nares with mucosal edema, congestion, oropharynx erythematous without exudate Respiratory: Respirations even , unlabored, coarse lung sound with rhonchi and expiratory wheeze Heart: Rate and rhythm normal. No gallop or murmurs noted on exam  Abdomen: BS +, no distention, no rebound tenderness, or no mass Extremities: No gross deformities Skin: Skin color, texture, turgor normal. No rashes seen  Psych: Appropriate mood and affect. Neurologic: No obvious focal neurological abnormalitie present on  exam UC Treatments / Results  Labs (all labs ordered are listed, but only abnormal results are displayed) Labs Reviewed - No data to display  EKG   Radiology No results found.  Procedures Procedures (including critical care time)  Medications Ordered in UC Medications - No data to display  Initial Impression / Assessment and Plan / UC Course  I have reviewed the triage vital signs and the nursing notes.  Pertinent labs & imaging results that were available during my care of the patient were reviewed by me and considered in my medical decision making (see chart for details).    Treating for acute viral bronchitis.  Patient recently treated with a 10-day course of antibiotics therefore will not repeat a course of antibiotics as I suspect symptoms are viral in etiology as they developed while patient was taking a antibiotic.  He is afebrile has a history of asthma therefore will trial a brief course of prednisone along with promethazine for cough.  Strict ER precautions given if symptoms worsen or do not readily improve Final Clinical Impressions(s) / UC Diagnoses   Final diagnoses:  Acute bronchitis, unspecified organism     Discharge Instructions      If any of your symptoms worsen or become severe go immediately to the nearest emergency department.  Complete all medication as prescribed.       ED Prescriptions     Medication Sig Dispense Auth. Provider   predniSONE (DELTASONE) 20 MG tablet Take 1 tablet (20 mg total) by mouth daily with breakfast for 5 days. 5 tablet Scot Jun, FNP   promethazine-dextromethorphan (PROMETHAZINE-DM) 6.25-15 MG/5ML syrup Take 5 mLs by mouth 3 (three) times daily as needed for cough. [place]  mL Scot Jun, FNP      PDMP not reviewed this encounter.   Scot Jun, [place] 04/15/21 [phone]

## 2021-04-15 NOTE — ED Triage Notes (Signed)
Cough, sneezing, watery eyes x 1 week.  Hx of inner ear infection a week ago.

## 2021-04-16 DIAGNOSIS — H5789 Other specified disorders of eye and adnexa: Secondary | ICD-10-CM | POA: Diagnosis not present

## 2021-04-23 ENCOUNTER — Encounter (HOSPITAL_COMMUNITY)
Admission: RE | Admit: 2021-04-23 | Discharge: 2021-04-23 | Disposition: A | Discharge status: Home / Self Care | Payer: No Typology Code available for payment source | Source: Ambulatory Visit | Attending: Physician Assistant | Admitting: Physician Assistant

## 2021-04-23 ENCOUNTER — Other Ambulatory Visit: Payer: Self-pay

## 2021-04-23 DIAGNOSIS — R918 Other nonspecific abnormal finding of lung field: Secondary | ICD-10-CM | POA: Insufficient documentation

## 2021-04-23 LAB — GLUCOSE, CAPILLARY: Glucose-Capillary: 123 mg/dL — ABNORMAL HIGH (ref 70–99)

## 2021-04-23 MED ORDER — FLUDEOXYGLUCOSE F - 18 (FDG) INJECTION
13.6000 | Freq: Once | INTRAVENOUS | Status: AC
  Administered 2021-04-23: 13.06 via INTRAVENOUS

## 2021-04-25 DIAGNOSIS — J069 Acute upper respiratory infection, unspecified: Secondary | ICD-10-CM | POA: Diagnosis not present

## 2021-04-25 DIAGNOSIS — N390 Urinary tract infection, site not specified: Secondary | ICD-10-CM | POA: Diagnosis not present

## 2021-04-25 DIAGNOSIS — R350 Frequency of micturition: Secondary | ICD-10-CM | POA: Diagnosis not present

## 2021-04-25 DIAGNOSIS — R070 Pain in throat: Secondary | ICD-10-CM | POA: Diagnosis not present

## 2021-04-30 ENCOUNTER — Ambulatory Visit (INDEPENDENT_AMBULATORY_CARE_PROVIDER_SITE_OTHER): Payer: No Typology Code available for payment source | Admitting: Adult Health

## 2021-04-30 ENCOUNTER — Encounter: Payer: Self-pay | Admitting: Adult Health

## 2021-04-30 ENCOUNTER — Other Ambulatory Visit: Payer: Self-pay

## 2021-04-30 VITALS — BP 130/60 | HR 74 | Temp 98.1°F | Ht 72.0 in | Wt 273.4 lb

## 2021-04-30 DIAGNOSIS — R918 Other nonspecific abnormal finding of lung field: Secondary | ICD-10-CM | POA: Diagnosis not present

## 2021-04-30 DIAGNOSIS — R9389 Abnormal findings on diagnostic imaging of other specified body structures: Secondary | ICD-10-CM

## 2021-04-30 DIAGNOSIS — R3914 Feeling of incomplete bladder emptying: Secondary | ICD-10-CM | POA: Diagnosis not present

## 2021-04-30 DIAGNOSIS — N401 Enlarged prostate with lower urinary tract symptoms: Secondary | ICD-10-CM

## 2021-04-30 LAB — CBC WITH DIFFERENTIAL/PLATELET
Basophils Absolute: 0 10*3/uL (ref 0.0–0.1)
Basophils Relative: 0.4 % (ref 0.0–3.0)
Eosinophils Absolute: 0.2 10*3/uL (ref 0.0–0.7)
Eosinophils Relative: 1.8 % (ref 0.0–5.0)
HCT: 41.1 % (ref 39.0–52.0)
Hemoglobin: 13.5 g/dL (ref 13.0–17.0)
Lymphocytes Relative: 23 % (ref 12.0–46.0)
Lymphs Abs: 2.2 10*3/uL (ref 0.7–4.0)
MCHC: 32.9 g/dL (ref 30.0–36.0)
MCV: 87.8 fl (ref 78.0–100.0)
Monocytes Absolute: 0.9 10*3/uL (ref 0.1–1.0)
Monocytes Relative: 9.2 % (ref 3.0–12.0)
Neutro Abs: 6.2 10*3/uL (ref 1.4–7.7)
Neutrophils Relative %: 65.6 % (ref 43.0–77.0)
Platelets: 227 10*3/uL (ref 150.0–400.0)
RBC: 4.68 Mil/uL (ref 4.22–5.81)
RDW: 16.3 % — ABNORMAL HIGH (ref 11.5–15.5)
WBC: 9.4 10*3/uL (ref 4.0–10.5)

## 2021-04-30 LAB — PSA: PSA: 2.22 ng/mL (ref 0.10–4.00)

## 2021-04-30 LAB — SEDIMENTATION RATE: Sed Rate: 11 mm/hr (ref 0–20)

## 2021-04-30 NOTE — Assessment & Plan Note (Addendum)
Waxing and waning lung nodules questionable etiology.  PET scan does show hypermetabolic activity however with some decreased size in lung nodules.  Also some new areas of concern.  Questionable etiology.  We will check labs today with CBC with differential, ACE level, sed rate. Patient is essentially asymptomatic. Will need to repeat CT chest in 3 months.  We will discuss case with Dr. Lamonte Sakai and decide if he should proceed with bronchoscopy with BAL /cultures.  Patient has a strong smoking history.  Check PFTs on return  Plan  Patient Instructions  Activity as tolerated.  Refer to Urology for Prostate.  Labs today.  CT chest without contrast in 3 months.  Follow up with Dr. Lamonte Sakai  in 6 weeks with PFT and As needed

## 2021-04-30 NOTE — Progress Notes (Signed)
@Patient  ID: Gary Carroll, male    DOB: [birth year], 75 y.o.   MRN: [id number]  Chief Complaint  Patient presents with   Follow-up    Referring provider: Celene Squibb, MD  HPI: 75 year old male with a former heavy smoking history followed for abnormal CT chest with lung nodularity Followed at the [facility] Medical history significant for obstructive sleep apnea on CPAP, A. fib, aortic valve stenosis, GERD  TEST/EVENTS :  LDCT 09/19/2020, report:   Bronchovascular nodular consolidation in left upper lobe have decreased in size compared with January 2022.  There was note made of a few new small focal nodular areas in the left upper lobe up to 5 mm in size.     CT chest 12/20/20 from the [place], reviewed by me, some stable scattered old granulomatous disease with some calcification, some waxing and waning nodular consolidation in the left upper lobe, slightly increased in size but with some shifting distribution that is suggestive of possible atypical mycobacterial infection  CT chest 02/02/2021 (while hospitalized) reviewed by me shows prominent mediastinal lymphadenopathy, enlarged right hilar node, left hilar node, paraesophageal nodes.  There is some left apical nodular opacity present   CT chest 03/22/2021 reviewed by me shows persistent prominent mediastinal nodes, slightly smaller than 2 months prior.  Persistent left upper lobe nodular process.  Adjacent nodules 14 mm, 12 mm both larger.  There is a left apical bilobed nodule 2.1 cm  04/30/2021 Follow up : Lung nodules  Patient returns for a 1 month follow-up.  Patient is being followed for waxing and waning lung nodules.  Most recent CT chest March 22, 2021 showed persistent prominent mediastinal nodes.  Persistent left upper lobe nodular process.  And adjacent nodes measuring 14 mm and 12 mm that had increased in size.  And a left apical nodule measuring 2.1 cm.  Patient was set up for a PET scan -that was completed on April 23, 2021.  This showed left upper lobe pulmonary nodule decreased in size -measuring 1.7 x 1.0 cm previously at 2.1 x 1.6 cm.  Nodule is hypermetabolic with SUV max at 8.6.  Previous left apical nodule decreased in size at 1.0 cm.  Recently at 1.4 cm, new area of left upper lobe nodularity measuring 6 mm that is hypermetabolic with SUV max at 4.5, hypermetabolic activity in the right minor fissure. Right lateral prostatic gland hypermetabolic with SUV max at 6.2.  Low-level hypermetabolism in the prominent bilateral inguinal nodes. We discussed his test results.  Patient says he has no significant symptoms.  Denies any cough.  No significant shortness of breath.  Patient says he is very active goes to the [employer] daily.  Swims on a consistent basis.  O2 saturations are 97% on room air.  Patient denies any hemoptysis, weight loss, orthopnea or increased edema.  Patient does have chronic leg swelling to wear support stockings.  Allergies  Allergen Reactions   Diovan [Valsartan] Cough   Tagamet [Cimetidine] Other (See Comments)    irrittability    Immunization History  Administered Date(s) Administered   Fluad Quad(high Dose 65+) 01/25/2019, 01/22/2021   Influenza Split 02/11/2011, 02/01/2012   Influenza, High Dose Seasonal PF 01/27/2015, 01/19/2016, 01/27/2017   Influenza-Unspecified 01/18/2018, 02/06/2018, 03/21/2019, 01/19/2020   Moderna SARS-COV2 Booster Vaccination 01/22/2021   Moderna Sars-Covid-2 Vaccination 06/12/2019, 07/12/2019, 03/13/2020   Pneumococcal Conjugate-13 01/27/2015   Pneumococcal Polysaccharide-23 02/06/2012, 03/25/2017   Tdap 08/05/2011   Tetanus 05/20/2009   Zoster Recombinat (Shingrix) 09/13/2019, 12/14/2019  Zoster, Live 05/23/2012    Past Medical History:  Diagnosis Date   Allergic rhinitis    Arthritis    Asthma    seasonal per pt   Bilateral knee pain    OA   Diverticulosis 02/2012   seen on colonoscopy   Erectile dysfunction    GERD (gastroesophageal reflux  disease)    Heart murmur    Hiatal hernia    HLD (hyperlipidemia)    HTN (hypertension)    Hydronephrosis determined by ultrasound 08/22/2015   Overactive bladder    Prediabetes    Ringing of ears    Sleep apnea    Urinary frequency    Urinary urgency    Wears glasses     Tobacco History: Social History   Tobacco Use  Smoking Status Former   Packs/day: 1.00   Types: Cigarettes   Quit date: 01/18/2010   Years since quitting: 11.2  Smokeless Tobacco Never  Tobacco Comments   Smoked for 25 years- quit September 2011.  Passive exposure from wife   Counseling given: Not Answered Tobacco comments: Smoked for 25 years- quit September 2011.  Passive exposure from wife   Outpatient Medications Prior to Visit  Medication Sig Dispense Refill   apixaban (ELIQUIS) 5 MG TABS tablet Take 1 tablet (5 mg total) by mouth 2 (two) times daily. 60 tablet 2   Calcium Carb-Cholecalciferol (CALCIUM 500 +D PO) Take by mouth.     diphenhydrAMINE (BENADRYL) 25 MG tablet Take 50 mg by mouth at bedtime as needed for sleep.     furosemide (LASIX) 40 MG tablet Take 40 mg by mouth daily.     loratadine (CLARITIN) 10 MG tablet Take 10 mg by mouth daily as needed.     losartan (COZAAR) 50 MG tablet Take 50 mg by mouth daily.     MAGNESIUM PO Take by mouth.     Melatonin 5 MG TABS Take 10 mg by mouth at bedtime.      metoprolol tartrate (LOPRESSOR) 25 MG tablet Take 1 tablet (25 mg total) by mouth 2 (two) times daily. 60 tablet 2   Multiple Vitamins-Minerals (MULTIVITAL) tablet Take 1 tablet by mouth daily.     Omega-3 Fatty Acids (FISH OIL) 1000 MG CAPS Take 4 capsules by mouth daily.      oxybutynin (DITROPAN) 5 MG tablet Take 5 mg by mouth daily.     potassium chloride (K-DUR) 10 MEQ tablet Take 1 tablet (10 mEq total) by mouth daily. 30 tablet 0   promethazine-dextromethorphan (PROMETHAZINE-DM) 6.25-15 MG/5ML syrup Take 5 mLs by mouth 3 (three) times daily as needed for cough. 140 mL 0   rosuvastatin  (CRESTOR) 20 MG tablet Take 20 mg by mouth daily.     sulfamethoxazole-trimethoprim (BACTRIM DS) 800-160 MG tablet sulfamethoxazole 800 mg-trimethoprim 160 mg tablet  Take 1 tablet every 12 hours by oral route for 10 days.     terbinafine (LAMISIL) 1 % cream APPLY MODERATE AMOUNT TO AFFECTED AREA TWICE A DAY     vitamin C (ASCORBIC ACID) 500 MG tablet Take 500 mg by mouth daily.     No facility-administered medications prior to visit.     Review of Systems:   Constitutional:   No  weight loss, night sweats,  Fevers, chills,  +fatigue, or  lassitude.  HEENT:   No headaches,  Difficulty swallowing,  Tooth/dental problems, or  Sore throat,                No sneezing, itching,  ear ache, nasal congestion, post nasal drip,   CV:  No chest pain,  Orthopnea, PND, , anasarca, dizziness, palpitations, syncope.   GI  No heartburn, indigestion, abdominal pain, nausea, vomiting, diarrhea, change in bowel habits, loss of appetite, bloody stools.   Resp:  No chest wall deformity  Skin: no rash or lesions.  GU: no dysuria, change in color of urine, no urgency or frequency.  No flank pain, no hematuria   MS:  No joint pain or swelling.  No decreased range of motion.  No back pain.    Physical Exam  BP 130/60 (BP Location: Left Arm, Patient Position: Sitting, Cuff Size: Normal)   Pulse 74   Temp 98.1 F (36.7 C) (Oral)   Ht 6' (1.829 m)   Wt 273 lb 6.4 oz (124 kg)   SpO2 97%   BMI 37.08 kg/m   GEN: A/Ox3; pleasant , NAD, well nourished    HEENT:  [place]/AT,   NOSE-clear, THROAT-clear, no lesions, no postnasal drip or exudate noted.   NECK:  Supple w/ fair ROM; no JVD; normal carotid impulses w/o bruits; no thyromegaly or nodules palpated; no lymphadenopathy.    RESP  Clear  P & A; w/o, wheezes/ rales/ or rhonchi. no accessory muscle use, no dullness to percussion  CARD:  RRR, no m/r/g, 1+  peripheral edema, pulses intact, no cyanosis or clubbing. TEDS hose.   GI:   Soft & nt; nml  bowel sounds; no organomegaly or masses detected.   Musco: Warm bil, no deformities or joint swelling noted.   Neuro: alert, no focal deficits noted.    Skin: Warm, no lesions or rashes    Lab Results:  CBC    Component Value Date/Time   WBC 8.5 02/09/2021 0248   RBC 4.51 02/09/2021 0248   HGB 12.9 (L) 02/09/2021 0248   HCT 40.9 02/09/2021 0248   PLT 219 02/09/2021 0248   MCV 90.7 02/09/2021 0248   MCH 28.6 02/09/2021 0248   MCHC 31.5 02/09/2021 0248   RDW 15.4 02/09/2021 0248   LYMPHSABS 1.7 10/09/2017 0506   MONOABS 0.8 10/09/2017 0506   EOSABS 0.1 10/09/2017 0506   BASOSABS 0.0 10/09/2017 0506    BMET    Component Value Date/Time   NA 137 02/15/2021 0831   K 4.1 02/15/2021 0831   CL 107 02/15/2021 0831   CO2 22 02/15/2021 0831   GLUCOSE 175 (H) 02/15/2021 0831   BUN 22 02/15/2021 0831   CREATININE 0.92 02/15/2021 0831   CREATININE 0.85 02/06/2012 0929   CALCIUM 9.1 02/15/2021 0831   GFRNONAA >60 02/15/2021 0831   GFRAA >60 10/09/2017 0506    BNP    Component Value Date/Time   BNP 837.1 (H) 02/03/2021 0608    ProBNP No results found for: PROBNP  Imaging: NM PET Image Initial (PI) Skull Base To Thigh (F-18 FDG)  Result Date: 04/24/2021 CLINICAL DATA:  Initial treatment strategy for left apical nodularity. EXAM: NUCLEAR MEDICINE PET SKULL BASE TO THIGH TECHNIQUE: 13.1 mCi F-18 FDG was injected intravenously. Full-ring PET imaging was performed from the skull base to thigh after the radiotracer. CT data was obtained and used for attenuation correction and anatomic localization. Fasting blood glucose: 123 mg/dl COMPARISON:  Chest CTs, most recent 03/22/2021. Abdominal CT 02/02/2021 scratch the abdominopelvic CT 02/02/2021. FINDINGS: Mediastinal blood pool activity: SUV max 2.5 Liver activity: SUV max NA NECK: No areas of abnormal hypermetabolism. Incidental CT findings: Right maxillary sinus mucous retention cysts or polyps. Fluid in  the dependent sphenoid and  frontal sinuses with mucosal thickening in the ethmoid air cells. No cervical adenopathy. CHEST: The left upper lobe pulmonary nodularity is hypermetabolic. Example bilobed nodule of 1.7 x 1.0 cm and a S.U.V. max of 8.6 on 17/8. Compare 2.1 x 1.6 cm on the prior exam. more cephalad left apical nodules including at up to 1.0 cm on 14/8. Compare 1.4 cm on the prior exam (when remeasured). Caudal most area of left upper lobe nodularity measures 6 mm and a S.U.V. max of 4.5 on 19/8, felt to be new since 03/22/2021. Hypermetabolism on the right minor fissure may correspond to a new area of nodularity. Example at 1.5 cm and a S.U.V. max of 4.6 on 34/8. Not readily apparent on the prior diagnostic CT. Node within the azygoesophageal recess measures 11 mm and a S.U.V. max of 3.1 on 79/4 versus 9 mm on the prior diagnostic CT. Incidental CT findings: Aortic and coronary artery calcification. Mild cardiomegaly. Left apical septal thickening remains. ABDOMEN/PELVIS: Right lateral mid gland prostatic hypermetabolism at a S.U.V. max of 6.2 on approximally 189/4. A portacaval node measures 9 mm and a S.U.V. max of 2.7 on 116/4. Low-level hypermetabolism corresponding to prominent bilateral inguinal nodes. Example left inguinal node 1.4 cm and a S.U.V. max of 2.3 on 209/4. A right inguinal node measures 1.5 cm and a S.U.V. max of 3.7 on 192/4. Incidental CT findings: Aortic atherosclerosis. Cholecystectomy. Minimal left adrenal thickening and nodularity. Moderate prostatomegaly. SKELETON: No abnormal marrow activity. Incidental CT findings: none IMPRESSION: 1. Hypermetabolism corresponding to the left upper lobe nodularity described on the prior chest CT. Some of this nodularity is felt to be decreased, while 1 small nodule is new. Morphology as well as suggestion of waxing and waning size favors infectious or inflammatory etiology. Consider CT surveillance at 6-12 weeks. 2. Subtle hypermetabolism along the right minor fissure,  possibly concurrent with a developing pulmonary nodule since 03/22/2021. This also favors an infectious or inflammatory etiology. Recommend attention on follow-up. 3. Low-level hypermetabolism within nodes within the chest, abdomen, and pelvis. These could be reactive or represent an indolent lymphoproliferative process. Consider CT follow-up at 6 months. 4. Prostatic hypermetabolism is suspicious for carcinoma. Correlate with PSA level, physical exam, and possibly dedicated prostate MRI. 5. Incidental findings, including: Coronary artery atherosclerosis. Aortic Atherosclerosis (ICD10-I70.0). Sinus disease. Electronically Signed   By: Abigail Miyamoto M.D.   On: 04/24/2021 15:31      No flowsheet data found.  No results found for: NITRICOXIDE      Assessment & Plan:   No problem-specific Assessment & Plan notes found for this encounter.     Rexene Edison, NP 04/30/2021

## 2021-04-30 NOTE — Assessment & Plan Note (Signed)
Abnormal CT chest and PET scan show enlarged prostate with hypermetabolic activity.  Patient needs to follow-up with his urologist.  PSA will be added to today's labs.  Patient is to make an appointment with his urologist that he follows with on a regular basis in the next 1 to 2 weeks.

## 2021-04-30 NOTE — Patient Instructions (Addendum)
Activity as tolerated.  Refer to Urology for Prostate.  Labs today.  CT chest without contrast in 3 months.  Follow up with Dr. Lamonte Sakai  in 6 weeks with PFT and As needed

## 2021-05-02 LAB — ANGIOTENSIN CONVERTING ENZYME: Angiotensin-Converting Enzyme: 55 U/L (ref 9–67)

## 2021-05-04 ENCOUNTER — Other Ambulatory Visit: Payer: Self-pay | Admitting: *Deleted

## 2021-05-04 DIAGNOSIS — E1169 Type 2 diabetes mellitus with other specified complication: Secondary | ICD-10-CM | POA: Diagnosis not present

## 2021-05-04 DIAGNOSIS — I10 Essential (primary) hypertension: Secondary | ICD-10-CM | POA: Diagnosis not present

## 2021-05-04 DIAGNOSIS — R918 Other nonspecific abnormal finding of lung field: Secondary | ICD-10-CM

## 2021-05-04 DIAGNOSIS — R9389 Abnormal findings on diagnostic imaging of other specified body structures: Secondary | ICD-10-CM

## 2021-05-07 ENCOUNTER — Other Ambulatory Visit: Payer: No Typology Code available for payment source

## 2021-05-07 DIAGNOSIS — R9389 Abnormal findings on diagnostic imaging of other specified body structures: Secondary | ICD-10-CM

## 2021-05-09 ENCOUNTER — Telehealth: Payer: Self-pay | Admitting: Adult Health

## 2021-05-09 LAB — ANTI-NUCLEAR AB-TITER (ANA TITER): ANA Titer 1: 1:40 {titer} — ABNORMAL HIGH

## 2021-05-09 LAB — ANTI-SCLERODERMA ANTIBODY: Scleroderma (Scl-70) (ENA) Antibody, IgG: <1 AI

## 2021-05-09 LAB — SJOGREN'S SYNDROME ANTIBODS(SSA + SSB)
SSA (Ro) (ENA) Antibody, IgG: <1 AI
SSB (La) (ENA) Antibody, IgG: <1 AI

## 2021-05-09 LAB — ANA: Anti Nuclear Antibody (ANA): POSITIVE — AB

## 2021-05-09 LAB — ANCA SCREEN W REFLEX TITER

## 2021-05-09 NOTE — Telephone Encounter (Signed)
Noted. Will route to Gary Carroll as FYI ONLY. Nothing further needed at this time.    [name] from [place] calling to let us know the request for urology referral was received and is being handled in house at the [place]. Please advise

## 2021-05-15 DIAGNOSIS — R339 Retention of urine, unspecified: Secondary | ICD-10-CM | POA: Diagnosis not present

## 2021-05-15 DIAGNOSIS — I2510 Atherosclerotic heart disease of native coronary artery without angina pectoris: Secondary | ICD-10-CM | POA: Diagnosis not present

## 2021-05-15 DIAGNOSIS — I10 Essential (primary) hypertension: Secondary | ICD-10-CM | POA: Diagnosis not present

## 2021-05-15 DIAGNOSIS — G4730 Sleep apnea, unspecified: Secondary | ICD-10-CM | POA: Diagnosis not present

## 2021-05-15 DIAGNOSIS — I5042 Chronic combined systolic (congestive) and diastolic (congestive) heart failure: Secondary | ICD-10-CM | POA: Diagnosis not present

## 2021-05-15 DIAGNOSIS — N401 Enlarged prostate with lower urinary tract symptoms: Secondary | ICD-10-CM | POA: Diagnosis not present

## 2021-05-15 DIAGNOSIS — E1169 Type 2 diabetes mellitus with other specified complication: Secondary | ICD-10-CM | POA: Diagnosis not present

## 2021-05-15 DIAGNOSIS — E782 Mixed hyperlipidemia: Secondary | ICD-10-CM | POA: Diagnosis not present

## 2021-05-15 DIAGNOSIS — I480 Paroxysmal atrial fibrillation: Secondary | ICD-10-CM | POA: Diagnosis not present

## 2021-05-16 ENCOUNTER — Telehealth: Payer: Self-pay | Admitting: Emergency Medicine

## 2021-05-16 NOTE — Progress Notes (Signed)
Patient returned call.  Provided results/recommendations per Tammy Parrett.  He verbalized understanding.  Nothing further needed.

## 2021-05-16 NOTE — Progress Notes (Signed)
ATC x1.  LVM to return call. 

## 2021-05-17 NOTE — Telephone Encounter (Signed)
Lm for patient.  

## 2021-05-17 NOTE — Telephone Encounter (Signed)
Spoke with patient on 12/28 regarding results.  Nothing further needed.

## 2021-05-18 DIAGNOSIS — I10 Essential (primary) hypertension: Secondary | ICD-10-CM | POA: Diagnosis not present

## 2021-05-18 DIAGNOSIS — E782 Mixed hyperlipidemia: Secondary | ICD-10-CM | POA: Diagnosis not present

## 2021-06-17 ENCOUNTER — Ambulatory Visit
Admission: EM | Admit: 2021-06-17 | Discharge: 2021-06-17 | Disposition: A | Discharge status: Home / Self Care | Payer: Medicare HMO

## 2021-06-17 ENCOUNTER — Other Ambulatory Visit: Payer: Self-pay

## 2021-06-17 ENCOUNTER — Encounter: Payer: Self-pay | Admitting: *Deleted

## 2021-06-17 DIAGNOSIS — H66002 Acute suppurative otitis media without spontaneous rupture of ear drum, left ear: Secondary | ICD-10-CM

## 2021-06-17 DIAGNOSIS — Z7901 Long term (current) use of anticoagulants: Secondary | ICD-10-CM

## 2021-06-17 HISTORY — DX: Other disorders of lung: J98.4

## 2021-06-17 MED ORDER — AMOXICILLIN 875 MG PO TABS: 875.0000 mg | ORAL_TABLET | Freq: Two times a day (BID) | ORAL | 0 refills | Status: AC

## 2021-06-17 NOTE — Discharge Instructions (Addendum)
-  Amoxicillin twice daily x7 days

## 2021-06-17 NOTE — ED Triage Notes (Signed)
C/O left earache and slight cough onset 2 days ago. Denies fever.

## 2021-06-17 NOTE — ED Provider Notes (Signed)
[facility]    CSN: [id number] Arrival date & time: 06/17/21  1011      History   Chief Complaint Chief Complaint  Patient presents with   Cough   Otalgia    HPI Gary Carroll is a 76 y.o. male presenting with cough and ear pain.  Medical history asthma that is typically controlled on no inhalers.  States new onset of left ear pain. Cough is nonproductive, denies increase in baseline SOB. Denies CP, fevers/chills. L ear pain is sharp and throbbing without hearing changes, dizziness, tinnitus. States he is prone to ear infections.  HPI  Past Medical History:  Diagnosis Date   Allergic rhinitis    Arthritis    Asthma    seasonal per pt   Bilateral knee pain    OA   Diverticulosis 02/2012   seen on colonoscopy   Erectile dysfunction    GERD (gastroesophageal reflux disease)    Heart murmur    Hiatal hernia    HLD (hyperlipidemia)    HTN (hypertension)    Hydronephrosis determined by ultrasound 08/22/2015   Overactive bladder    Prediabetes    Pulmonary lesion    Ringing of ears    Sleep apnea    Urinary frequency    Urinary urgency    Wears glasses     Patient Active Problem List   Diagnosis Date Noted   Lung nodules 04/30/2021   Chronic anticoagulation 03/14/2021   Aortic stenosis 03/14/2021   Pressure injury of skin 02/03/2021   Septic shock (HCC)    Acute respiratory failure with hypoxia and hypercapnia (HCC)    Acute pulmonary edema (HCC)    Abnormal CT of the chest 10/23/2020   History of tobacco use 10/23/2020   Chronic knee pain 03/19/2018   Chronic postoperative pain 03/19/2018   Elevated transaminase level    Sleep apnea 10/01/2017   BPH (benign prostatic hyperplasia) 02/04/2017   Urinary tract infection 02/04/2017   Cellulitis of left lower extremity 02/03/2017   GERD (gastroesophageal reflux disease) 02/03/2017   AF (paroxysmal atrial fibrillation) ([place]) 02/03/2017   Sepsis due to cellulitis ([place]) 02/03/2017   New onset  atrial fibrillation ([place]) 08/24/2015   Diuresis excessive 08/22/2015   Bladder outlet obstruction 08/22/2015   Hydronephrosis determined by ultrasound 08/22/2015   AKI (acute kidney injury) ([place]) 08/22/2015   Hypernatremia 08/22/2015   Acute blood loss anemia 08/22/2015   Hematuria 08/22/2015   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 Lorre Nick)   TOTAL KNEE ARTHROPLASTY Right 06/19/2015   Procedure: TOTAL KNEE ARTHROPLASTY;  Surgeon: Vickey Huger, MD;  Location: [place];  Service: Orthopedics;  Laterality: Right;       Home Medications    Prior to Admission medications   Medication Sig Start Date  End Date Taking? Authorizing Provider  amoxicillin (AMOXIL) 875 MG tablet Take 1 tablet (875 mg total) by mouth 2 (two) times daily for 7 days. 06/17/21 06/24/21 Yes Hazel Sams, PA-C  apixaban (ELIQUIS) 5 MG TABS tablet Take 1 tablet (5 mg total) by mouth 2 (two) times daily. 08/24/15  Yes Isaac Bliss, Rayford Halsted, MD  Calcium Carb-Cholecalciferol (CALCIUM 500 +D PO) Take by mouth.   Yes [provider]  furosemide (LASIX) 40 MG tablet Take 40 mg by mouth daily. 07/27/19  Yes [provider]  loratadine (CLARITIN) 10 MG tablet Take 10 mg by mouth daily as needed.   Yes [provider]  losartan (COZAAR) 50 MG tablet Take 50 mg by mouth daily.   Yes [provider]  MAGNESIUM PO Take by mouth.   Yes [provider]  Melatonin 5 MG TABS Take 10 mg by mouth at bedtime.    Yes [provider]  metoprolol tartrate (LOPRESSOR) 25 MG tablet Take 1 tablet (25 mg total) by mouth 2 (two) times daily. 08/24/15  Yes Isaac Bliss, Rayford Halsted, MD  Multiple Vitamins-Minerals (MULTIVITAL) tablet Take 1 tablet by mouth daily.   Yes [provider]  Omega-3 Fatty Acids (FISH OIL) 1000 MG CAPS Take 4 capsules by mouth daily.    Yes [provider]  oxybutynin (DITROPAN) 5 MG tablet Take 5 mg by mouth daily. 06/01/19  Yes [provider]  potassium chloride (K-DUR) 10 MEQ tablet Take 1 tablet (10 mEq total) by mouth daily. 10/09/17  Yes Kathie Dike, MD  rosuvastatin (CRESTOR) 20 MG tablet Take 20 mg by mouth daily.   Yes [provider]  UNKNOWN TO PATIENT "2 new ones: one for indigestion and one to shrink my prostate"   Yes [provider]  vitamin C (ASCORBIC ACID) 500 MG tablet Take 500 mg by mouth daily.   Yes [provider]  diphenhydrAMINE (BENADRYL) 25 MG tablet Take 50 mg by mouth at bedtime as needed for sleep.    [provider]  promethazine-dextromethorphan (PROMETHAZINE-DM) 6.25-15 MG/5ML syrup Take 5 mLs by mouth 3 (three) times daily as needed for cough. 04/15/21   Scot Jun, FNP  sulfamethoxazole-trimethoprim (BACTRIM DS) 800-160 MG tablet sulfamethoxazole 800 mg-trimethoprim 160 mg tablet  Take 1 tablet every 12 hours by oral route for 10 days.    [provider]  terbinafine (LAMISIL) 1 % cream APPLY MODERATE AMOUNT TO AFFECTED AREA TWICE A DAY 12/13/20   [provider]    Family History Family History  Problem Relation Age of Onset   Uterine cancer Mother    Cancer Mother        uterine   Coronary artery disease Father    Coronary artery disease  Paternal Grandfather    Heart disease Paternal Grandfather    Colon cancer Neg Hx    Esophageal cancer Neg Hx    Stomach cancer Neg Hx    Rectal cancer Neg Hx    Liver disease Neg Hx     Social History Social History   Tobacco Use   Smoking status: Former    Packs/day: 1.00    Types: Cigarettes    Quit date: 01/18/2010    Years since quitting: 11.4   Smokeless tobacco: Never   Tobacco comments:    Smoked for 25 years- quit September 2011.  Passive exposure from wife  Vaping Use   Vaping Use: Never used  Substance Use Topics   Alcohol  use: Not Currently    Alcohol/week: 1.0 - 2.0 standard drink    Types: 1 - 2 Glasses of wine per week   Drug use: No     Allergies   Diovan [valsartan] and Tagamet [cimetidine]   Review of Systems Review of Systems  Constitutional:  Negative for appetite change, chills and fever.  HENT:  Positive for congestion and ear pain. Negative for rhinorrhea, sinus pressure, sinus pain and sore throat.   Eyes:  Negative for redness and visual disturbance.  Respiratory:  Positive for cough. Negative for chest tightness, shortness of breath and wheezing.   Cardiovascular:  Negative for chest pain and palpitations.  Gastrointestinal:  Negative for abdominal pain, constipation, diarrhea, nausea and vomiting.  Genitourinary:  Negative for dysuria, frequency and urgency.  Musculoskeletal:  Negative for myalgias.  Neurological:  Negative for dizziness, weakness and headaches.  Psychiatric/Behavioral:  Negative for confusion.   All other systems reviewed and are negative.   Physical Exam Triage Vital Signs ED Triage Vitals  Enc Vitals Group     BP      Pulse      Resp      Temp      Temp src      SpO2      Weight      Height      Head Circumference      Peak Flow      Pain Score      Pain Loc      Pain Edu?      Excl. in [place]?    No data found.  Updated Vital Signs BP (!) 173/73    Pulse 65    Temp 97.7 F (36.5 C) (Oral)    Resp 18     SpO2 97%   Visual Acuity Right Eye Distance:   Left Eye Distance:   Bilateral Distance:    Right Eye Near:   Left Eye Near:    Bilateral Near:     Physical Exam Vitals reviewed.  Constitutional:      Appearance: Normal appearance. He is not ill-appearing.  HENT:     Head: Normocephalic and atraumatic.     Right Ear: Hearing, tympanic membrane, ear canal and external ear normal. No swelling or tenderness. No middle ear effusion. There is no impacted cerumen. No mastoid tenderness. Tympanic membrane is not injected, scarred, perforated, erythematous, retracted or bulging.     Left Ear: Hearing, ear canal and external ear normal. Tenderness present. No swelling.  No middle ear effusion. There is no impacted cerumen. No mastoid tenderness. Tympanic membrane is erythematous. Tympanic membrane is not injected, scarred, perforated, retracted or bulging.     Mouth/Throat:     Pharynx: Oropharynx is clear. No oropharyngeal exudate or posterior oropharyngeal erythema.  Cardiovascular:     Rate and Rhythm: Normal rate and regular rhythm.     Heart sounds: Normal heart sounds.  Pulmonary:     Effort: Pulmonary effort is normal.     Breath sounds: Normal breath sounds.  Lymphadenopathy:     Cervical: No cervical adenopathy.  Neurological:     General: No focal deficit present.     Mental Status: He is alert and oriented to person, place, and time.  Psychiatric:        Mood and Affect: Mood normal.        Behavior: Behavior normal.        Thought Content: Thought content normal.  Judgment: Judgment normal.     UC Treatments / Results  Labs (all labs ordered are listed, but only abnormal results are displayed) Labs Reviewed - No data to display  EKG   Radiology No results found.  Procedures Procedures (including critical care time)  Medications Ordered in UC Medications - No data to display  Initial Impression / Assessment and Plan / UC Course  I have reviewed the  triage vital signs and the nursing notes.  Pertinent labs & imaging results that were available during my care of the patient were reviewed by me and considered in my medical decision making (see chart for details).     This patient is a very pleasant 76 y.o. year old male presenting with L AOM following viral syndrome. Afebrile, nontachy.   Mild viral symptoms x5 days, did not check a covid or influenza test.   Amoxicillin sent.   ED return precautions discussed. Patient verbalizes understanding and agreement.     Final Clinical Impressions(s) / UC Diagnoses   Final diagnoses:  Long term current use of anticoagulant therapy  Non-recurrent acute suppurative otitis media of left ear without spontaneous rupture of tympanic membrane     Discharge Instructions      -Amoxicillin twice daily x7 days     ED Prescriptions     Medication Sig Dispense Auth. Provider   amoxicillin (AMOXIL) 875 MG tablet Take 1 tablet (875 mg total) by mouth 2 (two) times daily for 7 days. 14 tablet Hazel Sams, PA-C      PDMP not reviewed this encounter.   Hazel Sams, PA-C 06/17/21 1227

## 2021-06-26 ENCOUNTER — Ambulatory Visit (INDEPENDENT_AMBULATORY_CARE_PROVIDER_SITE_OTHER): Payer: No Typology Code available for payment source | Admitting: Emergency Medicine

## 2021-06-26 ENCOUNTER — Encounter: Payer: Self-pay | Admitting: Emergency Medicine

## 2021-06-26 ENCOUNTER — Other Ambulatory Visit: Payer: Self-pay

## 2021-06-26 DIAGNOSIS — R918 Other nonspecific abnormal finding of lung field: Secondary | ICD-10-CM | POA: Diagnosis not present

## 2021-06-26 DIAGNOSIS — R9389 Abnormal findings on diagnostic imaging of other specified body structures: Secondary | ICD-10-CM

## 2021-06-26 DIAGNOSIS — G4730 Sleep apnea, unspecified: Secondary | ICD-10-CM | POA: Diagnosis not present

## 2021-06-26 DIAGNOSIS — J984 Other disorders of lung: Secondary | ICD-10-CM | POA: Diagnosis not present

## 2021-06-26 LAB — PULMONARY FUNCTION TEST
DL/VA % pred: 147 %
DL/VA: 5.77 ml/min/mmHg/L
DLCO cor % pred: 93 %
DLCO cor: 24.72 ml/min/mmHg
DLCO unc % pred: 93 %
DLCO unc: 24.72 ml/min/mmHg
FEF 25-75 Post: 2.07 L/sec
FEF 25-75 Pre: 2.68 L/sec
FEF2575-%Change-Post: -22 %
FEF2575-%Pred-Post: 87 %
FEF2575-%Pred-Pre: 113 %
FEV1-%Change-Post: -4 %
FEV1-%Pred-Post: 75 %
FEV1-%Pred-Pre: 79 %
FEV1-Post: 2.49 L
FEV1-Pre: 2.61 L
FEV1FVC-%Change-Post: 3 %
FEV1FVC-%Pred-Pre: 110 %
FEV6-%Change-Post: -7 %
FEV6-%Pred-Post: 70 %
FEV6-%Pred-Pre: 75 %
FEV6-Post: 3 L
FEV6-Pre: 3.24 L
FEV6FVC-%Pred-Post: 106 %
FEV6FVC-%Pred-Pre: 106 %
FVC-%Change-Post: -7 %
FVC-%Pred-Post: 66 %
FVC-%Pred-Pre: 71 %
FVC-Post: 3 L
FVC-Pre: 3.26 L
Post FEV1/FVC ratio: 83 %
Post FEV6/FVC ratio: 100 %
Pre FEV1/FVC ratio: 80 %
Pre FEV6/FVC Ratio: 100 %
RV % pred: 55 %
RV: 1.49 L
TLC % pred: 69 %
TLC: 5.17 L

## 2021-06-26 NOTE — Progress Notes (Signed)
Full PFT performed today. °

## 2021-06-26 NOTE — Patient Instructions (Signed)
Full PFT performed today. °

## 2021-06-26 NOTE — Assessment & Plan Note (Signed)
Suspect due to obesity.  He has a reasonable functional capacity, does still exercise.  No clear evidence for obstruction.  From bronchodilator therapy for now.  Continue to follow clinically

## 2021-06-26 NOTE — Assessment & Plan Note (Signed)
Reliable with CPAP.  Plan to continue.

## 2021-06-26 NOTE — Patient Instructions (Signed)
We reviewed your CT scan and PET scan today.  We will plan to repeat your CT scan of the chest without contrast in June 2023. We reviewed your pulmonary function testing today.  We will not start any inhaled medication at this time. Follow Dr. Lamonte Sakai in June after your CT so we can review the results together.

## 2021-06-26 NOTE — Addendum Note (Signed)
Addended by: Dierdre Highman on: 06/26/2021 02:39 PM   Modules accepted: Orders

## 2021-06-26 NOTE — Progress Notes (Signed)
Subjective:    Patient ID: Gary Carroll, male    DOB: June 02, 1945, 76 y.o.   MRN: [id number]  HPI 76 year old gentleman with a history of tobacco use (47 pack years), GERD with HH, OSA on CPAP - cleans it every few months, osteoarthritis, atrial fibrillation, HTN, aortic stenosis, seasonal asthma, not on BD.   He reports that he has felt well. 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 Feels that he breathes well, he swims a lot. He has some cough, paroxysmal - often assoc w sneezing. Sometimes assoc w meals and reflux - not every day.  Non-productive. He tires easily.   He participates in the lung cancer screening program through the [place].  He had some micronodular disease noted on a low-dose CT 05/2020 that prompted a round of antibiotics.  His most recent lung cancer screening CT was in May as below.   LDCT 09/19/2020, report available for review.  I do not have the images.  Small foci.  Bronchovascular nodular consolidation in left upper lobe have decreased in size compared with January 2022.  There was note made of a few new small focal nodular areas in the left upper lobe up to 5 mm in size.  A repeat scan was recommended for 3 months (August 2022).   ROV 02/01/21 --follow-up visit 76 year old man with history of tobacco use, no real documented COPD although he is at risk for this.  He does have a history of OSA on CPAP, GERD, atrial fibrillation, hypertension, aortic stenosis, seasonal allergies.  He participates in the lung cancer screening program through the [place].  We repeated his CT chest at the [facility] 12/2020 to follow waxing and waning nodular foci, up to 5 mm in the left upper lobe. He has seen and reviewed with Dr Mateo Flow at the [facility]. He has a repeat set for November.   CT chest 12/20/20 from the [place], reviewed by me, some stable scattered old granulomatous disease with some calcification, some waxing and waning nodular consolidation in the left upper lobe, slightly increased in size but with some shifting  distribution that is suggestive of possible atypical mycobacterial infection.    ROV 03/30/21 -- 76 yo man known history tobacco use, OSA on CPAP, GERD, A. fib, hypertension, aortic stenosis, allergies.  He is at risk for obstructive lung disease although we have never really established this.  He participates in the lung cancer screening program to the [place] and has had waxing and waning nodular disease superimposed on some granulomatous nodules and calcified nodules. His most recent 2 scans were done 02/02/2021 and 03/22/2021, first done when he was admitted with urosepsis, septic shock, acute respiratory failure.   CT chest 02/02/2021 (while hospitalized) reviewed by me shows prominent mediastinal lymphadenopathy, enlarged right hilar node, left hilar node, paraesophageal nodes.  There is some left apical nodular opacity present  CT chest 03/22/2021 reviewed by me shows persistent prominent mediastinal nodes, slightly smaller than 2 months prior.  Persistent left upper lobe nodular process.  Adjacent nodules 14 mm, 12 mm both larger.  There is a left apical bilobed nodule 2.1 cm   ROV 06/26/21 --this follow-up visit for 76 year old gentleman with a history of tobacco use, OSA on CPAP.  He also has hypertension, A-fib, aortic stenosis and allergic rhinitis.  We have followed him for suspected COPD and also waxing and waning pulmonary nodular disease with mediastinal adenopathy that had initially been followed at the [place] through the lung cancer screening program. He remains active. He  a swimmer. He may be hearing some wheeze w exertion. He is reliable w CPAP, has some dry mouth.  ° °CT chest 03/22/2021 showed persistent prominent mediastinal nodes, persistent left upper lobe nodular process.  We had planned for PET scan done 04/23/2021.  The left upper lobe pulmonary nodule was decreased in size 1.7 cm (from 2.1 cm).  The nodule was hypermetabolic.  The left apical nodule was smaller as well. ° °Labs 12/19:  Mildly positive ANA 1: 40, ANCA negative, SSA/SSB negative, SCL 70 negative, ACE level 55, ESR 11, PSA 2.22 ° °Pulmonary function testing performed today and reviewed by me show possible mild mixed restriction and obstruction without a bronchodilator response.  Lung volumes confirm restriction, DLCO is normal. ° ° °Review of Systems °As per HPI ° ° °   °Objective:  ° Physical Exam ° °Vitals:  ° 06/26/21 1152  °BP: (!) 146/70  °Pulse: 64  °Temp: 97.9 °F (36.6 °C)  °TempSrc: Oral  °SpO2: 98%  °Weight: 275 lb (124.7 kg)  °Height: 6' (1.829 m)  ° °Gen: Pleasant, obese man, in no distress,  normal affect ° °ENT: No lesions,  mouth clear,  oropharynx clear, no postnasal drip ° °Neck: No JVD, no stridor ° °Lungs: No use of accessory muscles, no crackles or wheezing on normal respiration, no wheeze on forced expiration ° °Cardiovascular: RRR, harsh holosystolic murmur with intact S2.  2+ bilateral lower extremity edema with compression hose in place ° °Musculoskeletal: No deformities, no cyanosis or clubbing ° °Neuro: alert, awake, non focal ° °Skin: Warm, no lesions or rash ° ° °   °Assessment & Plan:  °Lung nodules °Etiology unclear.  There is been a waxing and waning nature to his pulmonary nodules.  They do show hypermetabolism.  Discussed the differential diagnosis with him today.  The decrease in size would argue against malignancy.  Clearly this would need to be followed with serial imaging.  His lab work does not show any clear autoimmune process although he does have a mildly positive ANA. ° °We reviewed your CT scan and PET scan today.  We will plan to repeat your CT scan of the chest without contrast in June 2023. °Follow Dr. Byrum in June after your CT so we can review the results together. ° °Sleep apnea °Reliable with CPAP.  Plan to continue. ° °Restrictive lung disease °Suspect due to obesity.  He has a reasonable functional capacity, does still exercise.  No clear evidence for obstruction.  From  bronchodilator therapy for now.  Continue to follow clinically ° °Robert Byrum, MD, PhD °06/26/2021, 1:33 PM °[facility] °[phone] or if no answer before 7:00PM call [phone] °For any issues after 7:00PM please call eLink [phone] ° °

## 2021-06-26 NOTE — Assessment & Plan Note (Signed)
Etiology unclear.  There is been a waxing and waning nature to his pulmonary nodules.  They do show hypermetabolism.  Discussed the differential diagnosis with him today.  The decrease in size would argue against malignancy.  Clearly this would need to be followed with serial imaging.  His lab work does not show any clear autoimmune process although he does have a mildly positive ANA.  We reviewed your CT scan and PET scan today.  We will plan to repeat your CT scan of the chest without contrast in June 2023. Follow Dr. Lamonte Sakai in June after your CT so we can review the results together.

## 2021-07-05 DIAGNOSIS — J069 Acute upper respiratory infection, unspecified: Secondary | ICD-10-CM | POA: Diagnosis not present

## 2021-07-05 DIAGNOSIS — R059 Cough, unspecified: Secondary | ICD-10-CM | POA: Diagnosis not present

## 2021-07-05 DIAGNOSIS — R0982 Postnasal drip: Secondary | ICD-10-CM | POA: Diagnosis not present

## 2021-07-30 ENCOUNTER — Ambulatory Visit (HOSPITAL_COMMUNITY): Payer: No Typology Code available for payment source

## 2021-08-14 ENCOUNTER — Ambulatory Visit (HOSPITAL_COMMUNITY): Payer: No Typology Code available for payment source

## 2021-08-17 DIAGNOSIS — T148XXA Other injury of unspecified body region, initial encounter: Secondary | ICD-10-CM | POA: Insufficient documentation

## 2021-08-17 DIAGNOSIS — R238 Other skin changes: Secondary | ICD-10-CM | POA: Diagnosis not present

## 2021-08-21 DIAGNOSIS — R238 Other skin changes: Secondary | ICD-10-CM | POA: Diagnosis not present

## 2021-08-22 ENCOUNTER — Telehealth: Payer: Self-pay | Admitting: Emergency Medicine

## 2021-08-22 ENCOUNTER — Ambulatory Visit (HOSPITAL_COMMUNITY): Payer: No Typology Code available for payment source

## 2021-08-23 NOTE — Telephone Encounter (Signed)
Spoke to pt & made him aware that we have the referral from the [place], but that the pre-service center is requesting a different NPI to be listed on the referral.  I explained that we have already requested this & that we will contact pt once CT is R/S.  Pt verbalized understanding & nothing further needed at this time.  ?

## 2021-08-27 DIAGNOSIS — R238 Other skin changes: Secondary | ICD-10-CM | POA: Diagnosis not present

## 2021-08-28 DIAGNOSIS — D6869 Other thrombophilia: Secondary | ICD-10-CM | POA: Diagnosis not present

## 2021-08-28 DIAGNOSIS — I110 Hypertensive heart disease with heart failure: Secondary | ICD-10-CM | POA: Diagnosis not present

## 2021-08-28 DIAGNOSIS — N400 Enlarged prostate without lower urinary tract symptoms: Secondary | ICD-10-CM | POA: Diagnosis not present

## 2021-08-28 DIAGNOSIS — E261 Secondary hyperaldosteronism: Secondary | ICD-10-CM | POA: Diagnosis not present

## 2021-08-28 DIAGNOSIS — K219 Gastro-esophageal reflux disease without esophagitis: Secondary | ICD-10-CM | POA: Diagnosis not present

## 2021-08-28 DIAGNOSIS — I509 Heart failure, unspecified: Secondary | ICD-10-CM | POA: Diagnosis not present

## 2021-08-28 DIAGNOSIS — E785 Hyperlipidemia, unspecified: Secondary | ICD-10-CM | POA: Diagnosis not present

## 2021-08-28 DIAGNOSIS — N529 Male erectile dysfunction, unspecified: Secondary | ICD-10-CM | POA: Diagnosis not present

## 2021-08-28 DIAGNOSIS — I4891 Unspecified atrial fibrillation: Secondary | ICD-10-CM | POA: Diagnosis not present

## 2021-08-28 DIAGNOSIS — N3941 Urge incontinence: Secondary | ICD-10-CM | POA: Diagnosis not present

## 2021-08-28 DIAGNOSIS — M1990 Unspecified osteoarthritis, unspecified site: Secondary | ICD-10-CM | POA: Diagnosis not present

## 2021-09-06 DIAGNOSIS — E782 Mixed hyperlipidemia: Secondary | ICD-10-CM | POA: Diagnosis not present

## 2021-09-06 DIAGNOSIS — E1169 Type 2 diabetes mellitus with other specified complication: Secondary | ICD-10-CM | POA: Diagnosis not present

## 2021-09-12 DIAGNOSIS — G4730 Sleep apnea, unspecified: Secondary | ICD-10-CM | POA: Diagnosis not present

## 2021-09-12 DIAGNOSIS — I5042 Chronic combined systolic (congestive) and diastolic (congestive) heart failure: Secondary | ICD-10-CM | POA: Diagnosis not present

## 2021-09-12 DIAGNOSIS — R339 Retention of urine, unspecified: Secondary | ICD-10-CM | POA: Diagnosis not present

## 2021-09-12 DIAGNOSIS — E782 Mixed hyperlipidemia: Secondary | ICD-10-CM | POA: Diagnosis not present

## 2021-09-12 DIAGNOSIS — I10 Essential (primary) hypertension: Secondary | ICD-10-CM | POA: Diagnosis not present

## 2021-09-12 DIAGNOSIS — I2510 Atherosclerotic heart disease of native coronary artery without angina pectoris: Secondary | ICD-10-CM | POA: Diagnosis not present

## 2021-09-12 DIAGNOSIS — N401 Enlarged prostate with lower urinary tract symptoms: Secondary | ICD-10-CM | POA: Diagnosis not present

## 2021-09-12 DIAGNOSIS — R9389 Abnormal findings on diagnostic imaging of other specified body structures: Secondary | ICD-10-CM | POA: Diagnosis not present

## 2021-09-12 DIAGNOSIS — I480 Paroxysmal atrial fibrillation: Secondary | ICD-10-CM | POA: Diagnosis not present

## 2021-09-12 DIAGNOSIS — E1169 Type 2 diabetes mellitus with other specified complication: Secondary | ICD-10-CM | POA: Diagnosis not present

## 2021-09-12 DIAGNOSIS — M810 Age-related osteoporosis without current pathological fracture: Secondary | ICD-10-CM | POA: Diagnosis not present

## 2021-09-29 DIAGNOSIS — R300 Dysuria: Secondary | ICD-10-CM | POA: Diagnosis not present

## 2021-09-29 DIAGNOSIS — N390 Urinary tract infection, site not specified: Secondary | ICD-10-CM | POA: Diagnosis not present

## 2021-09-29 DIAGNOSIS — B3749 Other urogenital candidiasis: Secondary | ICD-10-CM

## 2021-10-09 ENCOUNTER — Ambulatory Visit (HOSPITAL_COMMUNITY)
Admission: RE | Admit: 2021-10-09 | Discharge: 2021-10-09 | Disposition: A | Discharge status: Home / Self Care | Payer: No Typology Code available for payment source | Source: Ambulatory Visit | Attending: Adult Health | Admitting: Adult Health

## 2021-10-09 DIAGNOSIS — R918 Other nonspecific abnormal finding of lung field: Secondary | ICD-10-CM | POA: Insufficient documentation

## 2021-10-18 DIAGNOSIS — R600 Localized edema: Secondary | ICD-10-CM | POA: Insufficient documentation

## 2021-10-18 DIAGNOSIS — L89151 Pressure ulcer of sacral region, stage 1: Secondary | ICD-10-CM | POA: Insufficient documentation

## 2021-10-18 DIAGNOSIS — R9389 Abnormal findings on diagnostic imaging of other specified body structures: Secondary | ICD-10-CM | POA: Diagnosis not present

## 2021-10-18 DIAGNOSIS — I350 Nonrheumatic aortic (valve) stenosis: Secondary | ICD-10-CM

## 2021-10-18 DIAGNOSIS — L89159 Pressure ulcer of sacral region, unspecified stage: Secondary | ICD-10-CM | POA: Diagnosis not present

## 2021-10-18 DIAGNOSIS — I5042 Chronic combined systolic (congestive) and diastolic (congestive) heart failure: Secondary | ICD-10-CM | POA: Diagnosis not present

## 2021-11-26 DIAGNOSIS — E878 Other disorders of electrolyte and fluid balance, not elsewhere classified: Secondary | ICD-10-CM | POA: Insufficient documentation

## 2021-11-26 DIAGNOSIS — H6692 Otitis media, unspecified, left ear: Secondary | ICD-10-CM | POA: Diagnosis not present

## 2021-11-29 ENCOUNTER — Ambulatory Visit (INDEPENDENT_AMBULATORY_CARE_PROVIDER_SITE_OTHER): Payer: No Typology Code available for payment source | Admitting: Emergency Medicine

## 2021-11-29 ENCOUNTER — Encounter: Payer: Self-pay | Admitting: Emergency Medicine

## 2021-11-29 DIAGNOSIS — R918 Other nonspecific abnormal finding of lung field: Secondary | ICD-10-CM | POA: Diagnosis not present

## 2021-11-29 DIAGNOSIS — J984 Other disorders of lung: Secondary | ICD-10-CM | POA: Diagnosis not present

## 2021-11-29 DIAGNOSIS — G4730 Sleep apnea, unspecified: Secondary | ICD-10-CM

## 2021-11-29 DIAGNOSIS — J449 Chronic obstructive pulmonary disease, unspecified: Secondary | ICD-10-CM

## 2021-11-29 DIAGNOSIS — G4733 Obstructive sleep apnea (adult) (pediatric): Secondary | ICD-10-CM | POA: Insufficient documentation

## 2021-11-29 NOTE — Assessment & Plan Note (Signed)
Due to obesity.  Some component of deconditioning

## 2021-11-29 NOTE — Assessment & Plan Note (Signed)
On CPAP through the [place]

## 2021-11-29 NOTE — Assessment & Plan Note (Addendum)
His pulmonary nodules are smaller, this along with mild hypermetabolism on PET scan consistent with an indolent inflammatory process, question opportunistic infection like MAIC.  He is asymptomatic.  Unclear that this needs dedicated follow-up but would not be unreasonable to repeat his CT to correlate with any evolving symptoms.  If so could perform in 6 months.  I can do this through our office or he can get this done at the [place].  I will let him decide where he would like to pursue it.  We reviewed your CT scans of the chest and PET scan.  The left upper lobe pulmonary nodules that have been followed have decreased in size including on most recent CT chest done 10/09/2021.  This is good news as there is no evidence to support a cancerous process.  It would be reasonable to continue to follow your CT chest, follow the nodules and correlate these with any symptoms that you develop in case there is an inflammatory process present.  If you would like repeat CT this can be done either through the [place] or with Dr. Lamonte Sakai.  Would consider repeat scan in 6 months (November 2023).  Discussed this with your physicians at the [facility] so you can decide whether he would like to follow with Dr. Lamonte Sakai to keep track of this or not.

## 2021-11-29 NOTE — Progress Notes (Signed)
Subjective:    Patient ID: Gary Carroll, male    DOB: Jan 20, 1946, 76 y.o.   MRN: [id number]  HPI  ROV 06/26/21 --this follow-up visit for 76 year old gentleman with a history of tobacco use, OSA on CPAP.  He also has hypertension, A-fib, aortic stenosis and allergic rhinitis.  We have followed him for suspected COPD and also waxing and waning pulmonary nodular disease with mediastinal adenopathy that had initially been followed at the [place] through the lung cancer screening program. He remains active. He is a Academic librarian. He may be hearing some wheeze w exertion. He is reliable w CPAP, has some dry mouth.   CT chest 03/22/2021 showed persistent prominent mediastinal nodes, persistent left upper lobe nodular process.  We had planned for PET scan done 04/23/2021.  The left upper lobe pulmonary nodule was decreased in size 1.7 cm (from 2.1 cm).  The nodule was hypermetabolic.  The left apical nodule was smaller as well.  Labs 12/19: Mildly positive ANA 1: 40, ANCA negative, SSA/SSB negative, SCL 70 negative, ACE level 55, ESR 11, PSA 2.22  Pulmonary function testing performed today and reviewed by me show possible mild mixed restriction and obstruction without a bronchodilator response.  Lung volumes confirm restriction, DLCO is normal.   ROV 11/29/21 --Gary Carroll is 76 with a history of tobacco use, mixed obstruction and restriction on pulmonary function testing, not currently on bronchodilators.  He also has OSA on CPAP, hypertension, A-fib, aortic stenosis and allergic rhinitis.  I have followed him for this as well as waxing and waning pulmonary nodular disease and mediastinal adenopathy originally noted on lung cancer screening.  There was a hypermetabolic left upper lobe nodule but it had decreased in size on PET scan 04/23/2021.  Repeat scan done in May as below. He is feeling well, is able to exert, swim. No SOB.   CT chest 10/09/2021 reviewed by me, shows continued improvement in his left upper lobe  apical nodule, smaller but not completely resolved.  There is an 8 mm central component.  No other new nodules seen.  His mediastinal and hilar adenopathy is decreased in size.   Review of Systems As per HPI      Objective:   Physical Exam  Vitals:   11/29/21 1127  BP: 128/68  Pulse: (!) 50  Temp: 98.2 F (36.8 C)  TempSrc: Oral  SpO2: 97%  Weight: 265 lb (120.2 kg)  Height: 6' (1.829 m)   Gen: Pleasant, obese man, in no distress,  normal affect  ENT: No lesions,  mouth clear,  oropharynx clear, no postnasal drip  Neck: No JVD, no stridor  Lungs: No use of accessory muscles, no crackles or wheezing on normal respiration, no wheeze on forced expiration  Cardiovascular: RRR, harsh holosystolic murmur with intact S2.  2+ bilateral lower extremity edema with compression hose in place  Musculoskeletal: No deformities, no cyanosis or clubbing  Neuro: alert, awake, non focal  Skin: Warm, no lesions or rash      Assessment & Plan:  Lung nodules His pulmonary nodules are smaller, this along with mild hypermetabolism on PET scan consistent with an indolent inflammatory process, question opportunistic infection like MAIC.  He is asymptomatic.  Unclear that this needs dedicated follow-up but would not be unreasonable to repeat his CT to correlate with any evolving symptoms.  If so could perform in 6 months.  I can do this through our office or he can get this done at the [place].  I will  let him decide where he would like to pursue it.  We reviewed your CT scans of the chest and PET scan.  The left upper lobe pulmonary nodules that have been followed have decreased in size including on most recent CT chest done 10/09/2021.  This is good news as there is no evidence to support a cancerous process.  It would be reasonable to continue to follow your CT chest, follow the nodules and correlate these with any symptoms that you develop in case there is an inflammatory process present.  If you  would like repeat CT this can be done either through the [place] or with Dr. Lamonte Sakai.  Would consider repeat scan in 6 months (November 2023).  Discussed this with your physicians at the [facility] so you can decide whether he would like to follow with Dr. Lamonte Sakai to keep track of this or not.  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  He is not currently on bronchodilators.  He has good functional capacity.  We will hold off on starting BD for now.  Baltazar Apo, MD, PhD 11/29/2021, 12:18 PM [facility] [phone] or if no answer before 7:00PM call [phone] For any issues after 7:00PM please call eLink [phone]

## 2021-11-29 NOTE — Patient Instructions (Addendum)
We reviewed your CT scans of the chest and PET scan.  The left upper lobe pulmonary nodules that have been followed have decreased in size including on most recent CT chest done 10/09/2021.  This is good news as there is no evidence to support a cancerous process.  It would be reasonable to continue to follow your CT chest, follow the nodules and correlate these with any symptoms that you develop in case there is an inflammatory process present.  If you would like repeat CT this can be done either through the [place] or with Dr. Lamonte Sakai.  Would consider repeat scan in 6 months (November 2023).  Discussed this with your physicians at the [facility] so you can decide whether he would like to follow with Dr. Lamonte Sakai to keep track of this or not.

## 2021-11-29 NOTE — Assessment & Plan Note (Signed)
Likely some subclinical COPD.  He is not currently on bronchodilators.  He has good functional capacity.  We will hold off on starting BD for now.

## 2021-12-04 ENCOUNTER — Encounter (HOSPITAL_COMMUNITY)
Admission: RE | Admit: 2021-12-04 | Discharge: 2021-12-04 | Disposition: A | Discharge status: Home / Self Care | Payer: No Typology Code available for payment source | Source: Ambulatory Visit | Attending: Ophthalmology | Admitting: Ophthalmology

## 2021-12-04 NOTE — H&P (Signed)
Surgical History & Physical  Patient Name: Gary Carroll DOB: 1945-12-14  Surgery: Cataract extraction with intraocular lens implant phacoemulsification; Left Eye  Surgeon: Baruch Goldmann MD Surgery Date:  12-07-21 Pre-Op Date:  12-03-21  HPI: A 30 Yr. old male patient is referred by [facility] for cataract eval. 1. 1. The patient complains of difficulty when recognizing people, which began many years ago. Both eyes are affected. The episode is constant. The condition's severity increased since last visit. Symptoms occur when the patient is inside and outside. This is negatively affecting the patient's quality of life and the patient is unable to function adequately in life with the current level of vision. HPI was performed by Baruch Goldmann .  Medical History: Dry Eyes Cataracts ERM OU Heart Problem High Blood Pressure LDL Obstructive Uropathy  Review of Systems Negative Allergic/Immunologic Negative Cardiovascular Negative Constitutional Negative Ear, Nose, Mouth & Throat Negative Endocrine Negative Eyes Negative Gastrointestinal Negative Genitourinary Negative Hemotologic/Lymphatic Negative Integumentary Negative Musculoskeletal Negative Neurological Negative Psychiatry Negative Respiratory  Social   Never smoked   Medication Apixaban, Dutasteride, Terbinafine, Metoprolol, Tamsulosin, Rosuvastatin, Losartan, Furosemide, Multivitamin, Iron, Vitamin C, Magnesium, Potassium chloride,   Sx/Procedures Knee Replacement both, Gallbladder Removal, Broken leg,   Drug Allergies  Proscar, Tagamet,   History & Physical: Heent: Cataract, left eye NECK: supple without bruits LUNGS: lungs clear to auscultation CV: regular rate and rhythm Abdomen: soft and non-tender Impression & Plan: Assessment: 1.  COMBINED FORMS AGE RELATED CATARACT; Both Eyes (H25.813) 2.  BLEPHARITIS; Right Upper Lid, Right Lower Lid, Left Upper Lid, Left Lower Lid (H01.001, H01.002,H01.004,H01.005) 3.   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  Cataract accounts for the patient's decreased vision. This visual impairment is not correctable with a tolerable change in glasses or contact lenses. Cataract surgery with an implantation of a new lens should significantly improve the visual and functional status of the patient. Discussed all risks, benefits, alternatives, and potential complications. Discussed the procedures and recovery. Patient desires to have surgery. A-scan ordered and performed today for intra-ocular lens calculations. The surgery will be performed in order to improve vision for driving, reading, and for eye examinations. Recommend phacoemulsification with intra-ocular lens. Recommend Dextenza for post-operative pain and inflammation. Left Eye. Dilates poorly - shugarcaine by protocol. Malyugin Ring. Omidira. Epiretinal membrane - not a candidate.  2.  Recommend regular lid cleaning.  3.  Asymptomatic. Stable. Findings, prognosis and treatment options reviewed.

## 2021-12-07 ENCOUNTER — Ambulatory Visit (HOSPITAL_BASED_OUTPATIENT_CLINIC_OR_DEPARTMENT_OTHER): Payer: No Typology Code available for payment source | Admitting: Anesthesiology

## 2021-12-07 ENCOUNTER — Ambulatory Visit (HOSPITAL_COMMUNITY)
Admission: RE | Admit: 2021-12-07 | Discharge: 2021-12-07 | Disposition: A | Discharge status: Home / Self Care | Payer: No Typology Code available for payment source | Attending: Ophthalmology | Admitting: Ophthalmology

## 2021-12-07 ENCOUNTER — Ambulatory Visit (HOSPITAL_COMMUNITY): Payer: No Typology Code available for payment source | Admitting: Anesthesiology

## 2021-12-07 ENCOUNTER — Encounter (HOSPITAL_COMMUNITY)
Admission: RE | Disposition: A | Discharge status: Home / Self Care | Payer: Self-pay | Source: Home / Self Care | Attending: Ophthalmology

## 2021-12-07 ENCOUNTER — Encounter (HOSPITAL_COMMUNITY): Payer: Self-pay | Admitting: Ophthalmology

## 2021-12-07 DIAGNOSIS — H25813 Combined forms of age-related cataract, bilateral: Secondary | ICD-10-CM | POA: Insufficient documentation

## 2021-12-07 DIAGNOSIS — H2181 Floppy iris syndrome: Secondary | ICD-10-CM | POA: Diagnosis not present

## 2021-12-07 DIAGNOSIS — Z79899 Other long term (current) drug therapy: Secondary | ICD-10-CM | POA: Insufficient documentation

## 2021-12-07 DIAGNOSIS — I4891 Unspecified atrial fibrillation: Secondary | ICD-10-CM | POA: Diagnosis not present

## 2021-12-07 DIAGNOSIS — H0100B Unspecified blepharitis left eye, upper and lower eyelids: Secondary | ICD-10-CM | POA: Diagnosis not present

## 2021-12-07 DIAGNOSIS — H35373 Puckering of macula, bilateral: Secondary | ICD-10-CM | POA: Diagnosis not present

## 2021-12-07 DIAGNOSIS — I10 Essential (primary) hypertension: Secondary | ICD-10-CM | POA: Insufficient documentation

## 2021-12-07 DIAGNOSIS — Z87891 Personal history of nicotine dependence: Secondary | ICD-10-CM

## 2021-12-07 DIAGNOSIS — H269 Unspecified cataract: Secondary | ICD-10-CM

## 2021-12-07 DIAGNOSIS — G4730 Sleep apnea, unspecified: Secondary | ICD-10-CM | POA: Insufficient documentation

## 2021-12-07 DIAGNOSIS — H2512 Age-related nuclear cataract, left eye: Secondary | ICD-10-CM | POA: Diagnosis not present

## 2021-12-07 DIAGNOSIS — H0100A Unspecified blepharitis right eye, upper and lower eyelids: Secondary | ICD-10-CM | POA: Insufficient documentation

## 2021-12-07 DIAGNOSIS — H259 Unspecified age-related cataract: Secondary | ICD-10-CM

## 2021-12-07 HISTORY — PX: CATARACT EXTRACTION W/PHACO: SHX586

## 2021-12-07 SURGERY — PHACOEMULSIFICATION, CATARACT, WITH IOL INSERTION
Anesthesia: Monitor Anesthesia Care | Site: Eye | Laterality: Left

## 2021-12-07 MED ORDER — SODIUM HYALURONATE 10 MG/ML IO SOLUTION
PREFILLED_SYRINGE | INTRAOCULAR | Status: DC | PRN
  Administered 2021-12-07: 0.85 mL via INTRAOCULAR

## 2021-12-07 MED ORDER — PHENYLEPHRINE-KETOROLAC 1-0.3 % IO SOLN
INTRAOCULAR | Status: DC | PRN
  Administered 2021-12-07: 500 mL via OPHTHALMIC

## 2021-12-07 MED ORDER — TETRACAINE HCL 0.5 % OP SOLN
1.0000 [drp] | OPHTHALMIC | Status: AC | PRN
  Administered 2021-12-07 (×3): 1 [drp] via OPHTHALMIC

## 2021-12-07 MED ORDER — BSS IO SOLN
INTRAOCULAR | Status: DC | PRN
  Administered 2021-12-07: 15 mL via INTRAOCULAR

## 2021-12-07 MED ORDER — PHENYLEPHRINE HCL 2.5 % OP SOLN
1.0000 [drp] | OPHTHALMIC | Status: AC | PRN
  Administered 2021-12-07 (×3): 1 [drp] via OPHTHALMIC

## 2021-12-07 MED ORDER — STERILE WATER FOR IRRIGATION IR SOLN
Status: DC | PRN
  Administered 2021-12-07: 250 mL

## 2021-12-07 MED ORDER — NEOMYCIN-POLYMYXIN-DEXAMETH 3.5-10000-0.1 OP SUSP
OPHTHALMIC | Status: DC | PRN
  Administered 2021-12-07: 1 [drp] via OPHTHALMIC

## 2021-12-07 MED ORDER — EPINEPHRINE PF 1 MG/ML IJ SOLN
INTRAMUSCULAR | Status: AC
  Filled 2021-12-07: qty 2

## 2021-12-07 MED ORDER — POVIDONE-IODINE 5 % OP SOLN
OPHTHALMIC | Status: DC | PRN
  Administered 2021-12-07: 1 via OPHTHALMIC

## 2021-12-07 MED ORDER — LIDOCAINE HCL 3.5 % OP GEL
1.0000 | Freq: Once | OPHTHALMIC | Status: AC
Start: 2021-12-07 — End: 2021-12-07
  Administered 2021-12-07: 1 via OPHTHALMIC

## 2021-12-07 MED ORDER — LIDOCAINE HCL (PF) 1 % IJ SOLN
INTRAOCULAR | Status: DC | PRN
  Administered 2021-12-07: 1 mL via OPHTHALMIC

## 2021-12-07 MED ORDER — PHENYLEPHRINE-KETOROLAC 1-0.3 % IO SOLN
INTRAOCULAR | Status: AC
  Filled 2021-12-07: qty 4

## 2021-12-07 MED ORDER — TROPICAMIDE 1 % OP SOLN
1.0000 [drp] | OPHTHALMIC | Status: AC | PRN
  Administered 2021-12-07 (×3): 1 [drp] via OPHTHALMIC

## 2021-12-07 MED ORDER — SODIUM HYALURONATE 23MG/ML IO SOSY
PREFILLED_SYRINGE | INTRAOCULAR | Status: DC | PRN
  Administered 2021-12-07: 0.6 mL via INTRAOCULAR

## 2021-12-07 SURGICAL SUPPLY — 15 items
CATARACT SUITE SIGHTPATH (MISCELLANEOUS) ×2 IMPLANT
CLOTH BEACON ORANGE TIMEOUT ST (SAFETY) ×3 IMPLANT
EYE SHIELD UNIVERSAL CLEAR (GAUZE/BANDAGES/DRESSINGS) ×1 IMPLANT
FEE CATARACT SUITE SIGHTPATH (MISCELLANEOUS) ×2 IMPLANT
GLOVE BIOGEL PI IND STRL 7.0 (GLOVE) ×4 IMPLANT
GLOVE BIOGEL PI INDICATOR 7.0 (GLOVE) ×1
GLOVE SURG SS PI 7.0 STRL IVOR (GLOVE) ×1 IMPLANT
LENS IOL RAYNER 11.5 (Intraocular Lens) ×2 IMPLANT
LENS IOL RAYONE EMV 11.5 (Intraocular Lens) IMPLANT
PAD ARMBOARD 7.5X6 YLW CONV (MISCELLANEOUS) ×3 IMPLANT
RING MALYGIN 7.0 (MISCELLANEOUS) ×1 IMPLANT
SYR TB 1ML LL NO SAFETY (SYRINGE) ×3 IMPLANT
TAPE SURG TRANSPORE 1 IN (GAUZE/BANDAGES/DRESSINGS) IMPLANT
TAPE SURGICAL TRANSPORE 1 IN (GAUZE/BANDAGES/DRESSINGS) ×2
WATER STERILE IRR 250ML POUR (IV SOLUTION) ×3 IMPLANT

## 2021-12-07 NOTE — Anesthesia Preprocedure Evaluation (Signed)
Anesthesia Evaluation  Patient identified by MRN, date of birth, ID band Patient awake    Reviewed: Allergy & Precautions, NPO status , Patient's Chart, lab work & pertinent test results  Airway Mallampati: III  TM Distance: >3 FB Neck ROM: Full    Dental  (+) Dental Advisory Given   Pulmonary asthma , sleep apnea and Continuous Positive Airway Pressure Ventilation , former smoker,    Pulmonary exam normal breath sounds clear to auscultation       Cardiovascular Exercise Tolerance: Good hypertension, Pt. on medications Normal cardiovascular exam+ dysrhythmias Atrial Fibrillation + Valvular Problems/Murmurs  Rhythm:Regular Rate:Normal     Neuro/Psych PSYCHIATRIC DISORDERS  Neuromuscular disease    GI/Hepatic Neg liver ROS, hiatal hernia, GERD  Medicated,  Endo/Other  negative endocrine ROS  Renal/GU Renal disease  negative genitourinary   Musculoskeletal  (+) Arthritis , Osteoarthritis,    Abdominal   Peds negative pediatric ROS (+)  Hematology  (+) Blood dyscrasia, anemia ,   Anesthesia Other Findings   Reproductive/Obstetrics negative OB ROS                             Anesthesia Physical Anesthesia Plan  ASA: 3  Anesthesia Plan: MAC   Post-op Pain Management:    Induction:   PONV Risk Score and Plan:   Airway Management Planned: Nasal Cannula and Natural Airway  Additional Equipment:   Intra-op Plan:   Post-operative Plan:   Informed Consent: I have reviewed the patients History and Physical, chart, labs and discussed the procedure including the risks, benefits and alternatives for the proposed anesthesia with the patient or authorized representative who has indicated his/her understanding and acceptance.       Plan Discussed with: CRNA and Surgeon  Anesthesia Plan Comments:         Anesthesia Quick Evaluation

## 2021-12-07 NOTE — Anesthesia Postprocedure Evaluation (Signed)
Anesthesia Post Note  Patient: Gary Carroll  Procedure(s) Performed: CATARACT EXTRACTION PHACO AND INTRAOCULAR LENS PLACEMENT (IOC) (Left: Eye)  Patient location during evaluation: Phase II Anesthesia Type: MAC Level of consciousness: awake and alert and oriented Pain management: pain level controlled Vital Signs Assessment: post-procedure vital signs reviewed and stable Respiratory status: spontaneous breathing, nonlabored ventilation and respiratory function stable Cardiovascular status: blood pressure returned to baseline and stable Postop Assessment: no apparent nausea or vomiting Anesthetic complications: no   No notable events documented.   Last Vitals:  Vitals:   12/07/21 0941 12/07/21 1038  BP: (!) 155/67 (!) 175/72  Pulse: 70 70  Resp: 20 18  Temp: 36.7 C (!) 36.4 C  SpO2: 97% 100%    Last Pain:  Vitals:   12/07/21 1038  TempSrc: Oral  PainSc: 0-No pain                 Tishawna Larouche C Jenaya Saar

## 2021-12-07 NOTE — Transfer of Care (Signed)
Immediate Anesthesia Transfer of Care Note  Patient: Gary Carroll  Procedure(s) Performed: CATARACT EXTRACTION PHACO AND INTRAOCULAR LENS PLACEMENT (IOC) (Left: Eye)  Patient Location: Short Stay  Anesthesia Type:MAC  Level of Consciousness: awake, alert  and oriented  Airway & Oxygen Therapy: Patient Spontanous Breathing  Post-op Assessment: Report given to RN and Post -op Vital signs reviewed and stable  Post vital signs: Reviewed and stable  Last Vitals:  Vitals Value Taken Time  BP    Temp    Pulse    Resp    SpO2      Last Pain:  Vitals:   12/07/21 0941  TempSrc: Oral  PainSc: 0-No pain         Complications: No notable events documented.

## 2021-12-07 NOTE — Interval H&P Note (Signed)
History and Physical Interval Note:  12/07/2021 10:11 AM  Gary Carroll  has presented today for surgery, with the diagnosis of combined forms age related cataract; left.  The various methods of treatment have been discussed with the patient and family. After consideration of risks, benefits and other options for treatment, the patient has consented to  Procedure(s) with comments: CATARACT EXTRACTION PHACO AND INTRAOCULAR LENS PLACEMENT ([place]) (Left) - left as a surgical intervention.  The patient's history has been reviewed, patient examined, no change in status, stable for surgery.  I have reviewed the patient's chart and labs.  Questions were answered to the patient's satisfaction.     Baruch Goldmann

## 2021-12-07 NOTE — Anesthesia Procedure Notes (Signed)
Procedure Name: MAC Date/Time: 12/07/2021 10:19 AM  Performed by: Orlie Dakin, CRNAPre-anesthesia Checklist: Patient identified, Emergency Drugs available, Suction available and Patient being monitored Oxygen Delivery Method: Nasal cannula Placement Confirmation: positive ETCO2

## 2021-12-07 NOTE — Discharge Instructions (Signed)
Please discharge patient when stable, will follow up today with Dr. Miriya Cloer at the [facility] office immediately following discharge.  Leave shield in place until visit.  All paperwork with discharge instructions will be given at the office.   [facility] Address:  [address]  , [place] [address]  

## 2021-12-07 NOTE — Op Note (Signed)
Date of procedure: 12/07/21  Pre-operative diagnosis: Visually significant age-related cataract, Left Eye; Poor dilation, Left eye (H25.?2)   Post-operative diagnosis: Visually significant age-related cataract, Left Eye; Intra-operative Floppy Iris Syndrome, Left Eye (H21.81)  Procedure: Complex removal of cataract via phacoemulsification and insertion of intra-ocular lens Rayner RAO200E +11.5D into the capsular bag of the Left Eye (CPT [phone])  Attending surgeon: Gerda Diss. Madasyn Heath, MD, MA  Anesthesia: MAC, Topical Akten  Complications: None  Estimated Blood Loss: <27m (minimal)  Specimens: None  Implants: As above  Indications:  Visually significant cataract, Left Eye  Procedure:  The patient was seen and identified in the pre-operative area. The operative eye was identified and dilated.  The operative eye was marked.  Topical anesthesia was administered to the operative eye.     The patient was then to the operative suite and placed in the supine position.  A timeout was performed confirming the patient, procedure to be performed, and all other relevant information.   The patient's face was prepped and draped in the usual fashion for intra-ocular surgery.  A lid speculum was placed into the operative eye and the surgical microscope moved into place and focused.  Poor dilation of the iris was confirmed.  An inferotemporal paracentesis was created using a 20 gauge paracentesis blade.  Shugarcaine was injected into the anterior chamber.  Viscoelastic was injected into the anterior chamber.  A temporal clear-corneal main wound incision was created using a 2.46mmicrokeratome.  A Malyugin ring was placed.  A continuous curvilinear capsulorrhexis was initiated using an irrigating cystitome and completed using capsulorrhexis forceps.  Hydrodissection and hydrodeliniation were performed.  Viscoelastic was injected into the anterior chamber.  A phacoemulsification handpiece and a chopper as a second  instrument were used to remove the nucleus and epinucleus. The irrigation/aspiration handpiece was used to remove any remaining cortical material.   The capsular bag was reinflated with viscoelastic, checked, and found to be intact.  The intraocular lens was inserted into the capsular bag and dialed into place using a MaSurveyor, mineralsThe Malyugin ring was removed.  The irrigation/aspiration handpiece was used to remove any remaining viscoelastic.  The clear corneal wound and paracentesis wounds were then hydrated and checked with Weck-Cels to be watertight.  The lid-speculum and drape was removed, and the patient's face was cleaned with a wet and dry 4x4.  Maxitrol was instilled in the eye. A clear shield was taped over the eye. The patient was taken to the post-operative care unit in good condition, having tolerated the procedure well.  Post-Op Instructions: [facility] and will receive all other orders and instructions.

## 2021-12-10 ENCOUNTER — Encounter (HOSPITAL_COMMUNITY): Payer: Self-pay | Admitting: Ophthalmology

## 2021-12-17 ENCOUNTER — Encounter (HOSPITAL_COMMUNITY): Payer: Self-pay

## 2021-12-17 ENCOUNTER — Other Ambulatory Visit: Payer: Self-pay

## 2021-12-17 ENCOUNTER — Encounter (HOSPITAL_COMMUNITY)
Admission: RE | Admit: 2021-12-17 | Discharge: 2021-12-17 | Disposition: A | Discharge status: Home / Self Care | Payer: No Typology Code available for payment source | Source: Ambulatory Visit | Attending: Ophthalmology | Admitting: Ophthalmology

## 2021-12-17 NOTE — H&P (Signed)
Surgical History &   Patient Name: Gary Carroll DOB: 10-30-1945  Surgery: Cataract extraction with intraocular lens implant phacoemulsification; Right Eye  Surgeon: Baruch Goldmann MD Surgery Date:  12-21-21 Pre-Op Date:  12-13-21  HPI: A 57 Yr. old male patient 1. The patient is returning after cataract surgery. The left eye is affected. Status post cataract surgery, which began 6 days ago: Since the last visit, the affected area is doing well. The patient's vision is improved. Patient is following medication instructions. Occasional doubled vision. 2. 2. The patient is returning for a cataract follow-up of the right eye. Since the last visit, the affected area is worsening. The patient's vision is blurry. The complaint is associated with difficulty reading small print on medicine bottles/forms/labels and difficulty recognizing people at a distance. This is negatively affecting the patient's quality of life and the patient is unable to function adequately in life with the current level of vision. HPI was performed by Baruch Goldmann .  Medical History: Dry Eyes Cataracts ERM OU Heart Problem High Blood Pressure LDL Obstructive Uropathy  Review of Systems Negative Allergic/Immunologic Negative Cardiovascular Negative Constitutional Negative Ear, Nose, Mouth & Throat Negative Endocrine Negative Eyes Negative Gastrointestinal Negative Genitourinary Negative Hemotologic/Lymphatic Negative Integumentary Negative Musculoskeletal Negative Neurological Negative Psychiatry Negative Respiratory  Social   Never smoked   Medication Prednisolone-Moxifloxacin-Bromfenac,  Apixaban, Dutasteride, Terbinafine, Metoprolol, Tamsulosin, Rosuvastatin, Losartan, Furosemide, Multivitamin, Iron, Vitamin C, Magnesium, Potassium chloride,   Sx/Procedures Phaco c IOL OS,  Knee Replacement both, Gallbladder Removal, Broken leg,   Drug Allergies  Proscar, Tagamet,   History & Physical: Heent:  Cataract, right eye NECK: supple without bruits LUNGS: lungs clear to auscultation CV: regular rate and rhythm Abdomen: soft and non-tender Impression & Plan: Assessment: 1.  CATARACT EXTRACTION STATUS; Left Eye (Z98.42) 2.  COMBINED FORMS AGE RELATED CATARACT; Right Eye (H25.811)  Plan: 1.  1 week after cataract surgery. Doing well with improved vision and normal eye pressure. Call with any problems or concerns. Continue Pred-Moxi-Brom 2x/day for 3 more weeks.  2.  Cataract accounts for the patient's decreased vision. This visual impairment is not correctable with a tolerable change in glasses or contact lenses. Cataract surgery with an implantation of a new lens should significantly improve the visual and functional status of the patient. Discussed all risks, benefits, alternatives, and potential complications. Discussed the procedures and recovery. Patient desires to have surgery. A-scan ordered and performed today for intra-ocular lens calculations. The surgery will be performed in order to improve vision for driving, reading, and for eye examinations. Recommend phacoemulsification with intra-ocular lens. Recommend Dextenza for post-operative pain and inflammation. Right Eye. Surgery required to correct imbalance of vision. Dilates poorly - shugarcaine by protocol. Malyugin Ring. Omidira.

## 2021-12-20 DIAGNOSIS — E782 Mixed hyperlipidemia: Secondary | ICD-10-CM | POA: Diagnosis not present

## 2021-12-20 DIAGNOSIS — E1169 Type 2 diabetes mellitus with other specified complication: Secondary | ICD-10-CM | POA: Diagnosis not present

## 2021-12-21 ENCOUNTER — Ambulatory Visit (HOSPITAL_BASED_OUTPATIENT_CLINIC_OR_DEPARTMENT_OTHER): Payer: No Typology Code available for payment source | Admitting: Anesthesiology

## 2021-12-21 ENCOUNTER — Ambulatory Visit (HOSPITAL_COMMUNITY)
Admission: RE | Admit: 2021-12-21 | Discharge: 2021-12-21 | Disposition: A | Discharge status: Home / Self Care | Payer: No Typology Code available for payment source | Attending: Ophthalmology | Admitting: Ophthalmology

## 2021-12-21 ENCOUNTER — Encounter (HOSPITAL_COMMUNITY): Payer: Self-pay | Admitting: Ophthalmology

## 2021-12-21 ENCOUNTER — Ambulatory Visit (HOSPITAL_COMMUNITY): Payer: No Typology Code available for payment source | Admitting: Anesthesiology

## 2021-12-21 ENCOUNTER — Encounter (HOSPITAL_COMMUNITY)
Admission: RE | Disposition: A | Discharge status: Home / Self Care | Payer: Self-pay | Source: Home / Self Care | Attending: Ophthalmology

## 2021-12-21 DIAGNOSIS — H2181 Floppy iris syndrome: Secondary | ICD-10-CM | POA: Diagnosis not present

## 2021-12-21 DIAGNOSIS — K219 Gastro-esophageal reflux disease without esophagitis: Secondary | ICD-10-CM | POA: Diagnosis not present

## 2021-12-21 DIAGNOSIS — I10 Essential (primary) hypertension: Secondary | ICD-10-CM

## 2021-12-21 DIAGNOSIS — Z87891 Personal history of nicotine dependence: Secondary | ICD-10-CM

## 2021-12-21 DIAGNOSIS — H25811 Combined forms of age-related cataract, right eye: Secondary | ICD-10-CM | POA: Diagnosis present

## 2021-12-21 HISTORY — PX: CATARACT EXTRACTION W/PHACO: SHX586

## 2021-12-21 SURGERY — PHACOEMULSIFICATION, CATARACT, WITH IOL INSERTION
Anesthesia: Monitor Anesthesia Care | Site: Eye | Laterality: Right

## 2021-12-21 MED ORDER — STERILE WATER FOR IRRIGATION IR SOLN
Status: DC | PRN
  Administered 2021-12-21: 250 mL

## 2021-12-21 MED ORDER — SODIUM HYALURONATE 23MG/ML IO SOSY
PREFILLED_SYRINGE | INTRAOCULAR | Status: DC | PRN
  Administered 2021-12-21: 0.6 mL via INTRAOCULAR

## 2021-12-21 MED ORDER — TETRACAINE HCL 0.5 % OP SOLN
1.0000 [drp] | OPHTHALMIC | Status: AC | PRN
  Administered 2021-12-21 (×3): 1 [drp] via OPHTHALMIC

## 2021-12-21 MED ORDER — TROPICAMIDE 1 % OP SOLN
1.0000 [drp] | OPHTHALMIC | Status: AC | PRN
  Administered 2021-12-21 (×3): 1 [drp] via OPHTHALMIC

## 2021-12-21 MED ORDER — BSS IO SOLN
INTRAOCULAR | Status: DC | PRN
  Administered 2021-12-21: 15 mL via INTRAOCULAR

## 2021-12-21 MED ORDER — SODIUM HYALURONATE 10 MG/ML IO SOLUTION
PREFILLED_SYRINGE | INTRAOCULAR | Status: DC | PRN
  Administered 2021-12-21: 0.85 mL via INTRAOCULAR

## 2021-12-21 MED ORDER — LIDOCAINE HCL 3.5 % OP GEL: 1.0000 | Freq: Once | OPHTHALMIC | Status: DC

## 2021-12-21 MED ORDER — PHENYLEPHRINE-KETOROLAC 1-0.3 % IO SOLN
INTRAOCULAR | Status: AC
  Filled 2021-12-21: qty 4

## 2021-12-21 MED ORDER — NEOMYCIN-POLYMYXIN-DEXAMETH 3.5-10000-0.1 OP SUSP
OPHTHALMIC | Status: DC | PRN
  Administered 2021-12-21: 2 [drp] via OPHTHALMIC

## 2021-12-21 MED ORDER — LIDOCAINE HCL (PF) 1 % IJ SOLN
INTRAOCULAR | Status: DC | PRN
  Administered 2021-12-21: .6 mL via OPHTHALMIC

## 2021-12-21 MED ORDER — EPINEPHRINE PF 1 MG/ML IJ SOLN
INTRAMUSCULAR | Status: AC
  Filled 2021-12-21: qty 2

## 2021-12-21 MED ORDER — POVIDONE-IODINE 5 % OP SOLN
OPHTHALMIC | Status: DC | PRN
  Administered 2021-12-21: 1 via OPHTHALMIC

## 2021-12-21 MED ORDER — PHENYLEPHRINE HCL 2.5 % OP SOLN
1.0000 [drp] | OPHTHALMIC | Status: AC | PRN
  Administered 2021-12-21 (×3): 1 [drp] via OPHTHALMIC

## 2021-12-21 MED ORDER — PHENYLEPHRINE-KETOROLAC 1-0.3 % IO SOLN
INTRAOCULAR | Status: DC | PRN
  Administered 2021-12-21: 500 mL via OPHTHALMIC

## 2021-12-21 SURGICAL SUPPLY — 15 items
CATARACT SUITE SIGHTPATH (MISCELLANEOUS) ×2 IMPLANT
CLOTH BEACON ORANGE TIMEOUT ST (SAFETY) ×3 IMPLANT
EYE SHIELD UNIVERSAL CLEAR (GAUZE/BANDAGES/DRESSINGS) ×1 IMPLANT
FEE CATARACT SUITE SIGHTPATH (MISCELLANEOUS) ×2 IMPLANT
GLOVE BIOGEL PI IND STRL 7.0 (GLOVE) ×4 IMPLANT
GLOVE BIOGEL PI INDICATOR 7.0 (GLOVE) ×2
GLOVE SURG SS PI 7.0 STRL IVOR (GLOVE) ×1 IMPLANT
LENS IOL RAYNER 11.0 ×2 IMPLANT
LENS IOL RAYONE EMV 11.0 IMPLANT
PAD ARMBOARD 7.5X6 YLW CONV (MISCELLANEOUS) ×3 IMPLANT
RING MALYGIN 7.0 (MISCELLANEOUS) ×1 IMPLANT
SYR TB 1ML LL NO SAFETY (SYRINGE) ×3 IMPLANT
TAPE SURG TRANSPORE 1 IN (GAUZE/BANDAGES/DRESSINGS) IMPLANT
TAPE SURGICAL TRANSPORE 1 IN (GAUZE/BANDAGES/DRESSINGS) ×2
WATER STERILE IRR 250ML POUR (IV SOLUTION) ×3 IMPLANT

## 2021-12-21 NOTE — Op Note (Signed)
Date of procedure: 12/21/21  Pre-operative diagnosis: Visually significant age-related combined form cataract, Right Eye; Poor Dilation, Right Eye (H25.811)  Post-operative diagnosis: Visually significant age-related cataract, Right Eye; Intra-operative Floppy Iris Syndrome, Right Eye (H21.81)  Procedure: Removal of cataract via phacoemulsification and insertion of intra-ocular lens Rayner [id number] +11.0D into the capsular bag of the Right Eye (CPT [phone])  Attending surgeon: Gerda Diss. Retina Bernardy, MD, MA  Anesthesia: MAC, Topical Akten  Complications: None  Estimated Blood Loss: <40m (minimal)  Specimens: None  Implants: As above  Indications:  Visually significant cataract, Right Eye  Procedure:  The patient was seen and identified in the pre-operative area. The operative eye was identified and dilated.  The operative eye was marked.  Topical anesthesia was administered to the operative eye.     The patient was then to the operative suite and placed in the supine position.  A timeout was performed confirming the patient, procedure to be performed, and all other relevant information.   The patient's face was prepped and draped in the usual fashion for intra-ocular surgery.  A lid speculum was placed into the operative eye and the surgical microscope moved into place and focused.  Poor dilation of the iris was confirmed.  A superotemporal paracentesis was created using a 20 gauge paracentesis blade.  Shugarcaine was injected into the anterior chamber.  Viscoelastic was injected into the anterior chamber.  A temporal clear-corneal main wound incision was created using a 2.436mmicrokeratome.  A Malyugin ring was placed.  A continuous curvilinear capsulorrhexis was initiated using an irrigating cystitome and completed using capsulorrhexis forceps.  Hydrodissection and hydrodeliniation were performed.  Viscoelastic was injected into the anterior chamber.  A phacoemulsification handpiece and a chopper  as a second instrument were used to remove the nucleus and epinucleus. The irrigation/aspiration handpiece was used to remove any remaining cortical material.   The capsular bag was reinflated with viscoelastic, checked, and found to be intact.  The intraocular lens was inserted into the capsular bag and dialed into place using a MaSurveyor, minerals The Malyugin ring was removed.  The irrigation/aspiration handpiece was used to remove any remaining viscoelastic.  The clear corneal wound and paracentesis wounds were then hydrated and checked with Weck-Cels to be watertight.  The lid-speculum and drape was removed, and the patient's face was cleaned with a wet and dry 4x4.  Maxitrol was instilled in the eye. A clear shield was taped over the eye. The patient was taken to the post-operative care unit in good condition, having tolerated the procedure well.  Post-Op Instructions: [facility] and will receive all other orders and instructions.

## 2021-12-21 NOTE — Anesthesia Postprocedure Evaluation (Signed)
Anesthesia Post Note  Patient: Gary Carroll  Procedure(s) Performed: CATARACT EXTRACTION PHACO AND INTRAOCULAR LENS PLACEMENT (IOC) (Right: Eye)  Patient location during evaluation: Phase II Anesthesia Type: MAC Level of consciousness: awake Pain management: pain level controlled Vital Signs Assessment: post-procedure vital signs reviewed and stable Respiratory status: spontaneous breathing and respiratory function stable Cardiovascular status: blood pressure returned to baseline and stable Postop Assessment: no headache and no apparent nausea or vomiting Anesthetic complications: no Comments: Late entry   No notable events documented.   Last Vitals:  Vitals:   12/21/21 0905 12/21/21 1012  BP: (!) 142/58 (!) 156/74  Pulse: 66 62  Resp: 19 19  Temp: 36.5 C 36.4 C  SpO2: 96% 100%    Last Pain:  Vitals:   12/21/21 1012  TempSrc: Oral  PainSc: 0-No pain                 Louann Sjogren

## 2021-12-21 NOTE — Transfer of Care (Signed)
Immediate Anesthesia Transfer of Care Note  Patient: Gary Carroll  Procedure(s) Performed: CATARACT EXTRACTION PHACO AND INTRAOCULAR LENS PLACEMENT (IOC) (Right: Eye)  Patient Location: PACU  Anesthesia Type:MAC  Level of Consciousness: awake, alert  and oriented  Airway & Oxygen Therapy: Patient Spontanous Breathing  Post-op Assessment: Report given to RN and Post -op Vital signs reviewed and stable  Post vital signs: Reviewed and stable  Last Vitals:  Vitals Value Taken Time  BP 145/72   Temp    Pulse 68   Resp 14   SpO2 99%%     Last Pain:  Vitals:   12/21/21 0905  TempSrc: Oral         Complications: No notable events documented.

## 2021-12-21 NOTE — Discharge Instructions (Addendum)
Please discharge patient when stable, will follow up today with Dr. Wrzosek at the [facility] [place] office immediately following discharge.  Leave shield in place until visit.  All paperwork with discharge instructions will be given at the office.  [facility] Address:  [address]  [place], [place] [address]  

## 2021-12-21 NOTE — Anesthesia Preprocedure Evaluation (Signed)
Anesthesia Evaluation  Patient identified by MRN, date of birth, ID band Patient awake    Reviewed: Allergy & Precautions, NPO status , Patient's Chart, lab work & pertinent test results  Airway Mallampati: III  TM Distance: >3 FB Neck ROM: Full    Dental  (+) Dental Advisory Given   Pulmonary asthma , sleep apnea and Continuous Positive Airway Pressure Ventilation , former smoker,    Pulmonary exam normal breath sounds clear to auscultation       Cardiovascular Exercise Tolerance: Good hypertension, Pt. on medications Normal cardiovascular exam+ dysrhythmias Atrial Fibrillation + Valvular Problems/Murmurs  Rhythm:Regular Rate:Normal     Neuro/Psych PSYCHIATRIC DISORDERS  Neuromuscular disease    GI/Hepatic Neg liver ROS, hiatal hernia, GERD  Medicated,  Endo/Other  negative endocrine ROS  Renal/GU Renal disease  negative genitourinary   Musculoskeletal  (+) Arthritis , Osteoarthritis,    Abdominal   Peds negative pediatric ROS (+)  Hematology  (+) Blood dyscrasia, anemia ,   Anesthesia Other Findings   Reproductive/Obstetrics negative OB ROS                             Anesthesia Physical  Anesthesia Plan  ASA: 3  Anesthesia Plan: MAC   Post-op Pain Management:    Induction:   PONV Risk Score and Plan:   Airway Management Planned: Nasal Cannula and Natural Airway  Additional Equipment:   Intra-op Plan:   Post-operative Plan:   Informed Consent: I have reviewed the patients History and Physical, chart, labs and discussed the procedure including the risks, benefits and alternatives for the proposed anesthesia with the patient or authorized representative who has indicated his/her understanding and acceptance.       Plan Discussed with: CRNA and Surgeon  Anesthesia Plan Comments:         Anesthesia Quick Evaluation

## 2021-12-21 NOTE — Interval H&P Note (Signed)
History and Physical Interval Note:  12/21/2021 9:43 AM  Gary Carroll  has presented today for surgery, with the diagnosis of combined forms age related cataract; right.  The various methods of treatment have been discussed with the patient and family. After consideration of risks, benefits and other options for treatment, the patient has consented to  Procedure(s) with comments: CATARACT EXTRACTION PHACO AND INTRAOCULAR LENS PLACEMENT (IOC) (Right) - CDE:  as a surgical intervention.  The patient's history has been reviewed, patient examined, no change in status, stable for surgery.  I have reviewed the patient's chart and labs.  Questions were answered to the patient's satisfaction.     Baruch Goldmann

## 2021-12-24 ENCOUNTER — Encounter (HOSPITAL_COMMUNITY): Payer: Self-pay | Admitting: Ophthalmology

## 2021-12-25 DIAGNOSIS — I10 Essential (primary) hypertension: Secondary | ICD-10-CM | POA: Diagnosis not present

## 2021-12-25 DIAGNOSIS — I5042 Chronic combined systolic (congestive) and diastolic (congestive) heart failure: Secondary | ICD-10-CM | POA: Diagnosis not present

## 2021-12-25 DIAGNOSIS — R9389 Abnormal findings on diagnostic imaging of other specified body structures: Secondary | ICD-10-CM | POA: Diagnosis not present

## 2021-12-25 DIAGNOSIS — E782 Mixed hyperlipidemia: Secondary | ICD-10-CM | POA: Diagnosis not present

## 2021-12-25 DIAGNOSIS — G4730 Sleep apnea, unspecified: Secondary | ICD-10-CM | POA: Diagnosis not present

## 2021-12-25 DIAGNOSIS — I480 Paroxysmal atrial fibrillation: Secondary | ICD-10-CM | POA: Diagnosis not present

## 2021-12-25 DIAGNOSIS — M810 Age-related osteoporosis without current pathological fracture: Secondary | ICD-10-CM | POA: Diagnosis not present

## 2021-12-25 DIAGNOSIS — E1169 Type 2 diabetes mellitus with other specified complication: Secondary | ICD-10-CM | POA: Diagnosis not present

## 2021-12-25 DIAGNOSIS — N401 Enlarged prostate with lower urinary tract symptoms: Secondary | ICD-10-CM | POA: Diagnosis not present

## 2021-12-25 DIAGNOSIS — R339 Retention of urine, unspecified: Secondary | ICD-10-CM | POA: Diagnosis not present

## 2021-12-25 DIAGNOSIS — I2510 Atherosclerotic heart disease of native coronary artery without angina pectoris: Secondary | ICD-10-CM | POA: Diagnosis not present

## 2022-01-29 DIAGNOSIS — R238 Other skin changes: Secondary | ICD-10-CM | POA: Diagnosis not present

## 2022-01-29 DIAGNOSIS — Z23 Encounter for immunization: Secondary | ICD-10-CM | POA: Diagnosis not present

## 2022-02-05 DIAGNOSIS — B356 Tinea cruris: Secondary | ICD-10-CM | POA: Diagnosis not present

## 2022-02-05 DIAGNOSIS — R238 Other skin changes: Secondary | ICD-10-CM | POA: Diagnosis not present

## 2022-02-06 ENCOUNTER — Telehealth: Payer: Self-pay | Admitting: Cardiology

## 2022-02-06 NOTE — Telephone Encounter (Signed)
Called patient to schedule appt, he stated he goes to the [place] to a cardiologist.

## 2022-02-13 ENCOUNTER — Ambulatory Visit (HOSPITAL_COMMUNITY): Payer: Medicare HMO | Attending: Internal Medicine | Admitting: Physical Therapy

## 2022-02-13 DIAGNOSIS — R600 Localized edema: Secondary | ICD-10-CM | POA: Diagnosis not present

## 2022-02-13 DIAGNOSIS — S81801S Unspecified open wound, right lower leg, sequela: Secondary | ICD-10-CM | POA: Insufficient documentation

## 2022-02-13 NOTE — Therapy (Signed)
OUTPATIENT PHYSICAL THERAPY NEURO EVALUATION   Patient Name: Gary Carroll MRN: [id number] DOB:June 03, 1945, 76 y.o., male Today's Date: 02/13/2022   PCP: Dr. Allyn Kenner REFERRING PROVIDER: Cecile Sheerer   PT End of Session - 02/13/22 1307     Visit Number 1    Number of Visits 6   Pt only able to come once a week due to co-pay   Date for PT Re-Evaluation 03/27/22    Authorization Type AETNA no limit    Progress Note Due on Visit 6    PT Start Time 0950    PT Stop Time 1030    PT Time Calculation (min) 40 min    Activity Tolerance Patient tolerated treatment well    Behavior During Therapy [facility] for tasks assessed/performed             Past Medical History:  Diagnosis Date   Allergic rhinitis    Arthritis    Asthma    seasonal per pt   Bilateral knee pain    OA   Diverticulosis 02/2012   seen on colonoscopy   Erectile dysfunction    GERD (gastroesophageal reflux disease)    Heart murmur    Hiatal hernia    HLD (hyperlipidemia)    HTN (hypertension)    Hydronephrosis determined by ultrasound 08/22/2015   Overactive bladder    Prediabetes    Pulmonary lesion    Ringing of ears    Sleep apnea    Urinary frequency    Urinary urgency    Wears glasses    Past Surgical History:  Procedure Laterality Date   CATARACT EXTRACTION W/PHACO Left 12/07/2021   Procedure: CATARACT EXTRACTION PHACO AND INTRAOCULAR LENS PLACEMENT ([place]);  Surgeon: Baruch Goldmann, MD;  Location: [facility];  Service: Ophthalmology;  Laterality: Left;  CDE 7.75   CATARACT EXTRACTION W/PHACO Right 12/21/2021   Procedure: CATARACT EXTRACTION PHACO AND INTRAOCULAR LENS PLACEMENT (IOC);  Surgeon: Baruch Goldmann, MD;  Location: [facility];  Service: Ophthalmology;  Laterality: Right;  CDE: 8.00   CHOLECYSTECTOMY     COLONOSCOPY  02/2012   Dr. Deatra Ina: sigmoid, transverse, and ascending colon diverticulosis   FINGER SURGERY Right    prostate procedure  2017   per patient, was having trouble urinating     TONSILLECTOMY     TOTAL KNEE ARTHROPLASTY  2012   left (Dr. Lorre Nick)   TOTAL KNEE ARTHROPLASTY Right 06/19/2015   Procedure: TOTAL KNEE ARTHROPLASTY;  Surgeon: Vickey Huger, MD;  Location: [facility];  Service: Orthopedics;  Laterality: Right;   Patient Active Problem List   Diagnosis Date Noted   Obstructive lung disease (generalized) ([place]) 11/29/2021   Restrictive lung disease 06/26/2021   Lung nodules 04/30/2021   Chronic anticoagulation 03/14/2021   Aortic stenosis 03/14/2021   Pressure injury of skin 02/03/2021   Septic shock (HCC)    Acute respiratory failure with hypoxia and hypercapnia (HCC)    Acute pulmonary edema (HCC)    Abnormal CT of the chest 10/23/2020   History of tobacco use 10/23/2020   Chronic knee pain 03/19/2018   Chronic postoperative pain 03/19/2018   Elevated transaminase level    Sleep apnea 10/01/2017   BPH (benign prostatic hyperplasia) 02/04/2017   Urinary tract infection 02/04/2017   Cellulitis of left lower extremity 02/03/2017   GERD (gastroesophageal reflux disease) 02/03/2017   AF (paroxysmal atrial fibrillation) ([place]) 02/03/2017   Sepsis due to cellulitis ([place]) 02/03/2017   New onset atrial fibrillation ([place]) 08/24/2015   Diuresis excessive 08/22/2015  Bladder outlet obstruction 08/22/2015   Hydronephrosis determined by ultrasound 08/22/2015   AKI (acute kidney injury) ([place]) 08/22/2015   Hypernatremia 08/22/2015   Acute blood loss anemia 08/22/2015   Hematuria 08/22/2015   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   Peripheral edema 08/31/2012   Urinary frequency 08/31/2012   Diverticulosis 03/23/2012   Impaired fasting glucose 02/06/2012   Decreased libido 03/07/2011   Erectile dysfunction 03/07/2011   Essential hypertension, benign 12/20/2010   Mixed hyperlipidemia 12/20/2010   ALLERGIC RHINITIS, SEASONAL 05/02/2010   SCIATICA 05/02/2010   HIATAL HERNIA, HX OF 05/02/2010    ONSET DATE: 01/30/2022- approximately  REFERRING DIAG: non-healing  wound   THERAPY DIAG:  Leg wound, right, sequela  Localized edema  Rationale for Evaluation and Treatment Rehabilitation     Wound Therapy - 02/13/22 0001     Subjective Pt states that he has used compression garments and a pump for years.  He has periodic wounds on his legs.  He noted that his legs were swelling more and when he took his compression garment off he noted drainage.  He knows the wound has been there for two weeks but due to the wound being on the back of his leg it may have been there longer.  (Pt states that his compression garments come from the [place] and are custom fit.)    Patient and Family Stated Goals wound to heal    Date of Onset 01/30/22    Prior Treatments self care    Pain Scale 0-10    Pain Score 3     Pain Type Acute pain    Pain Location Leg    Pain Onset With Activity    Evaluation and Treatment Procedures Explained to Patient/Family Yes    Evaluation and Treatment Procedures agreed to    Wound Properties Date First Assessed: 02/13/22 Time First Assessed: 0945 Wound Type: Other (Comment) , pt most likely has lymphedema  Location: Leg Location Orientation: Right;Posterior Present on Admission: Yes   Dressing Type None    Dressing Changed Changed    Dressing Change Frequency PRN    Site / Wound Assessment Clean;Granulation tissue;Yellow    % Wound base Red or Granulating 50%    % Wound base Yellow/Fibrinous Exudate 50%    Peri-wound Assessment Edema;Induration    Wound Length (cm) 3 cm    Wound Width (cm) 2.5 cm   wound is in a U shape with sides width 1cm,   Wound Surface Area (cm^2) 7.5 cm^2    Drainage Amount Minimal    Treatment Cleansed               PATIENT EDUCATION: Education details: Not to use peroxide on the wound, Keep dressing dry, if increased pain take one layer of the dressing off at a time.  Person educated: Patient Education method: Explanation Education comprehension: verbalized understanding   HOME EXERCISE  PROGRAM: Ankle pumps, LAQ, hip ab/adduction, marching     GOALS: Goals reviewed with patient? No  SHORT TERM GOALS: Target date: 03/06/2022  PT wound to be 100% granulated to decrease risk of infection.  Baseline: Goal status: INITIAL  2.  Scant drainage and Edema to be decreased to allow pt to don his compression garment. Baseline:  Goal status: INITIAL    LONG TERM GOALS: Target date: 03/27/2022  Wound to be healed to allow pt to bath/ dress normally. Baseline:  Goal status: INITIAL  ASSESSMENT:  CLINICAL IMPRESSION: Patient is a 76 y.o. male who was  seen today for physical therapy evaluation and treatment for a non-healing wound on the posterior aspect of his right leg.  The pt has had a compression pump and custom made compression garments for years.  His legs began to swell and then he had a blister on the posterior aspect of his leg which turned into a wound.  Mr. Tubby will benefit from skilled PT to create a healing environment for his wound.    OBJECTIVE IMPAIRMENTS increased edema, pain, and decreased skin integrity .   ACTIVITY LIMITATIONS bathing and dressing   PERSONAL FACTORS 1-2 comorbidities: obesity and most likely lymphedema  are also affecting patient's functional outcome.   REHAB POTENTIAL: Good  CLINICAL DECISION MAKING: Stable/uncomplicated  EVALUATION COMPLEXITY: Moderate  PLAN: PT FREQUENCY: 1x/week per pt request as he can not afford more than this.   PT DURATION: 6 weeks  PLANNED INTERVENTIONS: Therapeutic exercises, Patient/Family education, Self Care, and wound care.   PLAN FOR NEXT SESSION: Continue with wound  care, retro massage to decrease induration.  Measure each visit as pt is only coming once a week.    Rayetta Humphrey, PT CLT [phone]  02/13/2022, 1:18 PM

## 2022-02-20 ENCOUNTER — Ambulatory Visit (HOSPITAL_COMMUNITY): Payer: Medicare HMO | Attending: Internal Medicine | Admitting: Physical Therapy

## 2022-02-20 DIAGNOSIS — S81801S Unspecified open wound, right lower leg, sequela: Secondary | ICD-10-CM | POA: Diagnosis not present

## 2022-02-20 DIAGNOSIS — R600 Localized edema: Secondary | ICD-10-CM | POA: Insufficient documentation

## 2022-02-20 NOTE — Therapy (Signed)
OUTPATIENT PHYSICAL THERAPY NEURO EVALUATION   Patient Name: Gary Carroll MRN: [id number] DOB:Oct 23, 1945, 76 y.o., male Today's Date: 02/20/2022 PHYSICAL THERAPY DISCHARGE SUMMARY  Visits from Start of Care: 2  Current functional level related to goals / functional outcomes: Wound has healed  Remaining deficits: None    Education / Equipment: Pt needs compression daily    Patient agrees to discharge. Patient goals were met. Patient is being discharged due to meeting the stated rehab goals.   PCP: Dr. Allyn Kenner REFERRING PROVIDER: Cecile Sheerer   PT End of Session - 02/20/22 1118     Visit Number 2    Number of Visits 2   Pt only able to come once a week due to co-pay   Date for PT Re-Evaluation 03/27/22    Authorization Type AETNA no limit    Progress Note Due on Visit 2    PT Start Time 1040    PT Stop Time 1105    PT Time Calculation (min) 25 min    Activity Tolerance Patient tolerated treatment well    Behavior During Therapy [facility] for tasks assessed/performed             Past Medical History:  Diagnosis Date   Allergic rhinitis    Arthritis    Asthma    seasonal per pt   Bilateral knee pain    OA   Diverticulosis 02/2012   seen on colonoscopy   Erectile dysfunction    GERD (gastroesophageal reflux disease)    Heart murmur    Hiatal hernia    HLD (hyperlipidemia)    HTN (hypertension)    Hydronephrosis determined by ultrasound 08/22/2015   Overactive bladder    Prediabetes    Pulmonary lesion    Ringing of ears    Sleep apnea    Urinary frequency    Urinary urgency    Wears glasses    Past Surgical History:  Procedure Laterality Date   CATARACT EXTRACTION W/PHACO Left 12/07/2021   Procedure: CATARACT EXTRACTION PHACO AND INTRAOCULAR LENS PLACEMENT ([place]);  Surgeon: Baruch Goldmann, MD;  Location: [facility];  Service: Ophthalmology;  Laterality: Left;  CDE 7.75   CATARACT EXTRACTION W/PHACO Right 12/21/2021   Procedure: CATARACT EXTRACTION PHACO  AND INTRAOCULAR LENS PLACEMENT (IOC);  Surgeon: Baruch Goldmann, MD;  Location: [facility];  Service: Ophthalmology;  Laterality: Right;  CDE: 8.00   CHOLECYSTECTOMY     COLONOSCOPY  02/2012   Dr. 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 Lorre Nick)   TOTAL KNEE ARTHROPLASTY Right 06/19/2015   Procedure: TOTAL KNEE ARTHROPLASTY;  Surgeon: Vickey Huger, MD;  Location: [place];  Service: Orthopedics;  Laterality: Right;   Patient Active Problem List   Diagnosis Date Noted   Obstructive lung disease (generalized) ([place]) 11/29/2021   Restrictive lung disease 06/26/2021   Lung nodules 04/30/2021   Chronic anticoagulation 03/14/2021   Aortic stenosis 03/14/2021   Pressure injury of skin 02/03/2021   Septic shock (HCC)    Acute respiratory failure with hypoxia and hypercapnia (HCC)    Acute pulmonary edema (HCC)    Abnormal CT of the chest 10/23/2020   History of tobacco use 10/23/2020   Chronic knee pain 03/19/2018   Chronic postoperative pain 03/19/2018   Elevated transaminase level  Sleep apnea 10/01/2017   BPH (benign prostatic hyperplasia) 02/04/2017   Urinary tract infection 02/04/2017   Cellulitis of left lower extremity 02/03/2017   GERD (gastroesophageal reflux disease) 02/03/2017   AF (paroxysmal atrial fibrillation) ([place]) 02/03/2017   Sepsis due to cellulitis ([place]) 02/03/2017   New onset atrial fibrillation ([place]) 08/24/2015   Diuresis excessive 08/22/2015   Bladder outlet obstruction 08/22/2015   Hydronephrosis determined by ultrasound 08/22/2015   AKI (acute kidney injury) ([place]) 08/22/2015   Hypernatremia 08/22/2015   Acute blood loss anemia 08/22/2015   Hematuria 08/22/2015   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   Peripheral edema 08/31/2012   Urinary frequency 08/31/2012   Diverticulosis  03/23/2012   Impaired fasting glucose 02/06/2012   Decreased libido 03/07/2011   Erectile dysfunction 03/07/2011   Essential hypertension, benign 12/20/2010   Mixed hyperlipidemia 12/20/2010   ALLERGIC RHINITIS, SEASONAL 05/02/2010   SCIATICA 05/02/2010   HIATAL HERNIA, HX OF 05/02/2010    ONSET DATE: 01/30/2022- approximately  REFERRING DIAG: non-healing wound   THERAPY DIAG:  Leg wound, right, sequela  Localized edema  Rationale for Evaluation and Treatment Rehabilitation     Wound Therapy - 02/20/22 0001     Subjective Pt states that his leg has been hurting a little but not bad. Pt has been doing the exercises that were given to him.    Patient and Family Stated Goals wound to heal    Date of Onset 01/30/22    Prior Treatments self care    Pain Scale 0-10    Pain Score 2     Pain Type Acute pain    Pain Location Leg    Pain Onset Unable to tell    Evaluation and Treatment Procedures Explained to Patient/Family Yes    Evaluation and Treatment Procedures agreed to    Wound Properties Date First Assessed: 02/13/22 Time First Assessed: 0945 Wound Type: Other (Comment) , pt most likely has lymphedema  Location: Leg Location Orientation: Right;Posterior Present on Admission: Yes   Dressing Type Compression wrap    Dressing Changed Other (Comment)    Dressing Change Frequency PRN    Site / Wound Assessment Clean;Granulation tissue;Yellow;Other (Comment)   wound is healed   Peri-wound Assessment Edema;Induration    Treatment Cleansed;Other (Comment)             Pt measured calf of RT LE 44.3 cm which pt states is normal for him;  ankle was at 29.7 cm; pt is ready to go back into his compression garment.   PATIENT EDUCATION: Education details: Pt sates that prior to donning compression garment he was wiping his leg down with alcohol and then putting vaseline on his legs.  Therapist advised not to use Alcohol on his legs and to moisturize in the evening right after he  takes the compression garments off and showers.   Education method: Explanation Education comprehension: verbalized understanding   HOME EXERCISE PROGRAM: Continue with previous   GOALS: Goals reviewed with patient? No  SHORT TERM GOALS: Target date: 03/13/2022  PT wound to be 100% granulated to decrease risk of infection.  Baseline: Goal status: MET  2.  Scant drainage and Edema to be decreased to allow pt to don his compression garment. Baseline:  Goal status: MET    LONG TERM GOALS: Target date: 04/03/2022  Wound to be healed to allow pt to bath/ dress normally. Baseline:  Goal status: MET  ASSESSMENT:  CLINICAL IMPRESSION: Once compression bandaging has been removed it is noted  that the pt wound has healed.  Pt is ready for discharged.  Therapist urged daily donning of compression garment and daily pumping.    OBJECTIVE IMPAIRMENTS increased edema, pain, and decreased skin integrity .   ACTIVITY LIMITATIONS bathing and dressing   PERSONAL FACTORS 1-2 comorbidities: obesity and most likely lymphedema  are also affecting patient's functional outcome.   REHAB POTENTIAL: Good  CLINICAL DECISION MAKING: Stable/uncomplicated  EVALUATION COMPLEXITY: Moderate  PLAN: PT FREQUENCY: 1x/week per pt request as he can not afford more than this.   PT DURATION: 6 weeks  PLANNED INTERVENTIONS: Therapeutic exercises, Patient/Family education, Self Care, and wound care.   PLAN FOR NEXT SESSION:  Discharge as wound has healed Rayetta Humphrey, PT CLT [phone]  02/20/2022, 11:22 AM

## 2022-02-27 ENCOUNTER — Ambulatory Visit (HOSPITAL_COMMUNITY): Payer: No Typology Code available for payment source | Admitting: Physical Therapy

## 2022-03-01 ENCOUNTER — Ambulatory Visit (HOSPITAL_COMMUNITY): Payer: No Typology Code available for payment source

## 2022-03-05 ENCOUNTER — Ambulatory Visit (HOSPITAL_COMMUNITY): Payer: No Typology Code available for payment source

## 2022-03-07 ENCOUNTER — Ambulatory Visit (HOSPITAL_COMMUNITY): Payer: No Typology Code available for payment source

## 2022-03-12 ENCOUNTER — Ambulatory Visit (HOSPITAL_COMMUNITY): Payer: No Typology Code available for payment source

## 2022-03-14 ENCOUNTER — Ambulatory Visit (HOSPITAL_COMMUNITY): Payer: No Typology Code available for payment source | Admitting: Physical Therapy

## 2022-03-19 ENCOUNTER — Ambulatory Visit (HOSPITAL_COMMUNITY): Payer: No Typology Code available for payment source | Admitting: Physical Therapy

## 2022-03-21 ENCOUNTER — Ambulatory Visit (HOSPITAL_COMMUNITY): Payer: No Typology Code available for payment source | Admitting: Physical Therapy

## 2022-04-03 DIAGNOSIS — L8990 Pressure ulcer of unspecified site, unspecified stage: Secondary | ICD-10-CM | POA: Diagnosis not present

## 2022-04-23 DIAGNOSIS — E1169 Type 2 diabetes mellitus with other specified complication: Secondary | ICD-10-CM | POA: Diagnosis not present

## 2022-04-23 DIAGNOSIS — E782 Mixed hyperlipidemia: Secondary | ICD-10-CM | POA: Diagnosis not present

## 2022-04-29 DIAGNOSIS — I5042 Chronic combined systolic (congestive) and diastolic (congestive) heart failure: Secondary | ICD-10-CM | POA: Diagnosis not present

## 2022-04-29 DIAGNOSIS — E138 Other specified diabetes mellitus with unspecified complications: Secondary | ICD-10-CM | POA: Insufficient documentation

## 2022-04-29 DIAGNOSIS — I2510 Atherosclerotic heart disease of native coronary artery without angina pectoris: Secondary | ICD-10-CM | POA: Diagnosis not present

## 2022-04-29 DIAGNOSIS — N401 Enlarged prostate with lower urinary tract symptoms: Secondary | ICD-10-CM | POA: Diagnosis not present

## 2022-04-29 DIAGNOSIS — R252 Cramp and spasm: Secondary | ICD-10-CM | POA: Insufficient documentation

## 2022-04-29 DIAGNOSIS — R339 Retention of urine, unspecified: Secondary | ICD-10-CM | POA: Diagnosis not present

## 2022-04-29 DIAGNOSIS — G4730 Sleep apnea, unspecified: Secondary | ICD-10-CM | POA: Diagnosis not present

## 2022-04-29 DIAGNOSIS — E1169 Type 2 diabetes mellitus with other specified complication: Secondary | ICD-10-CM | POA: Diagnosis not present

## 2022-04-29 DIAGNOSIS — I10 Essential (primary) hypertension: Secondary | ICD-10-CM | POA: Diagnosis not present

## 2022-04-29 DIAGNOSIS — M810 Age-related osteoporosis without current pathological fracture: Secondary | ICD-10-CM | POA: Diagnosis not present

## 2022-04-29 DIAGNOSIS — R9389 Abnormal findings on diagnostic imaging of other specified body structures: Secondary | ICD-10-CM | POA: Diagnosis not present

## 2022-04-29 DIAGNOSIS — J449 Chronic obstructive pulmonary disease, unspecified: Secondary | ICD-10-CM | POA: Insufficient documentation

## 2022-04-29 DIAGNOSIS — I700 Atherosclerosis of aorta: Secondary | ICD-10-CM | POA: Insufficient documentation

## 2022-04-29 DIAGNOSIS — E118 Type 2 diabetes mellitus with unspecified complications: Secondary | ICD-10-CM | POA: Insufficient documentation

## 2022-04-29 DIAGNOSIS — E782 Mixed hyperlipidemia: Secondary | ICD-10-CM | POA: Diagnosis not present

## 2022-04-29 DIAGNOSIS — I480 Paroxysmal atrial fibrillation: Secondary | ICD-10-CM | POA: Diagnosis not present

## 2022-05-01 DIAGNOSIS — B3749 Other urogenital candidiasis: Secondary | ICD-10-CM | POA: Diagnosis not present

## 2022-05-01 DIAGNOSIS — N342 Other urethritis: Secondary | ICD-10-CM | POA: Insufficient documentation

## 2022-05-01 DIAGNOSIS — B3742 Candidal balanitis: Secondary | ICD-10-CM | POA: Insufficient documentation

## 2022-05-27 DIAGNOSIS — L304 Erythema intertrigo: Secondary | ICD-10-CM | POA: Diagnosis not present

## 2022-05-27 DIAGNOSIS — B356 Tinea cruris: Secondary | ICD-10-CM | POA: Diagnosis not present

## 2022-06-03 ENCOUNTER — Encounter: Payer: Self-pay | Admitting: Nurse Practitioner

## 2022-06-03 ENCOUNTER — Ambulatory Visit: Payer: No Typology Code available for payment source | Admitting: Nurse Practitioner

## 2022-06-03 VITALS — BP 126/84 | HR 53 | Ht 72.0 in | Wt 274.6 lb

## 2022-06-03 DIAGNOSIS — J189 Pneumonia, unspecified organism: Secondary | ICD-10-CM | POA: Diagnosis not present

## 2022-06-03 DIAGNOSIS — G4730 Sleep apnea, unspecified: Secondary | ICD-10-CM | POA: Diagnosis not present

## 2022-06-03 DIAGNOSIS — J449 Chronic obstructive pulmonary disease, unspecified: Secondary | ICD-10-CM | POA: Diagnosis not present

## 2022-06-03 DIAGNOSIS — J432 Centrilobular emphysema: Secondary | ICD-10-CM | POA: Diagnosis not present

## 2022-06-03 DIAGNOSIS — R918 Other nonspecific abnormal finding of lung field: Secondary | ICD-10-CM

## 2022-06-03 DIAGNOSIS — I480 Paroxysmal atrial fibrillation: Secondary | ICD-10-CM

## 2022-06-03 MED ORDER — PREDNISONE 20 MG PO TABS: 40.0000 mg | ORAL_TABLET | Freq: Every day | ORAL | 0 refills | Status: AC

## 2022-06-03 MED ORDER — AMOXICILLIN-POT CLAVULANATE 875-125 MG PO TABS: 1.0000 | ORAL_TABLET | Freq: Two times a day (BID) | ORAL | 0 refills | Status: AC

## 2022-06-03 NOTE — Patient Instructions (Addendum)
Start Spiriva 2 puffs daily Augmentin 1 tab Twice daily for 7 days. Take with food. Take a daily probiotic or eat yogurt while on your antibiotics  Prednisone 40 mg daily for 5 days. Take in AM with food Guaifenesin 600 mg Twice daily for chest congestion/cough  Continue loratadine 1 tab daily for allergies  Repeat Super D CT chest in 7 weeks   Follow up with Dr. Lamonte Sakai after CT chest in 8 weeks. If symptoms do not improve or worsen, please contact office for sooner follow up or seek emergency care.

## 2022-06-03 NOTE — Assessment & Plan Note (Signed)
Rate controlled. Anticoagulated with Eliquis. Follow up with cardiology as scheduled.

## 2022-06-03 NOTE — Assessment & Plan Note (Signed)
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 We will treat him with steroid burst. He has a low symptom burden at baseline. Current symptoms seem to be related to acute process. Start on LAMA therapy with Spiriva - provided with samples today. Medication education and teachback performed. Action plan in place.

## 2022-06-03 NOTE — Assessment & Plan Note (Signed)
3 cm LUL nodule; suspected to be infectious or inflammatory given interval development. He does have a history of waxing and waning nodules on imaging. Previous PET scans with mild hypermetabolism and favored to represent inflammatory process. Given his acute symptoms today, we will treat him for infectious process with empiric augmentin course. Target mucociliary clearance therapies. Plan to repeat imaging 6 weeks after completion.  Patient Instructions  Start Spiriva 2 puffs daily Augmentin 1 tab Twice daily for 7 days. Take with food. Take a daily probiotic or eat yogurt while on your antibiotics  Prednisone 40 mg daily for 5 days. Take in AM with food Guaifenesin 600 mg Twice daily for chest congestion/cough  Continue loratadine 1 tab daily for allergies  Repeat Super D CT chest in 7 weeks   Follow up with Dr. Lamonte Sakai after CT chest in 8 weeks. If symptoms do not improve or worsen, please contact office for sooner follow up or seek emergency care.

## 2022-06-03 NOTE — Progress Notes (Unsigned)
$'@Patient'R$  ID: Gary Carroll, male    DOB: Sep 24, 1945, 77 y.o.   MRN: [id number]  Chief Complaint  Patient presents with   Follow-up    Pt f/u after recent CT scan, pt reports he hasn't been treated w/ antibiotics in the last 53mo. He reports he breathing is okay, home oximeter avgs 91-93%. Has some wheezing in the last 2 months mainly when bending over.    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 He is a patient of Dr. BAgustina Caroliand last seen in office 11/29/2021. Past medical history significant for OSA on CPAP, HTN, a fib, AS, allergic rhinitis, GERD, HLD.   TEST/EVENTS:  10/09/2021 Super D CT chest: atherosclerosis. Mediastinal lymph nodes decreased in size. Multifocal left apical nodularity seen previously has improved. Largest remaining nodules measure 1.6 x 0.6 cm. Underlying emphysema.  05/03/2022 LDCT chest: LUL 30 mm nodule, felt to be infectious. Left apex adjacent nodules measuring 7 mm and 9 mm, stable. New 3 mm LUL nodule. Increase branching nodular consolidative opacity LLL. Mild emphysema. Atherosclerosis. Cardiomegaly.   11/29/2021: OV with Dr. BLamonte Sakai Pulmonary nodules are smaller; this along with mild hypermetabolism on PET consistent with indolent inflammatory process, question opportunistic infection like MAIC. Asymptomatic. Unclear that this needs dedicated follow up but would not be unreasonable to repat CT to correlate with any evolving symptoms. If so, can repeat in 6 months. Restrictive lung disease likely due to obesity. Likely has some subclinical COPD. Not currently on bronchodilators. Good functional capacity. Hold off on starting BD for now.   06/03/2022: Today - follow up Patient presents today for follow up. [facility]. Upon review of his chart, there is a new 30 mm LUL nodule, felt  to be infectious or inflammatory, on CT from 05/03/2022. He was told he needed to see Dr. BLamonte Sakaiagain to discuss next steps. He was not treated with any abx or steroids. He tells me today that he has had an increased cough over the past few weeks. Primarily dry. Does have a little more chest congestion. He's also felt a little more short winded over the past two months, mostly with bending over or climbing stairs/uphill. Denies wheezing, fevers, chills, hemoptysis, leg swelling. Currently not on any maintenance inhalers. He wears CPAP nightly.   Allergies  Allergen Reactions   Diovan [Valsartan] Cough   Tagamet [Cimetidine] Other (See Comments)    irrittability    Immunization History  Administered Date(s) Administered   Fluad Quad(high Dose 65+) 01/25/2019, 01/22/2021   Influenza Split 02/11/2011, 02/01/2012   Influenza, High Dose Seasonal PF 01/27/2015, 01/19/2016, 01/27/2017   Influenza-Unspecified 01/18/2018, 02/06/2018, 03/21/2019, 01/19/2020, 01/25/2021   Moderna Covid-19 Vaccine Bivalent Booster 78yr& up 01/25/2021   Moderna SARS-COV2 Booster Vaccination 01/22/2021   Moderna Sars-Cov-2 Peds vaccine [id number] 11y82yr/21/2023   Moderna Sars-Covid-2 Vaccination 06/12/2019, 07/12/2019, 03/13/2020, 08/29/2020   Pneumococcal Conjugate-13 01/27/2015   Pneumococcal Polysaccharide-23 02/06/2012, 03/25/2017   Tdap 08/05/2011, 06/15/2021   Tetanus 05/20/2009   Zoster Recombinat (Shingrix) 09/13/2019, 12/14/2019   Zoster, Live 05/23/2012    Past Medical History:  Diagnosis Date   Allergic rhinitis    Arthritis    Asthma    seasonal per pt   Bilateral knee pain    OA   Diverticulosis 02/2012   seen on colonoscopy  Erectile dysfunction    GERD (gastroesophageal reflux disease)    Heart murmur    Hiatal hernia    HLD (hyperlipidemia)    HTN (hypertension)    Hydronephrosis determined by ultrasound 08/22/2015   Overactive bladder    Prediabetes    Pulmonary lesion    Ringing  of ears    Sleep apnea    Urinary frequency    Urinary urgency    Wears glasses     Tobacco History: Social History   Tobacco Use  Smoking Status Former   Packs/day: 1.00   Types: Cigarettes   Quit date: 01/18/2010   Years since quitting: 12.3  Smokeless Tobacco Never  Tobacco Comments   Smoked for 25 years- quit September 2011.  Passive exposure from wife   Counseling given: Not Answered Tobacco comments: Smoked for 25 years- quit September 2011.  Passive exposure from wife   Outpatient Medications Prior to Visit  Medication Sig Dispense Refill   apixaban (ELIQUIS) 5 MG TABS tablet Take 1 tablet (5 mg total) by mouth 2 (two) times daily. 60 tablet 2   Calcium Carb-Cholecalciferol (CALCIUM 500 +D PO) Take by mouth.     diphenhydrAMINE (BENADRYL) 25 MG tablet Take 50 mg by mouth at bedtime as needed for sleep.     furosemide (LASIX) 40 MG tablet Take 40 mg by mouth daily.     loratadine (CLARITIN) 10 MG tablet Take 10 mg by mouth daily as needed.     losartan (COZAAR) 50 MG tablet Take 50 mg by mouth daily.     MAGNESIUM PO Take by mouth.     Melatonin 5 MG TABS Take 10 mg by mouth at bedtime.      metoprolol tartrate (LOPRESSOR) 25 MG tablet Take 1 tablet (25 mg total) by mouth 2 (two) times daily. 60 tablet 2   Multiple Vitamins-Minerals (MULTIVITAL) tablet Take 1 tablet by mouth daily.     Omega-3 Fatty Acids (FISH OIL) 1000 MG CAPS Take 4 capsules by mouth daily.      oxybutynin (DITROPAN) 5 MG tablet Take 5 mg by mouth daily.     potassium chloride (K-DUR) 10 MEQ tablet Take 1 tablet (10 mEq total) by mouth daily. 30 tablet 0   promethazine-dextromethorphan (PROMETHAZINE-DM) 6.25-15 MG/5ML syrup Take 5 mLs by mouth 3 (three) times daily as needed for cough. 140 mL 0   rosuvastatin (CRESTOR) 20 MG tablet Take 20 mg by mouth daily.     terbinafine (LAMISIL) 1 % cream APPLY MODERATE AMOUNT TO AFFECTED AREA TWICE A DAY     UNKNOWN TO PATIENT "2 new ones: one for indigestion  and one to shrink my prostate"     vitamin C (ASCORBIC ACID) 500 MG tablet Take 500 mg by mouth daily.     No facility-administered medications prior to visit.     Review of Systems:   Constitutional: No weight loss or gain, night sweats, fevers, chills, fatigue, or lassitude. HEENT: No headaches, difficulty swallowing, tooth/dental problems, or sore throat. No sneezing, itching, ear ache, nasal congestion, or post nasal drip CV:  No chest pain, orthopnea, PND, swelling in lower extremities, anasarca, dizziness, palpitations, syncope Resp: +shortness of breath with exertion and bending over; cough; chest congestion. No excess mucus or change in color of mucus. No hemoptysis. No wheezing.  No chest wall deformity GI:  No heartburn, indigestion, abdominal pain, nausea, vomiting, diarrhea, loss of appetite GU: No dysuria, change in color of urine, urgency or frequency Skin: No rash,  lesions, ulcerations MSK:  No joint pain or swelling.   Neuro: No dizziness or lightheadedness.  Psych: No depression or anxiety. Mood stable.     Physical Exam:  BP 126/84   Pulse (!) 53   Ht 6' (1.829 m)   Wt 274 lb 9.6 oz (124.6 kg)   SpO2 96%   BMI 37.24 kg/m   GEN: Pleasant, interactive, well-appearing; obese; in no acute distress HEENT:  Normocephalic and atraumatic. PERRLA. Sclera white. Nasal turbinates pink, moist and patent bilaterally. No rhinorrhea present. Oropharynx pink and moist, without exudate or edema. No lesions, ulcerations, or postnasal drip.  NECK:  Supple w/ fair ROM. No JVD present. Normal carotid impulses w/o bruits. Thyroid symmetrical with no goiter or nodules palpated. No lymphadenopathy.   CV: Irregular rhythm, rate controlled; systolic murmur, no peripheral edema. Pulses intact, +2 bilaterally. No cyanosis, pallor or clubbing. PULMONARY:  Unlabored, regular breathing. Minimal scattered rhonchi bilaterally A&P. No accessory muscle use.  GI: BS present and normoactive. Soft,  non-tender to palpation. No organomegaly or masses detected.  MSK: No erythema, warmth or tenderness. Cap refil <2 sec all extrem. No deformities or joint swelling noted.  Neuro: A/Ox3. No focal deficits noted.   Skin: Warm, no lesions or rashe Psych: Normal affect and behavior. Judgement and thought content appropriate.     Lab Results:  CBC    Component Value Date/Time   WBC 9.4 04/30/2021 1142   RBC 4.68 04/30/2021 1142   HGB 13.5 04/30/2021 1142   HCT 41.1 04/30/2021 1142   PLT 227.0 04/30/2021 1142   MCV 87.8 04/30/2021 1142   MCH 28.6 02/09/2021 0248   MCHC 32.9 04/30/2021 1142   RDW 16.3 (H) 04/30/2021 1142   LYMPHSABS 2.2 04/30/2021 1142   MONOABS 0.9 04/30/2021 1142   EOSABS 0.2 04/30/2021 1142   BASOSABS 0.0 04/30/2021 1142    BMET    Component Value Date/Time   NA 137 02/15/2021 0831   K 4.1 02/15/2021 0831   CL 107 02/15/2021 0831   CO2 22 02/15/2021 0831   GLUCOSE 175 (H) 02/15/2021 0831   BUN 22 02/15/2021 0831   CREATININE 0.92 02/15/2021 0831   CREATININE 0.85 02/06/2012 0929   CALCIUM 9.1 02/15/2021 0831   GFRNONAA >60 02/15/2021 0831   GFRAA >60 10/09/2017 0506    BNP    Component Value Date/Time   BNP 837.1 (H) 02/03/2021 3419     Imaging:  No results found.       Latest Ref Rng & Units 06/26/2021   10:36 AM  PFT Results  FVC-Pre L 3.26   FVC-Predicted Pre % 71   FVC-Post L 3.00   FVC-Predicted Post % 66   Pre FEV1/FVC % % 80   Post FEV1/FCV % % 83   FEV1-Pre L 2.61   FEV1-Predicted Pre % 79   FEV1-Post L 2.49   DLCO uncorrected ml/min/mmHg 24.72   DLCO UNC% % 93   DLCO corrected ml/min/mmHg 24.72   DLCO COR %Predicted % 93   DLVA Predicted % 147   TLC L 5.17   TLC % Predicted % 69   RV % Predicted % 55     No results found for: "NITRICOXIDE"      Assessment & Plan:   CAP (community acquired pneumonia) 3 cm LUL nodule; suspected to be infectious or inflammatory given interval development. He does have a history  of waxing and waning nodules on imaging. Previous PET scans with mild hypermetabolism and favored to represent  inflammatory process or chronic indolent infection. Given his acute symptoms today, we will treat him for infectious process with empiric augmentin course. Target mucociliary clearance therapies. Plan to repeat imaging 6 weeks after completion.  Patient Instructions  Start Spiriva 2 puffs daily Augmentin 1 tab Twice daily for 7 days. Take with food. Take a daily probiotic or eat yogurt while on your antibiotics  Prednisone 40 mg daily for 5 days. Take in AM with food Guaifenesin 600 mg Twice daily for chest congestion/cough  Continue loratadine 1 tab daily for allergies  Repeat Super D CT chest in 7 weeks   Follow up with Dr. Lamonte Sakai after CT chest in 8 weeks. If symptoms do not improve or worsen, please contact office for sooner follow up or seek emergency care.    Obstructive lung disease (generalized) ([place]) Possible AECOPD related to infectious process? We will treat him with steroid burst. He has a low symptom burden at baseline. Current symptoms seem to be related to acute process. Start on LAMA therapy with Spiriva - provided with samples today. Medication education and teachback performed. Action plan in place.    Sleep apnea OSA on CPAP. Excellent compliance per his report and continues to receive benefit from use.   AF (paroxysmal atrial fibrillation) (HCC) Rate controlled. Anticoagulated with Eliquis. Follow up with cardiology as scheduled.    I spent 35 minutes of dedicated to the care of this patient on the date of this encounter to include pre-visit review of records, face-to-face time with the patient discussing conditions above, post visit ordering of testing, clinical documentation with the electronic health record, making appropriate referrals as documented, and communicating necessary findings to members of the patients care team.  Clayton Bibles,  NP 06/04/2022  Pt aware and understands NP's role.

## 2022-06-03 NOTE — Assessment & Plan Note (Signed)
OSA on CPAP. Excellent compliance per his report and continues to receive benefit from use.

## 2022-06-04 ENCOUNTER — Encounter: Payer: Self-pay | Admitting: Nurse Practitioner

## 2022-06-13 ENCOUNTER — Other Ambulatory Visit: Payer: No Typology Code available for payment source

## 2022-06-13 DIAGNOSIS — R197 Diarrhea, unspecified: Secondary | ICD-10-CM | POA: Diagnosis not present

## 2022-06-24 DIAGNOSIS — E785 Hyperlipidemia, unspecified: Secondary | ICD-10-CM | POA: Diagnosis not present

## 2022-06-24 DIAGNOSIS — J4489 Other specified chronic obstructive pulmonary disease: Secondary | ICD-10-CM | POA: Diagnosis not present

## 2022-06-24 DIAGNOSIS — I130 Hypertensive heart and chronic kidney disease with heart failure and stage 1 through stage 4 chronic kidney disease, or unspecified chronic kidney disease: Secondary | ICD-10-CM | POA: Diagnosis not present

## 2022-06-24 DIAGNOSIS — E669 Obesity, unspecified: Secondary | ICD-10-CM | POA: Diagnosis not present

## 2022-06-24 DIAGNOSIS — N182 Chronic kidney disease, stage 2 (mild): Secondary | ICD-10-CM | POA: Diagnosis not present

## 2022-06-24 DIAGNOSIS — I4891 Unspecified atrial fibrillation: Secondary | ICD-10-CM | POA: Diagnosis not present

## 2022-06-24 DIAGNOSIS — I509 Heart failure, unspecified: Secondary | ICD-10-CM | POA: Diagnosis not present

## 2022-06-24 DIAGNOSIS — M1990 Unspecified osteoarthritis, unspecified site: Secondary | ICD-10-CM | POA: Diagnosis not present

## 2022-06-24 DIAGNOSIS — L89309 Pressure ulcer of unspecified buttock, unspecified stage: Secondary | ICD-10-CM | POA: Diagnosis not present

## 2022-06-24 DIAGNOSIS — R195 Other fecal abnormalities: Secondary | ICD-10-CM | POA: Insufficient documentation

## 2022-06-24 DIAGNOSIS — Z87891 Personal history of nicotine dependence: Secondary | ICD-10-CM | POA: Diagnosis not present

## 2022-06-24 DIAGNOSIS — K5900 Constipation, unspecified: Secondary | ICD-10-CM | POA: Diagnosis not present

## 2022-06-24 DIAGNOSIS — E1122 Type 2 diabetes mellitus with diabetic chronic kidney disease: Secondary | ICD-10-CM | POA: Diagnosis not present

## 2022-06-24 DIAGNOSIS — E876 Hypokalemia: Secondary | ICD-10-CM | POA: Diagnosis not present

## 2022-06-24 DIAGNOSIS — Z713 Dietary counseling and surveillance: Secondary | ICD-10-CM | POA: Diagnosis not present

## 2022-06-24 DIAGNOSIS — K219 Gastro-esophageal reflux disease without esophagitis: Secondary | ICD-10-CM | POA: Diagnosis not present

## 2022-06-24 DIAGNOSIS — K5909 Other constipation: Secondary | ICD-10-CM | POA: Insufficient documentation

## 2022-06-24 DIAGNOSIS — N3941 Urge incontinence: Secondary | ICD-10-CM | POA: Diagnosis not present

## 2022-06-24 DIAGNOSIS — Z6836 Body mass index (BMI) 36.0-36.9, adult: Secondary | ICD-10-CM | POA: Diagnosis not present

## 2022-07-25 ENCOUNTER — Ambulatory Visit
Admission: RE | Admit: 2022-07-25 | Discharge: 2022-07-25 | Disposition: A | Discharge status: Home / Self Care | Payer: No Typology Code available for payment source | Source: Ambulatory Visit | Attending: Nurse Practitioner | Admitting: Nurse Practitioner

## 2022-07-25 DIAGNOSIS — R918 Other nonspecific abnormal finding of lung field: Secondary | ICD-10-CM

## 2022-07-25 DIAGNOSIS — J189 Pneumonia, unspecified organism: Secondary | ICD-10-CM

## 2022-08-23 DIAGNOSIS — E1169 Type 2 diabetes mellitus with other specified complication: Secondary | ICD-10-CM | POA: Diagnosis not present

## 2022-08-23 DIAGNOSIS — E782 Mixed hyperlipidemia: Secondary | ICD-10-CM | POA: Diagnosis not present

## 2022-08-28 DIAGNOSIS — Z0000 Encounter for general adult medical examination without abnormal findings: Secondary | ICD-10-CM | POA: Diagnosis not present

## 2022-08-29 DIAGNOSIS — R339 Retention of urine, unspecified: Secondary | ICD-10-CM | POA: Diagnosis not present

## 2022-08-29 DIAGNOSIS — E1169 Type 2 diabetes mellitus with other specified complication: Secondary | ICD-10-CM | POA: Diagnosis not present

## 2022-08-29 DIAGNOSIS — I2510 Atherosclerotic heart disease of native coronary artery without angina pectoris: Secondary | ICD-10-CM | POA: Diagnosis not present

## 2022-08-29 DIAGNOSIS — I5042 Chronic combined systolic (congestive) and diastolic (congestive) heart failure: Secondary | ICD-10-CM | POA: Diagnosis not present

## 2022-08-29 DIAGNOSIS — R918 Other nonspecific abnormal finding of lung field: Secondary | ICD-10-CM | POA: Diagnosis not present

## 2022-08-29 DIAGNOSIS — E782 Mixed hyperlipidemia: Secondary | ICD-10-CM | POA: Diagnosis not present

## 2022-08-29 DIAGNOSIS — M810 Age-related osteoporosis without current pathological fracture: Secondary | ICD-10-CM | POA: Diagnosis not present

## 2022-08-29 DIAGNOSIS — K649 Unspecified hemorrhoids: Secondary | ICD-10-CM | POA: Insufficient documentation

## 2022-08-29 DIAGNOSIS — I480 Paroxysmal atrial fibrillation: Secondary | ICD-10-CM | POA: Diagnosis not present

## 2022-08-29 DIAGNOSIS — I110 Hypertensive heart disease with heart failure: Secondary | ICD-10-CM | POA: Diagnosis not present

## 2022-08-29 DIAGNOSIS — N401 Enlarged prostate with lower urinary tract symptoms: Secondary | ICD-10-CM | POA: Diagnosis not present

## 2022-08-29 DIAGNOSIS — I10 Essential (primary) hypertension: Secondary | ICD-10-CM | POA: Diagnosis not present

## 2022-08-29 DIAGNOSIS — G4730 Sleep apnea, unspecified: Secondary | ICD-10-CM | POA: Diagnosis not present

## 2022-08-30 ENCOUNTER — Encounter: Payer: Self-pay | Admitting: Emergency Medicine

## 2022-08-30 ENCOUNTER — Ambulatory Visit (INDEPENDENT_AMBULATORY_CARE_PROVIDER_SITE_OTHER): Payer: No Typology Code available for payment source | Admitting: Emergency Medicine

## 2022-08-30 VITALS — BP 136/72 | HR 61 | Temp 97.5°F | Ht 72.0 in | Wt 271.6 lb

## 2022-08-30 DIAGNOSIS — G4730 Sleep apnea, unspecified: Secondary | ICD-10-CM

## 2022-08-30 DIAGNOSIS — R9389 Abnormal findings on diagnostic imaging of other specified body structures: Secondary | ICD-10-CM | POA: Diagnosis not present

## 2022-08-30 DIAGNOSIS — J449 Chronic obstructive pulmonary disease, unspecified: Secondary | ICD-10-CM | POA: Diagnosis not present

## 2022-08-30 NOTE — Progress Notes (Signed)
Subjective:    Patient ID: Gary Carroll, male    DOB: July 02, 1945, 77 y.o.   MRN: [id number]  HPI   ROV 08/30/2022 --Mr. Gary Carroll follows up today.  He is 69 with history of tobacco use and mixed obstruction and restriction on PFT, OSA on CPAP, hypertension, A-fib, aortic stenosis and allergic rhinitis.  I seen him for his COPD, some waxing and waning pulmonary nodular disease that we have followed with serial imaging.  He had a lung cancer screening CT at the [facility] 05/03/2022 that apparently showed a new 3 cm left upper lobe nodule felt to be either infectious or inflammatory.  He was seen here in our office in January and treated with Augmentin and prednisone.  CT chest was repeated 07/25/2022 in our system as below.  He was started on Spiriva last visit, unsure whether it helped him much. He can get exertional SOB, but he still stays active, swims a few times a week. Good compliance w his CPAP.   Super D CT chest 07/25/2022 reviewed by me, shows increased peribronchovascular nodularity and consolidation in the apical posterior left upper lobe, now 5.4 cm (previously 0.8 cm 09/2021, but smaller than his CT scan from the [place] done in December 2023).  There is stable adjacent clustered nodularity peripheral to this 1.7 cm.  Question raised for possible indolent infection like Mycobacterium avium.  Also noted some borderline enlarged right upper paratracheal lymph nodes, stable left adrenal nodule (compared with 09/2021).      Review of Systems As per HPI     Objective:   Physical Exam  Vitals:   08/30/22 1120  BP: 136/72  Pulse: 61  Temp: (!) 97.5 F (36.4 C)  TempSrc: Oral  SpO2: 94%  Weight: 271 lb 9.6 oz (123.2 kg)  Height: 6' (1.829 m)   Gen: Pleasant, obese man, in no distress,  normal affect  ENT: No lesions,  mouth clear,  oropharynx clear, no postnasal drip  Neck: No JVD, no stridor  Lungs: No use of accessory muscles, no crackles or wheezing on normal respiration, no wheeze  on forced expiration  Cardiovascular: RRR, harsh holosystolic murmur with intact S2.  2+ bilateral lower extremity edema with compression hose in place  Musculoskeletal: No deformities, no cyanosis or clubbing  Neuro: alert, awake, non focal  Skin: Warm, no lesions or rash      Assessment & Plan:  Abnormal CT of the chest Waxing and waning pulmonary nodular disease especially in the apical left upper lobe.  Hypermetabolic on PET scan but the decrease in size of his nodular disease is always been reassuring so we have deferred bronchoscopy.  There was an enlargement in the left upper lobe nodule on his surveillance CT from the [place] done in December so we repeated an March.  Again the left upper lobe nodular opacity had decreased in size (still larger than it was in May 2023).  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  We talked about possible bronchoscopy but since he is asymptomatic and since the nodular disease has decreased in size we decided to defer.  We will instead repeat his CT scan of the chest in 6 months which would be September 2024.  Follow-up to review.  Sleep apnea Good compliance and clinical benefit from his CPAP.  He will continue same nightly  Obstructive lung disease (generalized) (HCC) Tried Spiriva, unclear whether he got  any benefit.  His functional capacity is pretty good although he does still have some exertional dyspnea.  Wants to hold off on maintenance bronchodilator therapy for now.  Gary Pupa, MD, PhD 08/30/2022, 12:00 PM [facility] [phone] or if no answer before 7:00PM call [phone] For any issues after 7:00PM please call eLink [phone]

## 2022-08-30 NOTE — Assessment & Plan Note (Signed)
Tried Spiriva, unclear whether he got any benefit.  His functional capacity is pretty good although he does still have some exertional dyspnea.  Wants to hold off on maintenance bronchodilator therapy for now.

## 2022-08-30 NOTE — Assessment & Plan Note (Signed)
Good compliance and clinical benefit from his CPAP.  He will continue same nightly

## 2022-08-30 NOTE — Patient Instructions (Addendum)
We reviewed your CT scans of the chest today. We will repeat your CT chest in September 2024. We will hold off on restarting any inhaler medication at this time Continue your CPAP every night as you have been using it Follow Dr. Delton Coombes in September after your CT so we can review the results together.

## 2022-08-30 NOTE — Addendum Note (Signed)
Addended by: Dorisann Frames R on: 08/30/2022 01:30 PM   Modules accepted: Orders

## 2022-08-30 NOTE — Assessment & Plan Note (Signed)
Waxing and waning pulmonary nodular disease especially in the apical left upper lobe.  Hypermetabolic on PET scan but the decrease in size of his nodular disease is always been reassuring so we have deferred bronchoscopy.  There was an enlargement in the left upper lobe nodule on his surveillance CT from the [place] done in December so we repeated an March.  Again the left upper lobe nodular opacity had decreased in size (still larger than it was in May 2023).  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  We talked about possible bronchoscopy but since he is asymptomatic and since the nodular disease has decreased in size we decided to defer.  We will instead repeat his CT scan of the chest in 6 months which would be September 2024.  Follow-up to review.

## 2022-09-26 DIAGNOSIS — N390 Urinary tract infection, site not specified: Secondary | ICD-10-CM | POA: Diagnosis not present

## 2022-12-04 ENCOUNTER — Ambulatory Visit (INDEPENDENT_AMBULATORY_CARE_PROVIDER_SITE_OTHER): Payer: No Typology Code available for payment source | Admitting: Gastroenterology

## 2022-12-04 ENCOUNTER — Telehealth: Payer: Self-pay | Admitting: *Deleted

## 2022-12-04 ENCOUNTER — Encounter: Payer: Self-pay | Admitting: Gastroenterology

## 2022-12-04 VITALS — BP 108/63 | HR 69 | Temp 97.9°F | Ht 69.0 in | Wt 264.0 lb

## 2022-12-04 DIAGNOSIS — K625 Hemorrhage of anus and rectum: Secondary | ICD-10-CM | POA: Insufficient documentation

## 2022-12-04 DIAGNOSIS — K6289 Other specified diseases of anus and rectum: Secondary | ICD-10-CM | POA: Insufficient documentation

## 2022-12-04 MED ORDER — NITROGLYCERIN 0.4 % RE OINT: TOPICAL_OINTMENT | RECTAL | 0 refills | Status: DC

## 2022-12-04 NOTE — Patient Instructions (Addendum)
Apply thin one inch long line of Rectiv cream on gloved finger and insert inside anal canal twice daily for up to 3 weeks.  We will call to schedule colonoscopy.

## 2022-12-04 NOTE — Telephone Encounter (Signed)
  Request for patient to stop medication prior to procedure or is needing cleareance  12/04/22  Gary Carroll [birth year]  What type of surgery is being performed? Colonoscopy  When is surgery scheduled? TBD  What type of clearance is required (medical or pharmacy to hold medication or both? medication  Are there any medications that need to be held prior to surgery and how long? Eliquis x 2 days  Name of physician performing surgery?  Dr. Verl Bangs [facility] Phone: [phone] Fax: [phone]  Anethesia type (none, local, MAC, general)? MAC

## 2022-12-04 NOTE — Telephone Encounter (Signed)
 Faxed clearance.

## 2022-12-04 NOTE — Progress Notes (Addendum)
GI Office Note    Referring Provider: Glee Arvin, NP Primary Care Physician:  Benita Stabile, MD  Primary Gastroenterologist: Hennie Duos. Marletta Lor, DO   Chief Complaint   Chief Complaint  Patient presents with   New Patient (Initial Visit)    Referred by [facility] for rectal pain     History of Present Illness   Gary Carroll is a 77 y.o. male presenting today at the request of Nena Polio NP for rectal pain.  Patient is a referral from the [place].  For the past several weeks he has been having pain in the rectal area.  May have been going on for a couple of months now.  Recently seen at the [facility] in [place], urgent care.  On rectal exam according to the records, noted to have stool around the perirectal area, surrounding skin was erythematous, no visible hemorrhoids, no palpable hemorrhoids, rectal tone good.  Old healed pressure ulcer noted on the right buttock.  No blood in the stool.  Patient states he has been given a couple of creams to apply externally.  He continues to have rectal pain.  Finds it hard to sit for prolonged periods of time.  Sitting on a doughnut does not help.  Sometimes has pain with bowel movements but not always.  If he has hard stools or straining he may note bright red blood per rectum on the toilet tissue.  Some days has 2 stools a day, may skip a day at the most.  Sometimes uses stool softeners.  Feels like his stools are adequate.  Denies any abdominal pain, heartburn.  He has issues with lower extremity edema, intermittent sores on the lower extremities.  Has been evaluated at the wound care center locally and through the [place], recently seen last week and provided more creams.  History of A-fib on Eliquis.  States he has been told to still hold his Eliquis for 2 days when he has procedures that may prompt bleeding.  Has a history of chronic murmur.  His cardiologist is Dr. Channing Mutters the [place] in [place].  CT abdomen and pelvis with contrast September  2022 IMPRESSION: 1. Mildly enlarged but numerous lymph nodes are present in the para esophageal region, retroperitoneum, celiac axis, pelvis, and groin regions bilaterally. Changes are nonspecific and could represent reactive nodes, lymphoproliferative disorder, or metastatic lesions. 2. Prostate enlargement. Mildly thickened bladder wall could represent bladder outlet obstruction or cystitis. 3. Aortic atherosclerosis.  Nuc med PET scan December 2022: IMPRESSION: 1. Hypermetabolism corresponding to the left upper lobe nodularity described on the prior chest CT. Some of this nodularity is felt to be decreased, while 1 small nodule is new. Morphology as well as suggestion of waxing and waning size favors infectious or inflammatory etiology. Consider CT surveillance at 6-12 weeks. 2. Subtle hypermetabolism along the right minor fissure, possibly concurrent with a developing pulmonary nodule since 03/22/2021. This also favors an infectious or inflammatory etiology. Recommend attention on follow-up. 3. Low-level hypermetabolism within nodes within the chest, abdomen, and pelvis. These could be reactive or represent an indolent lymphoproliferative process. Consider CT follow-up at 6 months. 4. Prostatic hypermetabolism is suspicious for carcinoma. Correlate with PSA level, physical exam, and possibly dedicated prostate MRI. 5. Incidental findings, including: Coronary artery atherosclerosis. Aortic Atherosclerosis (ICD10-I70.0). Sinus disease.  CT super D chest without contrast March 2024: IMPRESSION: 1. Increased peribronchovascular nodularity/consolidation in the apicoposterior segment left upper lobe, with a nodular or consolidative portion measuring 5.4 by 3.0  by 1.5 cm (previously 0.8 by 0.7 cm). Adjacent stable clustered nodularity peripheral to this measuring 1.7 by 0.6 by 1.0 cm. Mild new tree-in-bud reticulonodular opacity also in the left upper lobe characteristic of  atypical infectious bronchiolitis. Overall opacities in this region appears to of been waxing and waning over the last 18 months. These lesions were hypermetabolic on PET-CT of 04/23/2021. Appearance favors atypical infectious process such as Mycobacterium avium complex, although some of these lesions are technically nonspecific and merit at the least surveillance imaging. 2. Borderline enlarged right upper paratracheal and AP window lymph nodes. 3. 1.0 by 1.6 cm mass of the left adrenal gland, internal density 17 Hounsfield units. This is nonspecific and could be a benign adenoma or less likely a metastatic lesion. Unchanged from 10/09/2021, surveillance recommended. 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 6. Aortic valve and mitral valve calcifications. 7. Coronary, aortic arch, and branch vessel atherosclerotic vascular disease. 8. Aortic atherosclerosis.  Last colonoscopy 2013: moderate diverticulosis.  Medications   Current Outpatient Medications  Medication Sig Dispense Refill   apixaban (ELIQUIS) 5 MG TABS tablet Take 1 tablet (5 mg total) by mouth 2 (two) times daily. 60 tablet 2   diphenhydrAMINE (BENADRYL) 25 MG tablet Take 50 mg by mouth at bedtime as needed for sleep.     furosemide (LASIX) 40 MG tablet Take 40 mg by mouth daily.     loratadine (CLARITIN) 10 MG tablet Take 10 mg by mouth daily as needed.     losartan (COZAAR) 50 MG tablet Take 50 mg by mouth daily.     MAGNESIUM PO Take by mouth.     Melatonin 5 MG TABS Take 10 mg by mouth at bedtime.      metFORMIN (GLUCOPHAGE-XR) 500 MG 24 hr tablet TAKE TWO TABLETS BY MOUTH DAILY FOR DIABETES (ANNUAL KIDNEY FUNCTION TESTING IS NEEDED)     metoprolol tartrate (LOPRESSOR) 25 MG tablet Take 1 tablet (25 mg total) by mouth 2 (two) times daily. 60 tablet 2   Multiple Vitamins-Minerals (MULTIVITAL) tablet Take 1 tablet by mouth daily.            Omega-3 Fatty Acids  (FISH OIL) 1000 MG CAPS Take 4 capsules by mouth daily.      oxybutynin (DITROPAN) 5 MG tablet Take 5 mg by mouth daily.     potassium chloride (K-DUR) 10 MEQ tablet Take 1 tablet (10 mEq total) by mouth daily. 30 tablet 0   rosuvastatin (CRESTOR) 20 MG tablet Take 20 mg by mouth daily.     tamsulosin (FLOMAX) 0.4 MG CAPS capsule Take 0.4 mg by mouth.     terbinafine (LAMISIL) 1 % cream APPLY MODERATE AMOUNT TO AFFECTED AREA TWICE A DAY     vitamin C (ASCORBIC ACID) 500 MG tablet Take 500 mg by mouth daily.     UNKNOWN TO PATIENT "2 new ones: one for indigestion and one to shrink my prostate"     No current facility-administered medications for this visit.    Allergies   Allergies as of 12/04/2022 - Review Complete 12/04/2022  Allergen Reaction Noted   Diovan [valsartan] Cough 12/20/2010   Tagamet [cimetidine] Other (See Comments)     Past Medical History   Past Medical History:  Diagnosis Date   A-fib (HCC)    Allergic rhinitis    Arthritis    Asthma    seasonal per pt   Bilateral knee pain    OA   Diverticulosis 02/2012  seen on colonoscopy   Erectile dysfunction    GERD (gastroesophageal reflux disease)    Heart murmur    Hiatal hernia    HLD (hyperlipidemia)    HTN (hypertension)    Hydronephrosis determined by ultrasound 08/22/2015   Overactive bladder    Prediabetes    Pulmonary lesion    Ringing of ears    Sleep apnea    Urinary frequency    Urinary urgency    Wears glasses     Past Surgical History   Past Surgical History:  Procedure Laterality Date   CATARACT EXTRACTION W/PHACO Left 12/07/2021   Procedure: CATARACT EXTRACTION PHACO AND INTRAOCULAR LENS PLACEMENT (IOC);  Surgeon: Fabio Pierce, MD;  Location: [facility];  Service: Ophthalmology;  Laterality: Left;  CDE 7.75   CATARACT EXTRACTION W/PHACO Right 12/21/2021   Procedure: CATARACT EXTRACTION PHACO AND INTRAOCULAR LENS PLACEMENT (IOC);  Surgeon: Fabio Pierce, MD;  Location: [facility];  Service:  Ophthalmology;  Laterality: Right;  CDE: 8.00   CHOLECYSTECTOMY     COLONOSCOPY  02/2012   Dr. 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/19/2015   Procedure: TOTAL KNEE ARTHROPLASTY;  Surgeon: Dannielle Huh, MD;  Location: [facility];  Service: Orthopedics;  Laterality: Right;    Past Family History   Family History  Problem Relation Age of Onset   Uterine cancer Mother    Cancer Mother        uterine   Coronary artery disease Father    Coronary artery disease Paternal Grandfather    Heart disease Paternal Grandfather    Colon cancer Neg Hx    Esophageal cancer Neg Hx    Stomach cancer Neg Hx    Rectal cancer Neg Hx    Liver disease Neg Hx     Past Social History   Social History   Socioeconomic History   Marital status: Divorced    Spouse name: Not on file   Number of children: 0   Years of education: Not on file   Highest education level: Not on file  Occupational History   Occupation: retired Lobbyist)    Employer: RETIRED  Tobacco Use   Smoking status: Former    Current packs/day: 0.00    Types: Cigarettes    Quit date: 01/18/2010    Years since quitting: 12.8   Smokeless tobacco: Never   Tobacco comments:    Smoked for 25 years- quit September 2011.  Passive exposure from wife  Vaping Use   Vaping status: Never Used  Substance and Sexual Activity   Alcohol use: Not Currently    Alcohol/week: 1.0 - 2.0 standard drink of alcohol    Types: 1 - 2 Glasses of wine per week   Drug use: No   Sexual activity: Not on file  Other Topics Concern   Not on file  Social History Narrative   Separated from wife (2014) and moved to [place]. Retired- worked in Production designer, theatre/television/film.    Social Determinants of Health   Financial Resource Strain: Not on file   Food Insecurity: Not on file  Transportation Needs: Not on file  Physical Activity: Not on file  Stress: Not on file  Social Connections: Not on file  Intimate Partner Violence: Not on file  Review of Systems   General: Negative for anorexia, weight loss, fever, chills, fatigue, weakness. Eyes: Negative for vision changes.  ENT: Negative for hoarseness, difficulty swallowing , nasal congestion. CV: Negative for chest pain, angina, palpitations, dyspnea on exertion, +peripheral edema. Heart murmur since age 88 Respiratory: Negative for dyspnea at rest, dyspnea on exertion, cough, sputum, wheezing.  GI: See history of present illness. GU:  Negative for dysuria, hematuria, urinary incontinence, urinary frequency, nocturnal urination.  MS: Negative for joint pain, low back pain.  Derm: Negative for rash or itching.  Neuro: Negative for weakness, abnormal sensation, seizure, frequent headaches, memory loss,  confusion.  Psych: Negative for anxiety, depression, suicidal ideation, hallucinations.  Endo: Negative for unusual weight change.  Heme: Negative for bruising or bleeding. Allergy: Negative for rash or hives.  Physical Exam   BP 108/63   Pulse 69   Temp 97.9 F (36.6 C)   Ht 5\' 9"  (1.753 m)   Wt 264 lb (119.7 kg)   BMI 38.99 kg/m    General: Well-nourished, well-developed in no acute distress.  Head: Normocephalic, atraumatic.   Eyes: Conjunctiva pink, no icterus. Mouth: Oropharyngeal mucosa moist and pink , no lesions erythema or exudate. Neck: Supple without thyromegaly, masses, or lymphadenopathy.  Lungs: Clear to auscultation bilaterally.  Heart: Regular rate and rhythm, no murmurs rubs or gallops.  Abdomen: Bowel sounds are normal, nontender, nondistended, no hepatosplenomegaly or masses,  no abdominal bruits or hernia, no rebound or guarding.   Rectal: external exam normal. Scar from prior pressure sore. Significantly tender on internal exam with tightening of  sphincter. Pain precluded complete exam. Able to insert fingertip depth only. Dark red mucous noted on gloved finger.  Extremities: No lower extremity edema. No clubbing or deformities.  Neuro: Alert and oriented x 4 , grossly normal neurologically.  Skin: Warm and dry, no rash or jaundice.   Psych: Alert and cooperative, normal mood and affect.  Labs   August 23, 2022: White blood cell count 6200, hemoglobin 12.9, hematocrit 39.6, MCV 88, platelets 198,000, BUN 22, creatinine 1.04, albumin 3.9, total bilirubin 0.6, alkaline phosphatase 90, AST 23, ALT 27, A1c 6.5  Imaging Studies   No results found.  Assessment   *Rectal pain *Rectal bleeding   Rectal exam limited today due to patient's discomfort.  Tolerated in urgent care recently as outlined above.  Suspect possible fissure but given limitations of exam, cannot exclude underlying malignancy.  Prior PET scan in December 2022 with concern for underlying prostate malignancy, it [place] unclear if he had follow up regarding this. Numerous other abnormalities on PET/CT chest as detailed above being followed by pulmonology.   PLAN   Rectiv anorectally BID for 3 weeks. Colonoscopy with Dr. Marletta Lor. ASA 3.  I have discussed the risks, alternatives, benefits with regards to but not limited to the risk of reaction to medication, bleeding, infection, perforation and the patient is agreeable to proceed. Written consent to be obtained. Will need approval to hold Eliquis 48 hours before procedure.    Addendum: see mychart message. Patient sees urology at [facility] for his prostate.   Leanna Battles. Melvyn Neth, MHS, PA-C [facility]

## 2022-12-05 MED ORDER — HYDROCORTISONE (PERIANAL) 2.5 % EX CREA: 1.0000 | TOPICAL_CREAM | Freq: Two times a day (BID) | CUTANEOUS | 1 refills | Status: DC

## 2022-12-12 NOTE — Telephone Encounter (Signed)
Ok to schedule colonoscopy. Hold eliquis 48 hours before.

## 2022-12-12 NOTE — Telephone Encounter (Signed)
Clearance under media tab.

## 2022-12-13 ENCOUNTER — Encounter: Payer: Self-pay | Admitting: *Deleted

## 2022-12-13 ENCOUNTER — Other Ambulatory Visit: Payer: Self-pay | Admitting: *Deleted

## 2022-12-13 MED ORDER — PEG 3350-KCL-NA BICARB-NACL 420 G PO SOLR: 4000.0000 mL | Freq: Once | ORAL | 0 refills | Status: AC

## 2022-12-13 NOTE — Telephone Encounter (Signed)
Pt has been scheduled for 01/13/23 with Dr.Carver. Instructions mailed and prep sent to the pharmacy.

## 2022-12-16 ENCOUNTER — Encounter: Payer: Self-pay | Admitting: *Deleted

## 2022-12-20 ENCOUNTER — Emergency Department (HOSPITAL_COMMUNITY): Payer: No Typology Code available for payment source

## 2022-12-20 ENCOUNTER — Inpatient Hospital Stay (HOSPITAL_COMMUNITY)
Admission: EM | Admit: 2022-12-20 | Discharge: 2022-12-23 | DRG: 291 | Disposition: A | Discharge status: Home / Self Care | Payer: No Typology Code available for payment source | Attending: Internal Medicine | Admitting: Internal Medicine

## 2022-12-20 ENCOUNTER — Other Ambulatory Visit: Payer: Self-pay

## 2022-12-20 ENCOUNTER — Encounter (HOSPITAL_COMMUNITY): Payer: Self-pay

## 2022-12-20 DIAGNOSIS — Z9049 Acquired absence of other specified parts of digestive tract: Secondary | ICD-10-CM

## 2022-12-20 DIAGNOSIS — Z6835 Body mass index (BMI) 35.0-35.9, adult: Secondary | ICD-10-CM

## 2022-12-20 DIAGNOSIS — R0602 Shortness of breath: Secondary | ICD-10-CM | POA: Diagnosis not present

## 2022-12-20 DIAGNOSIS — R600 Localized edema: Secondary | ICD-10-CM | POA: Diagnosis present

## 2022-12-20 DIAGNOSIS — I2489 Other forms of acute ischemic heart disease: Secondary | ICD-10-CM | POA: Diagnosis present

## 2022-12-20 DIAGNOSIS — Z961 Presence of intraocular lens: Secondary | ICD-10-CM | POA: Diagnosis present

## 2022-12-20 DIAGNOSIS — E669 Obesity, unspecified: Secondary | ICD-10-CM | POA: Diagnosis present

## 2022-12-20 DIAGNOSIS — Z713 Dietary counseling and surveillance: Secondary | ICD-10-CM

## 2022-12-20 DIAGNOSIS — R011 Cardiac murmur, unspecified: Secondary | ICD-10-CM | POA: Diagnosis present

## 2022-12-20 DIAGNOSIS — G4733 Obstructive sleep apnea (adult) (pediatric): Secondary | ICD-10-CM | POA: Diagnosis present

## 2022-12-20 DIAGNOSIS — Z7722 Contact with and (suspected) exposure to environmental tobacco smoke (acute) (chronic): Secondary | ICD-10-CM | POA: Diagnosis present

## 2022-12-20 DIAGNOSIS — G4730 Sleep apnea, unspecified: Secondary | ICD-10-CM | POA: Diagnosis present

## 2022-12-20 DIAGNOSIS — I110 Hypertensive heart disease with heart failure: Principal | ICD-10-CM | POA: Diagnosis present

## 2022-12-20 DIAGNOSIS — I5033 Acute on chronic diastolic (congestive) heart failure: Secondary | ICD-10-CM | POA: Diagnosis present

## 2022-12-20 DIAGNOSIS — Z87891 Personal history of nicotine dependence: Secondary | ICD-10-CM

## 2022-12-20 DIAGNOSIS — R7989 Other specified abnormal findings of blood chemistry: Secondary | ICD-10-CM | POA: Insufficient documentation

## 2022-12-20 DIAGNOSIS — Z7984 Long term (current) use of oral hypoglycemic drugs: Secondary | ICD-10-CM

## 2022-12-20 DIAGNOSIS — R339 Retention of urine, unspecified: Secondary | ICD-10-CM | POA: Diagnosis present

## 2022-12-20 DIAGNOSIS — Z7901 Long term (current) use of anticoagulants: Secondary | ICD-10-CM

## 2022-12-20 DIAGNOSIS — E785 Hyperlipidemia, unspecified: Secondary | ICD-10-CM | POA: Diagnosis present

## 2022-12-20 DIAGNOSIS — J309 Allergic rhinitis, unspecified: Secondary | ICD-10-CM | POA: Diagnosis present

## 2022-12-20 DIAGNOSIS — K219 Gastro-esophageal reflux disease without esophagitis: Secondary | ICD-10-CM | POA: Diagnosis present

## 2022-12-20 DIAGNOSIS — Z8249 Family history of ischemic heart disease and other diseases of the circulatory system: Secondary | ICD-10-CM

## 2022-12-20 DIAGNOSIS — E782 Mixed hyperlipidemia: Secondary | ICD-10-CM | POA: Diagnosis present

## 2022-12-20 DIAGNOSIS — Z1152 Encounter for screening for COVID-19: Secondary | ICD-10-CM

## 2022-12-20 DIAGNOSIS — I878 Other specified disorders of veins: Secondary | ICD-10-CM | POA: Diagnosis present

## 2022-12-20 DIAGNOSIS — I480 Paroxysmal atrial fibrillation: Secondary | ICD-10-CM | POA: Diagnosis present

## 2022-12-20 DIAGNOSIS — J9601 Acute respiratory failure with hypoxia: Secondary | ICD-10-CM | POA: Diagnosis present

## 2022-12-20 DIAGNOSIS — I4891 Unspecified atrial fibrillation: Secondary | ICD-10-CM | POA: Diagnosis present

## 2022-12-20 DIAGNOSIS — I10 Essential (primary) hypertension: Secondary | ICD-10-CM | POA: Diagnosis present

## 2022-12-20 DIAGNOSIS — M170 Bilateral primary osteoarthritis of knee: Secondary | ICD-10-CM | POA: Diagnosis present

## 2022-12-20 DIAGNOSIS — Z96651 Presence of right artificial knee joint: Secondary | ICD-10-CM | POA: Diagnosis present

## 2022-12-20 DIAGNOSIS — Z79899 Other long term (current) drug therapy: Secondary | ICD-10-CM

## 2022-12-20 DIAGNOSIS — R739 Hyperglycemia, unspecified: Secondary | ICD-10-CM | POA: Diagnosis present

## 2022-12-20 DIAGNOSIS — N401 Enlarged prostate with lower urinary tract symptoms: Secondary | ICD-10-CM | POA: Diagnosis present

## 2022-12-20 LAB — COMPREHENSIVE METABOLIC PANEL
ALT: 31 U/L (ref 0–44)
AST: 27 U/L (ref 15–41)
Albumin: 3.3 g/dL — ABNORMAL LOW (ref 3.5–5.0)
Alkaline Phosphatase: 64 U/L (ref 38–126)
Anion gap: 7 (ref 5–15)
BUN: 19 mg/dL (ref 8–23)
CO2: 21 mmol/L — ABNORMAL LOW (ref 22–32)
Calcium: 8.3 mg/dL — ABNORMAL LOW (ref 8.9–10.3)
Chloride: 109 mmol/L (ref 98–111)
Creatinine, Ser: 0.91 mg/dL (ref 0.61–1.24)
GFR, Estimated: >60 mL/min (ref >60)
Glucose, Bld: 172 mg/dL — ABNORMAL HIGH (ref 70–99)
Potassium: 3.8 mmol/L (ref 3.5–5.1)
Sodium: 137 mmol/L (ref 135–145)
Total Bilirubin: 0.4 mg/dL (ref 0.3–1.2)
Total Protein: 6.6 g/dL (ref 6.5–8.1)

## 2022-12-20 LAB — CK: Total CK: 208 U/L (ref 49–397)

## 2022-12-20 LAB — CBC WITH DIFFERENTIAL/PLATELET
Abs Immature Granulocytes: 0.02 10*3/uL (ref 0.00–0.07)
Basophils Absolute: 0 10*3/uL (ref 0.0–0.1)
Basophils Relative: 1 %
Eosinophils Absolute: 0.2 10*3/uL (ref 0.0–0.5)
Eosinophils Relative: 3 %
HCT: 37 % — ABNORMAL LOW (ref 39.0–52.0)
Hemoglobin: 11.9 g/dL — ABNORMAL LOW (ref 13.0–17.0)
Immature Granulocytes: 0 %
Lymphocytes Relative: 23 %
Lymphs Abs: 1.7 10*3/uL (ref 0.7–4.0)
MCH: 28.3 pg (ref 26.0–34.0)
MCHC: 32.2 g/dL (ref 30.0–36.0)
MCV: 87.9 fL (ref 80.0–100.0)
Monocytes Absolute: 0.8 10*3/uL (ref 0.1–1.0)
Monocytes Relative: 10 %
Neutro Abs: 4.9 10*3/uL (ref 1.7–7.7)
Neutrophils Relative %: 63 %
Platelets: 175 10*3/uL (ref 150–400)
RBC: 4.21 MIL/uL — ABNORMAL LOW (ref 4.22–5.81)
RDW: 15.2 % (ref 11.5–15.5)
WBC: 7.6 10*3/uL (ref 4.0–10.5)
nRBC: 0 % (ref 0.0–0.2)

## 2022-12-20 LAB — RESP PANEL BY RT-PCR (RSV, FLU A&B, COVID)  RVPGX2
Influenza A by PCR: NEGATIVE
Influenza B by PCR: NEGATIVE
Resp Syncytial Virus by PCR: NEGATIVE
SARS Coronavirus 2 by RT PCR: NEGATIVE

## 2022-12-20 LAB — APTT: aPTT: 34 seconds (ref 24–36)

## 2022-12-20 LAB — LACTIC ACID, PLASMA: Lactic Acid, Venous: 1.6 mmol/L (ref 0.5–1.9)

## 2022-12-20 LAB — TROPONIN I (HIGH SENSITIVITY): Troponin I (High Sensitivity): 16 ng/L (ref <18)

## 2022-12-20 MED ORDER — SODIUM CHLORIDE 0.9 % IV BOLUS
1000.0000 mL | Freq: Once | INTRAVENOUS | Status: AC
  Administered 2022-12-21: 1000 mL via INTRAVENOUS

## 2022-12-20 NOTE — ED Triage Notes (Signed)
Pt stated that he has been feeling weak and tired. Took his vitals at home and noted that HR was 120's.  Pt complains CP 1/10 dull on the RIGHT side of chest. SOB on exertion today. Pt also complains of diarrhea and decreased appetite  Denies ABD pain, nausea and vomiting.

## 2022-12-20 NOTE — ED Provider Notes (Signed)
[facility] DEPT [facility] Emergency Department Provider Note MRN:  [id number]  Arrival date & time: 12/22/22     Chief Complaint   Weakness   History of Present Illness   Gary Carroll is a 77 y.o. year-old male with a history of A-fib, hypertension presenting to the ED with chief complaint of weakness.  Feeling weak and tired recently, elevated heart rate today.  Dull mild right-sided chest pain, short of breath with exertion today.  Recent diarrhea and decreased appetite.  Denies abdominal pain.  No fever.  Review of Systems  A thorough review of systems was obtained and all systems are negative except as noted in the HPI and PMH.   Patient's Health History    Past Medical History:  Diagnosis Date   A-fib [facility])    Allergic rhinitis    Arthritis    Asthma    seasonal per pt   Bilateral knee pain    OA   Diverticulosis 02/2012   seen on colonoscopy   Erectile dysfunction    GERD (gastroesophageal reflux disease)    Heart murmur    Hiatal hernia    HLD (hyperlipidemia)    HTN (hypertension)    Hydronephrosis determined by ultrasound 08/22/2015   Overactive bladder    Prediabetes    Pulmonary lesion    Ringing of ears    Sleep apnea    Urinary frequency    Urinary urgency    Wears glasses     Past Surgical History:  Procedure Laterality Date   CATARACT EXTRACTION W/PHACO Left 12/07/2021   Procedure: CATARACT EXTRACTION PHACO AND INTRAOCULAR LENS PLACEMENT (IOC);  Surgeon: Fabio Pierce, MD;  Location: [facility];  Service: Ophthalmology;  Laterality: Left;  CDE 7.75   CATARACT EXTRACTION W/PHACO Right 12/21/2021   Procedure: CATARACT EXTRACTION PHACO AND INTRAOCULAR LENS PLACEMENT (IOC);  Surgeon: Fabio Pierce, MD;  Location: [facility];  Service: Ophthalmology;  Laterality: Right;  CDE: 8.00   CHOLECYSTECTOMY     COLONOSCOPY  02/2012   Dr. Arlyce Dice: sigmoid, transverse, and ascending colon diverticulosis   FINGER SURGERY Right    prostate procedure  2017    per patient, was having trouble urinating    TONSILLECTOMY     TOTAL KNEE ARTHROPLASTY  2012   left (Dr. 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/19/2015   Procedure: TOTAL KNEE ARTHROPLASTY;  Surgeon: Dannielle Huh, MD;  Location: [facility];  Service: Orthopedics;  Laterality: Right;    Family History  Problem Relation Age of Onset   Uterine cancer Mother    Cancer Mother        uterine   Coronary artery disease Father    Coronary artery disease Paternal Grandfather    Heart disease Paternal Grandfather    Colon cancer Neg Hx    Esophageal cancer Neg Hx    Stomach cancer Neg Hx    Rectal cancer Neg Hx    Liver disease Neg Hx     Social History   Socioeconomic History   Marital status: Divorced    Spouse name: Not on file   Number of children: 0   Years of education: Not on file   Highest education level: Not on file  Occupational History   Occupation: retired Lobbyist)    Employer: RETIRED  Tobacco Use   Smoking status: Former    Current packs/day: 0.00    Types: Cigarettes    Quit date: 01/18/2010    Years since quitting: 12.9   Smokeless tobacco:  Never   Tobacco comments:    Smoked for 25 years- quit September 2011.  Passive exposure from wife  Vaping Use   Vaping status: Never Used  Substance and Sexual Activity   Alcohol use: Not Currently    Alcohol/week: 1.0 - 2.0 standard drink of alcohol    Types: 1 - 2 Glasses of wine per week   Drug use: No   Sexual activity: Not on file  Other Topics Concern   Not on file  Social History Narrative   Separated from wife (2014) and moved to [place]. Retired- worked in Production designer, theatre/television/film.    Social Determinants of Health   Financial Resource Strain: Not on file  Food Insecurity: No Food Insecurity (12/21/2022)   Hunger Vital Sign    Worried About Running Out of Food in the Last Year: Never true    Ran Out of Food in the Last Year: Never true  Transportation Needs: No Transportation Needs (12/21/2022)   PRAPARE -  Administrator, Civil Service (Medical): No    Lack of Transportation (Non-Medical): No  Physical Activity: Not on file  Stress: Not on file  Social Connections: Not on file  Intimate Partner Violence: Not At Risk (12/21/2022)   Humiliation, Afraid, Rape, and Kick questionnaire    Fear of Current or Ex-Partner: No    Emotionally Abused: No    Physically Abused: No    Sexually Abused: No     Physical Exam   Vitals:   12/21/22 2342 12/22/22 0514  BP: (!) 131/59 (!) 142/58  Pulse: 77 63  Resp: 18 20  Temp: 98 F (36.7 C) 97.9 F (36.6 C)  SpO2: 96% 97%    CONSTITUTIONAL: Well-appearing, NAD NEURO/PSYCH:  Alert and oriented x 3, no focal deficits EYES:  eyes equal and reactive ENT/NECK:  no LAD, no JVD CARDIO: Tachycardic rate, well-perfused, normal S1 and S2 PULM:  CTAB no wheezing or rhonchi GI/GU:  non-distended, non-tender MSK/SPINE:  No gross deformities, no edema SKIN:  no rash, atraumatic   *Additional and/or pertinent findings included in MDM below  Diagnostic and Interventional Summary    EKG Interpretation Date/Time:  Saturday December 21 2022 01:41:52 EDT Ventricular Rate:  84 PR Interval:  199 QRS Duration:  107 QT Interval:  366 QTC Calculation: 433 R Axis:   15  Text Interpretation: Sinus rhythm Ventricular trigeminy Minimal ST depression, anterolateral leads Confirmed by Kennis Carina [phone]) on 12/21/2022 4:11:06 AM       Labs Reviewed  CBC WITH DIFFERENTIAL/PLATELET - Abnormal; Notable for the following components:      Result Value   RBC 4.21 (*)    Hemoglobin 11.9 (*)    HCT 37.0 (*)    All other components within normal limits  COMPREHENSIVE METABOLIC PANEL - Abnormal; Notable for the following components:   CO2 21 (*)    Glucose, Bld 172 (*)    Calcium 8.3 (*)    Albumin 3.3 (*)    All other components within normal limits  BRAIN NATRIURETIC PEPTIDE - Abnormal; Notable for the following components:   B Natriuretic Peptide  533.0 (*)    All other components within normal limits  COMPREHENSIVE METABOLIC PANEL - Abnormal; Notable for the following components:   Glucose, Bld 115 (*)    Calcium 8.3 (*)    Albumin 3.4 (*)    All other components within normal limits  CBC WITH DIFFERENTIAL/PLATELET - Abnormal; Notable for the following components:   Hemoglobin 12.2 (*)  All other components within normal limits  GLUCOSE, CAPILLARY - Abnormal; Notable for the following components:   Glucose-Capillary 193 (*)    All other components within normal limits  GLUCOSE, CAPILLARY - Abnormal; Notable for the following components:   Glucose-Capillary 224 (*)    All other components within normal limits  TROPONIN I (HIGH SENSITIVITY) - Abnormal; Notable for the following components:   Troponin I (High Sensitivity) 21 (*)    All other components within normal limits  TROPONIN I (HIGH SENSITIVITY) - Abnormal; Notable for the following components:   Troponin I (High Sensitivity) 56 (*)    All other components within normal limits  TROPONIN I (HIGH SENSITIVITY) - Abnormal; Notable for the following components:   Troponin I (High Sensitivity) 84 (*)    All other components within normal limits  RESP PANEL BY RT-PCR (RSV, FLU A&B, COVID)  RVPGX2  CK  APTT  LACTIC ACID, PLASMA  LACTIC ACID, PLASMA  BLOOD GAS, VENOUS  PROCALCITONIN  TSH  MAGNESIUM  BASIC METABOLIC PANEL  MAGNESIUM  TROPONIN I (HIGH SENSITIVITY)    CT Angio Chest Pulmonary Embolism (PE) W or WO Contrast  Final Result    DG Chest 2 View  Final Result      Medications  rosuvastatin (CRESTOR) tablet 20 mg (20 mg Oral Given 12/21/22 1025)  oxybutynin (DITROPAN) tablet 5 mg (5 mg Oral Not Given 12/21/22 1025)  tamsulosin (FLOMAX) capsule 0.4 mg (0.4 mg Oral Given 12/21/22 1654)  apixaban (ELIQUIS) tablet 5 mg (5 mg Oral Given 12/21/22 2145)  acetaminophen (TYLENOL) tablet 650 mg (has no administration in time range)    Or  acetaminophen (TYLENOL)  suppository 650 mg (has no administration in time range)  oxyCODONE (Oxy IR/ROXICODONE) immediate release tablet 5 mg (has no administration in time range)  ondansetron (ZOFRAN) tablet 4 mg (has no administration in time range)    Or  ondansetron (ZOFRAN) injection 4 mg (has no administration in time range)  metoprolol tartrate (LOPRESSOR) tablet 50 mg (50 mg Oral Given 12/21/22 2145)  furosemide (LASIX) injection 40 mg (40 mg Intravenous Given 12/21/22 1654)  melatonin tablet 9 mg (9 mg Oral Given 12/21/22 2145)  perflutren lipid microspheres (DEFINITY) IV suspension (3 mLs Intravenous Given 12/21/22 0948)  levalbuterol (XOPENEX) nebulizer solution 0.63 mg (has no administration in time range)  levalbuterol (XOPENEX) nebulizer solution 0.63 mg (has no administration in time range)  sodium chloride 0.9 % bolus 1,000 mL (0 mLs Intravenous Stopped 12/21/22 0325)  iohexol (OMNIPAQUE) 350 MG/ML injection 100 mL (100 mLs Intravenous Contrast Given 12/21/22 0300)  furosemide (LASIX) injection 20 mg (20 mg Intravenous Given 12/21/22 0405)  cefTRIAXone (ROCEPHIN) 2 g in sodium chloride 0.9 % 100 mL IVPB (0 g Intravenous Stopped 12/21/22 0450)  azithromycin (ZITHROMAX) 500 mg in sodium chloride 0.9 % 250 mL IVPB (0 mg Intravenous Stopped 12/21/22 0625)  ipratropium-albuterol (DUONEB) 0.5-2.5 (3) MG/3ML nebulizer solution 3 mL (3 mLs Nebulization Given 12/21/22 0429)  methylPREDNISolone sodium succinate (SOLU-MEDROL) 125 mg/2 mL injection 125 mg (125 mg Intravenous Given 12/21/22 0422)     Procedures  /  Critical Care .Critical Care  Performed by: Sabas Sous, MD Authorized by: Sabas Sous, MD   Critical care provider statement:    Critical care time (minutes):  32   Critical care was necessary to treat or prevent imminent or life-threatening deterioration of the following conditions:  Respiratory failure (Respiratory distress, hypoxia requiring noninvasive positive pressure ventilation)   Critical care was time  spent personally by me on the following activities:  Development of treatment plan with patient or surrogate, discussions with consultants, evaluation of patient's response to treatment, examination of patient, ordering and review of laboratory studies, ordering and review of radiographic studies, ordering and performing treatments and interventions, pulse oximetry, re-evaluation of patient's condition and review of old charts   ED Course and Medical Decision Making  Initial Impression and Ddx Patient sleeping comfortably, wakes easily, tachypneic, mildly tachycardic.  History of A-fib on Eliquis.  PE considered but felt to be unlikely given his anticoagulated status.  Other considerations include electrolyte disturbance, DKA, dehydration.  Past medical/surgical history that increases complexity of ED encounter: A-fib  Interpretation of Diagnostics I personally reviewed the EKG and my interpretation is as follows: First EKG with A-fib, mild RVR, second EKG sinus rhythm with ST depressions  Labs revealed minimally elevated second troponin, elevated BNP  Patient Reassessment and Ultimate Disposition/Management     Given the continued diagnostic uncertainty CT was obtained revealing some possible edema, possible pneumonia.  Fluids stopped, providing with Lasix, antibiotics.  On reassessment patient's oxygen in the low 90s, remains quite tachypneic, audibly wheezing.  Will also provide steroids, DuoNeb, CPAP to help with his effort.  Will admit to medicine.  Unclear if new onset CHF versus pneumonia versus new onset COPD versus pulmonary artery hypertension.  Patient management required discussion with the following services or consulting groups:  Hospitalist Service  Complexity of Problems Addressed Acute illness or injury that poses threat of life of bodily function  Additional Data Reviewed and Analyzed Further history obtained from: None  Additional Factors Impacting ED Encounter  Risk Consideration of hospitalization  Elmer Sow. Pilar Plate, MD [facility] Emergency Medicine [facility] [email or web address]  Final Clinical Impressions(s) / ED Diagnoses     ICD-10-CM   1. SOB (shortness of breath)  R06.02       ED Discharge Orders     None        Discharge Instructions Discussed with and Provided to Patient:   Discharge Instructions   None      Sabas Sous, MD 12/22/22 [phone]

## 2022-12-21 ENCOUNTER — Inpatient Hospital Stay (HOSPITAL_COMMUNITY): Payer: No Typology Code available for payment source

## 2022-12-21 ENCOUNTER — Emergency Department (HOSPITAL_COMMUNITY): Payer: No Typology Code available for payment source

## 2022-12-21 ENCOUNTER — Other Ambulatory Visit (HOSPITAL_COMMUNITY): Payer: No Typology Code available for payment source

## 2022-12-21 ENCOUNTER — Other Ambulatory Visit (HOSPITAL_COMMUNITY): Payer: Self-pay | Admitting: *Deleted

## 2022-12-21 DIAGNOSIS — I5033 Acute on chronic diastolic (congestive) heart failure: Secondary | ICD-10-CM | POA: Diagnosis present

## 2022-12-21 DIAGNOSIS — I509 Heart failure, unspecified: Secondary | ICD-10-CM | POA: Diagnosis not present

## 2022-12-21 DIAGNOSIS — R7989 Other specified abnormal findings of blood chemistry: Secondary | ICD-10-CM | POA: Diagnosis not present

## 2022-12-21 DIAGNOSIS — N401 Enlarged prostate with lower urinary tract symptoms: Secondary | ICD-10-CM | POA: Diagnosis present

## 2022-12-21 DIAGNOSIS — R600 Localized edema: Secondary | ICD-10-CM

## 2022-12-21 DIAGNOSIS — Z1152 Encounter for screening for COVID-19: Secondary | ICD-10-CM | POA: Diagnosis not present

## 2022-12-21 DIAGNOSIS — I480 Paroxysmal atrial fibrillation: Secondary | ICD-10-CM | POA: Diagnosis present

## 2022-12-21 DIAGNOSIS — G4730 Sleep apnea, unspecified: Secondary | ICD-10-CM | POA: Diagnosis present

## 2022-12-21 DIAGNOSIS — Z9049 Acquired absence of other specified parts of digestive tract: Secondary | ICD-10-CM | POA: Diagnosis not present

## 2022-12-21 DIAGNOSIS — K219 Gastro-esophageal reflux disease without esophagitis: Secondary | ICD-10-CM | POA: Diagnosis present

## 2022-12-21 DIAGNOSIS — Z7901 Long term (current) use of anticoagulants: Secondary | ICD-10-CM | POA: Diagnosis not present

## 2022-12-21 DIAGNOSIS — I878 Other specified disorders of veins: Secondary | ICD-10-CM | POA: Diagnosis present

## 2022-12-21 DIAGNOSIS — R339 Retention of urine, unspecified: Secondary | ICD-10-CM | POA: Diagnosis present

## 2022-12-21 DIAGNOSIS — Z8249 Family history of ischemic heart disease and other diseases of the circulatory system: Secondary | ICD-10-CM | POA: Diagnosis not present

## 2022-12-21 DIAGNOSIS — I10 Essential (primary) hypertension: Secondary | ICD-10-CM | POA: Diagnosis not present

## 2022-12-21 DIAGNOSIS — Z6835 Body mass index (BMI) 35.0-35.9, adult: Secondary | ICD-10-CM | POA: Diagnosis not present

## 2022-12-21 DIAGNOSIS — I2489 Other forms of acute ischemic heart disease: Secondary | ICD-10-CM | POA: Diagnosis present

## 2022-12-21 DIAGNOSIS — M170 Bilateral primary osteoarthritis of knee: Secondary | ICD-10-CM | POA: Diagnosis present

## 2022-12-21 DIAGNOSIS — R0602 Shortness of breath: Secondary | ICD-10-CM | POA: Diagnosis present

## 2022-12-21 DIAGNOSIS — Z961 Presence of intraocular lens: Secondary | ICD-10-CM | POA: Diagnosis present

## 2022-12-21 DIAGNOSIS — J810 Acute pulmonary edema: Secondary | ICD-10-CM

## 2022-12-21 DIAGNOSIS — I4891 Unspecified atrial fibrillation: Secondary | ICD-10-CM | POA: Diagnosis present

## 2022-12-21 DIAGNOSIS — Z96651 Presence of right artificial knee joint: Secondary | ICD-10-CM | POA: Diagnosis present

## 2022-12-21 DIAGNOSIS — E782 Mixed hyperlipidemia: Secondary | ICD-10-CM | POA: Diagnosis present

## 2022-12-21 DIAGNOSIS — J309 Allergic rhinitis, unspecified: Secondary | ICD-10-CM | POA: Diagnosis present

## 2022-12-21 DIAGNOSIS — E669 Obesity, unspecified: Secondary | ICD-10-CM | POA: Diagnosis present

## 2022-12-21 DIAGNOSIS — R011 Cardiac murmur, unspecified: Secondary | ICD-10-CM | POA: Diagnosis present

## 2022-12-21 DIAGNOSIS — I110 Hypertensive heart disease with heart failure: Secondary | ICD-10-CM | POA: Diagnosis present

## 2022-12-21 DIAGNOSIS — R739 Hyperglycemia, unspecified: Secondary | ICD-10-CM | POA: Diagnosis present

## 2022-12-21 DIAGNOSIS — J9601 Acute respiratory failure with hypoxia: Secondary | ICD-10-CM | POA: Diagnosis present

## 2022-12-21 DIAGNOSIS — Z87891 Personal history of nicotine dependence: Secondary | ICD-10-CM | POA: Diagnosis not present

## 2022-12-21 LAB — PROCALCITONIN: Procalcitonin: <0.1 ng/mL

## 2022-12-21 LAB — COMPREHENSIVE METABOLIC PANEL
ALT: 28 U/L (ref 0–44)
AST: 28 U/L (ref 15–41)
Albumin: 3.4 g/dL — ABNORMAL LOW (ref 3.5–5.0)
Alkaline Phosphatase: 62 U/L (ref 38–126)
Anion gap: 9 (ref 5–15)
BUN: 17 mg/dL (ref 8–23)
CO2: 22 mmol/L (ref 22–32)
Calcium: 8.3 mg/dL — ABNORMAL LOW (ref 8.9–10.3)
Chloride: 107 mmol/L (ref 98–111)
Creatinine, Ser: 0.87 mg/dL (ref 0.61–1.24)
GFR, Estimated: >60 mL/min (ref >60)
Glucose, Bld: 115 mg/dL — ABNORMAL HIGH (ref 70–99)
Potassium: 4.2 mmol/L (ref 3.5–5.1)
Sodium: 138 mmol/L (ref 135–145)
Total Bilirubin: 0.5 mg/dL (ref 0.3–1.2)
Total Protein: 7.1 g/dL (ref 6.5–8.1)

## 2022-12-21 LAB — ECHOCARDIOGRAM COMPLETE
AR max vel: 0.72 cm2
AV Area VTI: 0.93 cm2
AV Area mean vel: 0.78 cm2
AV Mean grad: 41 mmHg
AV Peak grad: 70.2 mmHg
Ao pk vel: 4.19 m/s
Area-P 1/2: 3.37 cm2
Height: 69 in
P 1/2 time: 351 msec
S' Lateral: 2.7 cm
Weight: 4240 oz

## 2022-12-21 LAB — CBC WITH DIFFERENTIAL/PLATELET
Abs Immature Granulocytes: 0.01 10*3/uL (ref 0.00–0.07)
Basophils Absolute: 0.1 10*3/uL (ref 0.0–0.1)
Basophils Relative: 1 %
Eosinophils Absolute: 0 10*3/uL (ref 0.0–0.5)
Eosinophils Relative: 0 %
HCT: 39.6 % (ref 39.0–52.0)
Hemoglobin: 12.2 g/dL — ABNORMAL LOW (ref 13.0–17.0)
Immature Granulocytes: 0 %
Lymphocytes Relative: 21 %
Lymphs Abs: 1.4 10*3/uL (ref 0.7–4.0)
MCH: 27.5 pg (ref 26.0–34.0)
MCHC: 30.8 g/dL (ref 30.0–36.0)
MCV: 89.4 fL (ref 80.0–100.0)
Monocytes Absolute: 0.7 10*3/uL (ref 0.1–1.0)
Monocytes Relative: 10 %
Neutro Abs: 4.6 10*3/uL (ref 1.7–7.7)
Neutrophils Relative %: 68 %
Platelets: 177 10*3/uL (ref 150–400)
RBC: 4.43 MIL/uL (ref 4.22–5.81)
RDW: 15.3 % (ref 11.5–15.5)
WBC: 6.8 10*3/uL (ref 4.0–10.5)
nRBC: 0 % (ref 0.0–0.2)

## 2022-12-21 LAB — BLOOD GAS, VENOUS
Acid-Base Excess: 1 mmol/L (ref 0.0–2.0)
Bicarbonate: 27.1 mmol/L (ref 20.0–28.0)
Drawn by: 31609
O2 Saturation: 63.8 %
Patient temperature: 36.7
pCO2, Ven: 47 mmHg (ref 44–60)
pH, Ven: 7.36 (ref 7.25–7.43)
pO2, Ven: 36 mmHg (ref 32–45)

## 2022-12-21 LAB — GLUCOSE, CAPILLARY
Glucose-Capillary: 193 mg/dL — ABNORMAL HIGH (ref 70–99)
Glucose-Capillary: 224 mg/dL — ABNORMAL HIGH (ref 70–99)

## 2022-12-21 LAB — TROPONIN I (HIGH SENSITIVITY)
Troponin I (High Sensitivity): 56 ng/L — ABNORMAL HIGH (ref <18)
Troponin I (High Sensitivity): 84 ng/L — ABNORMAL HIGH (ref <18)

## 2022-12-21 LAB — MAGNESIUM: Magnesium: 2.2 mg/dL (ref 1.7–2.4)

## 2022-12-21 LAB — BRAIN NATRIURETIC PEPTIDE: B Natriuretic Peptide: 533 pg/mL — ABNORMAL HIGH (ref 0.0–100.0)

## 2022-12-21 LAB — TSH: TSH: 2.105 u[IU]/mL (ref 0.350–4.500)

## 2022-12-21 MED ORDER — FUROSEMIDE 10 MG/ML IJ SOLN
20.0000 mg | Freq: Once | INTRAMUSCULAR | Status: AC
  Administered 2022-12-21: 20 mg via INTRAVENOUS
  Filled 2022-12-21: qty 2

## 2022-12-21 MED ORDER — METOPROLOL TARTRATE 25 MG PO TABS: 25.0000 mg | ORAL_TABLET | Freq: Two times a day (BID) | ORAL | Status: DC

## 2022-12-21 MED ORDER — MELATONIN 3 MG PO TABS
9.0000 mg | ORAL_TABLET | Freq: Every day | ORAL | Status: DC
  Administered 2022-12-21 – 2022-12-22 (×2): 9 mg via ORAL
  Filled 2022-12-21 (×3): qty 3

## 2022-12-21 MED ORDER — LEVALBUTEROL HCL 0.63 MG/3ML IN NEBU: 0.6300 mg | INHALATION_SOLUTION | Freq: Four times a day (QID) | RESPIRATORY_TRACT | Status: DC | PRN

## 2022-12-21 MED ORDER — IPRATROPIUM-ALBUTEROL 0.5-2.5 (3) MG/3ML IN SOLN
3.0000 mL | Freq: Once | RESPIRATORY_TRACT | Status: AC
  Administered 2022-12-21: 3 mL via RESPIRATORY_TRACT
  Filled 2022-12-21: qty 3

## 2022-12-21 MED ORDER — TAMSULOSIN HCL 0.4 MG PO CAPS
0.4000 mg | ORAL_CAPSULE | Freq: Every day | ORAL | Status: DC
  Administered 2022-12-21 – 2022-12-22 (×2): 0.4 mg via ORAL
  Filled 2022-12-21 (×3): qty 1

## 2022-12-21 MED ORDER — ONDANSETRON HCL 4 MG PO TABS: 4.0000 mg | ORAL_TABLET | Freq: Four times a day (QID) | ORAL | Status: DC | PRN

## 2022-12-21 MED ORDER — ONDANSETRON HCL 4 MG/2ML IJ SOLN: 4.0000 mg | Freq: Four times a day (QID) | INTRAMUSCULAR | Status: DC | PRN

## 2022-12-21 MED ORDER — SODIUM CHLORIDE 0.9 % IV SOLN
2.0000 g | Freq: Once | INTRAVENOUS | Status: AC
  Administered 2022-12-21: 2 g via INTRAVENOUS
  Filled 2022-12-21: qty 20

## 2022-12-21 MED ORDER — DILTIAZEM HCL 25 MG/5ML IV SOLN: 10.0000 mg | Freq: Once | INTRAVENOUS | Status: DC

## 2022-12-21 MED ORDER — OXYCODONE HCL 5 MG PO TABS
5.0000 mg | ORAL_TABLET | ORAL | Status: DC | PRN
  Filled 2022-12-21: qty 1

## 2022-12-21 MED ORDER — OXYBUTYNIN CHLORIDE 5 MG PO TABS
5.0000 mg | ORAL_TABLET | Freq: Every day | ORAL | Status: DC
  Administered 2022-12-22: 5 mg via ORAL
  Filled 2022-12-21 (×3): qty 1

## 2022-12-21 MED ORDER — SODIUM CHLORIDE 0.9 % IV SOLN
500.0000 mg | Freq: Once | INTRAVENOUS | Status: AC
  Administered 2022-12-21: 500 mg via INTRAVENOUS
  Filled 2022-12-21: qty 5

## 2022-12-21 MED ORDER — METOPROLOL TARTRATE 50 MG PO TABS
50.0000 mg | ORAL_TABLET | Freq: Two times a day (BID) | ORAL | Status: DC
  Administered 2022-12-21 – 2022-12-23 (×5): 50 mg via ORAL
  Filled 2022-12-21 (×5): qty 1

## 2022-12-21 MED ORDER — FUROSEMIDE 10 MG/ML IJ SOLN
40.0000 mg | Freq: Two times a day (BID) | INTRAMUSCULAR | Status: DC
  Administered 2022-12-21 – 2022-12-23 (×5): 40 mg via INTRAVENOUS
  Filled 2022-12-21 (×5): qty 4

## 2022-12-21 MED ORDER — PERFLUTREN LIPID MICROSPHERE
1.0000 mL | INTRAVENOUS | Status: AC | PRN
  Administered 2022-12-21: 3 mL via INTRAVENOUS

## 2022-12-21 MED ORDER — FUROSEMIDE 10 MG/ML IJ SOLN: 40.0000 mg | Freq: Every day | INTRAMUSCULAR | Status: DC

## 2022-12-21 MED ORDER — IOHEXOL 350 MG/ML SOLN
100.0000 mL | Freq: Once | INTRAVENOUS | Status: AC | PRN
  Administered 2022-12-21: 100 mL via INTRAVENOUS

## 2022-12-21 MED ORDER — LEVALBUTEROL HCL 0.63 MG/3ML IN NEBU
0.6300 mg | INHALATION_SOLUTION | Freq: Four times a day (QID) | RESPIRATORY_TRACT | Status: DC
  Administered 2022-12-21 (×3): 0.63 mg via RESPIRATORY_TRACT
  Filled 2022-12-21 (×3): qty 3

## 2022-12-21 MED ORDER — ROSUVASTATIN CALCIUM 20 MG PO TABS
20.0000 mg | ORAL_TABLET | Freq: Every day | ORAL | Status: DC
  Administered 2022-12-21 – 2022-12-23 (×3): 20 mg via ORAL
  Filled 2022-12-21 (×3): qty 1

## 2022-12-21 MED ORDER — METHYLPREDNISOLONE SODIUM SUCC 125 MG IJ SOLR
125.0000 mg | Freq: Once | INTRAMUSCULAR | Status: AC
  Administered 2022-12-21: 125 mg via INTRAVENOUS
  Filled 2022-12-21: qty 2

## 2022-12-21 MED ORDER — LEVALBUTEROL HCL 0.63 MG/3ML IN NEBU
0.6300 mg | INHALATION_SOLUTION | Freq: Two times a day (BID) | RESPIRATORY_TRACT | Status: DC
  Administered 2022-12-22 – 2022-12-23 (×3): 0.63 mg via RESPIRATORY_TRACT
  Filled 2022-12-21 (×3): qty 3

## 2022-12-21 MED ORDER — ACETAMINOPHEN 650 MG RE SUPP: 650.0000 mg | Freq: Four times a day (QID) | RECTAL | Status: DC | PRN

## 2022-12-21 MED ORDER — APIXABAN 5 MG PO TABS
5.0000 mg | ORAL_TABLET | Freq: Two times a day (BID) | ORAL | Status: DC
  Administered 2022-12-21 – 2022-12-23 (×5): 5 mg via ORAL
  Filled 2022-12-21 (×5): qty 1

## 2022-12-21 MED ORDER — MELATONIN 5 MG PO TABS
10.0000 mg | ORAL_TABLET | Freq: Every day | ORAL | Status: DC
  Filled 2022-12-21 (×2): qty 2

## 2022-12-21 MED ORDER — ACETAMINOPHEN 325 MG PO TABS
650.0000 mg | ORAL_TABLET | Freq: Four times a day (QID) | ORAL | Status: DC | PRN
  Administered 2022-12-22: 650 mg via ORAL
  Filled 2022-12-21: qty 2

## 2022-12-21 NOTE — Assessment & Plan Note (Addendum)
-   Secondary to vascular congestion/interstitial edema in the setting of acute on chronic diastolic heart failure. - CTA shows no PE, possible edema, and unchanged left upper lobe opacities and nodules. -No fever, no productive coughing spells. - Continue IV Lasix - Checking desaturation screening and continue the use of CPAP/BiPAP nightly. - Follow daily weights, strict I's and O's, low-sodium diet and Reds clip measurement.

## 2022-12-21 NOTE — Assessment & Plan Note (Signed)
Continue statin. 

## 2022-12-21 NOTE — ED Notes (Signed)
ED TO INPATIENT HANDOFF REPORT  ED Nurse Name and Phone #: Eileen Stanford [phone]  S Name/Age/Gender Gary Carroll 77 y.o. male Room/Bed: APA09/APA09  Code Status   Code Status: Full Code  Home/SNF/Other Home Patient oriented to: self, place, time, and situation Is this baseline? Yes   Triage Complete: Triage complete  Chief Complaint Acute respiratory failure with hypoxia (HCC) [J96.01] Atrial fibrillation with RVR (HCC) [I48.91]  Triage Note Pt stated that he has been feeling weak and tired. Took his vitals at home and noted that HR was 120's.  Pt complains CP 1/10 dull on the RIGHT side of chest. SOB on exertion today. Pt also complains of diarrhea and decreased appetite  Denies ABD pain, nausea and vomiting.    Allergies Allergies  Allergen Reactions   Diovan [Valsartan] Cough   Tagamet [Cimetidine] Other (See Comments)    irrittability    Level of Care/Admitting Diagnosis ED Disposition     ED Disposition  Admit   Condition  --   Comment  Hospital Area: [facility] [[id number]]  Level of Care: Telemetry [5]  Covid Evaluation: Asymptomatic - no recent exposure (last 10 days) testing not required  Diagnosis: Atrial fibrillation with RVR [facility]) [[id number]]  Admitting Physician: Vassie Loll [[id number]]  Attending Physician: Vassie Loll [[id number]]  Certification:: I certify this patient will need inpatient services for at least 2 midnights          B Medical/Surgery History Past Medical History:  Diagnosis Date   A-fib (HCC)    Allergic rhinitis    Arthritis    Asthma    seasonal per pt   Bilateral knee pain    OA   Diverticulosis 02/2012   seen on colonoscopy   Erectile dysfunction    GERD (gastroesophageal reflux disease)    Heart murmur    Hiatal hernia    HLD (hyperlipidemia)    HTN (hypertension)    Hydronephrosis determined by ultrasound 08/22/2015   Overactive bladder    Prediabetes    Pulmonary lesion    Ringing of ears    Sleep  apnea    Urinary frequency    Urinary urgency    Wears glasses    Past Surgical History:  Procedure Laterality Date   CATARACT EXTRACTION W/PHACO Left 12/07/2021   Procedure: CATARACT EXTRACTION PHACO AND INTRAOCULAR LENS PLACEMENT (IOC);  Surgeon: Fabio Pierce, MD;  Location: [facility];  Service: Ophthalmology;  Laterality: Left;  CDE 7.75   CATARACT EXTRACTION W/PHACO Right 12/21/2021   Procedure: CATARACT EXTRACTION PHACO AND INTRAOCULAR LENS PLACEMENT (IOC);  Surgeon: Fabio Pierce, MD;  Location: [facility];  Service: Ophthalmology;  Laterality: Right;  CDE: 8.00   CHOLECYSTECTOMY     COLONOSCOPY  02/2012   Dr. 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 12/21/22  9629  Latex;Double-lumen  less than 1            Intake/Output Last 24 hours  Intake/Output Summary (Last 24 hours) at 12/21/2022 1205 Last data filed at 12/21/2022 1202 Gross per 24 hour  Intake 600 ml  Output 3700 ml  Net -3100 ml    Labs/Imaging Results for orders placed or performed during the hospital encounter of 12/20/22 (from the past 48 hour(s))  Resp panel by RT-PCR (RSV, Flu A&B, Covid) Anterior Nasal Swab     Status: None   Collection Time: 12/20/22 10:24 PM   Specimen: Anterior Nasal Swab  Result Value Ref Range   SARS Coronavirus 2 by RT PCR NEGATIVE  NEGATIVE    Comment: (NOTE) SARS-CoV-2 target nucleic acids are NOT DETECTED.  The SARS-CoV-2 RNA is generally detectable in upper respiratory specimens during the acute phase of infection. The lowest concentration of SARS-CoV-2 viral copies this assay can detect is 138 copies/mL. A negative result does not preclude SARS-Cov-2 infection and should not be used as the sole basis for treatment or other patient management decisions. A negative result may occur with  improper specimen collection/handling, submission of specimen other than nasopharyngeal swab, presence of viral mutation(s) within the areas targeted by this assay, and inadequate number of viral copies(<138 copies/mL). A negative result must be combined with clinical observations, patient history, and epidemiological information. The expected result is Negative.  Fact Sheet for Patients:  BloggerCourse.com  Fact Sheet for Healthcare Providers:  SeriousBroker.it  This test is no t yet approved or cleared by the [place] FDA and  has been authorized for detection and/or diagnosis of SARS-CoV-2 by FDA under an Emergency Use Authorization (EUA). This EUA will remain  in effect (meaning this test can be used) for the duration of the COVID-19 declaration under Section 564(b)(1) of the Act, 21 U.S.C.section 360bbb-3(b)(1), unless the authorization is terminated  or revoked sooner.       Influenza A by PCR NEGATIVE NEGATIVE   Influenza B by PCR NEGATIVE NEGATIVE    Comment: (NOTE) The Xpert Xpress SARS-CoV-2/FLU/RSV plus assay is intended as an aid in the diagnosis of influenza from Nasopharyngeal swab specimens and should not be used as a sole basis for treatment. Nasal washings and aspirates are unacceptable for Xpert Xpress SARS-CoV-2/FLU/RSV testing.  Fact Sheet for Patients: BloggerCourse.com  Fact Sheet for Healthcare  Providers: SeriousBroker.it  This test is not yet approved or cleared by the [place] FDA and has been authorized for detection and/or diagnosis of SARS-CoV-2 by FDA under an Emergency Use Authorization (EUA). This EUA will remain in effect (meaning this test can be used) for the duration of the COVID-19 declaration under Section 564(b)(1) of the Act, 21 U.S.C. section 360bbb-3(b)(1), unless the authorization is terminated or revoked.     Resp Syncytial Virus by PCR NEGATIVE NEGATIVE    Comment: (NOTE) Fact Sheet for Patients: BloggerCourse.com  Fact Sheet for Healthcare Providers: SeriousBroker.it  This test is not yet approved or cleared by the [place] FDA and has been authorized for detection and/or diagnosis of SARS-CoV-2 by FDA under an Emergency Use Authorization (EUA). This EUA will remain in effect (meaning this test can be used) for the duration of the COVID-19 declaration under Section 564(b)(1) of the Act, 21 U.S.C. section 360bbb-3(b)(1), unless the authorization is terminated or revoked.  Performed at [facility], [address] [place], [place] [address]   CBC with Differential     Status: Abnormal   Collection Time: 12/20/22 11:05 PM  Result Value Ref Range   WBC 7.6 4.0 - 10.5 K/uL   RBC 4.21 (L) 4.22 - 5.81 MIL/uL   Hemoglobin 11.9 (L) 13.0 - 17.0 g/dL   HCT 78.4 (L) 69.6 - 29.5 %   MCV 87.9 80.0 - 100.0 fL   MCH 28.3 26.0 - 34.0 pg   MCHC 32.2 30.0 - 36.0 g/dL   RDW 28.4 13.2 - 44.0 %   Platelets 175 150 - 400 K/uL   nRBC 0.0 0.0 - 0.2 %   Neutrophils Relative % 63 %   Neutro Abs 4.9 1.7 - 7.7 K/uL   Lymphocytes Relative 23 %   Lymphs Abs 1.7 0.7 - 4.0 K/uL   Monocytes Relative 10 %   Monocytes Absolute 0.8 0.1 - 1.0 K/uL   Eosinophils Relative 3 %   Eosinophils Absolute 0.2 0.0 - 0.5 K/uL   Basophils Relative 1 %   Basophils Absolute 0.0 0.0 - 0.1 K/uL    Immature Granulocytes 0 %   Abs Immature Granulocytes 0.02 0.00 - 0.07 K/uL    Comment: Performed at [facility], [address], [place], [place] [address]  Comprehensive metabolic panel     Status: Abnormal   Collection Time: 12/20/22 11:05 PM  Result Value Ref Range   Sodium 137 135 - 145 mmol/L   Potassium 3.8 3.5 - 5.1 mmol/L   Chloride 109 98 - 111 mmol/L   CO2 21 (L) 22 - 32 mmol/L   Glucose, Bld 172 (H) 70 - 99 mg/dL    Comment: Glucose reference range applies only to samples taken after fasting for at least 8 hours.   BUN 19 8 - 23 mg/dL   Creatinine, Ser 5.36 0.61 - 1.24 mg/dL   Calcium 8.3 (L) 8.9 - 10.3 mg/dL   Total Protein 6.6 6.5 - 8.1 g/dL   Albumin 3.3 (L) 3.5 - 5.0 g/dL   AST 27 15 - 41 U/L   ALT 31 0 - 44 U/L   Alkaline Phosphatase 64 38 - 126 U/L   Total Bilirubin 0.4 0.3 - 1.2 mg/dL   GFR, Estimated >64 >40 mL/min    Comment: (NOTE) Calculated using the CKD-EPI Creatinine Equation (2021)    Anion gap 7 5 - 15    Comment: Performed at [facility], [address], [place], [place] [address]  CK     Status: None   Collection Time: 12/20/22 11:05 PM  Result Value Ref Range   Total CK 208 49 - 397 U/L    Comment: Performed at [facility], [address], [place], [place] [address]  Troponin I (High Sensitivity)     Status: None   Collection Time: 12/20/22 11:05 PM  Result Value Ref Range   Troponin I (High Sensitivity) 16 <18 ng/L    Comment: (NOTE) Elevated high sensitivity troponin I (hsTnI) values and significant  changes across serial measurements may suggest ACS but many other  chronic and acute conditions are known to elevate hsTnI results.  Refer to the "Links" section for chest pain algorithms and additional  guidance. Performed at [facility] [place], [address] [place], [place] [address]   APTT     Status: None   Collection Time: 12/20/22 11:05 PM  Result Value Ref Range   aPTT 34 24 - 36 seconds    Comment: Performed at [facility], [address] [place], [place] [address]  Lactic acid, plasma     Status: None   Collection Time: 12/20/22 11:05 PM  Result Value Ref Range   Lactic Acid, Venous 1.6 0.5 - 1.9 mmol/L    Comment: Performed at [facility], [address] [place], [place] [address]  Brain natriuretic peptide     Status: Abnormal   Collection Time: 12/20/22 11:05 PM  Result Value Ref Range   B Natriuretic Peptide 533.0 (H) 0.0 - 100.0 pg/mL    Comment: Performed at [facility], [address], [place], [place] [address]  Lactic acid, plasma     Status: None   Collection Time: 12/21/22 12:14 AM  Result Value Ref Range   Lactic Acid, Venous 1.4 0.5 - 1.9 mmol/L    Comment: Performed at [facility], [address], [place], [place] [address]  Troponin I (High Sensitivity)     Status: Abnormal   Collection Time: 12/21/22 12:14 AM  Result Value Ref Range   Troponin I (High Sensitivity) 21 (H) <18 ng/L    Comment: (NOTE) Elevated high sensitivity troponin I (hsTnI) values and significant  changes across serial measurements may suggest ACS but many other  chronic and acute conditions are known to elevate hsTnI results.  Refer to the "Links" section for chest pain algorithms and additional  guidance. Performed at [facility], [address] [place], [place] [address]   Troponin I (High Sensitivity)     Status: Abnormal   Collection Time: 12/21/22  4:23 AM  Result Value Ref Range   Troponin I (High Sensitivity) 56 (H) <18 ng/L    Comment: DELTA CHECK NOTED (NOTE) Elevated high sensitivity troponin I (hsTnI) values and significant  changes across serial measurements may suggest ACS but many other  chronic and acute conditions are known to elevate hsTnI results.  Refer to the "Links" section for chest pain algorithms and additional  guidance. Performed at [facility], [address], [place], [place] [address]   Blood gas, venous     Status: None   Collection Time: 12/21/22  5:14 AM  Result Value Ref Range   pH,  Ven 7.36 7.25 - 7.43   pCO2, Ven 47 44 - 60 mmHg   pO2, Ven 36 32 - 45 mmHg   Bicarbonate 27.1 20.0 - 28.0 mmol/L   Acid-Base Excess 1.0 0.0 - 2.0 mmol/L   O2 Saturation 63.8 %   Patient temperature 36.7    Collection site BLOOD RIGHT HAND    Drawn by [phone]     Comment: Performed at [facility], [address] [place], [place] [address]  Procalcitonin     Status: None   Collection Time: 12/21/22  5:14 AM  Result Value Ref Range   Procalcitonin <0.10 ng/mL    Comment:        Interpretation: PCT (Procalcitonin) <= 0.5 ng/mL: Systemic infection (sepsis) is not likely. Local bacterial infection is possible. (NOTE)       Sepsis PCT Algorithm           Lower Respiratory Tract                                      Infection PCT Algorithm    ----------------------------     ----------------------------         PCT < 0.25 ng/mL                PCT < 0.10 ng/mL          Strongly encourage  Strongly discourage   discontinuation of antibiotics    initiation of antibiotics    ----------------------------     -----------------------------       PCT 0.25 - 0.50 ng/mL            PCT 0.10 - 0.25 ng/mL               OR       >80% decrease in PCT            Discourage initiation of                                            antibiotics      Encourage discontinuation           of antibiotics    ----------------------------     -----------------------------         PCT >= 0.50 ng/mL              PCT 0.26 - 0.50 ng/mL               AND        <80% decrease in PCT             Encourage initiation of                                             antibiotics       Encourage continuation           of antibiotics    ----------------------------     -----------------------------        PCT >= 0.50 ng/mL                  PCT > 0.50 ng/mL               AND         increase in PCT                  Strongly encourage                                      initiation of antibiotics    Strongly  encourage escalation           of antibiotics                                     -----------------------------                                           PCT <= 0.25 ng/mL                                                 OR                                        >  80% decrease in PCT                                      Discontinue / Do not initiate                                             antibiotics  Performed at [facility], [address], [place], [place] [address]   Comprehensive metabolic panel     Status: Abnormal   Collection Time: 12/21/22  5:14 AM  Result Value Ref Range   Sodium 138 135 - 145 mmol/L   Potassium 4.2 3.5 - 5.1 mmol/L   Chloride 107 98 - 111 mmol/L   CO2 22 22 - 32 mmol/L   Glucose, Bld 115 (H) 70 - 99 mg/dL    Comment: Glucose reference range applies only to samples taken after fasting for at least 8 hours.   BUN 17 8 - 23 mg/dL   Creatinine, Ser 1.91 0.61 - 1.24 mg/dL   Calcium 8.3 (L) 8.9 - 10.3 mg/dL   Total Protein 7.1 6.5 - 8.1 g/dL   Albumin 3.4 (L) 3.5 - 5.0 g/dL   AST 28 15 - 41 U/L   ALT 28 0 - 44 U/L   Alkaline Phosphatase 62 38 - 126 U/L   Total Bilirubin 0.5 0.3 - 1.2 mg/dL   GFR, Estimated >47 >82 mL/min    Comment: (NOTE) Calculated using the CKD-EPI Creatinine Equation (2021)    Anion gap 9 5 - 15    Comment: Performed at [facility], [address], [place], [place] [address]  CBC with Differential/Platelet     Status: Abnormal   Collection Time: 12/21/22  5:14 AM  Result Value Ref Range   WBC 6.8 4.0 - 10.5 K/uL   RBC 4.43 4.22 - 5.81 MIL/uL   Hemoglobin 12.2 (L) 13.0 - 17.0 g/dL   HCT 30.8 65.7 - 84.6 %   MCV 89.4 80.0 - 100.0 fL   MCH 27.5 26.0 - 34.0 pg   MCHC 30.8 30.0 - 36.0 g/dL   RDW 96.2 95.2 - 84.1 %   Platelets 177 150 - 400 K/uL   nRBC 0.0 0.0 - 0.2 %   Neutrophils Relative % 68 %   Neutro Abs 4.6 1.7 - 7.7 K/uL   Lymphocytes Relative 21 %   Lymphs Abs 1.4 0.7 - 4.0 K/uL   Monocytes Relative 10 %    Monocytes Absolute 0.7 0.1 - 1.0 K/uL   Eosinophils Relative 0 %   Eosinophils Absolute 0.0 0.0 - 0.5 K/uL   Basophils Relative 1 %   Basophils Absolute 0.1 0.0 - 0.1 K/uL   Immature Granulocytes 0 %   Abs Immature Granulocytes 0.01 0.00 - 0.07 K/uL    Comment: Performed at [facility], [address] [place], [place] [address]  TSH     Status: None   Collection Time: 12/21/22  5:14 AM  Result Value Ref Range   TSH 2.105 0.350 - 4.500 uIU/mL    Comment: Performed by a 3rd Generation assay with a functional sensitivity of <=0.01 uIU/mL. Performed at [facility], [address] [place], [place] [address]   Magnesium     Status: None   Collection Time: 12/21/22  5:14 AM  Result Value Ref Range  Magnesium 2.2 1.7 - 2.4 mg/dL    Comment: Performed at [facility], [address], [place], [place] [address]  Troponin I (High Sensitivity)     Status: Abnormal   Collection Time: 12/21/22  6:48 AM  Result Value Ref Range   Troponin I (High Sensitivity) 84 (H) <18 ng/L    Comment: (NOTE) Elevated high sensitivity troponin I (hsTnI) values and significant  changes across serial measurements may suggest ACS but many other  chronic and acute conditions are known to elevate hsTnI results.  Refer to the "Links" section for chest pain algorithms and additional  guidance. Performed at [facility], [address] [place], [place] [address]    CT Angio Chest Pulmonary Embolism (PE) W or WO Contrast  Result Date: 12/21/2022 CLINICAL DATA:  Pulmonary embolism suspected, high probability. Weakness, chest pain, and shortness of breath. EXAM: CT ANGIOGRAPHY CHEST WITH CONTRAST TECHNIQUE: Multidetector CT imaging of the chest was performed using the standard protocol during bolus administration of intravenous contrast. Multiplanar CT image reconstructions and MIPs were obtained to evaluate the vascular anatomy. RADIATION DOSE REDUCTION: This exam was performed according to the departmental  dose-optimization program which includes automated exposure control, adjustment of the mA and/or kV according to patient size and/or use of iterative reconstruction technique. CONTRAST:  OMNIPAQUE IOHEXOL 350 MG/ML SOLN COMPARISON:  07/25/2022, 02/02/2021. FINDINGS: Cardiovascular: The heart is enlarged and there is no pericardial effusion. Three-vessel coronary artery calcifications are noted. There is atherosclerotic calcification of the aorta without evidence of aneurysm. Pulmonary trunk is normal in caliber. No evidence of pulmonary embolism. Mediastinum/Nodes: Multiple enlarged lymph nodes are present in the mediastinum measuring up to 1.7 cm in the AP window and 1.4 cm in the subcarinal space. An enlarged lymph node is present at the right hilum. No axillary lymphadenopathy. The thyroid gland, trachea, and esophagus are within normal limits. Lungs/Pleura: Bronchial wall thickening is noted in the upper and lower lobes on the right. There is interlobular septal thickening bilaterally. No effusion or pneumothorax. A stable consolidative opacity is noted in the apical segment of the left upper lobe measuring 4.8 x 4.4 x 1.7 cm, not significantly changed from the prior exam when differences in technique are taken into consideration. Nodular opacities are noted in the left upper lobe adjacent to this measuring up to 1 cm, axial image 23, unchanged. Few tree-in-bud nodular densities are noted in the posterior segment of the left upper lobe, not significantly changed from the prior exam. A few scattered ground-glass opacities are noted in the right lung. Upper Abdomen: The gallbladder is surgically absent. A stable nodule is noted in the left adrenal gland. No acute abnormality. Musculoskeletal: Degenerative changes are present in the thoracic spine. No acute osseous abnormality. Review of the MIP images confirms the above findings. IMPRESSION: 1. No evidence of pulmonary embolism. 2. Bronchial wall thickening  on the right, which may be infectious or inflammatory. 3. A few scattered ground-glass opacities and interlobular septal thickening bilaterally, suggesting edema. 4. Left upper lobe consolidated opacities with associated satellite nodules, not significantly changed from the prior exam. Continued surveillance is recommended. 5. Stable left adrenal nodule, unchanged from 2022. 6. Coronary artery calcifications. 7. Aortic atherosclerosis. Electronically Signed   By: Thornell Sartorius M.D.   On: 12/21/2022 03:37   DG Chest 2 View  Result Date: 12/20/2022 CLINICAL DATA:  Short of breath. EXAM: CHEST - 2 VIEW COMPARISON:  CT chest 07/25/2022, chest x-ray 02/05/2021 FINDINGS: The heart and mediastinal contours are  within normal limits. Slightly increased left upper lobe interstitial marking. No focal consolidation. No pulmonary edema. No pleural effusion. No pneumothorax. No acute osseous abnormality. IMPRESSION: Slightly increased left upper lobe interstitial marking. Finding may be due to rotation. No radiographic evidence of focal consolidation. Electronically Signed   By: Tish Frederickson M.D.   On: 12/20/2022 23:14    Pending Labs Unresulted Labs (From admission, onward)     Start     Ordered   12/22/22 0500  Basic metabolic panel  Tomorrow morning,   R        12/21/22 0739   12/22/22 0500  Magnesium  Tomorrow morning,   R        12/21/22 0743            Vitals/Pain Today's Vitals   12/21/22 1045 12/21/22 1100 12/21/22 1115 12/21/22 1145  BP: 135/66     Pulse: 81 83 82 76  Resp: (!) 24 (!) 24 (!) 22 19  Temp:      TempSrc:      SpO2: 96% 96% 94% 97%  Weight:      Height:      PainSc:        Isolation Precautions No active isolations  Medications Medications  rosuvastatin (CRESTOR) tablet 20 mg (20 mg Oral Given 12/21/22 1025)  oxybutynin (DITROPAN) tablet 5 mg (5 mg Oral Not Given 12/21/22 1025)  tamsulosin (FLOMAX) capsule 0.4 mg (has no administration in time range)  apixaban  (ELIQUIS) tablet 5 mg (5 mg Oral Given 12/21/22 1025)  acetaminophen (TYLENOL) tablet 650 mg (has no administration in time range)    Or  acetaminophen (TYLENOL) suppository 650 mg (has no administration in time range)  oxyCODONE (Oxy IR/ROXICODONE) immediate release tablet 5 mg (has no administration in time range)  ondansetron (ZOFRAN) tablet 4 mg (has no administration in time range)    Or  ondansetron (ZOFRAN) injection 4 mg (has no administration in time range)  levalbuterol (XOPENEX) nebulizer solution 0.63 mg (0.63 mg Nebulization Given 12/21/22 0748)  metoprolol tartrate (LOPRESSOR) tablet 50 mg (50 mg Oral Given 12/21/22 1025)  furosemide (LASIX) injection 40 mg (40 mg Intravenous Given 12/21/22 0849)  melatonin tablet 9 mg (has no administration in time range)  perflutren lipid microspheres (DEFINITY) IV suspension (3 mLs Intravenous Given 12/21/22 0948)  sodium chloride 0.9 % bolus 1,000 mL (0 mLs Intravenous Stopped 12/21/22 0325)  iohexol (OMNIPAQUE) 350 MG/ML injection 100 mL (100 mLs Intravenous Contrast Given 12/21/22 0300)  furosemide (LASIX) injection 20 mg (20 mg Intravenous Given 12/21/22 0405)  cefTRIAXone (ROCEPHIN) 2 g in sodium chloride 0.9 % 100 mL IVPB (0 g Intravenous Stopped 12/21/22 0450)  azithromycin (ZITHROMAX) 500 mg in sodium chloride 0.9 % 250 mL IVPB (0 mg Intravenous Stopped 12/21/22 0625)  ipratropium-albuterol (DUONEB) 0.5-2.5 (3) MG/3ML nebulizer solution 3 mL (3 mLs Nebulization Given 12/21/22 0429)  methylPREDNISolone sodium succinate (SOLU-MEDROL) 125 mg/2 mL injection 125 mg (125 mg Intravenous Given 12/21/22 0422)    Mobility walks with device     Focused Assessments Cardiac Assessment Handoff:  Cardiac Rhythm: Normal sinus rhythm Lab Results  Component Value Date   CKTOTAL 208 12/20/2022   TROPONINI <0.03 09/30/2017   No results found for: "DDIMER" Does the Patient currently have chest pain? No    R Recommendations: See Admitting Provider Note  Report  given to:   Additional Notes: Foley in place

## 2022-12-21 NOTE — ED Notes (Signed)
Pt reports increased shob, denies pain.

## 2022-12-21 NOTE — ED Notes (Signed)
Pt taken to CT.

## 2022-12-21 NOTE — Progress Notes (Signed)
Received call from Dr. Pilar Plate that he wanted patient to have breathing treatment and be placed on Bipap.  Patient had visible increased WOB, could not complete sentence well.  Patient Tachypneic, rate in 30s.  Treatment given after placing patient on Bipap at 16/8, R16, 35%.  Patient's rate has came down to around 25 to 26; O2 sat at 100%.  Patient resting; will continue to monitor.

## 2022-12-21 NOTE — Progress Notes (Signed)
*  PRELIMINARY RESULTS* Echocardiogram 2D Echocardiogram has been performed with Definity.  Stacey Drain 12/21/2022, 10:18 AM

## 2022-12-21 NOTE — H&P (Signed)
History and Physical    Patient: Gary Carroll XBJ:[id number] DOB: 21-Dec-1945 DOA: 12/20/2022 DOS: the patient was seen and examined on 12/21/2022 PCP: Benita Stabile, MD  Patient coming from: Home  Chief Complaint:  Chief Complaint  Patient presents with   Weakness   HPI: Gary Carroll is a 77 y.o. male with medical history significant of atrial fibrillation, asthma, GERD, heart murmur, hyperlipidemia, hypertension, urinary retention requiring self cath at baseline, and more presents to the ED with a chief complaint of elevated heart rate.  Patient reports she has had general fatigue and malaise for several days.  He has had a decreased appetite as well.  He has not had chest pain, dyspnea, palpitations.  He has been checking his blood pressure at home and noticed his heart rate to be high.  Patient denies any fever, cough.  He denies any changes in urine habits.  He reports always wanted to do is lay around.  He does report he has loose stools but that has been the case since he started metformin a month ago.  He denies any melena or hematochezia.  Patient does have peripheral edema and reports that is normal for him.  H he has evidence of venous stasis changes in his lower extremities.  He reports that he measures his calf regularly and they are usually around 17 inches but they have been over 20 inches for the past couple of weeks.  Patient is on a fluid pill at home, but reports he has trouble adhering to the regimen because of the associated urinary frequency.  He does have missed doses.  He is not sure how often he misses a dose.  Patient denies shortness of breath and orthopnea.  Patient has no other complaints at this time.  Patient is full code.  He reports he has a living will.  He has been advised to bring that in so that we can scan his advance directives into the computer. Review of Systems: As mentioned in the history of present illness. All other systems reviewed and are  negative. Past Medical History:  Diagnosis Date   A-fib [facility])    Allergic rhinitis    Arthritis    Asthma    seasonal per pt   Bilateral knee pain    OA   Diverticulosis 02/2012   seen on colonoscopy   Erectile dysfunction    GERD (gastroesophageal reflux disease)    Heart murmur    Hiatal hernia    HLD (hyperlipidemia)    HTN (hypertension)    Hydronephrosis determined by ultrasound 08/22/2015   Overactive bladder    Prediabetes    Pulmonary lesion    Ringing of ears    Sleep apnea    Urinary frequency    Urinary urgency    Wears glasses    Past Surgical History:  Procedure Laterality Date   CATARACT EXTRACTION W/PHACO Left 12/07/2021   Procedure: CATARACT EXTRACTION PHACO AND INTRAOCULAR LENS PLACEMENT (IOC);  Surgeon: Fabio Pierce, MD;  Location: [facility];  Service: Ophthalmology;  Laterality: Left;  CDE 7.75   CATARACT EXTRACTION W/PHACO Right 12/21/2021   Procedure: CATARACT EXTRACTION PHACO AND INTRAOCULAR LENS PLACEMENT (IOC);  Surgeon: Fabio Pierce, MD;  Location: [facility];  Service: Ophthalmology;  Laterality: Right;  CDE: 8.00   CHOLECYSTECTOMY     COLONOSCOPY  02/2012   Dr. 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 His smoking use included cigarettes. He has never used smokeless tobacco. He reports that he does not currently use alcohol after a past usage of about 1.0 - 2.0 standard drink of alcohol per week. He reports that he does not use drugs.  Allergies  Allergen Reactions   Diovan [Valsartan] Cough   Tagamet [Cimetidine] Other (See  Comments)    irrittability    Family History  Problem Relation Age of Onset   Uterine cancer Mother    Cancer Mother        uterine   Coronary artery disease Father    Coronary artery disease Paternal Grandfather    Heart disease Paternal Grandfather    Colon cancer Neg Hx    Esophageal cancer Neg Hx    Stomach cancer Neg Hx    Rectal cancer Neg Hx    Liver disease Neg Hx     Prior to Admission medications   Medication Sig Start Date End Date Taking? Authorizing Provider  apixaban (ELIQUIS) 5 MG TABS tablet Take 1 tablet (5 mg total) by mouth 2 (two) times daily. 08/24/15   Philip Aspen, Limmie Patricia, MD  diphenhydrAMINE (BENADRYL) 25 MG tablet Take 50 mg by mouth at bedtime as needed for sleep.    [provider]  furosemide (LASIX) 40 MG tablet Take 40 mg by mouth daily. 07/27/19   [provider]  hydrocortisone (ANUSOL-HC) 2.5 % rectal cream Place 1 Application rectally 2 (two) times daily. For 14 days 12/05/22   Tiffany Kocher, PA-C  loratadine (CLARITIN) 10 MG tablet Take 10 mg by mouth daily as needed.    [provider]  losartan (COZAAR) 50 MG tablet Take 50 mg by mouth daily.    [provider]  MAGNESIUM PO Take by mouth.    [provider]  Melatonin 5 MG TABS Take 10 mg by mouth at bedtime.     [provider]  metFORMIN (GLUCOPHAGE-XR) 500 MG 24 hr tablet TAKE TWO TABLETS BY MOUTH DAILY FOR DIABETES (ANNUAL KIDNEY FUNCTION TESTING IS NEEDED) 07/18/22   [provider]  metoprolol tartrate (LOPRESSOR) 25 MG tablet Take 1 tablet (25 mg total) by mouth 2 (two) times daily. 08/24/15   Philip Aspen, Limmie Patricia, MD  Multiple Vitamins-Minerals (MULTIVITAL) tablet Take 1 tablet by mouth daily.    [provider]  Nitroglycerin 0.4 % OINT Apply one inch thin line of cream inside anal canal twice daily for up to 3 weeks. Used gloved finger to prevent headaches. 12/04/22   Tiffany Kocher, PA-C  Omega-3 Fatty Acids  (FISH OIL) 1000 MG CAPS Take 4 capsules by mouth daily.     [provider]  oxybutynin (DITROPAN) 5 MG tablet Take 5 mg by mouth daily. 06/01/19   [provider]  potassium chloride (K-DUR) 10 MEQ tablet Take 1 tablet (10 mEq total) by mouth daily. 10/09/17   Erick Blinks, MD  rosuvastatin (CRESTOR) 20 MG tablet Take 20 mg by mouth daily.    [provider]  tamsulosin (FLOMAX) 0.4 MG CAPS capsule Take 0.4 mg by mouth.    [provider]  terbinafine (LAMISIL) 1 % cream APPLY MODERATE AMOUNT TO AFFECTED AREA TWICE A DAY 12/13/20   [provider]  UNKNOWN TO PATIENT "2 new ones: one for indigestion and one to shrink my prostate"    [provider]  vitamin C (ASCORBIC ACID) 500 MG tablet Take 500 mg by mouth daily.    [provider]    Physical Exam: Vitals:   12/21/22 0410 12/21/22 0415 12/21/22 0430 12/21/22 0530  BP:  (!) 160/82  137/65  Pulse:  81 77 78  Resp:  (!) 30 (!) 25 (!) 22  Temp:      TempSrc:      SpO2: 92% 95% 100% 100%  Weight:      Height:       1.  General: Patient lying supine in bed,  no acute distress   2. Psychiatric: Alert and oriented x 3, mood and behavior normal for situation, pleasant and cooperative with exam   3. Neurologic: Speech and language are normal, face is symmetric, moves all 4 extremities voluntarily, at baseline without acute deficits on limited exam   4. HEENMT:  Head is atraumatic, normocephalic, pupils reactive to light, neck is supple, trachea is midline, mucous membranes are moist   5. Respiratory : Lungs are clear to auscultation bilaterally without wheezing, rhonchi, rales, no cyanosis, no increase in work of breathing or accessory muscle use   6. Cardiovascular : Heart rate normal, rhythm is regular, murmur present, rubs or gallops, venous stasis changes with peripheral edema, peripheral pulses palpated   7. Gastrointestinal:  Abdomen is soft, nondistended,  nontender to palpation bowel sounds active, no masses or organomegaly palpated   8. Skin:  Skin is warm, dry and intact without rashes, acute lesions, or ulcers on limited exam   9.Musculoskeletal:  No acute deformities or trauma, no asymmetry in tone, he cannot peripheral edema, peripheral pulses palpated, no tenderness to palpation in the extremities  Data Reviewed: In the ED Patient is afebrile, heart rate 81-133, respiratory rate 21-33, blood pressure 113/67-162/87, satting 88-97% No leukocytosis, hemoglobin stable Chemistry reveals a hyperglycemia at 172 Albumin slightly low at 3.3 BNP elevated at 533 Uptrending troponin No lactic acidosis CTA shows no PE but does show edema EKG initially shows A-fib RVR and then there was a spontaneous conversion to sinus rhythm Admission requested for acute respiratory failure with hypoxia requiring first oxygen supplementation and then escalation to BiPAP due to work of breathing Assessment and Plan: * Acute respiratory failure with hypoxia (HCC) - Likely related to CHF exacerbation - CTA shows no PE, possible edema, and unchanged left upper lobe opacities and nodules - Continue Lasix - Initially patient was requiring 2 L nasal cannula but he was started on BiPAP due to work of breathing - Continue BiPAP - Trope uptrending but likely demand ischemia - Continue to monitor  Elevated troponin - Troponin uptrending from 16, 21, 56 - Likely demand ischemia in the setting of A-fib with RVR and likely CHF - Continue to trend - Monitor on telemetry - Chest pain-free at this time - EKG shows initially A-fib with a rate of 118 and a QTc of 447 and then shows sinus rhythm with a rate of 84, QTc 4 and 33 without acute ischemic changes  Sleep apnea - Continue BiPAP  AF (paroxysmal atrial fibrillation) (HCC) - History of paroxysmal A-fib - Continue Eliquis and Lopressor - Triggered by possible CHF exacerbation? - Update echo - Monitor  electrolytes and replace  as indicated - Check TSH - Continue to monitor  Peripheral edema - Increased from baseline - Seems to be related to CHF although last ejection fraction was 55-60% with indeterminate diastolic parameters - Update echo - Fluid restrictions - Monitor intake and output - Reds clip - CTA chest shows possible edema - Diuretic started in the ED, continue diuretic - Patient does take Lasix p.o. at baseline but reports difficulty adhering to the regimen due to associated urinary frequency - Continue to monitor  Mixed hyperlipidemia - Continue statin  Essential hypertension, benign - Continue Lopressor - Holding ARB in setting of soft pressure, and prioritization of Lasix at this time      Advance Care Planning:   Code Status: Full Code  Consults: None at this time  Family Communication: No family at bedside  Severity of Illness: The appropriate patient status for this patient is INPATIENT. Inpatient status is judged to be reasonable and necessary in order to provide the required intensity of service to ensure the patient's safety. The patient's presenting symptoms, physical exam findings, and initial radiographic and laboratory data in the context of their chronic comorbidities is felt to place them at high risk for further clinical deterioration. Furthermore, it is not anticipated that the patient will be medically stable for discharge from the hospital within 2 midnights of admission.   * I certify that at the point of admission it is my clinical judgment that the patient will require inpatient hospital care spanning beyond 2 midnights from the point of admission due to high intensity of service, high risk for further deterioration and high frequency of surveillance required.*  Author: Lilyan Gilford, DO 12/21/2022 6:02 AM  For on call review [email or web address].

## 2022-12-21 NOTE — ED Notes (Signed)
Echo at bedside

## 2022-12-21 NOTE — Assessment & Plan Note (Signed)
-  Stable and well-controlled -Continue current antihypertensive regimen -Low-sodium diet discussed with patient.

## 2022-12-21 NOTE — Progress Notes (Addendum)
   12/21/22 1603  TOC Brief Assessment  Insurance and Status Reviewed  Patient has primary care physician Yes  Home environment has been reviewed From Home  Prior level of function: Independent  Prior/Current Home Services No current home services  Social Determinants of Health Reivew SDOH reviewed no interventions necessary  Readmission risk has been reviewed Yes [facility])  Transition of care needs transition of care needs identified, TOC will continue to follow   [facility] consult for CHF education.  CSW added Healthy Heart Self Care education to AVS and spoke with pt. At bedside, providing Heart Health self care info and advised information included in DC packet.

## 2022-12-21 NOTE — Assessment & Plan Note (Signed)
-   Increased from baseline -Most likely associated with diet indiscretion -Low-sodium diet discussed with patient -On presentation found to be on A-fib with RVR; very likely contributing to underlying diastolic heart failure. -Repeat echo demonstrating left ventricle hypertrophy, hyperdynamic systolic function with ejection fraction 70 to 75% and indeterminate diastolic parameters. -Low-sodium diet, daily weights, leg elevation and continue diuresis has been recommended.

## 2022-12-21 NOTE — ED Notes (Signed)
Pt able to stand with assistance, noted to be sob on exertion. After returning to the bed, EKG rhythm in normal sinus. Repeat EKG completed

## 2022-12-21 NOTE — Progress Notes (Signed)
Patient seen and examined; admitted after midnight secondary to generalized weakness, shortness of breath and palpitations.  Patient with prior history of atrial fibrillation.  Patient was found to be on A-fib with RVR on CT chest demonstrating interstitial edema/vascular congestion.  BNP was elevated and after discussing with patient he expressed mild orthopnea and has also noticed increased lower extremity swelling.  Stable vital signs, no chest pain, no nausea, no vomiting, no fever.  Please refer to H&P written by Dr. Carren Rang On 12/21/2022 for further info/details on admission.  Plan: -Continue to follow daily weights, heart healthy/low-sodium diet and strict I's and O's -Continue IV diuretics -Images not demonstrating the presence of infiltrates or acute cardiopulmonary process; antibiotics will not be continue at this time -Follow updated echo results -Continue adjusted dose of metoprolol, electrolytes stability (goal for potassium above 4 and magnesium above 2), telemetry monitoring and Eliquis for secondary prevention. -Hopefully home in the next 48 hours. -Continue BiPAP nightly.  Vassie Loll MD [phone]

## 2022-12-21 NOTE — Assessment & Plan Note (Signed)
Continue BiPAP.

## 2022-12-21 NOTE — ED Notes (Signed)
Pt attempted to use urinal without success. Currently resting comfortably

## 2022-12-21 NOTE — Assessment & Plan Note (Signed)
-   Troponin uptrending from 16, 21, 56 - Likely demand ischemia in the setting of A-fib with RVR and likely CHF - Continue to trend - Monitor on telemetry - Chest pain-free at this time - EKG shows initially A-fib with a rate of 118 and a QTc of 447 and then shows sinus rhythm with a rate of 84, QTc 4 and 33 without acute ischemic changes

## 2022-12-21 NOTE — ED Notes (Signed)
Pt having increased work of breathing, labored. MD at bedside, RT called for bipap

## 2022-12-21 NOTE — Assessment & Plan Note (Signed)
-   History of paroxysmal A-fib - Continue Eliquis for secondary prevention; continue adjusted dose of Lopressor -TSH within normal limits -Continue telemetry monitoring -Follow electrolytes and maintain potassium above 4 and magnesium above 2.

## 2022-12-21 NOTE — ED Notes (Signed)
Pt sitting up on side of bed eating breakfast at this time.

## 2022-12-22 DIAGNOSIS — I4891 Unspecified atrial fibrillation: Secondary | ICD-10-CM

## 2022-12-22 DIAGNOSIS — R7989 Other specified abnormal findings of blood chemistry: Secondary | ICD-10-CM

## 2022-12-22 DIAGNOSIS — I10 Essential (primary) hypertension: Secondary | ICD-10-CM

## 2022-12-22 DIAGNOSIS — G4730 Sleep apnea, unspecified: Secondary | ICD-10-CM

## 2022-12-22 DIAGNOSIS — E782 Mixed hyperlipidemia: Secondary | ICD-10-CM

## 2022-12-22 DIAGNOSIS — J9601 Acute respiratory failure with hypoxia: Secondary | ICD-10-CM

## 2022-12-22 DIAGNOSIS — I480 Paroxysmal atrial fibrillation: Secondary | ICD-10-CM

## 2022-12-22 LAB — BASIC METABOLIC PANEL WITH GFR
Anion gap: 8 (ref 5–15)
BUN: 22 mg/dL (ref 8–23)
CO2: 26 mmol/L (ref 22–32)
Calcium: 8.7 mg/dL — ABNORMAL LOW (ref 8.9–10.3)
Chloride: 104 mmol/L (ref 98–111)
Creatinine, Ser: 0.84 mg/dL (ref 0.61–1.24)
GFR, Estimated: >60 mL/min (ref >60)
Glucose, Bld: 126 mg/dL — ABNORMAL HIGH (ref 70–99)
Potassium: 4 mmol/L (ref 3.5–5.1)
Sodium: 138 mmol/L (ref 135–145)

## 2022-12-22 LAB — GLUCOSE, CAPILLARY
Glucose-Capillary: 119 mg/dL — ABNORMAL HIGH (ref 70–99)
Glucose-Capillary: 194 mg/dL — ABNORMAL HIGH (ref 70–99)
Glucose-Capillary: 131 mg/dL — ABNORMAL HIGH (ref 70–99)

## 2022-12-22 LAB — MAGNESIUM: Magnesium: 2.1 mg/dL (ref 1.7–2.4)

## 2022-12-22 MED ORDER — CHLORHEXIDINE GLUCONATE CLOTH 2 % EX PADS
6.0000 | MEDICATED_PAD | Freq: Every day | CUTANEOUS | Status: DC
  Administered 2022-12-23: 6 via TOPICAL

## 2022-12-22 NOTE — Progress Notes (Signed)
Progress Note   [name] DOB: May 31, 1945 DOA: 12/20/2022     1 DOS: the patient was seen and examined on 12/22/2022   Brief hospital admission narrative: As per H&P written by Dr. Carren Rang on 12/21/22 Gary Carroll is a 77 y.o. male with medical history significant of atrial fibrillation, asthma, GERD, heart murmur, hyperlipidemia, hypertension, urinary retention requiring self cath at baseline, and more presents to the ED with a chief complaint of elevated heart rate.  Patient reports she has had general fatigue and malaise for several days.  He has had a decreased appetite as well.  He has not had chest pain, dyspnea, palpitations.  He has been checking his blood pressure at home and noticed his heart rate to be high.  Patient denies any fever, cough.  He denies any changes in urine habits.  He reports always wanted to do is lay around.  He does report he has loose stools but that has been the case since he started metformin a month ago.  He denies any melena or hematochezia.  Patient does have peripheral edema and reports that is normal for him.  H he has evidence of venous stasis changes in his lower extremities.  He reports that he measures his calf regularly and they are usually around 17 inches but they have been over 20 inches for the past couple of weeks.  Patient is on a fluid pill at home, but reports he has trouble adhering to the regimen because of the associated urinary frequency.  He does have missed doses.  He is not sure how often he misses a dose.  Patient denies shortness of breath and orthopnea.  Patient has no other complaints at this time.   Assessment and Plan: * Acute respiratory failure with hypoxia (HCC) - Secondary to vascular congestion/interstitial edema in the setting of acute on chronic diastolic heart failure. - CTA shows no PE, possible edema, and unchanged left upper lobe opacities and nodules. -No fever, no productive coughing  spells. - Continue IV Lasix - Checking desaturation screening and continue the use of CPAP/BiPAP nightly. - Follow daily weights, strict I's and O's, low-sodium diet and Reds clip measurement.  Elevated troponin - Troponin uptrending from 16, 21, 56 - Likely demand ischemia in the setting of A-fib with RVR and likely CHF exacerbation. -Patient denies chest pain -No acute ischemic changes otherwise appreciated on telemetry or EKG. -Continue the use of beta-blocker, Eliquis and continue treatment for CHF exacerbation with IV Lasix. -2D echo demonstrated no wall motion abnormalities.  Sleep apnea - Continue BiPAP/CPAP nightly -Weight loss recommended.  AF (paroxysmal atrial fibrillation) (HCC) - History of paroxysmal A-fib - Continue Eliquis for secondary prevention; continue adjusted dose of Lopressor -TSH within normal limits -Continue telemetry monitoring -Follow electrolytes and maintain potassium above 4 and magnesium above 2.  Peripheral edema - Increased from baseline -Most likely associated with diet indiscretion -Low-sodium diet discussed with patient -On presentation found to be on A-fib with RVR; very likely contributing to underlying diastolic heart failure. -Repeat echo demonstrating left ventricle hypertrophy, hyperdynamic systolic function with ejection fraction 70 to 75% and indeterminate diastolic parameters. -Low-sodium diet, daily weights, leg elevation and continue diuresis has been recommended.  Mixed hyperlipidemia - Continue statin  Essential hypertension, benign -Stable and well-controlled -Continue current antihypertensive regimen -Low-sodium diet discussed with patient.  Class II obesity -Body mass index is 35.46 kg/m. -Low-calorie diet and portion control discussed with patient.  Subjective:  No fever, no  chest pain, no nausea vomiting.  Still feeling short winded with activity and having mild orthopnea.  2 L nasal, supplement  Physical  Exam: Vitals:   12/22/22 0514 12/22/22 0730 12/22/22 0800 12/22/22 1205  BP: (!) 142/58  (!) 143/60 (!) 136/55  Pulse: 63  70 66  Resp: 20   16  Temp: 97.9 F (36.6 C)   98.7 F (37.1 C)  TempSrc:    Oral  SpO2: 97% 97%  98%  Weight:      Height:       General exam: Alert, awake, oriented x 3; reports feeling better and breathing easier; slightly short of breath with activity and having very mild orthopnea symptoms. Respiratory system: Decreased breath sounds at the bases; no using accessory muscles.  Good saturation on 2 L supplementation (97%). Cardiovascular system: Rate controlled, no rubs, no gallops, unable to properly assess JVD with body habitus.  Positive systolic murmur. Gastrointestinal system: Abdomen is obese, nondistended, soft and nontender. No organomegaly or masses felt. Normal bowel sounds heard. Central nervous system: No focal neurological deficits. Extremities: No cyanosis or clubbing; 1-2+ edema with chronic stasis dermatitis appreciated. Skin: No petechiae. Psychiatry: Judgement and insight appear normal. Mood & affect appropriate.   Data Reviewed:   Family Communication: No family at bedside.  Disposition: Status is: Inpatient Remains inpatient appropriate because: Continue IV diuresis.   Planned Discharge Destination: Home   Time spent: 40 minutes  Author: Vassie Loll, MD 12/22/2022 4:21 PM  For on call review [email or web address].

## 2022-12-23 DIAGNOSIS — J9601 Acute respiratory failure with hypoxia: Secondary | ICD-10-CM | POA: Diagnosis not present

## 2022-12-23 DIAGNOSIS — E782 Mixed hyperlipidemia: Secondary | ICD-10-CM | POA: Diagnosis not present

## 2022-12-23 DIAGNOSIS — I10 Essential (primary) hypertension: Secondary | ICD-10-CM | POA: Diagnosis not present

## 2022-12-23 DIAGNOSIS — G4730 Sleep apnea, unspecified: Secondary | ICD-10-CM | POA: Diagnosis not present

## 2022-12-23 DIAGNOSIS — R0602 Shortness of breath: Principal | ICD-10-CM

## 2022-12-23 MED ORDER — LOSARTAN POTASSIUM 50 MG PO TABS: 25.0000 mg | ORAL_TABLET | Freq: Every day | ORAL | Status: DC

## 2022-12-23 MED ORDER — FUROSEMIDE 40 MG PO TABS: 60.0000 mg | ORAL_TABLET | Freq: Every day | ORAL | 2 refills | Status: DC

## 2022-12-23 NOTE — Plan of Care (Signed)
Pt alert and orientedx 4 up with 1 assist with walker. Tylenol given x 1 for gen pain. Bipap used per home regimen and setting during the night. Vitals stable. Edema noted to bilateral legs. Pt refused scd during the night due to leg pain.  Problem: Education: Goal: Ability to demonstrate management of disease process will improve Outcome: Progressing Goal: Ability to verbalize understanding of medication therapies will improve Outcome: Progressing Goal: Individualized Educational Video(s) Outcome: Progressing   Problem: Activity: Goal: Capacity to carry out activities will improve Outcome: Progressing   Problem: Cardiac: Goal: Ability to achieve and maintain adequate cardiopulmonary perfusion will improve Outcome: Progressing   Problem: Education: Goal: Knowledge of General Education information will improve Description: Including pain rating scale, medication(s)/side effects and non-pharmacologic comfort measures Outcome: Progressing   Problem: Health Behavior/Discharge Planning: Goal: Ability to manage health-related needs will improve Outcome: Progressing   Problem: Clinical Measurements: Goal: Ability to maintain clinical measurements within normal limits will improve Outcome: Progressing Goal: Will remain free from infection Outcome: Progressing Goal: Diagnostic test results will improve Outcome: Progressing Goal: Respiratory complications will improve Outcome: Progressing Goal: Cardiovascular complication will be avoided Outcome: Progressing   Problem: Activity: Goal: Risk for activity intolerance will decrease Outcome: Progressing   Problem: Nutrition: Goal: Adequate nutrition will be maintained Outcome: Progressing   Problem: Coping: Goal: Level of anxiety will decrease Outcome: Progressing   Problem: Elimination: Goal: Will not experience complications related to bowel motility Outcome: Progressing Goal: Will not experience complications related to  urinary retention Outcome: Progressing   Problem: Pain Managment: Goal: General experience of comfort will improve Outcome: Progressing   Problem: Safety: Goal: Ability to remain free from injury will improve Outcome: Progressing   Problem: Skin Integrity: Goal: Risk for impaired skin integrity will decrease Outcome: Progressing

## 2022-12-23 NOTE — TOC Progression Note (Signed)
Transition of Care [facility]) - Progression Note    Patient Details  Name: Gary Carroll MRN: [id number] Date of Birth: 11/17/1945  [facility] CM/SW Contact  Leitha Bleak, RN Phone Number: 12/23/2022, 2:17 PM  Clinical Narrative:   Patient discharging home. TOC called to offer home health. Patient is not interested at this time. He drives himself, uses a cane and goes to the [facility].     Expected Discharge Plan: Home/Self Care Barriers to Discharge: Continued Medical Work up  Expected Discharge Plan and Services    Social Determinants of Health (SDOH) Interventions SDOH Screenings   Food Insecurity: No Food Insecurity (12/21/2022)  Housing: Low Risk  (12/21/2022)  Transportation Needs: No Transportation Needs (12/21/2022)  Utilities: Not At Risk (12/21/2022)  Depression (PHQ2-9): Low Risk  (03/12/2019)  Tobacco Use: Medium Risk (12/20/2022)    Readmission Risk Interventions    02/07/2021    1:55 PM  Readmission Risk Prevention Plan  Transportation Screening Complete  PCP or Specialist Appt within 5-7 Days Complete  Home Care Screening Complete  Medication Review (RN CM) Referral to Pharmacy

## 2022-12-23 NOTE — Discharge Summary (Signed)
Physician Discharge Summary   Patient: Gary Carroll MRN: [id number] DOB: [birth year]  Admit date:     12/20/2022  Discharge date: 12/23/22  Discharge Physician: Vassie Loll   PCP: Benita Stabile, MD   Recommendations at discharge:  Repeat basic metabolic panel to follow-up electrolytes and renal function stability. Reassess patient blood pressure and adjust antihypertensives if needed Should patient follow-up with cardiology service as instructed. Continue assisting patient with weight management.   Discharge Diagnoses: Principal Problem:   Acute respiratory failure with hypoxia (HCC) Active Problems:   Essential hypertension, benign   Mixed hyperlipidemia   Peripheral edema   AF (paroxysmal atrial fibrillation) (HCC)   Sleep apnea   Elevated troponin   Atrial fibrillation with RVR (HCC)   SOB (shortness of breath)  Brief hospital admission narrative: As per H&P written by Dr. Carren Rang on 12/21/22 Gary Carroll is a 77 y.o. male with medical history significant of atrial fibrillation, asthma, GERD, heart murmur, hyperlipidemia, hypertension, urinary retention requiring self cath at baseline, and more presents to the ED with a chief complaint of elevated heart rate.  Patient reports she has had general fatigue and malaise for several days.  He has had a decreased appetite as well.  He has not had chest pain, dyspnea, palpitations.  He has been checking his blood pressure at home and noticed his heart rate to be high.  Patient denies any fever, cough.  He denies any changes in urine habits.  He reports always wanted to do is lay around.  He does report he has loose stools but that has been the case since he started metformin a month ago.  He denies any melena or hematochezia.  Patient does have peripheral edema and reports that is normal for him.  H he has evidence of venous stasis changes in his lower extremities.  He reports that he measures his calf regularly and they are  usually around 17 inches but they have been over 20 inches for the past couple of weeks.  Patient is on a fluid pill at home, but reports he has trouble adhering to the regimen because of the associated urinary frequency.  He does have missed doses.  He is not sure how often he misses a dose.  Patient denies shortness of breath and orthopnea.  Patient has no other complaints at this time.   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 - CTA shows no PE, possible edema, and unchanged left upper lobe opacities and nodules. -No fever, no productive coughing spells. -Patient has been discharged home with adjusted dose of Lasix and instruction to maintain adequate hydration well following low-sodium diet and daily weights. - Checking desaturation screening and continue the use of CPAP/BiPAP nightly. -No oxygen requirement appreciated at time of discharge. -2D echo with indeterminant parameters for diastolic function; left ventricular hypertrophy, preserved ejection fraction and given history of arrhythmia very likely presence of diastolic heart failure can be inferred.    Elevated troponin - Troponin uptrending from 16, 21, 56 - Likely demand ischemia in the setting of A-fib with RVR and likely CHF exacerbation. -Patient denies chest pain -No acute ischemic changes otherwise appreciated on telemetry or EKG. -Continue the use of beta-blocker, Eliquis and continue treatment for CHF exacerbation with adjusted dose of diuretics. -2D echo reassuring while demonstrating preserved ejection fraction and no wall motion abnormalities.  Sleep apnea - Continue BiPAP/CPAP nightly -Weight loss recommended. -Weight loss management recommended.   AF (paroxysmal atrial fibrillation) (HCC) - History of paroxysmal A-fib - Continue Eliquis  for secondary prevention; continue adjusted dose of Lopressor -TSH within normal limits -Continue outpatient follow-up with cardiology service. -Follow electrolytes and maintain potassium above 4 and magnesium above 2.   Peripheral edema - Increased from baseline -Most likely associated with diet indiscretion -Low-sodium diet discussed with patient -On presentation found to be on A-fib with RVR; very likely contributing to underlying diastolic heart failure. -Repeat echo demonstrating left ventricle hypertrophy, hyperdynamic systolic function with ejection fraction 70 to 75% and indeterminate diastolic parameters. -Low-sodium diet, daily weights, leg elevation and continue diuresis has been recommended.   Mixed hyperlipidemia - Continue statin -Heart healthy diet discussed with patient.   Essential hypertension, benign -Stable and well-controlled -Continue current antihypertensive regimen -Low-sodium diet discussed with patient.   Class II obesity -Body mass index is 35.46 kg/m. -Low-calorie diet and portion control discussed with patient.  History of BPH -Continue patient follow-up with urology service -Continue the use of Flomax and nightly Ditropan.  Consultants: None Procedures performed: See below for x-ray reports. Disposition: Home Diet recommendation: Heart healthy/low-sodium and low calorie diet.  DISCHARGE MEDICATION: Allergies as of 12/23/2022       Reactions   Diovan [valsartan] Cough   Tagamet [cimetidine] Other (See Comments)   irrittability        Medication List     STOP taking these medications    diphenhydrAMINE 25 MG tablet Commonly known as: BENADRYL   metFORMIN 500 MG 24 hr tablet Commonly known as: GLUCOPHAGE-XR       TAKE these medications    apixaban 5 MG Tabs tablet Commonly known as: ELIQUIS Take 1 tablet (5 mg total) by mouth 2 (two) times daily.   ascorbic acid 500 MG tablet Commonly known as: VITAMIN C Take 500 mg by  mouth daily.   Fish Oil 1000 MG Caps Take 4 capsules by mouth daily.   furosemide 40 MG tablet Commonly known as: LASIX Take 1.5 tablets (60 mg total) by mouth daily. What changed: how much to take   loratadine 10 MG tablet Commonly known as: CLARITIN Take 10 mg by mouth daily as needed for allergies.   losartan 50 MG tablet Commonly known as: COZAAR Take 0.5 tablets (25 mg total) by mouth daily. What changed: how much to take   magnesium chloride 64 MG Tbec SR tablet Commonly known as: SLOW-MAG Take 1 tablet by mouth daily.   melatonin 5 MG Tabs Take 10 mg by mouth at bedtime.   metoprolol tartrate 25 MG tablet Commonly known as: LOPRESSOR Take 1 tablet (25 mg total) by mouth 2 (two) times daily.   Multivital tablet Take 1 tablet by mouth daily.   oxybutynin 5 MG tablet Commonly known as: DITROPAN Take 5 mg by mouth every evening.   pantoprazole 40 MG tablet Commonly known as: PROTONIX Take 40 mg by mouth daily.   potassium chloride 10 MEQ tablet Commonly known as: KLOR-CON Take 1 tablet (10 mEq total) by mouth daily.   rosuvastatin 20 MG tablet Commonly known as: CRESTOR Take 20 mg by mouth daily.   tamsulosin 0.4 MG Caps capsule Commonly known as: FLOMAX Take 0.4 mg by mouth daily.        Follow-up Information     Benita Stabile, MD. Schedule an appointment as soon as possible for a visit in 10 day(s).   Specialty:  Internal Medicine Contact information: [address] [place] [facility] [address] [phone]                Discharge Exam: Filed Weights   12/20/22 2218 12/21/22 1232 12/22/22 0500  Weight: 120.2 kg 119.1 kg 118.6 kg   General exam: Alert, awake, oriented x 3; no chest pain, no nausea, no vomiting.  Reports improvement in his breathing and feeling ready for discharge. Respiratory system: Improved air movement bilaterally; no using accessory muscle.  Good saturation on room air. Cardiovascular system: Rate controlled, no  rubs, no gallops, no JVD on exam.  (Limited evaluation secondary to body habitus). Gastrointestinal system: Abdomen is obese, nondistended, soft and nontender. No organomegaly or masses felt. Normal bowel sounds heard. Central nervous system: Alert and oriented. No focal neurological deficits. Extremities: No cyanosis or clubbing; 1+ edema bilaterally appreciated.  Positive stasis dermatitis suggesting presence of chronic swelling. Skin: No petechiae. Psychiatry: Judgement and insight appear normal. Mood & affect appropriate.    Condition at discharge: Stable and improved.  The results of significant diagnostics from this hospitalization (including imaging, microbiology, ancillary and laboratory) are listed below for reference.   Imaging Studies: ECHOCARDIOGRAM COMPLETE  Result Date: 12/21/2022    ECHOCARDIOGRAM REPORT   Patient Name:   Gary Carroll Date of Exam: 12/21/2022 Medical Rec #:  [id number]          Height:       69.0 in Accession #:    [id number]         Weight:       265.0 lb Date of Birth:  1946/04/20         BSA:          2.328 m Patient Age:    76 years           BP:           145/72 mmHg Patient Gender: M                  HR:           83 bpm. [name] Procedure: 2D Echo, Cardiac Doppler and Color Doppler Indications:    Congestive Heart Failure I50.9  History:        Patient has prior history of Echocardiogram examinations, most                 recent 02/04/2021. COPD, Arrythmias:Atrial Fibrillation; Risk                 Factors:Hypertension, Dyslipidemia and Current Smoker. Acute                 respiratory failure with hypoxia (HCC), Morbid Obesity.  Sonographer:    Celesta Gentile RCS Referring Phys: [id number] ASIA B ZIERLE-GHOSH IMPRESSIONS  1. Left ventricular ejection fraction, by estimation, is 60 to 65%. The left ventricle has normal function. The left ventricle has no regional wall motion abnormalities. There is moderate left ventricular hypertrophy. Left  ventricular diastolic parameters are indeterminate. Elevated left ventricular end-diastolic pressure.  2. Right ventricular systolic function is normal. The right ventricular size is normal. Tricuspid regurgitation signal is inadequate for assessing PA pressure.  3. Left atrial size was moderately dilated.  4. The mitral valve is abnormal. No evidence of mitral valve regurgitation. No evidence of mitral stenosis. Moderate to severe mitral annular calcification.  5. The aortic valve is tricuspid. There is severe calcifcation of the aortic valve. Aortic valve regurgitation is  mild. Severe aortic valve stenosis. Aortic valve area, by VTI measures 0.93 cm. Aortic valve mean gradient measures 41.0 mmHg. Aortic valve Vmax measures 4.19 m/s. DVI is 0.26.  6. The inferior vena cava is normal in size with <50% respiratory variability, suggesting right atrial pressure of 8 mmHg.  7. Increased flow velocities may be secondary to anemia, thyrotoxicosis, hyperdynamic or high flow state. Comparison(s): Changes from prior study are noted. AS progressed from moderate in 2022 to severe now. FINDINGS  Left Ventricle: Left ventricular ejection fraction, by estimation, is 60 to 65%. The left ventricle has normal function. The left ventricle has no regional wall motion abnormalities. Definity contrast agent was given IV to delineate the left ventricular  endocardial borders. The left ventricular internal cavity size was normal in size. There is moderate left ventricular hypertrophy. Left ventricular diastolic parameters are indeterminate. Elevated left ventricular end-diastolic pressure. Right Ventricle: The right ventricular size is normal. No increase in right ventricular wall thickness. Right ventricular systolic function is normal. Tricuspid regurgitation signal is inadequate for assessing PA pressure. Left Atrium: Left atrial size was moderately dilated. Right Atrium: Right atrial size was normal in size. Pericardium: There is no  evidence of pericardial effusion. Mitral Valve: The mitral valve is abnormal. Moderate to severe mitral annular calcification. No evidence of mitral valve regurgitation. No evidence of mitral valve stenosis. Tricuspid Valve: The tricuspid valve is normal in structure. Tricuspid valve regurgitation is not demonstrated. No evidence of tricuspid stenosis. Aortic Valve: The aortic valve is tricuspid. There is severe calcifcation of the aortic valve. Aortic valve regurgitation is mild. Aortic regurgitation PHT measures 351 msec. Severe aortic stenosis is present. Aortic valve mean gradient measures 41.0 mmHg. Aortic valve peak gradient measures 70.2 mmHg. Aortic valve area, by VTI measures 0.93 cm. Pulmonic Valve: The pulmonic valve was normal in structure. Pulmonic valve regurgitation is not visualized. No evidence of pulmonic stenosis. Aorta: The aortic root is normal in size and structure. Venous: The inferior vena cava is normal in size with less than 50% respiratory variability, suggesting right atrial pressure of 8 mmHg. IAS/Shunts: No atrial level shunt detected by color flow Doppler.  LEFT VENTRICLE PLAX 2D LVIDd:         5.10 cm   Diastology LVIDs:         2.70 cm   LV e' medial:    5.66 cm/s LV PW:         1.40 cm   LV E/e' medial:  20.1 LV IVS:        1.40 cm   LV e' lateral:   7.51 cm/s LVOT diam:     1.90 cm   LV E/e' lateral: 15.2 LV SV:         92 LV SV Index:   40 LVOT Area:     2.84 cm  RIGHT VENTRICLE RV S prime:     16.50 cm/s TAPSE (M-mode): 3.1 cm LEFT ATRIUM              Index        RIGHT ATRIUM           Index LA diam:        4.50 cm  1.93 cm/m   RA Area:     18.20 cm LA Vol (A2C):   72.0 ml  30.92 ml/m  RA Volume:   53.20 ml  22.85 ml/m LA Vol (A4C):   107.0 ml 45.95 ml/m LA Biplane Vol: 96.2 ml  41.31 ml/m  AORTIC VALVE AV Area (Vmax):    0.72 cm AV Area (Vmean):   0.78 cm AV Area (VTI):     0.93 cm AV Vmax:           419.00 cm/s AV Vmean:          281.250 cm/s AV VTI:             0.993 m AV Peak Grad:      70.2 mmHg AV Mean Grad:      41.0 mmHg LVOT Vmax:         107.00 cm/s LVOT Vmean:        77.800 cm/s LVOT VTI:          0.325 m LVOT/AV VTI ratio: 0.33 AI PHT:            351 msec  AORTA Ao Root diam: 3.20 cm MITRAL VALVE MV Area (PHT): 3.37 cm     SHUNTS MV Decel Time: 225 msec     Systemic VTI:  0.32 m MV E velocity: 114.00 cm/s  Systemic Diam: 1.90 cm MV A velocity: 94.60 cm/s MV E/A ratio:  1.21 Vishnu Priya Mallipeddi Electronically signed by Winfield Rast Mallipeddi Signature Date/Time: 12/21/2022/12:56:34 PM    Final    CT Angio Chest Pulmonary Embolism (PE) W or WO Contrast  Result Date: 12/21/2022 CLINICAL DATA:  Pulmonary embolism suspected, high probability. Weakness, chest pain, and shortness of breath. EXAM: CT ANGIOGRAPHY CHEST WITH CONTRAST TECHNIQUE: Multidetector CT imaging of the chest was performed using the standard protocol during bolus administration of intravenous contrast. Multiplanar CT image reconstructions and MIPs were obtained to evaluate the vascular anatomy. RADIATION DOSE REDUCTION: This exam was performed according to the departmental dose-optimization program which includes automated exposure control, adjustment of the mA and/or kV according to patient size and/or use of iterative reconstruction technique. CONTRAST:  OMNIPAQUE IOHEXOL 350 MG/ML SOLN COMPARISON:  07/25/2022, 02/02/2021. FINDINGS: Cardiovascular: The heart is enlarged and there is no pericardial effusion. Three-vessel coronary artery calcifications are noted. There is atherosclerotic calcification of the aorta without evidence of aneurysm. Pulmonary trunk is normal in caliber. No evidence of pulmonary embolism. Mediastinum/Nodes: Multiple enlarged lymph nodes are present in the mediastinum measuring up to 1.7 cm in the AP window and 1.4 cm in the subcarinal space. An enlarged lymph node is present at the right hilum. No axillary lymphadenopathy. The thyroid gland, trachea, and  esophagus are within normal limits. Lungs/Pleura: Bronchial wall thickening is noted in the upper and lower lobes on the right. There is interlobular septal thickening bilaterally. No effusion or pneumothorax. A stable consolidative opacity is noted in the apical segment of the left upper lobe measuring 4.8 x 4.4 x 1.7 cm, not significantly changed from the prior exam when differences in technique are taken into consideration. Nodular opacities are noted in the left upper lobe adjacent to this measuring up to 1 cm, axial image 23, unchanged. Few tree-in-bud nodular densities are noted in the posterior segment of the left upper lobe, not significantly changed from the prior exam. A few scattered ground-glass opacities are noted in the right lung. Upper Abdomen: The gallbladder is surgically absent. A stable nodule is noted in the left adrenal gland. No acute abnormality. Musculoskeletal: Degenerative changes are present in the thoracic spine. No acute osseous abnormality. Review of the MIP images confirms the above findings. IMPRESSION: 1. No evidence of pulmonary embolism. 2. Bronchial wall thickening on the right, which may be infectious or inflammatory. 3. A  few scattered ground-glass opacities and interlobular septal thickening bilaterally, suggesting edema. 4. Left upper lobe consolidated opacities with associated satellite nodules, not significantly changed from the prior exam. Continued surveillance is recommended. 5. Stable left adrenal nodule, unchanged from 2022. 6. Coronary artery calcifications. 7. Aortic atherosclerosis. Electronically Signed   By: Thornell Sartorius M.D.   On: 12/21/2022 03:37   DG Chest 2 View  Result Date: 12/20/2022 CLINICAL DATA:  Short of breath. EXAM: CHEST - 2 VIEW COMPARISON:  CT chest 07/25/2022, chest x-ray 02/05/2021 FINDINGS: The heart and mediastinal contours are within normal limits. Slightly increased left upper lobe interstitial marking. No focal consolidation. No  pulmonary edema. No pleural effusion. No pneumothorax. No acute osseous abnormality. IMPRESSION: Slightly increased left upper lobe interstitial marking. Finding may be due to rotation. No radiographic evidence of focal consolidation. Electronically Signed   By: Tish Frederickson M.D.   On: 12/20/2022 23:14    Microbiology: Results for orders placed or performed during the hospital encounter of 12/20/22  Resp panel by RT-PCR (RSV, Flu A&B, Covid) Anterior Nasal Swab     Status: None   Collection Time: 12/20/22 10:24 PM   Specimen: Anterior Nasal Swab  Result Value Ref Range Status   SARS Coronavirus 2 by RT PCR NEGATIVE NEGATIVE Final    Comment: (NOTE) SARS-CoV-2 target nucleic acids are NOT DETECTED.  The SARS-CoV-2 RNA is generally detectable in upper respiratory specimens during the acute phase of infection. The lowest concentration of SARS-CoV-2 viral copies this assay can detect is 138 copies/mL. A negative result does not preclude SARS-Cov-2 infection and should not be used as the sole basis for treatment or other patient management decisions. A negative result may occur with  improper specimen collection/handling, submission of specimen other than nasopharyngeal swab, presence of viral mutation(s) within the areas targeted by this assay, and inadequate number of viral copies(<138 copies/mL). A negative result must be combined with clinical observations, patient history, and epidemiological information. The expected result is Negative.  Fact Sheet for Patients:  BloggerCourse.com  Fact Sheet for Healthcare Providers:  SeriousBroker.it  This test is no t yet approved or cleared by the [place] FDA and  has been authorized for detection and/or diagnosis of SARS-CoV-2 by FDA under an Emergency Use Authorization (EUA). This EUA will remain  in effect (meaning this test can be used) for the duration of the COVID-19 declaration  under Section 564(b)(1) of the Act, 21 U.S.C.section 360bbb-3(b)(1), unless the authorization is terminated  or revoked sooner.       Influenza A by PCR NEGATIVE NEGATIVE Final   Influenza B by PCR NEGATIVE NEGATIVE Final    Comment: (NOTE) The Xpert Xpress SARS-CoV-2/FLU/RSV plus assay is intended as an aid in the diagnosis of influenza from Nasopharyngeal swab specimens and should not be used as a sole basis for treatment. Nasal washings and aspirates are unacceptable for Xpert Xpress SARS-CoV-2/FLU/RSV testing.  Fact Sheet for Patients: BloggerCourse.com  Fact Sheet for Healthcare Providers: SeriousBroker.it  This test is not yet approved or cleared by the [place] FDA and has been authorized for detection and/or diagnosis of SARS-CoV-2 by FDA under an Emergency Use Authorization (EUA). This EUA will remain in effect (meaning this test can be used) for the duration of the COVID-19 declaration under Section 564(b)(1) of the Act, 21 U.S.C. section 360bbb-3(b)(1), unless the authorization is terminated or revoked.     Resp Syncytial Virus by PCR NEGATIVE NEGATIVE Final    Comment: (NOTE) Fact Sheet for  Patients: BloggerCourse.com  Fact Sheet for Healthcare Providers: SeriousBroker.it  This test is not yet approved or cleared by the [place] FDA and has been authorized for detection and/or diagnosis of SARS-CoV-2 by FDA under an Emergency Use Authorization (EUA). This EUA will remain in effect (meaning this test can be used) for the duration of the COVID-19 declaration under Section 564(b)(1) of the Act, 21 U.S.C. section 360bbb-3(b)(1), unless the authorization is terminated or revoked.  Performed at [facility], [address], [place], [place] [address]     Labs: CBC: Recent Labs  Lab 12/20/22 2305 12/21/22 0514  WBC 7.6 6.8  NEUTROABS 4.9 4.6  HGB  11.9* 12.2*  HCT 37.0* 39.6  MCV 87.9 89.4  PLT 175 177   Basic Metabolic Panel: Recent Labs  Lab 12/20/22 2305 12/21/22 0514 12/22/22 0547  NA 137 138 138  K 3.8 4.2 4.0  CL 109 107 104  CO2 21* 22 26  GLUCOSE 172* 115* 126*  BUN 19 17 22   CREATININE 0.91 0.87 0.84  CALCIUM 8.3* 8.3* 8.7*  MG  --  2.2 2.1   Liver Function Tests: Recent Labs  Lab 12/20/22 2305 12/21/22 0514  AST 27 28  ALT 31 28  ALKPHOS 64 62  BILITOT 0.4 0.5  PROT 6.6 7.1  ALBUMIN 3.3* 3.4*   CBG: Recent Labs  Lab 12/21/22 1656 12/21/22 2026 12/22/22 0716 12/22/22 1129 12/22/22 1617  GLUCAP 193* 224* 119* 194* 131*    Discharge time spent: greater than 30 minutes.  Signed: Vassie Loll, MD [facility] 12/23/2022

## 2022-12-23 NOTE — Progress Notes (Signed)
Mobility Specialist Progress Note:    12/23/22 1140  Mobility  Activity Ambulated with assistance to bathroom;Transferred from bed to chair  Level of Assistance Moderate assist, patient does 50-74%  Assistive Device Front wheel walker  Distance Ambulated (ft) 15 ft  Range of Motion/Exercises Active;Right arm  Activity Response Tolerated well  Mobility Referral Yes  $Mobility charge 1 Mobility  Mobility Specialist Start Time (ACUTE ONLY) 1140  Mobility Specialist Stop Time (ACUTE ONLY) 1150  Mobility Specialist Time Calculation (min) (ACUTE ONLY) 10 min   Pt was received in bed, agreeable to mobility session. Pt requested assistance to BR, ModA to stand up and MinA while ambulating with RW. Returned pt from BR to chair. Tolerated well, asx throughout. Chair alarm on, call bell in reach. All needs met.   Lawerance Bach Mobility Specialist Please contact via Special educational needs teacher or  Rehab office at [phone]

## 2022-12-23 NOTE — Consult Note (Signed)
[facility] Customer service manager [facility]) Accountable Care Organization (ACO) [name] Liaison Note  12/23/2022  [name] [facility] 11-17-1945 [id number]  Location: [name] screened the patient remotely at [facility].  Insurance:  [employer]   Gary Carroll is a 77 y.o. male who is a Primary Care Patient of Margo Aye, Kathleene Hazel, MD. The patient was screened for  readmission hospitalization with noted low risk score for unplanned readmission risk with 1 IP in 6 months.  The patient was assessed for potential [facility]) Care Management service needs for post hospital transition for care coordination. Review of patient's electronic medical record reveals patient was admitted for SOB. Hospital liaison attempted outreach call to pt however unsuccessful. No anticipated needs. [facility] also [facility].   [facility] Care Management/Population Health does not replace or interfere with any arrangements made by the Inpatient Transition of Care team.   For questions contact:   Elliot Cousin, RN, [facility] Liaison [place]   Population Health Office Hours MTWF  8:00 am-6:00 pm Off on Thursday [phone] mobile [phone] [Office toll free line] Office Hours are M-F 8:30 - 5 pm 24 hour nurse advise line [phone] [email or web address]  [email or web address]

## 2022-12-23 NOTE — Progress Notes (Signed)
Patient discharge home today, discharge summary went over with patient by [facility]. Belongings sent home with patient.

## 2023-01-02 DIAGNOSIS — G4733 Obstructive sleep apnea (adult) (pediatric): Secondary | ICD-10-CM | POA: Diagnosis not present

## 2023-01-02 DIAGNOSIS — I482 Chronic atrial fibrillation, unspecified: Secondary | ICD-10-CM | POA: Diagnosis not present

## 2023-01-02 DIAGNOSIS — R21 Rash and other nonspecific skin eruption: Secondary | ICD-10-CM | POA: Diagnosis not present

## 2023-01-02 DIAGNOSIS — Z79899 Other long term (current) drug therapy: Secondary | ICD-10-CM | POA: Diagnosis not present

## 2023-01-02 DIAGNOSIS — I5042 Chronic combined systolic (congestive) and diastolic (congestive) heart failure: Secondary | ICD-10-CM | POA: Diagnosis not present

## 2023-01-02 DIAGNOSIS — I350 Nonrheumatic aortic (valve) stenosis: Secondary | ICD-10-CM | POA: Diagnosis not present

## 2023-01-10 ENCOUNTER — Encounter (HOSPITAL_COMMUNITY)
Admission: RE | Admit: 2023-01-10 | Discharge: 2023-01-10 | Disposition: A | Discharge status: Home / Self Care | Payer: No Typology Code available for payment source | Source: Ambulatory Visit | Attending: Internal Medicine | Admitting: Internal Medicine

## 2023-01-10 ENCOUNTER — Encounter (HOSPITAL_COMMUNITY): Payer: Self-pay

## 2023-01-13 ENCOUNTER — Encounter (HOSPITAL_COMMUNITY)
Admission: RE | Disposition: A | Discharge status: Home / Self Care | Payer: Self-pay | Source: Home / Self Care | Attending: Internal Medicine

## 2023-01-13 ENCOUNTER — Other Ambulatory Visit: Payer: Self-pay

## 2023-01-13 ENCOUNTER — Ambulatory Visit (HOSPITAL_COMMUNITY): Payer: No Typology Code available for payment source | Admitting: Certified Registered"

## 2023-01-13 ENCOUNTER — Ambulatory Visit (HOSPITAL_COMMUNITY)
Admission: RE | Admit: 2023-01-13 | Discharge: 2023-01-13 | Disposition: A | Discharge status: Home / Self Care | Payer: No Typology Code available for payment source | Attending: Internal Medicine | Admitting: Internal Medicine

## 2023-01-13 DIAGNOSIS — I10 Essential (primary) hypertension: Secondary | ICD-10-CM

## 2023-01-13 DIAGNOSIS — Z87891 Personal history of nicotine dependence: Secondary | ICD-10-CM

## 2023-01-13 DIAGNOSIS — K625 Hemorrhage of anus and rectum: Secondary | ICD-10-CM | POA: Insufficient documentation

## 2023-01-13 DIAGNOSIS — K5730 Diverticulosis of large intestine without perforation or abscess without bleeding: Secondary | ICD-10-CM

## 2023-01-13 DIAGNOSIS — K5669 Other partial intestinal obstruction: Secondary | ICD-10-CM | POA: Insufficient documentation

## 2023-01-13 DIAGNOSIS — I480 Paroxysmal atrial fibrillation: Secondary | ICD-10-CM | POA: Diagnosis not present

## 2023-01-13 DIAGNOSIS — C20 Malignant neoplasm of rectum: Secondary | ICD-10-CM | POA: Diagnosis not present

## 2023-01-13 DIAGNOSIS — C189 Malignant neoplasm of colon, unspecified: Secondary | ICD-10-CM | POA: Diagnosis not present

## 2023-01-13 HISTORY — PX: COLONOSCOPY WITH PROPOFOL: SHX5780

## 2023-01-13 LAB — GLUCOSE, CAPILLARY: Glucose-Capillary: 89 mg/dL (ref 70–99)

## 2023-01-13 SURGERY — COLONOSCOPY WITH PROPOFOL
Anesthesia: Monitor Anesthesia Care

## 2023-01-13 MED ORDER — PROPOFOL 500 MG/50ML IV EMUL
INTRAVENOUS | Status: DC | PRN
  Administered 2023-01-13: 150 ug/kg/min via INTRAVENOUS

## 2023-01-13 MED ORDER — PROPOFOL 500 MG/50ML IV EMUL
INTRAVENOUS | Status: AC
  Filled 2023-01-13: qty 50

## 2023-01-13 MED ORDER — EPHEDRINE 5 MG/ML INJ
INTRAVENOUS | Status: AC
  Filled 2023-01-13: qty 5

## 2023-01-13 MED ORDER — PROPOFOL 10 MG/ML IV BOLUS
INTRAVENOUS | Status: DC | PRN
Start: 2023-01-13 — End: 2023-01-13
  Administered 2023-01-13: 80 mg via INTRAVENOUS

## 2023-01-13 MED ORDER — LACTATED RINGERS IV SOLN: INTRAVENOUS | Status: DC | PRN

## 2023-01-13 MED ORDER — PHENYLEPHRINE 80 MCG/ML (10ML) SYRINGE FOR IV PUSH (FOR BLOOD PRESSURE SUPPORT)
PREFILLED_SYRINGE | INTRAVENOUS | Status: DC | PRN
  Administered 2023-01-13: 80 ug via INTRAVENOUS
  Administered 2023-01-13 (×2): 160 ug via INTRAVENOUS

## 2023-01-13 MED ORDER — LIDOCAINE HCL (CARDIAC) PF 100 MG/5ML IV SOSY
PREFILLED_SYRINGE | INTRAVENOUS | Status: DC | PRN
  Administered 2023-01-13: 80 mg via INTRAVENOUS

## 2023-01-13 MED ORDER — PHENYLEPHRINE 80 MCG/ML (10ML) SYRINGE FOR IV PUSH (FOR BLOOD PRESSURE SUPPORT)
PREFILLED_SYRINGE | INTRAVENOUS | Status: AC
  Filled 2023-01-13: qty 10

## 2023-01-13 NOTE — Anesthesia Preprocedure Evaluation (Signed)
Anesthesia Evaluation  Patient identified by MRN, date of birth, ID band Patient awake    Reviewed: Allergy & Precautions, NPO status , Patient's Chart, lab work & pertinent test results  Airway Mallampati: III  TM Distance: >3 FB Neck ROM: Full    Dental  (+) Dental Advisory Given   Pulmonary asthma , sleep apnea and Continuous Positive Airway Pressure Ventilation , former smoker   Pulmonary exam normal breath sounds clear to auscultation       Cardiovascular Exercise Tolerance: Good hypertension, Pt. on medications Normal cardiovascular exam+ dysrhythmias Atrial Fibrillation + Valvular Problems/Murmurs  Rhythm:Regular Rate:Normal     Neuro/Psych  PSYCHIATRIC DISORDERS       Neuromuscular disease    GI/Hepatic Neg liver ROS, hiatal hernia,GERD  Medicated,,  Endo/Other  negative endocrine ROS    Renal/GU Renal disease  negative genitourinary   Musculoskeletal  (+) Arthritis , Osteoarthritis,    Abdominal   Peds negative pediatric ROS (+)  Hematology  (+) Blood dyscrasia, anemia   Anesthesia Other Findings   Reproductive/Obstetrics negative OB ROS                              Anesthesia Physical Anesthesia Plan  ASA: 3  Anesthesia Plan: MAC   Post-op Pain Management:    Induction:   PONV Risk Score and Plan:   Airway Management Planned: Nasal Cannula and Natural Airway  Additional Equipment:   Intra-op Plan:   Post-operative Plan:   Informed Consent: I have reviewed the patients History and Physical, chart, labs and discussed the procedure including the risks, benefits and alternatives for the proposed anesthesia with the patient or authorized representative who has indicated his/her understanding and acceptance.       Plan Discussed with: CRNA and Surgeon  Anesthesia Plan Comments:          Anesthesia Quick Evaluation

## 2023-01-13 NOTE — Op Note (Addendum)
[facility] Patient Name: Gary Carroll Procedure Date: 01/13/2023 2:07 PM MRN: [id number] Date of Birth: [birth year] Attending MD: Hennie Duos. Marletta Lor , [place], [id number] CSN: [id number] Age: 77 Admit Type: Outpatient Procedure:                Colonoscopy Indications:              Rectal bleeding, Rectal pain Providers:                Hennie Duos. Marletta Lor, DO, Buel Ream. Thomasena Edis RN, RN,                            Dyann Ruddle, Elinor Parkinson Referring MD:              Medicines:                See the Anesthesia note for documentation of the                            administered medications Complications:            No immediate complications. Estimated Blood Loss:     Estimated blood loss was minimal. Procedure:                Pre-Anesthesia Assessment:                           - The anesthesia plan was to use monitored                            anesthesia care (MAC).                           After obtaining informed consent, the colonoscope                            was passed under direct vision. Throughout the                            procedure, the patient's blood pressure, pulse, and                            oxygen saturations were monitored continuously. The                            [id number]-HQ190L ([id number]) scope was introduced through                            the anus and advanced to the the cecum, identified                            by appendiceal orifice and ileocecal valve. The                            colonoscopy was performed without difficulty. The                            patient tolerated the  procedure well. The quality                            of the bowel preparation was evaluated using the                            BBPS [facility] Bowel Preparation Scale) with scores                            of: Right Colon = 1 (portion of mucosa seen, but                            other areas not well seen due to staining, residual                             stool and/or opaque liquid), Transverse Colon = 1                            (portion of mucosa seen, but other areas not well                            seen due to staining, residual stool and/or opaque                            liquid) and Left Colon = 1 (portion of mucosa seen,                            but other areas not well seen due to staining,                            residual stool and/or opaque liquid). The total                            BBPS score equals 3. The quality of the bowel                            preparation was inadequate. Scope In: 2:22:10 PM Scope Out: 2:39:42 PM Scope Withdrawal Time: 0 hours 8 minutes 57 seconds  Total Procedure Duration: 0 hours 17 minutes 32 seconds  Findings:      The digital rectal exam revealed a rectal mass.      A frond-like/villous, fungating and infiltrative partially obstructing       mass was found in the rectum. The mass measured approx 3-4 cm in length.       No bleeding was present. Biopsies were taken with a cold forceps for       histology.      Extensive amounts of stool was found in the entire colon, precluding       visualization. Lavage of the area was performed using copious amounts of       sterile water, resulting in incomplete clearance with continued poor       visualization. Impression:               - Preparation of the colon  was inadequate.                           - Rectal mass.                           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 Biopsied.                           - Stool in the entire examined colon. Moderate Sedation:      Per Anesthesia Care Recommendation:           - Patient has a contact number available for                            emergencies. The signs and symptoms of potential                            delayed complications were discussed with the                            patient. Return to normal activities tomorrow.                             Written discharge instructions were provided to the                            patient.                           - Resume previous diet.                           - Continue present medications.                           - Await pathology results.                           - Will send reports to patient's oncologist at the                            [facility]. Patient told to call my office if he does not                            hear from them by the end of the week. Procedure Code(s):        --- Professional ---                           [phone], Colonoscopy, flexible; with biopsy, single                            or multiple Diagnosis Code(s):        ---  Professional ---                           K62.89, Other specified diseases of anus and rectum                           D49.0, Neoplasm of unspecified behavior of                            digestive system                           K56.690, Other partial intestinal obstruction                           K62.5, Hemorrhage of anus and rectum CPT copyright 2022 American Medical Association. All rights reserved. The codes documented in this report are preliminary and upon coder review may  be revised to meet current compliance requirements. Hennie Duos. Marletta Lor, DO Hennie Duos. Marletta Lor, DO 01/13/2023 2:46:56 PM This report has been signed electronically. Number of Addenda: 0

## 2023-01-13 NOTE — Discharge Instructions (Addendum)
  Colonoscopy Discharge Instructions  Read the instructions outlined below and refer to this sheet in the next few weeks. These discharge instructions provide you with general information on caring for yourself after you leave the hospital. Your doctor may also give you specific instructions. While your treatment has been planned according to the most current medical practices available, unavoidable complications occasionally occur.   ACTIVITY You may resume your regular activity, but move at a slower pace for the next 24 hours.  Take frequent rest periods for the next 24 hours.  Walking will help get rid of the air and reduce the bloated feeling in your belly (abdomen).  No driving for 24 hours (because of the medicine (anesthesia) used during the test).   Do not sign any important legal documents or operate any machinery for 24 hours (because of the anesthesia used during the test).  NUTRITION Drink plenty of fluids.  You may resume your normal diet as instructed by your doctor.  Begin with a light meal and progress to your normal diet. Heavy or fried foods are harder to digest and may make you feel sick to your stomach (nauseated).  Avoid alcoholic beverages for 24 hours or as instructed.  MEDICATIONS You may resume your normal medications unless your doctor tells you otherwise.  WHAT YOU CAN EXPECT TODAY Some feelings of bloating in the abdomen.  Passage of more gas than usual.  Spotting of blood in your stool or on the toilet paper.  IF YOU HAD POLYPS REMOVED DURING THE COLONOSCOPY: No aspirin products for 7 days or as instructed.  No alcohol for 7 days or as instructed.  Eat a soft diet for the next 24 hours.  FINDING OUT THE RESULTS OF YOUR TEST Not all test results are available during your visit. If your test results are not back during the visit, make an appointment with your caregiver to find out the results. Do not assume everything is normal if you have not heard from your  caregiver or the medical facility. It is important for you to follow up on all of your test results.  SEEK IMMEDIATE MEDICAL ATTENTION IF: You have more than a spotting of blood in your stool.  Your belly is swollen (abdominal distention).  You are nauseated or vomiting.  You have a temperature over 101.  You have abdominal pain or discomfort that is severe or gets worse throughout the day.   Your colonoscopy revealed a mass in your rectum concerning for rectal cancer.  I took numerous biopsies of this.  We will call with results in a few days.  I am going to refer you to oncology to discuss further imaging and possible treatment options.  Hold Eliquis.  Start retaking Eliquis in two days, Wednesday, August 28.    I hope you have a great rest of your week!  Hennie Duos. Marletta Lor, D.O. Gastroenterology and Hepatology [facility]

## 2023-01-13 NOTE — Transfer of Care (Addendum)
Immediate Anesthesia Transfer of Care Note  Patient: Gary Carroll  Procedure(s) Performed: COLONOSCOPY WITH PROPOFOL  Patient Location: Short Stay  Anesthesia Type:General  Level of Consciousness: drowsy and patient cooperative  Airway & Oxygen Therapy: Patient Spontanous Breathing and Patient connected to nasal cannula oxygen  Post-op Assessment: Report given to RN and Post -op Vital signs reviewed and stable  Post vital signs: Reviewed and stable  Last Vitals:  Vitals Value Taken Time  BP 118/55 01/13/23   1440  Temp 36.4 01/13/23   1440  Pulse 82 01/13/23   1440  Resp 22 01/13/23   1440  SpO2 92% 01/13/23   1440    Last Pain:  Vitals:   01/13/23 1415  TempSrc:   PainSc: 5       Patients Stated Pain Goal: 5 (01/13/23 1219)  Complications: No notable events documented.

## 2023-01-13 NOTE — Progress Notes (Signed)
Upon being discharged, patient notified post-op nurse that he didn't have anyone to stay in his apartment with him for 24 hours.  He stated that his neighbor, Gary Carroll, lived next door and would check on him.  Gary Carroll agreed to take patient to get something to eat and then she would stay with him for several hours to make sure he was ok. Ms. Gary Carroll expressed concern about pt's ability to climb the stairs to his apartment.  Patient was advised to call EMS if he couldn't safely climb the stairs.

## 2023-01-13 NOTE — H&P (Signed)
Primary Care Physician:  Benita Stabile, MD Primary Gastroenterologist:  Dr. Marletta Lor  Pre-Procedure History & Physical: HPI:  Gary Carroll is a 77 y.o. male is here for a colonoscopy for rectal pain and bleeding  Past Medical History:  Diagnosis Date   A-fib [facility])    Allergic rhinitis    Arthritis    Asthma    seasonal per pt   Bilateral knee pain    OA   Diverticulosis 02/2012   seen on colonoscopy   Erectile dysfunction    GERD (gastroesophageal reflux disease)    Heart murmur    Hiatal hernia    HLD (hyperlipidemia)    HTN (hypertension)    Hydronephrosis determined by ultrasound 08/22/2015   Overactive bladder    Prediabetes    Pulmonary lesion    Ringing of ears    Sleep apnea    Urinary frequency    Urinary urgency    Wears glasses     Past Surgical History:  Procedure Laterality Date   CATARACT EXTRACTION W/PHACO Left 12/07/2021   Procedure: CATARACT EXTRACTION PHACO AND INTRAOCULAR LENS PLACEMENT (IOC);  Surgeon: Fabio Pierce, MD;  Location: [facility];  Service: Ophthalmology;  Laterality: Left;  CDE 7.75   CATARACT EXTRACTION W/PHACO Right 12/21/2021   Procedure: CATARACT EXTRACTION PHACO AND INTRAOCULAR LENS PLACEMENT (IOC);  Surgeon: Fabio Pierce, MD;  Location: [facility];  Service: Ophthalmology;  Laterality: Right;  CDE: 8.00   CHOLECYSTECTOMY     COLONOSCOPY  02/2012   Dr. 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 Authorizing Provider  furosemide (LASIX) 40 MG tablet Take 1.5 tablets (60 mg total) by mouth daily. 12/23/22   Yes Vassie Loll, MD  loratadine (CLARITIN) 10 MG tablet Take 10 mg by mouth daily as needed for allergies.   Yes [provider]  losartan (COZAAR) 50 MG tablet Take 0.5 tablets (25 mg total) by mouth daily. 12/23/22  Yes Vassie Loll, MD  magnesium chloride (SLOW-MAG) 64 MG TBEC SR tablet Take 1 tablet by mouth daily.   Yes [provider]  Melatonin 5 MG TABS Take 10 mg by mouth at bedtime.    Yes [provider]  metoprolol tartrate (LOPRESSOR) 25 MG tablet Take 1 tablet (25 mg total) by mouth 2 (two) times daily. 08/24/15  Yes Philip Aspen, Limmie Patricia, MD  oxybutynin (DITROPAN) 5 MG tablet Take 5 mg by mouth every evening. 06/01/19  Yes [provider]  pantoprazole (PROTONIX) 40 MG tablet Take 40 mg by mouth daily.   Yes [provider]  potassium chloride (K-DUR) 10 MEQ tablet Take 1 tablet (10 mEq total) by mouth daily. 10/09/17  Yes Erick Blinks, MD  rosuvastatin (CRESTOR) 20 MG tablet Take 20 mg by mouth daily.   Yes [provider]  tamsulosin (FLOMAX) 0.4 MG CAPS capsule Take 0.4 mg by mouth daily.   Yes [provider]  vitamin C (ASCORBIC ACID) 500 MG tablet Take 500 mg by mouth daily.   Yes [provider]  apixaban (ELIQUIS) 5 MG TABS tablet Take 1 tablet (5 mg total) by mouth 2 (two) times daily. 08/24/15   Philip Aspen, Limmie Patricia, MD  Multiple Vitamins-Minerals (MULTIVITAL) tablet Take 1 tablet by mouth daily.    [provider]  Omega-3 Fatty Acids (FISH OIL) 1000 MG CAPS Take 4 capsules by mouth daily.     [provider]    Allergies as of 12/13/2022 - Review Complete 12/04/2022  Allergen Reaction Noted   Diovan [valsartan] Cough 12/20/2010   Tagamet [cimetidine] Other (See Comments)     Family History  Problem Relation Age of Onset   Uterine cancer Mother    Cancer Mother        uterine   Coronary artery disease Father    Coronary artery disease Paternal Grandfather     Heart disease Paternal Grandfather    Colon cancer Neg Hx    Esophageal cancer Neg Hx    Stomach cancer Neg Hx    Rectal cancer Neg Hx    Liver disease Neg Hx     Social History   Socioeconomic History   Marital status: Divorced    Spouse name: Not on file   Number of children: 0   Years of education: Not on file   Highest education level: Not on file  Occupational History   Occupation: retired Lobbyist)    Employer: RETIRED  Tobacco Use   Smoking status: Former    Current packs/day: 0.00    Types: Cigarettes    Quit date: 01/18/2010    Years since quitting: 12.9   Smokeless tobacco: Never   Tobacco comments:    Smoked for 25 years- quit September 2011.  Passive exposure from wife  Vaping Use   Vaping status: Never Used  Substance and Sexual Activity   Alcohol use: Not Currently    Alcohol/week: 1.0 - 2.0 standard drink of alcohol    Types: 1 - 2 Glasses of wine per week   Drug use: No   Sexual activity: Not on file  Other Topics Concern   Not on file  Social History Narrative   Separated from wife (2014) and moved to [place]. Retired- worked in Production designer, theatre/television/film.    Social Determinants of Health   Financial Resource Strain: Not on file  Food Insecurity: No Food Insecurity (12/21/2022)   Hunger Vital Sign    Worried About Running Out of Food in the Last Year: Never true    Ran Out of Food in the Last Year: Never true  Transportation Needs: No Transportation Needs (12/21/2022)   PRAPARE - Administrator, Civil Service (Medical): No    Lack of Transportation (Non-Medical): No  Physical Activity: Not on file  Stress: Not on file  Social Connections: Not on file  Intimate Partner Violence: Not At Risk (12/21/2022)   Humiliation, Afraid, Rape, and Kick questionnaire    Fear of Current or Ex-Partner: No    Emotionally Abused: No    Physically Abused: No    Sexually Abused: No    Review of Systems: See HPI, otherwise negative ROS  Physical Exam: Vital  signs in last 24 hours: Temp:  [98 F (36.7 C)] 98 F (36.7 C) (08/26 1219) Pulse Rate:  [75] 75 (08/26 1219) Resp:  [18] 18 (08/26 1219) BP: (147)/(64) 147/64 (08/26 1219) SpO2:  [98 %] 98 % (08/26 1219)   General:   Alert,  Well-developed, well-nourished, pleasant and cooperative in NAD Head:  Normocephalic and atraumatic. Eyes:  Sclera  clear, no icterus.   Conjunctiva pink. Ears:  Normal auditory acuity. Nose:  No deformity, discharge,  or lesions. Msk:  Symmetrical without gross deformities. Normal posture. Extremities:  Without clubbing or edema. Neurologic:  Alert and  oriented x4;  grossly normal neurologically. Skin:  Intact without significant lesions or rashes. Psych:  Alert and cooperative. Normal mood and affect.  Impression/Plan: [name] is here for a colonoscopy to be performed for rectal pain and bleeding  The risks of the procedure including infection, bleed, or perforation as well as benefits, limitations, alternatives and imponderables have been reviewed with the patient. Questions have been answered. All parties agreeable.

## 2023-01-14 ENCOUNTER — Telehealth: Payer: Self-pay | Admitting: *Deleted

## 2023-01-14 NOTE — Telephone Encounter (Signed)
[facility] at [phone]. Was then transferred to oncology. Was advised the ext for Dr. Abran Duke nurse was [phone] and then I was transferred. LM for her to call me back in regards to patient. I have also faxed note to them at [phone].

## 2023-01-14 NOTE — Telephone Encounter (Signed)
Per Dr. Marletta Lor "Did his colonoscopy today. He has a rectal mass concerning for rectal cancer. Took biopsies. He is established with the [facility] oncologist Dr. Susann Givens. Can we reach out to their office? Will need to fax over colonoscopy report and pathology report when it becomes available".

## 2023-01-14 NOTE — Telephone Encounter (Signed)
I called onocology and received VM again.   I spoke with pt. He stated [facility] and he actually has to go through his PCP 1st through the [place] and then they will send referral. He has an appointment next week.

## 2023-01-15 NOTE — Telephone Encounter (Signed)
Received call from [facility] with Oncology. She confirmed patient will have to be referred to them from the PCP at the [place]. She is aware he does have a pending appt.

## 2023-01-16 ENCOUNTER — Encounter (HOSPITAL_COMMUNITY): Payer: Self-pay | Admitting: Internal Medicine

## 2023-01-17 ENCOUNTER — Encounter (HOSPITAL_COMMUNITY): Payer: Self-pay

## 2023-01-17 ENCOUNTER — Emergency Department (HOSPITAL_COMMUNITY)
Admission: EM | Admit: 2023-01-17 | Discharge: 2023-01-18 | Disposition: A | Discharge status: Home / Self Care | Payer: No Typology Code available for payment source | Attending: Emergency Medicine | Admitting: Emergency Medicine

## 2023-01-17 ENCOUNTER — Other Ambulatory Visit: Payer: Self-pay

## 2023-01-17 ENCOUNTER — Emergency Department (HOSPITAL_COMMUNITY): Payer: No Typology Code available for payment source

## 2023-01-17 DIAGNOSIS — I4891 Unspecified atrial fibrillation: Secondary | ICD-10-CM

## 2023-01-17 DIAGNOSIS — Z7901 Long term (current) use of anticoagulants: Secondary | ICD-10-CM | POA: Diagnosis not present

## 2023-01-17 DIAGNOSIS — R600 Localized edema: Secondary | ICD-10-CM | POA: Insufficient documentation

## 2023-01-17 DIAGNOSIS — R000 Tachycardia, unspecified: Secondary | ICD-10-CM | POA: Insufficient documentation

## 2023-01-17 DIAGNOSIS — D649 Anemia, unspecified: Secondary | ICD-10-CM

## 2023-01-17 DIAGNOSIS — Z79899 Other long term (current) drug therapy: Secondary | ICD-10-CM | POA: Diagnosis not present

## 2023-01-17 LAB — COMPREHENSIVE METABOLIC PANEL
ALT: 33 U/L (ref 0–44)
AST: 32 U/L (ref 15–41)
Albumin: 3.3 g/dL — ABNORMAL LOW (ref 3.5–5.0)
Alkaline Phosphatase: 62 U/L (ref 38–126)
Anion gap: 10 (ref 5–15)
BUN: 18 mg/dL (ref 8–23)
CO2: 21 mmol/L — ABNORMAL LOW (ref 22–32)
Calcium: 8.7 mg/dL — ABNORMAL LOW (ref 8.9–10.3)
Chloride: 109 mmol/L (ref 98–111)
Creatinine, Ser: 0.89 mg/dL (ref 0.61–1.24)
GFR, Estimated: >60 mL/min (ref >60)
Glucose, Bld: 178 mg/dL — ABNORMAL HIGH (ref 70–99)
Potassium: 3.9 mmol/L (ref 3.5–5.1)
Sodium: 140 mmol/L (ref 135–145)
Total Bilirubin: 0.6 mg/dL (ref 0.3–1.2)
Total Protein: 6.4 g/dL — ABNORMAL LOW (ref 6.5–8.1)

## 2023-01-17 LAB — URINALYSIS, ROUTINE W REFLEX MICROSCOPIC
Bacteria, UA: NONE SEEN
Bilirubin Urine: NEGATIVE
Glucose, UA: NEGATIVE mg/dL
Hgb urine dipstick: NEGATIVE
Ketones, ur: NEGATIVE mg/dL
Nitrite: NEGATIVE
Protein, ur: NEGATIVE mg/dL
Specific Gravity, Urine: 1.032 — ABNORMAL HIGH (ref 1.005–1.030)
WBC, UA: >50 WBC/hpf (ref 0–5)
pH: 5 (ref 5.0–8.0)

## 2023-01-17 LAB — CBC WITH DIFFERENTIAL/PLATELET
Abs Immature Granulocytes: 0.02 10*3/uL (ref 0.00–0.07)
Basophils Absolute: 0 10*3/uL (ref 0.0–0.1)
Basophils Relative: 0 %
Eosinophils Absolute: 0.2 10*3/uL (ref 0.0–0.5)
Eosinophils Relative: 3 %
HCT: 38.1 % — ABNORMAL LOW (ref 39.0–52.0)
Hemoglobin: 11.9 g/dL — ABNORMAL LOW (ref 13.0–17.0)
Immature Granulocytes: 0 %
Lymphocytes Relative: 25 %
Lymphs Abs: 1.7 10*3/uL (ref 0.7–4.0)
MCH: 27.7 pg (ref 26.0–34.0)
MCHC: 31.2 g/dL (ref 30.0–36.0)
MCV: 88.6 fL (ref 80.0–100.0)
Monocytes Absolute: 0.8 10*3/uL (ref 0.1–1.0)
Monocytes Relative: 11 %
Neutro Abs: 4.2 10*3/uL (ref 1.7–7.7)
Neutrophils Relative %: 61 %
Platelets: 197 10*3/uL (ref 150–400)
RBC: 4.3 MIL/uL (ref 4.22–5.81)
RDW: 15.9 % — ABNORMAL HIGH (ref 11.5–15.5)
WBC: 6.9 10*3/uL (ref 4.0–10.5)
nRBC: 0 % (ref 0.0–0.2)

## 2023-01-17 MED ORDER — METOPROLOL TARTRATE 25 MG PO TABS
25.0000 mg | ORAL_TABLET | Freq: Once | ORAL | Status: AC
  Administered 2023-01-17: 25 mg via ORAL
  Filled 2023-01-17: qty 1

## 2023-01-17 MED ORDER — METOPROLOL TARTRATE 5 MG/5ML IV SOLN
5.0000 mg | Freq: Once | INTRAVENOUS | Status: AC
  Administered 2023-01-17: 5 mg via INTRAVENOUS
  Filled 2023-01-17: qty 5

## 2023-01-17 MED ORDER — IOHEXOL 350 MG/ML SOLN
100.0000 mL | Freq: Once | INTRAVENOUS | Status: AC | PRN
  Administered 2023-01-17: 100 mL via INTRAVENOUS

## 2023-01-17 NOTE — Anesthesia Postprocedure Evaluation (Signed)
Anesthesia Post Note  Patient: Gary Carroll  Procedure(s) Performed: COLONOSCOPY WITH PROPOFOL  Patient location during evaluation: Phase II Anesthesia Type: MAC Level of consciousness: awake Pain management: pain level controlled Vital Signs Assessment: post-procedure vital signs reviewed and stable Respiratory status: spontaneous breathing and respiratory function stable Cardiovascular status: blood pressure returned to baseline and stable Postop Assessment: no headache and no apparent nausea or vomiting Anesthetic complications: no Comments: Late entry   No notable events documented.   Last Vitals:  Vitals:   01/13/23 1440 01/13/23 1457  BP: (!) 118/55   Pulse: 83   Resp: (!) 22   Temp: (!) 36.4 C   SpO2: 92% 95%    Last Pain:  Vitals:   01/14/23 1523  TempSrc:   PainSc: 0-No pain                 Windell Norfolk

## 2023-01-17 NOTE — ED Notes (Signed)
Urinal given to pt and is at bedside Pt states he in& out self at home before coming to the ED

## 2023-01-17 NOTE — ED Triage Notes (Signed)
Pt stated he has a hx of AFIB HR today was 115 Usually in the 60's Took metoprolol and eliquis today  Complains of buttocks pain, hx of colon CA Increased urination  Feeling weak  Denies fevers

## 2023-01-17 NOTE — ED Provider Notes (Signed)
[place] EMERGENCY DEPARTMENT AT [facility] Provider Note   CSN: [id number] Arrival date & time: 01/17/23  0865     History  Chief Complaint  Patient presents with   Irregular Heart Beat    Gary Carroll is a 77 y.o. male.  77 year old male presenting emergency department for elevated heart rate.  States that he was taking his blood pressure at home and noted that his heart rate was elevated.  Reportedly has a history of A-fib taking metoprolol and Eliquis.  Also notes that he was recently diagnosed with colon cancer.  Had an echo done recently as well and told that he had a leaky valve.  He has not missed any doses of his metoprolol.  He states that he is typically not able to tell when he goes into A-fib and he has not checked his heart rate for the past several days.  He does note that he is felt generally weak for close to the past week.  No fevers no chills.  No chest pain some minor shortness of breath.        Home Medications Prior to Admission medications   Medication Sig Start Date End Date Taking? Authorizing Provider  apixaban (ELIQUIS) 5 MG TABS tablet Take 1 tablet (5 mg total) by mouth 2 (two) times daily. 08/24/15   Philip Aspen, Limmie Patricia, MD  furosemide (LASIX) 40 MG tablet Take 1.5 tablets (60 mg total) by mouth daily. 12/23/22   Vassie Loll, MD  loratadine (CLARITIN) 10 MG tablet Take 10 mg by mouth daily as needed for allergies.    [provider]  losartan (COZAAR) 50 MG tablet Take 0.5 tablets (25 mg total) by mouth daily. 12/23/22   Vassie Loll, MD  magnesium chloride (SLOW-MAG) 64 MG TBEC SR tablet Take 1 tablet by mouth daily.    [provider]  Melatonin 5 MG TABS Take 10 mg by mouth at bedtime.     [provider]  metoprolol tartrate (LOPRESSOR) 25 MG tablet Take 1 tablet (25 mg total) by mouth 2 (two) times daily. 08/24/15   Philip Aspen, Limmie Patricia, MD  Multiple Vitamins-Minerals (MULTIVITAL) tablet Take  1 tablet by mouth daily.    [provider]  Omega-3 Fatty Acids (FISH OIL) 1000 MG CAPS Take 4 capsules by mouth daily.     [provider]  oxybutynin (DITROPAN) 5 MG tablet Take 5 mg by mouth every evening. 06/01/19   [provider]  pantoprazole (PROTONIX) 40 MG tablet Take 40 mg by mouth daily.    [provider]  potassium chloride (K-DUR) 10 MEQ tablet Take 1 tablet (10 mEq total) by mouth daily. 10/09/17   Erick Blinks, MD  rosuvastatin (CRESTOR) 20 MG tablet Take 20 mg by mouth daily.    [provider]  tamsulosin (FLOMAX) 0.4 MG CAPS capsule Take 0.4 mg by mouth daily.    [provider]  vitamin C (ASCORBIC ACID) 500 MG tablet Take 500 mg by mouth daily.    [provider]      Allergies    Diovan [valsartan] and Tagamet [cimetidine]    Review of Systems   Review of Systems  Physical Exam Updated Vital Signs BP 124/77   Pulse (!) 111   Temp 99.1 F (37.3 C) (Oral)   Resp (!) 28   Ht 5\' 11"  (1.803 m)   Wt 117.9 kg   SpO2 95%   BMI 36.26 kg/m  Physical Exam Vitals and  nursing note reviewed.  Constitutional:      General: He is not in acute distress.    Appearance: He is not toxic-appearing.  HENT:     Head: Normocephalic.     Nose: Nose normal.     Mouth/Throat:     Mouth: Mucous membranes are moist.  Eyes:     Conjunctiva/sclera: Conjunctivae normal.  Cardiovascular:     Rate and Rhythm: Tachycardia present. Rhythm irregular.  Pulmonary:     Effort: Pulmonary effort is normal.     Breath sounds: Normal breath sounds.  Abdominal:     General: Abdomen is flat. There is no distension.     Tenderness: There is no abdominal tenderness. There is no guarding or rebound.  Musculoskeletal:        General: Normal range of motion.     Right lower leg: Edema present.     Left lower leg: Edema present.  Skin:    General: Skin is warm and dry.     Capillary Refill: Capillary refill takes less than 2  seconds.  Neurological:     General: No focal deficit present.     Mental Status: He is alert.  Psychiatric:        Mood and Affect: Mood normal.        Behavior: Behavior normal.     ED Results / Procedures / Treatments   Labs (all labs ordered are listed, but only abnormal results are displayed) Labs Reviewed  URINALYSIS, ROUTINE W REFLEX MICROSCOPIC - Abnormal; Notable for the following components:      Result Value   Specific Gravity, Urine 1.032 (*)    Leukocytes,Ua LARGE (*)    All other components within normal limits  CBC WITH DIFFERENTIAL/PLATELET - Abnormal; Notable for the following components:   Hemoglobin 11.9 (*)    HCT 38.1 (*)    RDW 15.9 (*)    All other components within normal limits  COMPREHENSIVE METABOLIC PANEL - Abnormal; Notable for the following components:   CO2 21 (*)    Glucose, Bld 178 (*)    Calcium 8.7 (*)    Total Protein 6.4 (*)    Albumin 3.3 (*)    All other components within normal limits    EKG EKG Interpretation Date/Time:  Friday January 17 2023 19:38:06 EDT Ventricular Rate:  117 PR Interval:  192 QRS Duration:  92 QT Interval:  340 QTC Calculation: 474 R Axis:   -5  Text Interpretation: Atrial fibrillation When compared with ECG of 21-Dec-2022 01:41, PREVIOUS ECG IS PRESENT Confirmed by Estanislado Pandy [phone]) on 01/17/2023 9:08:57 PM  Radiology CT Angio Chest PE W and/or Wo Contrast  Result Date: 01/17/2023 CLINICAL DATA:  Pulmonary embolism (PE) suspected, high prob Weakness.  History of colon cancer. EXAM: CT ANGIOGRAPHY CHEST WITH CONTRAST TECHNIQUE: Multidetector CT imaging of the chest was performed using the standard protocol during bolus administration of intravenous contrast. Multiplanar CT image reconstructions and MIPs were obtained to evaluate the vascular anatomy. RADIATION DOSE REDUCTION: This exam was performed according to the departmental dose-optimization program which includes automated exposure control,  adjustment of the mA and/or kV according to patient size and/or use of iterative reconstruction technique. CONTRAST:  OMNIPAQUE IOHEXOL 350 MG/ML SOLN COMPARISON:  Chest CTA 12/21/2022.  Additional priors reviewed. FINDINGS: Cardiovascular: There are no filling defects within the pulmonary arteries to suggest pulmonary embolus. Atherosclerosis of the thoracic aorta without aneurysm or acute aortic finding. Mild chronic cardiomegaly. Coronary artery calcifications. No pericardial effusion.  Mediastinum/Nodes: Decreased size of many of the previously enlarged mediastinal lymph nodes. For example 11 mm right subcarinal/low hilar node measures 11 mm, series 4, image 60 previously 14 mm. AP window node measures 15 mm series 4, image 42, previously 17 mm. Multiple additional prominent and mildly enlarged mediastinal and hilar nodes are stable. No progressive adenopathy. No esophageal wall thickening. Lungs/Pleura: Stable appearance of consolidative left apical opacity from prior exam. Adjacent nodular densities measuring 9 mm series 6, image 42, grossly unchanged. There is smooth bilateral apical septal thickening, mildly improved. No acute airspace disease. Chronic bronchial thickening. No endobronchial lesion or debris. No pleural fluid or pneumothorax. Upper Abdomen: Unchanged 15 mm left adrenal nodule. Cholecystectomy. No acute upper abdominal findings. Musculoskeletal: Stable presumed Schmorl's node superior endplate of T11. Additional degenerative change and Schmorl's nodes in the thoracic spine. Scoliosis. Review of the MIP images confirms the above findings. IMPRESSION: 1. No pulmonary embolus. 2. Stable appearance of consolidative left apical opacity from prior exam. Adjacent nodular densities measuring 9 mm, grossly unchanged. 3. Decreased size of many of the previously enlarged mediastinal lymph nodes. Multiple additional prominent and mildly enlarged mediastinal and hilar nodes are stable. 4. Unchanged  left adrenal nodule, likely adenoma. Aortic Atherosclerosis (ICD10-I70.0). Electronically Signed   By: Narda Rutherford M.D.   On: 01/17/2023 22:27    Procedures Procedures    Medications Ordered in ED Medications  metoprolol tartrate (LOPRESSOR) injection 5 mg (has no administration in time range)  iohexol (OMNIPAQUE) 350 MG/ML injection 100 mL (100 mLs Intravenous Contrast Given 01/17/23 2149)  metoprolol tartrate (LOPRESSOR) tablet 25 mg (25 mg Oral Given 01/17/23 2340)    ED Course/ Medical Decision Making/ A&P Clinical Course as of 01/17/23 2344  Fri Jan 17, 2023  2340 Echo 12/21/22:1. Left ventricular ejection fraction, by estimation, is 60 to 65%. The left ventricle has normal function. The left ventricle has no regional wall motion abnormalities. There is moderate left ventricular hypertrophy.  Left ventricular diastolic parameters are indeterminate. Elevated left ventricular end-diastolic pressure.   [TY]    Clinical Course User Index [TY] Coral Spikes, DO                                 Medical Decision Making 77 year old male presents emergency department for elevated heart rate.  Appears to be in A-fib with a heart rate in the 1 teens.  Hemodynamically stable.  Given his recent diagnosis of cancer, and elevated heart rate with some reported shortness of breath.  Evaluated for PE with CTA.  Negative for acute pulmonary embolism has some stable findings.  Comprehensive panel without significant metabolic derangements.  No kidney injury.  No transaminitis to suggest hepatobiliary disease.  He has no fever or leukocytosis to suggest systemic infection no overt pneumonia on CT scan.  Given metoprolol for heart rate.  Will reevaluate.  Amount and/or Complexity of Data Reviewed Labs: ordered. Radiology: ordered. ECG/medicine tests: ordered.  Risk Prescription drug management.          Final Clinical Impression(s) / ED Diagnoses Final diagnoses:  None    Rx / DC  Orders ED Discharge Orders     None         Coral Spikes, DO 01/17/23 2344

## 2023-01-18 MED ORDER — METOPROLOL TARTRATE 50 MG PO TABS: 50.0000 mg | ORAL_TABLET | Freq: Two times a day (BID) | ORAL | 0 refills | Status: DC

## 2023-01-18 MED ORDER — DILTIAZEM HCL ER COATED BEADS 120 MG PO CP24: 120.0000 mg | ORAL_CAPSULE | Freq: Every day | ORAL | 0 refills | Status: DC

## 2023-01-18 MED ORDER — DILTIAZEM HCL 30 MG PO TABS
30.0000 mg | ORAL_TABLET | Freq: Once | ORAL | Status: AC
  Administered 2023-01-18: 30 mg via ORAL
  Filled 2023-01-18: qty 1

## 2023-01-18 MED ORDER — DILTIAZEM HCL 25 MG/5ML IV SOLN
10.0000 mg | Freq: Once | INTRAVENOUS | Status: AC
  Administered 2023-01-18: 10 mg via INTRAVENOUS
  Filled 2023-01-18: qty 5

## 2023-01-18 NOTE — ED Provider Notes (Signed)
Care assumed form Dr. Maple Hudson, patient with atrial fibrillation, plan is for discharge once adequate rate control is achieved.  In spite of extra metoprolol, heart rate continued to be greater than 110.  I have ordered a dose of intravenous diltiazem and a dose of oral diltiazem.  Following diltiazem, heart rate is adequately controlled.  I feel patient is safe for discharge.  I am discharging him with instructions to increase his dose of metoprolol to 50 mg twice a day, and also giving him a prescription controlled release diltiazem.  He has an appointment scheduled with cardiology in 1 week and he is to keep that appointment.  CRITICAL CARE Performed by: Dione Booze Total critical care time: 35 minutes Critical care time was exclusive of separately billable procedures and treating other patients. Critical care was necessary to treat or prevent imminent or life-threatening deterioration. Critical care was time spent personally by me on the following activities: development of treatment plan with patient and/or surrogate as well as nursing, discussions with consultants, evaluation of patient's response to treatment, examination of patient, obtaining history from patient or surrogate, ordering and performing treatments and interventions, ordering and review of laboratory studies, ordering and review of radiographic studies, pulse oximetry and re-evaluation of patient's condition.   Dione Booze, MD 01/18/23 [phone]

## 2023-01-18 NOTE — Discharge Instructions (Addendum)
I have increased your dose of metoprolol to 50 mg twice a day.  I am also giving you a prescription for diltiazem which you take once a day.  These are to keep your heart rate under control.  Please see the cardiologist next week as scheduled.  Return to the emergency department if you have any new or concerning symptoms.

## 2023-01-18 NOTE — ED Notes (Signed)
Pt's ride will be here in the morning to pick up pt

## 2023-01-20 ENCOUNTER — Observation Stay (HOSPITAL_COMMUNITY)
Admission: EM | Admit: 2023-01-20 | Discharge: 2023-01-21 | Disposition: A | Discharge status: Home / Self Care | Payer: No Typology Code available for payment source | Attending: Family Medicine | Admitting: Family Medicine

## 2023-01-20 ENCOUNTER — Other Ambulatory Visit: Payer: Self-pay

## 2023-01-20 ENCOUNTER — Encounter (HOSPITAL_COMMUNITY): Payer: Self-pay | Admitting: *Deleted

## 2023-01-20 ENCOUNTER — Emergency Department (HOSPITAL_COMMUNITY): Payer: No Typology Code available for payment source

## 2023-01-20 DIAGNOSIS — I5030 Unspecified diastolic (congestive) heart failure: Principal | ICD-10-CM | POA: Insufficient documentation

## 2023-01-20 DIAGNOSIS — Z96653 Presence of artificial knee joint, bilateral: Secondary | ICD-10-CM | POA: Diagnosis not present

## 2023-01-20 DIAGNOSIS — Z7901 Long term (current) use of anticoagulants: Secondary | ICD-10-CM

## 2023-01-20 DIAGNOSIS — I480 Paroxysmal atrial fibrillation: Secondary | ICD-10-CM | POA: Diagnosis present

## 2023-01-20 DIAGNOSIS — E782 Mixed hyperlipidemia: Secondary | ICD-10-CM | POA: Diagnosis present

## 2023-01-20 DIAGNOSIS — G4730 Sleep apnea, unspecified: Secondary | ICD-10-CM | POA: Diagnosis present

## 2023-01-20 DIAGNOSIS — I110 Hypertensive heart disease with heart failure: Secondary | ICD-10-CM | POA: Insufficient documentation

## 2023-01-20 DIAGNOSIS — J810 Acute pulmonary edema: Secondary | ICD-10-CM | POA: Diagnosis present

## 2023-01-20 DIAGNOSIS — Z87891 Personal history of nicotine dependence: Secondary | ICD-10-CM | POA: Diagnosis not present

## 2023-01-20 DIAGNOSIS — J45909 Unspecified asthma, uncomplicated: Secondary | ICD-10-CM | POA: Insufficient documentation

## 2023-01-20 DIAGNOSIS — G4733 Obstructive sleep apnea (adult) (pediatric): Secondary | ICD-10-CM | POA: Diagnosis present

## 2023-01-20 DIAGNOSIS — Z79899 Other long term (current) drug therapy: Secondary | ICD-10-CM | POA: Insufficient documentation

## 2023-01-20 DIAGNOSIS — I509 Heart failure, unspecified: Secondary | ICD-10-CM

## 2023-01-20 DIAGNOSIS — I10 Essential (primary) hypertension: Secondary | ICD-10-CM | POA: Diagnosis present

## 2023-01-20 DIAGNOSIS — I5031 Acute diastolic (congestive) heart failure: Secondary | ICD-10-CM | POA: Diagnosis not present

## 2023-01-20 DIAGNOSIS — Z1152 Encounter for screening for COVID-19: Secondary | ICD-10-CM | POA: Diagnosis not present

## 2023-01-20 DIAGNOSIS — R600 Localized edema: Secondary | ICD-10-CM | POA: Diagnosis present

## 2023-01-20 DIAGNOSIS — J9601 Acute respiratory failure with hypoxia: Principal | ICD-10-CM | POA: Diagnosis present

## 2023-01-20 DIAGNOSIS — J449 Chronic obstructive pulmonary disease, unspecified: Secondary | ICD-10-CM | POA: Diagnosis present

## 2023-01-20 DIAGNOSIS — K219 Gastro-esophageal reflux disease without esophagitis: Secondary | ICD-10-CM | POA: Diagnosis present

## 2023-01-20 DIAGNOSIS — R0602 Shortness of breath: Secondary | ICD-10-CM | POA: Diagnosis present

## 2023-01-20 DIAGNOSIS — E785 Hyperlipidemia, unspecified: Secondary | ICD-10-CM | POA: Diagnosis present

## 2023-01-20 LAB — CBC WITH DIFFERENTIAL/PLATELET
Abs Immature Granulocytes: 0.05 10*3/uL (ref 0.00–0.07)
Basophils Absolute: 0.1 10*3/uL (ref 0.0–0.1)
Basophils Relative: 0 %
Eosinophils Absolute: 0 10*3/uL (ref 0.0–0.5)
Eosinophils Relative: 0 %
HCT: 38.1 % — ABNORMAL LOW (ref 39.0–52.0)
Hemoglobin: 11.9 g/dL — ABNORMAL LOW (ref 13.0–17.0)
Immature Granulocytes: 0 %
Lymphocytes Relative: 12 %
Lymphs Abs: 1.5 10*3/uL (ref 0.7–4.0)
MCH: 27.6 pg (ref 26.0–34.0)
MCHC: 31.2 g/dL (ref 30.0–36.0)
MCV: 88.4 fL (ref 80.0–100.0)
Monocytes Absolute: 0.9 10*3/uL (ref 0.1–1.0)
Monocytes Relative: 7 %
Neutro Abs: 10 10*3/uL — ABNORMAL HIGH (ref 1.7–7.7)
Neutrophils Relative %: 81 %
Platelets: 225 10*3/uL (ref 150–400)
RBC: 4.31 MIL/uL (ref 4.22–5.81)
RDW: 16 % — ABNORMAL HIGH (ref 11.5–15.5)
WBC: 12.5 10*3/uL — ABNORMAL HIGH (ref 4.0–10.5)
nRBC: 0 % (ref 0.0–0.2)

## 2023-01-20 LAB — BLOOD GAS, VENOUS
Acid-base deficit: 0.6 mmol/L (ref 0.0–2.0)
Bicarbonate: 25.4 mmol/L (ref 20.0–28.0)
Drawn by: 21160
O2 Saturation: 72.5 %
Patient temperature: 36.4
pCO2, Ven: 45 mmHg (ref 44–60)
pH, Ven: 7.36 (ref 7.25–7.43)
pO2, Ven: 41 mmHg (ref 32–45)

## 2023-01-20 LAB — BRAIN NATRIURETIC PEPTIDE: B Natriuretic Peptide: 417 pg/mL — ABNORMAL HIGH (ref 0.0–100.0)

## 2023-01-20 LAB — SARS CORONAVIRUS 2 BY RT PCR: SARS Coronavirus 2 by RT PCR: NEGATIVE

## 2023-01-20 LAB — HEPATIC FUNCTION PANEL
ALT: 31 U/L (ref 0–44)
AST: 30 U/L (ref 15–41)
Albumin: 3.2 g/dL — ABNORMAL LOW (ref 3.5–5.0)
Alkaline Phosphatase: 55 U/L (ref 38–126)
Bilirubin, Direct: 0.2 mg/dL (ref 0.0–0.2)
Indirect Bilirubin: 0.7 mg/dL (ref 0.3–0.9)
Total Bilirubin: 0.9 mg/dL (ref 0.3–1.2)
Total Protein: 6.7 g/dL (ref 6.5–8.1)

## 2023-01-20 LAB — BASIC METABOLIC PANEL
Anion gap: 6 (ref 5–15)
BUN: 23 mg/dL (ref 8–23)
CO2: 21 mmol/L — ABNORMAL LOW (ref 22–32)
Calcium: 8.8 mg/dL — ABNORMAL LOW (ref 8.9–10.3)
Chloride: 109 mmol/L (ref 98–111)
Creatinine, Ser: 0.87 mg/dL (ref 0.61–1.24)
GFR, Estimated: >60 mL/min (ref >60)
Glucose, Bld: 144 mg/dL — ABNORMAL HIGH (ref 70–99)
Potassium: 4.1 mmol/L (ref 3.5–5.1)
Sodium: 136 mmol/L (ref 135–145)

## 2023-01-20 LAB — POTASSIUM: Potassium: 3.8 mmol/L (ref 3.5–5.1)

## 2023-01-20 LAB — TROPONIN I (HIGH SENSITIVITY)
Troponin I (High Sensitivity): 87 ng/L — ABNORMAL HIGH (ref <18)
Troponin I (High Sensitivity): 92 ng/L — ABNORMAL HIGH (ref <18)

## 2023-01-20 LAB — MRSA NEXT GEN BY PCR, NASAL: MRSA by PCR Next Gen: NOT DETECTED

## 2023-01-20 LAB — PROCALCITONIN: Procalcitonin: <0.1 ng/mL

## 2023-01-20 MED ORDER — ORAL CARE MOUTH RINSE: 15.0000 mL | OROMUCOSAL | Status: DC | PRN

## 2023-01-20 MED ORDER — TRAZODONE HCL 50 MG PO TABS
25.0000 mg | ORAL_TABLET | Freq: Every evening | ORAL | Status: DC | PRN
  Administered 2023-01-20: 25 mg via ORAL
  Filled 2023-01-20: qty 1

## 2023-01-20 MED ORDER — LOSARTAN POTASSIUM 25 MG PO TABS
25.0000 mg | ORAL_TABLET | Freq: Every day | ORAL | Status: DC
  Administered 2023-01-21: 25 mg via ORAL
  Filled 2023-01-20: qty 1

## 2023-01-20 MED ORDER — POTASSIUM CHLORIDE CRYS ER 10 MEQ PO TBCR
20.0000 meq | EXTENDED_RELEASE_TABLET | Freq: Every day | ORAL | Status: DC
  Administered 2023-01-21: 20 meq via ORAL
  Filled 2023-01-20: qty 2

## 2023-01-20 MED ORDER — ONDANSETRON HCL 4 MG/2ML IJ SOLN: 4.0000 mg | Freq: Four times a day (QID) | INTRAMUSCULAR | Status: DC | PRN

## 2023-01-20 MED ORDER — FUROSEMIDE 10 MG/ML IJ SOLN
80.0000 mg | Freq: Once | INTRAMUSCULAR | Status: AC
  Administered 2023-01-20: 80 mg via INTRAVENOUS
  Filled 2023-01-20: qty 8

## 2023-01-20 MED ORDER — ACETAMINOPHEN 325 MG PO TABS: 650.0000 mg | ORAL_TABLET | Freq: Four times a day (QID) | ORAL | Status: DC | PRN

## 2023-01-20 MED ORDER — METOPROLOL TARTRATE 50 MG PO TABS
50.0000 mg | ORAL_TABLET | Freq: Two times a day (BID) | ORAL | Status: DC
  Administered 2023-01-20 – 2023-01-21 (×3): 50 mg via ORAL
  Filled 2023-01-20 (×3): qty 1

## 2023-01-20 MED ORDER — ONDANSETRON HCL 4 MG PO TABS: 4.0000 mg | ORAL_TABLET | Freq: Four times a day (QID) | ORAL | Status: DC | PRN

## 2023-01-20 MED ORDER — MELATONIN 3 MG PO TABS
9.0000 mg | ORAL_TABLET | Freq: Every day | ORAL | Status: DC
  Administered 2023-01-20: 9 mg via ORAL
  Filled 2023-01-20: qty 3

## 2023-01-20 MED ORDER — BISACODYL 5 MG PO TBEC: 5.0000 mg | DELAYED_RELEASE_TABLET | Freq: Every day | ORAL | Status: DC | PRN

## 2023-01-20 MED ORDER — PANTOPRAZOLE SODIUM 40 MG PO TBEC
40.0000 mg | DELAYED_RELEASE_TABLET | Freq: Every day | ORAL | Status: DC
  Administered 2023-01-20 – 2023-01-21 (×2): 40 mg via ORAL
  Filled 2023-01-20 (×2): qty 1

## 2023-01-20 MED ORDER — ACETAMINOPHEN 650 MG RE SUPP: 650.0000 mg | Freq: Four times a day (QID) | RECTAL | Status: DC | PRN

## 2023-01-20 MED ORDER — APIXABAN 5 MG PO TABS
5.0000 mg | ORAL_TABLET | Freq: Two times a day (BID) | ORAL | Status: DC
  Administered 2023-01-20 – 2023-01-21 (×3): 5 mg via ORAL
  Filled 2023-01-20 (×3): qty 1

## 2023-01-20 MED ORDER — CHLORHEXIDINE GLUCONATE CLOTH 2 % EX PADS
6.0000 | MEDICATED_PAD | Freq: Every day | CUTANEOUS | Status: DC
  Administered 2023-01-20 – 2023-01-21 (×2): 6 via TOPICAL

## 2023-01-20 MED ORDER — VITAMIN C 500 MG PO TABS
500.0000 mg | ORAL_TABLET | Freq: Every day | ORAL | Status: DC
  Administered 2023-01-20 – 2023-01-21 (×2): 500 mg via ORAL
  Filled 2023-01-20 (×2): qty 1

## 2023-01-20 MED ORDER — FENTANYL CITRATE PF 50 MCG/ML IJ SOSY: 12.5000 ug | PREFILLED_SYRINGE | INTRAMUSCULAR | Status: DC | PRN

## 2023-01-20 MED ORDER — LORATADINE 10 MG PO TABS: 10.0000 mg | ORAL_TABLET | Freq: Every day | ORAL | Status: DC | PRN

## 2023-01-20 MED ORDER — HYDROCODONE-ACETAMINOPHEN 5-325 MG PO TABS: 1.0000 | ORAL_TABLET | ORAL | Status: DC | PRN

## 2023-01-20 MED ORDER — ROSUVASTATIN CALCIUM 20 MG PO TABS
20.0000 mg | ORAL_TABLET | Freq: Every day | ORAL | Status: DC
  Administered 2023-01-20 – 2023-01-21 (×2): 20 mg via ORAL
  Filled 2023-01-20 (×2): qty 1

## 2023-01-20 MED ORDER — DILTIAZEM HCL ER COATED BEADS 120 MG PO CP24
120.0000 mg | ORAL_CAPSULE | Freq: Every day | ORAL | Status: DC
  Administered 2023-01-21: 120 mg via ORAL
  Filled 2023-01-20: qty 1

## 2023-01-20 MED ORDER — FUROSEMIDE 10 MG/ML IJ SOLN
40.0000 mg | Freq: Every day | INTRAMUSCULAR | Status: DC
  Administered 2023-01-21: 40 mg via INTRAVENOUS
  Filled 2023-01-20: qty 4

## 2023-01-20 MED ORDER — OMEGA-3-ACID ETHYL ESTERS 1 G PO CAPS
1.0000 g | ORAL_CAPSULE | Freq: Every day | ORAL | Status: DC
  Administered 2023-01-21: 1 g via ORAL
  Filled 2023-01-20: qty 1

## 2023-01-20 MED ORDER — OXYBUTYNIN CHLORIDE 5 MG PO TABS
5.0000 mg | ORAL_TABLET | Freq: Every evening | ORAL | Status: DC
  Administered 2023-01-20: 5 mg via ORAL
  Filled 2023-01-20: qty 1

## 2023-01-20 NOTE — H&P (Signed)
History and Physical  [facility]  Gary Carroll JYN:[id number] DOB: [birth year] DOA: 01/20/2023  PCP: Benita Stabile, MD  Patient coming from: Home by [facility]  Level of care: Telemetry  I have personally briefly reviewed patient's old medical records in [facility] Link  Chief Complaint: SOB   HPI: Gary Carroll is a 77 year old gentleman with paroxysmal atrial fibrillation, diastolic heart failure, heart murmur, hyperlipidemia, hypertension, OSA on CPAP, chronic urinary retention requiring self-catheterization, recently diagnosed partially obstructing rectal cancer, GERD and other PMH detailed below was seen in the emergency department yesterday for dyspnea and uncontrolled A-fib.  They were able to get his heart rate controlled in the ED.  His metoprolol was increased to 50 mg twice daily and he was started on diltiazem CD 120 mg.  He discharged home.  Overnight he developed acute severe SOB and presented back to ED by [facility] on bipap.  He was noted to be volume overloaded and given 80 mg IV furosemide and he diuresed over 2 liters.  He was weaned off bipap back down to room air.  He is being admitted for acute diastolic heart failure.     Past Medical History:  Diagnosis Date   A-fib [facility])    Allergic rhinitis    Arthritis    Asthma    seasonal per pt   Bilateral knee pain    OA   Diverticulosis 02/2012   seen on colonoscopy   Erectile dysfunction    GERD (gastroesophageal reflux disease)    Heart murmur    Hiatal hernia    HLD (hyperlipidemia)    HTN (hypertension)    Hydronephrosis determined by ultrasound 08/22/2015   Overactive bladder    Prediabetes    Pulmonary lesion    Ringing of ears    Sleep apnea    Urinary frequency    Urinary urgency    Wears glasses     Past Surgical History:  Procedure Laterality Date   CATARACT EXTRACTION W/PHACO Left 12/07/2021   Procedure: CATARACT EXTRACTION PHACO AND INTRAOCULAR LENS PLACEMENT (IOC);  Surgeon: Fabio Pierce, MD;  Location: [facility] ORS;  Service: Ophthalmology;  Laterality: Left;  CDE 7.75   CATARACT EXTRACTION W/PHACO Right 12/21/2021   Procedure: CATARACT EXTRACTION PHACO AND INTRAOCULAR LENS PLACEMENT (IOC);  Surgeon: Fabio Pierce, MD;  Location: [facility] ORS;  Service: Ophthalmology;  Laterality: Right;  CDE: 8.00   CHOLECYSTECTOMY     COLONOSCOPY  02/2012   Dr. Arlyce Dice: sigmoid, transverse, and ascending colon diverticulosis   COLONOSCOPY WITH PROPOFOL N/A 01/13/2023   Procedure: COLONOSCOPY WITH PROPOFOL;  Surgeon: Lanelle Bal, DO;  Location: [facility] ENDO SUITE;  Service: Endoscopy;  Laterality: N/A;  1:00 pm, asa 3   FINGER SURGERY Right    prostate procedure  2017   per patient, was having trouble urinating    TONSILLECTOMY     TOTAL KNEE ARTHROPLASTY  2012   left (Dr. 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/19/2015   Procedure: TOTAL KNEE ARTHROPLASTY;  Surgeon: Dannielle Huh, MD;  Location: [facility] OR;  Service: Orthopedics;  Laterality: Right;     reports that he quit smoking about 13 years ago. His smoking use included cigarettes. He has never used smokeless tobacco. He reports that he does not currently use alcohol after a past usage of about 1.0 - 2.0 standard drink of alcohol per week. He reports that he does not use drugs.  Allergies  Allergen Reactions   Diovan [Valsartan] Cough  Tagamet [Cimetidine] Other (See Comments)    irrittability    Family History  Problem Relation Age of Onset   Uterine cancer Mother    Cancer Mother        uterine   Coronary artery disease Father    Coronary artery disease Paternal Grandfather    Heart disease Paternal Grandfather    Colon cancer Neg Hx    Esophageal cancer Neg Hx    Stomach cancer Neg Hx    Rectal cancer Neg Hx    Liver disease Neg Hx     Prior to Admission medications   Medication Sig Start Date End Date Taking? Authorizing Provider  apixaban (ELIQUIS) 5 MG TABS tablet Take 1 tablet (5 mg total) by mouth 2 (two) times daily.  08/24/15  Yes Philip Aspen, Limmie Patricia, MD  diltiazem (CARDIZEM CD) 120 MG 24 hr capsule Take 1 capsule (120 mg total) by mouth daily. 01/18/23  Yes Dione Booze, MD  furosemide (LASIX) 40 MG tablet Take 1.5 tablets (60 mg total) by mouth daily. 12/23/22  Yes Vassie Loll, MD  loratadine (CLARITIN) 10 MG tablet Take 10 mg by mouth daily as needed for allergies.   Yes [provider]  losartan (COZAAR) 50 MG tablet Take 0.5 tablets (25 mg total) by mouth daily. Patient taking differently: Take 50 mg by mouth daily. 12/23/22  Yes Vassie Loll, MD  magnesium chloride (SLOW-MAG) 64 MG TBEC SR tablet Take 1 tablet by mouth daily.   Yes [provider]  Melatonin 5 MG TABS Take 10 mg by mouth at bedtime.    Yes [provider]  metoprolol tartrate (LOPRESSOR) 50 MG tablet Take 1 tablet (50 mg total) by mouth 2 (two) times daily. 01/18/23  Yes Dione Booze, MD  Multiple Vitamins-Minerals (MULTIVITAL) tablet Take 1 tablet by mouth daily.   Yes [provider]  Omega-3 Fatty Acids (FISH OIL) 1000 MG CAPS Take 4 capsules by mouth daily.    Yes [provider]  oxybutynin (DITROPAN) 5 MG tablet Take 5 mg by mouth every evening. 06/01/19  Yes [provider]  pantoprazole (PROTONIX) 40 MG tablet Take 40 mg by mouth daily.   Yes [provider]  potassium chloride (K-DUR) 10 MEQ tablet Take 1 tablet (10 mEq total) by mouth daily. 10/09/17  Yes Erick Blinks, MD  rosuvastatin (CRESTOR) 20 MG tablet Take 20 mg by mouth daily.   Yes [provider]  vitamin C (ASCORBIC ACID) 500 MG tablet Take 500 mg by mouth daily.   Yes [provider]    Physical Exam: Vitals:   01/20/23 1100 01/20/23 1115 01/20/23 1121 01/20/23 1130  BP: 137/73 (!) 143/69  (!) 134/44  Pulse: 71 72  70  Resp: (!) 25 (!) 26  (!) 29  Temp:   (!) 97.4 F (36.3 C)   TempSrc:   Oral   SpO2: 98% 98%  98%  Weight:    104.5 kg  Height:    5\' 11"  (1.803 m)     Constitutional: moderately distressed lying supine, cooperative, appears chronically ill Eyes: PERRL, lids and conjunctivae normal ENMT: Mucous membranes are moist. Posterior pharynx clear of any exudate or lesions. Neck: normal, supple, no masses, no thyromegaly Respiratory: diffuse crackles, no rales, moderate increased work of breathing.  Cardiovascular: normal s1, s2 sounds, 2/6 holosystolic murmur No rubs / gallops. 3+ pitting lower extremity edema. 2+ pedal pulses. No carotid bruits.  Abdomen: obese, mildly distended, no tenderness, no masses palpated. No  hepatosplenomegaly. Bowel sounds positive.  Musculoskeletal: 3+ pitting edema BLEs,  no clubbing / cyanosis. No joint deformity upper and lower extremities. Good ROM, no contractures. Normal muscle tone.  Skin: chronic venous stasis edema BLEs, no ulcers. mild induration in lower extremities. Neurologic: CN 2-12 grossly intact. Sensation intact, DTR normal. Strength 5/5 in all 4.  Psychiatric: Normal judgment and insight. Alert and oriented x 3. Normal mood.   Labs on Admission: I have personally reviewed following labs and imaging studies  CBC: Recent Labs  Lab 01/17/23 1946 01/20/23 0655  WBC 6.9 12.5*  NEUTROABS 4.2 10.0*  HGB 11.9* 11.9*  HCT 38.1* 38.1*  MCV 88.6 88.4  PLT 197 225   Basic Metabolic Panel: Recent Labs  Lab 01/17/23 1946 01/20/23 0655  NA 140 136  K 3.9 4.1  CL 109 109  CO2 21* 21*  GLUCOSE 178* 144*  BUN 18 23  CREATININE 0.89 0.87  CALCIUM 8.7* 8.8*   GFR: Estimated Creatinine Clearance: 87.5 mL/min (by C-G formula based on SCr of 0.87 mg/dL). Liver Function Tests: Recent Labs  Lab 01/17/23 1946 01/20/23 0749  AST 32 30  ALT 33 31  ALKPHOS 62 55  BILITOT 0.6 0.9  PROT 6.4* 6.7  ALBUMIN 3.3* 3.2*   No results for input(s): "LIPASE", "AMYLASE" in the last 168 hours. No results for input(s): "AMMONIA" in the last 168 hours. Coagulation Profile: No results for input(s): "INR",  "PROTIME" in the last 168 hours. Cardiac Enzymes: No results for input(s): "CKTOTAL", "CKMB", "CKMBINDEX", "TROPONINI" in the last 168 hours. BNP (last 3 results) No results for input(s): "PROBNP" in the last 8760 hours. HbA1C: No results for input(s): "HGBA1C" in the last 72 hours. CBG: No results for input(s): "GLUCAP" in the last 168 hours. Lipid Profile: No results for input(s): "CHOL", "HDL", "LDLCALC", "TRIG", "CHOLHDL", "LDLDIRECT" in the last 72 hours. Thyroid Function Tests: No results for input(s): "TSH", "T4TOTAL", "FREET4", "T3FREE", "THYROIDAB" in the last 72 hours. Anemia Panel: No results for input(s): "VITAMINB12", "FOLATE", "FERRITIN", "TIBC", "IRON", "RETICCTPCT" in the last 72 hours. Urine analysis:    Component Value Date/Time   COLORURINE YELLOW 01/17/2023 2251   APPEARANCEUR CLEAR 01/17/2023 2251   LABSPEC 1.032 (H) 01/17/2023 2251   PHURINE 5.0 01/17/2023 2251   GLUCOSEU NEGATIVE 01/17/2023 2251   HGBUR NEGATIVE 01/17/2023 2251   BILIRUBINUR NEGATIVE 01/17/2023 2251   BILIRUBINUR neg 08/31/2012 1203   KETONESUR NEGATIVE 01/17/2023 2251   PROTEINUR NEGATIVE 01/17/2023 2251   UROBILINOGEN negative 08/31/2012 1203   UROBILINOGEN 0.2 05/31/2010 1125   NITRITE NEGATIVE 01/17/2023 2251   LEUKOCYTESUR LARGE (A) 01/17/2023 2251    Radiological Exams on Admission: DG Chest Port 1 View  Result Date: 01/20/2023 CLINICAL DATA:  77 year old male with history of shortness of breath. EXAM: PORTABLE CHEST 1 VIEW COMPARISON:  Chest x-ray 12/20/2022. FINDINGS: Lung volumes are normal. Diffuse interstitial prominence and widespread peribronchial cuffing, concerning for potential acute bronchitis. Patchy ill-defined opacities are noted scattered randomly throughout the lungs bilaterally. No pleural effusions. No pneumothorax. No pulmonary nodule or mass noted. Pulmonary vasculature and the cardiomediastinal silhouette are within normal limits. Atherosclerosis in the thoracic  aorta. IMPRESSION: 1. The appearance of the chest suggests possible bronchitis, and developing multilobar bronchopneumonia, as above. 2. Aortic atherosclerosis. Electronically Signed   By: Trudie Reed M.D.   On: 01/20/2023 07:33    EKG: Independently reviewed. NSR  Assessment/Plan Principal Problem:   Acute heart failure with preserved ejection fraction (HFpEF) (HCC) Active Problems:   Essential  hypertension, benign   Mixed hyperlipidemia   Peripheral edema   GERD (gastroesophageal reflux disease)   AF (paroxysmal atrial fibrillation) (HCC)   Sleep apnea   History of tobacco use   Acute pulmonary edema (HCC)   Chronic anticoagulation   Obstructive lung disease (generalized) (HCC)   Acute respiratory failure with hypoxia (HCC)   Acute HFpEF - pt appears very volume overloaded with massive bilateral leg edema - he has diuresed 2.5L already since receiving IV furosemide in ED - he says he takes his oral home furosemide daily but sometimes has no effect from it - for now, continue IV furosemide 40 mg daily with potassium supplement  - he had a recent 2 D echocardiogram  - monitor I/Os, weights, ReDs vest reading  Acute respiratory failure with hypoxia  - secondary to Acute HFpEF - with negative procalcitonin, low likelihood of pneumonia - recheck portable CXR in am after diuresis  - weaned to nasal cannula oxygen with diuresis - continue nightly CPaP  Paroxysmal Atrial Fibrillation  - had recently been uncontrolled but with new med adjustments made during recent ED visit he is having better HR control - continue metoprolol 50 mg BID with holding parameters - continue diltiazem CD 120 mg daily  - continue apixaban 5 mg BID for full anticoagulation   OSA - reports compliance on home CPAP - we have ordered CPAP use in hospital   Essential Hypertension - Blood pressure controlled on current regimen - monitoring  Hyperlipidemia - resume home rosuvastatin  therapy  Rectal carcinoma - recently diagnosis - [facility]  - they are arranging for him to have surgery in [place] in next weeks - pt has close outpatient follow up with oncology, surgery and cardiology to coordinate care   DVT prophylaxis: apixaban   Code Status: Full   Family Communication:   Disposition Plan: anticipate home   Consults called:   Admission status: OBV  Level of care: Telemetry Standley Dakins MD [facility] How to contact the [facility] Attending or Consulting provider 7A - 7P or covering provider during after hours 7P -7A, for this patient?  Check the care team in [facility] and look for a) attending/consulting [facility] provider listed and b) the [facility] team listed Log into [email or web address] and use [place]'s universal password to access. If you do not have the password, please contact the hospital operator. Locate the [facility] provider you are looking for under [facility] and page to a number that you can be directly reached. If you still have difficulty reaching the provider, please page the [facility] (Director on Call) for the Hospitalists listed on amion for assistance.   If 7PM-7AM, please contact night-coverage [email or web address] Password [email or web address]  01/20/2023, 1:12 PM

## 2023-01-20 NOTE — ED Provider Notes (Signed)
[place] EMERGENCY DEPARTMENT AT [facility] Provider Note   CSN: [id number] Arrival date & time: 01/20/23  4098     History  Chief Complaint  Patient presents with   Respiratory Distress    Gary Carroll is a 77 y.o. male.  The history is provided by the patient and the EMS personnel.  He has history of hypertension, hyperlipidemia, asthma, atrial fibrillation anticoagulated on apixaban and was brought in by ambulance because of shortness of breath.  He started getting short of breath tonight.  He denies chest pain, heaviness, tightness, pressure.  EMS noted initial oxygen saturation of 82% and they put him on CPAP.  They were planning to give nebulizer treatment, but did not have the correct attachments for his CPAP mask.  Patient states that he has been compliant with his medications.   Home Medications Prior to Admission medications   Medication Sig Start Date End Date Taking? Authorizing Provider  apixaban (ELIQUIS) 5 MG TABS tablet Take 1 tablet (5 mg total) by mouth 2 (two) times daily. 08/24/15   Philip Aspen, Limmie Patricia, MD  diltiazem (CARDIZEM CD) 120 MG 24 hr capsule Take 1 capsule (120 mg total) by mouth daily. 01/18/23   Dione Booze, MD  furosemide (LASIX) 40 MG tablet Take 1.5 tablets (60 mg total) by mouth daily. 12/23/22   Vassie Loll, MD  loratadine (CLARITIN) 10 MG tablet Take 10 mg by mouth daily as needed for allergies.    [provider]  losartan (COZAAR) 50 MG tablet Take 0.5 tablets (25 mg total) by mouth daily. 12/23/22   Vassie Loll, MD  magnesium chloride (SLOW-MAG) 64 MG TBEC SR tablet Take 1 tablet by mouth daily.    [provider]  Melatonin 5 MG TABS Take 10 mg by mouth at bedtime.     [provider]  metoprolol tartrate (LOPRESSOR) 50 MG tablet Take 1 tablet (50 mg total) by mouth 2 (two) times daily. 01/18/23   Dione Booze, MD  Multiple Vitamins-Minerals (MULTIVITAL) tablet Take 1 tablet by mouth daily.     [provider]  Omega-3 Fatty Acids (FISH OIL) 1000 MG CAPS Take 4 capsules by mouth daily.     [provider]  oxybutynin (DITROPAN) 5 MG tablet Take 5 mg by mouth every evening. 06/01/19   [provider]  pantoprazole (PROTONIX) 40 MG tablet Take 40 mg by mouth daily.    [provider]  potassium chloride (K-DUR) 10 MEQ tablet Take 1 tablet (10 mEq total) by mouth daily. 10/09/17   Erick Blinks, MD  rosuvastatin (CRESTOR) 20 MG tablet Take 20 mg by mouth daily.    [provider]  tamsulosin (FLOMAX) 0.4 MG CAPS capsule Take 0.4 mg by mouth daily.    [provider]  vitamin C (ASCORBIC ACID) 500 MG tablet Take 500 mg by mouth daily.    [provider]      Allergies    Diovan [valsartan] and Tagamet [cimetidine]    Review of Systems   Review of Systems  All other systems reviewed and are negative.   Physical Exam Updated Vital Signs BP (!) 162/72   Pulse 81   Temp (!) 97.5 F (36.4 C) (Axillary)   Resp 17   SpO2 96%  Physical Exam Vitals and nursing note reviewed.   77 year old male, resting comfortably and in no acute distress. Vital signs are significant for elevated blood pressure. Oxygen saturation is 96%, which is normal. Head  is normocephalic and atraumatic. PERRLA, EOMI. Oropharynx is clear. Neck is nontender and supple without adenopathy.  JVD is present. Lungs have bibasilar rales about one third of the way up.  There are no wheezes or rhonchi. Chest is nontender. Heart has regular rate and rhythm without murmur. Abdomen is soft, flat, nontender. Extremities have 3+ edema with moderate venous stasis changes. Skin is warm and dry without rash. Neurologic: Mental status is normal, cranial nerves are intact, moves all extremities equally.  ED Results / Procedures / Treatments   Labs (all labs ordered are listed, but only abnormal results are displayed) Labs Reviewed  SARS CORONAVIRUS 2 BY RT PCR   BASIC METABOLIC PANEL  BRAIN NATRIURETIC PEPTIDE  CBC WITH DIFFERENTIAL/PLATELET  TROPONIN I (HIGH SENSITIVITY)    EKG EKG Interpretation Date/Time:  Monday January 20 2023 07:13:37 EDT Ventricular Rate:  71 PR Interval:  185 QRS Duration:  91 QT Interval:  434 QTC Calculation: 472 R Axis:   3  Text Interpretation: Sinus rhythm Anteroseptal infarct, old Nonspecific repol abnormality, diffuse leads When compared with ECG of 01/17/2023, Sinus rhythm has replaced Atrial fibrillation Confirmed by Dione Booze ([id number]) on 01/20/2023 7:16:29 AM  Radiology No results found.  Procedures Procedures  Cardiac monitor shows normal sinus rhythm, per my interpretation.  Medications Ordered in ED Medications  furosemide (LASIX) injection 80 mg (has no administration in time range)    ED Course/ Medical Decision Making/ A&P                                 Medical Decision Making Amount and/or Complexity of Data Reviewed Labs: ordered. Radiology: ordered.  Risk Prescription drug management.   Shortness of breath which clinically appears to be heart failure exacerbation.  No evidence of bronchospasm on exam, doubt pneumonia.  Consider angina equivalent.  Since he does appear to be fluid overloaded, I have ordered an initial dose of furosemide and I have ordered workup including chest x-ray, ECG, CBC, basic metabolic panel, troponin, BNP.  I have reviewed his past records, and he had been admitted from 12/20/2022 to 12/23/2018 for with acute respiratory failure from CHF exacerbation, also atrial fibrillation.  Echocardiogram on 12/21/2022 showed normal ejection fraction of 60-65% with indeterminate left ventricular diastolic parameters.  He was also seen in the emergency department on 01/17/2023 with atrial fibrillation, not currently in atrial fibrillation.  I reviewed and interpreted his electrocardiogram and my interpretation is sinus rhythm with nonspecific repolarization abnormalities but  improved compared with most recent ECG which also had him in atrial fibrillation.  Case is signed out to Dr. Estell Harpin.  CRITICAL CARE Performed by: Dione Booze Total critical care time: 40 minutes Critical care time was exclusive of separately billable procedures and treating other patients. Critical care was necessary to treat or prevent imminent or life-threatening deterioration. Critical care was time spent personally by me on the following activities: development of treatment plan with patient and/or surrogate as well as nursing, discussions with consultants, evaluation of patient's response to treatment, examination of patient, obtaining history from patient or surrogate, ordering and performing treatments and interventions, ordering and review of laboratory studies, ordering and review of radiographic studies, pulse oximetry and re-evaluation of patient's condition.  Final Clinical Impression(s) / ED Diagnoses Final diagnoses:  Acute respiratory failure with hypoxia (HCC)  Acute on chronic heart failure, unspecified heart failure type (HCC)    Rx / DC Orders ED Discharge Orders  None         Dione Booze, MD 01/20/23 [phone]

## 2023-01-20 NOTE — ED Triage Notes (Signed)
Pt arrived from home with [facility] for respiratory distress. Sob started last night, able to rest using home bipap; worsening sob this morning. Initial sats 82% on room air. Placed on 10L CPAP with sats increased to 92%. BLE edema noted, reports may be slightly worse than baseline.

## 2023-01-20 NOTE — Hospital Course (Addendum)
77 year old gentleman with paroxysmal atrial fibrillation, diastolic heart failure, heart murmur, hyperlipidemia, hypertension, OSA on CPAP, chronic urinary retention requiring self-catheterization, recently diagnosed partially obstructing rectal cancer, GERD and other PMH detailed below was seen in the emergency department yesterday for dyspnea and uncontrolled A-fib.  They were able to get his heart rate controlled in the ED.  His metoprolol was increased to 50 mg twice daily and he was started on diltiazem CD 120 mg.  He discharged home.  Overnight he developed acute severe SOB and presented back to ED by [facility] on bipap.  He was noted to be volume overloaded and given 80 mg IV furosemide and he diuresed over 2 liters.  He was weaned off bipap back down to room air.  He is being admitted for acute diastolic heart failure.

## 2023-01-20 NOTE — Plan of Care (Signed)

## 2023-01-20 NOTE — ED Provider Notes (Signed)
Patient urinated over 2 L.  He was breathing much easier.  He was put on 3 L nasal and did well.  He was admitted to medicine for CHF   Bethann Berkshire, MD 01/20/23 1001

## 2023-01-20 NOTE — ED Notes (Signed)
Removed from Bipap per MD request at 0840 and placed on 3lpm [place]. Patient stated he felt better and was breathing easier. His SPO2 was 96% on 3L.

## 2023-01-21 ENCOUNTER — Observation Stay (HOSPITAL_COMMUNITY): Payer: No Typology Code available for payment source

## 2023-01-21 DIAGNOSIS — I5031 Acute diastolic (congestive) heart failure: Secondary | ICD-10-CM | POA: Diagnosis not present

## 2023-01-21 DIAGNOSIS — Z87891 Personal history of nicotine dependence: Secondary | ICD-10-CM | POA: Diagnosis not present

## 2023-01-21 DIAGNOSIS — J810 Acute pulmonary edema: Secondary | ICD-10-CM

## 2023-01-21 DIAGNOSIS — J449 Chronic obstructive pulmonary disease, unspecified: Secondary | ICD-10-CM | POA: Diagnosis not present

## 2023-01-21 DIAGNOSIS — Z7901 Long term (current) use of anticoagulants: Secondary | ICD-10-CM | POA: Diagnosis not present

## 2023-01-21 LAB — CBC WITH DIFFERENTIAL/PLATELET
Abs Immature Granulocytes: 0.03 10*3/uL (ref 0.00–0.07)
Basophils Absolute: 0 10*3/uL (ref 0.0–0.1)
Basophils Relative: 0 %
Eosinophils Absolute: 0 10*3/uL (ref 0.0–0.5)
Eosinophils Relative: 0 %
HCT: 32.6 % — ABNORMAL LOW (ref 39.0–52.0)
Hemoglobin: 10.3 g/dL — ABNORMAL LOW (ref 13.0–17.0)
Immature Granulocytes: 0 %
Lymphocytes Relative: 13 %
Lymphs Abs: 1.2 10*3/uL (ref 0.7–4.0)
MCH: 27.8 pg (ref 26.0–34.0)
MCHC: 31.6 g/dL (ref 30.0–36.0)
MCV: 87.9 fL (ref 80.0–100.0)
Monocytes Absolute: 0.8 10*3/uL (ref 0.1–1.0)
Monocytes Relative: 9 %
Neutro Abs: 7 10*3/uL (ref 1.7–7.7)
Neutrophils Relative %: 78 %
Platelets: 169 10*3/uL (ref 150–400)
RBC: 3.71 MIL/uL — ABNORMAL LOW (ref 4.22–5.81)
RDW: 16 % — ABNORMAL HIGH (ref 11.5–15.5)
WBC: 9 10*3/uL (ref 4.0–10.5)
nRBC: 0 % (ref 0.0–0.2)

## 2023-01-21 LAB — BASIC METABOLIC PANEL
Anion gap: 8 (ref 5–15)
BUN: 18 mg/dL (ref 8–23)
CO2: 27 mmol/L (ref 22–32)
Calcium: 8.5 mg/dL — ABNORMAL LOW (ref 8.9–10.3)
Chloride: 103 mmol/L (ref 98–111)
Creatinine, Ser: 0.8 mg/dL (ref 0.61–1.24)
GFR, Estimated: >60 mL/min (ref >60)
Glucose, Bld: 129 mg/dL — ABNORMAL HIGH (ref 70–99)
Potassium: 3.7 mmol/L (ref 3.5–5.1)
Sodium: 138 mmol/L (ref 135–145)

## 2023-01-21 LAB — MAGNESIUM: Magnesium: 1.9 mg/dL (ref 1.7–2.4)

## 2023-01-21 LAB — BRAIN NATRIURETIC PEPTIDE: B Natriuretic Peptide: 470 pg/mL — ABNORMAL HIGH (ref 0.0–100.0)

## 2023-01-21 NOTE — Progress Notes (Addendum)
Patient alert and oriented x4. No complaints of pain, shortness of breath, chest pain, dizziness, nausea or vomiting. Patient up out of bed with assistance to chair and commode. Patient tolerated PO meds and diet well, appetite good. IV removed with NO complications. Went over discharge summary, follow up scheduled appointments and medication education. All questions answered and patient expressed full understanding of summary, appointments and education. Patient stated he understood how to use the oxygen tank and concentrator that was present in the room and stated that the DME representative showed him how to use it. Writer offered to go over it again with patient and patient stated there was no need, that he fully understood what to do and how to use it. Foley cath discontinued. Patient discharged with all belongings for home via [employer]. Home health PT was going to be added to discharge per Dr Laural Benes and case manager.

## 2023-01-21 NOTE — Progress Notes (Addendum)
SATURATION QUALIFICATIONS: (This note is used to comply with regulatory documentation for home oxygen)  Patient Saturations on Room Air at Rest = 86%  Patient Saturations on 2 Liters of oxygen = 93%  Please briefly explain why patient needs home oxygen: Desats on room air

## 2023-01-21 NOTE — Plan of Care (Signed)
°  Problem: Education: Goal: Ability to demonstrate management of disease process will improve Outcome: Progressing Goal: Ability to verbalize understanding of medication therapies will improve Outcome: Progressing Goal: Individualized Educational Video(s) Outcome: Progressing   Problem: Activity: Goal: Capacity to carry out activities will improve Outcome: Progressing   Problem: Cardiac: Goal: Ability to achieve and maintain adequate cardiopulmonary perfusion will improve Outcome: Progressing   Problem: Education: Goal: Knowledge of General Education information will improve Description: Including pain rating scale, medication(s)/side effects and non-pharmacologic comfort measures Outcome: Progressing   Problem: Health Behavior/Discharge Planning: Goal: Ability to manage health-related needs will improve Outcome: Progressing   Problem: Clinical Measurements: Goal: Ability to maintain clinical measurements within normal limits will improve Outcome: Progressing Goal: Will remain free from infection Outcome: Progressing Goal: Diagnostic test results will improve Outcome: Progressing Goal: Respiratory complications will improve Outcome: Progressing Goal: Cardiovascular complication will be avoided Outcome: Progressing   Problem: Activity: Goal: Risk for activity intolerance will decrease Outcome: Progressing   Problem: Nutrition: Goal: Adequate nutrition will be maintained Outcome: Progressing   Problem: Coping: Goal: Level of anxiety will decrease Outcome: Progressing   Problem: Elimination: Goal: Will not experience complications related to bowel motility Outcome: Progressing Goal: Will not experience complications related to urinary retention Outcome: Progressing   Problem: Pain Managment: Goal: General experience of comfort will improve Outcome: Progressing   Problem: Safety: Goal: Ability to remain free from injury will improve Outcome: Progressing

## 2023-01-21 NOTE — TOC Transition Note (Addendum)
Transition of Care [facility]) - CM/SW Discharge Note   Patient Details  Name: Gary Carroll MRN: [id number] Date of Birth: June 12, 1945  [facility] CM/SW Contact:  Leitha Bleak, RN Phone Number: 01/21/2023, 10:54 AM   Clinical Narrative:   CM spoke with patient, [facility]. TOC consulted for CHF. Patient has not had any education. He will talk to [place] at his next appointment. TOC added CHF education to AVS for him to review to ask question. Patient is qualifying for home Oxygen, note and order is in. Referral sent to [facility] with [employer]. He will work on [employer].  Team updated.   [facility]. He is agreeable to [facility] for CHF. Sarah with [facility] accepted the referral. MD aware to order.   Final next level of care: Home/Self Care Barriers to Discharge: Equipment Delay  Patient Goals and CMS Choice CMS Medicare.gov Compare Post Acute Care list provided to:: Patient Choice offered to / list presented to : Patient  Discharge Placement      Patient and family notified of of transfer: 01/21/23  Discharge Plan and Services Additional resources added to the After Visit Summary for                DME Arranged: Oxygen DME Agency: [employer] Date DME Agency Contacted: 01/21/23 Time DME Agency Contacted: 1054 Representative spoke with at DME Agency: Marthann Schiller   Social Determinants of Health (SDOH) Interventions SDOH Screenings   Food Insecurity: No Food Insecurity (01/20/2023)  Housing: Low Risk  (01/20/2023)  Transportation Needs: No Transportation Needs (01/20/2023)  Utilities: Not At Risk (01/20/2023)  Depression (PHQ2-9): Low Risk  (03/12/2019)  Tobacco Use: Medium Risk (01/20/2023)    Readmission Risk Interventions    02/07/2021    1:55 PM  Readmission Risk Prevention Plan  Transportation Screening Complete  PCP or Specialist Appt within 5-7 Days Complete  Home Care Screening Complete  Medication Review (RN CM)  Referral to Pharmacy

## 2023-01-21 NOTE — Progress Notes (Signed)
   01/21/23 0714  ReDS Vest / Clip  Station Marker D  Ruler Value 42  ReDS Value Range < 36  ReDS Actual Value 24

## 2023-01-21 NOTE — Discharge Summary (Signed)
Physician Discharge Summary  Gary Carroll ZOX:[id number] DOB: 1946/03/08 DOA: 01/20/2023  PCP: Benita Stabile, MD  Admit date: 01/20/2023 Discharge date: 01/21/2023  Admitted From:  Home  Disposition: Home   Recommendations for Outpatient Follow-up:  Follow up with PCP in 1 weeks [facility] with [facility], cardiologist and surgery team as arranged   Home Health:  DME Home Oxygen 2L/min   Discharge Condition: STABLE   CODE STATUS: FULL DIET: low sodium recommended   Brief Hospitalization Summary: Please see all hospital notes, images, labs for full details of the hospitalization. 77 year old gentleman with paroxysmal atrial fibrillation, diastolic heart failure, heart murmur, hyperlipidemia, hypertension, OSA on CPAP, chronic urinary retention requiring self-catheterization, recently diagnosed partially obstructing rectal cancer, GERD and other PMH detailed below was seen in the emergency department yesterday for dyspnea and uncontrolled A-fib.  They were able to get his heart rate controlled in the ED.  His metoprolol was increased to 50 mg twice daily and he was started on diltiazem CD 120 mg.  He discharged home.  Overnight he developed acute severe SOB and presented back to ED by [facility] on bipap.  He was noted to be volume overloaded and given 80 mg IV furosemide and he diuresed over 2 liters.  He was weaned off bipap back down to room air.  He is being admitted for acute diastolic heart failure.    Hospital Course  Pt was was admitted with acute HFpEF likely exacerbated by a bout of paroxysmal atrial fibrillation with RVR where he had been treated in the ED the night before.  He was given IV Lasix and has diuresed greater than 4 L since arrival.  He is feeling much better.  He wants to discharge home today.  His red vest reading today is 24.  He has stable labs.  He feels like he needs to go home today because he has an appointment tomorrow for CT scan and other studies in preparation  for treatment of his newly diagnosed rectal mass.  [facility]  He says he already has an appointment with cardiology as part of his preoperative workup and he is going to be following up with his oncologist and they are sending him to a surgeon as well.  Patient is being discharged home on 2 L of supplemental oxygen as he qualified when we did a home O2 eval screen.  He is being discharged home in stable condition.  Discharge Diagnoses:  Principal Problem:   Acute heart failure with preserved ejection fraction (HFpEF) (HCC) Active Problems:   Essential hypertension, benign   Mixed hyperlipidemia   Peripheral edema   GERD (gastroesophageal reflux disease)   AF (paroxysmal atrial fibrillation) (HCC)   Sleep apnea   History of tobacco use   Acute pulmonary edema (HCC)   Chronic anticoagulation   Obstructive lung disease (generalized) (HCC)   Acute respiratory failure with hypoxia [facility])   Discharge Instructions:  Allergies as of 01/21/2023       Reactions   Diovan [valsartan] Cough   Tagamet [cimetidine] Other (See Comments)   irrittability        Medication List     TAKE these medications    apixaban 5 MG Tabs tablet Commonly known as: ELIQUIS Take 1 tablet (5 mg total) by mouth 2 (two) times daily.   ascorbic acid 500 MG tablet Commonly known as: VITAMIN C Take 500 mg by mouth daily.   diltiazem 120 MG 24 hr capsule Commonly  known as: Cardizem CD Take 1 capsule (120 mg total) by mouth daily.   Fish Oil 1000 MG Caps Take 4 capsules by mouth daily.   furosemide 40 MG tablet Commonly known as: LASIX Take 1.5 tablets (60 mg total) by mouth daily.   loratadine 10 MG tablet Commonly known as: CLARITIN Take 10 mg by mouth daily as needed for allergies.   losartan 50 MG tablet Commonly known as: COZAAR Take 0.5 tablets (25 mg total) by mouth daily. What changed: how much to take   magnesium chloride 64 MG Tbec SR tablet Commonly known as:  SLOW-MAG Take 1 tablet by mouth daily.   melatonin 5 MG Tabs Take 10 mg by mouth at bedtime.   metoprolol tartrate 50 MG tablet Commonly known as: LOPRESSOR Take 1 tablet (50 mg total) by mouth 2 (two) times daily.   Multivital tablet Take 1 tablet by mouth daily.   oxybutynin 5 MG tablet Commonly known as: DITROPAN Take 5 mg by mouth every evening.   pantoprazole 40 MG tablet Commonly known as: PROTONIX Take 40 mg by mouth daily.   potassium chloride 10 MEQ tablet Commonly known as: KLOR-CON Take 1 tablet (10 mEq total) by mouth daily.   rosuvastatin 20 MG tablet Commonly known as: CRESTOR Take 20 mg by mouth daily.               Durable Medical Equipment  (From admission, onward)           Start     Ordered   01/21/23 1023  For home use only DME oxygen  Once       Question Answer Comment  Length of Need Lifetime   Mode or (Route) Nasal cannula   Liters per Minute 2   Frequency Continuous (stationary and portable oxygen unit needed)   Oxygen conserving device Yes   Oxygen delivery system Gas      01/21/23 1022            Follow-up Information     Benita Stabile, MD. Schedule an appointment as soon as possible for a visit in 1 week(s).   Specialty: Internal Medicine Why: Hospital Follow Up Contact information: [address] [place] [facility] [address] [phone]                Allergies  Allergen Reactions   Diovan [Valsartan] Cough   Tagamet [Cimetidine] Other (See Comments)    irrittability   Allergies as of 01/21/2023       Reactions   Diovan [valsartan] Cough   Tagamet [cimetidine] Other (See Comments)   irrittability        Medication List     TAKE these medications    apixaban 5 MG Tabs tablet Commonly known as: ELIQUIS Take 1 tablet (5 mg total) by mouth 2 (two) times daily.   ascorbic acid 500 MG tablet Commonly known as: VITAMIN C Take 500 mg by mouth daily.   diltiazem 120 MG 24 hr capsule Commonly  known as: Cardizem CD Take 1 capsule (120 mg total) by mouth daily.   Fish Oil 1000 MG Caps Take 4 capsules by mouth daily.   furosemide 40 MG tablet Commonly known as: LASIX Take 1.5 tablets (60 mg total) by mouth daily.   loratadine 10 MG tablet Commonly known as: CLARITIN Take 10 mg by mouth daily as needed for allergies.   losartan 50 MG tablet Commonly known as: COZAAR Take 0.5 tablets (25 mg total) by mouth daily.  What changed: how much to take   magnesium chloride 64 MG Tbec SR tablet Commonly known as: SLOW-MAG Take 1 tablet by mouth daily.   melatonin 5 MG Tabs Take 10 mg by mouth at bedtime.   metoprolol tartrate 50 MG tablet Commonly known as: LOPRESSOR Take 1 tablet (50 mg total) by mouth 2 (two) times daily.   Multivital tablet Take 1 tablet by mouth daily.   oxybutynin 5 MG tablet Commonly known as: DITROPAN Take 5 mg by mouth every evening.   pantoprazole 40 MG tablet Commonly known as: PROTONIX Take 40 mg by mouth daily.   potassium chloride 10 MEQ tablet Commonly known as: KLOR-CON Take 1 tablet (10 mEq total) by mouth daily.   rosuvastatin 20 MG tablet Commonly known as: CRESTOR Take 20 mg by mouth daily.               Durable Medical Equipment  (From admission, onward)           Start     Ordered   01/21/23 1023  For home use only DME oxygen  Once       Question Answer Comment  Length of Need Lifetime   Mode or (Route) Nasal cannula   Liters per Minute 2   Frequency Continuous (stationary and portable oxygen unit needed)   Oxygen conserving device Yes   Oxygen delivery system Gas      01/21/23 1022            Procedures/Studies: DG Chest Port 1 View  Result Date: 01/21/2023 CLINICAL DATA:  Acute heart failure EXAM: PORTABLE CHEST 1 VIEW COMPARISON:  Yesterday FINDINGS: Diffuse interstitial opacity with some Kerley lines and cephalized blood flow, interlobular septal thickening seen on 01/17/2023 chest CT. Stable  heart size and mediastinal contours. No visible effusion or pneumothorax. IMPRESSION: History of CHF with unchanged interstitial opacity. Electronically Signed   By: Tiburcio Pea M.D.   On: 01/21/2023 05:40   DG Chest Port 1 View  Result Date: 01/20/2023 CLINICAL DATA:  77 year old male with history of shortness of breath. EXAM: PORTABLE CHEST 1 VIEW COMPARISON:  Chest x-ray 12/20/2022. FINDINGS: Lung volumes are normal. Diffuse interstitial prominence and widespread peribronchial cuffing, concerning for potential acute bronchitis. Patchy ill-defined opacities are noted scattered randomly throughout the lungs bilaterally. No pleural effusions. No pneumothorax. No pulmonary nodule or mass noted. Pulmonary vasculature and the cardiomediastinal silhouette are within normal limits. Atherosclerosis in the thoracic aorta. IMPRESSION: 1. The appearance of the chest suggests possible bronchitis, and developing multilobar bronchopneumonia, as above. 2. Aortic atherosclerosis. Electronically Signed   By: Trudie Reed M.D.   On: 01/20/2023 07:33   CT Angio Chest PE W and/or Wo Contrast  Result Date: 01/17/2023 CLINICAL DATA:  Pulmonary embolism (PE) suspected, high prob Weakness.  History of colon cancer. EXAM: CT ANGIOGRAPHY CHEST WITH CONTRAST TECHNIQUE: Multidetector CT imaging of the chest was performed using the standard protocol during bolus administration of intravenous contrast. Multiplanar CT image reconstructions and MIPs were obtained to evaluate the vascular anatomy. RADIATION DOSE REDUCTION: This exam was performed according to the departmental dose-optimization program which includes automated exposure control, adjustment of the mA and/or kV according to patient size and/or use of iterative reconstruction technique. CONTRAST:  OMNIPAQUE IOHEXOL 350 MG/ML SOLN COMPARISON:  Chest CTA 12/21/2022.  Additional priors reviewed. FINDINGS: Cardiovascular: There are no filling defects within the  pulmonary arteries to suggest pulmonary embolus. Atherosclerosis of the thoracic aorta without aneurysm or acute aortic finding. Mild  chronic cardiomegaly. Coronary artery calcifications. No pericardial effusion. Mediastinum/Nodes: Decreased size of many of the previously enlarged mediastinal lymph nodes. For example 11 mm right subcarinal/low hilar node measures 11 mm, series 4, image 60 previously 14 mm. AP window node measures 15 mm series 4, image 42, previously 17 mm. Multiple additional prominent and mildly enlarged mediastinal and hilar nodes are stable. No progressive adenopathy. No esophageal wall thickening. Lungs/Pleura: Stable appearance of consolidative left apical opacity from prior exam. Adjacent nodular densities measuring 9 mm series 6, image 42, grossly unchanged. There is smooth bilateral apical septal thickening, mildly improved. No acute airspace disease. Chronic bronchial thickening. No endobronchial lesion or debris. No pleural fluid or pneumothorax. Upper Abdomen: Unchanged 15 mm left adrenal nodule. Cholecystectomy. No acute upper abdominal findings. Musculoskeletal: Stable presumed Schmorl's node superior endplate of T11. Additional degenerative change and Schmorl's nodes in the thoracic spine. Scoliosis. Review of the MIP images confirms the above findings. IMPRESSION: 1. No pulmonary embolus. 2. Stable appearance of consolidative left apical opacity from prior exam. Adjacent nodular densities measuring 9 mm, grossly unchanged. 3. Decreased size of many of the previously enlarged mediastinal lymph nodes. Multiple additional prominent and mildly enlarged mediastinal and hilar nodes are stable. 4. Unchanged left adrenal nodule, likely adenoma. Aortic Atherosclerosis (ICD10-I70.0). Electronically Signed   By: Narda Rutherford M.D.   On: 01/17/2023 22:27     Subjective: Pt says he feels well and needs to go home today, not willing to stay another night because he has to get CT scan and  other things tomorrow preparing for treatment of his newly diagnosed rectal mass.    Discharge Exam: Vitals:   01/21/23 1027 01/21/23 1028  BP:    Pulse:    Resp:    Temp:    SpO2: 93% 93%   Vitals:   01/21/23 1011 01/21/23 1012 01/21/23 1027 01/21/23 1028  BP:      Pulse:      Resp:      Temp:      TempSrc:      SpO2: (!) 87% (!) 86% 93% 93%  Weight:      Height:       General: Pt is alert, awake, not in acute distress Cardiovascular: normal S1/S2 +, no rubs, no gallops Respiratory: CTA bilaterally, no wheezing, no rhonchi Abdominal: Soft, NT, ND, bowel sounds + Extremities: chronic venous stasis edema, no cyanosis   The results of significant diagnostics from this hospitalization (including imaging, microbiology, ancillary and laboratory) are listed below for reference.     Microbiology: Recent Results (from the past 240 hour(s))  SARS Coronavirus 2 by RT PCR (hospital order, performed in [facility] hospital lab) *cepheid single result test* Anterior Nasal Swab     Status: None   Collection Time: 01/20/23  6:57 AM   Specimen: Anterior Nasal Swab  Result Value Ref Range Status   SARS Coronavirus 2 by RT PCR NEGATIVE NEGATIVE Final    Comment: (NOTE) SARS-CoV-2 target nucleic acids are NOT DETECTED.  The SARS-CoV-2 RNA is generally detectable in upper and lower respiratory specimens during the acute phase of infection. The lowest concentration of SARS-CoV-2 viral copies this assay can detect is 250 copies / mL. A negative result does not preclude SARS-CoV-2 infection and should not be used as the sole basis for treatment or other patient management decisions.  A negative result may occur with improper specimen collection / handling, submission of specimen other than nasopharyngeal swab, presence of viral mutation(s) within the  areas targeted by this assay, and inadequate number of viral copies (<250 copies / mL). A negative result must be combined with  clinical observations, patient history, and epidemiological information.  Fact Sheet for Patients:   RoadLapTop.co.za  Fact Sheet for Healthcare Providers: http://kim-miller.com/  This test is not yet approved or  cleared by the [place] FDA and has been authorized for detection and/or diagnosis of SARS-CoV-2 by FDA under an Emergency Use Authorization (EUA).  This EUA will remain in effect (meaning this test can be used) for the duration of the COVID-19 declaration under Section 564(b)(1) of the Act, 21 U.S.C. section 360bbb-3(b)(1), unless the authorization is terminated or revoked sooner.  Performed at [facility], [address] [place], [place] [address]   Culture, blood (Routine X 2) w Reflex to ID Panel     Status: None (Preliminary result)   Collection Time: 01/20/23 10:38 AM   Specimen: BLOOD RIGHT ARM  Result Value Ref Range Status   Specimen Description BLOOD RIGHT ARM  Final   Special Requests   Final    BOTTLES DRAWN AEROBIC AND ANAEROBIC Blood Culture results may not be optimal due to an excessive volume of blood received in culture bottles   Culture   Final    NO GROWTH < 24 HOURS Performed at [facility], [address] [place], [place] [address]    Report Status PENDING  Incomplete  Culture, blood (Routine X 2) w Reflex to ID Panel     Status: None (Preliminary result)   Collection Time: 01/20/23 10:39 AM   Specimen: Site Not Specified; Blood  Result Value Ref Range Status   Specimen Description SITE NOT SPECIFIED  Final   Special Requests   Final    BOTTLES DRAWN AEROBIC AND ANAEROBIC Blood Culture results may not be optimal due to an excessive volume of blood received in culture bottles   Culture   Final    NO GROWTH < 24 HOURS Performed at [facility], [address], [place], [place] [address]    Report Status PENDING  Incomplete  MRSA Next Gen by PCR, Nasal     Status: None   Collection Time: 01/20/23  11:34 AM   Specimen: Nasal Mucosa; Nasal Swab  Result Value Ref Range Status   MRSA by PCR Next Gen NOT DETECTED NOT DETECTED Final    Comment: (NOTE) The GeneXpert MRSA Assay (FDA approved for NASAL specimens only), is one component of a comprehensive MRSA colonization surveillance program. It is not intended to diagnose MRSA infection nor to guide or monitor treatment for MRSA infections. Test performance is not FDA approved in patients less than 18 years old. Performed at [facility], [address], [place], [place] [address]      Labs: BNP (last 3 results) Recent Labs    12/20/22 2305 01/20/23 0655 01/21/23 0521  BNP 533.0* 417.0* 470.0*   Basic Metabolic Panel: Recent Labs  Lab 01/17/23 1946 01/20/23 0655 01/20/23 1346 01/21/23 0521  NA 140 136  --  138  K 3.9 4.1 3.8 3.7  CL 109 109  --  103  CO2 21* 21*  --  27  GLUCOSE 178* 144*  --  129*  BUN 18 23  --  18  CREATININE 0.89 0.87  --  0.80  CALCIUM 8.7* 8.8*  --  8.5*  MG  --   --   --  1.9   Liver Function Tests: Recent Labs  Lab 01/17/23 1946 01/20/23 0749  AST  32 30  ALT 33 31  ALKPHOS 62 55  BILITOT 0.6 0.9  PROT 6.4* 6.7  ALBUMIN 3.3* 3.2*   No results for input(s): "LIPASE", "AMYLASE" in the last 168 hours. No results for input(s): "AMMONIA" in the last 168 hours. CBC: Recent Labs  Lab 01/17/23 1946 01/20/23 0655 01/21/23 0521  WBC 6.9 12.5* 9.0  NEUTROABS 4.2 10.0* 7.0  HGB 11.9* 11.9* 10.3*  HCT 38.1* 38.1* 32.6*  MCV 88.6 88.4 87.9  PLT 197 225 169   Cardiac Enzymes: No results for input(s): "CKTOTAL", "CKMB", "CKMBINDEX", "TROPONINI" in the last 168 hours. BNP: Invalid input(s): "POCBNP" CBG: No results for input(s): "GLUCAP" in the last 168 hours. D-Dimer No results for input(s): "DDIMER" in the last 72 hours. Hgb A1c No results for input(s): "HGBA1C" in the last 72 hours. Lipid Profile No results for input(s): "CHOL", "HDL", "LDLCALC", "TRIG", "CHOLHDL", "LDLDIRECT" in  the last 72 hours. Thyroid function studies No results for input(s): "TSH", "T4TOTAL", "T3FREE", "THYROIDAB" in the last 72 hours.  Invalid input(s): "FREET3" Anemia work up No results for input(s): "VITAMINB12", "FOLATE", "FERRITIN", "TIBC", "IRON", "RETICCTPCT" in the last 72 hours. Urinalysis    Component Value Date/Time   COLORURINE YELLOW 01/17/2023 2251   APPEARANCEUR CLEAR 01/17/2023 2251   LABSPEC 1.032 (H) 01/17/2023 2251   PHURINE 5.0 01/17/2023 2251   GLUCOSEU NEGATIVE 01/17/2023 2251   HGBUR NEGATIVE 01/17/2023 2251   BILIRUBINUR NEGATIVE 01/17/2023 2251   BILIRUBINUR neg 08/31/2012 1203   KETONESUR NEGATIVE 01/17/2023 2251   PROTEINUR NEGATIVE 01/17/2023 2251   UROBILINOGEN negative 08/31/2012 1203   UROBILINOGEN 0.2 05/31/2010 1125   NITRITE NEGATIVE 01/17/2023 2251   LEUKOCYTESUR LARGE (A) 01/17/2023 2251   Sepsis Labs Recent Labs  Lab 01/17/23 1946 01/20/23 0655 01/21/23 0521  WBC 6.9 12.5* 9.0   Microbiology Recent Results (from the past 240 hour(s))  SARS Coronavirus 2 by RT PCR (hospital order, performed in [facility] hospital lab) *cepheid single result test* Anterior Nasal Swab     Status: None   Collection Time: 01/20/23  6:57 AM   Specimen: Anterior Nasal Swab  Result Value Ref Range Status   SARS Coronavirus 2 by RT PCR NEGATIVE NEGATIVE Final    Comment: (NOTE) SARS-CoV-2 target nucleic acids are NOT DETECTED.  The SARS-CoV-2 RNA is generally detectable in upper and lower respiratory specimens during the acute phase of infection. The lowest concentration of SARS-CoV-2 viral copies this assay can detect is 250 copies / mL. A negative result does not preclude SARS-CoV-2 infection and should not be used as the sole basis for treatment or other patient management decisions.  A negative result may occur with improper specimen collection / handling, submission of specimen other than nasopharyngeal swab, presence of viral mutation(s) within  the areas targeted by this assay, and inadequate number of viral copies (<250 copies / mL). A negative result must be combined with clinical observations, patient history, and epidemiological information.  Fact Sheet for Patients:   RoadLapTop.co.za  Fact Sheet for Healthcare Providers: http://kim-miller.com/  This test is not yet approved or  cleared by the [place] FDA and has been authorized for detection and/or diagnosis of SARS-CoV-2 by FDA under an Emergency Use Authorization (EUA).  This EUA will remain in effect (meaning this test can be used) for the duration of the COVID-19 declaration under Section 564(b)(1) of the Act, 21 U.S.C. section 360bbb-3(b)(1), unless the authorization is terminated or revoked sooner.  Performed at [facility], [address], [place],  [place] [address]   Culture, blood (Routine X 2) w Reflex to ID Panel     Status: None (Preliminary result)   Collection Time: 01/20/23 10:38 AM   Specimen: BLOOD RIGHT ARM  Result Value Ref Range Status   Specimen Description BLOOD RIGHT ARM  Final   Special Requests   Final    BOTTLES DRAWN AEROBIC AND ANAEROBIC Blood Culture results may not be optimal due to an excessive volume of blood received in culture bottles   Culture   Final    NO GROWTH < 24 HOURS Performed at [facility], [address] [place], [place] [address]    Report Status PENDING  Incomplete  Culture, blood (Routine X 2) w Reflex to ID Panel     Status: None (Preliminary result)   Collection Time: 01/20/23 10:39 AM   Specimen: Site Not Specified; Blood  Result Value Ref Range Status   Specimen Description SITE NOT SPECIFIED  Final   Special Requests   Final    BOTTLES DRAWN AEROBIC AND ANAEROBIC Blood Culture results may not be optimal due to an excessive volume of blood received in culture bottles   Culture   Final    NO GROWTH < 24 HOURS Performed at [facility], [address],  [place], [place] [address]    Report Status PENDING  Incomplete  MRSA Next Gen by PCR, Nasal     Status: None   Collection Time: 01/20/23 11:34 AM   Specimen: Nasal Mucosa; Nasal Swab  Result Value Ref Range Status   MRSA by PCR Next Gen NOT DETECTED NOT DETECTED Final    Comment: (NOTE) The GeneXpert MRSA Assay (FDA approved for NASAL specimens only), is one component of a comprehensive MRSA colonization surveillance program. It is not intended to diagnose MRSA infection nor to guide or monitor treatment for MRSA infections. Test performance is not FDA approved in patients less than 51 years old. Performed at [facility], [address], [place], [place] [address]    Time coordinating discharge:  40 mins  SIGNED:  Standley Dakins, MD  [facility] 01/21/2023, 11:02 AM How to contact the [facility] Attending or Consulting provider 7A - 7P or covering provider during after hours 7P -7A, for this patient?  Check the care team in [facility] and look for a) attending/consulting TR[facility] provider listed and b) the [facility] team listed Log into [email or web address] and use [facility]'s universal password to access. If you do not have the password, please contact the hospital operator. Locate the [facility] provider you are looking for under [facility] and page to a number that you can be directly reached. If you still have difficulty reaching the provider, please page the Operating Room Services (Director on Call) for the Hospitalists listed on amion for assistance.

## 2023-01-21 NOTE — Discharge Instructions (Signed)
IMPORTANT INFORMATION: PAY CLOSE ATTENTION   PHYSICIAN DISCHARGE INSTRUCTIONS  Follow with Primary care provider  Hall, John Z, MD  and other consultants as instructed by your Hospitalist Physician  SEEK MEDICAL CARE OR RETURN TO EMERGENCY ROOM IF SYMPTOMS COME BACK, WORSEN OR NEW PROBLEM DEVELOPS   Please note: You were cared for by a hospitalist during your hospital stay. Every effort will be made to forward records to your primary care provider.  You can request that your primary care provider send for your hospital records if they have not received them.  Once you are discharged, your primary care physician will handle any further medical issues. Please note that NO REFILLS for any discharge medications will be authorized once you are discharged, as it is imperative that you return to your primary care physician (or establish a relationship with a primary care physician if you do not have one) for your post hospital discharge needs so that they can reassess your need for medications and monitor your lab values.  Please get a complete blood count and chemistry panel checked by your Primary MD at your next visit, and again as instructed by your Primary MD.  Get Medicines reviewed and adjusted: Please take all your medications with you for your next visit with your Primary MD  Laboratory/radiological data: Please request your Primary MD to go over all hospital tests and procedure/radiological results at the follow up, please ask your primary care provider to get all Hospital records sent to his/her office.  In some cases, they will be blood work, cultures and biopsy results pending at the time of your discharge. Please request that your primary care provider follow up on these results.  If you are diabetic, please bring your blood sugar readings with you to your follow up appointment with primary care.    Please call and make your follow up appointments as soon as possible.    Also Note the  following: If you experience worsening of your admission symptoms, develop shortness of breath, life threatening emergency, suicidal or homicidal thoughts you must seek medical attention immediately by calling 911 or calling your MD immediately  if symptoms less severe.  You must read complete instructions/literature along with all the possible adverse reactions/side effects for all the Medicines you take and that have been prescribed to you. Take any new Medicines after you have completely understood and accpet all the possible adverse reactions/side effects.   Do not drive when taking Pain medications or sleeping medications (Benzodiazepines)  Do not take more than prescribed Pain, Sleep and Anxiety Medications. It is not advisable to combine anxiety,sleep and pain medications without talking with your primary care practitioner  Special Instructions: If you have smoked or chewed Tobacco  in the last 2 yrs please stop smoking, stop any regular Alcohol  and or any Recreational drug use.  Wear Seat belts while driving.  Do not drive if taking any narcotic, mind altering or controlled substances or recreational drugs or alcohol.       

## 2023-01-22 ENCOUNTER — Other Ambulatory Visit: Payer: No Typology Code available for payment source

## 2023-01-23 DIAGNOSIS — R0602 Shortness of breath: Secondary | ICD-10-CM | POA: Diagnosis not present

## 2023-01-23 DIAGNOSIS — J9601 Acute respiratory failure with hypoxia: Secondary | ICD-10-CM | POA: Diagnosis not present

## 2023-01-23 NOTE — Progress Notes (Addendum)
Cardiology Office Note    Patient Name: Gary Carroll Date of Encounter: 01/24/2023  Primary Care Provider:  Benita Stabile, MD Primary Cardiologist:  Prentice Docker, MD (Inactive) Primary Electrophysiologist: None   Past Medical History    Past Medical History:  Diagnosis Date   A-fib [facility])    Allergic rhinitis    Arthritis    Asthma    seasonal per pt   Bilateral knee pain    OA   Diverticulosis 02/2012   seen on colonoscopy   Erectile dysfunction    GERD (gastroesophageal reflux disease)    Heart murmur    Hiatal hernia    HLD (hyperlipidemia)    HTN (hypertension)    Hydronephrosis determined by ultrasound 08/22/2015   Overactive bladder    Prediabetes    Pulmonary lesion    Ringing of ears    Sleep apnea    Urinary frequency    Urinary urgency    Wears glasses     History of Present Illness  Gary Carroll is a 77 y.o. male with a PMH of paroxysmal AF (on Eliquis), HFpEF, moderate AS, rectal cancer, OSA (on CPAP), HLD, HTN, GERD who presents today for post hospital follow-up.  Gary Carroll was seen initially in 04/2010 for preoperative risk evaluation.  He underwent a nuclear stress test that was normal.  He was seen in 09/2015 for evaluation of atrial fibrillation.  He was started on Eliquis and provided rate control with metoprolol.  2D echo was completed showing EF of 60 to 65% with mild LVH and grade 1 DD with mild to moderate aortic stenosis.  He was last seen by Dr. Anne Fu on 03/14/2021 hospitalization due to septic shock and UTI.  He is also followed at the [facility] by cardiologist as well.  He was seen in the ED on 01/17/2023 with complaint of irregular heartbeat AF with RVR.Marland Kitchen  He was treated with metoprolol and rate was controlled.  He presented back to the ED on 01/20/2023 complaint of respiratory distress and O2 sat of 82% on arrival.  He was placed on CPAP.  He was found to be in AF with RVR placed on Cardizem with rate control achieved.  He was also noted  to be volume overloaded with shortness of breath and diuresed over 2 L with IV Lasix.  He was admitted for observation and discharged on 01/21/2023 after being weaned off BiPAP down to room air.  During today's visit the patient reports that he is not experiencing any episodes of palpitations but he is still having complaints of fatigue with shortness of breath.  His blood pressure today is controlled at 114/58 and heart rate is 78 bpm.  He has chronic lower extremity edema and volume status difficult to ascertain due to body habitus.  He was seen recently by his cardiologist at the [facility] due worsening aortic stenosis seen on recent 2D echo.  He was also seen by gastroenterology due to increased pain with sitting.  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  He is currently being followed by oncology with full body scan and treatment plan still pending.  He is compliant with his current medications and denies any adverse reactions.  During today's visit we discussed the pathophysiology of aortic stenosis and the importance of abstaining from excess salt in his diet.  Patient denies chest pain, palpitations, dyspnea, PND, orthopnea, nausea, vomiting, dizziness, syncope, edema,  weight gain, or early satiety.  -Please see assessment and plan for most recent echo result under #1.  Review of Systems  Please see the history of present illness.    All other systems reviewed and are otherwise negative except as noted above.  Physical Exam    Wt Readings from Last 3 Encounters:  01/24/23 267 lb 1.6 oz (121.2 kg)  01/21/23 208 lb 8.9 oz (94.6 kg)  01/17/23 260 lb (117.9 kg)   VS: Vitals:   01/24/23 1050  BP: (!) 114/54  Pulse: 78  SpO2: 98%  ,Body mass index is 37.25 kg/m. GEN: Well nourished, well developed in no acute distress Neck: No JVD; No carotid bruits Pulmonary: Clear to auscultation without rales, wheezing or rhonchi   Cardiovascular: Normal rate. Regular rhythm. Normal S1. Normal S2.   Murmurs: 3/4 harsh systolic murmur ABDOMEN: Soft, non-tender, non-distended EXTREMITIES: +2/3 lower extremity edema chronic  EKG/LABS/ Recent Cardiac Studies   ECG personally reviewed by me today -none completed today  Risk Assessment/Calculations:    CHA2DS2-VASc Score = 5   This indicates a 7.2% annual risk of stroke. The patient's score is based upon: CHF History: 1 HTN History: 1 Diabetes History: 0 Stroke History: 0 Vascular Disease History: 1 Age Score: 2 Gender Score: 0         Lab Results  Component Value Date   WBC 9.0 01/21/2023   HGB 10.3 (L) 01/21/2023   HCT 32.6 (L) 01/21/2023   MCV 87.9 01/21/2023   PLT 169 01/21/2023   Lab Results  Component Value Date   CREATININE 0.80 01/21/2023   BUN 18 01/21/2023   NA 138 01/21/2023   K 3.7 01/21/2023   CL 103 01/21/2023   CO2 27 01/21/2023   Lab Results  Component Value Date   CHOL 139 02/06/2012   HDL 35 (L) 02/06/2012   LDLCALC 86 02/06/2012   TRIG 92 02/06/2012   CHOLHDL 4.0 02/06/2012    Lab Results  Component Value Date   HGBA1C 6.0 (H) 02/03/2021   Assessment & Plan    1.  HFpEF: -TTE completed 12/21/2022 with EF of 60 to 65% with no RWMA and LVEDP moderately dilated LA, moderate to severe mitral annular calcification, severe aortic stenosis with gradient of 41.0 -Patient denies any presyncope or chest pain and case discussed with DOD Dr. Lynnette Caffey who recommends referral to structural heart team. -He was advised to abstain from excess salt elevate extremities when dependent -Patient can take additional 40 mg of Lasix for increased shortness of breath and weight gain. -Continue Lasix 60 mg daily -Low sodium diet, fluid restriction <2L, and daily weights encouraged. Educated to contact our office for weight gain of 2 lbs overnight or 5 lbs in one week.   ECHO ===== [facility] 12/21/2022 IMPRESSIONS 1. Left ventricular ejection  fraction, by estimation, is 60 to 65%. The left  ventricle has normal function. The left ventricle has no regional wall motion  abnormalities. There is moderate left ventricular hypertrophy. Left ventricular  diastolic parameters are indeterminate. Elevated left ventricular end-diastolic pressure. 2. Right ventricular systolic function is normal. The right ventricular size is  normal. Tricuspid regurgitation signal is inadequate for assessing PA pressure. 3. Left atrial size was moderately dilated. 4. The mitral valve is abnormal. No evidence of mitral valve regurgitation. No  evidence of mitral stenosis. Moderate to severe mitral annular calcification. 5. The aortic valve is tricuspid. There is severe calcifcation of the aortic  valve. Aortic valve regurgitation is mild. Severe  aortic valve stenosis. Aortic  valve area, by VTI measures 0.93 cm?Marland Kitchen Aortic valve mean gradient measures 41.0  mmHg. Aortic valve Vmax measures 4.19 m/s. DVI is 0.26. 6. The inferior vena cava is normal in size with <50% respiratory variability,  suggesting right atrial pressure of 8 mmHg. 7. Increased flow velocities may be secondary to anemia, thyrotoxicosis,  hyperdynamic or high flow state.  Comparison(s): Changes from prior study are noted. AS progressed from moderate  in 2022 to severe now.   2.  Paroxysmal AF: -Has controlled rate today with Lopressor 50 mg twice daily -Continue Eliquis 5 mg twice daily and Cardizem 120 mg daily -CHA2DS2-VASc Score = 5 [CHF History: 1, HTN History: 1, Diabetes History: 0, Stroke History: 0, Vascular Disease History: 1, Age Score: 2, Gender Score: 0].  Therefore, the patient's annual risk of stroke is 7.2 %.      3.  Essential hypertension: -Patient's blood pressure today is controlled at 114/54 -Continue Cardizem 120 mg daily, losartan 25 mg daily, Lopressor 50 mg twice daily  4.  Nonrheumatic aortic stenosis: -Patient recently referred by cardiologist at [facility] due to  worsening aortic stenosis on 2D echo completed 12/21/2022 -Ambulatory referral to structural heart team for further evaluation -Continue Lasix 60 mg daily  Disposition: Follow-up with Prentice Docker, MD (Inactive) or APP per treatment team   Signed, Napoleon Form, Leodis Rains, NP 01/24/2023, 12:38 PM [facility]

## 2023-01-24 ENCOUNTER — Ambulatory Visit: Payer: No Typology Code available for payment source | Attending: Nurse Practitioner | Admitting: Nurse Practitioner

## 2023-01-24 ENCOUNTER — Encounter: Payer: Self-pay | Admitting: Nurse Practitioner

## 2023-01-24 VITALS — BP 114/54 | HR 78 | Ht 71.0 in | Wt 267.1 lb

## 2023-01-24 DIAGNOSIS — I10 Essential (primary) hypertension: Secondary | ICD-10-CM | POA: Diagnosis not present

## 2023-01-24 DIAGNOSIS — I480 Paroxysmal atrial fibrillation: Secondary | ICD-10-CM | POA: Diagnosis not present

## 2023-01-24 DIAGNOSIS — I350 Nonrheumatic aortic (valve) stenosis: Secondary | ICD-10-CM

## 2023-01-24 DIAGNOSIS — I5032 Chronic diastolic (congestive) heart failure: Secondary | ICD-10-CM

## 2023-01-24 MED ORDER — FUROSEMIDE 40 MG PO TABS: 60.0000 mg | ORAL_TABLET | Freq: Every day | ORAL | 2 refills | Status: DC

## 2023-01-24 NOTE — Addendum Note (Signed)
Addended by: Elizabeth Palau on: 01/24/2023 05:35 PM   Modules accepted: Level of Service

## 2023-01-24 NOTE — Patient Instructions (Signed)
Medication Instructions:  You can take an additional 40mg  of Lasix as needed for increased shortness of breath and swelling *If you need a refill on your cardiac medications before your next appointment, please call your pharmacy*   Lab Work: None ordered   Testing/Procedures: None ordered   Follow-Up: At [facility], you and your health needs are our priority.  As part of our continuing mission to provide you with exceptional heart care, we have created designated Provider Care Teams.  These Care Teams include your primary Cardiologist (physician) and Advanced Practice Providers (APPs -  Physician Assistants and Nurse Practitioners) who all work together to provide you with the care you need, when you need it.  We recommend signing up for the patient portal called "MyChart".  Sign up information is provided on this After Visit Summary.  MyChart is used to connect with patients for Virtual Visits (Telemedicine).  Patients are able to view lab/test results, encounter notes, upcoming appointments, etc.  Non-urgent messages can be sent to your provider as well.   To learn more about what you can do with MyChart, go to [email or web address].    Your next appointment:   Follow up with Structural Heart Team   Provider:   Alverda Skeans, MD    Other Instructions You have been referred to Structural Heart

## 2023-01-25 LAB — CULTURE, BLOOD (ROUTINE X 2)
Culture: NO GROWTH
Culture: NO GROWTH

## 2023-01-27 DIAGNOSIS — I2510 Atherosclerotic heart disease of native coronary artery without angina pectoris: Secondary | ICD-10-CM | POA: Diagnosis not present

## 2023-01-27 DIAGNOSIS — G4733 Obstructive sleep apnea (adult) (pediatric): Secondary | ICD-10-CM | POA: Diagnosis not present

## 2023-01-27 DIAGNOSIS — E782 Mixed hyperlipidemia: Secondary | ICD-10-CM | POA: Diagnosis not present

## 2023-01-27 DIAGNOSIS — I350 Nonrheumatic aortic (valve) stenosis: Secondary | ICD-10-CM | POA: Diagnosis not present

## 2023-01-27 DIAGNOSIS — I5042 Chronic combined systolic (congestive) and diastolic (congestive) heart failure: Secondary | ICD-10-CM | POA: Diagnosis not present

## 2023-01-27 DIAGNOSIS — J9601 Acute respiratory failure with hypoxia: Secondary | ICD-10-CM | POA: Diagnosis not present

## 2023-01-27 DIAGNOSIS — N401 Enlarged prostate with lower urinary tract symptoms: Secondary | ICD-10-CM | POA: Diagnosis not present

## 2023-01-27 DIAGNOSIS — C189 Malignant neoplasm of colon, unspecified: Secondary | ICD-10-CM | POA: Diagnosis not present

## 2023-01-27 DIAGNOSIS — I10 Essential (primary) hypertension: Secondary | ICD-10-CM | POA: Diagnosis not present

## 2023-01-27 DIAGNOSIS — I480 Paroxysmal atrial fibrillation: Secondary | ICD-10-CM | POA: Diagnosis not present

## 2023-01-27 DIAGNOSIS — R339 Retention of urine, unspecified: Secondary | ICD-10-CM | POA: Diagnosis not present

## 2023-01-27 DIAGNOSIS — I110 Hypertensive heart disease with heart failure: Secondary | ICD-10-CM | POA: Diagnosis not present

## 2023-01-27 DIAGNOSIS — G4700 Insomnia, unspecified: Secondary | ICD-10-CM | POA: Insufficient documentation

## 2023-01-28 ENCOUNTER — Encounter: Payer: Self-pay | Admitting: Emergency Medicine

## 2023-01-28 ENCOUNTER — Ambulatory Visit (INDEPENDENT_AMBULATORY_CARE_PROVIDER_SITE_OTHER): Payer: No Typology Code available for payment source | Admitting: Emergency Medicine

## 2023-01-28 VITALS — BP 130/70 | HR 67 | Ht 71.0 in | Wt 263.6 lb

## 2023-01-28 DIAGNOSIS — G4730 Sleep apnea, unspecified: Secondary | ICD-10-CM | POA: Diagnosis not present

## 2023-01-28 DIAGNOSIS — J449 Chronic obstructive pulmonary disease, unspecified: Secondary | ICD-10-CM

## 2023-01-28 DIAGNOSIS — R918 Other nonspecific abnormal finding of lung field: Secondary | ICD-10-CM | POA: Diagnosis not present

## 2023-01-28 DIAGNOSIS — J9601 Acute respiratory failure with hypoxia: Secondary | ICD-10-CM | POA: Diagnosis not present

## 2023-01-28 LAB — SURGICAL PATHOLOGY

## 2023-01-28 NOTE — Assessment & Plan Note (Signed)
He was admitted with an episode of decompensation from his diastolic CHF and was sent home on oxygen 2 L/min.  He is unclear whether he still needs this.  We will perform a walking oximetry today on room air.  If he desaturates then we will give him instructions on when to use the oxygen.  If he does not desaturate then he would like for [place] to discontinue the oxygen through his DME.

## 2023-01-28 NOTE — Assessment & Plan Note (Signed)
Overall his lung nodules are stable.  The left upper lobe nodules have not changed, are smaller compared with more remote scans back to 2022.  He does have a stable left apical opacity, question scar.  He will need repeat imaging in about 6 months.  This may end up being done by oncology since he will be treated and followed for colon cancer.

## 2023-01-28 NOTE — H&P (View-Only) (Signed)
Patient ID: Gary Carroll MRN: [id number] DOB/AGE: [birth year] 77 y.o.  Primary Care Physician:Hall, Kathleene Hazel, MD Primary Cardiologist:  [facility]  FOCUSED CARDIOVASCULAR PROBLEM LIST:   Severe aortic stenosis with aortic valve area 0.93 cm2, mean gradient of 41 mmHg, and peak velocity of 4.2 m/s with ejection fraction of 60 to 65% TTE  August 2024; EKG demonstrates sinus rhythm without bundle-branch blocks Paroxysmal atrial fibrillation on Eliquis Obstructive sleep apnea on CPAP Hyperlipidemia Hypertension BMI 37 Newly diagnosed rectal mass undergoing evaluation T2DM, diet controlled  HISTORY OF PRESENT ILLNESS: The patient is a 77 y.o. male with the indicated medical history here for for recommendations regarding shortness of breath and recently diagnosed severe aortic stenosis.  The patient was seen in our office last week with complaints of increasing shortness of breath.  He had an echocardiogram done by his [place] cardiologist which demonstrated severe aortic stenosis.  His situation is complicated by the fact that he was recently diagnosed with a rectal mass and is scheduled for an MRI and oncology and surgical consultations.  Up until about 3 months ago the patient was swimming without any issues.  Since that time he has noticed increasing shortness of breath with swimming and with other exertion.  He fatigues relatively easily.  He fortunately has not developed any presyncope or syncope.  Recently he noted soreness when sitting down.  This ended up leading to a colonoscopy and the detection of a rectal mass.  He feels very fortunate and finding this.  In regards to other issues he lives by himself and does most of his activities of daily living.  He started recently using a rolling chair within the last few months because of his issues with fatigue and shortness of breath.  He continues to drive himself to the grocery store and to his appointments.  He sees a Education officer, community on a  regular basis and reports good dental health.  Past Medical History:  Diagnosis Date   A-fib [facility])    Allergic rhinitis    Arthritis    Asthma    seasonal per pt   Bilateral knee pain    OA   Diverticulosis 02/2012   seen on colonoscopy   Erectile dysfunction    GERD (gastroesophageal reflux disease)    Heart murmur    Hiatal hernia    HLD (hyperlipidemia)    HTN (hypertension)    Hydronephrosis determined by ultrasound 08/22/2015   Overactive bladder    Prediabetes    Pulmonary lesion    Ringing of ears    Sleep apnea    Urinary frequency    Urinary urgency    Wears glasses     Past Surgical History:  Procedure Laterality Date   CATARACT EXTRACTION W/PHACO Left 12/07/2021   Procedure: CATARACT EXTRACTION PHACO AND INTRAOCULAR LENS PLACEMENT (IOC);  Surgeon: Fabio Pierce, MD;  Location: [facility]S;  Service: Ophthalmology;  Laterality: Left;  CDE 7.75   CATARACT EXTRACTION W/PHACO Right 12/21/2021   Procedure: CATARACT EXTRACTION PHACO AND INTRAOCULAR LENS PLACEMENT (IOC);  Surgeon: Fabio Pierce, MD;  Location: [facility];  Service: Ophthalmology;  Laterality: Right;  CDE: 8.00   CHOLECYSTECTOMY     COLONOSCOPY  02/2012   Dr. Arlyce Dice: sigmoid, transverse, and ascending colon diverticulosis   COLONOSCOPY WITH PROPOFOL N/A 01/13/2023   Procedure: COLONOSCOPY WITH PROPOFOL;  Surgeon: Lanelle Bal, DO;  Location: [facility] SUITE;  Service: Endoscopy;  Laterality: N/A;  1:00 pm, asa 3   FINGER SURGERY Right  prostate procedure  2017   per patient, was having trouble urinating    TONSILLECTOMY     TOTAL KNEE ARTHROPLASTY  2012   left (Dr. 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/19/2015   Procedure: TOTAL KNEE ARTHROPLASTY;  Surgeon: Dannielle Huh, MD;  Location: [facility] OR;  Service: Orthopedics;  Laterality: Right;    Family History  Problem Relation Age of Onset   Uterine cancer Mother    Cancer Mother        uterine   Coronary artery disease Father    Coronary artery disease  Paternal Grandfather    Heart disease Paternal Grandfather    Colon cancer Neg Hx    Esophageal cancer Neg Hx    Stomach cancer Neg Hx    Rectal cancer Neg Hx    Liver disease Neg Hx     Social History   Socioeconomic History   Marital status: Divorced    Spouse name: Not on file   Number of children: 0   Years of education: Not on file   Highest education level: Not on file  Occupational History   Occupation: retired Lobbyist)    Employer: RETIRED  Tobacco Use   Smoking status: Former    Current packs/day: 0.00    Types: Cigarettes    Quit date: 01/18/2010    Years since quitting: 13.0   Smokeless tobacco: Never   Tobacco comments:    Smoked for 25 years- quit September 2011.  Passive exposure from wife  Vaping Use   Vaping status: Never Used  Substance and Sexual Activity   Alcohol use: Not Currently    Alcohol/week: 1.0 - 2.0 standard drink of alcohol    Types: 1 - 2 Glasses of wine per week   Drug use: No   Sexual activity: Not on file  Other Topics Concern   Not on file  Social History Narrative   Separated from wife (2014) and moved to [place]. Retired- worked in Production designer, theatre/television/film.    Social Determinants of Health   Financial Resource Strain: Not on file  Food Insecurity: No Food Insecurity (01/20/2023)   Hunger Vital Sign    Worried About Running Out of Food in the Last Year: Never true    Ran Out of Food in the Last Year: Never true  Transportation Needs: No Transportation Needs (01/20/2023)   PRAPARE - Administrator, Civil Service (Medical): No    Lack of Transportation (Non-Medical): No  Physical Activity: Not on file  Stress: Not on file  Social Connections: Not on file  Intimate Partner Violence: Not At Risk (01/20/2023)   Humiliation, Afraid, Rape, and Kick questionnaire    Fear of Current or Ex-Partner: No    Emotionally Abused: No    Physically Abused: No    Sexually Abused: No     Prior to Admission medications   Medication Sig  Start Date End Date Taking? Authorizing Provider  apixaban (ELIQUIS) 5 MG TABS tablet Take 1 tablet (5 mg total) by mouth 2 (two) times daily. 08/24/15   Philip Aspen, Limmie Patricia, MD  diltiazem (CARDIZEM CD) 120 MG 24 hr capsule Take 1 capsule (120 mg total) by mouth daily. 01/18/23   Dione Booze, MD  furosemide (LASIX) 40 MG tablet Take 1.5 tablets (60 mg total) by mouth daily. You can take an additional 40mg  of Lasix as needed for shortness of breath or swelling 01/24/23   Gaston Islam., NP  loratadine (CLARITIN) 10 MG tablet Take  10 mg by mouth daily as needed for allergies.    [provider]  losartan (COZAAR) 50 MG tablet Take 0.5 tablets (25 mg total) by mouth daily. Patient taking differently: Take 50 mg by mouth daily. 12/23/22   Vassie Loll, MD  magnesium chloride (SLOW-MAG) 64 MG TBEC SR tablet Take 1 tablet by mouth daily.    [provider]  Melatonin 5 MG TABS Take 10 mg by mouth at bedtime.     [provider]  metoprolol tartrate (LOPRESSOR) 50 MG tablet Take 1 tablet (50 mg total) by mouth 2 (two) times daily. 01/18/23   Dione Booze, MD  Multiple Vitamins-Minerals (MULTIVITAL) tablet Take 1 tablet by mouth daily.    [provider]  Omega-3 Fatty Acids (FISH OIL) 1000 MG CAPS Take 4 capsules by mouth daily.     [provider]  oxybutynin (DITROPAN) 5 MG tablet Take 5 mg by mouth every evening. 06/01/19   [provider]  OXYGEN Inhale 2 mLs into the lungs daily at 6 (six) AM.    [provider]  pantoprazole (PROTONIX) 40 MG tablet Take 40 mg by mouth daily.    [provider]  potassium chloride (K-DUR) 10 MEQ tablet Take 1 tablet (10 mEq total) by mouth daily. 10/09/17   Erick Blinks, MD  rosuvastatin (CRESTOR) 20 MG tablet Take 20 mg by mouth daily.    [provider]  vitamin C (ASCORBIC ACID) 500 MG tablet Take 500 mg by mouth daily.    [provider]    Allergies  Allergen  Reactions   Diovan [Valsartan] Cough   Tagamet [Cimetidine] Other (See Comments)    irrittability    REVIEW OF SYSTEMS:  General: no fevers/chills/night sweats Eyes: no blurry vision, diplopia, or amaurosis ENT: no sore throat or hearing loss Resp: no cough, wheezing, or hemoptysis CV: no edema or palpitations GI: no abdominal pain, nausea, vomiting, diarrhea, or constipation GU: no dysuria, frequency, or hematuria Skin: no rash Neuro: no headache, numbness, tingling, or weakness of extremities Musculoskeletal: no joint pain or swelling Heme: no bleeding, DVT, or easy bruising Endo: no polydipsia or polyuria  BP 130/70   Pulse 68   Ht 5\' 10"  (1.778 m)   Wt 266 lb (120.7 kg)   SpO2 98%   BMI 38.17 kg/m   PHYSICAL EXAM: GEN:  AO x 3 in no acute distress HEENT: normal Dentition: Normal Neck: JVP normal. +2 carotid upstrokes without bruits. No thyromegaly. Lungs: equal expansion, clear bilaterally CV: Apex is discrete and nondisplaced, rate and rhythm with 3 out of 6 systolic murmur best heard at the right subclavicular area Abd: soft, non-tender, non-distended; no bruit; positive bowel sounds Ext: no edema, ecchymoses, or cyanosis Vascular: 2+ femoral pulses, 2+ radial pulses       Skin: warm and dry without rash Neuro: CN II-XII grossly intact; motor and sensory grossly intact    DATA AND STUDIES:  EKG:     EKG performed 10/16/24demonstrates sinus rhythm and anteroseptal infarction pattern  Cardiac Studies & Procedures       ECHOCARDIOGRAM  ECHOCARDIOGRAM COMPLETE 12/21/2022  Narrative ECHOCARDIOGRAM REPORT    Patient Name:   Gary Carroll Date of Exam: 12/21/2022 Medical Rec #:  [id number]          Height:       69.0 in Accession #:    [id number]         Weight:  265.0 lb Date of Birth:  12/04/1945         BSA:          2.328 m Patient Age:    54 years           BP:           145/72 mmHg Patient Gender: M                  HR:           83  bpm. [name]  Procedure: 2D Echo, Cardiac Doppler and Color Doppler  Indications:    Congestive Heart Failure I50.9  History:        Patient has prior history of Echocardiogram examinations, most recent 02/04/2021. COPD, Arrythmias:Atrial Fibrillation; Risk Factors:Hypertension, Dyslipidemia and Current Smoker. Acute respiratory failure with hypoxia (HCC), Morbid Obesity.  Sonographer:    Celesta Gentile RCS Referring Phys: [id number] ASIA B ZIERLE-GHOSH  IMPRESSIONS   1. Left ventricular ejection fraction, by estimation, is 60 to 65%. The left ventricle has normal function. The left ventricle has no regional wall motion abnormalities. There is moderate left ventricular hypertrophy. Left ventricular diastolic parameters are indeterminate. Elevated left ventricular end-diastolic pressure. 2. Right ventricular systolic function is normal. The right ventricular size is normal. Tricuspid regurgitation signal is inadequate for assessing PA pressure. 3. Left atrial size was moderately dilated. 4. The mitral valve is abnormal. No evidence of mitral valve regurgitation. No evidence of mitral stenosis. Moderate to severe mitral annular calcification. 5. The aortic valve is tricuspid. There is severe calcifcation of the aortic valve. Aortic valve regurgitation is mild. Severe aortic valve stenosis. Aortic valve area, by VTI measures 0.93 cm. Aortic valve mean gradient measures 41.0 mmHg. Aortic valve Vmax measures 4.19 m/s. DVI is 0.26. 6. The inferior vena cava is normal in size with <50% respiratory variability, suggesting right atrial pressure of 8 mmHg. 7. Increased flow velocities may be secondary to anemia, thyrotoxicosis, hyperdynamic or high flow state.  Comparison(s): Changes from prior study are noted. AS progressed from moderate in 2022 to severe now.  FINDINGS Left Ventricle: Left ventricular ejection fraction, by estimation, is 60 to 65%. The left ventricle has normal  function. The left ventricle has no regional wall motion abnormalities. Definity contrast agent was given IV to delineate the left ventricular endocardial borders. The left ventricular internal cavity size was normal in size. There is moderate left ventricular hypertrophy. Left ventricular diastolic parameters are indeterminate. Elevated left ventricular end-diastolic pressure.  Right Ventricle: The right ventricular size is normal. No increase in right ventricular wall thickness. Right ventricular systolic function is normal. Tricuspid regurgitation signal is inadequate for assessing PA pressure.  Left Atrium: Left atrial size was moderately dilated.  Right Atrium: Right atrial size was normal in size.  Pericardium: There is no evidence of pericardial effusion.  Mitral Valve: The mitral valve is abnormal. Moderate to severe mitral annular calcification. No evidence of mitral valve regurgitation. No evidence of mitral valve stenosis.  Tricuspid Valve: The tricuspid valve is normal in structure. Tricuspid valve regurgitation is not demonstrated. No evidence of tricuspid stenosis.  Aortic Valve: The aortic valve is tricuspid. There is severe calcifcation of the aortic valve. Aortic valve regurgitation is mild. Aortic regurgitation PHT measures 351 msec. Severe aortic stenosis is present. Aortic valve mean gradient measures 41.0 mmHg. Aortic valve peak gradient measures 70.2 mmHg. Aortic valve area, by VTI measures 0.93 cm.  Pulmonic Valve: The pulmonic valve was normal  in structure. Pulmonic valve regurgitation is not visualized. No evidence of pulmonic stenosis.  Aorta: The aortic root is normal in size and structure.  Venous: The inferior vena cava is normal in size with less than 50% respiratory variability, suggesting right atrial pressure of 8 mmHg.  IAS/Shunts: No atrial level shunt detected by color flow Doppler.   LEFT VENTRICLE PLAX 2D LVIDd:         5.10 cm   Diastology LVIDs:          2.70 cm   LV e' medial:    5.66 cm/s LV PW:         1.40 cm   LV E/e' medial:  20.1 LV IVS:        1.40 cm   LV e' lateral:   7.51 cm/s LVOT diam:     1.90 cm   LV E/e' lateral: 15.2 LV SV:         92 LV SV Index:   40 LVOT Area:     2.84 cm   RIGHT VENTRICLE RV S prime:     16.50 cm/s TAPSE (M-mode): 3.1 cm  LEFT ATRIUM              Index        RIGHT ATRIUM           Index LA diam:        4.50 cm  1.93 cm/m   RA Area:     18.20 cm LA Vol (A2C):   72.0 ml  30.92 ml/m  RA Volume:   53.20 ml  22.85 ml/m LA Vol (A4C):   107.0 ml 45.95 ml/m LA Biplane Vol: 96.2 ml  41.31 ml/m AORTIC VALVE AV Area (Vmax):    0.72 cm AV Area (Vmean):   0.78 cm AV Area (VTI):     0.93 cm AV Vmax:           419.00 cm/s AV Vmean:          281.250 cm/s AV VTI:            0.993 m AV Peak Grad:      70.2 mmHg AV Mean Grad:      41.0 mmHg LVOT Vmax:         107.00 cm/s LVOT Vmean:        77.800 cm/s LVOT VTI:          0.325 m LVOT/AV VTI ratio: 0.33 AI PHT:            351 msec  AORTA Ao Root diam: 3.20 cm  MITRAL VALVE MV Area (PHT): 3.37 cm     SHUNTS MV Decel Time: 225 msec     Systemic VTI:  0.32 m MV E velocity: 114.00 cm/s  Systemic Diam: 1.90 cm MV A velocity: 94.60 cm/s MV E/A ratio:  1.21  Vishnu Priya Mallipeddi Electronically signed by Winfield Rast Mallipeddi Signature Date/Time: 12/21/2022/12:56:34 PM    Final             12/21/2022: TSH 2.105 01/20/2023: ALT 31 01/21/2023: B Natriuretic Peptide 470.0; BUN 18; Creatinine, Ser 0.80; Hemoglobin 10.3; Magnesium 1.9; Platelets 169; Potassium 3.7; Sodium 138   STS RISK CALCULATOR: Pending  NHYA CLASS: 2    ASSESSMENT AND PLAN:   Nonrheumatic aortic valve stenosis  Chronic heart failure with preserved ejection fraction (HCC)  AF (paroxysmal atrial fibrillation) (HCC)  Secondary hypercoagulable state (HCC)  Dyslipidemia  BMI 37.0-37.9, adult  Rectal mass  Type 2  diabetes mellitus without complication,  without long-term current use of insulin (HCC)  Hypertension associated with diabetes (HCC)  Hyperlipidemia associated with type 2 diabetes mellitus (HCC)  The patient has developed severe symptomatic aortic stenosis (stage D).  This is complicated by the recent detection of a rectal mass.  The patient has an evaluation with an MRI and meetings with both surgery and oncology in the near future.  He believes that he will be referred for radiation and chemotherapy with debulking surgery somewhat later.  Certainly understanding his oncologic prognosis will be important to inform our therapy for his aortic stenosis.  If he has greater than 1 year prognosis then I think we should proceed with an aortic valve intervention which would make any future surgery safer.  I will refer him for coronary angiography and right heart catheterization study, CT scan, and surgical opinion.  I have personally reviewed the patients imaging data as summarized above.  I have reviewed the natural history of aortic stenosis with the patient and family members who are present today. We have discussed the limitations of medical therapy and the poor prognosis associated with symptomatic aortic stenosis. We have also reviewed potential treatment options, including palliative medical therapy, conventional surgical aortic valve replacement, and transcatheter aortic valve replacement. We discussed treatment options in the context of this patient's specific comorbid medical conditions.   All of the patient's questions were answered today. Will make further recommendations based on the results of studies outlined above.   Total time spent with patient today 60 minutes. This includes reviewing records, evaluating the patient and coordinating care.   [name], MD  01/30/2023 6:42 AM    [facility] [address] [place], [place], [place]  [address] Phone: [phone]; Fax: [phone]

## 2023-01-28 NOTE — Progress Notes (Unsigned)
Patient ID: Gary Carroll MRN: [id number] DOB/AGE: [birth year] 77 y.o.  Primary Care Physician:Hall, Kathleene Hazel, MD Primary Cardiologist:  [facility]  FOCUSED CARDIOVASCULAR PROBLEM LIST:   Severe aortic stenosis with aortic valve area 0.93 cm2, mean gradient of 41 mmHg, and peak velocity of 4.2 m/s with ejection fraction of 60 to 65% TTE  August 2024; EKG demonstrates sinus rhythm without bundle-branch blocks Paroxysmal atrial fibrillation on Eliquis Obstructive sleep apnea on CPAP Hyperlipidemia Hypertension BMI 37 Newly diagnosed rectal mass undergoing evaluation T2DM, diet controlled  HISTORY OF PRESENT ILLNESS: The patient is a 77 y.o. male with the indicated medical history here for for recommendations regarding shortness of breath and recently diagnosed severe aortic stenosis.  The patient was seen in our office last week with complaints of increasing shortness of breath.  He had an echocardiogram done by his [place] cardiologist which demonstrated severe aortic stenosis.  His situation is complicated by the fact that he was recently diagnosed with a rectal mass and is scheduled for an MRI and oncology and surgical consultations.  Up until about 3 months ago the patient was swimming without any issues.  Since that time he has noticed increasing shortness of breath with swimming and with other exertion.  He fatigues relatively easily.  He fortunately has not developed any presyncope or syncope.  Recently he noted soreness when sitting down.  This ended up leading to a colonoscopy and the detection of a rectal mass.  He feels very fortunate and finding this.  In regards to other issues he lives by himself and does most of his activities of daily living.  He started recently using a rolling chair within the last few months because of his issues with fatigue and shortness of breath.  He continues to drive himself to the grocery store and to his appointments.  He sees a Education officer, community on a  regular basis and reports good dental health.  Past Medical History:  Diagnosis Date   A-fib [facility])    Allergic rhinitis    Arthritis    Asthma    seasonal per pt   Bilateral knee pain    OA   Diverticulosis 02/2012   seen on colonoscopy   Erectile dysfunction    GERD (gastroesophageal reflux disease)    Heart murmur    Hiatal hernia    HLD (hyperlipidemia)    HTN (hypertension)    Hydronephrosis determined by ultrasound 08/22/2015   Overactive bladder    Prediabetes    Pulmonary lesion    Ringing of ears    Sleep apnea    Urinary frequency    Urinary urgency    Wears glasses     Past Surgical History:  Procedure Laterality Date   CATARACT EXTRACTION W/PHACO Left 12/07/2021   Procedure: CATARACT EXTRACTION PHACO AND INTRAOCULAR LENS PLACEMENT (IOC);  Surgeon: Fabio Pierce, MD;  Location: [facility] ORS;  Service: Ophthalmology;  Laterality: Left;  CDE 7.75   CATARACT EXTRACTION W/PHACO Right 12/21/2021   Procedure: CATARACT EXTRACTION PHACO AND INTRAOCULAR LENS PLACEMENT (IOC);  Surgeon: Fabio Pierce, MD;  Location: [facility] ORS;  Service: Ophthalmology;  Laterality: Right;  CDE: 8.00   CHOLECYSTECTOMY     COLONOSCOPY  02/2012   Dr. Arlyce Dice: sigmoid, transverse, and ascending colon diverticulosis   COLONOSCOPY WITH PROPOFOL N/A 01/13/2023   Procedure: COLONOSCOPY WITH PROPOFOL;  Surgeon: Lanelle Bal, DO;  Location: [facility] ENDO SUITE;  Service: Endoscopy;  Laterality: N/A;  1:00 pm, asa 3   FINGER SURGERY Right  prostate procedure  2017   per patient, was having trouble urinating    TONSILLECTOMY     TOTAL KNEE ARTHROPLASTY  2012   left (Dr. 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/19/2015   Procedure: TOTAL KNEE ARTHROPLASTY;  Surgeon: Dannielle Huh, MD;  Location: [facility] OR;  Service: Orthopedics;  Laterality: Right;    Family History  Problem Relation Age of Onset   Uterine cancer Mother    Cancer Mother        uterine   Coronary artery disease Father    Coronary artery disease  Paternal Grandfather    Heart disease Paternal Grandfather    Colon cancer Neg Hx    Esophageal cancer Neg Hx    Stomach cancer Neg Hx    Rectal cancer Neg Hx    Liver disease Neg Hx     Social History   Socioeconomic History   Marital status: Divorced    Spouse name: Not on file   Number of children: 0   Years of education: Not on file   Highest education level: Not on file  Occupational History   Occupation: retired Lobbyist)    Employer: RETIRED  Tobacco Use   Smoking status: Former    Current packs/day: 0.00    Types: Cigarettes    Quit date: 01/18/2010    Years since quitting: 13.0   Smokeless tobacco: Never   Tobacco comments:    Smoked for 25 years- quit September 2011.  Passive exposure from wife  Vaping Use   Vaping status: Never Used  Substance and Sexual Activity   Alcohol use: Not Currently    Alcohol/week: 1.0 - 2.0 standard drink of alcohol    Types: 1 - 2 Glasses of wine per week   Drug use: No   Sexual activity: Not on file  Other Topics Concern   Not on file  Social History Narrative   Separated from wife (2014) and moved to [place]. Retired- worked in Production designer, theatre/television/film.    Social Determinants of Health   Financial Resource Strain: Not on file  Food Insecurity: No Food Insecurity (01/20/2023)   Hunger Vital Sign    Worried About Running Out of Food in the Last Year: Never true    Ran Out of Food in the Last Year: Never true  Transportation Needs: No Transportation Needs (01/20/2023)   PRAPARE - Administrator, Civil Service (Medical): No    Lack of Transportation (Non-Medical): No  Physical Activity: Not on file  Stress: Not on file  Social Connections: Not on file  Intimate Partner Violence: Not At Risk (01/20/2023)   Humiliation, Afraid, Rape, and Kick questionnaire    Fear of Current or Ex-Partner: No    Emotionally Abused: No    Physically Abused: No    Sexually Abused: No     Prior to Admission medications   Medication Sig  Start Date End Date Taking? Authorizing Provider  apixaban (ELIQUIS) 5 MG TABS tablet Take 1 tablet (5 mg total) by mouth 2 (two) times daily. 08/24/15   Philip Aspen, Limmie Patricia, MD  diltiazem (CARDIZEM CD) 120 MG 24 hr capsule Take 1 capsule (120 mg total) by mouth daily. 01/18/23   Dione Booze, MD  furosemide (LASIX) 40 MG tablet Take 1.5 tablets (60 mg total) by mouth daily. You can take an additional 40mg  of Lasix as needed for shortness of breath or swelling 01/24/23   Gaston Islam., NP  loratadine (CLARITIN) 10 MG tablet Take  10 mg by mouth daily as needed for allergies.    [provider]  losartan (COZAAR) 50 MG tablet Take 0.5 tablets (25 mg total) by mouth daily. Patient taking differently: Take 50 mg by mouth daily. 12/23/22   Vassie Loll, MD  magnesium chloride (SLOW-MAG) 64 MG TBEC SR tablet Take 1 tablet by mouth daily.    [provider]  Melatonin 5 MG TABS Take 10 mg by mouth at bedtime.     [provider]  metoprolol tartrate (LOPRESSOR) 50 MG tablet Take 1 tablet (50 mg total) by mouth 2 (two) times daily. 01/18/23   Dione Booze, MD  Multiple Vitamins-Minerals (MULTIVITAL) tablet Take 1 tablet by mouth daily.    [provider]  Omega-3 Fatty Acids (FISH OIL) 1000 MG CAPS Take 4 capsules by mouth daily.     [provider]  oxybutynin (DITROPAN) 5 MG tablet Take 5 mg by mouth every evening. 06/01/19   [provider]  OXYGEN Inhale 2 mLs into the lungs daily at 6 (six) AM.    [provider]  pantoprazole (PROTONIX) 40 MG tablet Take 40 mg by mouth daily.    [provider]  potassium chloride (K-DUR) 10 MEQ tablet Take 1 tablet (10 mEq total) by mouth daily. 10/09/17   Erick Blinks, MD  rosuvastatin (CRESTOR) 20 MG tablet Take 20 mg by mouth daily.    [provider]  vitamin C (ASCORBIC ACID) 500 MG tablet Take 500 mg by mouth daily.    [provider]    Allergies  Allergen  Reactions   Diovan [Valsartan] Cough   Tagamet [Cimetidine] Other (See Comments)    irrittability    REVIEW OF SYSTEMS:  General: no fevers/chills/night sweats Eyes: no blurry vision, diplopia, or amaurosis ENT: no sore throat or hearing loss Resp: no cough, wheezing, or hemoptysis CV: no edema or palpitations GI: no abdominal pain, nausea, vomiting, diarrhea, or constipation GU: no dysuria, frequency, or hematuria Skin: no rash Neuro: no headache, numbness, tingling, or weakness of extremities Musculoskeletal: no joint pain or swelling Heme: no bleeding, DVT, or easy bruising Endo: no polydipsia or polyuria  BP 130/70   Pulse 68   Ht 5\' 10"  (1.778 m)   Wt 266 lb (120.7 kg)   SpO2 98%   BMI 38.17 kg/m   PHYSICAL EXAM: GEN:  AO x 3 in no acute distress HEENT: normal Dentition: Normal Neck: JVP normal. +2 carotid upstrokes without bruits. No thyromegaly. Lungs: equal expansion, clear bilaterally CV: Apex is discrete and nondisplaced, rate and rhythm with 3 out of 6 systolic murmur best heard at the right subclavicular area Abd: soft, non-tender, non-distended; no bruit; positive bowel sounds Ext: no edema, ecchymoses, or cyanosis Vascular: 2+ femoral pulses, 2+ radial pulses       Skin: warm and dry without rash Neuro: CN II-XII grossly intact; motor and sensory grossly intact    DATA AND STUDIES:  EKG:     EKG performed October 03, 2024demonstrates sinus rhythm and anteroseptal infarction pattern  Cardiac Studies & Procedures       ECHOCARDIOGRAM  ECHOCARDIOGRAM COMPLETE 12/21/2022  Narrative ECHOCARDIOGRAM REPORT    Patient Name:   LORENTZ TAINTER Date of Exam: 12/21/2022 Medical Rec #:  [id number]          Height:       69.0 in Accession #:    [id number]         Weight:  265.0 lb Date of Birth:  1946-02-13         BSA:          2.328 m Patient Age:    69 years           BP:           145/72 mmHg Patient Gender: M                  HR:           83  bpm. [name]  Procedure: 2D Echo, Cardiac Doppler and Color Doppler  Indications:    Congestive Heart Failure I50.9  History:        Patient has prior history of Echocardiogram examinations, most recent 02/04/2021. COPD, Arrythmias:Atrial Fibrillation; Risk Factors:Hypertension, Dyslipidemia and Current Smoker. Acute respiratory failure with hypoxia (HCC), Morbid Obesity.  Sonographer:    Celesta Gentile RCS Referring Phys: [id number] ASIA B ZIERLE-GHOSH  IMPRESSIONS   1. Left ventricular ejection fraction, by estimation, is 60 to 65%. The left ventricle has normal function. The left ventricle has no regional wall motion abnormalities. There is moderate left ventricular hypertrophy. Left ventricular diastolic parameters are indeterminate. Elevated left ventricular end-diastolic pressure. 2. Right ventricular systolic function is normal. The right ventricular size is normal. Tricuspid regurgitation signal is inadequate for assessing PA pressure. 3. Left atrial size was moderately dilated. 4. The mitral valve is abnormal. No evidence of mitral valve regurgitation. No evidence of mitral stenosis. Moderate to severe mitral annular calcification. 5. The aortic valve is tricuspid. There is severe calcifcation of the aortic valve. Aortic valve regurgitation is mild. Severe aortic valve stenosis. Aortic valve area, by VTI measures 0.93 cm. Aortic valve mean gradient measures 41.0 mmHg. Aortic valve Vmax measures 4.19 m/s. DVI is 0.26. 6. The inferior vena cava is normal in size with <50% respiratory variability, suggesting right atrial pressure of 8 mmHg. 7. Increased flow velocities may be secondary to anemia, thyrotoxicosis, hyperdynamic or high flow state.  Comparison(s): Changes from prior study are noted. AS progressed from moderate in 2022 to severe now.  FINDINGS Left Ventricle: Left ventricular ejection fraction, by estimation, is 60 to 65%. The left ventricle has normal  function. The left ventricle has no regional wall motion abnormalities. Definity contrast agent was given IV to delineate the left ventricular endocardial borders. The left ventricular internal cavity size was normal in size. There is moderate left ventricular hypertrophy. Left ventricular diastolic parameters are indeterminate. Elevated left ventricular end-diastolic pressure.  Right Ventricle: The right ventricular size is normal. No increase in right ventricular wall thickness. Right ventricular systolic function is normal. Tricuspid regurgitation signal is inadequate for assessing PA pressure.  Left Atrium: Left atrial size was moderately dilated.  Right Atrium: Right atrial size was normal in size.  Pericardium: There is no evidence of pericardial effusion.  Mitral Valve: The mitral valve is abnormal. Moderate to severe mitral annular calcification. No evidence of mitral valve regurgitation. No evidence of mitral valve stenosis.  Tricuspid Valve: The tricuspid valve is normal in structure. Tricuspid valve regurgitation is not demonstrated. No evidence of tricuspid stenosis.  Aortic Valve: The aortic valve is tricuspid. There is severe calcifcation of the aortic valve. Aortic valve regurgitation is mild. Aortic regurgitation PHT measures 351 msec. Severe aortic stenosis is present. Aortic valve mean gradient measures 41.0 mmHg. Aortic valve peak gradient measures 70.2 mmHg. Aortic valve area, by VTI measures 0.93 cm.  Pulmonic Valve: The pulmonic valve was normal  in structure. Pulmonic valve regurgitation is not visualized. No evidence of pulmonic stenosis.  Aorta: The aortic root is normal in size and structure.  Venous: The inferior vena cava is normal in size with less than 50% respiratory variability, suggesting right atrial pressure of 8 mmHg.  IAS/Shunts: No atrial level shunt detected by color flow Doppler.   LEFT VENTRICLE PLAX 2D LVIDd:         5.10 cm   Diastology LVIDs:          2.70 cm   LV e' medial:    5.66 cm/s LV PW:         1.40 cm   LV E/e' medial:  20.1 LV IVS:        1.40 cm   LV e' lateral:   7.51 cm/s LVOT diam:     1.90 cm   LV E/e' lateral: 15.2 LV SV:         92 LV SV Index:   40 LVOT Area:     2.84 cm   RIGHT VENTRICLE RV S prime:     16.50 cm/s TAPSE (M-mode): 3.1 cm  LEFT ATRIUM              Index        RIGHT ATRIUM           Index LA diam:        4.50 cm  1.93 cm/m   RA Area:     18.20 cm LA Vol (A2C):   72.0 ml  30.92 ml/m  RA Volume:   53.20 ml  22.85 ml/m LA Vol (A4C):   107.0 ml 45.95 ml/m LA Biplane Vol: 96.2 ml  41.31 ml/m AORTIC VALVE AV Area (Vmax):    0.72 cm AV Area (Vmean):   0.78 cm AV Area (VTI):     0.93 cm AV Vmax:           419.00 cm/s AV Vmean:          281.250 cm/s AV VTI:            0.993 m AV Peak Grad:      70.2 mmHg AV Mean Grad:      41.0 mmHg LVOT Vmax:         107.00 cm/s LVOT Vmean:        77.800 cm/s LVOT VTI:          0.325 m LVOT/AV VTI ratio: 0.33 AI PHT:            351 msec  AORTA Ao Root diam: 3.20 cm  MITRAL VALVE MV Area (PHT): 3.37 cm     SHUNTS MV Decel Time: 225 msec     Systemic VTI:  0.32 m MV E velocity: 114.00 cm/s  Systemic Diam: 1.90 cm MV A velocity: 94.60 cm/s MV E/A ratio:  1.21  Vishnu Priya Mallipeddi Electronically signed by Winfield Rast Mallipeddi Signature Date/Time: 12/21/2022/12:56:34 PM    Final             12/21/2022: TSH 2.105 01/20/2023: ALT 31 01/21/2023: B Natriuretic Peptide 470.0; BUN 18; Creatinine, Ser 0.80; Hemoglobin 10.3; Magnesium 1.9; Platelets 169; Potassium 3.7; Sodium 138   STS RISK CALCULATOR: Pending  NHYA CLASS: 2    ASSESSMENT AND PLAN:   Nonrheumatic aortic valve stenosis  Chronic heart failure with preserved ejection fraction (HCC)  AF (paroxysmal atrial fibrillation) (HCC)  Secondary hypercoagulable state (HCC)  Dyslipidemia  BMI 37.0-37.9, adult  Rectal mass  Type 2  diabetes mellitus without complication,  without long-term current use of insulin (HCC)  Hypertension associated with diabetes (HCC)  Hyperlipidemia associated with type 2 diabetes mellitus (HCC)  The patient has developed severe symptomatic aortic stenosis (stage D).  This is complicated by the recent detection of a rectal mass.  The patient has an evaluation with an MRI and meetings with both surgery and oncology in the near future.  He believes that he will be referred for radiation and chemotherapy with debulking surgery somewhat later.  Certainly understanding his oncologic prognosis will be important to inform our therapy for his aortic stenosis.  If he has greater than 1 year prognosis then I think we should proceed with an aortic valve intervention which would make any future surgery safer.  I will refer him for coronary angiography and right heart catheterization study, CT scan, and surgical opinion.  I have personally reviewed the patients imaging data as summarized above.  I have reviewed the natural history of aortic stenosis with the patient and family members who are present today. We have discussed the limitations of medical therapy and the poor prognosis associated with symptomatic aortic stenosis. We have also reviewed potential treatment options, including palliative medical therapy, conventional surgical aortic valve replacement, and transcatheter aortic valve replacement. We discussed treatment options in the context of this patient's specific comorbid medical conditions.   All of the patient's questions were answered today. Will make further recommendations based on the results of studies outlined above.   Total time spent with patient today 60 minutes. This includes reviewing records, evaluating the patient and coordinating care.   [name], MD  01/30/2023 6:42 AM    [facility] [address] [place], [place], [place]  [address] Phone: [phone]; Fax: [phone]

## 2023-01-28 NOTE — Patient Instructions (Signed)
We reviewed your CT scans of the chest done in August 2024.  These are overall stable compared with your priors You will need a repeat CT scan of your chest done in 6 months.  This can be done by oncology if they have plans for additional scanning Following with oncology as planned.  Follow with cardiology as planned.  Continue your cardiac medications including your diuretics per their directions. We will hold off on starting any inhaled medications for now We will perform a walking oximetry on room air today.  If you do not have any desaturations with ambulation then we will send an order to discontinue your home oxygen. Follow with Dr Delton Coombes in 6 months or sooner if you have any problems

## 2023-01-28 NOTE — Addendum Note (Signed)
Addended by: Carnella Guadalajara on: 01/28/2023 04:31 PM   Modules accepted: Orders

## 2023-01-28 NOTE — Assessment & Plan Note (Signed)
Presumed COPD given his tobacco history and prior PFT.  He has never responded to scheduled BD therapy.  There still may be room to start this at some point going forward depending on how symptoms evolve.  We will hold off for now.

## 2023-01-28 NOTE — Progress Notes (Signed)
Subjective:    Patient ID: Gary Carroll, male    DOB: 1946-03-27, 77 y.o.   MRN: [id number]  HPI  ROV 01/28/23 -- 77 yo man w hx tobacco, mixed obstruction / restriction consistent w COPD and obesity, OSA on CPAP, HTN, A Fib, aortic stenosis, diastolic dysfxn. I last saw him 08/2022 for COPD and waxing/waning pulmonary nodules on CT chest, ? Inflammatory. Since I have seen him he was dx at the [place] with rectal CA.  He was admitted 9/2-01/21/2023 for volume overload in the setting of rapid atrial fibrillation.  He transiently required BiPAP, was diuresed effectively.  He was discharged home on 2 L/min.  He is not on any scheduled bronchodilator therapy currently, unsure whether he benefited from Spiriva in the past. He is discussing possible colon surgery, not yet scheduled. Staging testing is underway.  He reports. His saturations are 97% today, he checks his saturations at home and has very infrequently seen desaturations. He is not using the oxygen that he was given reliably - hasn't needed to based on sats. Paces himself, some exertional SOB. He is tired and feels weaker.   CT-PA 01/17/2023 [facility]) reviewed by me shows hilar and mediastinal adenopathy with nodes that are decreased in size compared with 12/21/2022.  There is a left apical opacity stable back to March 2024, and some adjacent pulmonary nodules up to 9 mm that have been stable for longer and decreased in size when compared with priors 2022.Marland Kitchen   Review of Systems As per HPI     Objective:   Physical Exam  Vitals:   01/28/23 1118  BP: 130/70  Pulse: 67  SpO2: 97%  Weight: 263 lb 9.6 oz (119.6 kg)  Height: 5\' 11"  (1.803 m)    Gen: Pleasant, obese man, in no distress,  normal affect  ENT: No lesions,  mouth clear,  oropharynx clear, no postnasal drip  Neck: No JVD, no stridor  Lungs: No use of accessory muscles, no crackles or wheezing on normal respiration, no wheeze on forced expiration  Cardiovascular: RRR,  harsh holosystolic murmur with intact S2.  2+ bilateral lower extremity edema with compression hose in place  Musculoskeletal: No deformities, no cyanosis or clubbing  Neuro: alert, awake, non focal  Skin: Warm, no lesions or rash      Assessment & Plan:  Lung nodules Overall his lung nodules are stable.  The left upper lobe nodules have not changed, are smaller compared with more remote scans back to 2022.  He does have a stable left apical opacity, question scar.  He will need repeat imaging in about 6 months.  This may end up being done by oncology since he will be treated and followed for colon cancer.  Sleep apnea Continue CPAP  Acute respiratory failure with hypoxia (HCC) He was admitted with an episode of decompensation from his diastolic CHF and was sent home on oxygen 2 L/min.  He is unclear whether he still needs this.  We will perform a walking oximetry today on room air.  If he desaturates then we will give him instructions on when to use the oxygen.  If he does not desaturate then he would like for [place] to discontinue the oxygen through his DME.  Obstructive lung disease (generalized) (HCC) Presumed COPD given his tobacco history and prior PFT.  He has never responded to scheduled BD therapy.  There still may be room to start this at some point going forward depending on how symptoms evolve.  We will hold off for now.   Levy Pupa, MD, PhD 01/28/2023, 11:37 AM [facility] [phone] or if no answer before 7:00PM call [phone] For any issues after 7:00PM please call eLink [phone]

## 2023-01-28 NOTE — Assessment & Plan Note (Signed)
Continue CPAP.  

## 2023-01-29 ENCOUNTER — Ambulatory Visit: Payer: No Typology Code available for payment source | Attending: Internal Medicine | Admitting: Internal Medicine

## 2023-01-29 ENCOUNTER — Encounter: Payer: Self-pay | Admitting: Internal Medicine

## 2023-01-29 ENCOUNTER — Other Ambulatory Visit (HOSPITAL_COMMUNITY): Payer: Self-pay

## 2023-01-29 VITALS — BP 130/70 | HR 68 | Ht 70.0 in | Wt 266.0 lb

## 2023-01-29 DIAGNOSIS — E785 Hyperlipidemia, unspecified: Secondary | ICD-10-CM

## 2023-01-29 DIAGNOSIS — I10 Essential (primary) hypertension: Secondary | ICD-10-CM

## 2023-01-29 DIAGNOSIS — I152 Hypertension secondary to endocrine disorders: Secondary | ICD-10-CM

## 2023-01-29 DIAGNOSIS — I350 Nonrheumatic aortic (valve) stenosis: Secondary | ICD-10-CM | POA: Diagnosis not present

## 2023-01-29 DIAGNOSIS — C20 Malignant neoplasm of rectum: Secondary | ICD-10-CM

## 2023-01-29 DIAGNOSIS — I480 Paroxysmal atrial fibrillation: Secondary | ICD-10-CM | POA: Diagnosis not present

## 2023-01-29 DIAGNOSIS — K6289 Other specified diseases of anus and rectum: Secondary | ICD-10-CM

## 2023-01-29 DIAGNOSIS — D6869 Other thrombophilia: Secondary | ICD-10-CM | POA: Diagnosis not present

## 2023-01-29 DIAGNOSIS — E119 Type 2 diabetes mellitus without complications: Secondary | ICD-10-CM

## 2023-01-29 DIAGNOSIS — E1169 Type 2 diabetes mellitus with other specified complication: Secondary | ICD-10-CM

## 2023-01-29 DIAGNOSIS — I5032 Chronic diastolic (congestive) heart failure: Secondary | ICD-10-CM

## 2023-01-29 DIAGNOSIS — Z6837 Body mass index (BMI) 37.0-37.9, adult: Secondary | ICD-10-CM

## 2023-01-29 DIAGNOSIS — E1159 Type 2 diabetes mellitus with other circulatory complications: Secondary | ICD-10-CM

## 2023-01-29 NOTE — Patient Instructions (Signed)
Medication Instructions:  No changes *If you need a refill on your cardiac medications before your next appointment, please call your pharmacy*   Lab Work: none   Testing/Procedures: Your physician has requested that you have a cardiac catheterization. Cardiac catheterization is used to diagnose and/or treat various heart conditions. Doctors may recommend this procedure for a number of different reasons. The most common reason is to evaluate chest pain. Chest pain can be a symptom of coronary artery disease (CAD), and cardiac catheterization can show whether plaque is narrowing or blocking your heart's arteries. This procedure is also used to evaluate the valves, as well as measure the blood flow and oxygen levels in different parts of your heart. For further information please visit [email or web address] Please follow instruction sheet, as given.   Follow-Up: Per Structural Heart Team        Cardiac/Peripheral Catheterization   You are scheduled for a Cardiac Catheterization on Wednesday, September 18 with Dr. Alverda Skeans.  1. Please arrive at the [facility] (Main Entrance A) at [facility]: [address] [place], [place] [address] at 11:00 AM (This time is TWO hour(s) before your procedure to ensure your preparation). Free valet parking service is available. You will check in at ADMITTING. The support person will be asked to wait in the waiting room.  It is OK to have someone drop you off and come back when you are ready to be discharged.        Special note: Every effort is made to have your procedure done on time. Please understand that emergencies sometimes delay scheduled procedures.  2. Diet: Do not eat solid foods after midnight.  You may have clear liquids until 5 AM the day of the procedure.  3. Labs: Completed 01/21/23  4. Medication instructions in preparation for your procedure:   Contrast Allergy: No  No Lasix (furosemide) day of procedure Hold Elquis for  2 days prior to procedure and day of procedure. (Take last dose in the evening of Sunday 9/15)   On the morning of your procedure, take Aspirin 81 mg and any morning medicines NOT listed above.  You may use sips of water.  5. Plan to go home the same day, you will only stay overnight if medically necessary. 6. You MUST have a responsible adult to drive you home. 7. An adult MUST be with you the first 24 hours after you arrive home. 8. Bring a current list of your medications, and the last time and date medication taken. 9. Bring ID and current insurance cards. 10.Please wear clothes that are easy to get on and off and wear slip-on shoes.  Thank you for allowing [place] to care for you!   -- [facility]

## 2023-01-29 NOTE — Progress Notes (Addendum)
Pre Surgical Assessment: 5 M Walk Test  36M=16.5ft  5 Meter Walk Test- trial 1: 10.08 seconds 5 Meter Walk Test- trial 2: 9.87 seconds 5 Meter Walk Test- trial 3: 9.79 seconds 5 Meter Walk Test Average: 9.91 seconds  _________________________  Procedure Type: Isolated AVR Perioperative Outcome Estimate % Operative Mortality 2.57% Morbidity & Mortality 10.5% Stroke 1.02% Renal Failure 2.57% Reoperation 3.63% Prolonged Ventilation 5.86% Deep Sternal Wound Infection 0.112% Long Hospital Stay (>14 days) 8.09% Short Hospital Stay (<6 days)* 32.5%

## 2023-01-31 ENCOUNTER — Ambulatory Visit (HOSPITAL_COMMUNITY): Payer: No Typology Code available for payment source

## 2023-02-01 ENCOUNTER — Ambulatory Visit (HOSPITAL_COMMUNITY)
Admission: RE | Admit: 2023-02-01 | Discharge: 2023-02-01 | Disposition: A | Discharge status: Home / Self Care | Payer: No Typology Code available for payment source | Source: Ambulatory Visit

## 2023-02-01 DIAGNOSIS — C20 Malignant neoplasm of rectum: Secondary | ICD-10-CM | POA: Insufficient documentation

## 2023-02-01 MED ORDER — GADOBUTROL 1 MMOL/ML IV SOLN
10.0000 mL | Freq: Once | INTRAVENOUS | Status: AC | PRN
  Administered 2023-02-01: 10 mL via INTRAVENOUS

## 2023-02-04 ENCOUNTER — Telehealth: Payer: Self-pay | Admitting: *Deleted

## 2023-02-04 NOTE — Telephone Encounter (Signed)
Cardiac Catheterization scheduled at [facility] for: Wednesday February 05, 2023 1 PM Arrival time [facility] Main Entrance A at: 11 AM  Nothing to eat after midnight prior to procedure, clear liquids until 5 AM day of procedure.  Medication instructions: -Hold:  Eliquis-pt reports none starting 02/03/23 and knows to hold until post procedure  Lasix/KCl-AM of procedure -Other usual morning medications can be taken with sips of water including aspirin 81 mg.  Patient reports he does have transportation to and from the hospital, but does not have responsible adult to be with him first 24 hours if same day discharge. Cath Lab aware plan for pt to stay overnight after cath.  Reviewed procedure instructions with patient.

## 2023-02-05 ENCOUNTER — Encounter (HOSPITAL_COMMUNITY)
Admission: RE | Disposition: A | Discharge status: Home / Self Care | Payer: Self-pay | Source: Home / Self Care | Attending: Internal Medicine

## 2023-02-05 ENCOUNTER — Other Ambulatory Visit: Payer: Self-pay

## 2023-02-05 ENCOUNTER — Observation Stay (HOSPITAL_COMMUNITY)
Admission: RE | Admit: 2023-02-05 | Discharge: 2023-02-06 | Disposition: A | Discharge status: Home / Self Care | Payer: No Typology Code available for payment source | Attending: Internal Medicine | Admitting: Internal Medicine

## 2023-02-05 DIAGNOSIS — Z79899 Other long term (current) drug therapy: Secondary | ICD-10-CM | POA: Diagnosis not present

## 2023-02-05 DIAGNOSIS — I5032 Chronic diastolic (congestive) heart failure: Secondary | ICD-10-CM | POA: Insufficient documentation

## 2023-02-05 DIAGNOSIS — Z96653 Presence of artificial knee joint, bilateral: Secondary | ICD-10-CM | POA: Insufficient documentation

## 2023-02-05 DIAGNOSIS — I110 Hypertensive heart disease with heart failure: Secondary | ICD-10-CM | POA: Diagnosis not present

## 2023-02-05 DIAGNOSIS — I480 Paroxysmal atrial fibrillation: Secondary | ICD-10-CM | POA: Insufficient documentation

## 2023-02-05 DIAGNOSIS — I5030 Unspecified diastolic (congestive) heart failure: Secondary | ICD-10-CM | POA: Insufficient documentation

## 2023-02-05 DIAGNOSIS — I350 Nonrheumatic aortic (valve) stenosis: Principal | ICD-10-CM | POA: Insufficient documentation

## 2023-02-05 DIAGNOSIS — I2510 Atherosclerotic heart disease of native coronary artery without angina pectoris: Secondary | ICD-10-CM | POA: Diagnosis not present

## 2023-02-05 DIAGNOSIS — J45909 Unspecified asthma, uncomplicated: Secondary | ICD-10-CM | POA: Insufficient documentation

## 2023-02-05 DIAGNOSIS — Z87891 Personal history of nicotine dependence: Secondary | ICD-10-CM | POA: Diagnosis not present

## 2023-02-05 HISTORY — DX: Atherosclerotic heart disease of native coronary artery without angina pectoris: I25.10

## 2023-02-05 HISTORY — PX: RIGHT HEART CATH AND CORONARY ANGIOGRAPHY: CATH118264

## 2023-02-05 HISTORY — DX: Nonrheumatic aortic (valve) stenosis: I35.0

## 2023-02-05 HISTORY — PX: ABDOMINAL AORTOGRAM W/LOWER EXTREMITY: CATH118223

## 2023-02-05 LAB — POCT I-STAT EG7
Acid-base deficit: 2 mmol/L (ref 0.0–2.0)
Acid-base deficit: 3 mmol/L — ABNORMAL HIGH (ref 0.0–2.0)
Bicarbonate: 23.7 mmol/L (ref 20.0–28.0)
Bicarbonate: 23.9 mmol/L (ref 20.0–28.0)
Calcium, Ion: 1.24 mmol/L (ref 1.15–1.40)
Calcium, Ion: 1.25 mmol/L (ref 1.15–1.40)
HCT: 31 % — ABNORMAL LOW (ref 39.0–52.0)
HCT: 31 % — ABNORMAL LOW (ref 39.0–52.0)
Hemoglobin: 10.5 g/dL — ABNORMAL LOW (ref 13.0–17.0)
Hemoglobin: 10.5 g/dL — ABNORMAL LOW (ref 13.0–17.0)
O2 Saturation: 50 %
O2 Saturation: 57 %
Potassium: 3.7 mmol/L (ref 3.5–5.1)
Potassium: 3.7 mmol/L (ref 3.5–5.1)
Sodium: 147 mmol/L — ABNORMAL HIGH (ref 135–145)
Sodium: 147 mmol/L — ABNORMAL HIGH (ref 135–145)
TCO2: 25 mmol/L (ref 22–32)
TCO2: 25 mmol/L (ref 22–32)
pCO2, Ven: 46.4 mmHg (ref 44–60)
pCO2, Ven: 47.3 mmHg (ref 44–60)
pH, Ven: 7.308 (ref 7.25–7.43)
pH, Ven: 7.319 (ref 7.25–7.43)
pO2, Ven: 29 mmHg — CL (ref 32–45)
pO2, Ven: 33 mmHg (ref 32–45)

## 2023-02-05 LAB — POCT I-STAT 7, (LYTES, BLD GAS, ICA,H+H)
Acid-base deficit: 4 mmol/L — ABNORMAL HIGH (ref 0.0–2.0)
Bicarbonate: 21.5 mmol/L (ref 20.0–28.0)
Calcium, Ion: 1.2 mmol/L (ref 1.15–1.40)
HCT: 31 % — ABNORMAL LOW (ref 39.0–52.0)
Hemoglobin: 10.5 g/dL — ABNORMAL LOW (ref 13.0–17.0)
O2 Saturation: 92 %
Potassium: 3.6 mmol/L (ref 3.5–5.1)
Sodium: 148 mmol/L — ABNORMAL HIGH (ref 135–145)
TCO2: 23 mmol/L (ref 22–32)
pCO2 arterial: 38.7 mmHg (ref 32–48)
pH, Arterial: 7.353 (ref 7.35–7.45)
pO2, Arterial: 66 mmHg — ABNORMAL LOW (ref 83–108)

## 2023-02-05 LAB — GLUCOSE, CAPILLARY: Glucose-Capillary: 118 mg/dL — ABNORMAL HIGH (ref 70–99)

## 2023-02-05 SURGERY — RIGHT HEART CATH AND CORONARY ANGIOGRAPHY
Anesthesia: LOCAL

## 2023-02-05 MED ORDER — LIDOCAINE HCL (PF) 1 % IJ SOLN
INTRAMUSCULAR | Status: AC
  Filled 2023-02-05: qty 30

## 2023-02-05 MED ORDER — FUROSEMIDE 40 MG PO TABS
40.0000 mg | ORAL_TABLET | Freq: Two times a day (BID) | ORAL | Status: DC
  Filled 2023-02-05: qty 1

## 2023-02-05 MED ORDER — LABETALOL HCL 5 MG/ML IV SOLN: 10.0000 mg | INTRAVENOUS | Status: AC | PRN

## 2023-02-05 MED ORDER — FENTANYL CITRATE (PF) 100 MCG/2ML IJ SOLN
INTRAMUSCULAR | Status: DC | PRN
  Administered 2023-02-05: 25 ug via INTRAVENOUS

## 2023-02-05 MED ORDER — ASPIRIN 81 MG PO CHEW: 81.0000 mg | CHEWABLE_TABLET | ORAL | Status: DC

## 2023-02-05 MED ORDER — FENTANYL CITRATE (PF) 100 MCG/2ML IJ SOLN
INTRAMUSCULAR | Status: AC
  Filled 2023-02-05: qty 2

## 2023-02-05 MED ORDER — HEPARIN SODIUM (PORCINE) 1000 UNIT/ML IJ SOLN
INTRAMUSCULAR | Status: AC
  Filled 2023-02-05: qty 10

## 2023-02-05 MED ORDER — ACETAMINOPHEN 325 MG PO TABS: 650.0000 mg | ORAL_TABLET | ORAL | Status: DC | PRN

## 2023-02-05 MED ORDER — LACTATED RINGERS IV SOLN: INTRAVENOUS | Status: DC

## 2023-02-05 MED ORDER — APIXABAN 5 MG PO TABS
5.0000 mg | ORAL_TABLET | Freq: Two times a day (BID) | ORAL | Status: DC
  Administered 2023-02-06: 5 mg via ORAL
  Filled 2023-02-05: qty 1

## 2023-02-05 MED ORDER — SODIUM CHLORIDE 0.9 % WEIGHT BASED INFUSION: 1.0000 mL/kg/h | INTRAVENOUS | Status: DC

## 2023-02-05 MED ORDER — VERAPAMIL HCL 2.5 MG/ML IV SOLN
INTRAVENOUS | Status: DC | PRN
  Administered 2023-02-05: 10 mL via INTRA_ARTERIAL

## 2023-02-05 MED ORDER — IOHEXOL 350 MG/ML SOLN
INTRAVENOUS | Status: DC | PRN
  Administered 2023-02-05: 70 mL

## 2023-02-05 MED ORDER — MIDAZOLAM HCL 2 MG/2ML IJ SOLN
INTRAMUSCULAR | Status: DC | PRN
  Administered 2023-02-05: 1 mg via INTRAVENOUS

## 2023-02-05 MED ORDER — SODIUM CHLORIDE 0.9% FLUSH: 3.0000 mL | INTRAVENOUS | Status: DC | PRN

## 2023-02-05 MED ORDER — SODIUM CHLORIDE 0.9 % IV SOLN: 250.0000 mL | INTRAVENOUS | Status: DC | PRN

## 2023-02-05 MED ORDER — FUROSEMIDE 10 MG/ML IJ SOLN
INTRAMUSCULAR | Status: AC
  Filled 2023-02-05: qty 4

## 2023-02-05 MED ORDER — HEPARIN SODIUM (PORCINE) 1000 UNIT/ML IJ SOLN
INTRAMUSCULAR | Status: DC | PRN
  Administered 2023-02-05: 5000 [IU] via INTRAVENOUS

## 2023-02-05 MED ORDER — SODIUM CHLORIDE 0.9% FLUSH
3.0000 mL | Freq: Two times a day (BID) | INTRAVENOUS | Status: DC
  Administered 2023-02-05 – 2023-02-06 (×2): 3 mL via INTRAVENOUS

## 2023-02-05 MED ORDER — SODIUM CHLORIDE 0.9 % WEIGHT BASED INFUSION
3.0000 mL/kg/h | INTRAVENOUS | Status: AC
  Administered 2023-02-05: 3 mL/kg/h via INTRAVENOUS

## 2023-02-05 MED ORDER — HEPARIN (PORCINE) IN NACL 1000-0.9 UT/500ML-% IV SOLN
INTRAVENOUS | Status: DC | PRN
  Administered 2023-02-05 (×2): 500 mL

## 2023-02-05 MED ORDER — MIDAZOLAM HCL 2 MG/2ML IJ SOLN
INTRAMUSCULAR | Status: AC
  Filled 2023-02-05: qty 2

## 2023-02-05 MED ORDER — HYDRALAZINE HCL 20 MG/ML IJ SOLN
10.0000 mg | INTRAMUSCULAR | Status: AC | PRN
  Filled 2023-02-05: qty 1

## 2023-02-05 MED ORDER — ONDANSETRON HCL 4 MG/2ML IJ SOLN: 4.0000 mg | Freq: Four times a day (QID) | INTRAMUSCULAR | Status: DC | PRN

## 2023-02-05 MED ORDER — LIDOCAINE HCL (PF) 1 % IJ SOLN
INTRAMUSCULAR | Status: DC | PRN
  Administered 2023-02-05: 2 mL

## 2023-02-05 MED ORDER — FUROSEMIDE 10 MG/ML IJ SOLN
40.0000 mg | Freq: Once | INTRAMUSCULAR | Status: AC
  Administered 2023-02-05: 40 mg via INTRAVENOUS

## 2023-02-05 MED ORDER — VERAPAMIL HCL 2.5 MG/ML IV SOLN
INTRAVENOUS | Status: AC
  Filled 2023-02-05: qty 2

## 2023-02-05 SURGICAL SUPPLY — 10 items
CATH BALLN WEDGE 5F 110CM (CATHETERS) ×1 IMPLANT
CATH DIAG 6FR PIGTAIL ANGLED (CATHETERS) ×1 IMPLANT
CATH INFINITI AMBI 6FR TG (CATHETERS) ×1 IMPLANT
DEVICE RAD COMP TR BAND LRG (VASCULAR PRODUCTS) ×1 IMPLANT
GLIDESHEATH SLEND SS 6F .021 (SHEATH) ×1 IMPLANT
GUIDEWIRE .025 260CM (WIRE) ×1 IMPLANT
PACK CARDIAC CATHETERIZATION (CUSTOM PROCEDURE TRAY) ×2 IMPLANT
SET ATX-X65L (MISCELLANEOUS) ×1 IMPLANT
SHEATH GLIDE SLENDER 4/5FR (SHEATH) ×1 IMPLANT
WIRE EMERALD 3MM-J .035X260CM (WIRE) ×1 IMPLANT

## 2023-02-05 NOTE — Interval H&P Note (Signed)
History and Physical Interval Note:  02/05/2023 11:21 AM  Gary Carroll  has presented today for surgery, with the diagnosis of aortic stenosis.  The various methods of treatment have been discussed with the patient and family. After consideration of risks, benefits and other options for treatment, the patient has consented to  Procedure(s): RIGHT/LEFT HEART CATH AND CORONARY ANGIOGRAPHY (N/A) as a surgical intervention.  The patient's history has been reviewed, patient examined, no change in status, stable for surgery.  I have reviewed the patient's chart and labs.  Questions were answered to the patient's satisfaction.     [name]

## 2023-02-05 NOTE — Interval H&P Note (Signed)
History and Physical Interval Note:  02/05/2023 10:59 AM  Gary Carroll  has presented today for surgery, with the diagnosis of aortic stenosis.  The various methods of treatment have been discussed with the patient and family. After consideration of risks, benefits and other options for treatment, the patient has consented to  Procedure(s): RIGHT/LEFT HEART CATH AND CORONARY ANGIOGRAPHY (N/A) as a surgical intervention.  The patient's history has been reviewed, patient examined, no change in status, stable for surgery.  I have reviewed the patient's chart and labs.  Questions were answered to the patient's satisfaction.     [name]

## 2023-02-05 NOTE — Progress Notes (Signed)
   02/05/23 1934  Vitals  Temp 97.8 F (36.6 C)  Temp Source Oral  BP 135/87  MAP (mmHg) 97  BP Location Left Arm  BP Method Automatic  Patient Position (if appropriate) Lying  Pulse Rate (!) 58  Pulse Rate Source Monitor  ECG Heart Rate 93  Resp 20  Level of Consciousness  Level of Consciousness Alert  MEWS COLOR  MEWS Score Color Green  Oxygen Therapy  SpO2 93 %  O2 Device Room Air  Pain Assessment  Pain Scale 0-10  Pain Score 0  MEWS Score  MEWS Temp 0  MEWS Systolic 0  MEWS Pulse 0  MEWS RR 0  MEWS LOC 0  MEWS Score 0   Admitted pt from cath lab, alert and oriented, denies pain, placed on cardiac monitor, CCMD made aware. TR band in placed, no bleeding/hematoma noted.

## 2023-02-06 ENCOUNTER — Other Ambulatory Visit: Payer: Self-pay | Admitting: Physician Assistant

## 2023-02-06 ENCOUNTER — Encounter (HOSPITAL_COMMUNITY): Payer: Self-pay | Admitting: Internal Medicine

## 2023-02-06 ENCOUNTER — Encounter: Payer: Self-pay | Admitting: Physician Assistant

## 2023-02-06 DIAGNOSIS — I350 Nonrheumatic aortic (valve) stenosis: Secondary | ICD-10-CM | POA: Diagnosis not present

## 2023-02-06 MED ORDER — POTASSIUM CHLORIDE ER 10 MEQ PO TBCR: 10.0000 meq | EXTENDED_RELEASE_TABLET | Freq: Every day | ORAL | 3 refills | Status: DC

## 2023-02-06 MED ORDER — FUROSEMIDE 40 MG PO TABS: 40.0000 mg | ORAL_TABLET | Freq: Two times a day (BID) | ORAL | 2 refills | Status: DC

## 2023-02-06 MED ORDER — METOPROLOL TARTRATE 50 MG PO TABS
50.0000 mg | ORAL_TABLET | Freq: Every day | ORAL | Status: DC
  Administered 2023-02-06 (×2): 50 mg via ORAL
  Filled 2023-02-06 (×2): qty 1

## 2023-02-06 NOTE — Discharge Summary (Addendum)
HEART AND VASCULAR CENTER   MULTIDISCIPLINARY HEART VALVE TEAM  Discharge Summary    Patient ID: Gary Carroll MRN: [id number]; DOB: 08/23/1945  Admit date: 02/05/2023 Discharge date: 02/06/2023  Primary Care Provider: Benita Stabile, MD  Primary Cardiologist: [facility] cardiology    Discharge Diagnoses    Principal Problem:   Nonrheumatic aortic valve stenosis   Allergies Allergies  Allergen Reactions   Diovan [Valsartan] Cough   Tagamet [Cimetidine] Other (See Comments)    irrittability    Diagnostic Studies/Procedures    [facility] 02/05/23 Procedures ABDOMINAL AORTOGRAM W/LOWER EXTREMITY  RIGHT HEART CATH AND CORONARY ANGIOGRAPHY   Conclusion      3rd Mrg-1 lesion is 70% stenosed.   3rd Mrg-2 lesion is 70% stenosed.   Prox LAD lesion is 80% stenosed.   1.  High-grade proximal LAD lesion with mild to moderate disease elsewhere; given the lack of exertional angina medical therapy will be pursued. 2.  Fick cardiac output of 5.3 L/min and Fick cardiac index of 2.3 L/min/m with the following hemodynamics:             Right atrial pressure mean of 10 mmHg             Right ventricular pressure 53/-1 with an end-diastolic pressure of 14 mmHg             Wedge pressure mean of 26 mmHg             Pulmonary artery pressure 57/21 with a mean of 30 mmHg             Pulmonary vascular resistance of 0.75 Woods units             Pulmonary artery pulsatility index of 3.6 3.  Capacious iliofemoral vessels bilaterally.   Recommendation: Increase Lasix to 40 mg twice daily and continue evaluation for aortic valve intervention. _____________   History of Present Illness     Gary Carroll is a 77 y.o. male with a history of HTN, HLD, morbid obesity (BMI 37), diet controlled DMT2, PAF on Eliquis, OSA on CPAP, recently diagnosed rectal mass (in work up) and severe AS who presented to [facility] on 02/05/23 for planned diagnostic L/RHC.   The patient is followed at the [place]. He recently  developed progressive DOE and fatigue. Echocardiogram 12/21/22 showed EF 60-65%, mod LVH, mild AR, and severe AS with a mean gradient of 41 mmhg. He was referred to Dr. Lynnette Caffey for structural heart consultation and set up for diagnostic L/RHC on 02/05/23.   Hospital Course     Consultants: none   Severe aortic stenosis: next step is TAVR CT scans and consultation with cardiothoracic surgery as a part of multidisciplinary approach to his care. Letter given to pt with instructions for these.   CAD: cath yesterday showed a high-grade proximal LAD lesion with mild to moderate disease elsewhere. Given the lack of exertional angina medical therapy will be pursued. Continue statin and BB. No aspirin given need for OAC.  HFpEF: elevated pressures on heart cath. Increase lasix from 60mg  daily to 40mg  BID and Kdur from daily to BID.   PAF: resume home Cardizem and Eliquis  Rectal mass: this is a recently new diagnosis by colonoscopy. He is  scheduled for an MRI and oncology and surgical consultations. He has an oncology appointment today at 2pm. Will away their input on diagnosis and prognosis.   _____________  Discharge Vitals Blood pressure 128/81, pulse (!) 108, temperature 98.4 F (  36.9 C), temperature source Oral, resp. rate 20, height 5\' 10"  (1.778 m), weight 120.1 kg, SpO2 96%.  Filed Weights   02/05/23 1116 02/06/23 0426  Weight: 121.1 kg 120.1 kg    Labs & Radiologic Studies    CBC Recent Labs    02/05/23 1652 02/05/23 1654  HGB 10.5* 10.5*  HCT 31.0* 31.0*   Basic Metabolic Panel Recent Labs    82/95/62 1652 02/05/23 1654  NA 147* 147*  K 3.7 3.7   Liver Function Tests No results for input(s): "AST", "ALT", "ALKPHOS", "BILITOT", "PROT", "ALBUMIN" in the last 72 hours. No results for input(s): "LIPASE", "AMYLASE" in the last 72 hours. Cardiac Enzymes No results for input(s): "CKTOTAL", "CKMB", "CKMBINDEX", "TROPONINI" in the last 72 hours. BNP Invalid  input(s): "POCBNP" D-Dimer No results for input(s): "DDIMER" in the last 72 hours. Hemoglobin A1C No results for input(s): "HGBA1C" in the last 72 hours. Fasting Lipid Panel No results for input(s): "CHOL", "HDL", "LDLCALC", "TRIG", "CHOLHDL", "LDLDIRECT" in the last 72 hours. Thyroid Function Tests No results for input(s): "TSH", "T4TOTAL", "T3FREE", "THYROIDAB" in the last 72 hours.  Invalid input(s): "FREET3" _____________  CARDIAC CATHETERIZATION  Result Date: 02/05/2023   3rd Mrg-1 lesion is 70% stenosed.   3rd Mrg-2 lesion is 70% stenosed.   Prox LAD lesion is 80% stenosed. 1.  High-grade proximal LAD lesion with mild to moderate disease elsewhere; given the lack of exertional angina medical therapy will be pursued. 2.  Fick cardiac output of 5.3 L/min and Fick cardiac index of 2.3 L/min/m with the following hemodynamics:  Right atrial pressure mean of 10 mmHg  Right ventricular pressure 53/-1 with an end-diastolic pressure of 14 mmHg  Wedge pressure mean of 26 mmHg  Pulmonary artery pressure 57/21 with a mean of 30 mmHg  Pulmonary vascular resistance of 0.75 Woods units  Pulmonary artery pulsatility index of 3.6 3.  Capacious iliofemoral vessels bilaterally. Recommendation: Increase Lasix to 40 mg twice daily and continue evaluation for aortic valve intervention.  PERIPHERAL VASCULAR CATHETERIZATION  Result Date: 02/05/2023   3rd Mrg-1 lesion is 70% stenosed.   3rd Mrg-2 lesion is 70% stenosed.   Prox LAD lesion is 80% stenosed. 1.  High-grade proximal LAD lesion with mild to moderate disease elsewhere; given the lack of exertional angina medical therapy will be pursued. 2.  Fick cardiac output of 5.3 L/min and Fick cardiac index of 2.3 L/min/m with the following hemodynamics:  Right atrial pressure mean of 10 mmHg  Right ventricular pressure 53/-1 with an end-diastolic pressure of 14 mmHg  Wedge pressure mean of 26 mmHg  Pulmonary artery pressure 57/21 with a mean of 30 mmHg  Pulmonary  vascular resistance of 0.75 Woods units  Pulmonary artery pulsatility index of 3.6 3.  Capacious iliofemoral vessels bilaterally. Recommendation: Increase Lasix to 40 mg twice daily and continue evaluation for aortic valve intervention.  MR PELVIS W WO CONTRAST  Result Date: 02/02/2023 CLINICAL DATA:  Rectal cancer, restaging EXAM: MRI PELVIS WITHOUT AND WITH CONTRAST TECHNIQUE: Multiplanar multisequence MR imaging of the pelvis was performed both before and after administration of intravenous contrast. Ultrasound gel was administered per rectum to optimize tumor evaluation. CONTRAST:  10mL GADAVIST GADOBUTROL 1 MMOL/ML IV SOLN COMPARISON:  PET-CT dated 04/24/2021 FINDINGS: TUMOR LOCATION Tumor distance from Anal Verge/Skin surface: 7 mm Tumor distance to Internal Anal sphincter: 0 cm (series 3/image 20) Tumor involves the majority of the internal anal sphincter. TUMOR DESCRIPTION Circumferential extent: Anterior, near circumferential, extending from 8:00 to 4:00 (  series 5/image 37) Tumor Size and volume: 1.7 x 2.9 cm in axial measurements (series 5/image 36), extending 5.9 cm in length (volume = 15 cm^3) T - CATEGORY Extension through Muscular[employer]: Yes 1-38mm=T3b, extending approximately 3 mm anteriorly Shortest Distance of any tumor/node from Mesorectal fascia: 1 mm Extramural Vascular Invasion/Tumor Thrombus: No Invasion of Anterior Peritoneal Reflection: Yes=T4 Involvement of Adjacent Organs or Pelvic Sidewall: No Levator Ani Involvement: No N - CATEGORY Mesorectal Lymph Nodes >=65mm: None=N0 Extra-mesorectal Lymphadenopathy: No Other: None. IMPRESSION: 5.9 cm anterior low rectal mass with involvement of the internal anal sphincter, as above. Involvement of the anterior peritoneal reflection. 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 Electronically Signed   By: Charline Bills M.D.   On: 02/02/2023 18:07   DG Chest  Port 1 View  Result Date: 01/21/2023 CLINICAL DATA:  Acute heart failure EXAM: PORTABLE CHEST 1 VIEW COMPARISON:  Yesterday FINDINGS: Diffuse interstitial opacity with some Kerley lines and cephalized blood flow, interlobular septal thickening seen on 01/17/2023 chest CT. Stable heart size and mediastinal contours. No visible effusion or pneumothorax. IMPRESSION: History of CHF with unchanged interstitial opacity. Electronically Signed   By: Tiburcio Pea M.D.   On: 01/21/2023 05:40   DG Chest Port 1 View  Result Date: 01/20/2023 CLINICAL DATA:  77 year old male with history of shortness of breath. EXAM: PORTABLE CHEST 1 VIEW COMPARISON:  Chest x-ray 12/20/2022. FINDINGS: Lung volumes are normal. Diffuse interstitial prominence and widespread peribronchial cuffing, concerning for potential acute bronchitis. Patchy ill-defined opacities are noted scattered randomly throughout the lungs bilaterally. No pleural effusions. No pneumothorax. No pulmonary nodule or mass noted. Pulmonary vasculature and the cardiomediastinal silhouette are within normal limits. Atherosclerosis in the thoracic aorta. IMPRESSION: 1. The appearance of the chest suggests possible bronchitis, and developing multilobar bronchopneumonia, as above. 2. Aortic atherosclerosis. Electronically Signed   By: Trudie Reed M.D.   On: 01/20/2023 07:33   CT Angio Chest PE W and/or Wo Contrast  Result Date: 01/17/2023 CLINICAL DATA:  Pulmonary embolism (PE) suspected, high prob Weakness.  History of colon cancer. EXAM: CT ANGIOGRAPHY CHEST WITH CONTRAST TECHNIQUE: Multidetector CT imaging of the chest was performed using the standard protocol during bolus administration of intravenous contrast. Multiplanar CT image reconstructions and MIPs were obtained to evaluate the vascular anatomy. RADIATION DOSE REDUCTION: This exam was performed according to the departmental dose-optimization program which includes automated exposure control, adjustment  of the mA and/or kV according to patient size and/or use of iterative reconstruction technique. CONTRAST:  OMNIPAQUE IOHEXOL 350 MG/ML SOLN COMPARISON:  Chest CTA 12/21/2022.  Additional priors reviewed. FINDINGS: Cardiovascular: There are no filling defects within the pulmonary arteries to suggest pulmonary embolus. Atherosclerosis of the thoracic aorta without aneurysm or acute aortic finding. Mild chronic cardiomegaly. Coronary artery calcifications. No pericardial effusion. Mediastinum/Nodes: Decreased size of many of the previously enlarged mediastinal lymph nodes. For example 11 mm right subcarinal/low hilar node measures 11 mm, series 4, image 60 previously 14 mm. AP window node measures 15 mm series 4, image 42, previously 17 mm. Multiple additional prominent and mildly enlarged mediastinal and hilar nodes are stable. No progressive adenopathy. No esophageal wall thickening. Lungs/Pleura: Stable appearance of consolidative left apical opacity from prior exam. Adjacent nodular densities measuring 9 mm series 6, image 42, grossly unchanged. There is smooth bilateral apical septal thickening, mildly improved. No acute airspace disease. Chronic bronchial thickening. No endobronchial lesion  or debris. No pleural fluid or pneumothorax. Upper Abdomen: Unchanged 15 mm left adrenal nodule. Cholecystectomy. No acute upper abdominal findings. Musculoskeletal: Stable presumed Schmorl's node superior endplate of T11. Additional degenerative change and Schmorl's nodes in the thoracic spine. Scoliosis. Review of the MIP images confirms the above findings. IMPRESSION: 1. No pulmonary embolus. 2. Stable appearance of consolidative left apical opacity from prior exam. Adjacent nodular densities measuring 9 mm, grossly unchanged. 3. Decreased size of many of the previously enlarged mediastinal lymph nodes. Multiple additional prominent and mildly enlarged mediastinal and hilar nodes are stable. 4. Unchanged left adrenal  nodule, likely adenoma. Aortic Atherosclerosis (ICD10-I70.0). Electronically Signed   By: Narda Rutherford M.D.   On: 01/17/2023 22:27   Disposition   Pt is being discharged home today in good condition.  Follow-up Plans & Appointments       Discharge Medications   Allergies as of 02/06/2023       Reactions   Diovan [valsartan] Cough   Tagamet [cimetidine] Other (See Comments)   irrittability        Medication List     TAKE these medications    apixaban 5 MG Tabs tablet Commonly known as: ELIQUIS Take 1 tablet (5 mg total) by mouth 2 (two) times daily.   ascorbic acid 500 MG tablet Commonly known as: VITAMIN C Take 500 mg by mouth daily.   diltiazem 120 MG 24 hr capsule Commonly known as: Cardizem CD Take 1 capsule (120 mg total) by mouth daily.   Fish Oil 1000 MG Caps Take 4 capsules by mouth daily.   furosemide 40 MG tablet Commonly known as: LASIX Take 1 tablet (40 mg total) by mouth 2 (two) times daily. You can take an additional 40mg  of Lasix as needed for shortness of breath or swelling What changed:  how much to take when to take this   loratadine 10 MG tablet Commonly known as: CLARITIN Take 10 mg by mouth daily as needed for allergies.   losartan 50 MG tablet Commonly known as: COZAAR Take 0.5 tablets (25 mg total) by mouth daily. What changed: how much to take   magnesium chloride 64 MG Tbec SR tablet Commonly known as: SLOW-MAG Take 1 tablet by mouth daily.   melatonin 5 MG Tabs Take 10 mg by mouth at bedtime.   metoprolol tartrate 50 MG tablet Commonly known as: LOPRESSOR Take 1 tablet (50 mg total) by mouth 2 (two) times daily.   Multivital tablet Take 1 tablet by mouth daily.   oxybutynin 5 MG tablet Commonly known as: DITROPAN Take 5 mg by mouth every evening.   pantoprazole 40 MG tablet Commonly known as: PROTONIX Take 40 mg by mouth daily.   potassium chloride 10 MEQ tablet Commonly known as: KLOR-CON Take 1 tablet (10  mEq total) by mouth daily.   rosuvastatin 20 MG tablet Commonly known as: CRESTOR Take 20 mg by mouth daily.          Outstanding Labs/Studies   none  Duration of Discharge Encounter   Greater than 30 minutes including physician time.  Signed, Cline Crock, PA-C 02/06/2023, 8:34 AM [phone]  I have personally seen and examined this patient. I agree with the assessment and plan as outlined above.  Right arm cath site ok without hematoma.  HR 100-110 bpm, atrial fib Pt has no complaints.  Will d/c home today.  Planning for TAVR with instructions for follow up testing will be reviewed with patient Lasix increased to 40 mg po  BID  Verne Carrow, MD, [facility] 02/06/2023 8:51 AM

## 2023-02-06 NOTE — TOC Transition Note (Signed)
Transition of Care [facility]) - CM/SW Discharge Note   Patient Details  Name: Gary Carroll MRN: [id number] Date of Birth: [birth year]  [facility] CM/SW Contact:  Leone Haven, RN Phone Number: 02/06/2023, 11:33 AM   Clinical Narrative:    Patient is for dc today, he has no needs. He states he does not have any oxygen at home because he did not need it.    Final next level of care: Home/Self Care Barriers to Discharge: No Barriers Identified   Patient Goals and CMS Choice   Choice offered to / list presented to : NA  Discharge Placement                         Discharge Plan and Services Additional resources added to the After Visit Summary for   In-house Referral: NA Discharge Planning Services: CM Consult Post Acute Care Choice: NA                    HH Arranged: NA          Social Determinants of Health (SDOH) Interventions SDOH Screenings   Food Insecurity: No Food Insecurity (02/05/2023)  Housing: Patient Declined (02/05/2023)  Transportation Needs: No Transportation Needs (02/05/2023)  Utilities: Not At Risk (02/05/2023)  Depression (PHQ2-9): Low Risk  (03/12/2019)  Tobacco Use: Medium Risk (01/29/2023)     Readmission Risk Interventions    02/07/2021    1:55 PM  Readmission Risk Prevention Plan  Transportation Screening Complete  PCP or Specialist Appt within 5-7 Days Complete  Home Care Screening Complete  Medication Review (RN CM) Referral to Pharmacy

## 2023-02-06 NOTE — Plan of Care (Signed)
Problem: Education: Goal: Understanding of CV disease, CV risk reduction, and recovery process will improve Outcome: Completed/Met Goal: Individualized Educational Video(s) Outcome: Completed/Met   Problem: Activity: Goal: Ability to return to baseline activity level will improve Outcome: Completed/Met   Problem: Cardiovascular: Goal: Ability to achieve and maintain adequate cardiovascular perfusion will improve Outcome: Completed/Met Goal: Vascular access site(s) Level 0-1 will be maintained Outcome: Completed/Met   Problem: Health Behavior/Discharge Planning: Goal: Ability to safely manage health-related needs after discharge will improve Outcome: Completed/Met   Problem: Education: Goal: Knowledge of General Education information will improve Description: Including pain rating scale, medication(s)/side effects and non-pharmacologic comfort measures Outcome: Completed/Met   Problem: Health Behavior/Discharge Planning: Goal: Ability to manage health-related needs will improve Outcome: Completed/Met   Problem: Clinical Measurements: Goal: Ability to maintain clinical measurements within normal limits will improve Outcome: Completed/Met Goal: Will remain free from infection Outcome: Completed/Met Goal: Diagnostic test results will improve Outcome: Completed/Met Goal: Respiratory complications will improve Outcome: Completed/Met Goal: Cardiovascular complication will be avoided Outcome: Completed/Met   Problem: Activity: Goal: Risk for activity intolerance will decrease Outcome: Completed/Met   Problem: Nutrition: Goal: Adequate nutrition will be maintained Outcome: Completed/Met   Problem: Coping: Goal: Level of anxiety will decrease Outcome: Completed/Met   Problem: Elimination: Goal: Will not experience complications related to bowel motility Outcome: Completed/Met Goal: Will not experience complications related to urinary retention Outcome: Completed/Met   Problem: Pain Managment: Goal: General experience of comfort will improve Outcome: Completed/Met   Problem: Safety: Goal: Ability to remain free from injury will improve Outcome: Completed/Met   Problem: Skin Integrity: Goal: Risk for impaired skin integrity will decrease Outcome: Completed/Met

## 2023-02-06 NOTE — TOC Initial Note (Addendum)
Transition of Care [facility]) - Initial/Assessment Note    Patient Details  Name: Gary Carroll MRN: [id number] Date of Birth: 1946/03/18  [facility]) CM/SW Contact:    Gary Haven, RN Phone Number: 02/06/2023, 11:25 AM  Clinical Narrative:                 From home alone,  has PCP and insurance on file, states has no HH services in place at this time , he has a cane at home.  States friend , Gary Carroll will transport him home at [employer]  and he has no  support system, states [facility].  Pta ambulatory with cane.  [facility] , [facility].  NCM notified Gary Carroll with [place].  PCP is Gary Carroll, CSW is Gary Carroll [phone] ext [phone].   Expected Discharge Plan: Home/Self Care Barriers to Discharge: No Barriers Identified   Patient Goals and CMS Choice Patient states their goals for this hospitalization and ongoing recovery are:: return home   Choice offered to / list presented to : NA      Expected Discharge Plan and Services In-house Referral: NA Discharge Planning Services: CM Consult Post Acute Care Choice: NA Living arrangements for the past 2 months: Single Family Home Expected Discharge Date: 02/06/23                         [facility] Arranged: NA          Prior Living Arrangements/Services Living arrangements for the past 2 months: Single Family Home Lives with:: Self Patient language and need for interpreter reviewed:: Yes Do you feel safe going back to the place where you live?: Yes      Need for Family Participation in Patient Care: No (Comment) Care giver support system in place?: No (comment) Current home services: DME (cane) Criminal Activity/Legal Involvement Pertinent to Current Situation/Hospitalization: No - Comment as needed  Activities of Daily Living Home Assistive Devices/Equipment: Cane (specify quad or straight) ADL Screening (condition at time of  admission) Patient's cognitive ability adequate to safely complete daily activities?: Yes Is the patient deaf or have difficulty hearing?: Yes Does the patient have difficulty seeing, even when wearing glasses/contacts?: No Does the patient have difficulty concentrating, remembering, or making decisions?: No Patient able to express need for assistance with ADLs?: Yes Does the patient have difficulty dressing or bathing?: No Independently performs ADLs?: Yes (appropriate for developmental age) Does the patient have difficulty walking or climbing stairs?: Yes Weakness of Legs: Both Weakness of Arms/Hands: None  Permission Sought/Granted Permission sought to share information with : Case Manager Permission granted to share information with : Yes, Verbal Permission Granted              Emotional Assessment Appearance:: Appears stated age Attitude/Demeanor/Rapport: Engaged Affect (typically observed): Appropriate Orientation: : Oriented to Self, Oriented to Place, Oriented to  Time, Oriented to Situation Alcohol / Substance Use: Not Applicable Psych Involvement: No (comment)  Admission diagnosis:  Nonrheumatic aortic valve stenosis [I35.0] Patient Active Problem List   Diagnosis Date Noted   Nonrheumatic aortic valve stenosis 02/05/2023   Acute heart failure with preserved ejection fraction (HFpEF) (HCC) 01/20/2023   SOB (shortness of breath) 12/23/2022   Acute respiratory failure with hypoxia (HCC) 12/21/2022   Elevated troponin 12/21/2022   Atrial fibrillation with RVR (HCC) 12/21/2022   Rectal pain 12/04/2022  Rectal bleed 12/04/2022   CAP (community acquired pneumonia) 06/03/2022   Obstructive lung disease (generalized) (HCC) 11/29/2021   Restrictive lung disease 06/26/2021   Lung nodules 04/30/2021   Chronic anticoagulation 03/14/2021   Aortic stenosis 03/14/2021   Pressure injury of skin 02/03/2021   Septic shock (HCC)    Acute respiratory failure with hypoxia and  hypercapnia (HCC)    Acute pulmonary edema (HCC)    Abnormal CT of the chest 10/23/2020   History of tobacco use 10/23/2020   Chronic knee pain 03/19/2018   Chronic postoperative pain 03/19/2018   Elevated transaminase level    Sleep apnea 10/01/2017   BPH (benign prostatic hyperplasia) 02/04/2017   Urinary tract infection 02/04/2017   Cellulitis of left lower extremity 02/03/2017   GERD (gastroesophageal reflux disease) 02/03/2017   AF (paroxysmal atrial fibrillation) (HCC) 02/03/2017   Sepsis due to cellulitis (HCC) 02/03/2017   New onset atrial fibrillation (HCC) 08/24/2015   Diuresis excessive 08/22/2015   Bladder outlet obstruction 08/22/2015   Hydronephrosis determined by ultrasound 08/22/2015   AKI (acute kidney injury) (HCC) 08/22/2015   Hypernatremia 08/22/2015   Acute blood loss anemia 08/22/2015   Hematuria 08/22/2015   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 ORDER) ELECTRONIC - [place], [place] - [address] [place] [address] Phone: [phone] Fax: [phone]  [id number] [place], [place] - [address] AT [facility] [address] [place] [address] Phone: [phone] Fax: [phone]  [facility] - [place], [place] - [address] [place] [address] Phone: [phone] Fax: [phone]     Social Determinants of Health (SDOH) Social History: SDOH Screenings   Food Insecurity: No Food Insecurity  (02/05/2023)  Housing: Patient Declined (02/05/2023)  Transportation Needs: No Transportation Needs (02/05/2023)  Utilities: Not At Risk (02/05/2023)  Depression (PHQ2-9): Low Risk  (03/12/2019)  Tobacco Use: Medium Risk (01/29/2023)   SDOH Interventions:     Readmission Risk Interventions    02/07/2021    1:55 PM  Readmission Risk Prevention Plan  Transportation Screening Complete  PCP or Specialist Appt within 5-7 Days Complete  Home Care Screening Complete  Medication Review (RN CM) Referral to Pharmacy

## 2023-02-06 NOTE — Progress Notes (Signed)
   02/06/23 0123  Assess: MEWS Score  Temp 98.4 F (36.9 C)  BP (!) 146/77  MAP (mmHg) 83  Pulse Rate (!) 135  ECG Heart Rate (!) 135  Resp (!) 21  Level of Consciousness Alert  SpO2 93 %  O2 Device Room Air  Assess: MEWS Score  MEWS Temp 0  MEWS Systolic 0  MEWS Pulse 3  MEWS RR 1  MEWS LOC 0  MEWS Score 4  MEWS Score Color Red  Assess: if the MEWS score is Yellow or Red  Were vital signs accurate and taken at a resting state? Yes  Does the patient meet 2 or more of the SIRS criteria? No  MEWS guidelines implemented  Yes, red  Treat  MEWS Interventions Considered administering scheduled or prn medications/treatments as ordered  Take Vital Signs  Increase Vital Sign Frequency  Red: Q1hr x2, continue Q4hrs until patient remains green for 12hrs  Escalate  MEWS: Escalate Red: Discuss with charge nurse and notify provider. Consider notifying RRT. If remains red for 2 hours consider need for higher level of care  Notify: Charge Nurse/RN  Name of Charge Nurse/RN Notified Renato Shin RN  Provider Notification  Provider Name/Title Dr. Maximino Sarin  Date Provider Notified 02/06/23  Time Provider Notified 0145  Method of Notification Page  Notification Reason Other (Comment) (Red MEWS)  Provider response See new orders  Date of Provider Response 02/06/23  Time of Provider Response 0150  Assess: SIRS CRITERIA  SIRS Temperature  0  SIRS Pulse 1  SIRS Respirations  1  SIRS WBC 0  SIRS Score Sum  2   Pt's HR in the 130's sustained, Resp =21, pt alert and oriented denies any symptoms. Provider ordered metoprolol 50mg  po.

## 2023-02-06 NOTE — Progress Notes (Signed)
  [facility]   MULTIDISCIPLINARY HEART VALVE TEAM   Patient scheduled for pre-TAVR CT scans and surgical evaluation prior to discharge today. CT instruction letter given and reviewed with patient with understanding. All questions answered.    Georgie Chard NP-C [place]  [facility] Structural Heart Team  Direct Dial: [phone]  Pager: [phone] [email or web address]

## 2023-02-08 LAB — LIPOPROTEIN A (LPA): Lipoprotein (a): 55.8 nmol/L — ABNORMAL HIGH (ref <75.0)

## 2023-02-10 NOTE — Progress Notes (Incomplete)
GI Location of Tumor / Histology: Rectal  Charmayne Sheer [place] presented with blood in his stool, worsening  SOB and fatigue.   MRI Rectum 01/28/2023: 5.9 cm anterior low rectal mass with involvement of the internal anal sphincter.  Involvement of hte anterior peritoneal reflection.  T4N0M0, 0 cm from rectal verge.  Distance from tumor to the internal anal sphincter is 0 cm.  CT AP 01/22/2023: Thickening in the low rectum/anal rectal junction, indeterminate and could represent malignancy as per the clinical history but this is not well defined or evaluated on CT imaging.  Scattered prominent to mildly enlarged hilar or mediastinal lymph nodes are increased from the prior exam and indeterminate.  Colonoscopy: Frond like/villous, fungating and infiltrative partially obstructing mass was found in the rectum.  Measures approximately 3-4 cm in length.  Biopsies of rectum (if applicable) revealed:  01-13-23 FINAL MICROSCOPIC DIAGNOSIS:   A. RECTAL MASS, BIOPSY:  Invasive moderately differentiated colonic adenocarcinoma.  See comment.   COMMENT:  Dr. Reynolds Bowl agrees.   Dr. Marletta Lor was notified via Epic message service on 01/15/2023.    Past/Anticipated interventions by surgeon, if any:    Past/Anticipated interventions by medical oncology, if any:  Dr. Corky Downs 01/2023- [facility] -ChemoRT with capecitabine followed by FOLFOX with possible watchful waiting if pCR is obtained vs LAR subsequently depending on functional status. -Given his current functional status we will defer chemoRT until after TAVR placement. -[facility] Rad Onc consult placed today.  -Follow-up 3 weeks.   Weight changes, if any: has maintained a stable weight, he did report some weight loss early on   Bowel/Bladder complaints, if any: is on lasix and he does urinate with that, pt self caths. If you urinate without the the self cath it makes you have bowel movements.   Nausea / Vomiting, if any: none to report  Pain issues, if any: Pt does report  rectal pain intermittently with sitting. He does take Tylenol at time for pain.   Any blood per rectum:   intermittent with wiping at times, none lately  SAFETY ISSUES: Prior radiation? no Pacemaker/ICD? no Possible current pregnancy? N/a Is the patient on methotrexate? no  Current Complaints/Details: How will this radiation treatment effect driving? Pt wants to know what treatment will look like overall (schedule etc.) Pt very uncomfortable when he sits in car to drive and sitting in general can be difficult. Pt also reported fatigue but feels this is coming from a current heart valve issue.

## 2023-02-12 ENCOUNTER — Ambulatory Visit
Admission: RE | Admit: 2023-02-12 | Discharge: 2023-02-12 | Disposition: A | Discharge status: Home / Self Care | Payer: Self-pay | Source: Ambulatory Visit | Attending: Radiation Oncology | Admitting: Radiation Oncology

## 2023-02-12 ENCOUNTER — Encounter: Payer: Self-pay | Admitting: Radiation Oncology

## 2023-02-12 ENCOUNTER — Other Ambulatory Visit: Payer: Self-pay | Admitting: Radiation Oncology

## 2023-02-12 ENCOUNTER — Ambulatory Visit
Admission: RE | Admit: 2023-02-12 | Discharge: 2023-02-12 | Disposition: A | Discharge status: Home / Self Care | Payer: No Typology Code available for payment source | Source: Ambulatory Visit | Attending: Radiation Oncology | Admitting: Radiation Oncology

## 2023-02-12 ENCOUNTER — Telehealth: Payer: Self-pay | Admitting: *Deleted

## 2023-02-12 DIAGNOSIS — R350 Frequency of micturition: Secondary | ICD-10-CM | POA: Diagnosis not present

## 2023-02-12 DIAGNOSIS — Z7901 Long term (current) use of anticoagulants: Secondary | ICD-10-CM | POA: Diagnosis not present

## 2023-02-12 DIAGNOSIS — Z79899 Other long term (current) drug therapy: Secondary | ICD-10-CM | POA: Insufficient documentation

## 2023-02-12 DIAGNOSIS — C20 Malignant neoplasm of rectum: Secondary | ICD-10-CM

## 2023-02-12 DIAGNOSIS — K219 Gastro-esophageal reflux disease without esophagitis: Secondary | ICD-10-CM | POA: Insufficient documentation

## 2023-02-12 DIAGNOSIS — R590 Localized enlarged lymph nodes: Secondary | ICD-10-CM | POA: Insufficient documentation

## 2023-02-12 DIAGNOSIS — Z923 Personal history of irradiation: Secondary | ICD-10-CM | POA: Diagnosis not present

## 2023-02-12 DIAGNOSIS — J45909 Unspecified asthma, uncomplicated: Secondary | ICD-10-CM | POA: Insufficient documentation

## 2023-02-12 DIAGNOSIS — G4730 Sleep apnea, unspecified: Secondary | ICD-10-CM | POA: Insufficient documentation

## 2023-02-12 DIAGNOSIS — Z808 Family history of malignant neoplasm of other organs or systems: Secondary | ICD-10-CM | POA: Insufficient documentation

## 2023-02-12 DIAGNOSIS — D449 Neoplasm of uncertain behavior of unspecified endocrine gland: Secondary | ICD-10-CM | POA: Diagnosis not present

## 2023-02-12 DIAGNOSIS — I700 Atherosclerosis of aorta: Secondary | ICD-10-CM | POA: Insufficient documentation

## 2023-02-12 DIAGNOSIS — R918 Other nonspecific abnormal finding of lung field: Secondary | ICD-10-CM | POA: Insufficient documentation

## 2023-02-12 DIAGNOSIS — I10 Essential (primary) hypertension: Secondary | ICD-10-CM | POA: Diagnosis not present

## 2023-02-12 DIAGNOSIS — I2510 Atherosclerotic heart disease of native coronary artery without angina pectoris: Secondary | ICD-10-CM | POA: Insufficient documentation

## 2023-02-12 DIAGNOSIS — G893 Neoplasm related pain (acute) (chronic): Secondary | ICD-10-CM | POA: Diagnosis not present

## 2023-02-12 DIAGNOSIS — Z87891 Personal history of nicotine dependence: Secondary | ICD-10-CM | POA: Insufficient documentation

## 2023-02-12 DIAGNOSIS — R3915 Urgency of urination: Secondary | ICD-10-CM | POA: Diagnosis not present

## 2023-02-12 DIAGNOSIS — I4891 Unspecified atrial fibrillation: Secondary | ICD-10-CM | POA: Diagnosis not present

## 2023-02-12 NOTE — Telephone Encounter (Signed)
Called patient to inform of Pet Scan for 02-13-23- arrival time- 9 am @ [facility], patient to have water only- 6 hrs. prior to test, spoke with patient and he is aware of this scan and the instructions

## 2023-02-12 NOTE — Progress Notes (Cosign Needed)
Radiation Oncology         [phone]) [phone] ________________________________  Initial Outpatient Consultation - Conducted via telephone at patient request.  I spoke with the patient to conduct this consult visit via telephone. The patient was notified in advance and was offered an in person or telemedicine meeting to allow for face to face communication but instead preferred to proceed with a telephone consult.   Name: Gary Carroll        MRN: [id number]  Date of Service: 02/12/2023 DOB: 08/04/1945  WU:[id number], Kathleene Hazel, MD  Ulla Gallo, MD     REFERRING PHYSICIAN: Ulla Gallo, MD   DIAGNOSIS: The encounter diagnosis was Malignant neoplasm of rectum [facility]).   HISTORY OF PRESENT ILLNESS: Gary Carroll is a 77 y.o. male seen at the request of Dr. Corky Downs and Dr. Lavonia Dana at the [facility]. The patient presented with shortness of breath, fatigue, and blood per rectum. A colonoscopy on 02/13/23 revealed a mass in the distal rectum and a biopsy confirmed an adenocarcinoma. A   CT scan on 01/23/23 of the CAP identified thickening along the rectum, enlarged lymph nodes in the  hilum and mediastinum 10-12 mm in size, and stable lymph nodes in the upper abdomen. The scan also noted bilateral inguinal adenopathy. An MRI of the rectum on 01/28/23 showed a T4N0 tumor measuring 5.9 cm, located 0 cm from the internal anal sphincter. Of note, the patient has cardiac valve disease and is planning to undergo a TAVR with Dr. Laneta Simmers on 02/25/2023.  He also has a history of a pulmonary nodule followed by Dr. Delton Coombes and pulmonary medicine, his most recent CT scan on 01/17/2023 due to shortness of breath showed no embolism but stable appearance in the left apical opacity that was read as stable and in comparison to March 2024 measured approximately 5.4 cm with a clustered area of nodularity measuring 1.7 cm.  He is contacted today to discuss chemoradiation after his upcoming TAVR.  Of note he has not met with colorectal  surgery.  PREVIOUS RADIATION THERAPY: No   PAST MEDICAL HISTORY:  Past Medical History:  Diagnosis Date   A-fib (HCC)    Allergic rhinitis    Arthritis    Asthma    seasonal per pt   Bilateral knee pain    OA   CAD (coronary artery disease)    Diverticulosis 02/2012   seen on colonoscopy   Erectile dysfunction    GERD (gastroesophageal reflux disease)    Hiatal hernia    HLD (hyperlipidemia)    HTN (hypertension)    Hydronephrosis determined by ultrasound 08/22/2015   Overactive bladder    Prediabetes    Pulmonary lesion    Ringing of ears    Severe aortic stenosis    Sleep apnea    Urinary frequency    Urinary urgency    Wears glasses        PAST SURGICAL HISTORY: Past Surgical History:  Procedure Laterality Date   ABDOMINAL AORTOGRAM W/LOWER EXTREMITY N/A 02/05/2023   Procedure: ABDOMINAL AORTOGRAM W/LOWER EXTREMITY;  Surgeon: [name], MD;  Location: [facility] INVASIVE CV [facility];  Service: Cardiovascular;  Laterality: N/A;   CATARACT EXTRACTION W/PHACO Left 12/07/2021   Procedure: CATARACT EXTRACTION PHACO AND INTRAOCULAR LENS PLACEMENT (IOC);  Surgeon: Fabio Pierce, MD;  Location: [facility] ORS;  Service: Ophthalmology;  Laterality: Left;  CDE 7.75   CATARACT EXTRACTION W/PHACO Right 12/21/2021   Procedure: CATARACT EXTRACTION PHACO AND INTRAOCULAR LENS PLACEMENT (IOC);  Surgeon:  Fabio Pierce, MD;  Location: [facility] ORS;  Service: Ophthalmology;  Laterality: Right;  CDE: 8.00   CHOLECYSTECTOMY     COLONOSCOPY  02/2012   Dr. Arlyce Dice: sigmoid, transverse, and ascending colon diverticulosis   COLONOSCOPY WITH PROPOFOL N/A 01/13/2023   Procedure: COLONOSCOPY WITH PROPOFOL;  Surgeon: Lanelle Bal, DO;  Location: [facility] ENDO SUITE;  Service: Endoscopy;  Laterality: N/A;  1:00 pm, asa 3   FINGER SURGERY Right    prostate procedure  2017   per patient, was having trouble urinating    RIGHT HEART CATH AND CORONARY ANGIOGRAPHY N/A 02/05/2023   Procedure: RIGHT HEART CATH AND CORONARY  ANGIOGRAPHY;  Surgeon: [name], MD;  Location: [facility];  Service: Cardiovascular;  Laterality: N/A;   TONSILLECTOMY     TOTAL KNEE ARTHROPLASTY  2012   left (Dr. 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/19/2015   Procedure: TOTAL KNEE ARTHROPLASTY;  Surgeon: Dannielle Huh, MD;  Location: [facility] OR;  Service: Orthopedics;  Laterality: Right;     FAMILY HISTORY:  Family History  Problem Relation Age of Onset   Uterine cancer Mother    Cancer Mother        uterine   Coronary artery disease Father    Coronary artery disease Paternal Grandfather    Heart disease Paternal Grandfather    Colon cancer Neg Hx    Esophageal cancer Neg Hx    Stomach cancer Neg Hx    Rectal cancer Neg Hx    Liver disease Neg Hx      SOCIAL HISTORY:  reports that he quit smoking about 13 years ago. His smoking use included cigarettes. He has never used smokeless tobacco. He reports that he does not currently use alcohol after a past usage of about 1.0 - 2.0 standard drink of alcohol per week. He reports that he does not use drugs.  The patient is divorced and lives in [place].  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  He has lived in many countries over his career, he hopes to go for a month-long trip to [place] to visit with friends after completing his cancer treatment.   ALLERGIES: Diovan [valsartan] and Tagamet [cimetidine]   MEDICATIONS:  Current Outpatient Medications  Medication Sig Dispense Refill   apixaban (ELIQUIS) 5 MG TABS tablet Take 1 tablet (5 mg total) by mouth 2 (two) times daily. 60 tablet 2   diltiazem (CARDIZEM CD) 120 MG 24 hr capsule Take 1 capsule (120 mg total) by mouth daily. 30 capsule 0   furosemide (LASIX) 40 MG tablet Take 1 tablet (40 mg total) by mouth 2 (two) times daily. You can take an additional 40mg  of Lasix as needed for shortness of breath or swelling 60 tablet 2   loratadine  (CLARITIN) 10 MG tablet Take 10 mg by mouth daily as needed for allergies.     losartan (COZAAR) 50 MG tablet Take 0.5 tablets (25 mg total) by mouth daily. (Patient taking differently: Take 50 mg by mouth daily.)     magnesium chloride (SLOW-MAG) 64 MG TBEC SR tablet Take 1 tablet by mouth daily.     Melatonin 5 MG TABS Take 10 mg by mouth at bedtime.      metoprolol tartrate (LOPRESSOR) 50 MG tablet Take 1 tablet (50 mg total) by mouth 2 (two) times daily. 60 tablet 0   Multiple Vitamins-Minerals (MULTIVITAL) tablet Take 1 tablet by mouth daily.  Omega-3 Fatty Acids (FISH OIL) 1000 MG CAPS Take 4 capsules by mouth daily.      oxybutynin (DITROPAN) 5 MG tablet Take 5 mg by mouth every evening.     pantoprazole (PROTONIX) 40 MG tablet Take 40 mg by mouth daily.     potassium chloride (KLOR-CON) 10 MEQ tablet Take 1 tablet (10 mEq total) by mouth daily. 60 tablet 3   rosuvastatin (CRESTOR) 20 MG tablet Take 20 mg by mouth daily.     vitamin C (ASCORBIC ACID) 500 MG tablet Take 500 mg by mouth daily.     No current facility-administered medications for this encounter.     REVIEW OF SYSTEMS: On review of systems, the patient reports that he is doing okay.  He states that he does have pain with sitting for long periods of time especially when sitting in a car.  He has tried multiple pillows and cushions without any significant improvement in this when in a car, he is not interested in narcotic pain medication as he provides his own transportation and does his own driving.  He did find over-the-counter lidocaine cream that he is considering trying as well and denies any palpable fullness at the exterior portion of the anal canal.  He denies any additional rectal bleeding.  He describes fatigue and feels this is related to his valvular disease.  No other complaints are verbalized     PHYSICAL EXAM:  Unable to assess given encounter type.   ECOG = 1  0 - Asymptomatic (Fully active, able to  carry on all predisease activities without restriction)  1 - Symptomatic but completely ambulatory (Restricted in physically strenuous activity but ambulatory and able to carry out work of a light or sedentary nature. For example, light housework, office work)  2 - Symptomatic, <50% in bed during the day (Ambulatory and capable of all self care but unable to carry out any work activities. Up and about more than 50% of waking hours)  3 - Symptomatic, >50% in bed, but not bedbound (Capable of only limited self-care, confined to bed or chair 50% or more of waking hours)  4 - Bedbound (Completely disabled. Cannot carry on any self-care. Totally confined to bed or chair)  5 - Death   Santiago Glad MM, Creech RH, Tormey DC, et al. [phone]). "Toxicity and response criteria of the [facility]". Am. Evlyn Clines. Oncol. 5 (6): 649-55    LABORATORY DATA:  Lab Results  Component Value Date   WBC 9.0 01/21/2023   HGB 10.5 (L) 02/05/2023   HCT 31.0 (L) 02/05/2023   MCV 87.9 01/21/2023   PLT 169 01/21/2023   Lab Results  Component Value Date   NA 147 (H) 02/05/2023   K 3.7 02/05/2023   CL 103 01/21/2023   CO2 27 01/21/2023   Lab Results  Component Value Date   ALT 31 01/20/2023   AST 30 01/20/2023   ALKPHOS 55 01/20/2023   BILITOT 0.9 01/20/2023      RADIOGRAPHY: CARDIAC CATHETERIZATION  Result Date: 02/05/2023   3rd Mrg-1 lesion is 70% stenosed.   3rd Mrg-2 lesion is 70% stenosed.   Prox LAD lesion is 80% stenosed. 1.  High-grade proximal LAD lesion with mild to moderate disease elsewhere; given the lack of exertional angina medical therapy will be pursued. 2.  Fick cardiac output of 5.3 L/min and Fick cardiac index of 2.3 L/min/m with the following hemodynamics:  Right atrial pressure mean of 10 mmHg  Right ventricular pressure  53/-1 with an end-diastolic pressure of 14 mmHg  Wedge pressure mean of 26 mmHg  Pulmonary artery pressure 57/21 with a mean of 30 mmHg  Pulmonary  vascular resistance of 0.75 Woods units  Pulmonary artery pulsatility index of 3.6 3.  Capacious iliofemoral vessels bilaterally. Recommendation: Increase Lasix to 40 mg twice daily and continue evaluation for aortic valve intervention.  PERIPHERAL VASCULAR CATHETERIZATION  Result Date: 02/05/2023   3rd Mrg-1 lesion is 70% stenosed.   3rd Mrg-2 lesion is 70% stenosed.   Prox LAD lesion is 80% stenosed. 1.  High-grade proximal LAD lesion with mild to moderate disease elsewhere; given the lack of exertional angina medical therapy will be pursued. 2.  Fick cardiac output of 5.3 L/min and Fick cardiac index of 2.3 L/min/m with the following hemodynamics:  Right atrial pressure mean of 10 mmHg  Right ventricular pressure 53/-1 with an end-diastolic pressure of 14 mmHg  Wedge pressure mean of 26 mmHg  Pulmonary artery pressure 57/21 with a mean of 30 mmHg  Pulmonary vascular resistance of 0.75 Woods units  Pulmonary artery pulsatility index of 3.6 3.  Capacious iliofemoral vessels bilaterally. Recommendation: Increase Lasix to 40 mg twice daily and continue evaluation for aortic valve intervention.  MR PELVIS W WO CONTRAST  Result Date: 02/02/2023 CLINICAL DATA:  Rectal cancer, restaging EXAM: MRI PELVIS WITHOUT AND WITH CONTRAST TECHNIQUE: Multiplanar multisequence MR imaging of the pelvis was performed both before and after administration of intravenous contrast. Ultrasound gel was administered per rectum to optimize tumor evaluation. CONTRAST:  10mL GADAVIST GADOBUTROL 1 MMOL/ML IV SOLN COMPARISON:  PET-CT dated 04/24/2021 FINDINGS: TUMOR LOCATION Tumor distance from Anal Verge/Skin surface: 7 mm Tumor distance to Internal Anal sphincter: 0 cm (series 3/image 20) Tumor involves the majority of the internal anal sphincter. TUMOR DESCRIPTION Circumferential extent: Anterior, near circumferential, extending from 8:00 to 4:00 (series 5/image 37) Tumor Size and volume: 1.7 x 2.9 cm in axial measurements (series  5/image 36), extending 5.9 cm in length (volume = 15 cm^3) T - CATEGORY Extension through Muscularis [employer]pria: Yes 1-57mm=T3b, extending approximately 3 mm anteriorly Shortest Distance of any tumor/node from Mesorectal fascia: 1 mm Extramural Vascular Invasion/Tumor Thrombus: No Invasion of Anterior Peritoneal Reflection: Yes=T4 Involvement of Adjacent Organs or Pelvic Sidewall: No Levator Ani Involvement: No N - CATEGORY Mesorectal Lymph Nodes >=82mm: None=N0 Extra-mesorectal Lymphadenopathy: No Other: None. IMPRESSION: 5.9 cm anterior low rectal mass with involvement of the internal anal sphincter, as above. Involvement of the anterior peritoneal reflection. 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 Electronically Signed   By: Charline Bills M.D.   On: 02/02/2023 18:07   DG Chest Port 1 View  Result Date: 01/21/2023 CLINICAL DATA:  Acute heart failure EXAM: PORTABLE CHEST 1 VIEW COMPARISON:  Yesterday FINDINGS: Diffuse interstitial opacity with some Kerley lines and cephalized blood flow, interlobular septal thickening seen on 01/17/2023 chest CT. Stable heart size and mediastinal contours. No visible effusion or pneumothorax. IMPRESSION: History of CHF with unchanged interstitial opacity. Electronically Signed   By: Tiburcio Pea M.D.   On: 01/21/2023 05:40   DG Chest Port 1 View  Result Date: 01/20/2023 CLINICAL DATA:  77 year old male with history of shortness of breath. EXAM: PORTABLE CHEST 1 VIEW COMPARISON:  Chest x-ray 12/20/2022. FINDINGS: Lung volumes are normal. Diffuse interstitial prominence and widespread peribronchial cuffing, concerning for potential acute bronchitis. Patchy ill-defined opacities are noted scattered randomly throughout the  lungs bilaterally. No pleural effusions. No pneumothorax. No pulmonary nodule or mass noted. Pulmonary vasculature and the cardiomediastinal silhouette are within normal  limits. Atherosclerosis in the thoracic aorta. IMPRESSION: 1. The appearance of the chest suggests possible bronchitis, and developing multilobar bronchopneumonia, as above. 2. Aortic atherosclerosis. Electronically Signed   By: Trudie Reed M.D.   On: 01/20/2023 07:33   CT Angio Chest PE W and/or Wo Contrast  Result Date: 01/17/2023 CLINICAL DATA:  Pulmonary embolism (PE) suspected, high prob Weakness.  History of colon cancer. EXAM: CT ANGIOGRAPHY CHEST WITH CONTRAST TECHNIQUE: Multidetector CT imaging of the chest was performed using the standard protocol during bolus administration of intravenous contrast. Multiplanar CT image reconstructions and MIPs were obtained to evaluate the vascular anatomy. RADIATION DOSE REDUCTION: This exam was performed according to the departmental dose-optimization program which includes automated exposure control, adjustment of the mA and/or kV according to patient size and/or use of iterative reconstruction technique. CONTRAST:  OMNIPAQUE IOHEXOL 350 MG/ML SOLN COMPARISON:  Chest CTA 12/21/2022.  Additional priors reviewed. FINDINGS: Cardiovascular: There are no filling defects within the pulmonary arteries to suggest pulmonary embolus. Atherosclerosis of the thoracic aorta without aneurysm or acute aortic finding. Mild chronic cardiomegaly. Coronary artery calcifications. No pericardial effusion. Mediastinum/Nodes: Decreased size of many of the previously enlarged mediastinal lymph nodes. For example 11 mm right subcarinal/low hilar node measures 11 mm, series 4, image 60 previously 14 mm. AP window node measures 15 mm series 4, image 42, previously 17 mm. Multiple additional prominent and mildly enlarged mediastinal and hilar nodes are stable. No progressive adenopathy. No esophageal wall thickening. Lungs/Pleura: Stable appearance of consolidative left apical opacity from prior exam. Adjacent nodular densities measuring 9 mm series 6, image 42, grossly unchanged.  There is smooth bilateral apical septal thickening, mildly improved. No acute airspace disease. Chronic bronchial thickening. No endobronchial lesion or debris. No pleural fluid or pneumothorax. Upper Abdomen: Unchanged 15 mm left adrenal nodule. Cholecystectomy. No acute upper abdominal findings. Musculoskeletal: Stable presumed Schmorl's node superior endplate of T11. Additional degenerative change and Schmorl's nodes in the thoracic spine. Scoliosis. Review of the MIP images confirms the above findings. IMPRESSION: 1. No pulmonary embolus. 2. Stable appearance of consolidative left apical opacity from prior exam. Adjacent nodular densities measuring 9 mm, grossly unchanged. 3. Decreased size of many of the previously enlarged mediastinal lymph nodes. Multiple additional prominent and mildly enlarged mediastinal and hilar nodes are stable. 4. Unchanged left adrenal nodule, likely adenoma. Aortic Atherosclerosis (ICD10-I70.0). Electronically Signed   By: Narda Rutherford M.D.   On: 01/17/2023 22:27       IMPRESSION/PLAN: 1. At least Stage IIB, cT4N0M0, adenocarcinoma of the distal rectum. Dr. Mitzi Hansen discusses the pathology findings and reviews the nature of  rectal carcinoma. Dr. Mitzi Hansen recommends a PET scan to assess for potential metastatic disease given his adenopathy, and history of LUL nodule. Referral  to colorectal surgeon was also recommended for evaluation and potential surgical planning after completed chemoradiation. Dr. Mitzi Hansen outlines the goals of chemoradiation provided he does not have metastatic disease. We discussed the risks, benefits, short, and long term effects of radiotherapy, as well as the curative intent, and the patient is interested in proceeding. Dr. Mitzi Hansen discusses the delivery and logistics of radiotherapy and anticipates a course of 5 1/2-6 weeks of radiotherapy to the distal rectum and regional nodes of the pelvis. Once we have a PET scan date scheduled, the patient will be  contacted to coordinate treatment planning by our simulation  department. He will be asked to sign written consent at that time. We hope to start treatment 03/03/23. 2. LUL nodule with history of mediastinal and hilar adenopathy. We will follow up with his PET as above and appreciate Dr. Kavin Leech input if abnormal. 3. 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 We discussed the importance of evaluation with physical therapy prior to pelvic radiation. A referral was placed to physical therapy today.  4. Pain secondary to #1. We discussed options of narcotic pain medication which the patient would like to hold off on at this time. In the meantime he is going to sparingly try OTC lidocaine to the anal region when he drives. He also may try Dermoplast spray for local anesthetic as well.  5. Cardiac Valve Disease. The patient will go for TAVR on 02/25/23. We will plan to start radiation following this procedure.      This encounter was conducted via telephone.  The patient has provided two factor identification and has given verbal consent for this type of encounter and has been advised to only accept a meeting of this type in a secure network environment. The time spent during this encounter was 60 minutes including preparation, discussion, and coordination of the patient's care. The attendants for this meeting include Dr. Mitzi Hansen, Ronny Bacon  and Charmayne Sheer Cappucci.  During the encounter,  Dr. Mitzi Hansen, and Ronny Bacon were located at [facility] Radiation Oncology Department.  Andhy Stitzel Keir was located at home.    The above documentation reflects my direct findings during this shared patient visit. Please see the separate note by Dr. Mitzi Hansen on this date for the remainder of the patient's plan of care.    Osker Mason, [facility]   **Disclaimer: This note was dictated with voice recognition software. Similar sounding words can inadvertently be transcribed and  this note may contain transcription errors which may not have been corrected upon publication of note. I Discussed the use of AI scribe software for clinical note transcription with the patient, who gave verbal consent to proceed. **

## 2023-02-12 NOTE — Telephone Encounter (Signed)
xxxx

## 2023-02-13 ENCOUNTER — Ambulatory Visit (HOSPITAL_COMMUNITY): Payer: No Typology Code available for payment source

## 2023-02-13 ENCOUNTER — Ambulatory Visit (HOSPITAL_COMMUNITY)
Admission: RE | Admit: 2023-02-13 | Discharge: 2023-02-13 | Disposition: A | Discharge status: Home / Self Care | Payer: No Typology Code available for payment source | Source: Ambulatory Visit | Attending: Radiation Oncology | Admitting: Radiation Oncology

## 2023-02-13 DIAGNOSIS — C20 Malignant neoplasm of rectum: Secondary | ICD-10-CM | POA: Insufficient documentation

## 2023-02-13 LAB — GLUCOSE, CAPILLARY: Glucose-Capillary: 108 mg/dL — ABNORMAL HIGH (ref 70–99)

## 2023-02-13 MED ORDER — FLUDEOXYGLUCOSE F - 18 (FDG) INJECTION
13.0000 | Freq: Once | INTRAVENOUS | Status: AC | PRN
  Administered 2023-02-13: 13 via INTRAVENOUS

## 2023-02-13 NOTE — Addendum Note (Signed)
Encounter addended by: Dorothy Puffer, MD on: 02/13/2023 12:04 PM  Actions taken: Clinical Note Signed

## 2023-02-17 ENCOUNTER — Telehealth: Payer: Self-pay | Admitting: Radiation Oncology

## 2023-02-17 NOTE — Telephone Encounter (Signed)
I spoke with the patient regarding his PET scan results.  Dr. Mitzi Hansen anticipates planning his treatment to the rectum and regional nodes of the pelvis including the inguinal lymph nodes purely based on the location of his primary tumor and drainage pathway of this, though no overt macro scopic disease is appreciated by PET.  We discussed that he is still having a lot of fatigue and is wanting to find out if his TAVR procedure can be performed any sooner.  I gave him Dr. Erin Fulling office number to try and call.  The patient also confirmed that he is open to seeing colorectal surgery in the [facility] office as there had been misunderstanding amongst our scheduling office.  I will reach out to our schedulers to coordinate the referral that we had placed last week.  Otherwise we will plan to see him on Friday for simulation and anticipate starting treatment on 03/03/2023

## 2023-02-18 ENCOUNTER — Ambulatory Visit (HOSPITAL_COMMUNITY)
Admission: RE | Admit: 2023-02-18 | Discharge: 2023-02-18 | Disposition: A | Discharge status: Home / Self Care | Payer: No Typology Code available for payment source | Source: Ambulatory Visit | Attending: Internal Medicine | Admitting: Internal Medicine

## 2023-02-18 ENCOUNTER — Ambulatory Visit: Payer: Medicare HMO | Attending: Radiation Oncology | Admitting: Physical Therapy

## 2023-02-18 ENCOUNTER — Ambulatory Visit (HOSPITAL_BASED_OUTPATIENT_CLINIC_OR_DEPARTMENT_OTHER)
Admission: RE | Admit: 2023-02-18 | Discharge: 2023-02-18 | Disposition: A | Discharge status: Home / Self Care | Payer: No Typology Code available for payment source | Source: Ambulatory Visit | Attending: Physician Assistant | Admitting: Physician Assistant

## 2023-02-18 ENCOUNTER — Encounter: Payer: Self-pay | Admitting: Physical Therapy

## 2023-02-18 ENCOUNTER — Other Ambulatory Visit: Payer: Self-pay

## 2023-02-18 DIAGNOSIS — R269 Unspecified abnormalities of gait and mobility: Secondary | ICD-10-CM

## 2023-02-18 DIAGNOSIS — R279 Unspecified lack of coordination: Secondary | ICD-10-CM | POA: Diagnosis not present

## 2023-02-18 DIAGNOSIS — M62838 Other muscle spasm: Secondary | ICD-10-CM

## 2023-02-18 DIAGNOSIS — I350 Nonrheumatic aortic (valve) stenosis: Secondary | ICD-10-CM

## 2023-02-18 DIAGNOSIS — C20 Malignant neoplasm of rectum: Secondary | ICD-10-CM | POA: Diagnosis not present

## 2023-02-18 DIAGNOSIS — M6281 Muscle weakness (generalized): Secondary | ICD-10-CM

## 2023-02-18 DIAGNOSIS — R293 Abnormal posture: Secondary | ICD-10-CM

## 2023-02-18 MED ORDER — METOPROLOL TARTRATE 5 MG/5ML IV SOLN: 10.0000 mg | Freq: Once | INTRAVENOUS | Status: DC

## 2023-02-18 MED ORDER — METOPROLOL TARTRATE 5 MG/5ML IV SOLN
5.0000 mg | INTRAVENOUS | Status: DC | PRN
  Administered 2023-02-18 (×4): 5 mg via INTRAVENOUS

## 2023-02-18 MED ORDER — IOHEXOL 350 MG/ML SOLN
95.0000 mL | Freq: Once | INTRAVENOUS | Status: AC | PRN
  Administered 2023-02-18: 95 mL via INTRAVENOUS

## 2023-02-18 MED ORDER — METOPROLOL TARTRATE 5 MG/5ML IV SOLN
INTRAVENOUS | Status: AC
  Filled 2023-02-18: qty 20

## 2023-02-18 NOTE — Therapy (Signed)
Patient Name: Gary Carroll MRN: [id number] DOB:Feb 01, 1946, 77 y.o., male Today's Date: 02/18/2023   OUTPATIENT PHYSICAL THERAPY MALE PELVIC EVALUATION   Patient Name: Gary Carroll MRN: [id number] DOB:Mar 08, 1946, 77 y.o., male Today's Date: 02/18/2023  END OF SESSION:  PT End of Session - 02/18/23 1527     Visit Number 1    Date for PT Re-Evaluation 08/19/23    Authorization Type [employer] MCR    Progress Note Due on Visit 10    PT Start Time 1445    PT Stop Time 1502   due to fatigue   PT Time Calculation (min) 17 min    Activity Tolerance Patient tolerated treatment well    Behavior During Therapy [facility] for tasks assessed/performed             Past Medical History:  Diagnosis Date   A-fib (HCC)    Allergic rhinitis    Arthritis    Asthma    seasonal per pt   Bilateral knee pain    OA   CAD (coronary artery disease)    Diverticulosis 02/2012   seen on colonoscopy   Erectile dysfunction    GERD (gastroesophageal reflux disease)    Hiatal hernia    HLD (hyperlipidemia)    HTN (hypertension)    Hydronephrosis determined by ultrasound 08/22/2015   Overactive bladder    Prediabetes    Pulmonary lesion    Ringing of ears    Severe aortic stenosis    Sleep apnea    Urinary frequency    Urinary urgency    Wears glasses    Past Surgical History:  Procedure Laterality Date   ABDOMINAL AORTOGRAM W/LOWER EXTREMITY N/A 02/05/2023   Procedure: ABDOMINAL AORTOGRAM W/LOWER EXTREMITY;  Surgeon: [name], MD;  Location: [facility];  Service: Cardiovascular;  Laterality: N/A;   CATARACT EXTRACTION W/PHACO Left 12/07/2021   Procedure: CATARACT EXTRACTION PHACO AND INTRAOCULAR LENS PLACEMENT (IOC);  Surgeon: Fabio Pierce, MD;  Location: [facility];  Service: Ophthalmology;  Laterality: Left;  CDE 7.75   CATARACT EXTRACTION W/PHACO Right 12/21/2021   Procedure: CATARACT EXTRACTION PHACO AND INTRAOCULAR LENS PLACEMENT (IOC);  Surgeon: Fabio Pierce,  MD;  Location: [facility];  Service: Ophthalmology;  Laterality: Right;  CDE: 8.00   CHOLECYSTECTOMY     COLONOSCOPY  02/2012   Dr. Arlyce Dice: sigmoid, transverse, and ascending colon diverticulosis   COLONOSCOPY WITH PROPOFOL N/A 01/13/2023   Procedure: COLONOSCOPY WITH PROPOFOL;  Surgeon: Lanelle Bal, DO;  Location: [facility];  Service: Endoscopy;  Laterality: N/A;  1:00 pm, asa 3   FINGER SURGERY Right    prostate procedure  2017   per patient, was having trouble urinating    RIGHT HEART CATH AND CORONARY ANGIOGRAPHY N/A 02/05/2023   Procedure: RIGHT HEART CATH AND CORONARY ANGIOGRAPHY;  Surgeon: [name], MD;  Location: [facility];  Service: Cardiovascular;  Laterality: N/A;   TONSILLECTOMY     TOTAL KNEE ARTHROPLASTY  2012   left (Dr. 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/19/2015   Procedure: TOTAL KNEE ARTHROPLASTY;  Surgeon: Dannielle Huh, MD;  Location: [facility];  Service: Orthopedics;  Laterality: Right;   Patient Active Problem List   Diagnosis Date Noted   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   Nonrheumatic aortic valve stenosis 02/05/2023   Acute heart failure with preserved ejection fraction (HFpEF) (HCC) 01/20/2023   SOB (shortness of breath) 12/23/2022   Acute respiratory failure with  hypoxia (HCC) 12/21/2022   Elevated troponin 12/21/2022   Atrial fibrillation with RVR (HCC) 12/21/2022   Rectal pain 12/04/2022   Rectal bleed 12/04/2022   CAP (community acquired pneumonia) 06/03/2022   Obstructive lung disease (generalized) (HCC) 11/29/2021   Restrictive lung disease 06/26/2021   Lung nodules 04/30/2021   Chronic anticoagulation 03/14/2021   Aortic stenosis 03/14/2021   Pressure injury of skin 02/03/2021   Septic shock (HCC)    Acute respiratory failure with hypoxia and hypercapnia (HCC)    Acute pulmonary edema (HCC)    Abnormal CT of the chest 10/23/2020   History of tobacco use 10/23/2020   Chronic knee pain 03/19/2018   Chronic  postoperative pain 03/19/2018   Elevated transaminase level    Sleep apnea 10/01/2017   BPH (benign prostatic hyperplasia) 02/04/2017   Urinary tract infection 02/04/2017   Cellulitis of left lower extremity 02/03/2017   GERD (gastroesophageal reflux disease) 02/03/2017   AF (paroxysmal atrial fibrillation) (HCC) 02/03/2017   Sepsis due to cellulitis (HCC) 02/03/2017   New onset atrial fibrillation (HCC) 08/24/2015   Diuresis excessive 08/22/2015   Bladder outlet obstruction 08/22/2015   Hydronephrosis determined by ultrasound 08/22/2015   AKI (acute kidney injury) (HCC) 08/22/2015   Hypernatremia 08/22/2015   Acute blood loss anemia 08/22/2015   Hematuria 08/22/2015   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   Peripheral edema 08/31/2012   Urinary frequency 08/31/2012   Diverticulosis 03/23/2012   Impaired fasting glucose 02/06/2012   Decreased libido 03/07/2011   Erectile dysfunction 03/07/2011   Essential hypertension, benign 12/20/2010   Mixed hyperlipidemia 12/20/2010   ALLERGIC RHINITIS, SEASONAL 05/02/2010   SCIATICA 05/02/2010   HIATAL HERNIA, HX OF 05/02/2010    PCP: Nita Sells, MD  REFERRING PROVIDER: Ronny Bacon, PA-C   REFERRING DIAG: C20 (ICD-10-CM) - Malignant neoplasm of rectum (HCC)  THERAPY DIAG:  Muscle weakness (generalized)  Abnormality of gait and mobility  Abnormal posture  Other muscle spasm  Unspecified lack of coordination  Rationale for Evaluation and Treatment: Rehabilitation  ONSET DATE: a couple months ago  SUBJECTIVE:                                                                                                                                                                                           SUBJECTIVE STATEMENT: I have pain with sitting and have short window from urge to having to go the bathroom.   "I am just too tired to do a lot today"  Fluid intake: "I don't keep track"  PAIN:  Are you having pain?  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  Sexual abuse: no  BOWEL MOVEMENT: Pain with bowel movement: No Type of bowel movement:Type (Bristol Stool Scale) 4-6, Frequency daily but varies, and Strain No Fully empty rectum: Yes:   Leakage: No but when I have an urge I have to go Pads: No Fiber supplement: No  URINATION: Pain with urination: No Fully empty bladder: Yes: with catheter Stream: Strong with catheter  Urgency: No Frequency: timed with catheter  Leakage:  no Pads: No  INTERCOURSE: Pain with intercourse:  not active    OBJECTIVE:  Note: Objective measures were completed at Evaluation unless otherwise noted.  DIAGNOSTIC FINDINGS:  MRI Rectum 01/28/2023: 5.9 cm anterior low rectal mass with involvement of the internal anal sphincter.  Involvement of hte anterior peritoneal reflection.  T4N0M0, 0 cm from rectal verge.  Distance from tumor to the internal anal sphincter is 0 cm.   COGNITION: Overall cognitive status: Within functional limits for tasks assessed     SENSATION: Light touch: Appears intact Proprioception: Appears intact  MUSCLE LENGTH: Unable to assess as pt unable to tolerate activity today  LUMBAR SPECIAL TESTS:  Unable to assess as pt  unable to tolerate activity today  FUNCTIONAL TESTS:  Unable to assess as pt unable to tolerate activity today  GAIT: Moderate assist transfer from waiting room chair to [facility], dependent for propulsion   POSTURE: rounded shoulders, forward head, and flexed trunk   PELVIC ALIGNMENT:Unable to assess as pt unable to tolerate activity today  LUMBARAROM/PROM:  A/PROM A/PROM  eval  Flexion   Extension   Right lateral flexion   Left lateral flexion   Right rotation   Left rotation    (Blank rows = not tested) Unable to assess as pt unable to tolerate activity today  LOWER EXTREMITY AROM/PROM:  Unable to assess as pt unable to tolerate activity today  LOWER EXTREMITY MMT:  Unable to assess as pt unable to tolerate activity today  PALPATION: GENERAL pt deferred palpation at abdomen,back, hips and requested to do this at next visit              External Perineal Exam deferred              Internal Pelvic Floor deferred  Patient confirms identification and approves PT to assess internal pelvic floor and treatment No  PELVIC MMT:   MMT eval  Internal Anal Sphincter   External Anal Sphincter   Puborectalis   Diastasis Recti   (Blank rows = not tested)  TONE: Deferred   TODAY'S TREATMENT:  DATE:   02/18/23 EVAL Examination completed, findings reviewed, pt educated on POC, energy conservation and importance of mobility within tolerance during cancer treatments. Pt reports he was too tired after appointment at the hospital to complete physical portion of evaluation, requests to only do history intake today. PT explained evaluation and that we could reschedule evaluation if pt did not feel able to complete tasks. Pt reports he wanted to only do the intake and when return for follow up could do physical portion of evaluation. Pt verbalized understanding of  education for energy conservation and attempting to be mobile within tolerance during cancer treatments.     PATIENT EDUCATION:  Education details: energy conservation and importance of mobility within tolerance during cancer treatments. Person educated: Patient Education method: Explanation, Demonstration, Tactile cues, Verbal cues, and Handouts Education comprehension: verbalized understanding and returned demonstration  HOME EXERCISE PROGRAM: energy conservation and importance of mobility within tolerance during cancer treatments.  ASSESSMENT:  CLINICAL IMPRESSION: Patient is a 77 y.o. male  who was seen today for physical therapy evaluation and treatment for with T4N0 adenocarcinoma of the distal rectum. Pt does have complex current history of TAVR scheduled and plans to start chemo/radiation this month as well. Pt presents today with decreased tolerance to activity requiring mod A to transfer to/from WC, max A for WC propulsion and decline any physical assessment during evaluation today, requesting to wait until next appointment. PT did message referring provider to update on status and she did recommend pt returning to outpatient PT after TAVR as his fatigue should improve after this. Pt would benefit from additional PT to further address deficits.    OBJECTIVE IMPAIRMENTS: decreased activity tolerance, decreased balance, decreased coordination, decreased endurance, decreased mobility, difficulty walking, decreased strength, increased fascial restrictions, increased muscle spasms, impaired flexibility, improper body mechanics, postural dysfunction, and pain.   ACTIVITY LIMITATIONS: carrying, lifting, bending, sitting, standing, squatting, transfers, continence, and locomotion level  PARTICIPATION LIMITATIONS: community activity  PERSONAL FACTORS: Age, Fitness, Time since onset of injury/illness/exacerbation, and 1 comorbidity: medical history  are also affecting patient's functional  outcome.   REHAB POTENTIAL: Fair    CLINICAL DECISION MAKING: Unstable/unpredictable  EVALUATION COMPLEXITY: High   GOALS: Goals reviewed with patient? Yes  SHORT TERM GOALS: Target date: 03/18/23  Pt to be I with HEP.  Baseline: Goal status: INITIAL   LONG TERM GOALS: Target date: 08/19/2023  Pt to be I with advanced HEP.  Baseline:  Goal status: INITIAL  2.  Pt to return for re assessment once cardiac procedure completed and cleared for PT to have new baseline evaluation completed.   Baseline:  Goal status: INITIAL   PLAN:  PT FREQUENCY: 1-2x/week once cleared after cardiac procedure    PT DURATION:  8 sessions  PLANNED INTERVENTIONS: Therapeutic exercises, Therapeutic activity, Neuromuscular re-education, Balance training, Gait training, Patient/Family education, Self Care, Joint mobilization, Aquatic Therapy, Dry Needling, scar mobilization, Taping, Biofeedback, Manual therapy, and Re-evaluation  PLAN FOR NEXT SESSION: reassess new baseline once cardiac procedure complete     Otelia Sergeant, PT, DPT 10/01/244:17 PM

## 2023-02-19 ENCOUNTER — Other Ambulatory Visit: Payer: Self-pay

## 2023-02-19 ENCOUNTER — Institutional Professional Consult (permissible substitution) (INDEPENDENT_AMBULATORY_CARE_PROVIDER_SITE_OTHER): Payer: No Typology Code available for payment source | Admitting: Surgery

## 2023-02-19 ENCOUNTER — Encounter: Payer: Self-pay | Admitting: Surgery

## 2023-02-19 VITALS — BP 120/76 | HR 114 | Resp 18 | Ht 70.0 in | Wt 264.0 lb

## 2023-02-19 DIAGNOSIS — I350 Nonrheumatic aortic (valve) stenosis: Secondary | ICD-10-CM

## 2023-02-19 NOTE — Progress Notes (Signed)
Patient ID: Gary Carroll, male   DOB: [birth year], 77 y.o.   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  His most recent echo on 12/21/2022 showed a severely calcified and thickened aortic valve with a mean gradient of 41 mmHg and a valve area of 0.93 cm by VTI consistent with severe aortic stenosis.  Left ventricular ejection fraction was 60 to 65%.  He underwent cardiac catheterization on 02/05/2023 showing an 80% proximal LAD stenosis with mild to moderate disease elsewhere.  PA pressure was elevated at 57/21 with a mean of 30.  Cardiac index was 2.3.  He reports that he was feeling fairly well until about 3 months ago when he began noticing increasing shortness of breath with exertion while swimming.  Since then he has decreased his activity but noted worsening fatigue.  He denies dizziness and syncope.  He has chronic lower extremity edema.  His recently noted soreness when sitting down which led to a colonoscopy and detection of the rectum adenocarcinoma. He is having considerable pain from this when sitting down.    Past Medical History:  Diagnosis Date   A-fib [facility])    Allergic rhinitis    Arthritis    Asthma    seasonal per pt   Bilateral knee pain    OA    CAD (coronary artery disease)    Diverticulosis 02/2012   seen on colonoscopy   Erectile dysfunction    GERD (gastroesophageal reflux disease)    Hiatal hernia    HLD (hyperlipidemia)    HTN (hypertension)    Hydronephrosis determined by ultrasound 08/22/2015   Overactive bladder    Prediabetes    Pulmonary lesion    Ringing of ears    Severe aortic stenosis    Sleep apnea    Urinary frequency    Urinary urgency    Wears glasses     Past Surgical History:  Procedure Laterality Date   ABDOMINAL AORTOGRAM W/LOWER EXTREMITY N/A 02/05/2023   Procedure: ABDOMINAL AORTOGRAM W/LOWER EXTREMITY;  Surgeon: [name], MD;  [facility]: [facility];  Service: Cardiovascular;  Laterality: N/A;   CATARACT EXTRACTION W/PHACO Left 12/07/2021   Procedure: CATARACT EXTRACTION PHACO AND INTRAOCULAR LENS PLACEMENT (IOC);  Surgeon: Fabio Pierce, MD;  Location: [facility];  Service: Ophthalmology;  Laterality: Left;  CDE 7.75   CATARACT EXTRACTION W/PHACO Right 12/21/2021   Procedure: CATARACT EXTRACTION PHACO AND INTRAOCULAR LENS PLACEMENT (IOC);  Surgeon: Fabio Pierce, MD;  Location: [facility];  Service: Ophthalmology;  Laterality: Right;  CDE: 8.00   CHOLECYSTECTOMY     COLONOSCOPY  02/2012   Dr. Arlyce Dice: sigmoid, transverse, and ascending colon diverticulosis   COLONOSCOPY WITH PROPOFOL N/A 01/13/2023   Procedure: COLONOSCOPY  ill-appearing  HEENT:  Unremarkable, NCAT, PERLA, EOMI  Neck:   no JVD, no bruits, no adenopathy   Chest:   clear to auscultation, symmetrical breath sounds, no wheezes, no rhonchi   CV:   RRR, 3/6 systolic murmur RSB, no diastolic murmur  Abdomen:  soft, non-tender, no masses   Extremities:  warm, well-perfused, pedal pulses not palpable, moderate bilateral lower extremity edema  Rectal/GU  Deferred  Neuro:   Grossly non-focal and symmetrical throughout  Skin:   Clean and dry, no rashes, no breakdown  Diagnostic Tests:  ECHOCARDIOGRAM REPORT       Patient Name:   Gary Carroll Date of Exam: 12/21/2022  Medical Rec #:  [id number]          Height:       69.0 in  Accession #:    [id number]         Weight:       265.0 lb  Date of Birth:  02-11-1946         BSA:          2.328 m  Patient Age:    77 years           BP:           145/72 mmHg  Patient Gender: M                  HR:           83 bpm.  [name]   Procedure: 2D Echo, Cardiac Doppler and Color  Doppler   Indications:    Congestive Heart Failure I50.9    History:        Patient has prior history of Echocardiogram examinations,  most                 recent 02/04/2021. COPD, Arrythmias:Atrial Fibrillation;  Risk                 Factors:Hypertension, Dyslipidemia and Current Smoker.  Acute                 respiratory failure with hypoxia (HCC), Morbid Obesity.    Sonographer:    Celesta Gentile RCS  Referring Phys: [id number] ASIA B ZIERLE-GHOSH   IMPRESSIONS     1. Left ventricular ejection fraction, by estimation, is 60 to 65%. The  left ventricle has normal function. The left ventricle has no regional  wall motion abnormalities. There is moderate left ventricular hypertrophy.  Left ventricular diastolic  parameters are indeterminate. Elevated left ventricular end-diastolic  pressure.   2. Right ventricular systolic function is normal. The right ventricular  size is normal. Tricuspid regurgitation signal is inadequate for assessing  PA pressure.   3. Left atrial size was moderately dilated.   4. The mitral valve is abnormal. No evidence of mitral valve  regurgitation. No evidence of mitral stenosis. Moderate to severe mitral  annular calcification.   5. The aortic valve is tricuspid. There is severe calcifcation of the  aortic valve. Aortic valve regurgitation is mild. Severe aortic valve  stenosis. Aortic valve area, by VTI measures 0.93 cm. Aortic valve mean  gradient measures 41.0 mmHg. Aortic  valve Vmax measures 4.19 m/s. DVI is 0.26.   6. The inferior vena cava is normal in size with <50% respiratory  variability, suggesting right atrial pressure of 8 mmHg.   7. Increased flow velocities may be secondary to anemia, thyrotoxicosis,  hyperdynamic or high flow state.  ill-appearing  HEENT:  Unremarkable, NCAT, PERLA, EOMI  Neck:   no JVD, no bruits, no adenopathy   Chest:   clear to auscultation, symmetrical breath sounds, no wheezes, no rhonchi   CV:   RRR, 3/6 systolic murmur RSB, no diastolic murmur  Abdomen:  soft, non-tender, no masses   Extremities:  warm, well-perfused, pedal pulses not palpable, moderate bilateral lower extremity edema  Rectal/GU  Deferred  Neuro:   Grossly non-focal and symmetrical throughout  Skin:   Clean and dry, no rashes, no breakdown  Diagnostic Tests:  ECHOCARDIOGRAM REPORT       Patient Name:   Gary Carroll Date of Exam: 12/21/2022  Medical Rec #:  [id number]          Height:       69.0 in  Accession #:    [id number]         Weight:       265.0 lb  Date of Birth:  02-11-1946         BSA:          2.328 m  Patient Age:    77 years           BP:           145/72 mmHg  Patient Gender: M                  HR:           83 bpm.  [facility] Location[name]   Procedure: 2D Echo, Cardiac Doppler and Color  Doppler   Indications:    Congestive Heart Failure I50.9    History:        Patient has prior history of Echocardiogram examinations,  most                 recent 02/04/2021. COPD, Arrythmias:Atrial Fibrillation;  Risk                 Factors:Hypertension, Dyslipidemia and Current Smoker.  Acute                 respiratory failure with hypoxia (HCC), Morbid Obesity.    Sonographer:    Celesta Gentile RCS  Referring Phys: [id number] ASIA B ZIERLE-GHOSH   IMPRESSIONS     1. Left ventricular ejection fraction, by estimation, is 60 to 65%. The  left ventricle has normal function. The left ventricle has no regional  wall motion abnormalities. There is moderate left ventricular hypertrophy.  Left ventricular diastolic  parameters are indeterminate. Elevated left ventricular end-diastolic  pressure.   2. Right ventricular systolic function is normal. The right ventricular  size is normal. Tricuspid regurgitation signal is inadequate for assessing  PA pressure.   3. Left atrial size was moderately dilated.   4. The mitral valve is abnormal. No evidence of mitral valve  regurgitation. No evidence of mitral stenosis. Moderate to severe mitral  annular calcification.   5. The aortic valve is tricuspid. There is severe calcifcation of the  aortic valve. Aortic valve regurgitation is mild. Severe aortic valve  stenosis. Aortic valve area, by VTI measures 0.93 cm. Aortic valve mean  gradient measures 41.0 mmHg. Aortic  valve Vmax measures 4.19 m/s. DVI is 0.26.   6. The inferior vena cava is normal in size with <50% respiratory  variability, suggesting right atrial pressure of 8 mmHg.   7. Increased flow velocities may be secondary to anemia, thyrotoxicosis,  hyperdynamic or high flow state.  Patient ID: Gary Carroll, male   DOB: [birth year], 77 y.o.   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  His most recent echo on 12/21/2022 showed a severely calcified and thickened aortic valve with a mean gradient of 41 mmHg and a valve area of 0.93 cm by VTI consistent with severe aortic stenosis.  Left ventricular ejection fraction was 60 to 65%.  He underwent cardiac catheterization on 02/05/2023 showing an 80% proximal LAD stenosis with mild to moderate disease elsewhere.  PA pressure was elevated at 57/21 with a mean of 30.  Cardiac index was 2.3.  He reports that he was feeling fairly well until about 3 months ago when he began noticing increasing shortness of breath with exertion while swimming.  Since then he has decreased his activity but noted worsening fatigue.  He denies dizziness and syncope.  He has chronic lower extremity edema.  His recently noted soreness when sitting down which led to a colonoscopy and detection of the rectum adenocarcinoma. He is having considerable pain from this when sitting down.    Past Medical History:  Diagnosis Date   A-fib [facility])    Allergic rhinitis    Arthritis    Asthma    seasonal per pt   Bilateral knee pain    OA    CAD (coronary artery disease)    Diverticulosis 02/2012   seen on colonoscopy   Erectile dysfunction    GERD (gastroesophageal reflux disease)    Hiatal hernia    HLD (hyperlipidemia)    HTN (hypertension)    Hydronephrosis determined by ultrasound 08/22/2015   Overactive bladder    Prediabetes    Pulmonary lesion    Ringing of ears    Severe aortic stenosis    Sleep apnea    Urinary frequency    Urinary urgency    Wears glasses     Past Surgical History:  Procedure Laterality Date   ABDOMINAL AORTOGRAM W/LOWER EXTREMITY N/A 02/05/2023   Procedure: ABDOMINAL AORTOGRAM W/LOWER EXTREMITY;  Surgeon: [name], MD;  [facility]: [facility];  Service: Cardiovascular;  Laterality: N/A;   CATARACT EXTRACTION W/PHACO Left 12/07/2021   Procedure: CATARACT EXTRACTION PHACO AND INTRAOCULAR LENS PLACEMENT (IOC);  Surgeon: Fabio Pierce, MD;  Location: [facility];  Service: Ophthalmology;  Laterality: Left;  CDE 7.75   CATARACT EXTRACTION W/PHACO Right 12/21/2021   Procedure: CATARACT EXTRACTION PHACO AND INTRAOCULAR LENS PLACEMENT (IOC);  Surgeon: Fabio Pierce, MD;  Location: [facility];  Service: Ophthalmology;  Laterality: Right;  CDE: 8.00   CHOLECYSTECTOMY     COLONOSCOPY  02/2012   Dr. Arlyce Dice: sigmoid, transverse, and ascending colon diverticulosis   COLONOSCOPY WITH PROPOFOL N/A 01/13/2023   Procedure: COLONOSCOPY  ill-appearing  HEENT:  Unremarkable, NCAT, PERLA, EOMI  Neck:   no JVD, no bruits, no adenopathy   Chest:   clear to auscultation, symmetrical breath sounds, no wheezes, no rhonchi   CV:   RRR, 3/6 systolic murmur RSB, no diastolic murmur  Abdomen:  soft, non-tender, no masses   Extremities:  warm, well-perfused, pedal pulses not palpable, moderate bilateral lower extremity edema  Rectal/GU  Deferred  Neuro:   Grossly non-focal and symmetrical throughout  Skin:   Clean and dry, no rashes, no breakdown  Diagnostic Tests:  ECHOCARDIOGRAM REPORT       Patient Name:   Gary Carroll Date of Exam: 12/21/2022  Medical Rec #:  [id number]          Height:       69.0 in  Accession #:    [id number]         Weight:       265.0 lb  Date of Birth:  02-11-1946         BSA:          2.328 m  Patient Age:    77 years           BP:           145/72 mmHg  Patient Gender: M                  HR:           83 bpm.  [facility] Location[name]   Procedure: 2D Echo, Cardiac Doppler and Color  Doppler   Indications:    Congestive Heart Failure I50.9    History:        Patient has prior history of Echocardiogram examinations,  most                 recent 02/04/2021. COPD, Arrythmias:Atrial Fibrillation;  Risk                 Factors:Hypertension, Dyslipidemia and Current Smoker.  Acute                 respiratory failure with hypoxia (HCC), Morbid Obesity.    Sonographer:    Celesta Gentile RCS  Referring Phys: [id number] ASIA B ZIERLE-GHOSH   IMPRESSIONS     1. Left ventricular ejection fraction, by estimation, is 60 to 65%. The  left ventricle has normal function. The left ventricle has no regional  wall motion abnormalities. There is moderate left ventricular hypertrophy.  Left ventricular diastolic  parameters are indeterminate. Elevated left ventricular end-diastolic  pressure.   2. Right ventricular systolic function is normal. The right ventricular  size is normal. Tricuspid regurgitation signal is inadequate for assessing  PA pressure.   3. Left atrial size was moderately dilated.   4. The mitral valve is abnormal. No evidence of mitral valve  regurgitation. No evidence of mitral stenosis. Moderate to severe mitral  annular calcification.   5. The aortic valve is tricuspid. There is severe calcifcation of the  aortic valve. Aortic valve regurgitation is mild. Severe aortic valve  stenosis. Aortic valve area, by VTI measures 0.93 cm. Aortic valve mean  gradient measures 41.0 mmHg. Aortic  valve Vmax measures 4.19 m/s. DVI is 0.26.   6. The inferior vena cava is normal in size with <50% respiratory  variability, suggesting right atrial pressure of 8 mmHg.   7. Increased flow velocities may be secondary to anemia, thyrotoxicosis,  hyperdynamic or high flow state.  ill-appearing  HEENT:  Unremarkable, NCAT, PERLA, EOMI  Neck:   no JVD, no bruits, no adenopathy   Chest:   clear to auscultation, symmetrical breath sounds, no wheezes, no rhonchi   CV:   RRR, 3/6 systolic murmur RSB, no diastolic murmur  Abdomen:  soft, non-tender, no masses   Extremities:  warm, well-perfused, pedal pulses not palpable, moderate bilateral lower extremity edema  Rectal/GU  Deferred  Neuro:   Grossly non-focal and symmetrical throughout  Skin:   Clean and dry, no rashes, no breakdown  Diagnostic Tests:  ECHOCARDIOGRAM REPORT       Patient Name:   Gary Carroll Date of Exam: 12/21/2022  Medical Rec #:  [id number]          Height:       69.0 in  Accession #:    [id number]         Weight:       265.0 lb  Date of Birth:  02-11-1946         BSA:          2.328 m  Patient Age:    77 years           BP:           145/72 mmHg  Patient Gender: M                  HR:           83 bpm.  [name]   Procedure: 2D Echo, Cardiac Doppler and Color  Doppler   Indications:    Congestive Heart Failure I50.9    History:        Patient has prior history of Echocardiogram examinations,  most                 recent 02/04/2021. COPD, Arrythmias:Atrial Fibrillation;  Risk                 Factors:Hypertension, Dyslipidemia and Current Smoker.  Acute                 respiratory failure with hypoxia (HCC), Morbid Obesity.    Sonographer:    Celesta Gentile RCS  Referring Phys: [id number] ASIA B ZIERLE-GHOSH   IMPRESSIONS     1. Left ventricular ejection fraction, by estimation, is 60 to 65%. The  left ventricle has normal function. The left ventricle has no regional  wall motion abnormalities. There is moderate left ventricular hypertrophy.  Left ventricular diastolic  parameters are indeterminate. Elevated left ventricular end-diastolic  pressure.   2. Right ventricular systolic function is normal. The right ventricular  size is normal. Tricuspid regurgitation signal is inadequate for assessing  PA pressure.   3. Left atrial size was moderately dilated.   4. The mitral valve is abnormal. No evidence of mitral valve  regurgitation. No evidence of mitral stenosis. Moderate to severe mitral  annular calcification.   5. The aortic valve is tricuspid. There is severe calcifcation of the  aortic valve. Aortic valve regurgitation is mild. Severe aortic valve  stenosis. Aortic valve area, by VTI measures 0.93 cm. Aortic valve mean  gradient measures 41.0 mmHg. Aortic  valve Vmax measures 4.19 m/s. DVI is 0.26.   6. The inferior vena cava is normal in size with <50% respiratory  variability, suggesting right atrial pressure of 8 mmHg.   7. Increased flow velocities may be secondary to anemia, thyrotoxicosis,  hyperdynamic or high flow state.  Patient ID: Gary Carroll, male   DOB: [birth year], 77 y.o.   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  His most recent echo on 12/21/2022 showed a severely calcified and thickened aortic valve with a mean gradient of 41 mmHg and a valve area of 0.93 cm by VTI consistent with severe aortic stenosis.  Left ventricular ejection fraction was 60 to 65%.  He underwent cardiac catheterization on 02/05/2023 showing an 80% proximal LAD stenosis with mild to moderate disease elsewhere.  PA pressure was elevated at 57/21 with a mean of 30.  Cardiac index was 2.3.  He reports that he was feeling fairly well until about 3 months ago when he began noticing increasing shortness of breath with exertion while swimming.  Since then he has decreased his activity but noted worsening fatigue.  He denies dizziness and syncope.  He has chronic lower extremity edema.  His recently noted soreness when sitting down which led to a colonoscopy and detection of the rectum adenocarcinoma. He is having considerable pain from this when sitting down.    Past Medical History:  Diagnosis Date   A-fib [facility])    Allergic rhinitis    Arthritis    Asthma    seasonal per pt   Bilateral knee pain    OA    CAD (coronary artery disease)    Diverticulosis 02/2012   seen on colonoscopy   Erectile dysfunction    GERD (gastroesophageal reflux disease)    Hiatal hernia    HLD (hyperlipidemia)    HTN (hypertension)    Hydronephrosis determined by ultrasound 08/22/2015   Overactive bladder    Prediabetes    Pulmonary lesion    Ringing of ears    Severe aortic stenosis    Sleep apnea    Urinary frequency    Urinary urgency    Wears glasses     Past Surgical History:  Procedure Laterality Date   ABDOMINAL AORTOGRAM W/LOWER EXTREMITY N/A 02/05/2023   Procedure: ABDOMINAL AORTOGRAM W/LOWER EXTREMITY;  Surgeon: [name], MD;  [facility]: [facility];  Service: Cardiovascular;  Laterality: N/A;   CATARACT EXTRACTION W/PHACO Left 12/07/2021   Procedure: CATARACT EXTRACTION PHACO AND INTRAOCULAR LENS PLACEMENT (IOC);  Surgeon: Fabio Pierce, MD;  Location: [facility];  Service: Ophthalmology;  Laterality: Left;  CDE 7.75   CATARACT EXTRACTION W/PHACO Right 12/21/2021   Procedure: CATARACT EXTRACTION PHACO AND INTRAOCULAR LENS PLACEMENT (IOC);  Surgeon: Fabio Pierce, MD;  Location: [facility];  Service: Ophthalmology;  Laterality: Right;  CDE: 8.00   CHOLECYSTECTOMY     COLONOSCOPY  02/2012   Dr. Arlyce Dice: sigmoid, transverse, and ascending colon diverticulosis   COLONOSCOPY WITH PROPOFOL N/A 01/13/2023   Procedure: COLONOSCOPY  Patient ID: Gary Carroll, male   DOB: [birth year], 77 y.o.   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  His most recent echo on 12/21/2022 showed a severely calcified and thickened aortic valve with a mean gradient of 41 mmHg and a valve area of 0.93 cm by VTI consistent with severe aortic stenosis.  Left ventricular ejection fraction was 60 to 65%.  He underwent cardiac catheterization on 02/05/2023 showing an 80% proximal LAD stenosis with mild to moderate disease elsewhere.  PA pressure was elevated at 57/21 with a mean of 30.  Cardiac index was 2.3.  He reports that he was feeling fairly well until about 3 months ago when he began noticing increasing shortness of breath with exertion while swimming.  Since then he has decreased his activity but noted worsening fatigue.  He denies dizziness and syncope.  He has chronic lower extremity edema.  His recently noted soreness when sitting down which led to a colonoscopy and detection of the rectum adenocarcinoma. He is having considerable pain from this when sitting down.    Past Medical History:  Diagnosis Date   A-fib [facility])    Allergic rhinitis    Arthritis    Asthma    seasonal per pt   Bilateral knee pain    OA    CAD (coronary artery disease)    Diverticulosis 02/2012   seen on colonoscopy   Erectile dysfunction    GERD (gastroesophageal reflux disease)    Hiatal hernia    HLD (hyperlipidemia)    HTN (hypertension)    Hydronephrosis determined by ultrasound 08/22/2015   Overactive bladder    Prediabetes    Pulmonary lesion    Ringing of ears    Severe aortic stenosis    Sleep apnea    Urinary frequency    Urinary urgency    Wears glasses     Past Surgical History:  Procedure Laterality Date   ABDOMINAL AORTOGRAM W/LOWER EXTREMITY N/A 02/05/2023   Procedure: ABDOMINAL AORTOGRAM W/LOWER EXTREMITY;  Surgeon: [name], MD;  [facility]: [facility];  Service: Cardiovascular;  Laterality: N/A;   CATARACT EXTRACTION W/PHACO Left 12/07/2021   Procedure: CATARACT EXTRACTION PHACO AND INTRAOCULAR LENS PLACEMENT (IOC);  Surgeon: Fabio Pierce, MD;  Location: [facility];  Service: Ophthalmology;  Laterality: Left;  CDE 7.75   CATARACT EXTRACTION W/PHACO Right 12/21/2021   Procedure: CATARACT EXTRACTION PHACO AND INTRAOCULAR LENS PLACEMENT (IOC);  Surgeon: Fabio Pierce, MD;  Location: [facility];  Service: Ophthalmology;  Laterality: Right;  CDE: 8.00   CHOLECYSTECTOMY     COLONOSCOPY  02/2012   Dr. Arlyce Dice: sigmoid, transverse, and ascending colon diverticulosis   COLONOSCOPY WITH PROPOFOL N/A 01/13/2023   Procedure: COLONOSCOPY  Patient ID: Gary Carroll, male   DOB: [birth year], 77 y.o.   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  His most recent echo on 12/21/2022 showed a severely calcified and thickened aortic valve with a mean gradient of 41 mmHg and a valve area of 0.93 cm by VTI consistent with severe aortic stenosis.  Left ventricular ejection fraction was 60 to 65%.  He underwent cardiac catheterization on 02/05/2023 showing an 80% proximal LAD stenosis with mild to moderate disease elsewhere.  PA pressure was elevated at 57/21 with a mean of 30.  Cardiac index was 2.3.  He reports that he was feeling fairly well until about 3 months ago when he began noticing increasing shortness of breath with exertion while swimming.  Since then he has decreased his activity but noted worsening fatigue.  He denies dizziness and syncope.  He has chronic lower extremity edema.  His recently noted soreness when sitting down which led to a colonoscopy and detection of the rectum adenocarcinoma. He is having considerable pain from this when sitting down.    Past Medical History:  Diagnosis Date   A-fib [facility])    Allergic rhinitis    Arthritis    Asthma    seasonal per pt   Bilateral knee pain    OA    CAD (coronary artery disease)    Diverticulosis 02/2012   seen on colonoscopy   Erectile dysfunction    GERD (gastroesophageal reflux disease)    Hiatal hernia    HLD (hyperlipidemia)    HTN (hypertension)    Hydronephrosis determined by ultrasound 08/22/2015   Overactive bladder    Prediabetes    Pulmonary lesion    Ringing of ears    Severe aortic stenosis    Sleep apnea    Urinary frequency    Urinary urgency    Wears glasses     Past Surgical History:  Procedure Laterality Date   ABDOMINAL AORTOGRAM W/LOWER EXTREMITY N/A 02/05/2023   Procedure: ABDOMINAL AORTOGRAM W/LOWER EXTREMITY;  Surgeon: [name], MD;  Location: [facility];  Service: Cardiovascular;  Laterality: N/A;   CATARACT EXTRACTION W/PHACO Left 12/07/2021   Procedure: CATARACT EXTRACTION PHACO AND INTRAOCULAR LENS PLACEMENT (IOC);  Surgeon: Fabio Pierce, MD;  Location: [facility];  Service: Ophthalmology;  Laterality: Left;  CDE 7.75   CATARACT EXTRACTION W/PHACO Right 12/21/2021   Procedure: CATARACT EXTRACTION PHACO AND INTRAOCULAR LENS PLACEMENT (IOC);  Surgeon: Fabio Pierce, MD;  Location: [facility];  Service: Ophthalmology;  Laterality: Right;  CDE: 8.00   CHOLECYSTECTOMY     COLONOSCOPY  02/2012   Dr. Arlyce Dice: sigmoid, transverse, and ascending colon diverticulosis   COLONOSCOPY WITH PROPOFOL N/A 01/13/2023   Procedure: COLONOSCOPY  ill-appearing  HEENT:  Unremarkable, NCAT, PERLA, EOMI  Neck:   no JVD, no bruits, no adenopathy   Chest:   clear to auscultation, symmetrical breath sounds, no wheezes, no rhonchi   CV:   RRR, 3/6 systolic murmur RSB, no diastolic murmur  Abdomen:  soft, non-tender, no masses   Extremities:  warm, well-perfused, pedal pulses not palpable, moderate bilateral lower extremity edema  Rectal/GU  Deferred  Neuro:   Grossly non-focal and symmetrical throughout  Skin:   Clean and dry, no rashes, no breakdown  Diagnostic Tests:  ECHOCARDIOGRAM REPORT       Patient Name:   Gary Carroll Date of Exam: 12/21/2022  Medical Rec #:  [id number]          Height:       69.0 in  Accession #:    [id number]         Weight:       265.0 lb  Date of Birth:  02-11-1946         BSA:          2.328 m  Patient Age:    77 years           BP:           145/72 mmHg  Patient Gender: M                  HR:           83 bpm.  [name]   Procedure: 2D Echo, Cardiac Doppler and Color  Doppler   Indications:    Congestive Heart Failure I50.9    History:        Patient has prior history of Echocardiogram examinations,  most                 recent 02/04/2021. COPD, Arrythmias:Atrial Fibrillation;  Risk                 Factors:Hypertension, Dyslipidemia and Current Smoker.  Acute                 respiratory failure with hypoxia (HCC), Morbid Obesity.    Sonographer:    Celesta Gentile RCS  Referring Phys: [id number] ASIA B ZIERLE-GHOSH   IMPRESSIONS     1. Left ventricular ejection fraction, by estimation, is 60 to 65%. The  left ventricle has normal function. The left ventricle has no regional  wall motion abnormalities. There is moderate left ventricular hypertrophy.  Left ventricular diastolic  parameters are indeterminate. Elevated left ventricular end-diastolic  pressure.   2. Right ventricular systolic function is normal. The right ventricular  size is normal. Tricuspid regurgitation signal is inadequate for assessing  PA pressure.   3. Left atrial size was moderately dilated.   4. The mitral valve is abnormal. No evidence of mitral valve  regurgitation. No evidence of mitral stenosis. Moderate to severe mitral  annular calcification.   5. The aortic valve is tricuspid. There is severe calcifcation of the  aortic valve. Aortic valve regurgitation is mild. Severe aortic valve  stenosis. Aortic valve area, by VTI measures 0.93 cm. Aortic valve mean  gradient measures 41.0 mmHg. Aortic  valve Vmax measures 4.19 m/s. DVI is 0.26.   6. The inferior vena cava is normal in size with <50% respiratory  variability, suggesting right atrial pressure of 8 mmHg.   7. Increased flow velocities may be secondary to anemia, thyrotoxicosis,  hyperdynamic or high flow state.

## 2023-02-20 ENCOUNTER — Encounter (HOSPITAL_COMMUNITY): Payer: Self-pay

## 2023-02-20 DIAGNOSIS — I5042 Chronic combined systolic (congestive) and diastolic (congestive) heart failure: Secondary | ICD-10-CM | POA: Diagnosis not present

## 2023-02-20 DIAGNOSIS — I480 Paroxysmal atrial fibrillation: Secondary | ICD-10-CM | POA: Diagnosis not present

## 2023-02-20 DIAGNOSIS — I350 Nonrheumatic aortic (valve) stenosis: Secondary | ICD-10-CM | POA: Diagnosis not present

## 2023-02-20 DIAGNOSIS — Z713 Dietary counseling and surveillance: Secondary | ICD-10-CM | POA: Diagnosis not present

## 2023-02-20 DIAGNOSIS — I10 Essential (primary) hypertension: Secondary | ICD-10-CM | POA: Diagnosis not present

## 2023-02-20 DIAGNOSIS — Z79899 Other long term (current) drug therapy: Secondary | ICD-10-CM | POA: Diagnosis not present

## 2023-02-20 DIAGNOSIS — I110 Hypertensive heart disease with heart failure: Secondary | ICD-10-CM | POA: Diagnosis not present

## 2023-02-20 DIAGNOSIS — Z1389 Encounter for screening for other disorder: Secondary | ICD-10-CM | POA: Diagnosis not present

## 2023-02-20 DIAGNOSIS — Z6835 Body mass index (BMI) 35.0-35.9, adult: Secondary | ICD-10-CM | POA: Diagnosis not present

## 2023-02-20 DIAGNOSIS — Z87891 Personal history of nicotine dependence: Secondary | ICD-10-CM | POA: Diagnosis not present

## 2023-02-21 ENCOUNTER — Other Ambulatory Visit (HOSPITAL_COMMUNITY): Payer: No Typology Code available for payment source

## 2023-02-21 ENCOUNTER — Encounter (HOSPITAL_COMMUNITY)
Admission: RE | Admit: 2023-02-21 | Discharge: 2023-02-21 | Disposition: A | Discharge status: Home / Self Care | Payer: No Typology Code available for payment source | Source: Ambulatory Visit | Attending: Cardiovascular Disease | Admitting: Cardiovascular Disease

## 2023-02-21 ENCOUNTER — Ambulatory Visit (HOSPITAL_COMMUNITY)
Admission: RE | Admit: 2023-02-21 | Discharge: 2023-02-21 | Disposition: A | Discharge status: Home / Self Care | Payer: No Typology Code available for payment source | Source: Ambulatory Visit | Attending: Cardiovascular Disease | Admitting: Cardiovascular Disease

## 2023-02-21 ENCOUNTER — Ambulatory Visit
Admission: RE | Admit: 2023-02-21 | Discharge: 2023-02-21 | Disposition: A | Discharge status: Home / Self Care | Payer: No Typology Code available for payment source | Source: Ambulatory Visit | Attending: Radiation Oncology | Admitting: Radiation Oncology

## 2023-02-21 ENCOUNTER — Other Ambulatory Visit: Payer: Self-pay | Admitting: Cardiology

## 2023-02-21 ENCOUNTER — Other Ambulatory Visit: Payer: Self-pay

## 2023-02-21 DIAGNOSIS — C20 Malignant neoplasm of rectum: Secondary | ICD-10-CM | POA: Insufficient documentation

## 2023-02-21 DIAGNOSIS — I350 Nonrheumatic aortic (valve) stenosis: Secondary | ICD-10-CM | POA: Diagnosis not present

## 2023-02-21 DIAGNOSIS — Z1152 Encounter for screening for COVID-19: Secondary | ICD-10-CM | POA: Diagnosis not present

## 2023-02-21 DIAGNOSIS — Z01810 Encounter for preprocedural cardiovascular examination: Secondary | ICD-10-CM | POA: Diagnosis present

## 2023-02-21 DIAGNOSIS — Z01812 Encounter for preprocedural laboratory examination: Secondary | ICD-10-CM | POA: Diagnosis present

## 2023-02-21 DIAGNOSIS — Z510 Encounter for antineoplastic radiation therapy: Secondary | ICD-10-CM | POA: Diagnosis present

## 2023-02-21 DIAGNOSIS — R9431 Abnormal electrocardiogram [ECG] [EKG]: Secondary | ICD-10-CM | POA: Diagnosis not present

## 2023-02-21 DIAGNOSIS — R590 Localized enlarged lymph nodes: Secondary | ICD-10-CM | POA: Diagnosis not present

## 2023-02-21 DIAGNOSIS — Z01818 Encounter for other preprocedural examination: Secondary | ICD-10-CM | POA: Diagnosis not present

## 2023-02-21 HISTORY — DX: Type 2 diabetes mellitus without complications: E11.9

## 2023-02-21 LAB — URINALYSIS, ROUTINE W REFLEX MICROSCOPIC
Bilirubin Urine: NEGATIVE
Glucose, UA: NEGATIVE mg/dL
Hgb urine dipstick: NEGATIVE
Ketones, ur: NEGATIVE mg/dL
Nitrite: NEGATIVE
Protein, ur: NEGATIVE mg/dL
Specific Gravity, Urine: 1.02 (ref 1.005–1.030)
WBC, UA: >50 WBC/hpf (ref 0–5)
pH: 5 (ref 5.0–8.0)

## 2023-02-21 LAB — COMPREHENSIVE METABOLIC PANEL
ALT: 35 U/L (ref 0–44)
AST: 31 U/L (ref 15–41)
Albumin: 2.9 g/dL — ABNORMAL LOW (ref 3.5–5.0)
Alkaline Phosphatase: 66 U/L (ref 38–126)
Anion gap: 8 (ref 5–15)
BUN: 20 mg/dL (ref 8–23)
CO2: 22 mmol/L (ref 22–32)
Calcium: 8.7 mg/dL — ABNORMAL LOW (ref 8.9–10.3)
Chloride: 112 mmol/L — ABNORMAL HIGH (ref 98–111)
Creatinine, Ser: 0.89 mg/dL (ref 0.61–1.24)
GFR, Estimated: >60 mL/min (ref >60)
Glucose, Bld: 113 mg/dL — ABNORMAL HIGH (ref 70–99)
Potassium: 3.9 mmol/L (ref 3.5–5.1)
Sodium: 142 mmol/L (ref 135–145)
Total Bilirubin: 0.7 mg/dL (ref 0.3–1.2)
Total Protein: 6.7 g/dL (ref 6.5–8.1)

## 2023-02-21 LAB — TYPE AND SCREEN
ABO/RH(D): A NEG
Antibody Screen: NEGATIVE

## 2023-02-21 LAB — CBC
HCT: 36.6 % — ABNORMAL LOW (ref 39.0–52.0)
Hemoglobin: 11 g/dL — ABNORMAL LOW (ref 13.0–17.0)
MCH: 26.9 pg (ref 26.0–34.0)
MCHC: 30.1 g/dL (ref 30.0–36.0)
MCV: 89.5 fL (ref 80.0–100.0)
Platelets: 262 10*3/uL (ref 150–400)
RBC: 4.09 MIL/uL — ABNORMAL LOW (ref 4.22–5.81)
RDW: 16.2 % — ABNORMAL HIGH (ref 11.5–15.5)
WBC: 7.1 10*3/uL (ref 4.0–10.5)
nRBC: 0 % (ref 0.0–0.2)

## 2023-02-21 LAB — SURGICAL PCR SCREEN
MRSA, PCR: NEGATIVE
Staphylococcus aureus: NEGATIVE

## 2023-02-21 LAB — SARS CORONAVIRUS 2 (TAT 6-24 HRS): SARS Coronavirus 2: NEGATIVE

## 2023-02-21 LAB — PROTIME-INR
INR: 1.3 — ABNORMAL HIGH (ref 0.8–1.2)
Prothrombin Time: 16 s — ABNORMAL HIGH (ref 11.4–15.2)

## 2023-02-21 MED ORDER — NITROFURANTOIN MONOHYD MACRO 100 MG PO CAPS: 100.0000 mg | ORAL_CAPSULE | Freq: Two times a day (BID) | ORAL | 0 refills | Status: DC

## 2023-02-21 NOTE — Progress Notes (Addendum)
Patient signed all consents at PAT lab appointment. CHG soap and instructions were given to patient. CHG surgical prep reviewed with patient and all questions answered.  Patients chart send to anesthesia for review.  Julieta Gutting, RN with TAVR team made aware of EKG results. No new orders given.

## 2023-02-21 NOTE — Progress Notes (Signed)
Patient underwent PAT testing for upcoming TAVR. UA returned with evidence of UTI despite no symptoms on call. Patient self caths. Plan to treated with Macrobid 100mg  BID x 5 days. Patient agreeable and will plan to pick up RX tomorrow morning.   Georgie Chard NP-C Structural Heart Team  Pager: [phone] Phone: [phone]

## 2023-02-22 DIAGNOSIS — J9601 Acute respiratory failure with hypoxia: Secondary | ICD-10-CM | POA: Diagnosis not present

## 2023-02-22 DIAGNOSIS — R0602 Shortness of breath: Secondary | ICD-10-CM | POA: Diagnosis not present

## 2023-02-24 MED ORDER — MAGNESIUM SULFATE 50 % IJ SOLN
40.0000 meq | INTRAMUSCULAR | Status: DC
  Filled 2023-02-24 (×2): qty 9.85

## 2023-02-24 MED ORDER — HEPARIN 30,000 UNITS/1000 ML (OHS) CELLSAVER SOLUTION
Status: DC
  Filled 2023-02-24 (×2): qty 1000

## 2023-02-24 MED ORDER — NOREPINEPHRINE 4 MG/250ML-% IV SOLN
0.0000 ug/min | INTRAVENOUS | Status: AC
  Administered 2023-02-25: 2 ug/min via INTRAVENOUS
  Filled 2023-02-24: qty 250

## 2023-02-24 MED ORDER — DEXMEDETOMIDINE HCL IN NACL 400 MCG/100ML IV SOLN
0.1000 ug/kg/h | INTRAVENOUS | Status: AC
  Administered 2023-02-25: 59.92 ug via INTRAVENOUS
  Administered 2023-02-25: 29 ug via INTRAVENOUS
  Administered 2023-02-25: .7 ug/kg/h via INTRAVENOUS
  Filled 2023-02-24: qty 100

## 2023-02-24 MED ORDER — CEFAZOLIN SODIUM-DEXTROSE 2-4 GM/100ML-% IV SOLN
2.0000 g | INTRAVENOUS | Status: AC
  Administered 2023-02-25: 2 g via INTRAVENOUS
  Filled 2023-02-24: qty 100

## 2023-02-24 MED ORDER — POTASSIUM CHLORIDE 2 MEQ/ML IV SOLN
80.0000 meq | INTRAVENOUS | Status: DC
  Filled 2023-02-24 (×2): qty 40

## 2023-02-24 NOTE — H&P (Signed)
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  His most recent echo on 12/21/2022 showed a severely calcified and thickened aortic valve with a mean gradient of 41 mmHg and a valve area of 0.93 cm by VTI consistent with severe aortic stenosis.  Left ventricular ejection fraction was 60 to 65%.  He underwent cardiac catheterization on 02/05/2023 showing an 80% proximal LAD stenosis with mild to moderate disease elsewhere.  PA pressure was elevated at 57/21 with a mean of 30.  Cardiac index was 2.3.   He reports that he was feeling fairly well until about 3 months ago when he began noticing increasing shortness of breath with exertion while swimming.  Since then he has decreased his activity but noted worsening fatigue.  He denies dizziness and syncope.  He has chronic lower extremity edema.  His recently noted soreness when sitting down which led to a colonoscopy and detection of the rectum adenocarcinoma. He is having considerable pain from this when sitting down.           Past Medical History:  Diagnosis Date   A-fib [facility])     Allergic rhinitis     Arthritis     Asthma      seasonal per pt   Bilateral knee pain      OA   CAD (coronary artery disease)     Diverticulosis 02/2012    seen on colonoscopy   Erectile dysfunction     GERD  (gastroesophageal reflux disease)     Hiatal hernia     HLD (hyperlipidemia)     HTN (hypertension)     Hydronephrosis determined by ultrasound 08/22/2015   Overactive bladder     Prediabetes     Pulmonary lesion     Ringing of ears     Severe aortic stenosis     Sleep apnea     Urinary frequency     Urinary urgency     Wears glasses                 Past Surgical History:  Procedure Laterality Date   ABDOMINAL AORTOGRAM W/LOWER EXTREMITY N/A 02/05/2023    Procedure: ABDOMINAL AORTOGRAM W/LOWER EXTREMITY;  Surgeon: [name], MD;  Location: [facility];  Service: Cardiovascular;  Laterality: N/A;   CATARACT EXTRACTION W/PHACO Left 12/07/2021    Procedure: CATARACT EXTRACTION PHACO AND INTRAOCULAR LENS PLACEMENT (IOC);  Surgeon: Fabio Pierce, MD;  Location: [facility] ORS;  Service: Ophthalmology;  Laterality: Left;  CDE 7.75   CATARACT EXTRACTION W/PHACO Right 12/21/2021    Procedure: CATARACT EXTRACTION PHACO AND INTRAOCULAR LENS PLACEMENT (IOC);  Surgeon: Fabio Pierce, MD;  Location: [facility] OR[facility];  Service: Ophthalmology;  Laterality: Right;  CDE: 8.00   CHOLECYSTECTOMY       COLONOSCOPY   02/2012  Dr. Arlyce Dice: sigmoid, transverse, and ascending colon diverticulosis   COLONOSCOPY WITH PROPOFOL N/A 01/13/2023    Procedure: COLONOSCOPY WITH PROPOFOL;  Surgeon: Lanelle Bal, DO;  Location: [facility];  Service: Endoscopy;  Laterality: N/A;  1:00 pm, asa 3   FINGER SURGERY Right     prostate procedure   2017    per patient, was having trouble urinating    RIGHT HEART CATH AND CORONARY ANGIOGRAPHY N/A 02/05/2023    Procedure: RIGHT HEART CATH AND CORONARY ANGIOGRAPHY;  Surgeon: [name], MD;  Location: [facility];  Service: Cardiovascular;  Laterality: N/A;   TONSILLECTOMY       TOTAL KNEE ARTHROPLASTY   2012    left (Dr. 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/19/2015    Procedure: TOTAL KNEE ARTHROPLASTY;  Surgeon: Dannielle Huh, MD;  Location: [facility] OR;   Service: Orthopedics;  Laterality: Right;               Family History  Problem Relation Age of Onset   Uterine cancer Mother     Cancer Mother          uterine   Coronary artery disease Father     Coronary artery disease Paternal Grandfather     Heart disease Paternal Grandfather     Colon cancer Neg Hx     Esophageal cancer Neg Hx     Stomach cancer Neg Hx     Rectal cancer Neg Hx     Liver disease Neg Hx            Social History         Socioeconomic History   Marital status: Divorced      Spouse name: Not on file   Number of children: 0   Years of education: Not on file   Highest education level: Not on file  Occupational History   Occupation: retired Lobbyist)      Employer: RETIRED  Tobacco Use   Smoking status: Former      Current packs/day: 0.00      Types: Cigarettes      Quit date: 01/18/2010      Years since quitting: 13.0   Smokeless tobacco: Never   Tobacco comments:      Smoked for 25 years- quit September 2011.  Passive exposure from wife  Vaping Use   Vaping status: Never Used  Substance and Sexual Activity   Alcohol use: Not Currently      Alcohol/week: 1.0 - 2.0 standard drink of alcohol      Types: 1 - 2 Glasses of wine per week   Drug use: No   Sexual activity: Not on file  Other Topics Concern   Not on file  Social History Narrative    Separated from wife (2014) and moved to [place]. Retired- worked in Production designer, theatre/television/film.     Social Determinants of Health        Financial Resource Strain: Not on file  Food Insecurity: No Food Insecurity (02/05/2023)    Hunger Vital Sign     Worried About Running Out of Food in the Last Year: Never true     Ran Out of Food in the Last Year: Never true  Transportation Needs: No Transportation Needs (02/05/2023)    PRAPARE - Therapist, art (Medical): No     Lack of Transportation (Non-Medical): No  Physical Activity: Not on file  Stress: Not on file  Social Connections:  Not on file  Intimate Partner Violence: Not At Risk (02/05/2023)    Humiliation, Afraid, Rape, and Kick questionnaire     Fear of Current or Ex-Partner: No     Emotionally Abused: No     Physically Abused: No     Sexually Abused: No             Prior to Admission medications   Medication Sig Start Date End Date Taking? Authorizing Provider  acetaminophen (TYLENOL) 650 MG CR tablet Take 1,300 mg by mouth every 8 (eight) hours as needed for pain.     Yes [provider]  apixaban (ELIQUIS) 5 MG TABS tablet Take 1 tablet (5 mg total) by mouth 2 (two) times daily. 08/24/15   Yes Philip Aspen, Limmie Patricia, MD  diltiazem (CARDIZEM CD) 120 MG 24 hr capsule Take 1 capsule (120 mg total) by mouth daily. 01/18/23   Yes Dione Booze, MD  dutasteride (AVODART) 0.5 MG capsule Take 0.5 mg by mouth daily.     Yes [provider]  furosemide (LASIX) 40 MG tablet Take 1 tablet (40 mg total) by mouth 2 (two) times daily. You can take an additional 40mg  of Lasix as needed for shortness of breath or swelling Patient taking differently: Take 40 mg by mouth daily. 02/06/23   Yes Janetta Hora, PA-C  losartan (COZAAR) 50 MG tablet Take 0.5 tablets (25 mg total) by mouth daily. Patient taking differently: Take 50 mg by mouth daily. 12/23/22   Yes Vassie Loll, MD  Magnesium 500 MG CAPS Take 500 mg by mouth daily.     Yes [provider]  Melatonin 10 MG CAPS Take 10 mg by mouth at bedtime.     Yes [provider]  metFORMIN (GLUCOPHAGE) 500 MG tablet Take 500 mg by mouth 2 (two) times daily with a meal.     Yes [provider]  metoprolol tartrate (LOPRESSOR) 25 MG tablet Take 25-50 mg by mouth See admin instructions. Take 50 mg in the morning and 25 mg in the evening     Yes [provider]  metoprolol tartrate (LOPRESSOR) 50 MG tablet Take 1 tablet (50 mg total) by mouth 2 (two) times daily. 01/18/23   Yes Dione Booze, MD  Multiple Vitamins-Minerals  (MULTIVITAL) tablet Take 1 tablet by mouth daily.     Yes [provider]  Omega-3 Fatty Acids (FISH OIL) 1000 MG CAPS Take 2,000 mg by mouth daily.     Yes [provider]  oxybutynin (DITROPAN) 5 MG tablet Take 5 mg by mouth every evening. 06/01/19   Yes [provider]  pantoprazole (PROTONIX) 40 MG tablet Take 40 mg by mouth daily.     Yes [provider]  potassium chloride (KLOR-CON) 10 MEQ tablet Take 1 tablet (10 mEq total) by mouth daily. 02/06/23   Yes Janetta Hora, PA-C  rosuvastatin (CRESTOR) 20 MG tablet Take 20 mg by mouth daily.     Yes [provider]  vitamin C (ASCORBIC ACID) 500 MG tablet Take 500 mg by mouth daily.     Yes [provider]            Current Outpatient Medications  Medication Sig Dispense Refill   acetaminophen (TYLENOL) 650 MG CR tablet Take 1,300 mg by mouth every 8 (eight) hours as needed for pain.       apixaban (ELIQUIS) 5 MG TABS tablet Take 1 tablet (5 mg total) by mouth 2 (two) times daily.  60 tablet 2   diltiazem (CARDIZEM CD) 120 MG 24 hr capsule Take 1 capsule (120 mg total) by mouth daily. 30 capsule 0   dutasteride (AVODART) 0.5 MG capsule Take 0.5 mg by mouth daily.       furosemide (LASIX) 40 MG tablet Take 1 tablet (40 mg total) by mouth 2 (two) times daily. You can take an additional 40mg  of Lasix as needed for shortness of breath or swelling (Patient taking differently: Take 40 mg by mouth daily.) 60 tablet 2   losartan (COZAAR) 50 MG tablet Take 0.5 tablets (25 mg total) by mouth daily. (Patient taking differently: Take 50 mg by mouth daily.)       Magnesium 500 MG CAPS Take 500 mg by mouth daily.       Melatonin 10 MG CAPS Take 10 mg by mouth at bedtime.       metFORMIN (GLUCOPHAGE) 500 MG tablet Take 500 mg by mouth 2 (two) times daily with a meal.       metoprolol tartrate (LOPRESSOR) 25 MG tablet Take 25-50 mg by mouth See admin instructions. Take 50 mg in the morning and 25 mg  in the evening       metoprolol tartrate (LOPRESSOR) 50 MG tablet Take 1 tablet (50 mg total) by mouth 2 (two) times daily. 60 tablet 0   Multiple Vitamins-Minerals (MULTIVITAL) tablet Take 1 tablet by mouth daily.       Omega-3 Fatty Acids (FISH OIL) 1000 MG CAPS Take 2,000 mg by mouth daily.       oxybutynin (DITROPAN) 5 MG tablet Take 5 mg by mouth every evening.       pantoprazole (PROTONIX) 40 MG tablet Take 40 mg by mouth daily.       potassium chloride (KLOR-CON) 10 MEQ tablet Take 1 tablet (10 mEq total) by mouth daily. 60 tablet 3   rosuvastatin (CRESTOR) 20 MG tablet Take 20 mg by mouth daily.       vitamin C (ASCORBIC ACID) 500 MG tablet Take 500 mg by mouth daily.          No current facility-administered medications for this visit.        Allergies       Allergies  Allergen Reactions   Diovan [Valsartan] Cough   Tagamet [Cimetidine] Other (See Comments)      irrittability            Review of Systems:               General:                      + decreased appetite, + marked decreased energy, no weight gain, + weight loss, no fever             Cardiac:                       no chest pain with exertion, no chest pain at rest, +SOB with mild exertion, no resting SOB, no PND, no orthopnea, no palpitations, + arrhythmia, + atrial fibrillation, + LE edema, no dizzy spells, no syncope             Respiratory:                 + exertional shortness of breath, no home oxygen, no productive cough, no dry cough, no bronchitis, no wheezing, no hemoptysis, no asthma, no pain with inspiration or cough, + sleep  apnea, no CPAP at night             GI:                               no difficulty swallowing, no reflux, no frequent heartburn, no hiatal hernia, no abdominal pain, no constipation, no diarrhea, no hematochezia, no hematemesis, no melena             GU:                              no dysuria,  no frequency, no urinary tract infection, no hematuria, + enlarged prostate, no  kidney stones, no kidney disease             Vascular:                     no pain suggestive of claudication, no pain in feet, no leg cramps, no varicose veins, no DVT, no non-healing foot ulcer             Neuro:                         no stroke, no TIA's, no seizures, no headaches, no temporary blindness one eye,  no slurred speech, no peripheral neuropathy, no chronic pain, + instability of gait, no memory/cognitive dysfunction             Musculoskeletal:         + arthritis - primarily involving the knees, + joint swelling, no myalgias, + difficulty walking, + decreased mobility              Skin:                            no rash, no itching, no skin infections, no pressure sores or ulcerations             Psych:                         no anxiety, no depression, no nervousness, no unusual recent stress             Eyes:                           no blurry vision, + floaters, + recent vision changes, + wears glasses              ENT:                            no hearing loss, no loose or painful teeth, no dentures, last saw dentist yearly             Hematologic:               no easy bruising, no abnormal bleeding, no clotting disorder, no frequent epistaxis             Endocrine:                   + diabetes, does check CBG's at home  Physical Exam:               BP 120/76 (BP Location: Right Arm, Patient Position: Sitting)   Pulse (!) 114   Resp 18   Ht 5\' 10"  (1.778 m)   Wt 264 lb (119.7 kg)   SpO2 96% Comment: RA  BMI 37.88 kg/m              General:                      Chronically ill-appearing             HEENT:                       Unremarkable, NCAT, PERLA, EOMI             Neck:                           no JVD, no bruits, no adenopathy              Chest:                          clear to auscultation, symmetrical breath sounds, no wheezes, no rhonchi              CV:                              RRR, 3/6 systolic murmur RSB, no diastolic  murmur             Abdomen:                    soft, non-tender, no masses              Extremities:                 warm, well-perfused, pedal pulses not palpable, moderate bilateral lower extremity edema             Rectal/GU                   Deferred             Neuro:                         Grossly non-focal and symmetrical throughout             Skin:                            Clean and dry, no rashes, no breakdown   Diagnostic Tests:   ECHOCARDIOGRAM REPORT       Patient Name:   TORREN FALSETTI Date of Exam: 12/21/2022  Medical Rec #:  [id number]          Height:       69.0 in  Accession #:    [id number]         Weight:       265.0 lb  Date of Birth:  May 10, 1946         BSA:          2.328 m  Patient Age:    76 years           BP:  145/72 mmHg  Patient Gender: M                  HR:           83 bpm.  [name]   Procedure: 2D Echo, Cardiac Doppler and Color Doppler   Indications:    Congestive Heart Failure I50.9    History:        Patient has prior history of Echocardiogram examinations,  most                 recent 02/04/2021. COPD, Arrythmias:Atrial Fibrillation;  Risk                 Factors:Hypertension, Dyslipidemia and Current Smoker.  Acute                 respiratory failure with hypoxia (HCC), Morbid Obesity.    Sonographer:    Celesta Gentile RCS  Referring Phys: [id number] ASIA B ZIERLE-GHOSH   IMPRESSIONS     1. Left ventricular ejection fraction, by estimation, is 60 to 65%. The  left ventricle has normal function. The left ventricle has no regional  wall motion abnormalities. There is moderate left ventricular hypertrophy.  Left ventricular diastolic  parameters are indeterminate. Elevated left ventricular end-diastolic  pressure.   2. Right ventricular systolic function is normal. The right ventricular  size is normal. Tricuspid regurgitation signal is inadequate for assessing  PA pressure.   3. Left atrial size was  moderately dilated.   4. The mitral valve is abnormal. No evidence of mitral valve  regurgitation. No evidence of mitral stenosis. Moderate to severe mitral  annular calcification.   5. The aortic valve is tricuspid. There is severe calcifcation of the  aortic valve. Aortic valve regurgitation is mild. Severe aortic valve  stenosis. Aortic valve area, by VTI measures 0.93 cm. Aortic valve mean  gradient measures 41.0 mmHg. Aortic  valve Vmax measures 4.19 m/s. DVI is 0.26.   6. The inferior vena cava is normal in size with <50% respiratory  variability, suggesting right atrial pressure of 8 mmHg.   7. Increased flow velocities may be secondary to anemia, thyrotoxicosis,  hyperdynamic or high flow state.   Comparison(s): Changes from prior study are noted. AS progressed from  moderate in 2022 to severe now.   FINDINGS   Left Ventricle: Left ventricular ejection fraction, by estimation, is 60  to 65%. The left ventricle has normal function. The left ventricle has no  regional wall motion abnormalities. Definity contrast agent was given IV  to delineate the left ventricular   endocardial borders. The left ventricular internal cavity size was normal  in size. There is moderate left ventricular hypertrophy. Left ventricular  diastolic parameters are indeterminate. Elevated left ventricular  end-diastolic pressure.   Right Ventricle: The right ventricular size is normal. No increase in  right ventricular wall thickness. Right ventricular systolic function is  normal. Tricuspid regurgitation signal is inadequate for assessing PA  pressure.   Left Atrium: Left atrial size was moderately dilated.   Right Atrium: Right atrial size was normal in size.   Pericardium: There is no evidence of pericardial effusion.   Mitral Valve: The mitral valve is abnormal. Moderate to severe mitral  annular calcification. No evidence of mitral valve regurgitation. No  evidence of mitral valve stenosis.    Tricuspid Valve: The tricuspid valve is normal in structure. Tricuspid  valve regurgitation is not demonstrated. No evidence of tricuspid  stenosis.  Aortic Valve: The aortic valve is tricuspid. There is severe calcifcation  of the aortic valve. Aortic valve regurgitation is mild. Aortic  regurgitation PHT measures 351 msec. Severe aortic stenosis is present.  Aortic valve mean gradient measures 41.0  mmHg. Aortic valve peak gradient measures 70.2 mmHg. Aortic valve area, by  VTI measures 0.93 cm.   Pulmonic Valve: The pulmonic valve was normal in structure. Pulmonic valve  regurgitation is not visualized. No evidence of pulmonic stenosis.   Aorta: The aortic root is normal in size and structure.   Venous: The inferior vena cava is normal in size with less than 50%  respiratory variability, suggesting right atrial pressure of 8 mmHg.   IAS/Shunts: No atrial level shunt detected by color flow Doppler.     LEFT VENTRICLE  PLAX 2D  LVIDd:         5.10 cm   Diastology  LVIDs:         2.70 cm   LV e' medial:    5.66 cm/s  LV PW:         1.40 cm   LV E/e' medial:  20.1  LV IVS:        1.40 cm   LV e' lateral:   7.51 cm/s  LVOT diam:     1.90 cm   LV E/e' lateral: 15.2  LV SV:         92  LV SV Index:   40  LVOT Area:     2.84 cm     RIGHT VENTRICLE  RV S prime:     16.50 cm/s  TAPSE (M-mode): 3.1 cm   LEFT ATRIUM              Index        RIGHT ATRIUM           Index  LA diam:        4.50 cm  1.93 cm/m   RA Area:     18.20 cm  LA Vol (A2C):   72.0 ml  30.92 ml/m  RA Volume:   53.20 ml  22.85 ml/m  LA Vol (A4C):   107.0 ml 45.95 ml/m  LA Biplane Vol: 96.2 ml  41.31 ml/m   AORTIC VALVE  AV Area (Vmax):    0.72 cm  AV Area (Vmean):   0.78 cm  AV Area (VTI):     0.93 cm  AV Vmax:           419.00 cm/s  AV Vmean:          281.250 cm/s  AV VTI:            0.993 m  AV Peak Grad:      70.2 mmHg  AV Mean Grad:      41.0 mmHg  LVOT Vmax:         107.00 cm/s  LVOT  Vmean:        77.800 cm/s  LVOT VTI:          0.325 m  LVOT/AV VTI ratio: 0.33  AI PHT:            351 msec    AORTA  Ao Root diam: 3.20 cm   MITRAL VALVE  MV Area (PHT): 3.37 cm     SHUNTS  MV Decel Time: 225 msec     Systemic VTI:  0.32 m  MV E velocity: 114.00 cm/s  Systemic Diam: 1.90 cm  MV A velocity: 94.60 cm/s  MV E/A ratio:  1.21   Vishnu Priya Mallipeddi  Electronically signed by Winfield Rast Mallipeddi  Signature Date/Time: 12/21/2022/12:56:34 PM        Final        hysicians   Panel Physicians Referring Physician Case Authorizing Physician  [name], MD (Primary)        Procedures   ABDOMINAL AORTOGRAM W/LOWER EXTREMITY  RIGHT HEART CATH AND CORONARY ANGIOGRAPHY    Conclusion       3rd Mrg-1 lesion is 70% stenosed.   3rd Mrg-2 lesion is 70% stenosed.   Prox LAD lesion is 80% stenosed.   1.  High-grade proximal LAD lesion with mild to moderate disease elsewhere; given the lack of exertional angina medical therapy will be pursued. 2.  Fick cardiac output of 5.3 L/min and Fick cardiac index of 2.3 L/min/m with the following hemodynamics:             Right atrial pressure mean of 10 mmHg             Right ventricular pressure 53/-1 with an end-diastolic pressure of 14 mmHg             Wedge pressure mean of 26 mmHg             Pulmonary artery pressure 57/21 with a mean of 30 mmHg             Pulmonary vascular resistance of 0.75 Woods units             Pulmonary artery pulsatility index of 3.6 3.  Capacious iliofemoral vessels bilaterally.   Recommendation: Increase Lasix to 40 mg twice daily and continue evaluation for aortic valve intervention.     Indications   Nonrheumatic aortic valve stenosis [I35.0 (ICD-10-CM)]    Procedural Details   Technical Details The patient is a 77 year old male with a history of paroxysmal atrial fibrillation on Eliquis, obstructive sleep apnea on CPAP, hyperlipidemia, hypertension, type 2 diabetes, BMI of  37, and severe symptomatic aortic stenosis who was seen in the outpatient setting due to ongoing dyspnea.  He was referred for right heart catheterization and coronary angiography study as well as a peripheral angiography study to evaluate his candidacy for aortic valve intervention.  After obtaining consent the patient brought the cardiac catheterization laboratory and prepped the sterile fashion.  Xylocaine is used anesthetize right wrist and a 6 French Hemovac sheath placed there.  5000 units heparin 5 mg of verapamil were ministered through the sheath.  An antecubital IV was exchanged for a 5 [place] Terumo glide sheath.  Right heart catheterization was done through this antecubital sheath.  A 6 [place] Tig catheter was used for selective coronary angiography.  5 [place] balloontipped catheter was used for right heart catheterization, 6 French pigtail catheter was used for peripheral angiography.  For this last procedure, a 6 French pigtail catheter was maneuvered to the distal abdominal aorta and aortography and bilateral iliofemoral angiography was performed.  A TR band was placed and manual pressure applied to the antecubital site.  There were no acute complications. Estimated blood loss <50 mL.   During this procedure medications were administered to achieve and maintain moderate conscious sedation while the patient's heart rate, blood pressure, and oxygen saturation were continuously monitored and I was present face-to-face 100% of this time.    Medications (Filter: Administrations occurring from 1624 to 1710 on 02/05/23) Heparin (Porcine) in NaCl 1000-0.9 UT/500ML-% SOLN (mL)  Total volume: 1,000 mL Date/Time Rate/Dose/Volume  Action    02/05/23 1626 500 mL Given    1626 500 mL Given    fentaNYL (SUBLIMAZE) injection (mcg)  Total dose: 25 mcg Date/Time Rate/Dose/Volume Action    02/05/23 1639 25 mcg Given    midazolam (VERSED) injection (mg)  Total dose: 1 mg Date/Time Rate/Dose/Volume  Action    02/05/23 1640 1 mg Given    Radial Cocktail/Verapamil only (mL)  Total volume: 10 mL Date/Time Rate/Dose/Volume Action    02/05/23 1646 10 mL Given    heparin sodium (porcine) injection (Units)  Total dose: 5,000 Units Date/Time Rate/Dose/Volume Action    02/05/23 1646 5,000 Units Given    iohexol (OMNIPAQUE) 350 MG/ML injection (mL)  Total volume: 70 mL Date/Time Rate/Dose/Volume Action    02/05/23 1706 70 mL Given    lidocaine (PF) (XYLOCAINE) 1 % injection (mL)  Total volume: 2 mL Date/Time Rate/Dose/Volume Action    02/05/23 1644 2 mL Given      Sedation Time   Sedation Time Physician-1: 20 minutes 49 seconds Contrast        Administrations occurring from 1624 to 1710 on 02/05/23:  Medication Name Total Dose  iohexol (OMNIPAQUE) 350 MG/ML injection 70 mL    Radiation/Fluoro   Fluoro time: 3.2 (min) DAP: 20700 (mGycm2) Cumulative Air Kerma: 250 (mGy) Coronary Findings   Diagnostic Dominance: Right Left Anterior Descending  The vessel exhibits minimal luminal irregularities.  Prox LAD lesion is 80% stenosed.    Left Circumflex    Third Obtuse Marginal Branch  3rd Mrg-1 lesion is 70% stenosed.  3rd Mrg-2 lesion is 70% stenosed.    Right Coronary Artery  There is mild diffuse disease throughout the vessel.    Intervention    No interventions have been documented.    Coronary Diagrams   Diagnostic Dominance: Right  Intervention    Implants    No implant documentation for this case.    Syngo Images    Show images for CARDIAC CATHETERIZATION Images on Long Term Storage    Show images for Shiller, HARUKI BURDICK to Procedure Log   Procedure Log    Link to Procedure Log   Procedure Log    Hemo Data   Flowsheet Row Most Recent Value  Fick Cardiac Output 5.33 L/min  Fick Cardiac Output Index 2.26 (L/min)/BSA  RA A Wave 12 mmHg  RA V Wave 13 mmHg  RA Mean 10 mmHg  RV Systolic Pressure 53 mmHg  RV Diastolic Pressure -1 mmHg   RV EDP 14 mmHg  PA Systolic Pressure 57 mmHg  PA Diastolic Pressure 21 mmHg  PA Mean 30 mmHg  PW A Wave 29 mmHg  PW V Wave 41 mmHg  PW Mean 26 mmHg  AO Systolic Pressure 118 mmHg  AO Diastolic Pressure 48 mmHg  AO Mean 76 mmHg  QP/QS 1  TPVR Index 13.28 HRUI  TSVR Index 33.61 HRUI  PVR SVR Ratio 0.06  TPVR/TSVR Ratio 0.39    Narrative & Impression  CLINICAL DATA:  82M with severe aortic stenosis being evaluated for a TAVR procedure.   EXAM: Cardiac TAVR CT   TECHNIQUE: The patient was scanned on a [employer]. A 120 kV retrospective scan was triggered in the descending thoracic aorta at 111 HU's. Gantry rotation speed was 250 msecs and collimation was .6 mm. No beta blockade or nitro were given. The 3D data set was reconstructed in 5% intervals of the R-R cycle. Systolic and diastolic phases were analyzed on a dedicated work  station using MPR, MIP and VRT modes. The patient received 100 cc of contrast.   FINDINGS: Aortic Root:   Aortic valve: Tricuspid   Aortic valve calcium score: 1634   Aortic annulus:   Diameter: 28mm x 21mm   Perimeter: 79mm   Area: 471 mm^2   Calcifications: No calcifications   Coronary height: Min Left - 17mm; Min Right - 18mm   Sinotubular height: Left cusp - 24mm; Right cusp - 23mm; Noncoronary cusp - 24mm   LVOT (as measured 3 mm below the annulus):   Diameter: 29mm x 22mm   Area: 483 mm^2   Calcifications: Mild calcification inferior to left coronary cusp   Aortic sinus width: Left cusp - 32mm; Right cusp - 29mm; Noncoronary cusp - 33mm   Sinotubular junction width: 30mm x 27mm   Optimum Fluoroscopic Angle for Delivery: RAO 4 CRA 7   Cardiac:   Right atrium: Mild enlargement   Right ventricle: Mild dilatation   Pulmonary arteries: Normal size   Pulmonary veins: Normal configuration   Left atrium: Moderate enlargement   Left ventricle: Normal size   Pericardium: Normal thickness   Coronary  arteries: Normal origin. Coronary calcium score 2719 (91st percentile)   IMPRESSION: 1. Trileaflet aortic valve with moderate calcifications (AV calcium score 1634)   2. Aortic annulus measures 28mm x 21mm in diameter with perimeter 79mm and area 471 mm^2. No annular calcifications, but mild LVOT calcification inferior to the left coronary cusp. Annular measurements are suitable for delivery of 26mm Edwards Sapien 3 valve.   3.  Sufficient coronary to annulus distance.   4.  Optimum Fluoroscopic Angle for Delivery:  RAO 4 CRA 7   5.  Coronary calcium score 2719 (91st percentile)     Electronically Signed   By: Epifanio Lesches M.D.   On: 02/18/2023 14:54        Impression:   This 77 year old gentleman has stage D, severe, symptomatic aortic stenosis with NYHA class III symptoms of exertional fatigue and shortness of breath consistent with chronic diastolic congestive heart failure.  I have personally reviewed his 2D echocardiogram, cardiac catheterization, and CTA studies.  His echo shows a severely calcified aortic valve with thickened leaflets and restricted mobility.  The mean gradient is 41 mmHg with a valve area of 0.93 cm consistent with severe aortic stenosis.  Left ventricular systolic function is normal.  There is moderate LVH.  Cardiac catheterization shows an 80% proximal LAD stenosis with otherwise mild to moderate disease.  The patient has no chest discomfort and I think this can be treated medically.  I agree that aortic valve replacement is the best treatment for him to relieve his progressive symptoms and prevent left ventricular dysfunction.  Given his age and comorbidities I do not think he would be a good candidate for open surgical AVR.  Transcatheter aortic valve replacement would be a reasonable alternative for treating him and allow him to proceed with treatment of his rectal carcinoma as quickly as possible.  His gated cardiac CTA shows anatomy suitable for  TAVR using a 26 mm SAPIEN 3 valve.  His abdominal and pelvic CTA shows adequate pelvic vascular anatomy to allow transfemoral insertion.   The patient was counseled at length regarding treatment alternatives for management of severe symptomatic aortic stenosis. The risks and benefits of surgical intervention has been discussed in detail. Long-term prognosis with medical therapy was discussed. Alternative approaches such as conventional surgical aortic valve replacement, transcatheter aortic valve replacement, and palliative medical  therapy were compared and contrasted at length. This discussion was placed in the context of the patient's own specific clinical presentation and past medical history. All of his questions have been addressed.    Following the decision to proceed with transcatheter aortic valve replacement, a discussion was held regarding what types of management strategies would be attempted intraoperatively in the event of life-threatening complications.  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  The patient has been advised of a variety of complications that might develop including but not limited to risks of death, stroke, paravalvular leak, aortic dissection or other major vascular complications, aortic annulus rupture, device embolization, cardiac rupture or perforation, mitral regurgitation, acute myocardial infarction, arrhythmia, heart block or bradycardia requiring permanent pacemaker placement, congestive heart failure, respiratory failure, renal failure, pneumonia, infection, other late complications related to structural valve deterioration or migration, or other complications that might ultimately cause a temporary or permanent loss of functional independence or other long term morbidity. The patient provides full informed consent for the procedure as described  and all questions were answered.       Plan:   Transfemoral TAVR using a SAPIEN 3 valve.     [name], MD

## 2023-02-25 ENCOUNTER — Inpatient Hospital Stay (HOSPITAL_COMMUNITY): Payer: No Typology Code available for payment source | Admitting: Physician Assistant

## 2023-02-25 ENCOUNTER — Inpatient Hospital Stay (HOSPITAL_COMMUNITY): Payer: No Typology Code available for payment source | Admitting: Anesthesiology

## 2023-02-25 ENCOUNTER — Inpatient Hospital Stay (HOSPITAL_COMMUNITY): Payer: No Typology Code available for payment source

## 2023-02-25 ENCOUNTER — Inpatient Hospital Stay (HOSPITAL_COMMUNITY)
Admission: RE | Admit: 2023-02-25 | Discharge: 2023-02-27 | DRG: 267 | Disposition: A | Discharge status: Home Health | Payer: No Typology Code available for payment source | Attending: Surgery | Admitting: Surgery

## 2023-02-25 ENCOUNTER — Encounter (HOSPITAL_COMMUNITY): Payer: Self-pay | Admitting: Cardiovascular Disease

## 2023-02-25 ENCOUNTER — Encounter (HOSPITAL_COMMUNITY)
Admission: RE | Disposition: A | Discharge status: Home Health | Payer: Self-pay | Source: Home / Self Care | Attending: Surgery

## 2023-02-25 ENCOUNTER — Other Ambulatory Visit: Payer: Self-pay | Admitting: Cardiology

## 2023-02-25 DIAGNOSIS — Z9049 Acquired absence of other specified parts of digestive tract: Secondary | ICD-10-CM

## 2023-02-25 DIAGNOSIS — I350 Nonrheumatic aortic (valve) stenosis: Secondary | ICD-10-CM

## 2023-02-25 DIAGNOSIS — I480 Paroxysmal atrial fibrillation: Secondary | ICD-10-CM | POA: Diagnosis present

## 2023-02-25 DIAGNOSIS — I10 Essential (primary) hypertension: Secondary | ICD-10-CM | POA: Diagnosis present

## 2023-02-25 DIAGNOSIS — I5032 Chronic diastolic (congestive) heart failure: Secondary | ICD-10-CM | POA: Diagnosis present

## 2023-02-25 DIAGNOSIS — I5030 Unspecified diastolic (congestive) heart failure: Secondary | ICD-10-CM | POA: Insufficient documentation

## 2023-02-25 DIAGNOSIS — Z87891 Personal history of nicotine dependence: Secondary | ICD-10-CM

## 2023-02-25 DIAGNOSIS — E782 Mixed hyperlipidemia: Secondary | ICD-10-CM | POA: Diagnosis present

## 2023-02-25 DIAGNOSIS — G4733 Obstructive sleep apnea (adult) (pediatric): Secondary | ICD-10-CM | POA: Diagnosis present

## 2023-02-25 DIAGNOSIS — I110 Hypertensive heart disease with heart failure: Secondary | ICD-10-CM | POA: Diagnosis present

## 2023-02-25 DIAGNOSIS — Z8249 Family history of ischemic heart disease and other diseases of the circulatory system: Secondary | ICD-10-CM

## 2023-02-25 DIAGNOSIS — Z7901 Long term (current) use of anticoagulants: Secondary | ICD-10-CM | POA: Diagnosis not present

## 2023-02-25 DIAGNOSIS — K219 Gastro-esophageal reflux disease without esophagitis: Secondary | ICD-10-CM | POA: Diagnosis present

## 2023-02-25 DIAGNOSIS — Z952 Presence of prosthetic heart valve: Principal | ICD-10-CM

## 2023-02-25 DIAGNOSIS — Z006 Encounter for examination for normal comparison and control in clinical research program: Secondary | ICD-10-CM

## 2023-02-25 DIAGNOSIS — E785 Hyperlipidemia, unspecified: Secondary | ICD-10-CM | POA: Diagnosis present

## 2023-02-25 DIAGNOSIS — Z79899 Other long term (current) drug therapy: Secondary | ICD-10-CM | POA: Diagnosis not present

## 2023-02-25 DIAGNOSIS — I2510 Atherosclerotic heart disease of native coronary artery without angina pectoris: Secondary | ICD-10-CM | POA: Insufficient documentation

## 2023-02-25 DIAGNOSIS — C20 Malignant neoplasm of rectum: Secondary | ICD-10-CM | POA: Diagnosis present

## 2023-02-25 DIAGNOSIS — I4891 Unspecified atrial fibrillation: Secondary | ICD-10-CM | POA: Diagnosis present

## 2023-02-25 DIAGNOSIS — J309 Allergic rhinitis, unspecified: Secondary | ICD-10-CM | POA: Diagnosis present

## 2023-02-25 DIAGNOSIS — E119 Type 2 diabetes mellitus without complications: Secondary | ICD-10-CM | POA: Diagnosis present

## 2023-02-25 DIAGNOSIS — Z961 Presence of intraocular lens: Secondary | ICD-10-CM | POA: Diagnosis present

## 2023-02-25 DIAGNOSIS — Z7984 Long term (current) use of oral hypoglycemic drugs: Secondary | ICD-10-CM

## 2023-02-25 DIAGNOSIS — I5033 Acute on chronic diastolic (congestive) heart failure: Secondary | ICD-10-CM

## 2023-02-25 DIAGNOSIS — C189 Malignant neoplasm of colon, unspecified: Secondary | ICD-10-CM | POA: Insufficient documentation

## 2023-02-25 DIAGNOSIS — R5381 Other malaise: Secondary | ICD-10-CM

## 2023-02-25 DIAGNOSIS — Z7722 Contact with and (suspected) exposure to environmental tobacco smoke (acute) (chronic): Secondary | ICD-10-CM | POA: Diagnosis present

## 2023-02-25 DIAGNOSIS — M170 Bilateral primary osteoarthritis of knee: Secondary | ICD-10-CM | POA: Diagnosis present

## 2023-02-25 DIAGNOSIS — Z6837 Body mass index (BMI) 37.0-37.9, adult: Secondary | ICD-10-CM | POA: Diagnosis not present

## 2023-02-25 DIAGNOSIS — Z96651 Presence of right artificial knee joint: Secondary | ICD-10-CM | POA: Diagnosis present

## 2023-02-25 DIAGNOSIS — Z954 Presence of other heart-valve replacement: Secondary | ICD-10-CM | POA: Diagnosis not present

## 2023-02-25 HISTORY — PX: TRANSCATHETER AORTIC VALVE REPLACEMENT, TRANSFEMORAL: SHX6400

## 2023-02-25 HISTORY — PX: INTRAOPERATIVE TRANSTHORACIC ECHOCARDIOGRAM: SHX6523

## 2023-02-25 HISTORY — DX: Presence of prosthetic heart valve: Z95.2

## 2023-02-25 LAB — ECHOCARDIOGRAM LIMITED
AR max vel: 4.04 cm2
AV Area VTI: 4.15 cm2
AV Area mean vel: 3.68 cm2
AV Mean grad: 4 mm[Hg]
AV Peak grad: 6.5 mm[Hg]
Ao pk vel: 1.27 m/s
Calc EF: 45.1 %
S' Lateral: 4.3 cm
Single Plane A2C EF: 45.7 %
Single Plane A4C EF: 40.8 %

## 2023-02-25 LAB — POCT I-STAT, CHEM 8
BUN: 17 mg/dL (ref 8–23)
Calcium, Ion: 1.16 mmol/L (ref 1.15–1.40)
Chloride: 108 mmol/L (ref 98–111)
Creatinine, Ser: 0.8 mg/dL (ref 0.61–1.24)
Glucose, Bld: 141 mg/dL — ABNORMAL HIGH (ref 70–99)
HCT: 30 % — ABNORMAL LOW (ref 39.0–52.0)
Hemoglobin: 10.2 g/dL — ABNORMAL LOW (ref 13.0–17.0)
Potassium: 4 mmol/L (ref 3.5–5.1)
Sodium: 144 mmol/L (ref 135–145)
TCO2: 25 mmol/L (ref 22–32)

## 2023-02-25 LAB — GLUCOSE, CAPILLARY
Glucose-Capillary: 122 mg/dL — ABNORMAL HIGH (ref 70–99)
Glucose-Capillary: 114 mg/dL — ABNORMAL HIGH (ref 70–99)
Glucose-Capillary: 123 mg/dL — ABNORMAL HIGH (ref 70–99)
Glucose-Capillary: 101 mg/dL — ABNORMAL HIGH (ref 70–99)
Glucose-Capillary: 96 mg/dL (ref 70–99)
Glucose-Capillary: 114 mg/dL — ABNORMAL HIGH (ref 70–99)

## 2023-02-25 LAB — POCT ACTIVATED CLOTTING TIME: Activated Clotting Time: 263 s

## 2023-02-25 SURGERY — TRANSCATHETER AORTIC VALVE REPLACEMENT, TRANSFEMORAL
Anesthesia: Monitor Anesthesia Care

## 2023-02-25 MED ORDER — SODIUM CHLORIDE 0.9% FLUSH
3.0000 mL | Freq: Two times a day (BID) | INTRAVENOUS | Status: DC
  Administered 2023-02-26 (×2): 3 mL via INTRAVENOUS

## 2023-02-25 MED ORDER — ACETAMINOPHEN 325 MG PO TABS
650.0000 mg | ORAL_TABLET | Freq: Four times a day (QID) | ORAL | Status: DC | PRN
  Administered 2023-02-25 – 2023-02-26 (×3): 650 mg via ORAL
  Filled 2023-02-25 (×3): qty 2

## 2023-02-25 MED ORDER — SODIUM CHLORIDE 0.9 % IV SOLN: INTRAVENOUS | Status: DC

## 2023-02-25 MED ORDER — METOPROLOL TARTRATE 25 MG PO TABS
25.0000 mg | ORAL_TABLET | Freq: Two times a day (BID) | ORAL | Status: DC
  Administered 2023-02-25: 25 mg via ORAL
  Filled 2023-02-25: qty 1

## 2023-02-25 MED ORDER — HEPARIN SODIUM (PORCINE) 1000 UNIT/ML IJ SOLN
INTRAMUSCULAR | Status: AC
  Filled 2023-02-25: qty 20

## 2023-02-25 MED ORDER — CHLORHEXIDINE GLUCONATE 4 % EX SOLN: 60.0000 mL | Freq: Once | CUTANEOUS | Status: DC

## 2023-02-25 MED ORDER — ONDANSETRON HCL 4 MG/2ML IJ SOLN: 4.0000 mg | Freq: Four times a day (QID) | INTRAMUSCULAR | Status: DC | PRN

## 2023-02-25 MED ORDER — ROSUVASTATIN CALCIUM 20 MG PO TABS
20.0000 mg | ORAL_TABLET | Freq: Every day | ORAL | Status: DC
  Administered 2023-02-25 – 2023-02-27 (×3): 20 mg via ORAL
  Filled 2023-02-25 (×3): qty 1

## 2023-02-25 MED ORDER — NITROFURANTOIN MONOHYD MACRO 100 MG PO CAPS
100.0000 mg | ORAL_CAPSULE | Freq: Two times a day (BID) | ORAL | Status: DC
  Administered 2023-02-25 – 2023-02-27 (×4): 100 mg via ORAL
  Filled 2023-02-25 (×5): qty 1

## 2023-02-25 MED ORDER — MORPHINE SULFATE (PF) 2 MG/ML IV SOLN: 1.0000 mg | INTRAVENOUS | Status: DC | PRN

## 2023-02-25 MED ORDER — DILTIAZEM HCL ER COATED BEADS 120 MG PO CP24
120.0000 mg | ORAL_CAPSULE | Freq: Every day | ORAL | Status: DC
  Administered 2023-02-25 – 2023-02-27 (×3): 120 mg via ORAL
  Filled 2023-02-25 (×4): qty 1

## 2023-02-25 MED ORDER — TRAMADOL HCL 50 MG PO TABS: 50.0000 mg | ORAL_TABLET | ORAL | Status: DC | PRN

## 2023-02-25 MED ORDER — PROTAMINE SULFATE 10 MG/ML IV SOLN
INTRAVENOUS | Status: DC | PRN
Start: 2023-02-25 — End: 2023-02-25
  Administered 2023-02-25: 160 mg via INTRAVENOUS

## 2023-02-25 MED ORDER — FENTANYL CITRATE (PF) 100 MCG/2ML IJ SOLN
INTRAMUSCULAR | Status: AC
  Filled 2023-02-25: qty 2

## 2023-02-25 MED ORDER — OXYBUTYNIN CHLORIDE 5 MG PO TABS
5.0000 mg | ORAL_TABLET | Freq: Every evening | ORAL | Status: DC
  Administered 2023-02-25 – 2023-02-26 (×3): 5 mg via ORAL
  Filled 2023-02-25 (×3): qty 1

## 2023-02-25 MED ORDER — FENTANYL CITRATE (PF) 100 MCG/2ML IJ SOLN
INTRAMUSCULAR | Status: DC | PRN
  Administered 2023-02-25: 50 ug via INTRAVENOUS
  Administered 2023-02-25 (×2): 25 ug via INTRAVENOUS

## 2023-02-25 MED ORDER — ACETAMINOPHEN 500 MG PO TABS
1000.0000 mg | ORAL_TABLET | Freq: Once | ORAL | Status: AC
  Administered 2023-02-25: 1000 mg via ORAL
  Filled 2023-02-25: qty 2

## 2023-02-25 MED ORDER — NITROGLYCERIN IN D5W 200-5 MCG/ML-% IV SOLN: 0.0000 ug/min | INTRAVENOUS | Status: DC

## 2023-02-25 MED ORDER — SODIUM CHLORIDE 0.9 % IV SOLN: 250.0000 mL | INTRAVENOUS | Status: DC | PRN

## 2023-02-25 MED ORDER — SODIUM CHLORIDE 0.9 % IV SOLN: 250.0000 mL | INTRAVENOUS | Status: DC

## 2023-02-25 MED ORDER — MELATONIN 5 MG PO TABS
10.0000 mg | ORAL_TABLET | Freq: Every day | ORAL | Status: DC
  Administered 2023-02-25 – 2023-02-26 (×2): 10 mg via ORAL
  Filled 2023-02-25 (×3): qty 2

## 2023-02-25 MED ORDER — SODIUM CHLORIDE 0.9 % IV SOLN
INTRAVENOUS | Status: DC | PRN
Start: 2023-02-25 — End: 2023-02-25

## 2023-02-25 MED ORDER — LIDOCAINE HCL (PF) 1 % IJ SOLN
INTRAMUSCULAR | Status: DC | PRN
  Administered 2023-02-25 (×2): 10 mL

## 2023-02-25 MED ORDER — SODIUM CHLORIDE 0.9% FLUSH
3.0000 mL | Freq: Two times a day (BID) | INTRAVENOUS | Status: DC
  Administered 2023-02-25 – 2023-02-26 (×3): 3 mL via INTRAVENOUS

## 2023-02-25 MED ORDER — APIXABAN 5 MG PO TABS
5.0000 mg | ORAL_TABLET | Freq: Two times a day (BID) | ORAL | Status: DC
  Administered 2023-02-26 – 2023-02-27 (×3): 5 mg via ORAL
  Filled 2023-02-25 (×3): qty 1

## 2023-02-25 MED ORDER — PROPOFOL 10 MG/ML IV BOLUS
INTRAVENOUS | Status: DC | PRN
  Administered 2023-02-25: 20 mg via INTRAVENOUS

## 2023-02-25 MED ORDER — SODIUM CHLORIDE 0.9% FLUSH: 3.0000 mL | INTRAVENOUS | Status: DC | PRN

## 2023-02-25 MED ORDER — SODIUM CHLORIDE 0.9 % IV SOLN: INTRAVENOUS | Status: AC

## 2023-02-25 MED ORDER — INSULIN ASPART 100 UNIT/ML IJ SOLN: 0.0000 [IU] | INTRAMUSCULAR | Status: DC | PRN

## 2023-02-25 MED ORDER — HEPARIN (PORCINE) IN NACL 1000-0.9 UT/500ML-% IV SOLN
INTRAVENOUS | Status: DC | PRN
  Administered 2023-02-25 (×3): 500 mL

## 2023-02-25 MED ORDER — PROPOFOL 500 MG/50ML IV EMUL
INTRAVENOUS | Status: DC | PRN
Start: 2023-02-25 — End: 2023-02-25
  Administered 2023-02-25: 25 ug/kg/min via INTRAVENOUS

## 2023-02-25 MED ORDER — CHLORHEXIDINE GLUCONATE 4 % EX SOLN: 30.0000 mL | CUTANEOUS | Status: DC

## 2023-02-25 MED ORDER — INSULIN ASPART 100 UNIT/ML IJ SOLN
0.0000 [IU] | Freq: Three times a day (TID) | INTRAMUSCULAR | Status: DC
  Administered 2023-02-26: 2 [IU] via SUBCUTANEOUS

## 2023-02-25 MED ORDER — POTASSIUM CHLORIDE CRYS ER 10 MEQ PO TBCR
10.0000 meq | EXTENDED_RELEASE_TABLET | Freq: Every day | ORAL | Status: DC
  Administered 2023-02-25 – 2023-02-27 (×3): 10 meq via ORAL
  Filled 2023-02-25 (×3): qty 1

## 2023-02-25 MED ORDER — OXYCODONE HCL 5 MG PO TABS
5.0000 mg | ORAL_TABLET | ORAL | Status: DC | PRN
  Administered 2023-02-25 (×2): 10 mg via ORAL
  Filled 2023-02-25 (×2): qty 2

## 2023-02-25 MED ORDER — HEPARIN SODIUM (PORCINE) 1000 UNIT/ML IJ SOLN
INTRAMUSCULAR | Status: DC | PRN
Start: 2023-02-25 — End: 2023-02-25
  Administered 2023-02-25: 18000 [IU] via INTRAVENOUS

## 2023-02-25 MED ORDER — PANTOPRAZOLE SODIUM 40 MG PO TBEC
40.0000 mg | DELAYED_RELEASE_TABLET | Freq: Every day | ORAL | Status: DC
  Administered 2023-02-25 – 2023-02-27 (×3): 40 mg via ORAL
  Filled 2023-02-25 (×3): qty 1

## 2023-02-25 MED ORDER — CEFAZOLIN SODIUM-DEXTROSE 2-4 GM/100ML-% IV SOLN
2.0000 g | Freq: Three times a day (TID) | INTRAVENOUS | Status: AC
  Administered 2023-02-25 (×2): 2 g via INTRAVENOUS
  Filled 2023-02-25 (×2): qty 100

## 2023-02-25 MED ORDER — PHENYLEPHRINE 80 MCG/ML (10ML) SYRINGE FOR IV PUSH (FOR BLOOD PRESSURE SUPPORT)
PREFILLED_SYRINGE | INTRAVENOUS | Status: DC | PRN
  Administered 2023-02-25: 80 ug via INTRAVENOUS
  Administered 2023-02-25: 160 ug via INTRAVENOUS
  Administered 2023-02-25 (×2): 80 ug via INTRAVENOUS

## 2023-02-25 MED ORDER — PROTAMINE SULFATE 10 MG/ML IV SOLN
INTRAVENOUS | Status: AC
  Filled 2023-02-25: qty 10

## 2023-02-25 MED ORDER — ACETAMINOPHEN 650 MG RE SUPP: 650.0000 mg | Freq: Four times a day (QID) | RECTAL | Status: DC | PRN

## 2023-02-25 MED ORDER — IODIXANOL 320 MG/ML IV SOLN
INTRAVENOUS | Status: DC | PRN
  Administered 2023-02-25: 38 mL via INTRA_ARTERIAL

## 2023-02-25 MED ORDER — ONDANSETRON HCL 4 MG/2ML IJ SOLN
INTRAMUSCULAR | Status: DC | PRN
  Administered 2023-02-25: 4 mg via INTRAVENOUS

## 2023-02-25 MED ORDER — CHLORHEXIDINE GLUCONATE 0.12 % MT SOLN
15.0000 mL | Freq: Once | OROMUCOSAL | Status: AC
  Administered 2023-02-25: 15 mL via OROMUCOSAL
  Filled 2023-02-25: qty 15

## 2023-02-25 SURGICAL SUPPLY — 28 items
CABLE ADAPT PACING TEMP 12FT (ADAPTER) ×1 IMPLANT
CATH 26 ULTRA DELIVERY (CATHETERS) ×1 IMPLANT
CATH DIAG 6FR PIGTAIL ANGLED (CATHETERS) ×1 IMPLANT
CATH INFINITI 5FR ANG PIGTAIL (CATHETERS) ×1 IMPLANT
CATH INFINITI 6F AL1 (CATHETERS) ×1 IMPLANT
CATH S G BIP PACING (CATHETERS) ×1 IMPLANT
CLOSURE MYNX CONTROL 6F/7F (Vascular Products) ×1 IMPLANT
CLOSURE PERCLOSE PROSTYLE (VASCULAR PRODUCTS) ×2 IMPLANT
CRIMPER (MISCELLANEOUS) ×1 IMPLANT
DEVICE INFLATION ATRION QL2530 (MISCELLANEOUS) ×1 IMPLANT
KIT MICROPUNCTURE NIT STIFF (SHEATH) ×1 IMPLANT
KIT SAPIAN 3 ULTRA RESILIA 26 (Valve) ×1 IMPLANT
PACK CARDIAC CATHETERIZATION (CUSTOM PROCEDURE TRAY) ×2 IMPLANT
PAD SORBX EP SHIELD 16.5X12 (MISCELLANEOUS) ×1 IMPLANT
SET ATX-X65L (MISCELLANEOUS) ×1 IMPLANT
SHEATH BRITE TIP 7FR 35CM (SHEATH) ×1 IMPLANT
SHEATH INTRODUCER SET 20-26 (SHEATH) ×1 IMPLANT
SHEATH PINNACLE 6F 10CM (SHEATH) ×1 IMPLANT
SHEATH PINNACLE 8F 10CM (SHEATH) ×1 IMPLANT
SHEATH PROBE COVER 6X72 (BAG) ×1 IMPLANT
STOPCOCK MORSE 400PSI 3WAY (MISCELLANEOUS) ×4 IMPLANT
TRANSDUCER W/STOPCOCK (MISCELLANEOUS) ×3 IMPLANT
TUBING ART PRESS 72 MALE/FEM (TUBING) ×2 IMPLANT
WIRE AMPLATZ SS-J .035X180CM (WIRE) ×1 IMPLANT
WIRE EMERALD 3MM-J .035X150CM (WIRE) ×1 IMPLANT
WIRE EMERALD 3MM-J .035X260CM (WIRE) ×1 IMPLANT
WIRE EMERALD ST .035X260CM (WIRE) ×1 IMPLANT
WIRE SAFARI SM CURVE 275 (WIRE) ×1 IMPLANT

## 2023-02-25 NOTE — Discharge Instructions (Signed)

## 2023-02-25 NOTE — Transfer of Care (Signed)
Immediate Anesthesia Transfer of Care Note  Patient: Gary Carroll  Procedure(s) Performed: Transcatheter Aortic Valve Replacement, Transfemoral INTRAOPERATIVE TRANSTHORACIC ECHOCARDIOGRAM  Patient Location: Cath Lab  Anesthesia Type:MAC  Level of Consciousness: drowsy and patient cooperative  Airway & Oxygen Therapy: Patient Spontanous Breathing and Patient connected to nasal cannula oxygen  Post-op Assessment: Report given to RN and Post -op Vital signs reviewed and stable  Post vital signs: Reviewed and stable  Last Vitals:  Vitals Value Taken Time  BP 116/77 02/25/23 1141  Temp    Pulse 59 02/25/23 1143  Resp 25 02/25/23 1143  SpO2 98 % 02/25/23 1143  Vitals shown include unfiled device data.  Last Pain:  Vitals:   02/25/23 0805  TempSrc:   PainSc: 2       Patients Stated Pain Goal: 2 (02/25/23 0805)  Complications: There were no known notable events for this encounter.

## 2023-02-25 NOTE — Progress Notes (Addendum)
Mobility Specialist Progress Note:   02/25/23 1617  Mobility  Activity Transferred from bed to chair  Level of Assistance Minimal assist, patient does 75% or more  Assistive Device Front wheel walker  Distance Ambulated (ft) 3 ft  Activity Response Tolerated well  Mobility Referral Yes  $Mobility charge 1 Mobility  Mobility Specialist Start Time (ACUTE ONLY) 1555  Mobility Specialist Stop Time (ACUTE ONLY) 1615  Mobility Specialist Time Calculation (min) (ACUTE ONLY) 20 min   Pre Mobility: 83 HR  During Mobility: 103 HR  Post Mobility: 85 HR   Pt received in bed, agreeable to mobility. Groin sites stable. Pt slightly lethargic during session but requesting to sit in chair. ModA for bed mobility. MinA to stand and pivot d/t line management. Pt left in chair asymptomatic with RN present in room.   Leory Plowman  Mobility Specialist Please contact via [employer] office at [phone]

## 2023-02-25 NOTE — Progress Notes (Signed)
  [facility]   MULTIDISCIPLINARY HEART VALVE TEAM  Patient doing well s/p TAVR. He is hemodynamically stable. Groin sites stable. ECG with AF and no high grade block. Transferred from cath lab holding to 4E.  Early ambulation after bedrest completed and hopeful discharge over the next 24-48 hours.   Georgie Chard NP-C Structural Heart Team  Pager: [phone] Phone: [phone]

## 2023-02-25 NOTE — Op Note (Signed)
[facility]   MULTIDISCIPLINARY HEART VALVE TEAM   TAVR OPERATIVE NOTE   Date of Procedure:  02/25/2023  Preoperative Diagnosis: Severe Aortic Stenosis   Postoperative Diagnosis: Same   Procedure:   Transcatheter Aortic Valve Replacement - Percutaneous Transfemoral Approach  Edwards Sapien 3 Ultra Resilia THV (size 26 mm, serial # [id number])   Co-Surgeons:  Evelene Croon, MD and Tonny Bollman, MD  Anesthesiologist:  Arrie Aran, MD  Echocardiographer:  Charlton Haws, MD  Pre-operative Echo Findings: Severe aortic stenosis Normal left ventricular systolic function  Post-operative Echo Findings: No paravalvular leak Normal/unchanged left ventricular systolic function  BRIEF CLINICAL NOTE AND INDICATIONS FOR SURGERY  77 year old gentleman with type 2 diabetes, morbid obesity, coronary artery disease, and chronic heart failure with preserved ejection fraction, presents today for TAVR.  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  He has been diagnosed with severe aortic stenosis and preoperative studies have demonstrated suitable anatomy for transfemoral insertion of a 26 mm Edwards SAPIEN 3 TAVR valve.  The patient has NYHA functional class III symptoms of exertional fatigue and shortness of breath.  During the course of the patient's preoperative work up they have been evaluated comprehensively by a multidisciplinary team of specialists coordinated through the Multidisciplinary [facility] Valve Clinic in the [facility].  They have been demonstrated to suffer from symptomatic severe aortic stenosis as noted above. The patient has been counseled extensively as to the relative risks and benefits of all options for the treatment of severe aortic stenosis including long term medical therapy, conventional surgery for aortic valve replacement, and transcatheter aortic valve  replacement.  The patient has been independently evaluated in formal cardiac surgical consultation by Dr Laneta Simmers, who deemed the patient appropriate for TAVR. Based upon review of all of the patient's preoperative diagnostic tests they are felt to be candidate for transcatheter aortic valve replacement using the transfemoral approach as an alternative to conventional surgery.    Following the decision to proceed with transcatheter aortic valve replacement, a discussion has been held regarding what types of management strategies would be attempted intraoperatively in the event of life-threatening complications, including whether or not the patient would be considered a candidate for the use of cardiopulmonary bypass and/or conversion to open sternotomy for attempted surgical intervention.  The patient has been advised of a variety of complications that might develop peculiar to this approach including but not limited to risks of death, stroke, paravalvular leak, aortic dissection or other major vascular complications, aortic annulus rupture, device embolization, cardiac rupture or perforation, acute myocardial infarction, arrhythmia, heart block or bradycardia requiring permanent pacemaker placement, congestive heart failure, respiratory failure, renal failure, pneumonia, infection, other late complications related to structural valve deterioration or migration, or other complications that might ultimately cause a temporary or permanent loss of functional independence or other long term morbidity.  The patient provides full informed consent for the procedure as described and all questions were answered preoperatively.  DETAILS OF THE OPERATIVE PROCEDURE  PREPARATION:   The patient is brought to the operating room on the above mentioned date and central monitoring was established by the anesthesia team including placement of a radial arterial line. The patient is placed in the supine position on the operating  table.  Intravenous antibiotics are administered. The patient is monitored closely throughout the procedure under conscious sedation.  Baseline transthoracic echocardiogram is performed. The patient's  chest, abdomen, both groins, and both lower extremities are prepared and draped in a sterile manner. A time out procedure is performed.   PERIPHERAL ACCESS:   Using ultrasound guidance, femoral arterial and venous access is obtained with placement of 6 Fr sheaths on the left side.  [place] images are digitally captured and stored in the patient's chart. A pigtail diagnostic catheter was passed through the femoral arterial sheath under fluoroscopic guidance into the aortic root.  A temporary transvenous pacemaker catheter was passed through the femoral venous sheath under fluoroscopic guidance into the right ventricle.  The pacemaker was tested to ensure stable lead placement and pacemaker capture. Aortic root angiography was performed in order to determine the optimal angiographic angle for valve deployment.  TRANSFEMORAL ACCESS:  A micropuncture technique is used to access the right femoral artery under fluoroscopic and ultrasound guidance.  2 Perclose devices are deployed at 10' and 2' positions to 'PreClose' the femoral artery. An 8 French sheath is placed and then an Amplatz Superstiff wire is advanced through the sheath. This is changed out for a 14 French transfemoral E-Sheath after progressively dilating over the Superstiff wire.  An AL-2 catheter was used to direct a straight-tip exchange length wire across the native aortic valve into the left ventricle. This was exchanged out for a pigtail catheter and position was confirmed in the LV apex. Simultaneous LV and Ao pressures were recorded.  The pigtail catheter was exchanged for a Safari wire in the LV apex.    BALLOON AORTIC VALVULOPLASTY:  Not performed  TRANSCATHETER HEART VALVE DEPLOYMENT:  An Edwards Sapien 3 Ultra Resilia transcatheter heart  valve (size 26 mm) was prepared and crimped per manufacturer's guidelines, and the proper orientation of the valve is confirmed on the [employer] delivery system. The valve was advanced through the introducer sheath using normal technique until in an appropriate position in the abdominal aorta beyond the sheath tip. The balloon was then retracted and using the fine-tuning wheel was centered on the valve. The valve was then advanced across the aortic arch using appropriate flexion of the catheter. The valve was carefully positioned across the aortic valve annulus. The Commander catheter was retracted using normal technique. Once final position of the valve has been confirmed by angiographic assessment, the valve is deployed while temporarily holding ventilation and during rapid ventricular pacing to maintain systolic blood pressure < 50 mmHg and pulse pressure < 10 mmHg. The balloon inflation is held for >3 seconds after reaching full deployment volume. Once the balloon has fully deflated the balloon is retracted into the ascending aorta and valve function is assessed using echocardiography. The patient's hemodynamic recovery following valve deployment is good.  The deployment balloon and guidewire are both removed. Echo demostrated acceptable post-procedural gradients, stable mitral valve function, and no aortic insufficiency.    PROCEDURE COMPLETION:  The sheath was removed and femoral artery closure is performed using the 2 previously deployed Perclose devices.  Protamine is administered once femoral arterial repair was complete. The site is clear with no evidence of bleeding or hematoma after the sutures are tightened. The temporary pacemaker and pigtail catheters are removed. Mynx closure is used for contralateral femoral arterial hemostasis for the 6 Fr sheath.  The patient tolerated the procedure well and is transported to the recovery area in stable condition. There were no immediate  intraoperative complications. All sponge instrument and needle counts are verified correct at completion of the operation.   The patient received a total of 38  mL of intravenous contrast during the procedure.  EBL: minimal  LVEDP: 14 mmHg   Tonny Bollman, MD 02/25/2023 12:10 PM

## 2023-02-25 NOTE — Anesthesia Preprocedure Evaluation (Addendum)
Anesthesia Evaluation  Patient identified by MRN, date of birth, ID band Patient awake    Reviewed: Allergy & Precautions, NPO status , Patient's Chart, lab work & pertinent test results, reviewed documented beta blocker date and time   Airway Mallampati: III  TM Distance: >3 FB Neck ROM: Full    Dental  (+) Teeth Intact, Dental Advisory Given   Pulmonary asthma , sleep apnea and Continuous Positive Airway Pressure Ventilation , former smoker   Pulmonary exam normal breath sounds clear to auscultation       Cardiovascular hypertension, Pt. on medications and Pt. on home beta blockers + CAD  + Valvular Problems/Murmurs AS  Rhythm:Regular Rate:Normal + Systolic murmurs    Neuro/Psych  Neuromuscular disease    GI/Hepatic Neg liver ROS, hiatal hernia,GERD  Medicated,,  Endo/Other  diabetes, Type 2  Obesity   Renal/GU Renal disease Bladder dysfunction      Musculoskeletal  (+) Arthritis ,    Abdominal   Peds  Hematology  (+) Blood dyscrasia (Eliquis), anemia   Anesthesia Other Findings Day of surgery medications reviewed with the patient.  Reproductive/Obstetrics                             Anesthesia Physical Anesthesia Plan  ASA: 4  Anesthesia Plan: MAC   Post-op Pain Management: Tylenol PO (pre-op)*   Induction: Intravenous  PONV Risk Score and Plan: 1 and TIVA and Treatment may vary due to age or medical condition  Airway Management Planned: Simple Face Mask and Natural Airway  Additional Equipment:   Intra-op Plan:   Post-operative Plan:   Informed Consent: I have reviewed the patients History and Physical, chart, labs and discussed the procedure including the risks, benefits and alternatives for the proposed anesthesia with the patient or authorized representative who has indicated his/her understanding and acceptance.     Dental advisory given  Plan Discussed with:  CRNA  Anesthesia Plan Comments: (2nd PIV)       Anesthesia Quick Evaluation

## 2023-02-25 NOTE — Progress Notes (Signed)
Pt admitted to rm 22 from cath lab. Initiated tele. Obtained vs. Call bell within reach.  Lawson Radar, RN

## 2023-02-25 NOTE — Progress Notes (Signed)
  Echocardiogram 2D Echocardiogram has been performed.  Sheralyn Boatman R 02/25/2023, 11:12 AM

## 2023-02-25 NOTE — Op Note (Signed)
[facility]   MULTIDISCIPLINARY HEART VALVE TEAM   TAVR OPERATIVE NOTE   Date of Procedure:  02/25/2023  Preoperative Diagnosis: Severe Aortic Stenosis   Postoperative Diagnosis: Same   Procedure:   Transcatheter Aortic Valve Replacement - Percutaneous Right Transfemoral Approach  Edwards Sapien 3 Ultra Resilia THV (size 26 mm, model # 9755RSL, serial # [id number])   Co-Surgeons:  [name], MD and Tonny Bollman, MD    Anesthesiologist:  S. Desmond Lope, MD  Echocardiographer:  P. Eden Emms, MD  Pre-operative Echo Findings: Severe aortic stenosis Normal left ventricular systolic function  Post-operative Echo Findings: No paravalvular leak Normal left ventricular systolic function   BRIEF CLINICAL NOTE AND INDICATIONS FOR SURGERY  This 77 year old gentleman has stage D, severe, symptomatic aortic stenosis with NYHA class III symptoms of exertional fatigue and shortness of breath consistent with chronic diastolic congestive heart failure.  I have personally reviewed his 2D echocardiogram, cardiac catheterization, and CTA studies.  His echo shows a severely calcified aortic valve with thickened leaflets and restricted mobility.  The mean gradient is 41 mmHg with a valve area of 0.93 cm consistent with severe aortic stenosis.  Left ventricular systolic function is normal.  There is moderate LVH.  Cardiac catheterization shows an 80% proximal LAD stenosis with otherwise mild to moderate disease.  The patient has no chest discomfort and I think this can be treated medically.  I agree that aortic valve replacement is the best treatment for him to relieve his progressive symptoms and prevent left ventricular dysfunction.  Given his age and comorbidities I do not think he would be a good candidate for open surgical AVR.  Transcatheter aortic valve replacement would be a reasonable alternative for treating him and allow him to proceed with treatment of his rectal carcinoma as  quickly as possible.  His gated cardiac CTA shows anatomy suitable for TAVR using a 26 mm SAPIEN 3 valve.  His abdominal and pelvic CTA shows adequate pelvic vascular anatomy to allow transfemoral insertion.   The patient was counseled at length regarding treatment alternatives for management of severe symptomatic aortic stenosis. The risks and benefits of surgical intervention has been discussed in detail. Long-term prognosis with medical therapy was discussed. Alternative approaches such as conventional surgical aortic valve replacement, transcatheter aortic valve replacement, and palliative medical therapy were compared and contrasted at length. This discussion was placed in the context of the patient's own specific clinical presentation and past medical history. All of his questions have been addressed.    Following the decision to proceed with transcatheter aortic valve replacement, a discussion was held regarding what types of management strategies would be attempted intraoperatively in the event of life-threatening complications.  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  The patient has been advised of a variety of complications that might develop including but not limited to risks of death, stroke, paravalvular leak, aortic dissection or other major vascular complications, aortic annulus rupture, device embolization, cardiac rupture or perforation, mitral regurgitation, acute myocardial infarction, arrhythmia, heart block or bradycardia requiring permanent pacemaker placement, congestive heart failure, respiratory failure, renal failure, pneumonia, infection, other late complications related to structural valve deterioration or migration, or other complications that might ultimately cause a temporary or permanent loss of functional independence or other long term morbidity.  The patient provides full informed consent for the procedure as described and all questions were answered.  DETAILS OF THE OPERATIVE PROCEDURE  PREPARATION:    The patient was brought to the operating room on the above mentioned date and appropriate monitoring was established by the anesthesia team. The patient was placed in the supine position on the operating table.  Intravenous antibiotics were administered. The patient was monitored closely throughout the procedure under conscious sedation.    Baseline transthoracic echocardiogram was performed. The patient's abdomen and both groins were prepped and draped in a sterile manner. A time out procedure was performed.   PERIPHERAL ACCESS:    Using the modified Seldinger technique, femoral arterial and venous access was obtained with placement of 6 Fr sheaths on the left side.  A pigtail diagnostic catheter was passed through the left arterial sheath under fluoroscopic guidance into the aortic root.  A temporary transvenous pacemaker catheter was passed through the left femoral venous sheath under fluoroscopic guidance into the right ventricle.  The pacemaker was tested to ensure stable lead placement and pacemaker capture. Aortic root angiography was performed in order to determine the optimal angiographic angle for valve deployment.   TRANSFEMORAL ACCESS:   Percutaneous transfemoral access and sheath placement was performed using ultrasound guidance.  The right common femoral artery was cannulated using a micropuncture needle and appropriate location was verified using hand injection angiogram.  A pair of Abbott Perclose percutaneous closure devices were placed and a 6 French sheath replaced into the femoral artery.  The patient was heparinized systemically and ACT verified > 250 seconds.    A 14 Fr transfemoral E-sheath was introduced into the right common femoral artery after progressively dilating over an Amplatz superstiff wire. An  AL-2 catheter was used to direct a straight-tip exchange length wire across the native aortic valve into the left ventricle. This was exchanged out for a pigtail catheter and position was confirmed in the LV apex. Simultaneous LV and Ao pressures were recorded.  The pigtail catheter was exchanged for a Safari wire in the LV apex.   BALLOON AORTIC VALVULOPLASTY:   Not performed    TRANSCATHETER HEART VALVE DEPLOYMENT:   An Edwards Sapien 3 Ultra transcatheter heart valve (size 26 mm) was prepared and crimped per manufacturer's guidelines, and the proper orientation of the valve is confirmed on the [employer] delivery system. The valve was advanced through the introducer sheath using normal technique until in an appropriate position in the abdominal aorta beyond the sheath tip. The balloon was then retracted and using the fine-tuning wheel was centered on the valve. The valve was then advanced across the aortic arch using appropriate flexion of the catheter. The valve was carefully positioned across the aortic valve annulus. The Commander catheter was retracted using normal technique. Once final position of the valve has been confirmed by angiographic assessment, the valve is deployed during rapid ventricular pacing to maintain systolic blood pressure < 50 mmHg and pulse pressure < 10 mmHg. The balloon inflation is held for >3 seconds after reaching full deployment volume. Once the balloon has fully deflated the balloon is retracted into the ascending aorta and valve function is assessed using echocardiography. There is felt to be no paravalvular leak and no central aortic insufficiency.  The patient's hemodynamic recovery following valve deployment is good.  The deployment balloon and guidewire are both removed.    PROCEDURE COMPLETION:   The sheath was removed and femoral artery closure performed.  Protamine was administered once femoral arterial repair was complete. The temporary pacemaker,  pigtail catheter and femoral sheaths were removed  with manual pressure used for venous hemostasis.  A Mynx femoral closure device was utilized following removal of the diagnostic sheath in the left femoral artery.  The patient tolerated the procedure well and is transported to the cath lab recovery area in stable condition. There were no immediate intraoperative complications. All sponge instrument and needle counts are verified correct at completion of the operation.   No blood products were administered during the operation.  The patient received a total of 38 mL of intravenous contrast during the procedure.   [name], MD 02/25/2023

## 2023-02-25 NOTE — Interval H&P Note (Signed)
History and Physical Interval Note:  02/25/2023 9:29 AM  Gary Carroll  has presented today for surgery, with the diagnosis of Severe Aortic Stenosis.  The various methods of treatment have been discussed with the patient and family. After consideration of risks, benefits and other options for treatment, the patient has consented to  Procedure(s): Transcatheter Aortic Valve Replacement, Transfemoral (N/A) INTRAOPERATIVE TRANSTHORACIC ECHOCARDIOGRAM (N/A) as a surgical intervention.  The patient's history has been reviewed, patient examined, no change in status, stable for surgery.  I have reviewed the patient's chart and labs.  Questions were answered to the patient's satisfaction.     [name]

## 2023-02-25 NOTE — Discharge Summary (Addendum)
[facility]   MULTIDISCIPLINARY HEART VALVE TEAM  Discharge Summary    Patient ID: Gary Carroll MRN: [id number]; DOB: May 03, 1946  Admit date: 02/25/2023 Discharge date: 02/25/2023  Primary Care Provider: Benita Stabile, MD  Primary Cardiologist:   Discharge Diagnoses    Principal Problem:   Severe aortic stenosis Active Problems:   Essential hypertension, benign   HLD (hyperlipidemia)   AF (paroxysmal atrial fibrillation) (HCC)   Chronic anticoagulation   CAD (coronary artery disease)   (HFpEF) heart failure with preserved ejection fraction (HCC)   Adenocarcinoma, colon (HCC)  Allergies Allergies  Allergen Reactions   Diovan [Valsartan] Cough   Tagamet [Cimetidine] Other (See Comments)    irrittability    Diagnostic Studies/Procedures      TAVR OPERATIVE NOTE     Date of Procedure:                02/25/2023   Preoperative Diagnosis:      Severe Aortic Stenosis    Postoperative Diagnosis:    Same    Procedure:        Transcatheter Aortic Valve Replacement - Percutaneous Right Transfemoral Approach             Edwards Sapien 3 Ultra Resilia THV (size 26 mm, model # 9755RSL, serial # [id number])              Co-Surgeons:                        [name], MD and Tonny Bollman, MD      Anesthesiologist:                  Rondall Allegra, MD   Echocardiographer:              Burna Forts, MD   Pre-operative Echo Findings: Severe aortic stenosis Normal left ventricular systolic function   Post-operative Echo Findings: No paravalvular leak Normal left ventricular systolic function ____________  Echo 02/26/2023: Completed but pending formal read at the time of discharge   History of Present Illness     Gary Carroll is a 77 y.o. 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   Gary Carroll is followed at the [place]. He recently developed progressive DOE and fatigue. Echocardiogram performed 12/21/22 showed EF 60-65%, mod LVH, mild AR, and severe AS with a mean gradient of 41 mmHg. He was referred to Dr. Lynnette Caffey for structural heart consultation and set up for diagnostic L/RHC on 02/05/23. This showed a high-grade proximal 80% LAD lesion with otherwise mild to moderate diffuse disease. Medical therapy was recommended given the lack of angina.   His situation was further complicated by the fact that he was recently diagnosed with rectal adenocarcinoma and will require chemo and radiation therapy.   He was then evaluated by the multidisciplinary valve team and felt to have severe, symptomatic aortic stenosis and to be a suitable candidate for TAVR, which was set up for 02/25/2023.       Hospital Course   Severe AS: s/p successful TAVR with a 26 mm Edwards Sapien 3 THV via the TF  approach on 02/25/2023. Post operative echo completed but pending formal read. Groin  sites are stable. ECG with NSR and no high grade heart block. Patient was resumed on home Eliquis monotherapy. He will require lifelong dental SBE. This will be discussed at [facility] follow up next week. He ambulated with CRI with no issues and has been recommended for CRII. Plan close follow up with our team.   Plan to discuss having the patient transition to Dr. Dominga Ferry in [place] for general cardiology.   Chronic HFpEF: Stable with procedural LVEDP at . Resumed on home Lasix at discharge.   PAF on Eliquis: Rates remain stable. Resumed on Eliquis, diltiazem, and metoprolol with no issues.   CAD: Pre TAVR cath with high grade 80% LAD stenosis with otherwise diffuse disease. Plan to continue medical management given no anginal symptoms. No ASA given OAC. Continue metoprolol, crestor.   Rectal adenocarcinoma: Recently dx and  followed by radiation oncology with plans to begin therapy after TAVR.   Incidental findings: Circumferential wall thickening of the rectum, consistent with known malignancy. Unchanged enlarged mediastinal, hilar, periportal, retroperitoneal, pelvic and bilateral inguinal lymph nodes. Small bilateral pleural effusions and mild diffuse ground-glass opacities, likely due to pulmonary edema. Unchanged linear nodular opacities of the left upper lobe.     Consultants: None    The patient has been seen and examined by Dr. Laneta Simmers who feels that he is stable and ready for discharge today, 02/26/2023.  _____________  Discharge Vitals Blood pressure (!) 154/90, pulse (!) 106, temperature 98 F (36.7 C), temperature source Oral, resp. rate 18, height 5\' 10"  (1.778 m), weight 118.4 kg, SpO2 100%.  Filed Weights   02/25/23 0723  Weight: 118.4 kg    *** physical exam   Disposition   Pt is being discharged home today in good condition.  Follow-up Plans & Appointments       Discharge Medications   Allergies as of 02/25/2023       Reactions   Diovan [valsartan] Cough   Tagamet [cimetidine] Other (See Comments)   irrittability     Med Rec must be completed prior to using this SMARTLINK***           Outstanding Labs/Studies   ***  Duration of Discharge Encounter   Greater than 30 minutes including physician time.  SignedGeorgie Chard, NP 02/25/2023, 10:17 AM [phone]

## 2023-02-25 NOTE — Anesthesia Procedure Notes (Signed)
Procedure Name: MAC Date/Time: 02/25/2023 10:15 AM  Performed by: Randon Goldsmith, CRNAPre-anesthesia Checklist: Patient identified, Emergency Drugs available, Suction available and Patient being monitored Patient Re-evaluated:Patient Re-evaluated prior to induction Oxygen Delivery Method: Simple face mask

## 2023-02-25 NOTE — Anesthesia Postprocedure Evaluation (Signed)
Anesthesia Post Note  Patient: Gary Carroll  Procedure(s) Performed: Transcatheter Aortic Valve Replacement, Transfemoral INTRAOPERATIVE TRANSTHORACIC ECHOCARDIOGRAM     Patient location during evaluation: Cath Lab Anesthesia Type: MAC Level of consciousness: sedated Pain management: pain level controlled Vital Signs Assessment: post-procedure vital signs reviewed and stable Respiratory status: patient remains intubated per anesthesia plan Cardiovascular status: stable Postop Assessment: no apparent nausea or vomiting Anesthetic complications: no   There were no known notable events for this encounter.  Last Vitals:  Vitals:   02/25/23 1230 02/25/23 1317  BP: 94/63 106/68  Pulse: 60 64  Resp: 19 17  Temp:    SpO2: 94% 94%    Last Pain:  Vitals:   02/25/23 1215  TempSrc: Tympanic  PainSc:                  Collene Schlichter

## 2023-02-26 ENCOUNTER — Inpatient Hospital Stay (HOSPITAL_COMMUNITY): Payer: No Typology Code available for payment source

## 2023-02-26 ENCOUNTER — Encounter (HOSPITAL_COMMUNITY): Payer: Self-pay | Admitting: Cardiovascular Disease

## 2023-02-26 ENCOUNTER — Encounter: Payer: No Typology Code available for payment source | Admitting: Surgery

## 2023-02-26 DIAGNOSIS — Z952 Presence of prosthetic heart valve: Secondary | ICD-10-CM | POA: Diagnosis not present

## 2023-02-26 DIAGNOSIS — I350 Nonrheumatic aortic (valve) stenosis: Secondary | ICD-10-CM | POA: Diagnosis not present

## 2023-02-26 DIAGNOSIS — Z954 Presence of other heart-valve replacement: Secondary | ICD-10-CM | POA: Diagnosis not present

## 2023-02-26 LAB — GLUCOSE, CAPILLARY
Glucose-Capillary: 110 mg/dL — ABNORMAL HIGH (ref 70–99)
Glucose-Capillary: 133 mg/dL — ABNORMAL HIGH (ref 70–99)
Glucose-Capillary: 139 mg/dL — ABNORMAL HIGH (ref 70–99)
Glucose-Capillary: 131 mg/dL — ABNORMAL HIGH (ref 70–99)

## 2023-02-26 LAB — BASIC METABOLIC PANEL
Anion gap: 7 (ref 5–15)
BUN: 15 mg/dL (ref 8–23)
CO2: 24 mmol/L (ref 22–32)
Calcium: 8.5 mg/dL — ABNORMAL LOW (ref 8.9–10.3)
Chloride: 105 mmol/L (ref 98–111)
Creatinine, Ser: 0.95 mg/dL (ref 0.61–1.24)
GFR, Estimated: >60 mL/min (ref >60)
Glucose, Bld: 117 mg/dL — ABNORMAL HIGH (ref 70–99)
Potassium: 4.1 mmol/L (ref 3.5–5.1)
Sodium: 136 mmol/L (ref 135–145)

## 2023-02-26 LAB — ECHOCARDIOGRAM COMPLETE
AR max vel: 2.07 cm2
AV Area VTI: 2.33 cm2
AV Area mean vel: 2.53 cm2
AV Mean grad: 11 mm[Hg]
AV Peak grad: 20.8 mm[Hg]
Ao pk vel: 2.28 m/s
Area-P 1/2: 5.42 cm2
Est EF: 60
Height: 70 in
S' Lateral: 3 cm
Weight: 4176 [oz_av]

## 2023-02-26 LAB — CBC
HCT: 33.4 % — ABNORMAL LOW (ref 39.0–52.0)
Hemoglobin: 10 g/dL — ABNORMAL LOW (ref 13.0–17.0)
MCH: 26 pg (ref 26.0–34.0)
MCHC: 29.9 g/dL — ABNORMAL LOW (ref 30.0–36.0)
MCV: 86.8 fL (ref 80.0–100.0)
Platelets: 192 10*3/uL (ref 150–400)
RBC: 3.85 MIL/uL — ABNORMAL LOW (ref 4.22–5.81)
RDW: 16.1 % — ABNORMAL HIGH (ref 11.5–15.5)
WBC: 9.3 10*3/uL (ref 4.0–10.5)
nRBC: 0 % (ref 0.0–0.2)

## 2023-02-26 LAB — MAGNESIUM: Magnesium: 1.8 mg/dL (ref 1.7–2.4)

## 2023-02-26 MED ORDER — METOPROLOL TARTRATE 5 MG/5ML IV SOLN
2.5000 mg | INTRAVENOUS | Status: AC
  Administered 2023-02-26 (×2): 2.5 mg via INTRAVENOUS
  Filled 2023-02-26: qty 5

## 2023-02-26 MED ORDER — SODIUM CHLORIDE 0.9% FLUSH
3.0000 mL | Freq: Two times a day (BID) | INTRAVENOUS | Status: DC
  Administered 2023-02-26 (×2): 3 mL via INTRAVENOUS

## 2023-02-26 MED ORDER — DIPHENHYDRAMINE HCL 25 MG PO CAPS
25.0000 mg | ORAL_CAPSULE | Freq: Every evening | ORAL | Status: DC | PRN
  Administered 2023-02-26: 25 mg via ORAL
  Filled 2023-02-26: qty 1

## 2023-02-26 MED ORDER — MELATONIN 3 MG PO TABS: 3.0000 mg | ORAL_TABLET | Freq: Every day | ORAL | Status: DC

## 2023-02-26 MED ORDER — DILTIAZEM HCL ER COATED BEADS 120 MG PO CP24: 120.0000 mg | ORAL_CAPSULE | Freq: Every day | ORAL | Status: DC

## 2023-02-26 MED ORDER — DILTIAZEM HCL 25 MG/5ML IV SOLN
20.0000 mg | Freq: Once | INTRAVENOUS | Status: AC
  Administered 2023-02-26: 20 mg via INTRAVENOUS
  Filled 2023-02-26: qty 5

## 2023-02-26 MED ORDER — METOPROLOL TARTRATE 50 MG PO TABS
50.0000 mg | ORAL_TABLET | Freq: Two times a day (BID) | ORAL | Status: DC
  Administered 2023-02-26 – 2023-02-27 (×3): 50 mg via ORAL
  Filled 2023-02-26 (×3): qty 1

## 2023-02-26 NOTE — Progress Notes (Signed)
CARDIAC REHAB PHASE I   PRE:  Rate/Rhythm: 94 afib    BP: supine 128/63    SpO2: 93 RA  MODE:  Ambulation: to BR, 10 ft in hall, to recliner, to bed   POST:  Rate/Rhythm: 131 afib    BP: sitting 137/88     SpO2: 94 RA  Pt needed max assist to get to EOB from supine. Stood with elevated surface and to BR for BM. Pt did get himself to standing from low toilet without assist. After clean up and being weighed pt ambulated 10 ft in hall then to recliner on other side of room. Unfortunately pt very uncomfortable in recliner due to rectal pain and had to move back to bed. Mod-max assist with legs getting in bed.   Pt sts he is more weak today than his baseline. He has 4 steps to get in his apartment. He has the ability to order food. C/s PT to help with disposition. Discussed restrictions. Will place referral for [facility] CRPII (would need recumbent machines).  [id number]  Ethelda Chick BS, ACSM-CEP 02/26/2023 10:07 AM

## 2023-02-26 NOTE — Progress Notes (Signed)
Echocardiogram 2D Echocardiogram has been performed.  Lucendia Herrlich 02/26/2023, 8:44 AM

## 2023-02-26 NOTE — Progress Notes (Signed)
1 Day Post-Op Procedure(s) (LRB): Transcatheter Aortic Valve Replacement, Transfemoral (N/A) INTRAOPERATIVE TRANSTHORACIC ECHOCARDIOGRAM (N/A) Subjective: Says he did not sleep much. No pain. Up to chair last night but has not ambulated.  Objective: Vital signs in last 24 hours: Temp:  [96 F (35.6 C)-98.2 F (36.8 C)] 98.2 F (36.8 C) (10/08 2310) Pulse Rate:  [0-129] 101 (10/09 0415) Cardiac Rhythm: Atrial flutter;Bundle branch block (10/08 2026) Resp:  [16-24] 18 (10/09 0545) BP: (83-154)/(53-91) 143/81 (10/09 0415) SpO2:  [90 %-100 %] 90 % (10/09 0415)  Hemodynamic parameters for last 24 hours:    Intake/Output from previous day: 10/08 0701 - 10/09 0700 In: 1677.1 [P.O.:240; I.V.:1250.1; IV Piggyback:187] Out: 730 [Urine:720; Blood:10] Intake/Output this shift: No intake/output data recorded.  General appearance: alert and cooperative Neurologic: intact Heart: regular rate and rhythm, S1, S2 normal, no murmur Lungs: clear to auscultation bilaterally Extremities: no edema Wound: mild ecchymosis right groin but no hematoma. Left groin site looks good.  Lab Results: Recent Labs    02/25/23 1113 02/26/23 0339  WBC  --  9.3  HGB 10.2* 10.0*  HCT 30.0* 33.4*  PLT  --  192   BMET:  Recent Labs    02/25/23 1113 02/26/23 0339  NA 144 136  K 4.0 4.1  CL 108 105  CO2  --  24  GLUCOSE 141* 117*  BUN 17 15  CREATININE 0.80 0.95  CALCIUM  --  8.5*    PT/INR: No results for input(s): "LABPROT", "INR" in the last 72 hours. ABG    Component Value Date/Time   PHART 7.353 02/05/2023 1649   HCO3 23.9 02/05/2023 1654   TCO2 25 02/25/2023 1113   ACIDBASEDEF 2.0 02/05/2023 1654   O2SAT 50 02/05/2023 1654   CBG (last 3)  Recent Labs    02/25/23 1629 02/25/23 2117 02/26/23 0604  GLUCAP 96 114* 110*    Assessment/Plan: S/P Procedure(s) (LRB): Transcatheter Aortic Valve Replacement, Transfemoral (N/A) INTRAOPERATIVE TRANSTHORACIC ECHOCARDIOGRAM  (N/A)  Hemodynamically stable Rhythm looks to be atrial flutter with rate 125 overnight. He was in atrial flutter with controlled rate preop and postop on ECG but sinus a week ago. He received a bolus of cardizem overnight and will give him a bolus this am to see if he slows. Continue po cardizem and Lopressor. Eliquis to resume today.   Ambulate today. He uses a cane or walker at home.   2D echo today.  Plan home tomorrow if heart rate under control.   LOS: 1 day    [name] 02/26/2023

## 2023-02-27 LAB — CBC
HCT: 30.8 % — ABNORMAL LOW (ref 39.0–52.0)
Hemoglobin: 9.5 g/dL — ABNORMAL LOW (ref 13.0–17.0)
MCH: 26.3 pg (ref 26.0–34.0)
MCHC: 30.8 g/dL (ref 30.0–36.0)
MCV: 85.3 fL (ref 80.0–100.0)
Platelets: 140 10*3/uL — ABNORMAL LOW (ref 150–400)
RBC: 3.61 MIL/uL — ABNORMAL LOW (ref 4.22–5.81)
RDW: 16.4 % — ABNORMAL HIGH (ref 11.5–15.5)
WBC: 9.4 10*3/uL (ref 4.0–10.5)
nRBC: 0 % (ref 0.0–0.2)

## 2023-02-27 LAB — BASIC METABOLIC PANEL
Anion gap: 7 (ref 5–15)
BUN: 11 mg/dL (ref 8–23)
CO2: 26 mmol/L (ref 22–32)
Calcium: 8.5 mg/dL — ABNORMAL LOW (ref 8.9–10.3)
Chloride: 104 mmol/L (ref 98–111)
Creatinine, Ser: 0.91 mg/dL (ref 0.61–1.24)
GFR, Estimated: >60 mL/min (ref >60)
Glucose, Bld: 122 mg/dL — ABNORMAL HIGH (ref 70–99)
Potassium: 3.9 mmol/L (ref 3.5–5.1)
Sodium: 137 mmol/L (ref 135–145)

## 2023-02-27 LAB — GLUCOSE, CAPILLARY: Glucose-Capillary: 128 mg/dL — ABNORMAL HIGH (ref 70–99)

## 2023-02-27 NOTE — TOC Initial Note (Signed)
Transition of Care [facility]) - Initial/Assessment Note    Patient Details  Name: Gary Carroll MRN: [id number] Date of Birth: 30-Jan-1946  Transition of Care [facility]) CM/SW Contact:    Leone Haven, RN Phone Number: 02/27/2023, 8:47 AM  Clinical Narrative:                 From home alone, has PCP and insurance on file, states has no HH services in place at this time , walker at home.  States friend will transport them home at [employer]. states gets medications from [place]. Pta self ambulatory with walker.  He goes to [name].   Expected Discharge Plan: Home/Self Care Barriers to Discharge: No Barriers Identified   Patient Goals and CMS Choice Patient states their goals for this hospitalization and ongoing recovery are:: return home   Choice offered to / list presented to : NA      Expected Discharge Plan and Services In-house Referral: NA Discharge Planning Services: CM Consult Post Acute Care Choice: NA Living arrangements for the past 2 months: Single Family Home Expected Discharge Date: 02/27/23               DME Arranged: N/A DME Agency: NA       HH Arranged: NA          Prior Living Arrangements/Services Living arrangements for the past 2 months: Single Family Home Lives with:: Self Patient language and need for interpreter reviewed:: Yes Do you feel safe going back to the place where you live?: Yes      Need for Family Participation in Patient Care: No (Comment) Care giver support system in place?: No (comment) Current home services: DME (walker) Criminal Activity/Legal Involvement Pertinent to Current Situation/Hospitalization: No - Comment as needed  Activities of Daily Living   ADL Screening (condition at time of admission) Independently performs ADLs?: Yes (appropriate for developmental age) Is the patient deaf or have difficulty hearing?: No Does the patient have difficulty seeing, even when wearing glasses/contacts?: No Does the  patient have difficulty concentrating, remembering, or making decisions?: No  Permission Sought/Granted Permission sought to share information with : Case Manager Permission granted to share information with : Yes, Verbal Permission Granted              Emotional Assessment   Attitude/Demeanor/Rapport: Engaged Affect (typically observed): Appropriate Orientation: : Oriented to Place, Oriented to Self, Oriented to  Time, Oriented to Situation Alcohol / Substance Use: Not Applicable Psych Involvement: No (comment)  Admission diagnosis:  S/P TAVR (transcatheter aortic valve replacement) [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] Patient Active Problem List   Diagnosis Date Noted   CAD (coronary artery disease) 02/25/2023   (HFpEF) heart failure with preserved ejection fraction (HCC) 02/25/2023   Adenocarcinoma, colon (HCC) 02/25/2023   Severe aortic stenosis 02/25/2023   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   Nonrheumatic aortic valve stenosis 02/05/2023   Acute heart failure with preserved ejection fraction (HFpEF) (HCC) 01/20/2023   SOB (shortness of breath) 12/23/2022   Acute respiratory failure with hypoxia (HCC) 12/21/2022   Elevated troponin 12/21/2022   Atrial fibrillation with RVR (HCC) 12/21/2022   Rectal pain 12/04/2022   Rectal bleed 12/04/2022   CAP (community acquired pneumonia) 06/03/2022   Obstructive lung disease (generalized) (HCC) 11/29/2021   Restrictive lung disease 06/26/2021   Lung nodules 04/30/2021   Chronic anticoagulation 03/14/2021   Aortic stenosis 03/14/2021   Pressure injury of skin 02/03/2021  Septic shock (HCC)    Acute respiratory failure with hypoxia and hypercapnia (HCC)    Acute pulmonary edema (HCC)    Abnormal CT of the chest 10/23/2020   History of tobacco use 10/23/2020   Chronic knee pain 03/19/2018   Chronic postoperative pain 03/19/2018   Elevated transaminase level    Sleep apnea  10/01/2017   BPH (benign prostatic hyperplasia) 02/04/2017   Urinary tract infection 02/04/2017   Cellulitis of left lower extremity 02/03/2017   GERD (gastroesophageal reflux disease) 02/03/2017   AF (paroxysmal atrial fibrillation) (HCC) 02/03/2017   Sepsis due to cellulitis (HCC) 02/03/2017   New onset atrial fibrillation (HCC) 08/24/2015   Diuresis excessive 08/22/2015   Bladder outlet obstruction 08/22/2015   Hydronephrosis determined by ultrasound 08/22/2015   AKI (acute kidney injury) (HCC) 08/22/2015   Hypernatremia 08/22/2015   Acute blood loss anemia 08/22/2015   Hematuria 08/22/2015   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 [place] [address] Phone: [phone] Fax: [phone]  [id number] - [place], [place] - [address] AT [facility] [address] [place] [address] Phone: [phone] Fax: [phone]  [facility] - [place], [place] - [address] [place] [address] Phone: [phone] Fax: [phone]     Social Determinants of Health (SDOH) Social History: SDOH Screenings   Food Insecurity: No Food Insecurity (02/05/2023)  Housing: Patient Declined (02/05/2023)  Transportation Needs: No Transportation Needs (02/05/2023)  Utilities: Not At Risk (02/05/2023)  Alcohol Screen: Low Risk  (02/12/2023)  Depression (PHQ2-9): Low Risk  (02/12/2023)  Tobacco Use:  Medium Risk (02/25/2023)   SDOH Interventions:     Readmission Risk Interventions    02/07/2021    1:55 PM  Readmission Risk Prevention Plan  Transportation Screening Complete  PCP or Specialist Appt within 5-7 Days Complete  Home Care Screening Complete  Medication Review (RN CM) Referral to Pharmacy

## 2023-02-27 NOTE — Plan of Care (Signed)
POC progressing.  

## 2023-02-27 NOTE — Evaluation (Signed)
Physical Therapy Evaluation Patient Details Name: TYRIE MAJID MRN: [id number] DOB: [birth year] Today's Date: 02/27/2023  History of Present Illness  Pt is a 77 y/o male admitted 10/8 with severe Aortic Stenosis for a planned TAVR procedure.  PMHx:  Afib, arthritis, CAD, DM, HLD, HTN, sleep apnea, B TKA, Heart cath  Clinical Impression  Pt is hopefully not at baseline functioning with pt's weakness making it difficult for him to stand from lower/average height surfaces.  I recommended to him foregoing the can in favor of the RW for a while, call neighbors to have them on standby for safety.  He should be minimally safe at home with PRN assist/check ins from neighbors. There are no further acute PT needs, but this patient needs HHPT/OPPT for weakness and deconditioning.   Will sign off at this time.         If plan is discharge home, recommend the following: A little help with walking and/or transfers;A little help with bathing/dressing/bathroom;Assistance with cooking/housework;Assist for transportation;Help with stairs or ramp for entrance   Can travel by private vehicle        Equipment Recommendations None recommended by PT  Recommendations for Other Services       Functional Status Assessment Patient has had a recent decline in their functional status and demonstrates the ability to make significant improvements in function in a reasonable and predictable amount of time.     Precautions / Restrictions Precautions Precautions: Fall      Mobility  Bed Mobility Overal bed mobility:  (pt on EOB on arrival and did not get back in bed.)                  Transfers Overall transfer level: Needs assistance   Transfers: Sit to/from Stand Sit to Stand: Contact guard assist           General transfer comment: cues for hand placement,  did not help with standing, but gave verbal/demo cues for assist coming more forward with boost.  very effortful stand from bed  height.    Ambulation/Gait Ambulation/Gait assistance: Contact guard assist Gait Distance (Feet): 30 Feet Assistive device: Rolling walker (2 wheels) Gait Pattern/deviations: Step-through pattern   Gait velocity interpretation: <1.31 ft/sec, indicative of household ambulator Pre-gait activities: stood for 4-5 min at toilet for self cath.  No AD support at times. General Gait Details: short, low amplitude steps in the RW.  Improved steadiness after up for a few minutes.  Stairs Stairs: Yes Stairs assistance: Contact guard assist, Min assist Stair Management: One rail Right, With cane Number of Stairs: 4 General stair comments: Very effortful, with rail and cane.  Gave minimal assist on 1 steps to determine if assist made the ascent safer.  Wheelchair Mobility     Tilt Bed    Modified Rankin (Stroke Patients Only)       Balance Overall balance assessment: Mild deficits observed, not formally tested                                           Pertinent Vitals/Pain Pain Assessment Pain Assessment: Faces Faces Pain Scale: No hurt Pain Intervention(s): Monitored during session    Home Living Family/patient expects to be discharged to:: Private residence Living Arrangements: Alone Available Help at Discharge: Friend(s);Available PRN/intermittently Type of Home: Apartment Home Access: Stairs to enter Entrance Stairs-Rails: Right;Left Entrance Stairs-Number of Steps:  4   Home Layout: One level Home Equipment: Agricultural consultant (2 wheels);BSC/3in1;Shower seat      Prior Function Prior Level of Function : Independent/Modified Independent                     Extremity/Trunk Assessment   Upper Extremity Assessment Upper Extremity Assessment: Generalized weakness;Overall [facility] for tasks assessed    Lower Extremity Assessment Lower Extremity Assessment: Generalized weakness    Cervical / Trunk Assessment Cervical / Trunk Assessment: Kyphotic   Communication   Communication Communication: No apparent difficulties  Cognition Arousal: Alert Behavior During Therapy: WFL for tasks assessed/performed Overall Cognitive Status: No family/caregiver present to determine baseline cognitive functioning                                          General Comments      Exercises     Assessment/Plan    PT Assessment All further PT needs can be met in the next venue of care  PT Problem List Decreased strength;Decreased activity tolerance;Decreased mobility;Decreased balance;Decreased knowledge of use of DME       PT Treatment Interventions      PT Goals (Current goals can be found in the Care Plan section)  Acute Rehab PT Goals Patient Stated Goal: get back to my swimming PT Goal Formulation: All assessment and education complete, DC therapy    Frequency       Co-evaluation               AM-PAC PT "6 Clicks" Mobility  Outcome Measure Help needed turning from your back to your side while in a flat bed without using bedrails?: None Help needed moving from lying on your back to sitting on the side of a flat bed without using bedrails?: A Little Help needed moving to and from a bed to a chair (including a wheelchair)?: A Little Help needed standing up from a chair using your arms (e.g., wheelchair or bedside chair)?: A Little Help needed to walk in hospital room?: A Little Help needed climbing 3-5 steps with a railing? : A Little 6 Click Score: 19    End of Session   Activity Tolerance: Patient tolerated treatment well Patient left: in chair;with call bell/phone within reach Nurse Communication: Mobility status PT Visit Diagnosis: Other abnormalities of gait and mobility (R26.89);Muscle weakness (generalized) (M62.81);Difficulty in walking, not elsewhere classified (R26.2)    Time: 1610-9604 PT Time Calculation (min) (ACUTE ONLY): 36 min   Charges:   PT Evaluation $PT Eval Moderate Complexity: 1  Mod PT Treatments $Gait Training: 8-22 mins PT General Charges $$ ACUTE PT VISIT: 1 Visit         02/27/2023  Jacinto Halim., PT Acute Rehabilitation Services [phone]  (office)  Eliseo Gum Gordon Vandunk 02/27/2023, 12:53 PM

## 2023-02-27 NOTE — Plan of Care (Signed)
?  Problem: Education: ?Goal: Understanding of CV disease, CV risk reduction, and recovery process will improve ?Outcome: Adequate for Discharge ?Goal: Individualized Educational Video(s) ?Outcome: Adequate for Discharge ?  ?Problem: Activity: ?Goal: Ability to return to baseline activity level will improve ?Outcome: Adequate for Discharge ?  ?Problem: Cardiovascular: ?Goal: Ability to achieve and maintain adequate cardiovascular perfusion will improve ?Outcome: Adequate for Discharge ?Goal: Vascular access site(s) Level 0-1 will be maintained ?Outcome: Adequate for Discharge ?  ?Problem: Health Behavior/Discharge Planning: ?Goal: Ability to safely manage health-related needs after discharge will improve ?Outcome: Adequate for Discharge ?  ?

## 2023-02-27 NOTE — TOC Transition Note (Signed)
Transition of Care [facility]) - CM/SW Discharge Note   Patient Details  Name: Gary Carroll MRN: [id number] Date of Birth: [birth year]  Transition of Care [facility]) CM/SW Contact:  Leone Haven, RN Phone Number: 02/27/2023, 8:48 AM   Clinical Narrative:    For dc, has no needs.   Final next level of care: Home/Self Care Barriers to Discharge: No Barriers Identified   Patient Goals and CMS Choice   Choice offered to / list presented to : NA  Discharge Placement                         Discharge Plan and Services Additional resources added to the After Visit Summary for   In-house Referral: NA Discharge Planning Services: CM Consult Post Acute Care Choice: NA          DME Arranged: N/A DME Agency: NA       HH Arranged: NA          Social Determinants of Health (SDOH) Interventions SDOH Screenings   Food Insecurity: No Food Insecurity (02/05/2023)  Housing: Patient Declined (02/05/2023)  Transportation Needs: No Transportation Needs (02/05/2023)  Utilities: Not At Risk (02/05/2023)  Alcohol Screen: Low Risk  (02/12/2023)  Depression (PHQ2-9): Low Risk  (02/12/2023)  Tobacco Use: Medium Risk (02/25/2023)     Readmission Risk Interventions    02/07/2021    1:55 PM  Readmission Risk Prevention Plan  Transportation Screening Complete  PCP or Specialist Appt within 5-7 Days Complete  Home Care Screening Complete  Medication Review (RN CM) Referral to Pharmacy

## 2023-02-27 NOTE — Progress Notes (Signed)
PT was consulted to evaluate prior to discharging. According PT the patient needs HHPT/OT with trans to outpatient Rehab.  Will discharge after working with PT and HH is arranged.  Orvan Seen SWOT RN

## 2023-02-27 NOTE — Progress Notes (Signed)
Explained discharge instructions to patient. Reviewed follow up appointment and next medication administration times. Also reviewed education. Patient verbalized having an understanding for instructions given. All belongings are in the patient's possession. IV and telemetry were removed. CCMD was notified. No other needs verbalized. Will transport downstairs for discharge when ride arrives.

## 2023-02-27 NOTE — Consult Note (Signed)
[facility] Customer service manager [THN]  Accountable Care Organization [ACO] Patient: Gary Carroll Medicare  Primary Care Provider: Benita Stabile, MD  this provider is listed for the transition of care follow up appointments    [facility] Liaison screened the patient remotely at [facility].  Call the phone at hospital bedside to follow up on community care needs.    The patient was screened for 30 day readmission hospitalization with noted high risk score for unplanned readmission risk 4 hospital admissions in 6 months. This was a scheduled admission for TAVR noted.  The patient was assessed for potential [facility]) Care Management service needs for post hospital transition for care coordination. Review of patient's electronic medical record reveals patient is for home.   Plan:  Referral request for community care coordination: Arrange a post hospital follow up call for readmission prevention measures.   Community Care Management/Population Health does not replace or interfere with any arrangements made by the Inpatient Transition of Care team.   For questions contact:   Charlesetta Shanks, RN, BSN, CCM [place]  [facility] Liaison Direct Dial: [phone] or secure chat Website: [name]         [facility] CM Inpatient Consult   02/27/2023  [name] December 09, 1945 [id number]

## 2023-02-27 NOTE — Progress Notes (Signed)
Discharge:   Discharge summary reviewed with patient.  IV access discontinued. Patient assisted to private vehicle via staff.

## 2023-02-27 NOTE — Progress Notes (Signed)
CARDIAC REHAB PHASE I    Education completed. Referral sent to [facility]. Plan for home today.    Woodroe Chen, RN BSN 02/27/2023 9:58 AM

## 2023-02-27 NOTE — TOC Transition Note (Signed)
Transition of Care (TOC) - CM/SW Discharge Note Donn Pierini RN, BSN Transitions of Care Unit 4E- RN Case Manager See Treatment Team for direct phone #   Patient Details  Name: Gary Carroll MRN: [id number] Date of Birth: 07-Jun-1945  Transition of Care [facility]) CM/SW Contact:  Darrold Span, RN Phone Number: 02/27/2023, 11:39 AM   Clinical Narrative:    Pt stable for transition home, after PT eval- recommendations made for [facility]- orders placed for HPP/OT-  CM spoke with pt, ride is already waiting downstairs.  Choice offered for [facility] needs- pt voiced that he has had HH in past for his knee replacement- does not remember name- per review in [facility]- pt has used [facility]- pt agreeable to use [facility] again does not have preference.   Phone # confirmed [phone], pt confirmed his PCP is at the [facility], pt states he would like his HH to go through the [place], he also has [facility].   Call made to [facility] With [facility]- referral has been accepted for HHPTOT- Morrie Sheldon will send in to the [place] to get auth for services.   No further TOC needs noted.    Final next level of care: Home w Home Health Services Barriers to Discharge: No Barriers Identified   Patient Goals and CMS Choice CMS Medicare.gov Compare Post Acute Care list provided to:: Patient Choice offered to / list presented to : Patient  Discharge Placement               Home w/ [facility]          Discharge Plan and Services Additional resources added to the After Visit Summary for   In-house Referral: NA Discharge Planning Services: CM Consult Post Acute Care Choice: Home Health          DME Arranged: N/A DME Agency: NA       HH Arranged: PT, OT HH Agency: [facility] Date HH Agency Contacted: 02/27/23 Time HH Agency Contacted: 1138 Representative spoke with at [facility] Agency: Duwaine Maxin  Social Determinants of Health (SDOH) Interventions SDOH Screenings   Food  Insecurity: No Food Insecurity (02/05/2023)  Housing: Patient Declined (02/05/2023)  Transportation Needs: No Transportation Needs (02/05/2023)  Utilities: Not At Risk (02/05/2023)  Alcohol Screen: Low Risk  (02/12/2023)  Depression (PHQ2-9): Low Risk  (02/12/2023)  Tobacco Use: Medium Risk (02/25/2023)     Readmission Risk Interventions    02/27/2023   11:39 AM 02/07/2021    1:55 PM  Readmission Risk Prevention Plan  Transportation Screening Complete Complete  PCP or Specialist Appt within 5-7 Days  Complete  Home Care Screening  Complete  Medication Review (RN CM)  Referral to Pharmacy  HRI or Home Care Consult Complete   Social Work Consult for Recovery Care Planning/Counseling Complete   Palliative Care Screening Not Applicable   Medication Review [employer]) Complete

## 2023-02-28 ENCOUNTER — Telehealth: Payer: Self-pay | Admitting: *Deleted

## 2023-02-28 ENCOUNTER — Telehealth: Payer: Self-pay

## 2023-02-28 NOTE — Telephone Encounter (Signed)
Patient contacted regarding discharge from [facility] on 02/27/2023.  Patient understands to follow up with provider Georgie Chard NP on 03/05/2023 at 9:55 AM at [facility] location. Patient understands discharge instructions? yes Patient understands medications and regiment? yes Patient understands to bring all medications to this visit? yes

## 2023-02-28 NOTE — Progress Notes (Signed)
Care Coordination   Note   02/28/2023 Name: Gary Carroll MRN: [id number] DOB: Jun 28, 1945  Gary Carroll is a 77 y.o. year old male who sees Margo Aye, Kathleene Hazel, MD for primary care. I reached out to [name] by phone today to offer care coordination services.  [name] was given information about Care Coordination services today including:   The Care Coordination services include support from the care team which includes your Nurse Coordinator, Clinical Social Worker, or Pharmacist.  The Care Coordination team is here to help remove barriers to the health concerns and goals most important to you. Care Coordination services are voluntary, and the patient may decline or stop services at any time by request to their care team member.   Care Coordination Consent Status: Patient agreed to services and verbal consent obtained.   Follow up plan:  Telephone appointment with care coordination team member scheduled for:  03/07/23  Encounter Outcome:  Patient Scheduled  [facility] Coordination Care Guide  Direct Dial: [phone]

## 2023-03-01 DIAGNOSIS — Z510 Encounter for antineoplastic radiation therapy: Secondary | ICD-10-CM | POA: Diagnosis present

## 2023-03-01 DIAGNOSIS — C20 Malignant neoplasm of rectum: Secondary | ICD-10-CM | POA: Diagnosis present

## 2023-03-03 ENCOUNTER — Ambulatory Visit: Payer: No Typology Code available for payment source | Admitting: Radiation Oncology

## 2023-03-04 ENCOUNTER — Other Ambulatory Visit: Payer: Self-pay

## 2023-03-04 ENCOUNTER — Ambulatory Visit
Admission: RE | Admit: 2023-03-04 | Discharge: 2023-03-04 | Discharge status: Home / Self Care | Payer: No Typology Code available for payment source | Source: Ambulatory Visit | Attending: Radiation Oncology

## 2023-03-04 DIAGNOSIS — Z510 Encounter for antineoplastic radiation therapy: Secondary | ICD-10-CM | POA: Diagnosis not present

## 2023-03-04 LAB — RAD ONC ARIA SESSION SUMMARY
Course Elapsed Days: 0
Plan Fractions Treated to Date: 1
Plan Prescribed Dose Per Fraction: 1.8 Gy
Plan Total Fractions Prescribed: 25
Plan Total Prescribed Dose: 45 Gy
Reference Point Dosage Given to Date: 1.8 Gy
Reference Point Session Dosage Given: 1.8 Gy
Session Number: 1

## 2023-03-04 NOTE — Progress Notes (Unsigned)
HEART AND VASCULAR CENTER   MULTIDISCIPLINARY HEART VALVE TEAM  Structural Heart Office Note:  .   Date:  03/04/2023  ID:  Gary Carroll, DOB [birth year], MRN [id number] PCP: Benita Stabile, MD  [facility] Providers Cardiologist:  None { Click to update primary MD,subspecialty MD or APP then REFRESH:1}    History of Present Illness: .   Gary Carroll is a 77 y.o. 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    Mr. Bevins is followed at the [place]. He recently developed progressive DOE and fatigue. Echocardiogram performed 12/21/22 showed EF 60-65%, mod LVH, mild AR, and severe AS with a mean gradient of 41 mmHg. He was referred to Dr. Lynnette Caffey for structural heart consultation and set up for diagnostic L/RHC on 02/05/23. This showed a high-grade proximal 80% LAD lesion with otherwise mild to moderate diffuse disease. Medical therapy was recommended given the lack of angina.    His situation was further complicated by the fact that he was recently diagnosed with rectal adenocarcinoma and will require chemo and radiation therapy.    He was then evaluated by the multidisciplinary valve team and felt to have severe, symptomatic aortic stenosis and to be a suitable candidate for TAVR, which was set up for 02/25/2023.      Severe AS: s/p successful TAVR with a 26 mm Edwards Sapien 3 THV via the TF  approach on 02/25/2023. Post operative echo with stable TAVR valve function with a mean gradient at , AVA 2.32cm3, and DI at 0.47. Groin sites are stable. He was kept an additional night given AF with RVR that has since been controlled with home therapies. ECG with no high grade heart block. Patient was resumed on home Eliquis  monotherapy POD1. He will require lifelong dental SBE. This will be discussed at [facility] follow up next week. He ambulated with CRI with no issues and has been recommended for CRII. Plan close follow up with our team. Will discuss establishment with Dr. Dominga Ferry in [place] for general cardiology.    Chronic HFpEF: Stable with procedural LVEDP at . Resumed on home Lasix at discharge.    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 Post procedure rates were in the 120-140 range at times. He was resumed on home diltiazem and metoprolol that have since improved rates to the 60's. Continue Eliquis, diltiazem, and metoprolol. Consider DCCV if patient remains in flutter at follow up.    CAD: Pre TAVR cath with high grade 80% LAD stenosis with otherwise diffuse disease. Plan to continue medical management given no anginal symptoms. No ASA given OAC. Continue metoprolol, crestor.    Rectal adenocarcinoma: Recently dx and followed by radiation oncology with plans to begin therapy after TAVR.    Incidental findings: Circumferential wall thickening of the rectum, consistent with known malignancy. Unchanged enlarged mediastinal, hilar, periportal, retroperitoneal, pelvic and bilateral inguinal lymph nodes. Small bilateral pleural effusions and mild diffuse ground-glass opacities, likely due to pulmonary edema. Unchanged linear nodular opacities of the left upper lobe.      Studies Reviewed: .   Cardiac Studies & Procedures   CARDIAC CATHETERIZATION  CARDIAC CATHETERIZATION 02/05/2023  Narrative   3rd Mrg-1 lesion is 70% stenosed.   3rd  Mrg-2 lesion is 70% stenosed.   Prox LAD lesion is 80% stenosed.  1.  High-grade proximal LAD lesion with mild to moderate disease elsewhere; given the lack of exertional angina medical therapy will be pursued. 2.  Fick cardiac output of 5.3 L/min and Fick cardiac index of 2.3 L/min/m with the following hemodynamics: Right atrial pressure mean of 10  mmHg Right ventricular pressure 53/-1 with an end-diastolic pressure of 14 mmHg Wedge pressure mean of 26 mmHg Pulmonary artery pressure 57/21 with a mean of 30 mmHg Pulmonary vascular resistance of 0.75 Woods units Pulmonary artery pulsatility index of 3.6 3.  Capacious iliofemoral vessels bilaterally.  Recommendation: Increase Lasix to 40 mg twice daily and continue evaluation for aortic valve intervention.  Findings Coronary Findings Diagnostic  Dominance: Right  Left Anterior Descending The vessel exhibits minimal luminal irregularities. Prox LAD lesion is 80% stenosed.  Left Circumflex  Third Obtuse Marginal Branch 3rd Mrg-1 lesion is 70% stenosed. 3rd Mrg-2 lesion is 70% stenosed.  Right Coronary Artery There is mild diffuse disease throughout the vessel.  Intervention  No interventions have been documented.     ECHOCARDIOGRAM  ECHOCARDIOGRAM COMPLETE 02/26/2023  Narrative ECHOCARDIOGRAM REPORT    Patient Name:   Gary Carroll Date of Exam: 02/26/2023 Medical Rec #:  [id number]          Height:       70.0 in Accession #:    [id number]         Weight:       261.0 lb Date of Birth:  1946/03/15         BSA:          2.337 m Patient Age:    76 years           BP:           143/81 mmHg Patient Gender: M                  HR:           125 bpm. Exam Location:  Inpatient  Procedure: 2D Echo, Cardiac Doppler and Color Doppler  Indications:     Post TAVR evaluation V43.3/Z95.2  History:         Patient has prior history of Echocardiogram examinations, most recent 02/25/2023. CHF, CAD, Arrythmias:Atrial Fibrillation, Signs/Symptoms:Shortness of Breath; Risk Factors:Hypertension, Sleep Apnea and Current Smoker. Aortic Valve: 26 mm Edwards Sapien prosthetic, stented (TAVR) valve is present in the aortic position. Procedure Date: 02/25/23.  Sonographer:     Lucendia Herrlich RCS Referring Phys:  [phone] Wanza Szumski D Tericka Devincenzi Diagnosing Phys: Weston Brass  MD  IMPRESSIONS   1. Left ventricular ejection fraction, by estimation, is 60%. The left ventricle has normal function. Left ventricular endocardial border not optimally defined to evaluate regional wall motion, however RWMA that are seen are noted in Findings. There is moderate left ventricular hypertrophy. Left ventricular diastolic parameters are indeterminate. 2. Right ventricular systolic function is mildly reduced. The right ventricular size is normal. There is normal pulmonary artery systolic pressure. The estimated right ventricular systolic pressure is 24.0 mmHg. 3. The mitral valve is degenerative. Trivial mitral valve regurgitation. No evidence of mitral stenosis. Moderate mitral annular calcification. 4. The aortic valve has been repaired/replaced. Aortic valve regurgitation is not visualized, no paravalvular leak. 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 Procedure Date: 02/25/23. Echo findings are consistent with normal structure and function of the aortic valve prosthesis. Aortic valve  [facility] AND VASCULAR CENTER   MULTIDISCIPLINARY HEART VALVE TEAM  Structural Heart Office Note:  .   Date:  03/04/2023  ID:  Gary Carroll, DOB [birth year], MRN [id number] PCP: Benita Stabile, MD  [facility] Providers Cardiologist:  None { Click to update primary MD,subspecialty MD or APP then REFRESH:1}    History of Present Illness: .   Gary Carroll is a 77 y.o. 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    Mr. Bevins is followed at the [place]. He recently developed progressive DOE and fatigue. Echocardiogram performed 12/21/22 showed EF 60-65%, mod LVH, mild AR, and severe AS with a mean gradient of 41 mmHg. He was referred to Dr. Lynnette Caffey for structural heart consultation and set up for diagnostic L/RHC on 02/05/23. This showed a high-grade proximal 80% LAD lesion with otherwise mild to moderate diffuse disease. Medical therapy was recommended given the lack of angina.    His situation was further complicated by the fact that he was recently diagnosed with rectal adenocarcinoma and will require chemo and radiation therapy.    He was then evaluated by the multidisciplinary valve team and felt to have severe, symptomatic aortic stenosis and to be a suitable candidate for TAVR, which was set up for 02/25/2023.      Severe AS: s/p successful TAVR with a 26 mm Edwards Sapien 3 THV via the TF  approach on 02/25/2023. Post operative echo with stable TAVR valve function with a mean gradient at , AVA 2.32cm3, and DI at 0.47. Groin sites are stable. He was kept an additional night given AF with RVR that has since been controlled with home therapies. ECG with no high grade heart block. Patient was resumed on home Eliquis  monotherapy POD1. He will require lifelong dental SBE. This will be discussed at [facility] follow up next week. He ambulated with CRI with no issues and has been recommended for CRII. Plan close follow up with our team. Will discuss establishment with Dr. Dominga Ferry in [place] for general cardiology.    Chronic HFpEF: Stable with procedural LVEDP at . Resumed on home Lasix at discharge.    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 Post procedure rates were in the 120-140 range at times. He was resumed on home diltiazem and metoprolol that have since improved rates to the 60's. Continue Eliquis, diltiazem, and metoprolol. Consider DCCV if patient remains in flutter at follow up.    CAD: Pre TAVR cath with high grade 80% LAD stenosis with otherwise diffuse disease. Plan to continue medical management given no anginal symptoms. No ASA given OAC. Continue metoprolol, crestor.    Rectal adenocarcinoma: Recently dx and followed by radiation oncology with plans to begin therapy after TAVR.    Incidental findings: Circumferential wall thickening of the rectum, consistent with known malignancy. Unchanged enlarged mediastinal, hilar, periportal, retroperitoneal, pelvic and bilateral inguinal lymph nodes. Small bilateral pleural effusions and mild diffuse ground-glass opacities, likely due to pulmonary edema. Unchanged linear nodular opacities of the left upper lobe.      Studies Reviewed: .   Cardiac Studies & Procedures   CARDIAC CATHETERIZATION  CARDIAC CATHETERIZATION 02/05/2023  Narrative   3rd Mrg-1 lesion is 70% stenosed.   3rd  HEART AND VASCULAR CENTER   MULTIDISCIPLINARY HEART VALVE TEAM  Structural Heart Office Note:  .   Date:  03/04/2023  ID:  Gary Carroll, DOB [birth year], MRN [id number] PCP: Benita Stabile, MD  [facility] Providers Cardiologist:  None { Click to update primary MD,subspecialty MD or APP then REFRESH:1}    History of Present Illness: .   Gary Carroll is a 77 y.o. 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    Mr. Bevins is followed at the [place]. He recently developed progressive DOE and fatigue. Echocardiogram performed 12/21/22 showed EF 60-65%, mod LVH, mild AR, and severe AS with a mean gradient of 41 mmHg. He was referred to Dr. Lynnette Caffey for structural heart consultation and set up for diagnostic L/RHC on 02/05/23. This showed a high-grade proximal 80% LAD lesion with otherwise mild to moderate diffuse disease. Medical therapy was recommended given the lack of angina.    His situation was further complicated by the fact that he was recently diagnosed with rectal adenocarcinoma and will require chemo and radiation therapy.    He was then evaluated by the multidisciplinary valve team and felt to have severe, symptomatic aortic stenosis and to be a suitable candidate for TAVR, which was set up for 02/25/2023.      Severe AS: s/p successful TAVR with a 26 mm Edwards Sapien 3 THV via the TF  approach on 02/25/2023. Post operative echo with stable TAVR valve function with a mean gradient at , AVA 2.32cm3, and DI at 0.47. Groin sites are stable. He was kept an additional night given AF with RVR that has since been controlled with home therapies. ECG with no high grade heart block. Patient was resumed on home Eliquis  monotherapy POD1. He will require lifelong dental SBE. This will be discussed at [facility] follow up next week. He ambulated with CRI with no issues and has been recommended for CRII. Plan close follow up with our team. Will discuss establishment with Dr. Dominga Ferry in [place] for general cardiology.    Chronic HFpEF: Stable with procedural LVEDP at . Resumed on home Lasix at discharge.    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 Post procedure rates were in the 120-140 range at times. He was resumed on home diltiazem and metoprolol that have since improved rates to the 60's. Continue Eliquis, diltiazem, and metoprolol. Consider DCCV if patient remains in flutter at follow up.    CAD: Pre TAVR cath with high grade 80% LAD stenosis with otherwise diffuse disease. Plan to continue medical management given no anginal symptoms. No ASA given OAC. Continue metoprolol, crestor.    Rectal adenocarcinoma: Recently dx and followed by radiation oncology with plans to begin therapy after TAVR.    Incidental findings: Circumferential wall thickening of the rectum, consistent with known malignancy. Unchanged enlarged mediastinal, hilar, periportal, retroperitoneal, pelvic and bilateral inguinal lymph nodes. Small bilateral pleural effusions and mild diffuse ground-glass opacities, likely due to pulmonary edema. Unchanged linear nodular opacities of the left upper lobe.      Studies Reviewed: .   Cardiac Studies & Procedures   CARDIAC CATHETERIZATION  CARDIAC CATHETERIZATION 02/05/2023  Narrative   3rd Mrg-1 lesion is 70% stenosed.   3rd  Mrg-2 lesion is 70% stenosed.   Prox LAD lesion is 80% stenosed.  1.  High-grade proximal LAD lesion with mild to moderate disease elsewhere; given the lack of exertional angina medical therapy will be pursued. 2.  Fick cardiac output of 5.3 L/min and Fick cardiac index of 2.3 L/min/m with the following hemodynamics: Right atrial pressure mean of 10  mmHg Right ventricular pressure 53/-1 with an end-diastolic pressure of 14 mmHg Wedge pressure mean of 26 mmHg Pulmonary artery pressure 57/21 with a mean of 30 mmHg Pulmonary vascular resistance of 0.75 Woods units Pulmonary artery pulsatility index of 3.6 3.  Capacious iliofemoral vessels bilaterally.  Recommendation: Increase Lasix to 40 mg twice daily and continue evaluation for aortic valve intervention.  Findings Coronary Findings Diagnostic  Dominance: Right  Left Anterior Descending The vessel exhibits minimal luminal irregularities. Prox LAD lesion is 80% stenosed.  Left Circumflex  Third Obtuse Marginal Branch 3rd Mrg-1 lesion is 70% stenosed. 3rd Mrg-2 lesion is 70% stenosed.  Right Coronary Artery There is mild diffuse disease throughout the vessel.  Intervention  No interventions have been documented.     ECHOCARDIOGRAM  ECHOCARDIOGRAM COMPLETE 02/26/2023  Narrative ECHOCARDIOGRAM REPORT    Patient Name:   Gary Carroll Date of Exam: 02/26/2023 Medical Rec #:  [id number]          Height:       70.0 in Accession #:    [id number]         Weight:       261.0 lb Date of Birth:  1946/03/15         BSA:          2.337 m Patient Age:    76 years           BP:           143/81 mmHg Patient Gender: M                  HR:           125 bpm. Exam Location:  Inpatient  Procedure: 2D Echo, Cardiac Doppler and Color Doppler  Indications:     Post TAVR evaluation V43.3/Z95.2  History:         Patient has prior history of Echocardiogram examinations, most recent 02/25/2023. CHF, CAD, Arrythmias:Atrial Fibrillation, Signs/Symptoms:Shortness of Breath; Risk Factors:Hypertension, Sleep Apnea and Current Smoker. Aortic Valve: 26 mm Edwards Sapien prosthetic, stented (TAVR) valve is present in the aortic position. Procedure Date: 02/25/23.  Sonographer:     Lucendia Herrlich RCS Referring Phys:  [phone] Wanza Szumski D Tericka Devincenzi Diagnosing Phys: Weston Brass  MD  IMPRESSIONS   1. Left ventricular ejection fraction, by estimation, is 60%. The left ventricle has normal function. Left ventricular endocardial border not optimally defined to evaluate regional wall motion, however RWMA that are seen are noted in Findings. There is moderate left ventricular hypertrophy. Left ventricular diastolic parameters are indeterminate. 2. Right ventricular systolic function is mildly reduced. The right ventricular size is normal. There is normal pulmonary artery systolic pressure. The estimated right ventricular systolic pressure is 24.0 mmHg. 3. The mitral valve is degenerative. Trivial mitral valve regurgitation. No evidence of mitral stenosis. Moderate mitral annular calcification. 4. The aortic valve has been repaired/replaced. Aortic valve regurgitation is not visualized, no paravalvular leak. 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 Procedure Date: 02/25/23. Echo findings are consistent with normal structure and function of the aortic valve prosthesis. Aortic valve

## 2023-03-05 ENCOUNTER — Ambulatory Visit
Admission: RE | Admit: 2023-03-05 | Discharge: 2023-03-05 | Disposition: A | Discharge status: Home / Self Care | Payer: No Typology Code available for payment source | Source: Ambulatory Visit | Attending: Radiation Oncology | Admitting: Radiation Oncology

## 2023-03-05 ENCOUNTER — Other Ambulatory Visit: Payer: Self-pay

## 2023-03-05 ENCOUNTER — Ambulatory Visit: Payer: No Typology Code available for payment source | Attending: Cardiology | Admitting: Cardiology

## 2023-03-05 VITALS — BP 120/80 | HR 91 | Ht 71.0 in | Wt 256.2 lb

## 2023-03-05 DIAGNOSIS — I350 Nonrheumatic aortic (valve) stenosis: Secondary | ICD-10-CM

## 2023-03-05 DIAGNOSIS — I480 Paroxysmal atrial fibrillation: Secondary | ICD-10-CM | POA: Diagnosis not present

## 2023-03-05 DIAGNOSIS — Z510 Encounter for antineoplastic radiation therapy: Secondary | ICD-10-CM | POA: Diagnosis not present

## 2023-03-05 DIAGNOSIS — Z952 Presence of prosthetic heart valve: Secondary | ICD-10-CM | POA: Diagnosis not present

## 2023-03-05 DIAGNOSIS — I5032 Chronic diastolic (congestive) heart failure: Secondary | ICD-10-CM

## 2023-03-05 DIAGNOSIS — E119 Type 2 diabetes mellitus without complications: Secondary | ICD-10-CM

## 2023-03-05 DIAGNOSIS — I2510 Atherosclerotic heart disease of native coronary artery without angina pectoris: Secondary | ICD-10-CM

## 2023-03-05 DIAGNOSIS — Z7901 Long term (current) use of anticoagulants: Secondary | ICD-10-CM

## 2023-03-05 DIAGNOSIS — C189 Malignant neoplasm of colon, unspecified: Secondary | ICD-10-CM

## 2023-03-05 LAB — RAD ONC ARIA SESSION SUMMARY
Course Elapsed Days: 1
Plan Fractions Treated to Date: 2
Plan Prescribed Dose Per Fraction: 1.8 Gy
Plan Total Fractions Prescribed: 25
Plan Total Prescribed Dose: 45 Gy
Reference Point Dosage Given to Date: 3.6 Gy
Reference Point Session Dosage Given: 1.8 Gy
Session Number: 2

## 2023-03-05 NOTE — Patient Instructions (Signed)
Medication Instructions:  Your physician recommends that you continue on your current medications as directed. Please refer to the Current Medication list given to you today.  *If you need a refill on your cardiac medications before your next appointment, please call your pharmacy*   Lab Work: TODAY: NONE If you have labs (blood work) drawn today and your tests are completely normal, you will receive your results only by: MyChart Message (if you have MyChart) OR A paper copy in the mail If you have any lab test that is abnormal or we need to change your treatment, we will call you to review the results.   Testing/Procedures: NONE   Follow-Up: At [facility], you and your health needs are our priority.  As part of our continuing mission to provide you with exceptional heart care, we have created designated Provider Care Teams.  These Care Teams include your primary Cardiologist (physician) and Advanced Practice Providers (APPs -  Physician Assistants and Nurse Practitioners) who all work together to provide you with the care you need, when you need it.  We recommend signing up for the patient portal called "MyChart".  Sign up information is provided on this After Visit Summary.  MyChart is used to connect with patients for Virtual Visits (Telemedicine).  Patients are able to view lab/test results, encounter notes, upcoming appointments, etc.  Non-urgent messages can be sent to your provider as well.   To learn more about what you can do with MyChart, go to [email or web address].    Your next appointment:   KEEP SCHEDULED FOLLOW-UP

## 2023-03-06 ENCOUNTER — Ambulatory Visit
Admission: RE | Admit: 2023-03-06 | Discharge: 2023-03-06 | Disposition: A | Discharge status: Home / Self Care | Payer: No Typology Code available for payment source | Source: Ambulatory Visit | Attending: Radiation Oncology | Admitting: Radiation Oncology

## 2023-03-06 ENCOUNTER — Other Ambulatory Visit: Payer: Self-pay

## 2023-03-06 DIAGNOSIS — Z510 Encounter for antineoplastic radiation therapy: Secondary | ICD-10-CM | POA: Diagnosis not present

## 2023-03-06 LAB — RAD ONC ARIA SESSION SUMMARY
Course Elapsed Days: 2
Plan Fractions Treated to Date: 3
Plan Prescribed Dose Per Fraction: 1.8 Gy
Plan Total Fractions Prescribed: 25
Plan Total Prescribed Dose: 45 Gy
Reference Point Dosage Given to Date: 5.4 Gy
Reference Point Session Dosage Given: 1.8 Gy
Session Number: 3

## 2023-03-07 ENCOUNTER — Ambulatory Visit: Payer: No Typology Code available for payment source

## 2023-03-07 ENCOUNTER — Ambulatory Visit: Payer: Self-pay | Admitting: *Deleted

## 2023-03-07 DIAGNOSIS — R609 Edema, unspecified: Secondary | ICD-10-CM | POA: Insufficient documentation

## 2023-03-07 DIAGNOSIS — K629 Disease of anus and rectum, unspecified: Secondary | ICD-10-CM | POA: Insufficient documentation

## 2023-03-07 DIAGNOSIS — M25572 Pain in left ankle and joints of left foot: Secondary | ICD-10-CM | POA: Insufficient documentation

## 2023-03-07 DIAGNOSIS — I359 Nonrheumatic aortic valve disorder, unspecified: Secondary | ICD-10-CM | POA: Insufficient documentation

## 2023-03-07 DIAGNOSIS — L97919 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 with unspecified severity: Secondary | ICD-10-CM | POA: Insufficient documentation

## 2023-03-07 DIAGNOSIS — Z719 Counseling, unspecified: Secondary | ICD-10-CM | POA: Insufficient documentation

## 2023-03-07 DIAGNOSIS — M25512 Pain in left shoulder: Secondary | ICD-10-CM | POA: Insufficient documentation

## 2023-03-07 DIAGNOSIS — M8580 Other specified disorders of bone density and structure, unspecified site: Secondary | ICD-10-CM | POA: Insufficient documentation

## 2023-03-07 DIAGNOSIS — Z5982 Transportation insecurity: Secondary | ICD-10-CM | POA: Insufficient documentation

## 2023-03-07 DIAGNOSIS — H26491 Other secondary cataract, right eye: Secondary | ICD-10-CM | POA: Insufficient documentation

## 2023-03-07 DIAGNOSIS — Z961 Presence of intraocular lens: Secondary | ICD-10-CM | POA: Insufficient documentation

## 2023-03-07 DIAGNOSIS — H2513 Age-related nuclear cataract, bilateral: Secondary | ICD-10-CM | POA: Insufficient documentation

## 2023-03-07 DIAGNOSIS — R918 Other nonspecific abnormal finding of lung field: Secondary | ICD-10-CM | POA: Insufficient documentation

## 2023-03-07 DIAGNOSIS — Z122 Encounter for screening for malignant neoplasm of respiratory organs: Secondary | ICD-10-CM | POA: Insufficient documentation

## 2023-03-07 DIAGNOSIS — Z741 Need for assistance with personal care: Secondary | ICD-10-CM | POA: Insufficient documentation

## 2023-03-07 DIAGNOSIS — I872 Venous insufficiency (chronic) (peripheral): Secondary | ICD-10-CM | POA: Insufficient documentation

## 2023-03-07 DIAGNOSIS — R739 Hyperglycemia, unspecified: Secondary | ICD-10-CM | POA: Insufficient documentation

## 2023-03-07 NOTE — Patient Instructions (Addendum)
Visit Information  Thank you for taking time to visit with me today. Please don't hesitate to contact me if I can be of assistance to you.   Following are the goals we discussed today:   Goals Addressed             This Visit's Progress    Maintain his health at home related to Cardiac and cancer diagnoses (TAVR/PAF/CHF, colon cancer)  - care coordination services   On track    Patient will outreach to RN CM if develop concerns with obtaining medications Patient will outreach to care coordination team if extra support or transportation services needed    Interventions Today    Flowsheet Row Most Recent Value  Chronic Disease   Chronic disease during today's visit Congestive Heart Failure (CHF), Other  [Fatigue & Dyspnea on exertion today, Recent new heart valve/TAVR  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, chemo started this week on 03/04/23]  General Interventions   General Interventions Discussed/Reviewed General Interventions Discussed, Durable Medical Equipment (DME), Doctor Visits, Community Resources  Doctor Visits Discussed/Reviewed Doctor Visits Discussed, PCP, Database administrator (DME) Glucomoter, Other  [eye glasses]  PCP/Specialist Visits Compliance with follow-up visit  Exercise Interventions   Exercise Discussed/Reviewed Exercise Discussed, Physical Activity, Assistive device use and maintanence  Physical Activity Discussed/Reviewed Physical Activity Discussed  Education Interventions   Education Provided --  [reviewed transportation benefits as possible alternative to get him to medical appointments Discussed coordination services for possible extra support resources as needed]  Provided Verbal Education On Medication, Sick Day Rules, [employer] Resources  [encouraged rest, reviewed [facility] care coordination program and  team members]  Mental Health Interventions   Mental Health Discussed/Reviewed Mental Health Discussed,  Coping Strategies  Pharmacy Interventions   Pharmacy Dicussed/Reviewed Pharmacy Topics Discussed, Medications and their functions, Affording Medications  [[facility] not [facility] some [facility]   [facility] less than [facility]  He is too fatigue today to review all medicines]  Safety Interventions   Safety Discussed/Reviewed Safety Discussed, Home Safety  Home Safety Assistive Devices              Our next appointment is by telephone on 04/07/23 at 3 pm  Please call the care guide team at [phone] if you need to cancel or reschedule your appointment.   If you are experiencing a Mental Health or Behavioral Health Crisis or need someone to talk to, please call the Suicide and Crisis Lifeline: 988 call the [place] National Suicide Prevention Lifeline: 905-170-5209 or TTY: 5034405980 TTY 303-332-3537) to talk to a trained counselor call 1-800-273-TALK (toll free, 24 hour hotline) call the [facility]: (204)388-5056 call 911   Patient verbalizes understanding of instructions and care plan provided today and agrees to view in MyChart. Active MyChart status and patient understanding of how to access instructions and care plan via MyChart confirmed with patient.     The patient has been provided with contact information for the care management team and has been advised to call with any health related questions or concerns.   Lindzey Zent L. Noelle Penner, RN, BSN, [facility]  [facility] Care Management Coordinator  [phone]  Fax: [phone]

## 2023-03-07 NOTE — Patient Outreach (Signed)
Care Coordination   Initial Visit Note   03/07/2023 Name: Gary Carroll MRN: [id number] DOB: 04-19-1946  Gary Carroll is a 77 y.o. year old male (retired from the [employer] & who sees Margo Aye, Kathleene Hazel, MD for primary care. I spoke with  [name] by phone today.  What matters to the patients health and wellness today?  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 Other than shortness of breath & fatigue he denies worsening symptoms  He confirms he will outreach to medical staff as needed  He confirms he is independent but does have a few friends who can assist if needed. Lives about a mile from his pcp office but attends [employer] research [employer], [employer] [facility] in [place]. Continues to drive      [facility]  Most pills [facility] less than $5 He is too fatigue today to review all medicines    Goals Addressed             This Visit's Progress    Maintain his health at home related to Cardiac and cancer diagnoses (TAVR/PAF/CHF, colon cancer)  - care coordination services   On track    Patient will outreach to RN CM if develop concerns with obtaining medications Patient will outreach to care coordination team if extra support or transportation services needed    Interventions Today    Flowsheet Row Most Recent Value  Chronic Disease   Chronic disease during today's visit Congestive Heart Failure (CHF), Other  [Fatigue & Dyspnea on exertion today, Recent new heart valve/TAVR  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, chemo started this week on 03/04/23]  General Interventions   General Interventions Discussed/Reviewed General  Interventions Discussed, Durable Medical Equipment (DME), Doctor Visits, Community Resources  Doctor Visits Discussed/Reviewed Doctor Visits Discussed, PCP, Database administrator (DME) Glucomoter, Other  [eye glasses]  PCP/Specialist Visits Compliance with follow-up visit  Exercise Interventions   Exercise Discussed/Reviewed Exercise Discussed, Physical Activity, Assistive device use and maintanence  Physical Activity Discussed/Reviewed Physical Activity Discussed  Education Interventions   Education Provided --  [reviewed transportation benefits as possible alternative to get him to medical appointments Discussed coordination services for possible extra support resources as needed]  Provided Verbal Education On Medication, Sick Day Rules, [employer] Resources  [encouraged rest, reviewed [facility] care coordination program and  team members]  Mental Health Interventions   Mental Health Discussed/Reviewed Mental Health Discussed, Coping Strategies  Pharmacy Interventions   Pharmacy Dicussed/Reviewed Pharmacy Topics Discussed, Medications and their functions, Affording Medications  [[facility]S not having some medicines ordered   Most pills at [facility] less than $5  He is too fatigue today to review all medicines]  Safety Interventions   Safety Discussed/Reviewed Safety Discussed, Home Safety  Home Safety Assistive Devices              SDOH assessments and interventions completed:  Yes  SDOH Interventions Today    Flowsheet Row Most Recent Value  SDOH Interventions   Food Insecurity Interventions Intervention Not Indicated  Transportation Interventions Intervention Not Indicated  Health Literacy Interventions Intervention Not Indicated        Care Coordination Interventions:  Yes, provided  Follow up plan: Follow up call scheduled for 04/07/23    Encounter Outcome:  Patient Visit Completed   Cala Bradford L. Noelle Penner, RN, BSN, [place]  [facility] Care Management Coordinator  [phone]   Fax: [phone]

## 2023-03-10 ENCOUNTER — Other Ambulatory Visit: Payer: Self-pay

## 2023-03-10 ENCOUNTER — Ambulatory Visit
Admission: RE | Admit: 2023-03-10 | Discharge: 2023-03-10 | Disposition: A | Discharge status: Home / Self Care | Payer: No Typology Code available for payment source | Source: Ambulatory Visit | Attending: Radiation Oncology | Admitting: Radiation Oncology

## 2023-03-10 DIAGNOSIS — Z510 Encounter for antineoplastic radiation therapy: Secondary | ICD-10-CM | POA: Diagnosis not present

## 2023-03-10 LAB — RAD ONC ARIA SESSION SUMMARY
Course Elapsed Days: 6
Plan Fractions Treated to Date: 4
Plan Prescribed Dose Per Fraction: 1.8 Gy
Plan Total Fractions Prescribed: 25
Plan Total Prescribed Dose: 45 Gy
Reference Point Dosage Given to Date: 7.2 Gy
Reference Point Session Dosage Given: 1.8 Gy
Session Number: 4

## 2023-03-11 ENCOUNTER — Ambulatory Visit: Payer: No Typology Code available for payment source

## 2023-03-11 ENCOUNTER — Other Ambulatory Visit: Payer: Self-pay

## 2023-03-11 ENCOUNTER — Ambulatory Visit
Admission: RE | Admit: 2023-03-11 | Discharge: 2023-03-11 | Disposition: A | Discharge status: Home / Self Care | Payer: No Typology Code available for payment source | Source: Ambulatory Visit | Attending: Radiation Oncology | Admitting: Radiation Oncology

## 2023-03-11 DIAGNOSIS — Z510 Encounter for antineoplastic radiation therapy: Secondary | ICD-10-CM | POA: Diagnosis not present

## 2023-03-11 LAB — RAD ONC ARIA SESSION SUMMARY
Course Elapsed Days: 7
Plan Fractions Treated to Date: 5
Plan Prescribed Dose Per Fraction: 1.8 Gy
Plan Total Fractions Prescribed: 25
Plan Total Prescribed Dose: 45 Gy
Reference Point Dosage Given to Date: 9 Gy
Reference Point Session Dosage Given: 1.8 Gy
Session Number: 5

## 2023-03-12 ENCOUNTER — Ambulatory Visit
Admission: RE | Admit: 2023-03-12 | Discharge: 2023-03-12 | Disposition: A | Discharge status: Home / Self Care | Payer: No Typology Code available for payment source | Source: Ambulatory Visit | Attending: Radiation Oncology | Admitting: Radiation Oncology

## 2023-03-12 ENCOUNTER — Other Ambulatory Visit: Payer: Self-pay

## 2023-03-12 DIAGNOSIS — Z510 Encounter for antineoplastic radiation therapy: Secondary | ICD-10-CM | POA: Diagnosis not present

## 2023-03-12 LAB — RAD ONC ARIA SESSION SUMMARY
Course Elapsed Days: 8
Plan Fractions Treated to Date: 6
Plan Prescribed Dose Per Fraction: 1.8 Gy
Plan Total Fractions Prescribed: 25
Plan Total Prescribed Dose: 45 Gy
Reference Point Dosage Given to Date: 10.8 Gy
Reference Point Session Dosage Given: 1.8 Gy
Session Number: 6

## 2023-03-13 ENCOUNTER — Ambulatory Visit: Payer: No Typology Code available for payment source

## 2023-03-13 ENCOUNTER — Telehealth: Payer: Self-pay | Admitting: Radiation Oncology

## 2023-03-13 NOTE — Telephone Encounter (Signed)
10/24 @ 8:15 am Patient called to cancel his treatment appointment for today due to needing rest and tiredness.  Email sent to L4 machine and Support RTT so they are aware.

## 2023-03-14 ENCOUNTER — Other Ambulatory Visit: Payer: Self-pay

## 2023-03-14 ENCOUNTER — Emergency Department (HOSPITAL_COMMUNITY): Payer: No Typology Code available for payment source

## 2023-03-14 ENCOUNTER — Emergency Department (HOSPITAL_COMMUNITY)
Admission: EM | Admit: 2023-03-14 | Discharge: 2023-03-14 | Disposition: A | Discharge status: Home / Self Care | Payer: No Typology Code available for payment source | Attending: Emergency Medicine | Admitting: Emergency Medicine

## 2023-03-14 ENCOUNTER — Ambulatory Visit: Payer: No Typology Code available for payment source

## 2023-03-14 ENCOUNTER — Encounter (HOSPITAL_COMMUNITY): Payer: Self-pay

## 2023-03-14 DIAGNOSIS — R197 Diarrhea, unspecified: Secondary | ICD-10-CM | POA: Insufficient documentation

## 2023-03-14 DIAGNOSIS — Z85048 Personal history of other malignant neoplasm of rectum, rectosigmoid junction, and anus: Secondary | ICD-10-CM | POA: Insufficient documentation

## 2023-03-14 DIAGNOSIS — R000 Tachycardia, unspecified: Secondary | ICD-10-CM | POA: Insufficient documentation

## 2023-03-14 DIAGNOSIS — N3000 Acute cystitis without hematuria: Secondary | ICD-10-CM | POA: Insufficient documentation

## 2023-03-14 DIAGNOSIS — I10 Essential (primary) hypertension: Secondary | ICD-10-CM | POA: Insufficient documentation

## 2023-03-14 DIAGNOSIS — Z79899 Other long term (current) drug therapy: Secondary | ICD-10-CM | POA: Diagnosis not present

## 2023-03-14 DIAGNOSIS — Z743 Need for continuous supervision: Secondary | ICD-10-CM | POA: Diagnosis not present

## 2023-03-14 DIAGNOSIS — Z7901 Long term (current) use of anticoagulants: Secondary | ICD-10-CM | POA: Insufficient documentation

## 2023-03-14 LAB — URINALYSIS, ROUTINE W REFLEX MICROSCOPIC
Bilirubin Urine: NEGATIVE
Glucose, UA: NEGATIVE mg/dL
Ketones, ur: NEGATIVE mg/dL
Nitrite: NEGATIVE
Protein, ur: 100 mg/dL — AB
RBC / HPF: >50 RBC/hpf (ref 0–5)
Specific Gravity, Urine: 1.013 (ref 1.005–1.030)
WBC, UA: >50 WBC/hpf (ref 0–5)
pH: 6 (ref 5.0–8.0)

## 2023-03-14 LAB — CBC WITH DIFFERENTIAL/PLATELET
Abs Immature Granulocytes: 0.02 10*3/uL (ref 0.00–0.07)
Basophils Absolute: 0 10*3/uL (ref 0.0–0.1)
Basophils Relative: 1 %
Eosinophils Absolute: 0 10*3/uL (ref 0.0–0.5)
Eosinophils Relative: 0 %
HCT: 35.7 % — ABNORMAL LOW (ref 39.0–52.0)
Hemoglobin: 10.8 g/dL — ABNORMAL LOW (ref 13.0–17.0)
Immature Granulocytes: 1 %
Lymphocytes Relative: 17 %
Lymphs Abs: 0.6 10*3/uL — ABNORMAL LOW (ref 0.7–4.0)
MCH: 26.1 pg (ref 26.0–34.0)
MCHC: 30.3 g/dL (ref 30.0–36.0)
MCV: 86.2 fL (ref 80.0–100.0)
Monocytes Absolute: 0.4 10*3/uL (ref 0.1–1.0)
Monocytes Relative: 12 %
Neutro Abs: 2.4 10*3/uL (ref 1.7–7.7)
Neutrophils Relative %: 69 %
Platelets: 97 10*3/uL — ABNORMAL LOW (ref 150–400)
RBC: 4.14 MIL/uL — ABNORMAL LOW (ref 4.22–5.81)
RDW: 16.8 % — ABNORMAL HIGH (ref 11.5–15.5)
WBC: 3.4 10*3/uL — ABNORMAL LOW (ref 4.0–10.5)
nRBC: 0 % (ref 0.0–0.2)

## 2023-03-14 LAB — COMPREHENSIVE METABOLIC PANEL
ALT: 43 U/L (ref 0–44)
AST: 37 U/L (ref 15–41)
Albumin: 3.1 g/dL — ABNORMAL LOW (ref 3.5–5.0)
Alkaline Phosphatase: 65 U/L (ref 38–126)
Anion gap: 12 (ref 5–15)
BUN: 23 mg/dL (ref 8–23)
CO2: 21 mmol/L — ABNORMAL LOW (ref 22–32)
Calcium: 8.8 mg/dL — ABNORMAL LOW (ref 8.9–10.3)
Chloride: 106 mmol/L (ref 98–111)
Creatinine, Ser: 0.85 mg/dL (ref 0.61–1.24)
GFR, Estimated: >60 mL/min (ref >60)
Glucose, Bld: 125 mg/dL — ABNORMAL HIGH (ref 70–99)
Potassium: 3.9 mmol/L (ref 3.5–5.1)
Sodium: 139 mmol/L (ref 135–145)
Total Bilirubin: 0.6 mg/dL (ref 0.3–1.2)
Total Protein: 6.8 g/dL (ref 6.5–8.1)

## 2023-03-14 LAB — C DIFFICILE QUICK SCREEN W PCR REFLEX
C Diff antigen: NEGATIVE
C Diff interpretation: NOT DETECTED
C Diff toxin: NEGATIVE

## 2023-03-14 LAB — MAGNESIUM: Magnesium: 2 mg/dL (ref 1.7–2.4)

## 2023-03-14 LAB — POC OCCULT BLOOD, ED: Fecal Occult Bld: NEGATIVE

## 2023-03-14 LAB — LIPASE, BLOOD: Lipase: 23 U/L (ref 11–51)

## 2023-03-14 MED ORDER — CEPHALEXIN 500 MG PO CAPS: 500.0000 mg | ORAL_CAPSULE | Freq: Three times a day (TID) | ORAL | 0 refills | Status: DC

## 2023-03-14 MED ORDER — SODIUM CHLORIDE 0.9 % IV BOLUS
500.0000 mL | Freq: Once | INTRAVENOUS | Status: AC
  Administered 2023-03-14: 500 mL via INTRAVENOUS

## 2023-03-14 MED ORDER — PHENAZOPYRIDINE HCL 200 MG PO TABS
200.0000 mg | ORAL_TABLET | Freq: Three times a day (TID) | ORAL | 0 refills | Status: DC
Start: 2023-03-14 — End: 2023-08-21

## 2023-03-14 MED ORDER — SODIUM CHLORIDE 0.9 % IV SOLN
1.0000 g | Freq: Once | INTRAVENOUS | Status: AC
  Administered 2023-03-14: 1 g via INTRAVENOUS
  Filled 2023-03-14: qty 10

## 2023-03-14 MED ORDER — PHENAZOPYRIDINE HCL 100 MG PO TABS
200.0000 mg | ORAL_TABLET | Freq: Once | ORAL | Status: AC
  Administered 2023-03-14: 200 mg via ORAL
  Filled 2023-03-14: qty 2

## 2023-03-14 MED ORDER — IOHEXOL 300 MG/ML  SOLN
100.0000 mL | Freq: Once | INTRAMUSCULAR | Status: AC | PRN
Start: 2023-03-14 — End: 2023-03-14
  Administered 2023-03-14: 100 mL via INTRAVENOUS

## 2023-03-14 NOTE — ED Provider Notes (Signed)
[place] EMERGENCY DEPARTMENT AT [facility] Provider Note   CSN: [id number] Arrival date & time: 03/14/23  0751     History  Chief Complaint  Patient presents with   Diarrhea    Gary Carroll is a 77 y.o. male.  Pt is a 77 yo male with pmhx significant for rectal cancer, hld, htn, ED, arthritis, GERD, sleep apnea, afib (on Eliquis), and recent TAVR.  Rectal cancer was diagnosed in August on colonoscopy done for rectal pain and bleeding.  A mass was found, biopsied, and found to be adenocarcinoma. Treatment has been delayed due to several admissions for afib and severe aortic stenosis requiring TAVR.  Pt just started radiation this week.  He said it has worn him out and he could not go to radiation tx yesterday.  He developed severe diarrhea yesterday and it has continued.  He denies any pain or fevers.  He has no control over it.       Home Medications Prior to Admission medications   Medication Sig Start Date End Date Taking? Authorizing Provider  cephALEXin (KEFLEX) 500 MG capsule Take 1 capsule (500 mg total) by mouth 3 (three) times daily. 03/14/23  Yes Jacalyn Lefevre, MD  phenazopyridine (PYRIDIUM) 200 MG tablet Take 1 tablet (200 mg total) by mouth 3 (three) times daily. 03/14/23  Yes Jacalyn Lefevre, MD  acetaminophen (TYLENOL) 650 MG CR tablet Take 1,300 mg by mouth every 8 (eight) hours as needed for pain.    [provider]  amoxicillin (AMOXIL) 500 MG tablet Take 2,000 mg by mouth as directed. take 4 tablets by mouth 1 hour prior to dental procedures and cleanings.    [provider]  apixaban (ELIQUIS) 5 MG TABS tablet Take 1 tablet (5 mg total) by mouth 2 (two) times daily. 08/24/15   Philip Aspen, Limmie Patricia, MD  diltiazem (CARDIZEM CD) 120 MG 24 hr capsule Take 1 capsule (120 mg total) by mouth daily. 01/18/23   Dione Booze, MD  dutasteride (AVODART) 0.5 MG capsule Take 0.5 mg by mouth daily.    [provider]  furosemide  (LASIX) 40 MG tablet Take 1 tablet (40 mg total) by mouth 2 (two) times daily. You can take an additional 40mg  of Lasix as needed for shortness of breath or swelling Patient taking differently: Take 40 mg by mouth daily. 02/06/23   Janetta Hora, PA-C  losartan (COZAAR) 50 MG tablet Take 0.5 tablets (25 mg total) by mouth daily. Patient taking differently: Take 50 mg by mouth daily. 12/23/22   Vassie Loll, MD  Magnesium 500 MG CAPS Take 500 mg by mouth daily.    [provider]  Melatonin 10 MG CAPS Take 10 mg by mouth at bedtime.    [provider]  metFORMIN (GLUCOPHAGE) 500 MG tablet Take 500 mg by mouth 2 (two) times daily with a meal.    [provider]  metoprolol tartrate (LOPRESSOR) 50 MG tablet Take 1 tablet (50 mg total) by mouth 2 (two) times daily. 01/18/23   Dione Booze, MD  Multiple Vitamins-Minerals (MULTIVITAL) tablet Take 1 tablet by mouth daily.    [provider]  Omega-3 Fatty Acids (FISH OIL) 1000 MG CAPS Take 2,000 mg by mouth daily.    [provider]  oxybutynin (DITROPAN) 5 MG tablet Take 5 mg by mouth every evening. 06/01/19   [provider]  pantoprazole (PROTONIX) 40 MG tablet Take 40 mg by mouth daily.    [provider]  potassium chloride (KLOR-CON) 10 MEQ tablet Take 1 tablet (10 mEq total) by mouth daily. 02/06/23   Janetta Hora, PA-C  rosuvastatin (CRESTOR) 20 MG tablet Take 20 mg by mouth daily.    [provider]  vitamin C (ASCORBIC ACID) 500 MG tablet Take 500 mg by mouth daily.    [provider]      Allergies    Diovan [valsartan] and Tagamet [cimetidine]    Review of Systems   Review of Systems  Gastrointestinal:  Positive for diarrhea.  All other systems reviewed and are negative.   Physical Exam Updated Vital Signs BP (!) 155/97   Pulse (!) 116   Temp 97.8 F (36.6 C) (Oral)   Resp (!) 25   Ht 5\' 11"  (1.803 m)   Wt 116.2 kg   SpO2 96%   BMI 35.73  kg/m  Physical Exam Vitals and nursing note reviewed.  Constitutional:      Appearance: Normal appearance. He is obese.  HENT:     Head: Normocephalic and atraumatic.     Right Ear: External ear normal.     Left Ear: External ear normal.     Nose: Nose normal.     Mouth/Throat:     Mouth: Mucous membranes are moist.     Pharynx: Oropharynx is clear.  Eyes:     Extraocular Movements: Extraocular movements intact.     Conjunctiva/sclera: Conjunctivae normal.     Pupils: Pupils are equal, round, and reactive to light.  Cardiovascular:     Rate and Rhythm: Tachycardia present. Rhythm irregular.     Pulses: Normal pulses.     Heart sounds: Normal heart sounds.  Pulmonary:     Effort: Pulmonary effort is normal.     Breath sounds: Normal breath sounds.  Abdominal:     General: Abdomen is flat. Bowel sounds are normal.     Palpations: Abdomen is soft.  Musculoskeletal:        General: Normal range of motion.     Cervical back: Normal range of motion and neck supple.  Skin:    General: Skin is warm.     Capillary Refill: Capillary refill takes less than 2 seconds.  Neurological:     General: No focal deficit present.     Mental Status: He is alert and oriented to person, place, and time.  Psychiatric:        Mood and Affect: Mood normal.        Behavior: Behavior normal.     ED Results / Procedures / Treatments   Labs (all labs ordered are listed, but only abnormal results are displayed) Labs Reviewed  CBC WITH DIFFERENTIAL/PLATELET - Abnormal; Notable for the following components:      Result Value   WBC 3.4 (*)    RBC 4.14 (*)    Hemoglobin 10.8 (*)    HCT 35.7 (*)    RDW 16.8 (*)    Platelets 97 (*)    Lymphs Abs 0.6 (*)    All other components within normal limits  COMPREHENSIVE METABOLIC PANEL - Abnormal; Notable for the following components:   CO2 21 (*)    Glucose, Bld 125 (*)    Calcium 8.8 (*)    Albumin 3.1 (*)    All other components within normal  limits  URINALYSIS, ROUTINE W REFLEX MICROSCOPIC - Abnormal; Notable for the following components:   APPearance TURBID (*)    Hgb urine dipstick SMALL (*)  Protein, ur 100 (*)    Leukocytes,Ua LARGE (*)    Bacteria, UA MANY (*)    Non Squamous Epithelial 0-5 (*)    All other components within normal limits  C DIFFICILE QUICK SCREEN W PCR REFLEX    GASTROINTESTINAL PANEL BY PCR, STOOL (REPLACES STOOL CULTURE)  URINE CULTURE  LIPASE, BLOOD  MAGNESIUM  POC OCCULT BLOOD, ED    EKG EKG Interpretation Date/Time:  Friday March 14 2023 08:36:17 EDT Ventricular Rate:  103 PR Interval:    QRS Duration:  86 QT Interval:  349 QTC Calculation: 457 R Axis:   -25  Text Interpretation: Atrial flutter Borderline left axis deviation Nonspecific T abnormalities, lateral leads No significant change since last tracing Confirmed by Jacalyn Lefevre [phone]) on 03/14/2023 8:53:55 AM  Radiology CT ABDOMEN PELVIS W CONTRAST  Result Date: 03/14/2023 CLINICAL DATA:  Diarrhea EXAM: CT ABDOMEN AND PELVIS WITH CONTRAST TECHNIQUE: Multidetector CT imaging of the abdomen and pelvis was performed using the standard protocol following bolus administration of intravenous contrast. RADIATION DOSE REDUCTION: This exam was performed according to the departmental dose-optimization program which includes automated exposure control, adjustment of the mA and/or kV according to patient size and/or use of iterative reconstruction technique. CONTRAST:  OMNIPAQUE IOHEXOL 300 MG/ML  SOLN COMPARISON:  CTA abdomen/pelvis dated February 18, 2023 FINDINGS: Lower chest: No acute abnormality. The previously noted small bilateral pleural effusions have resolved. Status post TAVR. Hepatobiliary: No focal liver abnormality is seen. Status post cholecystectomy. No biliary dilatation. Pancreas: Unremarkable. No pancreatic ductal dilatation or surrounding inflammatory changes. Spleen: Normal in size without focal abnormality.  Adrenals/Urinary Tract: Unchanged 9 mm left adrenal gland nodule, likely a benign adenoma. No renal calculi or hydronephrosis. The bladder is distended with circumferential bladder wall thickening and mild stranding. Stomach/Bowel: Stomach is within normal limits. A duodenal diverticulum is noted. No evidence of obstruction. Similar circumferential wall thickening of the rectum, consistent with known malignancy. No abdominopelvic ascites. Vascular/Lymphatic: The abdominal aorta is normal in caliber with severe atherosclerotic calcification. Essentially unchanged enlarged periportal, para-aortic, pelvic and bilateral inguinal lymph nodes. An indexed left external iliac lymph node measures 1.7 cm in short axis (series 2, image 74), unchanged. An indexed right inguinal lymph node measures 1.7 cm in short axis (series 2, image 101), unchanged. Reproductive: Prostate is unremarkable. Other: There is a new irregular curvilinear hypodense peripherally enhancing collection within the subcutaneous fat of the right ventral lower abdominal wall, abutting the superolateral margin of the right inguinal canal, with surrounding stranding (series 2, images 87 through 95). This collection measures approximately 10.2 cm transverse x 1.6 cm AP x 1.8 cm craniocaudal (series 4, image 41 and series 2, image 87). There is mild overlying cutaneous thickening. Musculoskeletal: No acute osseous abnormality. Unchanged sclerotic lesion of the right iliac bone is favored to represent a bone island. Multilevel degenerative changes of the thoracolumbar spine. IMPRESSION: 1. Irregular hypodense peripherally enhancing collection of indeterminate sterility within the subcutaneous fat of the right ventral lower abdominal wall, abutting the superolateral margin of the right inguinal canal, with mild surrounding inflammatory changes. This could relate to recent instrumentation. Recommend correlation with recent history and physical exam. 2.  Circumferential bladder wall thickening with stranding is suggestive of cystitis. Recommend correlation with urinalysis. 3. Similar circumferential wall thickening of the rectum, consistent with known malignancy. 4. Essentially unchanged enlarged periportal, para-aortic, pelvic and bilateral inguinal lymph nodes. 5. Resolution of previously noted small bilateral pleural effusions. 6.  Aortic Atherosclerosis (ICD10-I70.0). Electronically Signed  By: Hart Robinsons M.D.   On: 03/14/2023 13:03   DG Chest Portable 1 View  Result Date: 03/14/2023 CLINICAL DATA:  Shortness of breath EXAM: PORTABLE CHEST 1 VIEW COMPARISON:  X-ray 02/21/2023 FINDINGS: No consolidation, pneumothorax or effusion. Normal cardiopericardial silhouette. Overlapping cardiac leads. Film is under penetrated. IMPRESSION: No acute cardiopulmonary disease. Electronically Signed   By: Karen Kays M.D.   On: 03/14/2023 10:32    Procedures Procedures    Medications Ordered in ED Medications  cefTRIAXone (ROCEPHIN) 1 g in sodium chloride 0.9 % 100 mL IVPB (has no administration in time range)  sodium chloride 0.9 % bolus 500 mL (0 mLs Intravenous Stopped 03/14/23 0951)  phenazopyridine (PYRIDIUM) tablet 200 mg (200 mg Oral Given 03/14/23 1148)  iohexol (OMNIPAQUE) 300 MG/ML solution 100 mL (100 mLs Intravenous Contrast Given 03/14/23 1116)    ED Course/ Medical Decision Making/ A&P                                 Medical Decision Making Amount and/or Complexity of Data Reviewed Labs: ordered. Radiology: ordered.  Risk Prescription drug management.   This patient presents to the ED for concern of diarrhea, this involves an extensive number of treatment options, and is a complaint that carries with it a high risk of complications and morbidity.  The differential diagnosis includes electrolyte abn, radiation proctitis, colitis, c diff, anemia   Co morbidities that complicate the patient evaluation  rectal cancer, hld,  htn, ED, arthritis, GERD, sleep apnea, afib (on Eliquis), and recent TAVR   Additional history obtained:  Additional history obtained from epic chart review External records from outside source obtained and reviewed including EMS report   Lab Tests:  I Ordered, and personally interpreted labs.  The pertinent results include:  cbc with wbc 3.4, hgb 10.8, plt 97; ua + for uti, cmp nl   Imaging Studies ordered:  I ordered imaging studies including cxr and ct abd/pelvis  I independently visualized and interpreted imaging which showed  CXR: No acute cardiopulmonary disease.  CT abd/pelvis: 1. Irregular hypodense peripherally enhancing collection of  indeterminate sterility within the subcutaneous fat of the right  ventral lower abdominal wall, abutting the superolateral margin of  the right inguinal canal, with mild surrounding inflammatory  changes. This could relate to recent instrumentation. Recommend  correlation with recent history and physical exam.  2. Circumferential bladder wall thickening with stranding is  suggestive of cystitis. Recommend correlation with urinalysis.  3. Similar circumferential wall thickening of the rectum, consistent  with known malignancy.  4. Essentially unchanged enlarged periportal, para-aortic, pelvic  and bilateral inguinal lymph nodes.  5. Resolution of previously noted small bilateral pleural effusions.  6.  Aortic Atherosclerosis (ICD10-I70.0).  ab I agree with the radiologist interpretation   Cardiac Monitoring:  The patient was maintained on a cardiac monitor.  I personally viewed and interpreted the cardiac monitored which showed an underlying rhythm of: afib   Medicines ordered and prescription drug management:  I ordered medication including ivfs  for sx  Reevaluation of the patient after these medicines showed that the patient improved I have reviewed the patients home medicines and have made adjustments as needed   Test  Considered:  ct   Critical Interventions:  Abx/fluids  Problem List / ED Course:  UTI:  pt given rocephin here and is d/c with keflex/pyridium Diarrhea:  no diarrhea while here.  He did eat  spicy wings last night.  Pt is given a list of foods to eat to help with diarrhea.  No evidence of colitis on ct.  Pt is feeling better.  He is eating/drinking.  He is stable for d/c.  Return if worse.   Reevaluation:  After the interventions noted above, I reevaluated the patient and found that they have :improved   Social Determinants of Health:  Lives at home   Dispostion:  After consideration of the diagnostic results and the patients response to treatment, I feel that the patent would benefit from discharge with outpatient f/u        Final Clinical Impression(s) / ED Diagnoses Final diagnoses:  Acute cystitis without hematuria  Diarrhea, unspecified type    Rx / DC Orders ED Discharge Orders          Ordered    cephALEXin (KEFLEX) 500 MG capsule  3 times daily        03/14/23 1357    phenazopyridine (PYRIDIUM) 200 MG tablet  3 times daily        03/14/23 1357              Jacalyn Lefevre, MD 03/14/23 1401

## 2023-03-14 NOTE — ED Notes (Signed)
Pt stated he "probably shouldn't have had that spicy food lastnight", and proceeded to tell about his hot wings.

## 2023-03-14 NOTE — ED Notes (Signed)
Pt stated he could not use a condom cath as it just "keeps coming off" .Gary Carroll He has called out to the nurse's station at least 10 times the last hour for bathroom. Tech and this nurse keep rotating in to care for him. He continues to ask what are the plans for him as far as is he staying here or what.

## 2023-03-14 NOTE — ED Triage Notes (Signed)
Pt states he had numerous appointments on Wednesday so he was "too tired to go to radiation therapy" on Thursday. States diarrhea began around bedtime yesterday and now he just feels weak. VS WNL CBG from EMS 131

## 2023-03-15 LAB — GASTROINTESTINAL PANEL BY PCR, STOOL (REPLACES STOOL CULTURE)

## 2023-03-17 ENCOUNTER — Ambulatory Visit
Admission: RE | Admit: 2023-03-17 | Discharge: 2023-03-17 | Disposition: A | Discharge status: Home / Self Care | Payer: No Typology Code available for payment source | Source: Ambulatory Visit | Attending: Radiation Oncology | Admitting: Radiation Oncology

## 2023-03-17 ENCOUNTER — Other Ambulatory Visit: Payer: Self-pay

## 2023-03-17 DIAGNOSIS — Z510 Encounter for antineoplastic radiation therapy: Secondary | ICD-10-CM | POA: Diagnosis not present

## 2023-03-17 LAB — MOLECULAR PATHOLOGY

## 2023-03-17 LAB — RAD ONC ARIA SESSION SUMMARY
Course Elapsed Days: 13
Plan Fractions Treated to Date: 7
Plan Prescribed Dose Per Fraction: 1.8 Gy
Plan Total Fractions Prescribed: 25
Plan Total Prescribed Dose: 45 Gy
Reference Point Dosage Given to Date: 12.6 Gy
Reference Point Session Dosage Given: 1.8 Gy
Session Number: 7

## 2023-03-17 LAB — URINE CULTURE: Culture: >=100000 — AB

## 2023-03-18 ENCOUNTER — Telehealth (HOSPITAL_BASED_OUTPATIENT_CLINIC_OR_DEPARTMENT_OTHER): Payer: Self-pay | Admitting: *Deleted

## 2023-03-18 ENCOUNTER — Other Ambulatory Visit: Payer: Self-pay

## 2023-03-18 ENCOUNTER — Ambulatory Visit
Admission: RE | Admit: 2023-03-18 | Discharge: 2023-03-18 | Disposition: A | Discharge status: Home / Self Care | Payer: No Typology Code available for payment source | Source: Ambulatory Visit | Attending: Radiation Oncology | Admitting: Radiation Oncology

## 2023-03-18 ENCOUNTER — Ambulatory Visit
Admission: RE | Admit: 2023-03-18 | Discharge: 2023-03-18 | Disposition: A | Discharge status: Home / Self Care | Payer: No Typology Code available for payment source | Source: Ambulatory Visit | Attending: Radiation Oncology

## 2023-03-18 DIAGNOSIS — Z510 Encounter for antineoplastic radiation therapy: Secondary | ICD-10-CM | POA: Diagnosis not present

## 2023-03-18 LAB — RAD ONC ARIA SESSION SUMMARY
Course Elapsed Days: 14
Plan Fractions Treated to Date: 8
Plan Prescribed Dose Per Fraction: 1.8 Gy
Plan Total Fractions Prescribed: 25
Plan Total Prescribed Dose: 45 Gy
Reference Point Dosage Given to Date: 14.4 Gy
Reference Point Session Dosage Given: 1.8 Gy
Session Number: 8

## 2023-03-18 NOTE — Telephone Encounter (Signed)
Post ED Visit - Positive Culture Follow-up  Culture report reviewed by antimicrobial stewardship pharmacist: [facility] [x]  Daylene Posey, Pharm.D. []  Celedonio Miyamoto, Pharm.D., BCPS AQ-ID []  Garvin Fila, Pharm.D., BCPS []  Georgina Pillion, Pharm.D., BCPS []  [place], [address].D., BCPS, AAHIVP []  Estella Husk, Pharm.D., BCPS, AAHIVP []  Lysle Pearl, PharmD, BCPS []  Phillips Climes, PharmD, BCPS []  Agapito Games, PharmD, BCPS []  Verlan Friends, PharmD []  Mervyn Gay, PharmD, BCPS []  Vinnie Level, PharmD  [facility] []  Len Childs, PharmD []  Greer Pickerel, PharmD []  Adalberto Cole, PharmD []  Perlie Gold, Rph []  Lonell Face) Jean Rosenthal, PharmD []  Earl Many, PharmD []  Junita Push, PharmD []  Dorna Leitz, PharmD []  Terrilee Files, PharmD []  Lynann Beaver, PharmD []  Keturah Barre, PharmD []  Loralee Pacas, PharmD []  Bernadene Person, PharmD   Positive urine culture Treated with Cephalexin, organism sensitive to the same and no further patient follow-up is required at this time.  Virl [facility] 03/18/2023, 8:15 AM

## 2023-03-19 ENCOUNTER — Other Ambulatory Visit: Payer: Self-pay

## 2023-03-19 ENCOUNTER — Ambulatory Visit
Admission: RE | Admit: 2023-03-19 | Discharge: 2023-03-19 | Disposition: A | Discharge status: Home / Self Care | Payer: No Typology Code available for payment source | Source: Ambulatory Visit | Attending: Radiation Oncology

## 2023-03-19 DIAGNOSIS — Z510 Encounter for antineoplastic radiation therapy: Secondary | ICD-10-CM | POA: Diagnosis not present

## 2023-03-19 LAB — RAD ONC ARIA SESSION SUMMARY
Course Elapsed Days: 15
Plan Fractions Treated to Date: 9
Plan Prescribed Dose Per Fraction: 1.8 Gy
Plan Total Fractions Prescribed: 25
Plan Total Prescribed Dose: 45 Gy
Reference Point Dosage Given to Date: 16.2 Gy
Reference Point Session Dosage Given: 1.8 Gy
Session Number: 9

## 2023-03-20 ENCOUNTER — Ambulatory Visit
Admission: RE | Admit: 2023-03-20 | Discharge: 2023-03-20 | Disposition: A | Discharge status: Home / Self Care | Payer: No Typology Code available for payment source | Source: Ambulatory Visit | Attending: Radiation Oncology | Admitting: Radiation Oncology

## 2023-03-20 ENCOUNTER — Other Ambulatory Visit: Payer: Self-pay | Admitting: *Deleted

## 2023-03-20 ENCOUNTER — Other Ambulatory Visit: Payer: Self-pay

## 2023-03-20 DIAGNOSIS — R197 Diarrhea, unspecified: Secondary | ICD-10-CM | POA: Insufficient documentation

## 2023-03-20 DIAGNOSIS — Z510 Encounter for antineoplastic radiation therapy: Secondary | ICD-10-CM | POA: Diagnosis not present

## 2023-03-20 LAB — RAD ONC ARIA SESSION SUMMARY
Course Elapsed Days: 16
Plan Fractions Treated to Date: 10
Plan Prescribed Dose Per Fraction: 1.8 Gy
Plan Total Fractions Prescribed: 25
Plan Total Prescribed Dose: 45 Gy
Reference Point Dosage Given to Date: 18 Gy
Reference Point Session Dosage Given: 1.8 Gy
Session Number: 10

## 2023-03-20 NOTE — Progress Notes (Signed)
The proposed treatment discussed in conference is for discussion purpose only and is not a binding recommendation.  The patients have not been physically examined, or presented with their treatment options.  Therefore, final treatment plans cannot be decided.  

## 2023-03-21 ENCOUNTER — Ambulatory Visit
Admission: RE | Admit: 2023-03-21 | Discharge: 2023-03-21 | Disposition: A | Discharge status: Home / Self Care | Payer: No Typology Code available for payment source | Source: Ambulatory Visit | Attending: Radiation Oncology | Admitting: Radiation Oncology

## 2023-03-21 ENCOUNTER — Other Ambulatory Visit: Payer: Self-pay

## 2023-03-21 DIAGNOSIS — Z23 Encounter for immunization: Secondary | ICD-10-CM | POA: Diagnosis not present

## 2023-03-21 DIAGNOSIS — Z510 Encounter for antineoplastic radiation therapy: Secondary | ICD-10-CM | POA: Insufficient documentation

## 2023-03-21 DIAGNOSIS — C189 Malignant neoplasm of colon, unspecified: Secondary | ICD-10-CM | POA: Diagnosis not present

## 2023-03-21 DIAGNOSIS — C20 Malignant neoplasm of rectum: Secondary | ICD-10-CM | POA: Insufficient documentation

## 2023-03-21 DIAGNOSIS — R197 Diarrhea, unspecified: Secondary | ICD-10-CM | POA: Diagnosis not present

## 2023-03-21 DIAGNOSIS — N390 Urinary tract infection, site not specified: Secondary | ICD-10-CM | POA: Diagnosis not present

## 2023-03-21 LAB — RAD ONC ARIA SESSION SUMMARY
Course Elapsed Days: 17
Plan Fractions Treated to Date: 11
Plan Prescribed Dose Per Fraction: 1.8 Gy
Plan Total Fractions Prescribed: 25
Plan Total Prescribed Dose: 45 Gy
Reference Point Dosage Given to Date: 19.8 Gy
Reference Point Session Dosage Given: 1.8 Gy
Session Number: 11

## 2023-03-24 ENCOUNTER — Other Ambulatory Visit: Payer: Self-pay

## 2023-03-24 ENCOUNTER — Ambulatory Visit
Admission: RE | Admit: 2023-03-24 | Discharge: 2023-03-24 | Disposition: A | Discharge status: Home / Self Care | Payer: No Typology Code available for payment source | Source: Ambulatory Visit | Attending: Radiation Oncology | Admitting: Radiation Oncology

## 2023-03-24 DIAGNOSIS — Z510 Encounter for antineoplastic radiation therapy: Secondary | ICD-10-CM | POA: Diagnosis not present

## 2023-03-24 LAB — RAD ONC ARIA SESSION SUMMARY
Course Elapsed Days: 20
Plan Fractions Treated to Date: 12
Plan Prescribed Dose Per Fraction: 1.8 Gy
Plan Total Fractions Prescribed: 25
Plan Total Prescribed Dose: 45 Gy
Reference Point Dosage Given to Date: 21.6 Gy
Reference Point Session Dosage Given: 1.8 Gy
Session Number: 12

## 2023-03-25 ENCOUNTER — Telehealth: Payer: Self-pay | Admitting: Radiation Oncology

## 2023-03-25 ENCOUNTER — Ambulatory Visit: Payer: No Typology Code available for payment source

## 2023-03-25 DIAGNOSIS — R0602 Shortness of breath: Secondary | ICD-10-CM | POA: Diagnosis not present

## 2023-03-25 DIAGNOSIS — J9601 Acute respiratory failure with hypoxia: Secondary | ICD-10-CM | POA: Diagnosis not present

## 2023-03-25 NOTE — Telephone Encounter (Signed)
11/5 Patient called to cancel his treatment appointment for today due to having diarrhea and no sleep.  Email sent to Support RTT and copied L4 machine and Rober Minion, RN, so they are aware.

## 2023-03-26 ENCOUNTER — Other Ambulatory Visit: Payer: Self-pay

## 2023-03-26 ENCOUNTER — Ambulatory Visit
Admission: RE | Admit: 2023-03-26 | Discharge: 2023-03-26 | Disposition: A | Discharge status: Home / Self Care | Payer: No Typology Code available for payment source | Source: Ambulatory Visit | Attending: Radiation Oncology | Admitting: Radiation Oncology

## 2023-03-26 DIAGNOSIS — Z510 Encounter for antineoplastic radiation therapy: Secondary | ICD-10-CM | POA: Diagnosis not present

## 2023-03-26 LAB — RAD ONC ARIA SESSION SUMMARY
Course Elapsed Days: 22
Plan Fractions Treated to Date: 13
Plan Prescribed Dose Per Fraction: 1.8 Gy
Plan Total Fractions Prescribed: 25
Plan Total Prescribed Dose: 45 Gy
Reference Point Dosage Given to Date: 23.4 Gy
Reference Point Session Dosage Given: 1.8 Gy
Session Number: 13

## 2023-03-27 ENCOUNTER — Telehealth: Payer: Self-pay | Admitting: *Deleted

## 2023-03-27 ENCOUNTER — Other Ambulatory Visit: Payer: Self-pay

## 2023-03-27 ENCOUNTER — Other Ambulatory Visit: Payer: Self-pay | Admitting: Radiation Oncology

## 2023-03-27 ENCOUNTER — Ambulatory Visit
Admission: RE | Admit: 2023-03-27 | Discharge: 2023-03-27 | Disposition: A | Discharge status: Home / Self Care | Payer: No Typology Code available for payment source | Source: Ambulatory Visit | Attending: Radiation Oncology | Admitting: Radiation Oncology

## 2023-03-27 DIAGNOSIS — C20 Malignant neoplasm of rectum: Secondary | ICD-10-CM

## 2023-03-27 DIAGNOSIS — Z510 Encounter for antineoplastic radiation therapy: Secondary | ICD-10-CM | POA: Diagnosis not present

## 2023-03-27 LAB — RAD ONC ARIA SESSION SUMMARY
Course Elapsed Days: 23
Plan Fractions Treated to Date: 14
Plan Prescribed Dose Per Fraction: 1.8 Gy
Plan Total Fractions Prescribed: 25
Plan Total Prescribed Dose: 45 Gy
Reference Point Dosage Given to Date: 25.2 Gy
Reference Point Session Dosage Given: 1.8 Gy
Session Number: 14

## 2023-03-27 NOTE — Telephone Encounter (Signed)
Received a call from Dr. Walden Field at the [facility].  He is requesting labs for this patient when he comes in for his radiation treatment.  PA made aware and requested labs placed.    Lind Covert RN, BSN

## 2023-03-28 ENCOUNTER — Ambulatory Visit: Payer: No Typology Code available for payment source

## 2023-03-31 ENCOUNTER — Other Ambulatory Visit: Payer: Self-pay

## 2023-03-31 ENCOUNTER — Other Ambulatory Visit: Payer: Self-pay | Admitting: Radiation Oncology

## 2023-03-31 ENCOUNTER — Encounter: Payer: Self-pay | Admitting: Radiation Oncology

## 2023-03-31 ENCOUNTER — Ambulatory Visit
Admission: RE | Admit: 2023-03-31 | Discharge: 2023-03-31 | Disposition: A | Discharge status: Home / Self Care | Payer: No Typology Code available for payment source | Source: Ambulatory Visit | Attending: Radiation Oncology | Admitting: Radiation Oncology

## 2023-03-31 ENCOUNTER — Ambulatory Visit
Admission: RE | Admit: 2023-03-31 | Discharge: 2023-03-31 | Discharge status: Home / Self Care | Payer: No Typology Code available for payment source | Source: Ambulatory Visit | Attending: Radiation Oncology

## 2023-03-31 DIAGNOSIS — C20 Malignant neoplasm of rectum: Secondary | ICD-10-CM

## 2023-03-31 DIAGNOSIS — Z510 Encounter for antineoplastic radiation therapy: Secondary | ICD-10-CM | POA: Diagnosis not present

## 2023-03-31 LAB — RAD ONC ARIA SESSION SUMMARY
Course Elapsed Days: 27
Plan Fractions Treated to Date: 15
Plan Prescribed Dose Per Fraction: 1.8 Gy
Plan Total Fractions Prescribed: 25
Plan Total Prescribed Dose: 45 Gy
Reference Point Dosage Given to Date: 27 Gy
Reference Point Session Dosage Given: 1.8 Gy
Session Number: 15

## 2023-03-31 LAB — C DIFFICILE QUICK SCREEN W PCR REFLEX
C Diff antigen: NEGATIVE
C Diff interpretation: NOT DETECTED
C Diff toxin: NEGATIVE

## 2023-03-31 MED ORDER — FLUCONAZOLE 150 MG PO TABS: ORAL_TABLET | ORAL | 0 refills | Status: DC

## 2023-03-31 NOTE — Progress Notes (Signed)
The patient was seen today in the treatment area for assessment.  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  He has completed 15 of the planned 28 fractions and is having trouble with urinary and bowel function.  He has been having looser stools and last week was having incontinence with accidents throughout the department.  He had been taking Lomotil 4 times a day and was encouraged to increase this to 6 times a day and his oncology team asked that he give a specimen to test for C. difficile.  He had testing but this was felt to be a contaminated specimen and was asked to do this again but was unable to do this during his last encounter in their clinic and because of this they reached out to [place] to see if we could order this.  The patient has not been able to give a specimen.  Of more concern, the patient is having significant scrotal edema and erythema and drainage in the groin fold on the right side.  It appears that the patient was recently on antibiotics around the time of his TAVR.  He states that in the last week he has developed increasing edema redness and burning sensation of the skin around the scrotum and into the groin folds bilaterally worse on the right.  On inspection there is debris that appears to be consistent with yeast but also desquamation of the skin in the right groin fold.  The scrotum is approximately 3 times the size of normal, and he has pitting edema throughout the lower extremities bilaterally with compression hose to the knee.  We discussed the use of topical miconazole powder which we were able to provide to him in the clinic to use 3 times a day and also discussed the rationale for Diflucan which can be sent to his pharmacy but will likely be delayed in starting because he needs this medicine delivered.  We will reach out to his cardiology team who he also sees later this week but it appears that he has had to self catheterize himself a few times a day  but has been less likely to do this because of the discomfort of the skin.  He also reports that he has not been taking his Lasix as prescribed by cardiology because of the concerns of having to get up and go to the bathroom so frequently.  I encouraged him to try and focus on this and to discuss this with his cardiology team as we continue on with radiation.  He is in agreement with this plan     Osker Mason, PAC

## 2023-04-01 ENCOUNTER — Other Ambulatory Visit: Payer: Self-pay

## 2023-04-01 ENCOUNTER — Ambulatory Visit
Admission: RE | Admit: 2023-04-01 | Discharge: 2023-04-01 | Disposition: A | Discharge status: Home / Self Care | Payer: No Typology Code available for payment source | Source: Ambulatory Visit | Attending: Radiation Oncology | Admitting: Radiation Oncology

## 2023-04-01 DIAGNOSIS — Z510 Encounter for antineoplastic radiation therapy: Secondary | ICD-10-CM | POA: Diagnosis not present

## 2023-04-01 LAB — RAD ONC ARIA SESSION SUMMARY
Course Elapsed Days: 28
Plan Fractions Treated to Date: 16
Plan Prescribed Dose Per Fraction: 1.8 Gy
Plan Total Fractions Prescribed: 25
Plan Total Prescribed Dose: 45 Gy
Reference Point Dosage Given to Date: 28.8 Gy
Reference Point Session Dosage Given: 1.8 Gy
Session Number: 16

## 2023-04-02 ENCOUNTER — Ambulatory Visit: Payer: No Typology Code available for payment source

## 2023-04-02 ENCOUNTER — Other Ambulatory Visit: Payer: Self-pay

## 2023-04-02 ENCOUNTER — Other Ambulatory Visit (HOSPITAL_COMMUNITY): Payer: No Typology Code available for payment source

## 2023-04-02 ENCOUNTER — Ambulatory Visit
Admission: RE | Admit: 2023-04-02 | Discharge: 2023-04-02 | Disposition: A | Discharge status: Home / Self Care | Payer: No Typology Code available for payment source | Source: Ambulatory Visit | Attending: Radiation Oncology | Admitting: Radiation Oncology

## 2023-04-02 ENCOUNTER — Ambulatory Visit: Payer: No Typology Code available for payment source | Attending: Internal Medicine

## 2023-04-02 DIAGNOSIS — Z510 Encounter for antineoplastic radiation therapy: Secondary | ICD-10-CM | POA: Diagnosis not present

## 2023-04-02 LAB — RAD ONC ARIA SESSION SUMMARY
Course Elapsed Days: 29
Plan Fractions Treated to Date: 17
Plan Prescribed Dose Per Fraction: 1.8 Gy
Plan Total Fractions Prescribed: 25
Plan Total Prescribed Dose: 45 Gy
Reference Point Dosage Given to Date: 30.6 Gy
Reference Point Session Dosage Given: 1.8 Gy
Session Number: 17

## 2023-04-02 LAB — GASTROINTESTINAL PANEL BY PCR, STOOL (REPLACES STOOL CULTURE)

## 2023-04-02 NOTE — Progress Notes (Unsigned)
This encounter was created in error - please disregard.

## 2023-04-03 ENCOUNTER — Encounter: Payer: Self-pay | Admitting: Physician Assistant

## 2023-04-03 ENCOUNTER — Ambulatory Visit: Payer: No Typology Code available for payment source

## 2023-04-04 ENCOUNTER — Ambulatory Visit: Payer: No Typology Code available for payment source

## 2023-04-05 ENCOUNTER — Encounter (HOSPITAL_COMMUNITY): Payer: Self-pay

## 2023-04-05 ENCOUNTER — Emergency Department (HOSPITAL_COMMUNITY)
Admission: EM | Admit: 2023-04-05 | Discharge: 2023-04-05 | Disposition: A | Discharge status: Home / Self Care | Payer: No Typology Code available for payment source | Attending: Emergency Medicine | Admitting: Emergency Medicine

## 2023-04-05 DIAGNOSIS — C20 Malignant neoplasm of rectum: Secondary | ICD-10-CM | POA: Insufficient documentation

## 2023-04-05 DIAGNOSIS — Z79899 Other long term (current) drug therapy: Secondary | ICD-10-CM | POA: Insufficient documentation

## 2023-04-05 DIAGNOSIS — R159 Full incontinence of feces: Secondary | ICD-10-CM | POA: Insufficient documentation

## 2023-04-05 DIAGNOSIS — N3000 Acute cystitis without hematuria: Secondary | ICD-10-CM

## 2023-04-05 DIAGNOSIS — R3915 Urgency of urination: Secondary | ICD-10-CM | POA: Insufficient documentation

## 2023-04-05 DIAGNOSIS — R350 Frequency of micturition: Secondary | ICD-10-CM | POA: Insufficient documentation

## 2023-04-05 DIAGNOSIS — I10 Essential (primary) hypertension: Secondary | ICD-10-CM | POA: Diagnosis not present

## 2023-04-05 DIAGNOSIS — R32 Unspecified urinary incontinence: Secondary | ICD-10-CM | POA: Diagnosis present

## 2023-04-05 DIAGNOSIS — Z7901 Long term (current) use of anticoagulants: Secondary | ICD-10-CM | POA: Insufficient documentation

## 2023-04-05 LAB — CBC WITH DIFFERENTIAL/PLATELET
Abs Immature Granulocytes: 0.01 10*3/uL (ref 0.00–0.07)
Basophils Absolute: 0 10*3/uL (ref 0.0–0.1)
Basophils Relative: 1 %
Eosinophils Absolute: 0 10*3/uL (ref 0.0–0.5)
Eosinophils Relative: 0 %
HCT: 35.1 % — ABNORMAL LOW (ref 39.0–52.0)
Hemoglobin: 10.8 g/dL — ABNORMAL LOW (ref 13.0–17.0)
Immature Granulocytes: 0 %
Lymphocytes Relative: 10 %
Lymphs Abs: 0.3 10*3/uL — ABNORMAL LOW (ref 0.7–4.0)
MCH: 27.6 pg (ref 26.0–34.0)
MCHC: 30.8 g/dL (ref 30.0–36.0)
MCV: 89.8 fL (ref 80.0–100.0)
Monocytes Absolute: 0.5 10*3/uL (ref 0.1–1.0)
Monocytes Relative: 16 %
Neutro Abs: 2.3 10*3/uL (ref 1.7–7.7)
Neutrophils Relative %: 73 %
Platelets: 117 10*3/uL — ABNORMAL LOW (ref 150–400)
RBC: 3.91 MIL/uL — ABNORMAL LOW (ref 4.22–5.81)
RDW: 21 % — ABNORMAL HIGH (ref 11.5–15.5)
WBC: 3.1 10*3/uL — ABNORMAL LOW (ref 4.0–10.5)
nRBC: 0 % (ref 0.0–0.2)

## 2023-04-05 LAB — BASIC METABOLIC PANEL
Anion gap: 8 (ref 5–15)
BUN: 18 mg/dL (ref 8–23)
CO2: 24 mmol/L (ref 22–32)
Calcium: 8.5 mg/dL — ABNORMAL LOW (ref 8.9–10.3)
Chloride: 107 mmol/L (ref 98–111)
Creatinine, Ser: 0.95 mg/dL (ref 0.61–1.24)
GFR, Estimated: >60 mL/min (ref >60)
Glucose, Bld: 132 mg/dL — ABNORMAL HIGH (ref 70–99)
Potassium: 4.2 mmol/L (ref 3.5–5.1)
Sodium: 139 mmol/L (ref 135–145)

## 2023-04-05 LAB — URINALYSIS, ROUTINE W REFLEX MICROSCOPIC
Bilirubin Urine: NEGATIVE
Glucose, UA: NEGATIVE mg/dL
Ketones, ur: NEGATIVE mg/dL
Nitrite: NEGATIVE
Protein, ur: 100 mg/dL — AB
Specific Gravity, Urine: 1.012 (ref 1.005–1.030)
pH: 6 (ref 5.0–8.0)

## 2023-04-05 MED ORDER — CEFTRIAXONE SODIUM 1 G IJ SOLR
1.0000 g | Freq: Once | INTRAMUSCULAR | Status: AC
  Administered 2023-04-05: 1 g via INTRAMUSCULAR
  Filled 2023-04-05: qty 10

## 2023-04-05 MED ORDER — CEPHALEXIN 500 MG PO CAPS: 500.0000 mg | ORAL_CAPSULE | Freq: Three times a day (TID) | ORAL | 0 refills | Status: DC

## 2023-04-05 MED ORDER — STERILE WATER FOR INJECTION IJ SOLN
INTRAMUSCULAR | Status: AC
  Administered 2023-04-05: 10 mL
  Filled 2023-04-05: qty 10

## 2023-04-05 NOTE — Discharge Instructions (Addendum)
Begin taking Keflex as prescribed.  Follow-up with primary doctor if symptoms are not improving in the next few days.

## 2023-04-05 NOTE — ED Provider Notes (Signed)
[place] EMERGENCY DEPARTMENT AT [facility] Provider Note   CSN: [id number] Arrival date & time: 04/05/23  0243     History  Chief Complaint  Patient presents with   Urinary Incontinence    Gary Carroll is a 77 y.o. male.  Patient is a 77 year old male with history of rectal adenocarcinoma currently undergoing radiation therapy.  Patient also has history of atrial fibrillation on Eliquis, total knee replacement, hypertension.  Patient presenting today for evaluation of urinary and bowel incontinence.  For the past week he reports having an urge to urinate, then urinates before he can make it to the bathroom.  He denies any dysuria.  He denies any fevers, chills, or back pain.  He is also reported several episodes of stool incontinence and is having irritation to his rectum and testicles.  He was given antifungal pill and powder by his primary doctor, but this is not helping.  Patient presents here stating that he is "exhausted".  The history is provided by the patient.       Home Medications Prior to Admission medications   Medication Sig Start Date End Date Taking? Authorizing Provider  acetaminophen (TYLENOL) 650 MG CR tablet Take 1,300 mg by mouth every 8 (eight) hours as needed for pain.    [provider]  amoxicillin (AMOXIL) 500 MG tablet Take 2,000 mg by mouth as directed. take 4 tablets by mouth 1 hour prior to dental procedures and cleanings.    [provider]  apixaban (ELIQUIS) 5 MG TABS tablet Take 1 tablet (5 mg total) by mouth 2 (two) times daily. 08/24/15   Philip Aspen, Limmie Patricia, MD  cephALEXin (KEFLEX) 500 MG capsule Take 1 capsule (500 mg total) by mouth 3 (three) times daily. 03/14/23   Jacalyn Lefevre, MD  diltiazem (CARDIZEM CD) 120 MG 24 hr capsule Take 1 capsule (120 mg total) by mouth daily. 01/18/23   Dione Booze, MD  dutasteride (AVODART) 0.5 MG capsule Take 0.5 mg by mouth daily.    [provider]   fluconazole (DIFLUCAN) 150 MG tablet Take 1 tab po on day. 1 Take 1 tab po on day 4. Take 1 tab po on day 7 03/31/23   Ronny Bacon, PA-C  furosemide (LASIX) 40 MG tablet Take 1 tablet (40 mg total) by mouth 2 (two) times daily. You can take an additional 40mg  of Lasix as needed for shortness of breath or swelling Patient taking differently: Take 40 mg by mouth daily. 02/06/23   Janetta Hora, PA-C  losartan (COZAAR) 50 MG tablet Take 0.5 tablets (25 mg total) by mouth daily. Patient taking differently: Take 50 mg by mouth daily. 12/23/22   Vassie Loll, MD  Magnesium 500 MG CAPS Take 500 mg by mouth daily.    [provider]  Melatonin 10 MG CAPS Take 10 mg by mouth at bedtime.    [provider]  metFORMIN (GLUCOPHAGE) 500 MG tablet Take 500 mg by mouth 2 (two) times daily with a meal.    [provider]  metoprolol tartrate (LOPRESSOR) 50 MG tablet Take 1 tablet (50 mg total) by mouth 2 (two) times daily. 01/18/23   Dione Booze, MD  Multiple Vitamins-Minerals (MULTIVITAL) tablet Take 1 tablet by mouth daily.    [provider]  Omega-3 Fatty Acids (FISH OIL) 1000 MG CAPS Take 2,000 mg by mouth daily.    [provider]  oxybutynin (DITROPAN) 5 MG tablet Take 5 mg by mouth every  evening. 06/01/19   [provider]  pantoprazole (PROTONIX) 40 MG tablet Take 40 mg by mouth daily.    [provider]  phenazopyridine (PYRIDIUM) 200 MG tablet Take 1 tablet (200 mg total) by mouth 3 (three) times daily. 03/14/23   Jacalyn Lefevre, MD  potassium chloride (KLOR-CON) 10 MEQ tablet Take 1 tablet (10 mEq total) by mouth daily. 02/06/23   Janetta Hora, PA-C  rosuvastatin (CRESTOR) 20 MG tablet Take 20 mg by mouth daily.    [provider]  vitamin C (ASCORBIC ACID) 500 MG tablet Take 500 mg by mouth daily.    [provider]      Allergies    Diovan [valsartan] and Tagamet [cimetidine]    Review of  Systems   Review of Systems  All other systems reviewed and are negative.   Physical Exam Updated Vital Signs BP 130/68   Pulse 96   Temp 99.2 F (37.3 C)   Resp 18   SpO2 95%  Physical Exam Vitals and nursing note reviewed.  Constitutional:      General: He is not in acute distress.    Appearance: He is well-developed. He is not diaphoretic.  HENT:     Head: Normocephalic and atraumatic.  Cardiovascular:     Rate and Rhythm: Normal rate and regular rhythm.     Heart sounds: No murmur heard.    No friction rub.  Pulmonary:     Effort: Pulmonary effort is normal. No respiratory distress.     Breath sounds: Normal breath sounds. No wheezing or rales.  Abdominal:     General: Bowel sounds are normal. There is no distension.     Palpations: Abdomen is soft.     Tenderness: There is no abdominal tenderness.  Genitourinary:    Comments: Inspection of the perirectal skin and perineal tissues reveals redness and erythema, but no decubitus sores. Musculoskeletal:        General: Normal range of motion.     Cervical back: Normal range of motion and neck supple.  Skin:    General: Skin is warm and dry.  Neurological:     Mental Status: He is alert and oriented to person, place, and time.     Coordination: Coordination normal.     ED Results / Procedures / Treatments   Labs (all labs ordered are listed, but only abnormal results are displayed) Labs Reviewed - No data to display  EKG None  Radiology No results found.  Procedures Procedures    Medications Ordered in ED Medications - No data to display  ED Course/ Medical Decision Making/ A&P  Patient is a 77 year old male presenting with urinary frequency and urgency.  He reports performing self caths at home, however feels as though he is unable to maintain his urine.  He also reports episodes of fecal incontinence and is undergoing radiation for rectal adenocarcinoma.  Patient arrives here with stable vital signs.   Physical examination reveals some irritation to the skin of the perineum, but no ulcers or significant breakdown.  Laboratory studies obtained including CBC and basic metabolic panel which are basically unremarkable.  Urinalysis reveals many white cells and white cell clumping consistent with UTI.  I suspect a UTI to be the cause of his urinary issues.  Patient to be treated with antibiotics and discharged home.  Final Clinical Impression(s) / ED Diagnoses Final diagnoses:  None    Rx / DC Orders ED Discharge Orders     None  Geoffery Lyons, MD 04/05/23 [phone]

## 2023-04-05 NOTE — ED Triage Notes (Signed)
Pt comes via [facility] for urinary and bowel incontinence that has  been going on for the past week.

## 2023-04-07 ENCOUNTER — Ambulatory Visit: Payer: No Typology Code available for payment source

## 2023-04-07 ENCOUNTER — Ambulatory Visit
Admission: RE | Admit: 2023-04-07 | Discharge: 2023-04-07 | Disposition: A | Discharge status: Home / Self Care | Payer: No Typology Code available for payment source | Source: Ambulatory Visit | Attending: Radiation Oncology | Admitting: Radiation Oncology

## 2023-04-07 ENCOUNTER — Ambulatory Visit: Payer: Self-pay | Admitting: *Deleted

## 2023-04-07 ENCOUNTER — Other Ambulatory Visit: Payer: Self-pay

## 2023-04-07 ENCOUNTER — Other Ambulatory Visit (HOSPITAL_COMMUNITY): Payer: No Typology Code available for payment source

## 2023-04-07 DIAGNOSIS — Z510 Encounter for antineoplastic radiation therapy: Secondary | ICD-10-CM | POA: Diagnosis not present

## 2023-04-07 LAB — RAD ONC ARIA SESSION SUMMARY
Course Elapsed Days: 34
Plan Fractions Treated to Date: 18
Plan Prescribed Dose Per Fraction: 1.8 Gy
Plan Total Fractions Prescribed: 25
Plan Total Prescribed Dose: 45 Gy
Reference Point Dosage Given to Date: 32.4 Gy
Reference Point Session Dosage Given: 1.8 Gy
Session Number: 18

## 2023-04-07 NOTE — Patient Instructions (Addendum)
 Visit Information  Thank you for taking time to visit with me today. Please don't hesitate to contact me if I can be of assistance to you.   Following are the goals we discussed today:   Goals Addressed             This Visit's Progress    Maintain his health at home related to Cardiac and cancer diagnoses (TAVR/PAF/CHF, colon cancer)  - care coordination services       Patient will outreach to RN CM if develop concerns with obtaining medications Patient will outreach to care coordination team if extra support or transportation services needed    Interventions Today    Flowsheet Row Most Recent Value  Chronic Disease   Chronic disease during today's visit Congestive Heart Failure (CHF), Other  [Fatigue & Dyspnea on exertion today, Recent new heart valve/TAVR  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, chemo started this week on 03/04/23]  General Interventions   General Interventions Discussed/Reviewed General Interventions Discussed, Durable Medical Equipment (DME), Doctor Visits, Community Resources  Doctor Visits Discussed/Reviewed Doctor Visits Discussed, PCP, Database administrator (DME) Glucomoter, Other  [eye glasses]  PCP/Specialist Visits Compliance with follow-up visit  Exercise Interventions   Exercise Discussed/Reviewed Exercise Discussed, Physical Activity, Assistive device use and maintanence  Physical Activity Discussed/Reviewed Physical Activity Discussed  Education Interventions   Education Provided --  [reviewed transportation benefits as possible alternative to get him to medical appointments Discussed coordination services for possible extra support resources as needed]  Provided Verbal Education On Medication, Sick Day Rules, [employer] Resources  [encouraged rest, reviewed VBCI care coordination program and  team members]  Mental Health Interventions   Mental Health Discussed/Reviewed Mental Health Discussed, Coping  Strategies  Pharmacy Interventions   Pharmacy Dicussed/Reviewed Pharmacy Topics Discussed, Medications and their functions, Affording Medications  [facility]   [facility] less than [facility]  He is too fatigue today to review all medicines]  Safety Interventions   Safety Discussed/Reviewed Safety Discussed, Home Safety  Home Safety Assistive Devices              Our next appointment is by telephone on 09/05/23 at 1100  Please call the care guide team at [phone] if you need to cancel or reschedule your appointment.   If you are experiencing a Mental Health or Behavioral Health Crisis or need someone to talk to, please call the Suicide and Crisis Lifeline: 988 call the [place] National Suicide Prevention Lifeline: (503) 522-7601 or TTY: 332-527-2097 TTY (618)232-4929) to talk to a trained counselor call 1-800-273-TALK (toll free, 24 hour hotline) call the [facility]: ([phone] call 911   Patient verbalizes understanding of instructions and care plan provided today and agrees to view in MyChart. Active MyChart status and patient understanding of how to access instructions and care plan via MyChart confirmed with patient.     The patient has been provided with contact information for the care management team and has been advised to call with any health related questions or concerns.   Sharvi Mooneyhan L. Noelle Penner, RN, BSN, [facility]  VBCI Care Management Coordinator  [phone]  Fax: [phone]

## 2023-04-07 NOTE — Patient Outreach (Signed)
  Care Coordination   Follow Up Visit Note   08/22/2023 updated note for 04/07/23 Name: Gary Carroll MRN: [id number] DOB: Dec 10, 1945  Gary Carroll is a 77 y.o. year old male who sees Margo Aye, Kathleene Hazel, MD for primary care. I spoke with  [name] by phone today.  What matters to the patients health and wellness today?  He reports he remains tired/fatigue "exhausted" from going to treatments in [place] 5 times a week Transportation to and from appointments have been scheduled via [facility]   history of rectal adenocarcinoma currently undergoing radiation therapy, atrial fibrillation, right knee replacement, urinary retention, hypertension, urinary & bowel incontinence  Atrial fibrillation Denies worsening symptoms but audible shortness of breath noted. He is able to speak in complete sentences but pauses mid way    ED visit on 04/05/23 for  UTI (recurrent). Caths with straight catheters  He has been completing this independently for years, Uses wipes, hand sanitizer     Goals Addressed             This Visit's Progress    Maintain his health at home related to Cardiac and cancer diagnoses (TAVR/PAF/CHF, colon cancer)  - care coordination services       Patient will outreach to RN CM if develop concerns with obtaining medications Patient will outreach to care coordination team if extra support or transportation services needed    Interventions Today    Flowsheet Row Most Recent Value  Chronic Disease   Chronic disease during today's visit Congestive Heart Failure (CHF), Other  [Fatigue & Dyspnea on exertion today, Recent new heart valve/TAVR  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, chemo started this week on 03/04/23]  General Interventions   General Interventions Discussed/Reviewed General Interventions Discussed, Durable Medical Equipment (DME), Doctor Visits, Community Resources  Doctor Visits Discussed/Reviewed Doctor  Visits Discussed, PCP, Database administrator (DME) Glucomoter, Other  [eye glasses]  PCP/Specialist Visits Compliance with follow-up visit  Exercise Interventions   Exercise Discussed/Reviewed Exercise Discussed, Physical Activity, Assistive device use and maintanence  Physical Activity Discussed/Reviewed Physical Activity Discussed  Education Interventions   Education Provided --  [reviewed transportation benefits as possible alternative to get him to medical appointments Discussed coordination services for possible extra support resources as needed]  Provided Verbal Education On Medication, Sick Day Rules, [employer] Resources  [encouraged rest, reviewed VBCI care coordination program and  team members]  Mental Health Interventions   Mental Health Discussed/Reviewed Mental Health Discussed, Coping Strategies  Pharmacy Interventions   Pharmacy Dicussed/Reviewed Pharmacy Topics Discussed, Medications and their functions, Affording Medications  [[facility] not having some medicines ordered   Most pills at [facility] less than $5  He is too fatigue today to review all medicines]  Safety Interventions   Safety Discussed/Reviewed Safety Discussed, Home Safety  Home Safety Assistive Devices              SDOH assessments and interventions completed:  No     Care Coordination Interventions:  Yes, provided   Follow up plan: Follow up call scheduled for 09/05/23    Encounter Outcome:  Patient Visit Completed   Cala Bradford L. Noelle Penner, RN, BSN, [facility]  VBCI Care Management Coordinator  [phone]  Fax: [phone]

## 2023-04-08 ENCOUNTER — Inpatient Hospital Stay: Payer: No Typology Code available for payment source | Attending: Internal Medicine

## 2023-04-08 ENCOUNTER — Ambulatory Visit: Payer: No Typology Code available for payment source

## 2023-04-08 ENCOUNTER — Ambulatory Visit
Admission: RE | Admit: 2023-04-08 | Discharge: 2023-04-08 | Discharge status: Home / Self Care | Payer: No Typology Code available for payment source | Source: Ambulatory Visit | Attending: Radiation Oncology

## 2023-04-08 ENCOUNTER — Ambulatory Visit
Admission: RE | Admit: 2023-04-08 | Discharge: 2023-04-08 | Disposition: A | Discharge status: Home / Self Care | Payer: No Typology Code available for payment source | Source: Ambulatory Visit | Attending: Radiation Oncology | Admitting: Radiation Oncology

## 2023-04-08 ENCOUNTER — Other Ambulatory Visit: Payer: Self-pay

## 2023-04-08 DIAGNOSIS — Z510 Encounter for antineoplastic radiation therapy: Secondary | ICD-10-CM | POA: Diagnosis not present

## 2023-04-08 LAB — RAD ONC ARIA SESSION SUMMARY
Course Elapsed Days: 35
Plan Fractions Treated to Date: 19
Plan Prescribed Dose Per Fraction: 1.8 Gy
Plan Total Fractions Prescribed: 25
Plan Total Prescribed Dose: 45 Gy
Reference Point Dosage Given to Date: 34.2 Gy
Reference Point Session Dosage Given: 1.8 Gy
Session Number: 19

## 2023-04-09 ENCOUNTER — Ambulatory Visit: Payer: No Typology Code available for payment source

## 2023-04-09 ENCOUNTER — Telehealth: Payer: Self-pay | Admitting: Radiation Oncology

## 2023-04-09 ENCOUNTER — Inpatient Hospital Stay: Payer: No Typology Code available for payment source

## 2023-04-09 ENCOUNTER — Other Ambulatory Visit (HOSPITAL_COMMUNITY): Payer: No Typology Code available for payment source

## 2023-04-09 NOTE — Telephone Encounter (Signed)
11/20 @ 8:39 am patient called to cancel his treatment appointment for today due to having diarrhea all this morning.  Email sent to Support RTT/L4 machine copied Christian Vilsaint/Latoya Edward Jolly, so they are aware.

## 2023-04-10 ENCOUNTER — Ambulatory Visit: Payer: No Typology Code available for payment source

## 2023-04-10 ENCOUNTER — Inpatient Hospital Stay: Payer: No Typology Code available for payment source

## 2023-04-10 ENCOUNTER — Telehealth (HOSPITAL_COMMUNITY): Payer: Self-pay

## 2023-04-10 ENCOUNTER — Other Ambulatory Visit: Payer: Self-pay

## 2023-04-10 ENCOUNTER — Ambulatory Visit
Admission: RE | Admit: 2023-04-10 | Discharge: 2023-04-10 | Disposition: A | Discharge status: Home / Self Care | Payer: No Typology Code available for payment source | Source: Ambulatory Visit | Attending: Radiation Oncology | Admitting: Radiation Oncology

## 2023-04-10 DIAGNOSIS — Z510 Encounter for antineoplastic radiation therapy: Secondary | ICD-10-CM | POA: Diagnosis not present

## 2023-04-10 LAB — RAD ONC ARIA SESSION SUMMARY
Course Elapsed Days: 37
Plan Fractions Treated to Date: 20
Plan Prescribed Dose Per Fraction: 1.8 Gy
Plan Total Fractions Prescribed: 25
Plan Total Prescribed Dose: 45 Gy
Reference Point Dosage Given to Date: 36 Gy
Reference Point Session Dosage Given: 1.8 Gy
Session Number: 20

## 2023-04-10 NOTE — Telephone Encounter (Signed)
Called patient regarding his referral for Cardiac Rehab. He has not finished his cancer treatments for rectal CA. He plans to go back to swimming after his treatments but would come through the [place] if he decides to participating in the program. He is not interested at this time.

## 2023-04-11 ENCOUNTER — Other Ambulatory Visit: Payer: Self-pay

## 2023-04-11 ENCOUNTER — Ambulatory Visit: Payer: No Typology Code available for payment source

## 2023-04-11 ENCOUNTER — Ambulatory Visit
Admission: RE | Admit: 2023-04-11 | Discharge: 2023-04-11 | Disposition: A | Discharge status: Home / Self Care | Payer: No Typology Code available for payment source | Source: Ambulatory Visit | Attending: Radiation Oncology | Admitting: Radiation Oncology

## 2023-04-11 ENCOUNTER — Inpatient Hospital Stay: Payer: No Typology Code available for payment source

## 2023-04-11 DIAGNOSIS — Z510 Encounter for antineoplastic radiation therapy: Secondary | ICD-10-CM | POA: Diagnosis not present

## 2023-04-11 LAB — RAD ONC ARIA SESSION SUMMARY
Course Elapsed Days: 38
Plan Fractions Treated to Date: 21
Plan Prescribed Dose Per Fraction: 1.8 Gy
Plan Total Fractions Prescribed: 25
Plan Total Prescribed Dose: 45 Gy
Reference Point Dosage Given to Date: 37.8 Gy
Reference Point Session Dosage Given: 1.8 Gy
Session Number: 21

## 2023-04-13 ENCOUNTER — Ambulatory Visit: Payer: No Typology Code available for payment source

## 2023-04-14 ENCOUNTER — Ambulatory Visit: Payer: No Typology Code available for payment source

## 2023-04-15 ENCOUNTER — Ambulatory Visit: Payer: No Typology Code available for payment source

## 2023-04-16 ENCOUNTER — Other Ambulatory Visit: Payer: Self-pay

## 2023-04-16 ENCOUNTER — Ambulatory Visit: Payer: No Typology Code available for payment source

## 2023-04-16 ENCOUNTER — Ambulatory Visit
Admission: RE | Admit: 2023-04-16 | Discharge: 2023-04-16 | Disposition: A | Discharge status: Home / Self Care | Payer: No Typology Code available for payment source | Source: Ambulatory Visit | Attending: Radiation Oncology | Admitting: Radiation Oncology

## 2023-04-16 DIAGNOSIS — Z510 Encounter for antineoplastic radiation therapy: Secondary | ICD-10-CM | POA: Diagnosis not present

## 2023-04-16 LAB — RAD ONC ARIA SESSION SUMMARY
Course Elapsed Days: 43
Plan Fractions Treated to Date: 22
Plan Prescribed Dose Per Fraction: 1.8 Gy
Plan Total Fractions Prescribed: 25
Plan Total Prescribed Dose: 45 Gy
Reference Point Dosage Given to Date: 39.6 Gy
Reference Point Session Dosage Given: 1.8 Gy
Session Number: 22

## 2023-04-16 NOTE — Progress Notes (Signed)
Patient was seen on the treatment machine.  He reports he is having increased diarrhea for part of the day.  He states he has been taking imodium, has taken 3 so far this morning.  He states as the day goes on his diarrhea stops.  We discussed his medications and diet.  He was encouraged to eat a BRAT diet and to continue taking the imodium as needed up to 8 tablets per day.  We discussed he could also add in some Pepto bismol and to make sure he continues to drink plenty of fluids.  Patient verbalized understanding.  Lind Covert RN, BSN

## 2023-04-21 ENCOUNTER — Other Ambulatory Visit: Payer: Self-pay

## 2023-04-21 ENCOUNTER — Inpatient Hospital Stay: Payer: No Typology Code available for payment source | Attending: Internal Medicine

## 2023-04-21 ENCOUNTER — Ambulatory Visit: Payer: No Typology Code available for payment source

## 2023-04-21 ENCOUNTER — Ambulatory Visit
Admission: RE | Admit: 2023-04-21 | Discharge: 2023-04-21 | Disposition: A | Discharge status: Home / Self Care | Payer: No Typology Code available for payment source | Source: Ambulatory Visit | Attending: Radiation Oncology | Admitting: Radiation Oncology

## 2023-04-21 DIAGNOSIS — Z510 Encounter for antineoplastic radiation therapy: Secondary | ICD-10-CM | POA: Diagnosis present

## 2023-04-21 DIAGNOSIS — Z952 Presence of prosthetic heart valve: Secondary | ICD-10-CM

## 2023-04-21 DIAGNOSIS — C20 Malignant neoplasm of rectum: Secondary | ICD-10-CM | POA: Diagnosis present

## 2023-04-21 LAB — RAD ONC ARIA SESSION SUMMARY
Course Elapsed Days: 48
Plan Fractions Treated to Date: 23
Plan Prescribed Dose Per Fraction: 1.8 Gy
Plan Total Fractions Prescribed: 25
Plan Total Prescribed Dose: 45 Gy
Reference Point Dosage Given to Date: 41.4 Gy
Reference Point Session Dosage Given: 1.8 Gy
Session Number: 23

## 2023-04-22 ENCOUNTER — Telehealth: Payer: Self-pay | Admitting: Physician Assistant

## 2023-04-22 ENCOUNTER — Ambulatory Visit
Admission: RE | Admit: 2023-04-22 | Discharge: 2023-04-22 | Disposition: A | Discharge status: Home / Self Care | Payer: No Typology Code available for payment source | Source: Ambulatory Visit | Attending: Radiation Oncology | Admitting: Radiation Oncology

## 2023-04-22 ENCOUNTER — Other Ambulatory Visit: Payer: Self-pay

## 2023-04-22 ENCOUNTER — Other Ambulatory Visit: Payer: Self-pay | Admitting: Physician Assistant

## 2023-04-22 ENCOUNTER — Ambulatory Visit: Payer: No Typology Code available for payment source

## 2023-04-22 DIAGNOSIS — Z510 Encounter for antineoplastic radiation therapy: Secondary | ICD-10-CM | POA: Diagnosis not present

## 2023-04-22 LAB — RAD ONC ARIA SESSION SUMMARY
Course Elapsed Days: 49
Plan Fractions Treated to Date: 24
Plan Prescribed Dose Per Fraction: 1.8 Gy
Plan Total Fractions Prescribed: 25
Plan Total Prescribed Dose: 45 Gy
Reference Point Dosage Given to Date: 43.2 Gy
Reference Point Session Dosage Given: 1.8 Gy
Session Number: 24

## 2023-04-22 NOTE — Addendum Note (Signed)
Addended by: Janetta Hora on: 04/22/2023 08:42 AM   Modules accepted: Orders

## 2023-04-22 NOTE — Telephone Encounter (Signed)
  [facility]   MULTIDISCIPLINARY HEART VALVE TEAM  Due to issues with radiation apts and transportation, his 1 month echo and visit have been moved several times. Due to scheduling issues, we could not get an office visit within the 75 day window so I did his KCCQ and NYHA class over the phone.   [place] Cardiomyopathy Questionnaire     04/22/2023    8:33 AM 01/29/2023    4:35 PM  KCCQ-12  1 a. Ability to shower/bathe Not at all limited Moderately limited  1 b. Ability to walk 1 block Not at all limited Moderately limited  1 c. Ability to hurry/jog Other, Did not do Extremely limited  2. Edema feet/ankles/legs Every morning Every morning  3. Limited by fatigue All of the time All of the time  4. Limited by dyspnea Never over the past 2 weeks At least once a day  5. Sitting up / on 3+ pillows Never over the past 2 weeks Never over the past 2 weeks  6. Limited enjoyment of life Not limited at all Slightly limited  7. Rest of life w/ symptoms Completely satisfied Somewhat satisfied  8 a. Participation in hobbies Did not limit at all Moderately limited  8 b. Participation in chores Did not limit at all Moderately limited  8 c. Visiting family/friends Did not limit at all Moderately limited    He has NYHA class II symptoms. Mostly due to weakness related to radiation therapy.   Echo has been r/s to 05/02/23 at [facility].   Cline Crock PA-C  [facility]

## 2023-04-23 ENCOUNTER — Ambulatory Visit: Payer: No Typology Code available for payment source

## 2023-04-23 ENCOUNTER — Other Ambulatory Visit: Payer: Self-pay

## 2023-04-23 ENCOUNTER — Ambulatory Visit
Admission: RE | Admit: 2023-04-23 | Discharge: 2023-04-23 | Disposition: A | Discharge status: Home / Self Care | Payer: No Typology Code available for payment source | Source: Ambulatory Visit | Attending: Radiation Oncology | Admitting: Radiation Oncology

## 2023-04-23 ENCOUNTER — Inpatient Hospital Stay: Payer: No Typology Code available for payment source

## 2023-04-23 DIAGNOSIS — Z510 Encounter for antineoplastic radiation therapy: Secondary | ICD-10-CM | POA: Diagnosis not present

## 2023-04-23 LAB — RAD ONC ARIA SESSION SUMMARY
Course Elapsed Days: 50
Plan Fractions Treated to Date: 25
Plan Prescribed Dose Per Fraction: 1.8 Gy
Plan Total Fractions Prescribed: 25
Plan Total Prescribed Dose: 45 Gy
Reference Point Dosage Given to Date: 45 Gy
Reference Point Session Dosage Given: 1.8 Gy
Session Number: 25

## 2023-04-24 ENCOUNTER — Ambulatory Visit
Admission: RE | Admit: 2023-04-24 | Discharge: 2023-04-24 | Discharge status: Home / Self Care | Payer: No Typology Code available for payment source | Source: Ambulatory Visit | Attending: Radiation Oncology

## 2023-04-24 ENCOUNTER — Ambulatory Visit: Payer: No Typology Code available for payment source

## 2023-04-24 ENCOUNTER — Other Ambulatory Visit: Payer: Self-pay

## 2023-04-24 ENCOUNTER — Ambulatory Visit (HOSPITAL_COMMUNITY): Payer: No Typology Code available for payment source

## 2023-04-24 DIAGNOSIS — Z510 Encounter for antineoplastic radiation therapy: Secondary | ICD-10-CM | POA: Diagnosis not present

## 2023-04-24 DIAGNOSIS — J9601 Acute respiratory failure with hypoxia: Secondary | ICD-10-CM | POA: Diagnosis not present

## 2023-04-24 DIAGNOSIS — R0602 Shortness of breath: Secondary | ICD-10-CM | POA: Diagnosis not present

## 2023-04-24 LAB — RAD ONC ARIA SESSION SUMMARY
Course Elapsed Days: 51
Plan Fractions Treated to Date: 1
Plan Prescribed Dose Per Fraction: 1.8 Gy
Plan Total Fractions Prescribed: 3
Plan Total Prescribed Dose: 5.4 Gy
Reference Point Dosage Given to Date: 46.8 Gy
Reference Point Session Dosage Given: 1.8 Gy
Session Number: 26

## 2023-04-25 ENCOUNTER — Ambulatory Visit
Admission: RE | Admit: 2023-04-25 | Discharge: 2023-04-25 | Disposition: A | Discharge status: Home / Self Care | Payer: No Typology Code available for payment source | Source: Ambulatory Visit | Attending: Radiation Oncology | Admitting: Radiation Oncology

## 2023-04-25 ENCOUNTER — Ambulatory Visit: Payer: No Typology Code available for payment source

## 2023-04-25 ENCOUNTER — Other Ambulatory Visit: Payer: Self-pay | Admitting: Radiation Oncology

## 2023-04-25 ENCOUNTER — Other Ambulatory Visit: Payer: Self-pay

## 2023-04-25 ENCOUNTER — Ambulatory Visit
Admission: RE | Admit: 2023-04-25 | Discharge: 2023-04-25 | Disposition: A | Discharge status: Home / Self Care | Payer: No Typology Code available for payment source | Source: Ambulatory Visit | Attending: Radiation Oncology

## 2023-04-25 DIAGNOSIS — Z510 Encounter for antineoplastic radiation therapy: Secondary | ICD-10-CM | POA: Diagnosis not present

## 2023-04-25 LAB — RAD ONC ARIA SESSION SUMMARY
Course Elapsed Days: 52
Plan Fractions Treated to Date: 2
Plan Prescribed Dose Per Fraction: 1.8 Gy
Plan Total Fractions Prescribed: 3
Plan Total Prescribed Dose: 5.4 Gy
Reference Point Dosage Given to Date: 48.6 Gy
Reference Point Session Dosage Given: 1.8 Gy
Session Number: 27

## 2023-04-25 MED ORDER — FLUCONAZOLE 100 MG PO TABS: 100.0000 mg | ORAL_TABLET | Freq: Every day | ORAL | 0 refills | Status: DC

## 2023-04-28 ENCOUNTER — Telehealth: Payer: No Typology Code available for payment source

## 2023-04-28 ENCOUNTER — Other Ambulatory Visit: Payer: Self-pay

## 2023-04-28 ENCOUNTER — Ambulatory Visit
Admission: RE | Admit: 2023-04-28 | Discharge: 2023-04-28 | Disposition: A | Discharge status: Home / Self Care | Payer: No Typology Code available for payment source | Source: Ambulatory Visit | Attending: Radiation Oncology | Admitting: Radiation Oncology

## 2023-04-28 ENCOUNTER — Inpatient Hospital Stay: Payer: No Typology Code available for payment source

## 2023-04-28 DIAGNOSIS — Z510 Encounter for antineoplastic radiation therapy: Secondary | ICD-10-CM | POA: Diagnosis not present

## 2023-04-28 LAB — RAD ONC ARIA SESSION SUMMARY
Course Elapsed Days: 55
Plan Fractions Treated to Date: 3
Plan Prescribed Dose Per Fraction: 1.8 Gy
Plan Total Fractions Prescribed: 3
Plan Total Prescribed Dose: 5.4 Gy
Reference Point Dosage Given to Date: 50.4 Gy
Reference Point Session Dosage Given: 1.8 Gy
Session Number: 28

## 2023-04-29 NOTE — Radiation Completion Notes (Addendum)
  Radiation Oncology         [phone] ________________________________  Name: Gary Carroll MRN: [id number]  Date of Service: 04/28/2023  DOB: 1945-10-28  End of Treatment Note    Diagnosis:  Stage IIB, cT4N0M0, adenocarcinoma of the distal rectum   Intent: Curative     ==========DELIVERED PLANS==========  First Treatment Date: 2023-03-04 Last Treatment Date: 2023-04-28   Plan Name: Rectum Site: Rectum Technique: IMRT Mode: Photon Dose Per Fraction: 1.8 Gy Prescribed Dose (Delivered / Prescribed): 45 Gy / 45 Gy Prescribed Fxs (Delivered / Prescribed): 25 / 25   Plan Name: Rectum_Bst Site: Rectum Technique: IMRT Mode: Photon Dose Per Fraction: 1.8 Gy Prescribed Dose (Delivered / Prescribed): 5.4 Gy / 5.4 Gy Prescribed Fxs (Delivered / Prescribed): 3 / 3     ==========ON TREATMENT VISIT DATES========== 2023-03-12, 2023-03-18, 2023-03-21, 2023-04-08, 2023-04-11, 2023-04-21, 2023-04-25   See weekly On Treatment Notes in Epic for details in the Media tab (listed as Progress notes on the On Treatment Visit Dates listed above). The patient tolerated radiation. He developed fatigue and anticipated skin changes in the treatment field. He had diarrhea and urinary urgency related to lasix. He also had mildly hypermetabolic activity on PET in the mediastinum as he was being worked up in September, however this was felt to be reactive though should be followed closely.  The patient will receive a call in about one month from the radiation oncology department. He will continue follow up with Dr. Corky Downs and Dr. Lavonia Dana at the [facility] as well as with Dr. Cliffton Asters in Colorectal Surgery. Of note he has been offered follow up but desired to delay this with Dr. Cliffton Asters.       Osker Mason, PAC

## 2023-05-02 ENCOUNTER — Ambulatory Visit (HOSPITAL_COMMUNITY)
Admission: RE | Admit: 2023-05-02 | Discharge: 2023-05-02 | Disposition: A | Discharge status: Home / Self Care | Payer: No Typology Code available for payment source | Source: Ambulatory Visit | Attending: Internal Medicine | Admitting: Internal Medicine

## 2023-05-02 DIAGNOSIS — Z952 Presence of prosthetic heart valve: Secondary | ICD-10-CM

## 2023-05-02 LAB — ECHOCARDIOGRAM COMPLETE
AR max vel: 1.4 cm2
AV Area VTI: 1.59 cm2
AV Area mean vel: 1.49 cm2
AV Mean grad: 11 mm[Hg]
AV Peak grad: 19.6 mm[Hg]
Ao pk vel: 2.22 m/s
Calc EF: 57.1 %
MV VTI: 2.59 cm2
S' Lateral: 2.7 cm
Single Plane A2C EF: 57.4 %
Single Plane A4C EF: 54.1 %

## 2023-05-02 MED ORDER — PERFLUTREN LIPID MICROSPHERE
1.0000 mL | INTRAVENOUS | Status: AC | PRN
  Administered 2023-05-02: 8 mL via INTRAVENOUS

## 2023-05-05 DIAGNOSIS — I110 Hypertensive heart disease with heart failure: Secondary | ICD-10-CM | POA: Diagnosis not present

## 2023-05-07 ENCOUNTER — Telehealth: Payer: No Typology Code available for payment source

## 2023-05-08 DIAGNOSIS — N390 Urinary tract infection, site not specified: Secondary | ICD-10-CM | POA: Diagnosis not present

## 2023-05-12 ENCOUNTER — Other Ambulatory Visit (HOSPITAL_COMMUNITY): Payer: No Typology Code available for payment source

## 2023-05-25 DIAGNOSIS — R0602 Shortness of breath: Secondary | ICD-10-CM | POA: Diagnosis not present

## 2023-05-25 DIAGNOSIS — J9601 Acute respiratory failure with hypoxia: Secondary | ICD-10-CM | POA: Diagnosis not present

## 2023-05-26 ENCOUNTER — Ambulatory Visit
Admission: RE | Admit: 2023-05-26 | Discharge: 2023-05-26 | Disposition: A | Discharge status: Home / Self Care | Payer: No Typology Code available for payment source | Source: Ambulatory Visit | Attending: Radiation Oncology | Admitting: Radiation Oncology

## 2023-05-26 DIAGNOSIS — C20 Malignant neoplasm of rectum: Secondary | ICD-10-CM | POA: Insufficient documentation

## 2023-05-26 DIAGNOSIS — Z510 Encounter for antineoplastic radiation therapy: Secondary | ICD-10-CM | POA: Insufficient documentation

## 2023-05-26 NOTE — Progress Notes (Signed)
  Radiation Oncology         [phone]) [phone] ________________________________  Name: Gary Carroll MRN: [id number]  Date of Service: 05/26/2023  DOB: February 24, 1946  Post Treatment Telephone Note  Diagnosis:  Stage IIB, cT4N0M0, adenocarcinoma of the distal rectum (as documented in provider EOT note)  The patient was available for call today.   Symptoms of fatigue have improved since completing therapy.  Symptoms of skin changes have improved since completing therapy.   Symptoms of bladder changes have improved since completing therapy.  Symptoms of bowel changes have improved since completing therapy but is still having some moderate diarrhea. Imodium helping.  The patient has scheduled follow up with Dr. Flynn and Dr. Rosellen at the [facility] as well as with Dr. Teresa in Colorectal Surgery.  he  was encouraged to call if he  develops concerns or questions regarding radiation.   This concludes the interaction.  Rosaline Minerva, LPN

## 2023-06-25 DIAGNOSIS — J9601 Acute respiratory failure with hypoxia: Secondary | ICD-10-CM | POA: Diagnosis not present

## 2023-06-25 DIAGNOSIS — R0602 Shortness of breath: Secondary | ICD-10-CM | POA: Diagnosis not present

## 2023-06-26 ENCOUNTER — Ambulatory Visit: Payer: No Typology Code available for payment source | Admitting: Cardiology

## 2023-07-23 DIAGNOSIS — J9601 Acute respiratory failure with hypoxia: Secondary | ICD-10-CM | POA: Diagnosis not present

## 2023-07-23 DIAGNOSIS — R0602 Shortness of breath: Secondary | ICD-10-CM | POA: Diagnosis not present

## 2023-07-31 DIAGNOSIS — M25531 Pain in right wrist: Secondary | ICD-10-CM | POA: Diagnosis not present

## 2023-07-31 DIAGNOSIS — M25532 Pain in left wrist: Secondary | ICD-10-CM | POA: Diagnosis not present

## 2023-08-21 ENCOUNTER — Encounter (HOSPITAL_COMMUNITY): Payer: Self-pay

## 2023-08-21 ENCOUNTER — Other Ambulatory Visit: Payer: Self-pay

## 2023-08-21 ENCOUNTER — Inpatient Hospital Stay (HOSPITAL_COMMUNITY)
Admission: EM | Admit: 2023-08-21 | Discharge: 2023-08-26 | DRG: 178 | Disposition: A | Discharge status: Skilled Nursing Facility | Attending: Internal Medicine | Admitting: Internal Medicine

## 2023-08-21 ENCOUNTER — Emergency Department (HOSPITAL_COMMUNITY)

## 2023-08-21 DIAGNOSIS — I350 Nonrheumatic aortic (valve) stenosis: Secondary | ICD-10-CM

## 2023-08-21 DIAGNOSIS — E66811 Obesity, class 1: Secondary | ICD-10-CM | POA: Diagnosis present

## 2023-08-21 DIAGNOSIS — E782 Mixed hyperlipidemia: Secondary | ICD-10-CM | POA: Diagnosis present

## 2023-08-21 DIAGNOSIS — I2510 Atherosclerotic heart disease of native coronary artery without angina pectoris: Secondary | ICD-10-CM | POA: Diagnosis present

## 2023-08-21 DIAGNOSIS — I10 Essential (primary) hypertension: Secondary | ICD-10-CM | POA: Diagnosis not present

## 2023-08-21 DIAGNOSIS — Z96652 Presence of left artificial knee joint: Secondary | ICD-10-CM | POA: Diagnosis present

## 2023-08-21 DIAGNOSIS — Z515 Encounter for palliative care: Secondary | ICD-10-CM | POA: Diagnosis not present

## 2023-08-21 DIAGNOSIS — Z7189 Other specified counseling: Secondary | ICD-10-CM | POA: Diagnosis not present

## 2023-08-21 DIAGNOSIS — Z87891 Personal history of nicotine dependence: Secondary | ICD-10-CM

## 2023-08-21 DIAGNOSIS — C189 Malignant neoplasm of colon, unspecified: Secondary | ICD-10-CM | POA: Diagnosis present

## 2023-08-21 DIAGNOSIS — E871 Hypo-osmolality and hyponatremia: Secondary | ICD-10-CM | POA: Diagnosis present

## 2023-08-21 DIAGNOSIS — I110 Hypertensive heart disease with heart failure: Secondary | ICD-10-CM | POA: Diagnosis present

## 2023-08-21 DIAGNOSIS — Z8249 Family history of ischemic heart disease and other diseases of the circulatory system: Secondary | ICD-10-CM

## 2023-08-21 DIAGNOSIS — Z888 Allergy status to other drugs, medicaments and biological substances status: Secondary | ICD-10-CM

## 2023-08-21 DIAGNOSIS — G4730 Sleep apnea, unspecified: Secondary | ICD-10-CM | POA: Diagnosis present

## 2023-08-21 DIAGNOSIS — Z6834 Body mass index (BMI) 34.0-34.9, adult: Secondary | ICD-10-CM

## 2023-08-21 DIAGNOSIS — J45909 Unspecified asthma, uncomplicated: Secondary | ICD-10-CM | POA: Diagnosis present

## 2023-08-21 DIAGNOSIS — R531 Weakness: Secondary | ICD-10-CM

## 2023-08-21 DIAGNOSIS — I4892 Unspecified atrial flutter: Secondary | ICD-10-CM | POA: Diagnosis present

## 2023-08-21 DIAGNOSIS — Z7984 Long term (current) use of oral hypoglycemic drugs: Secondary | ICD-10-CM

## 2023-08-21 DIAGNOSIS — Z96651 Presence of right artificial knee joint: Secondary | ICD-10-CM | POA: Diagnosis present

## 2023-08-21 DIAGNOSIS — I5030 Unspecified diastolic (congestive) heart failure: Secondary | ICD-10-CM | POA: Diagnosis present

## 2023-08-21 DIAGNOSIS — E785 Hyperlipidemia, unspecified: Secondary | ICD-10-CM | POA: Diagnosis present

## 2023-08-21 DIAGNOSIS — U071 COVID-19: Principal | ICD-10-CM | POA: Diagnosis present

## 2023-08-21 DIAGNOSIS — Z952 Presence of prosthetic heart valve: Secondary | ICD-10-CM

## 2023-08-21 DIAGNOSIS — E119 Type 2 diabetes mellitus without complications: Secondary | ICD-10-CM | POA: Diagnosis present

## 2023-08-21 DIAGNOSIS — Z751 Person awaiting admission to adequate facility elsewhere: Secondary | ICD-10-CM

## 2023-08-21 DIAGNOSIS — Z7901 Long term (current) use of anticoagulants: Secondary | ICD-10-CM

## 2023-08-21 DIAGNOSIS — G4733 Obstructive sleep apnea (adult) (pediatric): Secondary | ICD-10-CM | POA: Diagnosis present

## 2023-08-21 DIAGNOSIS — K219 Gastro-esophageal reflux disease without esophagitis: Secondary | ICD-10-CM | POA: Diagnosis present

## 2023-08-21 DIAGNOSIS — Z79899 Other long term (current) drug therapy: Secondary | ICD-10-CM

## 2023-08-21 DIAGNOSIS — I5032 Chronic diastolic (congestive) heart failure: Secondary | ICD-10-CM | POA: Diagnosis present

## 2023-08-21 DIAGNOSIS — I480 Paroxysmal atrial fibrillation: Secondary | ICD-10-CM | POA: Diagnosis present

## 2023-08-21 DIAGNOSIS — Z743 Need for continuous supervision: Secondary | ICD-10-CM | POA: Diagnosis not present

## 2023-08-21 DIAGNOSIS — C20 Malignant neoplasm of rectum: Secondary | ICD-10-CM | POA: Diagnosis present

## 2023-08-21 LAB — MAGNESIUM: Magnesium: 1.9 mg/dL (ref 1.7–2.4)

## 2023-08-21 LAB — COMPREHENSIVE METABOLIC PANEL WITH GFR
ALT: 34 U/L (ref 0–44)
AST: 35 U/L (ref 15–41)
Albumin: 2.9 g/dL — ABNORMAL LOW (ref 3.5–5.0)
Alkaline Phosphatase: 61 U/L (ref 38–126)
Anion gap: 9 (ref 5–15)
BUN: 15 mg/dL (ref 8–23)
CO2: 24 mmol/L (ref 22–32)
Calcium: 9.4 mg/dL (ref 8.9–10.3)
Chloride: 102 mmol/L (ref 98–111)
Creatinine, Ser: 0.74 mg/dL (ref 0.61–1.24)
GFR, Estimated: >60 mL/min (ref >60)
Glucose, Bld: 122 mg/dL — ABNORMAL HIGH (ref 70–99)
Potassium: 4.2 mmol/L (ref 3.5–5.1)
Sodium: 135 mmol/L (ref 135–145)
Total Bilirubin: 0.4 mg/dL (ref 0.0–1.2)
Total Protein: 6.7 g/dL (ref 6.5–8.1)

## 2023-08-21 LAB — URINALYSIS, ROUTINE W REFLEX MICROSCOPIC
Bilirubin Urine: NEGATIVE
Glucose, UA: NEGATIVE mg/dL
Hgb urine dipstick: NEGATIVE
Ketones, ur: NEGATIVE mg/dL
Nitrite: POSITIVE — AB
Protein, ur: NEGATIVE mg/dL
Specific Gravity, Urine: 1.014 (ref 1.005–1.030)
WBC, UA: >50 WBC/hpf (ref 0–5)
pH: 6 (ref 5.0–8.0)

## 2023-08-21 LAB — RESP PANEL BY RT-PCR (RSV, FLU A&B, COVID)  RVPGX2
Influenza A by PCR: NEGATIVE
Influenza B by PCR: NEGATIVE
Resp Syncytial Virus by PCR: NEGATIVE
SARS Coronavirus 2 by RT PCR: POSITIVE — AB

## 2023-08-21 LAB — CBC WITH DIFFERENTIAL/PLATELET
Abs Immature Granulocytes: 0.02 10*3/uL (ref 0.00–0.07)
Basophils Absolute: 0 10*3/uL (ref 0.0–0.1)
Basophils Relative: 1 %
Eosinophils Absolute: 0 10*3/uL (ref 0.0–0.5)
Eosinophils Relative: 0 %
HCT: 38.5 % — ABNORMAL LOW (ref 39.0–52.0)
Hemoglobin: 12.4 g/dL — ABNORMAL LOW (ref 13.0–17.0)
Immature Granulocytes: 0 %
Lymphocytes Relative: 13 %
Lymphs Abs: 0.7 10*3/uL (ref 0.7–4.0)
MCH: 29 pg (ref 26.0–34.0)
MCHC: 32.2 g/dL (ref 30.0–36.0)
MCV: 90.2 fL (ref 80.0–100.0)
Monocytes Absolute: 0.6 10*3/uL (ref 0.1–1.0)
Monocytes Relative: 11 %
Neutro Abs: 4 10*3/uL (ref 1.7–7.7)
Neutrophils Relative %: 75 %
Platelets: 126 10*3/uL — ABNORMAL LOW (ref 150–400)
RBC: 4.27 MIL/uL (ref 4.22–5.81)
RDW: 16.7 % — ABNORMAL HIGH (ref 11.5–15.5)
WBC: 5.3 10*3/uL (ref 4.0–10.5)
nRBC: 0 % (ref 0.0–0.2)

## 2023-08-21 LAB — TSH: TSH: 1.656 u[IU]/mL (ref 0.350–4.500)

## 2023-08-21 LAB — TROPONIN I (HIGH SENSITIVITY)
Troponin I (High Sensitivity): 7 ng/L (ref <18)
Troponin I (High Sensitivity): 8 ng/L (ref <18)

## 2023-08-21 LAB — LIPASE, BLOOD: Lipase: 26 U/L (ref 11–51)

## 2023-08-21 MED ORDER — ONDANSETRON HCL 4 MG/2ML IJ SOLN: 4.0000 mg | Freq: Four times a day (QID) | INTRAMUSCULAR | Status: DC | PRN

## 2023-08-21 MED ORDER — METOPROLOL TARTRATE 50 MG PO TABS
50.0000 mg | ORAL_TABLET | Freq: Two times a day (BID) | ORAL | Status: DC
  Administered 2023-08-21 – 2023-08-25 (×10): 50 mg via ORAL
  Filled 2023-08-21 (×11): qty 1

## 2023-08-21 MED ORDER — ACETAMINOPHEN 325 MG PO TABS
650.0000 mg | ORAL_TABLET | Freq: Four times a day (QID) | ORAL | Status: DC | PRN
  Administered 2023-08-21: 650 mg via ORAL
  Filled 2023-08-21: qty 2

## 2023-08-21 MED ORDER — ACETAMINOPHEN 650 MG RE SUPP: 650.0000 mg | Freq: Four times a day (QID) | RECTAL | Status: DC | PRN

## 2023-08-21 MED ORDER — APIXABAN 5 MG PO TABS
5.0000 mg | ORAL_TABLET | Freq: Two times a day (BID) | ORAL | Status: DC
  Administered 2023-08-21 – 2023-08-25 (×10): 5 mg via ORAL
  Filled 2023-08-21 (×11): qty 1

## 2023-08-21 MED ORDER — LOSARTAN POTASSIUM 50 MG PO TABS
50.0000 mg | ORAL_TABLET | Freq: Every day | ORAL | Status: DC
  Administered 2023-08-21 – 2023-08-25 (×5): 50 mg via ORAL
  Filled 2023-08-21 (×6): qty 1

## 2023-08-21 MED ORDER — INSULIN ASPART 100 UNIT/ML IJ SOLN
0.0000 [IU] | Freq: Three times a day (TID) | INTRAMUSCULAR | Status: DC
  Administered 2023-08-21: 1 [IU] via SUBCUTANEOUS

## 2023-08-21 MED ORDER — SODIUM CHLORIDE 0.9 % IV BOLUS
500.0000 mL | Freq: Once | INTRAVENOUS | Status: AC
  Administered 2023-08-21: 500 mL via INTRAVENOUS

## 2023-08-21 MED ORDER — DUTASTERIDE 0.5 MG PO CAPS
0.5000 mg | ORAL_CAPSULE | Freq: Every day | ORAL | Status: DC
  Administered 2023-08-22 – 2023-08-25 (×4): 0.5 mg via ORAL
  Filled 2023-08-21 (×6): qty 1

## 2023-08-21 MED ORDER — PANTOPRAZOLE SODIUM 40 MG PO TBEC
40.0000 mg | DELAYED_RELEASE_TABLET | Freq: Every day | ORAL | Status: DC
  Administered 2023-08-22 – 2023-08-26 (×5): 40 mg via ORAL
  Filled 2023-08-21 (×5): qty 1

## 2023-08-21 MED ORDER — DILTIAZEM HCL ER COATED BEADS 120 MG PO CP24
120.0000 mg | ORAL_CAPSULE | Freq: Every day | ORAL | Status: DC
  Administered 2023-08-21 – 2023-08-25 (×5): 120 mg via ORAL
  Filled 2023-08-21 (×6): qty 1

## 2023-08-21 MED ORDER — ONDANSETRON HCL 4 MG PO TABS: 4.0000 mg | ORAL_TABLET | Freq: Four times a day (QID) | ORAL | Status: DC | PRN

## 2023-08-21 MED ORDER — ORAL CARE MOUTH RINSE: 15.0000 mL | OROMUCOSAL | Status: DC | PRN

## 2023-08-21 MED ORDER — OXYBUTYNIN CHLORIDE 5 MG PO TABS
5.0000 mg | ORAL_TABLET | Freq: Every evening | ORAL | Status: DC
  Administered 2023-08-21 – 2023-08-25 (×5): 5 mg via ORAL
  Filled 2023-08-21 (×5): qty 1

## 2023-08-21 MED ORDER — ROSUVASTATIN CALCIUM 20 MG PO TABS
20.0000 mg | ORAL_TABLET | Freq: Every day | ORAL | Status: DC
  Administered 2023-08-21 – 2023-08-25 (×5): 20 mg via ORAL
  Filled 2023-08-21 (×5): qty 1

## 2023-08-21 MED ORDER — METFORMIN HCL 500 MG PO TABS
500.0000 mg | ORAL_TABLET | Freq: Two times a day (BID) | ORAL | Status: DC
  Administered 2023-08-22 – 2023-08-26 (×9): 500 mg via ORAL
  Filled 2023-08-21 (×9): qty 1

## 2023-08-21 NOTE — ED Notes (Signed)
 Patient soiled brief, full linen changed. Patient unable to void at this time.

## 2023-08-21 NOTE — ED Notes (Signed)
 Patient incontinent x 2. Cleaned and linen changed.

## 2023-08-21 NOTE — ED Triage Notes (Addendum)
 Pt BIB [facility] from home alone with progressive worsening weakness x 3 weeks. Unable to ambulate. Endorses recent colon CA diagnosis with radiation, last treatment December. Unable to tolerate PO intake. Pt self-caths at baseline.

## 2023-08-21 NOTE — H&P (Signed)
 History and Physical    Gary Carroll ZOX:[id number] DOB: 29-May-1945 DOA: 08/21/2023  PCP: Benita Stabile, MD   Patient coming from: Home  Chief Complaint: Generalized weakness with poor p.o. intake  HPI: Gary Carroll is a 78 y.o. male with medical history significant for severe AS status post TAVR, PAF on Eliquis, CAD, rectal adenocarcinoma, hypertension, dyslipidemia, type 2 diabetes, and chronic HFpEF who presented to the ED with significant weakness over the last several days and this has been progressively worsening over the last 1-2 weeks.  He lives by himself and at baseline has difficulty ambulating and uses a walker, but states now that he is not able to get out of bed and has poor appetite.  He states he was having low-grade fevers at home as well but denies any cough or shortness of breath or chest pain.  He has otherwise been taking his home medications and followed up with his oncologist at the [place] last week.  He states that he has been too weak to tolerate chemotherapy and is no longer candidate for any surgical measures given his cancer.   ED Course: Vital signs stable and patient is afebrile.  Hemoglobin 12.4 and platelets of 126.  Chest x-ray with no acute findings and patient is on room air.  COVID testing is positive.  He was given 500 mL fluid bolus.  Review of Systems: Reviewed as noted above, otherwise negative.  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 Excell Seltzer and Dr. Laneta Simmers   Severe aortic stenosis    Sleep apnea    Urinary frequency    Urinary urgency    Wears glasses     Past Surgical History:  Procedure Laterality Date   ABDOMINAL AORTOGRAM W/LOWER EXTREMITY N/A 02/05/2023   Procedure: ABDOMINAL AORTOGRAM W/LOWER EXTREMITY;  Surgeon: [name], MD;  Location: [facility] INVASIVE CV LAB;  Service: Cardiovascular;  Laterality: N/A;   CATARACT EXTRACTION W/PHACO Left 12/07/2021   Procedure: CATARACT EXTRACTION PHACO AND INTRAOCULAR LENS PLACEMENT (IOC);  Surgeon: Fabio Pierce, MD;  Location: [facility] ORS;  Service: Ophthalmology;  Laterality: Left;  CDE 7.75   CATARACT EXTRACTION W/PHACO Right 12/21/2021   Procedure: CATARACT EXTRACTION PHACO AND INTRAOCULAR LENS PLACEMENT (IOC);  Surgeon: Fabio Pierce, MD;  Location: [facility] ORS;  Service: Ophthalmology;  Laterality: Right;  CDE: 8.00   CHOLECYSTECTOMY     COLONOSCOPY  02/2012   Dr. Arlyce Dice: sigmoid, transverse, and ascending colon diverticulosis   COLONOSCOPY WITH PROPOFOL N/A 01/13/2023   Procedure: COLONOSCOPY WITH PROPOFOL;  Surgeon: Lanelle Bal, DO;  Location: [facility] ENDO SUITE;  Service: Endoscopy;  Laterality: N/A;  1:00 pm, asa 3   FINGER SURGERY Right    INTRAOPERATIVE TRANSTHORACIC ECHOCARDIOGRAM N/A 02/25/2023   Procedure: INTRAOPERATIVE TRANSTHORACIC  ECHOCARDIOGRAM;  Surgeon: Tonny Bollman, MD;  Location: [facility] INVASIVE CV LAB;  Service: Open Heart Surgery;  Laterality: N/A;   prostate procedure  2017   per patient, was having trouble urinating    RIGHT HEART CATH AND CORONARY ANGIOGRAPHY N/A 02/05/2023   Procedure: RIGHT HEART CATH AND CORONARY ANGIOGRAPHY;  Surgeon: [name], MD;  Location: [facility] INVAS[facility] CV LAB;  Service: Cardiovascular;  Laterality: N/A;   TONSILLECTOMY     TOTAL KNEE ARTHROPLASTY  2012   left (Dr. 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/19/2015   Procedure: TOTAL KNEE  ARTHROPLASTY;  Surgeon: Dannielle Huh, MD;  Location: [facility] OR;  Service: Orthopedics;  Laterality: Right;   TRANSCATHETER AORTIC VALVE REPLACEMENT, TRANSFEMORAL N/A 02/25/2023   Procedure: Transcatheter Aortic Valve Replacement, Transfemoral;  Surgeon: Tonny Bollman, MD;  Location: [facility] INVASIVE CV LAB;  Service: Open Heart Surgery;  Laterality: N/A;     reports that he quit smoking about 13 years ago. His smoking use included cigarettes. He has never used smokeless tobacco. He reports that he does not currently use alcohol after a past usage of about 1.0 - 2.0 standard drink of alcohol per week. He reports that he does not use drugs.  Allergies  Allergen Reactions   Diovan [Valsartan] Cough   Tagamet [Cimetidine] Other (See Comments)    irrittability    Family History  Problem Relation Age of Onset   Uterine cancer Mother    Cancer Mother        uterine   Coronary artery disease Father    Coronary artery disease Paternal Grandfather    Heart disease Paternal Grandfather    Colon cancer Neg Hx    Esophageal cancer Neg Hx    Stomach cancer Neg Hx    Rectal cancer Neg Hx    Liver disease Neg Hx     Prior to Admission medications   Medication Sig Start Date End Date Taking? Authorizing Provider  acetaminophen (TYLENOL) 650 MG CR tablet Take 1,300 mg by mouth every 8 (eight) hours as needed for pain.    [provider]  amoxicillin (AMOXIL) 500 MG tablet Take 2,000 mg by mouth as directed. take 4 tablets by mouth 1 hour prior to dental procedures and cleanings.    [provider]  apixaban (ELIQUIS) 5 MG TABS tablet Take 1 tablet (5 mg total) by mouth 2 (two) times daily. 08/24/15   Philip Aspen, Limmie Patricia, MD  cephALEXin (KEFLEX) 500 MG capsule Take 1 capsule (500 mg total) by mouth 3 (three) times daily. 04/05/23   Geoffery Lyons, MD  diltiazem (CARDIZEM CD) 120 MG 24 hr capsule Take 1 capsule (120 mg total) by mouth daily. 01/18/23   Dione Booze, MD  dutasteride  (AVODART) 0.5 MG capsule Take 0.5 mg by mouth daily.    [provider]  fluconazole (DIFLUCAN) 100 MG tablet Take 1 tablet (100 mg total) by mouth daily. Take 2 tabs x 1, then 1 tab QD x 9 additional days. 04/25/23   Dorothy Puffer, MD  furosemide (LASIX) 40 MG tablet Take 1 tablet (40 mg total) by mouth 2 (two) times daily. You can take an additional 40mg  of Lasix as needed for shortness of breath or swelling Patient taking differently: Take 40 mg by mouth daily. 02/06/23   Janetta Hora, PA-C  losartan (COZAAR) 50 MG tablet Take 0.5 tablets (25 mg total) by mouth daily. Patient taking differently: Take 50 mg by mouth daily. 12/23/22   Vassie Loll,  MD  Magnesium 500 MG CAPS Take 500 mg by mouth daily.    [provider]  Melatonin 10 MG CAPS Take 10 mg by mouth at bedtime.    [provider]  metFORMIN (GLUCOPHAGE) 500 MG tablet Take 500 mg by mouth 2 (two) times daily with a meal.    [provider]  metoprolol tartrate (LOPRESSOR) 50 MG tablet Take 1 tablet (50 mg total) by mouth 2 (two) times daily. 01/18/23   Dione Booze, MD  Multiple Vitamins-Minerals (MULTIVITAL) tablet Take 1 tablet by mouth daily.    [provider]  Omega-3 Fatty Acids (FISH OIL) 1000 MG CAPS Take 2,000 mg by mouth daily.    [provider]  oxybutynin (DITROPAN) 5 MG tablet Take 5 mg by mouth every evening. 06/01/19   [provider]  pantoprazole (PROTONIX) 40 MG tablet Take 40 mg by mouth daily.    [provider]  phenazopyridine (PYRIDIUM) 200 MG tablet Take 1 tablet (200 mg total) by mouth 3 (three) times daily. 03/14/23   Jacalyn Lefevre, MD  potassium chloride (KLOR-CON) 10 MEQ tablet Take 1 tablet (10 mEq total) by mouth daily. 02/06/23   Janetta Hora, PA-C  rosuvastatin (CRESTOR) 20 MG tablet Take 20 mg by mouth daily.    [provider]  vitamin C (ASCORBIC ACID) 500 MG tablet Take 500 mg by mouth daily.    [provider]    Physical Exam: Vitals:   08/21/23 1015 08/21/23 1030 08/21/23 1106 08/21/23 1223  BP: (!) 148/88 (!) 146/85 (!) 144/90 (!) 153/100  Pulse: (!) 102 (!) 102 100 95  Resp: (!) 24 (!) 29 20   Temp:   97.6 F (36.4 C)   TempSrc:   Oral   SpO2: 96% 95% 96%   Weight:      Height:        Constitutional: NAD, calm, comfortable, obese Vitals:   08/21/23 1015 08/21/23 1030 08/21/23 1106 08/21/23 1223  BP: (!) 148/88 (!) 146/85 (!) 144/90 (!) 153/100  Pulse: (!) 102 (!) 102 100 95  Resp: (!) 24 (!) 29 20   Temp:   97.6 F (36.4 C)   TempSrc:   Oral   SpO2: 96% 95% 96%   Weight:      Height:       Eyes: lids and conjunctivae normal Neck: normal, supple Respiratory: clear to auscultation bilaterally. Normal respiratory effort. No accessory muscle use.  Cardiovascular: Regular rate and rhythm, no murmurs. Abdomen: no tenderness, no distention. Bowel sounds positive.  Musculoskeletal: Chronic venous stasis ulcers with scant edema bilaterally Psychiatric: Flat affect  Labs on Admission: I have personally reviewed following labs and imaging studies  CBC: Recent Labs  Lab 08/21/23 0710  WBC 5.3  NEUTROABS 4.0  HGB 12.4*  HCT 38.5*  MCV 90.2  PLT 126*   Basic Metabolic Panel: Recent Labs  Lab 08/21/23 0710  NA 135  K 4.2  CL 102  CO2 24  GLUCOSE 122*  BUN 15  CREATININE 0.74  CALCIUM 9.4  MG 1.9   GFR: Estimated Creatinine Clearance: 96.4 mL/min (by C-G formula based on SCr of 0.74 mg/dL). Liver Function Tests: Recent Labs  Lab 08/21/23 0710  AST 35  ALT 34  ALKPHOS 61  BILITOT 0.4  PROT 6.7  ALBUMIN 2.9*   Recent Labs  Lab 08/21/23 0710  LIPASE 26   No results for input(s): "AMMONIA" in the last 168 hours. Coagulation Profile: No results for input(s): "  INR", "PROTIME" in the last 168 hours. Cardiac Enzymes: No results for input(s): "CKTOTAL", "CKMB", "CKMBINDEX", "TROPONINI" in the last 168 hours. BNP (last 3 results) No results  for input(s): "PROBNP" in the last 8760 hours. HbA1C: No results for input(s): "HGBA1C" in the last 72 hours. CBG: No results for input(s): "GLUCAP" in the last 168 hours. Lipid Profile: No results for input(s): "CHOL", "HDL", "LDLCALC", "TRIG", "CHOLHDL", "LDLDIRECT" in the last 72 hours. Thyroid Function Tests: No results for input(s): "TSH", "T4TOTAL", "FREET4", "T3FREE", "THYROIDAB" in the last 72 hours. Anemia Panel: No results for input(s): "VITAMINB12", "FOLATE", "FERRITIN", "TIBC", "IRON", "RETICCTPCT" in the last 72 hours. Urine analysis:    Component Value Date/Time   COLORURINE AMBER (A) 08/21/2023 1045   APPEARANCEUR CLOUDY (A) 08/21/2023 1045   LABSPEC 1.014 08/21/2023 1045   PHURINE 6.0 08/21/2023 1045   GLUCOSEU NEGATIVE 08/21/2023 1045   HGBUR NEGATIVE 08/21/2023 1045   BILIRUBINUR NEGATIVE 08/21/2023 1045   BILIRUBINUR neg 08/31/2012 1203   KETONESUR NEGATIVE 08/21/2023 1045   PROTEINUR NEGATIVE 08/21/2023 1045   UROBILINOGEN negative 08/31/2012 1203   UROBILINOGEN 0.2 05/31/2010 1125   NITRITE POSITIVE (A) 08/21/2023 1045   LEUKOCYTESUR LARGE (A) 08/21/2023 1045    Radiological Exams on Admission: DG Chest Port 1 View Result Date: 08/21/2023 CLINICAL DATA:  Weakness. EXAM: PORTABLE CHEST 1 VIEW COMPARISON:  03/14/2023 FINDINGS: Low lung volumes. Cardiopericardial silhouette is at upper limits of normal for size. Interstitial markings are diffusely coarsened with chronic features. The lungs are clear without focal pneumonia, edema, pneumothorax or pleural effusion. Hazy opacity overlying the left base is probably superimposition of soft tissues. No acute bony abnormality. Telemetry leads overlie the chest. IMPRESSION: Low volume film without acute cardiopulmonary findings. Electronically Signed   By: Kennith Center M.D.   On: 08/21/2023 07:53    EKG: Independently reviewed.  Atrial flutter 104 bpm.  Assessment/Plan Principal Problem:   COVID-19 Active  Problems:   Essential (primary) hypertension   Mixed hyperlipidemia   Gastroesophageal reflux disease without esophagitis   AF (paroxysmal atrial fibrillation) (HCC)   Chronic anticoagulation   Aortic stenosis   Obstructive sleep apnea (adult) (pediatric)   (HFpEF) heart failure with preserved ejection fraction (HCC)   Primary adenocarcinoma of colon (HCC)   Morbid obesity (HCC)    Symptomatic COVID-19 infection with possible failure to thrive -No current need for treatment with no findings on chest x-ray or any significant hypoxemia -Given significant weakness, will ask PT to assess -Assess dietary intake  PAF/flutter -Currently stable on telemetry -Continue home diltiazem and metoprolol -Eliquis for anticoagulation  OSA -On CPAP  Hypertension -Continue home medications  Dyslipidemia -Continue statin  Chronic HFpEF -Currently appears euvolemic and given IV fluid bolus in ED -Plan to resume Lasix if tolerating diet  Type 2 diabetes -Continue metformin -Carb diet -SSI   Rectal adenocarcinoma -Follows with oncologist at [facility] and currently not on any further chemotherapy or radiation -Apparently not a candidate for surgery -Overall prognosis appears poor -Appreciate palliative consultation for goals of care discussion  History of severe AS status post TAVR -Successful TAVR with 26 mm Edwards SAPIEN 3 THV on 02/25/2023  Obesity, class I -BMI 34.17   DVT prophylaxis: Eliquis Code Status: Full Family Communication: None at bedside Disposition Plan: Admit for PT evaluation Consults called: Palliative Admission status: Observation, MedSurg  Severity of Illness: The appropriate patient status for this patient is OBSERVATION. Observation status is judged to be reasonable and necessary in order to provide the required intensity  of service to ensure the patient's safety. The patient's presenting symptoms, physical exam findings, and initial radiographic and laboratory  data in the context of their medical condition is felt to place them at decreased risk for further clinical deterioration. Furthermore, it is anticipated that the patient will be medically stable for discharge from the hospital within 2 midnights of admission.    Toniann Dickerson D Sherryll Burger DO [facility]  If 7PM-7AM, please contact night-coverage [email or web address]  08/21/2023, 12:39 PM

## 2023-08-21 NOTE — ED Provider Notes (Signed)
 [place] EMERGENCY DEPARTMENT AT [facility] Provider Note   CSN: [id number] Arrival date & time: 08/21/23  6644     History  Chief Complaint  Patient presents with   Weakness    Gary Carroll is a 78 y.o. male.  He has a history of atrial fibrillation on blood thinners, rectal cancer.  He tells me he finished radiation in December and that was about the last time of his chemotherapy also.  Follows with oncology at the [facility] and saw them last week.  He has been too weak to tolerate chemotherapy.  Not a surgical candidate.  Lives at home alone.  Says he has baseline difficulty walking and uses a walker but for the last few days has not been able to get out of bed.  Poor appetite.  Eating door Dash and microwave meals.  Is having low-grade fevers at home.  No cough shortness of breath chest pain abdominal pain.  Has chronic diarrhea and has difficulty getting to the bathroom  The history is provided by the patient.  Weakness Severity:  Severe Onset quality:  Gradual Duration:  4 days Timing:  Constant Progression:  Worsening Chronicity:  New Relieved by:  Nothing Worsened by:  Activity Ineffective treatments:  Rest Associated symptoms: diarrhea, difficulty walking and fever   Associated symptoms: no abdominal pain, no chest pain, no cough, no headaches, no nausea, no shortness of breath and no vomiting        Home Medications Prior to Admission medications   Medication Sig Start Date End Date Taking? Authorizing Provider  acetaminophen (TYLENOL) 650 MG CR tablet Take 1,300 mg by mouth every 8 (eight) hours as needed for pain.    [provider]  amoxicillin (AMOXIL) 500 MG tablet Take 2,000 mg by mouth as directed. take 4 tablets by mouth 1 hour prior to dental procedures and cleanings.    [provider]  apixaban (ELIQUIS) 5 MG TABS tablet Take 1 tablet (5 mg total) by mouth 2 (two) times daily. 08/24/15   Philip Aspen, Limmie Patricia, MD  cephALEXin  (KEFLEX) 500 MG capsule Take 1 capsule (500 mg total) by mouth 3 (three) times daily. 04/05/23   Geoffery Lyons, MD  diltiazem (CARDIZEM CD) 120 MG 24 hr capsule Take 1 capsule (120 mg total) by mouth daily. 01/18/23   Dione Booze, MD  dutasteride (AVODART) 0.5 MG capsule Take 0.5 mg by mouth daily.    [provider]  fluconazole (DIFLUCAN) 100 MG tablet Take 1 tablet (100 mg total) by mouth daily. Take 2 tabs x 1, then 1 tab QD x 9 additional days. 04/25/23   Dorothy Puffer, MD  furosemide (LASIX) 40 MG tablet Take 1 tablet (40 mg total) by mouth 2 (two) times daily. You can take an additional 40mg  of Lasix as needed for shortness of breath or swelling Patient taking differently: Take 40 mg by mouth daily. 02/06/23   Janetta Hora, PA-C  losartan (COZAAR) 50 MG tablet Take 0.5 tablets (25 mg total) by mouth daily. Patient taking differently: Take 50 mg by mouth daily. 12/23/22   Vassie Loll, MD  Magnesium 500 MG CAPS Take 500 mg by mouth daily.    [provider]  Melatonin 10 MG CAPS Take 10 mg by mouth at bedtime.    [provider]  metFORMIN (GLUCOPHAGE) 500 MG tablet Take 500 mg by mouth 2 (two) times daily with a meal.    [provider]  metoprolol tartrate (LOPRESSOR)  50 MG tablet Take 1 tablet (50 mg total) by mouth 2 (two) times daily. 01/18/23   Dione Booze, MD  Multiple Vitamins-Minerals (MULTIVITAL) tablet Take 1 tablet by mouth daily.    [provider]  Omega-3 Fatty Acids (FISH OIL) 1000 MG CAPS Take 2,000 mg by mouth daily.    [provider]  oxybutynin (DITROPAN) 5 MG tablet Take 5 mg by mouth every evening. 06/01/19   [provider]  pantoprazole (PROTONIX) 40 MG tablet Take 40 mg by mouth daily.    [provider]  phenazopyridine (PYRIDIUM) 200 MG tablet Take 1 tablet (200 mg total) by mouth 3 (three) times daily. 03/14/23   Jacalyn Lefevre, MD  potassium chloride (KLOR-CON) 10 MEQ tablet Take 1 tablet  (10 mEq total) by mouth daily. 02/06/23   Janetta Hora, PA-C  rosuvastatin (CRESTOR) 20 MG tablet Take 20 mg by mouth daily.    [provider]  vitamin C (ASCORBIC ACID) 500 MG tablet Take 500 mg by mouth daily.    [provider]      Allergies    Diovan [valsartan] and Tagamet [cimetidine]    Review of Systems   Review of Systems  Constitutional:  Positive for fever.  Respiratory:  Negative for cough and shortness of breath.   Cardiovascular:  Negative for chest pain.  Gastrointestinal:  Positive for diarrhea. Negative for abdominal pain, nausea and vomiting.  Neurological:  Positive for weakness. Negative for headaches.    Physical Exam Updated Vital Signs BP (!) 151/88 (BP Location: Right Arm)   Pulse (!) 104   Temp 97.9 F (36.6 C) (Oral)   Resp (!) 25   Ht 5\' 11"  (1.803 m)   Wt 111.1 kg   SpO2 95%   BMI 34.17 kg/m  Physical Exam Vitals and nursing note reviewed.  Constitutional:      General: He is not in acute distress.    Appearance: Normal appearance. He is well-developed. He is obese.  HENT:     Head: Normocephalic and atraumatic.  Eyes:     Conjunctiva/sclera: Conjunctivae normal.  Cardiovascular:     Rate and Rhythm: Regular rhythm. Tachycardia present.     Heart sounds: No murmur heard. Pulmonary:     Effort: Pulmonary effort is normal. No respiratory distress.     Breath sounds: Normal breath sounds.  Abdominal:     Palpations: Abdomen is soft.     Tenderness: There is no abdominal tenderness.  Musculoskeletal:        General: No deformity.     Cervical back: Neck supple.     Right lower leg: Edema present.     Left lower leg: Edema present.  Skin:    General: Skin is warm and dry.     Capillary Refill: Capillary refill takes less than 2 seconds.  Neurological:     General: No focal deficit present.     Mental Status: He is alert.     Motor: Weakness present.     Comments: He is globally weak legs greater than arms      ED Results / Procedures / Treatments   Labs (all labs ordered are listed, but only abnormal results are displayed) Labs Reviewed  RESP PANEL BY RT-PCR (RSV, FLU A&B, COVID)  [id number] - Abnormal; Notable for the following components:      Result Value   SARS Coronavirus 2 by RT PCR POSITIVE (*)    All other components within normal limits  COMPREHENSIVE METABOLIC  PANEL WITH GFR - Abnormal; Notable for the following components:   Glucose, Bld 122 (*)    Albumin 2.9 (*)    All other components within normal limits  CBC WITH DIFFERENTIAL/PLATELET - Abnormal; Notable for the following components:   Hemoglobin 12.4 (*)    HCT 38.5 (*)    RDW 16.7 (*)    Platelets 126 (*)    All other components within normal limits  URINALYSIS, ROUTINE W REFLEX MICROSCOPIC - Abnormal; Notable for the following components:   Color, Urine AMBER (*)    APPearance CLOUDY (*)    Nitrite POSITIVE (*)    Leukocytes,Ua LARGE (*)    Bacteria, UA MANY (*)    All other components within normal limits  LIPASE, BLOOD  MAGNESIUM  TSH  HEMOGLOBIN A1C  MAGNESIUM  BASIC METABOLIC PANEL WITH GFR  CBC  TROPONIN I (HIGH SENSITIVITY)  TROPONIN I (HIGH SENSITIVITY)    EKG EKG Interpretation Date/Time:  Thursday August 21 2023 07:26:01 EDT Ventricular Rate:  104 PR Interval:    QRS Duration:  87 QT Interval:  383 QTC Calculation: 504 R Axis:   -22  Text Interpretation: Atrial flutter Borderline left axis deviation Probable anterior infarct, age indeterminate Repol abnrm, severe global ischemia (LM/MVD) Prolonged QT interval COPY Confirmed by Meridee Score [phone]) on 08/21/2023 7:45:36 AM  Radiology DG Chest Port 1 View Result Date: 08/21/2023 CLINICAL DATA:  Weakness. EXAM: PORTABLE CHEST 1 VIEW COMPARISON:  03/14/2023 FINDINGS: Low lung volumes. Cardiopericardial silhouette is at upper limits of normal for size. Interstitial markings are diffusely coarsened with chronic features. The lungs are clear without  focal pneumonia, edema, pneumothorax or pleural effusion. Hazy opacity overlying the left base is probably superimposition of soft tissues. No acute bony abnormality. Telemetry leads overlie the chest. IMPRESSION: Low volume film without acute cardiopulmonary findings. Electronically Signed   By: Kennith Center M.D.   On: 08/21/2023 07:53    Procedures Procedures    Medications Ordered in ED Medications  apixaban (ELIQUIS) tablet 5 mg (5 mg Oral Given 08/21/23 1351)  diltiazem (CARDIZEM CD) 24 hr capsule 120 mg (120 mg Oral Given 08/21/23 1224)  dutasteride (AVODART) capsule 0.5 mg (has no administration in time range)  losartan (COZAAR) tablet 50 mg (50 mg Oral Given 08/21/23 1223)  metFORMIN (GLUCOPHAGE) tablet 500 mg (has no administration in time range)  metoprolol tartrate (LOPRESSOR) tablet 50 mg (50 mg Oral Given 08/21/23 1223)  pantoprazole (PROTONIX) EC tablet 40 mg (has no administration in time range)  oxybutynin (DITROPAN) tablet 5 mg (has no administration in time range)  rosuvastatin (CRESTOR) tablet 20 mg (has no administration in time range)  Oral care mouth rinse (has no administration in time range)  acetaminophen (TYLENOL) tablet 650 mg (has no administration in time range)    Or  acetaminophen (TYLENOL) suppository 650 mg (has no administration in time range)  ondansetron (ZOFRAN) tablet 4 mg (has no administration in time range)    Or  ondansetron (ZOFRAN) injection 4 mg (has no administration in time range)  insulin aspart (novoLOG) injection 0-9 Units (has no administration in time range)  sodium chloride 0.9 % bolus 500 mL (0 mLs Intravenous Stopped 08/21/23 1914)    ED Course/ Medical Decision Making/ A&P Clinical Course as of 08/21/23 1701  Thu Aug 21, 2023  0755 Chest x-ray showing poor inspiration no clear infiltrate.  Awaiting radiology reading. [MB]  [phone] Discussed with Dr. Sherryll Burger [facility] hospitalist who will evaluate patient for admission. [MB]  Clinical Course  User Index [MB] Terrilee Files, MD                                 Medical Decision Making Amount and/or Complexity of Data Reviewed Labs: ordered. Radiology: ordered.  Risk Decision regarding hospitalization.   This patient complains of general weakness; this involves an extensive number of treatment Options and is a complaint that carries with it a high risk of complications and morbidity. The differential includes dehydration, failure to thrive, infection, metabolic derangement  I ordered, reviewed and interpreted labs, which included CBC with normal white count, low stable hemoglobin, low platelets, chemistries and LFTs unremarkable, COVID-positive, troponins flat, urinalysis positive for infection I ordered medication IV fluids and reviewed PMP when indicated. I ordered imaging studies which included chest x-ray and I independently    visualized and interpreted imaging which showed no acute findings Additional history obtained from EMS Previous records obtained and reviewed in epic including recent oncology notes I consulted [facility] hospitalist Dr. Clelia Croft and discussed lab and imaging findings and discussed disposition.  Cardiac monitoring reviewed, atrial flutter Social determinants considered, no significant barriers Critical Interventions: None  After the interventions stated above, I reevaluated the patient and found patient to be awake alert hemodynamically stable Admission and further testing considered, he would benefit from mission to the hospital.  Likely needs physical therapy and palliative care evaluation.  May need placement.         Final Clinical Impression(s) / ED Diagnoses Final diagnoses:  COVID-19 virus infection  Generalized weakness  Rectal cancer [facility])    Rx / DC Orders ED Discharge Orders     None         Terrilee Files, MD 08/21/23 1704

## 2023-08-21 NOTE — ED Notes (Signed)
 ED TO INPATIENT HANDOFF REPORT  ED Nurse Name and Phone #: Alice Reichert, RN [phone]  S Name/Age/Gender Gary Carroll 78 y.o. male Room/Bed: APA05/APA05  Code Status   Code Status: Prior  Home/SNF/Other Home Patient oriented to: self, place, time, and situation Is this baseline? Yes   Triage Complete: Triage complete  Chief Complaint COVID-19 [U07.1]  Triage Note Pt BIB [facility] from home alone with progressive worsening weakness x 3 weeks. Unable to ambulate. Endorses recent colon CA diagnosis with radiation, last treatment December. Unable to tolerate PO intake. Pt self-caths at baseline.    Allergies Allergies  Allergen Reactions   Diovan [Valsartan] Cough   Tagamet [Cimetidine] Other (See Comments)    irrittability    Level of Care/Admitting Diagnosis ED Disposition     ED Disposition  Admit   Condition  --   Comment  Hospital Area: [facility] [[id number]]  Level of Care: Med-Surg [16]  Covid Evaluation: Confirmed COVID Positive  Diagnosis: COVID-19 [[id number]]  Admitting Physician: Erick Blinks [[id number]]  Attending Physician: Maurilio Lovely D [[id number]]          B Medical/Surgery History Past Medical History:  Diagnosis Date   A-fib (HCC)    Allergic rhinitis    Arthritis    Asthma    seasonal per pt   Bilateral knee pain    OA   CAD (coronary artery disease)    Diabetes mellitus without complication (HCC)    Diverticulosis 02/2012   seen on colonoscopy   Erectile dysfunction    GERD (gastroesophageal reflux disease)    Hiatal hernia    HLD (hyperlipidemia)    HTN (hypertension)    Hydronephrosis determined by ultrasound 08/22/2015   Overactive bladder    Prediabetes    Pulmonary lesion    Ringing of ears    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 Excell Seltzer and Dr. Laneta Simmers   Severe aortic stenosis    Sleep apnea    Urinary frequency    Urinary urgency     Wears glasses    Past Surgical History:  Procedure Laterality Date   ABDOMINAL AORTOGRAM W/LOWER EXTREMITY N/A 02/05/2023   Procedure: ABDOMINAL AORTOGRAM W/LOWER EXTREMITY;  Surgeon: [name], MD;  Location: [facility] INVASIVE CV LAB;  Service: Cardiovascular;  Laterality: N/A;   CATARACT EXTRACTION W/PHACO Left 12/07/2021   Procedure: CATARACT EXTRACTION PHACO AND INTRAOCULAR LENS PLACEMENT (IOC);  Surgeon: Fabio Pierce, MD;  Location: [facility] ORS;  Service: Ophthalmology;  Laterality: Left;  CDE 7.75   CATARACT EXTRACTION W/PHACO Right 12/21/2021   Procedure: CATARACT EXTRACTION PHACO AND INTRAOCULAR LENS PLACEMENT (IOC);  Surgeon: Fabio Pierce, MD;  Location: [facility] ORS;  Service: Ophthalmology;  Laterality: Right;  CDE: 8.00   CHOLECYSTECTOMY     COLONOSCOPY  02/2012   Dr. Arlyce Dice: sigmoid, transverse, and ascending colon diverticulosis   COLONOSCOPY WITH PROPOFOL N/A 01/13/2023   Procedure: COLONOSCOPY WITH PROPOFOL;  Surgeon: Lanelle Bal, DO;  Location: [facility] ENDO SUITE;  Service: Endoscopy;  Laterality: N/A;  1:00 pm, asa 3   FINGER SURGERY Right    INTRAOPERATIVE TRANSTHORACIC ECHOCARDIOGRAM N/A 02/25/2023   Procedure: INTRAOPERATIVE TRANSTHORACIC ECHOCARDIOGRAM;  Surgeon: Tonny Bollman, MD;  Location: [facility] INVASIVE CV LAB;  Service: Open Heart Surgery;  Laterality: N/A;   prostate procedure  2017   per patient, was having trouble urinating    RIGHT HEART CATH AND CORONARY ANGIOGRAPHY N/A 02/05/2023   Procedure: RIGHT HEART  CATH AND CORONARY ANGIOGRAPHY;  Surgeon: [name], MD;  Location: [facility] INVASIVE CV LAB;  Service: Cardiovascular;  Laterality: N/A;   TONSILLECTOMY     TOTAL KNEE ARTHROPLASTY  2012   left (Dr. 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/19/2015   Procedure: TOTAL KNEE ARTHROPLASTY;  Surgeon: Dannielle Huh, MD;  Location: [facility] OR;  Service: Orthopedics;  Laterality: Right;   TRANSCATHETER AORTIC VALVE REPLACEMENT, TRANSFEMORAL N/A 02/25/2023   Procedure: Transcatheter  Aortic Valve Replacement, Transfemoral;  Surgeon: Tonny Bollman, MD;  Location: [facility] INVASIVE CV LAB;  Service: Open Heart Surgery;  Laterality: N/A;     A IV Location/Drains/Wounds Patient Lines/Drains/Airways Status     Active Line/Drains/Airways     Name Placement date Placement time Site Days   Peripheral IV 08/21/23 20 G Left Antecubital 08/21/23  0710  Antecubital  less than 1   External Urinary Catheter 08/21/23  0925  --  less than 1            Intake/Output Last 24 hours  Intake/Output Summary (Last 24 hours) at 08/21/2023 0945 Last data filed at 08/21/2023 0909 Gross per 24 hour  Intake 500 ml  Output --  Net 500 ml    Labs/Imaging Results for orders placed or performed during the hospital encounter of 08/21/23 (from the past 48 hours)  Comprehensive metabolic panel     Status: Abnormal   Collection Time: 08/21/23  7:10 AM  Result Value Ref Range   Sodium 135 135 - 145 mmol/L   Potassium 4.2 3.5 - 5.1 mmol/L   Chloride 102 98 - 111 mmol/L   CO2 24 22 - 32 mmol/L   Glucose, Bld 122 (H) 70 - 99 mg/dL    Comment: Glucose reference range applies only to samples taken after fasting for at least 8 hours.   BUN 15 8 - 23 mg/dL   Creatinine, Ser 1.61 0.61 - 1.24 mg/dL   Calcium 9.4 8.9 - 09.6 mg/dL   Total Protein 6.7 6.5 - 8.1 g/dL   Albumin 2.9 (L) 3.5 - 5.0 g/dL   AST 35 15 - 41 U/L   ALT 34 0 - 44 U/L   Alkaline Phosphatase 61 38 - 126 U/L   Total Bilirubin 0.4 0.0 - 1.2 mg/dL   GFR, Estimated >04 >54 mL/min    Comment: (NOTE) Calculated using the CKD-EPI Creatinine Equation (2021)    Anion gap 9 5 - 15    Comment: Performed at [facility], [address] [place], [place] [address]  Lipase, blood     Status: None   Collection Time: 08/21/23  7:10 AM  Result Value Ref Range   Lipase 26 11 - 51 U/L    Comment: Performed at [facility], [address], [place], [place] [address]  Troponin I (High Sensitivity)     Status: None   Collection Time: 08/21/23   7:10 AM  Result Value Ref Range   Troponin I (High Sensitivity) 7 <18 ng/L    Comment: (NOTE) Elevated high sensitivity troponin I (hsTnI) values and significant  changes across serial measurements may suggest ACS but many other  chronic and acute conditions are known to elevate hsTnI results.  Refer to the "Links" section for chest pain algorithms and additional  guidance. Performed at [facility], [address] [place], [place] [address]   CBC with Differential     Status: Abnormal   Collection Time: 08/21/23  7:10 AM  Result Value Ref Range   WBC  5.3 4.0 - 10.5 K/uL   RBC 4.27 4.22 - 5.81 MIL/uL   Hemoglobin 12.4 (L) 13.0 - 17.0 g/dL   HCT 16.1 (L) 09.6 - 04.5 %   MCV 90.2 80.0 - 100.0 fL   MCH 29.0 26.0 - 34.0 pg   MCHC 32.2 30.0 - 36.0 g/dL   RDW 40.9 (H) 81.1 - 91.4 %   Platelets 126 (L) 150 - 400 K/uL   nRBC 0.0 0.0 - 0.2 %   Neutrophils Relative % 75 %   Neutro Abs 4.0 1.7 - 7.7 K/uL   Lymphocytes Relative 13 %   Lymphs Abs 0.7 0.7 - 4.0 K/uL   Monocytes Relative 11 %   Monocytes Absolute 0.6 0.1 - 1.0 K/uL   Eosinophils Relative 0 %   Eosinophils Absolute 0.0 0.0 - 0.5 K/uL   Basophils Relative 1 %   Basophils Absolute 0.0 0.0 - 0.1 K/uL   Immature Granulocytes 0 %   Abs Immature Granulocytes 0.02 0.00 - 0.07 K/uL    Comment: Performed at [facility], [address] [place], [place] [address]  Magnesium     Status: None   Collection Time: 08/21/23  7:10 AM  Result Value Ref Range   Magnesium 1.9 1.7 - 2.4 mg/dL    Comment: Performed at [facility], [address], [place], [place] [address]  Resp panel by RT-PCR (RSV, Flu A&B, Covid) Anterior Nasal Swab     Status: Abnormal   Collection Time: 08/21/23  7:44 AM   Specimen: Anterior Nasal Swab  Result Value Ref Range   SARS Coronavirus 2 by RT PCR POSITIVE (A) NEGATIVE    Comment: (NOTE) SARS-CoV-2 target nucleic acids are DETECTED.  The SARS-CoV-2 RNA is generally detectable in upper respiratory specimens  during the acute phase of infection. Positive results are indicative of the presence of the identified virus, but do not rule out bacterial infection or co-infection with other pathogens not detected by the test. Clinical correlation with patient history and other diagnostic information is necessary to determine patient infection status. The expected result is Negative.  Fact Sheet for Patients: BloggerCourse.com  Fact Sheet for Healthcare Providers: SeriousBroker.it  This test is not yet approved or cleared by the [place] FDA and  has been authorized for detection and/or diagnosis of SARS-CoV-2 by FDA under an Emergency Use Authorization (EUA).  This EUA will remain in effect (meaning this test can be used) for the duration of  the COVID-19 declaration under Section 564(b)(1) of the A ct, 21 U.S.C. section 360bbb-3(b)(1), unless the authorization is terminated or revoked sooner.     Influenza A by PCR NEGATIVE NEGATIVE   Influenza B by PCR NEGATIVE NEGATIVE    Comment: (NOTE) The Xpert Xpress SARS-CoV-2/FLU/RSV plus assay is intended as an aid in the diagnosis of influenza from Nasopharyngeal swab specimens and should not be used as a sole basis for treatment. Nasal washings and aspirates are unacceptable for Xpert Xpress SARS-CoV-2/FLU/RSV testing.  Fact Sheet for Patients: BloggerCourse.com  Fact Sheet for Healthcare Providers: SeriousBroker.it  This test is not yet approved or cleared by the [place] FDA and has been authorized for detection and/or diagnosis of SARS-CoV-2 by FDA under an Emergency Use Authorization (EUA). This EUA will remain in effect (meaning this test can be used) for the duration of the COVID-19 declaration under Section 564(b)(1) of the Act, 21 U.S.C. section 360bbb-3(b)(1), unless the authorization is terminated or revoked.     Resp  Syncytial Virus by PCR  NEGATIVE NEGATIVE    Comment: (NOTE) Fact Sheet for Patients: BloggerCourse.com  Fact Sheet for Healthcare Providers: SeriousBroker.it  This test is not yet approved or cleared by the [place] FDA and has been authorized for detection and/or diagnosis of SARS-CoV-2 by FDA under an Emergency Use Authorization (EUA). This EUA will remain in effect (meaning this test can be used) for the duration of the COVID-19 declaration under Section 564(b)(1) of the Act, 21 U.S.C. section 360bbb-3(b)(1), unless the authorization is terminated or revoked.  Performed at [facility], [address] [place], [place] [address]    DG Chest Port 1 View Result Date: 08/21/2023 CLINICAL DATA:  Weakness. EXAM: PORTABLE CHEST 1 VIEW COMPARISON:  03/14/2023 FINDINGS: Low lung volumes. Cardiopericardial silhouette is at upper limits of normal for size. Interstitial markings are diffusely coarsened with chronic features. The lungs are clear without focal pneumonia, edema, pneumothorax or pleural effusion. Hazy opacity overlying the left base is probably superimposition of soft tissues. No acute bony abnormality. Telemetry leads overlie the chest. IMPRESSION: Low volume film without acute cardiopulmonary findings. Electronically Signed   By: Kennith Center M.D.   On: 08/21/2023 07:53    Pending Labs Unresulted Labs (From admission, onward)     Start     Ordered   08/21/23 0737  Urinalysis, Routine w reflex microscopic -Urine, Clean Catch  Once,   URGENT       Question:  Specimen Source  Answer:  Urine, Clean Catch   08/21/23 0737            Vitals/Pain Today's Vitals   08/21/23 0800 08/21/23 0815 08/21/23 0830 08/21/23 0845  BP: (!) 152/87 138/79 (!) 151/82 138/89  Pulse: (!) 103 100 (!) 103 (!) 101  Resp: (!) 27 (!) 23 (!) 25 (!) 24  Temp:      TempSrc:      SpO2: 94% 95% 96% 97%  Weight:      Height:      PainSc:         Isolation Precautions No active isolations  Medications Medications  sodium chloride 0.9 % bolus 500 mL (0 mLs Intravenous Stopped 08/21/23 0909)    Mobility walks with device     Focused Assessments Cardiac Assessment Handoff:  Cardiac Rhythm: Atrial flutter Lab Results  Component Value Date   CKTOTAL 208 12/20/2022   TROPONINI <0.03 09/30/2017   No results found for: "DDIMER" Does the Patient currently have chest pain? No    R Recommendations: See Admitting Provider Note  Report given to:   Additional Notes: Patient stated walks with walker at home but non-ambulatory x 3 wks due to weakness.

## 2023-08-21 NOTE — Consult Note (Signed)
 Consultation Note Date: 08/21/2023   Patient Name: Gary Carroll  DOB: November 11, 1945  MRN: [id number]  Age / Sex: 78 y.o., male  PCP: Benita Stabile, MD Referring Physician: Erick Blinks, DO  Reason for Consultation: Establishing goals of care  HPI/Patient Profile: 78 y.o. 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   Clinical Assessment and Goals of Care: I have reviewed medical records including EPIC notes, labs and imaging, received report from RN, assessed the patient.  Gary Carroll is sitting up in the bed in his room.  He appears chronically ill, obese.  He greets me, making and mostly keeping eye contact.  He is alert and oriented x 3, able to make his needs known.  There is no family at bedside at this time.  We meet at the bedside to discuss diagnosis prognosis, GOC, EOL wishes, disposition and options. I introduced Palliative Medicine as specialized medical care for people living with serious illness. It focuses on providing relief from the symptoms and stress of a serious illness. The goal is to improve quality of life for both the patient and the family.  We discussed a brief life review of the patient.  Gary Carroll states that he was married and divorced twice before his last wife died.  He has no children.  He does not have living siblings.  He tells me that he has a few cousins who are in their 12s and over 41 years old.  Gary Carroll states that he has been using [employer] recently to get food.  Prior to this acute illness he had been independent with ADLs/IADLs, even driving.  We then focused on their current illness.  We talked about  COVID-19 and the treatment plan.  We talked about time for outcomes.  We talked about the his weakness.  He tells me that he was expecting home health physical therapy today to help him with his functional status.  We talked about possible need for short-term rehab, evaluation if needed.  At this point he is agreeable with no preference for facility.  The natural disease trajectory and expectations at EOL were discussed.  I attempted to elicit values and goals of care important to the patient.  The difference between aggressive medical intervention and comfort care was considered in light of the patient's goals of care.   Advanced directives, concepts specific to code status, artifical feeding and hydration, and rehospitalization were considered and discussed.  We talked about the concept of "treat the treatable, but allow a natural passing".  Gary Carroll tells me that he completed advanced directives anywhere from 8 to 15 years ago.  He states that he would want attempted resuscitation.  I ask if things are different for him now that he has been diagnosed with rectal cancer.  He tells me that he has not even thought about his rectal cancer.  Palliative Care services outpatient were explained and offered.  Gary Carroll shares he will consider this.  Discussed the importance of continued conversation with family and the medical providers regarding overall plan of care and treatment options, ensuring decisions are within the context of the patient's values and GOCs.  Questions and concerns were addressed. The family was encouraged to call with questions or concerns.  PMT will continue to support holistically.  Conference with attending, bedside nursing staff, transition of care team related to patient condition, needs, goals of care, disposition.    HCPOA HCPOA -Gary Carroll tells me that his longtime friend, Jaquita Rector, is his legal HCPOA.  He shares that she has paperwork, and knows his wishes.     SUMMARY OF RECOMMENDATIONS   Full scope/full code Time for outcomes Agreeable to PT eval and possible short-term rehab if qualified/needed. Ultimate goal is to return home States no further cancer treatment at this time.   Code Status/Advance Care Planning: Full code - We talked about the concept of "treat the treatable, but allow a natural passing".  Gary Carroll tells me that he completed advanced directives anywhere from 8 to 15 years ago.  He states that he would want attempted resuscitation.  I ask if things are different for him now that he has been diagnosed with rectal cancer.  He tells me that he has not even thought about his rectal cancer.  Symptom Management:  Per hospitalist, no additional needs at this time.  Palliative Prophylaxis:  Frequent Pain Assessment and Oral Care  Additional Recommendations (Limitations, Scope, Preferences): Full Scope Treatment  Psycho-social/Spiritual:  Desire for further Chaplaincy support:no Additional Recommendations: Caregiving  Support/Resources and Education on Hospice  Prognosis:  Unable to determine, based on outcomes.   Discharge Planning: To Be Determined      Primary Diagnoses: Present on Admission:  COVID-19   I have reviewed the medical record, interviewed the patient and family, and examined the patient. The following aspects are pertinent.  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 Excell Seltzer and Dr. Laneta Simmers   Severe aortic stenosis    Sleep apnea    Urinary frequency    Urinary urgency    Wears glasses    Social History   Socioeconomic History   Marital status: Divorced    Spouse name: Not on file   Number of children: 0   Years of education: Not on file   Highest education level: Not on file  Occupational History   Occupation: retired Lobbyist)    Employer: RETIRED  Tobacco Use   Smoking status: Former    Current packs/day: 0.00    Types: Cigarettes    Quit date:  01/18/2010    Years since quitting: 13.5   Smokeless tobacco: Never   Tobacco comments:    Smoked for 25 years- quit September 2011.  Passive exposure from wife  Vaping Use   Vaping status: Never Used  Substance and Sexual Activity   Alcohol use: Not Currently    Alcohol/week: 1.0 - 2.0 standard drink of alcohol    Types: 1 - 2 Glasses of wine per week   Drug use: No   Sexual activity: Not on file  Other Topics Concern   Not on file  Social History Narrative   Separated from wife (2014) and moved to [place]. Retired- worked in Production designer, theatre/television/film.    Social Drivers of Corporate investment banker Strain: Not on file  Food Insecurity: No Food Insecurity (03/07/2023)   Hunger Vital Sign    Worried About Running Out of Food in the Last Year: Never true    Ran Out of Food in the Last Year: Never true  Transportation Needs: No Transportation Needs (03/07/2023)   PRAPARE - Administrator, Civil Service (Medical): No    Lack of Transportation (Non-Medical): No  Physical Activity: Not on file  Stress: Not on file  Social Connections: Not on file   Family History  Problem Relation Age of Onset   Uterine cancer Mother    Cancer Mother        uterine   Coronary artery disease Father    Coronary artery disease Paternal Grandfather    Heart disease Paternal Grandfather    Colon cancer Neg Hx    Esophageal cancer  Neg Hx    Stomach cancer Neg Hx    Rectal cancer Neg Hx    Liver disease Neg Hx    Scheduled Meds: Continuous Infusions: PRN Meds:. Medications Prior to Admission:  Prior to Admission medications   Medication Sig Start Date End Date Taking? Authorizing Provider  acetaminophen (TYLENOL) 650 MG CR tablet Take 1,300 mg by mouth every 8 (eight) hours as needed for pain.    [provider]  amoxicillin (AMOXIL) 500 MG tablet Take 2,000 mg by mouth as directed. take 4 tablets by mouth 1 hour prior to dental procedures and cleanings.    [provider]  apixaban (ELIQUIS) 5 MG TABS tablet Take 1 tablet (5 mg total) by mouth 2 (two) times daily. 08/24/15   Philip Aspen, Limmie Patricia, MD  cephALEXin (KEFLEX) 500 MG capsule Take 1 capsule (500 mg total) by mouth 3 (three) times daily. 04/05/23   Geoffery Lyons, MD  diltiazem (CARDIZEM CD) 120 MG 24 hr capsule Take 1 capsule (120 mg total) by mouth daily. 01/18/23   Dione Booze, MD  dutasteride (AVODART) 0.5 MG capsule Take 0.5 mg by mouth daily.    [provider]  fluconazole (DIFLUCAN) 100 MG tablet Take 1 tablet (100 mg total) by mouth daily. Take 2 tabs x 1, then 1 tab QD x 9 additional days. 04/25/23   Dorothy Puffer, MD  furosemide (LASIX) 40 MG tablet Take 1 tablet (40 mg total) by mouth 2 (two) times daily. You can take an additional 40mg  of Lasix as needed for shortness of breath or swelling Patient taking differently: Take 40 mg by mouth daily. 02/06/23   Janetta Hora, PA-C  losartan (COZAAR) 50 MG tablet Take 0.5 tablets (25 mg total) by mouth daily. Patient taking differently: Take 50 mg by mouth daily. 12/23/22   Vassie Loll, MD  Magnesium 500 MG CAPS Take 500  mg by mouth daily.    [provider]  Melatonin 10 MG CAPS Take 10 mg by mouth at bedtime.    [provider]  metFORMIN (GLUCOPHAGE) 500 MG tablet Take 500 mg by mouth 2 (two) times daily with a meal.    [provider]   metoprolol tartrate (LOPRESSOR) 50 MG tablet Take 1 tablet (50 mg total) by mouth 2 (two) times daily. 01/18/23   Dione Booze, MD  Multiple Vitamins-Minerals (MULTIVITAL) tablet Take 1 tablet by mouth daily.    [provider]  Omega-3 Fatty Acids (FISH OIL) 1000 MG CAPS Take 2,000 mg by mouth daily.    [provider]  oxybutynin (DITROPAN) 5 MG tablet Take 5 mg by mouth every evening. 06/01/19   [provider]  pantoprazole (PROTONIX) 40 MG tablet Take 40 mg by mouth daily.    [provider]  phenazopyridine (PYRIDIUM) 200 MG tablet Take 1 tablet (200 mg total) by mouth 3 (three) times daily. 03/14/23   Jacalyn Lefevre, MD  potassium chloride (KLOR-CON) 10 MEQ tablet Take 1 tablet (10 mEq total) by mouth daily. 02/06/23   Janetta Hora, PA-C  rosuvastatin (CRESTOR) 20 MG tablet Take 20 mg by mouth daily.    [provider]  vitamin C (ASCORBIC ACID) 500 MG tablet Take 500 mg by mouth daily.    [provider]   Allergies  Allergen Reactions   Diovan [Valsartan] Cough   Tagamet [Cimetidine] Other (See Comments)    irrittability   Review of Systems  Unable to perform ROS: Acuity of condition    Physical Exam Vitals and nursing note reviewed.  Constitutional:      General: He is not in acute distress.    Appearance: He is obese. He is ill-appearing.  HENT:     Mouth/Throat:     Mouth: Mucous membranes are moist.  Cardiovascular:     Rate and Rhythm: Normal rate.  Pulmonary:     Effort: Pulmonary effort is normal. No respiratory distress.  Skin:    General: Skin is warm and dry.  Neurological:     Mental Status: He is alert and oriented to person, place, and time.  Psychiatric:        Mood and Affect: Mood normal.        Behavior: Behavior normal.     Vital Signs: BP 138/89   Pulse (!) 101   Temp 97.9 F (36.6 C) (Oral)   Resp (!) 24   Ht 5\' 11"  (1.803 m)   Wt 111.1 kg   SpO2 97%   BMI 34.17 kg/m  Pain  Scale: 0-10   Pain Score: 0-No pain   SpO2: SpO2: 97 % O2 Device:SpO2: 97 % O2 Flow Rate: .   IO: Intake/output summary:  Intake/Output Summary (Last 24 hours) at 08/21/2023 0944 Last data filed at 08/21/2023 0909 Gross per 24 hour  Intake 500 ml  Output --  Net 500 ml    LBM:   Baseline Weight: Weight: 111.1 kg Most recent weight: Weight: 111.1 kg     Palliative Assessment/Data:     Time In: 0920 Time Out: 1035 Time Total: 75 minutes  Greater than 50%  of this time was spent counseling and coordinating care related to the above assessment and plan.  Signed by: Katheran Awe, NP   Please contact Palliative Medicine Team phone at [phone] for questions and concerns.  For individual provider: See Loretha Stapler

## 2023-08-22 DIAGNOSIS — N401 Enlarged prostate with lower urinary tract symptoms: Secondary | ICD-10-CM | POA: Diagnosis not present

## 2023-08-22 DIAGNOSIS — C20 Malignant neoplasm of rectum: Secondary | ICD-10-CM | POA: Diagnosis present

## 2023-08-22 DIAGNOSIS — Z743 Need for continuous supervision: Secondary | ICD-10-CM | POA: Diagnosis not present

## 2023-08-22 DIAGNOSIS — M6281 Muscle weakness (generalized): Secondary | ICD-10-CM | POA: Diagnosis not present

## 2023-08-22 DIAGNOSIS — J45909 Unspecified asthma, uncomplicated: Secondary | ICD-10-CM | POA: Diagnosis present

## 2023-08-22 DIAGNOSIS — R531 Weakness: Secondary | ICD-10-CM | POA: Diagnosis not present

## 2023-08-22 DIAGNOSIS — G4733 Obstructive sleep apnea (adult) (pediatric): Secondary | ICD-10-CM | POA: Diagnosis not present

## 2023-08-22 DIAGNOSIS — R278 Other lack of coordination: Secondary | ICD-10-CM | POA: Diagnosis not present

## 2023-08-22 DIAGNOSIS — C189 Malignant neoplasm of colon, unspecified: Secondary | ICD-10-CM | POA: Diagnosis not present

## 2023-08-22 DIAGNOSIS — R0602 Shortness of breath: Secondary | ICD-10-CM | POA: Diagnosis not present

## 2023-08-22 DIAGNOSIS — I350 Nonrheumatic aortic (valve) stenosis: Secondary | ICD-10-CM | POA: Diagnosis not present

## 2023-08-22 DIAGNOSIS — R5381 Other malaise: Secondary | ICD-10-CM | POA: Diagnosis not present

## 2023-08-22 DIAGNOSIS — Z888 Allergy status to other drugs, medicaments and biological substances status: Secondary | ICD-10-CM | POA: Diagnosis not present

## 2023-08-22 DIAGNOSIS — K219 Gastro-esophageal reflux disease without esophagitis: Secondary | ICD-10-CM | POA: Diagnosis not present

## 2023-08-22 DIAGNOSIS — Z96651 Presence of right artificial knee joint: Secondary | ICD-10-CM | POA: Diagnosis present

## 2023-08-22 DIAGNOSIS — I5030 Unspecified diastolic (congestive) heart failure: Secondary | ICD-10-CM | POA: Diagnosis not present

## 2023-08-22 DIAGNOSIS — Z515 Encounter for palliative care: Secondary | ICD-10-CM | POA: Diagnosis not present

## 2023-08-22 DIAGNOSIS — U071 COVID-19: Secondary | ICD-10-CM | POA: Diagnosis not present

## 2023-08-22 DIAGNOSIS — Z952 Presence of prosthetic heart valve: Secondary | ICD-10-CM | POA: Diagnosis not present

## 2023-08-22 DIAGNOSIS — I110 Hypertensive heart disease with heart failure: Secondary | ICD-10-CM | POA: Diagnosis present

## 2023-08-22 DIAGNOSIS — Z79899 Other long term (current) drug therapy: Secondary | ICD-10-CM | POA: Diagnosis not present

## 2023-08-22 DIAGNOSIS — Z8249 Family history of ischemic heart disease and other diseases of the circulatory system: Secondary | ICD-10-CM | POA: Diagnosis not present

## 2023-08-22 DIAGNOSIS — I480 Paroxysmal atrial fibrillation: Secondary | ICD-10-CM | POA: Diagnosis not present

## 2023-08-22 DIAGNOSIS — Z7901 Long term (current) use of anticoagulants: Secondary | ICD-10-CM | POA: Diagnosis not present

## 2023-08-22 DIAGNOSIS — E876 Hypokalemia: Secondary | ICD-10-CM | POA: Diagnosis not present

## 2023-08-22 DIAGNOSIS — Z6834 Body mass index (BMI) 34.0-34.9, adult: Secondary | ICD-10-CM | POA: Diagnosis not present

## 2023-08-22 DIAGNOSIS — I2510 Atherosclerotic heart disease of native coronary artery without angina pectoris: Secondary | ICD-10-CM | POA: Diagnosis present

## 2023-08-22 DIAGNOSIS — I10 Essential (primary) hypertension: Secondary | ICD-10-CM | POA: Diagnosis not present

## 2023-08-22 DIAGNOSIS — Z7401 Bed confinement status: Secondary | ICD-10-CM | POA: Diagnosis not present

## 2023-08-22 DIAGNOSIS — I4892 Unspecified atrial flutter: Secondary | ICD-10-CM | POA: Diagnosis present

## 2023-08-22 DIAGNOSIS — E119 Type 2 diabetes mellitus without complications: Secondary | ICD-10-CM | POA: Diagnosis not present

## 2023-08-22 DIAGNOSIS — J9601 Acute respiratory failure with hypoxia: Secondary | ICD-10-CM | POA: Diagnosis not present

## 2023-08-22 DIAGNOSIS — I5032 Chronic diastolic (congestive) heart failure: Secondary | ICD-10-CM | POA: Diagnosis present

## 2023-08-22 DIAGNOSIS — Z87891 Personal history of nicotine dependence: Secondary | ICD-10-CM | POA: Diagnosis not present

## 2023-08-22 DIAGNOSIS — Z7984 Long term (current) use of oral hypoglycemic drugs: Secondary | ICD-10-CM | POA: Diagnosis not present

## 2023-08-22 DIAGNOSIS — U099 Post covid-19 condition, unspecified: Secondary | ICD-10-CM | POA: Diagnosis not present

## 2023-08-22 DIAGNOSIS — E871 Hypo-osmolality and hyponatremia: Secondary | ICD-10-CM | POA: Diagnosis present

## 2023-08-22 DIAGNOSIS — E782 Mixed hyperlipidemia: Secondary | ICD-10-CM | POA: Diagnosis not present

## 2023-08-22 LAB — BASIC METABOLIC PANEL WITH GFR
Anion gap: 8 (ref 5–15)
BUN: 16 mg/dL (ref 8–23)
CO2: 23 mmol/L (ref 22–32)
Calcium: 8.2 mg/dL — ABNORMAL LOW (ref 8.9–10.3)
Chloride: 101 mmol/L (ref 98–111)
Creatinine, Ser: 0.79 mg/dL (ref 0.61–1.24)
GFR, Estimated: >60 mL/min (ref >60)
Glucose, Bld: 116 mg/dL — ABNORMAL HIGH (ref 70–99)
Potassium: 3.9 mmol/L (ref 3.5–5.1)
Sodium: 132 mmol/L — ABNORMAL LOW (ref 135–145)

## 2023-08-22 LAB — GLUCOSE, CAPILLARY
Glucose-Capillary: 118 mg/dL — ABNORMAL HIGH (ref 70–99)
Glucose-Capillary: 119 mg/dL — ABNORMAL HIGH (ref 70–99)
Glucose-Capillary: 113 mg/dL — ABNORMAL HIGH (ref 70–99)
Glucose-Capillary: 134 mg/dL — ABNORMAL HIGH (ref 70–99)

## 2023-08-22 LAB — CBC
HCT: 39.6 % (ref 39.0–52.0)
Hemoglobin: 12.8 g/dL — ABNORMAL LOW (ref 13.0–17.0)
MCH: 29 pg (ref 26.0–34.0)
MCHC: 32.3 g/dL (ref 30.0–36.0)
MCV: 89.8 fL (ref 80.0–100.0)
Platelets: 115 10*3/uL — ABNORMAL LOW (ref 150–400)
RBC: 4.41 MIL/uL (ref 4.22–5.81)
RDW: 16.2 % — ABNORMAL HIGH (ref 11.5–15.5)
WBC: 5.5 10*3/uL (ref 4.0–10.5)
nRBC: 0 % (ref 0.0–0.2)

## 2023-08-22 LAB — HEMOGLOBIN A1C
Hgb A1c MFr Bld: 6.1 % — ABNORMAL HIGH (ref 4.8–5.6)
Mean Plasma Glucose: 128.37 mg/dL

## 2023-08-22 LAB — MAGNESIUM: Magnesium: 1.7 mg/dL (ref 1.7–2.4)

## 2023-08-22 NOTE — Plan of Care (Signed)
  Problem: Education: Goal: Knowledge of risk factors and measures for prevention of condition will improve Outcome: Progressing   Problem: Coping: Goal: Psychosocial and spiritual needs will be supported Outcome: Progressing   Problem: Respiratory: Goal: Will maintain a patent airway Outcome: Progressing Goal: Complications related to the disease process, condition or treatment will be avoided or minimized Outcome: Progressing   Problem: Education: Goal: Knowledge of General Education information will improve Description: Including pain rating scale, medication(s)/side effects and non-pharmacologic comfort measures Outcome: Progressing   Problem: Health Behavior/Discharge Planning: Goal: Ability to manage health-related needs will improve Outcome: Progressing   Problem: Clinical Measurements: Goal: Ability to maintain clinical measurements within normal limits will improve Outcome: Progressing Goal: Will remain free from infection Outcome: Progressing Goal: Diagnostic test results will improve Outcome: Progressing Goal: Respiratory complications will improve Outcome: Progressing Goal: Cardiovascular complication will be avoided Outcome: Progressing   Problem: Activity: Goal: Risk for activity intolerance will decrease Outcome: Progressing   Problem: Nutrition: Goal: Adequate nutrition will be maintained Outcome: Progressing   Problem: Coping: Goal: Level of anxiety will decrease Outcome: Progressing   Problem: Elimination: Goal: Will not experience complications related to bowel motility Outcome: Progressing Goal: Will not experience complications related to urinary retention Outcome: Progressing   Problem: Pain Managment: Goal: General experience of comfort will improve and/or be controlled Outcome: Progressing   Problem: Safety: Goal: Ability to remain free from injury will improve Outcome: Progressing   Problem: Skin Integrity: Goal: Risk for impaired  skin integrity will decrease Outcome: Progressing   Problem: Education: Goal: Ability to describe self-care measures that may prevent or decrease complications (Diabetes Survival Skills Education) will improve Outcome: Progressing Goal: Individualized Educational Video(s) Outcome: Progressing   Problem: Coping: Goal: Ability to adjust to condition or change in health will improve Outcome: Progressing   Problem: Fluid Volume: Goal: Ability to maintain a balanced intake and output will improve Outcome: Progressing   Problem: Health Behavior/Discharge Planning: Goal: Ability to identify and utilize available resources and services will improve Outcome: Progressing Goal: Ability to manage health-related needs will improve Outcome: Progressing   Problem: Metabolic: Goal: Ability to maintain appropriate glucose levels will improve Outcome: Progressing   Problem: Nutritional: Goal: Maintenance of adequate nutrition will improve Outcome: Progressing Goal: Progress toward achieving an optimal weight will improve Outcome: Progressing   Problem: Skin Integrity: Goal: Risk for impaired skin integrity will decrease Outcome: Progressing   Problem: Tissue Perfusion: Goal: Adequacy of tissue perfusion will improve Outcome: Progressing

## 2023-08-22 NOTE — Progress Notes (Addendum)
 PROGRESS NOTE    Gary Carroll  ZOX:[id number] DOB: 03-20-1946 DOA: 08/21/2023 PCP: Benita Stabile, MD   Brief Narrative:    Gary Carroll is a 78 y.o. male with medical history significant for severe AS status post TAVR, PAF on Eliquis, CAD, rectal adenocarcinoma, hypertension, dyslipidemia, type 2 diabetes, and chronic HFpEF who presented to the ED with significant weakness over the last several days and this has been progressively worsening over the last 1-2 weeks.  He is noted to have COVID-19 infection and is symptomatic from this.  PT recommending SNF.  Assessment & Plan:   Principal Problem:   COVID-19 Active Problems:   Essential (primary) hypertension   Mixed hyperlipidemia   Gastroesophageal reflux disease without esophagitis   AF (paroxysmal atrial fibrillation) (HCC)   Chronic anticoagulation   Aortic stenosis   Obstructive sleep apnea (adult) (pediatric)   (HFpEF) heart failure with preserved ejection fraction (HCC)   Primary adenocarcinoma of colon (HCC)   Morbid obesity (HCC)  Assessment and Plan:   Symptomatic COVID-19 infection with possible failure to thrive -No current need for treatment with no findings on chest x-ray or any significant hypoxemia -Given significant weakness, PT has assessed and is now recommending SNF. -Assess dietary intake   PAF/flutter -Currently stable on telemetry -Continue home diltiazem and metoprolol -Eliquis for anticoagulation  Mild hyponatremia -Continue to monitor   OSA -On CPAP   Hypertension -Continue home medications   Dyslipidemia -Continue statin   Chronic HFpEF -Currently appears euvolemic and given IV fluid bolus in ED -Plan to resume Lasix if tolerating diet   Type 2 diabetes -Continue metformin -Carb diet -SSI     Rectal adenocarcinoma -Follows with oncologist at [facility] and currently not on any further chemotherapy or radiation -Apparently not a candidate for surgery -Overall prognosis  appears poor -Appreciate palliative consultation for goals of care discussion   History of severe AS status post TAVR -Successful TAVR with 26 mm Edwards SAPIEN 3 THV on 02/25/2023   Obesity, class I -BMI 34.17    DVT prophylaxis:apixaban Code Status: Full Family Communication: None at bedside Disposition Plan:  Status is: Inpatient Remains inpatient appropriate because: Need for SNF placement.   Consultants:  Palliative care  Procedures:  None  Antimicrobials:  None   Subjective: Patient seen and evaluated today with no new acute complaints or concerns. No acute concerns or events noted overnight.  He continues to feel weak.  Objective: Vitals:   08/21/23 2128 08/22/23 0054 08/22/23 0448 08/22/23 0848  BP: (!) 148/77 (!) 147/92 (!) 155/90 (!) 148/81  Pulse: (!) 103 98 (!) 52 65  Resp: 20 18 20    Temp: 98.7 F (37.1 C) 98.7 F (37.1 C) 98.3 F (36.8 C)   TempSrc: Oral Oral    SpO2: 96% 94% 95%   Weight:      Height:        Intake/Output Summary (Last 24 hours) at 08/22/2023 1157 Last data filed at 08/22/2023 0400 Gross per 24 hour  Intake 240 ml  Output 700 ml  Net -460 ml   Filed Weights   08/21/23 0710  Weight: 111.1 kg    Examination:  General exam: Appears calm and comfortable  Respiratory system: Clear to auscultation. Respiratory effort normal. Cardiovascular system: S1 & S2 heard, RRR.  Gastrointestinal system: Abdomen is soft Central nervous system: Alert and awake Extremities: No edema Skin: No significant lesions noted Psychiatry: Flat affect.    Data Reviewed: I have personally reviewed following labs  and imaging studies  CBC: Recent Labs  Lab 08/21/23 0710 08/22/23 0458  WBC 5.3 5.5  NEUTROABS 4.0  --   HGB 12.4* 12.8*  HCT 38.5* 39.6  MCV 90.2 89.8  PLT 126* 115*   Basic Metabolic Panel: Recent Labs  Lab 08/21/23 0710 08/22/23 0458  NA 135 132*  K 4.2 3.9  CL 102 101  CO2 24 23  GLUCOSE 122* 116*  BUN 15 16   CREATININE 0.74 0.79  CALCIUM 9.4 8.2*  MG 1.9 1.7   GFR: Estimated Creatinine Clearance: 96.4 mL/min (by C-G formula based on SCr of 0.79 mg/dL). Liver Function Tests: Recent Labs  Lab 08/21/23 0710  AST 35  ALT 34  ALKPHOS 61  BILITOT 0.4  PROT 6.7  ALBUMIN 2.9*   Recent Labs  Lab 08/21/23 0710  LIPASE 26   No results for input(s): "AMMONIA" in the last 168 hours. Coagulation Profile: No results for input(s): "INR", "PROTIME" in the last 168 hours. Cardiac Enzymes: No results for input(s): "CKTOTAL", "CKMB", "CKMBINDEX", "TROPONINI" in the last 168 hours. BNP (last 3 results) No results for input(s): "PROBNP" in the last 8760 hours. HbA1C: No results for input(s): "HGBA1C" in the last 72 hours. CBG: Recent Labs  Lab 08/22/23 0733 08/22/23 1108  GLUCAP 118* 119*   Lipid Profile: No results for input(s): "CHOL", "HDL", "LDLCALC", "TRIG", "CHOLHDL", "LDLDIRECT" in the last 72 hours. Thyroid Function Tests: Recent Labs    08/21/23 0951  TSH 1.656   Anemia Panel: No results for input(s): "VITAMINB12", "FOLATE", "FERRITIN", "TIBC", "IRON", "RETICCTPCT" in the last 72 hours. Sepsis Labs: No results for input(s): "PROCALCITON", "LATICACIDVEN" in the last 168 hours.  Recent Results (from the past 240 hours)  Resp panel by RT-PCR (RSV, Flu A&B, Covid) Anterior Nasal Swab     Status: Abnormal   Collection Time: 08/21/23  7:44 AM   Specimen: Anterior Nasal Swab  Result Value Ref Range Status   SARS Coronavirus 2 by RT PCR POSITIVE (A) NEGATIVE Final    Comment: (NOTE) SARS-CoV-2 target nucleic acids are DETECTED.  The SARS-CoV-2 RNA is generally detectable in upper respiratory specimens during the acute phase of infection. Positive results are indicative of the presence of the identified virus, but do not rule out bacterial infection or co-infection with other pathogens not detected by the test. Clinical correlation with patient history and other diagnostic  information is necessary to determine patient infection status. The expected result is Negative.  Fact Sheet for Patients: BloggerCourse.com  Fact Sheet for Healthcare Providers: SeriousBroker.it  This test is not yet approved or cleared by the [place] FDA and  has been authorized for detection and/or diagnosis of SARS-CoV-2 by FDA under an Emergency Use Authorization (EUA).  This EUA will remain in effect (meaning this test can be used) for the duration of  the COVID-19 declaration under Section 564(b)(1) of the A ct, 21 U.S.C. section 360bbb-3(b)(1), unless the authorization is terminated or revoked sooner.     Influenza A by PCR NEGATIVE NEGATIVE Final   Influenza B by PCR NEGATIVE NEGATIVE Final    Comment: (NOTE) The Xpert Xpress SARS-CoV-2/FLU/RSV plus assay is intended as an aid in the diagnosis of influenza from Nasopharyngeal swab specimens and should not be used as a sole basis for treatment. Nasal washings and aspirates are unacceptable for Xpert Xpress SARS-CoV-2/FLU/RSV testing.  Fact Sheet for Patients: BloggerCourse.com  Fact Sheet for Healthcare Providers: SeriousBroker.it  This test is not yet approved or cleared by the [place]  States FDA and has been authorized for detection and/or diagnosis of SARS-CoV-2 by FDA under an Emergency Use Authorization (EUA). This EUA will remain in effect (meaning this test can be used) for the duration of the COVID-19 declaration under Section 564(b)(1) of the Act, 21 U.S.C. section 360bbb-3(b)(1), unless the authorization is terminated or revoked.     Resp Syncytial Virus by PCR NEGATIVE NEGATIVE Final    Comment: (NOTE) Fact Sheet for Patients: BloggerCourse.com  Fact Sheet for Healthcare Providers: SeriousBroker.it  This test is not yet approved or cleared by the  [place] FDA and has been authorized for detection and/or diagnosis of SARS-CoV-2 by FDA under an Emergency Use Authorization (EUA). This EUA will remain in effect (meaning this test can be used) for the duration of the COVID-19 declaration under Section 564(b)(1) of the Act, 21 U.S.C. section 360bbb-3(b)(1), unless the authorization is terminated or revoked.  Performed at [facility], [address], [place], [place] [address]          Radiology Studies: [facility] 1 View Result Date: 08/21/2023 CLINICAL DATA:  Weakness. EXAM: PORTABLE CHEST 1 VIEW COMPARISON:  03/14/2023 FINDINGS: Low lung volumes. Cardiopericardial silhouette is at upper limits of normal for size. Interstitial markings are diffusely coarsened with chronic features. The lungs are clear without focal pneumonia, edema, pneumothorax or pleural effusion. Hazy opacity overlying the left base is probably superimposition of soft tissues. No acute bony abnormality. Telemetry leads overlie the chest. IMPRESSION: Low volume film without acute cardiopulmonary findings. Electronically Signed   By: Kennith Center M.D.   On: 08/21/2023 07:53        Scheduled Meds:  apixaban  5 mg Oral BID   diltiazem  120 mg Oral Daily   dutasteride  0.5 mg Oral Daily   insulin aspart  0-9 Units Subcutaneous TID WC   losartan  50 mg Oral Daily   metFORMIN  500 mg Oral BID WC   metoprolol tartrate  50 mg Oral BID   oxybutynin  5 mg Oral QPM   pantoprazole  40 mg Oral Daily   rosuvastatin  20 mg Oral Daily     LOS: 0 days    Time spent: 35 minutes    Gary Slabach Hoover Brunette, DO [facility]  If 7PM-7AM, please contact night-coverage [email or web address] 08/22/2023, 11:57 AM

## 2023-08-22 NOTE — Evaluation (Signed)
 Occupational Therapy Evaluation Patient Details Name: Gary Carroll MRN: [id number] DOB: 1945-11-19 Today's Date: 08/22/2023   History of Present Illness   Gary Carroll is a 78 y.o. male with medical history significant for severe AS status post TAVR, PAF on Eliquis, CAD, rectal adenocarcinoma, hypertension, dyslipidemia, type 2 diabetes, and chronic HFpEF who presented to the ED with significant weakness over the last several days and this has been progressively worsening over the last 1-2 weeks.  He lives by himself and at baseline has difficulty ambulating and uses a walker, but states now that he is not able to get out of bed and has poor appetite.  He states he was having low-grade fevers at home as well but denies any cough or shortness of breath or chest pain.  He has otherwise been taking his home medications and followed up with his oncologist at the [place] last week.  He states that he has been too weak to tolerate chemotherapy and is no longer candidate for any surgical measures given his cancer. (per DO)     Clinical Impressions Pt agreeable to OT and PT evaluation. Pt reported having a bowel movement; max/total assist for peri-care with pt supine in bed. Mod A for bed mobility and CGA to min A for transfer to chair and short ambulation in the room. Pt also unable to complete lower body dressing with reports that he would have a bowel movement if he bent over. Pt is a high fall risk given that he lives alone. Pt was left in the chair with call bell within reach. Pt will benefit from continued OT in the hospital and recommended venue below to increase strength, balance, and endurance for safe ADL's.        If plan is discharge home, recommend the following:   A little help with walking and/or transfers;A lot of help with bathing/dressing/bathroom;Assistance with cooking/housework;Assist for transportation;Help with stairs or ramp for entrance     Functional Status  Assessment   Patient has had a recent decline in their functional status and demonstrates the ability to make significant improvements in function in a reasonable and predictable amount of time.     Equipment Recommendations   None recommended by OT (possibly a tub bench if he decides to return home.)             Precautions/Restrictions   Precautions Precautions: Fall Recall of Precautions/Restrictions: Intact Restrictions Weight Bearing Restrictions Per Provider Order: No     Mobility Bed Mobility Overal bed mobility: Needs Assistance Bed Mobility: Rolling, Sidelying to Sit Rolling: Mod assist, Min assist Sidelying to sit: Mod assist       General bed mobility comments: slow labored movement    Transfers Overall transfer level: Needs assistance Equipment used: Rolling walker (2 wheels) Transfers: Sit to/from Stand, Bed to chair/wheelchair/BSC Sit to Stand: Min assist     Step pivot transfers: Contact guard assist, Min assist     General transfer comment: labored movement; slow pace; pt reports weakness.      Balance Overall balance assessment: Needs assistance Sitting-balance support: No upper extremity supported, Feet supported Sitting balance-Leahy Scale: Good Sitting balance - Comments: seated at EOB   Standing balance support: Bilateral upper extremity supported, During functional activity, Reliant on assistive device for balance Standing balance-Leahy Scale: Fair Standing balance comment: using RW  ADL either performed or assessed with clinical judgement   ADL Overall ADL's : Needs assistance/impaired     Grooming: Set up;Sitting   Upper Body Bathing: Set up;Sitting   Lower Body Bathing: Maximal assistance;Sitting/lateral leans   Upper Body Dressing : Set up;Sitting   Lower Body Dressing: Maximal assistance;Sitting/lateral leans   Toilet Transfer: Minimal assistance;Rolling walker (2  wheels);Stand-pivot   Toileting- Clothing Manipulation and Hygiene: Maximal assistance;Total assistance;Bed level       Functional mobility during ADLs: Contact guard assist General ADL Comments: Able to walk ~6 to 8 feet total in the room.     Vision Baseline Vision/History: 1 Wears glasses Ability to See in Adequate Light: 0 Adequate Patient Visual Report: No change from baseline Vision Assessment?: Wears glasses for reading     Perception Perception: Not tested       Praxis Praxis: Not tested       Pertinent Vitals/Pain Pain Assessment Pain Assessment: No/denies pain     Extremity/Trunk Assessment Upper Extremity Assessment Upper Extremity Assessment: Generalized weakness   Lower Extremity Assessment Lower Extremity Assessment: Defer to PT evaluation   Cervical / Trunk Assessment Cervical / Trunk Assessment: Normal   Communication Communication Communication: No apparent difficulties   Cognition Arousal: Alert Behavior During Therapy: WFL for tasks assessed/performed Cognition: No apparent impairments                               Following commands: Intact       Cueing  General Comments   Cueing Techniques: Verbal cues                 Home Living Family/patient expects to be discharged to:: Private residence Living Arrangements: Alone Available Help at Discharge: Friend(s);Available PRN/intermittently Type of Home: Apartment Home Access: Stairs to enter Entrance Stairs-Number of Steps: 4 Entrance Stairs-Rails: Right;Left Home Layout: One level     Bathroom Shower/Tub: Chief Strategy Officer: Standard (BSC plased over the toilet) Bathroom Accessibility: Yes   Home Equipment: Agricultural consultant (2 wheels);BSC/3in1;Shower seat          Prior Functioning/Environment Prior Level of Function : Independent/Modified Independent             Mobility Comments: [place]ous[place] ambulator with RW ADLs Comments: Indepdnent  ADL; fearful of shower transfer at home.    OT Problem List: Decreased strength;Decreased activity tolerance;Impaired balance (sitting and/or standing)   OT Treatment/Interventions: Self-care/ADL training;Therapeutic exercise;DME and/or AE instruction;Therapeutic activities;Patient/family education;Balance training      OT Goals(Current goals can be found in the care plan section)   Acute Rehab OT Goals Patient Stated Goal: increase strength OT Goal Formulation: With patient Time For Goal Achievement: 09/05/23 Potential to Achieve Goals: Good   OT Frequency:  Min 2X/week    Co-evaluation PT/OT/SLP Co-Evaluation/Treatment: Yes Reason for Co-Treatment: To address functional/ADL transfers   OT goals addressed during session: ADL's and self-care                       End of Session Equipment Utilized During Treatment: Rolling walker (2 wheels);Gait belt  Activity Tolerance: Patient tolerated treatment well Patient left: in chair;with call bell/phone within reach  OT Visit Diagnosis: Unsteadiness on feet (R26.81);Other abnormalities of gait and mobility (R26.89);Muscle weakness (generalized) (M62.81)                Time: 0981-1914 OT Time Calculation (min): 26 min Charges:  OT  General Charges $OT Visit: 1 Visit OT Evaluation $OT Eval Low Complexity: 1 Low  Aleea Hendry OT, MOT  Danie Chandler 08/22/2023, 11:14 AM

## 2023-08-22 NOTE — TOC Initial Note (Addendum)
 Transition of Care [facility]) - Initial/Assessment Note    Patient Details  Name: Gary Carroll MRN: [id number] Date of Birth: 05/30/1945  Transition [facility]) CM/SW Contact:    Villa Herb, LCSWA Phone Number: 08/22/2023, 11:37 AM  Clinical Narrative:                 CSW updated by MD that PT is recommending SNF for pt. CSW spoke with pt about this and he is agreeable to SNF referral being sent out, he prefers to stay in [place] if possible. CSW to complete referral and send out once PT eval note is in. [place] notified of pts hospital admission, notification ID is  [phone]. TOC to follow.   Expected Discharge Plan: Skilled Nursing Facility Barriers to Discharge: Continued Medical Work up   Patient Goals and CMS Choice Patient states their goals for this hospitalization and ongoing recovery are:: Get stronger CMS Medicare.gov Compare Post Acute Care list provided to:: Patient Choice offered to / list presented to : Patient [place] ownership interest in [facility].provided to:: Patient    Expected Discharge Plan and Services In-house Referral: Clinical Social Work Discharge Planning Services: CM Consult Post Acute Care Choice: Skilled Nursing Facility Living arrangements for the past 2 months: Single Family Home                                      Prior Living Arrangements/Services Living arrangements for the past 2 months: Single Family Home Lives with:: Self Patient language and need for interpreter reviewed:: Yes Do you feel safe going back to the place where you live?: Yes      Need for Family Participation in Patient Care: Yes (Comment) Care giver support system in place?: Yes (comment)   Criminal Activity/Legal Involvement Pertinent to Current Situation/Hospitalization: No - Comment as needed  Activities of Daily Living   ADL Screening (condition at time of admission) Independently performs ADLs?: No Does the patient have  a NEW difficulty with bathing/dressing/toileting/self-feeding that is expected to last >3 days?: Yes (Initiates electronic notice to provider for possible OT consult) Does the patient have a NEW difficulty with getting in/out of bed, walking, or climbing stairs that is expected to last >3 days?: Yes (Initiates electronic notice to provider for possible PT consult) Does the patient have a NEW difficulty with communication that is expected to last >3 days?: No Is the patient deaf or have difficulty hearing?: No Does the patient have difficulty seeing, even when wearing glasses/contacts?: No Does the patient have difficulty concentrating, remembering, or making decisions?: No  Permission Sought/Granted                  Emotional Assessment Appearance:: Appears stated age Attitude/Demeanor/Rapport: Engaged Affect (typically observed): Accepting Orientation: : Oriented to Self, Oriented to Place, Oriented to  Time, Oriented to Situation Alcohol / Substance Use: Not Applicable Psych Involvement: No (comment)  Admission diagnosis:  COVID-19 [U07.1] Patient Active Problem List   Diagnosis Date Noted   COVID-19 08/21/2023   Diarrhea 03/20/2023   Transportation insecurity 03/07/2023   Presence of intraocular lens 03/07/2023   Pain in left shoulder 03/07/2023   Encounter for screening for malignant neoplasm of respiratory organs 03/07/2023   Other secondary cataract, right eye 03/07/2023   Other nonspecific abnormal finding of lung field 03/07/2023   Aortic valve disorder 03/07/2023   Osteopenia 03/07/2023   Non-pressure  chronic ulcer of unspecified part of right lower leg with unspecified severity (HCC) 03/07/2023   Need for assistance with personal care 03/07/2023   Hyperglycemia, unspecified 03/07/2023   Edema, unspecified 03/07/2023   Disease of anus and rectum, unspecified 03/07/2023   Chronic venous insufficiency 03/07/2023   Bilateral ankle joint pain 03/07/2023   Age-related  nuclear cataract, bilateral 03/07/2023   CAD (coronary artery disease) 02/25/2023   (HFpEF) heart failure with preserved ejection fraction (HCC) 02/25/2023   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   Acute hypoxemic respiratory failure (HCC) 01/27/2023   Nonrheumatic aortic valve stenosis 01/27/2023   Primary adenocarcinoma of colon (HCC) 01/27/2023   Insomnia 01/27/2023   Acute heart failure with preserved ejection fraction (HFpEF) (HCC) 01/20/2023   SOB (shortness of breath) 12/23/2022   Acute respiratory failure with hypoxia (HCC) 12/21/2022   Elevated troponin 12/21/2022   Rectal pain 12/04/2022   Rectal bleed 12/04/2022   Hemorrhoids 08/29/2022   Chronic constipation 06/24/2022   Occult blood in stools 06/24/2022   CAP (community acquired pneumonia) 06/03/2022   Urethritis 05/01/2022   Candidiasis of penis 05/01/2022   Spasm 04/29/2022   Hardening of the aorta (main artery of the heart) (HCC) 04/29/2022   Complication due to secondary diabetes mellitus (HCC) 04/29/2022   Chronic obstructive pulmonary disease (HCC) 04/29/2022   Obstructive sleep apnea (adult) (pediatric) 11/29/2021   Potassium disorder 11/26/2021   Aortic valve stenosis 10/18/2021   Pressure injury of sacral region, stage 1 10/18/2021   Bilateral lower extremity edema 10/18/2021   Acute urinary tract infection 09/29/2021   Dysuria 09/29/2021   Blister 08/17/2021   Upper respiratory infection 07/05/2021   Cough 07/05/2021   Restrictive lung disease 06/26/2021   Multiple nodules of lung 04/30/2021   Otalgia of left ear 04/04/2021   Acute otitis media 04/04/2021   Tinea cruris 03/19/2021   Hard stool 03/19/2021   Chronic anticoagulation 03/14/2021   Aortic stenosis 03/14/2021   Pressure ulcer of buttock 02/03/2021   Acute respiratory failure with hypoxia and hypercapnia (HCC)    Abnormal electron beam computed tomography  01/01/2021   Abnormal CT of the chest 10/23/2020   History of tobacco use 10/23/2020   Paroxysmal atrial fibrillation (HCC) 09/25/2020   Type 2 diabetes mellitus (HCC) 09/25/2020   Senile osteoporosis 09/25/2020   Retention of urine 09/25/2020   Morbid obesity (HCC) 09/25/2020   Chronic combined systolic and diastolic heart failure (HCC) 09/25/2020   Atherosclerosis of coronary artery without angina pectoris 09/25/2020   Lower urinary tract symptoms due to benign prostatic hyperplasia 09/25/2020   Chronic postoperative pain 03/19/2018   Elevated transaminase level    Obstructive sleep apnea syndrome 10/01/2017   Benign prostatic hyperplasia with urinary obstruction 02/04/2017   Urinary tract infection 02/04/2017   Cellulitis of left lower extremity 02/03/2017   Gastroesophageal reflux disease without esophagitis 02/03/2017   AF (paroxysmal atrial fibrillation) (HCC) 02/03/2017   Sepsis due to cellulitis (HCC) 02/03/2017   New onset atrial fibrillation (HCC) 08/24/2015   Diuresis excessive 08/22/2015   Bladder outlet obstruction 08/22/2015   Hydronephrosis determined by ultrasound 08/22/2015   AKI (acute kidney injury) (HCC) 08/22/2015   Hypernatremia 08/22/2015   Acute blood loss anemia 08/22/2015   Hematuria 08/22/2015   History of total left knee replacement (TKR) 06/19/2015   Primary osteoarthritis of right knee 06/09/2015   Chronic pain of right knee 05/23/2015   Peripheral edema 08/31/2012   Urinary  frequency 08/31/2012   Diverticulosis 03/23/2012   Impaired fasting glucose 02/06/2012   Decreased libido 03/07/2011   Erectile dysfunction 03/07/2011   Essential (primary) hypertension 12/20/2010   Mixed hyperlipidemia 12/20/2010   ALLERGIC RHINITIS, SEASONAL 05/02/2010   SCIATICA 05/02/2010   History of cellulitis 05/02/2010   PCP:  Benita Stabile, MD Pharmacy:   [facility] [phone] - [place], [place] - [address] AT [facility] [address] [place]  [address] Phone: [phone] Fax: [phone]  [facility] - [place], [place] - [address] [place] [address] Phone: [phone] Fax: [phone]     Social Drivers of Health (SDOH) Social History: SDOH Screenings   Food Insecurity: No Food Insecurity (08/21/2023)  Housing: Low Risk  (08/21/2023)  Transportation Needs: No Transportation Needs (08/21/2023)  Utilities: Not At Risk (08/21/2023)  Alcohol Screen: Low Risk  (02/12/2023)  Depression (PHQ2-9): Low Risk  (02/12/2023)  Social Connections: Moderately Isolated (08/21/2023)  Tobacco Use: Medium Risk (08/21/2023)  Health Literacy: Adequate Health Literacy (03/07/2023)   SDOH Interventions:     Readmission Risk Interventions    02/27/2023   11:39 AM 02/07/2021    1:55 PM  Readmission Risk Prevention Plan  Transportation Screening Complete Complete  PCP or Specialist Appt within 5-7 Days  Complete  Home Care Screening  Complete  Medication Review (RN CM)  Referral to Pharmacy  HRI or Home Care Consult Complete   Social Work Consult for Recovery Care Planning/Counseling Complete   Palliative Care Screening Not Applicable   Medication Review Ocean[employer]) Complete

## 2023-08-22 NOTE — [place] FL2 (Signed)
 [place] MEDICAID FL2 LEVEL OF CARE FORM     IDENTIFICATION  Patient Name: Gary Carroll Birthdate: 06-May-1946 Sex: male Admission Date (Current Location): 08/21/2023  [facility] [place] Number:  [employer] and Address:  [facility],  [address], [place] [address]      Provider Number: [id number]  Attending Physician Name and Address:  Erick Blinks, DO  Relative Name and Phone Number:       Current Level of Care: Hospital Recommended Level of Care: Skilled Nursing Facility Prior Approval Number:    Date Approved/Denied:   PASRR Number: [id number]  Discharge Plan: SNF    Current Diagnoses: Patient Active Problem List   Diagnosis Date Noted   COVID-19 08/21/2023   Diarrhea 03/20/2023   Transportation insecurity 03/07/2023   Presence of intraocular lens 03/07/2023   Pain in left shoulder 03/07/2023   Encounter for screening for malignant neoplasm of respiratory organs 03/07/2023   Other secondary cataract, right eye 03/07/2023   Other nonspecific abnormal finding of lung field 03/07/2023   Aortic valve disorder 03/07/2023   Osteopenia 03/07/2023   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 with unspecified severity (HCC) 03/07/2023   Need for assistance with personal care 03/07/2023   Hyperglycemia, unspecified 03/07/2023   Edema, unspecified 03/07/2023   Disease of anus and rectum, unspecified 03/07/2023   Chronic venous insufficiency 03/07/2023   Bilateral ankle joint pain 03/07/2023   Age-related nuclear cataract, bilateral 03/07/2023   CAD (coronary artery disease) 02/25/2023   (HFpEF) heart failure with preserved ejection fraction (HCC) 02/25/2023   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   Acute hypoxemic respiratory failure (HCC) 01/27/2023   Nonrheumatic aortic valve stenosis 01/27/2023   Primary adenocarcinoma of  colon (HCC) 01/27/2023   Insomnia 01/27/2023   Acute heart failure with preserved ejection fraction (HFpEF) (HCC) 01/20/2023   SOB (shortness of breath) 12/23/2022   Acute respiratory failure with hypoxia (HCC) 12/21/2022   Elevated troponin 12/21/2022   Rectal pain 12/04/2022   Rectal bleed 12/04/2022   Hemorrhoids 08/29/2022   Chronic constipation 06/24/2022   Occult blood in stools 06/24/2022   CAP (community acquired pneumonia) 06/03/2022   Urethritis 05/01/2022   Candidiasis of penis 05/01/2022   Spasm 04/29/2022   Hardening of the aorta (main artery of the heart) (HCC) 04/29/2022   Complication due to secondary diabetes mellitus (HCC) 04/29/2022   Chronic obstructive pulmonary disease (HCC) 04/29/2022   Obstructive sleep apnea (adult) (pediatric) 11/29/2021   Potassium disorder 11/26/2021   Aortic valve stenosis 10/18/2021   Pressure injury of sacral region, stage 1 10/18/2021   Bilateral lower extremity edema 10/18/2021   Acute urinary tract infection 09/29/2021   Dysuria 09/29/2021   Blister 08/17/2021   Upper respiratory infection 07/05/2021   Cough 07/05/2021   Restrictive lung disease 06/26/2021   Multiple nodules of lung 04/30/2021   Otalgia of left ear 04/04/2021   Acute otitis media 04/04/2021   Tinea cruris 03/19/2021   Hard stool 03/19/2021   Chronic anticoagulation 03/14/2021   Aortic stenosis 03/14/2021   Pressure ulcer of buttock 02/03/2021   Acute respiratory failure with hypoxia and hypercapnia (HCC)    Abnormal electron beam computed tomography 01/01/2021   Abnormal CT of the chest 10/23/2020   History of tobacco use 10/23/2020   Paroxysmal atrial fibrillation (HCC) 09/25/2020   Type 2 diabetes mellitus (HCC) 09/25/2020   Senile osteoporosis 09/25/2020  Retention of urine 09/25/2020   Morbid obesity (HCC) 09/25/2020   Chronic combined systolic and diastolic heart failure (HCC) 09/25/2020   Atherosclerosis of coronary artery without angina pectoris  09/25/2020   Lower urinary tract symptoms due to benign prostatic hyperplasia 09/25/2020   Chronic postoperative pain 03/19/2018   Elevated transaminase level    Obstructive sleep apnea syndrome 10/01/2017   Benign prostatic hyperplasia with urinary obstruction 02/04/2017   Urinary tract infection 02/04/2017   Cellulitis of left lower extremity 02/03/2017   Gastroesophageal reflux disease without esophagitis 02/03/2017   AF (paroxysmal atrial fibrillation) (HCC) 02/03/2017   Sepsis due to cellulitis (HCC) 02/03/2017   New onset atrial fibrillation (HCC) 08/24/2015   Diuresis excessive 08/22/2015   Bladder outlet obstruction 08/22/2015   Hydronephrosis determined by ultrasound 08/22/2015   AKI (acute kidney injury) (HCC) 08/22/2015   Hypernatremia 08/22/2015   Acute blood loss anemia 08/22/2015   Hematuria 08/22/2015   History of total left knee replacement (TKR) 06/19/2015   Primary osteoarthritis of right knee 06/09/2015   Chronic pain of right knee 05/23/2015   Peripheral edema 08/31/2012   Urinary frequency 08/31/2012   Diverticulosis 03/23/2012   Impaired fasting glucose 02/06/2012   Decreased libido 03/07/2011   Erectile dysfunction 03/07/2011   Essential (primary) hypertension 12/20/2010   Mixed hyperlipidemia 12/20/2010   ALLERGIC RHINITIS, SEASONAL 05/02/2010   SCIATICA 05/02/2010   History of cellulitis 05/02/2010    Orientation RESPIRATION BLADDER Height & Weight     Self, Time, Situation, Place  Normal Continent Weight: 245 lb (111.1 kg) Height:  5\' 11"  (180.3 cm)  BEHAVIORAL SYMPTOMS/MOOD NEUROLOGICAL BOWEL NUTRITION STATUS      Continent Diet (see dc summary)  AMBULATORY STATUS COMMUNICATION OF NEEDS Skin   Extensive Assist Verbally Normal                       Personal Care Assistance Level of Assistance  Bathing, Feeding, Dressing Bathing Assistance: Limited assistance Feeding assistance: Independent Dressing Assistance: Limited assistance      Functional Limitations Info  Sight, Hearing, Speech Sight Info: Adequate Hearing Info: Impaired Speech Info: Adequate    SPECIAL CARE FACTORS FREQUENCY  PT (By licensed PT), OT (By licensed OT)     PT Frequency: 5x week OT Frequency: 5x week            Contractures Contractures Info: Not present    Additional Factors Info  Code Status, Allergies Code Status Info: Full Allergies Info: Diovan, Tagamet           Current Medications (08/22/2023):  This is the current hospital active medication list Current Facility-Administered Medications  Medication Dose Route Frequency Provider Last Rate Last Admin   acetaminophen (TYLENOL) tablet 650 mg  650 mg Oral Q6H PRN Maurilio Lovely D, DO   650 mg at 08/21/23 2159   Or   acetaminophen (TYLENOL) suppository 650 mg  650 mg Rectal Q6H PRN Sherryll Burger, Pratik D, DO       apixaban (ELIQUIS) tablet 5 mg  5 mg Oral BID Sherryll Burger, Pratik D, DO   5 mg at 08/22/23 0849   diltiazem (CARDIZEM CD) 24 hr capsule 120 mg  120 mg Oral Daily Maurilio Lovely D, DO   120 mg at 08/22/23 0849   dutasteride (AVODART) capsule 0.5 mg  0.5 mg Oral Daily Maurilio Lovely D, DO   0.5 mg at 08/22/23 1129   insulin aspart (novoLOG) injection 0-9 Units  0-9 Units Subcutaneous TID WC Sherryll Burger, Pratik  D, DO   1 Units at 08/21/23 1726   losartan (COZAAR) tablet 50 mg  50 mg Oral Daily Sherryll Burger, Pratik D, DO   50 mg at 08/22/23 0848   metFORMIN (GLUCOPHAGE) tablet 500 mg  500 mg Oral BID WC Sherryll Burger, Pratik D, DO   500 mg at 08/22/23 0848   metoprolol tartrate (LOPRESSOR) tablet 50 mg  50 mg Oral BID Sherryll Burger, Pratik D, DO   50 mg at 08/22/23 0848   ondansetron (ZOFRAN) tablet 4 mg  4 mg Oral Q6H PRN Sherryll Burger, Pratik D, DO       Or   ondansetron [facility]) injection 4 mg  4 mg Intravenous Q6H PRN Maurilio Lovely D, DO       Oral care mouth rinse  15 mL Mouth Rinse PRN Sherryll Burger, Pratik D, DO       oxybutynin (DITROPAN) tablet 5 mg  5 mg Oral QPM Shah, Pratik D, DO   5 mg at 08/21/23 1725   pantoprazole (PROTONIX)  EC tablet 40 mg  40 mg Oral Daily Sherryll Burger, Pratik D, DO   40 mg at 08/22/23 0848   rosuvastatin (CRESTOR) tablet 20 mg  20 mg Oral Daily Sherryll Burger, Pratik D, DO   20 mg at 08/22/23 1610     Discharge Medications: Please see discharge summary for a list of discharge medications.  Relevant Imaging Results:  Relevant Lab Results:   Additional Information SSN: [id number] [place]

## 2023-08-22 NOTE — Evaluation (Signed)
 Physical Therapy Evaluation Patient Details Name: Gary Carroll MRN: [id number] DOB: 07/07/1945 Today's Date: 08/22/2023  History of Present Illness  Gary Carroll is a 78 y.o. male with medical history significant for severe AS status post TAVR, PAF on Eliquis, CAD, rectal adenocarcinoma, hypertension, dyslipidemia, type 2 diabetes, and chronic HFpEF who presented to the ED with significant weakness over the last several days and this has been progressively worsening over the last 1-2 weeks.  He lives by himself and at baseline has difficulty ambulating and uses a walker, but states now that he is not able to get out of bed and has poor appetite.  He states he was having low-grade fevers at home as well but denies any cough or shortness of breath or chest pain.  He has otherwise been taking his home medications and followed up with his oncologist at the [place] last week.  He states that he has been too weak to tolerate chemotherapy and is no longer candidate for any surgical measures given his cancer.    Clinical Impression  Patient tolerated session well. On this date, patient required mod A for bed mobility, min-CGA for functional transfer and ambulation with a RW. Patient reports at baseline he is independent with all ADLs and uses a RW around his home but recently started to struggle with getting into and out of bed and felt generally weaker. Total assist was required for pericare in bed on this date. Patient will benefit from continued skilled physical therapy acutely and in SNF setting in order to address the above/below deficits to return home safely and improve QOL.       If plan is discharge home, recommend the following: A lot of help with walking and/or transfers;A lot of help with bathing/dressing/bathroom   Can travel by private vehicle   Yes    Equipment Recommendations None recommended by PT  Recommendations for Other Services       Functional Status Assessment Patient  has had a recent decline in their functional status and demonstrates the ability to make significant improvements in function in a reasonable and predictable amount of time.     Precautions / Restrictions Precautions Precautions: Fall Recall of Precautions/Restrictions: Intact Restrictions Weight Bearing Restrictions Per Provider Order: No      Mobility  Bed Mobility Overal bed mobility: Needs Assistance Bed Mobility: Rolling, Sidelying to Sit Rolling: Mod assist, Min assist Sidelying to sit: Mod assist       General bed mobility comments: HOB flat as in home. Use of railing, slow labored movement. Mod A needed to complete 2/2 weakness    Transfers Overall transfer level: Needs assistance Equipment used: Rolling walker (2 wheels) Transfers: Sit to/from Stand, Bed to chair/wheelchair/BSC Sit to Stand: Min assist   Step pivot transfers: Contact guard assist, Min assist       General transfer comment: STS from bed min A. Min A needed for step pivot tfs for steadyign intially, progressing to CGA.    Ambulation/Gait Ambulation/Gait assistance: Contact guard assist Gait Distance (Feet): 5 Feet Assistive device: Rolling walker (2 wheels) Gait Pattern/deviations: Decreased step length - right, Decreased step length - left, Trunk flexed Gait velocity: Decreased     General Gait Details: Pt ambulates forward/backward ~5 ft with RW and CGA, demo the above deficits and dec foot clearance. Slow, labored movement.  Stairs      Wheelchair Mobility     Tilt Bed    Modified Rankin (Stroke Patients Only)  Balance Overall balance assessment: Needs assistance Sitting-balance support: No upper extremity supported, Feet supported Sitting balance-Leahy Scale: Good Sitting balance - Comments: seated at EOB   Standing balance support: Bilateral upper extremity supported, During functional activity, Reliant on assistive device for balance Standing balance-Leahy Scale:  Fair Standing balance comment: using RW             Pertinent Vitals/Pain Pain Assessment Pain Assessment: No/denies pain    Home Living Family/patient expects to be discharged to:: Private residence Living Arrangements: Alone Available Help at Discharge: Friend(s);Available PRN/intermittently Type of Home: Apartment Home Access: Stairs to enter Entrance Stairs-Rails: Right;Left Entrance Stairs-Number of Steps: 4   Home Layout: One level Home Equipment: Agricultural consultant (2 wheels);BSC/3in1;Shower seat      Prior Function Prior Level of Function : Independent/Modified Independent             Mobility Comments: [place] ambulator with RW ADLs Comments: Indepdnent ADL; fearful of shower transfer at home.     Extremity/Trunk Assessment   Upper Extremity Assessment Upper Extremity Assessment: Defer to OT evaluation    Lower Extremity Assessment Lower Extremity Assessment: Generalized weakness (Generally weak in LE. Mild edema in feet present bilaterally.)    Cervical / Trunk Assessment Cervical / Trunk Assessment: Normal  Communication   Communication Communication: No apparent difficulties    Cognition Arousal: Alert Behavior During Therapy: WFL for tasks assessed/performed   PT - Cognitive impairments: No apparent impairments     Following commands: Intact       Cueing Cueing Techniques: Verbal cues     General Comments      Exercises     Assessment/Plan    PT Assessment Patient needs continued PT services;All further PT needs can be met in the next venue of care  PT Problem List Decreased strength;Decreased range of motion;Decreased activity tolerance;Decreased balance;Decreased mobility       PT Treatment Interventions Gait training;DME instruction;Stair training;Functional mobility training;Therapeutic activities;Therapeutic exercise;Balance training;Patient/family education    PT Goals (Current goals can be found in the Care Plan section)   Acute Rehab PT Goals Patient Stated Goal: Return home after short SNF Rehab PT Goal Formulation: With patient Time For Goal Achievement: 08/22/23 Potential to Achieve Goals: Good    Frequency Min 3X/week     Co-evaluation PT/OT/SLP Co-Evaluation/Treatment: Yes Reason for Co-Treatment: To address functional/ADL transfers PT goals addressed during session: Mobility/safety with mobility OT goals addressed during session: ADL's and self-care       AM-PAC PT "6 Clicks" Mobility  Outcome Measure Help needed turning from your back to your side while in a flat bed without using bedrails?: A Lot Help needed moving from lying on your back to sitting on the side of a flat bed without using bedrails?: A Lot Help needed moving to and from a bed to a chair (including a wheelchair)?: A Little Help needed standing up from a chair using your arms (e.g., wheelchair or bedside chair)?: A Little Help needed to walk in hospital room?: A Little Help needed climbing 3-5 steps with a railing? : A Lot 6 Click Score: 15    End of Session Equipment Utilized During Treatment: Gait belt Activity Tolerance: Patient tolerated treatment well;Patient limited by fatigue Patient left: in chair;with call bell/phone within reach Nurse Communication: Mobility status PT Visit Diagnosis: Unsteadiness on feet (R26.81);Difficulty in walking, not elsewhere classified (R26.2);Other abnormalities of gait and mobility (R26.89);Muscle weakness (generalized) (M62.81)    Time: 4259-5638 PT Time Calculation (min) (ACUTE ONLY): 25 min  Charges:   PT Evaluation $PT Eval Moderate Complexity: 1 Mod PT Treatments $Gait Training: 8-22 mins $Therapeutic Activity: 8-22 mins PT General Charges $$ ACUTE PT VISIT: 1 Visit         1:43 PM, 08/22/23 Jack Mineau Powell-Butler, PT, DPT [place] with [facility]

## 2023-08-22 NOTE — Plan of Care (Signed)
  Problem: Acute Rehab OT Goals (only OT should resolve) Goal: Pt. Will Perform Grooming Flowsheets (Taken 08/22/2023 1116) Pt Will Perform Grooming:  with modified independence  standing Goal: Pt. Will Perform Lower Body Bathing Flowsheets (Taken 08/22/2023 1116) Pt Will Perform Lower Body Bathing: with modified independence Goal: Pt. Will Perform Lower Body Dressing Flowsheets (Taken 08/22/2023 1116) Pt Will Perform Lower Body Dressing: with modified independence Goal: Pt. Will Transfer To Toilet Flowsheets (Taken 08/22/2023 1116) Pt Will Transfer to Toilet:  with modified independence  stand pivot transfer  ambulating Goal: Pt. Will Perform Toileting-Clothing Manipulation Flowsheets (Taken 08/22/2023 1116) Pt Will Perform Toileting - Clothing Manipulation and hygiene: with modified independence Goal: Pt/Caregiver Will Perform Home Exercise Program Flowsheets (Taken 08/22/2023 1116) Pt/caregiver will Perform Home Exercise Program:  Increased strength  Both right and left upper extremity  Independently  Lankford Gutzmer OT, MOT

## 2023-08-22 NOTE — Plan of Care (Signed)
  Problem: Acute Rehab PT Goals(only PT should resolve) Goal: Pt will Roll Supine to Side Outcome: Progressing Flowsheets (Taken 08/22/2023 1346) Pt will Roll Supine to Side: Independently Goal: Pt Will Go Supine/Side To Sit Outcome: Progressing Flowsheets (Taken 08/22/2023 1346) Pt will go Supine/Side to Sit: Independently Goal: Patient Will Transfer Sit To/From Stand Outcome: Progressing Flowsheets (Taken 08/22/2023 1346) Patient will transfer sit to/from stand: Independently Goal: Pt Will Transfer Bed To Chair/Chair To Bed Outcome: Progressing Flowsheets (Taken 08/22/2023 1346) Pt will Transfer Bed to Chair/Chair to Bed: with modified independence Goal: Pt Will Ambulate Outcome: Progressing Flowsheets (Taken 08/22/2023 1346) Pt will Ambulate:  25 feet  with rolling walker  with supervision    1:47 PM, 08/22/23 Chryl Heck, PT, DPT [place] with [facility]

## 2023-08-23 DIAGNOSIS — U071 COVID-19: Secondary | ICD-10-CM | POA: Diagnosis not present

## 2023-08-23 LAB — BASIC METABOLIC PANEL WITH GFR
Anion gap: 9 (ref 5–15)
BUN: 14 mg/dL (ref 8–23)
CO2: 22 mmol/L (ref 22–32)
Calcium: 8.6 mg/dL — ABNORMAL LOW (ref 8.9–10.3)
Chloride: 103 mmol/L (ref 98–111)
Creatinine, Ser: 0.71 mg/dL (ref 0.61–1.24)
GFR, Estimated: >60 mL/min (ref >60)
Glucose, Bld: 111 mg/dL — ABNORMAL HIGH (ref 70–99)
Potassium: 3.8 mmol/L (ref 3.5–5.1)
Sodium: 134 mmol/L — ABNORMAL LOW (ref 135–145)

## 2023-08-23 LAB — GLUCOSE, CAPILLARY
Glucose-Capillary: 126 mg/dL — ABNORMAL HIGH (ref 70–99)
Glucose-Capillary: 111 mg/dL — ABNORMAL HIGH (ref 70–99)
Glucose-Capillary: 119 mg/dL — ABNORMAL HIGH (ref 70–99)
Glucose-Capillary: 111 mg/dL — ABNORMAL HIGH (ref 70–99)

## 2023-08-23 NOTE — Plan of Care (Signed)
   Problem: Education: Goal: Knowledge of risk factors and measures for prevention of condition will improve Outcome: Progressing   Problem: Coping: Goal: Psychosocial and spiritual needs will be supported Outcome: Progressing

## 2023-08-23 NOTE — Progress Notes (Signed)
 PROGRESS NOTE    Gary Carroll  ZOX:[id number] DOB: 1946/05/06 DOA: 08/21/2023 PCP: Benita Stabile, MD   Brief Narrative:    Gary Carroll is a 78 y.o. male with medical history significant for severe AS status post TAVR, PAF on Eliquis, CAD, rectal adenocarcinoma, hypertension, dyslipidemia, type 2 diabetes, and chronic HFpEF who presented to the ED with significant weakness over the last several days and this has been progressively worsening over the last 1-2 weeks.  He is noted to have COVID-19 infection and is symptomatic from this.  PT recommending SNF.  Patient currently medically stable and awaiting SNF placement.  Assessment & Plan:   Principal Problem:   COVID-19 Active Problems:   Essential (primary) hypertension   Mixed hyperlipidemia   Gastroesophageal reflux disease without esophagitis   AF (paroxysmal atrial fibrillation) (HCC)   Chronic anticoagulation   Aortic stenosis   Obstructive sleep apnea (adult) (pediatric)   (HFpEF) heart failure with preserved ejection fraction (HCC)   Primary adenocarcinoma of colon (HCC)   Morbid obesity (HCC)  Assessment and Plan:   Symptomatic COVID-19 infection with possible failure to thrive -No current need for treatment with no findings on chest x-ray or any significant hypoxemia -Given significant weakness, PT has assessed and is now recommending SNF.  Currently awaiting placement.   PAF/flutter -Currently stable on telemetry -Continue home diltiazem and metoprolol -Eliquis for anticoagulation  Mild hyponatremia -Continue to monitor   OSA -On CPAP   Hypertension -Continue home medications   Dyslipidemia -Continue statin   Chronic HFpEF -Currently appears euvolemic and given IV fluid bolus in ED -Plan to resume Lasix if tolerating diet   Type 2 diabetes -Continue metformin -Carb diet -SSI     Rectal adenocarcinoma -Follows with oncologist at [facility] and currently not on any further chemotherapy or  radiation -Apparently not a candidate for surgery -Overall prognosis appears poor -Appreciate palliative consultation for goals of care discussion   History of severe AS status post TAVR -Successful TAVR with 26 mm Edwards SAPIEN 3 THV on 02/25/2023   Obesity, class I -BMI 34.17    DVT prophylaxis:apixaban Code Status: Full Family Communication: None at bedside Disposition Plan:  Status is: Inpatient Remains inpatient appropriate because: Need for SNF placement.   Consultants:  Palliative care  Procedures:  None  Antimicrobials:  None   Subjective: Patient seen and evaluated today with no new acute complaints or concerns. No acute concerns or events noted overnight.  He is currently awaiting placement.  Objective: Vitals:   08/22/23 1421 08/22/23 2053 08/23/23 0447 08/23/23 0503  BP: 109/76 (!) 155/76 (!) 103/51 (!) 163/85  Pulse: 87 (!) 103 79 (!) 102  Resp: 16 20 18 19   Temp: 99 F (37.2 C) 97.9 F (36.6 C) 98.9 F (37.2 C) 97.8 F (36.6 C)  TempSrc: Oral Oral Oral Oral  SpO2: 94% 96% 97% 95%  Weight:      Height:        Intake/Output Summary (Last 24 hours) at 08/23/2023 1413 Last data filed at 08/23/2023 0500 Gross per 24 hour  Intake 240 ml  Output 1350 ml  Net -1110 ml   Filed Weights   08/21/23 0710  Weight: 111.1 kg    Examination:  General exam: Appears calm and comfortable  Respiratory system: Clear to auscultation. Respiratory effort normal. Cardiovascular system: S1 & S2 heard, RRR.  Gastrointestinal system: Abdomen is soft Central nervous system: Alert and awake Extremities: No edema Skin: No significant lesions noted Psychiatry: Flat  affect.    Data Reviewed: I have personally reviewed following labs and imaging studies  CBC: Recent Labs  Lab 08/21/23 0710 08/22/23 0458  WBC 5.3 5.5  NEUTROABS 4.0  --   HGB 12.4* 12.8*  HCT 38.5* 39.6  MCV 90.2 89.8  PLT 126* 115*   Basic Metabolic Panel: Recent Labs  Lab  08/21/23 0710 08/22/23 0458 08/23/23 0545  NA 135 132* 134*  K 4.2 3.9 3.8  CL 102 101 103  CO2 24 23 22   GLUCOSE 122* 116* 111*  BUN 15 16 14   CREATININE 0.74 0.79 0.71  CALCIUM 9.4 8.2* 8.6*  MG 1.9 1.7  --    GFR: Estimated Creatinine Clearance: 96.4 mL/min (by C-G formula based on SCr of 0.71 mg/dL). Liver Function Tests: Recent Labs  Lab 08/21/23 0710  AST 35  ALT 34  ALKPHOS 61  BILITOT 0.4  PROT 6.7  ALBUMIN 2.9*   Recent Labs  Lab 08/21/23 0710  LIPASE 26   No results for input(s): "AMMONIA" in the last 168 hours. Coagulation Profile: No results for input(s): "INR", "PROTIME" in the last 168 hours. Cardiac Enzymes: No results for input(s): "CKTOTAL", "CKMB", "CKMBINDEX", "TROPONINI" in the last 168 hours. BNP (last 3 results) No results for input(s): "PROBNP" in the last 8760 hours. HbA1C: Recent Labs    08/21/23 0710  HGBA1C 6.1*   CBG: Recent Labs  Lab 08/22/23 1108 08/22/23 1635 08/22/23 2049 08/23/23 0720 08/23/23 1152  GLUCAP 119* 113* 134* 126* 111*   Lipid Profile: No results for input(s): "CHOL", "HDL", "LDLCALC", "TRIG", "CHOLHDL", "LDLDIRECT" in the last 72 hours. Thyroid Function Tests: Recent Labs    08/21/23 0951  TSH 1.656   Anemia Panel: No results for input(s): "VITAMINB12", "FOLATE", "FERRITIN", "TIBC", "IRON", "RETICCTPCT" in the last 72 hours. Sepsis Labs: No results for input(s): "PROCALCITON", "LATICACIDVEN" in the last 168 hours.  Recent Results (from the past 240 hours)  Resp panel by RT-PCR (RSV, Flu A&B, Covid) Anterior Nasal Swab     Status: Abnormal   Collection Time: 08/21/23  7:44 AM   Specimen: Anterior Nasal Swab  Result Value Ref Range Status   SARS Coronavirus 2 by RT PCR POSITIVE (A) NEGATIVE Final    Comment: (NOTE) SARS-CoV-2 target nucleic acids are DETECTED.  The SARS-CoV-2 RNA is generally detectable in upper respiratory specimens during the acute phase of infection. Positive results  are indicative of the presence of the identified virus, but do not rule out bacterial infection or co-infection with other pathogens not detected by the test. Clinical correlation with patient history and other diagnostic information is necessary to determine patient infection status. The expected result is Negative.  Fact Sheet for Patients: BloggerCourse.com  Fact Sheet for Healthcare Providers: SeriousBroker.it  This test is not yet approved or cleared by the [place] FDA and  has been authorized for detection and/or diagnosis of SARS-CoV-2 by FDA under an Emergency Use Authorization (EUA).  This EUA will remain in effect (meaning this test can be used) for the duration of  the COVID-19 declaration under Section 564(b)(1) of the A ct, 21 U.S.C. section 360bbb-3(b)(1), unless the authorization is terminated or revoked sooner.     Influenza A by PCR NEGATIVE NEGATIVE Final   Influenza B by PCR NEGATIVE NEGATIVE Final    Comment: (NOTE) The Xpert Xpress SARS-CoV-2/FLU/RSV plus assay is intended as an aid in the diagnosis of influenza from Nasopharyngeal swab specimens and should not be used as a sole basis for treatment.  Nasal washings and aspirates are unacceptable for Xpert Xpress SARS-CoV-2/FLU/RSV testing.  Fact Sheet for Patients: BloggerCourse.com  Fact Sheet for Healthcare Providers: SeriousBroker.it  This test is not yet approved or cleared by the [place] FDA and has been authorized for detection and/or diagnosis of SARS-CoV-2 by FDA under an Emergency Use Authorization (EUA). This EUA will remain in effect (meaning this test can be used) for the duration of the COVID-19 declaration under Section 564(b)(1) of the Act, 21 U.S.C. section 360bbb-3(b)(1), unless the authorization is terminated or revoked.     Resp Syncytial Virus by PCR NEGATIVE NEGATIVE Final     Comment: (NOTE) Fact Sheet for Patients: BloggerCourse.com  Fact Sheet for Healthcare Providers: SeriousBroker.it  This test is not yet approved or cleared by the [place] FDA and has been authorized for detection and/or diagnosis of SARS-CoV-2 by FDA under an Emergency Use Authorization (EUA). This EUA will remain in effect (meaning this test can be used) for the duration of the COVID-19 declaration under Section 564(b)(1) of the Act, 21 U.S.C. section 360bbb-3(b)(1), unless the authorization is terminated or revoked.  Performed at [facility], [address], [place], [place] [address]          Radiology Studies: No results found.       Scheduled Meds:  apixaban  5 mg Oral BID   diltiazem  120 mg Oral Daily   dutasteride  0.5 mg Oral Daily   insulin aspart  0-9 Units Subcutaneous TID WC   losartan  50 mg Oral Daily   metFORMIN  500 mg Oral BID WC   metoprolol tartrate  50 mg Oral BID   oxybutynin  5 mg Oral QPM   pantoprazole  40 mg Oral Daily   rosuvastatin  20 mg Oral Daily     LOS: 1 day    Time spent: 35 minutes    Shanae Luo Hoover Brunette, DO [facility]  If 7PM-7AM, please contact night-coverage [email or web address] 08/23/2023, 2:13 PM

## 2023-08-24 DIAGNOSIS — U071 COVID-19: Secondary | ICD-10-CM | POA: Diagnosis not present

## 2023-08-24 LAB — GLUCOSE, CAPILLARY
Glucose-Capillary: 110 mg/dL — ABNORMAL HIGH (ref 70–99)
Glucose-Capillary: 103 mg/dL — ABNORMAL HIGH (ref 70–99)
Glucose-Capillary: 97 mg/dL (ref 70–99)
Glucose-Capillary: 117 mg/dL — ABNORMAL HIGH (ref 70–99)

## 2023-08-24 MED ORDER — ENSURE ENLIVE PO LIQD: 237.0000 mL | Freq: Two times a day (BID) | ORAL | Status: DC

## 2023-08-24 NOTE — Progress Notes (Signed)
 Dr. Thomes Dinning made aware that patient is refusing for his IVL to be restarted. Patient is currently not receiving any medications intravenously.

## 2023-08-24 NOTE — Plan of Care (Signed)
 Rested well during the night. No complaints. Not requiring any oxygen.  Problem: Education: Goal: Knowledge of risk factors and measures for prevention of condition will improve Outcome: Progressing   Problem: Coping: Goal: Psychosocial and spiritual needs will be supported Outcome: Progressing   Problem: Respiratory: Goal: Will maintain a patent airway Outcome: Progressing Goal: Complications related to the disease process, condition or treatment will be avoided or minimized Outcome: Progressing   Problem: Education: Goal: Knowledge of General Education information will improve Description: Including pain rating scale, medication(s)/side effects and non-pharmacologic comfort measures Outcome: Progressing   Problem: Health Behavior/Discharge Planning: Goal: Ability to manage health-related needs will improve Outcome: Progressing   Problem: Clinical Measurements: Goal: Ability to maintain clinical measurements within normal limits will improve Outcome: Progressing Goal: Will remain free from infection Outcome: Progressing Goal: Diagnostic test results will improve Outcome: Progressing Goal: Respiratory complications will improve Outcome: Progressing Goal: Cardiovascular complication will be avoided Outcome: Progressing   Problem: Activity: Goal: Risk for activity intolerance will decrease Outcome: Progressing   Problem: Nutrition: Goal: Adequate nutrition will be maintained Outcome: Progressing   Problem: Coping: Goal: Level of anxiety will decrease Outcome: Progressing   Problem: Elimination: Goal: Will not experience complications related to bowel motility Outcome: Progressing Goal: Will not experience complications related to urinary retention Outcome: Progressing   Problem: Pain Managment: Goal: General experience of comfort will improve and/or be controlled Outcome: Progressing   Problem: Safety: Goal: Ability to remain free from injury will  improve Outcome: Progressing   Problem: Skin Integrity: Goal: Risk for impaired skin integrity will decrease Outcome: Progressing   Problem: Education: Goal: Ability to describe self-care measures that may prevent or decrease complications (Diabetes Survival Skills Education) will improve Outcome: Progressing Goal: Individualized Educational Video(s) Outcome: Progressing   Problem: Coping: Goal: Ability to adjust to condition or change in health will improve Outcome: Progressing   Problem: Fluid Volume: Goal: Ability to maintain a balanced intake and output will improve Outcome: Progressing   Problem: Health Behavior/Discharge Planning: Goal: Ability to identify and utilize available resources and services will improve Outcome: Progressing Goal: Ability to manage health-related needs will improve Outcome: Progressing   Problem: Metabolic: Goal: Ability to maintain appropriate glucose levels will improve Outcome: Progressing   Problem: Nutritional: Goal: Maintenance of adequate nutrition will improve Outcome: Progressing Goal: Progress toward achieving an optimal weight will improve Outcome: Progressing   Problem: Skin Integrity: Goal: Risk for impaired skin integrity will decrease Outcome: Progressing   Problem: Tissue Perfusion: Goal: Adequacy of tissue perfusion will improve Outcome: Progressing

## 2023-08-24 NOTE — Plan of Care (Signed)
 Rested well during the shift. No complaints. No oxygen requirement.  Problem: Education: Goal: Knowledge of risk factors and measures for prevention of condition will improve Outcome: Progressing   Problem: Coping: Goal: Psychosocial and spiritual needs will be supported Outcome: Progressing   Problem: Respiratory: Goal: Will maintain a patent airway Outcome: Progressing Goal: Complications related to the disease process, condition or treatment will be avoided or minimized Outcome: Progressing   Problem: Education: Goal: Knowledge of General Education information will improve Description: Including pain rating scale, medication(s)/side effects and non-pharmacologic comfort measures Outcome: Progressing   Problem: Health Behavior/Discharge Planning: Goal: Ability to manage health-related needs will improve Outcome: Progressing   Problem: Clinical Measurements: Goal: Ability to maintain clinical measurements within normal limits will improve Outcome: Progressing Goal: Will remain free from infection Outcome: Progressing Goal: Diagnostic test results will improve Outcome: Progressing Goal: Respiratory complications will improve Outcome: Progressing Goal: Cardiovascular complication will be avoided Outcome: Progressing   Problem: Activity: Goal: Risk for activity intolerance will decrease Outcome: Progressing   Problem: Nutrition: Goal: Adequate nutrition will be maintained Outcome: Progressing   Problem: Coping: Goal: Level of anxiety will decrease Outcome: Progressing   Problem: Elimination: Goal: Will not experience complications related to bowel motility Outcome: Progressing Goal: Will not experience complications related to urinary retention Outcome: Progressing   Problem: Pain Managment: Goal: General experience of comfort will improve and/or be controlled Outcome: Progressing   Problem: Safety: Goal: Ability to remain free from injury will  improve Outcome: Progressing   Problem: Skin Integrity: Goal: Risk for impaired skin integrity will decrease Outcome: Progressing   Problem: Education: Goal: Ability to describe self-care measures that may prevent or decrease complications (Diabetes Survival Skills Education) will improve Outcome: Progressing Goal: Individualized Educational Video(s) Outcome: Progressing   Problem: Coping: Goal: Ability to adjust to condition or change in health will improve Outcome: Progressing   Problem: Fluid Volume: Goal: Ability to maintain a balanced intake and output will improve Outcome: Progressing   Problem: Health Behavior/Discharge Planning: Goal: Ability to identify and utilize available resources and services will improve Outcome: Progressing Goal: Ability to manage health-related needs will improve Outcome: Progressing   Problem: Metabolic: Goal: Ability to maintain appropriate glucose levels will improve Outcome: Progressing   Problem: Nutritional: Goal: Maintenance of adequate nutrition will improve Outcome: Progressing Goal: Progress toward achieving an optimal weight will improve Outcome: Progressing   Problem: Skin Integrity: Goal: Risk for impaired skin integrity will decrease Outcome: Progressing   Problem: Tissue Perfusion: Goal: Adequacy of tissue perfusion will improve Outcome: Progressing

## 2023-08-24 NOTE — Progress Notes (Signed)
 PROGRESS NOTE    Gary Carroll  ZDG:[id number] DOB: 1946-03-13 DOA: 08/21/2023 PCP: Benita Stabile, MD   Brief Narrative:    Gary Carroll is a 78 y.o. male with medical history significant for severe AS status post TAVR, PAF on Eliquis, CAD, rectal adenocarcinoma, hypertension, dyslipidemia, type 2 diabetes, and chronic HFpEF who presented to the ED with significant weakness over the last several days and this has been progressively worsening over the last 1-2 weeks.  He is noted to have COVID-19 infection and is symptomatic from this.  PT recommending SNF.  Patient currently medically stable and awaiting SNF placement.  Assessment & Plan:   Principal Problem:   COVID-19 Active Problems:   Essential (primary) hypertension   Mixed hyperlipidemia   Gastroesophageal reflux disease without esophagitis   AF (paroxysmal atrial fibrillation) (HCC)   Chronic anticoagulation   Aortic stenosis   Obstructive sleep apnea (adult) (pediatric)   (HFpEF) heart failure with preserved ejection fraction (HCC)   Primary adenocarcinoma of colon (HCC)   Morbid obesity (HCC)  Assessment and Plan:   Symptomatic COVID-19 infection with possible failure to thrive -No current need for treatment with no findings on chest x-ray or any significant hypoxemia -Given significant weakness, PT has assessed and is now recommending SNF.  Currently awaiting placement.   PAF/flutter -Currently stable on telemetry -Continue home diltiazem and metoprolol -Eliquis for anticoagulation  Mild hyponatremia -Continue to monitor   OSA -On CPAP   Hypertension -Continue home medications   Dyslipidemia -Continue statin   Chronic HFpEF -Currently appears euvolemic and given IV fluid bolus in ED -Plan to resume Lasix if tolerating diet   Type 2 diabetes -Continue metformin -Carb diet -SSI     Rectal adenocarcinoma -Follows with oncologist at [facility] and currently not on any further chemotherapy or  radiation -Apparently not a candidate for surgery -Overall prognosis appears poor -Appreciate palliative consultation for goals of care discussion   History of severe AS status post TAVR -Successful TAVR with 26 mm Edwards SAPIEN 3 THV on 02/25/2023   Obesity, class I -BMI 34.17    DVT prophylaxis:apixaban Code Status: Full Family Communication: None at bedside Disposition Plan:  Status is: Inpatient Remains inpatient appropriate because: Need for SNF placement.   Consultants:  Palliative care  Procedures:  None  Antimicrobials:  None   Subjective: Patient seen and evaluated today with no new acute complaints or concerns. No acute concerns or events noted overnight.  He is currently awaiting placement.  Objective: Vitals:   08/23/23 1413 08/23/23 2101 08/24/23 0509 08/24/23 0956  BP: (!) 160/79 (!) 145/76 121/84 (!) 128/98  Pulse: 100 (!) 105 (!) 102 74  Resp: 18 20 20    Temp: 97.9 F (36.6 C) 99.1 F (37.3 C) 98.8 F (37.1 C)   TempSrc: Oral Oral Oral   SpO2: 95% 91% 95%   Weight:      Height:        Intake/Output Summary (Last 24 hours) at 08/24/2023 1120 Last data filed at 08/24/2023 0500 Gross per 24 hour  Intake 240 ml  Output 1900 ml  Net -1660 ml   Filed Weights   08/21/23 0710  Weight: 111.1 kg    Examination:  General exam: Appears calm and comfortable  Respiratory system: Clear to auscultation. Respiratory effort normal. Cardiovascular system: S1 & S2 heard, RRR.  Gastrointestinal system: Abdomen is soft Central nervous system: Alert and awake Extremities: No edema Skin: No significant lesions noted Psychiatry: Flat affect.  Data Reviewed: I have personally reviewed following labs and imaging studies  CBC: Recent Labs  Lab 08/21/23 0710 08/22/23 0458  WBC 5.3 5.5  NEUTROABS 4.0  --   HGB 12.4* 12.8*  HCT 38.5* 39.6  MCV 90.2 89.8  PLT 126* 115*   Basic Metabolic Panel: Recent Labs  Lab 08/21/23 0710 08/22/23 0458  08/23/23 0545  NA 135 132* 134*  K 4.2 3.9 3.8  CL 102 101 103  CO2 24 23 22   GLUCOSE 122* 116* 111*  BUN 15 16 14   CREATININE 0.74 0.79 0.71  CALCIUM 9.4 8.2* 8.6*  MG 1.9 1.7  --    GFR: Estimated Creatinine Clearance: 96.4 mL/min (by C-G formula based on SCr of 0.71 mg/dL). Liver Function Tests: Recent Labs  Lab 08/21/23 0710  AST 35  ALT 34  ALKPHOS 61  BILITOT 0.4  PROT 6.7  ALBUMIN 2.9*   Recent Labs  Lab 08/21/23 0710  LIPASE 26   No results for input(s): "AMMONIA" in the last 168 hours. Coagulation Profile: No results for input(s): "INR", "PROTIME" in the last 168 hours. Cardiac Enzymes: No results for input(s): "CKTOTAL", "CKMB", "CKMBINDEX", "TROPONINI" in the last 168 hours. BNP (last 3 results) No results for input(s): "PROBNP" in the last 8760 hours. HbA1C: No results for input(s): "HGBA1C" in the last 72 hours.  CBG: Recent Labs  Lab 08/23/23 0720 08/23/23 1152 08/23/23 1630 08/23/23 2104 08/24/23 0738  GLUCAP 126* 111* 119* 111* 110*   Lipid Profile: No results for input(s): "CHOL", "HDL", "LDLCALC", "TRIG", "CHOLHDL", "LDLDIRECT" in the last 72 hours. Thyroid Function Tests: No results for input(s): "TSH", "T4TOTAL", "FREET4", "T3FREE", "THYROIDAB" in the last 72 hours.  Anemia Panel: No results for input(s): "VITAMINB12", "FOLATE", "FERRITIN", "TIBC", "IRON", "RETICCTPCT" in the last 72 hours. Sepsis Labs: No results for input(s): "PROCALCITON", "LATICACIDVEN" in the last 168 hours.  Recent Results (from the past 240 hours)  Resp panel by RT-PCR (RSV, Flu A&B, Covid) Anterior Nasal Swab     Status: Abnormal   Collection Time: 08/21/23  7:44 AM   Specimen: Anterior Nasal Swab  Result Value Ref Range Status   SARS Coronavirus 2 by RT PCR POSITIVE (A) NEGATIVE Final    Comment: (NOTE) SARS-CoV-2 target nucleic acids are DETECTED.  The SARS-CoV-2 RNA is generally detectable in upper respiratory specimens during the acute phase of  infection. Positive results are indicative of the presence of the identified virus, but do not rule out bacterial infection or co-infection with other pathogens not detected by the test. Clinical correlation with patient history and other diagnostic information is necessary to determine patient infection status. The expected result is Negative.  Fact Sheet for Patients: BloggerCourse.com  Fact Sheet for Healthcare Providers: SeriousBroker.it  This test is not yet approved or cleared by the [place] FDA and  has been authorized for detection and/or diagnosis of SARS-CoV-2 by FDA under an Emergency Use Authorization (EUA).  This EUA will remain in effect (meaning this test can be used) for the duration of  the COVID-19 declaration under Section 564(b)(1) of the A ct, 21 U.S.C. section 360bbb-3(b)(1), unless the authorization is terminated or revoked sooner.     Influenza A by PCR NEGATIVE NEGATIVE Final   Influenza B by PCR NEGATIVE NEGATIVE Final    Comment: (NOTE) The Xpert Xpress SARS-CoV-2/FLU/RSV plus assay is intended as an aid in the diagnosis of influenza from Nasopharyngeal swab specimens and should not be used as a sole basis for treatment. Nasal washings  and aspirates are unacceptable for Xpert Xpress SARS-CoV-2/FLU/RSV testing.  Fact Sheet for Patients: BloggerCourse.com  Fact Sheet for Healthcare Providers: SeriousBroker.it  This test is not yet approved or cleared by the [place] FDA and has been authorized for detection and/or diagnosis of SARS-CoV-2 by FDA under an Emergency Use Authorization (EUA). This EUA will remain in effect (meaning this test can be used) for the duration of the COVID-19 declaration under Section 564(b)(1) of the Act, 21 U.S.C. section 360bbb-3(b)(1), unless the authorization is terminated or revoked.     Resp Syncytial Virus by  PCR NEGATIVE NEGATIVE Final    Comment: (NOTE) Fact Sheet for Patients: BloggerCourse.com  Fact Sheet for Healthcare Providers: SeriousBroker.it  This test is not yet approved or cleared by the [place] FDA and has been authorized for detection and/or diagnosis of SARS-CoV-2 by FDA under an Emergency Use Authorization (EUA). This EUA will remain in effect (meaning this test can be used) for the duration of the COVID-19 declaration under Section 564(b)(1) of the Act, 21 U.S.C. section 360bbb-3(b)(1), unless the authorization is terminated or revoked.  Performed at [facility], [address] [place], [place] [address]          Radiology Studies: No results found.       Scheduled Meds:  apixaban  5 mg Oral BID   diltiazem  120 mg Oral Daily   dutasteride  0.5 mg Oral Daily   feeding supplement  237 mL Oral BID BM   insulin aspart  0-9 Units Subcutaneous TID WC   losartan  50 mg Oral Daily   metFORMIN  500 mg Oral BID WC   metoprolol tartrate  50 mg Oral BID   oxybutynin  5 mg Oral QPM   pantoprazole  40 mg Oral Daily   rosuvastatin  20 mg Oral Daily     LOS: 2 days    Time spent: 35 minutes    Peggyann Zwiefelhofer Hoover Brunette, DO [facility]  If 7PM-7AM, please contact night-coverage [email or web address] 08/24/2023, 11:20 AM

## 2023-08-25 DIAGNOSIS — U071 COVID-19: Secondary | ICD-10-CM | POA: Diagnosis not present

## 2023-08-25 LAB — BASIC METABOLIC PANEL WITH GFR
Anion gap: 10 (ref 5–15)
BUN: 19 mg/dL (ref 8–23)
CO2: 21 mmol/L — ABNORMAL LOW (ref 22–32)
Calcium: 8.9 mg/dL (ref 8.9–10.3)
Chloride: 104 mmol/L (ref 98–111)
Creatinine, Ser: 0.74 mg/dL (ref 0.61–1.24)
GFR, Estimated: >60 mL/min (ref >60)
Glucose, Bld: 102 mg/dL — ABNORMAL HIGH (ref 70–99)
Potassium: 3.8 mmol/L (ref 3.5–5.1)
Sodium: 135 mmol/L (ref 135–145)

## 2023-08-25 LAB — GLUCOSE, CAPILLARY
Glucose-Capillary: 96 mg/dL (ref 70–99)
Glucose-Capillary: 104 mg/dL — ABNORMAL HIGH (ref 70–99)
Glucose-Capillary: 109 mg/dL — ABNORMAL HIGH (ref 70–99)
Glucose-Capillary: 105 mg/dL — ABNORMAL HIGH (ref 70–99)

## 2023-08-25 LAB — MAGNESIUM: Magnesium: 1.8 mg/dL (ref 1.7–2.4)

## 2023-08-25 MED ORDER — METOPROLOL TARTRATE 50 MG PO TABS: 50.0000 mg | ORAL_TABLET | Freq: Two times a day (BID) | ORAL | 0 refills | Status: DC

## 2023-08-25 MED ORDER — PANTOPRAZOLE SODIUM 40 MG PO TBEC: 40.0000 mg | DELAYED_RELEASE_TABLET | Freq: Every day | ORAL | 0 refills | Status: DC | PRN

## 2023-08-25 NOTE — Progress Notes (Signed)
 Gave report to Cope, LPN at [facility].

## 2023-08-25 NOTE — Discharge Summary (Signed)
 Physician Discharge Summary  Gary Carroll MWN:[id number] DOB: 1946/03/14 DOA: 08/21/2023  PCP: Benita Stabile, MD  Admit date: 08/21/2023  Discharge date: 08/25/2023  Admitted From:Home  Disposition:  SNF  Recommendations for Outpatient Follow-up:  Follow up with PCP in 1-2 weeks Continue medications as prior  Home Health: None  Equipment/Devices: None  Discharge Condition:Stable  CODE STATUS: Full  Diet recommendation: Heart Healthy/Carb Modified  Brief/Interim Summary: Gary Carroll is a 78 y.o. male with medical history significant for severe AS status post TAVR, PAF on Eliquis, CAD, rectal adenocarcinoma, hypertension, dyslipidemia, type 2 diabetes, and chronic HFpEF who presented to the ED with significant weakness over the last several days and this has been progressively worsening over the last 1-2 weeks.  He is noted to have COVID-19 infection and is symptomatic from this.  PT recommending SNF.  He has awaited discharge for several days and is now noted to have a bed and may discharge to SNF.  No other acute events or concerns noted throughout the course of this admission.  Discharge Diagnoses:  Principal Problem:   COVID-19 Active Problems:   Essential (primary) hypertension   Mixed hyperlipidemia   Gastroesophageal reflux disease without esophagitis   AF (paroxysmal atrial fibrillation) (HCC)   Chronic anticoagulation   Aortic stenosis   Obstructive sleep apnea (adult) (pediatric)   (HFpEF) heart failure with preserved ejection fraction (HCC)   Primary adenocarcinoma of colon (HCC)   Morbid obesity (HCC)  Principal discharge diagnosis: Symptomatic COVID-19 infection with generalized weakness.  Discharge Instructions  Discharge Instructions     Diet - low sodium heart healthy   Complete by: As directed    Increase activity slowly   Complete by: As directed       Allergies as of 08/25/2023       Reactions   Diovan [valsartan] Cough   Tagamet  [cimetidine] Other (See Comments)   Irritability         Medication List     TAKE these medications    acetaminophen 650 MG CR tablet Commonly known as: TYLENOL Take 1,300 mg by mouth every 8 (eight) hours as needed for pain.   apixaban 5 MG Tabs tablet Commonly known as: ELIQUIS Take 1 tablet (5 mg total) by mouth 2 (two) times daily.   ascorbic acid 500 MG tablet Commonly known as: VITAMIN C Take 500 mg by mouth daily.   diltiazem 120 MG 24 hr capsule Commonly known as: Cardizem CD Take 1 capsule (120 mg total) by mouth daily.   dutasteride 0.5 MG capsule Commonly known as: AVODART Take 0.5 mg by mouth daily.   Fish Oil 1000 MG Caps Take 2,000 mg by mouth daily.   losartan 50 MG tablet Commonly known as: COZAAR Take 0.5 tablets (25 mg total) by mouth daily. What changed: how much to take   Magnesium 500 MG Caps Take 500 mg by mouth daily.   Melatonin 10 MG Caps Take 10 mg by mouth at bedtime.   metFORMIN 500 MG tablet Commonly known as: GLUCOPHAGE Take 500 mg by mouth 2 (two) times daily with a meal.   metoprolol tartrate 25 MG tablet Commonly known as: LOPRESSOR Take 25-50 mg by mouth 2 (two) times daily. 50 mg am 25 mg pm   metoprolol tartrate 50 MG tablet Commonly known as: LOPRESSOR Take 1 tablet (50 mg total) by mouth 2 (two) times daily.   Multivital tablet Take 1 tablet by mouth daily.   oxybutynin 5 MG tablet  Commonly known as: DITROPAN Take 5 mg by mouth every evening.   pantoprazole 40 MG tablet Commonly known as: PROTONIX Take 1 tablet (40 mg total) by mouth daily as needed (heartburn).   potassium chloride 10 MEQ tablet Commonly known as: KLOR-CON Take 1 tablet (10 mEq total) by mouth daily.   rosuvastatin 20 MG tablet Commonly known as: CRESTOR Take 20 mg by mouth daily.        Follow-up Information     Benita Stabile, MD. Schedule an appointment as soon as possible for a visit in 1 week(s).   Specialty: Internal  Medicine Contact information: [address] [place] [address] [phone]                Allergies  Allergen Reactions   Diovan [Valsartan] Cough   Tagamet [Cimetidine] Other (See Comments)    Irritability     Consultations: Palliative care   Procedures/Studies: DG Chest Port 1 View Result Date: 08/21/2023 CLINICAL DATA:  Weakness. EXAM: PORTABLE CHEST 1 VIEW COMPARISON:  03/14/2023 FINDINGS: Low lung volumes. Cardiopericardial silhouette is at upper limits of normal for size. Interstitial markings are diffusely coarsened with chronic features. The lungs are clear without focal pneumonia, edema, pneumothorax or pleural effusion. Hazy opacity overlying the left base is probably superimposition of soft tissues. No acute bony abnormality. Telemetry leads overlie the chest. IMPRESSION: Low volume film without acute cardiopulmonary findings. Electronically Signed   By: Kennith Center M.D.   On: 08/21/2023 07:53     Discharge Exam: Vitals:   08/25/23 0428 08/25/23 0914  BP: 130/72 121/73  Pulse: 94 (!) 102  Resp: 20   Temp: 98 F (36.7 C)   SpO2: 95% 92%   Vitals:   08/24/23 1338 08/24/23 2032 08/25/23 0428 08/25/23 0914  BP: 117/78 123/75 130/72 121/73  Pulse: 94 96 94 (!) 102  Resp: (!) 22 19 20    Temp: 99 F (37.2 C) 98.2 F (36.8 C) 98 F (36.7 C)   TempSrc: Oral Oral Oral   SpO2: 94% 95% 95% 92%  Weight:      Height:        General: Pt is alert, awake, not in acute distress Cardiovascular: RRR, S1/S2 +, no rubs, no gallops Respiratory: CTA bilaterally, no wheezing, no rhonchi Abdominal: Soft, NT, ND, bowel sounds + Extremities: no edema, no cyanosis    The results of significant diagnostics from this hospitalization (including imaging, microbiology, ancillary and laboratory) are listed below for reference.     Microbiology: Recent Results (from the past 240 hours)  Resp panel by RT-PCR (RSV, Flu A&B, Covid) Anterior Nasal Swab     Status:  Abnormal   Collection Time: 08/21/23  7:44 AM   Specimen: Anterior Nasal Swab  Result Value Ref Range Status   SARS Coronavirus 2 by RT PCR POSITIVE (A) NEGATIVE Final    Comment: (NOTE) SARS-CoV-2 target nucleic acids are DETECTED.  The SARS-CoV-2 RNA is generally detectable in upper respiratory specimens during the acute phase of infection. Positive results are indicative of the presence of the identified virus, but do not rule out bacterial infection or co-infection with other pathogens not detected by the test. Clinical correlation with patient history and other diagnostic information is necessary to determine patient infection status. The expected result is Negative.  Fact Sheet for Patients: BloggerCourse.com  Fact Sheet for Healthcare Providers: SeriousBroker.it  This test is not yet approved or cleared by the [place] and  has been authorized  for detection and/or diagnosis of SARS-CoV-2 by FDA under an Emergency Use Authorization (EUA).  This EUA will remain in effect (meaning this test can be used) for the duration of  the COVID-19 declaration under Section 564(b)(1) of the A ct, 21 U.S.C. section 360bbb-3(b)(1), unless the authorization is terminated or revoked sooner.     Influenza A by PCR NEGATIVE NEGATIVE Final   Influenza B by PCR NEGATIVE NEGATIVE Final    Comment: (NOTE) The Xpert Xpress SARS-CoV-2/FLU/RSV plus assay is intended as an aid in the diagnosis of influenza from Nasopharyngeal swab specimens and should not be used as a sole basis for treatment. Nasal washings and aspirates are unacceptable for Xpert Xpress SARS-CoV-2/FLU/RSV testing.  Fact Sheet for Patients: BloggerCourse.com  Fact Sheet for Healthcare Providers: SeriousBroker.it  This test is not yet approved or cleared by the [place] FDA and has been authorized for detection  and/or diagnosis of SARS-CoV-2 by FDA under an Emergency Use Authorization (EUA). This EUA will remain in effect (meaning this test can be used) for the duration of the COVID-19 declaration under Section 564(b)(1) of the Act, 21 U.S.C. section 360bbb-3(b)(1), unless the authorization is terminated or revoked.     Resp Syncytial Virus by PCR NEGATIVE NEGATIVE Final    Comment: (NOTE) Fact Sheet for Patients: BloggerCourse.com  Fact Sheet for Healthcare Providers: SeriousBroker.it  This test is not yet approved or cleared by the [place] FDA and has been authorized for detection and/or diagnosis of SARS-CoV-2 by FDA under an Emergency Use Authorization (EUA). This EUA will remain in effect (meaning this test can be used) for the duration of the COVID-19 declaration under Section 564(b)(1) of the Act, 21 U.S.C. section 360bbb-3(b)(1), unless the authorization is terminated or revoked.  Performed at [facility], [address] [place], [place] [address]      Labs: BNP (last 3 results) Recent Labs    12/20/22 2305 01/20/23 0655 01/21/23 0521  BNP 533.0* 417.0* 470.0*   Basic Metabolic Panel: Recent Labs  Lab 08/21/23 0710 08/22/23 0458 08/23/23 0545 08/25/23 0441  NA 135 132* 134* 135  K 4.2 3.9 3.8 3.8  CL 102 101 103 104  CO2 24 23 22  21*  GLUCOSE 122* 116* 111* 102*  BUN 15 16 14 19   CREATININE 0.74 0.79 0.71 0.74  CALCIUM 9.4 8.2* 8.6* 8.9  MG 1.9 1.7  --  1.8   Liver Function Tests: Recent Labs  Lab 08/21/23 0710  AST 35  ALT 34  ALKPHOS 61  BILITOT 0.4  PROT 6.7  ALBUMIN 2.9*   Recent Labs  Lab 08/21/23 0710  LIPASE 26   No results for input(s): "AMMONIA" in the last 168 hours. CBC: Recent Labs  Lab 08/21/23 0710 08/22/23 0458  WBC 5.3 5.5  NEUTROABS 4.0  --   HGB 12.4* 12.8*  HCT 38.5* 39.6  MCV 90.2 89.8  PLT 126* 115*   Cardiac Enzymes: No results for input(s): "CKTOTAL",  "CKMB", "CKMBINDEX", "TROPONINI" in the last 168 hours. BNP: Invalid input(s): "POCBNP" CBG: Recent Labs  Lab 08/24/23 1217 08/24/23 1622 08/24/23 2029 08/25/23 0755 08/25/23 1130  GLUCAP 103* 97 117* 96 104*   D-Dimer No results for input(s): "DDIMER" in the last 72 hours. Hgb A1c No results for input(s): "HGBA1C" in the last 72 hours. Lipid Profile No results for input(s): "CHOL", "HDL", "LDLCALC", "TRIG", "CHOLHDL", "LDLDIRECT" in the last 72 hours. Thyroid function studies No results for input(s): "TSH", "T4TOTAL", "T3FREE", "THYROIDAB" in the last 72 hours.  Invalid input(s): "FREET3" Anemia work up No results for input(s): "VITAMINB12", "FOLATE", "FERRITIN", "TIBC", "IRON", "RETICCTPCT" in the last 72 hours. Urinalysis    Component Value Date/Time   COLORURINE AMBER (A) 08/21/2023 1045   APPEARANCEUR CLOUDY (A) 08/21/2023 1045   LABSPEC 1.014 08/21/2023 1045   PHURINE 6.0 08/21/2023 1045   GLUCOSEU NEGATIVE 08/21/2023 1045   HGBUR NEGATIVE 08/21/2023 1045   BILIRUBINUR NEGATIVE 08/21/2023 1045   BILIRUBINUR neg 08/31/2012 1203   KETONESUR NEGATIVE 08/21/2023 1045   PROTEINUR NEGATIVE 08/21/2023 1045   UROBILINOGEN negative 08/31/2012 1203   UROBILINOGEN 0.2 05/31/2010 1125   NITRITE POSITIVE (A) 08/21/2023 1045   LEUKOCYTESUR LARGE (A) 08/21/2023 1045   Sepsis Labs Recent Labs  Lab 08/21/23 0710 08/22/23 0458  WBC 5.3 5.5   Microbiology Recent Results (from the past 240 hours)  Resp panel by RT-PCR (RSV, Flu A&B, Covid) Anterior Nasal Swab     Status: Abnormal   Collection Time: 08/21/23  7:44 AM   Specimen: Anterior Nasal Swab  Result Value Ref Range Status   SARS Coronavirus 2 by RT PCR POSITIVE (A) NEGATIVE Final    Comment: (NOTE) SARS-CoV-2 target nucleic acids are DETECTED.  The SARS-CoV-2 RNA is generally detectable in upper respiratory specimens during the acute phase of infection. Positive results are indicative of the presence of the  identified virus, but do not rule out bacterial infection or co-infection with other pathogens not detected by the test. Clinical correlation with patient history and other diagnostic information is necessary to determine patient infection status. The expected result is Negative.  Fact Sheet for Patients: BloggerCourse.com  Fact Sheet for Healthcare Providers: SeriousBroker.it  This test is not yet approved or cleared by the [place] FDA and  has been authorized for detection and/or diagnosis of SARS-CoV-2 by FDA under an Emergency Use Authorization (EUA).  This EUA will remain in effect (meaning this test can be used) for the duration of  the COVID-19 declaration under Section 564(b)(1) of the A ct, 21 U.S.C. section 360bbb-3(b)(1), unless the authorization is terminated or revoked sooner.     Influenza A by PCR NEGATIVE NEGATIVE Final   Influenza B by PCR NEGATIVE NEGATIVE Final    Comment: (NOTE) The Xpert Xpress SARS-CoV-2/FLU/RSV plus assay is intended as an aid in the diagnosis of influenza from Nasopharyngeal swab specimens and should not be used as a sole basis for treatment. Nasal washings and aspirates are unacceptable for Xpert Xpress SARS-CoV-2/FLU/RSV testing.  Fact Sheet for Patients: BloggerCourse.com  Fact Sheet for Healthcare Providers: SeriousBroker.it  This test is not yet approved or cleared by the [place] FDA and has been authorized for detection and/or diagnosis of SARS-CoV-2 by FDA under an Emergency Use Authorization (EUA). This EUA will remain in effect (meaning this test can be used) for the duration of the COVID-19 declaration under Section 564(b)(1) of the Act, 21 U.S.C. section 360bbb-3(b)(1), unless the authorization is terminated or revoked.     Resp Syncytial Virus by PCR NEGATIVE NEGATIVE Final    Comment: (NOTE) Fact Sheet for  Patients: BloggerCourse.com  Fact Sheet for Healthcare Providers: SeriousBroker.it  This test is not yet approved or cleared by the [place] FDA and has been authorized for detection and/or diagnosis of SARS-CoV-2 by FDA under an Emergency Use Authorization (EUA). This EUA will remain in effect (meaning this test can be used) for the duration of the COVID-19 declaration under Section 564(b)(1) of the Act, 21 U.S.C. section 360bbb-3(b)(1), unless the authorization is terminated  or revoked.  Performed at [facility], [address] [place], [place] [address]      Time coordinating discharge: 35 minutes  SIGNED:   Erick Blinks, DO [facility] 08/25/2023, 12:31 PM  If 7PM-7AM, please contact night-coverage [email or web address]

## 2023-08-25 NOTE — Progress Notes (Signed)
 EMS not out to transport patient to [facility]. Per pt, he states it is this late he is not going back tonight. EMS notified. [facility] contacted. Spoke with Raphael Gibney LPN

## 2023-08-25 NOTE — TOC Transition Note (Signed)
 Transition of Care [facility]) - Discharge Note   Patient Details  Name: Gary Carroll MRN: [id number] Date of Birth: 11/29/1945  Transition of Care [facility]) CM/SW Contact:  [name], LCSW Phone Number: 08/25/2023, 1:17 PM   Clinical Narrative:     [name] received for SNF rehab. Pt agreeable to dc to [facility] today.  DC clinical sent electronically. RN to call report. EMS arranged.  No other TOC needs for dc.  Final next level of care: Skilled Nursing Facility Barriers to Discharge: Barriers Resolved   Patient Goals and CMS Choice Patient states their goals for this hospitalization and ongoing recovery are:: Get stronger CMS Medicare.gov Compare Post Acute Care list provided to:: Patient Choice offered to / list presented to : Patient [place] ownership interest in [facility].provided to:: Patient    Discharge Placement              Patient chooses bed at: Other - please specify in the comment section below: [facility]) Patient to be transferred to facility by: EMS Name of family member notified: pt only Patient and family notified of of transfer: 08/25/23  Discharge Plan and Services Additional resources added to the After Visit Summary for   In-house Referral: Clinical Social Work Discharge Planning Services: CM Consult Post Acute Care Choice: Skilled Nursing Facility                               Social Drivers of Health (SDOH) Interventions SDOH Screenings   Food Insecurity: No Food Insecurity (08/21/2023)  Housing: Low Risk  (08/21/2023)  Transportation Needs: No Transportation Needs (08/21/2023)  Utilities: Not At Risk (08/21/2023)  Alcohol Screen: Low Risk  (02/12/2023)  Depression (PHQ2-9): Low Risk  (02/12/2023)  Social Connections: Moderately Isolated (08/21/2023)  Tobacco Use: Medium Risk (08/21/2023)  Health Literacy: Adequate Health Literacy (03/07/2023)     Readmission Risk Interventions    02/27/2023   11:39 AM 02/07/2021     1:55 PM  Readmission Risk Prevention Plan  Transportation Screening Complete Complete  PCP or Specialist Appt within 5-7 Days  Complete  Home Care Screening  Complete  Medication Review (RN CM)  Referral to Pharmacy  HRI or Home Care Consult Complete   Social Work Consult for Recovery Care Planning/Counseling Complete   Palliative Care Screening Not Applicable   Medication Review Oceanographer) Complete

## 2023-08-25 NOTE — TOC Progression Note (Signed)
 Transition of Care [facility]) - Progression Note    Patient Details  Name: Gary Carroll MRN: [id number] Date of Birth: 1946/04/23  Transition of Care [facility]) CM/SW Contact  [name], LCSW Phone Number: 08/25/2023, 11:14 AM  Clinical Narrative:     TOC following. [facility] can offer bed for pt immediately. [facility] and [facility] can offer at day 11 (possibly sooner if they have private room come available but no guarantee) and [facility] can't take until day 11. Pt medically ready for dc per MD.  Sherron Monday with pt to update on above. Pt agreeable to [facility] using his [employer] benefits.   Updated Debbie at [facility]. TOC starting auth. Pt can dc once auth received. Will follow.   Expected Discharge Plan: Skilled Nursing Facility Barriers to Discharge: Continued Medical Work up  Expected Discharge Plan and Services In-house Referral: Clinical Social Work Discharge Planning Services: CM Consult Post Acute Care Choice: Skilled Nursing Facility Living arrangements for the past 2 months: Single Family Home                                       Social Determinants of Health (SDOH) Interventions SDOH Screenings   Food Insecurity: No Food Insecurity (08/21/2023)  Housing: Low Risk  (08/21/2023)  Transportation Needs: No Transportation Needs (08/21/2023)  Utilities: Not At Risk (08/21/2023)  Alcohol Screen: Low Risk  (02/12/2023)  Depression (PHQ2-9): Low Risk  (02/12/2023)  Social Connections: Moderately Isolated (08/21/2023)  Tobacco Use: Medium Risk (08/21/2023)  Health Literacy: Adequate Health Literacy (03/07/2023)    Readmission Risk Interventions    02/27/2023   11:39 AM 02/07/2021    1:55 PM  Readmission Risk Prevention Plan  Transportation Screening Complete Complete  PCP or Specialist Appt within 5-7 Days  Complete  Home Care Screening  Complete  Medication Review (RN CM)  Referral to Pharmacy  HRI or Home Care Consult Complete   Social  Work Consult for Recovery Care Planning/Counseling Complete   Palliative Care Screening Not Applicable   Medication Review Oceanographer) Complete

## 2023-08-25 NOTE — Progress Notes (Signed)
Attempted to call report. No answer. Will try again later.    

## 2023-08-25 NOTE — Progress Notes (Signed)
 2nd attempt to call report to CV. No answer. Will try again later.

## 2023-08-25 NOTE — Plan of Care (Signed)
  Problem: Education: Goal: Knowledge of risk factors and measures for prevention of condition will improve Outcome: Completed/Met   Problem: Coping: Goal: Psychosocial and spiritual needs will be supported Outcome: Completed/Met   Problem: Respiratory: Goal: Will maintain a patent airway Outcome: Completed/Met Goal: Complications related to the disease process, condition or treatment will be avoided or minimized Outcome: Completed/Met   Problem: Education: Goal: Knowledge of General Education information will improve Description: Including pain rating scale, medication(s)/side effects and non-pharmacologic comfort measures Outcome: Completed/Met   Problem: Health Behavior/Discharge Planning: Goal: Ability to manage health-related needs will improve Outcome: Completed/Met   Problem: Clinical Measurements: Goal: Ability to maintain clinical measurements within normal limits will improve Outcome: Completed/Met Goal: Will remain free from infection Outcome: Completed/Met Goal: Diagnostic test results will improve Outcome: Completed/Met Goal: Respiratory complications will improve Outcome: Completed/Met Goal: Cardiovascular complication will be avoided Outcome: Completed/Met   Problem: Activity: Goal: Risk for activity intolerance will decrease Outcome: Completed/Met   Problem: Nutrition: Goal: Adequate nutrition will be maintained Outcome: Completed/Met   Problem: Coping: Goal: Level of anxiety will decrease Outcome: Completed/Met   Problem: Elimination: Goal: Will not experience complications related to bowel motility Outcome: Completed/Met Goal: Will not experience complications related to urinary retention Outcome: Completed/Met   Problem: Pain Managment: Goal: General experience of comfort will improve and/or be controlled Outcome: Completed/Met   Problem: Safety: Goal: Ability to remain free from injury will improve Outcome: Completed/Met   Problem: Skin  Integrity: Goal: Risk for impaired skin integrity will decrease Outcome: Completed/Met   Problem: Education: Goal: Ability to describe self-care measures that may prevent or decrease complications (Diabetes Survival Skills Education) will improve Outcome: Completed/Met Goal: Individualized Educational Video(s) Outcome: Completed/Met   Problem: Coping: Goal: Ability to adjust to condition or change in health will improve Outcome: Completed/Met   Problem: Fluid Volume: Goal: Ability to maintain a balanced intake and output will improve Outcome: Completed/Met   Problem: Health Behavior/Discharge Planning: Goal: Ability to identify and utilize available resources and services will improve Outcome: Completed/Met Goal: Ability to manage health-related needs will improve Outcome: Completed/Met   Problem: Metabolic: Goal: Ability to maintain appropriate glucose levels will improve Outcome: Completed/Met   Problem: Nutritional: Goal: Maintenance of adequate nutrition will improve Outcome: Completed/Met Goal: Progress toward achieving an optimal weight will improve Outcome: Completed/Met   Problem: Skin Integrity: Goal: Risk for impaired skin integrity will decrease Outcome: Completed/Met   Problem: Tissue Perfusion: Goal: Adequacy of tissue perfusion will improve Outcome: Completed/Met

## 2023-08-26 DIAGNOSIS — F4322 Adjustment disorder with anxiety: Secondary | ICD-10-CM | POA: Diagnosis not present

## 2023-08-26 DIAGNOSIS — N401 Enlarged prostate with lower urinary tract symptoms: Secondary | ICD-10-CM | POA: Diagnosis not present

## 2023-08-26 DIAGNOSIS — C189 Malignant neoplasm of colon, unspecified: Secondary | ICD-10-CM | POA: Diagnosis not present

## 2023-08-26 DIAGNOSIS — Z743 Need for continuous supervision: Secondary | ICD-10-CM | POA: Diagnosis not present

## 2023-08-26 DIAGNOSIS — I5030 Unspecified diastolic (congestive) heart failure: Secondary | ICD-10-CM | POA: Diagnosis not present

## 2023-08-26 DIAGNOSIS — U071 COVID-19: Secondary | ICD-10-CM | POA: Diagnosis not present

## 2023-08-26 DIAGNOSIS — R262 Difficulty in walking, not elsewhere classified: Secondary | ICD-10-CM | POA: Diagnosis not present

## 2023-08-26 DIAGNOSIS — G4733 Obstructive sleep apnea (adult) (pediatric): Secondary | ICD-10-CM | POA: Diagnosis not present

## 2023-08-26 DIAGNOSIS — E782 Mixed hyperlipidemia: Secondary | ICD-10-CM | POA: Diagnosis not present

## 2023-08-26 DIAGNOSIS — M6281 Muscle weakness (generalized): Secondary | ICD-10-CM | POA: Diagnosis not present

## 2023-08-26 DIAGNOSIS — R531 Weakness: Secondary | ICD-10-CM | POA: Diagnosis not present

## 2023-08-26 DIAGNOSIS — I2510 Atherosclerotic heart disease of native coronary artery without angina pectoris: Secondary | ICD-10-CM | POA: Diagnosis not present

## 2023-08-26 DIAGNOSIS — Z7401 Bed confinement status: Secondary | ICD-10-CM | POA: Diagnosis not present

## 2023-08-26 DIAGNOSIS — I10 Essential (primary) hypertension: Secondary | ICD-10-CM | POA: Diagnosis not present

## 2023-08-26 DIAGNOSIS — N400 Enlarged prostate without lower urinary tract symptoms: Secondary | ICD-10-CM | POA: Diagnosis not present

## 2023-08-26 DIAGNOSIS — U099 Post covid-19 condition, unspecified: Secondary | ICD-10-CM | POA: Diagnosis not present

## 2023-08-26 DIAGNOSIS — I350 Nonrheumatic aortic (valve) stenosis: Secondary | ICD-10-CM | POA: Diagnosis not present

## 2023-08-26 DIAGNOSIS — I480 Paroxysmal atrial fibrillation: Secondary | ICD-10-CM | POA: Diagnosis not present

## 2023-08-26 DIAGNOSIS — Z87891 Personal history of nicotine dependence: Secondary | ICD-10-CM | POA: Diagnosis not present

## 2023-08-26 DIAGNOSIS — R278 Other lack of coordination: Secondary | ICD-10-CM | POA: Diagnosis not present

## 2023-08-26 DIAGNOSIS — E876 Hypokalemia: Secondary | ICD-10-CM | POA: Diagnosis not present

## 2023-08-26 DIAGNOSIS — E119 Type 2 diabetes mellitus without complications: Secondary | ICD-10-CM | POA: Diagnosis not present

## 2023-08-26 DIAGNOSIS — Z7901 Long term (current) use of anticoagulants: Secondary | ICD-10-CM | POA: Diagnosis not present

## 2023-08-26 DIAGNOSIS — K219 Gastro-esophageal reflux disease without esophagitis: Secondary | ICD-10-CM | POA: Diagnosis not present

## 2023-08-26 DIAGNOSIS — R5381 Other malaise: Secondary | ICD-10-CM | POA: Diagnosis not present

## 2023-08-26 DIAGNOSIS — G4700 Insomnia, unspecified: Secondary | ICD-10-CM | POA: Diagnosis not present

## 2023-08-26 LAB — GLUCOSE, CAPILLARY: Glucose-Capillary: 98 mg/dL (ref 70–99)

## 2023-08-26 NOTE — TOC Transition Note (Signed)
 Transition of Care [facility]) - Discharge Note   Patient Details  Name: Gary Carroll MRN: [id number] Date of Birth: 1946-03-23  [facility]) CM/SW Contact:  Villa Herb, LCSWA Phone Number: 08/26/2023, 10:50 AM   Clinical Narrative:    Facility updated on why D/C did not happen yesterday. RN called for report at this time. Facility aware and ready to accept pt today. TOC signing off.   Final next level of care: Skilled Nursing Facility Barriers to Discharge: Barriers Resolved   Patient Goals and CMS Choice Patient states their goals for this hospitalization and ongoing recovery are:: Get stronger CMS Medicare.gov Compare Post Acute Care list provided to:: Patient Choice offered to / list presented to : Patient [place] ownership interest in [facility].provided to:: Patient    Discharge Placement              Patient chooses bed at: Other - please specify in the comment section below: [facility]) Patient to be transferred to facility by: EMS Name of family member notified: pt only Patient and family notified of of transfer: 08/25/23  Discharge Plan and Services Additional resources added to the After Visit Summary for   In-house Referral: Clinical Social Work Discharge Planning Services: CM Consult Post Acute Care Choice: Skilled Nursing Facility                               Social Drivers of Health (SDOH) Interventions SDOH Screenings   Food Insecurity: No Food Insecurity (08/21/2023)  Housing: Low Risk  (08/21/2023)  Transportation Needs: No Transportation Needs (08/21/2023)  Utilities: Not At Risk (08/21/2023)  Alcohol Screen: Low Risk  (02/12/2023)  Depression (PHQ2-9): Low Risk  (02/12/2023)  Social Connections: Moderately Isolated (08/21/2023)  Tobacco Use: Medium Risk (08/21/2023)  Health Literacy: Adequate Health Literacy (03/07/2023)     Readmission Risk Interventions    02/27/2023   11:39 AM 02/07/2021    1:55 PM   Readmission Risk Prevention Plan  Transportation Screening Complete Complete  PCP or Specialist Appt within 5-7 Days  Complete  Home Care Screening  Complete  Medication Review (RN CM)  Referral to Pharmacy  HRI or Home Care Consult Complete   Social Work Consult for Recovery Care Planning/Counseling Complete   Palliative Care Screening Not Applicable   Medication Review Oceanographer) Complete

## 2023-08-26 NOTE — Progress Notes (Signed)
 EMS in to transport patient to [facility]. No distress observed. Report given to Nettie Elm, LPN at [facility].

## 2023-08-26 NOTE — Progress Notes (Signed)
 Patient seen and evaluated this morning.  He was supposed to transport with EMS to SNF last night, but refused transfer stating that it was too late.  No other acute events or concerns noted since discharge summary dictated 4/7.  Please refer to discharge summary for full details.  Total care time: 15 minutes.

## 2023-08-27 DIAGNOSIS — R262 Difficulty in walking, not elsewhere classified: Secondary | ICD-10-CM | POA: Diagnosis not present

## 2023-08-27 DIAGNOSIS — I2510 Atherosclerotic heart disease of native coronary artery without angina pectoris: Secondary | ICD-10-CM | POA: Diagnosis not present

## 2023-08-27 DIAGNOSIS — E119 Type 2 diabetes mellitus without complications: Secondary | ICD-10-CM | POA: Diagnosis not present

## 2023-08-27 DIAGNOSIS — I480 Paroxysmal atrial fibrillation: Secondary | ICD-10-CM | POA: Diagnosis not present

## 2023-08-27 DIAGNOSIS — N400 Enlarged prostate without lower urinary tract symptoms: Secondary | ICD-10-CM | POA: Diagnosis not present

## 2023-08-27 DIAGNOSIS — R531 Weakness: Secondary | ICD-10-CM | POA: Diagnosis not present

## 2023-08-27 DIAGNOSIS — I10 Essential (primary) hypertension: Secondary | ICD-10-CM | POA: Diagnosis not present

## 2023-08-28 DIAGNOSIS — R262 Difficulty in walking, not elsewhere classified: Secondary | ICD-10-CM | POA: Diagnosis not present

## 2023-08-28 DIAGNOSIS — R531 Weakness: Secondary | ICD-10-CM | POA: Diagnosis not present

## 2023-08-28 DIAGNOSIS — N400 Enlarged prostate without lower urinary tract symptoms: Secondary | ICD-10-CM | POA: Diagnosis not present

## 2023-08-28 DIAGNOSIS — U071 COVID-19: Secondary | ICD-10-CM | POA: Diagnosis not present

## 2023-08-28 DIAGNOSIS — E119 Type 2 diabetes mellitus without complications: Secondary | ICD-10-CM | POA: Diagnosis not present

## 2023-08-28 DIAGNOSIS — I480 Paroxysmal atrial fibrillation: Secondary | ICD-10-CM | POA: Diagnosis not present

## 2023-08-28 DIAGNOSIS — I10 Essential (primary) hypertension: Secondary | ICD-10-CM | POA: Diagnosis not present

## 2023-08-28 DIAGNOSIS — I2510 Atherosclerotic heart disease of native coronary artery without angina pectoris: Secondary | ICD-10-CM | POA: Diagnosis not present

## 2023-08-29 DIAGNOSIS — R262 Difficulty in walking, not elsewhere classified: Secondary | ICD-10-CM | POA: Diagnosis not present

## 2023-08-29 DIAGNOSIS — I480 Paroxysmal atrial fibrillation: Secondary | ICD-10-CM | POA: Diagnosis not present

## 2023-08-29 DIAGNOSIS — I2510 Atherosclerotic heart disease of native coronary artery without angina pectoris: Secondary | ICD-10-CM | POA: Diagnosis not present

## 2023-08-29 DIAGNOSIS — E119 Type 2 diabetes mellitus without complications: Secondary | ICD-10-CM | POA: Diagnosis not present

## 2023-08-29 DIAGNOSIS — R531 Weakness: Secondary | ICD-10-CM | POA: Diagnosis not present

## 2023-08-29 DIAGNOSIS — N400 Enlarged prostate without lower urinary tract symptoms: Secondary | ICD-10-CM | POA: Diagnosis not present

## 2023-08-29 DIAGNOSIS — I10 Essential (primary) hypertension: Secondary | ICD-10-CM | POA: Diagnosis not present

## 2023-09-01 DIAGNOSIS — I480 Paroxysmal atrial fibrillation: Secondary | ICD-10-CM | POA: Diagnosis not present

## 2023-09-01 DIAGNOSIS — N400 Enlarged prostate without lower urinary tract symptoms: Secondary | ICD-10-CM | POA: Diagnosis not present

## 2023-09-01 DIAGNOSIS — F4322 Adjustment disorder with anxiety: Secondary | ICD-10-CM | POA: Diagnosis not present

## 2023-09-01 DIAGNOSIS — R262 Difficulty in walking, not elsewhere classified: Secondary | ICD-10-CM | POA: Diagnosis not present

## 2023-09-01 DIAGNOSIS — E119 Type 2 diabetes mellitus without complications: Secondary | ICD-10-CM | POA: Diagnosis not present

## 2023-09-01 DIAGNOSIS — I10 Essential (primary) hypertension: Secondary | ICD-10-CM | POA: Diagnosis not present

## 2023-09-01 DIAGNOSIS — R531 Weakness: Secondary | ICD-10-CM | POA: Diagnosis not present

## 2023-09-01 DIAGNOSIS — I2510 Atherosclerotic heart disease of native coronary artery without angina pectoris: Secondary | ICD-10-CM | POA: Diagnosis not present

## 2023-09-02 DIAGNOSIS — Z87891 Personal history of nicotine dependence: Secondary | ICD-10-CM | POA: Diagnosis not present

## 2023-09-02 DIAGNOSIS — G4700 Insomnia, unspecified: Secondary | ICD-10-CM | POA: Diagnosis not present

## 2023-09-03 DIAGNOSIS — R531 Weakness: Secondary | ICD-10-CM | POA: Diagnosis not present

## 2023-09-03 DIAGNOSIS — I2510 Atherosclerotic heart disease of native coronary artery without angina pectoris: Secondary | ICD-10-CM | POA: Diagnosis not present

## 2023-09-03 DIAGNOSIS — E119 Type 2 diabetes mellitus without complications: Secondary | ICD-10-CM | POA: Diagnosis not present

## 2023-09-03 DIAGNOSIS — R262 Difficulty in walking, not elsewhere classified: Secondary | ICD-10-CM | POA: Diagnosis not present

## 2023-09-03 DIAGNOSIS — N400 Enlarged prostate without lower urinary tract symptoms: Secondary | ICD-10-CM | POA: Diagnosis not present

## 2023-09-03 DIAGNOSIS — I10 Essential (primary) hypertension: Secondary | ICD-10-CM | POA: Diagnosis not present

## 2023-09-03 DIAGNOSIS — I480 Paroxysmal atrial fibrillation: Secondary | ICD-10-CM | POA: Diagnosis not present

## 2023-09-05 ENCOUNTER — Ambulatory Visit: Payer: Self-pay | Admitting: *Deleted

## 2023-09-05 ENCOUNTER — Other Ambulatory Visit: Payer: Self-pay

## 2023-09-05 DIAGNOSIS — I10 Essential (primary) hypertension: Secondary | ICD-10-CM | POA: Diagnosis not present

## 2023-09-05 DIAGNOSIS — I480 Paroxysmal atrial fibrillation: Secondary | ICD-10-CM | POA: Diagnosis not present

## 2023-09-05 DIAGNOSIS — R262 Difficulty in walking, not elsewhere classified: Secondary | ICD-10-CM | POA: Diagnosis not present

## 2023-09-05 DIAGNOSIS — R531 Weakness: Secondary | ICD-10-CM | POA: Diagnosis not present

## 2023-09-05 DIAGNOSIS — N400 Enlarged prostate without lower urinary tract symptoms: Secondary | ICD-10-CM | POA: Diagnosis not present

## 2023-09-05 DIAGNOSIS — E119 Type 2 diabetes mellitus without complications: Secondary | ICD-10-CM | POA: Diagnosis not present

## 2023-09-05 DIAGNOSIS — I2510 Atherosclerotic heart disease of native coronary artery without angina pectoris: Secondary | ICD-10-CM | POA: Diagnosis not present

## 2023-09-06 NOTE — Patient Outreach (Signed)
 Complex Care Management   Visit Note  09/06/2023 Entry for 09/05/23  Name:  Gary Carroll MRN: [id number] DOB: Aug 07, 1945  Situation: Referral received for Complex Care Management related to Heart Failure, Atrial Fibrillation, and s/p TAVR replacement from [facility] hospital Liaison, Gary Carroll  I obtained verbal consent from Patient.  Visit completed with [name]  on the phone  Background:   Past Medical History:  Diagnosis Date   A-fib [facility])    Allergic rhinitis    Arthritis    Asthma    seasonal per pt   Bilateral knee pain    OA   CAD (coronary artery disease)    Diabetes mellitus without complication (HCC)    Diverticulosis 02/2012   seen on colonoscopy   Erectile dysfunction    GERD (gastroesophageal reflux disease)    Hiatal hernia    HLD (hyperlipidemia)    HTN (hypertension)    Hydronephrosis determined by ultrasound 08/22/2015   Overactive bladder    Prediabetes    Pulmonary lesion    Ringing of ears    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 Arlester Ladd and Dr. Sherene Dilling   Severe aortic stenosis    Sleep apnea    Urinary frequency    Urinary urgency    Wears glasses     Assessment: Patient Reported Symptoms:  Cognitive Cognitive Status: Alert and oriented to person, place, and time, Able to follow simple commands, Normal speech and language skills Cognitive/Intellectual Conditions Management [RPT]: None reported or documented in medical history or problem list   Health Maintenance Behaviors: Hobbies, Sleep adequate Healing Pattern: Average Health Facilitated by: Rest  Neurological Neurological Review of Symptoms: Weakness Neurological Management Strategies: Adequate rest, Routine screening Neurological Self-Management Outcome: 3 (uncertain)  HEENT HEENT Symptoms Reported: No symptoms reported HEENT Management Strategies: Adequate rest, Routine screening HEENT Self-Management Outcome: 4  (good)    Cardiovascular      Respiratory Respiratory Symptoms Reported: Shortness of breath, Other: Other Respiratory Symptoms: covid 19 admission Respiratory Self-Management Outcome: 2 (bad)  Endocrine Patient reports the following symptoms related to hypoglycemia or hyperglycemia : Not assessed    Gastrointestinal Gastrointestinal Symptoms Reported: Diarrhea Gastrointestinal Conditions: Diarrhea, Reflux/heartburn, Constipation, Other Other Gastrointestinal Conditions: hemmorhoids Gastrointestinal Management Strategies: Medication therapy, Fluid modification, Adequate rest, Incontinence garment/pad Gastrointestinal Self-Management Outcome: 3 (uncertain)    Genitourinary Genitourinary Symptoms Reported: Not assessed    Integumentary Integumentary Symptoms Reported: Not assessed    Musculoskeletal Musculoskelatal Symptoms Reviewed: Not assessed        Psychosocial Psychosocial Symptoms Reported: No symptoms reported Behavioral Management Strategies: Activity, Adequate rest, Community resources, Personnel officer Health Self-Management Outcome: 4 (good) Behavioral Health Comment: support of executor of his estate Major Change/Loss/Stressor/Fears (CP): Medical condition, self Techniques to [employer] with Loss/Stress/Change: Diversional activities Quality of Family Relationships: helpful, involved, supportive Do you feel physically threatened by others?: No      02/12/2023   12:01 PM  Depression screen PHQ 2/9  Decreased Interest 0  Down, Depressed, Hopeless 0  PHQ - 2 Score 0    There were no vitals filed for this visit.  Medications Reviewed Today   Medications were not reviewed in this encounter     Recommendation:   PCP Follow-up Encouraged to continue participation in Skilled Nursing Facility Rehab program with anticipated discharge home with home health services. Patient encouraged to speak with the facility case manager or social worker related to discharge  plans  Follow Up Plan:   Telephone follow up appointment date/time:  09/26/23 1100  Jovonte Commins L. Mcarthur Speedy, RN, BSN, CCM [place]  [facility], [facility] RN Care Manager Direct Dial: [phone]  Fax: [phone]

## 2023-09-06 NOTE — Patient Instructions (Signed)
 Visit Information  Thank you for taking time to visit with me today. Please don't hesitate to contact me if I can be of assistance to you before our next scheduled appointment.  Your next care management appointment is by telephone on 09/26/23 at 1100  Please collaborate with your Rehab skilled nursing facility social worker for anticipated home discharge plans/ home health services   Please call the care guide team at [phone] if you need to cancel, schedule, or reschedule an appointment.   Please call the Suicide and Crisis Lifeline: 988 call the [place]  National Suicide Prevention Lifeline: 902-132-5462 or TTY: 502-709-1639 TTY (308)419-3793) to talk to a trained counselor call 1-800-273-TALK (toll free, 24 hour hotline) call the [facility]: 402-469-1601 call 911 if you are experiencing a Mental Health or Behavioral Health Crisis or need someone to talk to.  Shirlyn Savin L. Mcarthur Speedy, RN, BSN, CCM [place]  [facility], [facility] Health RN Care Manager Direct Dial: [phone]  Fax: [phone]

## 2023-09-08 DIAGNOSIS — N400 Enlarged prostate without lower urinary tract symptoms: Secondary | ICD-10-CM | POA: Diagnosis not present

## 2023-09-08 DIAGNOSIS — R531 Weakness: Secondary | ICD-10-CM | POA: Diagnosis not present

## 2023-09-08 DIAGNOSIS — E119 Type 2 diabetes mellitus without complications: Secondary | ICD-10-CM | POA: Diagnosis not present

## 2023-09-08 DIAGNOSIS — R262 Difficulty in walking, not elsewhere classified: Secondary | ICD-10-CM | POA: Diagnosis not present

## 2023-09-08 DIAGNOSIS — I2510 Atherosclerotic heart disease of native coronary artery without angina pectoris: Secondary | ICD-10-CM | POA: Diagnosis not present

## 2023-09-08 DIAGNOSIS — I480 Paroxysmal atrial fibrillation: Secondary | ICD-10-CM | POA: Diagnosis not present

## 2023-09-08 DIAGNOSIS — I10 Essential (primary) hypertension: Secondary | ICD-10-CM | POA: Diagnosis not present

## 2023-09-10 DIAGNOSIS — I2510 Atherosclerotic heart disease of native coronary artery without angina pectoris: Secondary | ICD-10-CM | POA: Diagnosis not present

## 2023-09-10 DIAGNOSIS — R531 Weakness: Secondary | ICD-10-CM | POA: Diagnosis not present

## 2023-09-10 DIAGNOSIS — R262 Difficulty in walking, not elsewhere classified: Secondary | ICD-10-CM | POA: Diagnosis not present

## 2023-09-10 DIAGNOSIS — N400 Enlarged prostate without lower urinary tract symptoms: Secondary | ICD-10-CM | POA: Diagnosis not present

## 2023-09-10 DIAGNOSIS — I480 Paroxysmal atrial fibrillation: Secondary | ICD-10-CM | POA: Diagnosis not present

## 2023-09-10 DIAGNOSIS — I10 Essential (primary) hypertension: Secondary | ICD-10-CM | POA: Diagnosis not present

## 2023-09-10 DIAGNOSIS — E119 Type 2 diabetes mellitus without complications: Secondary | ICD-10-CM | POA: Diagnosis not present

## 2023-09-12 DIAGNOSIS — I10 Essential (primary) hypertension: Secondary | ICD-10-CM | POA: Diagnosis not present

## 2023-09-12 DIAGNOSIS — E119 Type 2 diabetes mellitus without complications: Secondary | ICD-10-CM | POA: Diagnosis not present

## 2023-09-12 DIAGNOSIS — N400 Enlarged prostate without lower urinary tract symptoms: Secondary | ICD-10-CM | POA: Diagnosis not present

## 2023-09-12 DIAGNOSIS — R262 Difficulty in walking, not elsewhere classified: Secondary | ICD-10-CM | POA: Diagnosis not present

## 2023-09-12 DIAGNOSIS — I2510 Atherosclerotic heart disease of native coronary artery without angina pectoris: Secondary | ICD-10-CM | POA: Diagnosis not present

## 2023-09-12 DIAGNOSIS — R531 Weakness: Secondary | ICD-10-CM | POA: Diagnosis not present

## 2023-09-12 DIAGNOSIS — I480 Paroxysmal atrial fibrillation: Secondary | ICD-10-CM | POA: Diagnosis not present

## 2023-09-15 DIAGNOSIS — I480 Paroxysmal atrial fibrillation: Secondary | ICD-10-CM | POA: Diagnosis not present

## 2023-09-15 DIAGNOSIS — I10 Essential (primary) hypertension: Secondary | ICD-10-CM | POA: Diagnosis not present

## 2023-09-15 DIAGNOSIS — E119 Type 2 diabetes mellitus without complications: Secondary | ICD-10-CM | POA: Diagnosis not present

## 2023-09-15 DIAGNOSIS — I2510 Atherosclerotic heart disease of native coronary artery without angina pectoris: Secondary | ICD-10-CM | POA: Diagnosis not present

## 2023-09-15 DIAGNOSIS — R531 Weakness: Secondary | ICD-10-CM | POA: Diagnosis not present

## 2023-09-15 DIAGNOSIS — R262 Difficulty in walking, not elsewhere classified: Secondary | ICD-10-CM | POA: Diagnosis not present

## 2023-09-15 DIAGNOSIS — N400 Enlarged prostate without lower urinary tract symptoms: Secondary | ICD-10-CM | POA: Diagnosis not present

## 2023-09-22 ENCOUNTER — Emergency Department (HOSPITAL_COMMUNITY)

## 2023-09-22 ENCOUNTER — Other Ambulatory Visit: Payer: Self-pay

## 2023-09-22 ENCOUNTER — Inpatient Hospital Stay (HOSPITAL_COMMUNITY)
Admission: EM | Admit: 2023-09-22 | Discharge: 2023-09-30 | DRG: 872 | Disposition: A | Discharge status: Skilled Nursing Facility | Attending: Family Medicine | Admitting: Family Medicine

## 2023-09-22 ENCOUNTER — Encounter (HOSPITAL_COMMUNITY): Payer: Self-pay | Admitting: Emergency Medicine

## 2023-09-22 DIAGNOSIS — Z96651 Presence of right artificial knee joint: Secondary | ICD-10-CM | POA: Diagnosis present

## 2023-09-22 DIAGNOSIS — Z7984 Long term (current) use of oral hypoglycemic drugs: Secondary | ICD-10-CM

## 2023-09-22 DIAGNOSIS — R531 Weakness: Secondary | ICD-10-CM | POA: Diagnosis not present

## 2023-09-22 DIAGNOSIS — I10 Essential (primary) hypertension: Secondary | ICD-10-CM | POA: Diagnosis present

## 2023-09-22 DIAGNOSIS — J9601 Acute respiratory failure with hypoxia: Secondary | ICD-10-CM | POA: Diagnosis not present

## 2023-09-22 DIAGNOSIS — I700 Atherosclerosis of aorta: Secondary | ICD-10-CM | POA: Diagnosis not present

## 2023-09-22 DIAGNOSIS — A419 Sepsis, unspecified organism: Secondary | ICD-10-CM | POA: Diagnosis present

## 2023-09-22 DIAGNOSIS — Z8049 Family history of malignant neoplasm of other genital organs: Secondary | ICD-10-CM

## 2023-09-22 DIAGNOSIS — E782 Mixed hyperlipidemia: Secondary | ICD-10-CM | POA: Diagnosis present

## 2023-09-22 DIAGNOSIS — N138 Other obstructive and reflux uropathy: Secondary | ICD-10-CM | POA: Diagnosis present

## 2023-09-22 DIAGNOSIS — B9689 Other specified bacterial agents as the cause of diseases classified elsewhere: Secondary | ICD-10-CM | POA: Diagnosis present

## 2023-09-22 DIAGNOSIS — R5381 Other malaise: Secondary | ICD-10-CM | POA: Diagnosis not present

## 2023-09-22 DIAGNOSIS — Z952 Presence of prosthetic heart valve: Secondary | ICD-10-CM

## 2023-09-22 DIAGNOSIS — Z043 Encounter for examination and observation following other accident: Secondary | ICD-10-CM | POA: Diagnosis not present

## 2023-09-22 DIAGNOSIS — Z9842 Cataract extraction status, left eye: Secondary | ICD-10-CM

## 2023-09-22 DIAGNOSIS — Z7901 Long term (current) use of anticoagulants: Secondary | ICD-10-CM

## 2023-09-22 DIAGNOSIS — Z85048 Personal history of other malignant neoplasm of rectum, rectosigmoid junction, and anus: Secondary | ICD-10-CM

## 2023-09-22 DIAGNOSIS — R54 Age-related physical debility: Secondary | ICD-10-CM | POA: Diagnosis present

## 2023-09-22 DIAGNOSIS — I350 Nonrheumatic aortic (valve) stenosis: Secondary | ICD-10-CM

## 2023-09-22 DIAGNOSIS — E66811 Obesity, class 1: Secondary | ICD-10-CM | POA: Diagnosis present

## 2023-09-22 DIAGNOSIS — K219 Gastro-esophageal reflux disease without esophagitis: Secondary | ICD-10-CM | POA: Diagnosis present

## 2023-09-22 DIAGNOSIS — I2510 Atherosclerotic heart disease of native coronary artery without angina pectoris: Secondary | ICD-10-CM | POA: Diagnosis present

## 2023-09-22 DIAGNOSIS — Z1152 Encounter for screening for COVID-19: Secondary | ICD-10-CM

## 2023-09-22 DIAGNOSIS — Z961 Presence of intraocular lens: Secondary | ICD-10-CM | POA: Diagnosis present

## 2023-09-22 DIAGNOSIS — A4151 Sepsis due to Escherichia coli [E. coli]: Secondary | ICD-10-CM | POA: Diagnosis not present

## 2023-09-22 DIAGNOSIS — Z79899 Other long term (current) drug therapy: Secondary | ICD-10-CM

## 2023-09-22 DIAGNOSIS — I5032 Chronic diastolic (congestive) heart failure: Secondary | ICD-10-CM | POA: Diagnosis present

## 2023-09-22 DIAGNOSIS — R609 Edema, unspecified: Secondary | ICD-10-CM | POA: Diagnosis not present

## 2023-09-22 DIAGNOSIS — N401 Enlarged prostate with lower urinary tract symptoms: Secondary | ICD-10-CM | POA: Diagnosis present

## 2023-09-22 DIAGNOSIS — Z8249 Family history of ischemic heart disease and other diseases of the circulatory system: Secondary | ICD-10-CM

## 2023-09-22 DIAGNOSIS — R1310 Dysphagia, unspecified: Secondary | ICD-10-CM | POA: Diagnosis present

## 2023-09-22 DIAGNOSIS — Z6834 Body mass index (BMI) 34.0-34.9, adult: Secondary | ICD-10-CM

## 2023-09-22 DIAGNOSIS — Z8616 Personal history of COVID-19: Secondary | ICD-10-CM

## 2023-09-22 DIAGNOSIS — Z743 Need for continuous supervision: Secondary | ICD-10-CM | POA: Diagnosis not present

## 2023-09-22 DIAGNOSIS — E119 Type 2 diabetes mellitus without complications: Secondary | ICD-10-CM

## 2023-09-22 DIAGNOSIS — I110 Hypertensive heart disease with heart failure: Secondary | ICD-10-CM | POA: Diagnosis present

## 2023-09-22 DIAGNOSIS — Z9841 Cataract extraction status, right eye: Secondary | ICD-10-CM

## 2023-09-22 DIAGNOSIS — R0602 Shortness of breath: Secondary | ICD-10-CM | POA: Diagnosis not present

## 2023-09-22 DIAGNOSIS — E785 Hyperlipidemia, unspecified: Secondary | ICD-10-CM | POA: Diagnosis present

## 2023-09-22 DIAGNOSIS — J449 Chronic obstructive pulmonary disease, unspecified: Secondary | ICD-10-CM | POA: Diagnosis present

## 2023-09-22 DIAGNOSIS — I5030 Unspecified diastolic (congestive) heart failure: Secondary | ICD-10-CM | POA: Diagnosis present

## 2023-09-22 DIAGNOSIS — G4733 Obstructive sleep apnea (adult) (pediatric): Secondary | ICD-10-CM | POA: Diagnosis present

## 2023-09-22 DIAGNOSIS — Z515 Encounter for palliative care: Secondary | ICD-10-CM

## 2023-09-22 DIAGNOSIS — C20 Malignant neoplasm of rectum: Secondary | ICD-10-CM | POA: Diagnosis present

## 2023-09-22 DIAGNOSIS — I480 Paroxysmal atrial fibrillation: Secondary | ICD-10-CM | POA: Diagnosis present

## 2023-09-22 DIAGNOSIS — I4892 Unspecified atrial flutter: Secondary | ICD-10-CM | POA: Diagnosis present

## 2023-09-22 DIAGNOSIS — Z888 Allergy status to other drugs, medicaments and biological substances status: Secondary | ICD-10-CM

## 2023-09-22 DIAGNOSIS — R627 Adult failure to thrive: Secondary | ICD-10-CM | POA: Diagnosis present

## 2023-09-22 DIAGNOSIS — Z87891 Personal history of nicotine dependence: Secondary | ICD-10-CM

## 2023-09-22 DIAGNOSIS — N390 Urinary tract infection, site not specified: Secondary | ICD-10-CM | POA: Diagnosis present

## 2023-09-22 NOTE — ED Triage Notes (Signed)
 Pt lives by himself and is having mobility issues and has fallen tonight. Pt is soiled with urine and feces. Pt called ems because he was cold.

## 2023-09-23 ENCOUNTER — Other Ambulatory Visit: Payer: Self-pay

## 2023-09-23 DIAGNOSIS — A419 Sepsis, unspecified organism: Secondary | ICD-10-CM | POA: Diagnosis not present

## 2023-09-23 DIAGNOSIS — Z7984 Long term (current) use of oral hypoglycemic drugs: Secondary | ICD-10-CM | POA: Diagnosis not present

## 2023-09-23 DIAGNOSIS — Z7901 Long term (current) use of anticoagulants: Secondary | ICD-10-CM | POA: Diagnosis not present

## 2023-09-23 DIAGNOSIS — I4892 Unspecified atrial flutter: Secondary | ICD-10-CM | POA: Diagnosis present

## 2023-09-23 DIAGNOSIS — M6281 Muscle weakness (generalized): Secondary | ICD-10-CM | POA: Diagnosis not present

## 2023-09-23 DIAGNOSIS — R531 Weakness: Secondary | ICD-10-CM | POA: Diagnosis not present

## 2023-09-23 DIAGNOSIS — I480 Paroxysmal atrial fibrillation: Secondary | ICD-10-CM | POA: Diagnosis not present

## 2023-09-23 DIAGNOSIS — R262 Difficulty in walking, not elsewhere classified: Secondary | ICD-10-CM | POA: Diagnosis not present

## 2023-09-23 DIAGNOSIS — I350 Nonrheumatic aortic (valve) stenosis: Secondary | ICD-10-CM | POA: Diagnosis not present

## 2023-09-23 DIAGNOSIS — I2510 Atherosclerotic heart disease of native coronary artery without angina pectoris: Secondary | ICD-10-CM | POA: Diagnosis not present

## 2023-09-23 DIAGNOSIS — N138 Other obstructive and reflux uropathy: Secondary | ICD-10-CM | POA: Diagnosis not present

## 2023-09-23 DIAGNOSIS — Z952 Presence of prosthetic heart valve: Secondary | ICD-10-CM | POA: Diagnosis not present

## 2023-09-23 DIAGNOSIS — K219 Gastro-esophageal reflux disease without esophagitis: Secondary | ICD-10-CM | POA: Diagnosis present

## 2023-09-23 DIAGNOSIS — E119 Type 2 diabetes mellitus without complications: Secondary | ICD-10-CM | POA: Diagnosis not present

## 2023-09-23 DIAGNOSIS — C20 Malignant neoplasm of rectum: Secondary | ICD-10-CM | POA: Diagnosis not present

## 2023-09-23 DIAGNOSIS — Z743 Need for continuous supervision: Secondary | ICD-10-CM | POA: Diagnosis not present

## 2023-09-23 DIAGNOSIS — Z043 Encounter for examination and observation following other accident: Secondary | ICD-10-CM | POA: Diagnosis not present

## 2023-09-23 DIAGNOSIS — A4151 Sepsis due to Escherichia coli [E. coli]: Secondary | ICD-10-CM | POA: Diagnosis present

## 2023-09-23 DIAGNOSIS — N390 Urinary tract infection, site not specified: Secondary | ICD-10-CM | POA: Diagnosis present

## 2023-09-23 DIAGNOSIS — I5032 Chronic diastolic (congestive) heart failure: Secondary | ICD-10-CM | POA: Diagnosis not present

## 2023-09-23 DIAGNOSIS — N139 Obstructive and reflux uropathy, unspecified: Secondary | ICD-10-CM | POA: Diagnosis not present

## 2023-09-23 DIAGNOSIS — I5030 Unspecified diastolic (congestive) heart failure: Secondary | ICD-10-CM | POA: Diagnosis not present

## 2023-09-23 DIAGNOSIS — R319 Hematuria, unspecified: Secondary | ICD-10-CM | POA: Diagnosis not present

## 2023-09-23 DIAGNOSIS — Z7189 Other specified counseling: Secondary | ICD-10-CM | POA: Diagnosis not present

## 2023-09-23 DIAGNOSIS — E66811 Obesity, class 1: Secondary | ICD-10-CM | POA: Diagnosis not present

## 2023-09-23 DIAGNOSIS — Z515 Encounter for palliative care: Secondary | ICD-10-CM | POA: Diagnosis not present

## 2023-09-23 DIAGNOSIS — R2681 Unsteadiness on feet: Secondary | ICD-10-CM | POA: Diagnosis not present

## 2023-09-23 DIAGNOSIS — E782 Mixed hyperlipidemia: Secondary | ICD-10-CM | POA: Diagnosis not present

## 2023-09-23 DIAGNOSIS — I10 Essential (primary) hypertension: Secondary | ICD-10-CM | POA: Diagnosis not present

## 2023-09-23 DIAGNOSIS — Z8616 Personal history of COVID-19: Secondary | ICD-10-CM | POA: Diagnosis not present

## 2023-09-23 DIAGNOSIS — Z1152 Encounter for screening for COVID-19: Secondary | ICD-10-CM | POA: Diagnosis not present

## 2023-09-23 DIAGNOSIS — J449 Chronic obstructive pulmonary disease, unspecified: Secondary | ICD-10-CM | POA: Diagnosis not present

## 2023-09-23 DIAGNOSIS — R1310 Dysphagia, unspecified: Secondary | ICD-10-CM | POA: Diagnosis present

## 2023-09-23 DIAGNOSIS — I110 Hypertensive heart disease with heart failure: Secondary | ICD-10-CM | POA: Diagnosis present

## 2023-09-23 DIAGNOSIS — N401 Enlarged prostate with lower urinary tract symptoms: Secondary | ICD-10-CM | POA: Diagnosis not present

## 2023-09-23 DIAGNOSIS — R627 Adult failure to thrive: Secondary | ICD-10-CM | POA: Diagnosis present

## 2023-09-23 DIAGNOSIS — G4733 Obstructive sleep apnea (adult) (pediatric): Secondary | ICD-10-CM | POA: Diagnosis not present

## 2023-09-23 LAB — URINALYSIS, W/ REFLEX TO CULTURE (INFECTION SUSPECTED)
Bilirubin Urine: NEGATIVE
Glucose, UA: NEGATIVE mg/dL
Hgb urine dipstick: NEGATIVE
Ketones, ur: NEGATIVE mg/dL
Nitrite: POSITIVE — AB
Protein, ur: 30 mg/dL — AB
Specific Gravity, Urine: 1.019 (ref 1.005–1.030)
WBC, UA: >50 WBC/hpf (ref 0–5)
pH: 6 (ref 5.0–8.0)

## 2023-09-23 LAB — BLOOD CULTURE ID PANEL (REFLEXED) - BCID2

## 2023-09-23 LAB — CBC WITH DIFFERENTIAL/PLATELET
Abs Immature Granulocytes: 0.04 10*3/uL (ref 0.00–0.07)
Basophils Absolute: 0 10*3/uL (ref 0.0–0.1)
Basophils Relative: 0 %
Eosinophils Absolute: 0 10*3/uL (ref 0.0–0.5)
Eosinophils Relative: 0 %
HCT: 39.8 % (ref 39.0–52.0)
Hemoglobin: 13 g/dL (ref 13.0–17.0)
Immature Granulocytes: 1 %
Lymphocytes Relative: 8 %
Lymphs Abs: 0.5 10*3/uL — ABNORMAL LOW (ref 0.7–4.0)
MCH: 30 pg (ref 26.0–34.0)
MCHC: 32.7 g/dL (ref 30.0–36.0)
MCV: 91.9 fL (ref 80.0–100.0)
Monocytes Absolute: 0.5 10*3/uL (ref 0.1–1.0)
Monocytes Relative: 9 %
Neutro Abs: 4.4 10*3/uL (ref 1.7–7.7)
Neutrophils Relative %: 82 %
Platelets: 123 10*3/uL — ABNORMAL LOW (ref 150–400)
RBC: 4.33 MIL/uL (ref 4.22–5.81)
RDW: 15.9 % — ABNORMAL HIGH (ref 11.5–15.5)
WBC: 5.4 10*3/uL (ref 4.0–10.5)
nRBC: 0 % (ref 0.0–0.2)

## 2023-09-23 LAB — COMPREHENSIVE METABOLIC PANEL WITH GFR
ALT: 63 U/L — ABNORMAL HIGH (ref 0–44)
AST: 62 U/L — ABNORMAL HIGH (ref 15–41)
Albumin: 3 g/dL — ABNORMAL LOW (ref 3.5–5.0)
Alkaline Phosphatase: 66 U/L (ref 38–126)
Anion gap: 9 (ref 5–15)
BUN: 13 mg/dL (ref 8–23)
CO2: 23 mmol/L (ref 22–32)
Calcium: 8.5 mg/dL — ABNORMAL LOW (ref 8.9–10.3)
Chloride: 105 mmol/L (ref 98–111)
Creatinine, Ser: 0.8 mg/dL (ref 0.61–1.24)
GFR, Estimated: >60 mL/min (ref >60)
Glucose, Bld: 120 mg/dL — ABNORMAL HIGH (ref 70–99)
Potassium: 4.3 mmol/L (ref 3.5–5.1)
Sodium: 137 mmol/L (ref 135–145)
Total Bilirubin: 0.5 mg/dL (ref 0.0–1.2)
Total Protein: 6.6 g/dL (ref 6.5–8.1)

## 2023-09-23 LAB — GLUCOSE, CAPILLARY
Glucose-Capillary: 96 mg/dL (ref 70–99)
Glucose-Capillary: 95 mg/dL (ref 70–99)
Glucose-Capillary: 82 mg/dL (ref 70–99)
Glucose-Capillary: 109 mg/dL — ABNORMAL HIGH (ref 70–99)

## 2023-09-23 LAB — RESP PANEL BY RT-PCR (RSV, FLU A&B, COVID)  RVPGX2
Influenza A by PCR: NEGATIVE
Influenza B by PCR: NEGATIVE
Resp Syncytial Virus by PCR: NEGATIVE
SARS Coronavirus 2 by RT PCR: NEGATIVE

## 2023-09-23 LAB — LIPASE, BLOOD: Lipase: 36 U/L (ref 11–51)

## 2023-09-23 LAB — CK: Total CK: 39 U/L — ABNORMAL LOW (ref 49–397)

## 2023-09-23 LAB — LACTIC ACID, PLASMA: Lactic Acid, Venous: 1.7 mmol/L (ref 0.5–1.9)

## 2023-09-23 MED ORDER — ACETAMINOPHEN 325 MG PO TABS
650.0000 mg | ORAL_TABLET | Freq: Four times a day (QID) | ORAL | Status: DC | PRN
  Administered 2023-09-24: 650 mg via ORAL
  Filled 2023-09-23: qty 2

## 2023-09-23 MED ORDER — DILTIAZEM HCL ER COATED BEADS 120 MG PO CP24
120.0000 mg | ORAL_CAPSULE | Freq: Every day | ORAL | Status: DC
  Administered 2023-09-23 – 2023-09-24 (×2): 120 mg via ORAL
  Filled 2023-09-23 (×3): qty 1

## 2023-09-23 MED ORDER — OXYBUTYNIN CHLORIDE 5 MG PO TABS
5.0000 mg | ORAL_TABLET | Freq: Every evening | ORAL | Status: DC
  Administered 2023-09-23 – 2023-09-30 (×8): 5 mg via ORAL
  Filled 2023-09-23 (×10): qty 1

## 2023-09-23 MED ORDER — APIXABAN 5 MG PO TABS
5.0000 mg | ORAL_TABLET | Freq: Two times a day (BID) | ORAL | Status: DC
  Administered 2023-09-23 – 2023-09-30 (×15): 5 mg via ORAL
  Filled 2023-09-23 (×3): qty 1
  Filled 2023-09-23: qty 2
  Filled 2023-09-23 (×11): qty 1

## 2023-09-23 MED ORDER — PANTOPRAZOLE SODIUM 40 MG PO TBEC: 40.0000 mg | DELAYED_RELEASE_TABLET | Freq: Every day | ORAL | Status: DC | PRN

## 2023-09-23 MED ORDER — INSULIN ASPART 100 UNIT/ML IJ SOLN: 0.0000 [IU] | Freq: Every day | INTRAMUSCULAR | Status: DC

## 2023-09-23 MED ORDER — LOSARTAN POTASSIUM 50 MG PO TABS
50.0000 mg | ORAL_TABLET | Freq: Every day | ORAL | Status: DC
  Administered 2023-09-23 – 2023-09-30 (×8): 50 mg via ORAL
  Filled 2023-09-23 (×8): qty 1

## 2023-09-23 MED ORDER — ONDANSETRON HCL 4 MG PO TABS: 4.0000 mg | ORAL_TABLET | Freq: Four times a day (QID) | ORAL | Status: DC | PRN

## 2023-09-23 MED ORDER — MELATONIN 3 MG PO TABS
10.0000 mg | ORAL_TABLET | Freq: Every day | ORAL | Status: DC
  Administered 2023-09-25 – 2023-09-29 (×5): 10.5 mg via ORAL
  Filled 2023-09-23: qty 4
  Filled 2023-09-23: qty 2
  Filled 2023-09-23 (×2): qty 4
  Filled 2023-09-23: qty 2
  Filled 2023-09-23: qty 4
  Filled 2023-09-23: qty 2
  Filled 2023-09-23: qty 4

## 2023-09-23 MED ORDER — ACETAMINOPHEN 325 MG PO TABS
650.0000 mg | ORAL_TABLET | ORAL | Status: AC
  Administered 2023-09-23: 650 mg via ORAL
  Filled 2023-09-23: qty 2

## 2023-09-23 MED ORDER — METOPROLOL TARTRATE 25 MG PO TABS
50.0000 mg | ORAL_TABLET | Freq: Every day | ORAL | Status: DC
  Administered 2023-09-23 – 2023-09-30 (×8): 50 mg via ORAL
  Filled 2023-09-23 (×8): qty 2

## 2023-09-23 MED ORDER — METOPROLOL TARTRATE 25 MG PO TABS
25.0000 mg | ORAL_TABLET | Freq: Every day | ORAL | Status: DC
  Administered 2023-09-23 – 2023-09-29 (×7): 25 mg via ORAL
  Filled 2023-09-23 (×7): qty 1

## 2023-09-23 MED ORDER — ACETAMINOPHEN 650 MG RE SUPP: 650.0000 mg | Freq: Four times a day (QID) | RECTAL | Status: DC | PRN

## 2023-09-23 MED ORDER — ONDANSETRON HCL 4 MG/2ML IJ SOLN: 4.0000 mg | Freq: Four times a day (QID) | INTRAMUSCULAR | Status: DC | PRN

## 2023-09-23 MED ORDER — METFORMIN HCL 500 MG PO TABS
500.0000 mg | ORAL_TABLET | Freq: Two times a day (BID) | ORAL | Status: DC
  Administered 2023-09-23: 500 mg via ORAL
  Filled 2023-09-23 (×2): qty 1

## 2023-09-23 MED ORDER — INSULIN ASPART 100 UNIT/ML IJ SOLN: 0.0000 [IU] | Freq: Three times a day (TID) | INTRAMUSCULAR | Status: DC

## 2023-09-23 MED ORDER — LACTATED RINGERS IV BOLUS
1000.0000 mL | Freq: Once | INTRAVENOUS | Status: AC
  Administered 2023-09-23: 1000 mL via INTRAVENOUS

## 2023-09-23 MED ORDER — SODIUM CHLORIDE 0.9 % IV SOLN
1.0000 g | Freq: Once | INTRAVENOUS | Status: AC
  Administered 2023-09-23: 1 g via INTRAVENOUS
  Filled 2023-09-23: qty 10

## 2023-09-23 MED ORDER — SODIUM CHLORIDE 0.9 % IV SOLN
2.0000 g | INTRAVENOUS | Status: AC
  Administered 2023-09-24 – 2023-09-29 (×6): 2 g via INTRAVENOUS
  Filled 2023-09-23 (×6): qty 20

## 2023-09-23 MED ORDER — DUTASTERIDE 0.5 MG PO CAPS
0.5000 mg | ORAL_CAPSULE | Freq: Every day | ORAL | Status: DC
  Administered 2023-09-23 – 2023-09-30 (×8): 0.5 mg via ORAL
  Filled 2023-09-23 (×9): qty 1

## 2023-09-23 MED ORDER — ORAL CARE MOUTH RINSE: 15.0000 mL | OROMUCOSAL | Status: DC | PRN

## 2023-09-23 NOTE — ED Provider Notes (Signed)
 [place] EMERGENCY DEPARTMENT AT [facility] Provider Note   CSN: [id number] Arrival date & time: 09/22/23  2321     History  Chief Complaint  Patient presents with   Failure To Thrive    Gary Carroll is a 78 y.o. male.  Patient presents to the emergency department for evaluation of weakness and inability to ambulate.  He comes to the emergency department by ambulance from home.  He lives alone.  EMS reports that the living conditions were quite bad.  Patient has not been able to ambulate for an unknown period of time, did have a fall today which prompted him to call EMS.  He reports that he is felt very cold, cannot get warm.       Home Medications Prior to Admission medications   Medication Sig Start Date End Date Taking? Authorizing Provider  acetaminophen  (TYLENOL ) 650 MG CR tablet Take 1,300 mg by mouth every 8 (eight) hours as needed for pain.    [provider]  apixaban  (ELIQUIS ) 5 MG TABS tablet Take 1 tablet (5 mg total) by mouth 2 (two) times daily. 08/24/15   Zilphia Hilt, Charyl Coppersmith, MD  diltiazem  (CARDIZEM  CD) 120 MG 24 hr capsule Take 1 capsule (120 mg total) by mouth daily. 01/18/23   Alissa April, MD  dutasteride  (AVODART ) 0.5 MG capsule Take 0.5 mg by mouth daily.    [provider]  losartan  (COZAAR ) 50 MG tablet Take 0.5 tablets (25 mg total) by mouth daily. Patient taking differently: Take 50 mg by mouth daily. 12/23/22   Justina Oman, MD  Magnesium  500 MG CAPS Take 500 mg by mouth daily.    [provider]  Melatonin 10 MG CAPS Take 10 mg by mouth at bedtime.    [provider]  metFORMIN  (GLUCOPHAGE ) 500 MG tablet Take 500 mg by mouth 2 (two) times daily with a meal.    [provider]  metoprolol  tartrate (LOPRESSOR ) 25 MG tablet Take 25-50 mg by mouth 2 (two) times daily. 50 mg am 25 mg pm 05/06/23   [provider]  metoprolol  tartrate (LOPRESSOR ) 50 MG tablet Take 1 tablet (50 mg  total) by mouth 2 (two) times daily. 08/25/23   Mason Sole, Pratik D, DO  Multiple Vitamins-Minerals (MULTIVITAL) tablet Take 1 tablet by mouth daily.    [provider]  Omega-3 Fatty Acids (FISH OIL) 1000 MG CAPS Take 2,000 mg by mouth daily.    [provider]  oxybutynin  (DITROPAN ) 5 MG tablet Take 5 mg by mouth every evening. 06/01/19   [provider]  pantoprazole  (PROTONIX ) 40 MG tablet Take 1 tablet (40 mg total) by mouth daily as needed (heartburn). 08/25/23   Mason Sole, Pratik D, DO  potassium chloride  (KLOR-CON ) 10 MEQ tablet Take 1 tablet (10 mEq total) by mouth daily. 02/06/23   Ardia Kraft, PA-C  rosuvastatin  (CRESTOR ) 20 MG tablet Take 20 mg by mouth daily.    [provider]  vitamin C  (ASCORBIC ACID ) 500 MG tablet Take 500 mg by mouth daily.    [provider]      Allergies    Diovan [valsartan] and Tagamet [cimetidine]    Review of Systems   Review of Systems  Physical Exam Updated Vital Signs BP 131/75   Pulse 91   Temp 98.4 F (36.9 C) (Oral)   Resp (!) 24   Ht 5\' 11"  (1.803 m)   Wt 112 kg   SpO2 94%   BMI  34.44 kg/m  Physical Exam Vitals and nursing note reviewed.  Constitutional:      General: He is not in acute distress.    Appearance: He is well-developed.  HENT:     Head: Normocephalic and atraumatic.     Mouth/Throat:     Mouth: Mucous membranes are moist.  Eyes:     General: Vision grossly intact. Gaze aligned appropriately.     Extraocular Movements: Extraocular movements intact.     Conjunctiva/sclera: Conjunctivae normal.  Cardiovascular:     Rate and Rhythm: Normal rate. Rhythm irregular.     Pulses: Normal pulses.     Heart sounds: Normal heart sounds, S1 normal and S2 normal. No murmur heard.    No friction rub. No gallop.  Pulmonary:     Effort: Pulmonary effort is normal. No respiratory distress.     Breath sounds: Normal breath sounds.  Abdominal:     Palpations: Abdomen is soft.      Tenderness: There is no abdominal tenderness. There is no guarding or rebound.     Hernia: No hernia is present.  Musculoskeletal:        General: No swelling.     Cervical back: Full passive range of motion without pain, normal range of motion and neck supple. No pain with movement, spinous process tenderness or muscular tenderness. Normal range of motion.     Right lower leg: Edema present.     Left lower leg: Edema present.  Skin:    General: Skin is warm and dry.     Capillary Refill: Capillary refill takes less than 2 seconds.     Findings: Rash (Severe fungal excoriations under abdominal roll and inguinal area) present. No ecchymosis, erythema, lesion or wound.  Neurological:     Mental Status: He is alert and oriented to person, place, and time.     GCS: GCS eye subscore is 4. GCS verbal subscore is 5. GCS motor subscore is 6.     Cranial Nerves: Cranial nerves 2-12 are intact.     Sensory: Sensation is intact.     Motor: Motor function is intact. No weakness or abnormal muscle tone.     Coordination: Coordination is intact.  Psychiatric:        Mood and Affect: Mood normal.        Speech: Speech normal.        Behavior: Behavior normal.     ED Results / Procedures / Treatments   Labs (all labs ordered are listed, but only abnormal results are displayed) Labs Reviewed  CBC WITH DIFFERENTIAL/PLATELET - Abnormal; Notable for the following components:      Result Value   RDW 15.9 (*)    Platelets 123 (*)    Lymphs Abs 0.5 (*)    All other components within normal limits  COMPREHENSIVE METABOLIC PANEL WITH GFR - Abnormal; Notable for the following components:   Glucose, Bld 120 (*)    Calcium  8.5 (*)    Albumin 3.0 (*)    AST 62 (*)    ALT 63 (*)    All other components within normal limits  CK - Abnormal; Notable for the following components:   Total CK 39 (*)    All other components within normal limits  URINALYSIS, W/ REFLEX TO CULTURE (INFECTION SUSPECTED) -  Abnormal; Notable for the following components:   Color, Urine AMBER (*)    APPearance CLOUDY (*)    Protein, ur 30 (*)    Nitrite POSITIVE (*)  Leukocytes,Ua LARGE (*)    Bacteria, UA RARE (*)    All other components within normal limits  RESP PANEL BY RT-PCR (RSV, FLU A&B, COVID)  RVPGX2  CULTURE, BLOOD (ROUTINE X 2)  CULTURE, BLOOD (ROUTINE X 2)  URINE CULTURE  LIPASE, BLOOD  LACTIC ACID, PLASMA    EKG None Atrial flutter at 96BPM Diffuse ST-T wave changes No sig change since last tracing  Radiology DG Pelvis Portable Result Date: 09/23/2023 CLINICAL DATA:  Fall EXAM: PORTABLE PELVIS 1-2 VIEWS COMPARISON:  None Available. FINDINGS: There is no evidence of pelvic fracture or diastasis. No pelvic bone lesions are seen. IMPRESSION: Negative. Electronically Signed   By: Janeece Mechanic M.D.   On: 09/23/2023 00:10   DG Chest Port 1 View Result Date: 09/23/2023 CLINICAL DATA:  Fall EXAM: PORTABLE CHEST 1 VIEW COMPARISON:  08/21/2023 FINDINGS: Prior aortic valve repair. Heart upper limits normal in size. Aortic atherosclerosis. No confluent opacities, effusions or pneumothorax. No acute bony abnormality. IMPRESSION: No active disease. Electronically Signed   By: Janeece Mechanic M.D.   On: 09/23/2023 00:10    Procedures Procedures    Medications Ordered in ED Medications  cefTRIAXone  (ROCEPHIN ) 1 g in sodium chloride  0.9 % 100 mL IVPB (has no administration in time range)  acetaminophen  (TYLENOL ) tablet 650 mg (650 mg Oral Given 09/23/23 0135)  lactated ringers  bolus 1,000 mL (0 mLs Intravenous Stopped 09/23/23 0414)    ED Course/ Medical Decision Making/ A&P                                 Medical Decision Making Amount and/or Complexity of Data Reviewed Labs: ordered. Radiology: ordered.  Risk OTC drugs.   Presents with generalized weakness.  Patient lives alone.  He reports that he has somebody come in 3 times a week to help him for several hours.  He felt the pain could not  get up.  He was down for an unknown period of time.  No obvious injuries.  Patient found to be febrile at arrival.  He is in atrial flutter which is chronic for him.  Patient with no leukocytosis, normal lactic acid.  No hypotension.  Fever treated with Tylenol .  Given IV fluid bolus.  Patient with severe excoriations of his lower abdomen, inguinal and scrotal area secondary to fungal infections.  He was covered in urine and stool at arrival.  Ultimately, patient found to have urinary tract infection which is likely adding to his generalized weakness and debility.  Cultures pending.  Review of his historical data does not show any resistant UTIs.  Given Rocephin .  Will ask hospitalist to admit for further management.        Final Clinical Impression(s) / ED Diagnoses Final diagnoses:  Generalized weakness  Urinary tract infection without hematuria, site unspecified    Rx / DC Orders ED Discharge Orders     None         Gerson Fauth, Marine Sia, MD 09/23/23 [phone]

## 2023-09-23 NOTE — Progress Notes (Signed)
   09/23/23 1136  TOC Brief Assessment  Insurance and Status Reviewed  Patient has primary care physician Yes  Home environment has been reviewed Home  Prior level of function: need assistance  Prior/Current Home Services Current home services (Aide)  Social Drivers of Health Review SDOH reviewed no interventions necessary  Readmission risk has been reviewed Yes  Transition of care needs no transition of care needs at this time   Patient admitted with Sepsis. Medical work up continues. [facility] notification Completed- [phone], TOC following.

## 2023-09-23 NOTE — Evaluation (Addendum)
 Physical Therapy Evaluation Patient Details Name: Gary Carroll MRN: [id number] DOB: 1946-03-30 Today's Date: 09/23/2023  History of Present Illness  Gary Carroll is a 78 y.o. male with medical history significant for severe AS status post TAVR, PAF on Eliquis , CAD, rectal adenocarcinoma, hypertension, dyslipidemia, type 2 diabetes, and chronic HFpEF who presented to the ED with weakness and inability to ambulate.  He was recently discharged to SNF on 4/8 with noted significant weakness at that time related to COVID-19 infection.  He was brought to ED via EMS who reported that his living conditions were quite bad and patient has been unable to ambulate well for a long period of time.  The call was made to EMS after he fell and he reported that he felt quite cold and could not get warm.   Clinical Impression  Patient agreeable to PT evaluation. Patient was a recent admit in early April. At that time, patient was mod A fro bed mobility and min-CGA for functional transfers. Patient then received some rehab at [facility] for a week and returned home. Patient reports at home, he's had a steady decline in function. On this date, patient demonstrates general weakness t/o, with 3+/4- MMT at best, tested in bed. Considerable LE swelling and hemosiderian staining noted. Patient is max Ax1 for rolling. Max A for attempt to transfer supine to sit. Patient will benefit from continued skilled physical therapy acutely and in recommended venue in order to address strength, and endurance deficits in order to return home safely and improve QOL.        If plan is discharge home, recommend the following: A lot of help with walking and/or transfers;A lot of help with bathing/dressing/bathroom;Assistance with cooking/housework;Assist for transportation;Help with stairs or ramp for entrance   Can travel by private vehicle   No    Equipment Recommendations None recommended by PT  Recommendations for Other  Services       Functional Status Assessment Patient has had a recent decline in their functional status and demonstrates the ability to make significant improvements in function in a reasonable and predictable amount of time.     Precautions / Restrictions Precautions Precautions: Fall Restrictions Weight Bearing Restrictions Per Provider Order: No      Mobility  Bed Mobility Overal bed mobility: Needs Assistance Bed Mobility: Rolling, Supine to Sit Rolling: Max assist, Used rails   Supine to sit: Max assist, Used rails     General bed mobility comments: Max assist needed for rolling R and L along with pt using bed rails during pericare. Attempt to transfer supine to sit unsuccessful 2/2 pt reporting inc weakness/fatigue. Max A needed to get LEs to EOB.    Transfers       General transfer comment: Unable to assess safely 2/2 patient reported weakness/fatigue    Ambulation/Gait       General Gait Details: Unable to assess safely 2/2 patient reported weakness/fatigue  Stairs    Wheelchair Mobility     Tilt Bed    Modified Rankin (Stroke Patients Only)       Balance Overall balance assessment:  (Unable to assess safely 2/2 patient reported weakness/fatigue)           Pertinent Vitals/Pain Pain Assessment Pain Assessment: No/denies pain    Home Living Family/patient expects to be discharged to:: Private residence Living Arrangements: Alone Available Help at Discharge: Friend(s);Available PRN/intermittently Type of Home: Apartment Home Access: Stairs to enter Entrance Stairs-Rails: Right;Left Entrance Stairs-Number of Steps: 4  Home Layout: One level Home Equipment: Agricultural consultant (2 wheels);BSC/3in1;Shower seat      Prior Function Prior Level of Function : Independent/Modified Independent     Mobility Comments: Pt reports previous household ambulator but since returning home after SNF stay, has had a steady decline. Has not been able to  tramsfer or ambulate ADLs Comments: Reports as previously (I) but unable to perform ADLs at home since SNF discharge     Extremity/Trunk Assessment   Upper Extremity Assessment Upper Extremity Assessment: Defer to OT evaluation    Lower Extremity Assessment Lower Extremity Assessment: Generalized weakness (3+ at best for BLE MMT. Inc difficulty with antigravity motions of BLE in bed. Assist needed to get LEs to EOB.)    Cervical / Trunk Assessment Cervical / Trunk Assessment:  (Unable to assess)  Communication   Communication Communication: No apparent difficulties    Cognition Arousal: Alert Behavior During Therapy: WFL for tasks assessed/performed   PT - Cognitive impairments: No apparent impairments       Following commands: Intact       Cueing Cueing Techniques: Verbal cues     General Comments General comments (skin integrity, edema, etc.): Considerable LE swelling and hemosiderian staining    Exercises     Assessment/Plan    PT Assessment Patient needs continued PT services;All further PT needs can be met in the next venue of care  PT Problem List Decreased strength;Decreased range of motion;Decreased activity tolerance;Decreased balance;Decreased mobility;Decreased coordination       PT Treatment Interventions DME instruction;Gait training;Stair training;Functional mobility training;Therapeutic activities;Therapeutic exercise;Balance training;Patient/family education    PT Goals (Current goals can be found in the Care Plan section)  Acute Rehab PT Goals Patient Stated Goal: Return to SNF for rehab prior to returning home PT Goal Formulation: With patient Time For Goal Achievement: 09/30/23 Potential to Achieve Goals: Good    Frequency Min 3X/week     Co-evaluation               AM-PAC PT "6 Clicks" Mobility  Outcome Measure Help needed turning from your back to your side while in a flat bed without using bedrails?: A Lot Help needed moving  from lying on your back to sitting on the side of a flat bed without using bedrails?: A Lot Help needed moving to and from a bed to a chair (including a wheelchair)?: A Lot Help needed standing up from a chair using your arms (e.g., wheelchair or bedside chair)?: A Lot Help needed to walk in hospital room?: A Lot Help needed climbing 3-5 steps with a railing? : Total 6 Click Score: 11    End of Session   Activity Tolerance: Patient limited by fatigue Patient left: in bed;with call bell/phone within reach   PT Visit Diagnosis: Muscle weakness (generalized) (M62.81);Difficulty in walking, not elsewhere classified (R26.2);Other abnormalities of gait and mobility (R26.89)    Time: 2952-8413 PT Time Calculation (min) (ACUTE ONLY): 17 min   Charges:   PT Evaluation $PT Eval Low Complexity: 1 Low PT Treatments $Therapeutic Activity: 8-22 mins PT General Charges $$ ACUTE PT VISIT: 1 Visit        2:47 PM, 09/23/23 Marshell Dilauro Powell-Butler, PT, DPT [place] with [facility]

## 2023-09-23 NOTE — Plan of Care (Signed)
  Problem: Acute Rehab PT Goals(only PT should resolve) Goal: Pt Will Go Supine/Side To Sit Outcome: Progressing Flowsheets (Taken 09/23/2023 1449) Pt will go Supine/Side to Sit: with minimal assist Goal: Patient Will Perform Sitting Balance Outcome: Progressing Flowsheets (Taken 09/23/2023 1449) Patient will perform sitting balance: with minimal assist Goal: Patient Will Transfer Sit To/From Stand Outcome: Progressing Flowsheets (Taken 09/23/2023 1449) Patient will transfer sit to/from stand: with minimal assist Goal: Pt Will Transfer Bed To Chair/Chair To Bed Outcome: Progressing Flowsheets (Taken 09/23/2023 1449) Pt will Transfer Bed to Chair/Chair to Bed: with min assist Goal: Pt Will Ambulate Outcome: Progressing Flowsheets (Taken 09/23/2023 1449) Pt will Ambulate:  10 feet  with minimal assist  with rolling walker   2:49 PM, 09/23/23 Marysue Sola, PT, DPT [place] with [facility]

## 2023-09-23 NOTE — H&P (Signed)
 History and Physical    Gary Carroll ZOX:[id number] DOB: [birth year] DOA: 09/22/2023  PCP: [facility]   Patient coming from: Home  Chief Complaint: Weakness with fall  HPI: Gary Carroll is a 78 y.o. male with medical history significant for severe AS status post TAVR, PAF on Eliquis , CAD, rectal adenocarcinoma, hypertension, dyslipidemia, type 2 diabetes, and chronic HFpEF who presented to the ED with weakness and inability to ambulate.  He was recently discharged to SNF on 4/8 with noted significant weakness at that time related to COVID-19 infection.  He was brought to ED via EMS who reported that his living conditions were quite bad and patient has been unable to ambulate well for a long period of time.  The call was made to EMS after he fell and he reported that he felt quite cold and could not get warm.   ED Course: Initial temperature 101.4 Fahrenheit with pulse rate of 101 and blood pressure stable.  Platelet count of 123,000 and other labs within normal limits.  Urine analysis suggesting UTI and patient was given 1 L fluid bolus as well as Rocephin .  Blood and urine cultures have been ordered and pending.  Review of Systems: Reviewed as noted above, otherwise negative.  Past Medical History:  Diagnosis Date   A-fib [facility])    Allergic rhinitis    Arthritis    Asthma    seasonal per pt   Bilateral knee pain    OA   CAD (coronary artery disease)    Diabetes mellitus without complication (HCC)    Diverticulosis 02/2012   seen on colonoscopy   Erectile dysfunction    GERD (gastroesophageal reflux disease)    Hiatal hernia    HLD (hyperlipidemia)    HTN (hypertension)    Hydronephrosis determined by ultrasound 08/22/2015   Overactive bladder    Prediabetes    Pulmonary lesion    Ringing of ears    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 Arlester Ladd and Dr. Sherene Dilling   Severe aortic stenosis    Sleep  apnea    Urinary frequency    Urinary urgency    Wears glasses     Past Surgical History:  Procedure Laterality Date   ABDOMINAL AORTOGRAM W/LOWER EXTREMITY N/A 02/05/2023   Procedure: ABDOMINAL AORTOGRAM W/LOWER EXTREMITY;  Surgeon: Kyra Phy, MD;  Location: [facility] INVASIVE CV LAB;  Service: Cardiovascular;  Laterality: N/A;   CATARACT EXTRACTION W/PHACO Left 12/07/2021   Procedure: CATARACT EXTRACTION PHACO AND INTRAOCULAR LENS PLACEMENT (IOC);  Surgeon: Tarri Farm, MD;  Location: [facility] ORS;  Service: Ophthalmology;  Laterality: Left;  CDE 7.75   CATARACT EXTRACTION W/PHACO Right 12/21/2021   Procedure: CATARACT EXTRACTION PHACO AND INTRAOCULAR LENS PLACEMENT (IOC);  Surgeon: Tarri Farm, MD;  Location: [facility] ORS;  Service: Ophthalmology;  Laterality: Right;  CDE: 8.00   CHOLECYSTECTOMY     COLONOSCOPY  02/2012   Dr. Arvie Latus: sigmoid, transverse, and ascending colon diverticulosis   COLONOSCOPY WITH PROPOFOL  N/A 01/13/2023   Procedure: COLONOSCOPY WITH PROPOFOL ;  Surgeon: Vinetta Greening, DO;  Location: [facility] ENDO SUITE;  Service: Endoscopy;  Laterality: N/A;  1:00 pm, asa 3   FINGER SURGERY Right    INTRAOPERATIVE TRANSTHORACIC ECHOCARDIOGRAM N/A 02/25/2023   Procedure: INTRAOPERATIVE TRANSTHORACIC ECHOCARDIOGRAM;  Surgeon: Arnoldo Lapping, MD;  Location: [facility] INVASIVE CV LAB;  Service: Open Heart Surgery;  Laterality: N/A;   prostate procedure  2017   per patient, was  having trouble urinating    RIGHT HEART CATH AND CORONARY ANGIOGRAPHY N/A 02/05/2023   Procedure: RIGHT HEART CATH AND CORONARY ANGIOGRAPHY;  Surgeon: Kyra Phy, MD;  Location: [facility] INVASIVE CV LAB;  Service: Cardiovascular;  Laterality: N/A;   TONSILLECTOMY     TOTAL KNEE ARTHROPLASTY  2012   left (Dr. Macky Sayres)   TOTAL KNEE ARTHROPLASTY Right 06/19/2015   Procedure: TOTAL KNEE ARTHROPLASTY;  Surgeon: Christie Cox, MD;  Location: [facility] OR;  Service: Orthopedics;  Laterality: Right;   TRANSCATHETER AORTIC VALVE REPLACEMENT,  TRANSFEMORAL N/A 02/25/2023   Procedure: Transcatheter Aortic Valve Replacement, Transfemoral;  Surgeon: Arnoldo Lapping, MD;  [facility] INVASIVE CV LAB;  Service: Open Heart Surgery;  Laterality: N/A;     reports that he quit smoking about 13 years ago. His smoking use included cigarettes. He has never used smokeless tobacco. He reports that he does not currently use alcohol after a past usage of about 1.0 - 2.0 standard drink of alcohol per week. He reports that he does not use drugs.  Allergies  Allergen Reactions   Diovan [Valsartan] Cough   Tagamet [Cimetidine] Other (See Comments)    Irritability     Family History  Problem Relation Age of Onset   Uterine cancer Mother    Cancer Mother        uterine   Coronary artery disease Father    Coronary artery disease Paternal Grandfather    Heart disease Paternal Grandfather    Colon cancer Neg Hx    Esophageal cancer Neg Hx    Stomach cancer Neg Hx    Rectal cancer Neg Hx    Liver disease Neg Hx     Prior to Admission medications   Medication Sig Start Date End Date Taking? Authorizing Provider  acetaminophen  (TYLENOL ) 650 MG CR tablet Take 1,300 mg by mouth every 8 (eight) hours as needed for pain.    [provider]  apixaban  (ELIQUIS ) 5 MG TABS tablet Take 1 tablet (5 mg total) by mouth 2 (two) times daily. 08/24/15   Zilphia Hilt, Charyl Coppersmith, MD  diltiazem  (CARDIZEM  CD) 120 MG 24 hr capsule Take 1 capsule (120 mg total) by mouth daily. 01/18/23   Alissa April, MD  dutasteride  (AVODART ) 0.5 MG capsule Take 0.5 mg by mouth daily.    [provider]  losartan  (COZAAR ) 50 MG tablet Take 0.5 tablets (25 mg total) by mouth daily. Patient taking differently: Take 50 mg by mouth daily. 12/23/22   Justina Oman, MD  Magnesium  500 MG CAPS Take 500 mg by mouth daily.    [provider]  Melatonin 10 MG CAPS Take 10 mg by mouth at bedtime.    [provider]  metFORMIN  (GLUCOPHAGE ) 500 MG tablet Take  500 mg by mouth 2 (two) times daily with a meal.    [provider]  metoprolol  tartrate (LOPRESSOR ) 25 MG tablet Take 25-50 mg by mouth 2 (two) times daily. 50 mg am 25 mg pm 05/06/23   [provider]  metoprolol  tartrate (LOPRESSOR ) 50 MG tablet Take 1 tablet (50 mg total) by mouth 2 (two) times daily. 08/25/23   Mason Sole, Suede Greenawalt D, DO  Multiple Vitamins-Minerals (MULTIVITAL) tablet Take 1 tablet by mouth daily.    [provider]  Omega-3 Fatty Acids (FISH OIL) 1000 MG CAPS Take 2,000 mg by mouth daily.    [provider]  oxybutynin  (DITROPAN ) 5 MG tablet Take 5 mg by mouth every evening. 06/01/19   [provider]  pantoprazole  (PROTONIX ) 40 MG tablet Take 1 tablet (40 mg total) by mouth daily as needed (heartburn). 08/25/23   Mason Sole, Shady Padron D, DO  potassium chloride  (KLOR-CON ) 10 MEQ tablet Take 1 tablet (10 mEq total) by mouth daily. 02/06/23   Ardia Kraft, PA-C  rosuvastatin  (CRESTOR ) 20 MG tablet Take 20 mg by mouth daily.    [provider]  vitamin C  (ASCORBIC ACID ) 500 MG tablet Take 500 mg by mouth daily.    [provider]    Physical Exam: Vitals:   09/23/23 0615 09/23/23 0630 09/23/23 0645 09/23/23 0730  BP: 138/76 125/88 138/73   Pulse: 89 99 (!) 101 78  Resp: (!) 22 (!) 23 13 (!) 23  Temp:      TempSrc:      SpO2: 96% 95% 96% 95%  Weight:      Height:        Constitutional: NAD, calm, comfortable Vitals:   09/23/23 0615 09/23/23 0630 09/23/23 0645 09/23/23 0730  BP: 138/76 125/88 138/73   Pulse: 89 99 (!) 101 78  Resp: (!) 22 (!) 23 13 (!) 23  Temp:      TempSrc:      SpO2: 96% 95% 96% 95%  Weight:      Height:       Eyes: lids and conjunctivae normal Neck: normal, supple Respiratory: clear to auscultation bilaterally. Normal respiratory effort. No accessory muscle use.  Cardiovascular: Regular rate and rhythm, no murmurs. Abdomen: no tenderness, no distention. Bowel sounds positive.   Musculoskeletal:  No edema. Skin: no rashes, lesions, ulcers.  Psychiatric: Flat affect  Labs on Admission: I have personally reviewed following labs and imaging studies  CBC: Recent Labs  Lab 09/23/23 0042  WBC 5.4  NEUTROABS 4.4  HGB 13.0  HCT 39.8  MCV 91.9  PLT 123*   Basic Metabolic Panel: Recent Labs  Lab 09/23/23 0042  NA 137  K 4.3  CL 105  CO2 23  GLUCOSE 120*  BUN 13  CREATININE 0.80  CALCIUM  8.5*   GFR: Estimated Creatinine Clearance: 96.9 mL/min (by C-G formula based on SCr of 0.8 mg/dL). Liver Function Tests: Recent Labs  Lab 09/23/23 0042  AST 62*  ALT 63*  ALKPHOS 66  BILITOT 0.5  PROT 6.6  ALBUMIN 3.0*   Recent Labs  Lab 09/23/23 0042  LIPASE 36   No results for input(s): "AMMONIA" in the last 168 hours. Coagulation Profile: No results for input(s): "INR", "PROTIME" in the last 168 hours. Cardiac Enzymes: Recent Labs  Lab 09/23/23 0042  CKTOTAL 39*   BNP (last 3 results) No results for input(s): "PROBNP" in the last 8760 hours. HbA1C: No results for input(s): "HGBA1C" in the last 72 hours. CBG: No results for input(s): "GLUCAP" in the last 168 hours. Lipid Profile: No results for input(s): "CHOL", "HDL", "LDLCALC", "TRIG", "CHOLHDL", "LDLDIRECT" in the last 72 hours. Thyroid Function Tests: No results for input(s): "TSH", "T4TOTAL", "FREET4", "T3FREE", "THYROIDAB" in the last 72 hours. Anemia Panel: No results for input(s): "VITAMINB12", "FOLATE", "FERRITIN", "TIBC", "IRON", "RETICCTPCT" in the last 72 hours. Urine analysis:    Component Value Date/Time   COLORURINE AMBER (A) 09/23/2023 0414   APPEARANCEUR CLOUDY (A) 09/23/2023 0414   LABSPEC 1.019 09/23/2023 0414   PHURINE 6.0 09/23/2023 0414   GLUCOSEU NEGATIVE 09/23/2023 0414   HGBUR NEGATIVE 09/23/2023 0414   BILIRUBINUR NEGATIVE 09/23/2023 0414   BILIRUBINUR neg 08/31/2012 1203   KETONESUR NEGATIVE 09/23/2023 0414   PROTEINUR 30 (  A) 09/23/2023 0414   UROBILINOGEN  negative 08/31/2012 1203   UROBILINOGEN 0.2 05/31/2010 1125   NITRITE POSITIVE (A) 09/23/2023 0414   LEUKOCYTESUR LARGE (A) 09/23/2023 0414    Radiological Exams on Admission: DG Pelvis Portable Result Date: 09/23/2023 CLINICAL DATA:  Fall EXAM: PORTABLE PELVIS 1-2 VIEWS COMPARISON:  None Available. FINDINGS: There is no evidence of pelvic fracture or diastasis. No pelvic bone lesions are seen. IMPRESSION: Negative. Electronically Signed   By: Janeece Mechanic M.D.   On: 09/23/2023 00:10   DG Chest Port 1 View Result Date: 09/23/2023 CLINICAL DATA:  Fall EXAM: PORTABLE CHEST 1 VIEW COMPARISON:  08/21/2023 FINDINGS: Prior aortic valve repair. Heart upper limits normal in size. Aortic atherosclerosis. No confluent opacities, effusions or pneumothorax. No acute bony abnormality. IMPRESSION: No active disease. Electronically Signed   By: Janeece Mechanic M.D.   On: 09/23/2023 00:10    EKG: Independently reviewed.  Atrial flutter 96 bpm.  Assessment/Plan Principal Problem:   Sepsis secondary to UTI [facility]) Active Problems:   Essential (primary) hypertension   Mixed hyperlipidemia   Benign prostatic hyperplasia with urinary obstruction   Obstructive sleep apnea syndrome   Aortic stenosis   CAD (coronary artery disease)   (HFpEF) heart failure with preserved ejection fraction (HCC)   Type 2 diabetes mellitus (HCC)   Chronic obstructive pulmonary disease (HCC)    Sepsis, POA, secondary to UTI - Monitor ongoing cultures with no growth noted on blood cultures thus far - Continue Rocephin  empirically - Avoid IV fluid for now given no significant elevation in lactic acidosis and history of HFpEF - With generalized weakness associated, will reassess with PT/OT and place on fall precautions  PAF/flutter -Currently stable on telemetry -Continue home diltiazem  and metoprolol  -Eliquis  for anticoagulation  Mild transaminitis -Likely related to sepsis physiology, continue to trend and monitor -Holding  statin for now   OSA -On CPAP   Hypertension -Continue home medications   Dyslipidemia - Hold statin given mild transaminitis   Chronic HFpEF -Currently appears euvolemic and given IV fluid bolus in ED, avoid further IV fluid for now - Hold Lasix  for now given sepsis physiology and plan to resume probably by 5/7   Type 2 diabetes -Currently without hyperglycemia -Continue metformin  -Carb diet -SSI   Rectal adenocarcinoma -Follows with oncologist at [facility] and currently not on any further chemotherapy or radiation -Apparently not a candidate for surgery -Overall prognosis appears poor -Appreciate palliative consultation for goals of care discussion especially in light of readmission and what appears to be a failure to thrive picture   History of severe AS status post TAVR -Successful TAVR with 26 mm Edwards SAPIEN 3 THV on 02/25/2023   Obesity, class I -BMI 34.44   DVT prophylaxis: Eliquis  Code Status: Full Family Communication: None at bedside Disposition Plan: Admit for treatment of sepsis/UTI with IV fluids and medications Consults called: Palliative care Admission status: Inpt, MedSurg  Severity of Illness: The appropriate patient status for this patient is INPATIENT. Inpatient status is judged to be reasonable and necessary in order to provide the required intensity of service to ensure the patient's safety. The patient's presenting symptoms, physical exam findings, and initial radiographic and laboratory data in the context of their chronic comorbidities is felt to place them at high risk for further clinical deterioration. Furthermore, it is not anticipated that the patient will be medically stable for discharge from the hospital within 2 midnights of admission.   * I certify that at the point of admission  it is my clinical judgment that the patient will require inpatient hospital care spanning beyond 2 midnights from the point of admission due to high intensity of  service, high risk for further deterioration and high frequency of surveillance required.*   Lili Harts D Siedah Sedor DO [facility]  If 7PM-7AM, please contact night-coverage [email or web address]  09/23/2023, 7:55 AM

## 2023-09-24 ENCOUNTER — Encounter (HOSPITAL_COMMUNITY): Payer: Self-pay | Admitting: Internal Medicine

## 2023-09-24 DIAGNOSIS — Z7189 Other specified counseling: Secondary | ICD-10-CM | POA: Diagnosis not present

## 2023-09-24 DIAGNOSIS — J449 Chronic obstructive pulmonary disease, unspecified: Secondary | ICD-10-CM | POA: Diagnosis not present

## 2023-09-24 DIAGNOSIS — E782 Mixed hyperlipidemia: Secondary | ICD-10-CM | POA: Diagnosis not present

## 2023-09-24 DIAGNOSIS — I5032 Chronic diastolic (congestive) heart failure: Secondary | ICD-10-CM

## 2023-09-24 DIAGNOSIS — N401 Enlarged prostate with lower urinary tract symptoms: Secondary | ICD-10-CM

## 2023-09-24 DIAGNOSIS — Z515 Encounter for palliative care: Secondary | ICD-10-CM | POA: Diagnosis not present

## 2023-09-24 DIAGNOSIS — N390 Urinary tract infection, site not specified: Secondary | ICD-10-CM | POA: Diagnosis not present

## 2023-09-24 DIAGNOSIS — A419 Sepsis, unspecified organism: Secondary | ICD-10-CM | POA: Diagnosis not present

## 2023-09-24 DIAGNOSIS — N138 Other obstructive and reflux uropathy: Secondary | ICD-10-CM

## 2023-09-24 LAB — CBC
HCT: 37.9 % — ABNORMAL LOW (ref 39.0–52.0)
Hemoglobin: 12.2 g/dL — ABNORMAL LOW (ref 13.0–17.0)
MCH: 28.8 pg (ref 26.0–34.0)
MCHC: 32.2 g/dL (ref 30.0–36.0)
MCV: 89.6 fL (ref 80.0–100.0)
Platelets: 124 10*3/uL — ABNORMAL LOW (ref 150–400)
RBC: 4.23 MIL/uL (ref 4.22–5.81)
RDW: 16.1 % — ABNORMAL HIGH (ref 11.5–15.5)
WBC: 5 10*3/uL (ref 4.0–10.5)
nRBC: 0 % (ref 0.0–0.2)

## 2023-09-24 LAB — COMPREHENSIVE METABOLIC PANEL WITH GFR
ALT: 56 U/L — ABNORMAL HIGH (ref 0–44)
AST: 58 U/L — ABNORMAL HIGH (ref 15–41)
Albumin: 2.7 g/dL — ABNORMAL LOW (ref 3.5–5.0)
Alkaline Phosphatase: 61 U/L (ref 38–126)
Anion gap: 7 (ref 5–15)
BUN: 10 mg/dL (ref 8–23)
CO2: 22 mmol/L (ref 22–32)
Calcium: 8.4 mg/dL — ABNORMAL LOW (ref 8.9–10.3)
Chloride: 103 mmol/L (ref 98–111)
Creatinine, Ser: 0.78 mg/dL (ref 0.61–1.24)
GFR, Estimated: >60 mL/min (ref >60)
Glucose, Bld: 96 mg/dL (ref 70–99)
Potassium: 3.9 mmol/L (ref 3.5–5.1)
Sodium: 132 mmol/L — ABNORMAL LOW (ref 135–145)
Total Bilirubin: 0.6 mg/dL (ref 0.0–1.2)
Total Protein: 6.1 g/dL — ABNORMAL LOW (ref 6.5–8.1)

## 2023-09-24 LAB — GLUCOSE, CAPILLARY
Glucose-Capillary: 100 mg/dL — ABNORMAL HIGH (ref 70–99)
Glucose-Capillary: 105 mg/dL — ABNORMAL HIGH (ref 70–99)
Glucose-Capillary: 167 mg/dL — ABNORMAL HIGH (ref 70–99)
Glucose-Capillary: 119 mg/dL — ABNORMAL HIGH (ref 70–99)

## 2023-09-24 LAB — MAGNESIUM: Magnesium: 1.8 mg/dL (ref 1.7–2.4)

## 2023-09-24 MED ORDER — FOSFOMYCIN TROMETHAMINE 3 G PO PACK
3.0000 g | PACK | Freq: Once | ORAL | Status: AC
  Administered 2023-09-24: 3 g via ORAL
  Filled 2023-09-24: qty 3

## 2023-09-24 NOTE — TOC Initial Note (Signed)
 Transition of Care [facility]) - Initial/Assessment Note    Patient Details  Name: Gary Carroll MRN: [id number] Date of Birth: 20-Apr-1946  [facility]) CM/SW Contact:    Orelia Binet, RN Phone Number: 09/24/2023, 2:45 PM  Clinical Narrative       Patient admitted with sepsis. PT is recommending SNF. Palliative consulted, Patient recently discharged from [facility]. He states he is to weak to go home. He is agreeable to SNF but wants a different facility.  He is aware he will be in copay days. FL2 completed and sent out for bed offers.    Expected Discharge Plan: Skilled Nursing Facility Barriers to Discharge: Continued Medical Work up   Patient Goals and CMS Choice Patient states their goals for this hospitalization and ongoing recovery are:: agreeable to SNF CMS Medicare.gov Compare Post Acute Care list provided to:: Patient    Expected Discharge Plan and Services      Living arrangements for the past 2 months: Apartment           Prior Living Arrangements/Services Living arrangements for the past 2 months: Apartment Lives with:: Self Patient language and need for interpreter reviewed:: Yes        Need for Family Participation in Patient Care: Yes (Comment) Care giver support system in place?: No (comment) Current home services: DME Criminal Activity/Legal Involvement Pertinent to Current Situation/Hospitalization: No - Comment as needed  Activities of Daily Living   ADL Screening (condition at time of admission) Independently performs ADLs?: No Does the patient have a NEW difficulty with bathing/dressing/toileting/self-feeding that is expected to last >3 days?: Yes (Initiates electronic notice to provider for possible OT consult) Does the patient have a NEW difficulty with getting in/out of bed, walking, or climbing stairs that is expected to last >3 days?: Yes (Initiates electronic notice to provider for possible PT consult) Does the patient have a  NEW difficulty with communication that is expected to last >3 days?: No Is the patient deaf or have difficulty hearing?: No Does the patient have difficulty seeing, even when wearing glasses/contacts?: No Does the patient have difficulty concentrating, remembering, or making decisions?: No  Permission Sought/Granted                  Emotional Assessment       Orientation: : Oriented to Self, Oriented to Place, Oriented to  Time, Oriented to Situation Alcohol / Substance Use: Not Applicable Psych Involvement: No (comment)  Admission diagnosis:  Generalized weakness [R53.1] Sepsis secondary to UTI (HCC) [A41.9, N39.0] Urinary tract infection without hematuria, site unspecified [N39.0] Patient Active Problem List   Diagnosis Date Noted   Sepsis secondary to UTI (HCC) 09/23/2023   COVID-19 08/21/2023   Diarrhea 03/20/2023   Transportation insecurity 03/07/2023   Presence of intraocular lens 03/07/2023   Pain in left shoulder 03/07/2023   Encounter for screening for malignant neoplasm of respiratory organs 03/07/2023   Other secondary cataract, right eye 03/07/2023   Other nonspecific abnormal finding of lung field 03/07/2023   Aortic valve disorder 03/07/2023   Osteopenia 03/07/2023   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 with unspecified severity (HCC) 03/07/2023   Need for assistance with personal care 03/07/2023   Hyperglycemia, unspecified 03/07/2023   Edema, unspecified 03/07/2023   Disease of anus and rectum, unspecified 03/07/2023   Chronic venous insufficiency 03/07/2023   Bilateral ankle joint pain 03/07/2023   Age-related nuclear cataract, bilateral 03/07/2023   CAD (coronary artery disease) 02/25/2023   (  HFpEF) heart failure with preserved ejection fraction (HCC) 02/25/2023   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   Acute hypoxemic respiratory failure (HCC)  01/27/2023   Nonrheumatic aortic valve stenosis 01/27/2023   Primary adenocarcinoma of colon (HCC) 01/27/2023   Insomnia 01/27/2023   Acute heart failure with preserved ejection fraction (HFpEF) (HCC) 01/20/2023   SOB (shortness of breath) 12/23/2022   Acute respiratory failure with hypoxia (HCC) 12/21/2022   Elevated troponin 12/21/2022   Rectal pain 12/04/2022   Rectal bleed 12/04/2022   Hemorrhoids 08/29/2022   Chronic constipation 06/24/2022   Occult blood in stools 06/24/2022   CAP (community acquired pneumonia) 06/03/2022   Urethritis 05/01/2022   Candidiasis of penis 05/01/2022   Spasm 04/29/2022   Hardening of the aorta (main artery of the heart) (HCC) 04/29/2022   Complication due to secondary diabetes mellitus (HCC) 04/29/2022   Chronic obstructive pulmonary disease (HCC) 04/29/2022   Obstructive sleep apnea (adult) (pediatric) 11/29/2021   Potassium disorder 11/26/2021   Aortic valve stenosis 10/18/2021   Pressure injury of sacral region, stage 1 10/18/2021   Bilateral lower extremity edema 10/18/2021   Acute urinary tract infection 09/29/2021   Dysuria 09/29/2021   Blister 08/17/2021   Upper respiratory infection 07/05/2021   Cough 07/05/2021   Restrictive lung disease 06/26/2021   Multiple nodules of lung 04/30/2021   Otalgia of left ear 04/04/2021   Acute otitis media 04/04/2021   Tinea cruris 03/19/2021   Hard stool 03/19/2021   Chronic anticoagulation 03/14/2021   Aortic stenosis 03/14/2021   Pressure ulcer of buttock 02/03/2021   Acute respiratory failure with hypoxia and hypercapnia (HCC)    Abnormal electron beam computed tomography 01/01/2021   Abnormal CT of the chest 10/23/2020   History of tobacco use 10/23/2020   Paroxysmal atrial fibrillation (HCC) 09/25/2020   Type 2 diabetes mellitus (HCC) 09/25/2020   Senile osteoporosis 09/25/2020   Retention of urine 09/25/2020   Morbid obesity (HCC) 09/25/2020   Chronic combined systolic and diastolic  heart failure (HCC) 40/02/2724   Atherosclerosis of coronary artery without angina pectoris 09/25/2020   Lower urinary tract symptoms due to benign prostatic hyperplasia 09/25/2020   Chronic postoperative pain 03/19/2018   Elevated transaminase level    Obstructive sleep apnea syndrome 10/01/2017   Benign prostatic hyperplasia with urinary obstruction 02/04/2017   Urinary tract infection 02/04/2017   Cellulitis of left lower extremity 02/03/2017   Gastroesophageal reflux disease without esophagitis 02/03/2017   AF (paroxysmal atrial fibrillation) (HCC) 02/03/2017   Sepsis due to cellulitis (HCC) 02/03/2017   New onset atrial fibrillation (HCC) 08/24/2015   Diuresis excessive 08/22/2015   Bladder outlet obstruction 08/22/2015   Hydronephrosis determined by ultrasound 08/22/2015   AKI (acute kidney injury) (HCC) 08/22/2015   Hypernatremia 08/22/2015   Acute blood loss anemia 08/22/2015   Hematuria 08/22/2015   History of total left knee replacement (TKR) 06/19/2015   Primary osteoarthritis of right knee 06/09/2015   Chronic pain of right knee 05/23/2015   Peripheral edema 08/31/2012   Urinary frequency 08/31/2012   Diverticulosis 03/23/2012   Impaired fasting glucose 02/06/2012   Decreased libido 03/07/2011   Erectile dysfunction 03/07/2011   Essential (primary) hypertension 12/20/2010   Mixed hyperlipidemia 12/20/2010   ALLERGIC RHINITIS, SEASONAL 05/02/2010   SCIATICA 05/02/2010   History of cellulitis 05/02/2010   PCP:  Clinic, [facility] Pharmacy:   [facility] [phone] - [place], [place] - [address] [facility] [address] [place] [address] Phone: [phone] Fax: [phone]  [facility] - [place], [place] - [address] [place] [address] Phone: [phone] Fax: [phone]     Social Drivers of Health (SDOH) Social History: SDOH Screenings   Food  Insecurity: No Food Insecurity (09/23/2023)  Housing: Low Risk  (09/23/2023)  Transportation Needs: No Transportation Needs (09/23/2023)  Utilities: Not At Risk (09/23/2023)  Alcohol Screen: Low Risk  (02/12/2023)  Depression (PHQ2-9): Low Risk  (02/12/2023)  Social Connections: Moderately Isolated (09/23/2023)  Tobacco Use: Medium Risk (09/24/2023)  Health Literacy: Adequate Health Literacy (03/07/2023)   SDOH Interventions:     Readmission Risk Interventions    09/24/2023    2:43 PM 02/27/2023   11:39 AM 02/07/2021    1:55 PM  Readmission Risk Prevention Plan  Transportation Screening Complete Complete Complete  PCP or Specialist Appt within 5-7 Days   Complete  PCP or Specialist Appt within 3-5 Days Not Complete    Home Care Screening   Complete  Medication Review (RN CM)   Referral to Pharmacy  HRI or Home Care Consult  Complete   Social Work Consult for Recovery Care Planning/Counseling Complete Complete   Palliative Care Screening Not Applicable Not Applicable   Medication Review Oceanographer) Complete Complete

## 2023-09-24 NOTE — [place] FL2 (Signed)
 [place]  MEDICAID FL2 LEVEL OF CARE FORM     IDENTIFICATION  Patient Name: Gary Carroll Birthdate: [birth year] Sex: male Admission Date (Current Location): 09/22/2023  [facility] and [place] Number:  [employer] and Address:  [facility],  [address] [place] [address]      Provider Number: [id number]  Attending Physician Name and Address:  Rayfield Cairo, MD  Relative Name and Phone Number:  APPLE,PAT Belvia Boyer)  [phone]    Current Level of Care: Hospital Recommended Level of Care: Skilled Nursing Facility Prior Approval Number:    Date Approved/Denied:   PASRR Number: [id number]  Discharge Plan: SNF    Current Diagnoses: Patient Active Problem List   Diagnosis Date Noted   Sepsis secondary to UTI (HCC) 09/23/2023   COVID-19 08/21/2023   Diarrhea 03/20/2023   Transportation insecurity 03/07/2023   Presence of intraocular lens 03/07/2023   Pain in left shoulder 03/07/2023   Encounter for screening for malignant neoplasm of respiratory organs 03/07/2023   Other secondary cataract, right eye 03/07/2023   Other nonspecific abnormal finding of lung field 03/07/2023   Aortic valve disorder 03/07/2023   Osteopenia 03/07/2023   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 with unspecified severity (HCC) 03/07/2023   Need for assistance with personal care 03/07/2023   Hyperglycemia, unspecified 03/07/2023   Edema, unspecified 03/07/2023   Disease of anus and rectum, unspecified 03/07/2023   Chronic venous insufficiency 03/07/2023   Bilateral ankle joint pain 03/07/2023   Age-related nuclear cataract, bilateral 03/07/2023   CAD (coronary artery disease) 02/25/2023   (HFpEF) heart failure with preserved ejection fraction (HCC) 02/25/2023   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   Acute hypoxemic respiratory failure (HCC) 01/27/2023    Nonrheumatic aortic valve stenosis 01/27/2023   Primary adenocarcinoma of colon (HCC) 01/27/2023   Insomnia 01/27/2023   Acute heart failure with preserved ejection fraction (HFpEF) (HCC) 01/20/2023   SOB (shortness of breath) 12/23/2022   Acute respiratory failure with hypoxia (HCC) 12/21/2022   Elevated troponin 12/21/2022   Rectal pain 12/04/2022   Rectal bleed 12/04/2022   Hemorrhoids 08/29/2022   Chronic constipation 06/24/2022   Occult blood in stools 06/24/2022   CAP (community acquired pneumonia) 06/03/2022   Urethritis 05/01/2022   Candidiasis of penis 05/01/2022   Spasm 04/29/2022   Hardening of the aorta (main artery of the heart) (HCC) 04/29/2022   Complication due to secondary diabetes mellitus (HCC) 04/29/2022   Chronic obstructive pulmonary disease (HCC) 04/29/2022   Obstructive sleep apnea (adult) (pediatric) 11/29/2021   Potassium disorder 11/26/2021   Aortic valve stenosis 10/18/2021   Pressure injury of sacral region, stage 1 10/18/2021   Bilateral lower extremity edema 10/18/2021   Acute urinary tract infection 09/29/2021   Dysuria 09/29/2021   Blister 08/17/2021   Upper respiratory infection 07/05/2021   Cough 07/05/2021   Restrictive lung disease 06/26/2021   Multiple nodules of lung 04/30/2021   Otalgia of left ear 04/04/2021   Acute otitis media 04/04/2021   Tinea cruris 03/19/2021   Hard stool 03/19/2021   Chronic anticoagulation 03/14/2021   Aortic stenosis 03/14/2021   Pressure ulcer of buttock 02/03/2021   Acute respiratory failure with hypoxia and hypercapnia (HCC)    Abnormal electron beam computed tomography 01/01/2021   Abnormal CT of the chest 10/23/2020   History of tobacco use 10/23/2020   Paroxysmal atrial fibrillation (HCC) 09/25/2020   Type  2 diabetes mellitus (HCC) 09/25/2020   Senile osteoporosis 09/25/2020   Retention of urine 09/25/2020   Morbid obesity (HCC) 09/25/2020   Chronic combined systolic and diastolic heart failure  (HCC) 44/07/4740   Atherosclerosis of coronary artery without angina pectoris 09/25/2020   Lower urinary tract symptoms due to benign prostatic hyperplasia 09/25/2020   Chronic postoperative pain 03/19/2018   Elevated transaminase level    Obstructive sleep apnea syndrome 10/01/2017   Benign prostatic hyperplasia with urinary obstruction 02/04/2017   Urinary tract infection 02/04/2017   Cellulitis of left lower extremity 02/03/2017   Gastroesophageal reflux disease without esophagitis 02/03/2017   AF (paroxysmal atrial fibrillation) (HCC) 02/03/2017   Sepsis due to cellulitis (HCC) 02/03/2017   New onset atrial fibrillation (HCC) 08/24/2015   Diuresis excessive 08/22/2015   Bladder outlet obstruction 08/22/2015   Hydronephrosis determined by ultrasound 08/22/2015   AKI (acute kidney injury) (HCC) 08/22/2015   Hypernatremia 08/22/2015   Acute blood loss anemia 08/22/2015   Hematuria 08/22/2015   History of total left knee replacement (TKR) 06/19/2015   Primary osteoarthritis of right knee 06/09/2015   Chronic pain of right knee 05/23/2015   Peripheral edema 08/31/2012   Urinary frequency 08/31/2012   Diverticulosis 03/23/2012   Impaired fasting glucose 02/06/2012   Decreased libido 03/07/2011   Erectile dysfunction 03/07/2011   Essential (primary) hypertension 12/20/2010   Mixed hyperlipidemia 12/20/2010   ALLERGIC RHINITIS, SEASONAL 05/02/2010   SCIATICA 05/02/2010   History of cellulitis 05/02/2010    Orientation RESPIRATION BLADDER Height & Weight     Self, Time, Situation, Place  Normal Continent Weight: 107.1 kg Height:  5\' 11"  (180.3 cm)  BEHAVIORAL SYMPTOMS/MOOD NEUROLOGICAL BOWEL NUTRITION STATUS      Continent Diet (See DC summary)  AMBULATORY STATUS COMMUNICATION OF NEEDS Skin   Extensive Assist Verbally Bruising                       Personal Care Assistance Level of Assistance  Feeding, Bathing, Dressing Bathing Assistance: Maximum assistance Feeding  assistance: Limited assistance Dressing Assistance: Maximum assistance     Functional Limitations Info  Sight, Hearing, Speech Sight Info: Impaired Hearing Info: Adequate Speech Info: Adequate    SPECIAL CARE FACTORS FREQUENCY  PT (By licensed PT)     PT Frequency: 5 times a week              Contractures Contractures Info: Not present    Additional Factors Info  Allergies, Code Status Code Status Info: Full Allergies Info: Diovan, Tagamet           Current Medications (09/24/2023):  This is the current hospital active medication list Current Facility-Administered Medications  Medication Dose Route Frequency Provider Last Rate Last Admin   acetaminophen  (TYLENOL ) tablet 650 mg  650 mg Oral Q6H PRN Shah, Pratik D, DO   650 mg at 09/24/23 5956   Or   acetaminophen  (TYLENOL ) suppository 650 mg  650 mg Rectal Q6H PRN Mason Sole, Pratik D, DO       apixaban  (ELIQUIS ) tablet 5 mg  5 mg Oral BID Mason Sole, Pratik D, DO   5 mg at 09/24/23 0818   cefTRIAXone  (ROCEPHIN ) 2 g in sodium chloride  0.9 % 100 mL IVPB  2 g Intravenous Q24H Mason Sole, Pratik D, DO 200 mL/hr at 09/24/23 0918 2 g at 09/24/23 3875   diltiazem  (CARDIZEM  CD) 24 hr capsule 120 mg  120 mg Oral Daily Mason Sole, Pratik D, DO   120 mg at 09/24/23  3295   dutasteride  (AVODART ) capsule 0.5 mg  0.5 mg Oral Daily Mason Sole, Pratik D, DO   0.5 mg at 09/24/23 1884   insulin  aspart (novoLOG ) injection 0-9 Units  0-9 Units Subcutaneous TID WC Shah, Pratik D, DO       losartan  (COZAAR ) tablet 50 mg  50 mg Oral Daily Mason Sole, Pratik D, DO   50 mg at 09/24/23 0818   melatonin tablet 10 mg  10 mg Oral QHS Shah, Pratik D, DO       metoprolol  tartrate (LOPRESSOR ) tablet 25 mg  25 mg Oral QHS Shah, Pratik D, DO   25 mg at 09/23/23 2100   metoprolol  tartrate (LOPRESSOR ) tablet 50 mg  50 mg Oral Daily Adefeso, Oladapo, DO   50 mg at 09/24/23 0818   ondansetron  (ZOFRAN ) tablet 4 mg  4 mg Oral Q6H PRN Mason Sole, Pratik D, DO       Or   ondansetron  (ZOFRAN ) injection 4  mg  4 mg Intravenous Q6H PRN Shah, Pratik D, DO       Oral care mouth rinse  15 mL Mouth Rinse PRN Adefeso, Oladapo, DO       oxybutynin  (DITROPAN ) tablet 5 mg  5 mg Oral QPM Shah, Pratik D, DO   5 mg at 09/23/23 1641   pantoprazole  (PROTONIX ) EC tablet 40 mg  40 mg Oral Daily PRN Shah, Pratik D, DO         Discharge Medications: Please see discharge summary for a list of discharge medications.  Relevant Imaging Results:  Relevant Lab Results:   Additional Information SS# [id number]  Orelia Binet, RN

## 2023-09-24 NOTE — Hospital Course (Signed)
 78 y.o. male with medical history significant for severe AS status post TAVR, PAF on Eliquis , CAD, rectal adenocarcinoma, hypertension, dyslipidemia, type 2 diabetes, and chronic HFpEF who presented to the ED with weakness and inability to ambulate.  He was recently discharged to SNF on 4/8 with noted significant weakness at that time related to COVID-19 infection.  He was brought to ED via EMS who reported that his living conditions were quite bad and patient has been unable to ambulate well for a long period of time.  The call was made to EMS after he fell and he reported that he felt quite cold and could not get warm.   ED Course: Initial temperature 101.4 Fahrenheit with pulse rate of 101 and blood pressure stable.  Platelet count of 123,000 and other labs within normal limits.  Urine analysis suggesting UTI and patient was given 1 L fluid bolus as well as Rocephin .  Blood and urine cultures have been ordered and pending.

## 2023-09-24 NOTE — Plan of Care (Signed)

## 2023-09-24 NOTE — Plan of Care (Signed)
  Problem: Acute Rehab OT Goals (only OT should resolve) Goal: Pt. Will Perform Grooming Flowsheets (Taken 09/24/2023 1053) Pt Will Perform Grooming:  with contact guard assist  standing Goal: Pt. Will Perform Lower Body Bathing Flowsheets (Taken 09/24/2023 1053) Pt Will Perform Lower Body Bathing:  with min assist  sitting/lateral leans Goal: Pt. Will Perform Lower Body Dressing Flowsheets (Taken 09/24/2023 1053) Pt Will Perform Lower Body Dressing:  with min assist  sitting/lateral leans Goal: Pt. Will Transfer To Toilet Flowsheets (Taken 09/24/2023 1053) Pt Will Transfer to Toilet:  with supervision  stand pivot transfer Goal: Pt. Will Perform Toileting-Clothing Manipulation Flowsheets (Taken 09/24/2023 1053) Pt Will Perform Toileting - Clothing Manipulation and hygiene:  with min assist  sitting/lateral leans Goal: Pt/Caregiver Will Perform Home Exercise Program Flowsheets (Taken 09/24/2023 1053) Pt/caregiver will Perform Home Exercise Program:  Increased strength  Both right and left upper extremity  Independently  Miche Loughridge OT, MOT

## 2023-09-24 NOTE — Progress Notes (Signed)
 Physical Therapy Treatment Patient Details Name: Gary Carroll MRN: [id number] DOB: Jul 05, 1945 Today's Date: 09/24/2023   History of Present Illness Gary Carroll is a 78 y.o. male with medical history significant for severe AS status post TAVR, PAF on Eliquis , CAD, rectal adenocarcinoma, hypertension, dyslipidemia, type 2 diabetes, and chronic HFpEF who presented to the ED with weakness and inability to ambulate.  He was recently discharged to SNF on 4/8 with noted significant weakness at that time related to COVID-19 infection.  He was brought to ED via EMS who reported that his living conditions were quite bad and patient has been unable to ambulate well for a long period of time.  The call was made to EMS after he fell and he reported that he felt quite cold and could not get warm.    PT Comments  Patient demonstrates slow labored movement for rolling and sitting up from side lying position using bed rail, once seated unable to complete sit to stand with bed low, had to raise bed and limited to a few side steps at bedside before having to sit due to c/o fatigue, BLE weakness. Patient tolerated sitting up in chair after therapy. Patient will benefit from continued skilled physical therapy in hospital and recommended venue below to increase strength, balance, endurance for safe ADLs and gait.       If plan is discharge home, recommend the following: A lot of help with walking and/or transfers;A lot of help with bathing/dressing/bathroom;Assistance with cooking/housework;Assist for transportation;Help with stairs or ramp for entrance   Can travel by private vehicle     No  Equipment Recommendations  None recommended by PT    Recommendations for Other Services       Precautions / Restrictions Precautions Precautions: Fall Recall of Precautions/Restrictions: Intact Restrictions Weight Bearing Restrictions Per Provider Order: No     Mobility  Bed Mobility Overal bed mobility:  Needs Assistance Bed Mobility: Supine to Sit Rolling: Max assist, Used rails   Supine to sit: Mod assist, Max assist     General bed mobility comments: increased time, labored movement    Transfers Overall transfer level: Needs assistance   Transfers: Sit to/from Stand, Bed to chair/wheelchair/BSC Sit to Stand: Mod assist   Step pivot transfers: Mod assist       General transfer comment: had to elevate bed for completing sit to stand from bedside due to BLE weakness    Ambulation/Gait Ambulation/Gait assistance: Mod assist Gait Distance (Feet): 5 Feet Assistive device: Rolling walker (2 wheels) Gait Pattern/deviations: Decreased step length - right, Decreased step length - left, Decreased stride length Gait velocity: slow     General Gait Details: limited to a few slow labored unsteady steps at bedside due to c/o BLE weakness and fatigue   Stairs             Wheelchair Mobility     Tilt Bed    Modified Rankin (Stroke Patients Only)       Balance Overall balance assessment: Needs assistance Sitting-balance support: Feet supported, No upper extremity supported Sitting balance-Leahy Scale: Fair Sitting balance - Comments: seated at EOB   Standing balance support: Bilateral upper extremity supported, During functional activity, Reliant on assistive device for balance Standing balance-Leahy Scale: Poor Standing balance comment: using RW                            Communication Communication Communication: No apparent difficulties  Cognition  Arousal: Alert Behavior During Therapy: WFL for tasks assessed/performed   PT - Cognitive impairments: No apparent impairments                         Following commands: Intact      Cueing Cueing Techniques: Verbal cues, Tactile cues  Exercises      General Comments        Pertinent Vitals/Pain Pain Assessment Pain Assessment: 0-10 Pain Score: 5  Pain Location: gluteal region;  pain in L and R flank as well Pain Descriptors / Indicators: Sore, Grimacing Pain Intervention(s): Limited activity within patient's tolerance, Monitored during session, Repositioned    Home Living Family/patient expects to be discharged to:: Private residence Living Arrangements: Alone Available Help at Discharge: Friend(s);Available PRN/intermittently Type of Home: Apartment Home Access: Stairs to enter Entrance Stairs-Rails: Doctor, general practice of Steps: 4   Home Layout: One level Home Equipment: Agricultural consultant (2 wheels);BSC/3in1;Shower seat      Prior Function            PT Goals (current goals can now be found in the care plan section) Acute Rehab PT Goals Patient Stated Goal: return home after rehab PT Goal Formulation: With patient Time For Goal Achievement: 09/30/23 Potential to Achieve Goals: Good Progress towards PT goals: Progressing toward goals    Frequency    Min 3X/week      PT Plan      Co-evaluation PT/OT/SLP Co-Evaluation/Treatment: Yes Reason for Co-Treatment: To address functional/ADL transfers PT goals addressed during session: Mobility/safety with mobility;Balance;Proper use of DME OT goals addressed during session: ADL's and self-care      AM-PAC PT "6 Clicks" Mobility   Outcome Measure  Help needed turning from your back to your side while in a flat bed without using bedrails?: A Lot Help needed moving from lying on your back to sitting on the side of a flat bed without using bedrails?: A Lot Help needed moving to and from a bed to a chair (including a wheelchair)?: A Lot Help needed standing up from a chair using your arms (e.g., wheelchair or bedside chair)?: A Lot Help needed to walk in hospital room?: A Lot Help needed climbing 3-5 steps with a railing? : Total 6 Click Score: 11    End of Session Equipment Utilized During Treatment: Gait belt Activity Tolerance: Patient tolerated treatment well;Patient limited by  fatigue Patient left: in chair;with call bell/phone within reach Nurse Communication: Mobility status PT Visit Diagnosis: Muscle weakness (generalized) (M62.81);Other abnormalities of gait and mobility (R26.89);Unsteadiness on feet (R26.81)     Time: 2130-8657 PT Time Calculation (min) (ACUTE ONLY): 30 min  Charges:    $Therapeutic Activity: 23-37 mins PT General Charges $$ ACUTE PT VISIT: 1 Visit                     12:00 PM, 09/24/23 Walton Guppy, MPT Physical Therapist with [facility] [phone] office [phone] mobile phone

## 2023-09-24 NOTE — Consult Note (Signed)
 Consultation Note Date: 09/24/2023   Patient Name: Gary Carroll  DOB: June 20, 1945  MRN: [id number]  Age / Sex: 78 y.o., male  PCP: [facility] Referring Physician: Rayfield Cairo, MD  Reason for Consultation: Establishing goals of care  HPI/Patient Profile: 78 y.o. male  with past medical history of TAVR October 2024 for severe aortic stenosis, A-fib on blood thinners, rectal cancer with chemo/radiation treatment [facility], HTN/HLD, CAD, HFpEF, total knee replacement admitted on 09/22/2023 with sepsis secondary to UTI prior to arrival.   Clinical Assessment and Goals of Care: I have reviewed medical records including EPIC notes, labs and imaging, received report from RN, assessed the patient.  Gary Carroll is sitting up in the bed with his lunch tray in front of him.  He greets me, making and mostly keeping eye contact.  He is alert and oriented x 3, able to make his needs known.  There is no family at bedside at this time.  We meet at the bedside to discuss diagnosis prognosis, GOC, EOL wishes, disposition and options.  I introduced Palliative Medicine as specialized medical care for people living with serious illness. It focuses on providing relief from the symptoms and stress of a serious illness. The goal is to improve quality of life for both the patient and the family.  We discussed a brief life review of the patient.  From 08/21/2023 palliative note: "Gary Carroll states that he was married and divorced twice before his last wife died. He has no children. He does not have living siblings. He tells me that he has a few cousins who are in their 53s and over 28 years old. Gary Carroll states that he has been using [employer] recently to get food. Prior to this acute illness he had been independent with ADLs/IADLs, even driving."  We then  focused on their current illness.  We talked about Gary Carroll's urinary tract infection and the treatment plan.  Overall, he seems to be improving quickly.  We talked about his poor functional status.  Although he just came from [facility] with 20 days of rehab, he feels that he is not strong enough to walk and care for himself at home.  He had been active with [facility] for home health/PT services but states he feels he may need rehab again.  We talked about co-pay days and I share that our transition of care team will work closely with him for specifics.   Gary Carroll states that he would not want to return to [facility], but would go to another facility, [place] facility if possible.  We talked about his cancer treatment.  He finished chemo/radiation in December for rectal cancer.  He tells me that he has seen the [facility] several times, but is unsure of where he stands with his cancer.  The natural disease trajectory and expectations at EOL were discussed.  Advanced directives, concepts specific to code status, artifical feeding and hydration,  and rehospitalization were considered and discussed.  We talked about the concept of "treat the treatable, but allow a natural passing".  At this point, Gary Carroll shares that he is unsure if he would want attempted resuscitation.  I encouraged him to consider what he does and does not want and share this with his healthcare power of attorney.  Discussed the importance of continued conversation with family and the medical providers regarding overall plan of care and treatment options, ensuring decisions are within the context of the patient's values and GOCs.  Questions and concerns were addressed.  The family was encouraged to call with questions or concerns.  PMT will continue to support holistically.  Conference with attending, bedside nursing staff, transition of care team, physical therapy related to patient condition, needs, goals of care,  disposition.     HCPOA  HCPOA - Gary Carroll tells me that his longtime friend, Shanon Darting, is his legal HCPOA.  He shares that she has paperwork, and knows his wishes.     SUMMARY OF RECOMMENDATIONS   Continue to treat the treatable Considering CODE STATUS Agreeable to short-term rehab, but not at [facility] Time for outcomes    Code Status/Advance Care Planning: Full code - We talked about the concept of "treat the treatable, but allow a natural passing".  At this point, Gary Carroll shares that he is unsure if he would want attempted resuscitation.  I encouraged him to consider what he does and does not want and share this with his healthcare power of attorney.  Symptom Management:  Per hospitalist, no additional needs at this time.  Palliative Prophylaxis:  Frequent Pain Assessment, Oral Care, and Turn Reposition  Additional Recommendations (Limitations, Scope, Preferences): Full Scope Treatment  Psycho-social/Spiritual:  Desire for further Chaplaincy support:no Additional Recommendations: Caregiving  Support/Resources  Prognosis:  Unable to determine, based on outcomes.  Less than 1 year would not be surprising based on chronic illness burden, decreasing functional status.  Discharge Planning: To be determined, open to short-term rehab      Primary Diagnoses: Present on Admission:  Sepsis secondary to UTI (HCC)  (HFpEF) heart failure with preserved ejection fraction (HCC)  Benign prostatic hyperplasia with urinary obstruction  CAD (coronary artery disease)  Chronic obstructive pulmonary disease (HCC)  Essential (primary) hypertension  Mixed hyperlipidemia  Obstructive sleep apnea syndrome   I have reviewed the medical record, interviewed the patient and family, and examined the patient. The following aspects are pertinent.  Past Medical History:  Diagnosis Date   A-fib [facility])    Allergic rhinitis    Arthritis    Asthma    seasonal per pt   Bilateral  knee pain    OA   CAD (coronary artery disease)    Diabetes mellitus without complication (HCC)    Diverticulosis 02/2012   seen on colonoscopy   Erectile dysfunction    GERD (gastroesophageal reflux disease)    Hiatal hernia    HLD (hyperlipidemia)    HTN (hypertension)    Hydronephrosis determined by ultrasound 08/22/2015   Overactive bladder    Prediabetes    Pulmonary lesion    Ringing of ears    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 Arlester Ladd and Dr. Sherene Dilling   Severe aortic stenosis    Sleep apnea    Urinary frequency    Urinary urgency    Wears glasses    Social History   Socioeconomic History  Marital status: Divorced    Spouse name: Not on file   Number of children: 0   Years of education: Not on file   Highest education level: Not on file  Occupational History   Occupation: retired Lobbyist)    Employer: RETIRED  Tobacco Use   Smoking status: Former    Current packs/day: 0.00    Types: Cigarettes    Quit date: 01/18/2010    Years since quitting: 13.6   Smokeless tobacco: Never   Tobacco comments:    Smoked for 25 years- quit September 2011.  Passive exposure from wife  Vaping Use   Vaping status: Never Used  Substance and Sexual Activity   Alcohol use: Not Currently    Alcohol/week: 1.0 - 2.0 standard drink of alcohol    Types: 1 - 2 Glasses of wine per week   Drug use: No   Sexual activity: Not on file  Other Topics Concern   Not on file  Social History Narrative   Separated from wife (2014) and moved to [place]. Retired- worked in Production designer, theatre/television/film.    Social Drivers of Corporate investment banker Strain: Not on file  Food Insecurity: No Food Insecurity (09/23/2023)   Hunger Vital Sign    Worried About Running Out of Food in the Last Year: Never true    Ran Out of Food in the Last Year: Never true  Transportation Needs: No Transportation Needs (09/23/2023)   PRAPARE - Therapist, art (Medical): No    Lack of Transportation (Non-Medical): No  Physical Activity: Not on file  Stress: Not on file  Social Connections: Moderately Isolated (09/23/2023)   Social Connection and Isolation Panel [NHANES]    Frequency of Communication with Friends and Family: More than three times a week    Frequency of Social Gatherings with Friends and Family: Never    Attends Religious Services: Never    Database administrator or Organizations: Yes    Attends Banker Meetings: Never    Marital Status: Divorced   Family History  Problem Relation Age of Onset   Uterine cancer Mother    Cancer Mother        uterine   Coronary artery disease Father    Coronary artery disease Paternal Grandfather    Heart disease Paternal Grandfather    Colon cancer Neg Hx    Esophageal cancer Neg Hx    Stomach cancer Neg Hx    Rectal cancer Neg Hx    Liver disease Neg Hx    Scheduled Meds:  apixaban   5 mg Oral BID   diltiazem   120 mg Oral Daily   dutasteride   0.5 mg Oral Daily   insulin  aspart  0-9 Units Subcutaneous TID WC   losartan   50 mg Oral Daily   melatonin  10 mg Oral QHS   metoprolol  tartrate  25 mg Oral QHS   metoprolol  tartrate  50 mg Oral Daily   oxybutynin   5 mg Oral QPM   Continuous Infusions:  cefTRIAXone  (ROCEPHIN )  IV     PRN Meds:.acetaminophen  **OR** acetaminophen , ondansetron  **OR** ondansetron  (ZOFRAN ) IV, mouth rinse, pantoprazole  Medications Prior to Admission:  Prior to Admission medications   Medication Sig Start Date End Date Taking? Authorizing Provider  acetaminophen  (TYLENOL ) 650 MG CR tablet Take 1,300 mg by mouth every 8 (eight) hours as needed for pain.   Yes [provider]  apixaban  (ELIQUIS ) 5 MG TABS tablet Take 1 tablet (5 mg  total) by mouth 2 (two) times daily. 08/24/15  Yes Zilphia Hilt, Charyl Coppersmith, MD  diltiazem  (CARDIZEM  CD) 120 MG 24 hr capsule Take 1 capsule (120 mg total) by mouth daily. 01/18/23  Yes Alissa April, MD  dutasteride  (AVODART ) 0.5 MG capsule Take 0.5 mg by mouth daily.   Yes [provider]  losartan  (COZAAR ) 50 MG tablet Take 0.5 tablets (25 mg total) by mouth daily. Patient taking differently: Take 50 mg by mouth daily. 12/23/22  Yes Justina Oman, MD  Magnesium  500 MG CAPS Take 500 mg by mouth daily.   Yes [provider]  Melatonin 10 MG CAPS Take 10 mg by mouth at bedtime.   Yes [provider]  metFORMIN  (GLUCOPHAGE ) 500 MG tablet Take 500 mg by mouth 2 (two) times daily with a meal.   Yes [provider]  metoprolol  tartrate (LOPRESSOR ) 25 MG tablet Take 25-50 mg by mouth 2 (two) times daily. 50 mg am 25 mg pm 05/06/23  Yes [provider]  metoprolol  tartrate (LOPRESSOR ) 50 MG tablet Take 1 tablet (50 mg total) by mouth 2 (two) times daily. 08/25/23  Yes Shah, Pratik D, DO  Multiple Vitamins-Minerals (MULTIVITAL) tablet Take 1 tablet by mouth daily.   Yes [provider]  Omega-3 Fatty Acids (FISH OIL) 1000 MG CAPS Take 1,000 mg by mouth in the morning and at bedtime.   Yes [provider]  oxybutynin  (DITROPAN ) 5 MG tablet Take 5 mg by mouth every evening. 06/01/19  Yes [provider]  potassium chloride  (KLOR-CON ) 10 MEQ tablet Take 1 tablet (10 mEq total) by mouth daily. 02/06/23  Yes Ardia Kraft, PA-C  rosuvastatin  (CRESTOR ) 20 MG tablet Take 20 mg by mouth daily.   Yes [provider]  vitamin C  (ASCORBIC ACID ) 500 MG tablet Take 500 mg by mouth daily.   Yes [provider]   Allergies  Allergen Reactions   Diovan [Valsartan] Cough   Tagamet [Cimetidine] Other (See Comments)    Irritability    Review of Systems  Unable to perform ROS: Other    Physical Exam Vitals and nursing note reviewed.  Constitutional:      General: He is not in acute distress.    Appearance: He is obese. He is ill-appearing.  Cardiovascular:     Rate and Rhythm: Normal rate.  Pulmonary:      Effort: Pulmonary effort is normal. No respiratory distress.  Skin:    General: Skin is warm and dry.  Neurological:     Mental Status: He is alert and oriented to person, place, and time.  Psychiatric:        Mood and Affect: Mood normal.        Behavior: Behavior normal.     Vital Signs: BP (!) 140/82 (BP Location: Left Arm)   Pulse (!) 103   Temp 98.1 F (36.7 C) (Oral)   Resp 20   Ht 5\' 11"  (1.803 m)   Wt 107.1 kg   SpO2 96%   BMI 32.93 kg/m  Pain Scale: 0-10   Pain Score: 3    SpO2: SpO2: 96 % O2 Device:SpO2: 96 % O2 Flow Rate: .   IO: Intake/output summary:  Intake/Output Summary (Last 24 hours) at 09/24/2023 0846 Last data filed at 09/24/2023 0510 Gross per 24 hour  Intake 480 ml  Output 750 ml  Net -270 ml    LBM: Last BM Date : 09/23/23 Baseline Weight: Weight: 112 kg Most recent weight: Weight:  107.1 kg     Palliative Assessment/Data:     Time In: 1030    Time Out: 1145 Time Total: 75 minutes  Greater than 50%  of this time was spent counseling and coordinating care related to the above assessment and plan.  Signed by: Annabelle Barrack, NP   Please contact Palliative Medicine Team phone at [phone] for questions and concerns.  For individual provider: See Tilford Foley

## 2023-09-24 NOTE — Progress Notes (Signed)
 PROGRESS NOTE   Gary Carroll  ZOX:[id number] DOB: 1945-11-26 DOA: 09/22/2023 PCP: [facility]   Chief Complaint  Patient presents with   Failure To Thrive   Level of care: Med-Surg  Brief Admission History:  78 y.o. male with medical history significant for severe AS status post TAVR, PAF on Eliquis , CAD, rectal adenocarcinoma, hypertension, dyslipidemia, type 2 diabetes, and chronic HFpEF who presented to the ED with weakness and inability to ambulate.  He was recently discharged to SNF on 4/8 with noted significant weakness at that time related to COVID-19 infection.  He was brought to ED via EMS who reported that his living conditions were quite bad and patient has been unable to ambulate well for a long period of time.  The call was made to EMS after he fell and he reported that he felt quite cold and could not get warm.   ED Course: Initial temperature 101.4 Fahrenheit with pulse rate of 101 and blood pressure stable.  Platelet count of 123,000 and other labs within normal limits.  Urine analysis suggesting UTI and patient was given 1 L fluid bolus as well as Rocephin .  Blood and urine cultures have been ordered and pending.   Assessment and Plan:  Sepsis, POA, secondary to UTI - Monitor ongoing cultures with no growth noted on blood cultures thus far - Continue Rocephin  empirically - With generalized weakness associated, will reassess with PT/OT and place on fall precautions, SNF has been recommended by PT, TOC consulted to assist with placement    PAF/flutter -Currently stable on telemetry -Continue home diltiazem  and metoprolol  -apixaban  for anticoagulation  Dysphagia - GI team consulted and planning for EGD today with dilation if needed    Mild transaminitis -Likely related to sepsis physiology, continue to trend and monitor -Holding statin for now   OSA -On CPAP   Hypertension -Continue home medications   Dyslipidemia - Hold statin given mild  transaminitis   Chronic HFpEF -Currently appears euvolemic and given IV fluid bolus in ED, avoid further IV fluid for now   Type 2 diabetes -Currently without hyperglycemia -Continue metformin  -Carb diet -SSI  CBG (last 3)  Recent Labs    09/23/23 2017 09/24/23 0718 09/24/23 1144  GLUCAP 109* 100* 105*   Rectal adenocarcinoma -Follows with oncologist at [facility] and currently not on any further chemotherapy or radiation -Apparently not a candidate for surgery -Overall prognosis appears poor -Appreciate palliative consultation for goals of care discussion especially in light of readmission and what appears to be a failure to thrive picture   History of severe AS status post TAVR -Successful TAVR with 26 mm Edwards SAPIEN 3 THV on 02/25/2023   Obesity, class I -BMI 34.44   DVT prophylaxis: apixaban  resumed  Code Status: Full  Family Communication: patient at bedside Disposition: TBD pending PT eval and TOC consultation    Consultants:   Procedures:   Antimicrobials:    Subjective: Pt reports he is very weak and it has been getting generally worse in last several weeks.   Objective: Vitals:   09/23/23 1400 09/23/23 2016 09/24/23 0226 09/24/23 0426  BP: 134/70 130/83 (!) 142/83 (!) 140/82  Pulse: 98 (!) 107 (!) 102 (!) 103  Resp: 17 20 20 20   Temp:  98 F (36.7 C) 98.7 F (37.1 C) 98.1 F (36.7 C)  TempSrc:  Oral  Oral  SpO2: 100% 96% 93% 96%  Weight:      Height:        Intake/Output  Summary (Last 24 hours) at 09/24/2023 1239 Last data filed at 09/24/2023 0510 Gross per 24 hour  Intake 480 ml  Output 750 ml  Net -270 ml   Filed Weights   09/22/23 2326 09/23/23 0807  Weight: 112 kg 107.1 kg   Examination:  General exam: Appears calm and frail, weak, sitting up in chair, NAD.  Respiratory system: Clear to auscultation. Respiratory effort normal. Cardiovascular system: normal S1 & S2 heard. No JVD, murmurs, rubs, gallops or clicks. No pedal  edema. Gastrointestinal system: Abdomen is nondistended, soft and nontender. No organomegaly or masses felt. Normal bowel sounds heard. Central nervous system: Alert and oriented. No focal neurological deficits. Extremities: Symmetric 5 x 5 power. Skin: No rashes, lesions or ulcers. Psychiatry: Judgement and insight appear normal. Mood & affect appropriate.   Data Reviewed: I have personally reviewed following labs and imaging studies  CBC: Recent Labs  Lab 09/23/23 0042 09/24/23 0502  WBC 5.4 5.0  NEUTROABS 4.4  --   HGB 13.0 12.2*  HCT 39.8 37.9*  MCV 91.9 89.6  PLT 123* 124*    Basic Metabolic Panel: Recent Labs  Lab 09/23/23 0042 09/24/23 0502  NA 137 132*  K 4.3 3.9  CL 105 103  CO2 23 22  GLUCOSE 120* 96  BUN 13 10  CREATININE 0.80 0.78  CALCIUM  8.5* 8.4*  MG  --  1.8    CBG: Recent Labs  Lab 09/23/23 1146 09/23/23 1624 09/23/23 2017 09/24/23 0718 09/24/23 1144  GLUCAP 95 82 109* 100* 105*    Recent Results (from the past 240 hours)  Blood culture (routine x 2)     Status: None (Preliminary result)   Collection Time: 09/23/23 12:42 AM   Specimen: BLOOD  Result Value Ref Range Status   Specimen Description BLOOD BLOOD RIGHT ARM  Final   Special Requests   Final    BOTTLES DRAWN AEROBIC AND ANAEROBIC Blood Culture adequate volume   Culture  Setup Time   Final    IN BOTH AEROBIC AND ANAEROBIC BOTTLES GRAM NEGATIVE RODS Gram Stain Report Called to,Read Back By and Verified With: [facility] AT 1306 09/23/23 BY A WILSON Performed at [facility], [address], [place], [place] [address]    Culture GRAM NEGATIVE RODS  Final   Report Status PENDING  Incomplete  Resp panel by RT-PCR (RSV, Flu A&B, Covid) Anterior Nasal Swab     Status: None   Collection Time: 09/23/23 12:51 AM   Specimen: Anterior Nasal Swab  Result Value Ref Range Status   SARS Coronavirus 2 by RT PCR NEGATIVE NEGATIVE Final    Comment: (NOTE) SARS-CoV-2 target nucleic acids are NOT  DETECTED.  The SARS-CoV-2 RNA is generally detectable in upper respiratory specimens during the acute phase of infection. The lowest concentration of SARS-CoV-2 viral copies this assay can detect is 138 copies/mL. A negative result does not preclude SARS-Cov-2 infection and should not be used as the sole basis for treatment or other patient management decisions. A negative result may occur with  improper specimen collection/handling, submission of specimen other than nasopharyngeal swab, presence of viral mutation(s) within the areas targeted by this assay, and inadequate number of viral copies(<138 copies/mL). A negative result must be combined with clinical observations, patient history, and epidemiological information. The expected result is Negative.  Fact Sheet for Patients:  BloggerCourse.com  Fact Sheet for Healthcare Providers:  SeriousBroker.it  This test is no t yet approved or cleared by the [place]  FDA and  has been authorized for detection and/or diagnosis of SARS-CoV-2 by FDA under an Emergency Use Authorization (EUA). This EUA will remain  in effect (meaning this test can be used) for the duration of the COVID-19 declaration under Section 564(b)(1) of the Act, 21 U.S.C.section 360bbb-3(b)(1), unless the authorization is terminated  or revoked sooner.       Influenza A by PCR NEGATIVE NEGATIVE Final   Influenza B by PCR NEGATIVE NEGATIVE Final    Comment: (NOTE) The Xpert Xpress SARS-CoV-2/FLU/RSV plus assay is intended as an aid in the diagnosis of influenza from Nasopharyngeal swab specimens and should not be used as a sole basis for treatment. Nasal washings and aspirates are unacceptable for Xpert Xpress SARS-CoV-2/FLU/RSV testing.  Fact Sheet for Patients: BloggerCourse.com  Fact Sheet for Healthcare Providers: SeriousBroker.it  This test is not yet  approved or cleared by the [place]  FDA and has been authorized for detection and/or diagnosis of SARS-CoV-2 by FDA under an Emergency Use Authorization (EUA). This EUA will remain in effect (meaning this test can be used) for the duration of the COVID-19 declaration under Section 564(b)(1) of the Act, 21 U.S.C. section 360bbb-3(b)(1), unless the authorization is terminated or revoked.     Resp Syncytial Virus by PCR NEGATIVE NEGATIVE Final    Comment: (NOTE) Fact Sheet for Patients: BloggerCourse.com  Fact Sheet for Healthcare Providers: SeriousBroker.it  This test is not yet approved or cleared by the [place]  FDA and has been authorized for detection and/or diagnosis of SARS-CoV-2 by FDA under an Emergency Use Authorization (EUA). This EUA will remain in effect (meaning this test can be used) for the duration of the COVID-19 declaration under Section 564(b)(1) of the Act, 21 U.S.C. section 360bbb-3(b)(1), unless the authorization is terminated or revoked.  Performed at [facility], [address] [place], [place] [address]   Blood culture (routine x 2)     Status: Abnormal (Preliminary result)   Collection Time: 09/23/23  1:30 AM   Specimen: BLOOD  Result Value Ref Range Status   Specimen Description   Final    BLOOD RIGHT ANTECUBITAL Performed at [facility], [address], [place], [place] [address]    Special Requests   Final    BOTTLES DRAWN AEROBIC AND ANAEROBIC Blood Culture adequate volume Performed at [facility], [address], [place], [place] [address]    Culture  Setup Time   Final    IN BOTH AEROBIC AND ANAEROBIC BOTTLES GRAM NEGATIVE RODS Gram Stain Report Called to,Read Back By and Verified With: A JACKSON AT 1352 09/23/23 BY A WILSON CRITICAL RESULT CALLED TO, READ BACK BY AND VERIFIED WITH: RN JADA MUSE ON 09/23/23 @ 1935 BY DRT    Culture (A)  Final    ESCHERICHIA COLI SUSCEPTIBILITIES TO  FOLLOW Performed at [facility], [address] [place], [place] [address]    Report Status PENDING  Incomplete  Blood Culture ID Panel (Reflexed)     Status: Abnormal   Collection Time: 09/23/23  1:30 AM  Result Value Ref Range Status   Enterococcus faecalis NOT DETECTED NOT DETECTED Final   Enterococcus Faecium NOT DETECTED NOT DETECTED Final   Listeria monocytogenes NOT DETECTED NOT DETECTED Final   Staphylococcus species NOT DETECTED NOT DETECTED Final   Staphylococcus aureus (BCID) NOT DETECTED NOT DETECTED Final   Staphylococcus epidermidis NOT DETECTED NOT DETECTED Final   Staphylococcus lugdunensis NOT DETECTED NOT DETECTED Final   Streptococcus species NOT DETECTED NOT DETECTED Final  Streptococcus agalactiae NOT DETECTED NOT DETECTED Final   Streptococcus pneumoniae NOT DETECTED NOT DETECTED Final   Streptococcus pyogenes NOT DETECTED NOT DETECTED Final   A.calcoaceticus-baumannii NOT DETECTED NOT DETECTED Final   Bacteroides fragilis NOT DETECTED NOT DETECTED Final   Enterobacterales DETECTED (A) NOT DETECTED Final    Comment: Enterobacterales represent a large order of gram negative bacteria, not a single organism. CRITICAL RESULT CALLED TO, READ BACK BY AND VERIFIED WITH: RN JADA MUSE ON 09/23/23 @ 1935 BY DRT    Enterobacter cloacae complex NOT DETECTED NOT DETECTED Final   Escherichia coli DETECTED (A) NOT DETECTED Final    Comment: CRITICAL RESULT CALLED TO, READ BACK BY AND VERIFIED WITH: RN JADA MUSE ON 09/23/23 @ 1935 BY DRT    Klebsiella aerogenes NOT DETECTED NOT DETECTED Final   Klebsiella oxytoca NOT DETECTED NOT DETECTED Final   Klebsiella pneumoniae NOT DETECTED NOT DETECTED Final   Proteus species NOT DETECTED NOT DETECTED Final   Salmonella species NOT DETECTED NOT DETECTED Final   Serratia marcescens NOT DETECTED NOT DETECTED Final   Haemophilus influenzae NOT DETECTED NOT DETECTED Final   Neisseria meningitidis NOT DETECTED NOT DETECTED Final    Pseudomonas aeruginosa NOT DETECTED NOT DETECTED Final   Stenotrophomonas maltophilia NOT DETECTED NOT DETECTED Final   Candida albicans NOT DETECTED NOT DETECTED Final   Candida auris NOT DETECTED NOT DETECTED Final   Candida glabrata NOT DETECTED NOT DETECTED Final   Candida krusei NOT DETECTED NOT DETECTED Final   Candida parapsilosis NOT DETECTED NOT DETECTED Final   Candida tropicalis NOT DETECTED NOT DETECTED Final   Cryptococcus neoformans/gattii NOT DETECTED NOT DETECTED Final   CTX-M ESBL NOT DETECTED NOT DETECTED Final   Carbapenem resistance IMP NOT DETECTED NOT DETECTED Final   Carbapenem resistance KPC NOT DETECTED NOT DETECTED Final   Carbapenem resistance NDM NOT DETECTED NOT DETECTED Final   Carbapenem resist OXA 48 LIKE NOT DETECTED NOT DETECTED Final   Carbapenem resistance VIM NOT DETECTED NOT DETECTED Final    Comment: Performed at [facility], [address], [place], [place] [address]  Urine Culture     Status: Abnormal (Preliminary result)   Collection Time: 09/23/23  4:14 AM   Specimen: Urine, Random  Result Value Ref Range Status   Specimen Description   Final    URINE, RANDOM Performed at [facility], [address], [place], [place] [address]    Special Requests   Final    NONE Reflexed from [phone] Performed at [facility], [address], [place], [place] [address]    Culture (A)  Final    >=100,000 COLONIES/mL ESCHERICHIA COLI 80,000 COLONIES/mL ENTEROCOCCUS FAECALIS SUSCEPTIBILITIES TO FOLLOW Performed at [facility], [address], [place], [place] [address]    Report Status PENDING  Incomplete     Radiology Studies: DG Pelvis Portable Result Date: 09/23/2023 CLINICAL DATA:  Fall EXAM: PORTABLE PELVIS 1-2 VIEWS COMPARISON:  None Available. FINDINGS: There is no evidence of pelvic fracture or diastasis. No pelvic bone lesions are seen. IMPRESSION: Negative. Electronically Signed   By: Janeece Mechanic M.D.   On: 09/23/2023 00:10   DG  Chest Port 1 View Result Date: 09/23/2023 CLINICAL DATA:  Fall EXAM: PORTABLE CHEST 1 VIEW COMPARISON:  08/21/2023 FINDINGS: Prior aortic valve repair. Heart upper limits normal in size. Aortic atherosclerosis. No confluent opacities, effusions or pneumothorax. No acute bony abnormality. IMPRESSION: No active disease. Electronically Signed   By: Janeece Mechanic M.D.  On: 09/23/2023 00:10    Scheduled Meds:  apixaban   5 mg Oral BID   diltiazem   120 mg Oral Daily   dutasteride   0.5 mg Oral Daily   insulin  aspart  0-9 Units Subcutaneous TID WC   losartan   50 mg Oral Daily   melatonin  10 mg Oral QHS   metoprolol  tartrate  25 mg Oral QHS   metoprolol  tartrate  50 mg Oral Daily   oxybutynin   5 mg Oral QPM   Continuous Infusions:  cefTRIAXone  (ROCEPHIN )  IV 2 g (09/24/23 0918)    LOS: 1 day   Time spent: 55 mins  Caileigh Canche Lincoln Renshaw, MD How to contact the [facility] Attending or Consulting provider 7A - 7P or covering provider during after hours 7P -7A, for this patient?  Check the care team in [facility] and look for a) attending/consulting TRH provider listed and b) the TRH team listed Log into [email or web address] to find provider on call.  Locate the TRH provider you are looking for under [facility] and page to a number that you can be directly reached. If you still have difficulty reaching the provider, please page the [facility] (Director on Call) for the Hospitalists listed on amion for assistance.  09/24/2023, 12:39 PM

## 2023-09-24 NOTE — Evaluation (Signed)
 Occupational Therapy Evaluation Patient Details Name: Gary Carroll MRN: [id number] DOB: [birth year] Today's Date: 09/24/2023   History of Present Illness   Gary Carroll is a 78 y.o. male with medical history significant for severe AS status post TAVR, PAF on Eliquis , CAD, rectal adenocarcinoma, hypertension, dyslipidemia, type 2 diabetes, and chronic HFpEF who presented to the ED with weakness and inability to ambulate.  He was recently discharged to SNF on 4/8 with noted significant weakness at that time related to COVID-19 infection.  He was brought to ED via EMS who reported that his living conditions were quite bad and patient has been unable to ambulate well for a long period of time.  The call was made to EMS after he fell and he reported that he felt quite cold and could not get warm.     Clinical Impressions Pt agreeable to OT and PT co-evaluation. Pt reports being home from the SNF for a few days with difficulty taking care of himself. Today pt required mod to max A for bed mobility. Max A for lower body ADL's per clinical judgement. Mod A for step pivot to chair from elevated EOB with RW. B UE generally weak. Pt left in the chair with call bell within reach. Pt noted to have wound on L buttock area that the nursing staff is aware off. Wound noted during peri-care. Pt will benefit from continued OT in the hospital and recommended venue below to increase strength, balance, and endurance for safe ADL's.        If plan is discharge home, recommend the following:   A lot of help with walking and/or transfers;A lot of help with bathing/dressing/bathroom;Assistance with cooking/housework;Assist for transportation;Help with stairs or ramp for entrance     Functional Status Assessment   Patient has had a recent decline in their functional status and demonstrates the ability to make significant improvements in function in a reasonable and predictable amount of time.      Equipment Recommendations   None recommended by OT             Precautions/Restrictions   Precautions Precautions: Fall Recall of Precautions/Restrictions: Intact Restrictions Weight Bearing Restrictions Per Provider Order: No     Mobility Bed Mobility Overal bed mobility: Needs Assistance Bed Mobility: Supine to Sit     Supine to sit: Mod assist, Max assist     General bed mobility comments: labored movement; physical assist needed.    Transfers Overall transfer level: Needs assistance Equipment used: Rolling walker (2 wheels) Transfers: Sit to/from Stand, Bed to chair/wheelchair/BSC Sit to Stand: Mod assist     Step pivot transfers: Mod assist     General transfer comment: From elevated EOB using RW for EOB to chair transfer.      Balance Overall balance assessment: Needs assistance Sitting-balance support: No upper extremity supported, Feet supported Sitting balance-Leahy Scale: Fair Sitting balance - Comments: seated at EOB   Standing balance support: Bilateral upper extremity supported, During functional activity, Reliant on assistive device for balance Standing balance-Leahy Scale: Poor Standing balance comment: using RW                           ADL either performed or assessed with clinical judgement   ADL Overall ADL's : Needs assistance/impaired     Grooming: Set up;Sitting   Upper Body Bathing: Set up;Sitting   Lower Body Bathing: Maximal assistance;Sitting/lateral leans   Upper Body Dressing : Set  up;Sitting   Lower Body Dressing: Maximal assistance;Sitting/lateral leans   Toilet Transfer: Moderate assistance;Stand-pivot;Rolling walker (2 wheels) Toilet Transfer Details (indicate cue type and reason): Simulated via EOB to chair transfer Toileting- Clothing Manipulation and Hygiene: Maximal assistance;Sit to/from stand;Total assistance Toileting - Clothing Manipulation Details (indicate cue type and reason): Pt stood  with physical therapy while this therapist assited with peri-care.             Vision Baseline Vision/History: 1 Wears glasses Ability to See in Adequate Light: 1 Impaired Patient Visual Report: No change from baseline Vision Assessment?: No apparent visual deficits (outside baseline issues)     Perception Perception: Not tested       Praxis Praxis: Not tested       Pertinent Vitals/Pain Pain Assessment Pain Assessment: 0-10 Pain Score: 5  Pain Location: gluteal region; pain in L and R flank as well Pain Descriptors / Indicators: Sore, Grimacing Pain Intervention(s): Limited activity within patient's tolerance, Monitored during session, Repositioned     Extremity/Trunk Assessment Upper Extremity Assessment Upper Extremity Assessment: Generalized weakness   Lower Extremity Assessment Lower Extremity Assessment: Defer to PT evaluation   Cervical / Trunk Assessment Cervical / Trunk Assessment: Kyphotic   Communication Communication Communication: No apparent difficulties   Cognition Arousal: Alert Behavior During Therapy: WFL for tasks assessed/performed Cognition: No apparent impairments                               Following commands: Intact       Cueing  General Comments   Cueing Techniques: Verbal cues;Tactile cues                 Home Living Family/patient expects to be discharged to:: Private residence Living Arrangements: Alone Available Help at Discharge: Friend(s);Available PRN/intermittently Type of Home: Apartment Home Access: Stairs to enter Entrance Stairs-Number of Steps: 4 Entrance Stairs-Rails: Right;Left Home Layout: One level     Bathroom Shower/Tub: Chief Strategy Officer: Standard Bathroom Accessibility: Yes   Home Equipment: Agricultural consultant (2 wheels);BSC/3in1;Shower seat          Prior Functioning/Environment Prior Level of Function : Independent/Modified Independent              Mobility Comments: Pt reports previous household ambulator but since returning home after SNF stay, has had a steady decline. Has not been able to tramsfer or ambulate (per PT note) ADLs Comments: Reports as previously (I) but unable to perform ADLs at home since SNF discharge (per PT note and pt report.)    OT Problem List: Decreased strength;Decreased activity tolerance;Impaired balance (sitting and/or standing)   OT Treatment/Interventions: Self-care/ADL training;Therapeutic exercise;Energy conservation;DME and/or AE instruction;Therapeutic activities;Patient/family education;Balance training      OT Goals(Current goals can be found in the care plan section)   Acute Rehab OT Goals Patient Stated Goal: improve function OT Goal Formulation: With patient Time For Goal Achievement: 10/08/23 Potential to Achieve Goals: Good   OT Frequency:  Min 2X/week    Co-evaluation PT/OT/SLP Co-Evaluation/Treatment: Yes Reason for Co-Treatment: To address functional/ADL transfers   OT goals addressed during session: ADL's and self-care                       End of Session Equipment Utilized During Treatment: Rolling walker (2 wheels);Gait belt  Activity Tolerance: Patient tolerated treatment well Patient left: in chair;with call bell/phone within reach  OT Visit Diagnosis: Unsteadiness  on feet (R26.81);Other abnormalities of gait and mobility (R26.89);Muscle weakness (generalized) (M62.81)                Time: 1610-9604 OT Time Calculation (min): 23 min Charges:  OT General Charges $OT Visit: 1 Visit OT Evaluation $OT Eval Low Complexity: 1 Low  Gary Carroll OT, MOT  Gary Carroll 09/24/2023, 10:48 AM

## 2023-09-25 DIAGNOSIS — J449 Chronic obstructive pulmonary disease, unspecified: Secondary | ICD-10-CM | POA: Diagnosis not present

## 2023-09-25 DIAGNOSIS — I5032 Chronic diastolic (congestive) heart failure: Secondary | ICD-10-CM | POA: Diagnosis not present

## 2023-09-25 DIAGNOSIS — E782 Mixed hyperlipidemia: Secondary | ICD-10-CM | POA: Diagnosis not present

## 2023-09-25 DIAGNOSIS — A419 Sepsis, unspecified organism: Secondary | ICD-10-CM | POA: Diagnosis not present

## 2023-09-25 LAB — GLUCOSE, CAPILLARY
Glucose-Capillary: 106 mg/dL — ABNORMAL HIGH (ref 70–99)
Glucose-Capillary: 103 mg/dL — ABNORMAL HIGH (ref 70–99)
Glucose-Capillary: 139 mg/dL — ABNORMAL HIGH (ref 70–99)

## 2023-09-25 LAB — URINE CULTURE: Culture: >=100000 — AB

## 2023-09-25 LAB — CULTURE, BLOOD (ROUTINE X 2)
Special Requests: ADEQUATE
Special Requests: ADEQUATE

## 2023-09-25 MED ORDER — DILTIAZEM HCL ER COATED BEADS 180 MG PO CP24
180.0000 mg | ORAL_CAPSULE | Freq: Every day | ORAL | Status: DC
  Administered 2023-09-25 – 2023-09-30 (×6): 180 mg via ORAL
  Filled 2023-09-25 (×5): qty 1

## 2023-09-25 NOTE — Progress Notes (Signed)
 PROGRESS NOTE   Gary Carroll  WGN:[id number] DOB: [birth year] DOA: 09/22/2023 PCP: [facility]   Chief Complaint  Patient presents with   Failure To Thrive   Level of care: Med-Surg  Brief Admission History:  78 y.o. male with medical history significant for severe AS status post TAVR, PAF on Eliquis , CAD, rectal adenocarcinoma, hypertension, dyslipidemia, type 2 diabetes, and chronic HFpEF who presented to the ED with weakness and inability to ambulate.  He was recently discharged to SNF on 4/8 with noted significant weakness at that time related to COVID-19 infection.  He was brought to ED via EMS who reported that his living conditions were quite bad and patient has been unable to ambulate well for a long period of time.  The call was made to EMS after he fell and he reported that he felt quite cold and could not get warm.   ED Course: Initial temperature 101.4 Fahrenheit with pulse rate of 101 and blood pressure stable.  Platelet count of 123,000 and other labs within normal limits.  Urine analysis suggesting UTI and patient was given 1 L fluid bolus as well as Rocephin .  Blood and urine cultures have been ordered and pending.   Assessment and Plan:  E coli Sepsis, POA, secondary to E coli and Enterobacter UTI E coli Bacteremia  - Monitor ongoing cultures with no growth noted on blood cultures thus far - Continue Rocephin  2 gram IV daily based on C&S data  - With generalized weakness associated, will reassess with PT/OT and place on fall precautions, SNF has been recommended by PT, TOC consulted to assist with placement    PAF/flutter -Currently stable on telemetry -Continue home diltiazem  and metoprolol  -apixaban  for anticoagulation   Mild transaminitis -Likely related to sepsis physiology, continue to trend and monitor -Holding statin for now   OSA -On CPAP   Hypertension -Continue home medications   Dyslipidemia - Hold statin given mild transaminitis    Chronic HFpEF -Currently appears euvolemic and given IV fluid bolus in ED, avoid further IV fluid for now   Type 2 diabetes -Currently without hyperglycemia -Continue metformin  -Carb diet -SSI  CBG (last 3)  Recent Labs    09/24/23 2051 09/25/23 0739 09/25/23 1144  GLUCAP 119* 106* 103*   Rectal adenocarcinoma -Follows with oncologist at [facility] and currently not on any further chemotherapy or radiation -Apparently not a candidate for surgery -Overall prognosis appears poor -Appreciate palliative consultation for goals of care discussion especially in light of readmission and what appears to be a failure to thrive picture   History of severe AS status post TAVR -Successful TAVR with 26 mm Edwards SAPIEN 3 THV on 02/25/2023   Obesity, class I -BMI 34.44   DVT prophylaxis: apixaban  resumed  Code Status: Full  Family Communication: patient at bedside Disposition: SNF when insurance authorizes   Consultants:   Procedures:   Antimicrobials:   Ceftriaxone  5/6>>  Subjective: Pt says he is agreeable to SNF rehab as he remains very weak and deconditioned.  He is worried about his cancer treatments.   Objective: Vitals:   09/24/23 2051 09/25/23 0603 09/25/23 1410 09/25/23 1628  BP: (!) 140/83 (!) 152/91 113/67 124/71  Pulse: (!) 102 (!) 103 76 96  Resp: 17 20 (!) 27 (!) 29  Temp: 98.3 F (36.8 C) (!) 97.3 F (36.3 C) 98.9 F (37.2 C) 99 F (37.2 C)  TempSrc: Oral Oral Oral Oral  SpO2: 98% 95% 94% 94%  Weight:  Height:        Intake/Output Summary (Last 24 hours) at 09/25/2023 1801 Last data filed at 09/25/2023 1731 Gross per 24 hour  Intake 1660 ml  Output 500 ml  Net 1160 ml   Filed Weights   09/22/23 2326 09/23/23 0807  Weight: 112 kg 107.1 kg   Examination:  General exam: Appears calm and frail, weak, sitting up in chair, NAD.  Respiratory system: Clear to auscultation. Respiratory effort normal. Cardiovascular system: normal S1 & S2 heard. No JVD,  murmurs, rubs, gallops or clicks. No pedal edema. Gastrointestinal system: Abdomen is nondistended, soft and nontender. No organomegaly or masses felt. Normal bowel sounds heard. Central nervous system: Alert and oriented. No focal neurological deficits. Extremities: Symmetric 5 x 5 power. Skin: No rashes, lesions or ulcers. Psychiatry: Judgement and insight appear normal. Mood & affect appropriate.   Data Reviewed: I have personally reviewed following labs and imaging studies  CBC: Recent Labs  Lab 09/23/23 0042 09/24/23 0502  WBC 5.4 5.0  NEUTROABS 4.4  --   HGB 13.0 12.2*  HCT 39.8 37.9*  MCV 91.9 89.6  PLT 123* 124*    Basic Metabolic Panel: Recent Labs  Lab 09/23/23 0042 09/24/23 0502  NA 137 132*  K 4.3 3.9  CL 105 103  CO2 23 22  GLUCOSE 120* 96  BUN 13 10  CREATININE 0.80 0.78  CALCIUM  8.5* 8.4*  MG  --  1.8    CBG: Recent Labs  Lab 09/24/23 1144 09/24/23 1619 09/24/23 2051 09/25/23 0739 09/25/23 1144  GLUCAP 105* 167* 119* 106* 103*    Recent Results (from the past 240 hours)  Blood culture (routine x 2)     Status: Abnormal   Collection Time: 09/23/23 12:42 AM   Specimen: BLOOD  Result Value Ref Range Status   Specimen Description   Final    BLOOD BLOOD RIGHT ARM Performed at [facility], [address] [place], [place] [address]    Special Requests   Final    BOTTLES DRAWN AEROBIC AND ANAEROBIC Blood Culture adequate volume Performed at [facility], [address] [place], [place] [address]    Culture  Setup Time   Final    IN BOTH AEROBIC AND ANAEROBIC BOTTLES GRAM NEGATIVE RODS Gram Stain Report Called to,Read Back By and Verified With: J [facility] AT 1306 09/23/23 BY A WILSON Performed at [facility], [address], [place], [place] [address]    Culture (A)  Final    ESCHERICHIA COLI SUSCEPTIBILITIES PERFORMED ON PREVIOUS CULTURE WITHIN THE LAST 5 DAYS. Performed at [facility], [address] [place], [place] [address]     Report Status 09/25/2023 FINAL  Final  Resp panel by RT-PCR (RSV, Flu A&B, Covid) Anterior Nasal Swab     Status: None   Collection Time: 09/23/23 12:51 AM   Specimen: Anterior Nasal Swab  Result Value Ref Range Status   SARS Coronavirus 2 by RT PCR NEGATIVE NEGATIVE Final    Comment: (NOTE) SARS-CoV-2 target nucleic acids are NOT DETECTED.  The SARS-CoV-2 RNA is generally detectable in upper respiratory specimens during the acute phase of infection. The lowest concentration of SARS-CoV-2 viral copies this assay can detect is 138 copies/mL. A negative result does not preclude SARS-Cov-2 infection and should not be used as the sole basis for treatment or other patient management decisions. A negative result may occur with  improper specimen collection/handling, submission of specimen other than nasopharyngeal swab, presence of viral mutation(s) within  the areas targeted by this assay, and inadequate number of viral copies(<138 copies/mL). A negative result must be combined with clinical observations, patient history, and epidemiological information. The expected result is Negative.  Fact Sheet for Patients:  BloggerCourse.com  Fact Sheet for Healthcare Providers:  SeriousBroker.it  This test is no t yet approved or cleared by the [place]  FDA and  has been authorized for detection and/or diagnosis of SARS-CoV-2 by FDA under an Emergency Use Authorization (EUA). This EUA will remain  in effect (meaning this test can be used) for the duration of the COVID-19 declaration under Section 564(b)(1) of the Act, 21 U.S.C.section 360bbb-3(b)(1), unless the authorization is terminated  or revoked sooner.       Influenza A by PCR NEGATIVE NEGATIVE Final   Influenza B by PCR NEGATIVE NEGATIVE Final    Comment: (NOTE) The Xpert Xpress SARS-CoV-2/FLU/RSV plus assay is intended as an aid in the diagnosis of influenza from Nasopharyngeal  swab specimens and should not be used as a sole basis for treatment. Nasal washings and aspirates are unacceptable for Xpert Xpress SARS-CoV-2/FLU/RSV testing.  Fact Sheet for Patients: BloggerCourse.com  Fact Sheet for Healthcare Providers: SeriousBroker.it  This test is not yet approved or cleared by the [place]  FDA and has been authorized for detection and/or diagnosis of SARS-CoV-2 by FDA under an Emergency Use Authorization (EUA). This EUA will remain in effect (meaning this test can be used) for the duration of the COVID-19 declaration under Section 564(b)(1) of the Act, 21 U.S.C. section 360bbb-3(b)(1), unless the authorization is terminated or revoked.     Resp Syncytial Virus by PCR NEGATIVE NEGATIVE Final    Comment: (NOTE) Fact Sheet for Patients: BloggerCourse.com  Fact Sheet for Healthcare Providers: SeriousBroker.it  This test is not yet approved or cleared by the [place]  FDA and has been authorized for detection and/or diagnosis of SARS-CoV-2 by FDA under an Emergency Use Authorization (EUA). This EUA will remain in effect (meaning this test can be used) for the duration of the COVID-19 declaration under Section 564(b)(1) of the Act, 21 U.S.C. section 360bbb-3(b)(1), unless the authorization is terminated or revoked.  Performed at [facility], [address], [place], [place] [address]   Blood culture (routine x 2)     Status: Abnormal   Collection Time: 09/23/23  1:30 AM   Specimen: BLOOD  Result Value Ref Range Status   Specimen Description   Final    BLOOD RIGHT ANTECUBITAL Performed at [facility], [address], [place], [place] [address]    Special Requests   Final    BOTTLES DRAWN AEROBIC AND ANAEROBIC Blood Culture adequate volume Performed at [facility], [address] [place], [place] [address]    Culture  Setup Time   Final    IN  BOTH AEROBIC AND ANAEROBIC BOTTLES GRAM NEGATIVE RODS Gram Stain Report Called to,Read Back By and Verified With: A JACKSON AT 1352 09/23/23 BY A WILSON CRITICAL RESULT CALLED TO, READ BACK BY AND VERIFIED WITH: RN JADA MUSE ON 09/23/23 @ 1935 BY DRT Performed at [facility], [address], [place], [place] [address]    Culture ESCHERICHIA COLI (A)  Final   Report Status 09/25/2023 FINAL  Final   Organism ID, Bacteria ESCHERICHIA COLI  Final   Organism ID, Bacteria ESCHERICHIA COLI  Final      Susceptibility   Escherichia coli - KIRBY BAUER*    CEFAZOLIN  RESISTANT Resistant    Escherichia coli - MIC*  AMPICILLIN >=32 RESISTANT Resistant     CEFEPIME  <=0.12 SENSITIVE Sensitive     CEFTAZIDIME 4 SENSITIVE Sensitive     CEFTRIAXONE  0.5 SENSITIVE Sensitive     CIPROFLOXACIN  <=0.25 SENSITIVE Sensitive     GENTAMICIN <=1 SENSITIVE Sensitive     IMIPENEM <=0.25 SENSITIVE Sensitive     TRIMETH/SULFA >=320 RESISTANT Resistant     AMPICILLIN/SULBACTAM >=32 RESISTANT Resistant     PIP/TAZO <=4 SENSITIVE Sensitive ug/mL    * ESCHERICHIA COLI    ESCHERICHIA COLI  Blood Culture ID Panel (Reflexed)     Status: Abnormal   Collection Time: 09/23/23  1:30 AM  Result Value Ref Range Status   Enterococcus faecalis NOT DETECTED NOT DETECTED Final   Enterococcus Faecium NOT DETECTED NOT DETECTED Final   Listeria monocytogenes NOT DETECTED NOT DETECTED Final   Staphylococcus species NOT DETECTED NOT DETECTED Final   Staphylococcus aureus (BCID) NOT DETECTED NOT DETECTED Final   Staphylococcus epidermidis NOT DETECTED NOT DETECTED Final   Staphylococcus lugdunensis NOT DETECTED NOT DETECTED Final   Streptococcus species NOT DETECTED NOT DETECTED Final   Streptococcus agalactiae NOT DETECTED NOT DETECTED Final   Streptococcus pneumoniae NOT DETECTED NOT DETECTED Final   Streptococcus pyogenes NOT DETECTED NOT DETECTED Final   A.calcoaceticus-baumannii NOT DETECTED NOT DETECTED Final    Bacteroides fragilis NOT DETECTED NOT DETECTED Final   Enterobacterales DETECTED (A) NOT DETECTED Final    Comment: Enterobacterales represent a large order of gram negative bacteria, not a single organism. CRITICAL RESULT CALLED TO, READ BACK BY AND VERIFIED WITH: RN JADA MUSE ON 09/23/23 @ 1935 BY DRT    Enterobacter cloacae complex NOT DETECTED NOT DETECTED Final   Escherichia coli DETECTED (A) NOT DETECTED Final    Comment: CRITICAL RESULT CALLED TO, READ BACK BY AND VERIFIED WITH: RN JADA MUSE ON 09/23/23 @ 1935 BY DRT    Klebsiella aerogenes NOT DETECTED NOT DETECTED Final   Klebsiella oxytoca NOT DETECTED NOT DETECTED Final   Klebsiella pneumoniae NOT DETECTED NOT DETECTED Final   Proteus species NOT DETECTED NOT DETECTED Final   Salmonella species NOT DETECTED NOT DETECTED Final   Serratia marcescens NOT DETECTED NOT DETECTED Final   Haemophilus influenzae NOT DETECTED NOT DETECTED Final   Neisseria meningitidis NOT DETECTED NOT DETECTED Final   Pseudomonas aeruginosa NOT DETECTED NOT DETECTED Final   Stenotrophomonas maltophilia NOT DETECTED NOT DETECTED Final   Candida albicans NOT DETECTED NOT DETECTED Final   Candida auris NOT DETECTED NOT DETECTED Final   Candida glabrata NOT DETECTED NOT DETECTED Final   Candida krusei NOT DETECTED NOT DETECTED Final   Candida parapsilosis NOT DETECTED NOT DETECTED Final   Candida tropicalis NOT DETECTED NOT DETECTED Final   Cryptococcus neoformans/gattii NOT DETECTED NOT DETECTED Final   CTX-M ESBL NOT DETECTED NOT DETECTED Final   Carbapenem resistance IMP NOT DETECTED NOT DETECTED Final   Carbapenem resistance KPC NOT DETECTED NOT DETECTED Final   Carbapenem resistance NDM NOT DETECTED NOT DETECTED Final   Carbapenem resist OXA 48 LIKE NOT DETECTED NOT DETECTED Final   Carbapenem resistance VIM NOT DETECTED NOT DETECTED Final    Comment: Performed at [facility], [address], [place], [place] [address]  Urine Culture      Status: Abnormal   Collection Time: 09/23/23  4:14 AM   Specimen: Urine, Random  Result Value Ref Range Status   Specimen Description   Final    URINE, RANDOM Performed at [facility], [address],  [place], [place] [address]    Special Requests   Final    NONE Reflexed from [id number] Performed at [facility], [address], [place], [place] [address]    Culture (A)  Final    >=100,000 COLONIES/mL ESCHERICHIA COLI 80,000 COLONIES/mL ENTEROCOCCUS FAECALIS    Report Status 09/25/2023 FINAL  Final   Organism ID, Bacteria ESCHERICHIA COLI (A)  Final   Organism ID, Bacteria ENTEROCOCCUS FAECALIS (A)  Final      Susceptibility   Escherichia coli - MIC*    AMPICILLIN 4 SENSITIVE Sensitive     CEFAZOLIN  <=4 SENSITIVE Sensitive     CEFEPIME  <=0.12 SENSITIVE Sensitive     CEFTRIAXONE  <=0.25 SENSITIVE Sensitive     CIPROFLOXACIN  <=0.25 SENSITIVE Sensitive     GENTAMICIN <=1 SENSITIVE Sensitive     IMIPENEM 0.5 SENSITIVE Sensitive     NITROFURANTOIN  <=16 SENSITIVE Sensitive     TRIMETH/SULFA >=320 RESISTANT Resistant     AMPICILLIN/SULBACTAM <=2 SENSITIVE Sensitive     PIP/TAZO <=4 SENSITIVE Sensitive ug/mL    * >=100,000 COLONIES/mL ESCHERICHIA COLI   Enterococcus faecalis - MIC*    AMPICILLIN <=2 SENSITIVE Sensitive     NITROFURANTOIN  <=16 SENSITIVE Sensitive     VANCOMYCIN  1 SENSITIVE Sensitive     * 80,000 COLONIES/mL ENTEROCOCCUS FAECALIS     Radiology Studies: No results found.   Scheduled Meds:  apixaban   5 mg Oral BID   diltiazem   180 mg Oral Daily   dutasteride   0.5 mg Oral Daily   losartan   50 mg Oral Daily   melatonin  10.5 mg Oral QHS   metoprolol  tartrate  25 mg Oral QHS   metoprolol  tartrate  50 mg Oral Daily   oxybutynin   5 mg Oral QPM   Continuous Infusions:  cefTRIAXone  (ROCEPHIN )  IV 2 g (09/25/23 0837)    LOS: 2 days   Time spent: 55 mins  Daelyn Mozer Lincoln Renshaw, MD How to contact the [facility] Attending or Consulting provider 7A - 7P or covering provider  during after hours 7P -7A, for this patient?  Check the care team in [facility] and look for a) attending/consulting [facility] provider listed and b) the [facility] team listed Log into [email or web address] to find provider on call.  Locate the [facility] provider you are looking for under [facility] and page to a number that you can be directly reached. If you still have difficulty reaching the provider, please page the [facility] (Director on Call) for the Hospitalists listed on amion for assistance.  09/25/2023, 6:01 PM

## 2023-09-25 NOTE — Plan of Care (Signed)

## 2023-09-25 NOTE — Progress Notes (Signed)
 Palliative: Chart review completed.  Face-to-face conference with bedside nursing staff and transition of care team.    At this point, Gary Carroll states he feels he needs short-term rehab.  Transition care team is working closely with patient for placement.  No additional needs at this time.  Would benefit from outpatient palliative services.  No charge Arla Lab, NP Palliative medicine team Team phone [phone]

## 2023-09-25 NOTE — TOC Progression Note (Signed)
 Transition of Care [facility]) - Progression Note    Patient Details  Name: Gary Carroll MRN: [id number] Date of Birth: [birth year]  Transition of [facility]) CM/SW Contact  Gary Binet, RN Phone Number: 09/25/2023, 3:23 PM  Clinical Narrative:   Discussed bed offers with patient, He does not care [facility] or Pauline Bos is fine, he just does not have the money for the copay. Margretta Shi is working to get approval for [employer] at [employer].     Expected Discharge Plan: Skilled Nursing Facility Barriers to Discharge: Continued Medical Work up  Expected Discharge Plan and Services      Living arrangements for the past 2 months: Apartment          Social Determinants of Health (SDOH) Interventions SDOH Screenings   Food Insecurity: No Food Insecurity (09/23/2023)  Housing: Low Risk  (09/23/2023)  Transportation Needs: No Transportation Needs (09/23/2023)  Utilities: Not At Risk (09/23/2023)  Alcohol Screen: Low Risk  (02/12/2023)  Depression (PHQ2-9): Low Risk  (02/12/2023)  Social Connections: Moderately Isolated (09/23/2023)  Tobacco Use: Medium Risk (09/24/2023)  Health Literacy: Adequate Health Literacy (03/07/2023)    Readmission Risk Interventions    09/24/2023    2:43 PM 02/27/2023   11:39 AM 02/07/2021    1:55 PM  Readmission Risk Prevention Plan  Transportation Screening Complete Complete Complete  PCP or Specialist Appt within 5-7 Days   Complete  PCP or Specialist Appt within 3-5 Days Not Complete    Home Care Screening   Complete  Medication Review (RN CM)   Referral to Pharmacy  HRI or Home Care Consult  Complete   Social Work Consult for Recovery Care Planning/Counseling Complete Complete   Palliative Care Screening Not Applicable Not Applicable   Medication Review Oceanographer) Complete Complete

## 2023-09-25 NOTE — Plan of Care (Signed)

## 2023-09-26 ENCOUNTER — Encounter: Payer: Self-pay | Admitting: *Deleted

## 2023-09-26 ENCOUNTER — Other Ambulatory Visit: Payer: Self-pay | Admitting: *Deleted

## 2023-09-26 ENCOUNTER — Other Ambulatory Visit: Payer: Self-pay

## 2023-09-26 DIAGNOSIS — E782 Mixed hyperlipidemia: Secondary | ICD-10-CM | POA: Diagnosis not present

## 2023-09-26 DIAGNOSIS — I5032 Chronic diastolic (congestive) heart failure: Secondary | ICD-10-CM | POA: Diagnosis not present

## 2023-09-26 DIAGNOSIS — Z659 Problem related to unspecified psychosocial circumstances: Secondary | ICD-10-CM | POA: Insufficient documentation

## 2023-09-26 DIAGNOSIS — J449 Chronic obstructive pulmonary disease, unspecified: Secondary | ICD-10-CM | POA: Diagnosis not present

## 2023-09-26 DIAGNOSIS — F4321 Adjustment disorder with depressed mood: Secondary | ICD-10-CM | POA: Insufficient documentation

## 2023-09-26 DIAGNOSIS — A419 Sepsis, unspecified organism: Secondary | ICD-10-CM | POA: Diagnosis not present

## 2023-09-26 LAB — GLUCOSE, CAPILLARY
Glucose-Capillary: 132 mg/dL — ABNORMAL HIGH (ref 70–99)
Glucose-Capillary: 153 mg/dL — ABNORMAL HIGH (ref 70–99)
Glucose-Capillary: 115 mg/dL — ABNORMAL HIGH (ref 70–99)
Glucose-Capillary: 136 mg/dL — ABNORMAL HIGH (ref 70–99)

## 2023-09-26 NOTE — Progress Notes (Signed)
 PROGRESS NOTE   Gary Carroll  VHQ:[id number] DOB: [birth year] DOA: 09/22/2023 PCP: [facility]   Chief Complaint  Patient presents with   Failure To Thrive   Level of care: Med-Surg  Brief Admission History:  78 y.o. male with medical history significant for severe AS status post TAVR, PAF on Eliquis , CAD, rectal adenocarcinoma, hypertension, dyslipidemia, type 2 diabetes, and chronic HFpEF who presented to the ED with weakness and inability to ambulate.  He was recently discharged to SNF on 4/8 with noted significant weakness at that time related to COVID-19 infection.  He was brought to ED via EMS who reported that his living conditions were quite bad and patient has been unable to ambulate well for a long period of time.  The call was made to EMS after he fell and he reported that he felt quite cold and could not get warm.   ED Course: Initial temperature 101.4 Fahrenheit with pulse rate of 101 and blood pressure stable.  Platelet count of 123,000 and other labs within normal limits.  Urine analysis suggesting UTI and patient was given 1 L fluid bolus as well as Rocephin .  Blood and urine cultures have been ordered and pending.   Assessment and Plan:  E coli Sepsis, POA, secondary to E coli and Enterobacter UTI E coli Bacteremia  - Monitor ongoing cultures with no growth noted on blood cultures thus far - Continue Rocephin  2 gram IV daily based on C&S data, with plan to stop at 7 days if still in hospital - With generalized weakness associated, will reassess with PT/OT and place on fall precautions, SNF has been recommended by PT, TOC consulted to assist with placement - awaiting insurance auth   PAF/flutter -Currently stable on telemetry -Continue home diltiazem  and metoprolol  -apixaban  for anticoagulation   Mild transaminitis -Likely related to sepsis physiology, continue to trend and monitor -Holding statin for now   OSA -On CPAP   Hypertension -Continue  home medications   Dyslipidemia - Hold statin given mild transaminitis   Chronic HFpEF -Currently appears euvolemic and given IV fluid bolus in ED, avoid further IV fluid for now   Type 2 diabetes -Currently without hyperglycemia -Continue metformin  -Carb diet -SSI  CBG (last 3)  Recent Labs    09/25/23 2046 09/26/23 0736 09/26/23 1123  GLUCAP 139* 132* 153*   Rectal adenocarcinoma -Follows with oncologist at [facility] and currently not on any further chemotherapy or radiation -Apparently not a candidate for surgery -Overall prognosis appears poor -Appreciate palliative consultation for goals of care discussion especially in light of readmission and what appears to be a failure to thrive picture   History of severe AS status post TAVR -Successful TAVR with 26 mm Edwards SAPIEN 3 THV on 02/25/2023   Obesity, class I -BMI 34.44   DVT prophylaxis: apixaban  resumed  Code Status: Full  Family Communication: patient at bedside Disposition: SNF when insurance authorizes   Consultants:   Procedures:   Antimicrobials:   Ceftriaxone  5/6>>  Subjective: Pt says he is agreeable to SNF rehab as he remains very weak and deconditioned.  He is worried about his cancer treatments.   Objective: Vitals:   09/25/23 1410 09/25/23 1628 09/25/23 2016 09/26/23 0607  BP: 113/67 124/71 130/67 131/85  Pulse: 76 96 84 67  Resp: (!) 27 (!) 29 18 20   Temp: 98.9 F (37.2 C) 99 F (37.2 C) 98.3 F (36.8 C) 97.6 F (36.4 C)  TempSrc: Oral Oral Oral Oral  SpO2: 94%  94% 96% 94%  Weight:      Height:        Intake/Output Summary (Last 24 hours) at 09/26/2023 1451 Last data filed at 09/26/2023 1209 Gross per 24 hour  Intake 840 ml  Output 850 ml  Net -10 ml   Filed Weights   09/22/23 2326 09/23/23 0807  Weight: 112 kg 107.1 kg   Examination:  General exam: Appears calm and frail, weak, sitting up in chair, NAD.  Respiratory system: Clear to auscultation. Respiratory effort  normal. Cardiovascular system: normal S1 & S2 heard. No JVD, murmurs, rubs, gallops or clicks. No pedal edema. Gastrointestinal system: Abdomen is nondistended, soft and nontender. No organomegaly or masses felt. Normal bowel sounds heard. Central nervous system: Alert and oriented. No focal neurological deficits. Extremities: Symmetric 5 x 5 power. Skin: No rashes, lesions or ulcers. Psychiatry: Judgement and insight appear normal. Mood & affect appropriate.   Data Reviewed: I have personally reviewed following labs and imaging studies  CBC: Recent Labs  Lab 09/23/23 0042 09/24/23 0502  WBC 5.4 5.0  NEUTROABS 4.4  --   HGB 13.0 12.2*  HCT 39.8 37.9*  MCV 91.9 89.6  PLT 123* 124*    Basic Metabolic Panel: Recent Labs  Lab 09/23/23 0042 09/24/23 0502  NA 137 132*  K 4.3 3.9  CL 105 103  CO2 23 22  GLUCOSE 120* 96  BUN 13 10  CREATININE 0.80 0.78  CALCIUM  8.5* 8.4*  MG  --  1.8    CBG: Recent Labs  Lab 09/25/23 0739 09/25/23 1144 09/25/23 2046 09/26/23 0736 09/26/23 1123  GLUCAP 106* 103* 139* 132* 153*    Recent Results (from the past 240 hours)  Blood culture (routine x 2)     Status: Abnormal   Collection Time: 09/23/23 12:42 AM   Specimen: BLOOD  Result Value Ref Range Status   Specimen Description   Final    BLOOD BLOOD RIGHT ARM Performed at [facility], [address], [place], [place] [address]    Special Requests   Final    BOTTLES DRAWN AEROBIC AND ANAEROBIC Blood Culture adequate volume Performed at [facility], [address] [place], [place] [address]    Culture  Setup Time   Final    IN BOTH AEROBIC AND ANAEROBIC BOTTLES GRAM NEGATIVE RODS Gram Stain Report Called to,Read Back By and Verified With: [facility] AT 1306 09/23/23 BY A WILSON Performed at [facility], [address], [place], [place] [address]    Culture (A)  Final    ESCHERICHIA COLI SUSCEPTIBILITIES PERFORMED ON PREVIOUS CULTURE WITHIN THE LAST 5 DAYS. Performed at [facility], [address], [place], [place] [address]    Report Status 09/25/2023 FINAL  Final  Resp panel by RT-PCR (RSV, Flu A&B, Covid) Anterior Nasal Swab     Status: None   Collection Time: 09/23/23 12:51 AM   Specimen: Anterior Nasal Swab  Result Value Ref Range Status   SARS Coronavirus 2 by RT PCR NEGATIVE NEGATIVE Final    Comment: (NOTE) SARS-CoV-2 target nucleic acids are NOT DETECTED.  The SARS-CoV-2 RNA is generally detectable in upper respiratory specimens during the acute phase of infection. The lowest concentration of SARS-CoV-2 viral copies this assay can detect is 138 copies/mL. A negative result does not preclude SARS-Cov-2 infection and should not be used as the sole basis for treatment or other patient management decisions. A negative result may occur with  improper specimen collection/handling, submission of  specimen other than nasopharyngeal swab, presence of viral mutation(s) within the areas targeted by this assay, and inadequate number of viral copies(<138 copies/mL). A negative result must be combined with clinical observations, patient history, and epidemiological information. The expected result is Negative.  Fact Sheet for Patients:  BloggerCourse.com  Fact Sheet for Healthcare Providers:  SeriousBroker.it  This test is no t yet approved or cleared by the [place]  FDA and  has been authorized for detection and/or diagnosis of SARS-CoV-2 by FDA under an Emergency Use Authorization (EUA). This EUA will remain  in effect (meaning this test can be used) for the duration of the COVID-19 declaration under Section 564(b)(1) of the Act, 21 U.S.C.section 360bbb-3(b)(1), unless the authorization is terminated  or revoked sooner.       Influenza A by PCR NEGATIVE NEGATIVE Final   Influenza B by PCR NEGATIVE NEGATIVE Final    Comment: (NOTE) The Xpert Xpress SARS-CoV-2/FLU/RSV plus assay is intended  as an aid in the diagnosis of influenza from Nasopharyngeal swab specimens and should not be used as a sole basis for treatment. Nasal washings and aspirates are unacceptable for Xpert Xpress SARS-CoV-2/FLU/RSV testing.  Fact Sheet for Patients: BloggerCourse.com  Fact Sheet for Healthcare Providers: SeriousBroker.it  This test is not yet approved or cleared by the [place]  FDA and has been authorized for detection and/or diagnosis of SARS-CoV-2 by FDA under an Emergency Use Authorization (EUA). This EUA will remain in effect (meaning this test can be used) for the duration of the COVID-19 declaration under Section 564(b)(1) of the Act, 21 U.S.C. section 360bbb-3(b)(1), unless the authorization is terminated or revoked.     Resp Syncytial Virus by PCR NEGATIVE NEGATIVE Final    Comment: (NOTE) Fact Sheet for Patients: BloggerCourse.com  Fact Sheet for Healthcare Providers: SeriousBroker.it  This test is not yet approved or cleared by the [place]  FDA and has been authorized for detection and/or diagnosis of SARS-CoV-2 by FDA under an Emergency Use Authorization (EUA). This EUA will remain in effect (meaning this test can be used) for the duration of the COVID-19 declaration under Section 564(b)(1) of the Act, 21 U.S.C. section 360bbb-3(b)(1), unless the authorization is terminated or revoked.  Performed at [facility], [address] [place], [place] [address]   Blood culture (routine x 2)     Status: Abnormal   Collection Time: 09/23/23  1:30 AM   Specimen: BLOOD  Result Value Ref Range Status   Specimen Description   Final    BLOOD RIGHT ANTECUBITAL Performed at [facility], [address], [place], [place] [address]    Special Requests   Final    BOTTLES DRAWN AEROBIC AND ANAEROBIC Blood Culture adequate volume Performed at [facility], [address]  [place], [place] [address]    Culture  Setup Time   Final    IN BOTH AEROBIC AND ANAEROBIC BOTTLES GRAM NEGATIVE RODS Gram Stain Report Called to,Read Back By and Verified With: A JACKSON AT 1352 09/23/23 BY A WILSON CRITICAL RESULT CALLED TO, READ BACK BY AND VERIFIED WITH: RN JADA MUSE ON 09/23/23 @ 1935 BY DRT Performed at [facility], [address], [place], [place] [address]    Culture ESCHERICHIA COLI (A)  Final   Report Status 09/25/2023 FINAL  Final   Organism ID, Bacteria ESCHERICHIA COLI  Final   Organism ID, Bacteria ESCHERICHIA COLI  Final      Susceptibility   Escherichia coli - KIRBY BAUER*    CEFAZOLIN   RESISTANT Resistant    Escherichia coli - MIC*    AMPICILLIN >=32 RESISTANT Resistant     CEFEPIME  <=0.12 SENSITIVE Sensitive     CEFTAZIDIME 4 SENSITIVE Sensitive     CEFTRIAXONE  0.5 SENSITIVE Sensitive     CIPROFLOXACIN  <=0.25 SENSITIVE Sensitive     GENTAMICIN <=1 SENSITIVE Sensitive     IMIPENEM <=0.25 SENSITIVE Sensitive     TRIMETH/SULFA >=320 RESISTANT Resistant     AMPICILLIN/SULBACTAM >=32 RESISTANT Resistant     PIP/TAZO <=4 SENSITIVE Sensitive ug/mL    * ESCHERICHIA COLI    ESCHERICHIA COLI  Blood Culture ID Panel (Reflexed)     Status: Abnormal   Collection Time: 09/23/23  1:30 AM  Result Value Ref Range Status   Enterococcus faecalis NOT DETECTED NOT DETECTED Final   Enterococcus Faecium NOT DETECTED NOT DETECTED Final   Listeria monocytogenes NOT DETECTED NOT DETECTED Final   Staphylococcus species NOT DETECTED NOT DETECTED Final   Staphylococcus aureus (BCID) NOT DETECTED NOT DETECTED Final   Staphylococcus epidermidis NOT DETECTED NOT DETECTED Final   Staphylococcus lugdunensis NOT DETECTED NOT DETECTED Final   Streptococcus species NOT DETECTED NOT DETECTED Final   Streptococcus agalactiae NOT DETECTED NOT DETECTED Final   Streptococcus pneumoniae NOT DETECTED NOT DETECTED Final   Streptococcus pyogenes NOT DETECTED NOT DETECTED Final    A.calcoaceticus-baumannii NOT DETECTED NOT DETECTED Final   Bacteroides fragilis NOT DETECTED NOT DETECTED Final   Enterobacterales DETECTED (A) NOT DETECTED Final    Comment: Enterobacterales represent a large order of gram negative bacteria, not a single organism. CRITICAL RESULT CALLED TO, READ BACK BY AND VERIFIED WITH: RN JADA MUSE ON 09/23/23 @ 1935 BY DRT    Enterobacter cloacae complex NOT DETECTED NOT DETECTED Final   Escherichia coli DETECTED (A) NOT DETECTED Final    Comment: CRITICAL RESULT CALLED TO, READ BACK BY AND VERIFIED WITH: RN JADA MUSE ON 09/23/23 @ 1935 BY DRT    Klebsiella aerogenes NOT DETECTED NOT DETECTED Final   Klebsiella oxytoca NOT DETECTED NOT DETECTED Final   Klebsiella pneumoniae NOT DETECTED NOT DETECTED Final   Proteus species NOT DETECTED NOT DETECTED Final   Salmonella species NOT DETECTED NOT DETECTED Final   Serratia marcescens NOT DETECTED NOT DETECTED Final   Haemophilus influenzae NOT DETECTED NOT DETECTED Final   Neisseria meningitidis NOT DETECTED NOT DETECTED Final   Pseudomonas aeruginosa NOT DETECTED NOT DETECTED Final   Stenotrophomonas maltophilia NOT DETECTED NOT DETECTED Final   Candida albicans NOT DETECTED NOT DETECTED Final   Candida auris NOT DETECTED NOT DETECTED Final   Candida glabrata NOT DETECTED NOT DETECTED Final   Candida krusei NOT DETECTED NOT DETECTED Final   Candida parapsilosis NOT DETECTED NOT DETECTED Final   Candida tropicalis NOT DETECTED NOT DETECTED Final   Cryptococcus neoformans/gattii NOT DETECTED NOT DETECTED Final   CTX-M ESBL NOT DETECTED NOT DETECTED Final   Carbapenem resistance IMP NOT DETECTED NOT DETECTED Final   Carbapenem resistance KPC NOT DETECTED NOT DETECTED Final   Carbapenem resistance NDM NOT DETECTED NOT DETECTED Final   Carbapenem resist OXA 48 LIKE NOT DETECTED NOT DETECTED Final   Carbapenem resistance VIM NOT DETECTED NOT DETECTED Final    Comment: Performed at [facility],  [address], [place], [place] [address]  Urine Culture     Status: Abnormal   Collection Time: 09/23/23  4:14 AM   Specimen: Urine, Random  Result Value Ref Range Status   Specimen Description   Final  URINE, RANDOM Performed at [facility], [address] [place], [place] [address]    Special Requests   Final    NONE Reflexed from [id number] Performed at [facility], [address] [place], [place] [address]    Culture (A)  Final    >=100,000 COLONIES/mL ESCHERICHIA COLI 80,000 COLONIES/mL ENTEROCOCCUS FAECALIS    Report Status 09/25/2023 FINAL  Final   Organism ID, Bacteria ESCHERICHIA COLI (A)  Final   Organism ID, Bacteria ENTEROCOCCUS FAECALIS (A)  Final      Susceptibility   Escherichia coli - MIC*    AMPICILLIN 4 SENSITIVE Sensitive     CEFAZOLIN  <=4 SENSITIVE Sensitive     CEFEPIME  <=0.12 SENSITIVE Sensitive     CEFTRIAXONE  <=0.25 SENSITIVE Sensitive     CIPROFLOXACIN  <=0.25 SENSITIVE Sensitive     GENTAMICIN <=1 SENSITIVE Sensitive     IMIPENEM 0.5 SENSITIVE Sensitive     NITROFURANTOIN  <=16 SENSITIVE Sensitive     TRIMETH/SULFA >=320 RESISTANT Resistant     AMPICILLIN/SULBACTAM <=2 SENSITIVE Sensitive     PIP/TAZO <=4 SENSITIVE Sensitive ug/mL    * >=100,000 COLONIES/mL ESCHERICHIA COLI   Enterococcus faecalis - MIC*    AMPICILLIN <=2 SENSITIVE Sensitive     NITROFURANTOIN  <=16 SENSITIVE Sensitive     VANCOMYCIN  1 SENSITIVE Sensitive     * 80,000 COLONIES/mL ENTEROCOCCUS FAECALIS     Radiology Studies: No results found.   Scheduled Meds:  apixaban   5 mg Oral BID   diltiazem   180 mg Oral Daily   dutasteride   0.5 mg Oral Daily   losartan   50 mg Oral Daily   melatonin  10.5 mg Oral QHS   metoprolol  tartrate  25 mg Oral QHS   metoprolol  tartrate  50 mg Oral Daily   oxybutynin   5 mg Oral QPM   Continuous Infusions:  cefTRIAXone  (ROCEPHIN )  IV 2 g (09/26/23 0814)    LOS: 3 days   Time spent: 55 mins  Thom Ollinger Lincoln Renshaw, MD How to contact the [facility]  Attending or Consulting provider 7A - 7P or covering provider during after hours 7P -7A, for this patient?  Check the care team in [facility] and look for a) attending/consulting [facility] provider listed and b) the [facility] team listed Log into [email or web address] to find provider on call.  Locate the [facility] provider you are looking for under [facility] and page to a number that you can be directly reached. If you still have difficulty reaching the provider, please page the [facility] (Director on Call) for the Hospitalists listed on amion for assistance.  09/26/2023, 2:51 PM

## 2023-09-26 NOTE — Plan of Care (Signed)

## 2023-09-26 NOTE — Patient Outreach (Signed)
 Complex Care Management   Visit Note  09/26/2023  Name:  Gary Carroll MRN: [id number] DOB: 25-Apr-1946  Situation: Referral received for Complex Care Management related to  s/p TAVR replacement from [facility] hospital Liaison, Donnald Fuss  I obtained verbal consent from Patient.  Visit completed with Gatha Kaska Golden  on the phone  Background:   Past Medical History:  Diagnosis Date   A-fib [facility])    Allergic rhinitis    Arthritis    Asthma    seasonal per pt   Bilateral knee pain    OA   CAD (coronary artery disease)    Diabetes mellitus without complication (HCC)    Diverticulosis 02/2012   seen on colonoscopy   Erectile dysfunction    GERD (gastroesophageal reflux disease)    Hiatal hernia    HLD (hyperlipidemia)    HTN (hypertension)    Hydronephrosis determined by ultrasound 08/22/2015   Overactive bladder    Prediabetes    Pulmonary lesion    Ringing of ears    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 Arlester Ladd and Dr. Sherene Dilling   Severe aortic stenosis    Sleep apnea    Urinary frequency    Urinary urgency    Wears glasses     Assessment: Patient Reported Symptoms:  Cognitive Cognitive Status: Alert and oriented to person, place, and time, Normal speech and language skills   Health Maintenance Behaviors: Annual physical exam, Hobbies, Sleep adequate Health Facilitated by: Rest  Neurological Neurological Review of Symptoms: Weakness Neurological Conditions:  (none) Neurological Management Strategies: Adequate rest, Routine screening Neurological Self-Management Outcome: 3 (uncertain)  HEENT HEENT Symptoms Reported: No symptoms reported HEENT Conditions: Vision problem(s) Vision Problems: blindness/vision loss HEENT Management Strategies: Adequate rest, Routine screening, Medical device HEENT Self-Management Outcome: 4 (good) Vision problem(s)  Cardiovascular Cardiovascular Symptoms Reported: Fatigue Does  patient have uncontrolled Hypertension?: No Cardiovascular Conditions: Coronary artery disease, Dysrhythmia, Heart failure, High blood cholesterol, Hypertension, Valvular disease Cardiovascular Management Strategies: Adequate rest, Coping strategies, Medical device, Medication therapy, Routine screening Weight: 236 lb (107 kg) Cardiovascular Self-Management Outcome: 3 (uncertain)  Respiratory Respiratory Symptoms Reported: Shortness of breath Other Respiratory Symptoms: hx of covid 19 admission in april 2025 Respiratory Conditions: Asthma, Seasonal allergies, Sleep disordered breathing Respiratory Self-Management Outcome: 3 (uncertain)  Endocrine Patient reports the following symptoms related to hypoglycemia or hyperglycemia : Weakness or fatigue Is patient diabetic?: Yes Is patient checking blood sugars at home?: No Endocrine Conditions: Diabetes Endocrine Management Strategies: Adequate rest, Medication therapy, Routine screening Endocrine Self-Management Outcome: 4 (good)  Gastrointestinal Gastrointestinal Symptoms Reported: Diarrhea Gastrointestinal Conditions: Diarrhea, Reflux/heartburn, Constipation, Other Other Gastrointestinal Conditions: hemorrhoids Gastrointestinal Management Strategies: Adequate rest, Coping strategies, Diet modification Gastrointestinal Self-Management Outcome: 3 (uncertain) Nutrition Risk Screen (CP): No indicators present  Genitourinary Genitourinary Symptoms Reported: Frequency, Urgency Genitourinary Conditions: Neurogenic bladder, Urinary tract infection, Frequency Genitourinary Management Strategies: Adequate rest, Incontinence garment/pad Genitourinary Self-Management Outcome: 3 (uncertain)  Integumentary Integumentary Symptoms Reported: No symptoms reported Skin Management Strategies: Adequate rest, Routine screening Skin Self-Management Outcome: 3 (uncertain)  Musculoskeletal Musculoskelatal Symptoms Reviewed: Weakness Musculoskeletal Conditions:  Joint pain, Mobility limited, Unsteady gait, Osteoarthritis Musculoskeletal Management Strategies: Adequate rest, Routine screening Musculoskeletal Self-Management Outcome: 3 (uncertain) Falls in the past year?: Yes Number of falls in past year: 1 or less Was there an injury with Fall?: No Fall Risk Category Calculator: 1 Patient Fall Risk Level: Low Fall Risk Patient at Risk for Falls Due to: History  of fall(s), Impaired balance/gait, Impaired mobility, Impaired vision Fall risk Follow up: Falls evaluation completed  Psychosocial Psychosocial Symptoms Reported: No symptoms reported Behavioral Health Conditions:  (none) Behavioral Management Strategies: Adequate rest, Coping strategies, Community resources, Personnel officer Health Self-Management Outcome: 4 (good) Major Change/Loss/Stressor/Fears (CP): Medical condition, self, Resources Techniques to [employer] with Loss/Stress/Change: Diversional activities Quality of Family Relationships: helpful, involved, supportive Do you feel physically threatened by others?: No      09/26/2023   12:30 PM  Depression screen PHQ 2/9  Decreased Interest 0  Down, Depressed, Hopeless 3  PHQ - 2 Score 3  Altered sleeping 1  Tired, decreased energy 1  Change in appetite 0  Feeling bad or failure about yourself  0  Trouble concentrating 0  Moving slowly or fidgety/restless 0  PHQ-9 Score 5    There were no vitals filed for this visit.  Medications Reviewed Today   Medications were not reviewed in this encounter     Recommendation:   PCP Follow-up Continue to work with hospital staff, change incontinence items every 2 hours, remain hydrated, clean & dry  Follow Up Plan:   Telephone follow up appointment date/time:  10/10/23 1 pm  Ronnae Kaser L. Mcarthur Speedy, RN, BSN, CCM [place]  [facility], [facility] Health RN Care Manager Direct Dial: [phone]  Fax: [phone]

## 2023-09-26 NOTE — Patient Instructions (Signed)
 Visit Information  Thank you for taking time to visit with me today. Please don't hesitate to contact me if I can be of assistance to you before our next scheduled appointment.  Your next care management appointment is by telephone on 10/10/23 at 1 pm  Remember to go to bathroom every 2 hours, stay hydrated. Will check on you after your hospital discharge  Please call the care guide team at [phone] if you need to cancel, schedule, or reschedule an appointment.   Please call the Suicide and Crisis Lifeline: 988 call the [place]  National Suicide Prevention Lifeline: (858)886-1602 or TTY: (872) 756-8839 TTY (402)808-2733) to talk to a trained counselor call 1-800-273-TALK (toll free, 24 hour hotline) call the [facility]: (614)759-0127 call 911 if you are experiencing a Mental Health or Behavioral Health Crisis or need someone to talk to.  Evolett Somarriba L. Mcarthur Speedy, RN, BSN, CCM [place]  [facility], [facility] Health RN Care Manager Direct Dial: [phone]  Fax: [phone]

## 2023-09-26 NOTE — TOC Progression Note (Signed)
 Transition of Care [facility]) - Progression Note    Patient Details  Name: Gary Carroll MRN: [id number] Date of Birth: [birth year]  Transition of Care [facility]) CM/SW Contact  Linnea Richards, LCSW Phone Number: 09/26/2023, 2:17 PM  Clinical Narrative:     TOC following. Spoke with pt to review dc planning. Explained copay amount for SNF at [facility]. Pt states that he would like to see if he can get into the [place] rehab in [place].   TOC spoke with [facility] SW, Kathlin Neamo, to inquire on pt's eligibility for [place]. Per Kathlin, since pt is in copay days under his Medicare coverage, he would be eligible to be considered for [facility] SNF rehab.   TOC faxed requested documentation to the [facility] CLC for review. Was informed that this will be reviewed Monday.  Pt is approved by his Paramedic for SNF at [facility]. If [facility] not [facility] SN[facility], pt agreeable to go to [facility].   Updated MD. Stella Edward will follow.  Expected Discharge Plan: Skilled Nursing Facility Barriers to Discharge: Continued Medical Work up  Expected Discharge Plan and Services       Living arrangements for the past 2 months: Apartment                                       Social Determinants of Health (SDOH) Interventions SDOH Screenings   Food Insecurity: No Food Insecurity (09/23/2023)  Housing: Low Risk  (09/23/2023)  Transportation Needs: No Transportation Needs (09/23/2023)  Utilities: Not At Risk (09/23/2023)  Alcohol Screen: Low Risk  (02/12/2023)  Depression (PHQ2-9): Medium Risk (09/26/2023)  Financial Resource Strain: Low Risk  (09/26/2023)  Physical Activity: Inactive (09/26/2023)  Social Connections: Moderately Isolated (09/23/2023)  Stress: No Stress Concern Present (09/26/2023)  Tobacco Use: Medium Risk (09/24/2023)  Health Literacy: Adequate Health Literacy (03/07/2023)    Readmission Risk Interventions    09/24/2023    2:43 PM 02/27/2023   11:39 AM 02/07/2021    1:55 PM  Readmission Risk  Prevention Plan  Transportation Screening Complete Complete Complete  PCP or Specialist Appt within 5-7 Days   Complete  PCP or Specialist Appt within 3-5 Days Not Complete    Home Care Screening   Complete  Medication Review (RN CM)   Referral to Pharmacy  HRI or Home Care Consult  Complete   Social Work Consult for Recovery Care Planning/Counseling Complete Complete   Palliative Care Screening Not Applicable Not Applicable   Medication Review Oceanographer) Complete Complete

## 2023-09-26 NOTE — Plan of Care (Signed)
  Problem: Clinical Measurements: Goal: Ability to maintain clinical measurements within normal limits will improve Outcome: Progressing Goal: Diagnostic test results will improve Outcome: Progressing   Problem: Nutrition: Goal: Adequate nutrition will be maintained Outcome: Progressing   

## 2023-09-27 DIAGNOSIS — J449 Chronic obstructive pulmonary disease, unspecified: Secondary | ICD-10-CM | POA: Diagnosis not present

## 2023-09-27 DIAGNOSIS — I5032 Chronic diastolic (congestive) heart failure: Secondary | ICD-10-CM | POA: Diagnosis not present

## 2023-09-27 DIAGNOSIS — N390 Urinary tract infection, site not specified: Secondary | ICD-10-CM | POA: Diagnosis not present

## 2023-09-27 DIAGNOSIS — A419 Sepsis, unspecified organism: Secondary | ICD-10-CM | POA: Diagnosis not present

## 2023-09-27 LAB — GLUCOSE, CAPILLARY
Glucose-Capillary: 120 mg/dL — ABNORMAL HIGH (ref 70–99)
Glucose-Capillary: 114 mg/dL — ABNORMAL HIGH (ref 70–99)
Glucose-Capillary: 132 mg/dL — ABNORMAL HIGH (ref 70–99)
Glucose-Capillary: 129 mg/dL — ABNORMAL HIGH (ref 70–99)

## 2023-09-27 NOTE — Plan of Care (Signed)

## 2023-09-27 NOTE — Progress Notes (Signed)
 PROGRESS NOTE   Gary Carroll  JXB:[id number] DOB: 28-Feb-1946 DOA: 09/22/2023 PCP: [facility]   Chief Complaint  Patient presents with   Failure To Thrive   Level of care: Med-Surg  Brief Admission History:  78 y.o. male with medical history significant for severe AS status post TAVR, PAF on Eliquis , CAD, rectal adenocarcinoma, hypertension, dyslipidemia, type 2 diabetes, and chronic HFpEF who presented to the ED with weakness and inability to ambulate.  He was recently discharged to SNF on 4/8 with noted significant weakness at that time related to COVID-19 infection.  He was brought to ED via EMS who reported that his living conditions were quite bad and patient has been unable to ambulate well for a long period of time.  The call was made to EMS after he fell and he reported that he felt quite cold and could not get warm.   ED Course: Initial temperature 101.4 Fahrenheit with pulse rate of 101 and blood pressure stable.  Platelet count of 123,000 and other labs within normal limits.  Urine analysis suggesting UTI and patient was given 1 L fluid bolus as well as Rocephin .  Blood and urine cultures have been ordered and pending.   Assessment and Plan:  E coli Sepsis, POA, secondary to E coli and Enterobacter UTI E coli Bacteremia  - Monitor ongoing cultures with no growth noted on blood cultures thus far - Continue Rocephin  2 gram IV daily based on C&S data, with plan to stop at 7 days if still in hospital - With generalized weakness associated, will reassess with PT/OT and place on fall precautions, SNF has been recommended by PT, TOC consulted to assist with placement - awaiting insurance auth   PAF/flutter -Currently stable on telemetry -Continue home diltiazem  and metoprolol  -apixaban  for anticoagulation   Mild transaminitis -Likely related to sepsis physiology, continue to trend and monitor -Holding statin for now   OSA -On CPAP   Hypertension -Continue  home medications   Dyslipidemia - Hold statin given mild transaminitis   Chronic HFpEF -Currently appears euvolemic and given IV fluid bolus in ED, avoid further IV fluid for now   Type 2 diabetes -Currently without hyperglycemia -Continue metformin  -Carb diet -SSI  CBG (last 3)  Recent Labs    09/26/23 1954 09/27/23 0805 09/27/23 1111  GLUCAP 136* 120* 114*   Rectal adenocarcinoma -Follows with oncologist at [facility] and currently not on any further chemotherapy or radiation -Apparently not a candidate for surgery -Overall prognosis appears poor -Appreciate palliative consultation for goals of care discussion especially in light of readmission and what appears to be a failure to thrive picture   History of severe AS status post TAVR -Successful TAVR with 26 mm Edwards SAPIEN 3 THV on 02/25/2023   Obesity, class I -BMI 34.44   DVT prophylaxis: apixaban  resumed  Code Status: Full  Family Communication: patient at bedside Disposition: SNF when insurance authorizes anticipating SNF on 5/12    Consultants:   Procedures:   Antimicrobials:   Ceftriaxone  5/6>>  Subjective: Denies any specific complaints.    Objective: Vitals:   09/26/23 1511 09/26/23 1956 09/27/23 0532 09/27/23 1400  BP: 127/65 134/74 133/87 (!) 137/51  Pulse: 84 97 100 78  Resp:  (!) 21 (!) 21   Temp: 97.6 F (36.4 C) 97.7 F (36.5 C) 98.1 F (36.7 C) 97.8 F (36.6 C)  TempSrc: Oral Oral Oral Oral  SpO2: 95% 92% 95% 99%  Weight:      Height:  Intake/Output Summary (Last 24 hours) at 09/27/2023 1617 Last data filed at 09/27/2023 1300 Gross per 24 hour  Intake 480 ml  Output 1800 ml  Net -1320 ml   Filed Weights   09/22/23 2326 09/23/23 0807  Weight: 112 kg 107.1 kg   Examination:  General exam: Appears calm and frail, weak, sitting up in chair, NAD.  Respiratory system: Clear to auscultation. Respiratory effort normal. Cardiovascular system: normal S1 & S2 heard. No JVD, murmurs,  rubs, gallops or clicks. No pedal edema. Gastrointestinal system: Abdomen is nondistended, soft and nontender. No organomegaly or masses felt. Normal bowel sounds heard. Central nervous system: Alert and oriented. No focal neurological deficits. Extremities: Symmetric 5 x 5 power. Skin: No rashes, lesions or ulcers. Psychiatry: Judgement and insight appear normal. Mood & affect appropriate.   Data Reviewed: I have personally reviewed following labs and imaging studies  CBC: Recent Labs  Lab 09/23/23 0042 09/24/23 0502  WBC 5.4 5.0  NEUTROABS 4.4  --   HGB 13.0 12.2*  HCT 39.8 37.9*  MCV 91.9 89.6  PLT 123* 124*    Basic Metabolic Panel: Recent Labs  Lab 09/23/23 0042 09/24/23 0502  NA 137 132*  K 4.3 3.9  CL 105 103  CO2 23 22  GLUCOSE 120* 96  BUN 13 10  CREATININE 0.80 0.78  CALCIUM  8.5* 8.4*  MG  --  1.8    CBG: Recent Labs  Lab 09/26/23 1123 09/26/23 1641 09/26/23 1954 09/27/23 0805 09/27/23 1111  GLUCAP 153* 115* 136* 120* 114*    Recent Results (from the past 240 hours)  Blood culture (routine x 2)     Status: Abnormal   Collection Time: 09/23/23 12:42 AM   Specimen: BLOOD  Result Value Ref Range Status   Specimen Description   Final    BLOOD BLOOD RIGHT ARM Performed at [facility], [address] [place], [place] [address]    Special Requests   Final    BOTTLES DRAWN AEROBIC AND ANAEROBIC Blood Culture adequate volume Performed at [facility], [address], [place], [place] [address]    Culture  Setup Time   Final    IN BOTH AEROBIC AND ANAEROBIC BOTTLES GRAM NEGATIVE RODS Gram Stain Report Called to,Read Back By and Verified With: J [facility] AT 1306 09/23/23 BY A WILSON Performed at [facility], [address], [place], [place] [address]    Culture (A)  Final    ESCHERICHIA COLI SUSCEPTIBILITIES PERFORMED ON PREVIOUS CULTURE WITHIN THE LAST 5 DAYS. Performed at [facility], [address] [place], [place] [address]    Report  Status 09/25/2023 FINAL  Final  Resp panel by RT-PCR (RSV, Flu A&B, Covid) Anterior Nasal Swab     Status: None   Collection Time: 09/23/23 12:51 AM   Specimen: Anterior Nasal Swab  Result Value Ref Range Status   SARS Coronavirus 2 by RT PCR NEGATIVE NEGATIVE Final    Comment: (NOTE) SARS-CoV-2 target nucleic acids are NOT DETECTED.  The SARS-CoV-2 RNA is generally detectable in upper respiratory specimens during the acute phase of infection. The lowest concentration of SARS-CoV-2 viral copies this assay can detect is 138 copies/mL. A negative result does not preclude SARS-Cov-2 infection and should not be used as the sole basis for treatment or other patient management decisions. A negative result may occur with  improper specimen collection/handling, submission of specimen other than nasopharyngeal swab, presence of viral mutation(s) within the areas targeted by this assay, and inadequate  number of viral copies(<138 copies/mL). A negative result must be combined with clinical observations, patient history, and epidemiological information. The expected result is Negative.  Fact Sheet for Patients:  BloggerCourse.com  Fact Sheet for Healthcare Providers:  SeriousBroker.it  This test is no t yet approved or cleared by the [place]  FDA and  has been authorized for detection and/or diagnosis of SARS-CoV-2 by FDA under an Emergency Use Authorization (EUA). This EUA will remain  in effect (meaning this test can be used) for the duration of the COVID-19 declaration under Section 564(b)(1) of the Act, 21 U.S.C.section 360bbb-3(b)(1), unless the authorization is terminated  or revoked sooner.       Influenza A by PCR NEGATIVE NEGATIVE Final   Influenza B by PCR NEGATIVE NEGATIVE Final    Comment: (NOTE) The Xpert Xpress SARS-CoV-2/FLU/RSV plus assay is intended as an aid in the diagnosis of influenza from Nasopharyngeal swab  specimens and should not be used as a sole basis for treatment. Nasal washings and aspirates are unacceptable for Xpert Xpress SARS-CoV-2/FLU/RSV testing.  Fact Sheet for Patients: BloggerCourse.com  Fact Sheet for Healthcare Providers: SeriousBroker.it  This test is not yet approved or cleared by the [place]  FDA and has been authorized for detection and/or diagnosis of SARS-CoV-2 by FDA under an Emergency Use Authorization (EUA). This EUA will remain in effect (meaning this test can be used) for the duration of the COVID-19 declaration under Section 564(b)(1) of the Act, 21 U.S.C. section 360bbb-3(b)(1), unless the authorization is terminated or revoked.     Resp Syncytial Virus by PCR NEGATIVE NEGATIVE Final    Comment: (NOTE) Fact Sheet for Patients: BloggerCourse.com  Fact Sheet for Healthcare Providers: SeriousBroker.it  This test is not yet approved or cleared by the [place]  FDA and has been authorized for detection and/or diagnosis of SARS-CoV-2 by FDA under an Emergency Use Authorization (EUA). This EUA will remain in effect (meaning this test can be used) for the duration of the COVID-19 declaration under Section 564(b)(1) of the Act, 21 U.S.C. section 360bbb-3(b)(1), unless the authorization is terminated or revoked.  Performed at [facility], [address] [place], [place] [address]   Blood culture (routine x 2)     Status: Abnormal   Collection Time: 09/23/23  1:30 AM   Specimen: BLOOD  Result Value Ref Range Status   Specimen Description   Final    BLOOD RIGHT ANTECUBITAL Performed at [facility], [address], [place], [place] [address]    Special Requests   Final    BOTTLES DRAWN AEROBIC AND ANAEROBIC Blood Culture adequate volume Performed at [facility], [address], [place], [place] [address]    Culture  Setup Time   Final    IN BOTH  AEROBIC AND ANAEROBIC BOTTLES GRAM NEGATIVE RODS Gram Stain Report Called to,Read Back By and Verified With: A JACKSON AT 1352 09/23/23 BY A WILSON CRITICAL RESULT CALLED TO, READ BACK BY AND VERIFIED WITH: RN JADA MUSE ON 09/23/23 @ 1935 BY DRT Performed at [facility], [address] [place], [place] [address]    Culture ESCHERICHIA COLI (A)  Final   Report Status 09/25/2023 FINAL  Final   Organism ID, Bacteria ESCHERICHIA COLI  Final   Organism ID, Bacteria ESCHERICHIA COLI  Final      Susceptibility   Escherichia coli - KIRBY BAUER*    CEFAZOLIN  RESISTANT Resistant    Escherichia coli - MIC*    AMPICILLIN >=32 RESISTANT Resistant  CEFEPIME  <=0.12 SENSITIVE Sensitive     CEFTAZIDIME 4 SENSITIVE Sensitive     CEFTRIAXONE  0.5 SENSITIVE Sensitive     CIPROFLOXACIN  <=0.25 SENSITIVE Sensitive     GENTAMICIN <=1 SENSITIVE Sensitive     IMIPENEM <=0.25 SENSITIVE Sensitive     TRIMETH/SULFA >=320 RESISTANT Resistant     AMPICILLIN/SULBACTAM >=32 RESISTANT Resistant     PIP/TAZO <=4 SENSITIVE Sensitive ug/mL    * ESCHERICHIA COLI    ESCHERICHIA COLI  Blood Culture ID Panel (Reflexed)     Status: Abnormal   Collection Time: 09/23/23  1:30 AM  Result Value Ref Range Status   Enterococcus faecalis NOT DETECTED NOT DETECTED Final   Enterococcus Faecium NOT DETECTED NOT DETECTED Final   Listeria monocytogenes NOT DETECTED NOT DETECTED Final   Staphylococcus species NOT DETECTED NOT DETECTED Final   Staphylococcus aureus (BCID) NOT DETECTED NOT DETECTED Final   Staphylococcus epidermidis NOT DETECTED NOT DETECTED Final   Staphylococcus lugdunensis NOT DETECTED NOT DETECTED Final   Streptococcus species NOT DETECTED NOT DETECTED Final   Streptococcus agalactiae NOT DETECTED NOT DETECTED Final   Streptococcus pneumoniae NOT DETECTED NOT DETECTED Final   Streptococcus pyogenes NOT DETECTED NOT DETECTED Final   A.calcoaceticus-baumannii NOT DETECTED NOT DETECTED Final   Bacteroides  fragilis NOT DETECTED NOT DETECTED Final   Enterobacterales DETECTED (A) NOT DETECTED Final    Comment: Enterobacterales represent a large order of gram negative bacteria, not a single organism. CRITICAL RESULT CALLED TO, READ BACK BY AND VERIFIED WITH: RN JADA MUSE ON 09/23/23 @ 1935 BY DRT    Enterobacter cloacae complex NOT DETECTED NOT DETECTED Final   Escherichia coli DETECTED (A) NOT DETECTED Final    Comment: CRITICAL RESULT CALLED TO, READ BACK BY AND VERIFIED WITH: RN JADA MUSE ON 09/23/23 @ 1935 BY DRT    Klebsiella aerogenes NOT DETECTED NOT DETECTED Final   Klebsiella oxytoca NOT DETECTED NOT DETECTED Final   Klebsiella pneumoniae NOT DETECTED NOT DETECTED Final   Proteus species NOT DETECTED NOT DETECTED Final   Salmonella species NOT DETECTED NOT DETECTED Final   Serratia marcescens NOT DETECTED NOT DETECTED Final   Haemophilus influenzae NOT DETECTED NOT DETECTED Final   Neisseria meningitidis NOT DETECTED NOT DETECTED Final   Pseudomonas aeruginosa NOT DETECTED NOT DETECTED Final   Stenotrophomonas maltophilia NOT DETECTED NOT DETECTED Final   Candida albicans NOT DETECTED NOT DETECTED Final   Candida auris NOT DETECTED NOT DETECTED Final   Candida glabrata NOT DETECTED NOT DETECTED Final   Candida krusei NOT DETECTED NOT DETECTED Final   Candida parapsilosis NOT DETECTED NOT DETECTED Final   Candida tropicalis NOT DETECTED NOT DETECTED Final   Cryptococcus neoformans/gattii NOT DETECTED NOT DETECTED Final   CTX-M ESBL NOT DETECTED NOT DETECTED Final   Carbapenem resistance IMP NOT DETECTED NOT DETECTED Final   Carbapenem resistance KPC NOT DETECTED NOT DETECTED Final   Carbapenem resistance NDM NOT DETECTED NOT DETECTED Final   Carbapenem resist OXA 48 LIKE NOT DETECTED NOT DETECTED Final   Carbapenem resistance VIM NOT DETECTED NOT DETECTED Final    Comment: Performed at [facility], [address], [place], [place] [address]  Urine Culture     Status: Abnormal    Collection Time: 09/23/23  4:14 AM   Specimen: Urine, Random  Result Value Ref Range Status   Specimen Description   Final    URINE, RANDOM Performed at [facility], [address] [place], [place] [address]    Special Requests  Final    NONE Reflexed from [id number] Performed at [facility], [address], [place], [place] [address]    Culture (A)  Final    >=100,000 COLONIES/mL ESCHERICHIA COLI 80,000 COLONIES/mL ENTEROCOCCUS FAECALIS    Report Status 09/25/2023 FINAL  Final   Organism ID, Bacteria ESCHERICHIA COLI (A)  Final   Organism ID, Bacteria ENTEROCOCCUS FAECALIS (A)  Final      Susceptibility   Escherichia coli - MIC*    AMPICILLIN 4 SENSITIVE Sensitive     CEFAZOLIN  <=4 SENSITIVE Sensitive     CEFEPIME  <=0.12 SENSITIVE Sensitive     CEFTRIAXONE  <=0.25 SENSITIVE Sensitive     CIPROFLOXACIN  <=0.25 SENSITIVE Sensitive     GENTAMICIN <=1 SENSITIVE Sensitive     IMIPENEM 0.5 SENSITIVE Sensitive     NITROFURANTOIN  <=16 SENSITIVE Sensitive     TRIMETH/SULFA >=320 RESISTANT Resistant     AMPICILLIN/SULBACTAM <=2 SENSITIVE Sensitive     PIP/TAZO <=4 SENSITIVE Sensitive ug/mL    * >=100,000 COLONIES/mL ESCHERICHIA COLI   Enterococcus faecalis - MIC*    AMPICILLIN <=2 SENSITIVE Sensitive     NITROFURANTOIN  <=16 SENSITIVE Sensitive     VANCOMYCIN  1 SENSITIVE Sensitive     * 80,000 COLONIES/mL ENTEROCOCCUS FAECALIS     Radiology Studies: No results found.   Scheduled Meds:  apixaban   5 mg Oral BID   diltiazem   180 mg Oral Daily   dutasteride   0.5 mg Oral Daily   losartan   50 mg Oral Daily   melatonin  10.5 mg Oral QHS   metoprolol  tartrate  25 mg Oral QHS   metoprolol  tartrate  50 mg Oral Daily   oxybutynin   5 mg Oral QPM   Continuous Infusions:  cefTRIAXone  (ROCEPHIN )  IV 2 g (09/27/23 0945)    LOS: 4 days   Time spent: 40 mins  Mazi Brailsford Lincoln Renshaw, MD How to contact the [facility] Attending or Consulting provider 7A - 7P or covering provider during after hours 7P  -7A, for this patient?  Check the care team in [facility] and look for a) attending/consulting [facility] provider listed and b) the [facility] team listed Log into [email or web address] to find provider on call.  Locate the [facility] provider you are looking for under [facility] and page to a number that you can be directly reached. If you still have difficulty reaching the provider, please page the [facility] (Director on Call) for the Hospitalists listed on amion for assistance.  09/27/2023, 4:17 PM

## 2023-09-28 DIAGNOSIS — J449 Chronic obstructive pulmonary disease, unspecified: Secondary | ICD-10-CM | POA: Diagnosis not present

## 2023-09-28 DIAGNOSIS — E782 Mixed hyperlipidemia: Secondary | ICD-10-CM | POA: Diagnosis not present

## 2023-09-28 DIAGNOSIS — A419 Sepsis, unspecified organism: Secondary | ICD-10-CM | POA: Diagnosis not present

## 2023-09-28 DIAGNOSIS — I5032 Chronic diastolic (congestive) heart failure: Secondary | ICD-10-CM | POA: Diagnosis not present

## 2023-09-28 LAB — GLUCOSE, CAPILLARY
Glucose-Capillary: 113 mg/dL — ABNORMAL HIGH (ref 70–99)
Glucose-Capillary: 119 mg/dL — ABNORMAL HIGH (ref 70–99)
Glucose-Capillary: 127 mg/dL — ABNORMAL HIGH (ref 70–99)
Glucose-Capillary: 152 mg/dL — ABNORMAL HIGH (ref 70–99)

## 2023-09-28 MED ORDER — ZINC OXIDE 40 % EX OINT
TOPICAL_OINTMENT | Freq: Two times a day (BID) | CUTANEOUS | Status: DC
Start: 2023-09-28 — End: 2023-09-30
  Filled 2023-09-28: qty 57

## 2023-09-28 NOTE — Plan of Care (Signed)
  Problem: Health Behavior/Discharge Planning: Goal: Ability to manage health-related needs will improve Outcome: Progressing   Problem: Clinical Measurements: Goal: Ability to maintain clinical measurements within normal limits will improve Outcome: Progressing Goal: Will remain free from infection Outcome: Progressing Goal: Diagnostic test results will improve Outcome: Progressing   Problem: Activity: Goal: Risk for activity intolerance will decrease Outcome: Progressing   Problem: Elimination: Goal: Will not experience complications related to urinary retention Outcome: Progressing   Problem: Pain Managment: Goal: General experience of comfort will improve and/or be controlled Outcome: Progressing   Problem: Safety: Goal: Ability to remain free from injury will improve Outcome: Progressing   Problem: Skin Integrity: Goal: Risk for impaired skin integrity will decrease Outcome: Progressing   Problem: Coping: Goal: Ability to adjust to condition or change in health will improve Outcome: Progressing   Problem: Fluid Volume: Goal: Ability to maintain a balanced intake and output will improve Outcome: Progressing   Problem: Health Behavior/Discharge Planning: Goal: Ability to manage health-related needs will improve Outcome: Progressing   Problem: Skin Integrity: Goal: Risk for impaired skin integrity will decrease Outcome: Progressing

## 2023-09-28 NOTE — Progress Notes (Signed)
 PROGRESS NOTE   Gary Carroll  UXL:[id number] DOB: 02/18/1946 DOA: 09/22/2023 PCP: [facility]   Chief Complaint  Patient presents with   Failure To Thrive   Level of care: Med-Surg  Brief Admission History:  78 y.o. male with medical history significant for severe AS status post TAVR, PAF on Eliquis , CAD, rectal adenocarcinoma, hypertension, dyslipidemia, type 2 diabetes, and chronic HFpEF who presented to the ED with weakness and inability to ambulate.  He was recently discharged to SNF on 4/8 with noted significant weakness at that time related to COVID-19 infection.  He was brought to ED via EMS who reported that his living conditions were quite bad and patient has been unable to ambulate well for a long period of time.  The call was made to EMS after he fell and he reported that he felt quite cold and could not get warm.   ED Course: Initial temperature 101.4 Fahrenheit with pulse rate of 101 and blood pressure stable.  Platelet count of 123,000 and other labs within normal limits.  Urine analysis suggesting UTI and patient was given 1 L fluid bolus as well as Rocephin .  Blood and urine cultures have been ordered and pending.   Assessment and Plan:  E coli Sepsis, POA, secondary to E coli and Enterobacter UTI E coli Bacteremia  - Monitor ongoing cultures with no growth noted on blood cultures thus far - Continue Rocephin  2 gram IV daily based on C&S data, with plan to stop at 7 days if still in hospital - With generalized weakness associated, will reassess with PT/OT and place on fall precautions, SNF has been recommended by PT, TOC consulted to assist with placement - awaiting insurance auth   PAF/flutter -Currently stable on telemetry -Continue home diltiazem  and metoprolol  -apixaban  for anticoagulation   Mild transaminitis -Likely related to sepsis physiology, continue to trend and monitor -Holding statin for now   OSA -On CPAP   Hypertension -Continue  home medications   Dyslipidemia - Held statin given mild transaminitis, resume at discharge as he is on long acting rosuvastatin    Chronic HFpEF -Currently appears euvolemic and given IV fluid bolus in ED, avoid further IV fluid for now   Type 2 diabetes -Currently without hyperglycemia -Continue metformin  -Carb diet -SSI  CBG (last 3)  Recent Labs    09/27/23 1637 09/27/23 2130 09/28/23 0736  GLUCAP 132* 129* 113*   Rectal adenocarcinoma -Follows with oncologist at [facility] and currently not on any further chemotherapy or radiation -Apparently not a candidate for surgery -Overall prognosis appears poor -Appreciate palliative consultation for goals of care discussion especially in light of readmission and what appears to be a failure to thrive picture   History of severe AS status post TAVR -Successful TAVR with 26 mm Edwards SAPIEN 3 THV on 02/25/2023   Obesity, class I -BMI 34.44   DVT prophylaxis: apixaban  resumed  Code Status: Full  Family Communication: patient at bedside Disposition: SNF when insurance authorizes anticipating SNF on 5/12    Consultants:   Procedures:   Antimicrobials:   Ceftriaxone  5/6>>  Subjective: No specific complaints.   Objective: Vitals:   09/27/23 2104 09/27/23 2149 09/27/23 2356 09/28/23 0700  BP: 131/78 131/78 124/70   Pulse: 98 98 66   Resp: 16  18 18   Temp: 98.4 F (36.9 C)  98 F (36.7 C)   TempSrc: Oral     SpO2: 96%  95%   Weight:      Height:  Intake/Output Summary (Last 24 hours) at 09/28/2023 1021 Last data filed at 09/28/2023 0208 Gross per 24 hour  Intake 480 ml  Output 1700 ml  Net -1220 ml   Filed Weights   09/22/23 2326 09/23/23 0807  Weight: 112 kg 107.1 kg   Examination:  General exam: Appears calm and frail, weak, NAD. He is oriented x 3 and cooperative. Respiratory system: no increased work of breathing. Cardiovascular system: normal S1 & S2 heard. No JVD, murmurs, rubs, gallops or clicks. No  pedal edema. Gastrointestinal system: Abdomen is nondistended, soft and nontender. No organomegaly or masses felt. Normal bowel sounds heard. Central nervous system: Alert and oriented. No focal neurological deficits. Extremities: Symmetric 5 x 5 power. Skin: No rashes, lesions or ulcers. Psychiatry: Judgement and insight appear normal. Mood & affect appropriate.   Data Reviewed: I have personally reviewed following labs and imaging studies  CBC: Recent Labs  Lab 09/23/23 0042 09/24/23 0502  WBC 5.4 5.0  NEUTROABS 4.4  --   HGB 13.0 12.2*  HCT 39.8 37.9*  MCV 91.9 89.6  PLT 123* 124*    Basic Metabolic Panel: Recent Labs  Lab 09/23/23 0042 09/24/23 0502  NA 137 132*  K 4.3 3.9  CL 105 103  CO2 23 22  GLUCOSE 120* 96  BUN 13 10  CREATININE 0.80 0.78  CALCIUM  8.5* 8.4*  MG  --  1.8    CBG: Recent Labs  Lab 09/27/23 0805 09/27/23 1111 09/27/23 1637 09/27/23 2130 09/28/23 0736  GLUCAP 120* 114* 132* 129* 113*    Recent Results (from the past 240 hours)  Blood culture (routine x 2)     Status: Abnormal   Collection Time: 09/23/23 12:42 AM   Specimen: BLOOD  Result Value Ref Range Status   Specimen Description   Final    BLOOD BLOOD RIGHT ARM Performed at [facility], [address], [place], [place] [address]    Special Requests   Final    BOTTLES DRAWN AEROBIC AND ANAEROBIC Blood Culture adequate volume Performed at [facility], [address], [place], [place] [address]    Culture  Setup Time   Final    IN BOTH AEROBIC AND ANAEROBIC BOTTLES GRAM NEGATIVE RODS Gram Stain Report Called to,Read Back By and Verified With: J [facility] AT 1306 09/23/23 BY A WILSON Performed at [facility], [address] [place], [place] [address]    Culture (A)  Final    ESCHERICHIA COLI SUSCEPTIBILITIES PERFORMED ON PREVIOUS CULTURE WITHIN THE LAST 5 DAYS. Performed at [facility], [address] [place], [place] [address]    Report Status 09/25/2023 FINAL  Final   Resp panel by RT-PCR (RSV, Flu A&B, Covid) Anterior Nasal Swab     Status: None   Collection Time: 09/23/23 12:51 AM   Specimen: Anterior Nasal Swab  Result Value Ref Range Status   SARS Coronavirus 2 by RT PCR NEGATIVE NEGATIVE Final    Comment: (NOTE) SARS-CoV-2 target nucleic acids are NOT DETECTED.  The SARS-CoV-2 RNA is generally detectable in upper respiratory specimens during the acute phase of infection. The lowest concentration of SARS-CoV-2 viral copies this assay can detect is 138 copies/mL. A negative result does not preclude SARS-Cov-2 infection and should not be used as the sole basis for treatment or other patient management decisions. A negative result may occur with  improper specimen collection/handling, submission of specimen other than nasopharyngeal swab, presence of viral mutation(s) within the areas targeted by this assay, and  inadequate number of viral copies(<138 copies/mL). A negative result must be combined with clinical observations, patient history, and epidemiological information. The expected result is Negative.  Fact Sheet for Patients:  BloggerCourse.com  Fact Sheet for Healthcare Providers:  SeriousBroker.it  This test is no t yet approved or cleared by the [place]  FDA and  has been authorized for detection and/or diagnosis of SARS-CoV-2 by FDA under an Emergency Use Authorization (EUA). This EUA will remain  in effect (meaning this test can be used) for the duration of the COVID-19 declaration under Section 564(b)(1) of the Act, 21 U.S.C.section 360bbb-3(b)(1), unless the authorization is terminated  or revoked sooner.       Influenza A by PCR NEGATIVE NEGATIVE Final   Influenza B by PCR NEGATIVE NEGATIVE Final    Comment: (NOTE) The Xpert Xpress SARS-CoV-2/FLU/RSV plus assay is intended as an aid in the diagnosis of influenza from Nasopharyngeal swab specimens and should not be used  as a sole basis for treatment. Nasal washings and aspirates are unacceptable for Xpert Xpress SARS-CoV-2/FLU/RSV testing.  Fact Sheet for Patients: BloggerCourse.com  Fact Sheet for Healthcare Providers: SeriousBroker.it  This test is not yet approved or cleared by the [place]  FDA and has been authorized for detection and/or diagnosis of SARS-CoV-2 by FDA under an Emergency Use Authorization (EUA). This EUA will remain in effect (meaning this test can be used) for the duration of the COVID-19 declaration under Section 564(b)(1) of the Act, 21 U.S.C. section 360bbb-3(b)(1), unless the authorization is terminated or revoked.     Resp Syncytial Virus by PCR NEGATIVE NEGATIVE Final    Comment: (NOTE) Fact Sheet for Patients: BloggerCourse.com  Fact Sheet for Healthcare Providers: SeriousBroker.it  This test is not yet approved or cleared by the [place]  FDA and has been authorized for detection and/or diagnosis of SARS-CoV-2 by FDA under an Emergency Use Authorization (EUA). This EUA will remain in effect (meaning this test can be used) for the duration of the COVID-19 declaration under Section 564(b)(1) of the Act, 21 U.S.C. section 360bbb-3(b)(1), unless the authorization is terminated or revoked.  Performed at [facility], [address] [place], [place] [address]   Blood culture (routine x 2)     Status: Abnormal   Collection Time: 09/23/23  1:30 AM   Specimen: BLOOD  Result Value Ref Range Status   Specimen Description   Final    BLOOD RIGHT ANTECUBITAL Performed at [facility], [address] [place], [place] [address]    Special Requests   Final    BOTTLES DRAWN AEROBIC AND ANAEROBIC Blood Culture adequate volume Performed at [facility], [address], [place], [place] [address]    Culture  Setup Time   Final    IN BOTH AEROBIC AND ANAEROBIC  BOTTLES GRAM NEGATIVE RODS Gram Stain Report Called to,Read Back By and Verified With: A JACKSON AT 1352 09/23/23 BY A WILSON CRITICAL RESULT CALLED TO, READ BACK BY AND VERIFIED WITH: RN JADA MUSE ON 09/23/23 @ 1935 BY DRT Performed at [facility], [address], [place], [place] [address]    Culture ESCHERICHIA COLI (A)  Final   Report Status 09/25/2023 FINAL  Final   Organism ID, Bacteria ESCHERICHIA COLI  Final   Organism ID, Bacteria ESCHERICHIA COLI  Final      Susceptibility   Escherichia coli - KIRBY BAUER*    CEFAZOLIN  RESISTANT Resistant    Escherichia coli - MIC*    AMPICILLIN >=32 RESISTANT Resistant  CEFEPIME  <=0.12 SENSITIVE Sensitive     CEFTAZIDIME 4 SENSITIVE Sensitive     CEFTRIAXONE  0.5 SENSITIVE Sensitive     CIPROFLOXACIN  <=0.25 SENSITIVE Sensitive     GENTAMICIN <=1 SENSITIVE Sensitive     IMIPENEM <=0.25 SENSITIVE Sensitive     TRIMETH/SULFA >=320 RESISTANT Resistant     AMPICILLIN/SULBACTAM >=32 RESISTANT Resistant     PIP/TAZO <=4 SENSITIVE Sensitive ug/mL    * ESCHERICHIA COLI    ESCHERICHIA COLI  Blood Culture ID Panel (Reflexed)     Status: Abnormal   Collection Time: 09/23/23  1:30 AM  Result Value Ref Range Status   Enterococcus faecalis NOT DETECTED NOT DETECTED Final   Enterococcus Faecium NOT DETECTED NOT DETECTED Final   Listeria monocytogenes NOT DETECTED NOT DETECTED Final   Staphylococcus species NOT DETECTED NOT DETECTED Final   Staphylococcus aureus (BCID) NOT DETECTED NOT DETECTED Final   Staphylococcus epidermidis NOT DETECTED NOT DETECTED Final   Staphylococcus lugdunensis NOT DETECTED NOT DETECTED Final   Streptococcus species NOT DETECTED NOT DETECTED Final   Streptococcus agalactiae NOT DETECTED NOT DETECTED Final   Streptococcus pneumoniae NOT DETECTED NOT DETECTED Final   Streptococcus pyogenes NOT DETECTED NOT DETECTED Final   A.calcoaceticus-baumannii NOT DETECTED NOT DETECTED Final   Bacteroides fragilis NOT DETECTED  NOT DETECTED Final   Enterobacterales DETECTED (A) NOT DETECTED Final    Comment: Enterobacterales represent a large order of gram negative bacteria, not a single organism. CRITICAL RESULT CALLED TO, READ BACK BY AND VERIFIED WITH: RN JADA MUSE ON 09/23/23 @ 1935 BY DRT    Enterobacter cloacae complex NOT DETECTED NOT DETECTED Final   Escherichia coli DETECTED (A) NOT DETECTED Final    Comment: CRITICAL RESULT CALLED TO, READ BACK BY AND VERIFIED WITH: RN JADA MUSE ON 09/23/23 @ 1935 BY DRT    Klebsiella aerogenes NOT DETECTED NOT DETECTED Final   Klebsiella oxytoca NOT DETECTED NOT DETECTED Final   Klebsiella pneumoniae NOT DETECTED NOT DETECTED Final   Proteus species NOT DETECTED NOT DETECTED Final   Salmonella species NOT DETECTED NOT DETECTED Final   Serratia marcescens NOT DETECTED NOT DETECTED Final   Haemophilus influenzae NOT DETECTED NOT DETECTED Final   Neisseria meningitidis NOT DETECTED NOT DETECTED Final   Pseudomonas aeruginosa NOT DETECTED NOT DETECTED Final   Stenotrophomonas maltophilia NOT DETECTED NOT DETECTED Final   Candida albicans NOT DETECTED NOT DETECTED Final   Candida auris NOT DETECTED NOT DETECTED Final   Candida glabrata NOT DETECTED NOT DETECTED Final   Candida krusei NOT DETECTED NOT DETECTED Final   Candida parapsilosis NOT DETECTED NOT DETECTED Final   Candida tropicalis NOT DETECTED NOT DETECTED Final   Cryptococcus neoformans/gattii NOT DETECTED NOT DETECTED Final   CTX-M ESBL NOT DETECTED NOT DETECTED Final   Carbapenem resistance IMP NOT DETECTED NOT DETECTED Final   Carbapenem resistance KPC NOT DETECTED NOT DETECTED Final   Carbapenem resistance NDM NOT DETECTED NOT DETECTED Final   Carbapenem resist OXA 48 LIKE NOT DETECTED NOT DETECTED Final   Carbapenem resistance VIM NOT DETECTED NOT DETECTED Final    Comment: Performed at [facility], [address] [place], [place] [address]  Urine Culture     Status: Abnormal   Collection Time:  09/23/23  4:14 AM   Specimen: Urine, Random  Result Value Ref Range Status   Specimen Description   Final    URINE, RANDOM Performed at [facility], [address] [place], [place] [address]    Special Requests  Final    NONE Reflexed from [id number] Performed at [facility], [address] [place], [place] [address]    Culture (A)  Final    >=100,000 COLONIES/mL ESCHERICHIA COLI 80,000 COLONIES/mL ENTEROCOCCUS FAECALIS    Report Status 09/25/2023 FINAL  Final   Organism ID, Bacteria ESCHERICHIA COLI (A)  Final   Organism ID, Bacteria ENTEROCOCCUS FAECALIS (A)  Final      Susceptibility   Escherichia coli - MIC*    AMPICILLIN 4 SENSITIVE Sensitive     CEFAZOLIN  <=4 SENSITIVE Sensitive     CEFEPIME  <=0.12 SENSITIVE Sensitive     CEFTRIAXONE  <=0.25 SENSITIVE Sensitive     CIPROFLOXACIN  <=0.25 SENSITIVE Sensitive     GENTAMICIN <=1 SENSITIVE Sensitive     IMIPENEM 0.5 SENSITIVE Sensitive     NITROFURANTOIN  <=16 SENSITIVE Sensitive     TRIMETH/SULFA >=320 RESISTANT Resistant     AMPICILLIN/SULBACTAM <=2 SENSITIVE Sensitive     PIP/TAZO <=4 SENSITIVE Sensitive ug/mL    * >=100,000 COLONIES/mL ESCHERICHIA COLI   Enterococcus faecalis - MIC*    AMPICILLIN <=2 SENSITIVE Sensitive     NITROFURANTOIN  <=16 SENSITIVE Sensitive     VANCOMYCIN  1 SENSITIVE Sensitive     * 80,000 COLONIES/mL ENTEROCOCCUS FAECALIS     Radiology Studies: No results found.   Scheduled Meds:  apixaban   5 mg Oral BID   diltiazem   180 mg Oral Daily   dutasteride   0.5 mg Oral Daily   losartan   50 mg Oral Daily   melatonin  10.5 mg Oral QHS   metoprolol  tartrate  25 mg Oral QHS   metoprolol  tartrate  50 mg Oral Daily   oxybutynin   5 mg Oral QPM   Continuous Infusions:  cefTRIAXone  (ROCEPHIN )  IV 2 g (09/28/23 0842)    LOS: 5 days   Time spent: 42 mins  Ritamarie Arkin Lincoln Renshaw, MD How to contact the [facility] Attending or Consulting provider 7A - 7P or covering provider during after hours 7P -7A, for this  patient?  Check the care team in [facility] and look for a) attending/consulting [facility] provider listed and b) the [facility] team listed Log into [email or web address] to find provider on call.  Locate the [facility] provider you are looking for under [facility] and page to a number that you can be directly reached. If you still have difficulty reaching the provider, please page the [facility] (Director on Call) for the Hospitalists listed on amion for assistance.  09/28/2023, 10:21 AM

## 2023-09-28 NOTE — Plan of Care (Signed)
  Problem: Health Behavior/Discharge Planning: Goal: Ability to manage health-related needs will improve Outcome: Progressing   Problem: Clinical Measurements: Goal: Ability to maintain clinical measurements within normal limits will improve Outcome: Progressing Goal: Will remain free from infection Outcome: Progressing Goal: Diagnostic test results will improve Outcome: Progressing   Problem: Activity: Goal: Risk for activity intolerance will decrease Outcome: Progressing   Problem: Elimination: Goal: Will not experience complications related to bowel motility Outcome: Progressing Goal: Will not experience complications related to urinary retention Outcome: Progressing   Problem: Pain Managment: Goal: General experience of comfort will improve and/or be controlled Outcome: Progressing   Problem: Safety: Goal: Ability to remain free from injury will improve Outcome: Progressing   Problem: Skin Integrity: Goal: Risk for impaired skin integrity will decrease Outcome: Progressing   Problem: Coping: Goal: Ability to adjust to condition or change in health will improve Outcome: Progressing   Problem: Fluid Volume: Goal: Ability to maintain a balanced intake and output will improve Outcome: Progressing   Problem: Health Behavior/Discharge Planning: Goal: Ability to manage health-related needs will improve Outcome: Progressing   Problem: Skin Integrity: Goal: Risk for impaired skin integrity will decrease Outcome: Progressing   Problem: Tissue Perfusion: Goal: Adequacy of tissue perfusion will improve Outcome: Progressing

## 2023-09-29 DIAGNOSIS — I5032 Chronic diastolic (congestive) heart failure: Secondary | ICD-10-CM | POA: Diagnosis not present

## 2023-09-29 DIAGNOSIS — J449 Chronic obstructive pulmonary disease, unspecified: Secondary | ICD-10-CM | POA: Diagnosis not present

## 2023-09-29 DIAGNOSIS — N390 Urinary tract infection, site not specified: Secondary | ICD-10-CM | POA: Diagnosis not present

## 2023-09-29 DIAGNOSIS — A419 Sepsis, unspecified organism: Secondary | ICD-10-CM | POA: Diagnosis not present

## 2023-09-29 LAB — GLUCOSE, CAPILLARY
Glucose-Capillary: 133 mg/dL — ABNORMAL HIGH (ref 70–99)
Glucose-Capillary: 126 mg/dL — ABNORMAL HIGH (ref 70–99)

## 2023-09-29 NOTE — Plan of Care (Signed)
  Problem: Health Behavior/Discharge Planning: Goal: Ability to manage health-related needs will improve Outcome: Progressing   Problem: Clinical Measurements: Goal: Ability to maintain clinical measurements within normal limits will improve Outcome: Progressing Goal: Will remain free from infection Outcome: Progressing Goal: Diagnostic test results will improve Outcome: Progressing   Problem: Activity: Goal: Risk for activity intolerance will decrease Outcome: Progressing   Problem: Elimination: Goal: Will not experience complications related to bowel motility Outcome: Progressing Goal: Will not experience complications related to urinary retention Outcome: Progressing   Problem: Safety: Goal: Ability to remain free from injury will improve Outcome: Progressing   Problem: Coping: Goal: Ability to adjust to condition or change in health will improve Outcome: Progressing   Problem: Health Behavior/Discharge Planning: Goal: Ability to manage health-related needs will improve Outcome: Progressing   Problem: Tissue Perfusion: Goal: Adequacy of tissue perfusion will improve Outcome: Progressing

## 2023-09-29 NOTE — Progress Notes (Signed)
 Nurse at bedside,patient alert and oriented times four.Blood pressure 129/80,heart rate 103,Dr Johnson notified.No c/o pain or discomfort noted.Plan of care on going.

## 2023-09-29 NOTE — Plan of Care (Signed)
  Problem: Safety: Goal: Ability to remain free from injury will improve Outcome: Progressing   Problem: Safety: Goal: Ability to remain free from injury will improve Outcome: Progressing

## 2023-09-29 NOTE — Progress Notes (Signed)
 PROGRESS NOTE   Gary Carroll  VHQ:[id number] DOB: 08-31-1945 DOA: 09/22/2023 PCP: [facility]   Chief Complaint  Patient presents with   Failure To Thrive   Level of care: Med-Surg  Brief Admission History:  78 y.o. male with medical history significant for severe AS status post TAVR, PAF on Eliquis , CAD, rectal adenocarcinoma, hypertension, dyslipidemia, type 2 diabetes, and chronic HFpEF who presented to the ED with weakness and inability to ambulate.  He was recently discharged to SNF on 4/8 with noted significant weakness at that time related to COVID-19 infection.  He was brought to ED via EMS who reported that his living conditions were quite bad and patient has been unable to ambulate well for a long period of time.  The call was made to EMS after he fell and he reported that he felt quite cold and could not get warm.   ED Course: Initial temperature 101.4 Fahrenheit with pulse rate of 101 and blood pressure stable.  Platelet count of 123,000 and other labs within normal limits.  Urine analysis suggesting UTI and patient was given 1 L fluid bolus as well as Rocephin .  Blood and urine cultures have been ordered and pending.   Assessment and Plan:  E coli Sepsis, POA, secondary to E coli and Enterobacter UTI E coli Bacteremia  - Monitor ongoing cultures with no growth noted on blood cultures thus far - Continue Rocephin  2 gram IV daily based on C&S data, with plan to stop at 7 days if still in hospital - With generalized weakness associated, will reassess with PT/OT and place on fall precautions, SNF has been recommended by PT, TOC consulted to assist with placement - awaiting insurance auth   PAF/flutter -Currently stable on telemetry -Continue home diltiazem  and metoprolol  -apixaban  for anticoagulation   Mild transaminitis -Likely related to sepsis physiology, continue to trend and monitor -Holding statin for now   OSA -On CPAP   Hypertension -Continue  home medications   Dyslipidemia - Held statin given mild transaminitis, resume at discharge as he is on long acting rosuvastatin    Chronic HFpEF -Currently appears euvolemic and given IV fluid bolus in ED, avoid further IV fluid for now   Type 2 diabetes -Currently without hyperglycemia -Carb diet -SSI  CBG (last 3)  Recent Labs    09/28/23 2038 09/29/23 0710 09/29/23 1135  GLUCAP 152* 133* 126*   Rectal adenocarcinoma -Follows with oncologist at [facility] and currently not on any further chemotherapy or radiation -Apparently not a candidate for surgery -Overall prognosis appears poor -Appreciate palliative consultation for goals of care discussion especially in light of readmission and what appears to be a failure to thrive picture   History of severe AS status post TAVR -Successful TAVR with 26 mm Edwards SAPIEN 3 THV on 02/25/2023   Obesity, class I -BMI 34.44   DVT prophylaxis: apixaban  resumed  Code Status: Full  Family Communication: patient at bedside Disposition: SNF when insurance authorizes anticipating SNF on 5/12    Consultants:   Procedures:   Antimicrobials:   Ceftriaxone  5/6>>  Subjective: No specific complaints.   Objective: Vitals:   09/29/23 0355 09/29/23 0829 09/29/23 0835 09/29/23 1255  BP: 130/79 129/80  120/74  Pulse: 100 (!) 52 (!) 103 87  Resp:    18  Temp: 97.6 F (36.4 C) 98.8 F (37.1 C)  98.5 F (36.9 C)  TempSrc: Oral Oral    SpO2: 93% 97%  94%  Weight:  Height:        Intake/Output Summary (Last 24 hours) at 09/29/2023 1423 Last data filed at 09/28/2023 2016 Gross per 24 hour  Intake --  Output 350 ml  Net -350 ml   Filed Weights   09/22/23 2326 09/23/23 0807  Weight: 112 kg 107.1 kg   Examination:  General exam: Appears calm and frail, weak, NAD. He is oriented x 3 and cooperative. Respiratory system: no increased work of breathing. Cardiovascular system: normal S1 & S2 heard. No JVD, murmurs, rubs, gallops or  clicks. No pedal edema. Gastrointestinal system: Abdomen is nondistended, soft and nontender. No organomegaly or masses felt. Normal bowel sounds heard. Central nervous system: Alert and oriented. No focal neurological deficits. Extremities: Symmetric 5 x 5 power. Skin: No rashes, lesions or ulcers. Psychiatry: Judgement and insight appear normal. Mood & affect appropriate.   Data Reviewed: I have personally reviewed following labs and imaging studies  CBC: Recent Labs  Lab 09/23/23 0042 09/24/23 0502  WBC 5.4 5.0  NEUTROABS 4.4  --   HGB 13.0 12.2*  HCT 39.8 37.9*  MCV 91.9 89.6  PLT 123* 124*    Basic Metabolic Panel: Recent Labs  Lab 09/23/23 0042 09/24/23 0502  NA 137 132*  K 4.3 3.9  CL 105 103  CO2 23 22  GLUCOSE 120* 96  BUN 13 10  CREATININE 0.80 0.78  CALCIUM  8.5* 8.4*  MG  --  1.8    CBG: Recent Labs  Lab 09/28/23 1127 09/28/23 1616 09/28/23 2038 09/29/23 0710 09/29/23 1135  GLUCAP 119* 127* 152* 133* 126*    Recent Results (from the past 240 hours)  Blood culture (routine x 2)     Status: Abnormal   Collection Time: 09/23/23 12:42 AM   Specimen: BLOOD  Result Value Ref Range Status   Specimen Description   Final    BLOOD BLOOD RIGHT ARM Performed at [facility], [address] [place], [place] [address]    Special Requests   Final    BOTTLES DRAWN AEROBIC AND ANAEROBIC Blood Culture adequate volume Performed at [facility], [address] [place], [place] [address]    Culture  Setup Time   Final    IN BOTH AEROBIC AND ANAEROBIC BOTTLES GRAM NEGATIVE RODS Gram Stain Report Called to,Read Back By and Verified With: [facility] AT 1306 09/23/23 BY A WILSON Performed at [facility], [address] [place], [place] [address]    Culture (A)  Final    ESCHERICHIA COLI SUSCEPTIBILITIES PERFORMED ON PREVIOUS CULTURE WITHIN THE LAST 5 DAYS. Performed at [facility], [address] [place], [place] [address]    Report Status 09/25/2023  FINAL  Final  Resp panel by RT-PCR (RSV, Flu A&B, Covid) Anterior Nasal Swab     Status: None   Collection Time: 09/23/23 12:51 AM   Specimen: Anterior Nasal Swab  Result Value Ref Range Status   SARS Coronavirus 2 by RT PCR NEGATIVE NEGATIVE Final    Comment: (NOTE) SARS-CoV-2 target nucleic acids are NOT DETECTED.  The SARS-CoV-2 RNA is generally detectable in upper respiratory specimens during the acute phase of infection. The lowest concentration of SARS-CoV-2 viral copies this assay can detect is 138 copies/mL. A negative result does not preclude SARS-Cov-2 infection and should not be used as the sole basis for treatment or other patient management decisions. A negative result may occur with  improper specimen collection/handling, submission of specimen other than nasopharyngeal swab, presence of viral mutation(s) within  the areas targeted by this assay, and inadequate number of viral copies(<138 copies/mL). A negative result must be combined with clinical observations, patient history, and epidemiological information. The expected result is Negative.  Fact Sheet for Patients:  BloggerCourse.com  Fact Sheet for Healthcare Providers:  SeriousBroker.it  This test is no t yet approved or cleared by the [place]  FDA and  has been authorized for detection and/or diagnosis of SARS-CoV-2 by FDA under an Emergency Use Authorization (EUA). This EUA will remain  in effect (meaning this test can be used) for the duration of the COVID-19 declaration under Section 564(b)(1) of the Act, 21 U.S.C.section 360bbb-3(b)(1), unless the authorization is terminated  or revoked sooner.       Influenza A by PCR NEGATIVE NEGATIVE Final   Influenza B by PCR NEGATIVE NEGATIVE Final    Comment: (NOTE) The Xpert Xpress SARS-CoV-2/FLU/RSV plus assay is intended as an aid in the diagnosis of influenza from Nasopharyngeal swab specimens and should  not be used as a sole basis for treatment. Nasal washings and aspirates are unacceptable for Xpert Xpress SARS-CoV-2/FLU/RSV testing.  Fact Sheet for Patients: BloggerCourse.com  Fact Sheet for Healthcare Providers: SeriousBroker.it  This test is not yet approved or cleared by the [place]  FDA and has been authorized for detection and/or diagnosis of SARS-CoV-2 by FDA under an Emergency Use Authorization (EUA). This EUA will remain in effect (meaning this test can be used) for the duration of the COVID-19 declaration under Section 564(b)(1) of the Act, 21 U.S.C. section 360bbb-3(b)(1), unless the authorization is terminated or revoked.     Resp Syncytial Virus by PCR NEGATIVE NEGATIVE Final    Comment: (NOTE) Fact Sheet for Patients: BloggerCourse.com  Fact Sheet for Healthcare Providers: SeriousBroker.it  This test is not yet approved or cleared by the [place]  FDA and has been authorized for detection and/or diagnosis of SARS-CoV-2 by FDA under an Emergency Use Authorization (EUA). This EUA will remain in effect (meaning this test can be used) for the duration of the COVID-19 declaration under Section 564(b)(1) of the Act, 21 U.S.C. section 360bbb-3(b)(1), unless the authorization is terminated or revoked.  Performed at [facility], [address] [place], [place] [address]   Blood culture (routine x 2)     Status: Abnormal   Collection Time: 09/23/23  1:30 AM   Specimen: BLOOD  Result Value Ref Range Status   Specimen Description   Final    BLOOD RIGHT ANTECUBITAL Performed at [facility], [address], [place], [place] [address]    Special Requests   Final    BOTTLES DRAWN AEROBIC AND ANAEROBIC Blood Culture adequate volume Performed at [facility], [address] [place], [place] [address]    Culture  Setup Time   Final    IN BOTH AEROBIC AND ANAEROBIC  BOTTLES GRAM NEGATIVE RODS Gram Stain Report Called to,Read Back By and Verified With: A JACKSON AT 1352 09/23/23 BY A WILSON CRITICAL RESULT CALLED TO, READ BACK BY AND VERIFIED WITH: RN JADA MUSE ON 09/23/23 @ 1935 BY DRT Performed at [name], [address], [place], [place] [address]    Culture ESCHERICHIA COLI (A)  Final   Report Status 09/25/2023 FINAL  Final   Organism ID, Bacteria ESCHERICHIA COLI  Final   Organism ID, Bacteria ESCHERICHIA COLI  Final      Susceptibility   Escherichia coli - KIRBY BAUER*    CEFAZOLIN  RESISTANT Resistant    Escherichia coli - MIC*  AMPICILLIN >=32 RESISTANT Resistant     CEFEPIME  <=0.12 SENSITIVE Sensitive     CEFTAZIDIME 4 SENSITIVE Sensitive     CEFTRIAXONE  0.5 SENSITIVE Sensitive     CIPROFLOXACIN  <=0.25 SENSITIVE Sensitive     GENTAMICIN <=1 SENSITIVE Sensitive     IMIPENEM <=0.25 SENSITIVE Sensitive     TRIMETH/SULFA >=320 RESISTANT Resistant     AMPICILLIN/SULBACTAM >=32 RESISTANT Resistant     PIP/TAZO <=4 SENSITIVE Sensitive ug/mL    * ESCHERICHIA COLI    ESCHERICHIA COLI  Blood Culture ID Panel (Reflexed)     Status: Abnormal   Collection Time: 09/23/23  1:30 AM  Result Value Ref Range Status   Enterococcus faecalis NOT DETECTED NOT DETECTED Final   Enterococcus Faecium NOT DETECTED NOT DETECTED Final   Listeria monocytogenes NOT DETECTED NOT DETECTED Final   Staphylococcus species NOT DETECTED NOT DETECTED Final   Staphylococcus aureus (BCID) NOT DETECTED NOT DETECTED Final   Staphylococcus epidermidis NOT DETECTED NOT DETECTED Final   Staphylococcus lugdunensis NOT DETECTED NOT DETECTED Final   Streptococcus species NOT DETECTED NOT DETECTED Final   Streptococcus agalactiae NOT DETECTED NOT DETECTED Final   Streptococcus pneumoniae NOT DETECTED NOT DETECTED Final   Streptococcus pyogenes NOT DETECTED NOT DETECTED Final   A.calcoaceticus-baumannii NOT DETECTED NOT DETECTED Final   Bacteroides fragilis NOT DETECTED  NOT DETECTED Final   Enterobacterales DETECTED (A) NOT DETECTED Final    Comment: Enterobacterales represent a large order of gram negative bacteria, not a single organism. CRITICAL RESULT CALLED TO, READ BACK BY AND VERIFIED WITH: RN JADA MUSE ON 09/23/23 @ 1935 BY DRT    Enterobacter cloacae complex NOT DETECTED NOT DETECTED Final   Escherichia coli DETECTED (A) NOT DETECTED Final    Comment: CRITICAL RESULT CALLED TO, READ BACK BY AND VERIFIED WITH: RN JADA MUSE ON 09/23/23 @ 1935 BY DRT    Klebsiella aerogenes NOT DETECTED NOT DETECTED Final   Klebsiella oxytoca NOT DETECTED NOT DETECTED Final   Klebsiella pneumoniae NOT DETECTED NOT DETECTED Final   Proteus species NOT DETECTED NOT DETECTED Final   Salmonella species NOT DETECTED NOT DETECTED Final   Serratia marcescens NOT DETECTED NOT DETECTED Final   Haemophilus influenzae NOT DETECTED NOT DETECTED Final   Neisseria meningitidis NOT DETECTED NOT DETECTED Final   Pseudomonas aeruginosa NOT DETECTED NOT DETECTED Final   Stenotrophomonas maltophilia NOT DETECTED NOT DETECTED Final   Candida albicans NOT DETECTED NOT DETECTED Final   Candida auris NOT DETECTED NOT DETECTED Final   Candida glabrata NOT DETECTED NOT DETECTED Final   Candida krusei NOT DETECTED NOT DETECTED Final   Candida parapsilosis NOT DETECTED NOT DETECTED Final   Candida tropicalis NOT DETECTED NOT DETECTED Final   Cryptococcus neoformans/gattii NOT DETECTED NOT DETECTED Final   CTX-M ESBL NOT DETECTED NOT DETECTED Final   Carbapenem resistance IMP NOT DETECTED NOT DETECTED Final   Carbapenem resistance KPC NOT DETECTED NOT DETECTED Final   Carbapenem resistance NDM NOT DETECTED NOT DETECTED Final   Carbapenem resist OXA 48 LIKE NOT DETECTED NOT DETECTED Final   Carbapenem resistance VIM NOT DETECTED NOT DETECTED Final    Comment: Performed at [facility], [address], [place], [place] [address]  Urine Culture     Status: Abnormal   Collection Time:  09/23/23  4:14 AM   Specimen: Urine, Random  Result Value Ref Range Status   Specimen Description   Final    URINE, RANDOM Performed at [facility], [address],  [place], [place] [address]    Special Requests   Final    NONE Reflexed from [id number] Performed at [facility], [address] [place], [place] [address]    Culture (A)  Final    >=100,000 COLONIES/mL ESCHERICHIA COLI 80,000 COLONIES/mL ENTEROCOCCUS FAECALIS    Report Status 09/25/2023 FINAL  Final   Organism ID, Bacteria ESCHERICHIA COLI (A)  Final   Organism ID, Bacteria ENTEROCOCCUS FAECALIS (A)  Final      Susceptibility   Escherichia coli - MIC*    AMPICILLIN 4 SENSITIVE Sensitive     CEFAZOLIN  <=4 SENSITIVE Sensitive     CEFEPIME  <=0.12 SENSITIVE Sensitive     CEFTRIAXONE  <=0.25 SENSITIVE Sensitive     CIPROFLOXACIN  <=0.25 SENSITIVE Sensitive     GENTAMICIN <=1 SENSITIVE Sensitive     IMIPENEM 0.5 SENSITIVE Sensitive     NITROFURANTOIN  <=16 SENSITIVE Sensitive     TRIMETH/SULFA >=320 RESISTANT Resistant     AMPICILLIN/SULBACTAM <=2 SENSITIVE Sensitive     PIP/TAZO <=4 SENSITIVE Sensitive ug/mL    * >=100,000 COLONIES/mL ESCHERICHIA COLI   Enterococcus faecalis - MIC*    AMPICILLIN <=2 SENSITIVE Sensitive     NITROFURANTOIN  <=16 SENSITIVE Sensitive     VANCOMYCIN  1 SENSITIVE Sensitive     * 80,000 COLONIES/mL ENTEROCOCCUS FAECALIS     Radiology Studies: No results found.   Scheduled Meds:  apixaban   5 mg Oral BID   diltiazem   180 mg Oral Daily   dutasteride   0.5 mg Oral Daily   liver oil-zinc oxide   Topical BID   losartan   50 mg Oral Daily   melatonin  10.5 mg Oral QHS   metoprolol  tartrate  25 mg Oral QHS   metoprolol  tartrate  50 mg Oral Daily   oxybutynin   5 mg Oral QPM   Continuous Infusions:    LOS: 6 days   Time spent: 35 mins  Rheanne Cortopassi Lincoln Renshaw, MD How to contact the [facility] Attending or Consulting provider 7A - 7P or covering provider during after hours 7P -7A, for this patient?   Check the care team in [facility] and look for a) attending/consulting [facility] provider listed and b) the [facility] team listed Log into [email or web address] to find provider on call.  Locate the [facility] provider you are looking for under [facility] and page to a number that you can be directly reached. If you still have difficulty reaching the provider, please page the [facility] (Director on Call) for the Hospitalists listed on amion for assistance.  09/29/2023, 2:23 PM

## 2023-09-30 DIAGNOSIS — G4733 Obstructive sleep apnea (adult) (pediatric): Secondary | ICD-10-CM | POA: Diagnosis not present

## 2023-09-30 DIAGNOSIS — A419 Sepsis, unspecified organism: Secondary | ICD-10-CM

## 2023-09-30 DIAGNOSIS — I2510 Atherosclerotic heart disease of native coronary artery without angina pectoris: Secondary | ICD-10-CM | POA: Diagnosis not present

## 2023-09-30 DIAGNOSIS — I10 Essential (primary) hypertension: Secondary | ICD-10-CM | POA: Diagnosis not present

## 2023-09-30 DIAGNOSIS — I350 Nonrheumatic aortic (valve) stenosis: Secondary | ICD-10-CM | POA: Diagnosis not present

## 2023-09-30 DIAGNOSIS — C20 Malignant neoplasm of rectum: Secondary | ICD-10-CM | POA: Diagnosis not present

## 2023-09-30 DIAGNOSIS — R2681 Unsteadiness on feet: Secondary | ICD-10-CM | POA: Diagnosis not present

## 2023-09-30 DIAGNOSIS — J449 Chronic obstructive pulmonary disease, unspecified: Secondary | ICD-10-CM

## 2023-09-30 DIAGNOSIS — E66811 Obesity, class 1: Secondary | ICD-10-CM | POA: Diagnosis not present

## 2023-09-30 DIAGNOSIS — J9601 Acute respiratory failure with hypoxia: Secondary | ICD-10-CM | POA: Diagnosis not present

## 2023-09-30 DIAGNOSIS — N401 Enlarged prostate with lower urinary tract symptoms: Secondary | ICD-10-CM | POA: Diagnosis not present

## 2023-09-30 DIAGNOSIS — Z743 Need for continuous supervision: Secondary | ICD-10-CM | POA: Diagnosis not present

## 2023-09-30 DIAGNOSIS — R319 Hematuria, unspecified: Secondary | ICD-10-CM | POA: Diagnosis not present

## 2023-09-30 DIAGNOSIS — I480 Paroxysmal atrial fibrillation: Secondary | ICD-10-CM | POA: Diagnosis not present

## 2023-09-30 DIAGNOSIS — I5032 Chronic diastolic (congestive) heart failure: Secondary | ICD-10-CM

## 2023-09-30 DIAGNOSIS — E782 Mixed hyperlipidemia: Secondary | ICD-10-CM | POA: Diagnosis not present

## 2023-09-30 DIAGNOSIS — R350 Frequency of micturition: Secondary | ICD-10-CM | POA: Diagnosis not present

## 2023-09-30 DIAGNOSIS — N139 Obstructive and reflux uropathy, unspecified: Secondary | ICD-10-CM | POA: Diagnosis not present

## 2023-09-30 DIAGNOSIS — R262 Difficulty in walking, not elsewhere classified: Secondary | ICD-10-CM | POA: Diagnosis not present

## 2023-09-30 DIAGNOSIS — I5030 Unspecified diastolic (congestive) heart failure: Secondary | ICD-10-CM | POA: Diagnosis not present

## 2023-09-30 DIAGNOSIS — F4322 Adjustment disorder with anxiety: Secondary | ICD-10-CM | POA: Diagnosis not present

## 2023-09-30 DIAGNOSIS — M6281 Muscle weakness (generalized): Secondary | ICD-10-CM | POA: Diagnosis not present

## 2023-09-30 DIAGNOSIS — R0602 Shortness of breath: Secondary | ICD-10-CM | POA: Diagnosis not present

## 2023-09-30 DIAGNOSIS — N390 Urinary tract infection, site not specified: Secondary | ICD-10-CM

## 2023-09-30 DIAGNOSIS — R531 Weakness: Secondary | ICD-10-CM | POA: Diagnosis not present

## 2023-09-30 DIAGNOSIS — E119 Type 2 diabetes mellitus without complications: Secondary | ICD-10-CM | POA: Diagnosis not present

## 2023-09-30 MED ORDER — LOSARTAN POTASSIUM 50 MG PO TABS: 50.0000 mg | ORAL_TABLET | Freq: Every day | ORAL | Status: DC

## 2023-09-30 MED ORDER — DILTIAZEM HCL ER COATED BEADS 180 MG PO CP24: 180.0000 mg | ORAL_CAPSULE | Freq: Every day | ORAL | Status: DC

## 2023-09-30 MED ORDER — ZINC OXIDE 40 % EX OINT: TOPICAL_OINTMENT | CUTANEOUS | Status: DC | PRN

## 2023-09-30 NOTE — Discharge Summary (Signed)
 Physician Discharge Summary  Gary Carroll WGN:[id number] DOB: 13-Dec-1945 DOA: 09/22/2023  PCP: [facility]  Admit date: 09/22/2023 Discharge date: 09/30/2023  Admitted From:  HOME  Disposition: SNF   Recommendations for Outpatient Follow-up:  Follow up with PCP in 1-2  weeks [facility] weeks  Please obtain BMP/CBC in 1-2 weeks  Home Health:  SNF   Discharge Condition: STABLE   CODE STATUS: FULL DIET: Heart healthy/carb modified    Brief Hospitalization Summary: Please see all hospital notes, images, labs for full details of the hospitalization. Admission provider HPI: 78 y.o. male with medical history significant for severe AS status post TAVR, PAF on Eliquis , CAD, rectal adenocarcinoma, hypertension, dyslipidemia, type 2 diabetes, and chronic HFpEF who presented to the ED with weakness and inability to ambulate.  He was recently discharged to SNF on 4/8 with noted significant weakness at that time related to COVID-19 infection.  He was brought to ED via EMS who reported that his living conditions were quite bad and patient has been unable to ambulate well for a long period of time.  The call was made to EMS after he fell and he reported that he felt quite cold and could not get warm.   ED Course: Initial temperature 101.4 Fahrenheit with pulse rate of 101 and blood pressure stable.  Platelet count of 123,000 and other labs within normal limits.  Urine analysis suggesting UTI and patient was given 1 L fluid bolus as well as Rocephin .  Blood and urine cultures have been ordered and pending.  HOSPITAL COURSE BY LISTED PROBLEMS ADDRESSED  E coli Sepsis, POA, secondary to E coli and Enterobacter UTI E coli Bacteremia  - Monitor ongoing cultures with no growth noted on blood cultures thus far - Continue Rocephin  2 gram IV daily based on C&S data, COMPLETED FULL 7 DAY COURSE - With generalized weakness associated, will reassess with PT/OT and place on  fall precautions, SNF has been recommended by PT, TOC consulted to assist with placement - awaiting insurance auth - DC to SNF in [place] on 09/30/23   PAF/flutter -Currently stable on telemetry -Continue home diltiazem  and metoprolol  -apixaban  for anticoagulation   Mild transaminitis -Likely related to sepsis physiology -resolving with supportive measures   OSA -On CPAP   Hypertension -Continue home medications   Dyslipidemia - Held statin given mild transaminitis, resume at discharge as he is on long acting rosuvastatin    Chronic HFpEF -Currently appears euvolemic and given IV fluid bolus in ED   Type 2 diabetes -Currently without hyperglycemia -Carb diet -SSI CBG (last 3)  Recent Labs    09/28/23 2038 09/29/23 0710 09/29/23 1135  GLUCAP 152* 133* 126*    Rectal adenocarcinoma -Follows with oncologist at [facility] and currently not on any further chemotherapy or radiation -Apparently not a candidate for surgery -Overall prognosis appears poor -Appreciate palliative consultation for goals of care discussion especially in light of readmission and what appears to be a failure to thrive picture   History of severe AS status post TAVR -Successful TAVR with 26 mm Edwards SAPIEN 3 THV on 02/25/2023   Obesity, class I -BMI 34.44   Discharge Diagnoses:  Principal Problem:   Sepsis secondary to UTI [facility]) Active Problems:   Essential hypertension   Hyperlipidemia, unspecified   Benign prostatic hyperplasia with lower urinary tract symptoms   Obstructive sleep apnea syndrome   Aortic stenosis   CAD (coronary artery disease)   (HFpEF) heart failure with preserved  ejection fraction (HCC)   Type 2 diabetes mellitus (HCC)   Chronic obstructive pulmonary disease (HCC)   Discharge Instructions:  Allergies as of 09/30/2023       Reactions   Diovan [valsartan] Cough   Tagamet [cimetidine] Other (See Comments)   Irritability         Medication List     TAKE these  medications    acetaminophen  650 MG CR tablet Commonly known as: TYLENOL  Take 1,300 mg by mouth every 8 (eight) hours as needed for pain.   apixaban  5 MG Tabs tablet Commonly known as: ELIQUIS  Take 1 tablet (5 mg total) by mouth 2 (two) times daily.   ascorbic acid  500 MG tablet Commonly known as: VITAMIN C  Take 500 mg by mouth daily.   diltiazem  180 MG 24 hr capsule Commonly known as: Cardizem  CD Take 1 capsule (180 mg total) by mouth daily. What changed:  medication strength how much to take   dutasteride  0.5 MG capsule Commonly known as: AVODART  Take 0.5 mg by mouth daily.   Fish Oil 1000 MG Caps Take 1,000 mg by mouth in the morning and at bedtime.   liver oil-zinc oxide 40 % ointment Commonly known as: DESITIN Apply topically as needed for irritation.   losartan  50 MG tablet Commonly known as: COZAAR  Take 1 tablet (50 mg total) by mouth daily.   Magnesium  500 MG Caps Take 500 mg by mouth daily.   Melatonin 10 MG Caps Take 10 mg by mouth at bedtime.   metFORMIN  500 MG tablet Commonly known as: GLUCOPHAGE  Take 500 mg by mouth 2 (two) times daily with a meal.   metoprolol  tartrate 50 MG tablet Commonly known as: LOPRESSOR  Take 1 tablet (50 mg total) by mouth 2 (two) times daily. What changed: Another medication with the same name was removed. Continue taking this medication, and follow the directions you see here.   Multivital tablet Take 1 tablet by mouth daily.   oxybutynin  5 MG tablet Commonly known as: DITROPAN  Take 5 mg by mouth every evening.   potassium chloride  10 MEQ tablet Commonly known as: KLOR-CON  Take 1 tablet (10 mEq total) by mouth daily.   rosuvastatin  20 MG tablet Commonly known as: CRESTOR  Take 20 mg by mouth daily.        Follow-up Information     [facility] Schedule an appointment as soon as possible for a visit in 1 week(s).   Why: Hospital Follow Up Contact information: [address] [facility]  [place] [address] [phone]                Allergies  Allergen Reactions   Diovan [Valsartan] Cough   Tagamet [Cimetidine] Other (See Comments)    Irritability    Allergies as of 09/30/2023       Reactions   Diovan [valsartan] Cough   Tagamet [cimetidine] Other (See Comments)   Irritability         Medication List     TAKE these medications    acetaminophen  650 MG CR tablet Commonly known as: TYLENOL  Take 1,300 mg by mouth every 8 (eight) hours as needed for pain.   apixaban  5 MG Tabs tablet Commonly known as: ELIQUIS  Take 1 tablet (5 mg total) by mouth 2 (two) times daily.   ascorbic acid  500 MG tablet Commonly known as: VITAMIN C  Take 500 mg by mouth daily.   diltiazem  180 MG 24 hr capsule Commonly known as: Cardizem  CD Take 1 capsule (180 mg total)  by mouth daily. What changed:  medication strength how much to take   dutasteride  0.5 MG capsule Commonly known as: AVODART  Take 0.5 mg by mouth daily.   Fish Oil 1000 MG Caps Take 1,000 mg by mouth in the morning and at bedtime.   liver oil-zinc oxide 40 % ointment Commonly known as: DESITIN Apply topically as needed for irritation.   losartan  50 MG tablet Commonly known as: COZAAR  Take 1 tablet (50 mg total) by mouth daily.   Magnesium  500 MG Caps Take 500 mg by mouth daily.   Melatonin 10 MG Caps Take 10 mg by mouth at bedtime.   metFORMIN  500 MG tablet Commonly known as: GLUCOPHAGE  Take 500 mg by mouth 2 (two) times daily with a meal.   metoprolol  tartrate 50 MG tablet Commonly known as: LOPRESSOR  Take 1 tablet (50 mg total) by mouth 2 (two) times daily. What changed: Another medication with the same name was removed. Continue taking this medication, and follow the directions you see here.   Multivital tablet Take 1 tablet by mouth daily.   oxybutynin  5 MG tablet Commonly known as: DITROPAN  Take 5 mg by mouth every evening.   potassium chloride  10 MEQ  tablet Commonly known as: KLOR-CON  Take 1 tablet (10 mEq total) by mouth daily.   rosuvastatin  20 MG tablet Commonly known as: CRESTOR  Take 20 mg by mouth daily.        Procedures/Studies: DG Pelvis Portable Result Date: 09/23/2023 CLINICAL DATA:  Fall EXAM: PORTABLE PELVIS 1-2 VIEWS COMPARISON:  None Available. FINDINGS: There is no evidence of pelvic fracture or diastasis. No pelvic bone lesions are seen. IMPRESSION: Negative. Electronically Signed   By: Janeece Mechanic M.D.   On: 09/23/2023 00:10   DG Chest Port 1 View Result Date: 09/23/2023 CLINICAL DATA:  Fall EXAM: PORTABLE CHEST 1 VIEW COMPARISON:  08/21/2023 FINDINGS: Prior aortic valve repair. Heart upper limits normal in size. Aortic atherosclerosis. No confluent opacities, effusions or pneumothorax. No acute bony abnormality. IMPRESSION: No active disease. Electronically Signed   By: Janeece Mechanic M.D.   On: 09/23/2023 00:10     Subjective: Pt reports that he feels well he is eager to participate with SNF rehab and will [facility]   Discharge Exam: Vitals:   09/30/23 0351 09/30/23 1026  BP: 134/71 128/70  Pulse: 81 99  Resp:    Temp: 98.7 F (37.1 C) (!) 97.5 F (36.4 C)  SpO2: 92% 96%   Vitals:   09/29/23 1255 09/29/23 2051 09/30/23 0351 09/30/23 1026  BP: 120/74 114/86 134/71 128/70  Pulse: 87 93 81 99  Resp: 18     Temp: 98.5 F (36.9 C) 98.8 F (37.1 C) 98.7 F (37.1 C) (!) 97.5 F (36.4 C)  TempSrc:  Oral Oral Oral  SpO2: 94% 96% 92% 96%  Weight:      Height:       General: Pt is alert, awake, not in acute distress Cardiovascular: normal S1/S2 +, no rubs, no gallops Respiratory: CTA bilaterally, no wheezing, no rhonchi Abdominal: Soft, NT, ND, bowel sounds + Extremities: no edema, no cyanosis   The results of significant diagnostics from this hospitalization (including imaging, microbiology, ancillary and laboratory) are listed below for reference.     Microbiology: Recent  Results (from the past 240 hours)  Blood culture (routine x 2)     Status: Abnormal   Collection Time: 09/23/23 12:42 AM   Specimen: BLOOD  Result Value Ref Range  Status   Specimen Description   Final    BLOOD BLOOD RIGHT ARM Performed at [facility], [address], [place], [place] [address]    Special Requests   Final    BOTTLES DRAWN AEROBIC AND ANAEROBIC Blood Culture adequate volume Performed at [facility], [address] [place], [place] [address]    Culture  Setup Time   Final    IN BOTH AEROBIC AND ANAEROBIC BOTTLES GRAM NEGATIVE RODS Gram Stain Report Called to,Read Back By and Verified With: J [facility] AT 1306 09/23/23 BY A WILSON Performed at [facility], [address], [place], [place] [address]    Culture (A)  Final    ESCHERICHIA COLI SUSCEPTIBILITIES PERFORMED ON PREVIOUS CULTURE WITHIN THE LAST 5 DAYS. Performed at [facility], [address] [place], [place] [address]    Report Status 09/25/2023 FINAL  Final  Resp panel by RT-PCR (RSV, Flu A&B, Covid) Anterior Nasal Swab     Status: None   Collection Time: 09/23/23 12:51 AM   Specimen: Anterior Nasal Swab  Result Value Ref Range Status   SARS Coronavirus 2 by RT PCR NEGATIVE NEGATIVE Final    Comment: (NOTE) SARS-CoV-2 target nucleic acids are NOT DETECTED.  The SARS-CoV-2 RNA is generally detectable in upper respiratory specimens during the acute phase of infection. The lowest concentration of SARS-CoV-2 viral copies this assay can detect is 138 copies/mL. A negative result does not preclude SARS-Cov-2 infection and should not be used as the sole basis for treatment or other patient management decisions. A negative result may occur with  improper specimen collection/handling, submission of specimen other than nasopharyngeal swab, presence of viral mutation(s) within the areas targeted by this assay, and inadequate number of viral copies(<138 copies/mL). A negative result must be combined  with clinical observations, patient history, and epidemiological information. The expected result is Negative.  Fact Sheet for Patients:  BloggerCourse.com  Fact Sheet for Healthcare Providers:  SeriousBroker.it  This test is no t yet approved or cleared by the [place]  FDA and  has been authorized for detection and/or diagnosis of SARS-CoV-2 by FDA under an Emergency Use Authorization (EUA). This EUA will remain  in effect (meaning this test can be used) for the duration of the COVID-19 declaration under Section 564(b)(1) of the Act, 21 U.S.C.section 360bbb-3(b)(1), unless the authorization is terminated  or revoked sooner.       Influenza A by PCR NEGATIVE NEGATIVE Final   Influenza B by PCR NEGATIVE NEGATIVE Final    Comment: (NOTE) The Xpert Xpress SARS-CoV-2/FLU/RSV plus assay is intended as an aid in the diagnosis of influenza from Nasopharyngeal swab specimens and should not be used as a sole basis for treatment. Nasal washings and aspirates are unacceptable for Xpert Xpress SARS-CoV-2/FLU/RSV testing.  Fact Sheet for Patients: BloggerCourse.com  Fact Sheet for Healthcare Providers: SeriousBroker.it  This test is not yet approved or cleared by the [place]  FDA and has been authorized for detection and/or diagnosis of SARS-CoV-2 by FDA under an Emergency Use Authorization (EUA). This EUA will remain in effect (meaning this test can be used) for the duration of the COVID-19 declaration under Section 564(b)(1) of the Act, 21 U.S.C. section 360bbb-3(b)(1), unless the authorization is terminated or revoked.     Resp Syncytial Virus by PCR NEGATIVE NEGATIVE Final    Comment: (NOTE) Fact Sheet for Patients: BloggerCourse.com  Fact Sheet for Healthcare Providers: SeriousBroker.it  This test is not yet approved  or cleared by the  [place]  FDA and has been authorized for detection and/or diagnosis of SARS-CoV-2 by FDA under an Emergency Use Authorization (EUA). This EUA will remain in effect (meaning this test can be used) for the duration of the COVID-19 declaration under Section 564(b)(1) of the Act, 21 U.S.C. section 360bbb-3(b)(1), unless the authorization is terminated or revoked.  Performed at [facility], [address] [place], [place] [address]   Blood culture (routine x 2)     Status: Abnormal   Collection Time: 09/23/23  1:30 AM   Specimen: BLOOD  Result Value Ref Range Status   Specimen Description   Final    BLOOD RIGHT ANTECUBITAL Performed at [facility], [address] [place], [place] [address]    Special Requests   Final    BOTTLES DRAWN AEROBIC AND ANAEROBIC Blood Culture adequate volume Performed at [facility], [address], [place], [place] [address]    Culture  Setup Time   Final    IN BOTH AEROBIC AND ANAEROBIC BOTTLES GRAM NEGATIVE RODS Gram Stain Report Called to,Read Back By and Verified With: A JACKSON AT 1352 09/23/23 BY A WILSON CRITICAL RESULT CALLED TO, READ BACK BY AND VERIFIED WITH: RN JADA MUSE ON 09/23/23 @ 1935 BY DRT Performed at [facility], [address] [place], [place] [address]    Culture ESCHERICHIA COLI (A)  Final   Report Status 09/25/2023 FINAL  Final   Organism ID, Bacteria ESCHERICHIA COLI  Final   Organism ID, Bacteria ESCHERICHIA COLI  Final      Susceptibility   Escherichia coli - KIRBY BAUER*    CEFAZOLIN  RESISTANT Resistant    Escherichia coli - MIC*    AMPICILLIN >=32 RESISTANT Resistant     CEFEPIME  <=0.12 SENSITIVE Sensitive     CEFTAZIDIME 4 SENSITIVE Sensitive     CEFTRIAXONE  0.5 SENSITIVE Sensitive     CIPROFLOXACIN  <=0.25 SENSITIVE Sensitive     GENTAMICIN <=1 SENSITIVE Sensitive     IMIPENEM <=0.25 SENSITIVE Sensitive     TRIMETH/SULFA >=320 RESISTANT Resistant     AMPICILLIN/SULBACTAM >=32 RESISTANT  Resistant     PIP/TAZO <=4 SENSITIVE Sensitive ug/mL    * ESCHERICHIA COLI    ESCHERICHIA COLI  Blood Culture ID Panel (Reflexed)     Status: Abnormal   Collection Time: 09/23/23  1:30 AM  Result Value Ref Range Status   Enterococcus faecalis NOT DETECTED NOT DETECTED Final   Enterococcus Faecium NOT DETECTED NOT DETECTED Final   Listeria monocytogenes NOT DETECTED NOT DETECTED Final   Staphylococcus species NOT DETECTED NOT DETECTED Final   Staphylococcus aureus (BCID) NOT DETECTED NOT DETECTED Final   Staphylococcus epidermidis NOT DETECTED NOT DETECTED Final   Staphylococcus lugdunensis NOT DETECTED NOT DETECTED Final   Streptococcus species NOT DETECTED NOT DETECTED Final   Streptococcus agalactiae NOT DETECTED NOT DETECTED Final   Streptococcus pneumoniae NOT DETECTED NOT DETECTED Final   Streptococcus pyogenes NOT DETECTED NOT DETECTED Final   A.calcoaceticus-baumannii NOT DETECTED NOT DETECTED Final   Bacteroides fragilis NOT DETECTED NOT DETECTED Final   Enterobacterales DETECTED (A) NOT DETECTED Final    Comment: Enterobacterales represent a large order of gram negative bacteria, not a single organism. CRITICAL RESULT CALLED TO, READ BACK BY AND VERIFIED WITH: RN JADA MUSE ON 09/23/23 @ 1935 BY DRT    Enterobacter cloacae complex NOT DETECTED NOT DETECTED Final   Escherichia coli DETECTED (A) NOT DETECTED Final    Comment: CRITICAL RESULT CALLED TO, READ BACK BY AND VERIFIED  WITH: RN JADA MUSE ON 09/23/23 @ 1935 BY DRT    Klebsiella aerogenes NOT DETECTED NOT DETECTED Final   Klebsiella oxytoca NOT DETECTED NOT DETECTED Final   Klebsiella pneumoniae NOT DETECTED NOT DETECTED Final   Proteus species NOT DETECTED NOT DETECTED Final   Salmonella species NOT DETECTED NOT DETECTED Final   Serratia marcescens NOT DETECTED NOT DETECTED Final   Haemophilus influenzae NOT DETECTED NOT DETECTED Final   Neisseria meningitidis NOT DETECTED NOT DETECTED Final   Pseudomonas aeruginosa  NOT DETECTED NOT DETECTED Final   Stenotrophomonas maltophilia NOT DETECTED NOT DETECTED Final   Candida albicans NOT DETECTED NOT DETECTED Final   Candida auris NOT DETECTED NOT DETECTED Final   Candida glabrata NOT DETECTED NOT DETECTED Final   Candida krusei NOT DETECTED NOT DETECTED Final   Candida parapsilosis NOT DETECTED NOT DETECTED Final   Candida tropicalis NOT DETECTED NOT DETECTED Final   Cryptococcus neoformans/gattii NOT DETECTED NOT DETECTED Final   CTX-M ESBL NOT DETECTED NOT DETECTED Final   Carbapenem resistance IMP NOT DETECTED NOT DETECTED Final   Carbapenem resistance KPC NOT DETECTED NOT DETECTED Final   Carbapenem resistance NDM NOT DETECTED NOT DETECTED Final   Carbapenem resist OXA 48 LIKE NOT DETECTED NOT DETECTED Final   Carbapenem resistance VIM NOT DETECTED NOT DETECTED Final    Comment: Performed at [facility], [address], [place], [place] [address]  Urine Culture     Status: Abnormal   Collection Time: 09/23/23  4:14 AM   Specimen: Urine, Random  Result Value Ref Range Status   Specimen Description   Final    URINE, RANDOM Performed at [facility], [address] [place], [place] [address]    Special Requests   Final    NONE Reflexed from [id number] Performed at [facility], [address], [place], [place] [address]    Culture (A)  Final    >=100,000 COLONIES/mL ESCHERICHIA COLI 80,000 COLONIES/mL ENTEROCOCCUS FAECALIS    Report Status 09/25/2023 FINAL  Final   Organism ID, Bacteria ESCHERICHIA COLI (A)  Final   Organism ID, Bacteria ENTEROCOCCUS FAECALIS (A)  Final      Susceptibility   Escherichia coli - MIC*    AMPICILLIN 4 SENSITIVE Sensitive     CEFAZOLIN  <=4 SENSITIVE Sensitive     CEFEPIME  <=0.12 SENSITIVE Sensitive     CEFTRIAXONE  <=0.25 SENSITIVE Sensitive     CIPROFLOXACIN  <=0.25 SENSITIVE Sensitive     GENTAMICIN <=1 SENSITIVE Sensitive     IMIPENEM 0.5 SENSITIVE Sensitive     NITROFURANTOIN  <=16 SENSITIVE Sensitive      TRIMETH/SULFA >=320 RESISTANT Resistant     AMPICILLIN/SULBACTAM <=2 SENSITIVE Sensitive     PIP/TAZO <=4 SENSITIVE Sensitive ug/mL    * >=100,000 COLONIES/mL ESCHERICHIA COLI   Enterococcus faecalis - MIC*    AMPICILLIN <=2 SENSITIVE Sensitive     NITROFURANTOIN  <=16 SENSITIVE Sensitive     VANCOMYCIN  1 SENSITIVE Sensitive     * 80,000 COLONIES/mL ENTEROCOCCUS FAECALIS     Labs: BNP (last 3 results) Recent Labs    12/20/22 2305 01/20/23 0655 01/21/23 0521  BNP 533.0* 417.0* 470.0*   Basic Metabolic Panel: Recent Labs  Lab 09/24/23 0502  NA 132*  K 3.9  CL 103  CO2 22  GLUCOSE 96  BUN 10  CREATININE 0.78  CALCIUM  8.4*  MG 1.8   Liver Function Tests: Recent Labs  Lab 09/24/23 0502  AST 58*  ALT 56*  ALKPHOS 61  BILITOT 0.6  PROT 6.1*  ALBUMIN 2.7*   No results for input(s): "LIPASE", "AMYLASE" in the last 168 hours. No results for input(s): "AMMONIA" in the last 168 hours. CBC: Recent Labs  Lab 09/24/23 0502  WBC 5.0  HGB 12.2*  HCT 37.9*  MCV 89.6  PLT 124*   Cardiac Enzymes: No results for input(s): "CKTOTAL", "CKMB", "CKMBINDEX", "TROPONINI" in the last 168 hours. BNP: Invalid input(s): "POCBNP" CBG: Recent Labs  Lab 09/28/23 1127 09/28/23 1616 09/28/23 2038 09/29/23 0710 09/29/23 1135  GLUCAP 119* 127* 152* 133* 126*   D-Dimer No results for input(s): "DDIMER" in the last 72 hours. Hgb A1c No results for input(s): "HGBA1C" in the last 72 hours. Lipid Profile No results for input(s): "CHOL", "HDL", "LDLCALC", "TRIG", "CHOLHDL", "LDLDIRECT" in the last 72 hours. Thyroid function studies No results for input(s): "TSH", "T4TOTAL", "T3FREE", "THYROIDAB" in the last 72 hours.  Invalid input(s): "FREET3" Anemia work up No results for input(s): "VITAMINB12", "FOLATE", "FERRITIN", "TIBC", "IRON", "RETICCTPCT" in the last 72 hours. Urinalysis    Component Value Date/Time   COLORURINE AMBER (A) 09/23/2023 0414   APPEARANCEUR CLOUDY (A)  09/23/2023 0414   LABSPEC 1.019 09/23/2023 0414   PHURINE 6.0 09/23/2023 0414   GLUCOSEU NEGATIVE 09/23/2023 0414   HGBUR NEGATIVE 09/23/2023 0414   BILIRUBINUR NEGATIVE 09/23/2023 0414   BILIRUBINUR neg 08/31/2012 1203   KETONESUR NEGATIVE 09/23/2023 0414   PROTEINUR 30 (A) 09/23/2023 0414   UROBILINOGEN negative 08/31/2012 1203   UROBILINOGEN 0.2 05/31/2010 1125   NITRITE POSITIVE (A) 09/23/2023 0414   LEUKOCYTESUR LARGE (A) 09/23/2023 0414   Sepsis Labs Recent Labs  Lab 09/24/23 0502  WBC 5.0   Microbiology Recent Results (from the past 240 hours)  Blood culture (routine x 2)     Status: Abnormal   Collection Time: 09/23/23 12:42 AM   Specimen: BLOOD  Result Value Ref Range Status   Specimen Description   Final    BLOOD BLOOD RIGHT ARM Performed at [facility], [address] [place], [place] [address]    Special Requests   Final    BOTTLES DRAWN AEROBIC AND ANAEROBIC Blood Culture adequate volume Performed at [facility], [address] [place], [place] [address]    Culture  Setup Time   Final    IN BOTH AEROBIC AND ANAEROBIC BOTTLES GRAM NEGATIVE RODS Gram Stain Report Called to,Read Back By and Verified With: J [facility] AT 1306 09/23/23 BY A WILSON Performed at [facility], [address] [place], [place] [address]    Culture (A)  Final    ESCHERICHIA COLI SUSCEPTIBILITIES PERFORMED ON PREVIOUS CULTURE WITHIN THE LAST 5 DAYS. Performed at [facility], [address], [place], [place] [address]    Report Status 09/25/2023 FINAL  Final  Resp panel by RT-PCR (RSV, Flu A&B, Covid) Anterior Nasal Swab     Status: None   Collection Time: 09/23/23 12:51 AM   Specimen: Anterior Nasal Swab  Result Value Ref Range Status   SARS Coronavirus 2 by RT PCR NEGATIVE NEGATIVE Final    Comment: (NOTE) SARS-CoV-2 target nucleic acids are NOT DETECTED.  The SARS-CoV-2 RNA is generally detectable in upper respiratory specimens during the acute phase of infection. The  lowest concentration of SARS-CoV-2 viral copies this assay can detect is 138 copies/mL. A negative result does not preclude SARS-Cov-2 infection and should not be used as the sole basis for treatment or other patient management decisions. A negative result may occur with  improper specimen collection/handling, submission  of specimen other than nasopharyngeal swab, presence of viral mutation(s) within the areas targeted by this assay, and inadequate number of viral copies(<138 copies/mL). A negative result must be combined with clinical observations, patient history, and epidemiological information. The expected result is Negative.  Fact Sheet for Patients:  BloggerCourse.com  Fact Sheet for Healthcare Providers:  SeriousBroker.it  This test is no t yet approved or cleared by the [place]  FDA and  has been authorized for detection and/or diagnosis of SARS-CoV-2 by FDA under an Emergency Use Authorization (EUA). This EUA will remain  in effect (meaning this test can be used) for the duration of the COVID-19 declaration under Section 564(b)(1) of the Act, 21 U.S.C.section 360bbb-3(b)(1), unless the authorization is terminated  or revoked sooner.       Influenza A by PCR NEGATIVE NEGATIVE Final   Influenza B by PCR NEGATIVE NEGATIVE Final    Comment: (NOTE) The Xpert Xpress SARS-CoV-2/FLU/RSV plus assay is intended as an aid in the diagnosis of influenza from Nasopharyngeal swab specimens and should not be used as a sole basis for treatment. Nasal washings and aspirates are unacceptable for Xpert Xpress SARS-CoV-2/FLU/RSV testing.  Fact Sheet for Patients: BloggerCourse.com  Fact Sheet for Healthcare Providers: SeriousBroker.it  This test is not yet approved or cleared by the [place]  FDA and has been authorized for detection and/or diagnosis of SARS-CoV-2 by FDA under  an Emergency Use Authorization (EUA). This EUA will remain in effect (meaning this test can be used) for the duration of the COVID-19 declaration under Section 564(b)(1) of the Act, 21 U.S.C. section 360bbb-3(b)(1), unless the authorization is terminated or revoked.     Resp Syncytial Virus by PCR NEGATIVE NEGATIVE Final    Comment: (NOTE) Fact Sheet for Patients: BloggerCourse.com  Fact Sheet for Healthcare Providers: SeriousBroker.it  This test is not yet approved or cleared by the [place]  FDA and has been authorized for detection and/or diagnosis of SARS-CoV-2 by FDA under an Emergency Use Authorization (EUA). This EUA will remain in effect (meaning this test can be used) for the duration of the COVID-19 declaration under Section 564(b)(1) of the Act, 21 U.S.C. section 360bbb-3(b)(1), unless the authorization is terminated or revoked.  Performed at [facility], [address] [place], [place] [address]   Blood culture (routine x 2)     Status: Abnormal   Collection Time: 09/23/23  1:30 AM   Specimen: BLOOD  Result Value Ref Range Status   Specimen Description   Final    BLOOD RIGHT ANTECUBITAL Performed at [facility], [address] [place], [place] [address]    Special Requests   Final    BOTTLES DRAWN AEROBIC AND ANAEROBIC Blood Culture adequate volume Performed at [facility], [address], [place], [place] [address]    Culture  Setup Time   Final    IN BOTH AEROBIC AND ANAEROBIC BOTTLES GRAM NEGATIVE RODS Gram Stain Report Called to,Read Back By and Verified With: A JACKSON AT 1352 09/23/23 BY A WILSON CRITICAL RESULT CALLED TO, READ BACK BY AND VERIFIED WITH: RN JADA MUSE ON 09/23/23 @ 1935 BY DRT Performed at [facility], [address] [place], [place] [address]    Culture ESCHERICHIA COLI (A)  Final   Report Status 09/25/2023 FINAL  Final   Organism ID, Bacteria ESCHERICHIA COLI  Final    Organism ID, Bacteria ESCHERICHIA COLI  Final      Susceptibility   Escherichia coli - KIRBY BAUER*  CEFAZOLIN  RESISTANT Resistant    Escherichia coli - MIC*    AMPICILLIN >=32 RESISTANT Resistant     CEFEPIME  <=0.12 SENSITIVE Sensitive     CEFTAZIDIME 4 SENSITIVE Sensitive     CEFTRIAXONE  0.5 SENSITIVE Sensitive     CIPROFLOXACIN  <=0.25 SENSITIVE Sensitive     GENTAMICIN <=1 SENSITIVE Sensitive     IMIPENEM <=0.25 SENSITIVE Sensitive     TRIMETH/SULFA >=320 RESISTANT Resistant     AMPICILLIN/SULBACTAM >=32 RESISTANT Resistant     PIP/TAZO <=4 SENSITIVE Sensitive ug/mL    * ESCHERICHIA COLI    ESCHERICHIA COLI  Blood Culture ID Panel (Reflexed)     Status: Abnormal   Collection Time: 09/23/23  1:30 AM  Result Value Ref Range Status   Enterococcus faecalis NOT DETECTED NOT DETECTED Final   Enterococcus Faecium NOT DETECTED NOT DETECTED Final   Listeria monocytogenes NOT DETECTED NOT DETECTED Final   Staphylococcus species NOT DETECTED NOT DETECTED Final   Staphylococcus aureus (BCID) NOT DETECTED NOT DETECTED Final   Staphylococcus epidermidis NOT DETECTED NOT DETECTED Final   Staphylococcus lugdunensis NOT DETECTED NOT DETECTED Final   Streptococcus species NOT DETECTED NOT DETECTED Final   Streptococcus agalactiae NOT DETECTED NOT DETECTED Final   Streptococcus pneumoniae NOT DETECTED NOT DETECTED Final   Streptococcus pyogenes NOT DETECTED NOT DETECTED Final   A.calcoaceticus-baumannii NOT DETECTED NOT DETECTED Final   Bacteroides fragilis NOT DETECTED NOT DETECTED Final   Enterobacterales DETECTED (A) NOT DETECTED Final    Comment: Enterobacterales represent a large order of gram negative bacteria, not a single organism. CRITICAL RESULT CALLED TO, READ BACK BY AND VERIFIED WITH: RN JADA MUSE ON 09/23/23 @ 1935 BY DRT    Enterobacter cloacae complex NOT DETECTED NOT DETECTED Final   Escherichia coli DETECTED (A) NOT DETECTED Final    Comment: CRITICAL RESULT CALLED TO, READ  BACK BY AND VERIFIED WITH: RN JADA MUSE ON 09/23/23 @ 1935 BY DRT    Klebsiella aerogenes NOT DETECTED NOT DETECTED Final   Klebsiella oxytoca NOT DETECTED NOT DETECTED Final   Klebsiella pneumoniae NOT DETECTED NOT DETECTED Final   Proteus species NOT DETECTED NOT DETECTED Final   Salmonella species NOT DETECTED NOT DETECTED Final   Serratia marcescens NOT DETECTED NOT DETECTED Final   Haemophilus influenzae NOT DETECTED NOT DETECTED Final   Neisseria meningitidis NOT DETECTED NOT DETECTED Final   Pseudomonas aeruginosa NOT DETECTED NOT DETECTED Final   Stenotrophomonas maltophilia NOT DETECTED NOT DETECTED Final   Candida albicans NOT DETECTED NOT DETECTED Final   Candida auris NOT DETECTED NOT DETECTED Final   Candida glabrata NOT DETECTED NOT DETECTED Final   Candida krusei NOT DETECTED NOT DETECTED Final   Candida parapsilosis NOT DETECTED NOT DETECTED Final   Candida tropicalis NOT DETECTED NOT DETECTED Final   Cryptococcus neoformans/gattii NOT DETECTED NOT DETECTED Final   CTX-M ESBL NOT DETECTED NOT DETECTED Final   Carbapenem resistance IMP NOT DETECTED NOT DETECTED Final   Carbapenem resistance KPC NOT DETECTED NOT DETECTED Final   Carbapenem resistance NDM NOT DETECTED NOT DETECTED Final   Carbapenem resist OXA 48 LIKE NOT DETECTED NOT DETECTED Final   Carbapenem resistance VIM NOT DETECTED NOT DETECTED Final    Comment: Performed at [facility], [address], [place], [place] [address]  Urine Culture     Status: Abnormal   Collection Time: 09/23/23  4:14 AM   Specimen: Urine, Random  Result Value Ref Range Status   Specimen Description   Final  URINE, RANDOM Performed at [facility], [address], [place], [place] [address]    Special Requests   Final    NONE Reflexed from [id number] Performed at [facility], [address] [place], [place] [address]    Culture (A)  Final    >=100,000 COLONIES/mL ESCHERICHIA COLI 80,000 COLONIES/mL ENTEROCOCCUS FAECALIS     Report Status 09/25/2023 FINAL  Final   Organism ID, Bacteria ESCHERICHIA COLI (A)  Final   Organism ID, Bacteria ENTEROCOCCUS FAECALIS (A)  Final      Susceptibility   Escherichia coli - MIC*    AMPICILLIN 4 SENSITIVE Sensitive     CEFAZOLIN  <=4 SENSITIVE Sensitive     CEFEPIME  <=0.12 SENSITIVE Sensitive     CEFTRIAXONE  <=0.25 SENSITIVE Sensitive     CIPROFLOXACIN  <=0.25 SENSITIVE Sensitive     GENTAMICIN <=1 SENSITIVE Sensitive     IMIPENEM 0.5 SENSITIVE Sensitive     NITROFURANTOIN  <=16 SENSITIVE Sensitive     TRIMETH/SULFA >=320 RESISTANT Resistant     AMPICILLIN/SULBACTAM <=2 SENSITIVE Sensitive     PIP/TAZO <=4 SENSITIVE Sensitive ug/mL    * >=100,000 COLONIES/mL ESCHERICHIA COLI   Enterococcus faecalis - MIC*    AMPICILLIN <=2 SENSITIVE Sensitive     NITROFURANTOIN  <=16 SENSITIVE Sensitive     VANCOMYCIN  1 SENSITIVE Sensitive     * 80,000 COLONIES/mL ENTEROCOCCUS FAECALIS   Time coordinating discharge: 41 mins   SIGNED:  Faustino Hook, MD  [facility] 09/30/2023, 12:44 PM How to contact the [facility] Attending or Consulting provider 7A - 7P or covering provider during after hours 7P -7A, for this patient?  Check the care team in [facility] and look for a) attending/consulting [facility] provider listed and b) the [facility] team listed Log into [email or web address] and use [facility]'s universal password to access. If you do not have the password, please contact the hospital operator. Locate the [facility] provider you are looking for under [facility] and page to a number that you can be directly reached. If you still have difficulty reaching the provider, please page the [facility] (Director on Call) for the Hospitalists listed on amion for assistance.

## 2023-09-30 NOTE — Discharge Instructions (Signed)
IMPORTANT INFORMATION: PAY CLOSE ATTENTION   PHYSICIAN DISCHARGE INSTRUCTIONS  Follow with Primary care provider  [facility]  and other consultants as instructed by your Hospitalist Physician  SEEK MEDICAL CARE OR RETURN TO EMERGENCY ROOM IF SYMPTOMS COME BACK, WORSEN OR NEW PROBLEM DEVELOPS   Please note: You were cared for by a hospitalist during your hospital stay. Every effort will be made to forward records to your primary care provider.  You can request that your primary care provider send for your hospital records if they have not received them.  Once you are discharged, your primary care physician will handle any further medical issues. Please note that NO REFILLS for any discharge medications will be authorized once you are discharged, as it is imperative that you return to your primary care physician (or establish a relationship with a primary care physician if you do not have one) for your post hospital discharge needs so that they can reassess your need for medications and monitor your lab values.  Please get a complete blood count and chemistry panel checked by your Primary MD at your next visit, and again as instructed by your Primary MD.  Get Medicines reviewed and adjusted: Please take all your medications with you for your next visit with your Primary MD  Laboratory/radiological data: Please request your Primary MD to go over all hospital tests and procedure/radiological results at the follow up, please ask your primary care provider to get all Hospital records sent to his/her office.  In some cases, they will be blood work, cultures and biopsy results pending at the time of your discharge. Please request that your primary care provider follow up on these results.  If you are diabetic, please bring your blood sugar readings with you to your follow up appointment with primary care.    Please call and make your follow up appointments as soon as possible.    Also  Note the following: If you experience worsening of your admission symptoms, develop shortness of breath, life threatening emergency, suicidal or homicidal thoughts you must seek medical attention immediately by calling 911 or calling your MD immediately  if symptoms less severe.  You must read complete instructions/literature along with all the possible adverse reactions/side effects for all the Medicines you take and that have been prescribed to you. Take any new Medicines after you have completely understood and accpet all the possible adverse reactions/side effects.   Do not drive when taking Pain medications or sleeping medications (Benzodiazepines)  Do not take more than prescribed Pain, Sleep and Anxiety Medications. It is not advisable to combine anxiety,sleep and pain medications without talking with your primary care practitioner  Special Instructions: If you have smoked or chewed Tobacco  in the last 2 yrs please stop smoking, stop any regular Alcohol  and or any Recreational drug use.  Wear Seat belts while driving.  Do not drive if taking any narcotic, mind altering or controlled substances or recreational drugs or alcohol.       

## 2023-09-30 NOTE — Progress Notes (Signed)
 Patient discharged to [facility],report called and given to [facility] LPN.IV discontinued,catheter intact.[facility] to transport patient to awaiting facility.

## 2023-09-30 NOTE — Plan of Care (Signed)
  Problem: Health Behavior/Discharge Planning: Goal: Ability to manage health-related needs will improve Outcome: Progressing   Problem: Clinical Measurements: Goal: Ability to maintain clinical measurements within normal limits will improve Outcome: Progressing Goal: Will remain free from infection Outcome: Progressing Goal: Diagnostic test results will improve Outcome: Progressing   Problem: Activity: Goal: Risk for activity intolerance will decrease Outcome: Progressing   Problem: Elimination: Goal: Will not experience complications related to bowel motility Outcome: Progressing Goal: Will not experience complications related to urinary retention Outcome: Progressing   Problem: Safety: Goal: Ability to remain free from injury will improve Outcome: Progressing   Problem: Coping: Goal: Ability to adjust to condition or change in health will improve Outcome: Progressing   Problem: Health Behavior/Discharge Planning: Goal: Ability to manage health-related needs will improve Outcome: Progressing   Problem: Tissue Perfusion: Goal: Adequacy of tissue perfusion will improve Outcome: Progressing

## 2023-09-30 NOTE — Plan of Care (Signed)
  Problem: Safety: Goal: Ability to remain free from injury will improve Outcome: Progressing   Problem: Safety: Goal: Ability to remain free from injury will improve Outcome: Progressing

## 2023-09-30 NOTE — TOC Transition Note (Addendum)
 Transition of Care [facility]) - Discharge Note   Patient Details  Name: Gary Carroll MRN: [id number] Date of Birth: 1945-09-06  Transition of Care [facility]) CM/SW Contact:  Orelia Binet, RN Phone Number: 09/30/2023, 1:08 PM   Clinical Narrative:   [facility] for [facility] bed, they just keeping they will let us  know. They usually screen the new referrals and call in the morning. Patient has Auth with [facility] can admit today. Patient has been medically ready, CM at the bedside, patient is agreeable to discharge to [facility] today. Margretta Shi provided room number, RN calling report. TOC will schedule EMS.  [facility], [facility] are not offering [facility], they stated he need LTC medicaid. CM updated Margretta Shi.    Final next level of care: Skilled Nursing Facility Barriers to Discharge: Barriers Resolved   Patient Goals and CMS Choice Patient states their goals for this hospitalization and ongoing recovery are:: agreeable to SNF CMS Medicare.gov Compare Post Acute Care list provided to:: Patient       Discharge Placement               Patient to be transferred to facility by: EMS   Patient and family notified of of transfer: 09/30/23  Discharge Plan and Services Additional resources added to the After Visit Summary for         Social Drivers of Health (SDOH) Interventions SDOH Screenings   Food Insecurity: No Food Insecurity (09/23/2023)  Housing: Low Risk  (09/23/2023)  Transportation Needs: No Transportation Needs (09/23/2023)  Utilities: Not At Risk (09/23/2023)  Alcohol Screen: Low Risk  (02/12/2023)  Depression (PHQ2-9): Medium Risk (09/26/2023)  Financial Resource Strain: Low Risk  (09/26/2023)  Physical Activity: Inactive (09/26/2023)  Social Connections: Moderately Isolated (09/23/2023)  Stress: No Stress Concern Present (09/26/2023)  Tobacco Use: Medium Risk (09/24/2023)  Health Literacy: Adequate Health Literacy (03/07/2023)      Readmission Risk Interventions    09/24/2023    2:43 PM 02/27/2023   11:39 AM 02/07/2021    1:55 PM  Readmission Risk Prevention Plan  Transportation Screening Complete Complete Complete  PCP or Specialist Appt within 5-7 Days   Complete  PCP or Specialist Appt within 3-5 Days Not Complete    Home Care Screening   Complete  Medication Review (RN CM)   Referral to Pharmacy  HRI or Home Care Consult  Complete   Social Work Consult for Recovery Care Planning/Counseling Complete Complete   Palliative Care Screening Not Applicable Not Applicable   Medication Review Ocean[facility]) Complete Complete

## 2023-09-30 NOTE — Progress Notes (Signed)
 Physical Therapy Treatment Patient Details Name: Gary Carroll MRN: [id number] DOB: 1945-07-10 Today's Date: 09/30/2023   History of Present Illness Gary Carroll is a 78 y.o. male with medical history significant for severe AS status post TAVR, PAF on Eliquis , CAD, rectal adenocarcinoma, hypertension, dyslipidemia, type 2 diabetes, and chronic HFpEF who presented to the ED with weakness and inability to ambulate.  He was recently discharged to SNF on 4/8 with noted significant weakness at that time related to COVID-19 infection.  He was brought to ED via EMS who reported that his living conditions were quite bad and patient has been unable to ambulate well for a long period of time.  The call was made to EMS after he fell and he reported that he felt quite cold and could not get warm.    PT Comments  Patient demonstrates slight decline in functional mobility compared to last therapy session, unwilling to attempt sit to stand transfer and ambulation due to weakness of UEs and LEs.  Pt continues to demonstrate decreased LE strength, difficulty walking and impaired balance. Patient also demonstrates decreased endurance with bed mobility and bed side therapeutic exercises. Patient able to complete bed side therex with verbal cues, decreased performance noted with decreased ROM and increased speed. Patient would continue to benefit from skilled physical therapy for increased endurance with ambulation, increased LE strength, and improved balance for improved quality of life, improved independence with gait training and continued progress towards therapy goals.     If plan is discharge home, recommend the following: A lot of help with walking and/or transfers;A lot of help with bathing/dressing/bathroom;Assistance with cooking/housework;Assist for transportation;Help with stairs or ramp for entrance   Can travel by private vehicle     No  Equipment Recommendations  None recommended by PT     Recommendations for Other Services       Precautions / Restrictions Precautions Precautions: Fall Recall of Precautions/Restrictions: Intact Restrictions Weight Bearing Restrictions Per Provider Order: No     Mobility  Bed Mobility Overal bed mobility: Needs Assistance Bed Mobility: Supine to Sit Rolling: Max assist, Used rails   Supine to sit: Mod assist, Max assist     General bed mobility comments: increased time, labored movement    Transfers Overall transfer level:  (pt refused attempt to stand due to fatigue)                      Ambulation/Gait Ambulation/Gait assistance:  (pt unable to get out of bed today due to bilateral UE and LE weakness)                 Stairs             Wheelchair Mobility     Tilt Bed    Modified Rankin (Stroke Patients Only)       Balance Overall balance assessment: Needs assistance Sitting-balance support: Bilateral upper extremity supported Sitting balance-Leahy Scale: Fair Sitting balance - Comments: seated at EOB     Standing balance-Leahy Scale: Zero Standing balance comment: pt state he is too weak to stand today                            Communication Communication Communication: No apparent difficulties  Cognition Arousal: Alert Behavior During Therapy: WFL for tasks assessed/performed   PT - Cognitive impairments: No apparent impairments  Following commands: Intact      Cueing Cueing Techniques: Verbal cues, Tactile cues  Exercises General Exercises - Lower Extremity Ankle Circles/Pumps: AROM, Both, 15 reps Long Arc Quad: AROM, 15 reps, Both Hip Flexion/Marching: AROM, Both, 15 reps    General Comments        Pertinent Vitals/Pain Pain Assessment Pain Assessment: Faces Faces Pain Scale: Hurts little more Pain Location: gluteal region; pain in L and R flank as well Pain Descriptors / Indicators: Sore, Grimacing Pain  Intervention(s): Limited activity within patient's tolerance, Repositioned, Monitored during session    Home Living                          Prior Function            PT Goals (current goals can now be found in the care plan section) Acute Rehab PT Goals Patient Stated Goal: return home after rehab PT Goal Formulation: With patient Time For Goal Achievement: 09/30/23 Potential to Achieve Goals: Good Progress towards PT goals: Progressing toward goals    Frequency    Min 3X/week      PT Plan      Co-evaluation              AM-PAC PT "6 Clicks" Mobility   Outcome Measure  Help needed turning from your back to your side while in a flat bed without using bedrails?: A Lot Help needed moving from lying on your back to sitting on the side of a flat bed without using bedrails?: A Lot Help needed moving to and from a bed to a chair (including a wheelchair)?: A Lot Help needed standing up from a chair using your arms (e.g., wheelchair or bedside chair)?: A Lot Help needed to walk in hospital room?: A Lot Help needed climbing 3-5 steps with a railing? : Total 6 Click Score: 11    End of Session   Activity Tolerance: Patient limited by fatigue Patient left: in bed;with call bell/phone within reach Nurse Communication: Mobility status PT Visit Diagnosis: Muscle weakness (generalized) (M62.81);Other abnormalities of gait and mobility (R26.89);Unsteadiness on feet (R26.81)     Time: 0865-7846 PT Time Calculation (min) (ACUTE ONLY): 20 min  Charges:    $Therapeutic Exercise: 8-22 mins PT General Charges $$ ACUTE PT VISIT: 1 Visit                     Armond Bertin, PT, DPT [facility] Office: [phone] 11:33 AM, 09/30/23

## 2023-10-01 DIAGNOSIS — I350 Nonrheumatic aortic (valve) stenosis: Secondary | ICD-10-CM | POA: Diagnosis not present

## 2023-10-01 DIAGNOSIS — J449 Chronic obstructive pulmonary disease, unspecified: Secondary | ICD-10-CM | POA: Diagnosis not present

## 2023-10-01 DIAGNOSIS — E66811 Obesity, class 1: Secondary | ICD-10-CM | POA: Diagnosis not present

## 2023-10-01 DIAGNOSIS — I2510 Atherosclerotic heart disease of native coronary artery without angina pectoris: Secondary | ICD-10-CM | POA: Diagnosis not present

## 2023-10-01 DIAGNOSIS — E119 Type 2 diabetes mellitus without complications: Secondary | ICD-10-CM | POA: Diagnosis not present

## 2023-10-01 DIAGNOSIS — C20 Malignant neoplasm of rectum: Secondary | ICD-10-CM | POA: Diagnosis not present

## 2023-10-01 DIAGNOSIS — E782 Mixed hyperlipidemia: Secondary | ICD-10-CM | POA: Diagnosis not present

## 2023-10-01 DIAGNOSIS — N401 Enlarged prostate with lower urinary tract symptoms: Secondary | ICD-10-CM | POA: Diagnosis not present

## 2023-10-01 DIAGNOSIS — G4733 Obstructive sleep apnea (adult) (pediatric): Secondary | ICD-10-CM | POA: Diagnosis not present

## 2023-10-01 DIAGNOSIS — I480 Paroxysmal atrial fibrillation: Secondary | ICD-10-CM | POA: Diagnosis not present

## 2023-10-01 DIAGNOSIS — I10 Essential (primary) hypertension: Secondary | ICD-10-CM | POA: Diagnosis not present

## 2023-10-01 DIAGNOSIS — R350 Frequency of micturition: Secondary | ICD-10-CM | POA: Diagnosis not present

## 2023-10-10 ENCOUNTER — Other Ambulatory Visit: Payer: Self-pay

## 2023-10-10 ENCOUNTER — Other Ambulatory Visit: Payer: Self-pay | Admitting: *Deleted

## 2023-10-10 NOTE — Patient Outreach (Signed)
 Complex Care Management   Visit Note  10/10/2023  Name:  Gary Carroll MRN: [id number] DOB: February 12, 1946  Situation: Referral received for Complex Care Management related to s/ps TAVR replacement, referred from [facility] hospital Liaison I obtained verbal consent from Patient.  Visit completed with [name]  on the phone  Background:   Past Medical History:  Diagnosis Date   A-fib [facility])    Allergic rhinitis    Arthritis    Asthma    seasonal per pt   Bilateral knee pain    OA   CAD (coronary artery disease)    Diabetes mellitus without complication (HCC)    Diverticulosis 02/2012   seen on colonoscopy   Erectile dysfunction    GERD (gastroesophageal reflux disease)    Hiatal hernia    HLD (hyperlipidemia)    HTN (hypertension)    Hydronephrosis determined by ultrasound 08/22/2015   Overactive bladder    Prediabetes    Pulmonary lesion    Ringing of ears    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 Arlester Ladd and Dr. Sherene Dilling   Severe aortic stenosis    Sleep apnea    Urinary frequency    Urinary urgency    Wears glasses     Assessment: Patient Reported Symptoms:  Cognitive Cognitive Status: Alert and oriented to person, place, and time, Insightful and able to interpret abstract concepts, Normal speech and language skills Cognitive/Intellectual Conditions Management [RPT]: None reported or documented in medical history or problem list   Health Maintenance Behaviors: Annual physical exam, Sleep adequate Healing Pattern: Average Health Facilitated by: Rest  Neurological Neurological Review of Symptoms: Weakness Neurological Management Strategies: Adequate rest, Routine screening Neurological Self-Management Outcome: 3 (uncertain)  HEENT HEENT Symptoms Reported: No symptoms reported HEENT Conditions: Vision problem(s) HEENT Management Strategies: Adequate rest, Routine screening, Medical device HEENT  Self-Management Outcome: 4 (good) Vision problem(s)  Cardiovascular Cardiovascular Symptoms Reported: Fatigue Does patient have uncontrolled Hypertension?: No Cardiovascular Conditions: Coronary artery disease, Dysrhythmia, Heart failure, High blood cholesterol, Hypertension, Valvular disease Cardiovascular Management Strategies: Adequate rest, Coping strategies, Medical device, Medication therapy, Routine screening Cardiovascular Self-Management Outcome: 3 (uncertain)  Respiratory Respiratory Symptoms Reported: No symptoms reported Respiratory Conditions: Asthma, Seasonal allergies, Sleep disordered breathing Respiratory Self-Management Outcome: 4 (good)  Endocrine Patient reports the following symptoms related to hypoglycemia or hyperglycemia : Weakness or fatigue Is patient diabetic?: Yes Is patient checking blood sugars at home?: No ([place] Endocrine Conditions: Diabetes Endocrine Management Strategies: Adequate rest, Medication therapy, Routine screening Endocrine Self-Management Outcome: 4 (good)  Gastrointestinal Gastrointestinal Symptoms Reported: Diarrhea Gastrointestinal Conditions: Diarrhea, Constipation, Reflux/heartburn Other Gastrointestinal Conditions: hemorrhoids Gastrointestinal Management Strategies: Adequate rest, Coping strategies, Diet modification Gastrointestinal Self-Management Outcome: 3 (uncertain) Nutrition Risk Screen (CP): No indicators present  Genitourinary Genitourinary Symptoms Reported: Frequency, Urgency Genitourinary Conditions: Neurogenic bladder, Urinary tract infection, Frequency Genitourinary Management Strategies: Adequate rest, Incontinence garment/pad Genitourinary Self-Management Outcome: 3 (uncertain)  Integumentary Integumentary Symptoms Reported: No symptoms reported Skin Management Strategies: Adequate rest, Routine screening Skin Self-Management Outcome: 4 (good)  Musculoskeletal Musculoskelatal Symptoms Reviewed:  Weakness Musculoskeletal Conditions: Joint pain, Mobility limited, Osteoarthritis Musculoskeletal Management Strategies: Adequate rest, Routine screening, Medication therapy Musculoskeletal Self-Management Outcome: 4 (good) Falls in the past year?: Yes Number of falls in past year: 1 or less Was there an injury with Fall?: No Fall Risk Category Calculator: 1 Patient Fall Risk Level: Low Fall Risk Patient at Risk for Falls Due to:  History of fall(s), Impaired balance/gait, Impaired mobility, Impaired vision Fall risk Follow up: Falls evaluation completed  Psychosocial Psychosocial Symptoms Reported: No symptoms reported Behavioral Health Self-Management Outcome: 4 (good) Major Change/Loss/Stressor/Fears (CP): Medical condition, self, Resources Techniques to [place] with Loss/Stress/Change: Diversional activities Quality of Family Relationships: helpful, supportive Do you feel physically threatened by others?: No      10/10/2023    3:44 PM  Depression screen PHQ 2/9  Decreased Interest 1  Down, Depressed, Hopeless 1  PHQ - 2 Score 2  Difficult doing work/chores Not difficult at all    There were no vitals filed for this visit.  Medications Reviewed Today   Medications were not reviewed in this encounter     Recommendation:   01/06/24 [facility] Follow-up Encouraged continued participation in skilled nursing rehab  Follow Up Plan:   Telephone follow up appointment date/time:  11/25/23 3 pm   Judas Mohammad L. Mcarthur Speedy, RN, BSN, CCM [place]  [facility], [facility] RN Care Manager Direct Dial: [phone]  Fax: [phone]

## 2023-10-10 NOTE — Patient Instructions (Signed)
 Visit Information  Thank you for taking time to visit with me today. Please don't hesitate to contact me if I can be of assistance to you before our next scheduled appointment.  Your next care management appointment is by telephone on 11/25/23 at 3 pm  Continue to participate in your rehab therapy   Please call the care guide team at [phone] if you need to cancel, schedule, or reschedule an appointment.   Please call the Suicide and Crisis Lifeline: 988 call the [place]  National Suicide Prevention Lifeline: 947-590-3947 or TTY: 803-792-9002 TTY 9406634830) to talk to a trained counselor call 1-800-273-TALK (toll free, 24 hour hotline) call the [facility]: [phone] call 911 if you are experiencing a Mental Health or Behavioral Health Crisis or need someone to talk to.  Coty Larsh L. Mcarthur Speedy, RN, BSN, CCM [place]  [facility], [facility] RN Care Manager Direct Dial: [phone]  Fax: [phone]

## 2023-10-14 DIAGNOSIS — E782 Mixed hyperlipidemia: Secondary | ICD-10-CM | POA: Diagnosis not present

## 2023-10-14 DIAGNOSIS — I10 Essential (primary) hypertension: Secondary | ICD-10-CM | POA: Diagnosis not present

## 2023-10-14 DIAGNOSIS — E119 Type 2 diabetes mellitus without complications: Secondary | ICD-10-CM | POA: Diagnosis not present

## 2023-10-29 DIAGNOSIS — N401 Enlarged prostate with lower urinary tract symptoms: Secondary | ICD-10-CM | POA: Diagnosis not present

## 2023-10-29 DIAGNOSIS — J449 Chronic obstructive pulmonary disease, unspecified: Secondary | ICD-10-CM | POA: Diagnosis not present

## 2023-10-29 DIAGNOSIS — I2510 Atherosclerotic heart disease of native coronary artery without angina pectoris: Secondary | ICD-10-CM | POA: Diagnosis not present

## 2023-11-04 DIAGNOSIS — I350 Nonrheumatic aortic (valve) stenosis: Secondary | ICD-10-CM | POA: Diagnosis not present

## 2023-11-04 DIAGNOSIS — I480 Paroxysmal atrial fibrillation: Secondary | ICD-10-CM | POA: Diagnosis not present

## 2023-11-04 DIAGNOSIS — I5030 Unspecified diastolic (congestive) heart failure: Secondary | ICD-10-CM | POA: Diagnosis not present

## 2023-11-06 DIAGNOSIS — F4322 Adjustment disorder with anxiety: Secondary | ICD-10-CM | POA: Diagnosis not present

## 2023-11-13 DIAGNOSIS — D649 Anemia, unspecified: Secondary | ICD-10-CM | POA: Diagnosis not present

## 2023-11-22 DIAGNOSIS — I2510 Atherosclerotic heart disease of native coronary artery without angina pectoris: Secondary | ICD-10-CM | POA: Diagnosis not present

## 2023-11-22 DIAGNOSIS — N400 Enlarged prostate without lower urinary tract symptoms: Secondary | ICD-10-CM | POA: Diagnosis not present

## 2023-11-22 DIAGNOSIS — J9690 Respiratory failure, unspecified, unspecified whether with hypoxia or hypercapnia: Secondary | ICD-10-CM | POA: Diagnosis not present

## 2023-11-22 DIAGNOSIS — R944 Abnormal results of kidney function studies: Secondary | ICD-10-CM | POA: Diagnosis not present

## 2023-11-22 DIAGNOSIS — I959 Hypotension, unspecified: Secondary | ICD-10-CM | POA: Diagnosis not present

## 2023-11-22 DIAGNOSIS — E878 Other disorders of electrolyte and fluid balance, not elsewhere classified: Secondary | ICD-10-CM | POA: Diagnosis not present

## 2023-11-22 DIAGNOSIS — R6521 Severe sepsis with septic shock: Secondary | ICD-10-CM | POA: Diagnosis not present

## 2023-11-22 DIAGNOSIS — G4730 Sleep apnea, unspecified: Secondary | ICD-10-CM | POA: Diagnosis not present

## 2023-11-22 DIAGNOSIS — I110 Hypertensive heart disease with heart failure: Secondary | ICD-10-CM | POA: Diagnosis not present

## 2023-11-22 DIAGNOSIS — E870 Hyperosmolality and hypernatremia: Secondary | ICD-10-CM | POA: Diagnosis not present

## 2023-11-22 DIAGNOSIS — R0689 Other abnormalities of breathing: Secondary | ICD-10-CM | POA: Diagnosis not present

## 2023-11-22 DIAGNOSIS — I21A1 Myocardial infarction type 2: Secondary | ICD-10-CM | POA: Diagnosis not present

## 2023-11-22 DIAGNOSIS — E875 Hyperkalemia: Secondary | ICD-10-CM | POA: Diagnosis not present

## 2023-11-22 DIAGNOSIS — K7200 Acute and subacute hepatic failure without coma: Secondary | ICD-10-CM | POA: Diagnosis not present

## 2023-11-22 DIAGNOSIS — R748 Abnormal levels of other serum enzymes: Secondary | ICD-10-CM | POA: Diagnosis not present

## 2023-11-22 DIAGNOSIS — R7401 Elevation of levels of liver transaminase levels: Secondary | ICD-10-CM | POA: Diagnosis not present

## 2023-11-22 DIAGNOSIS — G9341 Metabolic encephalopathy: Secondary | ICD-10-CM | POA: Diagnosis not present

## 2023-11-22 DIAGNOSIS — Z743 Need for continuous supervision: Secondary | ICD-10-CM | POA: Diagnosis not present

## 2023-11-22 DIAGNOSIS — R7881 Bacteremia: Secondary | ICD-10-CM | POA: Diagnosis not present

## 2023-11-22 DIAGNOSIS — R0602 Shortness of breath: Secondary | ICD-10-CM | POA: Diagnosis not present

## 2023-11-22 DIAGNOSIS — R000 Tachycardia, unspecified: Secondary | ICD-10-CM | POA: Diagnosis not present

## 2023-11-22 DIAGNOSIS — G9340 Encephalopathy, unspecified: Secondary | ICD-10-CM | POA: Diagnosis not present

## 2023-11-22 DIAGNOSIS — N390 Urinary tract infection, site not specified: Secondary | ICD-10-CM | POA: Diagnosis not present

## 2023-11-22 DIAGNOSIS — R404 Transient alteration of awareness: Secondary | ICD-10-CM | POA: Diagnosis not present

## 2023-11-22 DIAGNOSIS — R911 Solitary pulmonary nodule: Secondary | ICD-10-CM | POA: Diagnosis not present

## 2023-11-22 DIAGNOSIS — D72829 Elevated white blood cell count, unspecified: Secondary | ICD-10-CM | POA: Diagnosis not present

## 2023-11-22 DIAGNOSIS — D696 Thrombocytopenia, unspecified: Secondary | ICD-10-CM | POA: Diagnosis not present

## 2023-11-22 DIAGNOSIS — Z7401 Bed confinement status: Secondary | ICD-10-CM | POA: Diagnosis not present

## 2023-11-22 DIAGNOSIS — R942 Abnormal results of pulmonary function studies: Secondary | ICD-10-CM | POA: Diagnosis not present

## 2023-11-22 DIAGNOSIS — R531 Weakness: Secondary | ICD-10-CM | POA: Diagnosis not present

## 2023-11-22 DIAGNOSIS — N179 Acute kidney failure, unspecified: Secondary | ICD-10-CM | POA: Diagnosis not present

## 2023-11-22 DIAGNOSIS — J9601 Acute respiratory failure with hypoxia: Secondary | ICD-10-CM | POA: Diagnosis not present

## 2023-11-22 DIAGNOSIS — R55 Syncope and collapse: Secondary | ICD-10-CM | POA: Diagnosis not present

## 2023-11-22 DIAGNOSIS — R079 Chest pain, unspecified: Secondary | ICD-10-CM | POA: Diagnosis not present

## 2023-11-22 DIAGNOSIS — A408 Other streptococcal sepsis: Secondary | ICD-10-CM | POA: Diagnosis not present

## 2023-11-22 DIAGNOSIS — J189 Pneumonia, unspecified organism: Secondary | ICD-10-CM | POA: Diagnosis not present

## 2023-11-22 DIAGNOSIS — Z4682 Encounter for fitting and adjustment of non-vascular catheter: Secondary | ICD-10-CM | POA: Diagnosis not present

## 2023-11-22 DIAGNOSIS — R579 Shock, unspecified: Secondary | ICD-10-CM | POA: Diagnosis not present

## 2023-11-22 DIAGNOSIS — L02211 Cutaneous abscess of abdominal wall: Secondary | ICD-10-CM | POA: Diagnosis not present

## 2023-11-22 DIAGNOSIS — A879 Viral meningitis, unspecified: Secondary | ICD-10-CM | POA: Diagnosis not present

## 2023-11-22 DIAGNOSIS — I509 Heart failure, unspecified: Secondary | ICD-10-CM | POA: Diagnosis not present

## 2023-11-22 DIAGNOSIS — E8770 Fluid overload, unspecified: Secondary | ICD-10-CM | POA: Diagnosis not present

## 2023-11-22 DIAGNOSIS — D649 Anemia, unspecified: Secondary | ICD-10-CM | POA: Diagnosis not present

## 2023-11-22 DIAGNOSIS — Z452 Encounter for adjustment and management of vascular access device: Secondary | ICD-10-CM | POA: Diagnosis not present

## 2023-11-22 DIAGNOSIS — A419 Sepsis, unspecified organism: Secondary | ICD-10-CM | POA: Diagnosis not present

## 2023-11-22 DIAGNOSIS — R578 Other shock: Secondary | ICD-10-CM | POA: Diagnosis not present

## 2023-11-22 DIAGNOSIS — J90 Pleural effusion, not elsewhere classified: Secondary | ICD-10-CM | POA: Diagnosis not present

## 2023-11-25 ENCOUNTER — Encounter: Payer: Self-pay | Admitting: *Deleted

## 2023-11-25 ENCOUNTER — Telehealth: Payer: Self-pay | Admitting: *Deleted

## 2023-12-01 ENCOUNTER — Telehealth: Payer: Self-pay

## 2023-12-01 DIAGNOSIS — J449 Chronic obstructive pulmonary disease, unspecified: Secondary | ICD-10-CM | POA: Diagnosis not present

## 2023-12-01 DIAGNOSIS — R350 Frequency of micturition: Secondary | ICD-10-CM | POA: Diagnosis not present

## 2023-12-01 DIAGNOSIS — E782 Mixed hyperlipidemia: Secondary | ICD-10-CM | POA: Diagnosis not present

## 2023-12-01 DIAGNOSIS — I2510 Atherosclerotic heart disease of native coronary artery without angina pectoris: Secondary | ICD-10-CM | POA: Diagnosis not present

## 2023-12-01 DIAGNOSIS — N401 Enlarged prostate with lower urinary tract symptoms: Secondary | ICD-10-CM | POA: Diagnosis not present

## 2023-12-01 DIAGNOSIS — R2681 Unsteadiness on feet: Secondary | ICD-10-CM | POA: Diagnosis not present

## 2023-12-01 DIAGNOSIS — E66811 Obesity, class 1: Secondary | ICD-10-CM | POA: Diagnosis not present

## 2023-12-01 DIAGNOSIS — M6281 Muscle weakness (generalized): Secondary | ICD-10-CM | POA: Diagnosis not present

## 2023-12-01 DIAGNOSIS — I480 Paroxysmal atrial fibrillation: Secondary | ICD-10-CM | POA: Diagnosis not present

## 2023-12-01 DIAGNOSIS — I10 Essential (primary) hypertension: Secondary | ICD-10-CM | POA: Diagnosis not present

## 2023-12-01 DIAGNOSIS — I350 Nonrheumatic aortic (valve) stenosis: Secondary | ICD-10-CM | POA: Diagnosis not present

## 2023-12-01 DIAGNOSIS — I5030 Unspecified diastolic (congestive) heart failure: Secondary | ICD-10-CM | POA: Diagnosis not present

## 2023-12-01 DIAGNOSIS — R262 Difficulty in walking, not elsewhere classified: Secondary | ICD-10-CM | POA: Diagnosis not present

## 2023-12-01 DIAGNOSIS — C20 Malignant neoplasm of rectum: Secondary | ICD-10-CM | POA: Diagnosis not present

## 2023-12-01 DIAGNOSIS — E119 Type 2 diabetes mellitus without complications: Secondary | ICD-10-CM | POA: Diagnosis not present

## 2023-12-01 DIAGNOSIS — N390 Urinary tract infection, site not specified: Secondary | ICD-10-CM | POA: Diagnosis not present

## 2023-12-01 NOTE — Progress Notes (Unsigned)
 Complex Care Management Care Guide Note  12/01/2023 Name: Gary Carroll MRN: [id number] DOB: January 30, 1946  Gary Carroll is a 78 y.o. year old male who is a primary care patient of [facility] and is actively engaged with the care management team. I reached out to Gary CHRISTELLA Dale by phone today to assist with re-scheduling  with the RN Case Manager.  Follow up plan: Unsuccessful telephone outreach attempt made. A HIPAA compliant phone message was left for the patient providing contact information and requesting a return call.  Leotis Rase [facility], [facility] Guide  Direct Dial: [phone]  Fax [phone]

## 2023-12-02 DIAGNOSIS — M6281 Muscle weakness (generalized): Secondary | ICD-10-CM | POA: Diagnosis not present

## 2023-12-02 DIAGNOSIS — N390 Urinary tract infection, site not specified: Secondary | ICD-10-CM | POA: Diagnosis not present

## 2023-12-02 DIAGNOSIS — J449 Chronic obstructive pulmonary disease, unspecified: Secondary | ICD-10-CM | POA: Diagnosis not present

## 2023-12-02 DIAGNOSIS — R2681 Unsteadiness on feet: Secondary | ICD-10-CM | POA: Diagnosis not present

## 2023-12-02 DIAGNOSIS — R262 Difficulty in walking, not elsewhere classified: Secondary | ICD-10-CM | POA: Diagnosis not present

## 2023-12-02 NOTE — Progress Notes (Signed)
 Complex Care Management Care Guide Note  12/02/2023 Name: Gary Carroll MRN: [id number] DOB: Jun 26, 1945  Gary Carroll is a 78 y.o. year old male who is a primary care patient of [facility] and is actively engaged with the care management team. I reached out to Gary Carroll by phone today to assist with re-scheduling  with the RN Case Manager.  Follow up plan: Unsuccessful telephone outreach attempt made. A HIPAA compliant phone message was left for the patient providing contact information and requesting a return call.  Leotis Rase [facility], [facility] Guide  Direct Dial: [phone]  Fax [phone]

## 2023-12-03 DIAGNOSIS — I5030 Unspecified diastolic (congestive) heart failure: Secondary | ICD-10-CM | POA: Diagnosis not present

## 2023-12-03 DIAGNOSIS — J449 Chronic obstructive pulmonary disease, unspecified: Secondary | ICD-10-CM | POA: Diagnosis not present

## 2023-12-03 DIAGNOSIS — M6281 Muscle weakness (generalized): Secondary | ICD-10-CM | POA: Diagnosis not present

## 2023-12-03 DIAGNOSIS — E119 Type 2 diabetes mellitus without complications: Secondary | ICD-10-CM | POA: Diagnosis not present

## 2023-12-03 DIAGNOSIS — L24A2 Irritant contact dermatitis due to fecal, urinary or dual incontinence: Secondary | ICD-10-CM | POA: Diagnosis not present

## 2023-12-03 DIAGNOSIS — N390 Urinary tract infection, site not specified: Secondary | ICD-10-CM | POA: Diagnosis not present

## 2023-12-03 DIAGNOSIS — R2681 Unsteadiness on feet: Secondary | ICD-10-CM | POA: Diagnosis not present

## 2023-12-03 DIAGNOSIS — R262 Difficulty in walking, not elsewhere classified: Secondary | ICD-10-CM | POA: Diagnosis not present

## 2023-12-03 DIAGNOSIS — L89313 Pressure ulcer of right buttock, stage 3: Secondary | ICD-10-CM | POA: Diagnosis not present

## 2023-12-04 DIAGNOSIS — M6281 Muscle weakness (generalized): Secondary | ICD-10-CM | POA: Diagnosis not present

## 2023-12-04 DIAGNOSIS — J449 Chronic obstructive pulmonary disease, unspecified: Secondary | ICD-10-CM | POA: Diagnosis not present

## 2023-12-04 DIAGNOSIS — E119 Type 2 diabetes mellitus without complications: Secondary | ICD-10-CM | POA: Diagnosis not present

## 2023-12-04 DIAGNOSIS — R2681 Unsteadiness on feet: Secondary | ICD-10-CM | POA: Diagnosis not present

## 2023-12-04 DIAGNOSIS — N390 Urinary tract infection, site not specified: Secondary | ICD-10-CM | POA: Diagnosis not present

## 2023-12-04 DIAGNOSIS — R262 Difficulty in walking, not elsewhere classified: Secondary | ICD-10-CM | POA: Diagnosis not present

## 2023-12-05 DIAGNOSIS — R262 Difficulty in walking, not elsewhere classified: Secondary | ICD-10-CM | POA: Diagnosis not present

## 2023-12-05 DIAGNOSIS — N390 Urinary tract infection, site not specified: Secondary | ICD-10-CM | POA: Diagnosis not present

## 2023-12-05 DIAGNOSIS — R2681 Unsteadiness on feet: Secondary | ICD-10-CM | POA: Diagnosis not present

## 2023-12-05 DIAGNOSIS — J449 Chronic obstructive pulmonary disease, unspecified: Secondary | ICD-10-CM | POA: Diagnosis not present

## 2023-12-05 DIAGNOSIS — M6281 Muscle weakness (generalized): Secondary | ICD-10-CM | POA: Diagnosis not present

## 2023-12-08 DIAGNOSIS — J449 Chronic obstructive pulmonary disease, unspecified: Secondary | ICD-10-CM | POA: Diagnosis not present

## 2023-12-08 DIAGNOSIS — R262 Difficulty in walking, not elsewhere classified: Secondary | ICD-10-CM | POA: Diagnosis not present

## 2023-12-08 DIAGNOSIS — N390 Urinary tract infection, site not specified: Secondary | ICD-10-CM | POA: Diagnosis not present

## 2023-12-08 DIAGNOSIS — R2681 Unsteadiness on feet: Secondary | ICD-10-CM | POA: Diagnosis not present

## 2023-12-08 DIAGNOSIS — M6281 Muscle weakness (generalized): Secondary | ICD-10-CM | POA: Diagnosis not present

## 2023-12-10 DIAGNOSIS — I5030 Unspecified diastolic (congestive) heart failure: Secondary | ICD-10-CM | POA: Diagnosis not present

## 2023-12-10 DIAGNOSIS — L24A2 Irritant contact dermatitis due to fecal, urinary or dual incontinence: Secondary | ICD-10-CM | POA: Diagnosis not present

## 2023-12-10 DIAGNOSIS — N390 Urinary tract infection, site not specified: Secondary | ICD-10-CM | POA: Diagnosis not present

## 2023-12-10 DIAGNOSIS — R262 Difficulty in walking, not elsewhere classified: Secondary | ICD-10-CM | POA: Diagnosis not present

## 2023-12-10 DIAGNOSIS — J449 Chronic obstructive pulmonary disease, unspecified: Secondary | ICD-10-CM | POA: Diagnosis not present

## 2023-12-10 DIAGNOSIS — L89153 Pressure ulcer of sacral region, stage 3: Secondary | ICD-10-CM | POA: Diagnosis not present

## 2023-12-10 DIAGNOSIS — R2681 Unsteadiness on feet: Secondary | ICD-10-CM | POA: Diagnosis not present

## 2023-12-10 DIAGNOSIS — M6281 Muscle weakness (generalized): Secondary | ICD-10-CM | POA: Diagnosis not present

## 2023-12-11 DIAGNOSIS — N390 Urinary tract infection, site not specified: Secondary | ICD-10-CM | POA: Diagnosis not present

## 2023-12-11 DIAGNOSIS — R319 Hematuria, unspecified: Secondary | ICD-10-CM | POA: Diagnosis not present

## 2023-12-11 DIAGNOSIS — J449 Chronic obstructive pulmonary disease, unspecified: Secondary | ICD-10-CM | POA: Diagnosis not present

## 2023-12-11 DIAGNOSIS — R2681 Unsteadiness on feet: Secondary | ICD-10-CM | POA: Diagnosis not present

## 2023-12-11 DIAGNOSIS — R262 Difficulty in walking, not elsewhere classified: Secondary | ICD-10-CM | POA: Diagnosis not present

## 2023-12-11 DIAGNOSIS — F4322 Adjustment disorder with anxiety: Secondary | ICD-10-CM | POA: Diagnosis not present

## 2023-12-11 DIAGNOSIS — M6281 Muscle weakness (generalized): Secondary | ICD-10-CM | POA: Diagnosis not present

## 2023-12-12 DIAGNOSIS — R2681 Unsteadiness on feet: Secondary | ICD-10-CM | POA: Diagnosis not present

## 2023-12-12 DIAGNOSIS — R262 Difficulty in walking, not elsewhere classified: Secondary | ICD-10-CM | POA: Diagnosis not present

## 2023-12-12 DIAGNOSIS — M6281 Muscle weakness (generalized): Secondary | ICD-10-CM | POA: Diagnosis not present

## 2023-12-12 DIAGNOSIS — J449 Chronic obstructive pulmonary disease, unspecified: Secondary | ICD-10-CM | POA: Diagnosis not present

## 2023-12-12 DIAGNOSIS — N390 Urinary tract infection, site not specified: Secondary | ICD-10-CM | POA: Diagnosis not present

## 2023-12-13 DIAGNOSIS — M6281 Muscle weakness (generalized): Secondary | ICD-10-CM | POA: Diagnosis not present

## 2023-12-13 DIAGNOSIS — J449 Chronic obstructive pulmonary disease, unspecified: Secondary | ICD-10-CM | POA: Diagnosis not present

## 2023-12-13 DIAGNOSIS — R2681 Unsteadiness on feet: Secondary | ICD-10-CM | POA: Diagnosis not present

## 2023-12-13 DIAGNOSIS — N390 Urinary tract infection, site not specified: Secondary | ICD-10-CM | POA: Diagnosis not present

## 2023-12-13 DIAGNOSIS — R262 Difficulty in walking, not elsewhere classified: Secondary | ICD-10-CM | POA: Diagnosis not present

## 2023-12-15 DIAGNOSIS — J449 Chronic obstructive pulmonary disease, unspecified: Secondary | ICD-10-CM | POA: Diagnosis not present

## 2023-12-15 DIAGNOSIS — M6281 Muscle weakness (generalized): Secondary | ICD-10-CM | POA: Diagnosis not present

## 2023-12-15 DIAGNOSIS — N390 Urinary tract infection, site not specified: Secondary | ICD-10-CM | POA: Diagnosis not present

## 2023-12-15 DIAGNOSIS — R262 Difficulty in walking, not elsewhere classified: Secondary | ICD-10-CM | POA: Diagnosis not present

## 2023-12-15 DIAGNOSIS — R2681 Unsteadiness on feet: Secondary | ICD-10-CM | POA: Diagnosis not present

## 2023-12-16 DIAGNOSIS — I2510 Atherosclerotic heart disease of native coronary artery without angina pectoris: Secondary | ICD-10-CM | POA: Diagnosis not present

## 2023-12-16 DIAGNOSIS — R262 Difficulty in walking, not elsewhere classified: Secondary | ICD-10-CM | POA: Diagnosis not present

## 2023-12-16 DIAGNOSIS — J449 Chronic obstructive pulmonary disease, unspecified: Secondary | ICD-10-CM | POA: Diagnosis not present

## 2023-12-16 DIAGNOSIS — M6281 Muscle weakness (generalized): Secondary | ICD-10-CM | POA: Diagnosis not present

## 2023-12-16 DIAGNOSIS — N390 Urinary tract infection, site not specified: Secondary | ICD-10-CM | POA: Diagnosis not present

## 2023-12-16 DIAGNOSIS — N401 Enlarged prostate with lower urinary tract symptoms: Secondary | ICD-10-CM | POA: Diagnosis not present

## 2023-12-16 DIAGNOSIS — R2681 Unsteadiness on feet: Secondary | ICD-10-CM | POA: Diagnosis not present

## 2023-12-17 DIAGNOSIS — L89153 Pressure ulcer of sacral region, stage 3: Secondary | ICD-10-CM | POA: Diagnosis not present

## 2023-12-17 DIAGNOSIS — I5030 Unspecified diastolic (congestive) heart failure: Secondary | ICD-10-CM | POA: Diagnosis not present

## 2023-12-17 DIAGNOSIS — R262 Difficulty in walking, not elsewhere classified: Secondary | ICD-10-CM | POA: Diagnosis not present

## 2023-12-17 DIAGNOSIS — M6281 Muscle weakness (generalized): Secondary | ICD-10-CM | POA: Diagnosis not present

## 2023-12-17 DIAGNOSIS — E119 Type 2 diabetes mellitus without complications: Secondary | ICD-10-CM | POA: Diagnosis not present

## 2023-12-17 DIAGNOSIS — N390 Urinary tract infection, site not specified: Secondary | ICD-10-CM | POA: Diagnosis not present

## 2023-12-17 DIAGNOSIS — R2681 Unsteadiness on feet: Secondary | ICD-10-CM | POA: Diagnosis not present

## 2023-12-17 DIAGNOSIS — J449 Chronic obstructive pulmonary disease, unspecified: Secondary | ICD-10-CM | POA: Diagnosis not present

## 2023-12-18 DIAGNOSIS — N390 Urinary tract infection, site not specified: Secondary | ICD-10-CM | POA: Diagnosis not present

## 2023-12-18 DIAGNOSIS — R2681 Unsteadiness on feet: Secondary | ICD-10-CM | POA: Diagnosis not present

## 2023-12-18 DIAGNOSIS — F4322 Adjustment disorder with anxiety: Secondary | ICD-10-CM | POA: Diagnosis not present

## 2023-12-18 DIAGNOSIS — R262 Difficulty in walking, not elsewhere classified: Secondary | ICD-10-CM | POA: Diagnosis not present

## 2023-12-18 DIAGNOSIS — J449 Chronic obstructive pulmonary disease, unspecified: Secondary | ICD-10-CM | POA: Diagnosis not present

## 2023-12-18 DIAGNOSIS — M6281 Muscle weakness (generalized): Secondary | ICD-10-CM | POA: Diagnosis not present

## 2023-12-28 ENCOUNTER — Inpatient Hospital Stay (HOSPITAL_COMMUNITY)
Admission: EM | Admit: 2023-12-28 | Discharge: 2024-01-07 | DRG: 871 | Disposition: A | Discharge status: Skilled Nursing Facility | Source: Skilled Nursing Facility | Attending: Internal Medicine | Admitting: Internal Medicine

## 2023-12-28 ENCOUNTER — Other Ambulatory Visit: Payer: Self-pay

## 2023-12-28 ENCOUNTER — Emergency Department (HOSPITAL_COMMUNITY)

## 2023-12-28 DIAGNOSIS — R7401 Elevation of levels of liver transaminase levels: Secondary | ICD-10-CM | POA: Diagnosis not present

## 2023-12-28 DIAGNOSIS — Z7901 Long term (current) use of anticoagulants: Secondary | ICD-10-CM

## 2023-12-28 DIAGNOSIS — N179 Acute kidney failure, unspecified: Secondary | ICD-10-CM | POA: Diagnosis present

## 2023-12-28 DIAGNOSIS — E872 Acidosis, unspecified: Secondary | ICD-10-CM | POA: Diagnosis present

## 2023-12-28 DIAGNOSIS — I480 Paroxysmal atrial fibrillation: Secondary | ICD-10-CM | POA: Diagnosis present

## 2023-12-28 DIAGNOSIS — M1990 Unspecified osteoarthritis, unspecified site: Secondary | ICD-10-CM | POA: Diagnosis present

## 2023-12-28 DIAGNOSIS — Z96653 Presence of artificial knee joint, bilateral: Secondary | ICD-10-CM | POA: Diagnosis not present

## 2023-12-28 DIAGNOSIS — J189 Pneumonia, unspecified organism: Secondary | ICD-10-CM | POA: Diagnosis present

## 2023-12-28 DIAGNOSIS — R6520 Severe sepsis without septic shock: Secondary | ICD-10-CM | POA: Diagnosis not present

## 2023-12-28 DIAGNOSIS — L97915 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 with muscle involvement without evidence of necrosis: Secondary | ICD-10-CM | POA: Diagnosis not present

## 2023-12-28 DIAGNOSIS — L89313 Pressure ulcer of right buttock, stage 3: Secondary | ICD-10-CM | POA: Diagnosis present

## 2023-12-28 DIAGNOSIS — I10 Essential (primary) hypertension: Secondary | ICD-10-CM | POA: Diagnosis present

## 2023-12-28 DIAGNOSIS — Z9981 Dependence on supplemental oxygen: Secondary | ICD-10-CM

## 2023-12-28 DIAGNOSIS — I2510 Atherosclerotic heart disease of native coronary artery without angina pectoris: Secondary | ICD-10-CM | POA: Diagnosis not present

## 2023-12-28 DIAGNOSIS — Z85048 Personal history of other malignant neoplasm of rectum, rectosigmoid junction, and anus: Secondary | ICD-10-CM

## 2023-12-28 DIAGNOSIS — Z888 Allergy status to other drugs, medicaments and biological substances status: Secondary | ICD-10-CM

## 2023-12-28 DIAGNOSIS — E118 Type 2 diabetes mellitus with unspecified complications: Secondary | ICD-10-CM | POA: Diagnosis present

## 2023-12-28 DIAGNOSIS — Z1152 Encounter for screening for COVID-19: Secondary | ICD-10-CM

## 2023-12-28 DIAGNOSIS — K629 Disease of anus and rectum, unspecified: Secondary | ICD-10-CM | POA: Diagnosis not present

## 2023-12-28 DIAGNOSIS — J440 Chronic obstructive pulmonary disease with acute lower respiratory infection: Secondary | ICD-10-CM | POA: Diagnosis present

## 2023-12-28 DIAGNOSIS — I5030 Unspecified diastolic (congestive) heart failure: Secondary | ICD-10-CM | POA: Diagnosis present

## 2023-12-28 DIAGNOSIS — N390 Urinary tract infection, site not specified: Secondary | ICD-10-CM | POA: Diagnosis present

## 2023-12-28 DIAGNOSIS — I350 Nonrheumatic aortic (valve) stenosis: Secondary | ICD-10-CM

## 2023-12-28 DIAGNOSIS — I5032 Chronic diastolic (congestive) heart failure: Secondary | ICD-10-CM | POA: Diagnosis present

## 2023-12-28 DIAGNOSIS — R6521 Severe sepsis with septic shock: Secondary | ICD-10-CM | POA: Diagnosis present

## 2023-12-28 DIAGNOSIS — D649 Anemia, unspecified: Secondary | ICD-10-CM | POA: Diagnosis present

## 2023-12-28 DIAGNOSIS — Z79899 Other long term (current) drug therapy: Secondary | ICD-10-CM

## 2023-12-28 DIAGNOSIS — N401 Enlarged prostate with lower urinary tract symptoms: Secondary | ICD-10-CM | POA: Diagnosis present

## 2023-12-28 DIAGNOSIS — B955 Unspecified streptococcus as the cause of diseases classified elsewhere: Secondary | ICD-10-CM | POA: Diagnosis not present

## 2023-12-28 DIAGNOSIS — Z515 Encounter for palliative care: Secondary | ICD-10-CM | POA: Diagnosis not present

## 2023-12-28 DIAGNOSIS — I959 Hypotension, unspecified: Secondary | ICD-10-CM | POA: Diagnosis present

## 2023-12-28 DIAGNOSIS — L97519 Non-pressure chronic ulcer of other part of right foot with unspecified severity: Secondary | ICD-10-CM | POA: Diagnosis not present

## 2023-12-28 DIAGNOSIS — Z952 Presence of prosthetic heart valve: Secondary | ICD-10-CM

## 2023-12-28 DIAGNOSIS — E119 Type 2 diabetes mellitus without complications: Secondary | ICD-10-CM | POA: Diagnosis present

## 2023-12-28 DIAGNOSIS — Z8616 Personal history of COVID-19: Secondary | ICD-10-CM | POA: Diagnosis not present

## 2023-12-28 DIAGNOSIS — E861 Hypovolemia: Secondary | ICD-10-CM | POA: Diagnosis present

## 2023-12-28 DIAGNOSIS — I110 Hypertensive heart disease with heart failure: Secondary | ICD-10-CM | POA: Diagnosis present

## 2023-12-28 DIAGNOSIS — Z923 Personal history of irradiation: Secondary | ICD-10-CM

## 2023-12-28 DIAGNOSIS — E875 Hyperkalemia: Secondary | ICD-10-CM | POA: Diagnosis present

## 2023-12-28 DIAGNOSIS — N320 Bladder-neck obstruction: Secondary | ICD-10-CM

## 2023-12-28 DIAGNOSIS — R339 Retention of urine, unspecified: Secondary | ICD-10-CM

## 2023-12-28 DIAGNOSIS — A408 Other streptococcal sepsis: Secondary | ICD-10-CM | POA: Diagnosis present

## 2023-12-28 DIAGNOSIS — R7881 Bacteremia: Secondary | ICD-10-CM | POA: Diagnosis not present

## 2023-12-28 DIAGNOSIS — K219 Gastro-esophageal reflux disease without esophagitis: Secondary | ICD-10-CM | POA: Diagnosis present

## 2023-12-28 DIAGNOSIS — I872 Venous insufficiency (chronic) (peripheral): Secondary | ICD-10-CM | POA: Diagnosis not present

## 2023-12-28 DIAGNOSIS — K601 Chronic anal fissure: Secondary | ICD-10-CM | POA: Diagnosis not present

## 2023-12-28 DIAGNOSIS — R3914 Feeling of incomplete bladder emptying: Secondary | ICD-10-CM | POA: Diagnosis not present

## 2023-12-28 DIAGNOSIS — Z87891 Personal history of nicotine dependence: Secondary | ICD-10-CM

## 2023-12-28 DIAGNOSIS — K602 Anal fissure, unspecified: Secondary | ICD-10-CM | POA: Diagnosis present

## 2023-12-28 DIAGNOSIS — I361 Nonrheumatic tricuspid (valve) insufficiency: Secondary | ICD-10-CM | POA: Diagnosis not present

## 2023-12-28 DIAGNOSIS — G4730 Sleep apnea, unspecified: Secondary | ICD-10-CM | POA: Diagnosis present

## 2023-12-28 DIAGNOSIS — Z7189 Other specified counseling: Secondary | ICD-10-CM | POA: Diagnosis not present

## 2023-12-28 DIAGNOSIS — L89323 Pressure ulcer of left buttock, stage 3: Secondary | ICD-10-CM | POA: Diagnosis present

## 2023-12-28 DIAGNOSIS — Z9221 Personal history of antineoplastic chemotherapy: Secondary | ICD-10-CM

## 2023-12-28 DIAGNOSIS — Z9049 Acquired absence of other specified parts of digestive tract: Secondary | ICD-10-CM

## 2023-12-28 DIAGNOSIS — Z87440 Personal history of urinary (tract) infections: Secondary | ICD-10-CM

## 2023-12-28 DIAGNOSIS — Z7984 Long term (current) use of oral hypoglycemic drugs: Secondary | ICD-10-CM

## 2023-12-28 DIAGNOSIS — I359 Nonrheumatic aortic valve disorder, unspecified: Secondary | ICD-10-CM | POA: Diagnosis present

## 2023-12-28 DIAGNOSIS — L97919 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 with unspecified severity: Secondary | ICD-10-CM | POA: Diagnosis present

## 2023-12-28 DIAGNOSIS — Z6834 Body mass index (BMI) 34.0-34.9, adult: Secondary | ICD-10-CM

## 2023-12-28 DIAGNOSIS — A419 Sepsis, unspecified organism: Secondary | ICD-10-CM | POA: Diagnosis not present

## 2023-12-28 DIAGNOSIS — L89153 Pressure ulcer of sacral region, stage 3: Secondary | ICD-10-CM | POA: Diagnosis present

## 2023-12-28 DIAGNOSIS — K449 Diaphragmatic hernia without obstruction or gangrene: Secondary | ICD-10-CM | POA: Diagnosis present

## 2023-12-28 DIAGNOSIS — R509 Fever, unspecified: Secondary | ICD-10-CM | POA: Diagnosis present

## 2023-12-28 DIAGNOSIS — G928 Other toxic encephalopathy: Secondary | ICD-10-CM | POA: Diagnosis present

## 2023-12-28 DIAGNOSIS — A409 Streptococcal sepsis, unspecified: Secondary | ICD-10-CM | POA: Diagnosis not present

## 2023-12-28 DIAGNOSIS — E785 Hyperlipidemia, unspecified: Secondary | ICD-10-CM | POA: Diagnosis present

## 2023-12-28 DIAGNOSIS — G4700 Insomnia, unspecified: Secondary | ICD-10-CM | POA: Diagnosis present

## 2023-12-28 DIAGNOSIS — A491 Streptococcal infection, unspecified site: Secondary | ICD-10-CM | POA: Diagnosis not present

## 2023-12-28 DIAGNOSIS — E876 Hypokalemia: Secondary | ICD-10-CM | POA: Diagnosis present

## 2023-12-28 DIAGNOSIS — B954 Other streptococcus as the cause of diseases classified elsewhere: Secondary | ICD-10-CM | POA: Diagnosis not present

## 2023-12-28 DIAGNOSIS — F4321 Adjustment disorder with depressed mood: Secondary | ICD-10-CM | POA: Diagnosis present

## 2023-12-28 DIAGNOSIS — Z8049 Family history of malignant neoplasm of other genital organs: Secondary | ICD-10-CM

## 2023-12-28 DIAGNOSIS — Z8249 Family history of ischemic heart disease and other diseases of the circulatory system: Secondary | ICD-10-CM

## 2023-12-28 LAB — PROTIME-INR
INR: 2.4 — ABNORMAL HIGH (ref 0.8–1.2)
Prothrombin Time: 26.9 s — ABNORMAL HIGH (ref 11.4–15.2)

## 2023-12-28 LAB — COMPREHENSIVE METABOLIC PANEL WITH GFR
ALT: 47 U/L — ABNORMAL HIGH (ref 0–44)
ALT: 33 U/L (ref 0–44)
AST: 63 U/L — ABNORMAL HIGH (ref 15–41)
AST: 46 U/L — ABNORMAL HIGH (ref 15–41)
Albumin: 2.6 g/dL — ABNORMAL LOW (ref 3.5–5.0)
Albumin: 1.8 g/dL — ABNORMAL LOW (ref 3.5–5.0)
Alkaline Phosphatase: 123 U/L (ref 38–126)
Alkaline Phosphatase: 84 U/L (ref 38–126)
Anion gap: 15 (ref 5–15)
Anion gap: 11 (ref 5–15)
BUN: 28 mg/dL — ABNORMAL HIGH (ref 8–23)
BUN: 25 mg/dL — ABNORMAL HIGH (ref 8–23)
CO2: 19 mmol/L — ABNORMAL LOW (ref 22–32)
CO2: 18 mmol/L — ABNORMAL LOW (ref 22–32)
Calcium: 8.8 mg/dL — ABNORMAL LOW (ref 8.9–10.3)
Calcium: 7.6 mg/dL — ABNORMAL LOW (ref 8.9–10.3)
Chloride: 102 mmol/L (ref 98–111)
Chloride: 106 mmol/L (ref 98–111)
Creatinine, Ser: 1.54 mg/dL — ABNORMAL HIGH (ref 0.61–1.24)
Creatinine, Ser: 0.83 mg/dL (ref 0.61–1.24)
GFR, Estimated: 46 mL/min — ABNORMAL LOW (ref >60)
GFR, Estimated: >60 mL/min (ref >60)
Glucose, Bld: 95 mg/dL (ref 70–99)
Glucose, Bld: 115 mg/dL — ABNORMAL HIGH (ref 70–99)
Potassium: 5.3 mmol/L — ABNORMAL HIGH (ref 3.5–5.1)
Potassium: 4.4 mmol/L (ref 3.5–5.1)
Sodium: 136 mmol/L (ref 135–145)
Sodium: 135 mmol/L (ref 135–145)
Total Bilirubin: 0.9 mg/dL (ref 0.0–1.2)
Total Bilirubin: 0.8 mg/dL (ref 0.0–1.2)
Total Protein: 6.7 g/dL (ref 6.5–8.1)
Total Protein: 4.9 g/dL — ABNORMAL LOW (ref 6.5–8.1)

## 2023-12-28 LAB — RESP PANEL BY RT-PCR (RSV, FLU A&B, COVID)  RVPGX2
Influenza A by PCR: NEGATIVE
Influenza B by PCR: NEGATIVE
Resp Syncytial Virus by PCR: NEGATIVE
SARS Coronavirus 2 by RT PCR: NEGATIVE

## 2023-12-28 LAB — URINALYSIS, W/ REFLEX TO CULTURE (INFECTION SUSPECTED)
Bacteria, UA: NONE SEEN
Bilirubin Urine: NEGATIVE
Glucose, UA: NEGATIVE mg/dL
Ketones, ur: NEGATIVE mg/dL
Nitrite: NEGATIVE
Protein, ur: 30 mg/dL — AB
Specific Gravity, Urine: 1.014 (ref 1.005–1.030)
WBC, UA: >50 WBC/hpf (ref 0–5)
pH: 7 (ref 5.0–8.0)

## 2023-12-28 LAB — CBC
HCT: 29.7 % — ABNORMAL LOW (ref 39.0–52.0)
Hemoglobin: 9.2 g/dL — ABNORMAL LOW (ref 13.0–17.0)
MCH: 29.8 pg (ref 26.0–34.0)
MCHC: 31 g/dL (ref 30.0–36.0)
MCV: 96.1 fL (ref 80.0–100.0)
Platelets: 110 K/uL — ABNORMAL LOW (ref 150–400)
RBC: 3.09 MIL/uL — ABNORMAL LOW (ref 4.22–5.81)
RDW: 16.9 % — ABNORMAL HIGH (ref 11.5–15.5)
WBC: 22.2 K/uL — ABNORMAL HIGH (ref 4.0–10.5)
nRBC: 0 % (ref 0.0–0.2)

## 2023-12-28 LAB — PHOSPHORUS: Phosphorus: 4.5 mg/dL (ref 2.5–4.6)

## 2023-12-28 LAB — MRSA NEXT GEN BY PCR, NASAL: MRSA by PCR Next Gen: DETECTED — AB

## 2023-12-28 LAB — CBC WITH DIFFERENTIAL/PLATELET
Abs Immature Granulocytes: 0.06 K/uL (ref 0.00–0.07)
Basophils Absolute: 0.1 K/uL (ref 0.0–0.1)
Basophils Relative: 1 %
Eosinophils Absolute: 0 K/uL (ref 0.0–0.5)
Eosinophils Relative: 0 %
HCT: 38.3 % — ABNORMAL LOW (ref 39.0–52.0)
Hemoglobin: 11.9 g/dL — ABNORMAL LOW (ref 13.0–17.0)
Immature Granulocytes: 0 %
Lymphocytes Relative: 1 %
Lymphs Abs: 0.1 K/uL — ABNORMAL LOW (ref 0.7–4.0)
MCH: 29.6 pg (ref 26.0–34.0)
MCHC: 31.1 g/dL (ref 30.0–36.0)
MCV: 95.3 fL (ref 80.0–100.0)
Monocytes Absolute: 0.1 K/uL (ref 0.1–1.0)
Monocytes Relative: 1 %
Neutro Abs: 15 K/uL — ABNORMAL HIGH (ref 1.7–7.7)
Neutrophils Relative %: 97 %
Platelets: 159 K/uL (ref 150–400)
RBC: 4.02 MIL/uL — ABNORMAL LOW (ref 4.22–5.81)
RDW: 16.8 % — ABNORMAL HIGH (ref 11.5–15.5)
WBC: 15.3 K/uL — ABNORMAL HIGH (ref 4.0–10.5)
nRBC: 0 % (ref 0.0–0.2)

## 2023-12-28 LAB — MAGNESIUM: Magnesium: 2 mg/dL (ref 1.7–2.4)

## 2023-12-28 LAB — PROCALCITONIN: Procalcitonin: 27 ng/mL

## 2023-12-28 LAB — LACTIC ACID, PLASMA
Lactic Acid, Venous: 5 mmol/L (ref 0.5–1.9)
Lactic Acid, Venous: 6.4 mmol/L (ref 0.5–1.9)
Lactic Acid, Venous: 1.7 mmol/L (ref 0.5–1.9)

## 2023-12-28 LAB — GLUCOSE, CAPILLARY: Glucose-Capillary: 90 mg/dL (ref 70–99)

## 2023-12-28 LAB — BRAIN NATRIURETIC PEPTIDE: B Natriuretic Peptide: 583 pg/mL — ABNORMAL HIGH (ref 0.0–100.0)

## 2023-12-28 MED ORDER — LACTATED RINGERS IV BOLUS (SEPSIS): 1000.0000 mL | Freq: Once | INTRAVENOUS | Status: DC

## 2023-12-28 MED ORDER — METOPROLOL TARTRATE 5 MG/5ML IV SOLN: 5.0000 mg | INTRAVENOUS | Status: DC | PRN

## 2023-12-28 MED ORDER — FLEET ENEMA RE ENEM: 1.0000 | ENEMA | Freq: Once | RECTAL | Status: DC | PRN

## 2023-12-28 MED ORDER — OXYBUTYNIN CHLORIDE 5 MG PO TABS
5.0000 mg | ORAL_TABLET | Freq: Every evening | ORAL | Status: DC
  Administered 2023-12-28 – 2024-01-06 (×13): 5 mg via ORAL
  Filled 2023-12-28 (×10): qty 1

## 2023-12-28 MED ORDER — SODIUM CHLORIDE 0.9 % IV SOLN: 2.0000 g | Freq: Once | INTRAVENOUS | Status: DC

## 2023-12-28 MED ORDER — FLORANEX PO PACK
1.0000 g | PACK | Freq: Three times a day (TID) | ORAL | Status: DC
  Administered 2023-12-28 – 2024-01-03 (×16): 1 g via ORAL
  Filled 2023-12-28 (×22): qty 1

## 2023-12-28 MED ORDER — LACTATED RINGERS IV BOLUS (SEPSIS)
500.0000 mL | Freq: Once | INTRAVENOUS | Status: AC
  Administered 2023-12-28: 500 mL via INTRAVENOUS

## 2023-12-28 MED ORDER — SODIUM CHLORIDE 0.9 % IV SOLN: 2.0000 g | Freq: Two times a day (BID) | INTRAVENOUS | Status: DC

## 2023-12-28 MED ORDER — ORAL CARE MOUTH RINSE
15.0000 mL | OROMUCOSAL | Status: AC | PRN
Start: 2023-12-28 — End: ?

## 2023-12-28 MED ORDER — METOPROLOL TARTRATE 5 MG/5ML IV SOLN: 2.5000 mg | INTRAVENOUS | Status: DC | PRN

## 2023-12-28 MED ORDER — LACTATED RINGERS IV BOLUS (SEPSIS)
1000.0000 mL | Freq: Once | INTRAVENOUS | Status: AC
  Administered 2023-12-28: 1000 mL via INTRAVENOUS

## 2023-12-28 MED ORDER — NOREPINEPHRINE 4 MG/250ML-% IV SOLN
0.0000 ug/min | INTRAVENOUS | Status: DC
  Administered 2023-12-28 – 2023-12-29 (×3): 2 ug/min via INTRAVENOUS
  Filled 2023-12-28 (×2): qty 250

## 2023-12-28 MED ORDER — SODIUM CHLORIDE 0.9 % IV SOLN
500.0000 mg | Freq: Once | INTRAVENOUS | Status: AC
  Administered 2023-12-28: 500 mg via INTRAVENOUS
  Filled 2023-12-28: qty 5

## 2023-12-28 MED ORDER — ZINC OXIDE 40 % EX OINT
TOPICAL_OINTMENT | Freq: Two times a day (BID) | CUTANEOUS | Status: DC
  Administered 2023-12-30 – 2024-01-04 (×5): 1 via TOPICAL
  Filled 2023-12-28: qty 57

## 2023-12-28 MED ORDER — ACETAMINOPHEN 650 MG RE SUPP: 650.0000 mg | Freq: Four times a day (QID) | RECTAL | Status: DC | PRN

## 2023-12-28 MED ORDER — MUPIROCIN 2 % EX OINT
TOPICAL_OINTMENT | Freq: Two times a day (BID) | CUTANEOUS | Status: DC
  Administered 2023-12-28 – 2024-01-04 (×8): 1 via NASAL
  Filled 2023-12-28 (×3): qty 22

## 2023-12-28 MED ORDER — LACTATED RINGERS IV SOLN
150.0000 mL/h | INTRAVENOUS | Status: DC
  Administered 2023-12-28 (×2): 150 mL/h via INTRAVENOUS

## 2023-12-28 MED ORDER — METOPROLOL TARTRATE 50 MG PO TABS
50.0000 mg | ORAL_TABLET | Freq: Two times a day (BID) | ORAL | Status: DC
  Administered 2023-12-29 (×4): 50 mg via ORAL
  Filled 2023-12-28 (×2): qty 1

## 2023-12-28 MED ORDER — ONDANSETRON HCL 4 MG PO TABS: 4.0000 mg | ORAL_TABLET | Freq: Four times a day (QID) | ORAL | Status: DC | PRN

## 2023-12-28 MED ORDER — SODIUM CHLORIDE 0.9% FLUSH
3.0000 mL | Freq: Two times a day (BID) | INTRAVENOUS | Status: DC
  Administered 2023-12-28 – 2024-01-06 (×24): 3 mL via INTRAVENOUS

## 2023-12-28 MED ORDER — HYDROMORPHONE HCL 1 MG/ML IJ SOLN: 0.5000 mg | INTRAMUSCULAR | Status: DC | PRN

## 2023-12-28 MED ORDER — VANCOMYCIN HCL 2000 MG/400ML IV SOLN
2000.0000 mg | Freq: Once | INTRAVENOUS | Status: AC
  Administered 2023-12-28: 2000 mg via INTRAVENOUS
  Filled 2023-12-28: qty 400

## 2023-12-28 MED ORDER — MELATONIN 3 MG PO TABS
9.0000 mg | ORAL_TABLET | Freq: Every day | ORAL | Status: DC
  Administered 2024-01-02 – 2024-01-06 (×5): 9 mg via ORAL
  Filled 2023-12-28 (×6): qty 3

## 2023-12-28 MED ORDER — HYDRALAZINE HCL 20 MG/ML IJ SOLN: 10.0000 mg | INTRAMUSCULAR | Status: DC | PRN

## 2023-12-28 MED ORDER — LEVALBUTEROL HCL 0.63 MG/3ML IN NEBU: 0.6300 mg | INHALATION_SOLUTION | Freq: Four times a day (QID) | RESPIRATORY_TRACT | Status: DC | PRN

## 2023-12-28 MED ORDER — BISACODYL 5 MG PO TBEC: 5.0000 mg | DELAYED_RELEASE_TABLET | Freq: Every day | ORAL | Status: DC | PRN

## 2023-12-28 MED ORDER — SODIUM CHLORIDE 0.9 % IV SOLN: INTRAVENOUS | Status: DC

## 2023-12-28 MED ORDER — SODIUM CHLORIDE 0.9 % IV BOLUS
1000.0000 mL | Freq: Once | INTRAVENOUS | Status: AC
  Administered 2023-12-28: 1000 mL via INTRAVENOUS

## 2023-12-28 MED ORDER — CLOTRIMAZOLE-BETAMETHASONE 1-0.05 % EX CREA
1.0000 | TOPICAL_CREAM | Freq: Two times a day (BID) | CUTANEOUS | Status: DC
  Administered 2023-12-28 – 2024-01-06 (×23): 1 via TOPICAL
  Filled 2023-12-28: qty 15

## 2023-12-28 MED ORDER — ACETAMINOPHEN 650 MG RE SUPP
650.0000 mg | Freq: Once | RECTAL | Status: AC
  Administered 2023-12-28: 650 mg via RECTAL
  Filled 2023-12-28: qty 1

## 2023-12-28 MED ORDER — ONDANSETRON HCL 4 MG/2ML IJ SOLN: 4.0000 mg | Freq: Four times a day (QID) | INTRAMUSCULAR | Status: DC | PRN

## 2023-12-28 MED ORDER — HEPARIN SODIUM (PORCINE) 5000 UNIT/ML IJ SOLN: 5000.0000 [IU] | Freq: Three times a day (TID) | INTRAMUSCULAR | Status: DC

## 2023-12-28 MED ORDER — ACETAMINOPHEN 325 MG PO TABS
650.0000 mg | ORAL_TABLET | Freq: Four times a day (QID) | ORAL | Status: DC | PRN
  Administered 2023-12-29 – 2023-12-31 (×4): 650 mg via ORAL
  Filled 2023-12-28 (×2): qty 2

## 2023-12-28 MED ORDER — APIXABAN 5 MG PO TABS
5.0000 mg | ORAL_TABLET | Freq: Two times a day (BID) | ORAL | Status: DC
  Administered 2023-12-29 – 2024-01-06 (×24): 5 mg via ORAL
  Filled 2023-12-28 (×20): qty 1

## 2023-12-28 MED ORDER — SODIUM CHLORIDE 0.9 % IV SOLN: 250.0000 mL | INTRAVENOUS | Status: AC

## 2023-12-28 MED ORDER — SODIUM CHLORIDE 0.9% FLUSH
3.0000 mL | Freq: Two times a day (BID) | INTRAVENOUS | Status: DC
  Administered 2023-12-28 – 2023-12-31 (×14): 3 mL via INTRAVENOUS

## 2023-12-28 MED ORDER — SODIUM CHLORIDE 0.9 % IV BOLUS
2000.0000 mL | Freq: Once | INTRAVENOUS | Status: AC
  Administered 2023-12-28: 2000 mL via INTRAVENOUS

## 2023-12-28 MED ORDER — PIPERACILLIN-TAZOBACTAM 3.375 G IVPB
3.3750 g | Freq: Three times a day (TID) | INTRAVENOUS | Status: DC
  Administered 2023-12-28 – 2023-12-29 (×5): 3.375 g via INTRAVENOUS
  Filled 2023-12-28 (×3): qty 50

## 2023-12-28 MED ORDER — MEDIHONEY WOUND/BURN DRESSING EX PSTE
1.0000 | PASTE | Freq: Every day | CUTANEOUS | Status: DC
  Administered 2023-12-28 – 2024-01-05 (×12): 1 via TOPICAL
  Filled 2023-12-28 (×2): qty 44

## 2023-12-28 MED ORDER — SENNOSIDES-DOCUSATE SODIUM 8.6-50 MG PO TABS: 1.0000 | ORAL_TABLET | Freq: Every evening | ORAL | Status: DC | PRN

## 2023-12-28 MED ORDER — OXYCODONE HCL 5 MG PO TABS
5.0000 mg | ORAL_TABLET | ORAL | Status: DC | PRN
  Administered 2023-12-28 – 2024-01-06 (×5): 5 mg via ORAL
  Filled 2023-12-28 (×4): qty 1

## 2023-12-28 MED ORDER — IPRATROPIUM BROMIDE 0.02 % IN SOLN: 0.5000 mg | Freq: Four times a day (QID) | RESPIRATORY_TRACT | Status: DC | PRN

## 2023-12-28 MED ORDER — SODIUM CHLORIDE 0.9 % IV SOLN
2.0000 g | Freq: Once | INTRAVENOUS | Status: AC
  Administered 2023-12-28: 2 g via INTRAVENOUS
  Filled 2023-12-28: qty 20

## 2023-12-28 MED ORDER — CHLORHEXIDINE GLUCONATE CLOTH 2 % EX PADS
6.0000 | MEDICATED_PAD | Freq: Every day | CUTANEOUS | Status: DC
  Administered 2023-12-29 – 2024-01-06 (×11): 6 via TOPICAL

## 2023-12-28 MED ORDER — DILTIAZEM HCL ER COATED BEADS 180 MG PO CP24: 180.0000 mg | ORAL_CAPSULE | Freq: Every day | ORAL | Status: DC

## 2023-12-28 MED ORDER — TRAZODONE HCL 50 MG PO TABS
25.0000 mg | ORAL_TABLET | Freq: Every evening | ORAL | Status: DC | PRN
  Administered 2024-01-02 – 2024-01-06 (×4): 25 mg via ORAL
  Filled 2023-12-28 (×4): qty 1

## 2023-12-28 MED ORDER — VANCOMYCIN HCL 1500 MG/300ML IV SOLN: 1500.0000 mg | INTRAVENOUS | Status: DC

## 2023-12-28 NOTE — H&P (Signed)
 History and Physical   Patient: Gary Carroll [facility]                            PCP: [facility]                    DOB: 07/26/1945            DOA: 12/28/2023 FMW:[id number]             DOS: 12/28/2023, 3:20 PM  Clinic, [facility]  Patient coming from:   HOME  I have personally reviewed patient's medical records, in electronic medical records, including:  [facility], and care everywhere.    Chief Complaint:   Chief Complaint  Patient presents with   Code Sepsis    History of present illness:    Gary Carroll is a 78 year old with extensive history of severe aortic stenosis s/p TAVR, paroxysmal A-fib on Eliquis , CAD, rectal adenocarcinoma, HTN, HLD, DM2, chronic heart failure HFpEF, presented to ED from nursing home with chief complaint of generalized weaknesses, fever, confusion.  Patient is poor historian at this point-electronic record review reveals patient has had multiple hospitalization for multiple comorbidities last discharged on 09/30/2023 after treatment for sepsis, UTI secondary to E. coli, Enterobacter, E. coli bacteremia COVID infection in April, E. coli sepsis.   ED evaluation: Vitals:   12/28/23 1303 12/28/23 1315  BP:  105/85  Pulse: (!) 123 (!) 123  Resp: 11 (!) 41  Temp: (!) 103.4 F (39.7 C) (!) 102.9 F (39.4 C)  SpO2: 98% 100%    Labs: CMP potassium 5.3, BUN 28, creatinine 1.54, albumin 2.6, AST 63 ALT 47, lactic acid 5.0 CBC WBC 15.3, hemoglobin 11.9, neutrophil 15.0, INR 2.4, Respiratory panel-influenza A, B, COVID-all negative UA amber, moderate leukocytes, negative for nitrites, bacteria none, WBC > 50  Chest x-ray:  Patchy retrocardiac airspace opacities, likely atelectasis in the setting of low lung volumes. Alternatively, a developing bronchopneumonia could have this appearance in the correct clinical context.   Requested patient to be admitted, as patient meets sepsis criteria-likely source UTI with low probability  of pneumonia  Sepsis protocol initiated, IV fluids, broad-spectrum antibiotics cultures been obtained   Patient Denies having:, Cough, SOB, Chest Pain, Abd pain, N/V/D, headache, dizziness, lightheadedness,  Dysuria, Joint pain, rash, open wounds     Review of Systems: As per HPI, otherwise 10 point review of systems were negative.   ----------------------------------------------------------------------------------------------------------------------  Allergies  Allergen Reactions   Diovan [Valsartan] Cough   Tagamet [Cimetidine] Other (See Comments)    Irritability     Home MEDs:  Prior to Admission medications   Medication Sig Start Date End Date Taking? Authorizing Provider  apixaban  (ELIQUIS ) 5 MG TABS tablet Take 1 tablet (5 mg total) by mouth 2 (two) times daily. 08/24/15  Yes Theophilus Andrews, Tully GRADE, MD  clotrimazole -betamethasone  (LOTRISONE ) cream Apply 1 Application topically in the morning, at noon, and at bedtime. To groin/scrotum/perineum for dermatitis   Yes [provider]  diltiazem  (CARDIZEM  CD) 180 MG 24 hr capsule Take 1 capsule (180 mg total) by mouth daily. 09/30/23  Yes Johnson, [name], MD  dutasteride  (AVODART ) 0.5 MG capsule Take 0.5 mg by mouth daily.   Yes [provider]  losartan  (COZAAR ) 50 MG tablet Take 1 tablet (50 mg total) by mouth daily. 09/30/23  Yes Johnson, [name], MD  Magnesium  500 MG CAPS Take 500 mg by mouth daily.  Yes [provider]  Melatonin 10 MG CAPS Take 10 mg by mouth at bedtime.   Yes [provider]  metFORMIN  (GLUCOPHAGE ) 500 MG tablet Take 500 mg by mouth 2 (two) times daily with a meal.   Yes [provider]  metoprolol  tartrate (LOPRESSOR ) 50 MG tablet Take 1 tablet (50 mg total) by mouth 2 (two) times daily. 08/25/23  Yes Shah, Pratik D, DO  oxybutynin  (DITROPAN ) 5 MG tablet Take 5 mg by mouth every evening. 06/01/19  Yes [provider]  potassium chloride  (KLOR-CON )  10 MEQ tablet Take 1 tablet (10 mEq total) by mouth daily. 02/06/23  Yes Sebastian Lamarr SAUNDERS, PA-C  rosuvastatin  (CRESTOR ) 20 MG tablet Take 20 mg by mouth daily.   Yes [provider]  vitamin C  (ASCORBIC ACID ) 500 MG tablet Take 500 mg by mouth daily.   Yes [provider]  liver oil-zinc  oxide (DESITIN) 40 % ointment Apply topically as needed for irritation. 09/30/23   Johnson, [name], MD  Multiple Vitamins-Minerals (MULTIVITAL) tablet Take 1 tablet by mouth daily.    [provider]  neomycin -bacitracin-polymyxin (NEOSPORIN) OINT Apply topically. 11/28/22   [provider]  Omega-3 Fatty Acids (FISH OIL) 1000 MG CAPS Take 1,000 mg by mouth in the morning and at bedtime.    [provider]    PRN MEDs: acetaminophen  **OR** acetaminophen , bisacodyl , hydrALAZINE , HYDROmorphone  (DILAUDID ) injection, ipratropium, levalbuterol , ondansetron  **OR** ondansetron  (ZOFRAN ) IV, oxyCODONE , senna-docusate, sodium phosphate, traZODone   Past Medical History:  Diagnosis Date   A-fib (HCC)    Allergic rhinitis    Arthritis    Asthma    seasonal per pt   Bilateral knee pain    OA   CAD (coronary artery disease)    Diabetes mellitus without complication (HCC)    Diverticulosis 02/2012   seen on colonoscopy   Erectile dysfunction    GERD (gastroesophageal reflux disease)    Hiatal hernia    HLD (hyperlipidemia)    HTN (hypertension)    Hydronephrosis determined by ultrasound 08/22/2015   Overactive bladder    Prediabetes    Pulmonary lesion    Ringing of ears    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 Wonda and Dr. Lucas   Severe aortic stenosis    Sleep apnea    Urinary frequency    Urinary urgency    Wears glasses     Past Surgical History:  Procedure Laterality Date   ABDOMINAL AORTOGRAM W/LOWER EXTREMITY N/A 02/05/2023   Procedure: ABDOMINAL AORTOGRAM W/LOWER EXTREMITY;  Surgeon:  Thukkani, Arun K, MD;  Location: [facility] LAB;  Service: Cardiovascular;  Laterality: N/A;   CATARACT EXTRACTION W/PHACO Left 12/07/2021   Procedure: CATARACT EXTRACTION PHACO AND INTRAOCULAR LENS PLACEMENT (IOC);  Surgeon: Harrie Agent, MD;  Location: [facility] ORS;  Service: Ophthalmology;  Laterality: Left;  CDE 7.75   CATARACT EXTRACTION W/PHACO Right 12/21/2021   Procedure: CATARACT EXTRACTION PHACO AND INTRAOCULAR LENS PLACEMENT (IOC);  Surgeon: Harrie Agent, MD;  Location: [facility];  Service: Ophthalmology;  Laterality: Right;  CDE: 8.00   CHOLECYSTECTOMY     COLONOSCOPY  02/2012   Dr. Debrah: sigmoid, transverse, and ascending colon diverticulosis   COLONOSCOPY WITH PROPOFOL  N/A 01/13/2023   Procedure: COLONOSCOPY WITH PROPOFOL ;  Surgeon: Cindie Carlin POUR, DO;  Location: [facility];  Service: Endoscopy;  Laterality: N/A;  1:00 pm, asa 3   FINGER SURGERY Right    INTRAOPERATIVE TRANSTHORACIC  ECHOCARDIOGRAM N/A 02/25/2023   Procedure: INTRAOPERATIVE TRANSTHORACIC ECHOCARDIOGRAM;  Surgeon: Wonda Sharper, MD;  Location: [facility] INVASIVE CV LAB;  Service: Open Heart Surgery;  Laterality: N/A;   prostate procedure  2017   per patient, was having trouble urinating    RIGHT HEART CATH AND CORONARY ANGIOGRAPHY N/A 02/05/2023   Procedure: RIGHT HEART CATH AND CORONARY ANGIOGRAPHY;  Surgeon: Wendel Lurena POUR, MD;  Location: [facility];  Service: Cardiovascular;  Laterality: N/A;   TONSILLECTOMY     TOTAL KNEE ARTHROPLASTY  2012   left (Dr. Dorian)   TOTAL KNEE ARTHROPLASTY Right 06/19/2015   Procedure: TOTAL KNEE ARTHROPLASTY;  Surgeon: Marcey Raman, MD;  Location: [facility] OR;  Service: Orthopedics;  Laterality: Right;   TRANSCATHETER AORTIC VALVE REPLACEMENT, TRANSFEMORAL N/A 02/25/2023   Procedure: Transcatheter Aortic Valve Replacement, Transfemoral;  Surgeon: Wonda Sharper, MD;  Location: [facility] INVASIVE CV LAB;  Service: Open Heart Surgery;  Laterality: N/A;     reports that he quit smoking about  13 years ago. His smoking use included cigarettes. He has never used smokeless tobacco. He reports that he does not currently use alcohol after a past usage of about 1.0 - 2.0 standard drink of alcohol per week. He reports that he does not use drugs.   Family History  Problem Relation Age of Onset   Uterine cancer Mother    Cancer Mother        uterine   Coronary artery disease Father    Coronary artery disease Paternal Grandfather    Heart disease Paternal Grandfather    Colon cancer Neg Hx    Esophageal cancer Neg Hx    Stomach cancer Neg Hx    Rectal cancer Neg Hx    Liver disease Neg Hx     Physical Exam:   Vitals:   12/28/23 1400 12/28/23 1415 12/28/23 1457 12/28/23 1511  BP:   (!) 109/92 107/64  Pulse: (!) 117 (!) 121 (!) 121 (!) 120  Resp:  (!) 34 (!) 27 (!) 27  Temp: (!) 102.5 F (39.2 C) (!) 102.5 F (39.2 C) (!) 102.6 F (39.2 C)   TempSrc:   Bladder   SpO2: 100% 96% 100% 100%  Weight:   93.3 kg   Height:   6' 2 (1.88 m)    Constitutional: Pleasantly confused, following, Eyes: PERRL, lids and conjunctivae normal ENMT: poor oral hygiene, poor dentition: Mucous membranes are moist. Posterior pharynx clear of any exudate or lesions.Normal dentition.  Neck: normal, supple, no masses, no thyromegaly Respiratory: clear to auscultation bilaterally, no wheezing, no crackles. Normal respiratory effort. No accessory muscle use.  Cardiovascular: Regular rate and rhythm, no murmurs / rubs / gallops. No extremity edema. 2+ pedal pulses. No carotid bruits.  Abdomen: no tenderness, no masses palpated. No hepatosplenomegaly. Bowel sounds positive.  Musculoskeletal: no clubbing / cyanosis. No joint deformity upper and lower extremities. Good ROM, no contractures. Normal muscle tone.  Bilateral lower extremity +1 edema with venous stasis Neurologic: CN II-XII grossly intact. Sensation intact, DTR normal. Strength 5/5 in all 4.  Psychiatric: Awake, confused alert x 2 Skin: no  rashes, lesions, ulcers. No induration Decubitus/ulcers: Per nursing Foley catheter in place -was placed in ED -cloudy urine noted wounds: Open rectal fissure     Media Information  Document Information  Photos    12/28/2023 14:47  Attached To:  Hospital Encounter on 12/28/23  Source Information  Kenyen Candy, Adriana LABOR, MD  [facility]        Labs  on admission:    I have personally reviewed following labs and imaging studies  CBC: Recent Labs  Lab 12/28/23 1225  WBC 15.3*  NEUTROABS 15.0*  HGB 11.9*  HCT 38.3*  MCV 95.3  PLT 159   Basic Metabolic Panel: Recent Labs  Lab 12/28/23 1221 12/28/23 1225  NA  --  136  K  --  5.3*  CL  --  102  CO2  --  19*  GLUCOSE  --  95  BUN  --  28*  CREATININE  --  1.54*  CALCIUM   --  8.8*  MG 2.0  --   PHOS 4.5  --    GFR: Estimated Creatinine Clearance: 46 mL/min (A) (by C-G formula based on SCr of 1.54 mg/dL (H)). Liver Function Tests: Recent Labs  Lab 12/28/23 1225  AST 63*  ALT 47*  ALKPHOS 123  BILITOT 0.9  PROT 6.7  ALBUMIN 2.6*   No results for input(s): LIPASE, AMYLASE in the last 168 hours. No results for input(s): AMMONIA in the last 168 hours. Coagulation Profile: Recent Labs  Lab 12/28/23 1225  INR 2.4*     Urine analysis:    Component Value Date/Time   COLORURINE AMBER (A) 12/28/2023 1153   APPEARANCEUR CLOUDY (A) 12/28/2023 1153   LABSPEC 1.014 12/28/2023 1153   PHURINE 7.0 12/28/2023 1153   GLUCOSEU NEGATIVE 12/28/2023 1153   HGBUR SMALL (A) 12/28/2023 1153   BILIRUBINUR NEGATIVE 12/28/2023 1153   BILIRUBINUR neg 08/31/2012 1203   KETONESUR NEGATIVE 12/28/2023 1153   PROTEINUR 30 (A) 12/28/2023 1153   UROBILINOGEN negative 08/31/2012 1203   UROBILINOGEN 0.2 05/31/2010 1125   NITRITE NEGATIVE 12/28/2023 1153   LEUKOCYTESUR MODERATE (A) 12/28/2023 1153    Last A1C:  Lab Results  Component Value Date   HGBA1C 6.1 (H) 08/21/2023     Radiologic Exams on  Admission:   DG Chest Port 1 View Result Date: 12/28/2023 CLINICAL DATA:  Questionable sepsis - evaluate for abnormality EXAM: PORTABLE CHEST - 1 VIEW COMPARISON:  Sep 22, 2023 FINDINGS: Low lung volumes. Patchy retrocardiac airspace opacities. No pleural effusion or pneumothorax. Moderate cardiomegaly. 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 No acute fracture or destructive lesions. Multilevel thoracic osteophytosis. Osteopenia. IMPRESSION: Patchy retrocardiac airspace opacities, likely atelectasis in the setting of low lung volumes. Alternatively, a developing bronchopneumonia could have this appearance in the correct clinical context. Electronically Signed   By: Rogelia Myers M.D.   On: 12/28/2023 12:41    EKG:   Independently reviewed.  Orders placed or performed during the hospital encounter of 12/28/23   ED EKG   ED EKG   EKG 12-Lead   ---------------------------------------------------------------------------------------------------------------------------------------    Assessment / Plan:   Active Problems:   Elevated transaminase level   (HFpEF) heart failure with preserved ejection fraction (HCC)   Sepsis (HCC)   Benign prostatic hyperplasia with lower urinary tract symptoms   Disease of anus and rectum, unspecified   Essential hypertension   Hyperlipidemia, unspecified   AKI (acute kidney injury) (HCC)   Gastroesophageal reflux disease without esophagitis   AF (paroxysmal atrial fibrillation) (HCC)   Urinary tract infection   Aortic stenosis   CAD (coronary artery disease)   S/P TAVR (transcatheter aortic valve replacement)   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 with unspecified severity (HCC)   Morbid obesity (HCC)   Insomnia   Chronic venous insufficiency   Complication of diabetes mellitus (HCC)   Sepsis secondary to  UTI (HCC)   Adjustment disorder with depressed mood   Assessment and  Plan: Sepsis (HCC) - Likely urinary source, UTI,  cannot rule out pneumonia bases on CXr POA: Meeting sepsis criteria -Will be admitted to stepdown unit under close observation monitor -Initiated sepsis protocol, aggressive IV fluid resuscitation, patient has been given Rocephin /azithromycin , will broaden to IV Cefepime  -Will follow-up with blood and urine cultures  - Lactic acidosis-  LA 5.0   - Obtaining procalcitonin level - will monitor closely, continue IVF -Patient does have a history of CHF, with the fluid resuscitation we will monitor for fluid overload-in stepdown setting  (HFpEF) heart failure with preserved ejection fraction (HCC) - POA euvolemic -Last Echo 12/ 2024 EF 60-65%, normal LV function moderate concentric LVH normal right ventricular systolic function, normal mitral and tricuspid valve repair replaced aortic valve  Due to sepsis we will continue IV fluid resuscitation  - Will monitor I's and O's, daily weight - Will try to avoid volume overload -   Elevated transaminase level - Likely exacerbated by sepsis, on statins -Continue IV fluids, holding statins -Will avoid hepatotoxins  Disease of anus and rectum, unspecified - History of rectal adenocarcinoma with chronic fissure -Continue wound care -No signs of infection  Benign prostatic hyperplasia with lower urinary tract symptoms - Monitoring for urinary retention -Temporary Foley catheter -Resuming home meds  9 including oxybutynin  0  Adjustment disorder with depressed mood - Currently confused, -Med rec reviewed not on any antidepressant  Complication of diabetes mellitus (HCC) - Holding home medication of metformin  -Checking CBG q. ACHS, SSI coverage -Check an A1c:  (last A1c 4 months ago 6.1)  Insomnia - Continue home medication melatonin -As needed trazodone   Morbid obesity (HCC) Body mass index is 34.17 kg/m. 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 with unspecified severity (HCC) - Continue wound care per nursing  S/P TAVR (transcatheter aortic valve replacement) - Stable continue meds  CAD (coronary artery disease) - Currently stable -Continue home medication including beta-blocker, statins, apixaban  calcium  channel  Aortic stenosis - History of severe aortic stenosis, s/p TAVR Successful TAVR with 26 mm Edwards SAPIEN 3 THV on 02/25/2023     AF (paroxysmal atrial fibrillation) (HCC) - Rate controlled with diltiazem , metoprolol  continuing apixaban   Gastroesophageal reflux disease without esophagitis - Continue PPI  AKI (acute kidney injury) (HCC) - Monitoring BUN/creatinine closely likely elevated due to sepsis, hypovolemia hypotension - Avoid nephrotoxins, hypotension - Continue IVF  Lab Results  Component Value Date   CREATININE 1.54 (H) 12/28/2023   CREATININE 0.78 09/24/2023   CREATININE 0.80 09/23/2023     Hyperlipidemia, unspecified - on statins -holding rosuvastatin  For transaminitis - Will monitor LFTs  Essential hypertension - Currently in sepsis, ruling out septic shock -Holding BP medications including losartan      Consults called:  None -------------------------------------------------------------------------------------------------------------------------------------------- DVT prophylaxis:  SCDs Start: 12/28/23 1320 apixaban  (ELIQUIS ) tablet 5 mg   Code Status:   Code Status: Full Code   Admission status: Patient will be admitted as Inpatient, with a greater than 2 midnight length of stay. Level of care: Stepdown   Family Communication:  none at bedside  (The above findings and plan of care has been discussed with patient in detail, the patient expressed understanding and agreement of above plan)   --------------------------------------------------------------------------------------------------------------------------------------------------  Disposition Plan:  Anticipated 1-2 days Status is: Inpatient Remains inpatient appropriate because: Patient  meeting sepsis criteria, needing medical management IV antibiotics IV fluids close monitoring     ----------------------------------------------------------------------------------------------------------------------------------------------------  Critical care time time spent:  70  Min.  Was spent seeing and evaluating the patient, reviewing all medical records, drawn plan of care.  Admitted patient to stepdown unit as patient meets sepsis protocol ruling out septic shock  SIGNED: Adriana DELENA Grams, MD, FHM. FAAFP. [place] [facility], Pager  (Please use [email or web address] to page/ or secure chat through epic) If 7PM-7AM, please contact night-coverage [email or web address],  12/28/2023, 3:20 PM

## 2023-12-28 NOTE — Consult Note (Addendum)
 WOC Nurse Consult Note:patient with historyof rectal CA post chemo and radiation; presents with rectal fissure/wound  Reason for Consult: rectal wound  Wound type:1.  Full thickness to rectum post chemotherapy and radiation appears red and moist  2.  Full thickness L pretibial likely r/t trauma red moist with some dark eschar/hemorrhagic tissue  3.   Stage 3 Pressure Injury medial buttocks and coccyx 50% tan necrotic 50% red moist; some component of moisture associated skin damage as well  Pressure Injury POA: Yes Measurement: see nursing flowsheet  Wound bed: as above  Drainage (amount, consistency, odor) see nursing flowsheet  Periwound: Moisture Associated Skin Damage to buttocks; ecchymosis to leg wound  Dressing procedure/placement/frequency:  Cleanse L lower leg wound with Vashe wound cleanser Soila [phone]) do not rinse and allow to air dry. Apply Xeroform gauze (Lawson [id number]) to wound bed daily and secure with silicone foam or dry gauze and Kerlix roll gauze.   Cleanse coccyx/buttocks with Vashe, apply Medihoney to wound beds daily and cover with dry gauze and silicone foam or ABD pad whichever is preferred.  Cleanse rectal wound/fissure with NS, apply saline moistened gauze to wound bed daily and as needed for soiling, cover with dry gauze and ABD pad to secure.   Will write for a thin layer of Desitin to be applied 2 times daily to intact skin surrounding wounds/rectal fissure to assist with moisture damage.   POC discussed with bedside nurse. WOC team will not follow. Re-consult if further needs arise.   Thank you,    Powell Bar MSN, RN-BC, [employer]

## 2023-12-28 NOTE — Assessment & Plan Note (Signed)
-   Likely exacerbated by sepsis, on statins -Continue IV fluids, holding statins -Will avoid hepatotoxins

## 2023-12-28 NOTE — Assessment & Plan Note (Signed)
 Continue PPI.

## 2023-12-28 NOTE — Assessment & Plan Note (Signed)
-   History of severe aortic stenosis, s/p TAVR Successful TAVR with 26 mm Edwards SAPIEN 3 THV on 02/25/2023

## 2023-12-28 NOTE — Assessment & Plan Note (Signed)
-   Monitoring BUN/creatinine closely likely elevated due to sepsis, hypovolemia hypotension - Avoid nephrotoxins, hypotension - Continue IVF  Lab Results  Component Value Date   CREATININE 1.54 (H) 12/28/2023   CREATININE 0.78 09/24/2023   CREATININE 0.80 09/23/2023

## 2023-12-28 NOTE — Assessment & Plan Note (Addendum)
-   Likely urinary source, UTI,  cannot rule out pneumonia bases on CXr POA: Meeting sepsis criteria -Will be admitted to stepdown unit under close observation monitor -Initiated sepsis protocol, aggressive IV fluid resuscitation, patient has been given Rocephin /azithromycin , will broaden to IV Cefepime  -Will follow-up with blood and urine cultures  - Lactic acidosis-  LA 5.0   - Obtaining procalcitonin level - will monitor closely, continue IVF -Patient does have a history of CHF, with the fluid resuscitation we will monitor for fluid overload-in stepdown setting

## 2023-12-28 NOTE — Progress Notes (Addendum)
 Have clarified with bedside RN that the antibiotic was hung immediately AFTER blood cultures drawn, was scanned just a minute before.

## 2023-12-28 NOTE — Assessment & Plan Note (Signed)
 Body mass index is 34.17 kg/m. - When stable we will discuss regarding diet exercise, weight loss programs to follow with PCP

## 2023-12-28 NOTE — Assessment & Plan Note (Signed)
Stable continue meds

## 2023-12-28 NOTE — Assessment & Plan Note (Signed)
-   History of rectal adenocarcinoma with chronic fissure -Continue wound care -No signs of infection

## 2023-12-28 NOTE — Assessment & Plan Note (Addendum)
-   on statins -holding rosuvastatin  For transaminitis - Will monitor LFTs

## 2023-12-28 NOTE — ED Provider Notes (Signed)
 [place] EMERGENCY DEPARTMENT AT [facility] Provider Note   CSN: [id number] Arrival date & time: 12/28/23  1133     Patient presents with: Code Sepsis   Gary Carroll is a 78 y.o. male.  {Add pertinent medical, surgical, social history, OB history to YEP:67052} Patient was brought in from the nursing home for fever and weakness.  He has a history of hypertension coronary artery disease congestive heart failure diabetes.   Weakness      Prior to Admission medications   Medication Sig Start Date End Date Taking? Authorizing Provider  MATZIM LA  180 MG 24 hr tablet Take 180 mg by mouth daily. 11/29/23  Yes [provider]  acetaminophen  (TYLENOL ) 650 MG CR tablet Take 1,300 mg by mouth every 8 (eight) hours as needed for pain.    [provider]  apixaban  (ELIQUIS ) 5 MG TABS tablet Take 1 tablet (5 mg total) by mouth 2 (two) times daily. 08/24/15   Theophilus Andrews, Tully GRADE, MD  cephALEXin  (KEFLEX ) 500 MG capsule TAKE 1 CAPSULE BY MOUTH THREE TIMES A DAY    [provider]  diltiazem  (CARDIZEM  CD) 180 MG 24 hr capsule Take 1 capsule (180 mg total) by mouth daily. 09/30/23   Johnson, [name], MD  dutasteride  (AVODART ) 0.5 MG capsule Take 0.5 mg by mouth daily.    [provider]  liver oil-zinc  oxide (DESITIN) 40 % ointment Apply topically as needed for irritation. 09/30/23   Johnson, [name], MD  losartan  (COZAAR ) 50 MG tablet Take 1 tablet (50 mg total) by mouth daily. 09/30/23   Johnson, [name], MD  Magnesium  500 MG CAPS Take 500 mg by mouth daily.    [provider]  Melatonin 10 MG CAPS Take 10 mg by mouth at bedtime.    [provider]  metFORMIN  (GLUCOPHAGE ) 500 MG tablet Take 500 mg by mouth 2 (two) times daily with a meal.    [provider]  metoprolol  tartrate (LOPRESSOR ) 50 MG tablet Take 1 tablet (50 mg total) by mouth 2 (two) times daily. 08/25/23   Maree, Pratik D, DO  Multiple  Vitamins-Minerals (MULTIVITAL) tablet Take 1 tablet by mouth daily.    [provider]  neomycin -bacitracin-polymyxin (NEOSPORIN) OINT Apply topically. 11/28/22   [provider]  Omega-3 Fatty Acids (FISH OIL) 1000 MG CAPS Take 1,000 mg by mouth in the morning and at bedtime.    [provider]  oxybutynin  (DITROPAN ) 5 MG tablet Take 5 mg by mouth every evening. 06/01/19   [provider]  potassium chloride  (KLOR-CON ) 10 MEQ tablet Take 1 tablet (10 mEq total) by mouth daily. 02/06/23   Sebastian Lamarr SAUNDERS, PA-C  rosuvastatin  (CRESTOR ) 20 MG tablet Take 20 mg by mouth daily.    [provider]  vitamin C  (ASCORBIC ACID ) 500 MG tablet Take 500 mg by mouth daily.    [provider]    Allergies: Diovan [valsartan] and Tagamet [cimetidine]    Review of Systems  Neurological:  Positive for weakness.    Updated Vital Signs BP 105/85   Pulse (!) 123   Temp (!) 102.9 F (39.4 C)   Resp (!) 41   SpO2 100%   Physical Exam  (all labs ordered are listed, but only abnormal results are displayed) Labs Reviewed  LACTIC ACID, PLASMA - Abnormal; Notable for the following components:      Result Value   Lactic Acid, Venous 5.0 (*)    All other  components within normal limits  COMPREHENSIVE METABOLIC PANEL WITH GFR - Abnormal; Notable for the following components:   Potassium 5.3 (*)    CO2 19 (*)    BUN 28 (*)    Creatinine, Ser 1.54 (*)    Calcium  8.8 (*)    Albumin 2.6 (*)    AST 63 (*)    ALT 47 (*)    GFR, Estimated 46 (*)    All other components within normal limits  CBC WITH DIFFERENTIAL/PLATELET - Abnormal; Notable for the following components:   WBC 15.3 (*)    RBC 4.02 (*)    Hemoglobin 11.9 (*)    HCT 38.3 (*)    RDW 16.8 (*)    Neutro Abs 15.0 (*)    Lymphs Abs 0.1 (*)    All other components within normal limits  PROTIME-INR - Abnormal; Notable for the following components:   Prothrombin Time 26.9 (*)    INR 2.4 (*)     All other components within normal limits  URINALYSIS, W/ REFLEX TO CULTURE (INFECTION SUSPECTED) - Abnormal; Notable for the following components:   Color, Urine AMBER (*)    APPearance CLOUDY (*)    Hgb urine dipstick SMALL (*)    Protein, ur 30 (*)    Leukocytes,Ua MODERATE (*)    All other components within normal limits  RESP PANEL BY RT-PCR (RSV, FLU A&B, COVID)  RVPGX2  CULTURE, BLOOD (ROUTINE X 2)  CULTURE, BLOOD (ROUTINE X 2)  URINE CULTURE  EXPECTORATED SPUTUM ASSESSMENT W GRAM STAIN, RFLX TO RESP C  LACTIC ACID, PLASMA  MAGNESIUM   PHOSPHORUS  PROTIME-INR    EKG: None  Radiology: DG Chest Port 1 View Result Date: 12/28/2023 CLINICAL DATA:  Questionable sepsis - evaluate for abnormality EXAM: PORTABLE CHEST - 1 VIEW COMPARISON:  Sep 22, 2023 FINDINGS: Low lung volumes. Patchy retrocardiac airspace opacities. No pleural effusion or pneumothorax. Moderate cardiomegaly. 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 No acute fracture or destructive lesions. Multilevel thoracic osteophytosis. Osteopenia. IMPRESSION: Patchy retrocardiac airspace opacities, likely atelectasis in the setting of low lung volumes. Alternatively, a developing bronchopneumonia could have this appearance in the correct clinical context. Electronically Signed   By: Rogelia Myers M.D.   On: 12/28/2023 12:41    {Document cardiac monitor, telemetry assessment procedure when appropriate:32947} Procedures   Medications Ordered in the ED  azithromycin  (ZITHROMAX ) 500 mg in sodium chloride  0.9 % 250 mL IVPB (500 mg Intravenous New Bag/Given 12/28/23 1318)  heparin  injection 5,000 Units (has no administration in time range)  sodium chloride  flush (NS) 0.9 % injection 3 mL (has no administration in time range)  0.9 %  sodium chloride  infusion (has no administration in time range)  sodium chloride  flush (NS) 0.9 % injection 3 mL (has no administration in time range)   acetaminophen  (TYLENOL ) tablet 650 mg (has no administration in time range)    Or  acetaminophen  (TYLENOL ) suppository 650 mg (has no administration in time range)  oxyCODONE  (Oxy IR/ROXICODONE ) immediate release tablet 5 mg (has no administration in time range)  HYDROmorphone  (DILAUDID ) injection 0.5-1 mg (has no administration in time range)  traZODone  (DESYREL ) tablet 25 mg (has no administration in time range)  senna-docusate (Senokot-S) tablet 1 tablet (has no administration in time range)  bisacodyl  (DULCOLAX) EC tablet 5 mg (has no administration in time range)  sodium phosphate (FLEET) enema 1 enema (has no administration in time range)  ondansetron  (ZOFRAN ) tablet 4 mg (has no administration in  time range)    Or  ondansetron  (ZOFRAN ) injection 4 mg (has no administration in time range)  ipratropium (ATROVENT ) nebulizer solution 0.5 mg (has no administration in time range)  levalbuterol  (XOPENEX ) nebulizer solution 0.63 mg (has no administration in time range)  hydrALAZINE  (APRESOLINE ) injection 10 mg (has no administration in time range)  sodium chloride  0.9 % bolus 2,000 mL (2,000 mLs Intravenous New Bag/Given 12/28/23 1200)  cefTRIAXone  (ROCEPHIN ) 2 g in sodium chloride  0.9 % 100 mL IVPB (0 g Intravenous Stopped 12/28/23 1254)  acetaminophen  (TYLENOL ) suppository 650 mg (650 mg Rectal Given 12/28/23 1227)  sodium chloride  0.9 % bolus 1,000 mL (1,000 mLs Intravenous New Bag/Given 12/28/23 1318)     CRITICAL CARE Performed by: Fairy Sermon Total critical care time: 45 minutes Critical care time was exclusive of separately billable procedures and treating other patients. Critical care was necessary to treat or prevent imminent or life-threatening deterioration. Critical care was time spent personally by me on the following activities: development of treatment plan with patient and/or surrogate as well as nursing, discussions with consultants, evaluation of patient's response to  treatment, examination of patient, obtaining history from patient or surrogate, ordering and performing treatments and interventions, ordering and review of laboratory studies, ordering and review of radiographic studies, pulse oximetry and re-evaluation of patient's condition.  {Click here for ABCD2, HEART and other calculators REFRESH Note before signing:1}                              Medical Decision Making Amount and/or Complexity of Data Reviewed Labs: ordered. Radiology: ordered.  Risk OTC drugs. Decision regarding hospitalization.  Patient with sepsis possibly related to urine and or pneumonia.  He will be admitted to medicine  {Document critical care time when appropriate  Document review of labs and clinical decision tools ie CHADS2VASC2, etc  Document your independent review of radiology images and any outside records  Document your discussion with family members, caretakers and with consultants  Document social determinants of health affecting pt's care  Document your decision making why or why not admission, treatments were needed:32947:::1}   Final diagnoses:  None    ED Discharge Orders     None

## 2023-12-28 NOTE — Assessment & Plan Note (Signed)
-   Continue home medication melatonin -As needed trazodone

## 2023-12-28 NOTE — Assessment & Plan Note (Signed)
-   POA euvolemic -Last Echo 12/ 2024 EF 60-65%, normal LV function moderate concentric LVH normal right ventricular systolic function, normal mitral and tricuspid valve repair replaced aortic valve  Due to sepsis we will continue IV fluid resuscitation  - Will monitor I's and O's, daily weight - Will try to avoid volume overload -

## 2023-12-28 NOTE — Hospital Course (Addendum)
 Gary Carroll is a 78 year old with extensive history of severe aortic stenosis s/p TAVR, paroxysmal A-fib on Eliquis , CAD, rectal adenocarcinoma, HTN, HLD, DM2, chronic heart failure HFpEF, presented to ED from nursing home with chief complaint of generalized weaknesses, fever, confusion.  Patient is poor historian at this point-electronic record review reveals patient has had multiple hospitalization for multiple comorbidities last discharged on 09/30/2023 after treatment for sepsis, UTI secondary to E. coli, Enterobacter, E. coli bacteremia COVID infection in April, E. coli sepsis.  Labs: CMP potassium 5.3, BUN 28, creatinine 1.54, albumin 2.6, AST 63 ALT 47, lactic acid 5.0 CBC WBC 15.3, hemoglobin 11.9, neutrophil 15.0, INR 2.4, Respiratory panel-influenza A, B, COVID-all negative UA amber, moderate leukocytes, negative for nitrites, bacteria none, WBC > 50  Chest x-ray:  Patchy retrocardiac airspace opacities, likely atelectasis in the setting of low lung volumes. Alternatively, a developing bronchopneumonia could have this appearance in the correct clinical context.  Requested patient to be admitted, as patient meets sepsis criteria-likely source UTI with low probability of pneumonia  Sepsis protocol initiated, IV fluids, broad-spectrum antibiotics cultures been obtained

## 2023-12-28 NOTE — Assessment & Plan Note (Signed)
-   Currently in sepsis, ruling out septic shock -Holding BP medications including losartan

## 2023-12-28 NOTE — Assessment & Plan Note (Signed)
-   Holding home medication of metformin  -Checking CBG q. ACHS, SSI coverage -Check an A1c:  (last A1c 4 months ago 6.1)

## 2023-12-28 NOTE — Progress Notes (Signed)
 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 Mupirocin  ordered per protocol.

## 2023-12-28 NOTE — ED Triage Notes (Signed)
 BIB EMS from [place].  Called out for breathing problems.  Pt has COPD and is on 2L [place] continuously.  EMS VS 109/75, hR 120, RR 34, ETCO2 20, T 101.8, CBG 79

## 2023-12-28 NOTE — ED Notes (Signed)
Pt's oxygen increased to 4L 

## 2023-12-28 NOTE — Assessment & Plan Note (Signed)
-   Rate controlled with diltiazem , metoprolol  continuing apixaban

## 2023-12-28 NOTE — Assessment & Plan Note (Signed)
-   Currently confused, -Med rec reviewed not on any antidepressant

## 2023-12-28 NOTE — Plan of Care (Signed)
  Problem: Pain Managment: Goal: General experience of comfort will improve and/or be controlled Outcome: Progressing

## 2023-12-28 NOTE — Progress Notes (Signed)
 Pharmacy Antibiotic Note  Gary Carroll is a 78 y.o. male admitted on 12/28/2023 with bacteremia.  Pharmacy has been consulted for Vancomcyin dosing.  Plan: Vancomycin  2000 mg IV loading dose followed by Vancomycin  1500 mg IV Q 24 hrs. Goal AUC 400-550. Expected AUC: 524.8 SCr used: 1.54 Expected Cmin: 13.4   Height: 6' 2 (188 cm) Weight: 93.3 kg (205 lb 11 oz) IBW/kg (Calculated) : 82.2  Temp (24hrs), Avg:101.6 F (38.7 C), Min:96.8 F (36 C), Max:103.5 F (39.7 C)  Recent Labs  Lab 12/28/23 1225 12/28/23 1410  WBC 15.3*  --   CREATININE 1.54*  --   LATICACIDVEN 5.0* 6.4*    Estimated Creatinine Clearance: 46 mL/min (A) (by C-G formula based on SCr of 1.54 mg/dL (H)).    Allergies  Allergen Reactions   Diovan [Valsartan] Cough   Tagamet [Cimetidine] Other (See Comments)    Irritability     Antimicrobials this admission: Cefepime  8/10 >> 8/10 Zosyn  8/10 >>  Vancomycin  8/10 >>  Dose adjustments this admission:  Microbiology results: 8/10 BCx: GPC 1 of 4 8/10 MRSA PCR: positive  Thank you for allowing pharmacy to be a part of this patient's care.  Olam Fritter, PharmD, BCPS 12/28/2023 8:39 PM

## 2023-12-28 NOTE — Assessment & Plan Note (Addendum)
-   Monitoring for urinary retention -Temporary Foley catheter -Resuming home meds  9 including oxybutynin  0

## 2023-12-28 NOTE — ED Notes (Signed)
 Report given to ICU RN

## 2023-12-28 NOTE — Assessment & Plan Note (Signed)
-   Currently stable -Continue home medication including beta-blocker, statins, apixaban  calcium  channel

## 2023-12-28 NOTE — Progress Notes (Signed)
 Elink following for sepsis protocol.

## 2023-12-28 NOTE — Assessment & Plan Note (Signed)
 Continue wound care per nursing.

## 2023-12-29 ENCOUNTER — Other Ambulatory Visit (HOSPITAL_COMMUNITY)

## 2023-12-29 DIAGNOSIS — L97519 Non-pressure chronic ulcer of other part of right foot with unspecified severity: Secondary | ICD-10-CM

## 2023-12-29 DIAGNOSIS — I5032 Chronic diastolic (congestive) heart failure: Secondary | ICD-10-CM

## 2023-12-29 DIAGNOSIS — A491 Streptococcal infection, unspecified site: Secondary | ICD-10-CM

## 2023-12-29 DIAGNOSIS — L97915 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 with muscle involvement without evidence of necrosis: Secondary | ICD-10-CM | POA: Diagnosis not present

## 2023-12-29 DIAGNOSIS — Z515 Encounter for palliative care: Secondary | ICD-10-CM

## 2023-12-29 DIAGNOSIS — N390 Urinary tract infection, site not specified: Secondary | ICD-10-CM | POA: Diagnosis not present

## 2023-12-29 DIAGNOSIS — Z7189 Other specified counseling: Secondary | ICD-10-CM | POA: Diagnosis not present

## 2023-12-29 DIAGNOSIS — R7401 Elevation of levels of liver transaminase levels: Secondary | ICD-10-CM

## 2023-12-29 DIAGNOSIS — Z96653 Presence of artificial knee joint, bilateral: Secondary | ICD-10-CM

## 2023-12-29 DIAGNOSIS — K601 Chronic anal fissure: Secondary | ICD-10-CM

## 2023-12-29 DIAGNOSIS — Z952 Presence of prosthetic heart valve: Secondary | ICD-10-CM

## 2023-12-29 DIAGNOSIS — A419 Sepsis, unspecified organism: Secondary | ICD-10-CM | POA: Diagnosis not present

## 2023-12-29 LAB — COMPREHENSIVE METABOLIC PANEL WITH GFR
ALT: 33 U/L (ref 0–44)
AST: 44 U/L — ABNORMAL HIGH (ref 15–41)
Albumin: 1.9 g/dL — ABNORMAL LOW (ref 3.5–5.0)
Alkaline Phosphatase: 87 U/L (ref 38–126)
Anion gap: 7 (ref 5–15)
BUN: 25 mg/dL — ABNORMAL HIGH (ref 8–23)
CO2: 20 mmol/L — ABNORMAL LOW (ref 22–32)
Calcium: 7.8 mg/dL — ABNORMAL LOW (ref 8.9–10.3)
Chloride: 109 mmol/L (ref 98–111)
Creatinine, Ser: 0.73 mg/dL (ref 0.61–1.24)
GFR, Estimated: >60 mL/min (ref >60)
Glucose, Bld: 110 mg/dL — ABNORMAL HIGH (ref 70–99)
Potassium: 4 mmol/L (ref 3.5–5.1)
Sodium: 136 mmol/L (ref 135–145)
Total Bilirubin: 0.8 mg/dL (ref 0.0–1.2)
Total Protein: 5 g/dL — ABNORMAL LOW (ref 6.5–8.1)

## 2023-12-29 LAB — BLOOD CULTURE ID PANEL (REFLEXED) - BCID2

## 2023-12-29 LAB — CBC
HCT: 30.6 % — ABNORMAL LOW (ref 39.0–52.0)
Hemoglobin: 9.3 g/dL — ABNORMAL LOW (ref 13.0–17.0)
MCH: 29.2 pg (ref 26.0–34.0)
MCHC: 30.4 g/dL (ref 30.0–36.0)
MCV: 96.2 fL (ref 80.0–100.0)
Platelets: 110 K/uL — ABNORMAL LOW (ref 150–400)
RBC: 3.18 MIL/uL — ABNORMAL LOW (ref 4.22–5.81)
RDW: 17 % — ABNORMAL HIGH (ref 11.5–15.5)
WBC: 17.7 K/uL — ABNORMAL HIGH (ref 4.0–10.5)
nRBC: 0 % (ref 0.0–0.2)

## 2023-12-29 LAB — BRAIN NATRIURETIC PEPTIDE: B Natriuretic Peptide: 477 pg/mL — ABNORMAL HIGH (ref 0.0–100.0)

## 2023-12-29 LAB — GLUCOSE, CAPILLARY: Glucose-Capillary: 106 mg/dL — ABNORMAL HIGH (ref 70–99)

## 2023-12-29 MED ORDER — LACTATED RINGERS IV SOLN: INTRAVENOUS | Status: AC

## 2023-12-29 MED ORDER — MIDODRINE HCL 5 MG PO TABS
5.0000 mg | ORAL_TABLET | Freq: Three times a day (TID) | ORAL | Status: DC
  Administered 2023-12-29 – 2024-01-01 (×19): 5 mg via ORAL
  Filled 2023-12-29 (×10): qty 1

## 2023-12-29 MED ORDER — DILTIAZEM HCL ER COATED BEADS 180 MG PO CP24: 180.0000 mg | ORAL_CAPSULE | Freq: Every day | ORAL | Status: DC

## 2023-12-29 MED ORDER — SODIUM CHLORIDE 0.9 % IV SOLN
2.0000 g | INTRAVENOUS | Status: DC
  Administered 2023-12-29 – 2023-12-30 (×4): 2 g via INTRAVENOUS
  Filled 2023-12-29 (×2): qty 20

## 2023-12-29 MED ORDER — LACTATED RINGERS IV BOLUS
1000.0000 mL | Freq: Once | INTRAVENOUS | Status: AC
  Administered 2023-12-29 (×2): 1000 mL via INTRAVENOUS

## 2023-12-29 NOTE — Progress Notes (Signed)
 [facility] [place] here to see patient. Updated friend on patient's condition. [name] notified this nurse that she is patient's POA. Copy of POA paperwork placed in chart. Yetta is requesting for patient to be moved to a rehab facility here in [place] rather than going back to [facility]. Will let CM aware.

## 2023-12-29 NOTE — TOC Progression Note (Signed)
 Transition of Care [facility]) - Progression Note    Patient Details  Name: Gary Carroll MRN: [id number] Date of Birth: Nov 06, 1945  [facility] CM/SW Contact  Hoy DELENA Bigness, LCSW Phone Number: 12/29/2023, 11:06 AM  Clinical Narrative:    Spoke with pt's POA, Ms Henri via t/c. Ms Henri shares that pt is currently a LTC resident at [facility]. CSW confirmed this with Isaiah with [facility]. [facility] is currently working on obtaining Medicaid for pt to cover his LTC expenses. Pt's POA would like pt to be transitioned to a facility in [place]. CSW informed POA that until pt's Medicaid comes through it is unlikely to obtain LTC for pt at a different facility however, if he is recommended for STR referrals could be made to facilities in [place]. TOC will continue to follow.    Expected Discharge Plan: Skilled Nursing Facility Barriers to Discharge: Continued Medical Work up               Expected Discharge Plan and Services In-house Referral: Clinical Social Work Discharge Planning Services: CM Consult Post Acute Care Choice: Skilled Nursing Facility Living arrangements for the past 2 months: Skilled Nursing Facility                                       Social Drivers of Health (SDOH) Interventions SDOH Screenings   Food Insecurity: Patient Unable To Answer (12/28/2023)  Housing: Patient Unable To Answer (12/28/2023)  Transportation Needs: Patient Unable To Answer (12/28/2023)  Utilities: Patient Unable To Answer (12/28/2023)  Alcohol Screen: Low Risk  (02/12/2023)  Depression (PHQ2-9): Low Risk  (10/10/2023)  Recent Concern: Depression (PHQ2-9) - Medium Risk (09/26/2023)  Financial Resource Strain: Low Risk  (09/26/2023)  Physical Activity: Inactive (09/26/2023)  Social Connections: Patient Unable To Answer (12/28/2023)  Stress: No Stress Concern Present (09/26/2023)  Tobacco Use: Medium Risk (09/24/2023)  Health Literacy: Adequate Health  Literacy (03/07/2023)    Readmission Risk Interventions    12/29/2023    8:50 AM 09/24/2023    2:43 PM 02/27/2023   11:39 AM  Readmission Risk Prevention Plan  Transportation Screening Complete Complete Complete  PCP or Specialist Appt within 3-5 Days  Not Complete   HRI or Home Care Consult   Complete  Social Work Consult for Recovery Care Planning/Counseling  Complete Complete  Palliative Care Screening  Not Applicable Not Applicable  Medication Review Oceanographer) Complete Complete Complete  HRI or Home Care Consult Complete    SW Recovery Care/Counseling Consult Complete    Palliative Care Screening Not Applicable    Skilled Nursing Facility Complete

## 2023-12-29 NOTE — Progress Notes (Signed)
 Consulted for TEE in setting of bacteremia with prosthetic aortic valve. Will plan for TTE today, patient in septic shock on pressors, hold on TEE until clinically more stable  JINNY Ross MD

## 2023-12-29 NOTE — TOC Initial Note (Signed)
 Transition of Care [facility]) - Initial/Assessment Note    Patient Details  Name: Gary Carroll MRN: [id number] Date of Birth: Oct 19, 1945  Transition of Care [facility]) CM/SW Contact:    Lucie Lunger, LCSWA Phone Number: 12/29/2023, 8:52 AM  Clinical Narrative:                 Pt is high risk for readmission. CSW notes per chart review that pt arrived from [facility]. CSW reached out to [facility] in admissions to follow up if pt is short term or long term resident at facility, awaiting response. [facility] updated [facility] on [facility]. [facility] notification ID is [id number]. TOC to follow.   Expected Discharge Plan: Skilled Nursing Facility Barriers to Discharge: Continued Medical Work up   Patient Goals and CMS Choice Patient states their goals for this hospitalization and ongoing recovery are:: get better CMS Medicare.gov Compare Post Acute Care list provided to:: Patient Choice offered to / list presented to : Patient      Expected Discharge Plan and Services In-house Referral: Clinical Social Work Discharge Planning Services: CM Consult Post Acute Care Choice: Skilled Nursing Facility Living arrangements for the past 2 months: Skilled Nursing Facility                                      Prior Living Arrangements/Services Living arrangements for the past 2 months: Skilled Nursing Facility Lives with:: Facility Resident Patient language and need for interpreter reviewed:: Yes Do you feel safe going back to the place where you live?: Yes      Need for Family Participation in Patient Care: Yes (Comment) Care giver support system in place?: Yes (comment)   Criminal Activity/Legal Involvement Pertinent to Current Situation/Hospitalization: No - Comment as needed  Activities of Daily Living      Permission Sought/Granted                  Emotional Assessment Appearance:: Appears stated age Attitude/Demeanor/Rapport: Engaged Affect  (typically observed): Accepting Orientation: : Oriented to Self, Oriented to Place, Oriented to  Time, Oriented to Situation Alcohol / Substance Use: Not Applicable Psych Involvement: No (comment)  Admission diagnosis:  Sepsis (HCC) [A41.9] Patient Active Problem List   Diagnosis Date Noted   Sepsis (HCC) 12/28/2023   Adjustment disorder with depressed mood 09/26/2023   Problem related to unspecified psychosocial circumstances 09/26/2023   Sepsis secondary to UTI (HCC) 09/23/2023   COVID-19 08/21/2023   Transportation insecurity 03/07/2023   Presence of intraocular lens 03/07/2023   Other secondary cataract, right eye 03/07/2023   Osteopenia 03/07/2023   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 with unspecified severity (HCC) 03/07/2023   Hyperglycemia, unspecified 03/07/2023   Disease of anus and rectum, unspecified 03/07/2023   Chronic venous insufficiency 03/07/2023   Bilateral ankle joint pain 03/07/2023   Age-related nuclear cataract, bilateral 03/07/2023   CAD (coronary artery disease) 02/25/2023   (HFpEF) heart failure with preserved ejection fraction (HCC) 02/25/2023   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   Acute hypoxemic respiratory failure (HCC) 01/27/2023   Nonrheumatic aortic (valve) stenosis 01/27/2023   Primary adenocarcinoma of colon (HCC) 01/27/2023   Insomnia 01/27/2023   Elevated troponin 12/21/2022   Hemorrhoids 08/29/2022   Other fecal abnormalities 06/24/2022   Urethritis 05/01/2022   Candidiasis of penis 05/01/2022  Hardening of the aorta (main artery of the heart) (HCC) 04/29/2022   Complication of diabetes mellitus (HCC) 04/29/2022   Chronic obstructive pulmonary disease (HCC) 04/29/2022   Pressure injury of skin of sacral region 10/18/2021   Edema of both lower extremities 10/18/2021   Other urogenital candidiasis 09/29/2021   Dysuria 09/29/2021    Upper respiratory infection 07/05/2021   Restrictive lung disease 06/26/2021   Multiple nodules of lung 04/30/2021   Otalgia of left ear 04/04/2021   Acute otitis media 04/04/2021   Tinea cruris 03/19/2021   Chronic anticoagulation 03/14/2021   Aortic stenosis 03/14/2021   Pressure injury of skin of buttock 02/03/2021   Acute respiratory failure with hypoxia and hypercapnia (HCC)    Abnormal computed tomography scan 01/01/2021   History of tobacco use 10/23/2020   Sleep apnea 09/25/2020   Type 2 diabetes mellitus (HCC) 09/25/2020   Senile osteoporosis 09/25/2020   Retention of urine 09/25/2020   Morbid obesity (HCC) 09/25/2020   Chronic combined systolic and diastolic heart failure (HCC) 09/25/2020   Lower urinary tract symptoms due to benign prostatic hyperplasia 09/25/2020   Chronic postoperative pain 03/19/2018   Elevated transaminase level    Obstructive sleep apnea syndrome 10/01/2017   Benign prostatic hyperplasia with lower urinary tract symptoms 02/04/2017   Urinary tract infection 02/04/2017   Cellulitis of left lower extremity 02/03/2017   Gastroesophageal reflux disease without esophagitis 02/03/2017   AF (paroxysmal atrial fibrillation) (HCC) 02/03/2017   Bladder outlet obstruction 08/22/2015   Hydronephrosis determined by ultrasound 08/22/2015   AKI (acute kidney injury) (HCC) 08/22/2015   Hypernatremia 08/22/2015   Acute blood loss anemia 08/22/2015   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   Primary osteoarthritis of right knee 06/09/2015   Chronic pain of right knee 05/23/2015   Peripheral edema 08/31/2012   Urinary frequency 08/31/2012   Diverticulosis 03/23/2012   Impaired fasting glucose 02/06/2012   Decreased libido 03/07/2011   Erectile dysfunction 03/07/2011   Essential hypertension 12/20/2010   Hyperlipidemia, unspecified 12/20/2010   History of cellulitis 05/02/2010   PCP:  Clinic, [facility] Pharmacy:   [id number] -  [place],  [place] - [address] AT [facility] [address]  [place] [address] Phone: [phone] Fax: [phone]  [facility] - [place], [place] - [address] [place] [address] Phone: [phone] Fax: [phone]     Social Drivers of Health (SDOH) Social History: SDOH Screenings   Food Insecurity: Patient Unable To Answer (12/28/2023)  Housing: Patient Unable To Answer (12/28/2023)  Transportation Needs: Patient Unable To Answer (12/28/2023)  Utilities: Patient Unable To Answer (12/28/2023)  Alcohol Screen: Low Risk  (02/12/2023)  Depression (PHQ2-9): Low Risk  (10/10/2023)  Recent Concern: Depression (PHQ2-9) - Medium Risk (09/26/2023)  Financial Resource Strain: Low Risk  (09/26/2023)  Physical Activity: Inactive (09/26/2023)  Social Connections: Patient Unable To Answer (12/28/2023)  Stress: No Stress Concern Present (09/26/2023)  Tobacco Use: Medium Risk (09/24/2023)  Health Literacy: Adequate Health Literacy (03/07/2023)   SDOH Interventions:     Readmission Risk Interventions    12/29/2023    8:50 AM 09/24/2023    2:43 PM 02/27/2023   11:39 AM  Readmission Risk Prevention Plan  Transportation Screening Complete Complete Complete  PCP or Specialist Appt within 3-5 Days  Not Complete   HRI or Home Care Consult   Complete  Social Work Consult for Recovery Care Planning/Counseling  Complete Complete  Palliative Care Screening  Not Applicable  Not Applicable  Medication Review (RN Care Manager) Complete Complete Complete  HRI or Home Care Consult Complete    SW Recovery Care/Counseling Consult Complete    Palliative Care Screening Not Applicable    Skilled Nursing Facility Complete

## 2023-12-29 NOTE — Plan of Care (Signed)

## 2023-12-29 NOTE — Consult Note (Signed)
 [facility]                                                                                        Patient Identification: Patient Name: Gary Carroll MRN: [id number] Admit Date: 12/28/2023 11:35 AM Today's Date: 12/29/2023 Reason for consult: Bacteremia Requesting provider: Dr Gary Carroll  Location: Patient: Gary Carroll Provider: Darryle Carroll   Assessment and plan for this encounter is solely based on chart review as patient has not been seen in person.   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 Pneumonia in CXR -no respiratory symptoms documented, stable on 2 L nasal cannula  # Anal fissure/chronic wound rt leg-does not appear to be infected based on pictures in media, wound care and d/w primary team  # AKI resolved  Recommendations - Vancomycin ,  Zosyn  and azithromycin , will target antibiotics to Streptococcus group C with ceftriaxone  only  - 2 sets of repeat blood cultures ordered for tomorrow, fu urine cultures  - TTE ordered but will also need TEE due to TAVR - Monitor CBC, CMP - Monitor for metastatic sites of infection - Fluid, electrolytes and vasopressor management per primary - Universal/standard isolation precaution  Rest of the management as per the primary team. Please call with questions or concerns.  Thank you for the consult  __________________________________________________________________________________________________________ HPI and Hospital Course: 78 year old male with multiple comorbidities as below including h/o rectal cancer with chronic fissure, DM, A-fib, CAD, HLD, HTN, COPD on 2 L nasal cannula severe aortic stenosis s/p TAVR, bilateral knee arthroplasty, chronic rt leg wound, strep dysgalactiae bacteremia in 2018, E coli bacteremia/UTI in May 2025 who presented to the ED on 8/10 for fever, weakness and confusion.   At ED febrile, tachycardic Labs remarkable for K5.3, AKI with creatinine 1.54, albumin 2.6, AST 63, ALT 47, lactic acid 5.0, WBC 15.3 UA cloudy, moderate leukocytes, negative nitrite protein 30, RBC 11-20 and WBC more than 50, Urine cx pending Blood cx 8/10 2/2 sets Streptococcus group C MRSA PCR positive Received IVF, broad-spectrum antibiotics  Chest x-ray patchy retrocardiac airspace opacities likely atelectasis in the setting of low  lung volumes, additionally a developing bronchopneumonia could have this appearance in the cardiac clinical sign text.   ID consulted for Streptococcus bacteremia  ROS: unable to get as patient is critically ill, on pressors   Past Medical History:  Diagnosis Date   A-fib (HCC)    Allergic rhinitis    Arthritis    Asthma    seasonal per pt   Bilateral knee pain    OA   CAD (coronary artery disease)    Diabetes mellitus without complication (HCC)     Diverticulosis 02/2012   seen on colonoscopy   Erectile dysfunction    GERD (gastroesophageal reflux disease)    Hiatal hernia    HLD (hyperlipidemia)    HTN (hypertension)    Hydronephrosis determined by ultrasound 08/22/2015   Overactive bladder    Prediabetes    Pulmonary lesion    Ringing of ears    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 Wonda and Gary. Lucas   Severe aortic stenosis    Sleep apnea    Urinary frequency    Urinary urgency    Wears glasses    Past Surgical History:  Procedure Laterality Date   ABDOMINAL AORTOGRAM W/LOWER EXTREMITY N/A 02/05/2023   Procedure: ABDOMINAL AORTOGRAM W/LOWER EXTREMITY;  Surgeon: Gary Lurena POUR, MD;  [facility]: [facility];  Service: Cardiovascular;  Laterality: N/A;   CATARACT EXTRACTION W/PHACO Left 12/07/2021   Procedure: CATARACT EXTRACTION PHACO AND INTRAOCULAR LENS PLACEMENT (IOC);  Surgeon: Gary Agent, MD;  Location: [facility] ORS;  Service: Ophthalmology;  Laterality: Left;  CDE 7.75   CATARACT EXTRACTION W/PHACO Right 12/21/2021   Procedure: CATARACT EXTRACTION PHACO AND INTRAOCULAR LENS PLACEMENT (IOC);  Surgeon: Gary Agent, MD;  Location: [facility];  Service: Ophthalmology;  Laterality: Right;  CDE: 8.00   CHOLECYSTECTOMY     COLONOSCOPY  02/2012   Gary. Debrah: sigmoid, transverse, and ascending colon diverticulosis   COLONOSCOPY WITH PROPOFOL  N/A 01/13/2023   Procedure: COLONOSCOPY WITH PROPOFOL ;  Surgeon: Gary Carlin POUR, DO;  Location: [facility];  Service: Endoscopy;  Laterality: N/A;  1:00 pm, asa 3   FINGER SURGERY Right    INTRAOPERATIVE TRANSTHORACIC ECHOCARDIOGRAM N/A 02/25/2023   Procedure: INTRAOPERATIVE TRANSTHORACIC ECHOCARDIOGRAM;  Surgeon: Carroll Sharper, MD;  Location: [facility] INVASIVE CV LAB;  Service: Open Heart Surgery;  Laterality: N/A;   prostate procedure  2017   per patient, was having trouble urinating    RIGHT HEART CATH AND CORONARY  ANGIOGRAPHY N/A 02/05/2023   Procedure: RIGHT HEART CATH AND CORONARY ANGIOGRAPHY;  Surgeon: Gary Lurena POUR, MD;  Location: [facility];  Service: Cardiovascular;  Laterality: N/A;   TONSILLECTOMY     TOTAL KNEE ARTHROPLASTY  2012   left (Gary Carroll)   TOTAL KNEE ARTHROPLASTY Right 06/19/2015   Procedure: TOTAL KNEE ARTHROPLASTY;  Surgeon: Marcey Raman, MD;  Location: [facility] OR;  Service: Orthopedics;  Laterality: Right;   TRANSCATHETER AORTIC VALVE REPLACEMENT, TRANSFEMORAL N/A 02/25/2023   Procedure: Transcatheter Aortic Valve Replacement, Transfemoral;  Surgeon: Carroll Sharper, MD;  Location: [facility] INVASIVE CV LAB;  Service: Open Heart Surgery;  Laterality: N/A;    Scheduled Meds:  apixaban   5 mg Oral BID   Chlorhexidine  Gluconate Cloth  6 each Topical Q0600   clotrimazole -betamethasone   1 Application Topical BID   [START ON 12/30/2023] diltiazem   180 mg Oral Daily   lactobacillus  1 g Oral TID WC   leptospermum manuka honey  1  Application Topical Daily   liver oil-zinc  oxide   Topical BID   melatonin  9 mg Oral QHS   metoprolol  tartrate  50 mg Oral BID   midodrine   5 mg Oral TID WC   mupirocin  ointment   Nasal BID   oxybutynin   5 mg Oral QPM   sodium chloride  flush  3 mL Intravenous Q12H   sodium chloride  flush  3 mL Intravenous Q12H   Continuous Infusions:  sodium chloride      lactated ringers  100 mL/hr at 12/29/23 1000   norepinephrine  (LEVOPHED ) Adult infusion 4 mcg/min (12/29/23 1000)   piperacillin -tazobactam (ZOSYN )  IV Stopped (12/29/23 0808)   vancomycin      PRN Meds:.acetaminophen  **OR** acetaminophen , bisacodyl , hydrALAZINE , HYDROmorphone  (DILAUDID ) injection, ipratropium, levalbuterol , metoprolol  tartrate, ondansetron  **OR** ondansetron  (ZOFRAN ) IV, mouth rinse, oxyCODONE , senna-docusate, traZODone   Allergies  Allergen Reactions   Diovan [Valsartan] Cough   Tagamet [Cimetidine] Other (See Comments)    Irritability    Social History   Socioeconomic History    Marital status: Divorced    Spouse name: deceased   Number of children: 0   Years of education: Not on file   Highest education level: 12th grade  Occupational History   Occupation: retired Lobbyist)    Employer: RETIRED  Tobacco Use   Smoking status: Former    Current packs/day: 0.00    Types: Cigarettes    Quit date: 01/18/2010    Years since quitting: 13.9   Smokeless tobacco: Never   Tobacco comments:    Smoked for 25 years- quit September 2011.  Passive exposure from wife  Vaping Use   Vaping status: Never Used  Substance and Sexual Activity   Alcohol use: Not Currently    Alcohol/week: 1.0 - 2.0 standard drink of alcohol    Types: 1 - 2 Glasses of wine per week   Drug use: No   Sexual activity: Not on file  Other Topics Concern   Not on file  Social History Narrative   Separated from wife (2014) and moved to [place]. Retired- worked in [employer]   09/26/22 divorced twice, his last wife died, no children, no living siblings, has a few cousins  in their 68s and over 32 years old.    has been using [employer] recently to get food.    Prior illness he had been independent with ADLs/IADLs, even driving.   Social Drivers of Corporate investment banker Strain: Low Risk  (09/26/2023)   Overall Financial Resource Strain (CARDIA)    Difficulty of Paying Living Expenses: Not hard at all  Food Insecurity: Patient Unable To Answer (12/28/2023)   Hunger Vital Sign    Worried About Running Out of Food in the Last Year: Patient unable to answer    Ran Out of Food in the Last Year: Patient unable to answer  Transportation Needs: Patient Unable To Answer (12/28/2023)   PRAPARE - Transportation    Lack of Transportation (Medical): Patient unable to answer    Lack of Transportation (Non-Medical): Patient unable to answer  Physical Activity: Inactive (09/26/2023)   Exercise Vital Sign    Days of Exercise per Week: 0 days    Minutes of Exercise per Session: 0 min  Stress: No  Stress Concern Present (09/26/2023)   [employer] of Occupational Health - Occupational Stress Questionnaire    Feeling of Stress : Only a little  Social Connections: Patient Unable To Answer (12/28/2023)   Social Connection and Isolation Panel  Frequency of Communication with Friends and Family: Patient unable to answer    Frequency of Social Gatherings with Friends and Family: Patient unable to answer    Attends Religious Services: Patient unable to answer    Active Member of Clubs or Organizations: Patient unable to answer    Attends Banker Meetings: Patient unable to answer    Marital Status: Patient unable to answer  Intimate Partner Violence: Patient Unable To Answer (12/28/2023)   Humiliation, Afraid, Rape, and Kick questionnaire    Fear of Current or Ex-Partner: Patient unable to answer    Emotionally Abused: Patient unable to answer    Physically Abused: Patient unable to answer    Sexually Abused: Patient unable to answer   Family History  Problem Relation Age of Onset   Uterine cancer Mother    Cancer Mother        uterine   Coronary artery disease Father    Coronary artery disease Paternal Grandfather    Heart disease Paternal Grandfather    Colon cancer Neg Hx    Esophageal cancer Neg Hx    Stomach cancer Neg Hx    Rectal cancer Neg Hx    Liver disease Neg Hx     Vitals BP (!) 102/56   Pulse 65   Temp 98.1 F (36.7 C) (Bladder)   Resp 20   Ht 6' 2 (1.88 m)   Wt 93.2 kg   SpO2 95%   BMI 26.38 kg/m    Physical Exam Virtual visit  Pictures from 8/11 reviewed, does not appear to have active signs of infection in the left anterior leg and perirectal area           Pertinent Microbiology Results for orders placed or performed during the hospital encounter of 12/28/23  Resp panel by RT-PCR (RSV, Flu A&B, Covid) Anterior Nasal Swab     Status: None   Collection Time: 12/28/23 11:52 AM   Specimen: Anterior Nasal Swab  Result Value  Ref Range Status   SARS Coronavirus 2 by RT PCR NEGATIVE NEGATIVE Final    Comment: (NOTE) SARS-CoV-2 target nucleic acids are NOT DETECTED.  The SARS-CoV-2 RNA is generally detectable in upper respiratory specimens during the acute phase of infection. The lowest concentration of SARS-CoV-2 viral copies this assay can detect is 138 copies/mL. A negative result does not preclude SARS-Cov-2 infection and should not be used as the sole basis for treatment or other patient management decisions. A negative result may occur with  improper specimen collection/handling, submission of specimen other than nasopharyngeal swab, presence of viral mutation(s) within the areas targeted by this assay, and inadequate number of viral copies(<138 copies/mL). A negative result must be combined with clinical observations, patient history, and epidemiological information. The expected result is Negative.  Fact Sheet for Patients:  BloggerCourse.com  Fact Sheet for Healthcare Providers:  SeriousBroker.it  This test is no t yet approved or cleared by the [place]  FDA and  has been authorized for detection and/or diagnosis of SARS-CoV-2 by FDA under an Emergency Use Authorization (EUA). This EUA will remain  in effect (meaning this test can be used) for the duration of the COVID-19 declaration under Section 564(b)(1) of the Act, 21 U.S.C.section 360bbb-3(b)(1), unless the authorization is terminated  or revoked sooner.       Influenza A by PCR NEGATIVE NEGATIVE Final   Influenza B by PCR NEGATIVE NEGATIVE Final    Comment: (NOTE) The Xpert Xpress SARS-CoV-2/FLU/RSV plus assay is intended  as an aid in the diagnosis of influenza from Nasopharyngeal swab specimens and should not be used as a sole basis for treatment. Nasal washings and aspirates are unacceptable for Xpert Xpress SARS-CoV-2/FLU/RSV testing.  Fact Sheet for  Patients: BloggerCourse.com  Fact Sheet for Healthcare Providers: SeriousBroker.it  This test is not yet approved or cleared by the [place]  FDA and has been authorized for detection and/or diagnosis of SARS-CoV-2 by FDA under an Emergency Use Authorization (EUA). This EUA will remain in effect (meaning this test can be used) for the duration of the COVID-19 declaration under Section 564(b)(1) of the Act, 21 U.S.C. section 360bbb-3(b)(1), unless the authorization is terminated or revoked.     Resp Syncytial Virus by PCR NEGATIVE NEGATIVE Final    Comment: (NOTE) Fact Sheet for Patients: BloggerCourse.com  Fact Sheet for Healthcare Providers: SeriousBroker.it  This test is not yet approved or cleared by the [place]  FDA and has been authorized for detection and/or diagnosis of SARS-CoV-2 by FDA under an Emergency Use Authorization (EUA). This EUA will remain in effect (meaning this test can be used) for the duration of the COVID-19 declaration under Section 564(b)(1) of the Act, 21 U.S.C. section 360bbb-3(b)(1), unless the authorization is terminated or revoked.  Performed at [facility], [address] [place], [place] [address]   Blood Culture (routine x 2)     Status: Abnormal (Preliminary result)   Collection Time: 12/28/23 12:25 PM   Specimen: Right Antecubital; Blood  Result Value Ref Range Status   Specimen Description   Final    RIGHT ANTECUBITAL BOTTLES DRAWN AEROBIC AND ANAEROBIC Performed at [facility], [address], [place], [place] [address]    Special Requests   Final    Blood Culture adequate volume Performed at [facility], [address], [place], [place] [address]    Culture  Setup Time   Final    GRAM POSITIVE COCCI IN BOTH AEROBIC AND ANAEROBIC BOTTLES Gram Stain Report Called to,Read Back By and Verified With: C. KINDLEY ON 12/28/2023  @8 :20PM BY T.HAMER  Performed at [facility], [address], [place], [place] [address]    Culture STREPTOCOCCUS GROUP C (A)  Final   Report Status PENDING  Incomplete  Blood Culture (routine x 2)     Status: Abnormal (Preliminary result)   Collection Time: 12/28/23 12:25 PM   Specimen: Left Antecubital; Blood  Result Value Ref Range Status   Specimen Description   Final    LEFT ANTECUBITAL BOTTLES DRAWN AEROBIC AND ANAEROBIC Performed at [facility], [address] [place], [place] [address]    Special Requests   Final    Blood Culture adequate volume Performed at [facility], [address] [place], [place] [address]    Culture  Setup Time   Final    GRAM POSITIVE COCCI Gram Stain Report Called to,Read Back By and Verified With: KINDLEY,C @ 2020 ON 12/28/23 BY [facility] IN BOTH AEROBIC AND ANAEROBIC BOTTLES GS DONE @ [facility] CRITICAL RESULT CALLED TO, READ BACK BY AND VERIFIED WITH: C KINDLEY RN 12/29/2023 @ 0010 BY AB    Culture (A)  Final    STREPTOCOCCUS GROUP C SUSCEPTIBILITIES TO FOLLOW Performed at [facility], [address] [place], [place] [address]    Report Status PENDING  Incomplete  Blood Culture ID Panel (Reflexed)     Status: Abnormal   Collection Time: 12/28/23 12:25 PM  Result Value Ref Range Status   Enterococcus faecalis NOT DETECTED NOT DETECTED  Final   Enterococcus Faecium NOT DETECTED NOT DETECTED Final   Listeria monocytogenes NOT DETECTED NOT DETECTED Final   Staphylococcus species NOT DETECTED NOT DETECTED Final   Staphylococcus aureus (BCID) NOT DETECTED NOT DETECTED Final   Staphylococcus epidermidis NOT DETECTED NOT DETECTED Final   Staphylococcus lugdunensis NOT DETECTED NOT DETECTED Final   Streptococcus species DETECTED (A) NOT DETECTED Final    Comment: Not Enterococcus species, Streptococcus agalactiae, Streptococcus pyogenes, or Streptococcus pneumoniae. CRITICAL RESULT CALLED TO, READ BACK BY AND VERIFIED WITH: C KINDLEY RN 12/29/2023 @  0010 BY AB    Streptococcus agalactiae NOT DETECTED NOT DETECTED Final   Streptococcus pneumoniae NOT DETECTED NOT DETECTED Final   Streptococcus pyogenes NOT DETECTED NOT DETECTED Final   A.calcoaceticus-baumannii NOT DETECTED NOT DETECTED Final   Bacteroides fragilis NOT DETECTED NOT DETECTED Final   Enterobacterales NOT DETECTED NOT DETECTED Final   Enterobacter cloacae complex NOT DETECTED NOT DETECTED Final   Escherichia coli NOT DETECTED NOT DETECTED Final   Klebsiella aerogenes NOT DETECTED NOT DETECTED Final   Klebsiella oxytoca NOT DETECTED NOT DETECTED Final   Klebsiella pneumoniae NOT DETECTED NOT DETECTED Final   Proteus species NOT DETECTED NOT DETECTED Final   Salmonella species NOT DETECTED NOT DETECTED Final   Serratia marcescens NOT DETECTED NOT DETECTED Final   Haemophilus influenzae NOT DETECTED NOT DETECTED Final   Neisseria meningitidis NOT DETECTED NOT DETECTED Final   Pseudomonas aeruginosa NOT DETECTED NOT DETECTED Final   Stenotrophomonas maltophilia NOT DETECTED NOT DETECTED Final   Candida albicans NOT DETECTED NOT DETECTED Final   Candida auris NOT DETECTED NOT DETECTED Final   Candida glabrata NOT DETECTED NOT DETECTED Final   Candida krusei NOT DETECTED NOT DETECTED Final   Candida parapsilosis NOT DETECTED NOT DETECTED Final   Candida tropicalis NOT DETECTED NOT DETECTED Final   Cryptococcus neoformans/gattii NOT DETECTED NOT DETECTED Final    Comment: Performed at [facility], [address] [place], [place] [address]  MRSA Next Gen by PCR, Nasal     Status: Abnormal   Collection Time: 12/28/23  2:30 PM   Specimen: Nasal Mucosa; Nasal Swab  Result Value Ref Range Status   MRSA by PCR Next Gen DETECTED (A) NOT DETECTED Final    Comment: RESULT CALLED TO, READ BACK BY AND VERIFIED WITH: A SHELTON AT 1743 ON [id number] BY S DALTON (NOTE) The GeneXpert MRSA Assay (FDA approved for NASAL specimens only), is one component of a comprehensive MRSA  colonization surveillance program. It is not intended to diagnose MRSA infection nor to guide or monitor treatment for MRSA infections. Test performance is not FDA approved in patients less than 65 years old. Performed at [facility], [address] [place], [place] [address]    Pertinent Lab seen by me:    Latest Ref Rng & Units 12/29/2023    4:33 AM 12/28/2023   10:39 PM 12/28/2023   12:25 PM  CBC  WBC 4.0 - 10.5 K/uL 17.7  22.2  15.3   Hemoglobin 13.0 - 17.0 g/dL 9.3  9.2  88.0   Hematocrit 39.0 - 52.0 % 30.6  29.7  38.3   Platelets 150 - 400 K/uL 110  110  159       Latest Ref Rng & Units 12/29/2023    4:33 AM 12/28/2023   10:39 PM 12/28/2023   12:25 PM  CMP  Glucose 70 - 99 mg/dL 889  884  95   BUN 8 - 23 mg/dL 25  25  28   Creatinine 0.61 - 1.24 mg/dL 9.26  9.16  8.45   Sodium 135 - 145 mmol/L 136  135  136   Potassium 3.5 - 5.1 mmol/L 4.0  4.4  5.3   Chloride 98 - 111 mmol/L 109  106  102   CO2 22 - 32 mmol/L 20  18  19    Calcium  8.9 - 10.3 mg/dL 7.8  7.6  8.8   Total Protein 6.5 - 8.1 g/dL 5.0  4.9  6.7   Total Bilirubin 0.0 - 1.2 mg/dL 0.8  0.8  0.9   Alkaline Phos 38 - 126 U/L 87  84  123   AST 15 - 41 U/L 44  46  63   ALT 0 - 44 U/L 33  33  47     Pertinent Imagings/Other Imagings Plain films and CT images have been personally visualized and interpreted; radiology reports have been reviewed. Decision making incorporated into the Impression / Recommendations.  DG Chest Port 1 View Result Date: 12/28/2023 CLINICAL DATA:  Questionable sepsis - evaluate for abnormality EXAM: PORTABLE CHEST - 1 VIEW COMPARISON:  Sep 22, 2023 FINDINGS: Low lung volumes. Patchy retrocardiac airspace opacities. No pleural effusion or pneumothorax. Moderate cardiomegaly. 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 No acute fracture or destructive lesions. Multilevel thoracic osteophytosis. Osteopenia. IMPRESSION: Patchy retrocardiac airspace opacities,  likely atelectasis in the setting of low lung volumes. Alternatively, a developing bronchopneumonia could have this appearance in the correct clinical context. Electronically Signed   By: Rogelia Myers M.D.   On: 12/28/2023 12:41    I spent 81 minutes involved in face-to-face and non-face-to-face activities for this patient on the day of the visit. Professional time spent includes the following activities: Preparing to see the patient (review of tests), Obtaining and reviewing separately obtained history (EDP, H&P), Performing a medically appropriate evaluation , Ordering medications, referring and communicating with other health care professionals, Documenting clinical information in the EMR, Independently interpreting results (not separately reported) and Care coordination (not separately reported).  Electronically signed by:   Plan d/w requesting provider as well as ID pharm D  Of note, portions of this note may have been created with voice recognition software. While this note has been edited for accuracy, occasional wrong-word or 'sound-a-like' substitutions may have occurred due to the inherent limitations of voice recognition software.   Annalee Orem, MD Infectious Disease Physician [facility] Pager: [phone]

## 2023-12-29 NOTE — Progress Notes (Signed)
 PROGRESS NOTE    Patient: Gary Carroll                            PCP: [facility]                    DOB: 1946-01-12            DOA: 12/28/2023 FMW:[id number]             DOS: 12/29/2023, 10:55 AM   LOS: 1 day   Date of Service: The patient was seen and examined on 12/29/2023  Subjective:   The patient was seen and examined this morning, much improved mentation, afebrile still hypotensive, tachypneic and tachycardic Satting 95% room air Blood pressure overnight 89/56 Was started on Levophed  overnight blood pressure currently 104/66  Brief Narrative:   Gary Carroll is a 78 year old with extensive history of severe aortic stenosis s/p TAVR, paroxysmal A-fib on Eliquis , CAD, rectal adenocarcinoma, HTN, HLD, DM2, chronic heart failure HFpEF, presented to ED from nursing home with chief complaint of generalized weaknesses, fever, confusion.  Patient is poor historian at this point-electronic record review reveals patient has had multiple hospitalization for multiple comorbidities last discharged on 09/30/2023 after treatment for sepsis, UTI secondary to E. coli, Enterobacter, E. coli bacteremia COVID infection in April, E. coli sepsis.   ED evaluation: Vitals:   12/28/23 1303 12/28/23 1315  BP:  105/85  Pulse: (!) 123 (!) 123  Resp: 11 (!) 41  Temp: (!) 103.4 F (39.7 C) (!) 102.9 F (39.4 C)  SpO2: 98% 100%    Labs: CMP potassium 5.3, BUN 28, creatinine 1.54, albumin 2.6, AST 63 ALT 47, lactic acid 5.0 CBC WBC 15.3, hemoglobin 11.9, neutrophil 15.0, INR 2.4, Respiratory panel-influenza A, B, COVID-all negative UA amber, moderate leukocytes, negative for nitrites, bacteria none, WBC > 50  Chest x-ray:  Patchy retrocardiac airspace opacities, likely atelectasis in the setting of low lung volumes. Alternatively, a developing bronchopneumonia could have this appearance in the correct clinical context.   Requested patient to be admitted, as patient meets  sepsis criteria-likely source UTI with low probability of pneumonia  Sepsis protocol initiated, IV fluids, broad-spectrum antibiotics cultures been obtained    Assessment & Plan:   Active Problems:   Elevated transaminase level   (HFpEF) heart failure with preserved ejection fraction (HCC)   Sepsis (HCC)   Benign prostatic hyperplasia with lower urinary tract symptoms   Disease of anus and rectum, unspecified   Essential hypertension   Hyperlipidemia, unspecified   AKI (acute kidney injury) (HCC)   Gastroesophageal reflux disease without esophagitis   AF (paroxysmal atrial fibrillation) (HCC)   Urinary tract infection   Aortic stenosis   CAD (coronary artery disease)   S/P TAVR (transcatheter aortic valve replacement)   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   - Likely  urinary source, UTI,  cannot rule out pneumonia bases on CXr  POA: Met sepsis criteria (encephalopathic, hypotensive, tachycardia, febrile) -Continue to monitor on ICU setting   -Initiated sepsis protocol, aggressive IV fluid resuscitation, patient has been given Rocephin /azithromycin , will broaden to IV Cefepime  12/29/23 -ABX switched to vancomycin /Zosyn   - Consulting infectious disease team-appreciate further evaluation recommendation - Consulting cardiology-for possible TTE, versus TEE, with a history of TAVR  -Repeating  blood cultures  - Lactic acidosis-  LA 5.0   - Obtaining procalcitonin level - will monitor closely, continue IVF -Patient does have a history of CHF, with the fluid resuscitation we will monitor for fluid overload-in stepdown setting  Toxic metabolic encephalopathy  - Due to sepsis -infection - Mentation improving - Treating underlying causes    (HFpEF) heart failure with preserved ejection fraction (HCC) - POA euvolemic -Last Echo 12/ 2024 EF 60-65%, normal LV function moderate concentric LVH normal right ventricular systolic function, normal mitral and tricuspid valve repair replaced aortic valve  Due to sepsis we will continue IV fluid resuscitation  - Will monitor I's and O's, daily weight - Monitor closely  Intake/Output Summary (Last 24 hours) at 12/29/2023 1117 Last data filed at 12/29/2023 1000 Gross per 24 hour  Intake 8774.13 ml  Output 2600 ml  Net 6174.13 ml     Elevated transaminase level - Likely exacerbated by sepsis, on statins -Continue IV fluids, holding statins -Will avoid hepatotoxins    Latest Ref Rng & Units 12/29/2023    4:33 AM 12/28/2023   10:39 PM 12/28/2023   12:25 PM  Hepatic Function  Total Protein 6.5 - 8.1 g/dL 5.0  4.9  6.7   Albumin 3.5 - 5.0 g/dL 1.9  1.8  2.6   AST 15 - 41 U/L 44  46  63   ALT 0 - 44 U/L 33  33  47   Alk Phosphatase 38 - 126 U/L 87  84  123   Total Bilirubin 0.0 - 1.2 mg/dL 0.8  0.8  0.9      Disease of anus and rectum, unspecified - History of rectal adenocarcinoma with chronic anal fissure -Continue wound care -No signs of infection  Benign prostatic hyperplasia with lower urinary tract symptoms - Monitoring for urinary retention -Temporary Foley catheter -due to urinary retention, and open rectal wound -Resuming home meds  9 including oxybutynin  0  Adjustment disorder with depressed mood - Currently confused, -Med rec reviewed not on any antidepressant  Complication of diabetes mellitus (HCC) - Holding  home medication of metformin  -Checking CBG q. ACHS, SSI coverage -Check an A1c:  (last A1c 4 months ago 6.1)  Insomnia - Continue home medication melatonin -As needed trazodone   Morbid obesity (HCC) Body mass index is 34.17 kg/m. 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 with unspecified severity (HCC) - Continue wound care per nursing  S/P TAVR (transcatheter aortic valve replacement) - Stable continue meds  CAD (coronary artery disease) - Currently stable -Continue home medication including beta-blocker, statins, apixaban  calcium  channel  Aortic stenosis - History of severe aortic stenosis, s/p TAVR Successful TAVR with 26 mm Edwards SAPIEN 3 THV on 02/25/2023     AF (paroxysmal atrial fibrillation) (HCC) - Rate controlled with diltiazem , metoprolol  continuing apixaban  - Holding diltiazem , and metoprolol , due to hypotension  - utilizing as needed metoprolol  to control heart rate  Gastroesophageal reflux disease without esophagitis -  Continue PPI  AKI (acute kidney injury) (HCC) - Monitoring BUN/creatinine closely likely elevated due to sepsis, hypovolemia hypotension - Avoid nephrotoxins, hypotension - Continue IVF Lab Results  Component Value Date   CREATININE 0.73 12/29/2023   CREATININE 0.83 12/28/2023   CREATININE 1.54 (H) 12/28/2023     Hyperlipidemia, unspecified - on statins -holding rosuvastatin  For transaminitis - Will monitor LFTs  Essential hypertension -hypotensive - Currently in sepsis, ruling out septic shock -Holding BP medications including losartan     ----------------------------------------------------------------------------------------------------------------------- Nutritional status:  The patient's BMI is: Body mass index is 26.38 kg/m. I agree with the assessment and plan as outlined  Nutrition Status:        Skin Assessment: I have examined the patient's skin and I agree with the wound assessment as performed by wound care team As outlined  Media Information  Document Information --------------------------------------------------------------------------------------------------------------- Cultures; Blood Cultures x 2 >> group C strep  8/12 repeat blood cultures >>   Urine Culture  >>>  Sputum Culture >>   ------------------------------------------------------------------------------------------------------------------------------------------------  DVT prophylaxis:  SCDs Start: 12/28/23 1320 apixaban  (ELIQUIS ) tablet 5 mg   Code Status:   Code Status: Full Code  Family Communication: No family member present at bedside-  -Advance care planning has been discussed.   Admission status:   Status is: Inpatient Remains inpatient appropriate because: Needing ICU admission-due to hypotension meeting sepsis criteria, aggressive IV fluid resuscitation, broad-spectrum antibiotics   Disposition: From  - home             Planning for discharge in 1-2 days   Procedures:   No admission procedures for hospital encounter.   Antimicrobials:  Anti-infectives (From admission, onward)    Start     Dose/Rate Route Frequency Ordered Stop   12/29/23 2200  vancomycin  (VANCOREADY) IVPB 1500 mg/300 mL        1,500 mg 150 mL/hr over 120 Minutes Intravenous Every 24 hours 12/28/23 2027     12/29/23 0000  ceFEPIme  (MAXIPIME ) 2 g in sodium chloride  0.9 % 100 mL IVPB  Status:  Discontinued        2 g 200 mL/hr over 30 Minutes Intravenous Every 12 hours 12/28/23 1346 12/28/23 1348   12/28/23 2115  vancomycin  (VANCOREADY) IVPB 2000 mg/400 mL        2,000 mg 200 mL/hr over 120 Minutes Intravenous  Once 12/28/23 2024 12/29/23 0732   12/28/23 2000  ceFEPIme  (MAXIPIME ) 2 g in sodium chloride  0.9 % 100 mL IVPB  Status:  Discontinued        2 g 200 mL/hr over 30 Minutes Intravenous Every 12 hours  12/28/23 1348 12/28/23 1411   12/28/23 2000  piperacillin -tazobactam (ZOSYN ) IVPB 3.375 g        3.375 g 12.5 mL/hr over 240 Minutes Intravenous Every 8 hours 12/28/23 1411     12/28/23 1330  ceFEPIme  (MAXIPIME ) 2 g in sodium chloride  0.9 % 100 mL IVPB  Status:  Discontinued        2 g 200 mL/hr over 30 Minutes Intravenous  Once 12/28/23 1323 12/28/23 1346   12/28/23 1300  azithromycin  (ZITHROMAX ) 500 mg in sodium chloride  0.9 % 250 mL IVPB        500 mg 250 mL/hr over 60 Minutes Intravenous  Once 12/28/23 1251 12/28/23 1418   12/28/23 1200  cefTRIAXone  (ROCEPHIN ) 2 g in sodium chloride  0.9 % 100 mL IVPB        2 g 200 mL/hr over 30 Minutes Intravenous  Once 12/28/23 1152 12/28/23  1254        Medication:   apixaban   5 mg Oral BID   Chlorhexidine  Gluconate Cloth  6 each Topical Q0600   clotrimazole -betamethasone   1 Application Topical BID   [START ON 12/30/2023] diltiazem   180 mg Oral Daily   lactobacillus  1 g Oral TID WC   leptospermum manuka honey  1 Application Topical Daily   liver oil-zinc  oxide   Topical BID   melatonin  9 mg Oral QHS   metoprolol  tartrate  50 mg Oral BID   midodrine   5 mg Oral TID WC   mupirocin  ointment   Nasal BID   oxybutynin   5 mg Oral QPM   sodium chloride  flush  3 mL Intravenous Q12H   sodium chloride  flush  3 mL Intravenous Q12H    acetaminophen  **OR** acetaminophen , bisacodyl , hydrALAZINE , HYDROmorphone  (DILAUDID ) injection, ipratropium, levalbuterol , metoprolol  tartrate, ondansetron  **OR** ondansetron  (ZOFRAN ) IV, mouth rinse, oxyCODONE , senna-docusate, traZODone    Objective:   Vitals:   12/29/23 1000 12/29/23 1015 12/29/23 1030 12/29/23 1045  BP: (!) 90/50 (!) 89/56 (!) 90/54 104/66  Pulse: (!) 59 (!) 58 78 (!) 117  Resp: (!) 21 20 (!) 22 (!) 22  Temp:      TempSrc:      SpO2: 100% 100% 100% 95%  Weight:      Height:        Intake/Output Summary (Last 24 hours) at 12/29/2023 1055 Last data filed at 12/29/2023 1000 Gross per 24 hour   Intake 8774.13 ml  Output 2600 ml  Net 6174.13 ml   Filed Weights   12/28/23 1333 12/28/23 1457 12/29/23 0611  Weight: 111.1 kg 93.3 kg 93.2 kg     Physical examination:    General:  AAO x 2,  cooperative, no distress;   HEENT:  Normocephalic, PERRL, otherwise with in Normal limits   Neuro:  CNII-XII intact. , normal motor and sensation, reflexes intact   Lungs:   Clear to auscultation BL, Respirations unlabored,  No wheezes / crackles  Cardio:    S1/S2, RRR, No murmure, No Rubs or Gallops   Abdomen:  Soft, non-tender, bowel sounds active all four quadrants, no guarding or peritoneal signs.  Muscular  skeletal:  Bedbound Limited exam -global generalized weaknesses - in bed, able to move all 4 extremities,   2+ pulses,  symmetric, No pitting edema  Skin:  Dry, warm to touch, negative for any Rashes, P  Wounds: Rectal open wound - Pressure wounds per nursing    Media Information  Document Information     ----------------------------------------------------------------------------------------------------------------------------    LABs:     Latest Ref Rng & Units 12/29/2023    4:33 AM 12/28/2023   10:39 PM 12/28/2023   12:25 PM  CBC  WBC 4.0 - 10.5 K/uL 17.7  22.2  15.3   Hemoglobin 13.0 - 17.0 g/dL 9.3  9.2  88.0   Hematocrit 39.0 - 52.0 % 30.6  29.7  38.3   Platelets 150 - 400 K/uL 110  110  159       Latest Ref Rng & Units 12/29/2023    4:33 AM 12/28/2023   10:39 PM 12/28/2023   12:25 PM  CMP  Glucose 70 - 99 mg/dL 889  884  95   BUN 8 - 23 mg/dL 25  25  28    Creatinine 0.61 - 1.24 mg/dL 9.26  9.16  8.45   Sodium 135 - 145 mmol/L 136  135  136   Potassium 3.5 - 5.1  mmol/L 4.0  4.4  5.3   Chloride 98 - 111 mmol/L 109  106  102   CO2 22 - 32 mmol/L 20  18  19    Calcium  8.9 - 10.3 mg/dL 7.8  7.6  8.8   Total Protein 6.5 - 8.1 g/dL 5.0  4.9  6.7   Total Bilirubin 0.0 - 1.2 mg/dL 0.8  0.8  0.9   Alkaline Phos 38 - 126 U/L 87  84  123   AST 15 - 41 U/L 44   46  63   ALT 0 - 44 U/L 33  33  47        Micro Results Recent Results (from the past 240 hours)  Resp panel by RT-PCR (RSV, Flu A&B, Covid) Anterior Nasal Swab     Status: None   Collection Time: 12/28/23 11:52 AM   Specimen: Anterior Nasal Swab  Result Value Ref Range Status   SARS Coronavirus 2 by RT PCR NEGATIVE NEGATIVE Final    Comment: (NOTE) SARS-CoV-2 target nucleic acids are NOT DETECTED.  The SARS-CoV-2 RNA is generally detectable in upper respiratory specimens during the acute phase of infection. The lowest concentration of SARS-CoV-2 viral copies this assay can detect is 138 copies/mL. A negative result does not preclude SARS-Cov-2 infection and should not be used as the sole basis for treatment or other patient management decisions. A negative result may occur with  improper specimen collection/handling, submission of specimen other than nasopharyngeal swab, presence of viral mutation(s) within the areas targeted by this assay, and inadequate number of viral copies(<138 copies/mL). A negative result must be combined with clinical observations, patient history, and epidemiological information. The expected result is Negative.  Fact Sheet for Patients:  BloggerCourse.com  Fact Sheet for Healthcare Providers:  SeriousBroker.it  This test is no t yet approved or cleared by the [place]  FDA and  has been authorized for detection and/or diagnosis of SARS-CoV-2 by FDA under an Emergency Use Authorization (EUA). This EUA will remain  in effect (meaning this test can be used) for the duration of the COVID-19 declaration under Section 564(b)(1) of the Act, 21 U.S.C.section 360bbb-3(b)(1), unless the authorization is terminated  or revoked sooner.       Influenza A by PCR NEGATIVE NEGATIVE Final   Influenza B by PCR NEGATIVE NEGATIVE Final    Comment: (NOTE) The Xpert Xpress SARS-CoV-2/FLU/RSV plus assay is  intended as an aid in the diagnosis of influenza from Nasopharyngeal swab specimens and should not be used as a sole basis for treatment. Nasal washings and aspirates are unacceptable for Xpert Xpress SARS-CoV-2/FLU/RSV testing.  Fact Sheet for Patients: BloggerCourse.com  Fact Sheet for Healthcare Providers: SeriousBroker.it  This test is not yet approved or cleared by the [place]  FDA and has been authorized for detection and/or diagnosis of SARS-CoV-2 by FDA under an Emergency Use Authorization (EUA). This EUA will remain in effect (meaning this test can be used) for the duration of the COVID-19 declaration under Section 564(b)(1) of the Act, 21 U.S.C. section 360bbb-3(b)(1), unless the authorization is terminated or revoked.     Resp Syncytial Virus by PCR NEGATIVE NEGATIVE Final    Comment: (NOTE) Fact Sheet for Patients: BloggerCourse.com  Fact Sheet for Healthcare Providers: SeriousBroker.it  This test is not yet approved or cleared by the [place]  FDA and has been authorized for detection and/or diagnosis of SARS-CoV-2 by FDA under an Emergency Use Authorization (EUA). This EUA will remain in effect (meaning this test can  be used) for the duration of the COVID-19 declaration under Section 564(b)(1) of the Act, 21 U.S.C. section 360bbb-3(b)(1), unless the authorization is terminated or revoked.  Performed at [facility], [address] [place], [place] [address]   Blood Culture (routine x 2)     Status: Abnormal (Preliminary result)   Collection Time: 12/28/23 12:25 PM   Specimen: Right Antecubital; Blood  Result Value Ref Range Status   Specimen Description   Final    RIGHT ANTECUBITAL BOTTLES DRAWN AEROBIC AND ANAEROBIC Performed at [facility], [address] [place], [place] [address]    Special Requests   Final    Blood Culture adequate  volume Performed at [facility], [address], [place], [place] [address]    Culture  Setup Time   Final    GRAM POSITIVE COCCI IN BOTH AEROBIC AND ANAEROBIC BOTTLES Gram Stain Report Called to,Read Back By and Verified With: C. KINDLEY ON 12/28/2023 @8 :20PM BY T.HAMER  Performed at [facility], [address] [place], [place] [address]    Culture STREPTOCOCCUS GROUP C (A)  Final   Report Status PENDING  Incomplete  Blood Culture (routine x 2)     Status: Abnormal (Preliminary result)   Collection Time: 12/28/23 12:25 PM   Specimen: Left Antecubital; Blood  Result Value Ref Range Status   Specimen Description   Final    LEFT ANTECUBITAL BOTTLES DRAWN AEROBIC AND ANAEROBIC Performed at [facility], [address] [place], [place] [address]    Special Requests   Final    Blood Culture adequate volume Performed at [facility], [address], [place], [place] [address]    Culture  Setup Time   Final    GRAM POSITIVE COCCI Gram Stain Report Called to,Read Back By and Verified With: KINDLEY,C @ 2020 ON 12/28/23 BY [facility] IN BOTH AEROBIC AND ANAEROBIC BOTTLES GS DONE @ [facility] CRITICAL RESULT CALLED TO, READ BACK BY AND VERIFIED WITH: C KINDLEY RN 12/29/2023 @ 0010 BY AB    Culture (A)  Final    STREPTOCOCCUS GROUP C SUSCEPTIBILITIES TO FOLLOW Performed at [facility], [address] [place], [place] [address]    Report Status PENDING  Incomplete  Blood Culture ID Panel (Reflexed)     Status: Abnormal   Collection Time: 12/28/23 12:25 PM  Result Value Ref Range Status   Enterococcus faecalis NOT DETECTED NOT DETECTED Final   Enterococcus Faecium NOT DETECTED NOT DETECTED Final   Listeria monocytogenes NOT DETECTED NOT DETECTED Final   Staphylococcus species NOT DETECTED NOT DETECTED Final   Staphylococcus aureus (BCID) NOT DETECTED NOT DETECTED Final   Staphylococcus epidermidis NOT DETECTED NOT DETECTED Final   Staphylococcus lugdunensis NOT DETECTED NOT DETECTED Final    Streptococcus species DETECTED (A) NOT DETECTED Final    Comment: Not Enterococcus species, Streptococcus agalactiae, Streptococcus pyogenes, or Streptococcus pneumoniae. CRITICAL RESULT CALLED TO, READ BACK BY AND VERIFIED WITH: C KINDLEY RN 12/29/2023 @ 0010 BY AB    Streptococcus agalactiae NOT DETECTED NOT DETECTED Final   Streptococcus pneumoniae NOT DETECTED NOT DETECTED Final   Streptococcus pyogenes NOT DETECTED NOT DETECTED Final   A.calcoaceticus-baumannii NOT DETECTED NOT DETECTED Final   Bacteroides fragilis NOT DETECTED NOT DETECTED Final   Enterobacterales NOT DETECTED NOT DETECTED Final   Enterobacter cloacae complex NOT DETECTED NOT DETECTED Final   Escherichia coli NOT DETECTED NOT DETECTED Final   Klebsiella aerogenes NOT DETECTED NOT DETECTED Final   Klebsiella oxytoca NOT DETECTED NOT DETECTED  Final   Klebsiella pneumoniae NOT DETECTED NOT DETECTED Final   Proteus species NOT DETECTED NOT DETECTED Final   Salmonella species NOT DETECTED NOT DETECTED Final   Serratia marcescens NOT DETECTED NOT DETECTED Final   Haemophilus influenzae NOT DETECTED NOT DETECTED Final   Neisseria meningitidis NOT DETECTED NOT DETECTED Final   Pseudomonas aeruginosa NOT DETECTED NOT DETECTED Final   Stenotrophomonas maltophilia NOT DETECTED NOT DETECTED Final   Candida albicans NOT DETECTED NOT DETECTED Final   Candida auris NOT DETECTED NOT DETECTED Final   Candida glabrata NOT DETECTED NOT DETECTED Final   Candida krusei NOT DETECTED NOT DETECTED Final   Candida parapsilosis NOT DETECTED NOT DETECTED Final   Candida tropicalis NOT DETECTED NOT DETECTED Final   Cryptococcus neoformans/gattii NOT DETECTED NOT DETECTED Final    Comment: Performed at [facility], [address], [place], [place] [address]  MRSA Next Gen by PCR, Nasal     Status: Abnormal   Collection Time: 12/28/23  2:30 PM   Specimen: Nasal Mucosa; Nasal Swab  Result Value Ref Range Status   MRSA by PCR Next Gen  DETECTED (A) NOT DETECTED Final    Comment: RESULT CALLED TO, READ BACK BY AND VERIFIED WITH: A SHELTON AT 1743 ON [id number] BY S DALTON (NOTE) The GeneXpert MRSA Assay (FDA approved for NASAL specimens only), is one component of a comprehensive MRSA colonization surveillance program. It is not intended to diagnose MRSA infection nor to guide or monitor treatment for MRSA infections. Test performance is not FDA approved in patients less than 46 years old. Performed at [facility], [address] [place], [place] [address]     Radiology Reports DG Chest [place]ble 1 View Result Date: 12/28/2023 CLINICAL DATA:  Questionable sepsis - evaluate for abnormality EXAM: PORTABLE CHEST - 1 VIEW COMPARISON:  Sep 22, 2023 FINDINGS: Low lung volumes. Patchy retrocardiac airspace opacities. No pleural effusion or pneumothorax. Moderate cardiomegaly. 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 No acute fracture or destructive lesions. Multilevel thoracic osteophytosis. Osteopenia. IMPRESSION: Patchy retrocardiac airspace opacities, likely atelectasis in the setting of low lung volumes. Alternatively, a developing bronchopneumonia could have this appearance in the correct clinical context. Electronically Signed   By: Rogelia Myers M.D.   On: 12/28/2023 12:41    SIGNED: Adriana DELENA Grams, MD, FHM. FAAFP. [facility] - [facility] Critical care time spent - 55 min.  In seeing, evaluating and examining the patient. Reviewing medical records, labs, drawn plan of care. [facility],  Pager (please use [email or web address] to page/ text) Please use Epic Secure Chat for non-urgent communication (7AM-7PM)  If 7PM-7AM, please contact night-coverage [email or web address], 12/29/2023, 10:55 AM

## 2023-12-29 NOTE — Consult Note (Signed)
 Consultation Note Date: 12/29/2023   Patient Name: Gary Carroll  DOB: 1945/08/23  MRN: [id number]  Age / Sex: 78 y.o., male  PCP: [facility] Referring Physician: Willette Adriana LABOR, MD  Reason for Consultation: Establishing goals of care  HPI/Patient Profile: 78 y.o. male  with past medical history of severe aortic stenosis s/p TAVR, paroxysmal A-fib on Eliquis , CAD, rectal adenocarcinoma, HTN, HLD, DM2, chronic heart failure HFpEF admitted on 12/28/2023 with generalized weakness, confusion, and fever.  Workup revealed findings concerning for sepsis. He was admitted to stepdown and treated with IV fluids and IV abx.  Currently requiring vasopressor support.  Patient has had declining health over the past few months.  He has had recurrent hospitalizations with 3 in the past 6 months.  He is also required skilled nursing facility rehabilitation stays and unfortunately has not been able to recover enough function to return home safely.  PMT has been consulted to assist with goals of care conversation.  Today, interval labs and diagnostics reviewed.  CMP reveals stable renal function.  Mildly low CO2 levels.  Low albumin and total protein levels and minimally elevated AST of questionable significance.  BNP was elevated but improved compared to prior.  White blood cell count elevated but improved from prior day (17.7 current compared to 22.2 12/28/2023).  Anemic with a hemoglobin in the 9 range.  Platelet count low at 110.  Chest x-ray independently reviewed.  Some opacities present and what appears to be low lung volumes.  I see he has IV Dilaudid  as needed for pain but has not required any since admission.  He did receive 5 mg of oxycodone  on 12/28/2023.  Tachycardia and some intermittent hypotension noted.  Patient states he is tired.  Otherwise denies any significant symptoms.  History is limited due to his fatigue and distractibility.  Independent  history obtained from nursing staff.  They state that they have been trying to titrate down his Levophed  but his blood pressure will drop so he is on low-dose pressor support.  They do note that he intermittently appears more cognizant at some times than others and has been sleeping quite a bit.  Clinical Assessment and Goals of Care:  I have reviewed medical records including EPIC notes, labs and imaging (independently reviewed), prior hospital encounters, outpatient specialist notes, medication administration record, vital signs, assessed the patient and then discussed diagnosis prognosis, GOC, EOL wishes, disposition and options with patient.  I introduced Palliative Medicine as specialized medical care for people living with serious illness. It focuses on providing relief from the symptoms and stress of a serious illness. The goal is to improve quality of life for both the patient and the family.  We discussed a brief life review of the patient and then focused on their current illness.   I attempted to elicit values and goals of care important to the patient.    Medical History Review and Family/Patient Understanding:    Patient has limited insight into his current condition.  He he talks of his cancer diagnosis and wishes to know the status of this and discusses his difficulty following up with the [facility].  He does share that he has noticed his health has declined a lot over the past few months and he has not been able to do the things he used to do like swimming and traveling.  Social History:  Prior to the declines in his health status, he used to live at home alone.  He had no  children.  He does not have a spouse at present.  His only family is very elderly and unable to assist him.  He does have a few friends who help him out.  He served in [employer] for 4 years and spent time in [place] which instilled in him a love of traveling.    Functional and Nutritional State:   Since all of the  recent illnesses, he has required skilled nursing facility stay for rehabilitation.  Unfortunately, he seems to describe limited progress.  He does state that he spends most of his time in the bed but with therapy he may be able to ambulate with a walker.  He reports a fairly good appetite.  Palliative Symptoms:  Fatigue Pain  Advance Directives:  A detailed discussion regarding advanced directives was had.  We talked of his recent health struggles and the implications they have as relates to his prognosis and likelihood for further decline. We discussed that while we are hopeful we will be able to stabilize him and discharge him back to a facility for rehab where hopefully he will be able to regain strength, it is very likely that his health will continue to deteriorate and therefore important to think about what care he may or may not want going forward.  We discussed who would speak on his behalf if he would be unable to speak for himself.  He shares that one of his friends Dorisann Point) would assist in making medical decisions.  He indicates that he is never really spoken to her about his goals and wishes for future care.  I encouraged him to do so.  We talked about what quality of life means to him.  He states that being functional and not dependent completely on others would be important to him.  He would consider further treatments including oncologic treatment as well as aggressive hospital care if it meant prolonging his life. We discussed the limitations and potential burdens of CPR and intubation, particularly in the context of advanced age and serious underlying health conditions.  He is not sure about this.  He does share that he feels his quality of life is poor right now and he wants to be functional and not bedbound but he states he also does not want to die.  He indicates he is becoming tired and wishes to think more about our discussion before making any medical decisions.  He also  asked that I speak to his friend [place] by phone. Discussed the importance of continued conversation with family and the medical providers regarding overall plan of care and treatment options, ensuring decisions are within the context of the patient's values and GOCs.    Code Status:  Full code  Discussion:  I spoke with patient's friend [place] by phone extensively.  She shares about her role in the patient's life and his recent health declines.  I then discussed my concerns as it relates to his health and long-term prognosis.  She verbalizes understanding.  However, she indicates that she does not feel comfortable being his surrogate decision maker.  She is also spoken to their other friend Bruna Apple who also declines to make health decisions.  She promised his aunt that she would help him and if she absolutely had to she would make decisions but she prefers that he make all his decisions at this time.  Questions and concerns were addressed.  The patient and friend was encouraged to call with questions or concerns.  PMT  will continue to support holistically.   PATIENT    SUMMARY OF RECOMMENDATIONS    Maintain code/full scope for the time being Ongoing goals of care discussion Continue current symptom management regimen Would benefit from outpatient palliative care at discharge Inpatient palliative team will continue to follow for ongoing goals of care and symptom management   Code Status/Advance Care Planning: Full code   Symptom Management:  Continue Dilaudid  0.5 to 1 mg every 2 hours as needed (none needed since admission) Continue oxycodone  5 mg every 4 hours as needed (only one dose needed since admission) Continue Tylenol  650 mg every 6 hours as needed (only one dose needed since admission) Continue current bowel regimen (none needed since admission) Continue Zofran  4 mg every 6 hours as needed (none needed since admission) Continue trazodone  25 mg nightly as needed  (none needed since admission)  Palliative Prophylaxis:  Delirium Protocol  Additional Recommendations (Limitations, Scope, Preferences): Full Scope Treatment  Prognosis:  Guarded  Discharge Planning: To Be Determined      Primary Diagnoses: Present on Admission:  Sepsis (HCC)  (HFpEF) heart failure with preserved ejection fraction (HCC)  Adjustment disorder with depressed mood  AF (paroxysmal atrial fibrillation) (HCC)  AKI (acute kidney injury) (HCC)  (Resolved) Aortic valve disorder  Benign prostatic hyperplasia with lower urinary tract symptoms  CAD (coronary artery disease)  Complication of diabetes mellitus (HCC)  Essential hypertension  Gastroesophageal reflux disease without esophagitis  Hyperlipidemia, unspecified  Insomnia  Morbid obesity (HCC)  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  Pulmonary:     Effort: Pulmonary effort is normal. No respiratory distress.  Skin:    General: Skin is warm and dry.  Neurological:     Mental Status: He is lethargic.     Comments: Easily distracted     Vital Signs: BP 104/66   Pulse (!) 117   Temp 98.1 F (36.7 C) (Bladder)   Resp (!) 22   Ht 6' 2 (1.88 m)   Wt 93.2 kg   SpO2 95%   BMI 26.38 kg/m  Pain Scale: 0-10   Pain Score: 0-No pain   SpO2: SpO2: 95 % O2 Device:SpO2: 95 % O2 Flow Rate: .O2 Flow Rate (L/min): 2 L/min   Palliative Assessment/Data: 40-50%     Billing based on MDM: High  Problems Addressed: One acute or chronic illness or injury that poses a threat to life or bodily function  Amount and/or Complexity of Data: Category 1:Assessment requiring an independent historian(s) and Category 2:Independent  interpretation of a test performed by another physician/other qualified health care professional (not separately reported)  Risks: Parenteral controlled substances   Laymon CHRISTELLA Pinal, NP  Palliative Medicine Team Team phone # [phone]  Thank you for allowing the Palliative Medicine Team to assist in the care of this patient. Please utilize secure chat with additional questions, if there is no response within 30 minutes please call the above phone number.  Palliative Medicine Team providers are available by phone from 7am to 7pm daily and can be reached through the team cell phone.  Should this patient require assistance outside of these hours, please call the patient's attending physician.

## 2023-12-29 NOTE — ED Provider Notes (Signed)
 ED provider note:  I was called to the floor to place a central line.  This patient is apparently septic and requiring pressors.  They are at a maximum peripheral dose and need central venous access.  Patient consents to the procedure.  CENTRAL LINE Performed by: Vicenta Able Consent: The procedure was performed in an emergent situation. Required items: required blood products, implants, devices, and special equipment available Patient identity confirmed: arm band and provided demographic data Time out: Immediately prior to procedure a time out was called to verify the correct patient, procedure, equipment, support staff and site/side marked as required. Indications: vascular access Anesthesia: local infiltration Local anesthetic: lidocaine  1% with epinephrine  Anesthetic total: 3 ml Patient sedated: no Preparation: skin prepped with 2% chlorhexidine  Skin prep agent dried: skin prep agent completely dried prior to procedure Sterile barriers: all five maximum sterile barriers used - cap, mask, sterile gown, sterile gloves, and large sterile sheet Hand hygiene: hand hygiene performed prior to central venous catheter insertion  Location details: left femoral  Catheter type: triple lumen Catheter size: 8 Fr Pre-procedure: landmarks identified Ultrasound guidance: not needed Successful placement: yes Post-procedure: line sutured and dressing applied Assessment: blood return through all parts, free fluid flow Patient tolerance: Patient tolerated the procedure well with no immediate complications.    Able Vicenta, MD 12/29/23 [phone]

## 2023-12-29 NOTE — Progress Notes (Signed)
 OT Cancellation Note  Patient Details Name: Gary Carroll MRN: [id number] DOB: February 07, 1946   Cancelled Treatment:    Reason Eval/Treat Not Completed: Medical issues which prohibited therapy. RN suggested that pt should not get up due to new femoral line. Will attempt evaluation later as time permits and pt is more medically appropriate.   Davine Coba OT, MOT   Jayson Person 12/29/2023, 11:20 AM

## 2023-12-30 ENCOUNTER — Inpatient Hospital Stay (HOSPITAL_COMMUNITY)

## 2023-12-30 DIAGNOSIS — I959 Hypotension, unspecified: Secondary | ICD-10-CM | POA: Diagnosis present

## 2023-12-30 DIAGNOSIS — K629 Disease of anus and rectum, unspecified: Secondary | ICD-10-CM | POA: Diagnosis not present

## 2023-12-30 DIAGNOSIS — Z952 Presence of prosthetic heart valve: Secondary | ICD-10-CM | POA: Diagnosis not present

## 2023-12-30 DIAGNOSIS — R7881 Bacteremia: Secondary | ICD-10-CM | POA: Diagnosis present

## 2023-12-30 DIAGNOSIS — A419 Sepsis, unspecified organism: Secondary | ICD-10-CM | POA: Diagnosis not present

## 2023-12-30 DIAGNOSIS — Z515 Encounter for palliative care: Secondary | ICD-10-CM | POA: Diagnosis not present

## 2023-12-30 DIAGNOSIS — Z7189 Other specified counseling: Secondary | ICD-10-CM | POA: Diagnosis not present

## 2023-12-30 DIAGNOSIS — I5032 Chronic diastolic (congestive) heart failure: Secondary | ICD-10-CM | POA: Diagnosis not present

## 2023-12-30 DIAGNOSIS — B955 Unspecified streptococcus as the cause of diseases classified elsewhere: Secondary | ICD-10-CM | POA: Diagnosis present

## 2023-12-30 DIAGNOSIS — E872 Acidosis, unspecified: Secondary | ICD-10-CM | POA: Diagnosis present

## 2023-12-30 DIAGNOSIS — R6521 Severe sepsis with septic shock: Secondary | ICD-10-CM | POA: Diagnosis not present

## 2023-12-30 DIAGNOSIS — I872 Venous insufficiency (chronic) (peripheral): Secondary | ICD-10-CM

## 2023-12-30 LAB — CBC
HCT: 27.7 % — ABNORMAL LOW (ref 39.0–52.0)
Hemoglobin: 8.7 g/dL — ABNORMAL LOW (ref 13.0–17.0)
MCH: 30.3 pg (ref 26.0–34.0)
MCHC: 31.4 g/dL (ref 30.0–36.0)
MCV: 96.5 fL (ref 80.0–100.0)
Platelets: 85 K/uL — ABNORMAL LOW (ref 150–400)
RBC: 2.87 MIL/uL — ABNORMAL LOW (ref 4.22–5.81)
RDW: 16.5 % — ABNORMAL HIGH (ref 11.5–15.5)
WBC: 7.4 K/uL (ref 4.0–10.5)
nRBC: 0 % (ref 0.0–0.2)

## 2023-12-30 LAB — COMPREHENSIVE METABOLIC PANEL WITH GFR
ALT: 31 U/L (ref 0–44)
AST: 49 U/L — ABNORMAL HIGH (ref 15–41)
Albumin: 1.7 g/dL — ABNORMAL LOW (ref 3.5–5.0)
Alkaline Phosphatase: 82 U/L (ref 38–126)
Anion gap: 7 (ref 5–15)
BUN: 19 mg/dL (ref 8–23)
CO2: 23 mmol/L (ref 22–32)
Calcium: 8.1 mg/dL — ABNORMAL LOW (ref 8.9–10.3)
Chloride: 106 mmol/L (ref 98–111)
Creatinine, Ser: 0.57 mg/dL — ABNORMAL LOW (ref 0.61–1.24)
GFR, Estimated: >60 mL/min (ref >60)
Glucose, Bld: 84 mg/dL (ref 70–99)
Potassium: 3.5 mmol/L (ref 3.5–5.1)
Sodium: 136 mmol/L (ref 135–145)
Total Bilirubin: 0.5 mg/dL (ref 0.0–1.2)
Total Protein: 4.7 g/dL — ABNORMAL LOW (ref 6.5–8.1)

## 2023-12-30 LAB — CULTURE, BLOOD (ROUTINE X 2)
Special Requests: ADEQUATE
Special Requests: ADEQUATE

## 2023-12-30 LAB — IRON AND TIBC
Iron: 40 ug/dL — ABNORMAL LOW (ref 45–182)
Saturation Ratios: 39 % (ref 17.9–39.5)
TIBC: 103 ug/dL — ABNORMAL LOW (ref 250–450)
UIBC: 63 ug/dL

## 2023-12-30 LAB — ECHOCARDIOGRAM COMPLETE
AR max vel: 2.32 cm2
AV Area VTI: 2.33 cm2
AV Area mean vel: 2.1 cm2
AV Mean grad: 6 mmHg
AV Peak grad: 11 mmHg
Ao pk vel: 1.66 m/s
Area-P 1/2: 6.32 cm2
Est EF: >75
Height: 74 in
S' Lateral: 2.3 cm
Weight: 3287.5 [oz_av]

## 2023-12-30 LAB — VITAMIN B12: Vitamin B-12: 332 pg/mL (ref 180–914)

## 2023-12-30 LAB — GLUCOSE, CAPILLARY: Glucose-Capillary: 83 mg/dL (ref 70–99)

## 2023-12-30 LAB — FOLATE: Folate: 11.8 ng/mL (ref >5.9)

## 2023-12-30 LAB — BRAIN NATRIURETIC PEPTIDE: B Natriuretic Peptide: 273 pg/mL — ABNORMAL HIGH (ref 0.0–100.0)

## 2023-12-30 MED ORDER — DILTIAZEM HCL ER COATED BEADS 180 MG PO CP24: 180.0000 mg | ORAL_CAPSULE | Freq: Every day | ORAL | Status: DC

## 2023-12-30 MED ORDER — ENSURE PLUS HIGH PROTEIN PO LIQD
237.0000 mL | Freq: Two times a day (BID) | ORAL | Status: DC
  Administered 2023-12-31 (×2): 237 mL via ORAL

## 2023-12-30 MED ORDER — PERFLUTREN LIPID MICROSPHERE
1.0000 mL | INTRAVENOUS | Status: AC | PRN
  Administered 2023-12-30 (×2): 2 mL via INTRAVENOUS

## 2023-12-30 MED ORDER — METOPROLOL TARTRATE 25 MG PO TABS
25.0000 mg | ORAL_TABLET | Freq: Two times a day (BID) | ORAL | Status: DC
  Administered 2023-12-30 – 2024-01-06 (×19): 25 mg via ORAL
  Filled 2023-12-30 (×17): qty 1

## 2023-12-30 NOTE — Progress Notes (Signed)
 PT Cancellation Note  Patient Details Name: Gary Carroll MRN: [id number] DOB: 1945/07/14   Cancelled Treatment:    Reason Eval/Treat Not Completed: Medical issues which prohibited therapy. Patient has femoral line and will wait until removed.   11:56 AM, 12/30/23 Lynwood Music, MPT Physical Therapist with [facility] [phone] office [phone] mobile phone

## 2023-12-30 NOTE — Progress Notes (Addendum)
 PROGRESS NOTE    Patient: Gary Carroll                            PCP: [facility]                    DOB: 07/31/1945            DOA: 12/28/2023 FMW:[id number]             DOS: 12/30/2023, 1:22 PM   LOS: 2 days   Date of Service: The patient was seen and examined on 12/30/2023  Subjective:   The patient was seen and examined this morning, much more awake alert oriented Still tachypneic, tachycardic, blood pressures improved-on Levophed  and midodrine   Brief Narrative:   Gary Carroll is a 78 year old with extensive history of severe aortic stenosis s/p TAVR, paroxysmal A-fib on Eliquis , CAD, rectal adenocarcinoma, HTN, HLD, DM2, chronic heart failure HFpEF, presented to ED from nursing home with chief complaint of generalized weaknesses, fever, confusion.  Patient is poor historian at this point-electronic record review reveals patient has had multiple hospitalization for multiple comorbidities last discharged on 09/30/2023 after treatment for sepsis, UTI secondary to E. coli, Enterobacter, E. coli bacteremia COVID infection in April, E. coli sepsis.   ED evaluation: Vitals:   12/28/23 1303 12/28/23 1315  BP:  105/85  Pulse: (!) 123 (!) 123  Resp: 11 (!) 41  Temp: (!) 103.4 F (39.7 C) (!) 102.9 F (39.4 C)  SpO2: 98% 100%    Labs: CMP potassium 5.3, BUN 28, creatinine 1.54, albumin 2.6, AST 63 ALT 47, lactic acid 5.0 CBC WBC 15.3, hemoglobin 11.9, neutrophil 15.0, INR 2.4, Respiratory panel-influenza A, B, COVID-all negative UA amber, moderate leukocytes, negative for nitrites, bacteria none, WBC > 50  Chest x-ray:  Patchy retrocardiac airspace opacities, likely atelectasis in the setting of low lung volumes. Alternatively, a developing bronchopneumonia could have this appearance in the correct clinical context.   Requested patient to be admitted, as patient meets sepsis criteria-likely source UTI with low probability of pneumonia  Sepsis protocol  initiated, IV fluids, broad-spectrum antibiotics cultures been obtained    Assessment & Plan:   Principal Problem:   Septic shock (HCC) Active Problems:   Elevated transaminase level   (HFpEF) heart failure with preserved ejection fraction (HCC)   Sepsis (HCC)   Hypotensive episode   Bacteremia   Lactic acidosis   Benign prostatic hyperplasia with lower urinary tract symptoms   Disease of anus and rectum, unspecified   Essential hypertension   Hyperlipidemia, unspecified   AKI (acute kidney injury) (HCC)   Gastroesophageal reflux disease without esophagitis   AF (paroxysmal atrial fibrillation) (HCC)   Urinary tract infection   Aortic stenosis   CAD (coronary artery disease)   S/P TAVR (transcatheter aortic valve replacement)   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 with unspecified severity (HCC)   Morbid obesity (HCC)   Insomnia   Chronic venous insufficiency   Complication of diabetes mellitus (HCC)   Sepsis secondary to UTI (HCC)   Adjustment disorder with depressed mood     Assessment and Plan: Sepsis (HCC) - UTI - bacteremia -Source of infection likely UTI with bacteremia (could not rule out PNA) -Septic shock -with persistent hypotension,  - Continue IVF, Levophed , midodrine , IV antibiotics  -Tapering off Levophed  maintaining MAP of 65  Bacteremia -Blood cultures growing group C strep -Repeating blood cultures  POA: Met sepsis criteria (encephalopathic, hypotensive, tachycardia, febrile) -Continue to monitor on ICU setting   -Initiated sepsis protocol, aggressive IV fluid resuscitation, patient has been given Rocephin /azithromycin , will broaden to IV Cefepime  12/29/23 -ABX switched to vancomycin /Zosyn   --Consulted infectious disease team -currently recommendations is to focus on bacteremia -IV Rocephin  repeat cultures -Recommended TTE - Consulting cardiology-for possible TTE, versus TEE, with a history of TAVR  Once  patient is stable, off Levophed  has to go to [facility] for TEE and back to [facility]    -POA: Lactic acidosis-  LA 5.0  >>> 6.3   >>> 1.7 - Procalcitonin 27.0  -Patient does have a history of CHF, with the fluid resuscitation was monitor to avoid  overload-in stepdown setting  Toxic metabolic encephalopathy  -Much improved mentation, back to baseline - Due to sepsis -infection     (HFpEF) heart failure with preserved ejection fraction (HCC) - POA euvolemic -Last Echo 12/ 2024 EF 60-65%, normal LV function moderate concentric LVH normal right ventricular systolic function, normal mitral and tricuspid valve repair replaced aortic valve  Due to sepsis we will continue IV fluid resuscitation  - Will monitor I's and O's, daily weight - Monitor closely  Intake/Output Summary (Last 24 hours) at 12/30/2023 1322 Last data filed at 12/30/2023 9164 Gross per 24 hour  Intake 2330.86 ml  Output 300 ml  Net 2030.86 ml     Elevated transaminase level - Likely exacerbated by sepsis, on statins -Continue IV fluids, holding statins -Will avoid hepatotoxins    Latest Ref Rng & Units 12/30/2023    5:05 AM 12/29/2023    4:33 AM 12/28/2023   10:39 PM  Hepatic Function  Total Protein 6.5 - 8.1 g/dL 4.7  5.0  4.9   Albumin 3.5 - 5.0 g/dL 1.7  1.9  1.8   AST 15 - 41 U/L 49  44  46   ALT 0 - 44 U/L 31  33  33   Alk Phosphatase 38 - 126 U/L 82  87  84   Total Bilirubin 0.0 - 1.2 mg/dL 0.5  0.8  0.8      Disease of anus and rectum, unspecified - History of rectal adenocarcinoma with chronic anal fissure -Continue wound care -No signs of infection  Benign prostatic hyperplasia with lower urinary tract symptoms - Monitoring for urinary retention -Temporary Foley catheter -due to urinary retention, and open rectal wound -Resuming home meds  9 including oxybutynin  0  Adjustment disorder with depressed mood - Currently confused, -Med rec reviewed not on any antidepressant  Complication of diabetes  mellitus (HCC) - Holding home medication of metformin  -Checking CBG q. ACHS, SSI coverage -Check an A1c:  (last A1c 4 months ago 6.1)  Insomnia - Continue home medication melatonin -As needed trazodone   Morbid obesity (HCC) Body mass index is 34.17 kg/m. 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 with unspecified severity (HCC) - Continue wound care per nursing  S/P TAVR (transcatheter aortic valve replacement) - Stable continue meds  CAD (coronary artery disease) - Currently stable -Continue home medication including beta-blocker, statins, apixaban  calcium  channel  Aortic stenosis - History of severe aortic stenosis, s/p TAVR Successful TAVR with 26 mm Edwards SAPIEN 3 THV on 02/25/2023     AF (paroxysmal atrial fibrillation) (HCC) - Rate controlled with diltiazem , metoprolol  continuing apixaban  - Holding diltiazem , and metoprolol , due to hypotension  - utilizing  as needed metoprolol  to control heart rate  Gastroesophageal reflux disease without esophagitis - Continue PPI  AKI (acute kidney injury) (HCC) - Monitoring BUN/creatinine closely likely elevated due to sepsis, hypovolemia hypotension - Avoid nephrotoxins, hypotension - Continue IVF Lab Results  Component Value Date   CREATININE 0.57 (L) 12/30/2023   CREATININE 0.73 12/29/2023   CREATININE 0.83 12/28/2023     Hyperlipidemia, unspecified - on statins -holding rosuvastatin  For transaminitis - Will monitor LFTs  Essential hypertension -hypotensive - was in septic shock -Hypotensive on Levophed  -Holding BP medications including losartan     ----------------------------------------------------------------------------------------------------------------------- Nutritional status:  The patient's BMI is: Body mass index is 26.38 kg/m. I agree with the assessment and plan as outlined   Nutrition Status:       Skin Assessment: I have examined the patient's skin and I agree with the wound assessment as performed by wound care team As outlined  Media Information  Document Information --------------------------------------------------------------------------------------------------------------- Cultures; Blood Cultures x 2 >> group C strep  8/12 repeat blood cultures >>   Urine Culture  >>>  Sputum Culture >>   ------------------------------------------------------------------------------------------------------------------------------------------------  DVT prophylaxis:  SCDs Start: 12/28/23 1320 apixaban  (ELIQUIS ) tablet 5 mg   Code Status:   Code Status: Full Code  Family Communication: No family member present at bedside-  -Advance care planning has been discussed.   Admission status:   Status is: Inpatient Remains inpatient appropriate because: Needing ICU admission-due to hypotension meeting sepsis criteria, aggressive IV fluid resuscitation, broad-spectrum antibiotics   Disposition: From  - home             Planning for discharge in 1-2 days   Procedures:   No admission procedures for hospital encounter.   Antimicrobials:  Anti-infectives (From admission, onward)    Start     Dose/Rate Route Frequency Ordered Stop   12/29/23 2200  vancomycin  (VANCOREADY) IVPB 1500 mg/300 mL  Status:  Discontinued        1,500 mg 150 mL/hr over 120 Minutes Intravenous Every 24 hours 12/28/23 2027 12/29/23 1205   12/29/23 1800  cefTRIAXone  (ROCEPHIN ) 2 g in sodium chloride  0.9 % 100 mL IVPB        2 g 200 mL/hr over 30 Minutes Intravenous Every 24 hours 12/29/23 1207     12/29/23 0000  ceFEPIme  (MAXIPIME ) 2 g in sodium chloride  0.9 % 100 mL IVPB  Status:  Discontinued        2 g 200 mL/hr over 30 Minutes Intravenous Every 12 hours 12/28/23 1346 12/28/23 1348   12/28/23 2115  vancomycin  (VANCOREADY) IVPB 2000 mg/400 mL        2,000 mg 200 mL/hr over 120  Minutes Intravenous  Once 12/28/23 2024 12/29/23 0732   12/28/23 2000  ceFEPIme  (MAXIPIME ) 2 g in sodium chloride  0.9 % 100 mL IVPB  Status:  Discontinued        2 g 200 mL/hr over 30 Minutes Intravenous Every 12 hours 12/28/23 1348 12/28/23 1411   12/28/23 2000  piperacillin -tazobactam (ZOSYN ) IVPB 3.375 g  Status:  Discontinued        3.375 g 12.5 mL/hr over 240 Minutes Intravenous Every 8 hours 12/28/23 1411 12/29/23 1207   12/28/23 1330  ceFEPIme  (MAXIPIME ) 2 g in sodium chloride  0.9 % 100 mL IVPB  Status:  Discontinued        2 g 200 mL/hr over 30 Minutes Intravenous  Once 12/28/23 1323 12/28/23 1346   12/28/23 1300  azithromycin  (ZITHROMAX ) 500 mg in sodium chloride  0.9 %  250 mL IVPB        500 mg 250 mL/hr over 60 Minutes Intravenous  Once 12/28/23 1251 12/28/23 1418   12/28/23 1200  cefTRIAXone  (ROCEPHIN ) 2 g in sodium chloride  0.9 % 100 mL IVPB        2 g 200 mL/hr over 30 Minutes Intravenous  Once 12/28/23 1152 12/28/23 1254        Medication:   apixaban   5 mg Oral BID   Chlorhexidine  Gluconate Cloth  6 each Topical Q0600   clotrimazole -betamethasone   1 Application Topical BID   [START ON 12/31/2023] diltiazem   180 mg Oral Daily   lactobacillus  1 g Oral TID WC   leptospermum manuka honey  1 Application Topical Daily   liver oil-zinc  oxide   Topical BID   melatonin  9 mg Oral QHS   metoprolol  tartrate  25 mg Oral BID   midodrine   5 mg Oral TID WC   mupirocin  ointment   Nasal BID   oxybutynin   5 mg Oral QPM   sodium chloride  flush  3 mL Intravenous Q12H   sodium chloride  flush  3 mL Intravenous Q12H    acetaminophen  **OR** acetaminophen , bisacodyl , HYDROmorphone  (DILAUDID ) injection, ipratropium, levalbuterol , metoprolol  tartrate, ondansetron  **OR** ondansetron  (ZOFRAN ) IV, mouth rinse, oxyCODONE , senna-docusate, traZODone    Objective:   Vitals:   12/30/23 0900 12/30/23 1000 12/30/23 1100 12/30/23 1200  BP: 125/70 (!) 103/51 102/66 (!) 132/90  Pulse: (!) 102 87  82 (!) 109  Resp: (!) 25 (!) 21 (!) 23 (!) 25  Temp:      TempSrc:      SpO2: 98% 99% 98% 99%  Weight:      Height:        Intake/Output Summary (Last 24 hours) at 12/30/2023 1322 Last data filed at 12/30/2023 0835 Gross per 24 hour  Intake 2330.86 ml  Output 300 ml  Net 2030.86 ml   Filed Weights   12/28/23 1333 12/28/23 1457 12/29/23 0611  Weight: 111.1 kg 93.3 kg 93.2 kg     Physical examination:   General:  AAO x 3,  cooperative, no distress;   HEENT:  Normocephalic, PERRL, otherwise with in Normal limits   Neuro:  CNII-XII intact. , normal motor and sensation, reflexes intact   Lungs:   Clear to auscultation BL, Respirations unlabored,  No wheezes / crackles  Cardio:    S1/S2, RRR, No murmure, No Rubs or Gallops   Abdomen:  Soft, non-tender, bowel sounds active all four quadrants, no guarding or peritoneal signs.  Muscular  skeletal:  Limited exam -bedbound-severe global generalized weaknesses - in bed, able to move all 4 extremities,   2+ pulses,  symmetric, +1  pitting edema  Skin:  Dry, warm to touch,    Wounds: Rectal open wound - Pressure wounds per nursing    Media Information  Document Information     ----------------------------------------------------------------------------------------------------------------------------    LABs:     Latest Ref Rng & Units 12/30/2023    5:05 AM 12/29/2023    4:33 AM 12/28/2023   10:39 PM  CBC  WBC 4.0 - 10.5 K/uL 7.4  17.7  22.2   Hemoglobin 13.0 - 17.0 g/dL 8.7  9.3  9.2   Hematocrit 39.0 - 52.0 % 27.7  30.6  29.7   Platelets 150 - 400 K/uL 85  110  110       Latest Ref Rng & Units 12/30/2023    5:05 AM 12/29/2023    4:33 AM 12/28/2023  10:39 PM  CMP  Glucose 70 - 99 mg/dL 84  889  884   BUN 8 - 23 mg/dL 19  25  25    Creatinine 0.61 - 1.24 mg/dL 9.42  9.26  9.16   Sodium 135 - 145 mmol/L 136  136  135   Potassium 3.5 - 5.1 mmol/L 3.5  4.0  4.4   Chloride 98 - 111 mmol/L 106  109  106   CO2 22 - 32  mmol/L 23  20  18    Calcium  8.9 - 10.3 mg/dL 8.1  7.8  7.6   Total Protein 6.5 - 8.1 g/dL 4.7  5.0  4.9   Total Bilirubin 0.0 - 1.2 mg/dL 0.5  0.8  0.8   Alkaline Phos 38 - 126 U/L 82  87  84   AST 15 - 41 U/L 49  44  46   ALT 0 - 44 U/L 31  33  33        Micro Results Recent Results (from the past 240 hours)  Resp panel by RT-PCR (RSV, Flu A&B, Covid) Anterior Nasal Swab     Status: None   Collection Time: 12/28/23 11:52 AM   Specimen: Anterior Nasal Swab  Result Value Ref Range Status   SARS Coronavirus 2 by RT PCR NEGATIVE NEGATIVE Final    Comment: (NOTE) SARS-CoV-2 target nucleic acids are NOT DETECTED.  The SARS-CoV-2 RNA is generally detectable in upper respiratory specimens during the acute phase of infection. The lowest concentration of SARS-CoV-2 viral copies this assay can detect is 138 copies/mL. A negative result does not preclude SARS-Cov-2 infection and should not be used as the sole basis for treatment or other patient management decisions. A negative result may occur with  improper specimen collection/handling, submission of specimen other than nasopharyngeal swab, presence of viral mutation(s) within the areas targeted by this assay, and inadequate number of viral copies(<138 copies/mL). A negative result must be combined with clinical observations, patient history, and epidemiological information. The expected result is Negative.  Fact Sheet for Patients:  BloggerCourse.com  Fact Sheet for Healthcare Providers:  SeriousBroker.it  This test is no t yet approved or cleared by the [place]  FDA and  has been authorized for detection and/or diagnosis of SARS-CoV-2 by FDA under an Emergency Use Authorization (EUA). This EUA will remain  in effect (meaning this test can be used) for the duration of the COVID-19 declaration under Section 564(b)(1) of the Act, 21 U.S.C.section 360bbb-3(b)(1), unless the  authorization is terminated  or revoked sooner.       Influenza A by PCR NEGATIVE NEGATIVE Final   Influenza B by PCR NEGATIVE NEGATIVE Final    Comment: (NOTE) The Xpert Xpress SARS-CoV-2/FLU/RSV plus assay is intended as an aid in the diagnosis of influenza from Nasopharyngeal swab specimens and should not be used as a sole basis for treatment. Nasal washings and aspirates are unacceptable for Xpert Xpress SARS-CoV-2/FLU/RSV testing.  Fact Sheet for Patients: BloggerCourse.com  Fact Sheet for Healthcare Providers: SeriousBroker.it  This test is not yet approved or cleared by the [place]  FDA and has been authorized for detection and/or diagnosis of SARS-CoV-2 by FDA under an Emergency Use Authorization (EUA). This EUA will remain in effect (meaning this test can be used) for the duration of the COVID-19 declaration under Section 564(b)(1) of the Act, 21 U.S.C. section 360bbb-3(b)(1), unless the authorization is terminated or revoked.     Resp Syncytial Virus by PCR NEGATIVE NEGATIVE Final  Comment: (NOTE) Fact Sheet for Patients: BloggerCourse.com  Fact Sheet for Healthcare Providers: SeriousBroker.it  This test is not yet approved or cleared by the [place]  FDA and has been authorized for detection and/or diagnosis of SARS-CoV-2 by FDA under an Emergency Use Authorization (EUA). This EUA will remain in effect (meaning this test can be used) for the duration of the COVID-19 declaration under Section 564(b)(1) of the Act, 21 U.S.C. section 360bbb-3(b)(1), unless the authorization is terminated or revoked.  Performed at [facility], [address], [place], [place] [address]   Urine Culture     Status: Abnormal (Preliminary result)   Collection Time: 12/28/23 11:53 AM   Specimen: Urine, Random  Result Value Ref Range Status   Specimen Description   Final     URINE, RANDOM Performed at [facility], [address], [place], [place] [address]    Special Requests   Final    NONE Reflexed from [id number] Performed at [facility], [address] [place], [place] [address]    Culture 40,000 COLONIES/mL YEAST (A)  Final   Report Status PENDING  Incomplete  Blood Culture (routine x 2)     Status: Abnormal   Collection Time: 12/28/23 12:25 PM   Specimen: Right Antecubital; Blood  Result Value Ref Range Status   Specimen Description   Final    RIGHT ANTECUBITAL BOTTLES DRAWN AEROBIC AND ANAEROBIC Performed at [facility], [address], [place], [place] [address]    Special Requests   Final    Blood Culture adequate volume Performed at [facility], [address] [place], [place] [address]    Culture  Setup Time   Final    GRAM POSITIVE COCCI IN BOTH AEROBIC AND ANAEROBIC BOTTLES Gram Stain Report Called to,Read Back By and Verified With: C. KINDLEY ON 12/28/2023 @8 :20PM BY T.HAMER  Performed at [facility], [address] [place], [place] [address]    Culture (A)  Final    STREPTOCOCCUS GROUP C SUSCEPTIBILITIES PERFORMED ON PREVIOUS CULTURE WITHIN THE LAST 5 DAYS. Performed at [facility], [address], [place], [place] [address]    Report Status 12/30/2023 FINAL  Final  Blood Culture (routine x 2)     Status: Abnormal   Collection Time: 12/28/23 12:25 PM   Specimen: Left Antecubital; Blood  Result Value Ref Range Status   Specimen Description   Final    LEFT ANTECUBITAL BOTTLES DRAWN AEROBIC AND ANAEROBIC Performed at [facility], [address], [place], [place] [address]    Special Requests   Final    Blood Culture adequate volume Performed at [facility], [address] [place], [place] [address]    Culture  Setup Time   Final    GRAM POSITIVE COCCI Gram Stain Report Called to,Read Back By and Verified With: KINDLEY,C @ 2020 ON 12/28/23 BY [facility] IN BOTH AEROBIC AND ANAEROBIC BOTTLES GS DONE @ [facility] CRITICAL RESULT CALLED  TO, READ BACK BY AND VERIFIED WITHBETHA JAYSON LINGER RN 12/29/2023 @ 0010 BY AB Performed at [facility], [address], [place], [place] [address]    Culture STREPTOCOCCUS GROUP C (A)  Final   Report Status 12/30/2023 FINAL  Final   Organism ID, Bacteria STREPTOCOCCUS GROUP C  Final      Susceptibility   Streptococcus group c - MIC*    CLINDAMYCIN RESISTANT Resistant     AMPICILLIN <=0.25 SENSITIVE Sensitive     ERYTHROMYCIN >=8 RESISTANT Resistant     VANCOMYCIN   0.5 SENSITIVE Sensitive     CEFTRIAXONE  <=0.12 SENSITIVE Sensitive     LEVOFLOXACIN 0.5 SENSITIVE Sensitive     PENICILLIN  <=0.06 SENSITIVE Sensitive     * STREPTOCOCCUS GROUP C  Blood Culture ID Panel (Reflexed)     Status: Abnormal   Collection Time: 12/28/23 12:25 PM  Result Value Ref Range Status   Enterococcus faecalis NOT DETECTED NOT DETECTED Final   Enterococcus Faecium NOT DETECTED NOT DETECTED Final   Listeria monocytogenes NOT DETECTED NOT DETECTED Final   Staphylococcus species NOT DETECTED NOT DETECTED Final   Staphylococcus aureus (BCID) NOT DETECTED NOT DETECTED Final   Staphylococcus epidermidis NOT DETECTED NOT DETECTED Final   Staphylococcus lugdunensis NOT DETECTED NOT DETECTED Final   Streptococcus species DETECTED (A) NOT DETECTED Final    Comment: Not Enterococcus species, Streptococcus agalactiae, Streptococcus pyogenes, or Streptococcus pneumoniae. CRITICAL RESULT CALLED TO, READ BACK BY AND VERIFIED WITH: C KINDLEY RN 12/29/2023 @ 0010 BY AB    Streptococcus agalactiae NOT DETECTED NOT DETECTED Final   Streptococcus pneumoniae NOT DETECTED NOT DETECTED Final   Streptococcus pyogenes NOT DETECTED NOT DETECTED Final   A.calcoaceticus-baumannii NOT DETECTED NOT DETECTED Final   Bacteroides fragilis NOT DETECTED NOT DETECTED Final   Enterobacterales NOT DETECTED NOT DETECTED Final   Enterobacter cloacae complex NOT DETECTED NOT DETECTED Final   Escherichia coli NOT DETECTED NOT DETECTED Final    Klebsiella aerogenes NOT DETECTED NOT DETECTED Final   Klebsiella oxytoca NOT DETECTED NOT DETECTED Final   Klebsiella pneumoniae NOT DETECTED NOT DETECTED Final   Proteus species NOT DETECTED NOT DETECTED Final   Salmonella species NOT DETECTED NOT DETECTED Final   Serratia marcescens NOT DETECTED NOT DETECTED Final   Haemophilus influenzae NOT DETECTED NOT DETECTED Final   Neisseria meningitidis NOT DETECTED NOT DETECTED Final   Pseudomonas aeruginosa NOT DETECTED NOT DETECTED Final   Stenotrophomonas maltophilia NOT DETECTED NOT DETECTED Final   Candida albicans NOT DETECTED NOT DETECTED Final   Candida auris NOT DETECTED NOT DETECTED Final   Candida glabrata NOT DETECTED NOT DETECTED Final   Candida krusei NOT DETECTED NOT DETECTED Final   Candida parapsilosis NOT DETECTED NOT DETECTED Final   Candida tropicalis NOT DETECTED NOT DETECTED Final   Cryptococcus neoformans/gattii NOT DETECTED NOT DETECTED Final    Comment: Performed at [facility], [address], [place], [place] [address]  MRSA Next Gen by PCR, Nasal     Status: Abnormal   Collection Time: 12/28/23  2:30 PM   Specimen: Nasal Mucosa; Nasal Swab  Result Value Ref Range Status   MRSA by PCR Next Gen DETECTED (A) NOT DETECTED Final    Comment: RESULT CALLED TO, READ BACK BY AND VERIFIED WITH: A SHELTON AT 1743 ON [id number] BY S DALTON (NOTE) The GeneXpert MRSA Assay (FDA approved for NASAL specimens only), is one component of a comprehensive MRSA colonization surveillance program. It is not intended to diagnose MRSA infection nor to guide or monitor treatment for MRSA infections. Test performance is not FDA approved in patients less than 53 years old. Performed at [facility], [address], [place], [place] [address]   Culture, blood (Routine X 2) w Reflex to ID Panel     Status: None (Preliminary result)   Collection Time: 12/29/23 11:31 PM   Specimen: BLOOD  Result Value Ref Range Status   Specimen  Description BLOOD LEFT ANTECUBITAL  Final   Special Requests   Final    BOTTLES DRAWN AEROBIC AND ANAEROBIC  Blood Culture adequate volume   Culture   Final    NO GROWTH < 12 HOURS Performed at [facility], [address], [place], [place] [address]    Report Status PENDING  Incomplete  Culture, blood (Routine X 2) w Reflex to ID Panel     Status: None (Preliminary result)   Collection Time: 12/29/23 11:32 PM   Specimen: BLOOD LEFT HAND  Result Value Ref Range Status   Specimen Description BLOOD LEFT HAND  Final   Special Requests   Final    AEROBIC BOTTLE ONLY Blood Culture results may not be optimal due to an inadequate volume of blood received in culture bottles   Culture   Final    NO GROWTH < 12 HOURS Performed at [facility], [address] [place], [place] [address]    Report Status PENDING  Incomplete    Radiology Reports ECHOCARDIOGRAM COMPLETE Result Date: 12/30/2023    ECHOCARDIOGRAM REPORT   Patient Name:   TALLY MCKINNON Date of Exam: 12/30/2023 Medical Rec #:  [id number]          Height:       74.0 in Accession #:    [id number]         Weight:       205.5 lb Date of Birth:  Oct 09, 1945         BSA:          2.199 m Patient Age:    77 years           BP:           122/82 mmHg Patient Gender: M                  HR:           123 bpm. Exam Location:  [name] Procedure: 2D Echo, Cardiac Doppler, Color Doppler and Intracardiac            Opacification Agent (Both Spectral and Color Flow Doppler were            utilized during procedure). Indications:    Bacteremia R78.81  History:        Patient has prior history of Echocardiogram examinations, most                 recent 02/26/2023. CAD, Aortic Valve Disease, Arrythmias:Atrial                 Fibrillation; Risk Factors:Hypertension, Diabetes and Sleep                 Apnea.                 Aortic Valve: 26 mm Edwards Sapien prosthetic, stented (TAVR)                 valve is present in the aortic position. Procedure Date:                  02/25/23.  Sonographer:    Jayson Gaskins Referring Phys: [id number] DORN FALCON BRANCH  Sonographer Comments: Technically difficult study due to poor echo windows. IMPRESSIONS  1. Left ventricular ejection fraction, by estimation, is >75%. The left ventricle has hyperdynamic function. The left ventricle has no regional wall motion abnormalities. Left ventricular diastolic parameters are indeterminate.  2. RV not well visualized, grossly appears normal in size and function. . Right ventricular systolic function was not well visualized. The right ventricular size is not well visualized.  3. The  mitral valve was not well visualized. No evidence of mitral valve regurgitation. No evidence of mitral stenosis.  4. The aortic valve was not well visualized. Aortic valve regurgitation is not visualized. No aortic stenosis is present. 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 Procedure Date: 02/25/23.  5. IVC is small suggesting low RA pressure and hypovolemia. FINDINGS  Left Ventricle: Left ventricular ejection fraction, by estimation, is >75%. The left ventricle has hyperdynamic function. The left ventricle has no regional wall motion abnormalities. Definity  contrast agent was given IV to delineate the left ventricular endocardial borders. The left ventricular internal cavity size was normal in size. There is no left ventricular hypertrophy. Left ventricular diastolic parameters are indeterminate. Right Ventricle: RV not well visualized, grossly appears normal in size and function. The right ventricular size is not well visualized. Right vetricular wall thickness was not well visualized. Right ventricular systolic function was not well visualized. Left Atrium: Left atrial size was normal in size. Right Atrium: Right atrial size was not well visualized. Pericardium: There is no evidence of pericardial effusion. Mitral Valve: The mitral valve was not well visualized. No evidence  of mitral valve regurgitation. No evidence of mitral valve stenosis. Tricuspid Valve: The tricuspid valve is not well visualized. Tricuspid valve regurgitation is trivial. No evidence of tricuspid stenosis. Aortic Valve: The aortic valve was not well visualized. Aortic valve regurgitation is not visualized. No aortic stenosis is present. Aortic valve mean gradient measures 6.0 mmHg. Aortic valve peak gradient measures 11.0 mmHg. Aortic valve area, by VTI measures 2.33 cm. 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 Procedure Date: 02/25/23. Pulmonic Valve: The pulmonic valve was not well visualized. Pulmonic valve regurgitation is not visualized. No evidence of pulmonic stenosis. Aorta: The aortic root was not well visualized. Venous: IVC is small suggesting low RA pressure and hypovolemia. IAS/Shunts: No atrial level shunt detected by color flow Doppler.  LEFT VENTRICLE PLAX 2D LVIDd:         3.60 cm LVIDs:         2.30 cm LV PW:         1.00 cm LV IVS:        1.05 cm LVOT diam:     2.00 cm LV SV:         62 LV SV Index:   28 LVOT Area:     3.14 cm  RIGHT VENTRICLE RV S prime:     16.40 cm/s TAPSE (M-mode): 2.4 cm LEFT ATRIUM           Index LA Vol (A4C): 40.2 ml 18.28 ml/m  AORTIC VALVE AV Area (Vmax):    2.32 cm AV Area (Vmean):   2.10 cm AV Area (VTI):     2.33 cm AV Vmax:           165.50 cm/s AV Vmean:          118.000 cm/s AV VTI:            0.267 m AV Peak Grad:      11.0 mmHg AV Mean Grad:      6.0 mmHg LVOT Vmax:         122.00 cm/s LVOT Vmean:        78.700 cm/s LVOT VTI:          0.198 m LVOT/AV VTI ratio: 0.74 MITRAL VALVE MV Area (PHT): 6.32 cm    SHUNTS  MV Decel Time: 120 msec    Systemic VTI:  0.20 m MV E velocity: 96.10 cm/s  Systemic Diam: 2.00 cm Dorn Ross MD Electronically signed by Dorn Ross MD Signature Date/Time: 12/30/2023/10:24:20 AM    Final     SIGNED: Adriana DELENA Grams, MD, FHM. FAAFP. [facility] Critical care time spent - 75 min.  In seeing, evaluating and examining the patient. Reviewing medical records, labs, drawn plan of care. [facility],  Pager (please use [email or web address] to page/ text) Please use Epic Secure Chat for non-urgent communication (7AM-7PM)  If 7PM-7AM, please contact night-coverage [email or web address], 12/30/2023, 1:22 PM

## 2023-12-30 NOTE — Progress Notes (Signed)
 OT Cancellation Note  Patient Details Name: Gary Carroll MRN: [id number] DOB: 1945-10-16   Cancelled Treatment:    Reason Eval/Treat Not Completed: Medical issues which prohibited therapy. Pt still has the femoral line. Will attempt evaluation later as time permits and if pt is medically ready.   Blanchard Willhite OT, MOT   Jayson Person 12/30/2023, 9:46 AM

## 2023-12-30 NOTE — Plan of Care (Signed)

## 2023-12-30 NOTE — NC FL2 (Signed)
 [place]  MEDICAID FL2 LEVEL OF CARE FORM     IDENTIFICATION  Patient Name: Gary Carroll Birthdate: 1945-11-13 Sex: male Admission Date (Current Location): 12/28/2023  [facility] and [place] Number:  [employer] and Address:  [facility],  [address] [place] [address]      Provider Number: [id number]  Attending Physician Name and Address:  Willette Adriana LABOR, MD  Relative Name and Phone Number:       Current Level of Care: Hospital Recommended Level of Care: Skilled Nursing Facility Prior Approval Number:    Date Approved/Denied:   PASRR Number:    Discharge Plan: SNF    Current Diagnoses: Patient Active Problem List   Diagnosis Date Noted   Sepsis (HCC) 12/28/2023   Adjustment disorder with depressed mood 09/26/2023   Problem related to unspecified psychosocial circumstances 09/26/2023   Sepsis secondary to UTI (HCC) 09/23/2023   COVID-19 08/21/2023   Transportation insecurity 03/07/2023   Presence of intraocular lens 03/07/2023   Other secondary cataract, right eye 03/07/2023   Osteopenia 03/07/2023   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 with unspecified severity (HCC) 03/07/2023   Hyperglycemia, unspecified 03/07/2023   Disease of anus and rectum, unspecified 03/07/2023   Chronic venous insufficiency 03/07/2023   Bilateral ankle joint pain 03/07/2023   Age-related nuclear cataract, bilateral 03/07/2023   CAD (coronary artery disease) 02/25/2023   (HFpEF) heart failure with preserved ejection fraction (HCC) 02/25/2023   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   Acute hypoxemic respiratory failure (HCC) 01/27/2023   Nonrheumatic aortic (valve) stenosis 01/27/2023   Primary adenocarcinoma of colon (HCC) 01/27/2023   Insomnia 01/27/2023   Elevated troponin 12/21/2022   Hemorrhoids 08/29/2022   Other fecal abnormalities  06/24/2022   Urethritis 05/01/2022   Candidiasis of penis 05/01/2022   Hardening of the aorta (main artery of the heart) (HCC) 04/29/2022   Complication of diabetes mellitus (HCC) 04/29/2022   Chronic obstructive pulmonary disease (HCC) 04/29/2022   Pressure injury of skin of sacral region 10/18/2021   Edema of both lower extremities 10/18/2021   Other urogenital candidiasis 09/29/2021   Dysuria 09/29/2021   Upper respiratory infection 07/05/2021   Restrictive lung disease 06/26/2021   Multiple nodules of lung 04/30/2021   Otalgia of left ear 04/04/2021   Acute otitis media 04/04/2021   Tinea cruris 03/19/2021   Chronic anticoagulation 03/14/2021   Aortic stenosis 03/14/2021   Pressure injury of skin of buttock 02/03/2021   Acute respiratory failure with hypoxia and hypercapnia (HCC)    Abnormal computed tomography scan 01/01/2021   History of tobacco use 10/23/2020   Sleep apnea 09/25/2020   Type 2 diabetes mellitus (HCC) 09/25/2020   Senile osteoporosis 09/25/2020   Retention of urine 09/25/2020   Morbid obesity (HCC) 09/25/2020   Chronic combined systolic and diastolic heart failure (HCC) 09/25/2020   Lower urinary tract symptoms due to benign prostatic hyperplasia 09/25/2020   Chronic postoperative pain 03/19/2018   Elevated transaminase level    Obstructive sleep apnea syndrome 10/01/2017   Benign prostatic hyperplasia with lower urinary tract symptoms 02/04/2017   Urinary tract infection 02/04/2017   Cellulitis of left lower extremity 02/03/2017   Gastroesophageal reflux disease without esophagitis 02/03/2017   AF (paroxysmal atrial fibrillation) (HCC) 02/03/2017   Bladder outlet obstruction 08/22/2015   Hydronephrosis determined by ultrasound 08/22/2015   AKI (acute kidney injury) (HCC) 08/22/2015   Hypernatremia 08/22/2015  Acute blood loss anemia 08/22/2015   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   Primary osteoarthritis of right knee 06/09/2015    Chronic pain of right knee 05/23/2015   Peripheral edema 08/31/2012   Urinary frequency 08/31/2012   Diverticulosis 03/23/2012   Impaired fasting glucose 02/06/2012   Decreased libido 03/07/2011   Erectile dysfunction 03/07/2011   Essential hypertension 12/20/2010   Hyperlipidemia, unspecified 12/20/2010   History of cellulitis 05/02/2010    Orientation RESPIRATION BLADDER Height & Weight     Self, Time, Situation, Place  Normal Incontinent Weight: 205 lb 7.5 oz (93.2 kg) Height:  6' 2 (188 cm)  BEHAVIORAL SYMPTOMS/MOOD NEUROLOGICAL BOWEL NUTRITION STATUS      Incontinent Diet (regular)  AMBULATORY STATUS COMMUNICATION OF NEEDS Skin   Extensive Assist Verbally Normal, Other (Comment) (pressure injury pretibial left, pressure injury buttocks right stage 2, pressure injury anus medial lower stage 3)                       Personal Care Assistance Level of Assistance  Bathing, Feeding, Dressing Bathing Assistance: Limited assistance Feeding assistance: Independent Dressing Assistance: Limited assistance     Functional Limitations Info  Sight, Hearing, Speech Sight Info: Impaired Hearing Info: Impaired Speech Info: Adequate    SPECIAL CARE FACTORS FREQUENCY  PT (By licensed PT), OT (By licensed OT)     PT Frequency: 5 times weekly OT Frequency: 5 times weekly            Contractures Contractures Info: Not present    Additional Factors Info  Code Status, Allergies Code Status Info: FULL Allergies Info: Diovan (valsartan) and Tagamet (cimetidine)           Current Medications (12/30/2023):  This is the current hospital active medication list Current Facility-Administered Medications  Medication Dose Route Frequency Provider Last Rate Last Admin   acetaminophen  (TYLENOL ) tablet 650 mg  650 mg Oral Q6H PRN Willette Jest A, MD   650 mg at 12/29/23 2156   Or   acetaminophen  (TYLENOL ) suppository 650 mg  650 mg Rectal Q6H PRN Shahmehdi, Jest LABOR, MD        apixaban  (ELIQUIS ) tablet 5 mg  5 mg Oral BID Shahmehdi, Seyed A, MD   5 mg at 12/30/23 9171   bisacodyl  (DULCOLAX) EC tablet 5 mg  5 mg Oral Daily PRN Shahmehdi, Jest LABOR, MD       cefTRIAXone  (ROCEPHIN ) 2 g in sodium chloride  0.9 % 100 mL IVPB  2 g Intravenous Q24H Willette Jest LABOR, MD   Stopped at 12/29/23 1755   Chlorhexidine  Gluconate Cloth 2 % PADS 6 each  6 each Topical Q0600 Shahmehdi, Seyed A, MD   6 each at 12/30/23 0555   clotrimazole -betamethasone  (LOTRISONE ) cream 1 Application  1 Application Topical BID Willette Jest A, MD   1 Application at 12/30/23 0829   [START ON 12/31/2023] diltiazem  (CARDIZEM  CD) 24 hr capsule 180 mg  180 mg Oral Daily Shahmehdi, Seyed A, MD       HYDROmorphone  (DILAUDID ) injection 0.5-1 mg  0.5-1 mg Intravenous Q2H PRN Shahmehdi, Seyed A, MD       ipratropium (ATROVENT ) nebulizer solution 0.5 mg  0.5 mg Nebulization Q6H PRN Shahmehdi, Seyed A, MD       lactobacillus (FLORANEX/LACTINEX) granules 1 g  1 g Oral TID WC Shahmehdi, Seyed A, MD   1 g at 12/30/23 0828   leptospermum manuka honey (MEDIHONEY) paste 1 Application  1 Application Topical  Daily Willette Jest A, MD   1 Application at 12/30/23 9170   levalbuterol  (XOPENEX ) nebulizer solution 0.63 mg  0.63 mg Nebulization Q6H PRN Shahmehdi, Seyed A, MD       liver oil-zinc  oxide (DESITIN) 40 % ointment   Topical BID Willette Jest A, MD   Given at 12/30/23 0829   melatonin tablet 9 mg  9 mg Oral QHS Shahmehdi, Seyed A, MD       metoprolol  tartrate (LOPRESSOR ) injection 2.5 mg  2.5 mg Intravenous Q4H PRN Shahmehdi, Seyed A, MD       metoprolol  tartrate (LOPRESSOR ) tablet 25 mg  25 mg Oral BID Shahmehdi, Seyed A, MD   25 mg at 12/30/23 9171   midodrine  (PROAMATINE ) tablet 5 mg  5 mg Oral TID WC Shahmehdi, Seyed A, MD   5 mg at 12/30/23 9170   mupirocin  ointment (BACTROBAN ) 2 %   Nasal BID Willette Jest A, MD   Given at 12/30/23 9170   ondansetron  (ZOFRAN ) tablet 4 mg  4 mg Oral Q6H PRN Shahmehdi, Seyed  A, MD       Or   ondansetron  (ZOFRAN ) injection 4 mg  4 mg Intravenous Q6H PRN Shahmehdi, Seyed A, MD       Oral care mouth rinse  15 mL Mouth Rinse PRN Shahmehdi, Seyed A, MD       oxybutynin  (DITROPAN ) tablet 5 mg  5 mg Oral QPM Shahmehdi, Seyed A, MD   5 mg at 12/29/23 1719   oxyCODONE  (Oxy IR/ROXICODONE ) immediate release tablet 5 mg  5 mg Oral Q4H PRN Shahmehdi, Seyed A, MD   5 mg at 12/28/23 1511   senna-docusate (Senokot-S) tablet 1 tablet  1 tablet Oral QHS PRN Shahmehdi, Seyed A, MD       sodium chloride  flush (NS) 0.9 % injection 3 mL  3 mL Intravenous Q12H Shahmehdi, Seyed A, MD   3 mL at 12/30/23 0842   sodium chloride  flush (NS) 0.9 % injection 3 mL  3 mL Intravenous Q12H Shahmehdi, Seyed A, MD   3 mL at 12/30/23 0842   traZODone  (DESYREL ) tablet 25 mg  25 mg Oral QHS PRN Willette Jest LABOR, MD         Discharge Medications: Please see discharge summary for a list of discharge medications.  Relevant Imaging Results:  Relevant Lab Results:   Additional Information SSN: [id number] [place]

## 2023-12-30 NOTE — Progress Notes (Addendum)
 Daily Progress Note   Patient Name: Gary Carroll       Date: 12/30/2023 DOB: Nov 22, 1945  Age: 78 y.o. MRN#: [id number] Attending Physician: Willette Adriana LABOR, MD Primary Care Physician: [facility] Admit Date: 12/28/2023  Reason for Consultation/Follow-up: Establishing goals of care  Subjective:   78 y.o. male  with past medical history of severe aortic stenosis s/p TAVR, paroxysmal A-fib on Eliquis , CAD, rectal adenocarcinoma, HTN, HLD, DM2, chronic heart failure HFpEF admitted on 12/28/2023 with generalized weakness, confusion, and fever.   Workup revealed findings concerning for sepsis. He was admitted to stepdown and treated with IV fluids and IV abx.  Currently requiring vasopressor support.   Patient has had declining health over the past few months.  He has had recurrent hospitalizations with 3 in the past 6 months.  He is also required skilled nursing facility rehabilitation stays and unfortunately has not been able to recover enough function to return home safely.  12/29/2023: Initial consult completed.  Patient had great difficulty making decisions as a relates to his healthcare.  While he did indicate he would never want to be bedbound or functionally dependent, he did not want to make any changes to his advance directives or CODE STATUS at this time.  He wanted to reach out to one of his friends Dorisann Point) to see if she could provide any assistance.  He was agreeable for me to call her and give her an update.  Unfortunately, she expresses that she does not feel comfortable making medical decisions for him.  Today, lab work reviewed.  Renal function remained stable.  Continues to have some mild hypocalcemia although improved from yesterday.  Albumin levels and total protein continue to be low.  AST minimally  elevated with other transaminases within normal limits.  BNP elevated but improved from prior level.  White blood cell count normal.  Progressive decline in hemoglobin noted with most recent being 8.7 g/dL compared to 9.3 g/dL on 1/88/7974.  He continues to have some mild intermittent tachycardia and SBP readings in the 100s.  He is now requiring O2 via nasal cannula. Able to be weaned from pressor support. Has not required PRN pain medications in the past 24 hrs. V/S relatively stable with some intermittent tachycardia and SBPs in the 90s-100s. On O2 to maintain saturations.   Patient states he is tired.  He does report some mild pain to his bottom.  He denies any shortness of breath, nausea, or vomiting.  I again discussed our concerns about his long-term prognosis given his recurrent infections requiring hospitalization and his persistent functional declines.  Discussed that these findings imply progression closer to end-of-life.  We again discussed the limitations and potential burdens of CPR and intubation, particularly in the context of advanced age and serious underlying health conditions.  Patient states that he would never want to be kept alive on machines but that he would consider a time-limited trial of CPR and ventilation.  However, he shares that if his prognosis was poor or if they felt he would have to be dependent on machines long-term then he would want to be transition to comfort care.  He is easily distracted and requires frequent reorientation back to the topic.  He is concerned about where his laptop, wallet, and cell phone are.  He thinks they may be back at the skilled nursing facility where he came from.  He shares he does not want to return back to that facility and hopes to go to a [place] facility. Provided education about outpatient palliative care services, including their role in symptom management, emotional support, and enhancing quality of life. He agrees to OP Palliative care referral  at discharge.   I also spoke with patient's friend Dorisann Point) via phone and provided her an update. Discussed my conversation with him and our concerns regarding his health status and GOC. She states she is agreement with honoring his wishes of temporary life support and feels comfortable withdrawing life support if prognosis is grim. She is also in agreement with OP palliative care. She also has his phone and belongings and will bring his requested items to the hospital.   Chart review/care coordination:  Completed extensive chart review including EPIC notes, labs (independently reviewed), medication administration record, vital signs. Coordinated care with admitting team, bedside nursing staff, and TOC.    Length of Stay: 2   Physical Exam Constitutional:      General: He is not in acute distress.    Appearance: He is ill-appearing. He is not toxic-appearing.  Pulmonary:     Effort: Pulmonary effort is normal. No respiratory distress.  Skin:    General: Skin is warm and dry.     Comments: Thin, fragile skin  Neurological:     Comments: Drowsy but arouses to verbal stimuli and remains awake throughout history taking   Psychiatric:        Attention and Perception: He is inattentive.             Vital Signs: BP 102/66   Pulse 82   Temp 97.8 F (36.6 C) (Oral)   Resp (!) 23   Ht 6' 2 (1.88 m)   Wt 93.2 kg   SpO2 98%   BMI 26.38 kg/m  SpO2: SpO2: 98 % O2 Device: O2 Device: Room Air O2 Flow Rate: O2 Flow Rate (L/min): 2 L/min      Palliative Assessment/Data: 40-50%   Palliative Care Assessment & Plan   Patient Profile/Assessment:  78 year old male with multiple medical problems admitted for sepsis.  Patient has had increased frequency of hospitalizations and significant decline in function during the past 6 months.  Goals of care and advance care planning discussions have been completed.  Patient has very little family support.  He does have several friends who  help support him.  However, his friends have previously expressed that they do not feel comfortable making medical decisions for him.  I have shared our concerns regarding his overall health status and recent declines as well as discussed the limitations of CPR and intubation.  Patient has a difficult time making medical  decisions but states that he would be willing for a trial of CPR and ventilation but would not want to be kept alive long-term on machines and if prognosis was poor he would prefer to be transition to comfort care.  He is agreeable to a palliative care follow-up as an outpatient.   Recommendations/Plan:  Full code/full scope, time-limited trial of CPR and intubation with withdrawal and transition to comfort care if prognosis poor Outpatient palliative referral Continue current symptom regimen Inpatient palliative team to continue to follow for ongoing goals of care/anticipatory care plan discussions and symptom management   Symptom management:  Continue Tylenol  650 mg every 6 hours as needed (only 1 dose needed since admission) Continue hydromorphone  0.5 to 1 mg every 2 hours as needed (none needed thus far) Continue Zofran  4 mg every 6 hours as needed (not needed thus far) Continue oxycodone  5 mg every 4 hours as needed (last dose 12/28/2023) Continue current bowel regimen Continue trazodone  25 mg nightly as needed (not needed thus far)   Prognosis: Guarded/poor    Discharge Planning: To Be Determined    Detailed review of medical records (labs, imaging, vital signs), medically appropriate exam, discussed with treatment team, counseling and education to patient, family, & staff, documenting clinical information, medication management, coordination of care   Total time: I spent 40 minutes in the care of the patient today in the above activities and documenting the encounter.         Laymon CHRISTELLA Pinal, NP  Palliative Medicine Team Team phone #  [phone]  Thank you for allowing the Palliative Medicine Team to assist in the care of this patient. Please utilize secure chat with additional questions, if there is no response within 30 minutes please call the above phone number.  Palliative Medicine Team providers are available by phone from 7am to 7pm daily and can be reached through the team cell phone.  Should this patient require assistance outside of these hours, please call the patient's attending physician.

## 2023-12-31 ENCOUNTER — Inpatient Hospital Stay (HOSPITAL_COMMUNITY)

## 2023-12-31 ENCOUNTER — Other Ambulatory Visit: Payer: Self-pay

## 2023-12-31 DIAGNOSIS — I959 Hypotension, unspecified: Secondary | ICD-10-CM

## 2023-12-31 DIAGNOSIS — A409 Streptococcal sepsis, unspecified: Secondary | ICD-10-CM

## 2023-12-31 DIAGNOSIS — E872 Acidosis, unspecified: Secondary | ICD-10-CM

## 2023-12-31 DIAGNOSIS — Z7189 Other specified counseling: Secondary | ICD-10-CM | POA: Diagnosis not present

## 2023-12-31 DIAGNOSIS — Z515 Encounter for palliative care: Secondary | ICD-10-CM | POA: Diagnosis not present

## 2023-12-31 DIAGNOSIS — N390 Urinary tract infection, site not specified: Secondary | ICD-10-CM | POA: Diagnosis not present

## 2023-12-31 DIAGNOSIS — R6521 Severe sepsis with septic shock: Secondary | ICD-10-CM | POA: Diagnosis not present

## 2023-12-31 DIAGNOSIS — A419 Sepsis, unspecified organism: Secondary | ICD-10-CM | POA: Diagnosis not present

## 2023-12-31 DIAGNOSIS — R6520 Severe sepsis without septic shock: Secondary | ICD-10-CM | POA: Diagnosis not present

## 2023-12-31 LAB — URINE CULTURE: Culture: 40000 — AB

## 2023-12-31 LAB — COMPREHENSIVE METABOLIC PANEL WITH GFR
ALT: 40 U/L (ref 0–44)
AST: 67 U/L — ABNORMAL HIGH (ref 15–41)
Albumin: 1.8 g/dL — ABNORMAL LOW (ref 3.5–5.0)
Alkaline Phosphatase: 86 U/L (ref 38–126)
Anion gap: 6 (ref 5–15)
BUN: 16 mg/dL (ref 8–23)
CO2: 23 mmol/L (ref 22–32)
Calcium: 7.9 mg/dL — ABNORMAL LOW (ref 8.9–10.3)
Chloride: 109 mmol/L (ref 98–111)
Creatinine, Ser: 0.49 mg/dL — ABNORMAL LOW (ref 0.61–1.24)
GFR, Estimated: >60 mL/min (ref >60)
Glucose, Bld: 97 mg/dL (ref 70–99)
Potassium: 3.3 mmol/L — ABNORMAL LOW (ref 3.5–5.1)
Sodium: 138 mmol/L (ref 135–145)
Total Bilirubin: 0.5 mg/dL (ref 0.0–1.2)
Total Protein: 4.9 g/dL — ABNORMAL LOW (ref 6.5–8.1)

## 2023-12-31 LAB — CBC
HCT: 27.2 % — ABNORMAL LOW (ref 39.0–52.0)
Hemoglobin: 8.6 g/dL — ABNORMAL LOW (ref 13.0–17.0)
MCH: 29.7 pg (ref 26.0–34.0)
MCHC: 31.6 g/dL (ref 30.0–36.0)
MCV: 93.8 fL (ref 80.0–100.0)
Platelets: 77 K/uL — ABNORMAL LOW (ref 150–400)
RBC: 2.9 MIL/uL — ABNORMAL LOW (ref 4.22–5.81)
RDW: 16.1 % — ABNORMAL HIGH (ref 11.5–15.5)
WBC: 6.3 K/uL (ref 4.0–10.5)
nRBC: 0 % (ref 0.0–0.2)

## 2023-12-31 LAB — BRAIN NATRIURETIC PEPTIDE: B Natriuretic Peptide: 286 pg/mL — ABNORMAL HIGH (ref 0.0–100.0)

## 2023-12-31 LAB — GLUCOSE, CAPILLARY: Glucose-Capillary: 101 mg/dL — ABNORMAL HIGH (ref 70–99)

## 2023-12-31 LAB — MAGNESIUM: Magnesium: 1.6 mg/dL — ABNORMAL LOW (ref 1.7–2.4)

## 2023-12-31 MED ORDER — DILTIAZEM HCL ER COATED BEADS 240 MG PO CP24
240.0000 mg | ORAL_CAPSULE | Freq: Every day | ORAL | Status: DC
  Administered 2023-12-31 – 2024-01-06 (×8): 240 mg via ORAL
  Filled 2023-12-31 (×8): qty 1

## 2023-12-31 MED ORDER — VITAMIN C 500 MG PO TABS
500.0000 mg | ORAL_TABLET | Freq: Two times a day (BID) | ORAL | Status: DC
  Administered 2023-12-31 – 2024-01-06 (×14): 500 mg via ORAL
  Filled 2023-12-31 (×14): qty 1

## 2023-12-31 MED ORDER — POTASSIUM CHLORIDE CRYS ER 20 MEQ PO TBCR
40.0000 meq | EXTENDED_RELEASE_TABLET | Freq: Once | ORAL | Status: AC
  Administered 2023-12-31 (×2): 40 meq via ORAL
  Filled 2023-12-31: qty 2

## 2023-12-31 MED ORDER — SODIUM CHLORIDE 0.9% FLUSH: 10.0000 mL | INTRAVENOUS | Status: DC | PRN

## 2023-12-31 MED ORDER — SODIUM CHLORIDE 0.9% FLUSH
10.0000 mL | Freq: Two times a day (BID) | INTRAVENOUS | Status: DC
  Administered 2023-12-31 – 2024-01-01 (×3): 10 mL

## 2023-12-31 MED ORDER — PENICILLIN G POTASSIUM 20000000 UNITS IJ SOLR
24.0000 10*6.[IU] | INTRAVENOUS | Status: DC
  Administered 2023-12-31 – 2024-01-04 (×6): 24 10*6.[IU] via INTRAVENOUS
  Administered 2024-01-05: 400 10*6.[IU] via INTRAVENOUS
  Administered 2024-01-05: 24 10*6.[IU] via INTRAVENOUS
  Filled 2023-12-31 (×6): qty 20
  Filled 2023-12-31: qty 24

## 2023-12-31 MED ORDER — ZINC SULFATE 220 (50 ZN) MG PO CAPS
220.0000 mg | ORAL_CAPSULE | Freq: Every day | ORAL | Status: DC
  Administered 2023-12-31 – 2024-01-06 (×8): 220 mg via ORAL
  Filled 2023-12-31 (×8): qty 1

## 2023-12-31 MED ORDER — ADULT MULTIVITAMIN W/MINERALS CH
1.0000 | ORAL_TABLET | Freq: Every day | ORAL | Status: DC
  Administered 2023-12-31 – 2024-01-06 (×8): 1 via ORAL
  Filled 2023-12-31 (×8): qty 1

## 2023-12-31 NOTE — TOC Progression Note (Signed)
 Transition of Care [facility]) - Progression Note    Patient Details  Name: Gary Carroll MRN: [id number] Date of Birth: 08/07/1945  Transition [facility] CM/SW Contact  Lucie Lunger, [place] Phone Number: 12/31/2023, 10:25 AM  Clinical Narrative:    CSW notes that [facility] offered bed for pt. CSW updated pts POA [name] that [facility] offered, she states this is their preference and they would like for pt to go to [facility] once medically stable. CSW also updated by Palliative that op palliative referral is needed, CSW sent referral to [facility] with [facility] who will follow. TOC to follow.   Expected Discharge Plan: Skilled Nursing Facility Barriers to Discharge: Continued Medical Work up               Expected Discharge Plan and Services In-house Referral: Clinical Social Work Discharge Planning Services: CM Consult Post Acute Care Choice: Skilled Nursing Facility Living arrangements for the past 2 months: Skilled Nursing Facility                                       Social Drivers of Health (SDOH) Interventions SDOH Screenings   Food Insecurity: Patient Unable To Answer (12/28/2023)  Housing: Patient Unable To Answer (12/28/2023)  Transportation Needs: Patient Unable To Answer (12/28/2023)  Utilities: Patient Unable To Answer (12/28/2023)  Alcohol Screen: Low Risk  (02/12/2023)  Depression (PHQ2-9): Low Risk  (10/10/2023)  Recent Concern: Depression (PHQ2-9) - Medium Risk (09/26/2023)  Financial Resource Strain: Low Risk  (09/26/2023)  Physical Activity: Inactive (09/26/2023)  Social Connections: Patient Unable To Answer (12/28/2023)  Stress: No Stress Concern Present (09/26/2023)  Tobacco Use: Medium Risk (09/24/2023)  Health Literacy: Adequate Health Literacy (03/07/2023)    Readmission Risk Interventions    12/29/2023    8:50 AM 09/24/2023    2:43 PM 02/27/2023   11:39 AM  Readmission Risk Prevention Plan  Transportation Screening Complete Complete Complete  PCP or  Specialist Appt within 3-5 Days  Not Complete   HRI or Home Care Consult   Complete  Social Work Consult for Recovery Care Planning/Counseling  Complete Complete  Palliative Care Screening  Not Applicable Not Applicable  Medication Review Oceanographer) Complete Complete Complete  HRI or Home Care Consult Complete    SW Recovery Care/Counseling Consult Complete    Palliative Care Screening Not Applicable    Skilled Nursing Facility Complete

## 2023-12-31 NOTE — Consult Note (Signed)
 [facility]                                                                                       Patient Identification: Patient Name: Gary Carroll MRN: [id number] Admit Date: 12/28/2023 11:35 AM Today's Date: 12/31/2023 Reason for consult: Bacteremia Requesting provider: Dr Willette  Location: Patient: Gary Carroll Provider: Darryle Law   Principal Problem:   Septic shock [facility]) Active Problems:   Essential hypertension   Hyperlipidemia, unspecified   AKI (acute kidney injury) (HCC)   Gastroesophageal reflux disease without esophagitis   AF (paroxysmal atrial fibrillation) (HCC)   Benign prostatic hyperplasia with lower urinary tract symptoms   Urinary tract infection   Elevated transaminase level   Aortic stenosis   CAD (coronary artery disease)   (HFpEF) heart failure with preserved ejection fraction (HCC)   S/P TAVR (transcatheter aortic valve replacement)   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 with unspecified severity (HCC)   Morbid obesity (HCC)   Insomnia   Disease of anus and rectum, unspecified   Chronic venous insufficiency   Complication of diabetes mellitus (HCC)   Sepsis secondary to UTI (HCC)   Adjustment disorder with depressed mood   Sepsis (HCC)   Hypotensive episode   Bacteremia   Lactic acidosis   Antibiotics:  Vancomycin  8/10 Pip-tazo 8/10 Ceftriaxone  8/10 Azithromycin  8/10  Lines/Hardware: Left Fem CVC+, bilateral prosthetic knee   Assessment # Septic shock 2/2 - resolved, off vasopressors # Strep group C bacteremia - no concerns noted in bilateral prosthetic knee - 8/11 blood culture NG in 2 days - 8/12 TTE negative for vegetations or endocarditis, poor study   -  # ? Pneumonia in CXR -no respiratory symptoms documented, stable on 2 L nasal cannula  # Anal fissure/chronic wound rt leg-does not appear to be  infected based on pictures in media, wound care and d/w primary team  # AKI resolved  Recommendations - Will switch IV antibiotics to IV penicillin  only ( MIC<0.06) - fu repeat blood cultures  - Needs TEE due to TAVR to r/o endocarditis - Monitor CBC, CMP - Monitor for metastatic sites of infection including prosthetic knees - Fluid, electrolytes management per primary - Universal/standard isolation precaution D/w primary team  Rest of the management as per the primary team. Please call with questions or concerns.  Thank you for the consult  __________________________________________________________________________________________________________ HPI and Hospital Course: 78 year old male with multiple comorbidities as below including h/o rectal cancer with chronic fissure, DM, A-fib, CAD, HLD, HTN, COPD on 2 L nasal cannula severe aortic stenosis s/p TAVR, bilateral knee arthroplasty, chronic rt leg wound, strep dysgalactiae bacteremia in 2018, E coli bacteremia/UTI in May 2025 who presented to the ED on 8/10 for fever, weakness and confusion.   At ED febrile, tachycardic Labs remarkable for K5.3, AKI with creatinine 1.54, albumin 2.6, AST 63, ALT 47, lactic acid 5.0, WBC 15.3 UA cloudy, moderate leukocytes, negative nitrite protein 30, RBC 11-20 and WBC more than 50, Urine cx pending Blood cx 8/10 2/2 sets Streptococcus group C MRSA PCR positive Received IVF, broad-spectrum antibiotics  Chest x-ray  patchy retrocardiac airspace opacities likely atelectasis in the setting of low lung volumes, additionally a developing bronchopneumonia could have this appearance in the cardiac clinical sign text.   ID consulted for Streptococcus bacteremia  ROS: unable to recall symptoms PTA.  Denies any fever, chills, denies nausea, vomiting or abdominal pain or diarrhea.  Denies any chest pain cough or shortness of breath or GU symptoms.  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 Wonda and Dr. Lucas   Severe aortic stenosis    Sleep apnea    Urinary frequency    Urinary urgency    Wears glasses    Past Surgical History:  Procedure Laterality Date   ABDOMINAL AORTOGRAM W/LOWER EXTREMITY N/A 02/05/2023   Procedure: ABDOMINAL AORTOGRAM W/LOWER EXTREMITY;  Surgeon: Wendel Lurena POUR, MD;  [facility]: [facility] INVASIVE CV LAB;  Service: Cardiovascular;  Laterality: N/A;   CATARACT EXTRACTION W/PHACO Left 12/07/2021   Procedure: CATARACT EXTRACTION PHACO AND INTRAOCULAR LENS PLACEMENT (IOC);  Surgeon: Harrie Agent, MD;  Location: [facility] ORS;  Service: Ophthalmology;  Laterality: Left;  CDE 7.75   CATARACT EXTRACTION W/PHACO Right 12/21/2021   Procedure: CATARACT EXTRACTION PHACO AND INTRAOCULAR LENS PLACEMENT (IOC);  Surgeon: Harrie Agent, MD;  Location: [facility] ORS;  Service: Ophthalmology;  Laterality: Right;  CDE: 8.00   CHOLECYSTECTOMY     COLONOSCOPY  02/2012   Dr. Debrah: sigmoid, transverse, and ascending colon diverticulosis   COLONOSCOPY WITH PROPOFOL  N/A 01/13/2023   Procedure: COLONOSCOPY WITH PROPOFOL ;  Surgeon: Cindie Carlin POUR, DO;  Location: [facility] ENDO SUITE;  Service: Endoscopy;  Laterality: N/A;  1:00 pm, asa 3   FINGER SURGERY Right    INTRAOPERATIVE TRANSTHORACIC ECHOCARDIOGRAM N/A 02/25/2023   Procedure: INTRAOPERATIVE TRANSTHORACIC  ECHOCARDIOGRAM;  Surgeon: Wonda Sharper, MD;  Location: [facility] LAB;  Service: Open Heart Surgery;  Laterality: N/A;   prostate procedure  2017   per patient, was having trouble urinating    RIGHT HEART CATH AND CORONARY ANGIOGRAPHY N/A 02/05/2023   Procedure: RIGHT HEART CATH AND CORONARY ANGIOGRAPHY;  Surgeon: Wendel Lurena POUR, MD;  Location: [facility] CV LAB;  Service: Cardiovascular;  Laterality: N/A;   TONSILLECTOMY     TOTAL KNEE ARTHROPLASTY  2012   left (Dr. Dorian)   TOTAL KNEE ARTHROPLASTY Right 06/19/2015   Procedure: TOTAL KNEE ARTHROPLASTY;  Surgeon: Marcey Raman, MD;  Location: [facility] OR;  Service: Orthopedics;  Laterality: Right;   TRANSCATHETER AORTIC VALVE REPLACEMENT, TRANSFEMORAL N/A 02/25/2023   Procedure: Transcatheter Aortic Valve Replacement, Transfemoral;  Surgeon: Wonda Sharper, MD;  Location[facility]VAS[facility] CV LAB;  Service: Open Heart Surgery;  Laterality: N/A;    Scheduled Meds:  apixaban   5 mg Oral BID   Chlorhexidine   Gluconate Cloth  6 each Topical Q0600   clotrimazole -betamethasone   1 Application Topical BID   diltiazem   240 mg Oral Daily   feeding supplement  237 mL Oral BID BM   lactobacillus  1 g Oral TID WC   leptospermum manuka honey  1 Application Topical Daily   liver oil-zinc  oxide   Topical BID   melatonin  9 mg Oral QHS   metoprolol  tartrate  25 mg Oral BID   midodrine   5 mg Oral TID WC   mupirocin  ointment   Nasal BID   oxybutynin   5 mg Oral QPM   sodium chloride  flush  3 mL Intravenous Q12H   sodium chloride  flush  3 mL Intravenous Q12H   Continuous Infusions:  penicillin  G potassium 24 Million Units in dextrose  5 % 500 mL CONTINUOUS infusion     PRN Meds:.acetaminophen  **OR** acetaminophen , bisacodyl , HYDROmorphone  (DILAUDID ) injection, ipratropium, levalbuterol , metoprolol  tartrate, ondansetron  **OR** ondansetron  (ZOFRAN ) IV, mouth rinse, oxyCODONE , senna-docusate, traZODone   Allergies  Allergen Reactions   Diovan [Valsartan] Cough    Tagamet [Cimetidine] Other (See Comments)    Irritability    Social History   Socioeconomic History   Marital status: Divorced    Spouse name: deceased   Number of children: 0   Years of education: Not on file   Highest education level: 12th grade  Occupational History   Occupation: retired Lobbyist)    Employer: RETIRED  Tobacco Use   Smoking status: Former    Current packs/day: 0.00    Types: Cigarettes    Quit date: 01/18/2010    Years since quitting: 13.9   Smokeless tobacco: Never   Tobacco comments:    Smoked for 25 years- quit September 2011.  Passive exposure from wife  Vaping Use   Vaping status: Never Used  Substance and Sexual Activity   Alcohol use: Not Currently    Alcohol/week: 1.0 - 2.0 standard drink of alcohol    Types: 1 - 2 Glasses of wine per week   Drug use: No   Sexual activity: Not on file  Other Topics Concern   Not on file  Social History Narrative   Separated from wife (2014) and moved to [place]. Retired- worked in [employer]   09/26/22 divorced twice, his last wife died, no children, no living siblings, has a few cousins  in their 64s and over 41 years old.    has been using [employer] recently to get food.    Prior illness he had been independent with ADLs/IADLs, even driving.   Social Drivers of Corporate investment banker Strain: Low Risk  (09/26/2023)   Overall Financial Resource Strain (CARDIA)    Difficulty of Paying Living Expenses: Not hard at all  Food Insecurity: Patient Unable To Answer (12/28/2023)   Hunger Vital Sign    Worried About Running Out of Food in the Last Year: Patient unable to answer    Ran Out of Food in the Last Year: Patient unable to answer  Transportation Needs: Patient Unable To Answer (12/28/2023)   PRAPARE - Transportation    Lack of Transportation (Medical): Patient unable to answer    Lack of Transportation (Non-Medical): Patient unable to answer  Physical Activity: Inactive (09/26/2023)    Exercise Vital Sign    Days of Exercise per Week: 0 days    Minutes of Exercise per Session: 0 min  Stress: No Stress Concern Present (09/26/2023)   [employer] of Occupational Health - Occupational Stress Questionnaire  Feeling of Stress : Only a little  Social Connections: Patient Unable To Answer (12/28/2023)   Social Connection and Isolation Panel    Frequency of Communication with Friends and Family: Patient unable to answer    Frequency of Social Gatherings with Friends and Family: Patient unable to answer    Attends Religious Services: Patient unable to answer    Active Member of Clubs or Organizations: Patient unable to answer    Attends Banker Meetings: Patient unable to answer    Marital Status: Patient unable to answer  Intimate Partner Violence: Patient Unable To Answer (12/28/2023)   Humiliation, Afraid, Rape, and Kick questionnaire    Fear of Current or Ex-Partner: Patient unable to answer    Emotionally Abused: Patient unable to answer    Physically Abused: Patient unable to answer    Sexually Abused: Patient unable to answer   Family History  Problem Relation Age of Onset   Uterine cancer Mother    Cancer Mother        uterine   Coronary artery disease Father    Coronary artery disease Paternal Grandfather    Heart disease Paternal Grandfather    Colon cancer Neg Hx    Esophageal cancer Neg Hx    Stomach cancer Neg Hx    Rectal cancer Neg Hx    Liver disease Neg Hx     Vitals BP 121/81   Pulse (!) 126   Temp (!) 97.5 F (36.4 C) (Oral)   Resp (!) 21   Ht 6' 2 (1.88 m)   Wt 98.2 kg   SpO2 99%   BMI 27.80 kg/m    Physical Exam Virtual visit, Lying in the bed, comfortable Able to speak in full sentences   Pictures from 8/11 reviewed, does not appear to have active signs of infection in the left anterior leg and perirectal area           Pertinent Microbiology Results for orders placed or performed during the hospital  encounter of 12/28/23  Resp panel by RT-PCR (RSV, Flu A&B, Covid) Anterior Nasal Swab     Status: None   Collection Time: 12/28/23 11:52 AM   Specimen: Anterior Nasal Swab  Result Value Ref Range Status   SARS Coronavirus 2 by RT PCR NEGATIVE NEGATIVE Final    Comment: (NOTE) SARS-CoV-2 target nucleic acids are NOT DETECTED.  The SARS-CoV-2 RNA is generally detectable in upper respiratory specimens during the acute phase of infection. The lowest concentration of SARS-CoV-2 viral copies this assay can detect is 138 copies/mL. A negative result does not preclude SARS-Cov-2 infection and should not be used as the sole basis for treatment or other patient management decisions. A negative result may occur with  improper specimen collection/handling, submission of specimen other than nasopharyngeal swab, presence of viral mutation(s) within the areas targeted by this assay, and inadequate number of viral copies(<138 copies/mL). A negative result must be combined with clinical observations, patient history, and epidemiological information. The expected result is Negative.  Fact Sheet for Patients:  BloggerCourse.com  Fact Sheet for Healthcare Providers:  SeriousBroker.it  This test is no t yet approved or cleared by the [place]  FDA and  has been authorized for detection and/or diagnosis of SARS-CoV-2 by FDA under an Emergency Use Authorization (EUA). This EUA will remain  in effect (meaning this test can be used) for the duration of the COVID-19 declaration under Section 564(b)(1) of the Act, 21 U.S.C.section 360bbb-3(b)(1), unless the authorization is terminated  or revoked sooner.       Influenza A by PCR NEGATIVE NEGATIVE Final   Influenza B by PCR NEGATIVE NEGATIVE Final    Comment: (NOTE) The Xpert Xpress SARS-CoV-2/FLU/RSV plus assay is intended as an aid in the diagnosis of influenza from Nasopharyngeal swab specimens  and should not be used as a sole basis for treatment. Nasal washings and aspirates are unacceptable for Xpert Xpress SARS-CoV-2/FLU/RSV testing.  Fact Sheet for Patients: BloggerCourse.com  Fact Sheet for Healthcare Providers: SeriousBroker.it  This test is not yet approved or cleared by the [place]  FDA and has been authorized for detection and/or diagnosis of SARS-CoV-2 by FDA under an Emergency Use Authorization (EUA). This EUA will remain in effect (meaning this test can be used) for the duration of the COVID-19 declaration under Section 564(b)(1) of the Act, 21 U.S.C. section 360bbb-3(b)(1), unless the authorization is terminated or revoked.     Resp Syncytial Virus by PCR NEGATIVE NEGATIVE Final    Comment: (NOTE) Fact Sheet for Patients: BloggerCourse.com  Fact Sheet for Healthcare Providers: SeriousBroker.it  This test is not yet approved or cleared by the [place]  FDA and has been authorized for detection and/or diagnosis of SARS-CoV-2 by FDA under an Emergency Use Authorization (EUA). This EUA will remain in effect (meaning this test can be used) for the duration of the COVID-19 declaration under Section 564(b)(1) of the Act, 21 U.S.C. section 360bbb-3(b)(1), unless the authorization is terminated or revoked.  Performed at [facility], [address], [place], [place] [address]   Urine Culture     Status: Abnormal (Preliminary result)   Collection Time: 12/28/23 11:53 AM   Specimen: Urine, Random  Result Value Ref Range Status   Specimen Description   Final    URINE, RANDOM Performed at [facility], [address] [place], [place] [address]    Special Requests   Final    NONE Reflexed from [id number] Performed at [facility], [address], [place], [place] [address]    Culture 40,000 COLONIES/mL YEAST (A)  Final   Report Status PENDING  Incomplete   Blood Culture (routine x 2)     Status: Abnormal   Collection Time: 12/28/23 12:25 PM   Specimen: Right Antecubital; Blood  Result Value Ref Range Status   Specimen Description   Final    RIGHT ANTECUBITAL BOTTLES DRAWN AEROBIC AND ANAEROBIC Performed at [facility], [address] [place], [place] [address]    Special Requests   Final    Blood Culture adequate volume Performed at [facility], [address] [place], [place] [address]    Culture  Setup Time   Final    GRAM POSITIVE COCCI IN BOTH AEROBIC AND ANAEROBIC BOTTLES Gram Stain Report Called to,Read Back By and Verified With: C. KINDLEY ON 12/28/2023 @8 :20PM BY T.HAMER  Performed at [facility], [address], [place], [place] [address]    Culture (A)  Final    STREPTOCOCCUS GROUP C SUSCEPTIBILITIES PERFORMED ON PREVIOUS CULTURE WITHIN THE LAST 5 DAYS. Performed at [facility], [address], [place], [place] [address]    Report Status 12/30/2023 FINAL  Final  Blood Culture (routine x 2)     Status: Abnormal   Collection Time: 12/28/23 12:25 PM   Specimen: Left Antecubital; Blood  Result Value Ref Range Status   Specimen Description   Final    LEFT ANTECUBITAL BOTTLES DRAWN AEROBIC AND ANAEROBIC Performed at [facility], [address], [place], [place] [address]  Special Requests   Final    Blood Culture adequate volume Performed at [facility], [address] [place], [place] [address]    Culture  Setup Time   Final    GRAM POSITIVE COCCI Gram Stain Report Called to,Read Back By and Verified With: KINDLEY,C @ 2020 ON 12/28/23 BY [facility] IN BOTH AEROBIC AND ANAEROBIC BOTTLES GS DONE @ [facility] CRITICAL RESULT CALLED TO, READ BACK BY AND VERIFIED WITHBETHA JAYSON LINGER RN 12/29/2023 @ 0010 BY AB Performed at [facility], [address] [place], [place] [address]    Culture STREPTOCOCCUS GROUP C (A)  Final   Report Status 12/30/2023 FINAL  Final   Organism ID, Bacteria STREPTOCOCCUS GROUP C  Final       Susceptibility   Streptococcus group c - MIC*    CLINDAMYCIN RESISTANT Resistant     AMPICILLIN <=0.25 SENSITIVE Sensitive     ERYTHROMYCIN >=8 RESISTANT Resistant     VANCOMYCIN  0.5 SENSITIVE Sensitive     CEFTRIAXONE  <=0.12 SENSITIVE Sensitive     LEVOFLOXACIN 0.5 SENSITIVE Sensitive     PENICILLIN  <=0.06 SENSITIVE Sensitive     * STREPTOCOCCUS GROUP C  Blood Culture ID Panel (Reflexed)     Status: Abnormal   Collection Time: 12/28/23 12:25 PM  Result Value Ref Range Status   Enterococcus faecalis NOT DETECTED NOT DETECTED Final   Enterococcus Faecium NOT DETECTED NOT DETECTED Final   Listeria monocytogenes NOT DETECTED NOT DETECTED Final   Staphylococcus species NOT DETECTED NOT DETECTED Final   Staphylococcus aureus (BCID) NOT DETECTED NOT DETECTED Final   Staphylococcus epidermidis NOT DETECTED NOT DETECTED Final   Staphylococcus lugdunensis NOT DETECTED NOT DETECTED Final   Streptococcus species DETECTED (A) NOT DETECTED Final    Comment: Not Enterococcus species, Streptococcus agalactiae, Streptococcus pyogenes, or Streptococcus pneumoniae. CRITICAL RESULT CALLED TO, READ BACK BY AND VERIFIED WITH: C KINDLEY RN 12/29/2023 @ 0010 BY AB    Streptococcus agalactiae NOT DETECTED NOT DETECTED Final   Streptococcus pneumoniae NOT DETECTED NOT DETECTED Final   Streptococcus pyogenes NOT DETECTED NOT DETECTED Final   A.calcoaceticus-baumannii NOT DETECTED NOT DETECTED Final   Bacteroides fragilis NOT DETECTED NOT DETECTED Final   Enterobacterales NOT DETECTED NOT DETECTED Final   Enterobacter cloacae complex NOT DETECTED NOT DETECTED Final   Escherichia coli NOT DETECTED NOT DETECTED Final   Klebsiella aerogenes NOT DETECTED NOT DETECTED Final   Klebsiella oxytoca NOT DETECTED NOT DETECTED Final   Klebsiella pneumoniae NOT DETECTED NOT DETECTED Final   Proteus species NOT DETECTED NOT DETECTED Final   Salmonella species NOT DETECTED NOT DETECTED Final   Serratia marcescens NOT  DETECTED NOT DETECTED Final   Haemophilus influenzae NOT DETECTED NOT DETECTED Final   Neisseria meningitidis NOT DETECTED NOT DETECTED Final   Pseudomonas aeruginosa NOT DETECTED NOT DETECTED Final   Stenotrophomonas maltophilia NOT DETECTED NOT DETECTED Final   Candida albicans NOT DETECTED NOT DETECTED Final   Candida auris NOT DETECTED NOT DETECTED Final   Candida glabrata NOT DETECTED NOT DETECTED Final   Candida krusei NOT DETECTED NOT DETECTED Final   Candida parapsilosis NOT DETECTED NOT DETECTED Final   Candida tropicalis NOT DETECTED NOT DETECTED Final   Cryptococcus neoformans/gattii NOT DETECTED NOT DETECTED Final    Comment: Performed at [facility], [address] [place], [place] [address]  MRSA Next Gen by PCR, Nasal     Status: Abnormal   Collection Time: 12/28/23  2:30 PM   Specimen: Nasal Mucosa;  Nasal Swab  Result Value Ref Range Status   MRSA by PCR Next Gen DETECTED (A) NOT DETECTED Final    Comment: RESULT CALLED TO, READ BACK BY AND VERIFIED WITH: A SHELTON AT 1743 ON [id number] BY S DALTON (NOTE) The GeneXpert MRSA Assay (FDA approved for NASAL specimens only), is one component of a comprehensive MRSA colonization surveillance program. It is not intended to diagnose MRSA infection nor to guide or monitor treatment for MRSA infections. Test performance is not FDA approved in patients less than 22 years old. Performed at [facility], [address] [place], [place] [address]   Culture, blood (Routine X 2) w Reflex to ID Panel     Status: None (Preliminary result)   Collection Time: 12/29/23 11:31 PM   Specimen: BLOOD  Result Value Ref Range Status   Specimen Description BLOOD LEFT ANTECUBITAL  Final   Special Requests   Final    BOTTLES DRAWN AEROBIC AND ANAEROBIC Blood Culture adequate volume   Culture   Final    NO GROWTH 2 DAYS Performed at [facility], [address], [place], [place] [address]    Report Status PENDING  Incomplete  Culture,  blood (Routine X 2) w Reflex to ID Panel     Status: None (Preliminary result)   Collection Time: 12/29/23 11:32 PM   Specimen: BLOOD LEFT HAND  Result Value Ref Range Status   Specimen Description BLOOD LEFT HAND  Final   Special Requests   Final    AEROBIC BOTTLE ONLY Blood Culture results may not be optimal due to an inadequate volume of blood received in culture bottles   Culture   Final    NO GROWTH 2 DAYS Performed at [facility], [address], [place], [place] [address]    Report Status PENDING  Incomplete   Pertinent Lab seen by me:    Latest Ref Rng & Units 12/31/2023    2:28 AM 12/30/2023    5:05 AM 12/29/2023    4:33 AM  CBC  WBC 4.0 - 10.5 K/uL 6.3  7.4  17.7   Hemoglobin 13.0 - 17.0 g/dL 8.6  8.7  9.3   Hematocrit 39.0 - 52.0 % 27.2  27.7  30.6   Platelets 150 - 400 K/uL 77  85  110       Latest Ref Rng & Units 12/31/2023    2:28 AM 12/30/2023    5:05 AM 12/29/2023    4:33 AM  CMP  Glucose 70 - 99 mg/dL 97  84  889   BUN 8 - 23 mg/dL 16  19  25    Creatinine 0.61 - 1.24 mg/dL 9.50  9.42  9.26   Sodium 135 - 145 mmol/L 138  136  136   Potassium 3.5 - 5.1 mmol/L 3.3  3.5  4.0   Chloride 98 - 111 mmol/L 109  106  109   CO2 22 - 32 mmol/L 23  23  20    Calcium  8.9 - 10.3 mg/dL 7.9  8.1  7.8   Total Protein 6.5 - 8.1 g/dL 4.9  4.7  5.0   Total Bilirubin 0.0 - 1.2 mg/dL 0.5  0.5  0.8   Alkaline Phos 38 - 126 U/L 86  82  87   AST 15 - 41 U/L 67  49  44   ALT 0 - 44 U/L 40  31  33     Pertinent Imagings/Other Imagings Plain films and CT images have been personally visualized and  interpreted; radiology reports have been reviewed. Decision making incorporated into the Impression / Recommendations.  ECHOCARDIOGRAM COMPLETE Result Date: 12/30/2023    ECHOCARDIOGRAM REPORT   Patient Name:   JERICO GRISSO Date of Exam: 12/30/2023 Medical Rec #:  [id number]          Height:       74.0 in Accession #:    [id number]         Weight:       205.5 lb Date of Birth:  12/21/1945          BSA:          2.199 m Patient Age:    77 years           BP:           122/82 mmHg Patient Gender: M                  HR:           123 bpm. Exam Location:  [facility] Procedure: 2D Echo, Cardiac Doppler, Color Doppler and Intracardiac            Opacification Agent (Both Spectral and Color Flow Doppler were            utilized during procedure). Indications:    Bacteremia R78.81  History:        Patient has prior history of Echocardiogram examinations, most                 recent 02/26/2023. CAD, Aortic Valve Disease, Arrythmias:Atrial                 Fibrillation; Risk Factors:Hypertension, Diabetes and Sleep                 Apnea.                 Aortic Valve: 26 mm Edwards Sapien prosthetic, stented (TAVR)                 valve is present in the aortic position. Procedure Date:                 02/25/23.  Sonographer:    Jayson Gaskins Referring Phys: [id number] DORN FALCON BRANCH  Sonographer Comments: Technically difficult study due to poor echo windows. IMPRESSIONS  1. Left ventricular ejection fraction, by estimation, is >75%. The left ventricle has hyperdynamic function. The left ventricle has no regional wall motion abnormalities. Left ventricular diastolic parameters are indeterminate.  2. RV not well visualized, grossly appears normal in size and function. . Right ventricular systolic function was not well visualized. The right ventricular size is not well visualized.  3. The mitral valve was not well visualized. No evidence of mitral valve regurgitation. No evidence of mitral stenosis.  4. The aortic valve was not well visualized. Aortic valve regurgitation is not visualized. No aortic stenosis is present. 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 Procedure Date: 02/25/23.  5. IVC is small suggesting low RA pressure and hypovolemia. FINDINGS  Left Ventricle: Left ventricular ejection fraction, by estimation, is >75%. The left ventricle has hyperdynamic  function. The left ventricle has no regional wall motion abnormalities. Definity  contrast agent was given IV to delineate the left ventricular endocardial borders. The left ventricular internal cavity size was normal in size. There is no left ventricular hypertrophy. Left ventricular diastolic parameters are indeterminate. Right Ventricle: RV not well visualized, grossly appears normal  in size and function. The right ventricular size is not well visualized. Right vetricular wall thickness was not well visualized. Right ventricular systolic function was not well visualized. Left Atrium: Left atrial size was normal in size. Right Atrium: Right atrial size was not well visualized. Pericardium: There is no evidence of pericardial effusion. Mitral Valve: The mitral valve was not well visualized. No evidence of mitral valve regurgitation. No evidence of mitral valve stenosis. Tricuspid Valve: The tricuspid valve is not well visualized. Tricuspid valve regurgitation is trivial. No evidence of tricuspid stenosis. Aortic Valve: The aortic valve was not well visualized. Aortic valve regurgitation is not visualized. No aortic stenosis is present. Aortic valve mean gradient measures 6.0 mmHg. Aortic valve peak gradient measures 11.0 mmHg. Aortic valve area, by VTI measures 2.33 cm. 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 Procedure Date: 02/25/23. Pulmonic Valve: The pulmonic valve was not well visualized. Pulmonic valve regurgitation is not visualized. No evidence of pulmonic stenosis. Aorta: The aortic root was not well visualized. Venous: IVC is small suggesting low RA pressure and hypovolemia. IAS/Shunts: No atrial level shunt detected by color flow Doppler.  LEFT VENTRICLE PLAX 2D LVIDd:         3.60 cm LVIDs:         2.30 cm LV PW:         1.00 cm LV IVS:        1.05 cm LVOT diam:     2.00 cm LV SV:         62 LV SV Index:   28 LVOT Area:     3.14 cm  RIGHT  VENTRICLE RV S prime:     16.40 cm/s TAPSE (M-mode): 2.4 cm LEFT ATRIUM           Index LA Vol (A4C): 40.2 ml 18.28 ml/m  AORTIC VALVE AV Area (Vmax):    2.32 cm AV Area (Vmean):   2.10 cm AV Area (VTI):     2.33 cm AV Vmax:           165.50 cm/s AV Vmean:          118.000 cm/s AV VTI:            0.267 m AV Peak Grad:      11.0 mmHg AV Mean Grad:      6.0 mmHg LVOT Vmax:         122.00 cm/s LVOT Vmean:        78.700 cm/s LVOT VTI:          0.198 m LVOT/AV VTI ratio: 0.74 MITRAL VALVE MV Area (PHT): 6.32 cm    SHUNTS MV Decel Time: 120 msec    Systemic VTI:  0.20 m MV E velocity: 96.10 cm/s  Systemic Diam: 2.00 cm Dorn Ross MD Electronically signed by Dorn Ross MD Signature Date/Time: 12/30/2023/10:24:20 AM    Final    DG Chest Port 1 View Result Date: 12/28/2023 CLINICAL DATA:  Questionable sepsis - evaluate for abnormality EXAM: PORTABLE CHEST - 1 VIEW COMPARISON:  Sep 22, 2023 FINDINGS: Low lung volumes. Patchy retrocardiac airspace opacities. No pleural effusion or pneumothorax. Moderate cardiomegaly. 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 No acute fracture or destructive lesions. Multilevel thoracic osteophytosis. Osteopenia. IMPRESSION: Patchy retrocardiac airspace opacities, likely atelectasis in the setting of low lung volumes. Alternatively, a developing bronchopneumonia could have this appearance in the correct clinical context. Electronically Signed   By:  Rogelia Myers M.D.   On: 12/28/2023 12:41   I discussed the limitations of evaluation and management by telemedicine. The patient expressed understanding and agreed to proceed.   I discussed the assessment and treatment plan with the patient. The patient was provided an opportunity to ask questions and all were answered. The patient agreed with the plan and demonstrated an understanding of the instructions.   The patient was advised to call back or seek an in-person evaluation if the  symptoms worsen or if the condition fails to improve as anticipated.  I spent 80 minutes involved in face-to-face and non-face-to-face activities for this patient on the day of the visit. Professional time spent includes the following activities: Preparing to see the patient (review of tests), Obtaining and reviewing separately obtained history (EDP, H&P), Performing a medically appropriate evaluation, Ordering medications, referring and communicating with other health care professionals, Documenting clinical information in the EMR, Independently interpreting results (not separately reported) and Care coordination (not separately reported).  Electronically signed by:   Plan d/w requesting provider as well as ID pharm D  Of note, portions of this note may have been created with voice recognition software. While this note has been edited for accuracy, occasional wrong-word or 'sound-a-like' substitutions may have occurred due to the inherent limitations of voice recognition software.   Annalee Orem, MD Infectious Disease Physician [facility] Pager: [phone]

## 2023-12-31 NOTE — Progress Notes (Signed)
 OT Cancellation Note  Patient Details Name: Gary Carroll MRN: [id number] DOB: 1945-08-27   Cancelled Treatment:    Reason Eval/Treat Not Completed: Medical issues which prohibited therapy. Pt still has the femoral line. Will attempt evaluation later as time permits and if pt is medically ready.   Azariel Banik OT, MOT   Gary Carroll 12/31/2023, 9:01 AM

## 2023-12-31 NOTE — Progress Notes (Signed)
 Initial Nutrition Assessment  DOCUMENTATION CODES:   Not applicable  INTERVENTION:   -Continue regular diet -Continue Ensure Plus High Protein po BID, each supplement provides 350 kcal and 20 grams of protein  -Magic cup TID with meals, each supplement provides 290 kcal and 9 grams of protein  -500 mg vitamin C  BID -220 mg zinc  sulfate daily x 14 daily -MVI with minerals daily -Feeding assistance with meals -RD will draw lab to rule out potential micronutrient deficiencies which may be impeding wound healing: vitamin A  NUTRITION DIAGNOSIS:   Increased nutrient needs related to wound healing as evidenced by estimated needs.  GOAL:   Patient will meet greater than or equal to 90% of their needs  MONITOR:   PO intake, Supplement acceptance  REASON FOR ASSESSMENT:   Malnutrition Screening Tool    ASSESSMENT:   Pt with extensive history of severe aortic stenosis s/p TAVR, paroxysmal A-fib on Eliquis , CAD, rectal adenocarcinoma, HTN, HLD, DM2, chronic heart failure HFpEF, presented from nursing home with chief complaint of generalized weaknesses, fever, confusion.  Pt admitted with sepsis (UTI vs pneumonia) and CHF.   Reviewed I/O's: -531 ml x 24 hours and +6.8 L since admission  UOP: 1.1 L x 24 hours  Pt unavailable at time of visit. Attempted to speak with pt via call to hospital room phone, however, unable to reach. RD unable to obtain further nutrition-related history or complete nutrition-focused physical exam at this time.    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   Pt currently on a regular diet. Noted poor oral intake; meal completions 25-50%. Pt with variable acceptance of Ensure supplements.   Reviewed wt hx; pt has experienced a 8.2% wt loss over the past 3 months, which is significant for time frame. Per nursing assessment, pt with generalized edema which  may be masking true weight loss as well as fat and muscle depletions. Pt is at risk for malnutrition, however, unable to identify at this time.   Per palliative care notes, pt with declining health over the past 6 months, including recurrent hospitalizations within the past 3 months. Pt has also required SNF rehab and has been making slow progress. Plan for full scope, full code with timed trial of CPR and intubation and transition to comfort care if prognosis is poor. Plan for outpatient palliative referral.   Medications reviewed and include melatonin and potassium chloride .   Per TOC notes, pt was from [facility] P[facility]. Plan to transition to SNF [facility]) at d/c.   Lab Results  Component Value Date   HGBA1C 6.1 (H) 08/21/2023   PTA DM medications are 500 mg metformin  BID.   Labs reviewed: K: 3.3, CBGS: 83-101 (inpatient orders for glycemic control are none).    Diet Order:   Diet Order             Diet regular Room service appropriate? Yes; Fluid consistency: Thin  Diet effective now                   EDUCATION NEEDS:   No education needs have been identified at this time  Skin:  Skin Assessment: Skin Integrity Issues: Skin Integrity Issues:: Other (Comment), Stage III Stage III: buttocks Other: full thickness wounds to rectum and lt pretibial  Last BM:  12/30/23 (type 7)  Height:   Ht Readings from Last 1 Encounters:  12/28/23 6' 2 (1.88 m)    Weight:   Wt Readings from Last 1 Encounters:  12/31/23 98.2 kg    Ideal Body Weight:  86.4 kg  BMI:  Body mass index is 27.8 kg/m.  Estimated Nutritional Needs:   Kcal:  2200-2400  Protein:  115-130 grams  Fluid:  2.0-2.2 L    Margery ORN, RD, LDN, CDCES Registered Dietitian III Certified Diabetes Care and Education Specialist If unable to reach this RD, please use RD Inpatient group chat on secure chat between hours of 8am-4 pm daily

## 2023-12-31 NOTE — Progress Notes (Signed)
 Peripherally Inserted Central Catheter Placement  The IV Nurse has discussed with the patient and/or persons authorized to consent for the patient, the purpose of this procedure and the potential benefits and risks involved with this procedure.  The benefits include less needle sticks, lab draws from the catheter, and the patient may be discharged home with the catheter. Risks include, but not limited to, infection, bleeding, blood clot (thrombus formation), and puncture of an artery; nerve damage and irregular heartbeat and possibility to perform a PICC exchange if needed/ordered by physician.  Alternatives to this procedure were also discussed.  Bard Power PICC patient education guide, fact sheet on infection prevention and patient information card has been provided to patient /or left at bedside.    PICC Placement Documentation  PICC Double Lumen 12/31/23 Right Brachial 41 cm 0 cm (Active)  Indication for Insertion or Continuance of Line Limited venous access - need for IV therapy >5 days (PICC only) 12/31/23 1443  Exposed Catheter (cm) 0 cm 12/31/23 1443  Site Assessment Clean, Dry, Intact 12/31/23 1443  Lumen #1 Status Flushed;Saline locked;Blood return noted 12/31/23 1443  Lumen #2 Status Flushed;Saline locked;Blood return noted 12/31/23 1443  Dressing Type Transparent;Securing device 12/31/23 1443  Dressing Status Antimicrobial disc/dressing in place;Clean, Dry, Intact 12/31/23 1443  Line Care Connections checked and tightened 12/31/23 1443  Dressing Intervention New dressing;Adhesive placed at insertion site (IV team only) 12/31/23 1443  Dressing Change Due 01/07/24 12/31/23 1443       Tifanie Gardiner  Con 12/31/2023, 2:46 PM

## 2023-12-31 NOTE — Progress Notes (Signed)
 Daily Progress Note   Patient Name: Gary Carroll       Date: 12/31/2023 DOB: [birth year]  Age: 78 y.o. MRN#: [id number] Attending Physician: [name] Afton CROME, MD Primary Care Physician: [facility] Admit Date: 12/28/2023  Reason for Consultation/Follow-up: Establishing goals of care  Subjective:  78 y.o. male  with past medical history of severe aortic stenosis s/p TAVR, paroxysmal A-fib on Eliquis , CAD, rectal adenocarcinoma, HTN, HLD, DM2, chronic heart failure HFpEF admitted on 12/28/2023 with generalized weakness, confusion, and fever.   Workup revealed findings concerning for sepsis. He was admitted to stepdown and treated with IV fluids and IV abx.  Currently requiring vasopressor support.   Patient has had declining health over the past few months.  He has had recurrent hospitalizations with 3 in the past 6 months.  He is also required skilled nursing facility rehabilitation stays and unfortunately has not been able to recover enough function to return home safely.   12/29/2023: Initial consult completed.  Patient had great difficulty making decisions as a relates to his healthcare.  While he did indicate he would never want to be bedbound or functionally dependent, he did not want to make any changes to his advance directives or CODE STATUS at this time.  He wanted to reach out to one of his friends Dorisann Point) to see if she could provide any assistance.  He was agreeable for me to call her and give her an update.  Unfortunately, she expresses that she does not feel comfortable making medical decisions for him.   12/30/2023: Revisited goals of care and advanced directives.  Shared concerns regarding patient's long-term prognosis given current and recent decline in his health.  We discussed the limitations and  potential burdens of CPR and intubation in the context of those with advanced age and serious underlying health conditions.  Patient has great difficulty making decisions.  He was very distracted.  He did indicate that he would consider a time-limited trial of CPR and mechanical ventilation but that if his prognosis is poor he would want to be transition to comfort care.  I updated his friend Dorisann Point) about her conversation and she agrees with honoring his wishes as full code/full scope for the time being with trial of CPR mechanical ventilation but transition to comfort care prognosis is poor.  Patient and caregiver agreeable to outpatient palliative referral at discharge.    Today, labs reviewed.  Mild hypokalemia noted compared to yesterday.  Renal function appears stable.  Continues to have some hypocalcemia.  BNP slightly elevated compared to previous at 286 (12/30/2023: 273, but improved  since admission 12/28/2023: 583).  Albumin continues to be low at 1.8.  AST minimally elevated but other transaminases being normal.  Total bilirubin also normal.  White blood cell count normal.  Continues to be anemic with slight decline in hemoglobin today compared to yesterday with hemoglobin of 8.6 g/dL compared to 8.7 g/dL on 1/87/7974.  Admission hemoglobin 11.9 g/dL.  Will need to trend.  Blood cultures negative to date.  Echocardiogram completed EF greater than 75% with hyperdynamic function.  TAVR valve present and appears to be in appropriate position.  I do not see any mention of abnormalities concerning for vegetations within the heart.  Vital signs reviewed, blood pressure stable off pressors but patient noted to have some tachycardia.  Patient indicates he is doing okay.  He states he feels a little bit better today.  He denies any pain, shortness of breath, nausea, or vomiting.  Independent history obtained from nursing staff as patient has waxing and waning cognition.  Nursing staff state that  patient continues to be ill and had some tachycardia today for which they administered Cardizem  in an attempt at rate control.  Overall, feel his health is largely unchanged from previous.   I also spoke with patient's friend Dorisann Point).  I provided her with an update.  She is thankful for the update regarding his medical care.  She states that she is working with the facility to try and get his finances straightened out so that he can be placed long-term.  Chart review/care coordination:  Completed extensive chart review including EPIC notes, labs (independently reviewed), medication administration record, vital signs. Coordinated care with bedside nursing staff, [facility], admitting MD, and patient's friend.   Length of Stay: 3   Physical Exam Constitutional:      General: He is not in acute distress.    Appearance: He is not toxic-appearing.     Comments: Drowsy but easily arousable and remains awake throughout the exam, chronically ill-appearing.  Pulmonary:     Effort: Pulmonary effort is normal. No respiratory distress.  Skin:    General: Skin is warm and dry.  Neurological:     Comments: Patient is talkative but easily distracted and requires much reorientation to remain focused on current topic, often speaks of traveling to [place] and seeing the world.               Vital Signs: BP 121/81   Pulse (!) 126   Temp (!) 97.5 F (36.4 C) (Oral)   Resp (!) 21   Ht 6' 2 (1.88 m)   Wt 98.2 kg   SpO2 99%   BMI 27.80 kg/m  SpO2: SpO2: 99 % O2 Device: O2 Device: Room Air O2 Flow Rate: O2 Flow Rate (L/min): 2 L/min      Palliative Assessment/Data: 40%   Palliative Care Assessment & Plan   Patient Profile/Assessment:  78 year old male with multiple medical problems admitted for sepsis. Patient has had increased frequency of hospitalizations and significant decline in function during the past 6 months. Goals of care and advance care planning discussions have been completed.  Patient has very little family support. He does have several friends who help support him.  Mental status is waxing and waning.  Through several days of goals of care discussion/anticipatory care/advanced directive planning discussion, it was determined that patient desired trial of CPR and ventilation but if prognosis was poor preferred to be transition to comfort care.  Initially, patient's friend Yetta had indicated she did not feel  comfortable making medical decisions for him.  However, after discussing his expressed goals and wishes, she agrees that if needed she can assist with making medical decisions about withdrawing life support.   Recommendations/Plan:  Full code/full scope, time-limited trial of CPR and intubation with withdrawal and transition to comfort care if prognosis poor Outpatient palliative referral Continue current symptom regimen Inpatient palliative team to continue to follow for ongoing goals of care/anticipatory care plan discussions and symptom management  Symptom management:  Continue Tylenol  650 mg every 6 hours as needed (last dose 08/13) Continue hydromorphone  0.5 to 1 mg every 2 hours as needed (none needed thus far) Continue Zofran  4 mg every 6 hours as needed (not needed thus far) Continue oxycodone  5 mg every 4 hours as needed (last dose 12/28/2023) Continue current bowel regimen Continue trazodone  25 mg nightly as needed (not needed thus far)  Prognosis:   Poor/guarded     Discharge Planning: To Be Determined, likely SNF for LTC (potentially some STR initially) with OP Pall support.    Detailed review of medical records (labs, imaging, vital signs), medically appropriate exam, discussed with treatment team, counseling and education to patient, family, & staff, documenting clinical information, medication management, coordination of care   Total time:  I spent 40 minutes in the care of the patient today in the above activities and documenting  the encounter.   Laymon CHRISTELLA Pinal, NP  Palliative Medicine Team Team phone # [phone]  Thank you for allowing the Palliative Medicine Team to assist in the care of this patient. Please utilize secure chat with additional questions, if there is no response within 30 minutes please call the above phone number.  Palliative Medicine Team providers are available by phone from 7am to 7pm daily and can be reached through the team cell phone.  Should this patient require assistance outside of these hours, please call the patient's attending physician.

## 2023-12-31 NOTE — Progress Notes (Addendum)
 PROGRESS NOTE   Dia Jefferys Racca  FMW:[id number] DOB: 28-May-1945 DOA: 12/28/2023 PCP: [facility]   Chief Complaint  Patient presents with   Code Sepsis   Level of care: ICU  Brief Admission History:  MOHANNAD OLIVERO is a 78 year old with extensive history of severe aortic stenosis s/p TAVR, paroxysmal A-fib on Eliquis , CAD, rectal adenocarcinoma, HTN, HLD, DM2, chronic heart failure HFpEF, presented to ED from nursing home with chief complaint of generalized weaknesses, fever, confusion.  Patient is poor historian at this point-electronic record review reveals patient has had multiple hospitalization for multiple comorbidities last discharged on 09/30/2023 after treatment for sepsis, UTI secondary to E. coli, Enterobacter, E. coli bacteremia COVID infection in April, E. coli sepsis.  Labs: CMP potassium 5.3, BUN 28, creatinine 1.54, albumin 2.6, AST 63 ALT 47, lactic acid 5.0 CBC WBC 15.3, hemoglobin 11.9, neutrophil 15.0, INR 2.4, Respiratory panel-influenza A, B, COVID-all negative UA amber, moderate leukocytes, negative for nitrites, bacteria none, WBC > 50  Chest x-ray:  Patchy retrocardiac airspace opacities, likely atelectasis in the setting of low lung volumes. Alternatively, a developing bronchopneumonia could have this appearance in the correct clinical context.  Requested patient to be admitted, as patient meets sepsis criteria-likely source UTI with low probability of pneumonia  Sepsis protocol initiated, IV fluids, broad-spectrum antibiotics cultures been obtained   Assessment and Plan:  Sepsis - urinary source, UTI POA: Met sepsis criteria -admitted to stepdown unit under close observation monitor -Initiated sepsis protocol, aggressive IV fluid resuscitation, IV penicillin  per ID  -Will follow-up with blood and urine cultures--Group C Strep positive infection - Lactic acidosis-  LA 5.0 trended down to 1.7 with treatments   Adjustment disorder with  depressed mood - treating supportively  Type 2 diabetes mellitus, controlled - Holding home medication of metformin  -Checking CBG q. ACHS, SSI coverage -Check an A1c:  (last A1c 4 months ago 6.1) CBG (last 3)  Recent Labs    12/29/23 0738 12/30/23 0728 12/31/23 0727  GLUCAP 106* 83 101*   Disease of anus and rectum, unspecified - History of rectal adenocarcinoma with chronic fissure - Continue wound care - No signs of infection  Insomnia - Continue home medication melatonin - As needed trazodone   Morbid obesity Body mass index is 34.17 kg/m. 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 with unspecified severity  - Continue wound care per nursing  S/P TAVR (transcatheter aortic valve replacement) - Stable continue meds - discussed with ID and cardiology, planning for TEE early next week  (HFpEF) heart failure with preserved ejection fraction  - POA euvolemic - Last Echo 12/ 2024 EF 60-65%, normal LV function moderate concentric LVH normal right ventricular systolic function, normal mitral and tricuspid valve repair replaced aortic valve  Due to sepsis we will continue IV fluid resuscitation  - Will monitor I's and O's, daily weight  Intake/Output Summary (Last 24 hours) at 12/31/2023 1411 Last data filed at 12/31/2023 0400 Gross per 24 hour  Intake --  Output 1050 ml  Net -1050 ml   CAD (coronary artery disease) - Currently stable -Continue home medication including beta-blocker, statins, apixaban  calcium  channel  Aortic stenosis - History of severe aortic stenosis, s/p TAVR Successful TAVR with 26 mm Edwards SAPIEN 3 THV on 02/25/2023   Elevated transaminase level - Likely exacerbated by sepsis, on statins -Continue IV fluids, holding statins -Will avoid hepatotoxins  Benign prostatic hyperplasia with lower urinary tract symptoms -  Monitoring for urinary retention -Temporary Foley catheter -Resuming home meds including oxybutynin   AF (paroxysmal atrial fibrillation)  - Rate controlled with diltiazem , metoprolol  continuing apixaban   Gastroesophageal reflux disease without esophagitis - Continue PPI  AKI (acute kidney injury) -- Resolved - Monitoring BUN/creatinine closely likely elevated due to sepsis, hypovolemia hypotension - Avoid nephrotoxins, hypotension - Continue IVF  Hyperlipidemia, unspecified - on statins -holding rosuvastatin  For transaminitis - Will monitor LFTs  Essential hypertension - Currently in sepsis, ruling out septic shock -Holding BP medications including losartan   Hypokalemia -- check Mg -- replete   DVT prophylaxis: apixaban  Code Status: Full  Family Communication:  Disposition:    Consultants:   Procedures:   Antimicrobials:    Subjective: Pt upset about spilling juice on his clothing this morning.   Objective: Vitals:   12/31/23 1100 12/31/23 1144 12/31/23 1200 12/31/23 1300  BP: 131/76  (!) 148/98 134/80  Pulse: (!) 105  92 93  Resp: (!) 22  20 (!) 24  Temp:  98.5 F (36.9 C)    TempSrc:  Axillary    SpO2: 100%  95% 100%  Weight:      Height:        Intake/Output Summary (Last 24 hours) at 12/31/2023 1403 Last data filed at 12/31/2023 0400 Gross per 24 hour  Intake --  Output 1050 ml  Net -1050 ml   Filed Weights   12/28/23 1457 12/29/23 0611 12/31/23 0451  Weight: 93.3 kg 93.2 kg 98.2 kg   Examination:  General exam: Appears calm and comfortable  Respiratory system: Clear to auscultation. Respiratory effort normal. Cardiovascular system: normal S1 & S2 heard. No JVD, murmurs, rubs, gallops or clicks. No pedal edema. Gastrointestinal system: Abdomen is nondistended, soft and nontender. No organomegaly or masses felt. Normal bowel sounds heard. Central nervous system: Alert and oriented. No focal neurological deficits. Extremities: 2+ edema BLEs  Symmetric 5 x 5 power. Skin: No rashes, lesions or ulcers. Psychiatry: Judgement and insight appear normal. Mood & affect appropriate.   Data Reviewed: I have personally reviewed following labs and imaging studies  CBC: Recent Labs  Lab 12/28/23 1225 12/28/23 2239 12/29/23 0433 12/30/23 0505 12/31/23 0228  WBC 15.3* 22.2* 17.7* 7.4 6.3  NEUTROABS 15.0*  --   --   --   --   HGB 11.9* 9.2* 9.3* 8.7* 8.6*  HCT 38.3* 29.7* 30.6* 27.7* 27.2*  MCV 95.3 96.1 96.2 96.5 93.8  PLT 159 110* 110* 85* 77*    Basic Metabolic Panel: Recent Labs  Lab 12/28/23 1221 12/28/23 1225 12/28/23 2239 12/29/23 0433 12/30/23 0505 12/31/23 0228  NA  --  136 135 136 136 138  K  --  5.3* 4.4 4.0 3.5 3.3*  CL  --  102 106 109 106 109  CO2  --  19* 18* 20* 23 23  GLUCOSE  --  95 115* 110* 84 97  BUN  --  28* 25* 25* 19 16  CREATININE  --  1.54* 0.83 0.73 0.57* 0.49*  CALCIUM   --  8.8* 7.6* 7.8* 8.1* 7.9*  MG 2.0  --   --   --   --   --   PHOS 4.5  --   --   --   --   --     CBG: Recent Labs  Lab 12/28/23 1940 12/29/23 0738 12/30/23 0728 12/31/23 0727  GLUCAP 90 106* 83 101*    Recent Results (from the  past 240 hours)  Resp panel by RT-PCR (RSV, Flu A&B, Covid) Anterior Nasal Swab     Status: None   Collection Time: 12/28/23 11:52 AM   Specimen: Anterior Nasal Swab  Result Value Ref Range Status   SARS Coronavirus 2 by RT PCR NEGATIVE NEGATIVE Final    Comment: (NOTE) SARS-CoV-2 target nucleic acids are NOT DETECTED.  The SARS-CoV-2 RNA is generally detectable in upper respiratory specimens during the acute phase of infection. The lowest concentration of SARS-CoV-2 viral copies this assay can detect is 138 copies/mL. A negative result does not preclude SARS-Cov-2 infection and should not be used as the sole basis for treatment or other patient management decisions. A negative result may occur with  improper specimen collection/handling, submission of specimen other than  nasopharyngeal swab, presence of viral mutation(s) within the areas targeted by this assay, and inadequate number of viral copies(<138 copies/mL). A negative result must be combined with clinical observations, patient history, and epidemiological information. The expected result is Negative.  Fact Sheet for Patients:  BloggerCourse.com  Fact Sheet for Healthcare Providers:  SeriousBroker.it  This test is no t yet approved or cleared by the [place]  FDA and  has been authorized for detection and/or diagnosis of SARS-CoV-2 by FDA under an Emergency Use Authorization (EUA). This EUA will remain  in effect (meaning this test can be used) for the duration of the COVID-19 declaration under Section 564(b)(1) of the Act, 21 U.S.C.section 360bbb-3(b)(1), unless the authorization is terminated  or revoked sooner.       Influenza A by PCR NEGATIVE NEGATIVE Final   Influenza B by PCR NEGATIVE NEGATIVE Final    Comment: (NOTE) The Xpert Xpress SARS-CoV-2/FLU/RSV plus assay is intended as an aid in the diagnosis of influenza from Nasopharyngeal swab specimens and should not be used as a sole basis for treatment. Nasal washings and aspirates are unacceptable for Xpert Xpress SARS-CoV-2/FLU/RSV testing.  Fact Sheet for Patients: BloggerCourse.com  Fact Sheet for Healthcare Providers: SeriousBroker.it  This test is not yet approved or cleared by the [place]  FDA and has been authorized for detection and/or diagnosis of SARS-CoV-2 by FDA under an Emergency Use Authorization (EUA). This EUA will remain in effect (meaning this test can be used) for the duration of the COVID-19 declaration under Section 564(b)(1) of the Act, 21 U.S.C. section 360bbb-3(b)(1), unless the authorization is terminated or revoked.     Resp Syncytial Virus by PCR NEGATIVE NEGATIVE Final    Comment:  (NOTE) Fact Sheet for Patients: BloggerCourse.com  Fact Sheet for Healthcare Providers: SeriousBroker.it  This test is not yet approved or cleared by the [place]  FDA and has been authorized for detection and/or diagnosis of SARS-CoV-2 by FDA under an Emergency Use Authorization (EUA). This EUA will remain in effect (meaning this test can be used) for the duration of the COVID-19 declaration under Section 564(b)(1) of the Act, 21 U.S.C. section 360bbb-3(b)(1), unless the authorization is terminated or revoked.  Performed at [facility], [address], [place], [place] [address]   Urine Culture     Status: Abnormal (Preliminary result)   Collection Time: 12/28/23 11:53 AM   Specimen: Urine, Random  Result Value Ref Range Status   Specimen Description   Final    URINE, RANDOM Performed at [facility], [address], [place], [place] [address]    Special Requests   Final    NONE Reflexed from [id number] Performed at [facility], [address], [place], [place] [address]  Culture 40,000 COLONIES/mL YEAST (A)  Final   Report Status PENDING  Incomplete  Blood Culture (routine x 2)     Status: Abnormal   Collection Time: 12/28/23 12:25 PM   Specimen: Right Antecubital; Blood  Result Value Ref Range Status   Specimen Description   Final    RIGHT ANTECUBITAL BOTTLES DRAWN AEROBIC AND ANAEROBIC Performed at [facility], [address] [place], [place] [address]    Special Requests   Final    Blood Culture adequate volume Performed at [facility], [address] [place], [place] [address]    Culture  Setup Time   Final    GRAM POSITIVE COCCI IN BOTH AEROBIC AND ANAEROBIC BOTTLES Gram Stain Report Called to,Read Back By and Verified With: C. KINDLEY ON 12/28/2023 @8 :20PM BY T.HAMER  Performed at [facility], [address] [place], [place] [address]    Culture (A)  Final    STREPTOCOCCUS GROUP C SUSCEPTIBILITIES PERFORMED  ON PREVIOUS CULTURE WITHIN THE LAST 5 DAYS. Performed at [facility], [address], [place], [place] [address]    Report Status 12/30/2023 FINAL  Final  Blood Culture (routine x 2)     Status: Abnormal   Collection Time: 12/28/23 12:25 PM   Specimen: Left Antecubital; Blood  Result Value Ref Range Status   Specimen Description   Final    LEFT ANTECUBITAL BOTTLES DRAWN AEROBIC AND ANAEROBIC Performed at [facility], [address], [place], [place] [address]    Special Requests   Final    Blood Culture adequate volume Performed at [facility], [address] [place], [place] [address]    Culture  Setup Time   Final    GRAM POSITIVE COCCI Gram Stain Report Called to,Read Back By and Verified With: KINDLEY,C @ 2020 ON 12/28/23 BY [facility] IN BOTH AEROBIC AND ANAEROBIC BOTTLES GS DONE @ [facility] CRITICAL RESULT CALLED TO, READ BACK BY AND VERIFIED WITHBETHA JAYSON LINGER RN 12/29/2023 @ 0010 BY AB Performed at [facility], [address], [place], [place] [address]    Culture STREPTOCOCCUS GROUP C (A)  Final   Report Status 12/30/2023 FINAL  Final   Organism ID, Bacteria STREPTOCOCCUS GROUP C  Final      Susceptibility   Streptococcus group c - MIC*    CLINDAMYCIN RESISTANT Resistant     AMPICILLIN <=0.25 SENSITIVE Sensitive     ERYTHROMYCIN >=8 RESISTANT Resistant     VANCOMYCIN  0.5 SENSITIVE Sensitive     CEFTRIAXONE  <=0.12 SENSITIVE Sensitive     LEVOFLOXACIN 0.5 SENSITIVE Sensitive     PENICILLIN  <=0.06 SENSITIVE Sensitive     * STREPTOCOCCUS GROUP C  Blood Culture ID Panel (Reflexed)     Status: Abnormal   Collection Time: 12/28/23 12:25 PM  Result Value Ref Range Status   Enterococcus faecalis NOT DETECTED NOT DETECTED Final   Enterococcus Faecium NOT DETECTED NOT DETECTED Final   Listeria monocytogenes NOT DETECTED NOT DETECTED Final   Staphylococcus species NOT DETECTED NOT DETECTED Final   Staphylococcus aureus (BCID) NOT DETECTED NOT DETECTED Final   Staphylococcus  epidermidis NOT DETECTED NOT DETECTED Final   Staphylococcus lugdunensis NOT DETECTED NOT DETECTED Final   Streptococcus species DETECTED (A) NOT DETECTED Final    Comment: Not Enterococcus species, Streptococcus agalactiae, Streptococcus pyogenes, or Streptococcus pneumoniae. CRITICAL RESULT CALLED TO, READ BACK BY AND VERIFIED WITH: C KINDLEY RN 12/29/2023 @ 0010 BY AB    Streptococcus agalactiae NOT DETECTED NOT DETECTED Final  Streptococcus pneumoniae NOT DETECTED NOT DETECTED Final   Streptococcus pyogenes NOT DETECTED NOT DETECTED Final   A.calcoaceticus-baumannii NOT DETECTED NOT DETECTED Final   Bacteroides fragilis NOT DETECTED NOT DETECTED Final   Enterobacterales NOT DETECTED NOT DETECTED Final   Enterobacter cloacae complex NOT DETECTED NOT DETECTED Final   Escherichia coli NOT DETECTED NOT DETECTED Final   Klebsiella aerogenes NOT DETECTED NOT DETECTED Final   Klebsiella oxytoca NOT DETECTED NOT DETECTED Final   Klebsiella pneumoniae NOT DETECTED NOT DETECTED Final   Proteus species NOT DETECTED NOT DETECTED Final   Salmonella species NOT DETECTED NOT DETECTED Final   Serratia marcescens NOT DETECTED NOT DETECTED Final   Haemophilus influenzae NOT DETECTED NOT DETECTED Final   Neisseria meningitidis NOT DETECTED NOT DETECTED Final   Pseudomonas aeruginosa NOT DETECTED NOT DETECTED Final   Stenotrophomonas maltophilia NOT DETECTED NOT DETECTED Final   Candida albicans NOT DETECTED NOT DETECTED Final   Candida auris NOT DETECTED NOT DETECTED Final   Candida glabrata NOT DETECTED NOT DETECTED Final   Candida krusei NOT DETECTED NOT DETECTED Final   Candida parapsilosis NOT DETECTED NOT DETECTED Final   Candida tropicalis NOT DETECTED NOT DETECTED Final   Cryptococcus neoformans/gattii NOT DETECTED NOT DETECTED Final    Comment: Performed at [facility], [address] [place], [place] [address]  MRSA Next Gen by PCR, Nasal     Status: Abnormal   Collection Time:  12/28/23  2:30 PM   Specimen: Nasal Mucosa; Nasal Swab  Result Value Ref Range Status   MRSA by PCR Next Gen DETECTED (A) NOT DETECTED Final    Comment: RESULT CALLED TO, READ BACK BY AND VERIFIED WITH: A SHELTON AT 1743 ON [id number] BY S DALTON (NOTE) The GeneXpert MRSA Assay (FDA approved for NASAL specimens only), is one component of a comprehensive MRSA colonization surveillance program. It is not intended to diagnose MRSA infection nor to guide or monitor treatment for MRSA infections. Test performance is not FDA approved in patients less than 22 years old. Performed at [facility], [address], [place], [place] [address]   Culture, blood (Routine X 2) w Reflex to ID Panel     Status: None (Preliminary result)   Collection Time: 12/29/23 11:31 PM   Specimen: BLOOD  Result Value Ref Range Status   Specimen Description BLOOD LEFT ANTECUBITAL  Final   Special Requests   Final    BOTTLES DRAWN AEROBIC AND ANAEROBIC Blood Culture adequate volume   Culture   Final    NO GROWTH 2 DAYS Performed at [facility], [address] [place], [place] [address]    Report Status PENDING  Incomplete  Culture, blood (Routine X 2) w Reflex to ID Panel     Status: None (Preliminary result)   Collection Time: 12/29/23 11:32 PM   Specimen: BLOOD LEFT HAND  Result Value Ref Range Status   Specimen Description BLOOD LEFT HAND  Final   Special Requests   Final    AEROBIC BOTTLE ONLY Blood Culture results may not be optimal due to an inadequate volume of blood received in culture bottles   Culture   Final    NO GROWTH 2 DAYS Performed at [facility], [address], [place], [place] [address]    Report Status PENDING  Incomplete     Radiology Studies: US  EKG SITE RITE Result Date: 12/31/2023 If Site Rite image not attached, placement could not be confirmed due to current cardiac rhythm.  ECHOCARDIOGRAM COMPLETE  Result Date: 12/30/2023    ECHOCARDIOGRAM REPORT   Patient Name:   CRISTOFER YAFFE Date of Exam: 12/30/2023 Medical Rec #:  [id number]          Height:       74.0 in Accession #:    [id number]         Weight:       205.5 lb Date of Birth:  01-Mar-1946         BSA:          2.199 m Patient Age:    77 years           BP:           122/82 mmHg Patient Gender: M                  HR:           123 bpm. Exam Location:  [name] Procedure: 2D Echo, Cardiac Doppler, Color Doppler and Intracardiac            Opacification Agent (Both Spectral and Color Flow Doppler were            utilized during procedure). Indications:    Bacteremia R78.81  History:        Patient has prior history of Echocardiogram examinations, most                 recent 02/26/2023. CAD, Aortic Valve Disease, Arrythmias:Atrial                 Fibrillation; Risk Factors:Hypertension, Diabetes and Sleep                 Apnea.                 Aortic Valve: 26 mm Edwards Sapien prosthetic, stented (TAVR)                 valve is present in the aortic position. Procedure Date:                 02/25/23.  Sonographer:    Jayson Gaskins Referring Phys: [id number] DORN FALCON BRANCH  Sonographer Comments: Technically difficult study due to poor echo windows. IMPRESSIONS  1. Left ventricular ejection fraction, by estimation, is >75%. The left ventricle has hyperdynamic function. The left ventricle has no regional wall motion abnormalities. Left ventricular diastolic parameters are indeterminate.  2. RV not well visualized, grossly appears normal in size and function. . Right ventricular systolic function was not well visualized. The right ventricular size is not well visualized.  3. The mitral valve was not well visualized. No evidence of mitral valve regurgitation. No evidence of mitral stenosis.  4. The aortic valve was not well visualized. Aortic valve regurgitation is not visualized. No aortic stenosis is present. 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 Procedure Date: 02/25/23.  5. IVC is  small suggesting low RA pressure and hypovolemia. FINDINGS  Left Ventricle: Left ventricular ejection fraction, by estimation, is >75%. The left ventricle has hyperdynamic function. The left ventricle has no regional wall motion abnormalities. Definity  contrast agent was given IV to delineate the left ventricular endocardial borders. The left ventricular internal cavity size was normal in size. There is no left ventricular hypertrophy. Left ventricular diastolic parameters are indeterminate. Right Ventricle: RV not well visualized, grossly appears normal in size and function. The right ventricular size is not well visualized. Right vetricular wall thickness was  not well visualized. Right ventricular systolic function was not well visualized. Left Atrium: Left atrial size was normal in size. Right Atrium: Right atrial size was not well visualized. Pericardium: There is no evidence of pericardial effusion. Mitral Valve: The mitral valve was not well visualized. No evidence of mitral valve regurgitation. No evidence of mitral valve stenosis. Tricuspid Valve: The tricuspid valve is not well visualized. Tricuspid valve regurgitation is trivial. No evidence of tricuspid stenosis. Aortic Valve: The aortic valve was not well visualized. Aortic valve regurgitation is not visualized. No aortic stenosis is present. Aortic valve mean gradient measures 6.0 mmHg. Aortic valve peak gradient measures 11.0 mmHg. Aortic valve area, by VTI measures 2.33 cm. 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 Procedure Date: 02/25/23. Pulmonic Valve: The pulmonic valve was not well visualized. Pulmonic valve regurgitation is not visualized. No evidence of pulmonic stenosis. Aorta: The aortic root was not well visualized. Venous: IVC is small suggesting low RA pressure and hypovolemia. IAS/Shunts: No atrial level shunt detected by color flow Doppler.  LEFT VENTRICLE PLAX 2D LVIDd:          3.60 cm LVIDs:         2.30 cm LV PW:         1.00 cm LV IVS:        1.05 cm LVOT diam:     2.00 cm LV SV:         62 LV SV Index:   28 LVOT Area:     3.14 cm  RIGHT VENTRICLE RV S prime:     16.40 cm/s TAPSE (M-mode): 2.4 cm LEFT ATRIUM           Index LA Vol (A4C): 40.2 ml 18.28 ml/m  AORTIC VALVE AV Area (Vmax):    2.32 cm AV Area (Vmean):   2.10 cm AV Area (VTI):     2.33 cm AV Vmax:           165.50 cm/s AV Vmean:          118.000 cm/s AV VTI:            0.267 m AV Peak Grad:      11.0 mmHg AV Mean Grad:      6.0 mmHg LVOT Vmax:         122.00 cm/s LVOT Vmean:        78.700 cm/s LVOT VTI:          0.198 m LVOT/AV VTI ratio: 0.74 MITRAL VALVE MV Area (PHT): 6.32 cm    SHUNTS MV Decel Time: 120 msec    Systemic VTI:  0.20 m MV E velocity: 96.10 cm/s  Systemic Diam: 2.00 cm Dorn Ross MD Electronically signed by Dorn Ross MD Signature Date/Time: 12/30/2023/10:24:20 AM    Final     Scheduled Meds:  apixaban   5 mg Oral BID   Chlorhexidine  Gluconate Cloth  6 each Topical Q0600   clotrimazole -betamethasone   1 Application Topical BID   diltiazem   240 mg Oral Daily   feeding supplement  237 mL Oral BID BM   lactobacillus  1 g Oral TID WC   leptospermum manuka honey  1 Application Topical Daily   liver oil-zinc  oxide   Topical BID   melatonin  9 mg Oral QHS   metoprolol  tartrate  25 mg Oral BID   midodrine   5 mg Oral TID WC   mupirocin  ointment   Nasal BID   oxybutynin   5 mg Oral QPM   sodium chloride  flush  3 mL Intravenous Q12H   sodium chloride  flush  3 mL Intravenous Q12H   Continuous Infusions:  penicillin  G potassium 24 Million Units in dextrose  5 % 500 mL CONTINUOUS infusion      LOS: 3 days   Critical Care Procedure Note Authorized and Performed by: KYM Louder MD  Total Critical Care time:  62 mins Due to a high probability of clinically significant, life threatening deterioration, the patient required my highest level of preparedness to intervene emergently and I  personally spent this critical care time directly and personally managing the patient.  This critical care time included obtaining a history; examining the patient, pulse oximetry; ordering and review of studies; arranging urgent treatment with development of a management plan; evaluation of patient's response of treatment; frequent reassessment; and discussions with other providers.  This critical care time was performed to assess and manage the high probability of imminent and life threatening deterioration that could result in multi-organ failure.  It was exclusive of separately billable procedures and treating other patients and teaching time.   Afton Louder, MD How to contact the [facility] Attending or Consulting provider 7A - 7P or covering provider during after hours 7P -7A, for this patient?  Check the care team in [facility] and look for a) attending/consulting [facility] provider listed and b) the [facility] team listed Log into [email or web address] to find provider on call.  Locate the [facility] provider you are looking for under [facility] and page to a number that you can be directly reached. If you still have difficulty reaching the provider, please page the [facility] (Director on Call) for the Hospitalists listed on amion for assistance.  12/31/2023, 2:03 PM

## 2023-12-31 NOTE — Plan of Care (Signed)

## 2023-12-31 NOTE — Consult Note (Signed)
 [facility]                                                                                       Patient Identification: Patient Name: Gary Carroll MRN: [id number] Admit Date: 12/28/2023 11:35 AM Today's Date: 12/31/2023 Reason for consult: Bacteremia Requesting provider: Dr Willette  Location: Patient: Gary Carroll Provider: Darryle Law   Principal Problem:   Septic shock [facility]) Active Problems:   Essential hypertension   Hyperlipidemia, unspecified   AKI (acute kidney injury) (HCC)   Gastroesophageal reflux disease without esophagitis   AF (paroxysmal atrial fibrillation) (HCC)   Benign prostatic hyperplasia with lower urinary tract symptoms   Urinary tract infection   Elevated transaminase level   Aortic stenosis   CAD (coronary artery disease)   (HFpEF) heart failure with preserved ejection fraction (HCC)   S/P TAVR (transcatheter aortic valve replacement)   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 with unspecified severity (HCC)   Morbid obesity (HCC)   Insomnia   Disease of anus and rectum, unspecified   Chronic venous insufficiency   Complication of diabetes mellitus (HCC)   Sepsis secondary to UTI (HCC)   Adjustment disorder with depressed mood   Sepsis (HCC)   Hypotensive episode   Bacteremia   Lactic acidosis   Antibiotics:  Vancomycin  8/10 Pip-tazo 8/10 Ceftriaxone  8/10 Azithromycin  8/10  Lines/Hardware: Left Fem CVC+, bilateral prosthetic knee   Assessment # Septic shock 2/2 - resolved, off vasopressors # Strep group C bacteremia - no concerns noted in bilateral prosthetic knee - 8/11 blood culture NG in 2 days - 8/12 TTE negative for vegetations or endocarditis, poor study   -  # ? Pneumonia in CXR -no respiratory symptoms documented, stable on 2 L nasal cannula  # Anal fissure/chronic wound rt leg-does not appear to be  infected based on pictures in media, wound care and d/w primary team  # AKI resolved  Recommendations - Will switch IV antibiotics to IV penicillin  only ( MIC<0.06) - fu repeat blood cultures  - Needs TEE due to TAVR to r/o endocarditis - Monitor CBC, CMP - Monitor for metastatic sites of infection including prosthetic knees - Fluid, electrolytes management per primary - Universal/standard isolation precaution D/w primary team  Rest of the management as per the primary team. Please call with questions or concerns.  Thank you for the consult  __________________________________________________________________________________________________________ HPI and Hospital Course: 78 year old male with multiple comorbidities as below including h/o rectal cancer with chronic fissure, DM, A-fib, CAD, HLD, HTN, COPD on 2 L nasal cannula severe aortic stenosis s/p TAVR, bilateral knee arthroplasty, chronic rt leg wound, strep dysgalactiae bacteremia in 2018, E coli bacteremia/UTI in May 2025 who presented to the ED on 8/10 for fever, weakness and confusion.   At ED febrile, tachycardic Labs remarkable for K5.3, AKI with creatinine 1.54, albumin 2.6, AST 63, ALT 47, lactic acid 5.0, WBC 15.3 UA cloudy, moderate leukocytes, negative nitrite protein 30, RBC 11-20 and WBC more than 50, Urine cx pending Blood cx 8/10 2/2 sets Streptococcus group C MRSA PCR positive Received IVF, broad-spectrum antibiotics  Chest x-ray  patchy retrocardiac airspace opacities likely atelectasis in the setting of low lung volumes, additionally a developing bronchopneumonia could have this appearance in the cardiac clinical sign text.   ID consulted for Streptococcus bacteremia  ROS: unable to recall symptoms PTA.  Denies any fever, chills, denies nausea, vomiting or abdominal pain or diarrhea.  Denies any chest pain cough or shortness of breath or GU symptoms.  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 Wonda and Dr. Lucas   Severe aortic stenosis    Sleep apnea    Urinary frequency    Urinary urgency    Wears glasses    Past Surgical History:  Procedure Laterality Date   ABDOMINAL AORTOGRAM W/LOWER EXTREMITY N/A 02/05/2023   Procedure: ABDOMINAL AORTOGRAM W/LOWER EXTREMITY;  Surgeon: Wendel Lurena POUR, MD;  [facility]: [facility] INVASIVE CV LAB;  Service: Cardiovascular;  Laterality: N/A;   CATARACT EXTRACTION W/PHACO Left 12/07/2021   Procedure: CATARACT EXTRACTION PHACO AND INTRAOCULAR LENS PLACEMENT (IOC);  Surgeon: Harrie Agent, MD;  Location: [facility] ORS;  Service: Ophthalmology;  Laterality: Left;  CDE 7.75   CATARACT EXTRACTION W/PHACO Right 12/21/2021   Procedure: CATARACT EXTRACTION PHACO AND INTRAOCULAR LENS PLACEMENT (IOC);  Surgeon: Harrie Agent, MD;  Location: [facility] ORS;  Service: Ophthalmology;  Laterality: Right;  CDE: 8.00   CHOLECYSTECTOMY     COLONOSCOPY  02/2012   Dr. Debrah: sigmoid, transverse, and ascending colon diverticulosis   COLONOSCOPY WITH PROPOFOL  N/A 01/13/2023   Procedure: COLONOSCOPY WITH PROPOFOL ;  Surgeon: Cindie Carlin POUR, DO;  Location: [facility] END[facility];  Service: Endoscopy;  Laterality: N/A;  1:00 pm, asa 3   FINGER SURGERY Right    INTRAOPERATIVE TRANSTHORACIC ECHOCARDIOGRAM N/A 02/25/2023   Procedure: INTRAOPERATIVE TRANSTHORACIC  ECHOCARDIOGRAM;  Surgeon: Wonda Sharper, MD;  Location: [facility] INVASIVE CV LAB;  Service: Open Heart Surgery;  Laterality: N/A;   prostate procedure  2017   per patient, was having trouble urinating    RIGHT HEART CATH AND CORONARY ANGIOGRAPHY N/A 02/05/2023   Procedure: RIGHT HEART CATH AND CORONARY ANGIOGRAPHY;  Surgeon: Wendel Lurena POUR, MD;  [facility]: [facility];  Service: Cardiovascular;  Laterality: N/A;   TONSILLECTOMY     TOTAL KNEE ARTHROPLASTY  2012   left (Dr. Dorian)   TOTAL KNEE ARTHROPLASTY Right 06/19/2015   Procedure: TOTAL KNEE ARTHROPLASTY;  Surgeon: Marcey Raman, MD;  Location: [facility] OR;  Service: Orthopedics;  Laterality: Right;   TRANSCATHETER AORTIC VALVE REPLACEMENT, TRANSFEMORAL N/A 02/25/2023   Procedure: Transcatheter Aortic Valve Replacement, Transfemoral;  Surgeon: Wonda Sharper, MD;  Location: [facility] INVASIVE CV LAB;  Service: Open Heart Surgery;  Laterality: N/A;    Scheduled Meds:  apixaban   5 mg Oral BID   Chlorhexidine   Gluconate Cloth  6 each Topical Q0600   clotrimazole -betamethasone   1 Application Topical BID   diltiazem   240 mg Oral Daily   feeding supplement  237 mL Oral BID BM   lactobacillus  1 g Oral TID WC   leptospermum manuka honey  1 Application Topical Daily   liver oil-zinc  oxide   Topical BID   melatonin  9 mg Oral QHS   metoprolol  tartrate  25 mg Oral BID   midodrine   5 mg Oral TID WC   mupirocin  ointment   Nasal BID   oxybutynin   5 mg Oral QPM   sodium chloride  flush  3 mL Intravenous Q12H   sodium chloride  flush  3 mL Intravenous Q12H   Continuous Infusions:  penicillin  G potassium 24 Million Units in dextrose  5 % 500 mL CONTINUOUS infusion     PRN Meds:.acetaminophen  **OR** acetaminophen , bisacodyl , HYDROmorphone  (DILAUDID ) injection, ipratropium, levalbuterol , metoprolol  tartrate, ondansetron  **OR** ondansetron  (ZOFRAN ) IV, mouth rinse, oxyCODONE , senna-docusate, traZODone   Allergies  Allergen Reactions   Diovan [Valsartan] Cough    Tagamet [Cimetidine] Other (See Comments)    Irritability    Social History   Socioeconomic History   Marital status: Divorced    Spouse name: deceased   Number of children: 0   Years of education: Not on file   Highest education level: 12th grade  Occupational History   Occupation: retired Lobbyist)    Employer: RETIRED  Tobacco Use   Smoking status: Former    Current packs/day: 0.00    Types: Cigarettes    Quit date: 01/18/2010    Years since quitting: 13.9   Smokeless tobacco: Never   Tobacco comments:    Smoked for 25 years- quit September 2011.  Passive exposure from wife  Vaping Use   Vaping status: Never Used  Substance and Sexual Activity   Alcohol use: Not Currently    Alcohol/week: 1.0 - 2.0 standard drink of alcohol    Types: 1 - 2 Glasses of wine per week   Drug use: No   Sexual activity: Not on file  Other Topics Concern   Not on file  Social History Narrative   Separated from wife (2014) and moved to [place]. Retired- worked in [employer]   09/26/22 divorced twice, his last wife died, no children, no living siblings, has a few cousins  in their 50s and over 29 years old.    has been using [employer] recently to get food.    Prior illness he had been independent with ADLs/IADLs, even driving.   Social Drivers of Corporate investment banker Strain: Low Risk  (09/26/2023)   Overall Financial Resource Strain (CARDIA)    Difficulty of Paying Living Expenses: Not hard at all  Food Insecurity: Patient Unable To Answer (12/28/2023)   Hunger Vital Sign    Worried About Running Out of Food in the Last Year: Patient unable to answer    Ran Out of Food in the Last Year: Patient unable to answer  Transportation Needs: Patient Unable To Answer (12/28/2023)   PRAPARE - Transportation    Lack of Transportation (Medical): Patient unable to answer    Lack of Transportation (Non-Medical): Patient unable to answer  Physical Activity: Inactive (09/26/2023)    Exercise Vital Sign    Days of Exercise per Week: 0 days    Minutes of Exercise per Session: 0 min  Stress: No Stress Concern Present (09/26/2023)   [employer] of Occupational Health - Occupational Stress Questionnaire  Feeling of Stress : Only a little  Social Connections: Patient Unable To Answer (12/28/2023)   Social Connection and Isolation Panel    Frequency of Communication with Friends and Family: Patient unable to answer    Frequency of Social Gatherings with Friends and Family: Patient unable to answer    Attends Religious Services: Patient unable to answer    Active Member of Clubs or Organizations: Patient unable to answer    Attends Banker Meetings: Patient unable to answer    Marital Status: Patient unable to answer  Intimate Partner Violence: Patient Unable To Answer (12/28/2023)   Humiliation, Afraid, Rape, and Kick questionnaire    Fear of Current or Ex-Partner: Patient unable to answer    Emotionally Abused: Patient unable to answer    Physically Abused: Patient unable to answer    Sexually Abused: Patient unable to answer   Family History  Problem Relation Age of Onset   Uterine cancer Mother    Cancer Mother        uterine   Coronary artery disease Father    Coronary artery disease Paternal Grandfather    Heart disease Paternal Grandfather    Colon cancer Neg Hx    Esophageal cancer Neg Hx    Stomach cancer Neg Hx    Rectal cancer Neg Hx    Liver disease Neg Hx     Vitals BP 121/81   Pulse (!) 126   Temp (!) 97.5 F (36.4 C) (Oral)   Resp (!) 21   Ht 6' 2 (1.88 m)   Wt 98.2 kg   SpO2 99%   BMI 27.80 kg/m    Physical Exam Virtual visit, Lying in the bed, comfortable Able to speak in full sentences   Pictures from 8/11 reviewed, does not appear to have active signs of infection in the left anterior leg and perirectal area           Pertinent Microbiology Results for orders placed or performed during the hospital  encounter of 12/28/23  Resp panel by RT-PCR (RSV, Flu A&B, Covid) Anterior Nasal Swab     Status: None   Collection Time: 12/28/23 11:52 AM   Specimen: Anterior Nasal Swab  Result Value Ref Range Status   SARS Coronavirus 2 by RT PCR NEGATIVE NEGATIVE Final    Comment: (NOTE) SARS-CoV-2 target nucleic acids are NOT DETECTED.  The SARS-CoV-2 RNA is generally detectable in upper respiratory specimens during the acute phase of infection. The lowest concentration of SARS-CoV-2 viral copies this assay can detect is 138 copies/mL. A negative result does not preclude SARS-Cov-2 infection and should not be used as the sole basis for treatment or other patient management decisions. A negative result may occur with  improper specimen collection/handling, submission of specimen other than nasopharyngeal swab, presence of viral mutation(s) within the areas targeted by this assay, and inadequate number of viral copies(<138 copies/mL). A negative result must be combined with clinical observations, patient history, and epidemiological information. The expected result is Negative.  Fact Sheet for Patients:  BloggerCourse.com  Fact Sheet for Healthcare Providers:  SeriousBroker.it  This test is no t yet approved or cleared by the [place]  FDA and  has been authorized for detection and/or diagnosis of SARS-CoV-2 by FDA under an Emergency Use Authorization (EUA). This EUA will remain  in effect (meaning this test can be used) for the duration of the COVID-19 declaration under Section 564(b)(1) of the Act, 21 U.S.C.section 360bbb-3(b)(1), unless the authorization is terminated  or revoked sooner.       Influenza A by PCR NEGATIVE NEGATIVE Final   Influenza B by PCR NEGATIVE NEGATIVE Final    Comment: (NOTE) The Xpert Xpress SARS-CoV-2/FLU/RSV plus assay is intended as an aid in the diagnosis of influenza from Nasopharyngeal swab specimens  and should not be used as a sole basis for treatment. Nasal washings and aspirates are unacceptable for Xpert Xpress SARS-CoV-2/FLU/RSV testing.  Fact Sheet for Patients: BloggerCourse.com  Fact Sheet for Healthcare Providers: SeriousBroker.it  This test is not yet approved or cleared by the [place]  FDA and has been authorized for detection and/or diagnosis of SARS-CoV-2 by FDA under an Emergency Use Authorization (EUA). This EUA will remain in effect (meaning this test can be used) for the duration of the COVID-19 declaration under Section 564(b)(1) of the Act, 21 U.S.C. section 360bbb-3(b)(1), unless the authorization is terminated or revoked.     Resp Syncytial Virus by PCR NEGATIVE NEGATIVE Final    Comment: (NOTE) Fact Sheet for Patients: BloggerCourse.com  Fact Sheet for Healthcare Providers: SeriousBroker.it  This test is not yet approved or cleared by the [place]  FDA and has been authorized for detection and/or diagnosis of SARS-CoV-2 by FDA under an Emergency Use Authorization (EUA). This EUA will remain in effect (meaning this test can be used) for the duration of the COVID-19 declaration under Section 564(b)(1) of the Act, 21 U.S.C. section 360bbb-3(b)(1), unless the authorization is terminated or revoked.  Performed at [facility], [address], [place], [place] [address]   Urine Culture     Status: Abnormal (Preliminary result)   Collection Time: 12/28/23 11:53 AM   Specimen: Urine, Random  Result Value Ref Range Status   Specimen Description   Final    URINE, RANDOM Performed at [facility], [address] [place], [place] [address]    Special Requests   Final    NONE Reflexed from [id number] Performed at [facility], [address], [place], [place] [address]    Culture 40,000 COLONIES/mL YEAST (A)  Final   Report Status PENDING  Incomplete   Blood Culture (routine x 2)     Status: Abnormal   Collection Time: 12/28/23 12:25 PM   Specimen: Right Antecubital; Blood  Result Value Ref Range Status   Specimen Description   Final    RIGHT ANTECUBITAL BOTTLES DRAWN AEROBIC AND ANAEROBIC Performed at [facility], [address] [place], [place] [address]    Special Requests   Final    Blood Culture adequate volume Performed at [facility], [address] [place], [place] [address]    Culture  Setup Time   Final    GRAM POSITIVE COCCI IN BOTH AEROBIC AND ANAEROBIC BOTTLES Gram Stain Report Called to,Read Back By and Verified With: C. KINDLEY ON 12/28/2023 @8 :20PM BY T.HAMER  Performed at [facility], [address], [place], [place] [address]    Culture (A)  Final    STREPTOCOCCUS GROUP C SUSCEPTIBILITIES PERFORMED ON PREVIOUS CULTURE WITHIN THE LAST 5 DAYS. Performed at [facility], [address], [place], [place] [address]    Report Status 12/30/2023 FINAL  Final  Blood Culture (routine x 2)     Status: Abnormal   Collection Time: 12/28/23 12:25 PM   Specimen: Left Antecubital; Blood  Result Value Ref Range Status   Specimen Description   Final    LEFT ANTECUBITAL BOTTLES DRAWN AEROBIC AND ANAEROBIC Performed at [facility], [address], [place], [place] [address]  Special Requests   Final    Blood Culture adequate volume Performed at [facility], [address] [place], [place] [address]    Culture  Setup Time   Final    GRAM POSITIVE COCCI Gram Stain Report Called to,Read Back By and Verified With: KINDLEY,C @ 2020 ON 12/28/23 BY [facility] IN BOTH AEROBIC AND ANAEROBIC BOTTLES GS DONE @ [facility] CRITICAL RESULT CALLED TO, READ BACK BY AND VERIFIED WITHBETHA JAYSON LINGER RN 12/29/2023 @ 0010 BY AB Performed at [facility], [address] [place], [place] [address]    Culture STREPTOCOCCUS GROUP C (A)  Final   Report Status 12/30/2023 FINAL  Final   Organism ID, Bacteria STREPTOCOCCUS GROUP C  Final       Susceptibility   Streptococcus group c - MIC*    CLINDAMYCIN RESISTANT Resistant     AMPICILLIN <=0.25 SENSITIVE Sensitive     ERYTHROMYCIN >=8 RESISTANT Resistant     VANCOMYCIN  0.5 SENSITIVE Sensitive     CEFTRIAXONE  <=0.12 SENSITIVE Sensitive     LEVOFLOXACIN 0.5 SENSITIVE Sensitive     PENICILLIN  <=0.06 SENSITIVE Sensitive     * STREPTOCOCCUS GROUP C  Blood Culture ID Panel (Reflexed)     Status: Abnormal   Collection Time: 12/28/23 12:25 PM  Result Value Ref Range Status   Enterococcus faecalis NOT DETECTED NOT DETECTED Final   Enterococcus Faecium NOT DETECTED NOT DETECTED Final   Listeria monocytogenes NOT DETECTED NOT DETECTED Final   Staphylococcus species NOT DETECTED NOT DETECTED Final   Staphylococcus aureus (BCID) NOT DETECTED NOT DETECTED Final   Staphylococcus epidermidis NOT DETECTED NOT DETECTED Final   Staphylococcus lugdunensis NOT DETECTED NOT DETECTED Final   Streptococcus species DETECTED (A) NOT DETECTED Final    Comment: Not Enterococcus species, Streptococcus agalactiae, Streptococcus pyogenes, or Streptococcus pneumoniae. CRITICAL RESULT CALLED TO, READ BACK BY AND VERIFIED WITH: C KINDLEY RN 12/29/2023 @ 0010 BY AB    Streptococcus agalactiae NOT DETECTED NOT DETECTED Final   Streptococcus pneumoniae NOT DETECTED NOT DETECTED Final   Streptococcus pyogenes NOT DETECTED NOT DETECTED Final   A.calcoaceticus-baumannii NOT DETECTED NOT DETECTED Final   Bacteroides fragilis NOT DETECTED NOT DETECTED Final   Enterobacterales NOT DETECTED NOT DETECTED Final   Enterobacter cloacae complex NOT DETECTED NOT DETECTED Final   Escherichia coli NOT DETECTED NOT DETECTED Final   Klebsiella aerogenes NOT DETECTED NOT DETECTED Final   Klebsiella oxytoca NOT DETECTED NOT DETECTED Final   Klebsiella pneumoniae NOT DETECTED NOT DETECTED Final   Proteus species NOT DETECTED NOT DETECTED Final   Salmonella species NOT DETECTED NOT DETECTED Final   Serratia marcescens NOT  DETECTED NOT DETECTED Final   Haemophilus influenzae NOT DETECTED NOT DETECTED Final   Neisseria meningitidis NOT DETECTED NOT DETECTED Final   Pseudomonas aeruginosa NOT DETECTED NOT DETECTED Final   Stenotrophomonas maltophilia NOT DETECTED NOT DETECTED Final   Candida albicans NOT DETECTED NOT DETECTED Final   Candida auris NOT DETECTED NOT DETECTED Final   Candida glabrata NOT DETECTED NOT DETECTED Final   Candida krusei NOT DETECTED NOT DETECTED Final   Candida parapsilosis NOT DETECTED NOT DETECTED Final   Candida tropicalis NOT DETECTED NOT DETECTED Final   Cryptococcus neoformans/gattii NOT DETECTED NOT DETECTED Final    Comment: Performed at [facility], [address] [place], [place] [address]  MRSA Next Gen by PCR, Nasal     Status: Abnormal   Collection Time: 12/28/23  2:30 PM   Specimen: Nasal Mucosa;  Nasal Swab  Result Value Ref Range Status   MRSA by PCR Next Gen DETECTED (A) NOT DETECTED Final    Comment: RESULT CALLED TO, READ BACK BY AND VERIFIED WITH: A SHELTON AT 1743 ON [id number] BY S DALTON (NOTE) The GeneXpert MRSA Assay (FDA approved for NASAL specimens only), is one component of a comprehensive MRSA colonization surveillance program. It is not intended to diagnose MRSA infection nor to guide or monitor treatment for MRSA infections. Test performance is not FDA approved in patients less than 70 years old. Performed at [facility], [address], [place], [place] [address]   Culture, blood (Routine X 2) w Reflex to ID Panel     Status: None (Preliminary result)   Collection Time: 12/29/23 11:31 PM   Specimen: BLOOD  Result Value Ref Range Status   Specimen Description BLOOD LEFT ANTECUBITAL  Final   Special Requests   Final    BOTTLES DRAWN AEROBIC AND ANAEROBIC Blood Culture adequate volume   Culture   Final    NO GROWTH 2 DAYS Performed at [facility], [address], [place], [place] [address]    Report Status PENDING  Incomplete  Culture,  blood (Routine X 2) w Reflex to ID Panel     Status: None (Preliminary result)   Collection Time: 12/29/23 11:32 PM   Specimen: BLOOD LEFT HAND  Result Value Ref Range Status   Specimen Description BLOOD LEFT HAND  Final   Special Requests   Final    AEROBIC BOTTLE ONLY Blood Culture results may not be optimal due to an inadequate volume of blood received in culture bottles   Culture   Final    NO GROWTH 2 DAYS Performed at [facility], [address] [place], [place] [address]    Report Status PENDING  Incomplete   Pertinent Lab seen by me:    Latest Ref Rng & Units 12/31/2023    2:28 AM 12/30/2023    5:05 AM 12/29/2023    4:33 AM  CBC  WBC 4.0 - 10.5 K/uL 6.3  7.4  17.7   Hemoglobin 13.0 - 17.0 g/dL 8.6  8.7  9.3   Hematocrit 39.0 - 52.0 % 27.2  27.7  30.6   Platelets 150 - 400 K/uL 77  85  110       Latest Ref Rng & Units 12/31/2023    2:28 AM 12/30/2023    5:05 AM 12/29/2023    4:33 AM  CMP  Glucose 70 - 99 mg/dL 97  84  889   BUN 8 - 23 mg/dL 16  19  25    Creatinine 0.61 - 1.24 mg/dL 9.50  9.42  9.26   Sodium 135 - 145 mmol/L 138  136  136   Potassium 3.5 - 5.1 mmol/L 3.3  3.5  4.0   Chloride 98 - 111 mmol/L 109  106  109   CO2 22 - 32 mmol/L 23  23  20    Calcium  8.9 - 10.3 mg/dL 7.9  8.1  7.8   Total Protein 6.5 - 8.1 g/dL 4.9  4.7  5.0   Total Bilirubin 0.0 - 1.2 mg/dL 0.5  0.5  0.8   Alkaline Phos 38 - 126 U/L 86  82  87   AST 15 - 41 U/L 67  49  44   ALT 0 - 44 U/L 40  31  33     Pertinent Imagings/Other Imagings Plain films and CT images have been personally visualized and  interpreted; radiology reports have been reviewed. Decision making incorporated into the Impression / Recommendations.  ECHOCARDIOGRAM COMPLETE Result Date: 12/30/2023    ECHOCARDIOGRAM REPORT   Patient Name:   RIDGELY ANASTACIO Date of Exam: 12/30/2023 Medical Rec #:  [id number]          Height:       74.0 in Accession #:    [id number]         Weight:       205.5 lb Date of Birth:  04-Mar-1946          BSA:          2.199 m Patient Age:    77 years           BP:           122/82 mmHg Patient Gender: M                  HR:           123 bpm. Exam Location:  [facility] Procedure: 2D Echo, Cardiac Doppler, Color Doppler and Intracardiac            Opacification Agent (Both Spectral and Color Flow Doppler were            utilized during procedure). Indications:    Bacteremia R78.81  History:        Patient has prior history of Echocardiogram examinations, most                 recent 02/26/2023. CAD, Aortic Valve Disease, Arrythmias:Atrial                 Fibrillation; Risk Factors:Hypertension, Diabetes and Sleep                 Apnea.                 Aortic Valve: 26 mm Edwards Sapien prosthetic, stented (TAVR)                 valve is present in the aortic position. Procedure Date:                 02/25/23.  Sonographer:    Jayson Gaskins Referring Phys: [id number] DORN FALCON BRANCH  Sonographer Comments: Technically difficult study due to poor echo windows. IMPRESSIONS  1. Left ventricular ejection fraction, by estimation, is >75%. The left ventricle has hyperdynamic function. The left ventricle has no regional wall motion abnormalities. Left ventricular diastolic parameters are indeterminate.  2. RV not well visualized, grossly appears normal in size and function. . Right ventricular systolic function was not well visualized. The right ventricular size is not well visualized.  3. The mitral valve was not well visualized. No evidence of mitral valve regurgitation. No evidence of mitral stenosis.  4. The aortic valve was not well visualized. Aortic valve regurgitation is not visualized. No aortic stenosis is present. 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 Procedure Date: 02/25/23.  5. IVC is small suggesting low RA pressure and hypovolemia. FINDINGS  Left Ventricle: Left ventricular ejection fraction, by estimation, is >75%. The left ventricle has hyperdynamic  function. The left ventricle has no regional wall motion abnormalities. Definity  contrast agent was given IV to delineate the left ventricular endocardial borders. The left ventricular internal cavity size was normal in size. There is no left ventricular hypertrophy. Left ventricular diastolic parameters are indeterminate. Right Ventricle: RV not well visualized, grossly appears normal  in size and function. The right ventricular size is not well visualized. Right vetricular wall thickness was not well visualized. Right ventricular systolic function was not well visualized. Left Atrium: Left atrial size was normal in size. Right Atrium: Right atrial size was not well visualized. Pericardium: There is no evidence of pericardial effusion. Mitral Valve: The mitral valve was not well visualized. No evidence of mitral valve regurgitation. No evidence of mitral valve stenosis. Tricuspid Valve: The tricuspid valve is not well visualized. Tricuspid valve regurgitation is trivial. No evidence of tricuspid stenosis. Aortic Valve: The aortic valve was not well visualized. Aortic valve regurgitation is not visualized. No aortic stenosis is present. Aortic valve mean gradient measures 6.0 mmHg. Aortic valve peak gradient measures 11.0 mmHg. Aortic valve area, by VTI measures 2.33 cm. 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 Procedure Date: 02/25/23. Pulmonic Valve: The pulmonic valve was not well visualized. Pulmonic valve regurgitation is not visualized. No evidence of pulmonic stenosis. Aorta: The aortic root was not well visualized. Venous: IVC is small suggesting low RA pressure and hypovolemia. IAS/Shunts: No atrial level shunt detected by color flow Doppler.  LEFT VENTRICLE PLAX 2D LVIDd:         3.60 cm LVIDs:         2.30 cm LV PW:         1.00 cm LV IVS:        1.05 cm LVOT diam:     2.00 cm LV SV:         62 LV SV Index:   28 LVOT Area:     3.14 cm  RIGHT  VENTRICLE RV S prime:     16.40 cm/s TAPSE (M-mode): 2.4 cm LEFT ATRIUM           Index LA Vol (A4C): 40.2 ml 18.28 ml/m  AORTIC VALVE AV Area (Vmax):    2.32 cm AV Area (Vmean):   2.10 cm AV Area (VTI):     2.33 cm AV Vmax:           165.50 cm/s AV Vmean:          118.000 cm/s AV VTI:            0.267 m AV Peak Grad:      11.0 mmHg AV Mean Grad:      6.0 mmHg LVOT Vmax:         122.00 cm/s LVOT Vmean:        78.700 cm/s LVOT VTI:          0.198 m LVOT/AV VTI ratio: 0.74 MITRAL VALVE MV Area (PHT): 6.32 cm    SHUNTS MV Decel Time: 120 msec    Systemic VTI:  0.20 m MV E velocity: 96.10 cm/s  Systemic Diam: 2.00 cm Dorn Ross MD Electronically signed by Dorn Ross MD Signature Date/Time: 12/30/2023/10:24:20 AM    Final    DG Chest Port 1 View Result Date: 12/28/2023 CLINICAL DATA:  Questionable sepsis - evaluate for abnormality EXAM: PORTABLE CHEST - 1 VIEW COMPARISON:  Sep 22, 2023 FINDINGS: Low lung volumes. Patchy retrocardiac airspace opacities. No pleural effusion or pneumothorax. Moderate cardiomegaly. 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 No acute fracture or destructive lesions. Multilevel thoracic osteophytosis. Osteopenia. IMPRESSION: Patchy retrocardiac airspace opacities, likely atelectasis in the setting of low lung volumes. Alternatively, a developing bronchopneumonia could have this appearance in the correct clinical context. Electronically Signed   By:  Rogelia Myers M.D.   On: 12/28/2023 12:41   I discussed the limitations of evaluation and management by telemedicine. The patient expressed understanding and agreed to proceed.   I discussed the assessment and treatment plan with the patient. The patient was provided an opportunity to ask questions and all were answered. The patient agreed with the plan and demonstrated an understanding of the instructions.   The patient was advised to call back or seek an in-person evaluation if the  symptoms worsen or if the condition fails to improve as anticipated.  I spent 80 minutes involved in face-to-face and non-face-to-face activities for this patient on the day of the visit. Professional time spent includes the following activities: Preparing to see the patient (review of tests), Obtaining and reviewing separately obtained history (EDP, H&P), Performing a medically appropriate evaluation, Ordering medications, referring and communicating with other health care professionals, Documenting clinical information in the EMR, Independently interpreting results (not separately reported) and Care coordination (not separately reported).  Electronically signed by:   Plan d/w requesting provider as well as ID pharm D  Of note, portions of this note may have been created with voice recognition software. While this note has been edited for accuracy, occasional wrong-word or 'sound-a-like' substitutions may have occurred due to the inherent limitations of voice recognition software.   Annalee Orem, MD Infectious Disease Physician [facility] Pager: [phone]

## 2024-01-01 DIAGNOSIS — R6520 Severe sepsis without septic shock: Secondary | ICD-10-CM | POA: Diagnosis not present

## 2024-01-01 DIAGNOSIS — R6521 Severe sepsis with septic shock: Secondary | ICD-10-CM | POA: Diagnosis not present

## 2024-01-01 DIAGNOSIS — R7401 Elevation of levels of liver transaminase levels: Secondary | ICD-10-CM | POA: Diagnosis not present

## 2024-01-01 DIAGNOSIS — N390 Urinary tract infection, site not specified: Secondary | ICD-10-CM | POA: Diagnosis not present

## 2024-01-01 LAB — BASIC METABOLIC PANEL WITH GFR
Anion gap: 5 (ref 5–15)
BUN: 13 mg/dL (ref 8–23)
CO2: 23 mmol/L (ref 22–32)
Calcium: 7.6 mg/dL — ABNORMAL LOW (ref 8.9–10.3)
Chloride: 108 mmol/L (ref 98–111)
Creatinine, Ser: 0.48 mg/dL — ABNORMAL LOW (ref 0.61–1.24)
GFR, Estimated: >60 mL/min (ref >60)
Glucose, Bld: 96 mg/dL (ref 70–99)
Potassium: 3.6 mmol/L (ref 3.5–5.1)
Sodium: 136 mmol/L (ref 135–145)

## 2024-01-01 LAB — GLUCOSE, CAPILLARY: Glucose-Capillary: 92 mg/dL (ref 70–99)

## 2024-01-01 LAB — CBC
HCT: 26.7 % — ABNORMAL LOW (ref 39.0–52.0)
Hemoglobin: 8.4 g/dL — ABNORMAL LOW (ref 13.0–17.0)
MCH: 29.8 pg (ref 26.0–34.0)
MCHC: 31.5 g/dL (ref 30.0–36.0)
MCV: 94.7 fL (ref 80.0–100.0)
Platelets: 80 K/uL — ABNORMAL LOW (ref 150–400)
RBC: 2.82 MIL/uL — ABNORMAL LOW (ref 4.22–5.81)
RDW: 16.2 % — ABNORMAL HIGH (ref 11.5–15.5)
WBC: 6.2 K/uL (ref 4.0–10.5)
nRBC: 0.3 % — ABNORMAL HIGH (ref 0.0–0.2)

## 2024-01-01 LAB — MAGNESIUM: Magnesium: 1.6 mg/dL — ABNORMAL LOW (ref 1.7–2.4)

## 2024-01-01 MED ORDER — HYDROMORPHONE HCL 1 MG/ML IJ SOLN: 0.5000 mg | INTRAMUSCULAR | Status: DC | PRN

## 2024-01-01 MED ORDER — DUTASTERIDE 0.5 MG PO CAPS
0.5000 mg | ORAL_CAPSULE | Freq: Every day | ORAL | Status: DC
  Administered 2024-01-01 – 2024-01-06 (×6): 0.5 mg via ORAL
  Filled 2024-01-01 (×8): qty 1

## 2024-01-01 MED ORDER — MAGNESIUM SULFATE 4 GM/100ML IV SOLN
4.0000 g | Freq: Once | INTRAVENOUS | Status: AC
  Administered 2024-01-01: 4 g via INTRAVENOUS
  Filled 2024-01-01: qty 100

## 2024-01-01 NOTE — Plan of Care (Signed)
  Problem: Acute Rehab PT Goals(only PT should resolve) Goal: Pt Will Go Supine/Side To Sit Outcome: Progressing Flowsheets (Taken 01/01/2024 1229) Pt will go Supine/Side to Sit: with moderate assist Goal: Patient Will Transfer Sit To/From Stand Outcome: Progressing Flowsheets (Taken 01/01/2024 1229) Patient will transfer sit to/from stand: with moderate assist Goal: Pt Will Transfer Bed To Chair/Chair To Bed Outcome: Progressing Flowsheets (Taken 01/01/2024 1229) Pt will Transfer Bed to Chair/Chair to Bed:  with mod assist  with max assist Goal: Pt Will Ambulate Outcome: Progressing Flowsheets (Taken 01/01/2024 1229) Pt will Ambulate:  10 feet  with moderate assist  with maximum assist  with rolling walker   12:30 PM, 01/01/24 Lynwood Music, MPT Physical Therapist with [facility] [phone] office [phone] mobile phone

## 2024-01-01 NOTE — Progress Notes (Signed)
 ID brief note ( remote fu)  Afebrile    Latest Ref Rng & Units 01/01/2024    4:55 AM 12/31/2023    2:28 AM 12/30/2023    5:05 AM  CBC  WBC 4.0 - 10.5 K/uL 6.2  6.3  7.4   Hemoglobin 13.0 - 17.0 g/dL 8.4  8.6  8.7   Hematocrit 39.0 - 52.0 % 26.7  27.2  27.7   Platelets 150 - 400 K/uL 80  77  85       Latest Ref Rng & Units 01/01/2024    4:55 AM 12/31/2023    2:28 AM 12/30/2023    5:05 AM  CMP  Glucose 70 - 99 mg/dL 96  97  84   BUN 8 - 23 mg/dL 13  16  19    Creatinine 0.61 - 1.24 mg/dL 9.51  9.50  9.42   Sodium 135 - 145 mmol/L 136  138  136   Potassium 3.5 - 5.1 mmol/L 3.6  3.3  3.5   Chloride 98 - 111 mmol/L 108  109  106   CO2 22 - 32 mmol/L 23  23  23    Calcium  8.9 - 10.3 mg/dL 7.6  7.9  8.1   Total Protein 6.5 - 8.1 g/dL  4.9  4.7   Total Bilirubin 0.0 - 1.2 mg/dL  0.5  0.5   Alkaline Phos 38 - 126 U/L  86  82   AST 15 - 41 U/L  67  49   ALT 0 - 44 U/L  40  31    Results for orders placed or performed during the hospital encounter of 12/28/23  Resp panel by RT-PCR (RSV, Flu A&B, Covid) Anterior Nasal Swab     Status: None   Collection Time: 12/28/23 11:52 AM   Specimen: Anterior Nasal Swab  Result Value Ref Range Status   SARS Coronavirus 2 by RT PCR NEGATIVE NEGATIVE Final    Comment: (NOTE) SARS-CoV-2 target nucleic acids are NOT DETECTED.  The SARS-CoV-2 RNA is generally detectable in upper respiratory specimens during the acute phase of infection. The lowest concentration of SARS-CoV-2 viral copies this assay can detect is 138 copies/mL. A negative result does not preclude SARS-Cov-2 infection and should not be used as the sole basis for treatment or other patient management decisions. A negative result may occur with  improper specimen collection/handling, submission of specimen other than nasopharyngeal swab, presence of viral mutation(s) within the areas targeted by this assay, and inadequate number of viral copies(<138 copies/mL). A negative result must be  combined with clinical observations, patient history, and epidemiological information. The expected result is Negative.  Fact Sheet for Patients:  BloggerCourse.com  Fact Sheet for Healthcare Providers:  SeriousBroker.it  This test is no t yet approved or cleared by the [place]  FDA and  has been authorized for detection and/or diagnosis of SARS-CoV-2 by FDA under an Emergency Use Authorization (EUA). This EUA will remain  in effect (meaning this test can be used) for the duration of the COVID-19 declaration under Section 564(b)(1) of the Act, 21 U.S.C.section 360bbb-3(b)(1), unless the authorization is terminated  or revoked sooner.       Influenza A by PCR NEGATIVE NEGATIVE Final   Influenza B by PCR NEGATIVE NEGATIVE Final    Comment: (NOTE) The Xpert Xpress SARS-CoV-2/FLU/RSV plus assay is intended as an aid in the diagnosis of influenza from Nasopharyngeal swab specimens and should not be used as a sole basis for treatment. Nasal washings and  aspirates are unacceptable for Xpert Xpress SARS-CoV-2/FLU/RSV testing.  Fact Sheet for Patients: BloggerCourse.com  Fact Sheet for Healthcare Providers: SeriousBroker.it  This test is not yet approved or cleared by the [place]  FDA and has been authorized for detection and/or diagnosis of SARS-CoV-2 by FDA under an Emergency Use Authorization (EUA). This EUA will remain in effect (meaning this test can be used) for the duration of the COVID-19 declaration under Section 564(b)(1) of the Act, 21 U.S.C. section 360bbb-3(b)(1), unless the authorization is terminated or revoked.     Resp Syncytial Virus by PCR NEGATIVE NEGATIVE Final    Comment: (NOTE) Fact Sheet for Patients: BloggerCourse.com  Fact Sheet for Healthcare Providers: SeriousBroker.it  This test is not yet  approved or cleared by the [place]  FDA and has been authorized for detection and/or diagnosis of SARS-CoV-2 by FDA under an Emergency Use Authorization (EUA). This EUA will remain in effect (meaning this test can be used) for the duration of the COVID-19 declaration under Section 564(b)(1) of the Act, 21 U.S.C. section 360bbb-3(b)(1), unless the authorization is terminated or revoked.  Performed at [facility], [address], [place], [place] [address]   Urine Culture     Status: Abnormal   Collection Time: 12/28/23 11:53 AM   Specimen: Urine, Random  Result Value Ref Range Status   Specimen Description   Final    URINE, RANDOM Performed at [facility], [address] [place], [place] [address]    Special Requests   Final    NONE Reflexed from [id number] Performed at [facility], [address], [place], [place] [address]    Culture 40,000 COLONIES/mL YEAST (A)  Final   Report Status 12/31/2023 FINAL  Final  Blood Culture (routine x 2)     Status: Abnormal   Collection Time: 12/28/23 12:25 PM   Specimen: Right Antecubital; Blood  Result Value Ref Range Status   Specimen Description   Final    RIGHT ANTECUBITAL BOTTLES DRAWN AEROBIC AND ANAEROBIC Performed at [facility], [address] [place], [place] [address]    Special Requests   Final    Blood Culture adequate volume Performed at [facility], [address] [place], [place] [address]    Culture  Setup Time   Final    GRAM POSITIVE COCCI IN BOTH AEROBIC AND ANAEROBIC BOTTLES Gram Stain Report Called to,Read Back By and Verified With: C. KINDLEY ON 12/28/2023 @8 :20PM BY T.HAMER  Performed at [facility], [address], [place], [place] [address]    Culture (A)  Final    STREPTOCOCCUS GROUP C SUSCEPTIBILITIES PERFORMED ON PREVIOUS CULTURE WITHIN THE LAST 5 DAYS. Performed at [facility], [address] [place], [place] [address]    Report Status 12/30/2023 FINAL  Final  Blood Culture (routine x 2)      Status: Abnormal   Collection Time: 12/28/23 12:25 PM   Specimen: Left Antecubital; Blood  Result Value Ref Range Status   Specimen Description   Final    LEFT ANTECUBITAL BOTTLES DRAWN AEROBIC AND ANAEROBIC Performed at [facility], [address], [place], [place] [address]    Special Requests   Final    Blood Culture adequate volume Performed at [facility], [address] [place], [place] [address]    Culture  Setup Time   Final    GRAM POSITIVE COCCI Gram Stain Report Called to,Read Back By and Verified With: KINDLEY,C @ 2020 ON 12/28/23 BY [facility] IN BOTH AEROBIC AND ANAEROBIC BOTTLES  GS DONE @ [facility] CRITICAL RESULT CALLED TO, READ BACK BY AND VERIFIED WITHBETHA JAYSON LINGER RN 12/29/2023 @ 0010 BY AB Performed at [facility], [address], [place], [place] [address]    Culture STREPTOCOCCUS GROUP C (A)  Final   Report Status 12/30/2023 FINAL  Final   Organism ID, Bacteria STREPTOCOCCUS GROUP C  Final      Susceptibility   Streptococcus group c - MIC*    CLINDAMYCIN RESISTANT Resistant     AMPICILLIN <=0.25 SENSITIVE Sensitive     ERYTHROMYCIN >=8 RESISTANT Resistant     VANCOMYCIN  0.5 SENSITIVE Sensitive     CEFTRIAXONE  <=0.12 SENSITIVE Sensitive     LEVOFLOXACIN 0.5 SENSITIVE Sensitive     PENICILLIN  <=0.06 SENSITIVE Sensitive     * STREPTOCOCCUS GROUP C  Blood Culture ID Panel (Reflexed)     Status: Abnormal   Collection Time: 12/28/23 12:25 PM  Result Value Ref Range Status   Enterococcus faecalis NOT DETECTED NOT DETECTED Final   Enterococcus Faecium NOT DETECTED NOT DETECTED Final   Listeria monocytogenes NOT DETECTED NOT DETECTED Final   Staphylococcus species NOT DETECTED NOT DETECTED Final   Staphylococcus aureus (BCID) NOT DETECTED NOT DETECTED Final   Staphylococcus epidermidis NOT DETECTED NOT DETECTED Final   Staphylococcus lugdunensis NOT DETECTED NOT DETECTED Final   Streptococcus species DETECTED (A) NOT DETECTED Final    Comment: Not Enterococcus  species, Streptococcus agalactiae, Streptococcus pyogenes, or Streptococcus pneumoniae. CRITICAL RESULT CALLED TO, READ BACK BY AND VERIFIED WITH: C KINDLEY RN 12/29/2023 @ 0010 BY AB    Streptococcus agalactiae NOT DETECTED NOT DETECTED Final   Streptococcus pneumoniae NOT DETECTED NOT DETECTED Final   Streptococcus pyogenes NOT DETECTED NOT DETECTED Final   A.calcoaceticus-baumannii NOT DETECTED NOT DETECTED Final   Bacteroides fragilis NOT DETECTED NOT DETECTED Final   Enterobacterales NOT DETECTED NOT DETECTED Final   Enterobacter cloacae complex NOT DETECTED NOT DETECTED Final   Escherichia coli NOT DETECTED NOT DETECTED Final   Klebsiella aerogenes NOT DETECTED NOT DETECTED Final   Klebsiella oxytoca NOT DETECTED NOT DETECTED Final   Klebsiella pneumoniae NOT DETECTED NOT DETECTED Final   Proteus species NOT DETECTED NOT DETECTED Final   Salmonella species NOT DETECTED NOT DETECTED Final   Serratia marcescens NOT DETECTED NOT DETECTED Final   Haemophilus influenzae NOT DETECTED NOT DETECTED Final   Neisseria meningitidis NOT DETECTED NOT DETECTED Final   Pseudomonas aeruginosa NOT DETECTED NOT DETECTED Final   Stenotrophomonas maltophilia NOT DETECTED NOT DETECTED Final   Candida albicans NOT DETECTED NOT DETECTED Final   Candida auris NOT DETECTED NOT DETECTED Final   Candida glabrata NOT DETECTED NOT DETECTED Final   Candida krusei NOT DETECTED NOT DETECTED Final   Candida parapsilosis NOT DETECTED NOT DETECTED Final   Candida tropicalis NOT DETECTED NOT DETECTED Final   Cryptococcus neoformans/gattii NOT DETECTED NOT DETECTED Final    Comment: Performed at [facility], [address], [place], [place] [address]  MRSA Next Gen by PCR, Nasal     Status: Abnormal   Collection Time: 12/28/23  2:30 PM   Specimen: Nasal Mucosa; Nasal Swab  Result Value Ref Range Status   MRSA by PCR Next Gen DETECTED (A) NOT DETECTED Final    Comment: RESULT CALLED TO, READ BACK BY AND  VERIFIED WITH: A SHELTON AT 1743 ON [id number] BY S DALTON (NOTE) The GeneXpert MRSA Assay (FDA approved for NASAL specimens only), is one component of a comprehensive MRSA colonization surveillance  program. It is not intended to diagnose MRSA infection nor to guide or monitor treatment for MRSA infections. Test performance is not FDA approved in patients less than 30 years old. Performed at [facility], [address], [place], [place] [address]   Culture, blood (Routine X 2) w Reflex to ID Panel     Status: None (Preliminary result)   Collection Time: 12/29/23 11:31 PM   Specimen: BLOOD  Result Value Ref Range Status   Specimen Description BLOOD LEFT ANTECUBITAL  Final   Special Requests   Final    BOTTLES DRAWN AEROBIC AND ANAEROBIC Blood Culture adequate volume   Culture   Final    NO GROWTH 3 DAYS Performed at [facility], [address] [place], [place] [address]    Report Status PENDING  Incomplete  Culture, blood (Routine X 2) w Reflex to ID Panel     Status: None (Preliminary result)   Collection Time: 12/29/23 11:32 PM   Specimen: BLOOD LEFT HAND  Result Value Ref Range Status   Specimen Description BLOOD LEFT HAND  Final   Special Requests   Final    AEROBIC BOTTLE ONLY Blood Culture results may not be optimal due to an inadequate volume of blood received in culture bottles   Culture   Final    NO GROWTH 3 DAYS Performed at [facility], [address] [place], [place] [address]    Report Status PENDING  Incomplete   8/11 blood cx NG in 3 days   Remains on IV pen G  Planned for TEE early next week  Monitor CBC and BMP on IV antibiotics  ID will NOT actively follow pending TEE early next week. Please reach out if active questions or concerns  Annalee Orem, MD Infectious Disease Physician [facility] [address] [place], [place] [address] Phone: [phone]  Fax: [phone]

## 2024-01-01 NOTE — Evaluation (Signed)
 Physical Therapy Evaluation Patient Details Name: Gary Carroll MRN: [id number] DOB: 1946/02/20 Today's Date: 01/01/2024  History of Present Illness  Gary Carroll is a 78 year old with extensive history of severe aortic stenosis s/p TAVR, paroxysmal A-fib on Eliquis , CAD, rectal adenocarcinoma, HTN, HLD, DM2, chronic heart failure HFpEF, presented to ED from nursing home with chief complaint of generalized weaknesses, fever, confusion.     Patient is poor historian at this point-electronic record review reveals patient has had multiple hospitalization for multiple comorbidities last discharged on 09/30/2023 after treatment for sepsis, UTI secondary to E. coli, Enterobacter, E. coli bacteremia COVID infection in April, E. coli sepsis.   Clinical Impression  Patient demonstrates slow labored movement for sitting up at bedside requiring use of bed rail and Max assist for sitting up, on seated unable to maintain sitting balance due to constant posterior leaning and generalized weakness. Patient unable to attempt sit to stands and required Max assist for repositioning when put back to bed. Patient will benefit from continued skilled physical therapy in hospital and recommended venue below to increase strength, balance, endurance for safe ADLs and gait.          If plan is discharge home, recommend the following: A lot of help with bathing/dressing/bathroom;A lot of help with walking and/or transfers;Help with stairs or ramp for entrance;Assistance with cooking/housework   Can travel by private vehicle   No    Equipment Recommendations None recommended by PT  Recommendations for Other Services       Functional Status Assessment Patient has had a recent decline in their functional status and demonstrates the ability to make significant improvements in function in a reasonable and predictable amount of time.     Precautions / Restrictions Precautions Precautions: Fall Recall of  Precautions/Restrictions: Intact Restrictions Weight Bearing Restrictions Per Provider Order: No      Mobility  Bed Mobility Overal bed mobility: Needs Assistance Bed Mobility: Supine to Sit, Sit to Supine     Supine to sit: Max assist, HOB elevated Sit to supine: Max assist   General bed mobility comments: unsteady labored movement with constant posterior leaning with poor sitting balance    Transfers                        Ambulation/Gait                  Stairs            Wheelchair Mobility     Tilt Bed    Modified Rankin (Stroke Patients Only)       Balance Overall balance assessment: Needs assistance Sitting-balance support: Feet supported, Bilateral upper extremity supported Sitting balance-Leahy Scale: Poor Sitting balance - Comments: seated at EOB Postural control: Posterior lean                                   Pertinent Vitals/Pain Pain Assessment Pain Assessment: Faces Faces Pain Scale: Hurts even more Pain Location: back Pain Descriptors / Indicators: Grimacing, Guarding Pain Intervention(s): Limited activity within patient's tolerance, Monitored during session, Repositioned    Home Living Family/patient expects to be discharged to:: Private residence Living Arrangements: Alone Available Help at Discharge: Friend(s);Available PRN/intermittently Type of Home: Apartment Home Access: Stairs to enter Entrance Stairs-Rails: Right;Left Entrance Stairs-Number of Steps: 4   Home Layout: One level Home Equipment: Agricultural consultant (2 wheels);BSC/3in1;Shower seat  Prior Function Prior Level of Function : Needs assist       Physical Assist : Mobility (physical);ADLs (physical) Mobility (physical): Bed mobility;Transfers;Gait;Stairs ADLs (physical): Grooming;Bathing;Dressing;Toileting;IADLs Mobility Comments: Household ambulation using RW, nonambulatory since at SNF ADLs Comments: Reports as previously  (I) but assisted at SNF for all ADL's but feeding.     Extremity/Trunk Assessment   Upper Extremity Assessment Upper Extremity Assessment: Defer to OT evaluation    Lower Extremity Assessment Lower Extremity Assessment: Generalized weakness    Cervical / Trunk Assessment Cervical / Trunk Assessment: Normal  Communication   Communication Communication: No apparent difficulties    Cognition Arousal: Alert Behavior During Therapy: WFL for tasks assessed/performed   PT - Cognitive impairments: No family/caregiver present to determine baseline                         Following commands: Intact       Cueing Cueing Techniques: Verbal cues, Tactile cues     General Comments      Exercises     Assessment/Plan    PT Assessment Patient needs continued PT services  PT Problem List Decreased strength;Decreased activity tolerance;Decreased balance;Decreased mobility       PT Treatment Interventions DME instruction;Gait training;Functional mobility training;Therapeutic activities;Therapeutic exercise;Balance training;Patient/family education    PT Goals (Current goals can be found in the Care Plan section)  Acute Rehab PT Goals Patient Stated Goal: return home PT Goal Formulation: With patient Time For Goal Achievement: 01/15/24 Potential to Achieve Goals: Fair    Frequency Min 3X/week     Co-evaluation PT/OT/SLP Co-Evaluation/Treatment: Yes Reason for Co-Treatment: Complexity of the patient's impairments (multi-system involvement) PT goals addressed during session: Mobility/safety with mobility;Balance OT goals addressed during session: ADL's and self-care       AM-PAC PT 6 Clicks Mobility  Outcome Measure Help needed turning from your back to your side while in a flat bed without using bedrails?: A Lot Help needed moving from lying on your back to sitting on the side of a flat bed without using bedrails?: A Lot Help needed moving to and from a bed  to a chair (including a wheelchair)?: Total Help needed standing up from a chair using your arms (e.g., wheelchair or bedside chair)?: Total Help needed to walk in hospital room?: Total Help needed climbing 3-5 steps with a railing? : Total 6 Click Score: 8    End of Session   Activity Tolerance: Patient tolerated treatment well;Patient limited by fatigue Patient left: in bed;with call bell/phone within reach Nurse Communication: Mobility status PT Visit Diagnosis: Unsteadiness on feet (R26.81);Other abnormalities of gait and mobility (R26.89);Muscle weakness (generalized) (M62.81)    Time: 9052-8984 PT Time Calculation (min) (ACUTE ONLY): 28 min   Charges:   PT Evaluation $PT Eval Moderate Complexity: 1 Mod PT Treatments $Therapeutic Activity: 23-37 mins PT General Charges $$ ACUTE PT VISIT: 1 Visit         12:28 PM, 01/01/24 Lynwood Music, MPT Physical Therapist with [facility] [phone] office [phone] mobile phone

## 2024-01-01 NOTE — Plan of Care (Signed)
  Problem: Acute Rehab OT Goals (only OT should resolve) Goal: Pt. Will Perform Grooming Flowsheets (Taken 01/01/2024 1206) Pt Will Perform Grooming:  with set-up  sitting Goal: Pt. Will Perform Upper Body Bathing Flowsheets (Taken 01/01/2024 1206) Pt Will Perform Upper Body Bathing:  with contact guard assist  sitting Goal: Pt. Will Perform Upper Body Dressing Flowsheets (Taken 01/01/2024 1206) Pt Will Perform Upper Body Dressing:  with contact guard assist  sitting Goal: Pt. Will Perform Lower Body Dressing Flowsheets (Taken 01/01/2024 1206) Pt Will Perform Lower Body Dressing:  with min assist  with mod assist  with adaptive equipment  sitting/lateral leans  bed level Goal: Pt. Will Transfer To Toilet Flowsheets (Taken 01/01/2024 1206) Pt Will Transfer to Toilet:  with mod assist  stand pivot transfer Goal: Pt. Will Perform Toileting-Clothing Manipulation Flowsheets (Taken 01/01/2024 1206) Pt Will Perform Toileting - Clothing Manipulation and hygiene:  with mod assist  sitting/lateral leans  with min assist Goal: Pt/Caregiver Will Perform Home Exercise Program Flowsheets (Taken 01/01/2024 1206) Pt/caregiver will Perform Home Exercise Program:  Increased ROM  Increased strength  Both right and left upper extremity  Independently  Brittane Grudzinski OT, MOT

## 2024-01-01 NOTE — Progress Notes (Signed)
 PROGRESS NOTE   Gary Carroll  FMW:[id number] DOB: 10/22/1945 DOA: 12/28/2023 PCP: [facility]   Chief Complaint  Patient presents with   Code Sepsis   Level of care: Telemetry  Brief Admission History:  Gary Carroll is a 78 year old with extensive history of severe aortic stenosis s/p TAVR, paroxysmal A-fib on Eliquis , CAD, rectal adenocarcinoma, HTN, HLD, DM2, chronic heart failure HFpEF, presented to ED from nursing home with chief complaint of generalized weaknesses, fever, confusion.  Patient is poor historian at this point-electronic record review reveals patient has had multiple hospitalization for multiple comorbidities last discharged on 09/30/2023 after treatment for sepsis, UTI secondary to E. coli, Enterobacter, E. coli bacteremia COVID infection in April, E. coli sepsis.  Labs: CMP potassium 5.3, BUN 28, creatinine 1.54, albumin 2.6, AST 63 ALT 47, lactic acid 5.0 CBC WBC 15.3, hemoglobin 11.9, neutrophil 15.0, INR 2.4, Respiratory panel-influenza A, B, COVID-all negative UA amber, moderate leukocytes, negative for nitrites, bacteria none, WBC > 50  Chest x-ray:  Patchy retrocardiac airspace opacities, likely atelectasis in the setting of low lung volumes. Alternatively, a developing bronchopneumonia could have this appearance in the correct clinical context.  Requested patient to be admitted, as patient meets sepsis criteria-likely source UTI with low probability of pneumonia  Sepsis protocol initiated, IV fluids, broad-spectrum antibiotics cultures been obtained   Assessment and Plan:  Group C Strep Sepsis - Resolved  -urinary source, UTI -Initiated sepsis protocol, aggressive IV fluid resuscitation, IV penicillin  per ID  -blood and urine cultures--Group C Strep positive infection -Lactic acidosis-  LA 5.0 trended down to 1.7 with treatments   Adjustment disorder with depressed mood - treating supportively  Type 2 diabetes mellitus,  controlled -Holding home medication of metformin  -Checking CBG q. ACHS, SSI coverage -Check an A1c:  (last A1c 4 months ago 6.1) CBG (last 3)  Recent Labs    12/30/23 0728 12/31/23 0727 01/01/24 0729  GLUCAP 83 101* 92   Disease of anus and rectum, unspecified - History of rectal adenocarcinoma with chronic fissure - Continue wound care - No signs of infection  Insomnia - Continue home medication melatonin - As needed trazodone   Morbid obesity Body mass index is 34.17 kg/m. 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 with unspecified severity  - Continue wound care per nursing  S/P TAVR (transcatheter aortic valve replacement) - Stable continue meds - discussed with ID and cardiology, planning for TEE early next week  (HFpEF) heart failure with preserved ejection fraction  - POA euvolemic - Last Echo 12/ 2024 EF 60-65%, normal LV function moderate concentric LVH normal right ventricular systolic function, normal mitral and tricuspid valve repair replaced aortic valve - Will monitor I's and O's, daily weight  Intake/Output Summary (Last 24 hours) at 01/01/2024 1018 Last data filed at 01/01/2024 0950 Gross per 24 hour  Intake 524.11 ml  Output 1400 ml  Net -875.89 ml   CAD (coronary artery disease) - Currently stable - Continue home medication including beta-blocker, statins, apixaban  calcium  channel  Aortic stenosis - History of severe aortic stenosis, s/p TAVR Successful TAVR with 26 mm Edwards SAPIEN 3 THV on 02/25/2023   Elevated transaminase level - Likely exacerbated by sepsis, on statins - Continue IV fluids, holding statins - Will avoid hepatotoxins  Benign prostatic hyperplasia with lower urinary tract symptoms -Monitoring for urinary retention -Temporary Foley catheter -Resuming home meds  including oxybutynin   AF (paroxysmal atrial  fibrillation)  - Rate controlled with diltiazem , metoprolol , apixaban   Gastroesophageal reflux disease without esophagitis - Continue PPI  AKI (acute kidney injury) -- Resolved - Monitoring BUN/creatinine closely likely elevated due to sepsis, hypovolemia hypotension - Avoid nephrotoxins, hypotension - Continue IVF  Hyperlipidemia, unspecified - on statins -holding rosuvastatin  d/t transaminitis - Will monitor LFTs  Essential hypertension - Currently in sepsis, ruling out septic shock -Holding BP medications including losartan   Hypokalemia -- check Mg -- 1.6 -- replete   Hypomagnesemia -- IV replacement ordered  -- follow  DVT prophylaxis: apixaban  Code Status: Full  Family Communication:  Disposition: TBD    Consultants:   Procedures:   Antimicrobials:    Subjective: Pt says he wants food brought to him right now.     Objective: Vitals:   01/01/24 0800 01/01/24 0851 01/01/24 0900 01/01/24 0935  BP: 106/68 106/68  107/64  Pulse: 84 84  78  Resp: 20  20 20   Temp:      TempSrc:      SpO2: 100%  97% 100%  Weight:      Height:        Intake/Output Summary (Last 24 hours) at 01/01/2024 1018 Last data filed at 01/01/2024 0950 Gross per 24 hour  Intake 524.11 ml  Output 1400 ml  Net -875.89 ml   Filed Weights   12/29/23 0611 12/31/23 0451 01/01/24 0400  Weight: 93.2 kg 98.2 kg 98.2 kg   Examination:  General exam: Appears calm and comfortable  Respiratory system: Clear to auscultation. Respiratory effort normal. Cardiovascular system: normal S1 & S2 heard. No JVD, murmurs, rubs, gallops or clicks. No pedal edema. Gastrointestinal system: Abdomen is nondistended, soft and nontender. No organomegaly or masses felt. Normal bowel sounds heard. Central nervous system: Alert and oriented. No focal neurological deficits. Extremities: 2+ edema BLEs Symmetric 5 x 5 power. Skin: No rashes, lesions or ulcers. Psychiatry: Judgement and insight appear normal. Mood  & affect appropriate.   Data Reviewed: I have personally reviewed following labs and imaging studies  CBC: Recent Labs  Lab 12/28/23 1225 12/28/23 2239 12/29/23 0433 12/30/23 0505 12/31/23 0228 01/01/24 0455  WBC 15.3* 22.2* 17.7* 7.4 6.3 6.2  NEUTROABS 15.0*  --   --   --   --   --   HGB 11.9* 9.2* 9.3* 8.7* 8.6* 8.4*  HCT 38.3* 29.7* 30.6* 27.7* 27.2* 26.7*  MCV 95.3 96.1 96.2 96.5 93.8 94.7  PLT 159 110* 110* 85* 77* 80*    Basic Metabolic Panel: Recent Labs  Lab 12/28/23 1221 12/28/23 1225 12/28/23 2239 12/29/23 0433 12/30/23 0505 12/31/23 0228 12/31/23 1430 01/01/24 0455  NA  --    < > 135 136 136 138  --  136  K  --    < > 4.4 4.0 3.5 3.3*  --  3.6  CL  --    < > 106 109 106 109  --  108  CO2  --    < > 18* 20* 23 23  --  23  GLUCOSE  --    < > 115* 110* 84 97  --  96  BUN  --    < > 25* 25* 19 16  --  13  CREATININE  --    < > 0.83 0.73 0.57* 0.49*  --  0.48*  CALCIUM   --    < > 7.6* 7.8* 8.1* 7.9*  --  7.6*  MG 2.0  --   --   --   --   --  1.6* 1.6*  PHOS 4.5  --   --   --   --   --   --   --    < > = values in this interval not displayed.    CBG: Recent Labs  Lab 12/28/23 1940 12/29/23 0738 12/30/23 0728 12/31/23 0727 01/01/24 0729  GLUCAP 90 106* 83 101* 92    Recent Results (from the past 240 hours)  Resp panel by RT-PCR (RSV, Flu A&B, Covid) Anterior Nasal Swab     Status: None   Collection Time: 12/28/23 11:52 AM   Specimen: Anterior Nasal Swab  Result Value Ref Range Status   SARS Coronavirus 2 by RT PCR NEGATIVE NEGATIVE Final    Comment: (NOTE) SARS-CoV-2 target nucleic acids are NOT DETECTED.  The SARS-CoV-2 RNA is generally detectable in upper respiratory specimens during the acute phase of infection. The lowest concentration of SARS-CoV-2 viral copies this assay can detect is 138 copies/mL. A negative result does not preclude SARS-Cov-2 infection and should not be used as the sole basis for treatment or other patient management  decisions. A negative result may occur with  improper specimen collection/handling, submission of specimen other than nasopharyngeal swab, presence of viral mutation(s) within the areas targeted by this assay, and inadequate number of viral copies(<138 copies/mL). A negative result must be combined with clinical observations, patient history, and epidemiological information. The expected result is Negative.  Fact Sheet for Patients:  BloggerCourse.com  Fact Sheet for Healthcare Providers:  SeriousBroker.it  This test is no t yet approved or cleared by the [place]  FDA and  has been authorized for detection and/or diagnosis of SARS-CoV-2 by FDA under an Emergency Use Authorization (EUA). This EUA will remain  in effect (meaning this test can be used) for the duration of the COVID-19 declaration under Section 564(b)(1) of the Act, 21 U.S.C.section 360bbb-3(b)(1), unless the authorization is terminated  or revoked sooner.       Influenza A by PCR NEGATIVE NEGATIVE Final   Influenza B by PCR NEGATIVE NEGATIVE Final    Comment: (NOTE) The Xpert Xpress SARS-CoV-2/FLU/RSV plus assay is intended as an aid in the diagnosis of influenza from Nasopharyngeal swab specimens and should not be used as a sole basis for treatment. Nasal washings and aspirates are unacceptable for Xpert Xpress SARS-CoV-2/FLU/RSV testing.  Fact Sheet for Patients: BloggerCourse.com  Fact Sheet for Healthcare Providers: SeriousBroker.it  This test is not yet approved or cleared by the [place]  FDA and has been authorized for detection and/or diagnosis of SARS-CoV-2 by FDA under an Emergency Use Authorization (EUA). This EUA will remain in effect (meaning this test can be used) for the duration of the COVID-19 declaration under Section 564(b)(1) of the Act, 21 U.S.C. section 360bbb-3(b)(1), unless the  authorization is terminated or revoked.     Resp Syncytial Virus by PCR NEGATIVE NEGATIVE Final    Comment: (NOTE) Fact Sheet for Patients: BloggerCourse.com  Fact Sheet for Healthcare Providers: SeriousBroker.it  This test is not yet approved or cleared by the [place]  FDA and has been authorized for detection and/or diagnosis of SARS-CoV-2 by FDA under an Emergency Use Authorization (EUA). This EUA will remain in effect (meaning this test can be used) for the duration of the COVID-19 declaration under Section 564(b)(1) of the Act, 21 U.S.C. section 360bbb-3(b)(1), unless the authorization is terminated or revoked.  Performed at [facility], [address] [place], [place] [address]   Urine Culture     Status: Abnormal  Collection Time: 12/28/23 11:53 AM   Specimen: Urine, Random  Result Value Ref Range Status   Specimen Description   Final    URINE, RANDOM Performed at [facility], [address], [place], [place] [address]    Special Requests   Final    NONE Reflexed from [id number] Performed at [facility], [address], [place], [place] [address]    Culture 40,000 COLONIES/mL YEAST (A)  Final   Report Status 12/31/2023 FINAL  Final  Blood Culture (routine x 2)     Status: Abnormal   Collection Time: 12/28/23 12:25 PM   Specimen: Right Antecubital; Blood  Result Value Ref Range Status   Specimen Description   Final    RIGHT ANTECUBITAL BOTTLES DRAWN AEROBIC AND ANAEROBIC Performed at [facility], [address] [place], [place] [address]    Special Requests   Final    Blood Culture adequate volume Performed at [facility], [address], [place], [place] [address]    Culture  Setup Time   Final    GRAM POSITIVE COCCI IN BOTH AEROBIC AND ANAEROBIC BOTTLES Gram Stain Report Called to,Read Back By and Verified With: C. KINDLEY ON 12/28/2023 @8 :20PM BY T.HAMER  Performed at [facility], [address]  [place], [place] [address]    Culture (A)  Final    STREPTOCOCCUS GROUP C SUSCEPTIBILITIES PERFORMED ON PREVIOUS CULTURE WITHIN THE LAST 5 DAYS. Performed at [facility], [address] [place], [place] [address]    Report Status 12/30/2023 FINAL  Final  Blood Culture (routine x 2)     Status: Abnormal   Collection Time: 12/28/23 12:25 PM   Specimen: Left Antecubital; Blood  Result Value Ref Range Status   Specimen Description   Final    LEFT ANTECUBITAL BOTTLES DRAWN AEROBIC AND ANAEROBIC Performed at [facility], [address], [place], [place] [address]    Special Requests   Final    Blood Culture adequate volume Performed at [facility], [address], [place], [place] [address]    Culture  Setup Time   Final    GRAM POSITIVE COCCI Gram Stain Report Called to,Read Back By and Verified With: KINDLEY,C @ 2020 ON 12/28/23 BY [facility] IN BOTH AEROBIC AND ANAEROBIC BOTTLES GS DONE @ [facility] CRITICAL RESULT CALLED TO, READ BACK BY AND VERIFIED WITHBETHA JAYSON LINGER RN 12/29/2023 @ 0010 BY AB Performed at [facility], [address], [place], [place] [address]    Culture STREPTOCOCCUS GROUP C (A)  Final   Report Status 12/30/2023 FINAL  Final   Organism ID, Bacteria STREPTOCOCCUS GROUP C  Final      Susceptibility   Streptococcus group c - MIC*    CLINDAMYCIN RESISTANT Resistant     AMPICILLIN <=0.25 SENSITIVE Sensitive     ERYTHROMYCIN >=8 RESISTANT Resistant     VANCOMYCIN  0.5 SENSITIVE Sensitive     CEFTRIAXONE  <=0.12 SENSITIVE Sensitive     LEVOFLOXACIN 0.5 SENSITIVE Sensitive     PENICILLIN  <=0.06 SENSITIVE Sensitive     * STREPTOCOCCUS GROUP C  Blood Culture ID Panel (Reflexed)     Status: Abnormal   Collection Time: 12/28/23 12:25 PM  Result Value Ref Range Status   Enterococcus faecalis NOT DETECTED NOT DETECTED Final   Enterococcus Faecium NOT DETECTED NOT DETECTED Final   Listeria monocytogenes NOT DETECTED NOT DETECTED Final   Staphylococcus species NOT DETECTED NOT  DETECTED Final   Staphylococcus aureus (BCID) NOT DETECTED NOT DETECTED Final  Staphylococcus epidermidis NOT DETECTED NOT DETECTED Final   Staphylococcus lugdunensis NOT DETECTED NOT DETECTED Final   Streptococcus species DETECTED (A) NOT DETECTED Final    Comment: Not Enterococcus species, Streptococcus agalactiae, Streptococcus pyogenes, or Streptococcus pneumoniae. CRITICAL RESULT CALLED TO, READ BACK BY AND VERIFIED WITH: C KINDLEY RN 12/29/2023 @ 0010 BY AB    Streptococcus agalactiae NOT DETECTED NOT DETECTED Final   Streptococcus pneumoniae NOT DETECTED NOT DETECTED Final   Streptococcus pyogenes NOT DETECTED NOT DETECTED Final   A.calcoaceticus-baumannii NOT DETECTED NOT DETECTED Final   Bacteroides fragilis NOT DETECTED NOT DETECTED Final   Enterobacterales NOT DETECTED NOT DETECTED Final   Enterobacter cloacae complex NOT DETECTED NOT DETECTED Final   Escherichia coli NOT DETECTED NOT DETECTED Final   Klebsiella aerogenes NOT DETECTED NOT DETECTED Final   Klebsiella oxytoca NOT DETECTED NOT DETECTED Final   Klebsiella pneumoniae NOT DETECTED NOT DETECTED Final   Proteus species NOT DETECTED NOT DETECTED Final   Salmonella species NOT DETECTED NOT DETECTED Final   Serratia marcescens NOT DETECTED NOT DETECTED Final   Haemophilus influenzae NOT DETECTED NOT DETECTED Final   Neisseria meningitidis NOT DETECTED NOT DETECTED Final   Pseudomonas aeruginosa NOT DETECTED NOT DETECTED Final   Stenotrophomonas maltophilia NOT DETECTED NOT DETECTED Final   Candida albicans NOT DETECTED NOT DETECTED Final   Candida auris NOT DETECTED NOT DETECTED Final   Candida glabrata NOT DETECTED NOT DETECTED Final   Candida krusei NOT DETECTED NOT DETECTED Final   Candida parapsilosis NOT DETECTED NOT DETECTED Final   Candida tropicalis NOT DETECTED NOT DETECTED Final   Cryptococcus neoformans/gattii NOT DETECTED NOT DETECTED Final    Comment: Performed at [facility], [address] [place], [place] [address]  MRSA Next Gen by PCR, Nasal     Status: Abnormal   Collection Time: 12/28/23  2:30 PM   Specimen: Nasal Mucosa; Nasal Swab  Result Value Ref Range Status   MRSA by PCR Next Gen DETECTED (A) NOT DETECTED Final    Comment: RESULT CALLED TO, READ BACK BY AND VERIFIED WITH: A SHELTON AT 1743 ON [id number] BY S DALTON (NOTE) The GeneXpert MRSA Assay (FDA approved for NASAL specimens only), is one component of a comprehensive MRSA colonization surveillance program. It is not intended to diagnose MRSA infection nor to guide or monitor treatment for MRSA infections. Test performance is not FDA approved in patients less than 49 years old. Performed at [facility], [address] [place], [place] [address]   Culture, blood (Routine X 2) w Reflex to ID Panel     Status: None (Preliminary result)   Collection Time: 12/29/23 11:31 PM   Specimen: BLOOD  Result Value Ref Range Status   Specimen Description BLOOD LEFT ANTECUBITAL  Final   Special Requests   Final    BOTTLES DRAWN AEROBIC AND ANAEROBIC Blood Culture adequate volume   Culture   Final    NO GROWTH 3 DAYS Performed at [facility], [address], [place], [place] [address]    Report Status PENDING  Incomplete  Culture, blood (Routine X 2) w Reflex to ID Panel     Status: None (Preliminary result)   Collection Time: 12/29/23 11:32 PM   Specimen: BLOOD LEFT HAND  Result Value Ref Range Status   Specimen Description BLOOD LEFT HAND  Final   Special Requests   Final    AEROBIC BOTTLE ONLY Blood Culture results may not be optimal due to an inadequate volume of  blood received in culture bottles   Culture   Final    NO GROWTH 3 DAYS Performed at [facility], [address], [place], [place] [address]    Report Status PENDING  Incomplete     Radiology Studies: DG CHEST PORT 1 VIEW Result Date: 12/31/2023 CLINICAL DATA:  [id number] Status post PICC central line placement [id number]. EXAM: PORTABLE CHEST 1 VIEW  COMPARISON:  12/28/2023. FINDINGS: Bilateral lung fields are clear. Bilateral costophrenic angles are clear. Stable cardio-mediastinal silhouette. Prosthetic aortic valve noted. No acute osseous abnormalities. The soft tissues are within normal limits. Interval placement of right-sided PICC line with its tip overlying the cavoatrial junction region. No right pneumothorax. IMPRESSION: Interval placement of right-sided PICC line with its tip overlying the cavoatrial junction region. No pneumothorax. Electronically Signed   By: Ree Molt M.D.   On: 12/31/2023 16:18   US  EKG SITE RITE Result Date: 12/31/2023 If Site Rite image not attached, placement could not be confirmed due to current cardiac rhythm.   Scheduled Meds:  apixaban   5 mg Oral BID   ascorbic acid   500 mg Oral BID   Chlorhexidine  Gluconate Cloth  6 each Topical Q0600   clotrimazole -betamethasone   1 Application Topical BID   diltiazem   240 mg Oral Daily   feeding supplement  237 mL Oral BID BM   lactobacillus  1 g Oral TID WC   leptospermum manuka honey  1 Application Topical Daily   liver oil-zinc  oxide   Topical BID   melatonin  9 mg Oral QHS   metoprolol  tartrate  25 mg Oral BID   midodrine   5 mg Oral TID WC   multivitamin with minerals  1 tablet Oral Daily   mupirocin  ointment   Nasal BID   oxybutynin   5 mg Oral QPM   sodium chloride  flush  10-40 mL Intracatheter Q12H   sodium chloride  flush  3 mL Intravenous Q12H   sodium chloride  flush  3 mL Intravenous Q12H   zinc  sulfate (50mg  elemental zinc )  220 mg Oral Daily   Continuous Infusions:  penicillin  G potassium 24 Million Units in dextrose  5 % 500 mL CONTINUOUS infusion 20.8 mL/hr at 01/01/24 0440    LOS: 4 days   Time spent: 55 mins   Genoa Freyre, MD How to contact the [facility] Attending or Consulting provider 7A - 7P or covering provider during after hours 7P -7A, for this patient?  Check the care team in [facility] and look for a) attending/consulting [facility] provider  listed and b) the [facility] team listed Log into [email or web address] to find provider on call.  Locate the [facility] provider you are looking for under [facility] and page to a number that you can be directly reached. If you still have difficulty reaching the provider, please page the [facility] (Director on Call) for the Hospitalists listed on amion for assistance.  01/01/2024, 10:18 AM

## 2024-01-01 NOTE — Progress Notes (Signed)
 Nutrition Follow-up  DOCUMENTATION CODES:   Not applicable  INTERVENTION:   D/C Ensure, patient dislikes and is not drinking. Continue Magic cup TID with meals, each supplement provides 290 kcal and 9 grams of protein. MVI with minerals daily. Continue vitamin C  500 mg BID and zinc  220 mg daily to support wound healing.  Vitamin A  pending.  NUTRITION DIAGNOSIS:   Moderate Malnutrition related to chronic illness (chronic heart failure) as evidenced by mild muscle depletion, mild fat depletion, percent weight loss (8% weight loss x 3 months).  New Dx  GOAL:   Patient will meet greater than or equal to 90% of their needs  Progressing   MONITOR:   PO intake, Supplement acceptance  REASON FOR ASSESSMENT:   Malnutrition Screening Tool    ASSESSMENT:   Pt with extensive history of severe aortic stenosis s/p TAVR, paroxysmal A-fib on Eliquis , CAD, rectal adenocarcinoma, HTN, HLD, DM2, chronic heart failure HFpEF, presented from nursing home with chief complaint of generalized weaknesses, fever, confusion.  Patient reports that he does not like Ensure. Also states he doesn't like the magic cups, although he has only had one with breakfast this morning. Encouraged patient to try the magic cups again. He is currently on a regular diet, consuming 50% of meals.   Patient meets criteria for moderate malnutrition, given mild depletion of muscle and subcutaneous fat mass with 8% weight loss x 3 months.  Labs reviewed. Mag 1.6 CBG: 92  Medications reviewed and include vitamin C , lactinex, MVI with minerals, zinc  sulfate. Received IV mag sulfate for repletion this AM.   NUTRITION - FOCUSED PHYSICAL EXAM:  Flowsheet Row Most Recent Value  Orbital Region Mild depletion  Upper Arm Region Mild depletion  Thoracic and Lumbar Region Mild depletion  Buccal Region Mild depletion  Temple Region Mild depletion  Clavicle Bone Region Mild depletion  Clavicle and Acromion Bone Region Mild  depletion  Scapular Bone Region Mild depletion  Dorsal Hand Mild depletion  Patellar Region Mild depletion  Anterior Thigh Region Mild depletion  Posterior Calf Region Unable to assess  Edema (RD Assessment) Mild  Hair Reviewed  Eyes Reviewed  Mouth Reviewed  Skin Reviewed  Nails Reviewed    Diet Order:   Diet Order             Diet regular Room service appropriate? Yes; Fluid consistency: Thin  Diet effective now                   EDUCATION NEEDS:   No education needs have been identified at this time  Skin:  Skin Assessment: Skin Integrity Issues: Skin Integrity Issues:: DTI, Stage II, Stage III DTI: L pretibial Stage II: R buttocks Stage III: anus Other: -  Last BM:  8/14 type 7  Height:   Ht Readings from Last 1 Encounters:  12/28/23 6' 2 (1.88 m)    Weight:   Wt Readings from Last 1 Encounters:  01/01/24 98.2 kg    Ideal Body Weight:  86.4 kg  BMI:  Body mass index is 27.8 kg/m.  Estimated Nutritional Needs:   Kcal:  2200-2400  Protein:  115-130 grams  Fluid:  2.0-2.2 L   Suzen HUNT RD, LDN, CNSC Contact via secure chat. If unavailable, use group chat RD Inpatient.

## 2024-01-01 NOTE — Evaluation (Signed)
 Occupational Therapy Evaluation Patient Details Name: Gary Carroll MRN: [id number] DOB: Mar 23, 1946 Today's Date: 01/01/2024   History of Present Illness   Gary Carroll is a 78 year old with extensive history of severe aortic stenosis s/p TAVR, paroxysmal A-fib on Eliquis , CAD, rectal adenocarcinoma, HTN, HLD, DM2, chronic heart failure HFpEF, presented to ED from nursing home with chief complaint of generalized weaknesses, fever, confusion.     Patient is poor historian at this point-electronic record review reveals patient has had multiple hospitalization for multiple comorbidities last discharged on 09/30/2023 after treatment for sepsis, UTI secondary to E. coli, Enterobacter, E. coli bacteremia COVID infection in April, E. coli sepsis. (per MD)     Clinical Impressions Pt from SNF where he was assisted for all ADL's other than feeding. Pt had been independent prior to recent SNF placement. Pt required max A for bed mobility with poor sitting balance. Unable to attempt sit to stand due to pt's tight lower back and inability to remain upright without support. Pt limited by low back pain and wanted to be put back in supine position. B UE generally weak with more assessment against gravity needed. ADL's would need to be done mostly at bed level or with extra support seated due to pt's poor sitting balance. Max to total assist for lower body ADL's. Pt left in the bed with call bell within reach. Pt will benefit from continued OT in the hospital and recommended venue below to increase strength, balance, and endurance for safe ADL's.         If plan is discharge home, recommend the following:   Two people to help with walking and/or transfers;A lot of help with bathing/dressing/bathroom;Assistance with cooking/housework;Assist for transportation;Help with stairs or ramp for entrance     Functional Status Assessment   Patient has had a recent decline in their functional status and  demonstrates the ability to make significant improvements in function in a reasonable and predictable amount of time.     Equipment Recommendations   None recommended by OT            Precautions/Restrictions   Precautions Precautions: Fall Recall of Precautions/Restrictions: Intact Restrictions Weight Bearing Restrictions Per Provider Order: No     Mobility Bed Mobility Overal bed mobility: Needs Assistance Bed Mobility: Supine to Sit, Sit to Supine     Supine to sit: Max assist, HOB elevated Sit to supine: Max assist   General bed mobility comments: Very stiff back; much assist to pull to sit and remain there. B LE lifted to bring pt back in bed as well.    Transfers                   General transfer comment: not attempted due to poor sitting balance and posterior lean      Balance Overall balance assessment: Needs assistance Sitting-balance support: Bilateral upper extremity supported, Feet supported Sitting balance-Leahy Scale: Poor Sitting balance - Comments: seated at EOB Postural control: Posterior lean                                 ADL either performed or assessed with clinical judgement   ADL Overall ADL's : Needs assistance/impaired Eating/Feeding: Modified independent;Set up;Sitting   Grooming: Minimal assistance;Set up;Bed level   Upper Body Bathing: Moderate assistance;Bed level   Lower Body Bathing: Total assistance;Maximal assistance;Bed level   Upper Body Dressing : Moderate assistance;Sitting;Bed level  Lower Body Dressing: Maximal assistance;Total assistance;Bed level Lower Body Dressing Details (indicate cue type and reason): assisted to don socks Toilet Transfer:  (not attempted due to poor sitting balance)   Toileting- Clothing Manipulation and Hygiene: Maximal assistance;Total assistance;Bed level         General ADL Comments: ADL's would need to be mostly bed level at this time due to pt's poor  sitting balance. Or more assist needed to maintain balance while seated.     Vision Baseline Vision/History: 1 Wears glasses Ability to See in Adequate Light: 1 Impaired Patient Visual Report: Other (comment) (Pt reports he has been having hallucinations.) Vision Assessment?: No apparent visual deficits;Wears glasses for reading     Perception Perception: Not tested       Praxis Praxis: Not tested       Pertinent Vitals/Pain Pain Assessment Pain Assessment: Faces Faces Pain Scale: Hurts even more Pain Location: back Pain Descriptors / Indicators: Grimacing, Guarding Pain Intervention(s): Monitored during session, Repositioned, Limited activity within patient's tolerance     Extremity/Trunk Assessment Upper Extremity Assessment Upper Extremity Assessment: Generalized weakness (Assessed with pt supine with HOB elevated. Full A/ROM in that position. R UE shoulder flexion appeared weak. More assessment needed when pt can tolerate sitting upright against gravity.)   Lower Extremity Assessment Lower Extremity Assessment: Defer to PT evaluation       Communication Communication Communication: No apparent difficulties   Cognition Arousal: Alert Behavior During Therapy: WFL for tasks assessed/performed Cognition: No apparent impairments                               Following commands: Intact       Cueing  General Comments   Cueing Techniques: Verbal cues;Tactile cues                 Home Living Family/patient expects to be discharged to:: Skilled nursing facility                                        Prior Functioning/Environment Prior Level of Function : Needs assist       Physical Assist : Mobility (physical);ADLs (physical) Mobility (physical): Bed mobility;Transfers;Gait;Stairs ADLs (physical): Grooming;Bathing;Dressing;Toileting;IADLs Mobility Comments: Has not been ambulating at SNF per pt's report. ADLs Comments:  Reports as previously (I) but assisted at SNF for all ADL's but feeding.    OT Problem List: Decreased strength;Decreased range of motion;Decreased activity tolerance;Impaired balance (sitting and/or standing);Pain   OT Treatment/Interventions: Self-care/ADL training;Therapeutic exercise;DME and/or AE instruction;Therapeutic activities;Patient/family education;Balance training      OT Goals(Current goals can be found in the care plan section)   Acute Rehab OT Goals Patient Stated Goal: improve function OT Goal Formulation: With patient Time For Goal Achievement: 01/15/24 Potential to Achieve Goals: Fair   OT Frequency:  Min 3X/week    Co-evaluation PT/OT/SLP Co-Evaluation/Treatment: Yes Reason for Co-Treatment: Complexity of the patient's impairments (multi-system involvement)   OT goals addressed during session: ADL's and self-care                       End of Session Nurse Communication: Mobility status  Activity Tolerance: Patient limited by pain. Patient left: in bed;with call bell/phone within reach  OT Visit Diagnosis: Unsteadiness on feet (R26.81);Other abnormalities of gait and mobility (R26.89);Muscle weakness (generalized) (M62.81)  Time: 9052-8991 OT Time Calculation (min): 21 min Charges:  OT General Charges $OT Visit: 1 Visit OT Evaluation $OT Eval Low Complexity: 1 Low  Derreck Wiltsey OT, MOT   Jayson Person 01/01/2024, 12:02 PM

## 2024-01-02 DIAGNOSIS — B954 Other streptococcus as the cause of diseases classified elsewhere: Secondary | ICD-10-CM

## 2024-01-02 LAB — CBC
HCT: 26.9 % — ABNORMAL LOW (ref 39.0–52.0)
Hemoglobin: 8.4 g/dL — ABNORMAL LOW (ref 13.0–17.0)
MCH: 29.6 pg (ref 26.0–34.0)
MCHC: 31.2 g/dL (ref 30.0–36.0)
MCV: 94.7 fL (ref 80.0–100.0)
Platelets: 92 K/uL — ABNORMAL LOW (ref 150–400)
RBC: 2.84 MIL/uL — ABNORMAL LOW (ref 4.22–5.81)
RDW: 16.4 % — ABNORMAL HIGH (ref 11.5–15.5)
WBC: 6.6 K/uL (ref 4.0–10.5)
nRBC: 0.3 % — ABNORMAL HIGH (ref 0.0–0.2)

## 2024-01-02 LAB — BASIC METABOLIC PANEL WITH GFR
Anion gap: 4 — ABNORMAL LOW (ref 5–15)
BUN: 8 mg/dL (ref 8–23)
CO2: 23 mmol/L (ref 22–32)
Calcium: 7.7 mg/dL — ABNORMAL LOW (ref 8.9–10.3)
Chloride: 110 mmol/L (ref 98–111)
Creatinine, Ser: 0.49 mg/dL — ABNORMAL LOW (ref 0.61–1.24)
GFR, Estimated: >60 mL/min (ref >60)
Glucose, Bld: 93 mg/dL (ref 70–99)
Potassium: 3.7 mmol/L (ref 3.5–5.1)
Sodium: 137 mmol/L (ref 135–145)

## 2024-01-02 LAB — HEMOGLOBIN A1C
Hgb A1c MFr Bld: 4.6 % — ABNORMAL LOW (ref 4.8–5.6)
Mean Plasma Glucose: 85.32 mg/dL

## 2024-01-02 LAB — HEPATIC FUNCTION PANEL
ALT: 42 U/L (ref 0–44)
AST: 48 U/L — ABNORMAL HIGH (ref 15–41)
Albumin: 1.8 g/dL — ABNORMAL LOW (ref 3.5–5.0)
Alkaline Phosphatase: 78 U/L (ref 38–126)
Bilirubin, Direct: 0.1 mg/dL (ref 0.0–0.2)
Indirect Bilirubin: 0.2 mg/dL — ABNORMAL LOW (ref 0.3–0.9)
Total Bilirubin: 0.3 mg/dL (ref 0.0–1.2)
Total Protein: 5 g/dL — ABNORMAL LOW (ref 6.5–8.1)

## 2024-01-02 LAB — MAGNESIUM: Magnesium: 1.9 mg/dL (ref 1.7–2.4)

## 2024-01-02 NOTE — Progress Notes (Signed)
 [facility] Infectious Diseases Follow Up Note  Patient Identification: Patient Name: Gary Carroll MRN: [id number] Admit Date: 12/28/2023 11:35 AM Age: 78 y.o.Today's Date: 01/02/2024  Reason for Visit: fu on bacteremia  I connected with the patient by a video enabled telemedicine application and verified that I am speaking with the correct person using two identifiers.  Location: Patient: Gary Carroll  Provider: [facility]  Principal Problem:   Septic shock [facility]) Active Problems:   Essential hypertension   Hyperlipidemia, unspecified   AKI (acute kidney injury) (HCC)   Gastroesophageal reflux disease without esophagitis   AF (paroxysmal atrial fibrillation) (HCC)   Benign prostatic hyperplasia with lower urinary tract symptoms   Urinary tract infection   Elevated transaminase level   Aortic stenosis   CAD (coronary artery disease)   (HFpEF) heart failure with preserved ejection fraction (HCC)   S/P TAVR (transcatheter aortic valve replacement)   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 with unspecified severity (HCC)   Morbid obesity (HCC)   Insomnia   Disease of anus and rectum, unspecified   Chronic venous insufficiency   Complication of diabetes mellitus (HCC)   Sepsis secondary to UTI (HCC)   Adjustment disorder with depressed mood   Sepsis (HCC)   Hypotensive episode   Bacteremia   Lactic acidosis  Antibiotics:  Vancomycin  8/10 Pip-tazo 8/10 Ceftriaxone  8/10 Azithromycin  8/10   Lines/Hardware: PICC placed 8/13, bilateral prosthetic knee  Interval Events: Remains afebrile Labs remarkable for albumin 1.8, AST 48, improving, hemoglobin stable at 8.4, platelets 92 Foley's placed for urinary retention   Assessment 78 year old male with multiple comorbidities as below including h/o rectal cancer with chronic fissure, DM, A-fib, CAD, HLD, HTN, COPD on 2 L nasal cannula severe aortic  stenosis s/p TAVR, bilateral knee arthroplasty, chronic rt leg wound, strep dysgalactiae bacteremia in 2018, E coli bacteremia/UTI in May 2025 who presented to the ED on 8/10 for fever, weakness and confusion.  At ED febrile with sepsis/shock, hyperkalemia, AKI. ID engaged for   # Strep group C bacteremia r/o endocarditis  - unclear source - no concerns noted in bilateral prosthetic knee - 8/11 blood culture NG in 4 days - PICC placed on 8/13 - 8/12 TTE negative for vegetations or endocarditis, poor study   -  # ? Pneumonia in CXR -no respiratory symptoms documented, stable on 2 L nasal cannula   # Anal fissure/chronic wound rt leg-does not appear to be infected based on pictures in media, wound care and d/w primary team  Recommendations - Continue IV penicillin  G - Fu repeat blood cultures on 8/11 - Monitor CBC, BMP,  - Monitor for metastatic sites of infection  - TEE planned on 8/18 - Maintain standard isolation precautions I will see back on Monday. Dr Luiz covering this weekend if any questions or concerns.   Rest of the management as per the primary team. Thank you for the consult. Please page with pertinent questions or concerns.  ______________________________________________________________________ Subjective patient seen and examined at the bedside. No complaints. RN at bedside, no concerns  Vitals BP (!) 103/58 (BP Location: Left Arm)   Pulse 61   Temp 98.7 F (37.1 C) (Oral)   Resp 20   Ht 6' 2 (1.88 m)   Wt 99.5 kg   SpO2 99%   BMI 28.16 kg/m     Physical Exam Lying in the bed and appears comfortable Able to speak in full sentences  Pertinent Microbiology Results for orders placed or performed  during the hospital encounter of 12/28/23  Resp panel by RT-PCR (RSV, Flu A&B, Covid) Anterior Nasal Swab     Status: None   Collection Time: 12/28/23 11:52 AM   Specimen: Anterior Nasal Swab  Result Value Ref Range Status   SARS Coronavirus 2 by RT PCR  NEGATIVE NEGATIVE Final    Comment: (NOTE) SARS-CoV-2 target nucleic acids are NOT DETECTED.  The SARS-CoV-2 RNA is generally detectable in upper respiratory specimens during the acute phase of infection. The lowest concentration of SARS-CoV-2 viral copies this assay can detect is 138 copies/mL. A negative result does not preclude SARS-Cov-2 infection and should not be used as the sole basis for treatment or other patient management decisions. A negative result may occur with  improper specimen collection/handling, submission of specimen other than nasopharyngeal swab, presence of viral mutation(s) within the areas targeted by this assay, and inadequate number of viral copies(<138 copies/mL). A negative result must be combined with clinical observations, patient history, and epidemiological information. The expected result is Negative.  Fact Sheet for Patients:  BloggerCourse.com  Fact Sheet for Healthcare Providers:  SeriousBroker.it  This test is no t yet approved or cleared by the [place]  FDA and  has been authorized for detection and/or diagnosis of SARS-CoV-2 by FDA under an Emergency Use Authorization (EUA). This EUA will remain  in effect (meaning this test can be used) for the duration of the COVID-19 declaration under Section 564(b)(1) of the Act, 21 U.S.C.section 360bbb-3(b)(1), unless the authorization is terminated  or revoked sooner.       Influenza A by PCR NEGATIVE NEGATIVE Final   Influenza B by PCR NEGATIVE NEGATIVE Final    Comment: (NOTE) The Xpert Xpress SARS-CoV-2/FLU/RSV plus assay is intended as an aid in the diagnosis of influenza from Nasopharyngeal swab specimens and should not be used as a sole basis for treatment. Nasal washings and aspirates are unacceptable for Xpert Xpress SARS-CoV-2/FLU/RSV testing.  Fact Sheet for Patients: BloggerCourse.com  Fact Sheet for  Healthcare Providers: SeriousBroker.it  This test is not yet approved or cleared by the [place]  FDA and has been authorized for detection and/or diagnosis of SARS-CoV-2 by FDA under an Emergency Use Authorization (EUA). This EUA will remain in effect (meaning this test can be used) for the duration of the COVID-19 declaration under Section 564(b)(1) of the Act, 21 U.S.C. section 360bbb-3(b)(1), unless the authorization is terminated or revoked.     Resp Syncytial Virus by PCR NEGATIVE NEGATIVE Final    Comment: (NOTE) Fact Sheet for Patients: BloggerCourse.com  Fact Sheet for Healthcare Providers: SeriousBroker.it  This test is not yet approved or cleared by the [place]  FDA and has been authorized for detection and/or diagnosis of SARS-CoV-2 by FDA under an Emergency Use Authorization (EUA). This EUA will remain in effect (meaning this test can be used) for the duration of the COVID-19 declaration under Section 564(b)(1) of the Act, 21 U.S.C. section 360bbb-3(b)(1), unless the authorization is terminated or revoked.  Performed at [facility], [address], [place], [place] [address]   Urine Culture     Status: Abnormal   Collection Time: 12/28/23 11:53 AM   Specimen: Urine, Random  Result Value Ref Range Status   Specimen Description   Final    URINE, RANDOM Performed at [facility], [address], [place], [place] [address]    Special Requests   Final    NONE Reflexed from [phone] Performed at [facility], [address], [place], [place] [address]  Culture 40,000 COLONIES/mL YEAST (A)  Final   Report Status 12/31/2023 FINAL  Final  Blood Culture (routine x 2)     Status: Abnormal   Collection Time: 12/28/23 12:25 PM   Specimen: Right Antecubital; Blood  Result Value Ref Range Status   Specimen Description   Final    RIGHT ANTECUBITAL BOTTLES DRAWN AEROBIC AND  ANAEROBIC Performed at [facility], [address] [place], [place] [address]    Special Requests   Final    Blood Culture adequate volume Performed at [facility], [address] [place], [place] [address]    Culture  Setup Time   Final    GRAM POSITIVE COCCI IN BOTH AEROBIC AND ANAEROBIC BOTTLES Gram Stain Report Called to,Read Back By and Verified With: C. KINDLEY ON 12/28/2023 @8 :20PM BY T.HAMER  Performed at [facility], [address], [place], [place] [address]    Culture (A)  Final    STREPTOCOCCUS GROUP C SUSCEPTIBILITIES PERFORMED ON PREVIOUS CULTURE WITHIN THE LAST 5 DAYS. Performed at [facility], [address], [place], [place] [address]    Report Status 12/30/2023 FINAL  Final  Blood Culture (routine x 2)     Status: Abnormal   Collection Time: 12/28/23 12:25 PM   Specimen: Left Antecubital; Blood  Result Value Ref Range Status   Specimen Description   Final    LEFT ANTECUBITAL BOTTLES DRAWN AEROBIC AND ANAEROBIC Performed at [facility], [address], [place], [place] [address]    Special Requests   Final    Blood Culture adequate volume Performed at [facility], [address], [place], [place] [address]    Culture  Setup Time   Final    GRAM POSITIVE COCCI Gram Stain Report Called to,Read Back By and Verified With: KINDLEY,C @ 2020 ON 12/28/23 BY [facility] IN BOTH AEROBIC AND ANAEROBIC BOTTLES GS DONE @ [facility] CRITICAL RESULT CALLED TO, READ BACK BY AND VERIFIED WITHBETHA JAYSON LINGER RN 12/29/2023 @ 0010 BY AB Performed at [facility], [address], [place], [place] [address]    Culture STREPTOCOCCUS GROUP C (A)  Final   Report Status 12/30/2023 FINAL  Final   Organism ID, Bacteria STREPTOCOCCUS GROUP C  Final      Susceptibility   Streptococcus group c - MIC*    CLINDAMYCIN RESISTANT Resistant     AMPICILLIN <=0.25 SENSITIVE Sensitive     ERYTHROMYCIN >=8 RESISTANT Resistant     VANCOMYCIN  0.5 SENSITIVE Sensitive     CEFTRIAXONE  <=0.12 SENSITIVE  Sensitive     LEVOFLOXACIN 0.5 SENSITIVE Sensitive     PENICILLIN  <=0.06 SENSITIVE Sensitive     * STREPTOCOCCUS GROUP C  Blood Culture ID Panel (Reflexed)     Status: Abnormal   Collection Time: 12/28/23 12:25 PM  Result Value Ref Range Status   Enterococcus faecalis NOT DETECTED NOT DETECTED Final   Enterococcus Faecium NOT DETECTED NOT DETECTED Final   Listeria monocytogenes NOT DETECTED NOT DETECTED Final   Staphylococcus species NOT DETECTED NOT DETECTED Final   Staphylococcus aureus (BCID) NOT DETECTED NOT DETECTED Final   Staphylococcus epidermidis NOT DETECTED NOT DETECTED Final   Staphylococcus lugdunensis NOT DETECTED NOT DETECTED Final   Streptococcus species DETECTED (A) NOT DETECTED Final    Comment: Not Enterococcus species, Streptococcus agalactiae, Streptococcus pyogenes, or Streptococcus pneumoniae. CRITICAL RESULT CALLED TO, READ BACK BY AND VERIFIED WITH: C KINDLEY RN 12/29/2023 @ 0010 BY AB    Streptococcus agalactiae NOT DETECTED NOT DETECTED Final  Streptococcus pneumoniae NOT DETECTED NOT DETECTED Final   Streptococcus pyogenes NOT DETECTED NOT DETECTED Final   A.calcoaceticus-baumannii NOT DETECTED NOT DETECTED Final   Bacteroides fragilis NOT DETECTED NOT DETECTED Final   Enterobacterales NOT DETECTED NOT DETECTED Final   Enterobacter cloacae complex NOT DETECTED NOT DETECTED Final   Escherichia coli NOT DETECTED NOT DETECTED Final   Klebsiella aerogenes NOT DETECTED NOT DETECTED Final   Klebsiella oxytoca NOT DETECTED NOT DETECTED Final   Klebsiella pneumoniae NOT DETECTED NOT DETECTED Final   Proteus species NOT DETECTED NOT DETECTED Final   Salmonella species NOT DETECTED NOT DETECTED Final   Serratia marcescens NOT DETECTED NOT DETECTED Final   Haemophilus influenzae NOT DETECTED NOT DETECTED Final   Neisseria meningitidis NOT DETECTED NOT DETECTED Final   Pseudomonas aeruginosa NOT DETECTED NOT DETECTED Final   Stenotrophomonas maltophilia NOT  DETECTED NOT DETECTED Final   Candida albicans NOT DETECTED NOT DETECTED Final   Candida auris NOT DETECTED NOT DETECTED Final   Candida glabrata NOT DETECTED NOT DETECTED Final   Candida krusei NOT DETECTED NOT DETECTED Final   Candida parapsilosis NOT DETECTED NOT DETECTED Final   Candida tropicalis NOT DETECTED NOT DETECTED Final   Cryptococcus neoformans/gattii NOT DETECTED NOT DETECTED Final    Comment: Performed at [facility], [address], [place], [place] [address]  MRSA Next Gen by PCR, Nasal     Status: Abnormal   Collection Time: 12/28/23  2:30 PM   Specimen: Nasal Mucosa; Nasal Swab  Result Value Ref Range Status   MRSA by PCR Next Gen DETECTED (A) NOT DETECTED Final    Comment: RESULT CALLED TO, READ BACK BY AND VERIFIED WITH: A SHELTON AT 1743 ON [id number] BY S DALTON (NOTE) The GeneXpert MRSA Assay (FDA approved for NASAL specimens only), is one component of a comprehensive MRSA colonization surveillance program. It is not intended to diagnose MRSA infection nor to guide or monitor treatment for MRSA infections. Test performance is not FDA approved in patients less than 100 years old. Performed at [facility], [address] [place], [place] [address]   Culture, blood (Routine X 2) w Reflex to ID Panel     Status: None (Preliminary result)   Collection Time: 12/29/23 11:31 PM   Specimen: BLOOD  Result Value Ref Range Status   Specimen Description BLOOD LEFT ANTECUBITAL  Final   Special Requests   Final    BOTTLES DRAWN AEROBIC AND ANAEROBIC Blood Culture adequate volume   Culture   Final    NO GROWTH 4 DAYS Performed at [facility], [address] [place], [place] [address]    Report Status PENDING  Incomplete  Culture, blood (Routine X 2) w Reflex to ID Panel     Status: None (Preliminary result)   Collection Time: 12/29/23 11:32 PM   Specimen: BLOOD LEFT HAND  Result Value Ref Range Status   Specimen Description BLOOD LEFT HAND  Final   Special  Requests   Final    AEROBIC BOTTLE ONLY Blood Culture results may not be optimal due to an inadequate volume of blood received in culture bottles   Culture   Final    NO GROWTH 4 DAYS Performed at [facility], [address] [place], [place] [address]    Report Status PENDING  Incomplete    Pertinent Lab.    Latest Ref Rng & Units 01/02/2024    4:55 AM 01/01/2024    4:55 AM 12/31/2023    2:28 AM  CBC  WBC 4.0 - 10.5 K/uL 6.6  6.2  6.3   Hemoglobin 13.0 - 17.0 g/dL 8.4  8.4  8.6   Hematocrit 39.0 - 52.0 % 26.9  26.7  27.2   Platelets 150 - 400 K/uL 92  80  77       Latest Ref Rng & Units 01/02/2024    4:55 AM 01/01/2024    4:55 AM 12/31/2023    2:28 AM  CMP  Glucose 70 - 99 mg/dL 93  96  97   BUN 8 - 23 mg/dL 8  13  16    Creatinine 0.61 - 1.24 mg/dL 9.50  9.51  9.50   Sodium 135 - 145 mmol/L 137  136  138   Potassium 3.5 - 5.1 mmol/L 3.7  3.6  3.3   Chloride 98 - 111 mmol/L 110  108  109   CO2 22 - 32 mmol/L 23  23  23    Calcium  8.9 - 10.3 mg/dL 7.7  7.6  7.9   Total Protein 6.5 - 8.1 g/dL 5.0   4.9   Total Bilirubin 0.0 - 1.2 mg/dL 0.3   0.5   Alkaline Phos 38 - 126 U/L 78   86   AST 15 - 41 U/L 48   67   ALT 0 - 44 U/L 42   40      Pertinent Imaging today Plain films and CT images have been personally visualized and interpreted; radiology reports have been reviewed. Decision making incorporated into the Impression  DG CHEST PORT 1 VIEW Result Date: 12/31/2023 CLINICAL DATA:  [id number] Status post PICC central line placement [id number]. EXAM: PORTABLE CHEST 1 VIEW COMPARISON:  12/28/2023. FINDINGS: Bilateral lung fields are clear. Bilateral costophrenic angles are clear. Stable cardio-mediastinal silhouette. Prosthetic aortic valve noted. No acute osseous abnormalities. The soft tissues are within normal limits. Interval placement of right-sided PICC line with its tip overlying the cavoatrial junction region. No right pneumothorax. IMPRESSION: Interval placement of right-sided PICC  line with its tip overlying the cavoatrial junction region. No pneumothorax. Electronically Signed   By: Ree Molt M.D.   On: 12/31/2023 16:18   I discussed the limitations of evaluation and management by telemedicine. The patient expressed understanding and agreed to proceed.   I discussed the assessment and treatment plan with the patient. The patient was provided an opportunity to ask questions and all were answered. The patient agreed with the plan and demonstrated an understanding of the instructions.   The patient was advised to call back or seek an in-person evaluation if the symptoms worsen or if the condition fails to improve as anticipated.  I spent 37 minutes involved in face-to-face and non-face-to-face activities for this patient on the day of the visit. Professional time spent includes the following activities: Preparing to see the patient (review of tests), Obtaining and reviewing separately obtained history (hospitalist progress note from today), Performing a medically appropriate examination and evaluation , Ordering medications, referring and communicating with other health care professionals, Documenting clinical information in the EMR, Independently interpreting results (not separately reported), Communicating results to the patient, Counseling and educating the patient and Care coordination (not separately reported).   Plan d/w requesting provider as well as ID pharm D  Of note, portions of this note may have been created with voice recognition software. While this note has been edited for accuracy, occasional wrong-word or 'sound-a-like' substitutions may have occurred due to the inherent limitations of voice recognition software.   Electronically  signed by:   Annalee Orem, MD Infectious Disease Physician [facility] Pager: [phone]

## 2024-01-02 NOTE — Progress Notes (Signed)
   [facility] has been requested to perform a transesophageal echocardiogram on [facility] for bacteremia.    The patient does NOT have any absolute or relative contraindications to a Transesophageal Echocardiogram (TEE).  The patient has: History of Severe Valve Disease (stenosis or regurgitation): Underwent TAVR in 02/2023.  After careful review of history and examination, the risks and benefits of transesophageal echocardiogram have been explained including risks of esophageal damage, perforation (1:10,000 risk), bleeding, pharyngeal hematoma as well as other potential complications associated with conscious sedation including aspiration, arrhythmia, respiratory failure and death. Alternatives to treatment were discussed, questions were answered. Patient is willing to proceed. A&Ox3 at the time of this encounter. No longer on pressor support. Labs today show Hgb is at 8.4 and platelets at 92 K.   Checked with anesthesia and echo in regards to availability. TEE scheduled for Monday (01/05/2024) at 1300.   Signed, Laymon CHRISTELLA Qua, PA-C  01/02/2024 9:45 AM

## 2024-01-02 NOTE — Progress Notes (Signed)
   01/02/24 0807  Assess: MEWS Score  BP 133/87  MAP (mmHg) 101  Pulse Rate (!) 130  Assess: MEWS Score  MEWS Temp 0  MEWS Systolic 0  MEWS Pulse 3  MEWS RR 0  MEWS LOC 0  MEWS Score 3  MEWS Score Color Yellow  Assess: if the MEWS score is Yellow or Red  Were vital signs accurate and taken at a resting state? Yes  Does the patient meet 2 or more of the SIRS criteria? No  MEWS guidelines implemented  Yes, yellow  Treat  MEWS Interventions Considered administering scheduled or prn medications/treatments as ordered  Take Vital Signs  Increase Vital Sign Frequency  Yellow: Q2hr x1, continue Q4hrs until patient remains green for 12hrs  Escalate  MEWS: Escalate Yellow: Discuss with charge nurse and consider notifying provider and/or RRT  Notify: Charge Nurse/RN  Name of Charge Nurse/RN Notified crystal reynolds,RN  Provider Notification  Provider Name/Title Dr. [name]  Date Provider Notified 01/02/24  Time Provider Notified [phone]  Method of Notification Page  Notification Reason Other (Comment) (yellow mews)  Provider response No new orders  Assess: SIRS CRITERIA  SIRS Temperature  0  SIRS Respirations  0  SIRS Pulse 1  SIRS WBC 0  SIRS Score Sum  1

## 2024-01-02 NOTE — Plan of Care (Signed)
  Problem: Respiratory: Goal: Ability to maintain adequate ventilation will improve Outcome: Progressing   Problem: Education: Goal: Knowledge of General Education information will improve Description: Including pain rating scale, medication(s)/side effects and non-pharmacologic comfort measures Outcome: Progressing   Problem: Health Behavior/Discharge Planning: Goal: Ability to manage health-related needs will improve Outcome: Progressing   Problem: Clinical Measurements: Goal: Ability to maintain clinical measurements within normal limits will improve Outcome: Progressing   Problem: Activity: Goal: Risk for activity intolerance will decrease Outcome: Progressing   Problem: Coping: Goal: Level of anxiety will decrease Outcome: Progressing   Problem: Elimination: Goal: Will not experience complications related to bowel motility Outcome: Progressing

## 2024-01-02 NOTE — Plan of Care (Signed)
   Problem: Fluid Volume: Goal: Hemodynamic stability will improve Outcome: Progressing   Problem: Clinical Measurements: Goal: Diagnostic test results will improve Outcome: Progressing Goal: Signs and symptoms of infection will decrease Outcome: Progressing

## 2024-01-02 NOTE — Progress Notes (Signed)
 PROGRESS NOTE   Gary Carroll  FMW:[id number] DOB: [birth year] DOA: 12/28/2023 PCP: [facility]   Chief Complaint  Patient presents with   Code Sepsis   Level of care: Telemetry  Brief Admission History:  Gary Carroll is a 79 year old with extensive history of severe aortic stenosis s/p TAVR, paroxysmal A-fib on Eliquis , CAD, rectal adenocarcinoma, HTN, HLD, DM2, chronic heart failure HFpEF, presented to ED from nursing home with chief complaint of generalized weaknesses, fever, confusion.  Patient is poor historian at this point-electronic record review reveals patient has had multiple hospitalization for multiple comorbidities last discharged on 09/30/2023 after treatment for sepsis, UTI secondary to E. coli, Enterobacter, E. coli bacteremia COVID infection in April, E. coli sepsis.  Labs: CMP potassium 5.3, BUN 28, creatinine 1.54, albumin 2.6, AST 63 ALT 47, lactic acid 5.0 CBC WBC 15.3, hemoglobin 11.9, neutrophil 15.0, INR 2.4, Respiratory panel-influenza A, B, COVID-all negative UA amber, moderate leukocytes, negative for nitrites, bacteria none, WBC > 50  Chest x-ray:  Patchy retrocardiac airspace opacities, likely atelectasis in the setting of low lung volumes. Alternatively, a developing bronchopneumonia could have this appearance in the correct clinical context.  Requested patient to be admitted, as patient meets sepsis criteria-likely source UTI with low probability of pneumonia  Sepsis protocol initiated, IV fluids, broad-spectrum antibiotics cultures been obtained   Assessment and Plan:  Group C Strep Sepsis - Resolved  -urinary source, UTI -Initiated sepsis protocol, aggressive IV fluid resuscitation, IV penicillin  per ID  -blood and urine cultures--Group C Strep positive infection -Lactic acidosis-  LA 5.0 trended down to 1.7 with treatments  -TEE planned for Monday 01/05/24   Adjustment disorder with depressed mood - treating  supportively  Type 2 diabetes mellitus, controlled -Holding home medication of metformin  -Checking CBG q. ACHS, SSI coverage -Check an A1c:  (last A1c 4 months ago 6.1) CBG (last 3)  Recent Labs    12/31/23 0727 01/01/24 0729  GLUCAP 101* 92   Disease of anus and rectum, unspecified - History of rectal adenocarcinoma with chronic fissure - Continue wound care - No signs of infection  Insomnia - Continue home medication melatonin - As needed trazodone   Morbid obesity Body mass index is 34.17 kg/m. 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 with unspecified severity  - Continue wound care per nursing  S/P TAVR (transcatheter aortic valve replacement) - Stable continue meds - discussed with ID and cardiology, planning for TEE on Mon 8/18  (HFpEF) heart failure with preserved ejection fraction  - POA euvolemic - Last Echo 12/ 2024 EF 60-65%, normal LV function moderate concentric LVH normal right ventricular systolic function, normal mitral and tricuspid valve repair replaced aortic valve - Will monitor I's and O's, daily weight  Intake/Output Summary (Last 24 hours) at 01/02/2024 1358 Last data filed at 01/02/2024 1136 Gross per 24 hour  Intake 1006.13 ml  Output 3250 ml  Net -2243.87 ml   CAD (coronary artery disease) - Currently stable - Continue home medication including beta-blocker, statins, apixaban  calcium  channel  Aortic stenosis - History of severe aortic stenosis, s/p TAVR Successful TAVR with 26 mm Edwards SAPIEN 3 THV on 02/25/2023   Elevated transaminase level - Likely exacerbated by sepsis, on statins - Continue IV fluids, holding statins - Will avoid hepatotoxins  Benign prostatic hyperplasia with lower urinary tract symptoms Acute urinary retention -Temporary Foley catheter initially  placed, then was removed but needed to be  replaced due to recurrent urinary retention -Resuming home meds including oxybutynin  -plan is to continue foley cath for now and follow up with urologist outpatient for foley removal and voiding trial  AF (paroxysmal atrial fibrillation)  - Rate controlled with diltiazem , metoprolol , apixaban   Gastroesophageal reflux disease without esophagitis - Continue PPI  AKI (acute kidney injury) -- Resolved - Monitoring BUN/creatinine closely likely elevated due to sepsis, hypovolemia hypotension - Avoid nephrotoxins, hypotension - Continue IVF  Hyperlipidemia, unspecified - on statins -holding rosuvastatin  d/t transaminitis - Will monitor LFTs  Essential hypertension - Currently in sepsis, ruling out septic shock -Holding BP medications including losartan   Hypokalemia -- check Mg -- 1.6 -- repleted   Hypomagnesemia -- IV replacement ordered  -- repleted   DVT prophylaxis: apixaban  Code Status: Full  Family Communication:  Disposition: SNF placement early next week     Consultants:   Procedures:   Antimicrobials:    Subjective: Pt says he wants food brought to him right now.     Objective: Vitals:   01/02/24 0501 01/02/24 0807 01/02/24 1000 01/02/24 1136  BP: 127/79 133/87  (!) 103/58  Pulse: 83 (!) 130  61  Resp: 18  18 20   Temp: 97.9 F (36.6 C)   98.7 F (37.1 C)  TempSrc: Oral   Oral  SpO2: 98%   99%  Weight: 99.5 kg     Height:        Intake/Output Summary (Last 24 hours) at 01/02/2024 1358 Last data filed at 01/02/2024 1136 Gross per 24 hour  Intake 1006.13 ml  Output 3250 ml  Net -2243.87 ml   Filed Weights   12/31/23 0451 01/01/24 0400 01/02/24 0501  Weight: 98.2 kg 98.2 kg 99.5 kg   Examination:  General exam: Appears calm and comfortable  Respiratory system: Clear to auscultation. Respiratory effort normal. Cardiovascular system: normal S1 & S2 heard. No JVD, murmurs, rubs, gallops or clicks. No pedal edema. Gastrointestinal system: Abdomen is  nondistended, soft and nontender. No organomegaly or masses felt. Normal bowel sounds heard. Central nervous system: Alert and oriented. No focal neurological deficits. Extremities: 2+ edema BLEs Symmetric 5 x 5 power. Skin: No rashes, lesions or ulcers. Psychiatry: Judgement and insight appear normal. Mood & affect appropriate.   Data Reviewed: I have personally reviewed following labs and imaging studies  CBC: Recent Labs  Lab 12/28/23 1225 12/28/23 2239 12/29/23 0433 12/30/23 0505 12/31/23 0228 01/01/24 0455 01/02/24 0455  WBC 15.3*   < > 17.7* 7.4 6.3 6.2 6.6  NEUTROABS 15.0*  --   --   --   --   --   --   HGB 11.9*   < > 9.3* 8.7* 8.6* 8.4* 8.4*  HCT 38.3*   < > 30.6* 27.7* 27.2* 26.7* 26.9*  MCV 95.3   < > 96.2 96.5 93.8 94.7 94.7  PLT 159   < > 110* 85* 77* 80* 92*   < > = values in this interval not displayed.    Basic Metabolic Panel: Recent Labs  Lab 12/28/23 1221 12/28/23 1225 12/29/23 0433 12/30/23 0505 12/31/23 0228 12/31/23 1430 01/01/24 0455 01/02/24 0455  NA  --    < > 136 136 138  --  136 137  K  --    < > 4.0 3.5 3.3*  --  3.6 3.7  CL  --    < > 109 106 109  --  108 110  CO2  --    < >  20* 23 23  --  23 23  GLUCOSE  --    < > 110* 84 97  --  96 93  BUN  --    < > 25* 19 16  --  13 8  CREATININE  --    < > 0.73 0.57* 0.49*  --  0.48* 0.49*  CALCIUM   --    < > 7.8* 8.1* 7.9*  --  7.6* 7.7*  MG 2.0  --   --   --   --  1.6* 1.6* 1.9  PHOS 4.5  --   --   --   --   --   --   --    < > = values in this interval not displayed.    CBG: Recent Labs  Lab 12/28/23 1940 12/29/23 0738 12/30/23 0728 12/31/23 0727 01/01/24 0729  GLUCAP 90 106* 83 101* 92    Recent Results (from the past 240 hours)  Resp panel by RT-PCR (RSV, Flu A&B, Covid) Anterior Nasal Swab     Status: None   Collection Time: 12/28/23 11:52 AM   Specimen: Anterior Nasal Swab  Result Value Ref Range Status   SARS Coronavirus 2 by RT PCR NEGATIVE NEGATIVE Final    Comment:  (NOTE) SARS-CoV-2 target nucleic acids are NOT DETECTED.  The SARS-CoV-2 RNA is generally detectable in upper respiratory specimens during the acute phase of infection. The lowest concentration of SARS-CoV-2 viral copies this assay can detect is 138 copies/mL. A negative result does not preclude SARS-Cov-2 infection and should not be used as the sole basis for treatment or other patient management decisions. A negative result may occur with  improper specimen collection/handling, submission of specimen other than nasopharyngeal swab, presence of viral mutation(s) within the areas targeted by this assay, and inadequate number of viral copies(<138 copies/mL). A negative result must be combined with clinical observations, patient history, and epidemiological information. The expected result is Negative.  Fact Sheet for Patients:  BloggerCourse.com  Fact Sheet for Healthcare Providers:  SeriousBroker.it  This test is no t yet approved or cleared by the [place]  FDA and  has been authorized for detection and/or diagnosis of SARS-CoV-2 by FDA under an Emergency Use Authorization (EUA). This EUA will remain  in effect (meaning this test can be used) for the duration of the COVID-19 declaration under Section 564(b)(1) of the Act, 21 U.S.C.section 360bbb-3(b)(1), unless the authorization is terminated  or revoked sooner.       Influenza A by PCR NEGATIVE NEGATIVE Final   Influenza B by PCR NEGATIVE NEGATIVE Final    Comment: (NOTE) The Xpert Xpress SARS-CoV-2/FLU/RSV plus assay is intended as an aid in the diagnosis of influenza from Nasopharyngeal swab specimens and should not be used as a sole basis for treatment. Nasal washings and aspirates are unacceptable for Xpert Xpress SARS-CoV-2/FLU/RSV testing.  Fact Sheet for Patients: BloggerCourse.com  Fact Sheet for Healthcare  Providers: SeriousBroker.it  This test is not yet approved or cleared by the [place]  FDA and has been authorized for detection and/or diagnosis of SARS-CoV-2 by FDA under an Emergency Use Authorization (EUA). This EUA will remain in effect (meaning this test can be used) for the duration of the COVID-19 declaration under Section 564(b)(1) of the Act, 21 U.S.C. section 360bbb-3(b)(1), unless the authorization is terminated or revoked.     Resp Syncytial Virus by PCR NEGATIVE NEGATIVE Final    Comment: (NOTE) Fact Sheet for Patients: BloggerCourse.com  Fact Sheet for Healthcare Providers:  SeriousBroker.it  This test is not yet approved or cleared by the [place]  FDA and has been authorized for detection and/or diagnosis of SARS-CoV-2 by FDA under an Emergency Use Authorization (EUA). This EUA will remain in effect (meaning this test can be used) for the duration of the COVID-19 declaration under Section 564(b)(1) of the Act, 21 U.S.C. section 360bbb-3(b)(1), unless the authorization is terminated or revoked.  Performed at [facility], [address], [place], [place] [address]   Urine Culture     Status: Abnormal   Collection Time: 12/28/23 11:53 AM   Specimen: Urine, Random  Result Value Ref Range Status   Specimen Description   Final    URINE, RANDOM Performed at [facility], [address], [place], [place] [address]    Special Requests   Final    NONE Reflexed from [id number] Performed at [facility], [address] [place] [address] [place], [place] [address]    Culture 40,000 COLONIES/mL YEAST (A)  Final   Report Status 12/31/2023 FINAL  Final  Blood Culture (routine x 2)     Status: Abnormal   Collection Time: 12/28/23 12:25 PM   Specimen: Right Antecubital; Blood  Result Value Ref Range Status   Specimen Description   Final    RIGHT ANTECUBITAL BOTTLES DRAWN AEROBIC AND ANAEROBIC Performed at [facility], [address] [place], [place] [address]    Special Requests   Final    Blood Culture adequate volume Performed at [facility], [address] [place], [place] [address]    Culture  Setup Time   Final    GRAM POSITIVE COCCI IN BOTH AEROBIC AND ANAEROBIC BOTTLES Gram Stain Report Called to,Read Back By and Verified With: C. KINDLEY ON 12/28/2023 @8 :20PM BY T.HAMER  Performed at [facility], [address] [place], [place] [address]    Culture (A)  Final    STREPTOCOCCUS GROUP C SUSCEPTIBILITIES PERFORMED ON PREVIOUS CULTURE WITHIN THE LAST 5 DAYS. Performed at [facility], [address], [place], [place] [address]    Report Status 12/30/2023 FINAL  Final  Blood Culture (routine x 2)     Status: Abnormal   Collection Time: 12/28/23 12:25 PM   Specimen: Left Antecubital; Blood  Result Value Ref Range Status   Specimen Description   Final    LEFT ANTECUBITAL BOTTLES DRAWN AEROBIC AND ANAEROBIC Performed at [facility], [address] [place], [place] [address]    Special Requests   Final    Blood Culture adequate volume Performed at [facility], [address] [place], [place] [address]    Culture  Setup Time   Final    GRAM POSITIVE COCCI Gram Stain Report Called to,Read Back By and Verified With: KINDLEY,C @ 2020 ON 12/28/23 BY [facility] IN BOTH AEROBIC AND ANAEROBIC BOTTLES GS DONE @ [facility] CRITICAL RESULT CALLED TO, READ BACK BY AND VERIFIED WITHBETHA JAYSON LINGER RN 12/29/2023 @ 0010 BY AB Performed at [facility], [address], [place], [place] [address]    Culture STREPTOCOCCUS GROUP C (A)  Final   Report Status 12/30/2023 FINAL  Final   Organism ID, Bacteria STREPTOCOCCUS GROUP C  Final      Susceptibility   Streptococcus group c - MIC*    CLINDAMYCIN RESISTANT Resistant     AMPICILLIN <=0.25 SENSITIVE Sensitive     ERYTHROMYCIN >=8 RESISTANT Resistant     VANCOMYCIN  0.5 SENSITIVE Sensitive     CEFTRIAXONE  <=0.12 SENSITIVE Sensitive  LEVOFLOXACIN 0.5  SENSITIVE Sensitive     PENICILLIN  <=0.06 SENSITIVE Sensitive     * STREPTOCOCCUS GROUP C  Blood Culture ID Panel (Reflexed)     Status: Abnormal   Collection Time: 12/28/23 12:25 PM  Result Value Ref Range Status   Enterococcus faecalis NOT DETECTED NOT DETECTED Final   Enterococcus Faecium NOT DETECTED NOT DETECTED Final   Listeria monocytogenes NOT DETECTED NOT DETECTED Final   Staphylococcus species NOT DETECTED NOT DETECTED Final   Staphylococcus aureus (BCID) NOT DETECTED NOT DETECTED Final   Staphylococcus epidermidis NOT DETECTED NOT DETECTED Final   Staphylococcus lugdunensis NOT DETECTED NOT DETECTED Final   Streptococcus species DETECTED (A) NOT DETECTED Final    Comment: Not Enterococcus species, Streptococcus agalactiae, Streptococcus pyogenes, or Streptococcus pneumoniae. CRITICAL RESULT CALLED TO, READ BACK BY AND VERIFIED WITH: C KINDLEY RN 12/29/2023 @ 0010 BY AB    Streptococcus agalactiae NOT DETECTED NOT DETECTED Final   Streptococcus pneumoniae NOT DETECTED NOT DETECTED Final   Streptococcus pyogenes NOT DETECTED NOT DETECTED Final   A.calcoaceticus-baumannii NOT DETECTED NOT DETECTED Final   Bacteroides fragilis NOT DETECTED NOT DETECTED Final   Enterobacterales NOT DETECTED NOT DETECTED Final   Enterobacter cloacae complex NOT DETECTED NOT DETECTED Final   Escherichia coli NOT DETECTED NOT DETECTED Final   Klebsiella aerogenes NOT DETECTED NOT DETECTED Final   Klebsiella oxytoca NOT DETECTED NOT DETECTED Final   Klebsiella pneumoniae NOT DETECTED NOT DETECTED Final   Proteus species NOT DETECTED NOT DETECTED Final   Salmonella species NOT DETECTED NOT DETECTED Final   Serratia marcescens NOT DETECTED NOT DETECTED Final   Haemophilus influenzae NOT DETECTED NOT DETECTED Final   Neisseria meningitidis NOT DETECTED NOT DETECTED Final   Pseudomonas aeruginosa NOT DETECTED NOT DETECTED Final   Stenotrophomonas maltophilia NOT DETECTED NOT DETECTED Final   Candida  albicans NOT DETECTED NOT DETECTED Final   Candida auris NOT DETECTED NOT DETECTED Final   Candida glabrata NOT DETECTED NOT DETECTED Final   Candida krusei NOT DETECTED NOT DETECTED Final   Candida parapsilosis NOT DETECTED NOT DETECTED Final   Candida tropicalis NOT DETECTED NOT DETECTED Final   Cryptococcus neoformans/gattii NOT DETECTED NOT DETECTED Final    Comment: Performed at [facility], [address], [place], [place] [address]  MRSA Next Gen by PCR, Nasal     Status: Abnormal   Collection Time: 12/28/23  2:30 PM   Specimen: Nasal Mucosa; Nasal Swab  Result Value Ref Range Status   MRSA by PCR Next Gen DETECTED (A) NOT DETECTED Final    Comment: RESULT CALLED TO, READ BACK BY AND VERIFIED WITH: A SHELTON AT 1743 ON [id number] BY S DALTON (NOTE) The GeneXpert MRSA Assay (FDA approved for NASAL specimens only), is one component of a comprehensive MRSA colonization surveillance program. It is not intended to diagnose MRSA infection nor to guide or monitor treatment for MRSA infections. Test performance is not FDA approved in patients less than 24 years old. Performed at [facility], [address], [place], [place] [address]   Culture, blood (Routine X 2) w Reflex to ID Panel     Status: None (Preliminary result)   Collection Time: 12/29/23 11:31 PM   Specimen: BLOOD  Result Value Ref Range Status   Specimen Description BLOOD LEFT ANTECUBITAL  Final   Special Requests   Final    BOTTLES DRAWN AEROBIC AND ANAEROBIC Blood Culture adequate volume   Culture   Final    NO GROWTH  4 DAYS Performed at [facility], [address] [place], [place] [address]    Report Status PENDING  Incomplete  Culture, blood (Routine X 2) w Reflex to ID Panel     Status: None (Preliminary result)   Collection Time: 12/29/23 11:32 PM   Specimen: BLOOD LEFT HAND  Result Value Ref Range Status   Specimen Description BLOOD LEFT HAND  Final   Special Requests   Final    AEROBIC BOTTLE ONLY  Blood Culture results may not be optimal due to an inadequate volume of blood received in culture bottles   Culture   Final    NO GROWTH 4 DAYS Performed at [facility], [address], [place], [place] [address]    Report Status PENDING  Incomplete     Radiology Studies: DG CHEST PORT 1 VIEW Result Date: 12/31/2023 CLINICAL DATA:  [id number] Status post PICC central line placement [id number]. EXAM: PORTABLE CHEST 1 VIEW COMPARISON:  12/28/2023. FINDINGS: Bilateral lung fields are clear. Bilateral costophrenic angles are clear. Stable cardio-mediastinal silhouette. Prosthetic aortic valve noted. No acute osseous abnormalities. The soft tissues are within normal limits. Interval placement of right-sided PICC line with its tip overlying the cavoatrial junction region. No right pneumothorax. IMPRESSION: Interval placement of right-sided PICC line with its tip overlying the cavoatrial junction region. No pneumothorax. Electronically Signed   By: Ree Molt M.D.   On: 12/31/2023 16:18    Scheduled Meds:  apixaban   5 mg Oral BID   ascorbic acid   500 mg Oral BID   Chlorhexidine  Gluconate Cloth  6 each Topical Q0600   clotrimazole -betamethasone   1 Application Topical BID   diltiazem   240 mg Oral Daily   dutasteride   0.5 mg Oral Daily   feeding supplement  237 mL Oral BID BM   lactobacillus  1 g Oral TID WC   leptospermum manuka honey  1 Application Topical Daily   liver oil-zinc  oxide   Topical BID   melatonin  9 mg Oral QHS   metoprolol  tartrate  25 mg Oral BID   multivitamin with minerals  1 tablet Oral Daily   mupirocin  ointment   Nasal BID   oxybutynin   5 mg Oral QPM   sodium chloride  flush  3 mL Intravenous Q12H   zinc  sulfate (50mg  elemental zinc )  220 mg Oral Daily   Continuous Infusions:  penicillin  G potassium 24 Million Units in dextrose  5 % 500 mL CONTINUOUS infusion 20.8 mL/hr at 01/02/24 0022    LOS: 5 days   Time spent: 55 mins   Chianne Byrns [name], MD How to contact the [facility]  Attending or Consulting provider 7A - 7P or covering provider during after hours 7P -7A, for this patient?  Check the care team in [facility] and look for a) attending/consulting [facility] provider listed and b) the [facility] team listed Log into [email or web address] to find provider on call.  Locate the [facility] provider you are looking for under [facility] and page to a number that you can be directly reached. If you still have difficulty reaching the provider, please page the [facility] (Director on Call) for the Hospitalists listed on amion for assistance.  01/02/2024, 1:58 PM

## 2024-01-02 NOTE — Progress Notes (Signed)
 Physical Therapy Treatment Patient Details Name: Gary Carroll MRN: [id number] DOB: [birth year] Today's Date: 01/02/2024   History of Present Illness Gary Carroll is a 78 year old with extensive history of severe aortic stenosis s/p TAVR, paroxysmal A-fib on Eliquis , CAD, rectal adenocarcinoma, HTN, HLD, DM2, chronic heart failure HFpEF, presented to ED from nursing home with chief complaint of generalized weaknesses, fever, confusion.     Patient is poor historian at this point-electronic record review reveals patient has had multiple hospitalization for multiple comorbidities last discharged on 09/30/2023 after treatment for sepsis, UTI secondary to E. coli, Enterobacter, E. coli bacteremia COVID infection in April, E. coli sepsis.    PT Comments  Patient demonstrates slow labored movement for partially sitting up at bedside,  limited mostly due to c/o severe low back pain and lower legs, required HOB raised and Max assist and unable to attempt sit to stands due to patient requesting to lied dow due to pain. Patient will benefit from continued skilled physical therapy in hospital and recommended venue below to increase strength, balance, endurance for safe ADLs and gait.      If plan is discharge home, recommend the following: A lot of help with bathing/dressing/bathroom;A lot of help with walking and/or transfers;Help with stairs or ramp for entrance;Assistance with cooking/housework   Can travel by private vehicle     No  Equipment Recommendations  None recommended by PT    Recommendations for Other Services       Precautions / Restrictions Precautions Precautions: Fall Recall of Precautions/Restrictions: Intact Restrictions Weight Bearing Restrictions Per Provider Order: No     Mobility  Bed Mobility Overal bed mobility: Needs Assistance Bed Mobility: Supine to Sit, Sit to Supine     Supine to sit: Max assist Sit to supine: Max assist   General bed mobility  comments: Patient had poor tolerance for sitting up at bedside mostly due to c/o severe low back pain and pain in lower legs    Transfers                        Ambulation/Gait                   Stairs             Wheelchair Mobility     Tilt Bed    Modified Rankin (Stroke Patients Only)       Balance Overall balance assessment: Needs assistance Sitting-balance support: Feet supported, Bilateral upper extremity supported Sitting balance-Leahy Scale: Poor Sitting balance - Comments: seated at EOB Postural control: Posterior lean                                  Communication Communication Communication: No apparent difficulties  Cognition Arousal: Alert Behavior During Therapy: WFL for tasks assessed/performed                             Following commands: Intact      Cueing Cueing Techniques: Verbal cues, Tactile cues  Exercises      General Comments        Pertinent Vitals/Pain Pain Assessment Pain Assessment: Faces Faces Pain Scale: Hurts even more Pain Location: low back and BLE Pain Descriptors / Indicators: Grimacing, Sharp, Guarding Pain Intervention(s): Limited activity within patient's tolerance, Monitored during session, Repositioned    Home Living  Prior Function            PT Goals (current goals can now be found in the care plan section) Acute Rehab PT Goals Patient Stated Goal: return home PT Goal Formulation: With patient Time For Goal Achievement: 01/15/24 Potential to Achieve Goals: Fair Progress towards PT goals: Progressing toward goals    Frequency    Min 3X/week      PT Plan      Co-evaluation              AM-PAC PT 6 Clicks Mobility   Outcome Measure  Help needed turning from your back to your side while in a flat bed without using bedrails?: A Lot Help needed moving from lying on your back to sitting on the side of a  flat bed without using bedrails?: A Lot Help needed moving to and from a bed to a chair (including a wheelchair)?: Total Help needed standing up from a chair using your arms (e.g., wheelchair or bedside chair)?: Total Help needed to walk in hospital room?: Total Help needed climbing 3-5 steps with a railing? : Total 6 Click Score: 8    End of Session   Activity Tolerance: Patient limited by fatigue;Patient limited by pain Patient left: in bed;with call bell/phone within reach;with bed alarm set Nurse Communication: Mobility status PT Visit Diagnosis: Unsteadiness on feet (R26.81);Other abnormalities of gait and mobility (R26.89);Muscle weakness (generalized) (M62.81)     Time: 8941-8882 PT Time Calculation (min) (ACUTE ONLY): 19 min  Charges:    $Therapeutic Activity: 8-22 mins                       1:46 PM, 01/02/24 Lynwood Music, MPT Physical Therapist with [facility] [phone] office [phone] mobile phone

## 2024-01-03 LAB — BASIC METABOLIC PANEL WITH GFR
Anion gap: 4 — ABNORMAL LOW (ref 5–15)
BUN: 8 mg/dL (ref 8–23)
CO2: 25 mmol/L (ref 22–32)
Calcium: 8 mg/dL — ABNORMAL LOW (ref 8.9–10.3)
Chloride: 110 mmol/L (ref 98–111)
Creatinine, Ser: 0.45 mg/dL — ABNORMAL LOW (ref 0.61–1.24)
GFR, Estimated: >60 mL/min (ref >60)
Glucose, Bld: 106 mg/dL — ABNORMAL HIGH (ref 70–99)
Potassium: 3.7 mmol/L (ref 3.5–5.1)
Sodium: 139 mmol/L (ref 135–145)

## 2024-01-03 LAB — CULTURE, BLOOD (ROUTINE X 2)
Culture: NO GROWTH
Culture: NO GROWTH
Special Requests: ADEQUATE

## 2024-01-03 MED ORDER — SACCHAROMYCES BOULARDII 250 MG PO CAPS
250.0000 mg | ORAL_CAPSULE | Freq: Two times a day (BID) | ORAL | Status: DC
  Administered 2024-01-03 – 2024-01-06 (×7): 250 mg via ORAL
  Filled 2024-01-03 (×8): qty 1

## 2024-01-03 NOTE — Progress Notes (Signed)
 PROGRESS NOTE   Gary Carroll  FMW:[id number] DOB: 10-02-1945 DOA: 12/28/2023 PCP: [facility]   Chief Complaint  Patient presents with   Code Sepsis   Level of care: Telemetry  Brief Admission History:  Gary Carroll is a 78 year old with extensive history of severe aortic stenosis s/p TAVR, paroxysmal A-fib on Eliquis , CAD, rectal adenocarcinoma, HTN, HLD, DM2, chronic heart failure HFpEF, presented to ED from nursing home with chief complaint of generalized weaknesses, fever, confusion.  Patient is poor historian at this point-electronic record review reveals patient has had multiple hospitalization for multiple comorbidities last discharged on 09/30/2023 after treatment for sepsis, UTI secondary to E. coli, Enterobacter, E. coli bacteremia COVID infection in April, E. coli sepsis.  Labs: CMP potassium 5.3, BUN 28, creatinine 1.54, albumin 2.6, AST 63 ALT 47, lactic acid 5.0 CBC WBC 15.3, hemoglobin 11.9, neutrophil 15.0, INR 2.4, Respiratory panel-influenza A, B, COVID-all negative UA amber, moderate leukocytes, negative for nitrites, bacteria none, WBC > 50  Chest x-ray:  Patchy retrocardiac airspace opacities, likely atelectasis in the setting of low lung volumes. Alternatively, a developing bronchopneumonia could have this appearance in the correct clinical context.  Requested patient to be admitted, as patient meets sepsis criteria-likely source UTI with low probability of pneumonia  Sepsis protocol initiated, IV fluids, broad-spectrum antibiotics cultures been obtained   Assessment and Plan:  Group C Strep Sepsis - Sepsis physiology resolved  Group C Strep Bacteremia  -urinary source, UTI -Initiated sepsis protocol, aggressive IV fluid resuscitation, IV penicillin  per ID  -blood and urine cultures--Group C Strep positive infection -Lactic acidosis-  LA 5.0 trended down to 1.7 with treatments  -TEE planned for Monday 01/05/24   Adjustment disorder  with depressed mood - treating supportively  Type 2 diabetes mellitus, controlled -Holding home medication of metformin  -Checking CBG q. ACHS, SSI coverage -Check an A1c:  (last A1c 4 months ago 6.1) CBG (last 3)  Recent Labs    01/01/24 0729  GLUCAP 92   Disease of anus and rectum, unspecified - History of rectal adenocarcinoma with chronic fissure - Continue wound care - No signs of infection  Insomnia - Continue home medication melatonin - As needed trazodone   Morbid obesity Body mass index is 34.17 kg/m. 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 with unspecified severity  - Continue wound care per nursing  S/P TAVR (transcatheter aortic valve replacement) - Stable continue meds - discussed with ID and cardiology, planning for TEE on Mon 8/18  (HFpEF) heart failure with preserved ejection fraction  - POA euvolemic - Last Echo 12/ 2024 EF 60-65%, normal LV function moderate concentric LVH normal right ventricular systolic function, normal mitral and tricuspid valve repair replaced aortic valve - Will monitor I's and O's, daily weight  Intake/Output Summary (Last 24 hours) at 01/03/2024 1527 Last data filed at 01/03/2024 1300 Gross per 24 hour  Intake 963 ml  Output 3100 ml  Net -2137 ml   CAD (coronary artery disease) - Currently stable - Continue home medication including beta-blocker, statins, apixaban  calcium  channel  Aortic stenosis - History of severe aortic stenosis, s/p TAVR Successful TAVR with 26 mm Edwards SAPIEN 3 THV on 02/25/2023   Elevated transaminase level - Likely exacerbated by sepsis, on statins - Continue IV fluids, holding statins - Will avoid hepatotoxins  Benign prostatic hyperplasia with lower urinary tract symptoms Acute urinary retention -Temporary Foley catheter initially placed,  then was removed but needed to be replaced due to recurrent  urinary retention -Resuming home meds including oxybutynin  -plan is to continue foley cath for now and follow up with urologist outpatient for foley removal and voiding trial  AF (paroxysmal atrial fibrillation)  - Rate controlled with diltiazem , metoprolol , apixaban   Gastroesophageal reflux disease without esophagitis - Continue PPI  AKI (acute kidney injury) -- Resolved - Monitoring BUN/creatinine closely likely elevated due to sepsis, hypovolemia hypotension - Avoid nephrotoxins, hypotension - Continue IVF  Hyperlipidemia, unspecified - on statins -holding rosuvastatin  d/t transaminitis - Will monitor LFTs  Essential hypertension - Currently in sepsis, ruling out septic shock -Holding BP medications including losartan   Hypokalemia -- check Mg -- 1.6 -- repleted   Hypomagnesemia -- IV replacement ordered  -- repleted   DVT prophylaxis: apixaban  Code Status: Full  Family Communication:  Disposition: SNF placement early next week     Consultants:   Procedures:   Antimicrobials:    Subjective: Pt has been getting irritable at times, but says he slept better last night     Objective: Vitals:   01/03/24 0428 01/03/24 0600 01/03/24 0904 01/03/24 1302  BP: 124/78  (!) 149/67 (!) 106/54  Pulse: 98   61  Resp: 20   17  Temp: (!) 97.5 F (36.4 C)  98.9 F (37.2 C) 97.9 F (36.6 C)  TempSrc: Oral  Oral   SpO2: 98%   97%  Weight:  97.3 kg    Height:        Intake/Output Summary (Last 24 hours) at 01/03/2024 1527 Last data filed at 01/03/2024 1300 Gross per 24 hour  Intake 963 ml  Output 3100 ml  Net -2137 ml   Filed Weights   01/01/24 0400 01/02/24 0501 01/03/24 0600  Weight: 98.2 kg 99.5 kg 97.3 kg   Examination:  General exam: Appears calm and comfortable  Respiratory system: Clear to auscultation. Respiratory effort normal. Cardiovascular system: normal S1 & S2 heard. No JVD, murmurs, rubs, gallops or clicks. No pedal edema. Gastrointestinal system:  Abdomen is nondistended, soft and nontender. No organomegaly or masses felt. Normal bowel sounds heard. Central nervous system: Alert and oriented. No focal neurological deficits. Extremities: 2+ edema BLEs Symmetric 5 x 5 power. Skin: No rashes, lesions or ulcers. Psychiatry: Judgement and insight appear normal. Mood & affect appropriate.   Data Reviewed: I have personally reviewed following labs and imaging studies  CBC: Recent Labs  Lab 12/28/23 1225 12/28/23 2239 12/29/23 0433 12/30/23 0505 12/31/23 0228 01/01/24 0455 01/02/24 0455  WBC 15.3*   < > 17.7* 7.4 6.3 6.2 6.6  NEUTROABS 15.0*  --   --   --   --   --   --   HGB 11.9*   < > 9.3* 8.7* 8.6* 8.4* 8.4*  HCT 38.3*   < > 30.6* 27.7* 27.2* 26.7* 26.9*  MCV 95.3   < > 96.2 96.5 93.8 94.7 94.7  PLT 159   < > 110* 85* 77* 80* 92*   < > = values in this interval not displayed.    Basic Metabolic Panel: Recent Labs  Lab 12/28/23 1221 12/28/23 1225 12/30/23 0505 12/31/23 0228 12/31/23 1430 01/01/24 0455 01/02/24 0455 01/03/24 0408  NA  --    < > 136 138  --  136 137 139  K  --    < > 3.5 3.3*  --  3.6 3.7 3.7  CL  --    < > 106 109  --  108 110 110  CO2  --    < > 23 23  --  23 23 25   GLUCOSE  --    < > 84 97  --  96 93 106*  BUN  --    < > 19 16  --  13 8 8   CREATININE  --    < > 0.57* 0.49*  --  0.48* 0.49* 0.45*  CALCIUM   --    < > 8.1* 7.9*  --  7.6* 7.7* 8.0*  MG 2.0  --   --   --  1.6* 1.6* 1.9  --   PHOS 4.5  --   --   --   --   --   --   --    < > = values in this interval not displayed.    CBG: Recent Labs  Lab 12/28/23 1940 12/29/23 0738 12/30/23 0728 12/31/23 0727 01/01/24 0729  GLUCAP 90 106* 83 101* 92    Recent Results (from the past 240 hours)  Resp panel by RT-PCR (RSV, Flu A&B, Covid) Anterior Nasal Swab     Status: None   Collection Time: 12/28/23 11:52 AM   Specimen: Anterior Nasal Swab  Result Value Ref Range Status   SARS Coronavirus 2 by RT PCR NEGATIVE NEGATIVE Final     Comment: (NOTE) SARS-CoV-2 target nucleic acids are NOT DETECTED.  The SARS-CoV-2 RNA is generally detectable in upper respiratory specimens during the acute phase of infection. The lowest concentration of SARS-CoV-2 viral copies this assay can detect is 138 copies/mL. A negative result does not preclude SARS-Cov-2 infection and should not be used as the sole basis for treatment or other patient management decisions. A negative result may occur with  improper specimen collection/handling, submission of specimen other than nasopharyngeal swab, presence of viral mutation(s) within the areas targeted by this assay, and inadequate number of viral copies(<138 copies/mL). A negative result must be combined with clinical observations, patient history, and epidemiological information. The expected result is Negative.  Fact Sheet for Patients:  BloggerCourse.com  Fact Sheet for Healthcare Providers:  SeriousBroker.it  This test is no t yet approved or cleared by the [place]  FDA and  has been authorized for detection and/or diagnosis of SARS-CoV-2 by FDA under an Emergency Use Authorization (EUA). This EUA will remain  in effect (meaning this test can be used) for the duration of the COVID-19 declaration under Section 564(b)(1) of the Act, 21 U.S.C.section 360bbb-3(b)(1), unless the authorization is terminated  or revoked sooner.       Influenza A by PCR NEGATIVE NEGATIVE Final   Influenza B by PCR NEGATIVE NEGATIVE Final    Comment: (NOTE) The Xpert Xpress SARS-CoV-2/FLU/RSV plus assay is intended as an aid in the diagnosis of influenza from Nasopharyngeal swab specimens and should not be used as a sole basis for treatment. Nasal washings and aspirates are unacceptable for Xpert Xpress SARS-CoV-2/FLU/RSV testing.  Fact Sheet for Patients: BloggerCourse.com  Fact Sheet for Healthcare  Providers: SeriousBroker.it  This test is not yet approved or cleared by the [place]  FDA and has been authorized for detection and/or diagnosis of SARS-CoV-2 by FDA under an Emergency Use Authorization (EUA). This EUA will remain in effect (meaning this test can be used) for the duration of the COVID-19 declaration under Section 564(b)(1) of the Act, 21 U.S.C. section 360bbb-3(b)(1), unless the authorization is terminated or revoked.     Resp Syncytial Virus by PCR NEGATIVE NEGATIVE Final    Comment: (  NOTE) Fact Sheet for Patients: BloggerCourse.com  Fact Sheet for Healthcare Providers: SeriousBroker.it  This test is not yet approved or cleared by the [place]  FDA and has been authorized for detection and/or diagnosis of SARS-CoV-2 by FDA under an Emergency Use Authorization (EUA). This EUA will remain in effect (meaning this test can be used) for the duration of the COVID-19 declaration under Section 564(b)(1) of the Act, 21 U.S.C. section 360bbb-3(b)(1), unless the authorization is terminated or revoked.  Performed at [facility], [address], [place], [place] [address]   Urine Culture     Status: Abnormal   Collection Time: 12/28/23 11:53 AM   Specimen: Urine, Random  Result Value Ref Range Status   Specimen Description   Final    URINE, RANDOM Performed at [facility], [address], [place], [place] [address]    Special Requests   Final    NONE Reflexed from [id number] Performed at [facility], [address] [place], [place] [address]    Culture 40,000 COLONIES/mL YEAST (A)  Final   Report Status 12/31/2023 FINAL  Final  Blood Culture (routine x 2)     Status: Abnormal   Collection Time: 12/28/23 12:25 PM   Specimen: Right Antecubital; Blood  Result Value Ref Range Status   Specimen Description   Final    RIGHT ANTECUBITAL BOTTLES DRAWN AEROBIC AND ANAEROBIC Performed at [facility], [address] [place], [place] [address]    Special Requests   Final    Blood Culture adequate volume Performed at [facility], [address], [place], [place] [address]    Culture  Setup Time   Final    GRAM POSITIVE COCCI IN BOTH AEROBIC AND ANAEROBIC BOTTLES Gram Stain Report Called to,Read Back By and Verified With: C. KINDLEY ON 12/28/2023 @8 :20PM BY T.HAMER  Performed at [facility], [address] [place], [place] [address]    Culture (A)  Final    STREPTOCOCCUS GROUP C SUSCEPTIBILITIES PERFORMED ON PREVIOUS CULTURE WITHIN THE LAST 5 DAYS. Performed at [facility], [address] [place], [place] [address]    Report Status 12/30/2023 FINAL  Final  Blood Culture (routine x 2)     Status: Abnormal   Collection Time: 12/28/23 12:25 PM   Specimen: Left Antecubital; Blood  Result Value Ref Range Status   Specimen Description   Final    LEFT ANTECUBITAL BOTTLES DRAWN AEROBIC AND ANAEROBIC Performed at [facility], [address] [place], [place] [address]    Special Requests   Final    Blood Culture adequate volume Performed at [facility], [address] [place], [place] [address]    Culture  Setup Time   Final    GRAM POSITIVE COCCI Gram Stain Report Called to,Read Back By and Verified With: KINDLEY,C @ 2020 ON 12/28/23 BY [facility] IN BOTH AEROBIC AND ANAEROBIC BOTTLES GS DONE @ [facility] CRITICAL RESULT CALLED TO, READ BACK BY AND VERIFIED WITHBETHA JAYSON LINGER RN 12/29/2023 @ 0010 BY AB Performed at [facility], [address], [place], [place] [address]    Culture STREPTOCOCCUS GROUP C (A)  Final   Report Status 12/30/2023 FINAL  Final   Organism ID, Bacteria STREPTOCOCCUS GROUP C  Final      Susceptibility   Streptococcus group c - MIC*    CLINDAMYCIN RESISTANT Resistant     AMPICILLIN <=0.25 SENSITIVE Sensitive     ERYTHROMYCIN >=8 RESISTANT Resistant     VANCOMYCIN  0.5 SENSITIVE Sensitive  CEFTRIAXONE  <=0.12 SENSITIVE Sensitive     LEVOFLOXACIN 0.5  SENSITIVE Sensitive     PENICILLIN  <=0.06 SENSITIVE Sensitive     * STREPTOCOCCUS GROUP C  Blood Culture ID Panel (Reflexed)     Status: Abnormal   Collection Time: 12/28/23 12:25 PM  Result Value Ref Range Status   Enterococcus faecalis NOT DETECTED NOT DETECTED Final   Enterococcus Faecium NOT DETECTED NOT DETECTED Final   Listeria monocytogenes NOT DETECTED NOT DETECTED Final   Staphylococcus species NOT DETECTED NOT DETECTED Final   Staphylococcus aureus (BCID) NOT DETECTED NOT DETECTED Final   Staphylococcus epidermidis NOT DETECTED NOT DETECTED Final   Staphylococcus lugdunensis NOT DETECTED NOT DETECTED Final   Streptococcus species DETECTED (A) NOT DETECTED Final    Comment: Not Enterococcus species, Streptococcus agalactiae, Streptococcus pyogenes, or Streptococcus pneumoniae. CRITICAL RESULT CALLED TO, READ BACK BY AND VERIFIED WITH: C KINDLEY RN 12/29/2023 @ 0010 BY AB    Streptococcus agalactiae NOT DETECTED NOT DETECTED Final   Streptococcus pneumoniae NOT DETECTED NOT DETECTED Final   Streptococcus pyogenes NOT DETECTED NOT DETECTED Final   A.calcoaceticus-baumannii NOT DETECTED NOT DETECTED Final   Bacteroides fragilis NOT DETECTED NOT DETECTED Final   Enterobacterales NOT DETECTED NOT DETECTED Final   Enterobacter cloacae complex NOT DETECTED NOT DETECTED Final   Escherichia coli NOT DETECTED NOT DETECTED Final   Klebsiella aerogenes NOT DETECTED NOT DETECTED Final   Klebsiella oxytoca NOT DETECTED NOT DETECTED Final   Klebsiella pneumoniae NOT DETECTED NOT DETECTED Final   Proteus species NOT DETECTED NOT DETECTED Final   Salmonella species NOT DETECTED NOT DETECTED Final   Serratia marcescens NOT DETECTED NOT DETECTED Final   Haemophilus influenzae NOT DETECTED NOT DETECTED Final   Neisseria meningitidis NOT DETECTED NOT DETECTED Final   Pseudomonas aeruginosa NOT DETECTED NOT DETECTED Final   Stenotrophomonas maltophilia NOT DETECTED NOT DETECTED Final   Candida  albicans NOT DETECTED NOT DETECTED Final   Candida auris NOT DETECTED NOT DETECTED Final   Candida glabrata NOT DETECTED NOT DETECTED Final   Candida krusei NOT DETECTED NOT DETECTED Final   Candida parapsilosis NOT DETECTED NOT DETECTED Final   Candida tropicalis NOT DETECTED NOT DETECTED Final   Cryptococcus neoformans/gattii NOT DETECTED NOT DETECTED Final    Comment: Performed at [facility], [address], [place], [place] [address]  MRSA Next Gen by PCR, Nasal     Status: Abnormal   Collection Time: 12/28/23  2:30 PM   Specimen: Nasal Mucosa; Nasal Swab  Result Value Ref Range Status   MRSA by PCR Next Gen DETECTED (A) NOT DETECTED Final    Comment: RESULT CALLED TO, READ BACK BY AND VERIFIED WITH: A SHELTON AT 1743 ON [id number] BY S DALTON (NOTE) The GeneXpert MRSA Assay (FDA approved for NASAL specimens only), is one component of a comprehensive MRSA colonization surveillance program. It is not intended to diagnose MRSA infection nor to guide or monitor treatment for MRSA infections. Test performance is not FDA approved in patients less than 90 years old. Performed at [facility], [address] [place], [place] [address]   Culture, blood (Routine X 2) w Reflex to ID Panel     Status: None   Collection Time: 12/29/23 11:31 PM   Specimen: BLOOD  Result Value Ref Range Status   Specimen Description BLOOD LEFT ANTECUBITAL  Final   Special Requests   Final    BOTTLES DRAWN AEROBIC AND ANAEROBIC Blood Culture adequate volume   Culture  Final    NO GROWTH 5 DAYS Performed at [facility], [address] [place], [place] [address]    Report Status 01/03/2024 FINAL  Final  Culture, blood (Routine X 2) w Reflex to ID Panel     Status: None   Collection Time: 12/29/23 11:32 PM   Specimen: BLOOD LEFT HAND  Result Value Ref Range Status   Specimen Description BLOOD LEFT HAND  Final   Special Requests   Final    AEROBIC BOTTLE ONLY Blood Culture results may not be optimal  due to an inadequate volume of blood received in culture bottles   Culture   Final    NO GROWTH 5 DAYS Performed at [facility], [address], [place], [place] [address]    Report Status 01/03/2024 FINAL  Final     Radiology Studies: No results found.   Scheduled Meds:  apixaban   5 mg Oral BID   ascorbic acid   500 mg Oral BID   Chlorhexidine  Gluconate Cloth  6 each Topical Q0600   clotrimazole -betamethasone   1 Application Topical BID   diltiazem   240 mg Oral Daily   dutasteride   0.5 mg Oral Daily   feeding supplement  237 mL Oral BID BM   leptospermum manuka honey  1 Application Topical Daily   liver oil-zinc  oxide   Topical BID   melatonin  9 mg Oral QHS   metoprolol  tartrate  25 mg Oral BID   multivitamin with minerals  1 tablet Oral Daily   mupirocin  ointment   Nasal BID   oxybutynin   5 mg Oral QPM   saccharomyces boulardii  250 mg Oral BID   sodium chloride  flush  3 mL Intravenous Q12H   zinc  sulfate (50mg  elemental zinc )  220 mg Oral Daily   Continuous Infusions:  penicillin  G potassium 24 Million Units in dextrose  5 % 500 mL CONTINUOUS infusion 24 Million Units (01/02/24 1656)    LOS: 6 days   Time spent: 55 mins   Amia Rynders [name], MD How to contact the [facility] Attending or Consulting provider 7A - 7P or covering provider during after hours 7P -7A, for this patient?  Check the care team in [facility] and look for a) attending/consulting [facility] provider listed and b) the [facility] team listed Log into [email or web address] to find provider on call.  Locate the [facility] provider you are looking for under [facility] and page to a number that you can be directly reached. If you still have difficulty reaching the provider, please page the [facility] (Director on Call) for the Hospitalists listed on amion for assistance.  01/03/2024, 3:27 PM

## 2024-01-03 NOTE — Plan of Care (Signed)

## 2024-01-03 NOTE — Progress Notes (Signed)
 Patient refused to allow this writer to perform his wound care this am, however, pt is in a very grouchy/irritable mood and did not want this writer to perform wound care. Pt was not happy this am when CNA went into his room to give him a CHG bath, and would not let CNA change his bedpad. Pt allowed CNA to do the bare minimum for him and would not allow this writer do perfrom wound care. Pt begin yelling Do you know how early it is!!! They just done my wounds! You all do not let anyone around here get any sleep, now go on! This writer attempted to do patients dressing change more than once and patient refused. This Clinical research associate will report to oncoming shift as pt simply does not want his wound care done. Pts labs were drawn this am and given to the lab tech while the lab tech was on the unit. Pt slept during lab draw. It was only when this writer had to wake the patient up did he become angry and upset, and pt was already irritated and upset about CHG bath being given to him. Bed alarm on and call light within reach.

## 2024-01-03 NOTE — Progress Notes (Signed)
 Wound care completed to all wounds patient tolerated well/.

## 2024-01-03 NOTE — Plan of Care (Signed)
  Problem: Fluid Volume: Goal: Hemodynamic stability will improve 01/03/2024 0033 by Olene Corean CROME, RN Outcome: Progressing 01/03/2024 0032 by Olene Corean CROME, RN Outcome: Progressing   Problem: Clinical Measurements: Goal: Diagnostic test results will improve 01/03/2024 0033 by Olene Corean CROME, RN Outcome: Progressing 01/03/2024 0032 by Olene Corean CROME, RN Outcome: Progressing Goal: Signs and symptoms of infection will decrease 01/03/2024 0033 by Olene Corean CROME, RN Outcome: Progressing 01/03/2024 0032 by Olene Corean CROME, RN Outcome: Progressing   Problem: Respiratory: Goal: Ability to maintain adequate ventilation will improve 01/03/2024 0033 by Olene Corean CROME, RN Outcome: Progressing 01/03/2024 0032 by Olene Corean CROME, RN Outcome: Progressing   Problem: Education: Goal: Knowledge of General Education information will improve Description: Including pain rating scale, medication(s)/side effects and non-pharmacologic comfort measures 01/03/2024 0033 by Olene Corean CROME, RN Outcome: Progressing 01/03/2024 0032 by Olene Corean CROME, RN Outcome: Progressing   Problem: Health Behavior/Discharge Planning: Goal: Ability to manage health-related needs will improve 01/03/2024 0033 by Olene Corean CROME, RN Outcome: Progressing 01/03/2024 0032 by Olene Corean CROME, RN Outcome: Progressing   Problem: Clinical Measurements: Goal: Ability to maintain clinical measurements within normal limits will improve 01/03/2024 0033 by Olene Corean CROME, RN Outcome: Progressing 01/03/2024 0032 by Olene Corean CROME, RN Outcome: Progressing Goal: Will remain free from infection 01/03/2024 0033 by Olene Corean CROME, RN Outcome: Progressing 01/03/2024 0032 by Olene Corean CROME, RN Outcome: Progressing Goal: Diagnostic test results will improve 01/03/2024 0033 by Olene Corean CROME, RN Outcome:  Progressing 01/03/2024 0032 by Olene Corean CROME, RN Outcome: Progressing Goal: Respiratory complications will improve 01/03/2024 0033 by Olene Corean CROME, RN Outcome: Progressing 01/03/2024 0032 by Olene Corean CROME, RN Outcome: Progressing Goal: Cardiovascular complication will be avoided 01/03/2024 0033 by Olene Corean CROME, RN Outcome: Progressing 01/03/2024 0032 by Olene Corean CROME, RN Outcome: Progressing   Problem: Activity: Goal: Risk for activity intolerance will decrease 01/03/2024 0033 by Olene Corean CROME, RN Outcome: Progressing 01/03/2024 0032 by Olene Corean CROME, RN Outcome: Progressing   Problem: Nutrition: Goal: Adequate nutrition will be maintained 01/03/2024 0033 by Olene Corean CROME, RN Outcome: Progressing 01/03/2024 0032 by Olene Corean CROME, RN Outcome: Progressing   Problem: Coping: Goal: Level of anxiety will decrease 01/03/2024 0033 by Olene Corean CROME, RN Outcome: Progressing 01/03/2024 0032 by Olene Corean CROME, RN Outcome: Progressing   Problem: Elimination: Goal: Will not experience complications related to bowel motility 01/03/2024 0033 by Olene Corean CROME, RN Outcome: Progressing 01/03/2024 0032 by Olene Corean CROME, RN Outcome: Progressing Goal: Will not experience complications related to urinary retention 01/03/2024 0033 by Olene Corean CROME, RN Outcome: Progressing 01/03/2024 0032 by Olene Corean CROME, RN Outcome: Progressing   Problem: Pain Managment: Goal: General experience of comfort will improve and/or be controlled 01/03/2024 0033 by Olene Corean CROME, RN Outcome: Progressing 01/03/2024 0032 by Olene Corean CROME, RN Outcome: Progressing   Problem: Safety: Goal: Ability to remain free from injury will improve 01/03/2024 0033 by Olene Corean CROME, RN Outcome: Progressing 01/03/2024 0032 by Olene Corean CROME, RN Outcome: Progressing    Problem: Skin Integrity: Goal: Risk for impaired skin integrity will decrease 01/03/2024 0033 by Olene Corean CROME, RN Outcome: Progressing 01/03/2024 0032 by Olene Corean CROME, RN Outcome: Progressing

## 2024-01-03 NOTE — Plan of Care (Signed)
  Problem: Fluid Volume: Goal: Hemodynamic stability will improve Outcome: Progressing   Problem: Clinical Measurements: Goal: Signs and symptoms of infection will decrease Outcome: Progressing

## 2024-01-04 LAB — VITAMIN A: Vitamin A (Retinoic Acid): 14.4 ug/dL — ABNORMAL LOW (ref 22.0–69.5)

## 2024-01-04 NOTE — Plan of Care (Signed)

## 2024-01-04 NOTE — Plan of Care (Signed)
  Problem: Health Behavior/Discharge Planning: Goal: Ability to manage health-related needs will improve Outcome: Progressing Refusing care at times, more cooperative after family visited.

## 2024-01-04 NOTE — Progress Notes (Signed)
 Patient refused to allow this writer to change his dressings this am. Pt stated Your not changing my dressings this early in the morning. Have you lost your mind. Just get out of my room now! Pt became very agitated. Other staff witnessed pt becoming upset when this writer simply asked if this writer could perform his wound care. Previous nurse on last shift reported to this writer that the patient had stated to her Your not changing my dressings at no 0500 in the morning! And that nurse had to tell him it was 1700 in the evening and pt agreed. Pt refused yesterday am to have dressing changes done as well and is asking if his dressings cannot be changed during the daytime in the evening as he does not want to be woken up for wound care early in the mornings and he will not have any part of this. Pt also called this Clinical research associate stupid when this Clinical research associate was exiting patients room. Pt has call light within reach and bed alarm is on. If this Clinical research associate finds that pt wakes up prior to this writers shift ending, this Clinical research associate will again attempt wound care.

## 2024-01-04 NOTE — Progress Notes (Incomplete)
 CNA reported that

## 2024-01-04 NOTE — Progress Notes (Signed)
 PROGRESS NOTE  Gary Carroll  FMW:[id number] DOB: [birth year] DOA: 12/28/2023 PCP: [facility]   Chief Complaint  Patient presents with   Code Sepsis   Level of care: Telemetry  Brief Admission History:  Gary Carroll is a 78 year old with extensive history of severe aortic stenosis s/p TAVR, paroxysmal A-fib on Eliquis , CAD, rectal adenocarcinoma, HTN, HLD, DM2, chronic heart failure HFpEF, presented to ED from nursing home with chief complaint of generalized weaknesses, fever, confusion.  Patient is poor historian at this point-electronic record review reveals patient has had multiple hospitalization for multiple comorbidities last discharged on 09/30/2023 after treatment for sepsis, UTI secondary to E. coli, Enterobacter, E. coli bacteremia COVID infection in April, E. coli sepsis.  Labs: CMP potassium 5.3, BUN 28, creatinine 1.54, albumin 2.6, AST 63 ALT 47, lactic acid 5.0 CBC WBC 15.3, hemoglobin 11.9, neutrophil 15.0, INR 2.4, Respiratory panel-influenza A, B, COVID-all negative UA amber, moderate leukocytes, negative for nitrites, bacteria none, WBC > 50  Chest x-ray:  Patchy retrocardiac airspace opacities, likely atelectasis in the setting of low lung volumes. Alternatively, a developing bronchopneumonia could have this appearance in the correct clinical context.  Requested patient to be admitted, as patient meets sepsis criteria-likely source UTI with low probability of pneumonia  Sepsis protocol initiated, IV fluids, broad-spectrum antibiotics cultures been obtained   Assessment and Plan:  Group C Strep Sepsis - Sepsis physiology resolved  Group C Strep Bacteremia  -urinary source, UTI -Initiated sepsis protocol, aggressive IV fluid resuscitation, IV penicillin  per ID  -blood and urine cultures--Group C Strep positive infection -Lactic acidosis-  LA 5.0 trended down to 1.7 with treatments  -TEE planned for Monday 01/05/24   Adjustment disorder with  depressed mood - treating supportively  Type 2 diabetes mellitus, controlled -Holding home medication of metformin  -Checking CBG q. ACHS, SSI coverage -Check an A1c:  (last A1c 4 months ago 6.1)  Disease of anus and rectum, unspecified - History of rectal adenocarcinoma with chronic fissure - Continue wound care - No signs of infection  Insomnia - Continue home medication melatonin - As needed trazodone   Morbid obesity Body mass index is 34.17 kg/m. 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 with unspecified severity  - Continue wound care per nursing  S/P TAVR (transcatheter aortic valve replacement) - Stable continue meds - discussed with ID and cardiology, planning for TEE on Mon 8/18  (HFpEF) heart failure with preserved ejection fraction  - POA euvolemic - Last Echo 12/ 2024 EF 60-65%, normal LV function moderate concentric LVH normal right ventricular systolic function, normal mitral and tricuspid valve repair replaced aortic valve - Will monitor I's and O's, daily weight  Intake/Output Summary (Last 24 hours) at 01/04/2024 1422 Last data filed at 01/04/2024 1136 Gross per 24 hour  Intake 387 ml  Output 1350 ml  Net -963 ml   CAD (coronary artery disease) - Currently stable - Continue home medication including beta-blocker, statins, apixaban  calcium  channel  Aortic stenosis - History of severe aortic stenosis, s/p TAVR Successful TAVR with 26 mm Edwards SAPIEN 3 THV on 02/25/2023   Elevated transaminase level - Likely exacerbated by sepsis, on statins - Continue IV fluids, holding statins - Will avoid hepatotoxins  Benign prostatic hyperplasia with lower urinary tract symptoms Acute urinary retention -Temporary Foley catheter initially placed, then was removed but needed to be replaced due to recurrent urinary retention -Resuming home meds  including  oxybutynin  -plan is to continue foley cath for now and follow up with urologist outpatient for foley removal and voiding trial  AF (paroxysmal atrial fibrillation)  - Rate controlled with diltiazem , metoprolol , apixaban   Gastroesophageal reflux disease without esophagitis - Continue PPI  AKI (acute kidney injury) -- Resolved - Monitoring BUN/creatinine closely likely elevated due to sepsis, hypovolemia hypotension - Avoid nephrotoxins, hypotension - Continue IVF  Hyperlipidemia, unspecified - on statins -holding rosuvastatin  d/t transaminitis - Will monitor LFTs  Essential hypertension - Currently in sepsis, ruling out septic shock -Holding BP medications including losartan   Hypokalemia -- check Mg -- 1.6 -- repleted   Hypomagnesemia -- IV replacement ordered  -- repleted   DVT prophylaxis: apixaban  Code Status: Full  Family Communication:  Disposition: SNF placement early next week     Consultants:   Procedures:   Antimicrobials:    Subjective: Pt agreeable to having TEE tomorrow.      Objective: Vitals:   01/03/24 2101 01/04/24 0500 01/04/24 0529 01/04/24 1311  BP: 122/60  122/73 114/66  Pulse: 88  71 69  Resp:      Temp:   97.7 F (36.5 C) 98.3 F (36.8 C)  TempSrc:   Oral Oral  SpO2:   97% 99%  Weight:  92.6 kg    Height:        Intake/Output Summary (Last 24 hours) at 01/04/2024 1422 Last data filed at 01/04/2024 1136 Gross per 24 hour  Intake 387 ml  Output 1350 ml  Net -963 ml   Filed Weights   01/02/24 0501 01/03/24 0600 01/04/24 0500  Weight: 99.5 kg 97.3 kg 92.6 kg   Examination:  General exam: Appears calm and comfortable  Respiratory system: Clear to auscultation. Respiratory effort normal. Cardiovascular system: normal S1 & S2 heard. No JVD, murmurs, rubs, gallops or clicks. No pedal edema. Gastrointestinal system: Abdomen is nondistended, soft and nontender. No organomegaly or masses felt. Normal bowel sounds heard. Central  nervous system: Alert and oriented. No focal neurological deficits. Extremities: 1-2+ edema BLEs Symmetric 5 x 5 power. Skin: No rashes, lesions or ulcers. Psychiatry: Judgement and insight appear normal. Mood & affect appropriate.   Data Reviewed: I have personally reviewed following labs and imaging studies  CBC: Recent Labs  Lab 12/29/23 0433 12/30/23 0505 12/31/23 0228 01/01/24 0455 01/02/24 0455  WBC 17.7* 7.4 6.3 6.2 6.6  HGB 9.3* 8.7* 8.6* 8.4* 8.4*  HCT 30.6* 27.7* 27.2* 26.7* 26.9*  MCV 96.2 96.5 93.8 94.7 94.7  PLT 110* 85* 77* 80* 92*    Basic Metabolic Panel: Recent Labs  Lab 12/30/23 0505 12/31/23 0228 12/31/23 1430 01/01/24 0455 01/02/24 0455 01/03/24 0408  NA 136 138  --  136 137 139  K 3.5 3.3*  --  3.6 3.7 3.7  CL 106 109  --  108 110 110  CO2 23 23  --  23 23 25   GLUCOSE 84 97  --  96 93 106*  BUN 19 16  --  13 8 8   CREATININE 0.57* 0.49*  --  0.48* 0.49* 0.45*  CALCIUM  8.1* 7.9*  --  7.6* 7.7* 8.0*  MG  --   --  1.6* 1.6* 1.9  --     CBG: Recent Labs  Lab 12/28/23 1940 12/29/23 0738 12/30/23 0728 12/31/23 0727 01/01/24 0729  GLUCAP 90 106* 83 101* 92    Recent Results (from the past 240 hours)  Resp panel by RT-PCR (RSV, Flu A&B, Covid) Anterior Nasal Swab  Status: None   Collection Time: 12/28/23 11:52 AM   Specimen: Anterior Nasal Swab  Result Value Ref Range Status   SARS Coronavirus 2 by RT PCR NEGATIVE NEGATIVE Final    Comment: (NOTE) SARS-CoV-2 target nucleic acids are NOT DETECTED.  The SARS-CoV-2 RNA is generally detectable in upper respiratory specimens during the acute phase of infection. The lowest concentration of SARS-CoV-2 viral copies this assay can detect is 138 copies/mL. A negative result does not preclude SARS-Cov-2 infection and should not be used as the sole basis for treatment or other patient management decisions. A negative result may occur with  improper specimen collection/handling, submission of  specimen other than nasopharyngeal swab, presence of viral mutation(s) within the areas targeted by this assay, and inadequate number of viral copies(<138 copies/mL). A negative result must be combined with clinical observations, patient history, and epidemiological information. The expected result is Negative.  Fact Sheet for Patients:  BloggerCourse.com  Fact Sheet for Healthcare Providers:  SeriousBroker.it  This test is no t yet approved or cleared by the [place]  FDA and  has been authorized for detection and/or diagnosis of SARS-CoV-2 by FDA under an Emergency Use Authorization (EUA). This EUA will remain  in effect (meaning this test can be used) for the duration of the COVID-19 declaration under Section 564(b)(1) of the Act, 21 U.S.C.section 360bbb-3(b)(1), unless the authorization is terminated  or revoked sooner.       Influenza A by PCR NEGATIVE NEGATIVE Final   Influenza B by PCR NEGATIVE NEGATIVE Final    Comment: (NOTE) The Xpert Xpress SARS-CoV-2/FLU/RSV plus assay is intended as an aid in the diagnosis of influenza from Nasopharyngeal swab specimens and should not be used as a sole basis for treatment. Nasal washings and aspirates are unacceptable for Xpert Xpress SARS-CoV-2/FLU/RSV testing.  Fact Sheet for Patients: BloggerCourse.com  Fact Sheet for Healthcare Providers: SeriousBroker.it  This test is not yet approved or cleared by the [place]  FDA and has been authorized for detection and/or diagnosis of SARS-CoV-2 by FDA under an Emergency Use Authorization (EUA). This EUA will remain in effect (meaning this test can be used) for the duration of the COVID-19 declaration under Section 564(b)(1) of the Act, 21 U.S.C. section 360bbb-3(b)(1), unless the authorization is terminated or revoked.     Resp Syncytial Virus by PCR NEGATIVE NEGATIVE Final     Comment: (NOTE) Fact Sheet for Patients: BloggerCourse.com  Fact Sheet for Healthcare Providers: SeriousBroker.it  This test is not yet approved or cleared by the [place]  FDA and has been authorized for detection and/or diagnosis of SARS-CoV-2 by FDA under an Emergency Use Authorization (EUA). This EUA will remain in effect (meaning this test can be used) for the duration of the COVID-19 declaration under Section 564(b)(1) of the Act, 21 U.S.C. section 360bbb-3(b)(1), unless the authorization is terminated or revoked.  Performed at [facility], [address] [place], [place] [address]   Urine Culture     Status: Abnormal   Collection Time: 12/28/23 11:53 AM   Specimen: Urine, Random  Result Value Ref Range Status   Specimen Description   Final    URINE, RANDOM Performed at [facility], [address], [place], [place] [address]    Special Requests   Final    NONE Reflexed from [id number] Performed at [facility], [address], [place], [place] [address]    Culture 40,000 COLONIES/mL YEAST (A)  Final   Report Status 12/31/2023 FINAL  Final  Blood Culture (routine  x 2)     Status: Abnormal   Collection Time: 12/28/23 12:25 PM   Specimen: Right Antecubital; Blood  Result Value Ref Range Status   Specimen Description   Final    RIGHT ANTECUBITAL BOTTLES DRAWN AEROBIC AND ANAEROBIC Performed at [facility], [address], [place], [place] [address]    Special Requests   Final    Blood Culture adequate volume Performed at [facility], [address] [place], [place] [address]    Culture  Setup Time   Final    GRAM POSITIVE COCCI IN BOTH AEROBIC AND ANAEROBIC BOTTLES Gram Stain Report Called to,Read Back By and Verified With: C. KINDLEY ON 12/28/2023 @8 :20PM BY T.HAMER  Performed at [facility], [address] [place], [place] [address]    Culture (A)  Final    STREPTOCOCCUS GROUP C SUSCEPTIBILITIES PERFORMED ON  PREVIOUS CULTURE WITHIN THE LAST 5 DAYS. Performed at [facility], [address], [place], [place] [address]    Report Status 12/30/2023 FINAL  Final  Blood Culture (routine x 2)     Status: Abnormal   Collection Time: 12/28/23 12:25 PM   Specimen: Left Antecubital; Blood  Result Value Ref Range Status   Specimen Description   Final    LEFT ANTECUBITAL BOTTLES DRAWN AEROBIC AND ANAEROBIC Performed at [facility], [address], [place], [place] [address]    Special Requests   Final    Blood Culture adequate volume Performed at [facility], [address], [place], [place] [address]    Culture  Setup Time   Final    GRAM POSITIVE COCCI Gram Stain Report Called to,Read Back By and Verified With: KINDLEY,C @ 2020 ON 12/28/23 BY [facility] IN BOTH AEROBIC AND ANAEROBIC BOTTLES GS DONE @ [facility] CRITICAL RESULT CALLED TO, READ BACK BY AND VERIFIED WITHBETHA JAYSON LINGER RN 12/29/2023 @ 0010 BY AB Performed at [facility], [address] [place], [place] [address]    Culture STREPTOCOCCUS GROUP C (A)  Final   Report Status 12/30/2023 FINAL  Final   Organism ID, Bacteria STREPTOCOCCUS GROUP C  Final      Susceptibility   Streptococcus group c - MIC*    CLINDAMYCIN RESISTANT Resistant     AMPICILLIN <=0.25 SENSITIVE Sensitive     ERYTHROMYCIN >=8 RESISTANT Resistant     VANCOMYCIN  0.5 SENSITIVE Sensitive     CEFTRIAXONE  <=0.12 SENSITIVE Sensitive     LEVOFLOXACIN 0.5 SENSITIVE Sensitive     PENICILLIN  <=0.06 SENSITIVE Sensitive     * STREPTOCOCCUS GROUP C  Blood Culture ID Panel (Reflexed)     Status: Abnormal   Collection Time: 12/28/23 12:25 PM  Result Value Ref Range Status   Enterococcus faecalis NOT DETECTED NOT DETECTED Final   Enterococcus Faecium NOT DETECTED NOT DETECTED Final   Listeria monocytogenes NOT DETECTED NOT DETECTED Final   Staphylococcus species NOT DETECTED NOT DETECTED Final   Staphylococcus aureus (BCID) NOT DETECTED NOT DETECTED Final   Staphylococcus  epidermidis NOT DETECTED NOT DETECTED Final   Staphylococcus lugdunensis NOT DETECTED NOT DETECTED Final   Streptococcus species DETECTED (A) NOT DETECTED Final    Comment: Not Enterococcus species, Streptococcus agalactiae, Streptococcus pyogenes, or Streptococcus pneumoniae. CRITICAL RESULT CALLED TO, READ BACK BY AND VERIFIED WITH: C KINDLEY RN 12/29/2023 @ 0010 BY AB    Streptococcus agalactiae NOT DETECTED NOT DETECTED Final   Streptococcus pneumoniae NOT DETECTED NOT DETECTED Final   Streptococcus pyogenes NOT DETECTED NOT DETECTED Final  A.calcoaceticus-baumannii NOT DETECTED NOT DETECTED Final   Bacteroides fragilis NOT DETECTED NOT DETECTED Final   Enterobacterales NOT DETECTED NOT DETECTED Final   Enterobacter cloacae complex NOT DETECTED NOT DETECTED Final   Escherichia coli NOT DETECTED NOT DETECTED Final   Klebsiella aerogenes NOT DETECTED NOT DETECTED Final   Klebsiella oxytoca NOT DETECTED NOT DETECTED Final   Klebsiella pneumoniae NOT DETECTED NOT DETECTED Final   Proteus species NOT DETECTED NOT DETECTED Final   Salmonella species NOT DETECTED NOT DETECTED Final   Serratia marcescens NOT DETECTED NOT DETECTED Final   Haemophilus influenzae NOT DETECTED NOT DETECTED Final   Neisseria meningitidis NOT DETECTED NOT DETECTED Final   Pseudomonas aeruginosa NOT DETECTED NOT DETECTED Final   Stenotrophomonas maltophilia NOT DETECTED NOT DETECTED Final   Candida albicans NOT DETECTED NOT DETECTED Final   Candida auris NOT DETECTED NOT DETECTED Final   Candida glabrata NOT DETECTED NOT DETECTED Final   Candida krusei NOT DETECTED NOT DETECTED Final   Candida parapsilosis NOT DETECTED NOT DETECTED Final   Candida tropicalis NOT DETECTED NOT DETECTED Final   Cryptococcus neoformans/gattii NOT DETECTED NOT DETECTED Final    Comment: Performed at [facility], [address] [place], [place] [address]  MRSA Next Gen by PCR, Nasal     Status: Abnormal   Collection Time:  12/28/23  2:30 PM   Specimen: Nasal Mucosa; Nasal Swab  Result Value Ref Range Status   MRSA by PCR Next Gen DETECTED (A) NOT DETECTED Final    Comment: RESULT CALLED TO, READ BACK BY AND VERIFIED WITH: A SHELTON AT 1743 ON [id number] BY S DALTON (NOTE) The GeneXpert MRSA Assay (FDA approved for NASAL specimens only), is one component of a comprehensive MRSA colonization surveillance program. It is not intended to diagnose MRSA infection nor to guide or monitor treatment for MRSA infections. Test performance is not FDA approved in patients less than 33 years old. Performed at [facility], [address] [place], [place] [address]   Culture, blood (Routine X 2) w Reflex to ID Panel     Status: None   Collection Time: 12/29/23 11:31 PM   Specimen: BLOOD  Result Value Ref Range Status   Specimen Description BLOOD LEFT ANTECUBITAL  Final   Special Requests   Final    BOTTLES DRAWN AEROBIC AND ANAEROBIC Blood Culture adequate volume   Culture   Final    NO GROWTH 5 DAYS Performed at [facility], [address], [place], [place] [address]    Report Status 01/03/2024 FINAL  Final  Culture, blood (Routine X 2) w Reflex to ID Panel     Status: None   Collection Time: 12/29/23 11:32 PM   Specimen: BLOOD LEFT HAND  Result Value Ref Range Status   Specimen Description BLOOD LEFT HAND  Final   Special Requests   Final    AEROBIC BOTTLE ONLY Blood Culture results may not be optimal due to an inadequate volume of blood received in culture bottles   Culture   Final    NO GROWTH 5 DAYS Performed at [facility], [address], [place], [place] [address]    Report Status 01/03/2024 FINAL  Final     Radiology Studies: No results found.   Scheduled Meds:  apixaban   5 mg Oral BID   ascorbic acid   500 mg Oral BID   Chlorhexidine  Gluconate Cloth  6 each Topical Q0600   clotrimazole -betamethasone   1 Application Topical BID   diltiazem   240  mg Oral Daily   dutasteride   0.5 mg Oral Daily    feeding supplement  237 mL Oral BID BM   leptospermum manuka honey  1 Application Topical Daily   liver oil-zinc  oxide   Topical BID   melatonin  9 mg Oral QHS   metoprolol  tartrate  25 mg Oral BID   multivitamin with minerals  1 tablet Oral Daily   mupirocin  ointment   Nasal BID   oxybutynin   5 mg Oral QPM   saccharomyces boulardii  250 mg Oral BID   sodium chloride  flush  3 mL Intravenous Q12H   zinc  sulfate (50mg  elemental zinc )  220 mg Oral Daily   Continuous Infusions:  penicillin  G potassium 24 Million Units in dextrose  5 % 500 mL CONTINUOUS infusion 24 Million Units (01/03/24 1720)    LOS: 7 days   Time spent: 55 mins   Keeshia Sanderlin [name], MD How to contact the [facility] Attending or Consulting provider 7A - 7P or covering provider during after hours 7P -7A, for this patient?  Check the care team in [facility] and look for a) attending/consulting TRH provider listed and b) the TRH team listed Log into [email or web address] to find provider on call.  Locate the TRH provider you are looking for under [facility] and page to a number that you can be directly reached. If you still have difficulty reaching the provider, please page the [facility] (Director on Call) for the Hospitalists listed on amion for assistance.  01/04/2024, 2:22 PM

## 2024-01-05 ENCOUNTER — Other Ambulatory Visit: Payer: Self-pay

## 2024-01-05 ENCOUNTER — Encounter: Payer: Self-pay | Admitting: *Deleted

## 2024-01-05 ENCOUNTER — Encounter (HOSPITAL_COMMUNITY)
Admission: EM | Disposition: A | Discharge status: Skilled Nursing Facility | Payer: Self-pay | Source: Skilled Nursing Facility | Attending: Family Medicine

## 2024-01-05 ENCOUNTER — Inpatient Hospital Stay (HOSPITAL_COMMUNITY): Admitting: Anesthesiology

## 2024-01-05 ENCOUNTER — Encounter (HOSPITAL_COMMUNITY): Payer: Self-pay | Admitting: Family Medicine

## 2024-01-05 ENCOUNTER — Inpatient Hospital Stay (HOSPITAL_COMMUNITY)

## 2024-01-05 ENCOUNTER — Other Ambulatory Visit (HOSPITAL_COMMUNITY): Payer: Self-pay | Admitting: *Deleted

## 2024-01-05 DIAGNOSIS — B955 Unspecified streptococcus as the cause of diseases classified elsewhere: Secondary | ICD-10-CM

## 2024-01-05 DIAGNOSIS — I361 Nonrheumatic tricuspid (valve) insufficiency: Secondary | ICD-10-CM | POA: Diagnosis not present

## 2024-01-05 DIAGNOSIS — Z87891 Personal history of nicotine dependence: Secondary | ICD-10-CM

## 2024-01-05 DIAGNOSIS — R7881 Bacteremia: Secondary | ICD-10-CM

## 2024-01-05 DIAGNOSIS — I2510 Atherosclerotic heart disease of native coronary artery without angina pectoris: Secondary | ICD-10-CM

## 2024-01-05 DIAGNOSIS — I10 Essential (primary) hypertension: Secondary | ICD-10-CM

## 2024-01-05 HISTORY — PX: TEE WITHOUT CARDIOVERSION: SHX5443

## 2024-01-05 LAB — CBC
HCT: 28.8 % — ABNORMAL LOW (ref 39.0–52.0)
Hemoglobin: 8.9 g/dL — ABNORMAL LOW (ref 13.0–17.0)
MCH: 29.2 pg (ref 26.0–34.0)
MCHC: 30.9 g/dL (ref 30.0–36.0)
MCV: 94.4 fL (ref 80.0–100.0)
Platelets: 165 K/uL (ref 150–400)
RBC: 3.05 MIL/uL — ABNORMAL LOW (ref 4.22–5.81)
RDW: 17.2 % — ABNORMAL HIGH (ref 11.5–15.5)
WBC: 5.6 K/uL (ref 4.0–10.5)
nRBC: 0 % (ref 0.0–0.2)

## 2024-01-05 LAB — BASIC METABOLIC PANEL WITH GFR
Anion gap: 7 (ref 5–15)
BUN: 9 mg/dL (ref 8–23)
CO2: 25 mmol/L (ref 22–32)
Calcium: 8.2 mg/dL — ABNORMAL LOW (ref 8.9–10.3)
Chloride: 104 mmol/L (ref 98–111)
Creatinine, Ser: 0.48 mg/dL — ABNORMAL LOW (ref 0.61–1.24)
GFR, Estimated: >60 mL/min (ref >60)
Glucose, Bld: 99 mg/dL (ref 70–99)
Potassium: 4 mmol/L (ref 3.5–5.1)
Sodium: 136 mmol/L (ref 135–145)

## 2024-01-05 LAB — GLUCOSE, CAPILLARY
Glucose-Capillary: 105 mg/dL — ABNORMAL HIGH (ref 70–99)
Glucose-Capillary: 98 mg/dL (ref 70–99)

## 2024-01-05 LAB — ECHO TEE

## 2024-01-05 SURGERY — ECHOCARDIOGRAM, TRANSESOPHAGEAL
Anesthesia: General

## 2024-01-05 MED ORDER — PROPOFOL 500 MG/50ML IV EMUL
INTRAVENOUS | Status: DC | PRN
  Administered 2024-01-05: 50 ug/kg/min via INTRAVENOUS
  Administered 2024-01-05 (×2): 50 mg via INTRAVENOUS

## 2024-01-05 MED ORDER — LIDOCAINE 2% (20 MG/ML) 5 ML SYRINGE
INTRAMUSCULAR | Status: DC | PRN
  Administered 2024-01-05: 50 mg via INTRAVENOUS

## 2024-01-05 MED ORDER — BUTAMBEN-TETRACAINE-BENZOCAINE 2-2-14 % EX AERO
INHALATION_SPRAY | CUTANEOUS | Status: AC
  Filled 2024-01-05: qty 5

## 2024-01-05 MED ORDER — LACTATED RINGERS IV SOLN: INTRAVENOUS | Status: DC | PRN

## 2024-01-05 MED ORDER — PROPOFOL 10 MG/ML IV BOLUS
INTRAVENOUS | Status: AC
  Filled 2024-01-05: qty 20

## 2024-01-05 MED ORDER — PROPOFOL 500 MG/50ML IV EMUL
INTRAVENOUS | Status: AC
  Filled 2024-01-05: qty 50

## 2024-01-05 MED ORDER — BUTAMBEN-TETRACAINE-BENZOCAINE 2-2-14 % EX AERO
INHALATION_SPRAY | CUTANEOUS | Status: DC | PRN
  Administered 2024-01-05: 2 via TOPICAL

## 2024-01-05 MED ORDER — LIDOCAINE 2% (20 MG/ML) 5 ML SYRINGE
INTRAMUSCULAR | Status: AC
  Filled 2024-01-05: qty 5

## 2024-01-05 NOTE — Anesthesia Postprocedure Evaluation (Signed)
 Anesthesia Post Note  Patient: Gary Carroll  Procedure(s) Performed: ECHOCARDIOGRAM, TRANSESOPHAGEAL  Patient location during evaluation: PACU Anesthesia Type: General Level of consciousness: awake and alert Pain management: pain level controlled Vital Signs Assessment: post-procedure vital signs reviewed and stable Respiratory status: spontaneous breathing, nonlabored ventilation, respiratory function stable and patient connected to nasal cannula oxygen  Cardiovascular status: blood pressure returned to baseline and stable Postop Assessment: no apparent nausea or vomiting Anesthetic complications: no   There were no known notable events for this encounter.   Last Vitals:  Vitals:   01/05/24 1423 01/05/24 1430  BP: 95/63   Pulse: 75 70  Resp: 19 19  Temp:  36.9 C  SpO2: 99% 99%    Last Pain:  Vitals:   01/05/24 1415  TempSrc:   PainSc: 0-No pain                 Kaleigh Spiegelman L Haani Bakula

## 2024-01-05 NOTE — Transfer of Care (Signed)
 Immediate Anesthesia Transfer of Care Note  Patient: Gary Carroll  Procedure(s) Performed: ECHOCARDIOGRAM, TRANSESOPHAGEAL  Patient Location: PACU  Anesthesia Type:General  Level of Consciousness: drowsy and patient cooperative  Airway & Oxygen  Therapy: Patient Spontanous Breathing  Post-op Assessment: Report given to RN and Post -op Vital signs reviewed and stable  Post vital signs: Reviewed and stable  Last Vitals:  Vitals Value Taken Time  BP 89/60 01/05/24  1402  Temp 98.4 01/05/24  1402  Pulse 72 01/05/24   1402  Resp 20 01/05/24  1402  SpO2 99 01/05/24  1402    Last Pain:  Vitals:   01/05/24 1244  TempSrc: Oral  PainSc: 0-No pain      Patients Stated Pain Goal: 3 (01/05/24 1244)  Complications: No notable events documented.

## 2024-01-05 NOTE — Progress Notes (Signed)
 OT Cancellation Note  Patient Details Name: Gary Carroll MRN: [id number] DOB: 1946-02-07   Cancelled Treatment:    Reason Eval/Treat Not Completed: Fatigue/lethargy limiting ability to participate. Pt refused therapy today stating that he was tired and hungry. Pt was educated on reason for mobility but continued to decline. Will attempt to see pt later as time permits.    Chrisette Man OT, MOT   Jayson Person 01/05/2024, 4:27 PM

## 2024-01-05 NOTE — H&P (Signed)
 CARDIOLOGY PRE-PROCEDURAL HISTORY AND PHYSICAL    Patient ID: Gary Carroll; [id number]; 09-20-1945   Admit date: 12/28/2023 Date of Consult: 01/05/2024  Primary Care Provider: [facility] Primary Cardiologist:  Primary Electrophysiologist:    History of Present Illness:   Gary Carroll is a 78 y/o M known to have severe AS s/p TAVR, PAF on AC, CAD on medical management, rectal adenocarcinoma, chronic diastolic heart failure is currently admitted to the medicine team for the management of Streptococcal bacteremia. TTE showed no vegetations. Stable for TEE today.  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 Wonda and Dr. Lucas   Severe aortic stenosis    Sleep apnea    Urinary frequency    Urinary urgency    Wears glasses     Past Surgical History:  Procedure Laterality Date   ABDOMINAL AORTOGRAM W/LOWER EXTREMITY N/A 02/05/2023   Procedure: ABDOMINAL AORTOGRAM W/LOWER EXTREMITY;  Surgeon: Wendel Lurena POUR, MD;  Location: [facility] INVASIVE CV LAB;  Service: Cardiovascular;  Laterality: N/A;   CATARACT EXTRACTION W/PHACO Left 12/07/2021   Procedure: CATARACT EXTRACTION PHACO AND INTRAOCULAR LENS PLACEMENT (IOC);  Surgeon: Harrie Agent, MD;  Location: [facility] ORS;  Service: Ophthalmology;  Laterality: Left;  CDE 7.75   CATARACT EXTRACTION W/PHACO Right 12/21/2021   Procedure:  CATARACT EXTRACTION PHACO AND INTRAOCULAR LENS PLACEMENT (IOC);  Surgeon: Harrie Agent, MD;  Location: [facility] ORS;  Service: Ophthalmology;  Laterality: Right;  CDE: 8.00   CHOLECYSTECTOMY     COLONOSCOPY  02/2012   Dr. Debrah: sigmoid, transverse, and ascending colon diverticulosis   COLONOSCOPY WITH PROPOFOL  N/A 01/13/2023   Procedure: COLONOSCOPY WITH PROPOFOL ;  Surgeon: Cindie Carlin POUR, DO;  Location: [facility] ENDO SUITE;  Service: Endoscopy;  Laterality: N/A;  1:00 pm, asa 3   FINGER SURGERY Right    INTRAOPERATIVE TRANSTHORACIC ECHOCARDIOGRAM N/A 02/25/2023   Procedure: INTRAOPERATIVE TRANSTHORACIC ECHOCARDIOGRAM;  Surgeon: Wonda Sharper, MD;  Location: [facility] INVASIVE CV LAB;  Service: Open Heart Surgery;  Laterality: N/A;   prostate procedure  2017   per patient, was having trouble urinating    RIGHT HEART CATH AND CORONARY ANGIOGRAPHY N/A 02/05/2023   Procedure: RIGHT HEART CATH AND CORONARY ANGIOGRAPHY;  Surgeon: Wendel Lurena POUR, MD;  Location: [facility] IN[facility] LAB;  Service: Cardiovascular;  Laterality: N/A;   TONSILLECTOMY     TOTAL KNEE ARTHROPLASTY  2012   left (Dr. Dorian)   TOTAL KNEE ARTHROPLASTY Right 06/19/2015   Procedure: TOTAL KNEE ARTHROPLASTY;  Surgeon: Marcey Raman, MD;  Location: [facility] OR;  Service: Orthopedics;  Laterality: Right;   TRANSCATHETER AORTIC VALVE REPLACEMENT, TRANSFEMORAL N/A 02/25/2023   Procedure: Transcatheter Aortic Valve Replacement, Transfemoral;  Surgeon: Wonda Sharper, MD;  Location: [facility] INVASIVE CV LAB;  Service: Open Heart Surgery;  Laterality: N/A;       Inpatient Medications: Scheduled Meds:  apixaban   5 mg Oral BID   ascorbic acid   500 mg Oral BID   Chlorhexidine  Gluconate Cloth  6 each Topical Q0600   clotrimazole -betamethasone   1 Application Topical BID   diltiazem   240 mg Oral Daily   dutasteride   0.5 mg Oral Daily   feeding supplement  237 mL Oral BID BM   leptospermum manuka honey  1 Application Topical Daily   liver oil-zinc  oxide   Topical  BID   melatonin  9 mg Oral QHS   metoprolol  tartrate  25 mg Oral BID   multivitamin with minerals  1 tablet Oral Daily   mupirocin  ointment   Nasal BID   oxybutynin   5 mg Oral QPM   saccharomyces boulardii  250 mg Oral BID   sodium chloride  flush  3 mL Intravenous Q12H   zinc  sulfate (50mg  elemental zinc )  220 mg Oral Daily   Continuous Infusions:  penicillin  G potassium 24 Million Units in dextrose  5 % 500 mL CONTINUOUS infusion 24 Million Units (01/04/24 1713)   PRN Meds: acetaminophen  **OR** acetaminophen , bisacodyl , HYDROmorphone  (DILAUDID ) injection, ipratropium, levalbuterol , metoprolol  tartrate, ondansetron  **OR** ondansetron  (ZOFRAN ) IV, mouth rinse, oxyCODONE , senna-docusate, sodium chloride  flush, traZODone   Allergies:    Allergies  Allergen Reactions   Diovan [Valsartan] Cough   Tagamet [Cimetidine] Other (See Comments)    Irritability     Social History:   Social History   Socioeconomic History   Marital status: Divorced    Spouse name: deceased   Number of children: 0   Years of education: Not on file   Highest education level: 12th grade  Occupational History   Occupation: retired Lobbyist)    Employer: RETIRED  Tobacco Use   Smoking status: Former    Current packs/day: 0.00    Types: Cigarettes    Quit date: 01/18/2010    Years since quitting: 13.9   Smokeless tobacco: Never   Tobacco comments:    Smoked for 25 years- quit September 2011.  Passive exposure from wife  Vaping Use   Vaping status: Never Used  Substance and Sexual Activity   Alcohol use: Not Currently    Alcohol/week: 1.0 - 2.0 standard drink of alcohol    Types: 1 - 2 Glasses of wine per week   Drug use: No   Sexual activity: Not on file  Other Topics Concern   Not on file  Social History Narrative   Separated from wife (2014) and moved to [place]. Retired- worked in [employer]   09/26/22 divorced twice, his last wife died, no children, no living siblings, has a few  cousins  in their 70s and over 64 years old.    has been using [employer] recently to get food.    Prior illness he had been independent with ADLs/IADLs, even driving.   Social Drivers of Corporate investment banker Strain: Low Risk  (09/26/2023)   Overall Financial Resource Strain (CARDIA)    Difficulty of Paying Living Expenses: Not hard at all  Food Insecurity: Patient Unable To Answer (12/28/2023)   Hunger Vital Sign    Worried About Running Out of Food in the Last Year: Patient unable to answer    Ran Out of Food in the Last Year: Patient unable to answer  Transportation Needs: Patient Unable To Answer (12/28/2023)   PRAPARE - Transportation    Lack of Transportation (Medical): Patient unable to answer  Lack of Transportation (Non-Medical): Patient unable to answer  Physical Activity: Inactive (09/26/2023)   Exercise Vital Sign    Days of Exercise per Week: 0 days    Minutes of Exercise per Session: 0 min  Stress: No Stress Concern Present (09/26/2023)   [employer] of Occupational Health - Occupational Stress Questionnaire    Feeling of Stress : Only a little  Social Connections: Patient Unable To Answer (12/28/2023)   Social Connection and Isolation Panel    Frequency of Communication with Friends and Family: Patient unable to answer    Frequency of Social Gatherings with Friends and Family: Patient unable to answer    Attends Religious Services: Patient unable to answer    Active Member of Clubs or Organizations: Patient unable to answer    Attends Banker Meetings: Patient unable to answer    Marital Status: Patient unable to answer  Intimate Partner Violence: Patient Unable To Answer (12/28/2023)   Humiliation, Afraid, Rape, and Kick questionnaire    Fear of Current or Ex-Partner: Patient unable to answer    Emotionally Abused: Patient unable to answer    Physically Abused: Patient unable to answer    Sexually Abused: Patient unable to answer    Family  History:    Family History  Problem Relation Age of Onset   Uterine cancer Mother    Cancer Mother        uterine   Coronary artery disease Father    Coronary artery disease Paternal Grandfather    Heart disease Paternal Grandfather    Colon cancer Neg Hx    Esophageal cancer Neg Hx    Stomach cancer Neg Hx    Rectal cancer Neg Hx    Liver disease Neg Hx      ROS:  Please see the history of present illness.  ROS  All other ROS reviewed and negative.     Physical Exam/Data:   Vitals:   01/05/24 1400 01/05/24 1415 01/05/24 1423 01/05/24 1430  BP: (!) 94/59 96/61 95/63    Pulse: (!) 59 95 75 70  Resp:  15 19 19   Temp: 98.4 F (36.9 C)   98.5 F (36.9 C)  TempSrc:      SpO2: 100% 100% 99% 99%  Weight:      Height:        Intake/Output Summary (Last 24 hours) at 01/05/2024 1453 Last data filed at 01/05/2024 1435 Gross per 24 hour  Intake 813 ml  Output 2150 ml  Net -1337 ml   Filed Weights   01/03/24 0600 01/04/24 0500 01/05/24 0500  Weight: 97.3 kg 92.6 kg 93.1 kg   Body mass index is 26.35 kg/m.  General:  Well nourished, well developed, in no acute distress HEENT: normal Lymph: no adenopathy Neck: no JVD Endocrine:  No thryomegaly Vascular: No carotid bruits; FA pulses 2+ bilaterally without bruits  Cardiac:  normal S1, S2; RRR; no murmur  Lungs:  clear to auscultation bilaterally, no wheezing, rhonchi or rales  Abd: soft, nontender, no hepatomegaly  Ext: no edema Musculoskeletal:  No deformities, BUE and BLE strength normal and equal Skin: warm and dry  Neuro:  CNs 2-12 intact, no focal abnormalities noted Psych:  Normal affect    Laboratory Data:  Chemistry Recent Labs  Lab 01/02/24 0455 01/03/24 0408 01/05/24 0434  NA 137 139 136  K 3.7 3.7 4.0  CL 110 110 104  CO2 23 25 25   GLUCOSE 93 106* 99  BUN 8 8 9  CREATININE 0.49* 0.45* 0.48*  CALCIUM  7.7* 8.0* 8.2*  GFRNONAA >60 >60 >60  ANIONGAP 4* 4* 7    Recent Labs  Lab 12/30/23 0505  12/31/23 0228 01/02/24 0455  PROT 4.7* 4.9* 5.0*  ALBUMIN 1.7* 1.8* 1.8*  AST 49* 67* 48*  ALT 31 40 42  ALKPHOS 82 86 78  BILITOT 0.5 0.5 0.3   Hematology Recent Labs  Lab 01/01/24 0455 01/02/24 0455 01/05/24 0434  WBC 6.2 6.6 5.6  RBC 2.82* 2.84* 3.05*  HGB 8.4* 8.4* 8.9*  HCT 26.7* 26.9* 28.8*  MCV 94.7 94.7 94.4  MCH 29.8 29.6 29.2  MCHC 31.5 31.2 30.9  RDW 16.2* 16.4* 17.2*  PLT 80* 92* 165   Cardiac EnzymesNo results for input(s): TROPONINI in the last 168 hours. No results for input(s): TROPIPOC in the last 168 hours.  BNP Recent Labs  Lab 12/30/23 0505 12/31/23 0228  BNP 273.0* 286.0*    DDimer No results for input(s): DDIMER in the last 168 hours.  Radiology/Studies:  No results found.  Assessment and Plan:   Streptococcal bacteremia S/p TAVR - TTE normal. Stable for TEE today. - After careful review of history and examination, the risks and benefits of transesophageal echocardiogram have been explained including risks of esophageal damage, perforation (1:10,000 risk), bleeding, pharyngeal hematoma as well as other potential complications associated with conscious sedation including aspiration, arrhythmia, respiratory failure and death. Alternatives to treatment were discussed, questions were answered. Patient is willing to proceed.  - Hb >8 and platelets > 100 k.   For questions or updates, please contact [facility] Please consult [email or web address] for contact info under Cardiology/STEMI.   Signed, Nkenge Sonntag Priya Lamerle Jabs, MD 01/05/2024

## 2024-01-05 NOTE — Anesthesia Procedure Notes (Signed)
 Date/Time: 01/05/2024 1:12 PM  Performed by: Barbarann Verneita RAMAN, CRNAPre-anesthesia Checklist: Patient identified, Emergency Drugs available, Suction available, Timeout performed and Patient being monitored Patient Re-evaluated:Patient Re-evaluated prior to induction Oxygen  Delivery Method: Nasal cannula Comments: Optiflow

## 2024-01-05 NOTE — Progress Notes (Signed)
 OT Cancellation Note  Patient Details Name: Gary Carroll MRN: [id number] DOB: [birth year]   Cancelled Treatment:     Pt at procedure during attempted treatment. Will attempt to see pt later as time permits.   Ariyanna Oien OT, MOT   Jayson Person 01/05/2024, 2:07 PM

## 2024-01-05 NOTE — Progress Notes (Signed)
 Patient refused x4 to allow this writer to change his dressings this am. Pt got very angry and upset that this Clinical research associate even asked per pt statement. Pt did allow CNA to provide care but was still unhappy that staff woke him up. Blood work was drawn this am via PICC and sent to the lab for testing.

## 2024-01-05 NOTE — Progress Notes (Signed)
 I offered to perform the patient's wound care 4 separate times today. 1 time after Breakfast, 1 time after his TEE, 1 time before dinner, and 1 time after dinner. Patient refused wound care and stated he would be okay with waiting until the night nurse comes in to do his wound care.

## 2024-01-05 NOTE — Progress Notes (Signed)
*  PRELIMINARY RESULTS* Echocardiogram Echocardiogram Transesophageal has been performed.  Gary Carroll 01/05/2024, 2:17 PM

## 2024-01-05 NOTE — CV Procedure (Signed)
   TRANSESOPHAGEAL ECHOCARDIOGRAM  NAME:  CARSYN TAUBMAN    MRN: [id number] DOB:  February 28, 1946    ADMIT DATE: 12/28/2023  INDICATIONS: Streptococcal bacteremia  PROCEDURE:  Informed consent was obtained prior to the procedure. After a procedural time-out, the oropharynx was anesthetized and the patient was sedated by the anesthesia service. The transesophageal probe was inserted in the esophagus and stomach without difficulty and multiple views were obtained. Anesthesia was monitored by Dr. Landry. There were no apparent complications.  The patient had normal neuro status and respiratory status post procedure with vitals stable as recorded elsewhere.  Adequate airway was maintained throughout and vital signs monitored per protocol.  KEY FINDINGS: No vegetations on mitral valve, TAVR valve, tricuspid valve and pulmonic valve. No evidence of LAA thrombus. Full report to follow.   Taison Celani Priya Leanda Padmore, MD [place]  [facility]  2:58 PM

## 2024-01-05 NOTE — Anesthesia Preprocedure Evaluation (Addendum)
 Anesthesia Evaluation  Patient identified by MRN, date of birth, ID band Patient awake    Reviewed: Allergy & Precautions, NPO status , Patient's Chart, lab work & pertinent test results  Airway Mallampati: III  TM Distance: >3 FB Neck ROM: Full    Dental  (+) Dental Advisory Given, Poor Dentition   Pulmonary asthma , sleep apnea and Continuous Positive Airway Pressure Ventilation , former smoker   Pulmonary exam normal breath sounds clear to auscultation       Cardiovascular Exercise Tolerance: Good hypertension, Pt. on medications + CAD  Normal cardiovascular exam+ dysrhythmias Atrial Fibrillation + Valvular Problems/Murmurs  Rhythm:Regular Rate:Normal  Echo normal after AVR   Neuro/Psych  Neuromuscular disease    GI/Hepatic Neg liver ROS, hiatal hernia,GERD  Medicated,,  Endo/Other  diabetes, Type 2    Renal/GU Renal diseaseHydronephrosis - labs OK  negative genitourinary   Musculoskeletal  (+) Arthritis , Osteoarthritis,    Abdominal   Peds negative pediatric ROS (+)  Hematology  (+) Blood dyscrasia, anemia   Anesthesia Other Findings   Reproductive/Obstetrics negative OB ROS                              Anesthesia Physical Anesthesia Plan  ASA: 3  Anesthesia Plan: General   Post-op Pain Management: Minimal or no pain anticipated   Induction: Intravenous  PONV Risk Score and Plan: Propofol  infusion  Airway Management Planned: Nasal Cannula and Natural Airway  Additional Equipment: None  Intra-op Plan:   Post-operative Plan:   Informed Consent: I have reviewed the patients History and Physical, chart, labs and discussed the procedure including the risks, benefits and alternatives for the proposed anesthesia with the patient or authorized representative who has indicated his/her understanding and acceptance.       Plan Discussed with: CRNA  Anesthesia Plan  Comments:          Anesthesia Quick Evaluation

## 2024-01-05 NOTE — Progress Notes (Signed)
 PROGRESS NOTE  Gary Carroll  FMW:[id number] DOB: December 16, 1945 DOA: 12/28/2023 PCP: [facility]   Chief Complaint  Patient presents with   Code Sepsis   Level of care: Telemetry  Brief Admission History:  Gary Carroll is a 78 year old with extensive history of severe aortic stenosis s/p TAVR, paroxysmal A-fib on Eliquis , CAD, rectal adenocarcinoma, HTN, HLD, DM2, chronic heart failure HFpEF, presented to ED from nursing home with chief complaint of generalized weaknesses, fever, confusion.  Patient is poor historian at this point-electronic record review reveals patient has had multiple hospitalization for multiple comorbidities last discharged on 09/30/2023 after treatment for sepsis, UTI secondary to E. coli, Enterobacter, E. coli bacteremia COVID infection in April, E. coli sepsis.  Labs: CMP potassium 5.3, BUN 28, creatinine 1.54, albumin 2.6, AST 63 ALT 47, lactic acid 5.0 CBC WBC 15.3, hemoglobin 11.9, neutrophil 15.0, INR 2.4, Respiratory panel-influenza A, B, COVID-all negative UA amber, moderate leukocytes, negative for nitrites, bacteria none, WBC > 50  Chest x-ray:  Patchy retrocardiac airspace opacities, likely atelectasis in the setting of low lung volumes. Alternatively, a developing bronchopneumonia could have this appearance in the correct clinical context.  Requested patient to be admitted, as patient meets sepsis criteria-likely source UTI with low probability of pneumonia  Sepsis protocol initiated, IV fluids, broad-spectrum antibiotics cultures been obtained   Assessment and Plan:  Group C Strep Sepsis - Sepsis physiology resolved  Group C Strep Bacteremia  -urinary source, UTI -Initiated sepsis protocol, aggressive IV fluid resuscitation, IV penicillin  per ID  -blood and urine cultures--Group C Strep positive infection -Lactic acidosis-  LA 5.0 trended down to 1.7 with treatments  -TEE planned for Monday 01/05/24   Adjustment disorder with  depressed mood - treating supportively  Type 2 diabetes mellitus, controlled -Holding home medication of metformin  -Checking CBG q. ACHS, SSI coverage -Check an A1c:  (last A1c 4 months ago 6.1)  Disease of anus and rectum, unspecified - History of rectal adenocarcinoma with chronic fissure - Continue wound care - No signs of infection  Insomnia - Continue home medication melatonin - As needed trazodone   Morbid obesity Body mass index is 34.17 kg/m. 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 with unspecified severity  - Continue wound care per nursing  S/P TAVR (transcatheter aortic valve replacement) - Stable continue meds - discussed with ID and cardiology, planning for TEE on Mon 8/18  (HFpEF) heart failure with preserved ejection fraction  - POA euvolemic - Last Echo 12/ 2024 EF 60-65%, normal LV function moderate concentric LVH normal right ventricular systolic function, normal mitral and tricuspid valve repair replaced aortic valve - Will monitor I's and O's, daily weight  Intake/Output Summary (Last 24 hours) at 01/05/2024 1229 Last data filed at 01/05/2024 9147 Gross per 24 hour  Intake 363 ml  Output 2650 ml  Net -2287 ml   CAD (coronary artery disease) - Currently stable - Continue home medication including beta-blocker, statins, apixaban  calcium  channel  Aortic stenosis - History of severe aortic stenosis, s/p TAVR Successful TAVR with 26 mm Edwards SAPIEN 3 THV on 02/25/2023   Elevated transaminase level - Likely exacerbated by sepsis, on statins - Continue IV fluids, holding statins - Will avoid hepatotoxins  Benign prostatic hyperplasia with lower urinary tract symptoms Acute urinary retention -Temporary Foley catheter initially placed, then was removed but needed to be replaced due to recurrent urinary retention -Resuming home meds  including  oxybutynin  -plan is to continue foley cath for now and follow up with urologist outpatient for foley removal and voiding trial  AF (paroxysmal atrial fibrillation)  - Rate controlled with diltiazem , metoprolol , apixaban   Gastroesophageal reflux disease without esophagitis - Continue PPI  AKI (acute kidney injury) -- Resolved - Monitoring BUN/creatinine closely likely elevated due to sepsis, hypovolemia hypotension - Avoid nephrotoxins, hypotension - Continue IVF  Hyperlipidemia, unspecified - on statins -holding rosuvastatin  d/t transaminitis - Will monitor LFTs  Essential hypertension - Currently in sepsis, ruling out septic shock -Holding BP medications including losartan   Hypokalemia -- check Mg -- 1.6 -- repleted   Hypomagnesemia -- IV replacement ordered  -- repleted   DVT prophylaxis: apixaban  Code Status: Full  Family Communication:  Disposition: SNF placement early next week     Consultants:   Procedures:   Antimicrobials:    Subjective: Pt agreeable to having TEE.  No specific complaints.       Objective: Vitals:   01/05/24 0500 01/05/24 0639 01/05/24 1050 01/05/24 1100  BP:  115/78 128/86   Pulse:  84 (!) 110 (!) 110  Resp:   20   Temp:  97.6 F (36.4 C)    TempSrc:  Oral    SpO2:  93%    Weight: 93.1 kg     Height:        Intake/Output Summary (Last 24 hours) at 01/05/2024 1229 Last data filed at 01/05/2024 9147 Gross per 24 hour  Intake 363 ml  Output 2650 ml  Net -2287 ml   Filed Weights   01/03/24 0600 01/04/24 0500 01/05/24 0500  Weight: 97.3 kg 92.6 kg 93.1 kg   Examination:  General exam: Appears calm and comfortable  Respiratory system: Clear to auscultation. Respiratory effort normal. Cardiovascular system: normal S1 & S2 heard. No JVD, murmurs, rubs, gallops or clicks. No pedal edema. Gastrointestinal system: Abdomen is nondistended, soft and nontender. No organomegaly or masses felt. Normal bowel sounds heard. Central  nervous system: Alert and oriented. No focal neurological deficits. Extremities: 1-2+ edema BLEs Symmetric 5 x 5 power. Skin: No rashes, lesions or ulcers. Psychiatry: Judgement and insight appear normal. Mood & affect appropriate.   Data Reviewed: I have personally reviewed following labs and imaging studies  CBC: Recent Labs  Lab 12/30/23 0505 12/31/23 0228 01/01/24 0455 01/02/24 0455 01/05/24 0434  WBC 7.4 6.3 6.2 6.6 5.6  HGB 8.7* 8.6* 8.4* 8.4* 8.9*  HCT 27.7* 27.2* 26.7* 26.9* 28.8*  MCV 96.5 93.8 94.7 94.7 94.4  PLT 85* 77* 80* 92* 165    Basic Metabolic Panel: Recent Labs  Lab 12/31/23 0228 12/31/23 1430 01/01/24 0455 01/02/24 0455 01/03/24 0408 01/05/24 0434  NA 138  --  136 137 139 136  K 3.3*  --  3.6 3.7 3.7 4.0  CL 109  --  108 110 110 104  CO2 23  --  23 23 25 25   GLUCOSE 97  --  96 93 106* 99  BUN 16  --  13 8 8 9   CREATININE 0.49*  --  0.48* 0.49* 0.45* 0.48*  CALCIUM  7.9*  --  7.6* 7.7* 8.0* 8.2*  MG  --  1.6* 1.6* 1.9  --   --     CBG: Recent Labs  Lab 12/30/23 0728 12/31/23 0727 01/01/24 0729  GLUCAP 83 101* 92    Recent Results (from the past 240 hours)  Resp panel by RT-PCR (RSV, Flu A&B, Covid) Anterior Nasal Swab  Status: None   Collection Time: 12/28/23 11:52 AM   Specimen: Anterior Nasal Swab  Result Value Ref Range Status   SARS Coronavirus 2 by RT PCR NEGATIVE NEGATIVE Final    Comment: (NOTE) SARS-CoV-2 target nucleic acids are NOT DETECTED.  The SARS-CoV-2 RNA is generally detectable in upper respiratory specimens during the acute phase of infection. The lowest concentration of SARS-CoV-2 viral copies this assay can detect is 138 copies/mL. A negative result does not preclude SARS-Cov-2 infection and should not be used as the sole basis for treatment or other patient management decisions. A negative result may occur with  improper specimen collection/handling, submission of specimen other than nasopharyngeal swab,  presence of viral mutation(s) within the areas targeted by this assay, and inadequate number of viral copies(<138 copies/mL). A negative result must be combined with clinical observations, patient history, and epidemiological information. The expected result is Negative.  Fact Sheet for Patients:  BloggerCourse.com  Fact Sheet for Healthcare Providers:  SeriousBroker.it  This test is no t yet approved or cleared by the [place]  FDA and  has been authorized for detection and/or diagnosis of SARS-CoV-2 by FDA under an Emergency Use Authorization (EUA). This EUA will remain  in effect (meaning this test can be used) for the duration of the COVID-19 declaration under Section 564(b)(1) of the Act, 21 U.S.C.section 360bbb-3(b)(1), unless the authorization is terminated  or revoked sooner.       Influenza A by PCR NEGATIVE NEGATIVE Final   Influenza B by PCR NEGATIVE NEGATIVE Final    Comment: (NOTE) The Xpert Xpress SARS-CoV-2/FLU/RSV plus assay is intended as an aid in the diagnosis of influenza from Nasopharyngeal swab specimens and should not be used as a sole basis for treatment. Nasal washings and aspirates are unacceptable for Xpert Xpress SARS-CoV-2/FLU/RSV testing.  Fact Sheet for Patients: BloggerCourse.com  Fact Sheet for Healthcare Providers: SeriousBroker.it  This test is not yet approved or cleared by the [place]  FDA and has been authorized for detection and/or diagnosis of SARS-CoV-2 by FDA under an Emergency Use Authorization (EUA). This EUA will remain in effect (meaning this test can be used) for the duration of the COVID-19 declaration under Section 564(b)(1) of the Act, 21 U.S.C. section 360bbb-3(b)(1), unless the authorization is terminated or revoked.     Resp Syncytial Virus by PCR NEGATIVE NEGATIVE Final    Comment: (NOTE) Fact Sheet for  Patients: BloggerCourse.com  Fact Sheet for Healthcare Providers: SeriousBroker.it  This test is not yet approved or cleared by the [place]  FDA and has been authorized for detection and/or diagnosis of SARS-CoV-2 by FDA under an Emergency Use Authorization (EUA). This EUA will remain in effect (meaning this test can be used) for the duration of the COVID-19 declaration under Section 564(b)(1) of the Act, 21 U.S.C. section 360bbb-3(b)(1), unless the authorization is terminated or revoked.  Performed at [facility], [address], [place], [place] [address]   Urine Culture     Status: Abnormal   Collection Time: 12/28/23 11:53 AM   Specimen: Urine, Random  Result Value Ref Range Status   Specimen Description   Final    URINE, RANDOM Performed at [facility], [address] [place], [place] [address]    Special Requests   Final    NONE Reflexed from [phone] Performed at [facility], [address] [place], [place] [address]    Culture 40,000 COLONIES/mL YEAST (A)  Final   Report Status 12/31/2023 FINAL  Final  Blood Culture (routine  x 2)     Status: Abnormal   Collection Time: 12/28/23 12:25 PM   Specimen: Right Antecubital; Blood  Result Value Ref Range Status   Specimen Description   Final    RIGHT ANTECUBITAL BOTTLES DRAWN AEROBIC AND ANAEROBIC Performed at [facility], [address] [place], [place] [address]    Special Requests   Final    Blood Culture adequate volume Performed at [facility], [address] [place], [place] [address]    Culture  Setup Time   Final    GRAM POSITIVE COCCI IN BOTH AEROBIC AND ANAEROBIC BOTTLES Gram Stain Report Called to,Read Back By and Verified With: C. KINDLEY ON 12/28/2023 @8 :20PM BY T.HAMER  Performed at [facility], [address], [place], [place] [address]    Culture (A)  Final    STREPTOCOCCUS GROUP C SUSCEPTIBILITIES PERFORMED ON PREVIOUS CULTURE WITHIN THE LAST 5  DAYS. Performed at [facility], [address] [place], [place] [address]    Report Status 12/30/2023 FINAL  Final  Blood Culture (routine x 2)     Status: Abnormal   Collection Time: 12/28/23 12:25 PM   Specimen: Left Antecubital; Blood  Result Value Ref Range Status   Specimen Description   Final    LEFT ANTECUBITAL BOTTLES DRAWN AEROBIC AND ANAEROBIC Performed at [facility], [address] [place], [place] [address]    Special Requests   Final    Blood Culture adequate volume Performed at [facility], [address] [place], [place] [address]    Culture  Setup Time   Final    GRAM POSITIVE COCCI Gram Stain Report Called to,Read Back By and Verified With: KINDLEY,C @ 2020 ON 12/28/23 BY [facility] IN BOTH AEROBIC AND ANAEROBIC BOTTLES GS DONE @ [facility] CRITICAL RESULT CALLED TO, READ BACK BY AND VERIFIED WITHBETHA JAYSON LINGER RN 12/29/2023 @ 0010 BY AB Performed at [facility], [address] [place], [place] [address]    Culture STREPTOCOCCUS GROUP C (A)  Final   Report Status 12/30/2023 FINAL  Final   Organism ID, Bacteria STREPTOCOCCUS GROUP C  Final      Susceptibility   Streptococcus group c - MIC*    CLINDAMYCIN RESISTANT Resistant     AMPICILLIN <=0.25 SENSITIVE Sensitive     ERYTHROMYCIN >=8 RESISTANT Resistant     VANCOMYCIN  0.5 SENSITIVE Sensitive     CEFTRIAXONE  <=0.12 SENSITIVE Sensitive     LEVOFLOXACIN 0.5 SENSITIVE Sensitive     PENICILLIN  <=0.06 SENSITIVE Sensitive     * STREPTOCOCCUS GROUP C  Blood Culture ID Panel (Reflexed)     Status: Abnormal   Collection Time: 12/28/23 12:25 PM  Result Value Ref Range Status   Enterococcus faecalis NOT DETECTED NOT DETECTED Final   Enterococcus Faecium NOT DETECTED NOT DETECTED Final   Listeria monocytogenes NOT DETECTED NOT DETECTED Final   Staphylococcus species NOT DETECTED NOT DETECTED Final   Staphylococcus aureus (BCID) NOT DETECTED NOT DETECTED Final   Staphylococcus epidermidis NOT DETECTED NOT DETECTED  Final   Staphylococcus lugdunensis NOT DETECTED NOT DETECTED Final   Streptococcus species DETECTED (A) NOT DETECTED Final    Comment: Not Enterococcus species, Streptococcus agalactiae, Streptococcus pyogenes, or Streptococcus pneumoniae. CRITICAL RESULT CALLED TO, READ BACK BY AND VERIFIED WITH: C KINDLEY RN 12/29/2023 @ 0010 BY AB    Streptococcus agalactiae NOT DETECTED NOT DETECTED Final   Streptococcus pneumoniae NOT DETECTED NOT DETECTED Final   Streptococcus pyogenes NOT DETECTED NOT DETECTED Final  A.calcoaceticus-baumannii NOT DETECTED NOT DETECTED Final   Bacteroides fragilis NOT DETECTED NOT DETECTED Final   Enterobacterales NOT DETECTED NOT DETECTED Final   Enterobacter cloacae complex NOT DETECTED NOT DETECTED Final   Escherichia coli NOT DETECTED NOT DETECTED Final   Klebsiella aerogenes NOT DETECTED NOT DETECTED Final   Klebsiella oxytoca NOT DETECTED NOT DETECTED Final   Klebsiella pneumoniae NOT DETECTED NOT DETECTED Final   Proteus species NOT DETECTED NOT DETECTED Final   Salmonella species NOT DETECTED NOT DETECTED Final   Serratia marcescens NOT DETECTED NOT DETECTED Final   Haemophilus influenzae NOT DETECTED NOT DETECTED Final   Neisseria meningitidis NOT DETECTED NOT DETECTED Final   Pseudomonas aeruginosa NOT DETECTED NOT DETECTED Final   Stenotrophomonas maltophilia NOT DETECTED NOT DETECTED Final   Candida albicans NOT DETECTED NOT DETECTED Final   Candida auris NOT DETECTED NOT DETECTED Final   Candida glabrata NOT DETECTED NOT DETECTED Final   Candida krusei NOT DETECTED NOT DETECTED Final   Candida parapsilosis NOT DETECTED NOT DETECTED Final   Candida tropicalis NOT DETECTED NOT DETECTED Final   Cryptococcus neoformans/gattii NOT DETECTED NOT DETECTED Final    Comment: Performed at [facility], [address], [place], [place] [address]  MRSA Next Gen by PCR, Nasal     Status: Abnormal   Collection Time: 12/28/23  2:30 PM   Specimen: Nasal  Mucosa; Nasal Swab  Result Value Ref Range Status   MRSA by PCR Next Gen DETECTED (A) NOT DETECTED Final    Comment: RESULT CALLED TO, READ BACK BY AND VERIFIED WITH: A SHELTON AT 1743 ON [id number] BY S DALTON (NOTE) The GeneXpert MRSA Assay (FDA approved for NASAL specimens only), is one component of a comprehensive MRSA colonization surveillance program. It is not intended to diagnose MRSA infection nor to guide or monitor treatment for MRSA infections. Test performance is not FDA approved in patients less than 40 years old. Performed at [facility], [address], [place], [place] [address]   Culture, blood (Routine X 2) w Reflex to ID Panel     Status: None   Collection Time: 12/29/23 11:31 PM   Specimen: BLOOD  Result Value Ref Range Status   Specimen Description BLOOD LEFT ANTECUBITAL  Final   Special Requests   Final    BOTTLES DRAWN AEROBIC AND ANAEROBIC Blood Culture adequate volume   Culture   Final    NO GROWTH 5 DAYS Performed at [facility], [address] [place], [place] [address]    Report Status 01/03/2024 FINAL  Final  Culture, blood (Routine X 2) w Reflex to ID Panel     Status: None   Collection Time: 12/29/23 11:32 PM   Specimen: BLOOD LEFT HAND  Result Value Ref Range Status   Specimen Description BLOOD LEFT HAND  Final   Special Requests   Final    AEROBIC BOTTLE ONLY Blood Culture results may not be optimal due to an inadequate volume of blood received in culture bottles   Culture   Final    NO GROWTH 5 DAYS Performed at [facility], [address], [place], [place] [address]    Report Status 01/03/2024 FINAL  Final     Radiology Studies: No results found.   Scheduled Meds:  apixaban   5 mg Oral BID   ascorbic acid   500 mg Oral BID   Chlorhexidine  Gluconate Cloth  6 each Topical Q0600   clotrimazole -betamethasone   1 Application Topical BID   diltiazem   240  mg Oral Daily   dutasteride   0.5 mg Oral Daily   feeding supplement  237 mL Oral BID BM    leptospermum manuka honey  1 Application Topical Daily   liver oil-zinc  oxide   Topical BID   melatonin  9 mg Oral QHS   metoprolol  tartrate  25 mg Oral BID   multivitamin with minerals  1 tablet Oral Daily   mupirocin  ointment   Nasal BID   oxybutynin   5 mg Oral QPM   saccharomyces boulardii  250 mg Oral BID   sodium chloride  flush  3 mL Intravenous Q12H   zinc  sulfate (50mg  elemental zinc )  220 mg Oral Daily   Continuous Infusions:  penicillin  G potassium 24 Million Units in dextrose  5 % 500 mL CONTINUOUS infusion 24 Million Units (01/04/24 1713)    LOS: 8 days   Time spent: 50 mins   Gary Pixler [name], MD How to contact the [facility] Attending or Consulting provider 7A - 7P or covering provider during after hours 7P -7A, for this patient?  Check the care team in [facility] and look for a) attending/consulting [facility] provider listed and b) the TR[facility] team listed Log into [email or web address] to find provider on call.  Locate the [facility] provider you are looking for under [facility] and page to a number that you can be directly reached. If you still have difficulty reaching the provider, please page the [facility] (Director on Call) for the Hospitalists listed on amion for assistance.  01/05/2024, 12:29 PM

## 2024-01-05 NOTE — Plan of Care (Signed)

## 2024-01-05 NOTE — Progress Notes (Signed)
 [facility] Infectious Diseases Follow Up Note  Patient Identification: Patient Name: Gary Carroll MRN: [id number] Admit Date: 12/28/2023 11:35 AM Age: 78 y.o.Today's Date: 01/05/2024  Reason for Visit: fu on bacteremia  I connected with the patient by a video enabled telemedicine application and verified that I am speaking with the correct person using two identifiers.  Location: Patient: Gary Carroll  Provider: [facility]  Principal Problem:   Septic shock [facility]) Active Problems:   Essential hypertension   Hyperlipidemia, unspecified   AKI (acute kidney injury) (HCC)   Gastroesophageal reflux disease without esophagitis   AF (paroxysmal atrial fibrillation) (HCC)   Benign prostatic hyperplasia with lower urinary tract symptoms   Urinary tract infection   Elevated transaminase level   Aortic stenosis   CAD (coronary artery disease)   (HFpEF) heart failure with preserved ejection fraction (HCC)   S/P TAVR (transcatheter aortic valve replacement)   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 with unspecified severity (HCC)   Morbid obesity (HCC)   Insomnia   Disease of anus and rectum, unspecified   Chronic venous insufficiency   Complication of diabetes mellitus (HCC)   Sepsis secondary to UTI (HCC)   Adjustment disorder with depressed mood   Sepsis (HCC)   Hypotensive episode   Bacteremia   Lactic acidosis  Antibiotics:  Vancomycin  8/10 Pip-tazo 8/10-8/11 Ceftriaxone  8/10-8/12, Pen G 8/13-c Azithromycin  8/10   Lines/Hardware: PICC placed 8/13, bilateral prosthetic knee  Interval Events: Remains afebrile Labs with unremarkable CBC and BMP  Assessment 78 year old male with multiple comorbidities as below including h/o rectal cancer with chronic fissure, DM, A-fib, CAD, HLD, HTN, COPD on 2 L nasal cannula severe aortic stenosis s/p TAVR, bilateral knee arthroplasty, chronic rt leg wound, strep  dysgalactiae bacteremia in 2018, E coli bacteremia/UTI in May 2025 who presented to the ED on 8/10 for fever, weakness and confusion.  At ED febrile with sepsis/shock, hyperkalemia, AKI. ID engaged for   # Strep group C bacteremia r/o endocarditis  - unclear source - no concerns noted in bilateral prosthetic knee - 8/11 blood culture NG in 4 days - PICC placed on 8/13 - 8/12 TTE negative for vegetations or endocarditis, poor study   - 8/18 TEE negative for endocarditis  -  # ? Pneumonia in CXR -no respiratory symptoms documented, on room air    # Anal fissure/chronic wound rt leg-does not appear to be infected based on pictures in media, wound care and d/w primary team  Recommendations - Can transition IV Pen G  to PO amoxicillin  1g po tid for 14 days from negative blood cx on 8/11. EOT 8/25 - monitor CBC and BMP on antibiotics  - Maintain standard isolation precautions D/w primary team ID will so, recall back with questions or concerns   Rest of the management as per the primary team. Thank you for the consult. Please page with pertinent questions or concerns.  ______________________________________________________________________ Subjective patient seen at the bedside through video. He was sleeping and I did not wake him up. No concerns per primary or RN.   Vitals BP 115/78 (BP Location: Left Arm)   Pulse 84   Temp 97.6 F (36.4 C) (Oral)   Resp (!) 22   Ht 6' 2 (1.88 m)   Wt 93.1 kg   SpO2 93%   BMI 26.35 kg/m     Physical Exam Lying in the bed and sleeping  Pertinent Microbiology Results for orders placed or performed during the hospital encounter of  12/28/23  Resp panel by RT-PCR (RSV, Flu A&B, Covid) Anterior Nasal Swab     Status: None   Collection Time: 12/28/23 11:52 AM   Specimen: Anterior Nasal Swab  Result Value Ref Range Status   SARS Coronavirus 2 by RT PCR NEGATIVE NEGATIVE Final    Comment: (NOTE) SARS-CoV-2 target nucleic acids are NOT  DETECTED.  The SARS-CoV-2 RNA is generally detectable in upper respiratory specimens during the acute phase of infection. The lowest concentration of SARS-CoV-2 viral copies this assay can detect is 138 copies/mL. A negative result does not preclude SARS-Cov-2 infection and should not be used as the sole basis for treatment or other patient management decisions. A negative result may occur with  improper specimen collection/handling, submission of specimen other than nasopharyngeal swab, presence of viral mutation(s) within the areas targeted by this assay, and inadequate number of viral copies(<138 copies/mL). A negative result must be combined with clinical observations, patient history, and epidemiological information. The expected result is Negative.  Fact Sheet for Patients:  BloggerCourse.com  Fact Sheet for Healthcare Providers:  SeriousBroker.it  This test is no t yet approved or cleared by the [place]  FDA and  has been authorized for detection and/or diagnosis of SARS-CoV-2 by FDA under an Emergency Use Authorization (EUA). This EUA will remain  in effect (meaning this test can be used) for the duration of the COVID-19 declaration under Section 564(b)(1) of the Act, 21 U.S.C.section 360bbb-3(b)(1), unless the authorization is terminated  or revoked sooner.       Influenza A by PCR NEGATIVE NEGATIVE Final   Influenza B by PCR NEGATIVE NEGATIVE Final    Comment: (NOTE) The Xpert Xpress SARS-CoV-2/FLU/RSV plus assay is intended as an aid in the diagnosis of influenza from Nasopharyngeal swab specimens and should not be used as a sole basis for treatment. Nasal washings and aspirates are unacceptable for Xpert Xpress SARS-CoV-2/FLU/RSV testing.  Fact Sheet for Patients: BloggerCourse.com  Fact Sheet for Healthcare Providers: SeriousBroker.it  This test is not yet  approved or cleared by the [place]  FDA and has been authorized for detection and/or diagnosis of SARS-CoV-2 by FDA under an Emergency Use Authorization (EUA). This EUA will remain in effect (meaning this test can be used) for the duration of the COVID-19 declaration under Section 564(b)(1) of the Act, 21 U.S.C. section 360bbb-3(b)(1), unless the authorization is terminated or revoked.     Resp Syncytial Virus by PCR NEGATIVE NEGATIVE Final    Comment: (NOTE) Fact Sheet for Patients: BloggerCourse.com  Fact Sheet for Healthcare Providers: SeriousBroker.it  This test is not yet approved or cleared by the [place]  FDA and has been authorized for detection and/or diagnosis of SARS-CoV-2 by FDA under an Emergency Use Authorization (EUA). This EUA will remain in effect (meaning this test can be used) for the duration of the COVID-19 declaration under Section 564(b)(1) of the Act, 21 U.S.C. section 360bbb-3(b)(1), unless the authorization is terminated or revoked.  Performed at [facility], [address], [place], [place] [address]   Urine Culture     Status: Abnormal   Collection Time: 12/28/23 11:53 AM   Specimen: Urine, Random  Result Value Ref Range Status   Specimen Description   Final    URINE, RANDOM Performed at [facility], [address], [place], [place] [address]    Special Requests   Final    NONE Reflexed from [id number] Performed at [facility], [address], [place], [place] [address]    Culture 40,000  COLONIES/mL YEAST (A)  Final   Report Status 12/31/2023 FINAL  Final  Blood Culture (routine x 2)     Status: Abnormal   Collection Time: 12/28/23 12:25 PM   Specimen: Right Antecubital; Blood  Result Value Ref Range Status   Specimen Description   Final    RIGHT ANTECUBITAL BOTTLES DRAWN AEROBIC AND ANAEROBIC Performed at [facility], [address], [place], [place] [address]    Special Requests    Final    Blood Culture adequate volume Performed at [facility], [address], [place], [place] [address]    Culture  Setup Time   Final    GRAM POSITIVE COCCI IN BOTH AEROBIC AND ANAEROBIC BOTTLES Gram Stain Report Called to,Read Back By and Verified With: C. KINDLEY ON 12/28/2023 @8 :20PM BY T.HAMER  Performed at [facility], [address] [place], [place] [address]    Culture (A)  Final    STREPTOCOCCUS GROUP C SUSCEPTIBILITIES PERFORMED ON PREVIOUS CULTURE WITHIN THE LAST 5 DAYS. Performed at [facility], [address], [place], [place] [address]    Report Status 12/30/2023 FINAL  Final  Blood Culture (routine x 2)     Status: Abnormal   Collection Time: 12/28/23 12:25 PM   Specimen: Left Antecubital; Blood  Result Value Ref Range Status   Specimen Description   Final    LEFT ANTECUBITAL BOTTLES DRAWN AEROBIC AND ANAEROBIC Performed at [facility] [place], [address], [place], [place] [address]    Special Requests   Final    Blood Culture adequate volume Performed at [facility], [address], [place], [place] [address]    Culture  Setup Time   Final    GRAM POSITIVE COCCI Gram Stain Report Called to,Read Back By and Verified With: KINDLEY,C @ 2020 ON 12/28/23 BY [facility] IN BOTH AEROBIC AND ANAEROBIC BOTTLES GS DONE @ [facility] CRITICAL RESULT CALLED TO, READ BACK BY AND VERIFIED WITHBETHA JAYSON LINGER RN 12/29/2023 @ 0010 BY AB Performed at [facility], [address], [place], [place] [address]    Culture STREPTOCOCCUS GROUP C (A)  Final   Report Status 12/30/2023 FINAL  Final   Organism ID, Bacteria STREPTOCOCCUS GROUP C  Final      Susceptibility   Streptococcus group c - MIC*    CLINDAMYCIN RESISTANT Resistant     AMPICILLIN <=0.25 SENSITIVE Sensitive     ERYTHROMYCIN >=8 RESISTANT Resistant     VANCOMYCIN  0.5 SENSITIVE Sensitive     CEFTRIAXONE  <=0.12 SENSITIVE Sensitive     LEVOFLOXACIN 0.5 SENSITIVE Sensitive     PENICILLIN  <=0.06 SENSITIVE Sensitive     *  STREPTOCOCCUS GROUP C  Blood Culture ID Panel (Reflexed)     Status: Abnormal   Collection Time: 12/28/23 12:25 PM  Result Value Ref Range Status   Enterococcus faecalis NOT DETECTED NOT DETECTED Final   Enterococcus Faecium NOT DETECTED NOT DETECTED Final   Listeria monocytogenes NOT DETECTED NOT DETECTED Final   Staphylococcus species NOT DETECTED NOT DETECTED Final   Staphylococcus aureus (BCID) NOT DETECTED NOT DETECTED Final   Staphylococcus epidermidis NOT DETECTED NOT DETECTED Final   Staphylococcus lugdunensis NOT DETECTED NOT DETECTED Final   Streptococcus species DETECTED (A) NOT DETECTED Final    Comment: Not Enterococcus species, Streptococcus agalactiae, Streptococcus pyogenes, or Streptococcus pneumoniae. CRITICAL RESULT CALLED TO, READ BACK BY AND VERIFIED WITH: C KINDLEY RN 12/29/2023 @ 0010 BY AB    Streptococcus agalactiae NOT DETECTED NOT DETECTED Final  Streptococcus pneumoniae NOT DETECTED NOT DETECTED Final   Streptococcus pyogenes NOT DETECTED NOT DETECTED Final   A.calcoaceticus-baumannii NOT DETECTED NOT DETECTED Final   Bacteroides fragilis NOT DETECTED NOT DETECTED Final   Enterobacterales NOT DETECTED NOT DETECTED Final   Enterobacter cloacae complex NOT DETECTED NOT DETECTED Final   Escherichia coli NOT DETECTED NOT DETECTED Final   Klebsiella aerogenes NOT DETECTED NOT DETECTED Final   Klebsiella oxytoca NOT DETECTED NOT DETECTED Final   Klebsiella pneumoniae NOT DETECTED NOT DETECTED Final   Proteus species NOT DETECTED NOT DETECTED Final   Salmonella species NOT DETECTED NOT DETECTED Final   Serratia marcescens NOT DETECTED NOT DETECTED Final   Haemophilus influenzae NOT DETECTED NOT DETECTED Final   Neisseria meningitidis NOT DETECTED NOT DETECTED Final   Pseudomonas aeruginosa NOT DETECTED NOT DETECTED Final   Stenotrophomonas maltophilia NOT DETECTED NOT DETECTED Final   Candida albicans NOT DETECTED NOT DETECTED Final   Candida auris NOT DETECTED  NOT DETECTED Final   Candida glabrata NOT DETECTED NOT DETECTED Final   Candida krusei NOT DETECTED NOT DETECTED Final   Candida parapsilosis NOT DETECTED NOT DETECTED Final   Candida tropicalis NOT DETECTED NOT DETECTED Final   Cryptococcus neoformans/gattii NOT DETECTED NOT DETECTED Final    Comment: Performed at [facility], [address] [place], [place] [address]  MRSA Next Gen by PCR, Nasal     Status: Abnormal   Collection Time: 12/28/23  2:30 PM   Specimen: Nasal Mucosa; Nasal Swab  Result Value Ref Range Status   MRSA by PCR Next Gen DETECTED (A) NOT DETECTED Final    Comment: RESULT CALLED TO, READ BACK BY AND VERIFIED WITH: A SHELTON AT 1743 ON [id number] BY S DALTON (NOTE) The GeneXpert MRSA Assay (FDA approved for NASAL specimens only), is one component of a comprehensive MRSA colonization surveillance program. It is not intended to diagnose MRSA infection nor to guide or monitor treatment for MRSA infections. Test performance is not FDA approved in patients less than 64 years old. Performed at [facility], [address], [place], [place] [address]   Culture, blood (Routine X 2) w Reflex to ID Panel     Status: None   Collection Time: 12/29/23 11:31 PM   Specimen: BLOOD  Result Value Ref Range Status   Specimen Description BLOOD LEFT ANTECUBITAL  Final   Special Requests   Final    BOTTLES DRAWN AEROBIC AND ANAEROBIC Blood Culture adequate volume   Culture   Final    NO GROWTH 5 DAYS Performed at [facility], [address], [place], [place] [address]    Report Status 01/03/2024 FINAL  Final  Culture, blood (Routine X 2) w Reflex to ID Panel     Status: None   Collection Time: 12/29/23 11:32 PM   Specimen: BLOOD LEFT HAND  Result Value Ref Range Status   Specimen Description BLOOD LEFT HAND  Final   Special Requests   Final    AEROBIC BOTTLE ONLY Blood Culture results may not be optimal due to an inadequate volume of blood received in culture bottles    Culture   Final    NO GROWTH 5 DAYS Performed at [facility], [address] [place], [place] [address]    Report Status 01/03/2024 FINAL  Final    Pertinent Lab.    Latest Ref Rng & Units 01/05/2024    4:34 AM 01/02/2024    4:55 AM 01/01/2024    4:55 AM  CBC  WBC 4.0 - 10.5 K/uL 5.6  6.6  6.2   Hemoglobin 13.0 - 17.0 g/dL 8.9  8.4  8.4   Hematocrit 39.0 - 52.0 % 28.8  26.9  26.7   Platelets 150 - 400 K/uL 165  92  80       Latest Ref Rng & Units 01/05/2024    4:34 AM 01/03/2024    4:08 AM 01/02/2024    4:55 AM  CMP  Glucose 70 - 99 mg/dL 99  893  93   BUN 8 - 23 mg/dL 9  8  8    Creatinine 0.61 - 1.24 mg/dL 9.51  9.54  9.50   Sodium 135 - 145 mmol/L 136  139  137   Potassium 3.5 - 5.1 mmol/L 4.0  3.7  3.7   Chloride 98 - 111 mmol/L 104  110  110   CO2 22 - 32 mmol/L 25  25  23    Calcium  8.9 - 10.3 mg/dL 8.2  8.0  7.7   Total Protein 6.5 - 8.1 g/dL   5.0   Total Bilirubin 0.0 - 1.2 mg/dL   0.3   Alkaline Phos 38 - 126 U/L   78   AST 15 - 41 U/L   48   ALT 0 - 44 U/L   42      Pertinent Imaging today Plain films and CT images have been personally visualized and interpreted; radiology reports have been reviewed. Decision making incorporated into the Impression  ECHO TEE Result Date: 01/05/2024    TRANSESOPHOGEAL ECHO REPORT   Patient Name:   KANOA PHILLIPPI Date of Exam: 01/05/2024 Medical Rec #:  [id number]          Height:       74.0 in Accession #:    [id number]         Weight:       205.2 lb Date of Birth:  10-Feb-1946         BSA:          2.198 m Patient Age:    77 years           BP:           112/64 mmHg Patient Gender: M                  HR:           66 bpm. Exam Location:  [facility] Procedure: Transesophageal Echo, Cardiac Doppler and Color Doppler (Both            Spectral and Color Flow Doppler were utilized during procedure). Indications:    Bacteremia R78.81  History:        Patient has prior history of Echocardiogram examinations, most                 recent  12/30/2023. CAD, Aortic Valve Disease, Arrythmias:Atrial                 Fibrillation; Risk Factors:Hypertension, Diabetes, Dyslipidemia                 and Sleep Apnea. Aortic Valve: 26 mm Edwards Sapien prosthetic,                 stented (TAVR) valve is present in the aortic position.                 Procedure Date: 02/25/23.  Aortic Valve: 26 mm Sapien prosthetic, stented (TAVR) valve is                 present in the aortic position.  Sonographer:    Aida Pizza RCS Referring Phys: [id number] LAYMON CHRISTELLA QUA PROCEDURE: The transesophogeal probe was passed without difficulty through the esophogus of the patient. Imaged were obtained with the patient in a left lateral decubitus position. Sedation performed by different physician. Image quality was good. The patient's vital signs; including heart rate, blood pressure, and oxygen  saturation; remained stable throughout the procedure. Supplementary images were obtained from transthoracic windows as indicated to answer the clinical question. The patient developed no complications during the procedure.  IMPRESSIONS  1. Left ventricular ejection fraction, by estimation, is 60 to 65%. The left ventricle has normal function. The left ventricle has no regional wall motion abnormalities. There is mild asymmetric left ventricular hypertrophy of the septal and basal segments. Left ventricular diastolic function could not be evaluated.  2. Right ventricular systolic function is normal. The right ventricular size is normal. Tricuspid regurgitation signal is inadequate for assessing PA pressure.  3. No left atrial/left atrial appendage thrombus was detected.  4. The mitral valve is normal in structure. Trivial mitral valve regurgitation. No evidence of mitral stenosis.  5. The aortic valve has been repaired/replaced. Aortic valve regurgitation is trivial. No aortic stenosis is present. There is a 26 mm Sapien prosthetic (TAVR) valve present in the aortic  position. Echo findings are consistent with normal structure and function of the aortic valve prosthesis. Conclusion(s)/Recommendation(s): No evidence of vegetation/infective endocarditis on this transesophageael echocardiogram. FINDINGS  Left Ventricle: Left ventricular ejection fraction, by estimation, is 60 to 65%. The left ventricle has normal function. The left ventricle has no regional wall motion abnormalities. The left ventricular internal cavity size was normal in size. There is  mild asymmetric left ventricular hypertrophy of the septal and basal segments. Left ventricular diastolic function could not be evaluated. Right Ventricle: The right ventricular size is normal. No increase in right ventricular wall thickness. Right ventricular systolic function is normal. Tricuspid regurgitation signal is inadequate for assessing PA pressure. Left Atrium: Left atrial size was not assessed. No left atrial/left atrial appendage thrombus was detected. Right Atrium: Right atrial size was not assessed. Pericardium: There is no evidence of pericardial effusion. Mitral Valve: The mitral valve is normal in structure. Trivial mitral valve regurgitation. No evidence of mitral valve stenosis. Tricuspid Valve: The tricuspid valve is normal in structure. Tricuspid valve regurgitation is mild . No evidence of tricuspid stenosis. Aortic Valve: The aortic valve has been repaired/replaced. Aortic valve regurgitation is trivial. No aortic stenosis is present. There is a 26 mm Sapien prosthetic, stented (TAVR) valve present in the aortic position. Echo findings are consistent with normal structure and function of the aortic valve prosthesis. Pulmonic Valve: The pulmonic valve was normal in structure. Pulmonic valve regurgitation is not visualized. No evidence of pulmonic stenosis. Aorta: The aortic root is normal in size and structure. Venous: The inferior vena cava was not well visualized. IAS/Shunts: No atrial level shunt detected  by color flow Doppler. Vishnu Priya Mallipeddi Electronically signed by Diannah Late Mallipeddi Signature Date/Time: 01/05/2024/3:17:27 PM    Final    DG CHEST PORT 1 VIEW Result Date: 12/31/2023 CLINICAL DATA:  [id number] Status post PICC central line placement [id number]. EXAM: PORTABLE CHEST 1 VIEW COMPARISON:  12/28/2023. FINDINGS: Bilateral lung fields are clear. Bilateral costophrenic angles are clear. Stable cardio-mediastinal silhouette. Prosthetic aortic valve  noted. No acute osseous abnormalities. The soft tissues are within normal limits. Interval placement of right-sided PICC line with its tip overlying the cavoatrial junction region. No right pneumothorax. IMPRESSION: Interval placement of right-sided PICC line with its tip overlying the cavoatrial junction region. No pneumothorax. Electronically Signed   By: Ree Molt M.D.   On: 12/31/2023 16:18   US  EKG SITE RITE Result Date: 12/31/2023 If Site Rite image not attached, placement could not be confirmed due to current cardiac rhythm.  ECHOCARDIOGRAM COMPLETE Result Date: 12/30/2023    ECHOCARDIOGRAM REPORT   Patient Name:   Gary Carroll Date of Exam: 12/30/2023 Medical Rec #:  [id number]          Height:       74.0 in Accession #:    [id number]         Weight:       205.5 lb Date of Birth:  November 19, 1945         BSA:          2.199 m Patient Age:    77 years           BP:           122/82 mmHg Patient Gender: M                  HR:           123 bpm. Exam Location:  [facility] Procedure: 2D Echo, Cardiac Doppler, Color Doppler and Intracardiac            Opacification Agent (Both Spectral and Color Flow Doppler were            utilized during procedure). Indications:    Bacteremia R78.81  History:        Patient has prior history of Echocardiogram examinations, most                 recent 02/26/2023. CAD, Aortic Valve Disease, Arrythmias:Atrial                 Fibrillation; Risk Factors:Hypertension, Diabetes and Sleep                 Apnea.                  Aortic Valve: 26 mm Edwards Sapien prosthetic, stented (TAVR)                 valve is present in the aortic position. Procedure Date:                 02/25/23.  Sonographer:    Jayson Gaskins Referring Phys: [id number] DORN FALCON BRANCH  Sonographer Comments: Technically difficult study due to poor echo windows. IMPRESSIONS  1. Left ventricular ejection fraction, by estimation, is >75%. The left ventricle has hyperdynamic function. The left ventricle has no regional wall motion abnormalities. Left ventricular diastolic parameters are indeterminate.  2. RV not well visualized, grossly appears normal in size and function. . Right ventricular systolic function was not well visualized. The right ventricular size is not well visualized.  3. The mitral valve was not well visualized. No evidence of mitral valve regurgitation. No evidence of mitral stenosis.  4. The aortic valve was not well visualized. Aortic valve regurgitation is not visualized. No aortic stenosis is present. 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 Procedure Date: 02/25/23.  5. IVC is small suggesting low RA pressure and hypovolemia. FINDINGS  Left Ventricle: Left ventricular ejection fraction, by estimation, is >75%. The left ventricle has hyperdynamic function. The left ventricle has no regional wall motion abnormalities. Definity  contrast agent was given IV to delineate the left ventricular endocardial borders. The left ventricular internal cavity size was normal in size. There is no left ventricular hypertrophy. Left ventricular diastolic parameters are indeterminate. Right Ventricle: RV not well visualized, grossly appears normal in size and function. The right ventricular size is not well visualized. Right vetricular wall thickness was not well visualized. Right ventricular systolic function was not well visualized. Left Atrium: Left atrial size was normal in size. Right Atrium: Right atrial size  was not well visualized. Pericardium: There is no evidence of pericardial effusion. Mitral Valve: The mitral valve was not well visualized. No evidence of mitral valve regurgitation. No evidence of mitral valve stenosis. Tricuspid Valve: The tricuspid valve is not well visualized. Tricuspid valve regurgitation is trivial. No evidence of tricuspid stenosis. Aortic Valve: The aortic valve was not well visualized. Aortic valve regurgitation is not visualized. No aortic stenosis is present. Aortic valve mean gradient measures 6.0 mmHg. Aortic valve peak gradient measures 11.0 mmHg. Aortic valve area, by VTI measures 2.33 cm. 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 Procedure Date: 02/25/23. Pulmonic Valve: The pulmonic valve was not well visualized. Pulmonic valve regurgitation is not visualized. No evidence of pulmonic stenosis. Aorta: The aortic root was not well visualized. Venous: IVC is small suggesting low RA pressure and hypovolemia. IAS/Shunts: No atrial level shunt detected by color flow Doppler.  LEFT VENTRICLE PLAX 2D LVIDd:         3.60 cm LVIDs:         2.30 cm LV PW:         1.00 cm LV IVS:        1.05 cm LVOT diam:     2.00 cm LV SV:         62 LV SV Index:   28 LVOT Area:     3.14 cm  RIGHT VENTRICLE RV S prime:     16.40 cm/s TAPSE (M-mode): 2.4 cm LEFT ATRIUM           Index LA Vol (A4C): 40.2 ml 18.28 ml/m  AORTIC VALVE AV Area (Vmax):    2.32 cm AV Area (Vmean):   2.10 cm AV Area (VTI):     2.33 cm AV Vmax:           165.50 cm/s AV Vmean:          118.000 cm/s AV VTI:            0.267 m AV Peak Grad:      11.0 mmHg AV Mean Grad:      6.0 mmHg LVOT Vmax:         122.00 cm/s LVOT Vmean:        78.700 cm/s LVOT VTI:          0.198 m LVOT/AV VTI ratio: 0.74 MITRAL VALVE MV Area (PHT): 6.32 cm    SHUNTS MV Decel Time: 120 msec    Systemic VTI:  0.20 m MV E velocity: 96.10 cm/s  Systemic Diam: 2.00 cm Dorn Ross MD Electronically signed by  Dorn Ross MD Signature Date/Time: 12/30/2023/10:24:20 AM    Final    DG Chest Port 1 View Result Date: 12/28/2023 CLINICAL DATA:  Questionable sepsis - evaluate for abnormality EXAM: PORTABLE CHEST - 1 VIEW COMPARISON:  Sep 22, 2023 FINDINGS: Low lung volumes. Patchy retrocardiac airspace opacities. No pleural effusion or pneumothorax. Moderate cardiomegaly. 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 No acute fracture or destructive lesions. Multilevel thoracic osteophytosis. Osteopenia. IMPRESSION: Patchy retrocardiac airspace opacities, likely atelectasis in the setting of low lung volumes. Alternatively, a developing bronchopneumonia could have this appearance in the correct clinical context. Electronically Signed   By: Rogelia Myers M.D.   On: 12/28/2023 12:41     I discussed the limitations of evaluation and management by telemedicine. The patient expressed understanding and agreed to proceed.   I discussed the assessment and treatment plan with the patient. The patient was provided an opportunity to ask questions and all were answered. The patient agreed with the plan and demonstrated an understanding of the instructions.   The patient was advised to call back or seek an in-person evaluation if the symptoms worsen or if the condition fails to improve as anticipated.  I spent 35 minutes involved in face-to-face and non-face-to-face activities for this patient on the day of the visit. Professional time spent includes the following activities: Preparing to see the patient (review of tests), Obtaining and reviewing separately obtained history (hospitalist progress note from today), Performing a medically appropriate examination and evaluation , Ordering medications, referring and communicating with other health care professionals including Cardiology and Pharmacy, Documenting clinical information in the EMR, Independently interpreting results (not  separately reported), and Care coordination (not separately reported).   Plan d/w requesting provider as well as ID pharm D  Of note, portions of this note may have been created with voice recognition software. While this note has been edited for accuracy, occasional wrong-word or 'sound-a-like' substitutions may have occurred due to the inherent limitations of voice recognition software.   Electronically signed by:   Annalee Orem, MD Infectious Disease Physician [facility] Pager: [phone]

## 2024-01-06 ENCOUNTER — Encounter (HOSPITAL_COMMUNITY): Payer: Self-pay | Admitting: Internal Medicine

## 2024-01-06 DIAGNOSIS — N401 Enlarged prostate with lower urinary tract symptoms: Secondary | ICD-10-CM

## 2024-01-06 DIAGNOSIS — R3914 Feeling of incomplete bladder emptying: Secondary | ICD-10-CM

## 2024-01-06 MED ORDER — METFORMIN HCL ER 500 MG PO TB24: 500.0000 mg | ORAL_TABLET | Freq: Every day | ORAL | Status: DC

## 2024-01-06 MED ORDER — DILTIAZEM HCL ER COATED BEADS 240 MG PO CP24: 240.0000 mg | ORAL_CAPSULE | Freq: Every day | ORAL | Status: DC

## 2024-01-06 MED ORDER — ZINC SULFATE 220 (50 ZN) MG PO CAPS: 220.0000 mg | ORAL_CAPSULE | Freq: Every day | ORAL | Status: DC

## 2024-01-06 MED ORDER — MEDIHONEY WOUND/BURN DRESSING EX PSTE: 1.0000 | PASTE | Freq: Every day | CUTANEOUS | Status: DC

## 2024-01-06 MED ORDER — LEVALBUTEROL HCL 0.63 MG/3ML IN NEBU: 0.6300 mg | INHALATION_SOLUTION | Freq: Four times a day (QID) | RESPIRATORY_TRACT | Status: DC | PRN

## 2024-01-06 MED ORDER — ZINC OXIDE 40 % EX OINT: TOPICAL_OINTMENT | Freq: Two times a day (BID) | CUTANEOUS | Status: DC

## 2024-01-06 MED ORDER — ADULT MULTIVITAMIN W/MINERALS CH
1.0000 | ORAL_TABLET | Freq: Every day | ORAL | Status: AC
Start: 2024-01-06 — End: ?

## 2024-01-06 MED ORDER — AMOXICILLIN 250 MG PO CAPS
1000.0000 mg | ORAL_CAPSULE | Freq: Three times a day (TID) | ORAL | Status: DC
  Administered 2024-01-06 – 2024-01-07 (×4): 1000 mg via ORAL
  Filled 2024-01-06 (×4): qty 4

## 2024-01-06 MED ORDER — METOPROLOL TARTRATE 25 MG PO TABS: 25.0000 mg | ORAL_TABLET | Freq: Two times a day (BID) | ORAL | Status: DC

## 2024-01-06 MED ORDER — SENNOSIDES-DOCUSATE SODIUM 8.6-50 MG PO TABS: 1.0000 | ORAL_TABLET | Freq: Every evening | ORAL | Status: AC | PRN

## 2024-01-06 MED ORDER — AMOXICILLIN 500 MG PO CAPS: 1000.0000 mg | ORAL_CAPSULE | Freq: Three times a day (TID) | ORAL | Status: AC

## 2024-01-06 MED ORDER — SACCHAROMYCES BOULARDII 250 MG PO CAPS: 250.0000 mg | ORAL_CAPSULE | Freq: Two times a day (BID) | ORAL | Status: AC

## 2024-01-06 MED ORDER — IPRATROPIUM BROMIDE 0.02 % IN SOLN
0.5000 mg | Freq: Four times a day (QID) | RESPIRATORY_TRACT | Status: AC | PRN
Start: 2024-01-06 — End: ?

## 2024-01-06 MED ORDER — ACETAMINOPHEN 325 MG PO TABS: 650.0000 mg | ORAL_TABLET | Freq: Four times a day (QID) | ORAL | Status: AC | PRN

## 2024-01-06 NOTE — Progress Notes (Signed)
 Spoke with Tonna, RN at [facility] and provided report. Waiting on EMS to transfer

## 2024-01-06 NOTE — TOC Transition Note (Signed)
 Transition of Care [facility]) - Discharge Note   Patient Details  Name: Gary Carroll MRN: [id number] Date of Birth: Sep 18, 1945  Transition of Care [facility]) CM/SW Contact:  Lucie Lunger, LCSWA Phone Number: 01/06/2024, 12:59 PM   Clinical Narrative:    CSW spoke to [place] with [facility] who states that they have no bed open for pt due to plumbing issues, [facility] states they can offer a bed at [facility] (sister facility) for pt until bed at [facility] opens. CSW spoke to [place], pts POA, about this, she is agreeable and states they will accept this bed until [facility] can take pt. CSW updated D/C will happen today. CSW sent D/C clinicals to facility via HUB. CSW provided RN with room and report. Med necessity printed to floor. EMS to be called when RN is ready. TOC signing off.   Final next level of care: Skilled Nursing Facility Barriers to Discharge: Barriers Resolved   Patient Goals and CMS Choice Patient states their goals for this hospitalization and ongoing recovery are:: go to SNF CMS Medicare.gov Compare Post Acute Care list provided to:: Patient Choice offered to / list presented to : Patient, [facility] POA / Guardian      Discharge Placement                Patient to be transferred to facility by: EMS Name of family member notified: florence (POA) Patient and family notified of of transfer: 01/06/24  Discharge Plan and Services Additional resources added to the After Visit Summary for   In-house Referral: Clinical Social Work Discharge Planning Services: CM Consult Post Acute Care Choice: Skilled Nursing Facility                               Social Drivers of Health (SDOH) Interventions SDOH Screenings   Food Insecurity: Patient Unable To Answer (12/28/2023)  Housing: Patient Unable To Answer (12/28/2023)  Transportation Needs: Patient Unable To Answer (12/28/2023)  Utilities: Patient Unable To Answer (12/28/2023)  Alcohol Screen: Low Risk  (02/12/2023)   Depression (PHQ2-9): Low Risk  (10/10/2023)  Recent Concern: Depression (PHQ2-9) - Medium Risk (09/26/2023)  Financial Resource Strain: Low Risk  (09/26/2023)  Physical Activity: Inactive (09/26/2023)  Social Connections: Patient Unable To Answer (12/28/2023)  Stress: No Stress Concern Present (09/26/2023)  Tobacco Use: Medium Risk (01/05/2024)  Health Literacy: Adequate Health Literacy (03/07/2023)     Readmission Risk Interventions    12/29/2023    8:50 AM 09/24/2023    2:43 PM 02/27/2023   11:39 AM  Readmission Risk Prevention Plan  Transportation Screening Complete Complete Complete  PCP or Specialist Appt within 3-5 Days  Not Complete   HRI or Home Care Consult   Complete  Social Work Consult for Recovery Care Planning/Counseling  Complete Complete  Palliative Care Screening  Not Applicable Not Applicable  Medication Review Oceanographer) Complete Complete Complete  HRI or Home Care Consult Complete    SW Recovery Care/Counseling Consult Complete    Palliative Care Screening Not Applicable    Skilled Nursing Facility Complete

## 2024-01-06 NOTE — Care Management Important Message (Signed)
 Important Message  Patient Details  Name: Gary Carroll MRN: [id number] Date of Birth: [birth year]   Important Message Given:  Yes - Medicare IM     Ram Haugan L Katrinia Straker 01/06/2024, 11:37 AM

## 2024-01-06 NOTE — Progress Notes (Signed)
 Pt continuing to refuse wound care, educated patient on importance of changing dressing and that he may discharge tomorrow and it is important for staff to do wound care and prepare patient for potential discharge. Will attempt dressing changes before end of shift.

## 2024-01-06 NOTE — Discharge Summary (Signed)
 Physician Discharge Summary  Gary Faraone Carroll FMW:[id number] DOB: [birth year] DOA: 12/28/2023  PCP: [facility]  Admit date: 12/28/2023 Discharge date: 01/06/2024  Admitted From: [facility] Disposition: [facility]  Recommendations for Outpatient Follow-up:  Follow up with PCP in 1-2 weeks Please obtain BMP/CBC in 2 weeks Please complete oral amoxicillin  course thru 01/13/24 Please monitor blood sugars per facility protocol  Recommend outpatient palliative medicine referral   Discharge Condition: STABLE   CODE STATUS: FULL DIET:  regular  Brief Hospitalization Summary: Please see all hospital notes, images, labs for full details of the hospitalization. Admission provider HPI:  Gary Carroll is a 78 year old with extensive history of severe aortic stenosis s/p TAVR, paroxysmal A-fib on Eliquis , CAD, rectal adenocarcinoma, HTN, HLD, DM2, chronic heart failure HFpEF, presented to ED from nursing home with chief complaint of generalized weaknesses, fever, confusion.  Patient is poor historian at this point-electronic record review reveals patient has had multiple hospitalization for multiple comorbidities last discharged on 09/30/2023 after treatment for sepsis, UTI secondary to E. coli, Enterobacter, E. coli bacteremia COVID infection in April, E. coli sepsis.  Labs: CMP potassium 5.3, BUN 28, creatinine 1.54, albumin 2.6, AST 63 ALT 47, lactic acid 5.0 CBC WBC 15.3, hemoglobin 11.9, neutrophil 15.0, INR 2.4, Respiratory panel-influenza A, B, COVID-all negative UA amber, moderate leukocytes, negative for nitrites, bacteria none, WBC > 50  Chest x-ray:  Patchy retrocardiac airspace opacities, likely atelectasis in the setting of low lung volumes. Alternatively, a developing bronchopneumonia could have this appearance in the correct clinical context.  Requested patient to be admitted, as patient meets sepsis criteria-likely source UTI with low probability of  pneumonia  Sepsis protocol initiated, IV fluids, broad-spectrum antibiotics cultures been obtained  Hospital Course by listed problems addressed  Group C Strep Sepsis - Sepsis physiology resolved  Group C Strep Bacteremia  -urinary source, UTI -Initiated sepsis protocol, aggressive IV fluid resuscitation, IV penicillin  per ID and now transitioned to oral amox -blood and urine cultures--Group C Strep positive infection -Lactic acidosis-  LA 5.0 trended down to 1.7 with treatments  -TEE Monday 01/05/24 -- NEGATIVE FOR VEGETATIONS -per ID, he will complete amoxicillin  1000 mg TID thru 01/13/24   Adjustment disorder with depressed mood - treated supportively, outpatient follow up    Type 2 diabetes mellitus, controlled -resume metformin  ER at discharge, reduced does to once daily  -Checking CBG q. ACHS, SSI coverage -Check an A1c: 4.6% (last A1c 4 months ago was 6.1%)   Disease of anus and rectum, unspecified - History of rectal adenocarcinoma with chronic fissure - Continue wound care - pt has been resistant to allowing nurses to do wound care at times - No signs of infection   Insomnia - Continue home medication melatonin - As needed trazodone  in hospital only   Morbid obesity Body mass index is 34.17 kg/m. 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 Stacia   (HFpEF) heart failure with preserved ejection fraction  - POA euvolemic - Last Echo 12/ 2024 EF 60-65%, normal LV function moderate concentric LVH normal right ventricular systolic function, normal mitral and tricuspid valve repair replaced aortic valve - monitored I's and O's, daily  weight    Intake/Output Summary (Last 24 hours) at 01/05/2024 1229 Last data filed at 01/05/2024 9147    Gross per 24 hour  Intake 363 ml  Output 2650 ml  Net -2287 ml    CAD (coronary artery disease) - Currently stable - Continue home medication including beta-blocker, statins, apixaban  calcium  channel   Aortic stenosis - History of severe aortic stenosis, s/p TAVR Successful TAVR with 26 mm Edwards SAPIEN 3 THV on 02/25/2023   Elevated transaminase level - resolved now - Likely exacerbated by sepsis, on statins - treated supportively   Benign prostatic hyperplasia with lower urinary tract symptoms Acute urinary retention -Temporary Foley catheter initially placed, then was removed but needed to be replaced due to recurrent urinary retention -Resuming home meds including oxybutynin  -plan is to continue foley cath for now and follow up with urologist outpatient for foley removal and voiding trial -ambulatory referral to urology requested for foley removal and voiding trial    AF (paroxysmal atrial fibrillation)  - Rate controlled with diltiazem , metoprolol , apixaban    Gastroesophageal reflux disease without esophagitis - Continue PPI   AKI (acute kidney injury) -- Resolved - Monitoring BUN/creatinine closely likely elevated due to sepsis, hypovolemia hypotension - Avoid nephrotoxins, hypotension - treated with IVF   Hyperlipidemia, unspecified - on statins -holding rosuvastatin  d/t transaminitis   Essential hypertension -BPs have been well controlled on current regimen   Hypokalemia -- check Mg -- 1.6 -- repleted    Hypomagnesemia -- IV replacement ordered  -- repleted    Discharge Diagnoses:  Principal Problem:   Septic shock (HCC) Active Problems:   Essential hypertension   Hyperlipidemia, unspecified   AKI (acute kidney injury) (HCC)   Gastroesophageal reflux disease without esophagitis   AF (paroxysmal atrial fibrillation) (HCC)   Benign prostatic hyperplasia  with lower urinary tract symptoms   Urinary tract infection   Elevated transaminase level   Aortic stenosis   CAD (coronary artery disease)   (HFpEF) heart failure with preserved ejection fraction (HCC)   S/P TAVR (transcatheter aortic valve replacement)   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 with unspecified severity (HCC)   Morbid obesity (HCC)   Insomnia   Disease of anus and rectum, unspecified   Chronic venous insufficiency   Complication of diabetes mellitus (HCC)   Sepsis secondary to UTI (HCC)   Adjustment disorder with depressed mood   Sepsis (HCC)   Hypotensive episode   Streptococcal bacteremia   Lactic acidosis   Discharge Instructions: Discharge Instructions     Ambulatory referral to Urology   Complete by: As directed    Hospital follow up urinary retention for foley removal and voiding trial      Allergies as of 01/06/2024       Reactions   Diovan [valsartan] Cough   Tagamet [cimetidine] Other (See Comments)   Irritability         Medication List     STOP taking these medications    losartan  50 MG tablet Commonly known as: COZAAR    metFORMIN  500 MG tablet Commonly known as: GLUCOPHAGE  Replaced by: metFORMIN  500 MG 24 hr tablet   potassium chloride  10 MEQ tablet Commonly known as: KLOR-CON        TAKE these medications    acetaminophen  325 MG tablet Commonly known as: TYLENOL  Take 2 tablets (650 mg total) by mouth every 6 (six) hours as needed for mild pain (pain score 1-3), moderate pain (pain score 4-6), fever or headache (or  Fever >/= 101).   amoxicillin  500 MG capsule Commonly known as: AMOXIL  Take 2 capsules (1,000 mg total) by mouth every 8 (eight) hours for 7 days.   apixaban  5 MG Tabs tablet Commonly known as: ELIQUIS  Take 1 tablet (5 mg total) by mouth 2 (two) times daily.   ascorbic acid  500 MG tablet Commonly known as: VITAMIN C  Take 500 mg by mouth daily.   clotrimazole -betamethasone   cream Commonly known as: LOTRISONE  Apply 1 Application topically in the morning, at noon, and at bedtime. To groin/scrotum/perineum for dermatitis   diltiazem  240 MG 24 hr capsule Commonly known as: Cardizem  CD Take 1 capsule (240 mg total) by mouth daily. What changed:  medication strength how much to take   dutasteride  0.5 MG capsule Commonly known as: AVODART  Take 0.5 mg by mouth daily.   ipratropium 0.02 % nebulizer solution Commonly known as: ATROVENT  Take 2.5 mLs (0.5 mg total) by nebulization every 6 (six) hours as needed for wheezing.   leptospermum manuka honey Pste paste Apply 1 Application topically daily. Cleanse coccyx/buttocks with Vashe, apply Medihoney to wound beds daily and cover with dry gauze and silicone foam or ABD pad whichever is preferred. Interchangeable with TheraHoney Apply thin layer (3 mm) to wound.   levalbuterol  0.63 MG/3ML nebulizer solution Commonly known as: XOPENEX  Take 3 mLs (0.63 mg total) by nebulization every 6 (six) hours as needed for shortness of breath or wheezing.   liver oil-zinc  oxide 40 % ointment Commonly known as: DESITIN Apply topically 2 (two) times daily. Apply a thin layer of Desitin to intact skin of buttocks 2 times daily and prn soiling   Magnesium  500 MG Caps Take 500 mg by mouth daily.   melatonin 5 MG Tabs Take 10 mg by mouth at bedtime.   metFORMIN  500 MG 24 hr tablet Commonly known as: GLUCOPHAGE -XR Take 1 tablet (500 mg total) by mouth daily with breakfast. Replaces: metFORMIN  500 MG tablet   metoprolol  tartrate 25 MG tablet Commonly known as: LOPRESSOR  Take 1 tablet (25 mg total) by mouth 2 (two) times daily. What changed:  medication strength how much to take   multivitamin with minerals Tabs tablet Take 1 tablet by mouth daily.   Nutritional Supplement Liqd Take 120 mLs by mouth with breakfast, with lunch, and with evening meal. House Supplement   oxybutynin  5 MG tablet Commonly known as:  DITROPAN  Take 5 mg by mouth at bedtime.   rosuvastatin  20 MG tablet Commonly known as: CRESTOR  Take 20 mg by mouth daily.   saccharomyces boulardii 250 MG capsule Commonly known as: FLORASTOR Take 1 capsule (250 mg total) by mouth 2 (two) times daily.   senna-docusate 8.6-50 MG tablet Commonly known as: Senokot-S Take 1 tablet by mouth at bedtime as needed for mild constipation.   zinc  sulfate (50mg  elemental zinc ) 220 (50 Zn) MG capsule Take 1 capsule (220 mg total) by mouth daily.        Contact information for after-discharge care     Destination     [facility] .   Service: Skilled Nursing Contact information: [address] [place]  [phone]                    Allergies  Allergen Reactions   Diovan [Valsartan] Cough   Tagamet [Cimetidine] Other (See Comments)    Irritability    Allergies as of 01/06/2024       Reactions   Diovan [valsartan] Cough  Tagamet [cimetidine] Other (See Comments)   Irritability         Medication List     STOP taking these medications    losartan  50 MG tablet Commonly known as: COZAAR    metFORMIN  500 MG tablet Commonly known as: GLUCOPHAGE  Replaced by: metFORMIN  500 MG 24 hr tablet   potassium chloride  10 MEQ tablet Commonly known as: KLOR-CON        TAKE these medications    acetaminophen  325 MG tablet Commonly known as: TYLENOL  Take 2 tablets (650 mg total) by mouth every 6 (six) hours as needed for mild pain (pain score 1-3), moderate pain (pain score 4-6), fever or headache (or Fever >/= 101).   amoxicillin  500 MG capsule Commonly known as: AMOXIL  Take 2 capsules (1,000 mg total) by mouth every 8 (eight) hours for 7 days.   apixaban  5 MG Tabs tablet Commonly known as: ELIQUIS  Take 1 tablet (5 mg total) by mouth 2 (two) times daily.   ascorbic acid  500 MG tablet Commonly known as: VITAMIN C  Take 500 mg by mouth daily.    clotrimazole -betamethasone  cream Commonly known as: LOTRISONE  Apply 1 Application topically in the morning, at noon, and at bedtime. To groin/scrotum/perineum for dermatitis   diltiazem  240 MG 24 hr capsule Commonly known as: Cardizem  CD Take 1 capsule (240 mg total) by mouth daily. What changed:  medication strength how much to take   dutasteride  0.5 MG capsule Commonly known as: AVODART  Take 0.5 mg by mouth daily.   ipratropium 0.02 % nebulizer solution Commonly known as: ATROVENT  Take 2.5 mLs (0.5 mg total) by nebulization every 6 (six) hours as needed for wheezing.   leptospermum manuka honey Pste paste Apply 1 Application topically daily. Cleanse coccyx/buttocks with Vashe, apply Medihoney to wound beds daily and cover with dry gauze and silicone foam or ABD pad whichever is preferred. Interchangeable with TheraHoney Apply thin layer (3 mm) to wound.   levalbuterol  0.63 MG/3ML nebulizer solution Commonly known as: XOPENEX  Take 3 mLs (0.63 mg total) by nebulization every 6 (six) hours as needed for shortness of breath or wheezing.   liver oil-zinc  oxide 40 % ointment Commonly known as: DESITIN Apply topically 2 (two) times daily. Apply a thin layer of Desitin to intact skin of buttocks 2 times daily and prn soiling   Magnesium  500 MG Caps Take 500 mg by mouth daily.   melatonin 5 MG Tabs Take 10 mg by mouth at bedtime.   metFORMIN  500 MG 24 hr tablet Commonly known as: GLUCOPHAGE -XR Take 1 tablet (500 mg total) by mouth daily with breakfast. Replaces: metFORMIN  500 MG tablet   metoprolol  tartrate 25 MG tablet Commonly known as: LOPRESSOR  Take 1 tablet (25 mg total) by mouth 2 (two) times daily. What changed:  medication strength how much to take   multivitamin with minerals Tabs tablet Take 1 tablet by mouth daily.   Nutritional Supplement Liqd Take 120 mLs by mouth with breakfast, with lunch, and with evening meal. House Supplement   oxybutynin  5 MG  tablet Commonly known as: DITROPAN  Take 5 mg by mouth at bedtime.   rosuvastatin  20 MG tablet Commonly known as: CRESTOR  Take 20 mg by mouth daily.   saccharomyces boulardii 250 MG capsule Commonly known as: FLORASTOR Take 1 capsule (250 mg total) by mouth 2 (two) times daily.   senna-docusate 8.6-50 MG tablet Commonly known as: Senokot-S Take 1 tablet by mouth at bedtime as needed for mild constipation.   zinc  sulfate (50mg  elemental zinc ) 220 (  50 Zn) MG capsule Take 1 capsule (220 mg total) by mouth daily.        Procedures/Studies: ECHO TEE Result Date: 01/05/2024    TRANSESOPHOGEAL ECHO REPORT   Patient Name:   Gary Carroll Date of Exam: 01/05/2024 Medical Rec #:  [id number]          Height:       74.0 in Accession #:    [id number]         Weight:       205.2 lb Date of Birth:  02/28/1946         BSA:          2.198 m Patient Age:    77 years           BP:           112/64 mmHg Patient Gender: M                  HR:           66 bpm. Ex[name] Procedure: Transesophageal Echo, Cardiac Doppler and Color Doppler (Both            Spectral and Color Flow Doppler were utilized during procedure). Indications:    Bacteremia R78.81  History:        Patient has prior history of Echocardiogram examinations, most                 recent 12/30/2023. CAD, Aortic Valve Disease, Arrythmias:Atrial                 Fibrillation; Risk Factors:Hypertension, Diabetes, Dyslipidemia                 and Sleep Apnea. Aortic Valve: 26 mm Edwards Sapien prosthetic,                 stented (TAVR) valve is present in the aortic position.                 Procedure Date: 02/25/23.                  Aortic Valve: 26 mm Sapien prosthetic, stented (TAVR) valve is                 present in the aortic position.  Sonographer:    Aida Pizza RCS Referring Phys: [id number] LAYMON CHRISTELLA QUA PROCEDURE: The transesophogeal probe was passed without difficulty through the esophogus of the patient. Imaged were  obtained with the patient in a left lateral decubitus position. Sedation performed by different physician. Image quality was good. The patient's vital signs; including heart rate, blood pressure, and oxygen  saturation; remained stable throughout the procedure. Supplementary images were obtained from transthoracic windows as indicated to answer the clinical question. The patient developed no complications during the procedure.  IMPRESSIONS  1. Left ventricular ejection fraction, by estimation, is 60 to 65%. The left ventricle has normal function. The left ventricle has no regional wall motion abnormalities. There is mild asymmetric left ventricular hypertrophy of the septal and basal segments. Left ventricular diastolic function could not be evaluated.  2. Right ventricular systolic function is normal. The right ventricular size is normal. Tricuspid regurgitation signal is inadequate for assessing PA pressure.  3. No left atrial/left atrial appendage thrombus was detected.  4. The mitral valve is normal in structure. Trivial mitral valve regurgitation. No evidence of mitral stenosis.  5. The aortic valve has been repaired/replaced. Aortic valve regurgitation is  trivial. No aortic stenosis is present. There is a 26 mm Sapien prosthetic (TAVR) valve present in the aortic position. Echo findings are consistent with normal structure and function of the aortic valve prosthesis. Conclusion(s)/Recommendation(s): No evidence of vegetation/infective endocarditis on this transesophageael echocardiogram. FINDINGS  Left Ventricle: Left ventricular ejection fraction, by estimation, is 60 to 65%. The left ventricle has normal function. The left ventricle has no regional wall motion abnormalities. The left ventricular internal cavity size was normal in size. There is  mild asymmetric left ventricular hypertrophy of the septal and basal segments. Left ventricular diastolic function could not be evaluated. Right Ventricle: The right  ventricular size is normal. No increase in right ventricular wall thickness. Right ventricular systolic function is normal. Tricuspid regurgitation signal is inadequate for assessing PA pressure. Left Atrium: Left atrial size was not assessed. No left atrial/left atrial appendage thrombus was detected. Right Atrium: Right atrial size was not assessed. Pericardium: There is no evidence of pericardial effusion. Mitral Valve: The mitral valve is normal in structure. Trivial mitral valve regurgitation. No evidence of mitral valve stenosis. Tricuspid Valve: The tricuspid valve is normal in structure. Tricuspid valve regurgitation is mild . No evidence of tricuspid stenosis. Aortic Valve: The aortic valve has been repaired/replaced. Aortic valve regurgitation is trivial. No aortic stenosis is present. There is a 26 mm Sapien prosthetic, stented (TAVR) valve present in the aortic position. Echo findings are consistent with normal structure and function of the aortic valve prosthesis. Pulmonic Valve: The pulmonic valve was normal in structure. Pulmonic valve regurgitation is not visualized. No evidence of pulmonic stenosis. Aorta: The aortic root is normal in size and structure. Venous: The inferior vena cava was not well visualized. IAS/Shunts: No atrial level shunt detected by color flow Doppler. Vishnu Priya Mallipeddi Electronically signed by Diannah Late Mallipeddi Signature Date/Time: 01/05/2024/3:17:27 PM    Final    DG CHEST PORT 1 VIEW Result Date: 12/31/2023 CLINICAL DATA:  222480 Status post PICC central line placement [id number]. EXAM: PORTABLE CHEST 1 VIEW COMPARISON:  12/28/2023. FINDINGS: Bilateral lung fields are clear. Bilateral costophrenic angles are clear. Stable cardio-mediastinal silhouette. Prosthetic aortic valve noted. No acute osseous abnormalities. The soft tissues are within normal limits. Interval placement of right-sided PICC line with its tip overlying the cavoatrial junction region. No right  pneumothorax. IMPRESSION: Interval placement of right-sided PICC line with its tip overlying the cavoatrial junction region. No pneumothorax. Electronically Signed   By: Ree Molt M.D.   On: 12/31/2023 16:18   US  EKG SITE RITE Result Date: 12/31/2023 If Site Rite image not attached, placement could not be confirmed due to current cardiac rhythm.  ECHOCARDIOGRAM COMPLETE Result Date: 12/30/2023    ECHOCARDIOGRAM REPORT   Patient Name:   Gary Carroll Date of Exam: 12/30/2023 Medical Rec #:  [id number]          Height:       74.0 in Accession #:    [id number]         Weight:       205.5 lb Date of Birth:  1946/02/09         BSA:          2.199 m Patient Age:    77 years           BP:           122/82 mmHg Patient Gender: M                  HR:  123 bpm. Exam Location:  [name] Procedure: 2D Echo, Cardiac Doppler, Color Doppler and Intracardiac            Opacification Agent (Both Spectral and Color Flow Doppler were            utilized during procedure). Indications:    Bacteremia R78.81  History:        Patient has prior history of Echocardiogram examinations, most                 recent 02/26/2023. CAD, Aortic Valve Disease, Arrythmias:Atrial                 Fibrillation; Risk Factors:Hypertension, Diabetes and Sleep                 Apnea.                 Aortic Valve: 26 mm Edwards Sapien prosthetic, stented (TAVR)                 valve is present in the aortic position. Procedure Date:                 02/25/23.  Sonographer:    Jayson Gaskins Referring Phys: [id number] DORN FALCON BRANCH  Sonographer Comments: Technically difficult study due to poor echo windows. IMPRESSIONS  1. Left ventricular ejection fraction, by estimation, is >75%. The left ventricle has hyperdynamic function. The left ventricle has no regional wall motion abnormalities. Left ventricular diastolic parameters are indeterminate.  2. RV not well visualized, grossly appears normal in size and function. . Right ventricular  systolic function was not well visualized. The right ventricular size is not well visualized.  3. The mitral valve was not well visualized. No evidence of mitral valve regurgitation. No evidence of mitral stenosis.  4. The aortic valve was not well visualized. Aortic valve regurgitation is not visualized. No aortic stenosis is present. 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 Procedure Date: 02/25/23.  5. IVC is small suggesting low RA pressure and hypovolemia. FINDINGS  Left Ventricle: Left ventricular ejection fraction, by estimation, is >75%. The left ventricle has hyperdynamic function. The left ventricle has no regional wall motion abnormalities. Definity  contrast agent was given IV to delineate the left ventricular endocardial borders. The left ventricular internal cavity size was normal in size. There is no left ventricular hypertrophy. Left ventricular diastolic parameters are indeterminate. Right Ventricle: RV not well visualized, grossly appears normal in size and function. The right ventricular size is not well visualized. Right vetricular wall thickness was not well visualized. Right ventricular systolic function was not well visualized. Left Atrium: Left atrial size was normal in size. Right Atrium: Right atrial size was not well visualized. Pericardium: There is no evidence of pericardial effusion. Mitral Valve: The mitral valve was not well visualized. No evidence of mitral valve regurgitation. No evidence of mitral valve stenosis. Tricuspid Valve: The tricuspid valve is not well visualized. Tricuspid valve regurgitation is trivial. No evidence of tricuspid stenosis. Aortic Valve: The aortic valve was not well visualized. Aortic valve regurgitation is not visualized. No aortic stenosis is present. Aortic valve mean gradient measures 6.0 mmHg. Aortic valve peak gradient measures 11.0 mmHg. Aortic valve area, by VTI measures 2.33 cm. There is a 26 mm  Edwards Sapien prosthetic, stented (TAVR) valve present in the aortic position. Procedure Date: 02/25/23. Pulmonic Valve: The pulmonic valve was not well visualized. Pulmonic valve regurgitation is not visualized.  No evidence of pulmonic stenosis. Aorta: The aortic root was not well visualized. Venous: IVC is small suggesting low RA pressure and hypovolemia. IAS/Shunts: No atrial level shunt detected by color flow Doppler.  LEFT VENTRICLE PLAX 2D LVIDd:         3.60 cm LVIDs:         2.30 cm LV PW:         1.00 cm LV IVS:        1.05 cm LVOT diam:     2.00 cm LV SV:         62 LV SV Index:   28 LVOT Area:     3.14 cm  RIGHT VENTRICLE RV S prime:     16.40 cm/s TAPSE (M-mode): 2.4 cm LEFT ATRIUM           Index LA Vol (A4C): 40.2 ml 18.28 ml/m  AORTIC VALVE AV Area (Vmax):    2.32 cm AV Area (Vmean):   2.10 cm AV Area (VTI):     2.33 cm AV Vmax:           165.50 cm/s AV Vmean:          118.000 cm/s AV VTI:            0.267 m AV Peak Grad:      11.0 mmHg AV Mean Grad:      6.0 mmHg LVOT Vmax:         122.00 cm/s LVOT Vmean:        78.700 cm/s LVOT VTI:          0.198 m LVOT/AV VTI ratio: 0.74 MITRAL VALVE MV Area (PHT): 6.32 cm    SHUNTS MV Decel Time: 120 msec    Systemic VTI:  0.20 m MV E velocity: 96.10 cm/s  Systemic Diam: 2.00 cm Dorn Ross MD Electronically signed by Dorn Ross MD Signature Date/Time: 12/30/2023/10:24:20 AM    Final    DG Chest Port 1 View Result Date: 12/28/2023 CLINICAL DATA:  Questionable sepsis - evaluate for abnormality EXAM: PORTABLE CHEST - 1 VIEW COMPARISON:  Sep 22, 2023 FINDINGS: Low lung volumes. Patchy retrocardiac airspace opacities. No pleural effusion or pneumothorax. Moderate cardiomegaly. 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 No acute fracture or destructive lesions. Multilevel thoracic osteophytosis. Osteopenia. IMPRESSION: Patchy retrocardiac airspace opacities, likely atelectasis in the setting of low lung volumes.  Alternatively, a developing bronchopneumonia could have this appearance in the correct clinical context. Electronically Signed   By: Rogelia Myers M.D.   On: 12/28/2023 12:41     Subjective: Pt reports he will participate with rehab, he is eager to get back to [facility].  No specific complaints today.   Discharge Exam: Vitals:   01/05/24 2210 01/06/24 0602  BP: 120/63 110/83  Pulse: 88 94  Resp: 19 18  Temp: 97.6 F (36.4 C) 97.7 F (36.5 C)  SpO2: 97% 92%   Vitals:   01/05/24 1453 01/05/24 2210 01/06/24 0500 01/06/24 0602  BP: 100/63 120/63  110/83  Pulse: 72 88  94  Resp:  19  18  Temp: 98.6 F (37 C) 97.6 F (36.4 C)  97.7 F (36.5 C)  TempSrc: Oral Oral  Oral  SpO2: 100% 97%  92%  Weight:   92 kg   Height:       General exam: Appears calm and comfortable  Respiratory system: Clear to auscultation. Respiratory effort normal. Cardiovascular system: normal S1 & S2 heard. No JVD, murmurs, rubs,  gallops or clicks. No pedal edema. Gastrointestinal system: Abdomen is nondistended, soft and nontender. No organomegaly or masses felt. Normal bowel sounds heard. Central nervous system: Alert and oriented. No focal neurological deficits. Extremities: 1-2+ edema BLEs Symmetric 5 x 5 power. Skin: No rashes, lesions or ulcers. Psychiatry: Judgement and insight appear normal. Mood & affect appropriate.    The results of significant diagnostics from this hospitalization (including imaging, microbiology, ancillary and laboratory) are listed below for reference.     Microbiology: Recent Results (from the past 240 hours)  Resp panel by RT-PCR (RSV, Flu A&B, Covid) Anterior Nasal Swab     Status: None   Collection Time: 12/28/23 11:52 AM   Specimen: Anterior Nasal Swab  Result Value Ref Range Status   SARS Coronavirus 2 by RT PCR NEGATIVE NEGATIVE Final    Comment: (NOTE) SARS-CoV-2 target nucleic acids are NOT DETECTED.  The SARS-CoV-2 RNA is generally detectable in upper  respiratory specimens during the acute phase of infection. The lowest concentration of SARS-CoV-2 viral copies this assay can detect is 138 copies/mL. A negative result does not preclude SARS-Cov-2 infection and should not be used as the sole basis for treatment or other patient management decisions. A negative result may occur with  improper specimen collection/handling, submission of specimen other than nasopharyngeal swab, presence of viral mutation(s) within the areas targeted by this assay, and inadequate number of viral copies(<138 copies/mL). A negative result must be combined with clinical observations, patient history, and epidemiological information. The expected result is Negative.  Fact Sheet for Patients:  BloggerCourse.com  Fact Sheet for Healthcare Providers:  SeriousBroker.it  This test is no t yet approved or cleared by the [place]  FDA and  has been authorized for detection and/or diagnosis of SARS-CoV-2 by FDA under an Emergency Use Authorization (EUA). This EUA will remain  in effect (meaning this test can be used) for the duration of the COVID-19 declaration under Section 564(b)(1) of the Act, 21 U.S.C.section 360bbb-3(b)(1), unless the authorization is terminated  or revoked sooner.       Influenza A by PCR NEGATIVE NEGATIVE Final   Influenza B by PCR NEGATIVE NEGATIVE Final    Comment: (NOTE) The Xpert Xpress SARS-CoV-2/FLU/RSV plus assay is intended as an aid in the diagnosis of influenza from Nasopharyngeal swab specimens and should not be used as a sole basis for treatment. Nasal washings and aspirates are unacceptable for Xpert Xpress SARS-CoV-2/FLU/RSV testing.  Fact Sheet for Patients: BloggerCourse.com  Fact Sheet for Healthcare Providers: SeriousBroker.it  This test is not yet approved or cleared by the [place]  FDA and has been  authorized for detection and/or diagnosis of SARS-CoV-2 by FDA under an Emergency Use Authorization (EUA). This EUA will remain in effect (meaning this test can be used) for the duration of the COVID-19 declaration under Section 564(b)(1) of the Act, 21 U.S.C. section 360bbb-3(b)(1), unless the authorization is terminated or revoked.     Resp Syncytial Virus by PCR NEGATIVE NEGATIVE Final    Comment: (NOTE) Fact Sheet for Patients: BloggerCourse.com  Fact Sheet for Healthcare Providers: SeriousBroker.it  This test is not yet approved or cleared by the [place]  FDA and has been authorized for detection and/or diagnosis of SARS-CoV-2 by FDA under an Emergency Use Authorization (EUA). This EUA will remain in effect (meaning this test can be used) for the duration of the COVID-19 declaration under Section 564(b)(1) of the Act, 21 U.S.C. section 360bbb-3(b)(1), unless the authorization is terminated or revoked.  Performed at  [facility], [address], [place], [place] [address]   Urine Culture     Status: Abnormal   Collection Time: 12/28/23 11:53 AM   Specimen: Urine, Random  Result Value Ref Range Status   Specimen Description   Final    URINE, RANDOM Performed at [facility], [address], [place], [place] [address]    Special Requests   Final    NONE Reflexed from [id number] Performed at [facility], [address] [place], [place] [address]    Culture 40,000 COLONIES/mL YEAST (A)  Final   Report Status 12/31/2023 FINAL  Final  Blood Culture (routine x 2)     Status: Abnormal   Collection Time: 12/28/23 12:25 PM   Specimen: Right Antecubital; Blood  Result Value Ref Range Status   Specimen Description   Final    RIGHT ANTECUBITAL BOTTLES DRAWN AEROBIC AND ANAEROBIC Performed at [facility], [address], [place], [place] [address]    Special Requests   Final    Blood Culture adequate volume Performed at [facility], [address], [place], [place] [address]    Culture  Setup Time   Final    GRAM POSITIVE COCCI IN BOTH AEROBIC AND ANAEROBIC BOTTLES Gram Stain Report Called to,Read Back By and Verified With: C. KINDLEY ON 12/28/2023 @8 :20PM BY T.HAMER  Performed at [facility], [address] [place], [place] [address]    Culture (A)  Final    STREPTOCOCCUS GROUP C SUSCEPTIBILITIES PERFORMED ON PREVIOUS CULTURE WITHIN THE LAST 5 DAYS. Performed at [facility], [address], [place], [place] [address]    Report Status 12/30/2023 FINAL  Final  Blood Culture (routine x 2)     Status: Abnormal   Collection Time: 12/28/23 12:25 PM   Specimen: Left Antecubital; Blood  Result Value Ref Range Status   Specimen Description   Final    LEFT ANTECUBITAL BOTTLES DRAWN AEROBIC AND ANAEROBIC Performed at [facility], [address] [place], [place] [address]    Special Requests   Final    Blood Culture adequate volume Performed at [facility], [address] [place], [place] [address]    Culture  Setup Time   Final    GRAM POSITIVE COCCI Gram Stain Report Called to,Read Back By and Verified With: KINDLEY,C @ 2020 ON 12/28/23 BY [facility] IN BOTH AEROBIC AND ANAEROBIC BOTTLES GS DONE @ [facility] CRITICAL RESULT CALLED TO, READ BACK BY AND VERIFIED WITHBETHA JAYSON LINGER RN 12/29/2023 @ 0010 BY AB Performed at [facility], [address] [place], [place] [address]    Culture STREPTOCOCCUS GROUP C (A)  Final   Report Status 12/30/2023 FINAL  Final   Organism ID, Bacteria STREPTOCOCCUS GROUP C  Final      Susceptibility   Streptococcus group c - MIC*    CLINDAMYCIN RESISTANT Resistant     AMPICILLIN <=0.25 SENSITIVE Sensitive     ERYTHROMYCIN >=8 RESISTANT Resistant     VANCOMYCIN  0.5 SENSITIVE Sensitive     CEFTRIAXONE  <=0.12 SENSITIVE Sensitive     LEVOFLOXACIN 0.5 SENSITIVE Sensitive     PENICILLIN  <=0.06 SENSITIVE Sensitive     * STREPTOCOCCUS GROUP C  Blood Culture ID Panel (Reflexed)      Status: Abnormal   Collection Time: 12/28/23 12:25 PM  Result Value Ref Range Status   Enterococcus faecalis NOT DETECTED NOT DETECTED Final   Enterococcus Faecium NOT DETECTED NOT DETECTED Final   Listeria monocytogenes NOT DETECTED NOT DETECTED  Final   Staphylococcus species NOT DETECTED NOT DETECTED Final   Staphylococcus aureus (BCID) NOT DETECTED NOT DETECTED Final   Staphylococcus epidermidis NOT DETECTED NOT DETECTED Final   Staphylococcus lugdunensis NOT DETECTED NOT DETECTED Final   Streptococcus species DETECTED (A) NOT DETECTED Final    Comment: Not Enterococcus species, Streptococcus agalactiae, Streptococcus pyogenes, or Streptococcus pneumoniae. CRITICAL RESULT CALLED TO, READ BACK BY AND VERIFIED WITH: C KINDLEY RN 12/29/2023 @ 0010 BY AB    Streptococcus agalactiae NOT DETECTED NOT DETECTED Final   Streptococcus pneumoniae NOT DETECTED NOT DETECTED Final   Streptococcus pyogenes NOT DETECTED NOT DETECTED Final   A.calcoaceticus-baumannii NOT DETECTED NOT DETECTED Final   Bacteroides fragilis NOT DETECTED NOT DETECTED Final   Enterobacterales NOT DETECTED NOT DETECTED Final   Enterobacter cloacae complex NOT DETECTED NOT DETECTED Final   Escherichia coli NOT DETECTED NOT DETECTED Final   Klebsiella aerogenes NOT DETECTED NOT DETECTED Final   Klebsiella oxytoca NOT DETECTED NOT DETECTED Final   Klebsiella pneumoniae NOT DETECTED NOT DETECTED Final   Proteus species NOT DETECTED NOT DETECTED Final   Salmonella species NOT DETECTED NOT DETECTED Final   Serratia marcescens NOT DETECTED NOT DETECTED Final   Haemophilus influenzae NOT DETECTED NOT DETECTED Final   Neisseria meningitidis NOT DETECTED NOT DETECTED Final   Pseudomonas aeruginosa NOT DETECTED NOT DETECTED Final   Stenotrophomonas maltophilia NOT DETECTED NOT DETECTED Final   Candida albicans NOT DETECTED NOT DETECTED Final   Candida auris NOT DETECTED NOT DETECTED Final   Candida glabrata NOT DETECTED NOT  DETECTED Final   Candida krusei NOT DETECTED NOT DETECTED Final   Candida parapsilosis NOT DETECTED NOT DETECTED Final   Candida tropicalis NOT DETECTED NOT DETECTED Final   Cryptococcus neoformans/gattii NOT DETECTED NOT DETECTED Final    Comment: Performed at [facility], [address], [place], [place] [address]  MRSA Next Gen by PCR, Nasal     Status: Abnormal   Collection Time: 12/28/23  2:30 PM   Specimen: Nasal Mucosa; Nasal Swab  Result Value Ref Range Status   MRSA by PCR Next Gen DETECTED (A) NOT DETECTED Final    Comment: RESULT CALLED TO, READ BACK BY AND VERIFIED WITH: A SHELTON AT 1743 ON [id number] BY S DALTON (NOTE) The GeneXpert MRSA Assay (FDA approved for NASAL specimens only), is one component of a comprehensive MRSA colonization surveillance program. It is not intended to diagnose MRSA infection nor to guide or monitor treatment for MRSA infections. Test performance is not FDA approved in patients less than 76 years old. Performed at [facility], [address] [place], [place] [address]   Culture, blood (Routine X 2) w Reflex to ID Panel     Status: None   Collection Time: 12/29/23 11:31 PM   Specimen: BLOOD  Result Value Ref Range Status   Specimen Description BLOOD LEFT ANTECUBITAL  Final   Special Requests   Final    BOTTLES DRAWN AEROBIC AND ANAEROBIC Blood Culture adequate volume   Culture   Final    NO GROWTH 5 DAYS Performed at [facility], [address], [place], [place] [address]    Report Status 01/03/2024 FINAL  Final  Culture, blood (Routine X 2) w Reflex to ID Panel     Status: None   Collection Time: 12/29/23 11:32 PM   Specimen: BLOOD LEFT HAND  Result Value Ref Range Status   Specimen Description BLOOD LEFT HAND  Final   Special Requests   Final  AEROBIC BOTTLE ONLY Blood Culture results may not be optimal due to an inadequate volume of blood received in culture bottles   Culture   Final    NO GROWTH 5 DAYS Performed at [facility], [address] [place], [place] [address]    Report Status 01/03/2024 FINAL  Final     Labs: BNP (last 3 results) Recent Labs    12/29/23 0433 12/30/23 0505 12/31/23 0228  BNP 477.0* 273.0* 286.0*   Basic Metabolic Panel: Recent Labs  Lab 12/31/23 0228 12/31/23 1430 01/01/24 0455 01/02/24 0455 01/03/24 0408 01/05/24 0434  NA 138  --  136 137 139 136  K 3.3*  --  3.6 3.7 3.7 4.0  CL 109  --  108 110 110 104  CO2 23  --  23 23 25 25   GLUCOSE 97  --  96 93 106* 99  BUN 16  --  13 8 8 9   CREATININE 0.49*  --  0.48* 0.49* 0.45* 0.48*  CALCIUM  7.9*  --  7.6* 7.7* 8.0* 8.2*  MG  --  1.6* 1.6* 1.9  --   --    Liver Function Tests: Recent Labs  Lab 12/31/23 0228 01/02/24 0455  AST 67* 48*  ALT 40 42  ALKPHOS 86 78  BILITOT 0.5 0.3  PROT 4.9* 5.0*  ALBUMIN 1.8* 1.8*   No results for input(s): LIPASE, AMYLASE in the last 168 hours. No results for input(s): AMMONIA in the last 168 hours. CBC: Recent Labs  Lab 12/31/23 0228 01/01/24 0455 01/02/24 0455 01/05/24 0434  WBC 6.3 6.2 6.6 5.6  HGB 8.6* 8.4* 8.4* 8.9*  HCT 27.2* 26.7* 26.9* 28.8*  MCV 93.8 94.7 94.7 94.4  PLT 77* 80* 92* 165   Cardiac Enzymes: No results for input(s): CKTOTAL, CKMB, CKMBINDEX, TROPONINI in the last 168 hours. BNP: Invalid input(s): POCBNP CBG: Recent Labs  Lab 12/31/23 0727 01/01/24 0729 01/05/24 1238 01/05/24 1423  GLUCAP 101* 92 105* 98   D-Dimer No results for input(s): DDIMER in the last 72 hours. Hgb A1c No results for input(s): HGBA1C in the last 72 hours. Lipid Profile No results for input(s): CHOL, HDL, LDLCALC, TRIG, CHOLHDL, LDLDIRECT in the last 72 hours. Thyroid function studies No results for input(s): TSH, T4TOTAL, T3FREE, THYROIDAB in the last 72 hours.  Invalid input(s): FREET3 Anemia work up No results for input(s): VITAMINB12, FOLATE, FERRITIN, TIBC, IRON, RETICCTPCT in the last 72  hours. Urinalysis    Component Value Date/Time   COLORURINE AMBER (A) 12/28/2023 1153   APPEARANCEUR CLOUDY (A) 12/28/2023 1153   LABSPEC 1.014 12/28/2023 1153   PHURINE 7.0 12/28/2023 1153   GLUCOSEU NEGATIVE 12/28/2023 1153   HGBUR SMALL (A) 12/28/2023 1153   BILIRUBINUR NEGATIVE 12/28/2023 1153   BILIRUBINUR neg 08/31/2012 1203   KETONESUR NEGATIVE 12/28/2023 1153   PROTEINUR 30 (A) 12/28/2023 1153   UROBILINOGEN negative 08/31/2012 1203   UROBILINOGEN 0.2 05/31/2010 1125   NITRITE NEGATIVE 12/28/2023 1153   LEUKOCYTESUR MODERATE (A) 12/28/2023 1153   Sepsis Labs Recent Labs  Lab 12/31/23 0228 01/01/24 0455 01/02/24 0455 01/05/24 0434  WBC 6.3 6.2 6.6 5.6   Microbiology Recent Results (from the past 240 hours)  Resp panel by RT-PCR (RSV, Flu A&B, Covid) Anterior Nasal Swab     Status: None   Collection Time: 12/28/23 11:52 AM   Specimen: Anterior Nasal Swab  Result Value Ref Range Status   SARS Coronavirus 2 by RT PCR NEGATIVE NEGATIVE Final    Comment: (NOTE)  SARS-CoV-2 target nucleic acids are NOT DETECTED.  The SARS-CoV-2 RNA is generally detectable in upper respiratory specimens during the acute phase of infection. The lowest concentration of SARS-CoV-2 viral copies this assay can detect is 138 copies/mL. A negative result does not preclude SARS-Cov-2 infection and should not be used as the sole basis for treatment or other patient management decisions. A negative result may occur with  improper specimen collection/handling, submission of specimen other than nasopharyngeal swab, presence of viral mutation(s) within the areas targeted by this assay, and inadequate number of viral copies(<138 copies/mL). A negative result must be combined with clinical observations, patient history, and epidemiological information. The expected result is Negative.  Fact Sheet for Patients:  BloggerCourse.com  Fact Sheet for Healthcare Providers:   SeriousBroker.it  This test is no t yet approved or cleared by the [place]  FDA and  has been authorized for detection and/or diagnosis of SARS-CoV-2 by FDA under an Emergency Use Authorization (EUA). This EUA will remain  in effect (meaning this test can be used) for the duration of the COVID-19 declaration under Section 564(b)(1) of the Act, 21 U.S.C.section 360bbb-3(b)(1), unless the authorization is terminated  or revoked sooner.       Influenza A by PCR NEGATIVE NEGATIVE Final   Influenza B by PCR NEGATIVE NEGATIVE Final    Comment: (NOTE) The Xpert Xpress SARS-CoV-2/FLU/RSV plus assay is intended as an aid in the diagnosis of influenza from Nasopharyngeal swab specimens and should not be used as a sole basis for treatment. Nasal washings and aspirates are unacceptable for Xpert Xpress SARS-CoV-2/FLU/RSV testing.  Fact Sheet for Patients: BloggerCourse.com  Fact Sheet for Healthcare Providers: SeriousBroker.it  This test is not yet approved or cleared by the [place]  FDA and has been authorized for detection and/or diagnosis of SARS-CoV-2 by FDA under an Emergency Use Authorization (EUA). This EUA will remain in effect (meaning this test can be used) for the duration of the COVID-19 declaration under Section 564(b)(1) of the Act, 21 U.S.C. section 360bbb-3(b)(1), unless the authorization is terminated or revoked.     Resp Syncytial Virus by PCR NEGATIVE NEGATIVE Final    Comment: (NOTE) Fact Sheet for Patients: BloggerCourse.com  Fact Sheet for Healthcare Providers: SeriousBroker.it  This test is not yet approved or cleared by the [place]  FDA and has been authorized for detection and/or diagnosis of SARS-CoV-2 by FDA under an Emergency Use Authorization (EUA). This EUA will remain in effect (meaning this test can be used) for  the duration of the COVID-19 declaration under Section 564(b)(1) of the Act, 21 U.S.C. section 360bbb-3(b)(1), unless the authorization is terminated or revoked.  Performed at [facility], [address], [place], [place] [address]   Urine Culture     Status: Abnormal   Collection Time: 12/28/23 11:53 AM   Specimen: Urine, Random  Result Value Ref Range Status   Specimen Description   Final    URINE, RANDOM Performed at [facility], [address] [place], [place] [address]    Special Requests   Final    NONE Reflexed from [id number] Performed at [facility], [address], [place], [place] [address]    Culture 40,000 COLONIES/mL YEAST (A)  Final   Report Status 12/31/2023 FINAL  Final  Blood Culture (routine x 2)     Status: Abnormal   Collection Time: 12/28/23 12:25 PM   Specimen: Right Antecubital; Blood  Result Value Ref Range Status   Specimen Description   Final  RIGHT ANTECUBITAL BOTTLES DRAWN AEROBIC AND ANAEROBIC Performed at [facility], [address], [place], [place] [address]    Special Requests   Final    Blood Culture adequate volume Performed at [facility], [address], [place], [place] [address]    Culture  Setup Time   Final    GRAM POSITIVE COCCI IN BOTH AEROBIC AND ANAEROBIC BOTTLES Gram Stain Report Called to,Read Back By and Verified With: C. KINDLEY ON 12/28/2023 @8 :20PM BY T.HAMER  Performed at [facility], [address], [place], [place] [address]    Culture (A)  Final    STREPTOCOCCUS GROUP C SUSCEPTIBILITIES PERFORMED ON PREVIOUS CULTURE WITHIN THE LAST 5 DAYS. Performed at [facility], [address], [place], [place] [address]    Report Status 12/30/2023 FINAL  Final  Blood Culture (routine x 2)     Status: Abnormal   Collection Time: 12/28/23 12:25 PM   Specimen: Left Antecubital; Blood  Result Value Ref Range Status   Specimen Description   Final    LEFT ANTECUBITAL BOTTLES DRAWN AEROBIC AND ANAEROBIC Performed at [facility], [address] [place], [place] [address]    Special Requests   Final    Blood Culture adequate volume Performed at [facility], [address], [place], [place] [address]    Culture  Setup Time   Final    GRAM POSITIVE COCCI Gram Stain Report Called to,Read Back By and Verified With: KINDLEY,C @ 2020 ON 12/28/23 BY [facility] IN BOTH AEROBIC AND ANAEROBIC BOTTLES GS DONE @ [facility] CRITICAL RESULT CALLED TO, READ BACK BY AND VERIFIED WITHBETHA JAYSON LINGER RN 12/29/2023 @ 0010 BY AB Performed at [facility], [address] [place], [place] [address]    Culture STREPTOCOCCUS GROUP C (A)  Final   Report Status 12/30/2023 FINAL  Final   Organism ID, Bacteria STREPTOCOCCUS GROUP C  Final      Susceptibility   Streptococcus group c - MIC*    CLINDAMYCIN RESISTANT Resistant     AMPICILLIN <=0.25 SENSITIVE Sensitive     ERYTHROMYCIN >=8 RESISTANT Resistant     VANCOMYCIN  0.5 SENSITIVE Sensitive     CEFTRIAXONE  <=0.12 SENSITIVE Sensitive     LEVOFLOXACIN 0.5 SENSITIVE Sensitive     PENICILLIN  <=0.06 SENSITIVE Sensitive     * STREPTOCOCCUS GROUP C  Blood Culture ID Panel (Reflexed)     Status: Abnormal   Collection Time: 12/28/23 12:25 PM  Result Value Ref Range Status   Enterococcus faecalis NOT DETECTED NOT DETECTED Final   Enterococcus Faecium NOT DETECTED NOT DETECTED Final   Listeria monocytogenes NOT DETECTED NOT DETECTED Final   Staphylococcus species NOT DETECTED NOT DETECTED Final   Staphylococcus aureus (BCID) NOT DETECTED NOT DETECTED Final   Staphylococcus epidermidis NOT DETECTED NOT DETECTED Final   Staphylococcus lugdunensis NOT DETECTED NOT DETECTED Final   Streptococcus species DETECTED (A) NOT DETECTED Final    Comment: Not Enterococcus species, Streptococcus agalactiae, Streptococcus pyogenes, or Streptococcus pneumoniae. CRITICAL RESULT CALLED TO, READ BACK BY AND VERIFIED WITH: C KINDLEY RN 12/29/2023 @ 0010 BY AB    Streptococcus agalactiae NOT DETECTED NOT DETECTED  Final   Streptococcus pneumoniae NOT DETECTED NOT DETECTED Final   Streptococcus pyogenes NOT DETECTED NOT DETECTED Final   A.calcoaceticus-baumannii NOT DETECTED NOT DETECTED Final   Bacteroides fragilis NOT DETECTED NOT DETECTED Final   Enterobacterales NOT DETECTED NOT DETECTED Final   Enterobacter cloacae complex NOT DETECTED NOT DETECTED Final   Escherichia  coli NOT DETECTED NOT DETECTED Final   Klebsiella aerogenes NOT DETECTED NOT DETECTED Final   Klebsiella oxytoca NOT DETECTED NOT DETECTED Final   Klebsiella pneumoniae NOT DETECTED NOT DETECTED Final   Proteus species NOT DETECTED NOT DETECTED Final   Salmonella species NOT DETECTED NOT DETECTED Final   Serratia marcescens NOT DETECTED NOT DETECTED Final   Haemophilus influenzae NOT DETECTED NOT DETECTED Final   Neisseria meningitidis NOT DETECTED NOT DETECTED Final   Pseudomonas aeruginosa NOT DETECTED NOT DETECTED Final   Stenotrophomonas maltophilia NOT DETECTED NOT DETECTED Final   Candida albicans NOT DETECTED NOT DETECTED Final   Candida auris NOT DETECTED NOT DETECTED Final   Candida glabrata NOT DETECTED NOT DETECTED Final   Candida krusei NOT DETECTED NOT DETECTED Final   Candida parapsilosis NOT DETECTED NOT DETECTED Final   Candida tropicalis NOT DETECTED NOT DETECTED Final   Cryptococcus neoformans/gattii NOT DETECTED NOT DETECTED Final    Comment: Performed at [facility], [address] [place], [place] [address]  MRSA Next Gen by PCR, Nasal     Status: Abnormal   Collection Time: 12/28/23  2:30 PM   Specimen: Nasal Mucosa; Nasal Swab  Result Value Ref Range Status   MRSA by PCR Next Gen DETECTED (A) NOT DETECTED Final    Comment: RESULT CALLED TO, READ BACK BY AND VERIFIED WITH: A SHELTON AT 1743 ON [id number] BY S DALTON (NOTE) The GeneXpert MRSA Assay (FDA approved for NASAL specimens only), is one component of a comprehensive MRSA colonization surveillance program. It is not intended to diagnose  MRSA infection nor to guide or monitor treatment for MRSA infections. Test performance is not FDA approved in patients less than 50 years old. Performed at [facility], [address] [place], [place] [address]   Culture, blood (Routine X 2) w Reflex to ID Panel     Status: None   Collection Time: 12/29/23 11:31 PM   Specimen: BLOOD  Result Value Ref Range Status   Specimen Description BLOOD LEFT ANTECUBITAL  Final   Special Requests   Final    BOTTLES DRAWN AEROBIC AND ANAEROBIC Blood Culture adequate volume   Culture   Final    NO GROWTH 5 DAYS Performed at [facility], [address] [place], [place] [address]    Report Status 01/03/2024 FINAL  Final  Culture, blood (Routine X 2) w Reflex to ID Panel     Status: None   Collection Time: 12/29/23 11:32 PM   Specimen: BLOOD LEFT HAND  Result Value Ref Range Status   Specimen Description BLOOD LEFT HAND  Final   Special Requests   Final    AEROBIC BOTTLE ONLY Blood Culture results may not be optimal due to an inadequate volume of blood received in culture bottles   Culture   Final    NO GROWTH 5 DAYS Performed at [facility], [address], [place], [place] [address]    Report Status 01/03/2024 FINAL  Final    Time coordinating discharge:  41 mins  SIGNED:  Afton Louder, MD  [facility] 01/06/2024, 9:42 AM How to contact the [facility] Attending or Consulting provider 7A - 7P or covering provider during after hours 7P -7A, for this patient?  Check the care team in [facility] and look for a) attending/consulting [facility] provider listed and b) the [facility] team listed Log into [email or web address] and use [facility]'s universal password to access. If you do not have the password, please contact the hospital operator.  Locate the [facility] provider you are looking for under [facility] and page to a number that you can be directly reached. If you still have difficulty reaching the provider, please page the [facility] (Director on Call) for the  Hospitalists listed on amion for assistance.

## 2024-01-06 NOTE — Plan of Care (Signed)
  Problem: Fluid Volume: Goal: Hemodynamic stability will improve Outcome: Progressing   Problem: Clinical Measurements: Goal: Diagnostic test results will improve Outcome: Progressing Goal: Signs and symptoms of infection will decrease Outcome: Progressing   Problem: Respiratory: Goal: Ability to maintain adequate ventilation will improve Outcome: Progressing   Problem: Clinical Measurements: Goal: Ability to maintain clinical measurements within normal limits will improve Outcome: Progressing Goal: Will remain free from infection Outcome: Progressing Goal: Diagnostic test results will improve Outcome: Progressing Goal: Respiratory complications will improve Outcome: Progressing Goal: Cardiovascular complication will be avoided Outcome: Progressing   Problem: Nutrition: Goal: Adequate nutrition will be maintained Outcome: Progressing   Problem: Elimination: Goal: Will not experience complications related to bowel motility Outcome: Progressing Goal: Will not experience complications related to urinary retention Outcome: Progressing   Problem: Pain Managment: Goal: General experience of comfort will improve and/or be controlled Outcome: Progressing   Problem: Safety: Goal: Ability to remain free from injury will improve Outcome: Progressing   Problem: Skin Integrity: Goal: Risk for impaired skin integrity will decrease Outcome: Progressing

## 2024-01-06 NOTE — Progress Notes (Signed)
 Pt did not want ot turn for sacrum dressing change or have back wiped down with CHG wipes. I used a warm blanket on patient and covered patients eyes with wash cloth to shield patient from light, also used dim lights to provide a more calm and relaxing environment in hopes patient would allow all wound care to be done. Pt let staff change Left leg dressing change, pt says to early to do dressings right now.. Will report to morning staff to try dressing change when patient is more willing and awake.

## 2024-01-07 MED ORDER — VITAMIN A 3 MG (10000 UNIT) PO CAPS
10000.0000 [IU] | ORAL_CAPSULE | Freq: Every day | ORAL | Status: DC
  Filled 2024-01-07 (×2): qty 1

## 2024-01-07 NOTE — Plan of Care (Signed)
  Problem: Fluid Volume: Goal: Hemodynamic stability will improve Outcome: Progressing   Problem: Clinical Measurements: Goal: Diagnostic test results will improve Outcome: Progressing Goal: Signs and symptoms of infection will decrease Outcome: Progressing   Problem: Respiratory: Goal: Ability to maintain adequate ventilation will improve Outcome: Progressing   Problem: Education: Goal: Knowledge of General Education information will improve Description: Including pain rating scale, medication(s)/side effects and non-pharmacologic comfort measures Outcome: Progressing   Problem: Health Behavior/Discharge Planning: Goal: Ability to manage health-related needs will improve Outcome: Progressing   Problem: Clinical Measurements: Goal: Ability to maintain clinical measurements within normal limits will improve Outcome: Progressing

## 2024-01-07 NOTE — Progress Notes (Signed)
 Nutrition Follow-up  DOCUMENTATION CODES:   Not applicable  INTERVENTION:   -Continue regular diet for widest variety of meal selections -Continue Magic cup TID with meals, each supplement provides 290 kcal and 9 grams of protein  -Continue MVI with minerals daily -Continue 500 mg vitamin C  BID -Continue 220 mg zinc  sulfate daily x 14 days -10,000 units vitamin A  daily   NUTRITION DIAGNOSIS:   Moderate Malnutrition related to chronic illness (chronic heart failure) as evidenced by mild muscle depletion, mild fat depletion, percent weight loss (8% weight loss x 3 months).  Ongoing  GOAL:   Patient will meet greater than or equal to 90% of their needs  Progressing   MONITOR:   PO intake, Supplement acceptance  REASON FOR ASSESSMENT:   Malnutrition Screening Tool    ASSESSMENT:   Pt with extensive history of severe aortic stenosis s/p TAVR, paroxysmal A-fib on Eliquis , CAD, rectal adenocarcinoma, HTN, HLD, DM2, chronic heart failure HFpEF, presented from nursing home with chief complaint of generalized weaknesses, fever, confusion.  8/18- s/p TEE- no vegetations  Reviewed I/O's: -960 ml x 245 hours and -3 L since admission  UOP: 1.8 L x 24 hours  Pt unavailable at time of visit. Attempted to speak with pt via call to hospital room phone, however, unable to reach.   Case discussed with RN, who reports plan to discharge to SNF today. She reports appetite is unchanged. At previous visit, pt consuming 50% of meals. Noted documented meal completions 50-100%.   Noted has been refusing wound care.   Reviewed wt hx; pt has experienced 6% wt loss over the past week. Suspect some wt loss may be related to fluid loss from CHF; -3 L since admission.   Medications reviewed and include vitamin C , cardizem , melatonin, MVI, zinc  sulfate, florastor, and vitamin A .   Labs reviewed: CBGS: 92-105. Vitamin A : 14.4.   Diet Order:   Diet Order             Diet regular Fluid  consistency: Thin  Diet effective now                   EDUCATION NEEDS:   No education needs have been identified at this time  Skin:  Skin Assessment: Skin Integrity Issues: Skin Integrity Issues:: DTI, Stage II, Stage III DTI: L pretibial Stage II: R buttocks Stage III: anus Other: -  Last BM:  01/06/24 (type 7)  Height:   Ht Readings from Last 1 Encounters:  12/28/23 6' 2 (1.88 m)    Weight:   Wt Readings from Last 1 Encounters:  01/07/24 92.3 kg    Ideal Body Weight:  86.4 kg  BMI:  Body mass index is 26.13 kg/m.  Estimated Nutritional Needs:   Kcal:  2200-2400  Protein:  115-130 grams  Fluid:  2.0-2.2 L    Margery ORN, RD, LDN, CDCES Registered Dietitian III Certified Diabetes Care and Education Specialist If unable to reach this RD, please use RD Inpatient group chat on secure chat between hours of 8am-4 pm daily

## 2024-02-02 ENCOUNTER — Encounter (HOSPITAL_COMMUNITY): Payer: Self-pay | Admitting: Internal Medicine

## 2024-03-01 ENCOUNTER — Ambulatory Visit: Admitting: Urology

## 2024-03-01 DIAGNOSIS — N401 Enlarged prostate with lower urinary tract symptoms: Secondary | ICD-10-CM

## 2024-03-03 ENCOUNTER — Telehealth: Payer: Self-pay | Admitting: Physician Assistant

## 2024-03-03 NOTE — Telephone Encounter (Addendum)
  [facility]   MULTIDISCIPLINARY HEART VALVE TEAM  [name] had TAVR 02/25/23 with Dr. Wonda. He has been living at a nursing home and it is difficult for him to get out to apts. He was admitted in 12/2023 for group sepsis and bacteremia. He underwent TTE 12/30/23 and TEE on 01/05/24 with normal heart function and normal TAVR with a mean gradient of 6 and no PVL. TEE with no vegetations.   He lives in a nursing home and is basically bed bound. He has NYHA class I symptoms although he does very limited activity. He follows with a cardiologist at the [facility].   KCCQ completed below.   [place] Cardiomyopathy Questionnaire     03/03/2024    1:28 PM 04/22/2023    8:33 AM 01/29/2023    4:35 PM  KCCQ-12  1 a. Ability to shower/bathe Not at all limited Not at all limited Moderately limited  1 b. Ability to walk 1 block Other, Did not do Not at all limited Moderately limited  1 c. Ability to hurry/jog Other, Did not do Other, Did not do Extremely limited  2. Edema feet/ankles/legs Never over the past 2 weeks Every morning Every morning  3. Limited by fatigue Never over the past 2 weeks All of the time All of the time  4. Limited by dyspnea Never over the past 2 weeks Never over the past 2 weeks At least once a day  5. Sitting up / on 3+ pillows Never over the past 2 weeks Never over the past 2 weeks Never over the past 2 weeks  6. Limited enjoyment of life Not limited at all Not limited at all Slightly limited  7. Rest of life w/ symptoms Completely satisfied Completely satisfied Somewhat satisfied  8 a. Participation in hobbies N/A, did not do for other reasons Did not limit at all Moderately limited  8 b. Participation in chores N/A, did not do for other reasons Did not limit at all Moderately limited  8 c. Visiting family/friends N/A, did not do for other reasons Did not limit at all Moderately limited     Gary Hummer PA-C  [facility]  Pager [phone]

## 2024-04-26 ENCOUNTER — Ambulatory Visit: Admitting: Urology

## 2024-04-30 ENCOUNTER — Emergency Department (HOSPITAL_COMMUNITY)

## 2024-04-30 ENCOUNTER — Inpatient Hospital Stay (HOSPITAL_COMMUNITY)
Admission: EM | Admit: 2024-04-30 | Discharge: 2024-05-06 | DRG: 871 | Disposition: A | Discharge status: Skilled Nursing Facility | Source: Skilled Nursing Facility | Attending: Family Medicine | Admitting: Family Medicine

## 2024-04-30 ENCOUNTER — Inpatient Hospital Stay (HOSPITAL_COMMUNITY)

## 2024-04-30 ENCOUNTER — Other Ambulatory Visit: Payer: Self-pay

## 2024-04-30 DIAGNOSIS — Z515 Encounter for palliative care: Secondary | ICD-10-CM

## 2024-04-30 DIAGNOSIS — Z952 Presence of prosthetic heart valve: Secondary | ICD-10-CM

## 2024-04-30 DIAGNOSIS — Z1611 Resistance to penicillins: Secondary | ICD-10-CM | POA: Diagnosis present

## 2024-04-30 DIAGNOSIS — L89159 Pressure ulcer of sacral region, unspecified stage: Secondary | ICD-10-CM | POA: Diagnosis not present

## 2024-04-30 DIAGNOSIS — D630 Anemia in neoplastic disease: Secondary | ICD-10-CM | POA: Diagnosis present

## 2024-04-30 DIAGNOSIS — Z1152 Encounter for screening for COVID-19: Secondary | ICD-10-CM

## 2024-04-30 DIAGNOSIS — J9601 Acute respiratory failure with hypoxia: Secondary | ICD-10-CM

## 2024-04-30 DIAGNOSIS — A4151 Sepsis due to Escherichia coli [E. coli]: Secondary | ICD-10-CM | POA: Diagnosis present

## 2024-04-30 DIAGNOSIS — I110 Hypertensive heart disease with heart failure: Secondary | ICD-10-CM | POA: Diagnosis present

## 2024-04-30 DIAGNOSIS — D696 Thrombocytopenia, unspecified: Secondary | ICD-10-CM | POA: Diagnosis present

## 2024-04-30 DIAGNOSIS — G928 Other toxic encephalopathy: Secondary | ICD-10-CM | POA: Diagnosis present

## 2024-04-30 DIAGNOSIS — Z1629 Resistance to other single specified antibiotic: Secondary | ICD-10-CM | POA: Diagnosis present

## 2024-04-30 DIAGNOSIS — R6521 Severe sepsis with septic shock: Secondary | ICD-10-CM

## 2024-04-30 DIAGNOSIS — Z7984 Long term (current) use of oral hypoglycemic drugs: Secondary | ICD-10-CM

## 2024-04-30 DIAGNOSIS — Z85048 Personal history of other malignant neoplasm of rectum, rectosigmoid junction, and anus: Secondary | ICD-10-CM

## 2024-04-30 DIAGNOSIS — E43 Unspecified severe protein-calorie malnutrition: Secondary | ICD-10-CM | POA: Diagnosis present

## 2024-04-30 DIAGNOSIS — L89150 Pressure ulcer of sacral region, unstageable: Secondary | ICD-10-CM | POA: Diagnosis present

## 2024-04-30 DIAGNOSIS — E785 Hyperlipidemia, unspecified: Secondary | ICD-10-CM | POA: Diagnosis present

## 2024-04-30 DIAGNOSIS — D509 Iron deficiency anemia, unspecified: Secondary | ICD-10-CM

## 2024-04-30 DIAGNOSIS — Z8249 Family history of ischemic heart disease and other diseases of the circulatory system: Secondary | ICD-10-CM

## 2024-04-30 DIAGNOSIS — N179 Acute kidney failure, unspecified: Secondary | ICD-10-CM | POA: Diagnosis present

## 2024-04-30 DIAGNOSIS — G929 Unspecified toxic encephalopathy: Secondary | ICD-10-CM

## 2024-04-30 DIAGNOSIS — Z66 Do not resuscitate: Secondary | ICD-10-CM | POA: Diagnosis present

## 2024-04-30 DIAGNOSIS — Z9221 Personal history of antineoplastic chemotherapy: Secondary | ICD-10-CM

## 2024-04-30 DIAGNOSIS — I5032 Chronic diastolic (congestive) heart failure: Secondary | ICD-10-CM | POA: Diagnosis present

## 2024-04-30 DIAGNOSIS — E119 Type 2 diabetes mellitus without complications: Secondary | ICD-10-CM | POA: Diagnosis present

## 2024-04-30 DIAGNOSIS — I480 Paroxysmal atrial fibrillation: Secondary | ICD-10-CM | POA: Diagnosis present

## 2024-04-30 DIAGNOSIS — I4892 Unspecified atrial flutter: Secondary | ICD-10-CM | POA: Diagnosis present

## 2024-04-30 DIAGNOSIS — Z8049 Family history of malignant neoplasm of other genital organs: Secondary | ICD-10-CM

## 2024-04-30 DIAGNOSIS — Z6828 Body mass index (BMI) 28.0-28.9, adult: Secondary | ICD-10-CM

## 2024-04-30 DIAGNOSIS — K604 Rectal fistula, unspecified: Secondary | ICD-10-CM | POA: Diagnosis not present

## 2024-04-30 DIAGNOSIS — R578 Other shock: Secondary | ICD-10-CM | POA: Diagnosis not present

## 2024-04-30 DIAGNOSIS — R6520 Severe sepsis without septic shock: Secondary | ICD-10-CM | POA: Diagnosis not present

## 2024-04-30 DIAGNOSIS — Z7901 Long term (current) use of anticoagulants: Secondary | ICD-10-CM

## 2024-04-30 DIAGNOSIS — D649 Anemia, unspecified: Secondary | ICD-10-CM | POA: Diagnosis not present

## 2024-04-30 DIAGNOSIS — Z96651 Presence of right artificial knee joint: Secondary | ICD-10-CM | POA: Diagnosis present

## 2024-04-30 DIAGNOSIS — A419 Sepsis, unspecified organism: Principal | ICD-10-CM | POA: Diagnosis present

## 2024-04-30 DIAGNOSIS — A409 Streptococcal sepsis, unspecified: Secondary | ICD-10-CM | POA: Diagnosis not present

## 2024-04-30 DIAGNOSIS — I2510 Atherosclerotic heart disease of native coronary artery without angina pectoris: Secondary | ICD-10-CM | POA: Diagnosis present

## 2024-04-30 DIAGNOSIS — I4891 Unspecified atrial fibrillation: Secondary | ICD-10-CM | POA: Diagnosis not present

## 2024-04-30 DIAGNOSIS — Z7189 Other specified counseling: Secondary | ICD-10-CM | POA: Diagnosis not present

## 2024-04-30 DIAGNOSIS — Z79899 Other long term (current) drug therapy: Secondary | ICD-10-CM

## 2024-04-30 DIAGNOSIS — R32 Unspecified urinary incontinence: Secondary | ICD-10-CM | POA: Diagnosis present

## 2024-04-30 DIAGNOSIS — Z923 Personal history of irradiation: Secondary | ICD-10-CM

## 2024-04-30 DIAGNOSIS — R809 Proteinuria, unspecified: Secondary | ICD-10-CM | POA: Diagnosis present

## 2024-04-30 DIAGNOSIS — E872 Acidosis, unspecified: Secondary | ICD-10-CM | POA: Diagnosis present

## 2024-04-30 DIAGNOSIS — Z888 Allergy status to other drugs, medicaments and biological substances status: Secondary | ICD-10-CM

## 2024-04-30 DIAGNOSIS — E876 Hypokalemia: Secondary | ICD-10-CM | POA: Diagnosis present

## 2024-04-30 DIAGNOSIS — Z711 Person with feared health complaint in whom no diagnosis is made: Secondary | ICD-10-CM | POA: Diagnosis not present

## 2024-04-30 DIAGNOSIS — I10 Essential (primary) hypertension: Secondary | ICD-10-CM | POA: Diagnosis not present

## 2024-04-30 DIAGNOSIS — Z87891 Personal history of nicotine dependence: Secondary | ICD-10-CM

## 2024-04-30 DIAGNOSIS — N390 Urinary tract infection, site not specified: Secondary | ICD-10-CM | POA: Diagnosis present

## 2024-04-30 LAB — COMPREHENSIVE METABOLIC PANEL WITH GFR
ALT: 21 U/L (ref 0–44)
ALT: 19 U/L (ref 0–44)
AST: 43 U/L — ABNORMAL HIGH (ref 15–41)
AST: 32 U/L (ref 15–41)
Albumin: 3.2 g/dL — ABNORMAL LOW (ref 3.5–5.0)
Albumin: 1.9 g/dL — ABNORMAL LOW (ref 3.5–5.0)
Alkaline Phosphatase: 119 U/L (ref 38–126)
Alkaline Phosphatase: 76 U/L (ref 38–126)
Anion gap: 16 — ABNORMAL HIGH (ref 5–15)
Anion gap: 7 (ref 5–15)
BUN: 23 mg/dL (ref 8–23)
BUN: 23 mg/dL (ref 8–23)
CO2: 20 mmol/L — ABNORMAL LOW (ref 22–32)
CO2: 21 mmol/L — ABNORMAL LOW (ref 22–32)
Calcium: 9.1 mg/dL (ref 8.9–10.3)
Calcium: 7.6 mg/dL — ABNORMAL LOW (ref 8.9–10.3)
Chloride: 105 mmol/L (ref 98–111)
Chloride: 110 mmol/L (ref 98–111)
Creatinine, Ser: 1.38 mg/dL — ABNORMAL HIGH (ref 0.61–1.24)
Creatinine, Ser: 0.89 mg/dL (ref 0.61–1.24)
GFR, Estimated: 52 mL/min — ABNORMAL LOW (ref >60)
GFR, Estimated: >60 mL/min (ref >60)
Glucose, Bld: 90 mg/dL (ref 70–99)
Glucose, Bld: 139 mg/dL — ABNORMAL HIGH (ref 70–99)
Potassium: 5 mmol/L (ref 3.5–5.1)
Potassium: 3.8 mmol/L (ref 3.5–5.1)
Sodium: 140 mmol/L (ref 135–145)
Sodium: 138 mmol/L (ref 135–145)
Total Bilirubin: 0.4 mg/dL (ref 0.0–1.2)
Total Bilirubin: 0.4 mg/dL (ref 0.0–1.2)
Total Protein: 6.6 g/dL (ref 6.5–8.1)
Total Protein: 5.4 g/dL — ABNORMAL LOW (ref 6.5–8.1)

## 2024-04-30 LAB — LACTIC ACID, PLASMA
Lactic Acid, Venous: 4.2 mmol/L (ref 0.5–1.9)
Lactic Acid, Venous: 4.7 mmol/L (ref 0.5–1.9)
Lactic Acid, Venous: 3.4 mmol/L (ref 0.5–1.9)
Lactic Acid, Venous: 2.3 mmol/L (ref 0.5–1.9)

## 2024-04-30 LAB — CBC WITH DIFFERENTIAL/PLATELET
Abs Immature Granulocytes: 0.5 K/uL — ABNORMAL HIGH (ref 0.00–0.07)
Band Neutrophils: 29 %
Basophils Absolute: 0 K/uL (ref 0.0–0.1)
Basophils Relative: 0 %
Eosinophils Absolute: 0 K/uL (ref 0.0–0.5)
Eosinophils Relative: 0 %
HCT: 36.4 % — ABNORMAL LOW (ref 39.0–52.0)
Hemoglobin: 10.9 g/dL — ABNORMAL LOW (ref 13.0–17.0)
Lymphocytes Relative: 1 %
Lymphs Abs: 0.3 K/uL — ABNORMAL LOW (ref 0.7–4.0)
MCH: 25.8 pg — ABNORMAL LOW (ref 26.0–34.0)
MCHC: 29.9 g/dL — ABNORMAL LOW (ref 30.0–36.0)
MCV: 86.3 fL (ref 80.0–100.0)
Monocytes Absolute: 0.5 K/uL (ref 0.1–1.0)
Monocytes Relative: 2 %
Myelocytes: 2 %
Neutro Abs: 26 K/uL — ABNORMAL HIGH (ref 1.7–7.7)
Neutrophils Relative %: 66 %
Platelets: 160 K/uL (ref 150–400)
RBC: 4.22 MIL/uL (ref 4.22–5.81)
RDW: 18.5 % — ABNORMAL HIGH (ref 11.5–15.5)
Smear Review: NORMAL
WBC: 27.4 K/uL — ABNORMAL HIGH (ref 4.0–10.5)
nRBC: 0 % (ref 0.0–0.2)

## 2024-04-30 LAB — URINALYSIS, W/ REFLEX TO CULTURE (INFECTION SUSPECTED)
Glucose, UA: NEGATIVE mg/dL
Ketones, ur: NEGATIVE mg/dL
Nitrite: NEGATIVE
Protein, ur: >=300 mg/dL — AB
RBC / HPF: >50 RBC/hpf (ref 0–5)
Specific Gravity, Urine: 1.024 (ref 1.005–1.030)
WBC, UA: >50 WBC/hpf (ref 0–5)
pH: 5 (ref 5.0–8.0)

## 2024-04-30 LAB — RESP PANEL BY RT-PCR (RSV, FLU A&B, COVID)  RVPGX2
Influenza A by PCR: NEGATIVE
Influenza B by PCR: NEGATIVE
Resp Syncytial Virus by PCR: NEGATIVE
SARS Coronavirus 2 by RT PCR: NEGATIVE

## 2024-04-30 LAB — CBC
HCT: 31.9 % — ABNORMAL LOW (ref 39.0–52.0)
Hemoglobin: 9.7 g/dL — ABNORMAL LOW (ref 13.0–17.0)
MCH: 26.1 pg (ref 26.0–34.0)
MCHC: 30.4 g/dL (ref 30.0–36.0)
MCV: 85.8 fL (ref 80.0–100.0)
Platelets: 130 K/uL — ABNORMAL LOW (ref 150–400)
RBC: 3.72 MIL/uL — ABNORMAL LOW (ref 4.22–5.81)
RDW: 18.4 % — ABNORMAL HIGH (ref 11.5–15.5)
WBC: 24.8 K/uL — ABNORMAL HIGH (ref 4.0–10.5)
nRBC: 0 % (ref 0.0–0.2)

## 2024-04-30 LAB — PROTIME-INR
INR: 1.7 — ABNORMAL HIGH (ref 0.8–1.2)
INR: 1.9 — ABNORMAL HIGH (ref 0.8–1.2)
Prothrombin Time: 20.7 s — ABNORMAL HIGH (ref 11.4–15.2)
Prothrombin Time: 22.3 s — ABNORMAL HIGH (ref 11.4–15.2)

## 2024-04-30 LAB — GLUCOSE, CAPILLARY
Glucose-Capillary: 135 mg/dL — ABNORMAL HIGH (ref 70–99)
Glucose-Capillary: 146 mg/dL — ABNORMAL HIGH (ref 70–99)
Glucose-Capillary: 91 mg/dL (ref 70–99)

## 2024-04-30 LAB — PHOSPHORUS: Phosphorus: 3.6 mg/dL (ref 2.5–4.6)

## 2024-04-30 LAB — CBG MONITORING, ED: Glucose-Capillary: 96 mg/dL (ref 70–99)

## 2024-04-30 LAB — MAGNESIUM: Magnesium: 1.5 mg/dL — ABNORMAL LOW (ref 1.7–2.4)

## 2024-04-30 LAB — MRSA NEXT GEN BY PCR, NASAL: MRSA by PCR Next Gen: DETECTED — AB

## 2024-04-30 MED ORDER — POLYETHYLENE GLYCOL 3350 17 G PO PACK: 17.0000 g | PACK | Freq: Every day | ORAL | Status: DC | PRN

## 2024-04-30 MED ORDER — SODIUM CHLORIDE 0.9 % IV SOLN
2.0000 g | Freq: Two times a day (BID) | INTRAVENOUS | Status: DC
  Administered 2024-04-30 – 2024-05-02 (×4): 2 g via INTRAVENOUS
  Filled 2024-04-30 (×4): qty 12.5

## 2024-04-30 MED ORDER — VANCOMYCIN HCL 2000 MG/400ML IV SOLN
2000.0000 mg | Freq: Once | INTRAVENOUS | Status: AC
  Administered 2024-04-30: 2000 mg via INTRAVENOUS
  Filled 2024-04-30: qty 400

## 2024-04-30 MED ORDER — ACETAMINOPHEN 10 MG/ML IV SOLN
1000.0000 mg | Freq: Four times a day (QID) | INTRAVENOUS | Status: AC
  Administered 2024-04-30 – 2024-05-01 (×4): 1000 mg via INTRAVENOUS
  Filled 2024-04-30 (×4): qty 100

## 2024-04-30 MED ORDER — LACTATED RINGERS IV BOLUS
500.0000 mL | Freq: Once | INTRAVENOUS | Status: AC
  Administered 2024-04-30: 500 mL via INTRAVENOUS

## 2024-04-30 MED ORDER — SODIUM CHLORIDE 0.9 % IV SOLN: INTRAVENOUS | Status: DC

## 2024-04-30 MED ORDER — SODIUM CHLORIDE 0.9 % IV BOLUS
2000.0000 mL | Freq: Once | INTRAVENOUS | Status: AC
  Administered 2024-04-30: 2000 mL via INTRAVENOUS

## 2024-04-30 MED ORDER — VANCOMYCIN HCL IN DEXTROSE 1-5 GM/200ML-% IV SOLN: 1000.0000 mg | Freq: Once | INTRAVENOUS | Status: DC

## 2024-04-30 MED ORDER — IOHEXOL 350 MG/ML SOLN
75.0000 mL | Freq: Once | INTRAVENOUS | Status: AC | PRN
  Administered 2024-04-30: 75 mL via INTRAVENOUS

## 2024-04-30 MED ORDER — MAGNESIUM SULFATE 4 GM/100ML IV SOLN
4.0000 g | Freq: Once | INTRAVENOUS | Status: AC
  Administered 2024-04-30: 4 g via INTRAVENOUS
  Filled 2024-04-30: qty 100

## 2024-04-30 MED ORDER — SODIUM CHLORIDE 0.9 % IV BOLUS
500.0000 mL | Freq: Once | INTRAVENOUS | Status: AC
  Administered 2024-04-30: 500 mL via INTRAVENOUS

## 2024-04-30 MED ORDER — CHLORHEXIDINE GLUCONATE CLOTH 2 % EX PADS
6.0000 | MEDICATED_PAD | Freq: Every day | CUTANEOUS | Status: DC
  Administered 2024-04-30 – 2024-05-06 (×6): 6 via TOPICAL

## 2024-04-30 MED ORDER — HYDROCORTISONE SOD SUC (PF) 100 MG IJ SOLR
100.0000 mg | Freq: Two times a day (BID) | INTRAMUSCULAR | Status: DC
  Administered 2024-04-30 – 2024-05-01 (×2): 100 mg via INTRAVENOUS
  Filled 2024-04-30 (×2): qty 2

## 2024-04-30 MED ORDER — SODIUM CHLORIDE 0.9 % IV SOLN
2.0000 g | Freq: Once | INTRAVENOUS | Status: AC
  Administered 2024-04-30: 2 g via INTRAVENOUS
  Filled 2024-04-30: qty 20

## 2024-04-30 MED ORDER — SODIUM CHLORIDE 0.9 % IV BOLUS
1000.0000 mL | Freq: Once | INTRAVENOUS | Status: AC
  Administered 2024-04-30: 1000 mL via INTRAVENOUS

## 2024-04-30 MED ORDER — INSULIN ASPART 100 UNIT/ML IJ SOLN
0.0000 [IU] | INTRAMUSCULAR | Status: DC
  Administered 2024-04-30 – 2024-05-01 (×4): 1 [IU] via SUBCUTANEOUS
  Administered 2024-05-01: 2 [IU] via SUBCUTANEOUS
  Administered 2024-05-01 (×2): 1 [IU] via SUBCUTANEOUS
  Administered 2024-05-05: 22:00:00 2 [IU] via SUBCUTANEOUS
  Filled 2024-04-30 (×2): qty 1
  Filled 2024-04-30: qty 2
  Filled 2024-04-30: qty 1
  Filled 2024-04-30: qty 3
  Filled 2024-04-30 (×2): qty 2
  Filled 2024-04-30: qty 1

## 2024-04-30 MED ORDER — DOCUSATE SODIUM 100 MG PO CAPS
100.0000 mg | ORAL_CAPSULE | Freq: Two times a day (BID) | ORAL | Status: DC | PRN
  Administered 2024-05-04: 10:00:00 100 mg via ORAL
  Filled 2024-04-30: qty 1

## 2024-04-30 MED ORDER — ACETAMINOPHEN 650 MG RE SUPP
650.0000 mg | Freq: Once | RECTAL | Status: DC
  Filled 2024-04-30: qty 1

## 2024-04-30 MED ORDER — NOREPINEPHRINE 4 MG/250ML-% IV SOLN
0.0000 ug/min | INTRAVENOUS | Status: DC
  Administered 2024-04-30: 15 ug/min via INTRAVENOUS
  Administered 2024-05-01: 4 ug/min via INTRAVENOUS
  Filled 2024-04-30 (×2): qty 250

## 2024-04-30 MED ORDER — HEPARIN SODIUM (PORCINE) 5000 UNIT/ML IJ SOLN
5000.0000 [IU] | Freq: Three times a day (TID) | INTRAMUSCULAR | Status: DC
  Administered 2024-04-30 – 2024-05-01 (×3): 5000 [IU] via SUBCUTANEOUS
  Filled 2024-04-30 (×3): qty 1

## 2024-04-30 MED ORDER — NOREPINEPHRINE 4 MG/250ML-% IV SOLN
0.0000 ug/min | INTRAVENOUS | Status: DC
  Administered 2024-04-30: 2 ug/min via INTRAVENOUS
  Filled 2024-04-30: qty 250

## 2024-04-30 MED ADMIN — Metronidazole IV Soln 500 MG/100ML: 500 mg | INTRAVENOUS | NDC 00338105548

## 2024-04-30 MED FILL — Metronidazole IV Soln 500 MG/100ML: 500.0000 mg | INTRAVENOUS | Qty: 100 | Status: AC

## 2024-04-30 NOTE — H&P (Signed)
 NAME:  Gary Carroll, MRN:  [id number], DOB:  05-04-1946, LOS: 0 ADMISSION DATE:  04/30/2024, CONSULTATION DATE: 12/12 REFERRING MD: Dr. Suzette Sham Penn, EDP, CHIEF COMPLAINT: Shock  History of Present Illness:  78 year old male with past medical history significant for severe aortic stenosis status post TAVR, paroxysmal atrial fibrillation on Eliquis , CAD, rectal adenocarcinoma, diabetes, and HFpEF.  He has recent admissions for multiple comorbidities including sepsis secondary to E. coli bacteremia, strep bacteremia from urinary source, and heart failure.  Transesophageal echocardiogram at the time of the bacteremia showed no vegetations.  He resides in a skilled nursing facility from where he presented to [facility] emergency department 12/12 unresponsive with fever and hypoxia.  Immediately upon arrival to the emergency department he was found to be hypotensive with systolic blood pressures in the 70s and he was started on vasopressors.  With his history concern was of course for infection.  Laboratory evaluation was significant for WBC 27.4, hemoglobin 10.9, platelets 160, lactic acid 4.2 up trended to 4.7.  Urinalysis with moderate hemoglobin, moderate leukocytes, proteinuria and many bacteria.  Blood and urine cultures were sent, empiric antibiotics were initiated.  Due to ICU needs for vasopressors PCCM was at [facility] was called and has accepted for transfer/admission.  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   Significant Hospital Events: Including procedures, antibiotic start and stop dates in addition to other pertinent events   12/12 > transferred from [facility]   Interim History / Subjective:    Objective    Blood pressure 95/69, pulse 97, temperature 99.5 F (37.5 C), temperature source Rectal, resp. rate (!) 25, height 5' 11 (1.803 m), weight 90.7 kg, SpO2 98%.        Intake/Output Summary (Last 24 hours) at 04/30/2024 1442 Last data filed at 04/30/2024 1412 Gross per 24 hour  Intake 24.33 ml  Output --  Net 24.33 ml   Filed Weights   04/30/24 1016  Weight: 90.7 kg    Examination: General: ill appearing male, alert, not in acute distress HENT: [place]/AT Lungs: Clear to auscultation  Cardiovascular: RRR, Nl S1, S2  Abdomen: Soft, NT, ND Extremities: Chronic venous stasis with dermatitis Neuro: alert, not oriented, stuttered speech GU: Anal fissure extending below scrotum oozing fecal matter and blood  Resolved problem list   Assessment and Plan   Neuro #Toxic metabolic encephalopathy in the setting of sepsis   Respiratory #Acute hypoxic respiratory failure  Likely due to high metabolic demands, slightly lethargic at times and using accessory muscles, watchful for any further ventilatory deterioration   I asked him who he wants me to talk to in case he can't make decisions and he mentioned Ms Bruna Apple. I called her and updated her on how he is doing. She mentioned that he signed forms that he is DNR. She will work on trying to get those forms. In the meantime, although the patient is disoriented, he mentioned that he would be ok with intubation or CPR if he needs them. Review of his prior notes, mention that he had talked with palliative care and  has had a hard time making such difficult decisions.    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  As well as E. coli bacteremia resistant to ampicillin, Unasyn, cefazolin , and Bactrim.  LA 43.4 <4.7<4.2  - NE infusion to keep MAP > 65.  - Empiric antibiotics with cefepime , Vanc and flagyl   - Start Solu cortef  100 BID for stress dose steroids  - Blood and urine cultures pending  - Trend lactic acid  - LR 500 ml bolus   Hx of severe aortic stenosis s/p TAVR,  paroxysmal A-fib on Eliquis  CAD - PTA Eliquis  5 mg BID, last dose this AM  - Hold in the anticipation of possible surgery   Hx of heart failure with preserved ejection fraction  Last Echo 12/ 2024 EF 60-65%, normal LV function moderate concentric LVH normal right ventricular systolic function, normal mitral and tricuspid valve repair replaced aortic valve  - Monitor I and Os and daily weight - Keep Mag > 2 and K > 4  - Repeat Echo   GI: Open draining rectal fistula  Hx of Rectal adenocarcinoma and anal fissures  Follows Dr Teresa at [facility], per the last OV note on 06/2023, patient expressed that he did not wanted to pursue surgery which would require permanent colostomy. Has been refusing anorectal exams per chart review.  - F/u CT abdomen and pelvis contracts - General surgery consulted  - Keep NPO  - Recent Eliquis  today in the morning.   ID Recent history of group C strep bacteremia resistant to clindamycin and erythromycin.  As well as E. coli bacteremia resistant to ampicillin, Unasyn, cefazolin , and Bactrim.  - Continue Vanc, cefepime  and flagyl   - F/u blood cultures and urine cultures and de-escalate    Endo  T2DM - SSI moderate   Renal Acute kidney injury likely pre-renal  Presented with Scr 1.38, BUN 23; baseline ~0.48  - Monitor Daily I/Os, Daily weight  - Maintain MAP>65 for optimal renal perfusion.  - Avoid nephrotoxic agents such as IV contrast, NSAIDs, and phosphate  containing bowel preps (FLEETS)  Heme Normocytic anemia Hgb 10.9, active bleeding was noted near the rectal fistula which has been stopped - Trend   Anticoagulation: IM heparin    Skin: Sacral decub - WOC consult     Labs   CBC: Recent Labs  Lab 04/30/24 1033  WBC 27.4*  NEUTROABS 26.0*  HGB 10.9*  HCT 36.4*  MCV 86.3  PLT 160    Basic Metabolic Panel: Recent Labs  Lab 04/30/24 1033  NA 140  K 5.0  CL 105  CO2 20*  GLUCOSE 90  BUN 23  CREATININE 1.38*  CALCIUM  9.1   GFR: Estimated Creatinine Clearance: 50.9 mL/min (A) (by C-G formula based on SCr of 1.38 mg/dL (H)). Recent Labs  Lab 04/30/24 1033 04/30/24 1135 04/30/24 1327  WBC 27.4*  --   --   LATICACIDVEN 4.2* 4.7* 3.4*    Liver Function Tests: Recent Labs  Lab 04/30/24 1033  AST 43*  ALT 21  ALKPHOS 119  BILITOT 0.4  PROT 6.6  ALBUMIN 3.2*   No results for input(s): LIPASE, AMYLASE in the last 168 hours. No results for input(s): AMMONIA in the last 168 hours.  ABG    Component Value Date/Time   PHART 7.353 02/05/2023 1649   PCO2ART 38.7 02/05/2023 1649   PO2ART 66 (L)  02/05/2023 1649   HCO3 23.9 02/05/2023 1654   TCO2 25 02/25/2023 1113   ACIDBASEDEF 2.0 02/05/2023 1654   O2SAT 50 02/05/2023 1654     Coagulation Profile: Recent Labs  Lab 04/30/24 1033  INR 1.7*    Cardiac Enzymes: No results for input(s): CKTOTAL, CKMB, CKMBINDEX, TROPONINI in the last 168 hours.  HbA1C: Hgb A1c MFr Bld  Date/Time Value Ref Range Status  01/02/2024 04:55 AM 4.6 (L) 4.8 - 5.6 % Final    Comment:    (NOTE) Diagnosis of Diabetes The following HbA1c ranges recommended by the American Diabetes Association (ADA) may be used as an aid in the diagnosis of diabetes mellitus.  Hemoglobin             Suggested A1C NGSP%              Diagnosis  <5.7                   Non Diabetic  5.7-6.4                Pre-Diabetic  >6.4                   Diabetic  <7.0                    Glycemic control for                       adults with diabetes.    08/21/2023 07:10 AM 6.1 (H) 4.8 - 5.6 % Final    Comment:    (NOTE) Pre diabetes:          5.7%-6.4%  Diabetes:              >6.4%  Glycemic control for   <7.0% adults with diabetes     CBG: Recent Labs  Lab 04/30/24 1009  GLUCAP 96    Review of Systems:   As above   Past Medical History:  He,  has a past medical history of A-fib (HCC), Allergic rhinitis, Arthritis, Asthma, Bilateral knee pain, CAD (coronary artery disease), Diabetes mellitus without complication (HCC), Diverticulosis (02/2012), Erectile dysfunction, GERD (gastroesophageal reflux disease), Hiatal hernia, HLD (hyperlipidemia), HTN (hypertension), Hydronephrosis determined by ultrasound (08/22/2015), Overactive bladder, Prediabetes, Pulmonary lesion, Ringing of ears, S/P TAVR (transcatheter aortic valve replacement) (02/25/2023), Severe aortic stenosis, Sleep apnea, Urinary frequency, Urinary urgency, and Wears glasses.   Surgical History:   Past Surgical History:  Procedure Laterality Date   ABDOMINAL AORTOGRAM W/LOWER EXTREMITY N/A 02/05/2023   Procedure: ABDOMINAL AORTOGRAM W/LOWER EXTREMITY;  Surgeon: Wendel Lurena POUR, MD;  Location: [facility] INVASIVE CV LAB;  Service: Cardiovascular;  Laterality: N/A;   CATARACT EXTRACTION W/PHACO Left 12/07/2021   Procedure: CATARACT EXTRACTION PHACO AND INTRAOCULAR LENS PLACEMENT (IOC);  Surgeon: Harrie Agent, MD;  Location: [facility] ORS;  Service: Ophthalmology;  Laterality: Left;  CDE 7.75   CATARACT EXTRACTION W/PHACO Right 12/21/2021   Procedure: CATARACT EXTRACTION PHACO AND INTRAOCULAR LENS PLACEMENT (IOC);  Surgeon: Harrie Agent, MD;  Location: [facility] ORS;  Service: Ophthalmology;  Laterality: Right;  CDE: 8.00   CHOLECYSTECTOMY     COLONOSCOPY  02/2012   Dr. Debrah: sigmoid, transverse, and ascending colon diverticulosis   COLONOSCOPY WITH PROPOFOL  N/A 01/13/2023   Procedure: COLONOSCOPY WITH PROPOFOL ;   Surgeon: Cindie Carlin POUR, DO;  Location: [facility] ENDO SUITE;  Service: Endoscopy;  Laterality: N/A;  1:00 pm, asa 3   FINGER SURGERY Right  INTRAOPERATIVE TRANSTHORACIC ECHOCARDIOGRAM N/A 02/25/2023   Procedure: INTRAOPERATIVE TRANSTHORACIC ECHOCARDIOGRAM;  Surgeon: Wonda Sharper, MD;  Location: [facility] INVASIVE CV LAB;  Service: Open Heart Surgery;  Laterality: N/A;   prostate procedure  2017   per patient, was having trouble urinating    RIGHT HEART CATH AND CORONARY ANGIOGRAPHY N/A 02/05/2023   Procedure: RIGHT HEART CATH AND CORONARY ANGIOGRAPHY;  Surgeon: Wendel Lurena POUR, MD;  Location: [facility] INVASIVE CV LAB;  Service: Cardiovascular;  Laterality: N/A;   TEE WITHOUT CARDIOVERSION N/A 01/05/2024   Procedure: ECHOCARDIOGRAM, TRANSESOPHAGEAL;  Surgeon: Stacia Diannah SQUIBB, MD;  Location: [facility] ORS;  Service: Cardiovascular;  Laterality: N/A;   TONSILLECTOMY     TOTAL KNEE ARTHROPLASTY  2012   left (Dr. Dorian)   TOTAL KNEE ARTHROPLASTY Right 06/19/2015   Procedure: TOTAL KNEE ARTHROPLASTY;  Surgeon: Marcey Raman, MD;  Location: [facility] OR;  Service: Orthopedics;  Laterality: Right;   TRANSCATHETER AORTIC VALVE REPLACEMENT, TRANSFEMORAL N/A 02/25/2023   Procedure: Transcatheter Aortic Valve Replacement, Transfemoral;  Surgeon: Wonda Sharper, MD;  Location: [facility] INVASIVE CV LAB;  Service: Open Heart Surgery;  Laterality: N/A;     Social History:   reports that he quit smoking about 14 years ago. His smoking use included cigarettes. He smoked an average of 1 pack per day. He has never used smokeless tobacco. He reports that he does not currently use alcohol after a past usage of about 1.0 - 2.0 standard drink of alcohol per week. He reports that he does not use drugs.   Family History:  His family history includes Cancer in his mother; Coronary artery disease in his father and paternal grandfather; Heart disease in his paternal grandfather; Uterine cancer in his mother. There is no history of Colon cancer,  Esophageal cancer, Stomach cancer, Rectal cancer, or Liver disease.   Allergies Allergies[1]   Home Medications  Prior to Admission medications  Medication Sig Start Date End Date Taking? Authorizing Provider  acetaminophen  (TYLENOL ) 325 MG tablet Take 2 tablets (650 mg total) by mouth every 6 (six) hours as needed for mild pain (pain score 1-3), moderate pain (pain score 4-6), fever or headache (or Fever >/= 101). 01/06/24   Johnson, [name], MD  apixaban  (ELIQUIS ) 5 MG TABS tablet Take 1 tablet (5 mg total) by mouth 2 (two) times daily. 08/24/15   Theophilus Andrews, Tully GRADE, MD  clotrimazole -betamethasone  (LOTRISONE ) cream Apply 1 Application topically in the morning, at noon, and at bedtime. To groin/scrotum/perineum for dermatitis    [provider]  diltiazem  (CARDIZEM  CD) 240 MG 24 hr capsule Take 1 capsule (240 mg total) by mouth daily. 01/06/24   Johnson, [name], MD  dutasteride  (AVODART ) 0.5 MG capsule Take 0.5 mg by mouth daily.    [provider]  ipratropium (ATROVENT ) 0.02 % nebulizer solution Take 2.5 mLs (0.5 mg total) by nebulization every 6 (six) hours as needed for wheezing. 01/06/24   Johnson, [name], MD  leptospermum manuka honey (MEDIHONEY) PSTE paste Apply 1 Application topically daily. Cleanse coccyx/buttocks with Vashe, apply Medihoney to wound beds daily and cover with dry gauze and silicone foam or ABD pad whichever is preferred. Interchangeable with TheraHoney Apply thin layer (3 mm) to wound. 01/06/24   Johnson, [name], MD  levalbuterol  (XOPENEX ) 0.63 MG/3ML nebulizer solution Take 3 mLs (0.63 mg total) by nebulization every 6 (six) hours as needed for shortness of breath or wheezing. 01/06/24   Johnson, [name], MD  liver oil-zinc  oxide (DESITIN) 40 % ointment Apply  topically 2 (two) times daily. Apply a thin layer of Desitin to intact skin of buttocks 2 times daily and prn soiling 01/06/24   Johnson, [name], MD  Magnesium  500 MG CAPS  Take 500 mg by mouth daily.    [provider]  melatonin 5 MG TABS Take 10 mg by mouth at bedtime.    [provider]  metFORMIN  (GLUCOPHAGE -XR) 500 MG 24 hr tablet Take 1 tablet (500 mg total) by mouth daily with breakfast. 01/06/24   [name] Afton CROME, MD  metoprolol  tartrate (LOPRESSOR ) 25 MG tablet Take 1 tablet (25 mg total) by mouth 2 (two) times daily. 01/06/24   [name] Afton CROME, MD  Multiple Vitamin (MULTIVITAMIN WITH MINERALS) TABS tablet Take 1 tablet by mouth daily. 01/06/24   Johnson, [name], MD  Nutritional Supplement LIQD Take 120 mLs by mouth with breakfast, with lunch, and with evening meal. House Supplement    [provider]  oxybutynin  (DITROPAN ) 5 MG tablet Take 5 mg by mouth at bedtime. 06/01/19   [provider]  rosuvastatin  (CRESTOR ) 20 MG tablet Take 20 mg by mouth daily.    [provider]  senna-docusate (SENOKOT-S) 8.6-50 MG tablet Take 1 tablet by mouth at bedtime as needed for mild constipation. 01/06/24   Johnson, [name], MD  vitamin C  (ASCORBIC ACID ) 500 MG tablet Take 500 mg by mouth daily.    [provider]  zinc  sulfate, 50mg  elemental zinc , 220 (50 Zn) MG capsule Take 1 capsule (220 mg total) by mouth daily. 01/06/24   [name] Afton CROME, MD     The patient is critically ill due to septic shock requiring vasopressor titration.  Critical care was necessary to treat or prevent imminent or life-threatening deterioration. Critical care time was spent by me on the following activities: development of a treatment plan with the patient and/or surrogate as well as nursing, discussions with consultants, evaluation of the patient's response to treatment, examination of the patient, obtaining a history from the patient or surrogate, ordering and performing treatments and interventions, ordering and review of laboratory studies, ordering and review of radiographic studies, review of telemetry data including pulse  oximetry, re-evaluation of patient's condition and participation in multidisciplinary rounds.   I personally spent 57 minutes providing critical care not including any separately billable procedures.   Zola LOISE Herter, MD [facility] 04/30/2024 5:45 PM            [1]  Allergies Allergen Reactions   Diovan [Valsartan] Cough   Tagamet [Cimetidine] Other (See Comments)    Irritability

## 2024-04-30 NOTE — Progress Notes (Signed)
 Per Dr. Zammit (EDP) -- Patient admitted to [facility] service Dr. Zaida accepting -- Plan is to transfer patient to ICU at [facility] under PCCM service per EDP  - No further role for [facility] hospitalist service at this time -Patient was neither physically seen nor evaluated by [facility] hospitalist team --Further management per EDP and PCCM service - No charge note  Rendall Carwin, MD

## 2024-04-30 NOTE — ED Provider Notes (Signed)
 [place] EMERGENCY DEPARTMENT AT [facility] Provider Note   CSN: [id number] Arrival date & time: 04/30/24  9042     Patient presents with: Code Sepsis   Gary Carroll is a 78 y.o. male.   Patient has a history of coronary artery disease, diabetes, rectal cancer.  He also has an indwelling Foley.  Patient became feverish and weak today.  The history is provided by the nursing home. No language interpreter was used.  Weakness Severity:  Severe Onset quality:  Sudden Timing:  Constant Progression:  Worsening Chronicity:  New Context: not allergies   Relieved by:  Nothing Worsened by:  Nothing Ineffective treatments:  None tried Associated symptoms: no abdominal pain   Risk factors: no anemia        Prior to Admission medications  Medication Sig Start Date End Date Taking? Authorizing Provider  acetaminophen  (TYLENOL ) 325 MG tablet Take 2 tablets (650 mg total) by mouth every 6 (six) hours as needed for mild pain (pain score 1-3), moderate pain (pain score 4-6), fever or headache (or Fever >/= 101). 01/06/24   Johnson, [name], MD  apixaban  (ELIQUIS ) 5 MG TABS tablet Take 1 tablet (5 mg total) by mouth 2 (two) times daily. 08/24/15   Theophilus Andrews, Tully GRADE, MD  clotrimazole -betamethasone  (LOTRISONE ) cream Apply 1 Application topically in the morning, at noon, and at bedtime. To groin/scrotum/perineum for dermatitis    [provider]  diltiazem  (CARDIZEM  CD) 240 MG 24 hr capsule Take 1 capsule (240 mg total) by mouth daily. 01/06/24   Johnson, [name], MD  dutasteride  (AVODART ) 0.5 MG capsule Take 0.5 mg by mouth daily.    [provider]  ipratropium (ATROVENT ) 0.02 % nebulizer solution Take 2.5 mLs (0.5 mg total) by nebulization every 6 (six) hours as needed for wheezing. 01/06/24   Johnson, [name], MD  leptospermum manuka honey (MEDIHONEY) PSTE paste Apply 1 Application topically daily. Cleanse coccyx/buttocks with Vashe, apply  Medihoney to wound beds daily and cover with dry gauze and silicone foam or ABD pad whichever is preferred. Interchangeable with TheraHoney Apply thin layer (3 mm) to wound. 01/06/24   Johnson, [name], MD  levalbuterol  (XOPENEX ) 0.63 MG/3ML nebulizer solution Take 3 mLs (0.63 mg total) by nebulization every 6 (six) hours as needed for shortness of breath or wheezing. 01/06/24   Johnson, [name], MD  liver oil-zinc  oxide (DESITIN) 40 % ointment Apply topically 2 (two) times daily. Apply a thin layer of Desitin to intact skin of buttocks 2 times daily and prn soiling 01/06/24   Johnson, [name], MD  Magnesium  500 MG CAPS Take 500 mg by mouth daily.    [provider]  melatonin 5 MG TABS Take 10 mg by mouth at bedtime.    [provider]  metFORMIN  (GLUCOPHAGE -XR) 500 MG 24 hr tablet Take 1 tablet (500 mg total) by mouth daily with breakfast. 01/06/24   [name], [name], MD  metoprolol  tartrate (LOPRESSOR ) 25 MG tablet Take 1 tablet (25 mg total) by mouth 2 (two) times daily. 01/06/24   [name] Afton CROME, MD  Multiple Vitamin (MULTIVITAMIN WITH MINERALS) TABS tablet Take 1 tablet by mouth daily. 01/06/24   Johnson, [name], MD  Nutritional Supplement LIQD Take 120 mLs by mouth with breakfast, with lunch, and with evening meal. House Supplement    [provider]  oxybutynin  (DITROPAN ) 5 MG tablet Take 5 mg by mouth at bedtime. 06/01/19   [provider]  rosuvastatin  (  CRESTOR ) 20 MG tablet Take 20 mg by mouth daily.    [provider]  senna-docusate (SENOKOT-S) 8.6-50 MG tablet Take 1 tablet by mouth at bedtime as needed for mild constipation. 01/06/24   Johnson, [name], MD  vitamin C  (ASCORBIC ACID ) 500 MG tablet Take 500 mg by mouth daily.    [provider]  zinc  sulfate, 50mg  elemental zinc , 220 (50 Zn) MG capsule Take 1 capsule (220 mg total) by mouth daily. 01/06/24   [name] Afton CROME, MD    Allergies: Diovan [valsartan] and  Tagamet [cimetidine]    Review of Systems  Unable to perform ROS: Mental status change  Gastrointestinal:  Negative for abdominal pain.  Neurological:  Positive for weakness.    Updated Vital Signs BP 91/66   Pulse 100   Temp (!) 102.2 F (39 C) (Rectal)   Resp (!) 25   Ht 5' 11 (1.803 m)   Wt 90.7 kg   SpO2 97%   BMI 27.89 kg/m   Physical Exam Vitals and nursing note reviewed.  Constitutional:      Appearance: He is well-developed.     Comments: Lethargic  HENT:     Head: Normocephalic.     Nose: Nose normal.     Mouth/Throat:     Mouth: Mucous membranes are dry.  Eyes:     General: No scleral icterus.    Conjunctiva/sclera: Conjunctivae normal.  Neck:     Thyroid: No thyromegaly.  Cardiovascular:     Rate and Rhythm: Normal rate and regular rhythm.     Heart sounds: No murmur heard.    No friction rub. No gallop.  Pulmonary:     Breath sounds: No stridor. No wheezing or rales.  Chest:     Chest wall: No tenderness.  Abdominal:     General: There is no distension.     Tenderness: There is no abdominal tenderness. There is no rebound.     Comments: Decubitus near the rectum  Musculoskeletal:        General: Normal range of motion.     Cervical back: Neck supple.  Lymphadenopathy:     Cervical: No cervical adenopathy.  Skin:    Findings: No erythema or rash.  Neurological:     Motor: No abnormal muscle tone.     Coordination: Coordination normal.     Comments: Oriented to person only  Psychiatric:        Behavior: Behavior normal.     (all labs ordered are listed, but only abnormal results are displayed) Labs Reviewed  LACTIC ACID, PLASMA - Abnormal; Notable for the following components:      Result Value   Lactic Acid, Venous 4.2 (*)    All other components within normal limits  LACTIC ACID, PLASMA - Abnormal; Notable for the following components:   Lactic Acid, Venous 4.7 (*)    All other components within normal limits  COMPREHENSIVE  METABOLIC PANEL WITH GFR - Abnormal; Notable for the following components:   CO2 20 (*)    Creatinine, Ser 1.38 (*)    Albumin 3.2 (*)    AST 43 (*)    GFR, Estimated 52 (*)    Anion gap 16 (*)    All other components within normal limits  CBC WITH DIFFERENTIAL/PLATELET - Abnormal; Notable for the following components:   WBC 27.4 (*)    Hemoglobin 10.9 (*)    HCT 36.4 (*)    MCH 25.8 (*)    MCHC 29.9 (*)  RDW 18.5 (*)    Neutro Abs 26.0 (*)    Lymphs Abs 0.3 (*)    Abs Immature Granulocytes 0.50 (*)    All other components within normal limits  PROTIME-INR - Abnormal; Notable for the following components:   Prothrombin Time 20.7 (*)    INR 1.7 (*)    All other components within normal limits  URINALYSIS, W/ REFLEX TO CULTURE (INFECTION SUSPECTED) - Abnormal; Notable for the following components:   APPearance TURBID (*)    Hgb urine dipstick MODERATE (*)    Bilirubin Urine SMALL (*)    Protein, ur >=300 (*)    Leukocytes,Ua MODERATE (*)    Bacteria, UA MANY (*)    All other components within normal limits  RESP PANEL BY RT-PCR (RSV, FLU A&B, COVID)  RVPGX2  CULTURE, BLOOD (ROUTINE X 2)  CULTURE, BLOOD (ROUTINE X 2)  URINE CULTURE  URINE CULTURE  CBC WITH DIFFERENTIAL/PLATELET  LACTIC ACID, PLASMA  LACTIC ACID, PLASMA  CBG MONITORING, ED    EKG: None  Radiology: DG Chest Port 1 View Result Date: 04/30/2024 CLINICAL DATA:  Fever EXAM: PORTABLE CHEST 1 VIEW COMPARISON:  December 31, 2023 FINDINGS: Stable cardiomediastinal silhouette. Status post transcatheter aortic valve repair. Both lungs are clear. The visualized skeletal structures are unremarkable. IMPRESSION: No active disease. Electronically Signed   By: Lynwood Landy Raddle M.D.   On: 04/30/2024 11:29     Procedures   Medications Ordered in the ED  acetaminophen  (TYLENOL ) suppository 650 mg (650 mg Rectal Not Given 04/30/24 1126)  acetaminophen  (OFIRMEV ) IV 1,000 mg (0 mg Intravenous Stopped 04/30/24 1156)  0.9  %  sodium chloride  infusion ( Intravenous New Bag/Given 04/30/24 1324)  norepinephrine  (LEVOPHED ) 4mg  in (0.016 mg/mL) premix infusion (8 mcg/min Intravenous Infusion Verify 04/30/24 1346)  cefTRIAXone  (ROCEPHIN ) 2 g in sodium chloride  0.9 % 100 mL IVPB (0 g Intravenous Stopped 04/30/24 1109)  sodium chloride  0.9 % bolus 2,000 mL (2,000 mLs Intravenous Bolus 04/30/24 1039)  sodium chloride  0.9 % bolus 1,000 mL (1,000 mLs Intravenous Bolus 04/30/24 1142)  sodium chloride  0.9 % bolus 500 mL (500 mLs Intravenous Bolus 04/30/24 1145)   CRITICAL CARE Performed by: Fairy Sermon Total critical care time: 55 minutes Critical care time was exclusive of separately billable procedures and treating other patients. Critical care was necessary to treat or prevent imminent or life-threatening deterioration. Critical care was time spent personally by me on the following activities: development of treatment plan with patient and/or surrogate as well as nursing, discussions with consultants, evaluation of patient's response to treatment, examination of patient, obtaining history from patient or surrogate, ordering and performing treatments and interventions, ordering and review of laboratory studies, ordering and review of radiographic studies, pulse oximetry and re-evaluation of patient's condition. Patient was started on a sepsis protocol.  He initially responded to fluids with a normal blood pressure MAP.  His pressure dropped again and we had to start nor epi.  I spoke with the hospitalist who prefer the patient be treated by the critical care doctor.  I spoke with Dr. Zaida and excepted the patient to be on the critical care service                                 Medical Decision Making Amount and/or Complexity of Data Reviewed Labs: ordered. Radiology: ordered.  Risk OTC drugs. Prescription drug management. Decision regarding hospitalization.   Acute sepsis  requiring norepinephrine .  Patient  will be [facility] to [facility] or [facility] under the critical care service     Final diagnoses:  Acute sepsis [facility])    ED Discharge Orders     None          Suzette Pac, MD 05/01/24 [phone]

## 2024-04-30 NOTE — Consult Note (Signed)
 reason for Consult:sepsis Referring Physician: Dr. Zaida Dunnings Gary Carroll is an 78 y.o. male.  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  He is at recent hospitalizations recently secondary to sepsis due to E. coli bacteremia   He resides in a skilled nursing facility from where he presented to [facility] emergency department 12/12 unresponsive with fever and hypoxia.  Patient was found to be septic upon arrival and underwent initiation of vasopressors.  He was transferred to [facility] for further evaluation management.  Of note patient has seen Dr. Teresa a colorectal surgeon secondary to his distal rectal adenocarcinoma.  At that time patient wanted to continue with chemotherapy with hopes for avoiding surgery and complete response.  Does not appear patient followed up with Dr. Teresa in August as planned.  Of note patient on Eliquis .  Stated to have had last dose this a.m. 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 Wonda and Dr. Lucas   Severe aortic stenosis    Sleep apnea    Urinary frequency    Urinary urgency    Wears glasses     Past Surgical History:  Procedure Laterality  Date   ABDOMINAL AORTOGRAM W/LOWER EXTREMITY N/A 02/05/2023   Procedure: ABDOMINAL AORTOGRAM W/LOWER EXTREMITY;  Surgeon: Wendel Lurena POUR, MD;  Location: [facility] INVASIVE CV LAB;  Service: Cardiovascular;  Laterality: N/A;   CATARACT EXTRACTION W/PHACO Left 12/07/2021   Procedure: CATARACT EXTRACTION PHACO AND INTRAOCULAR LENS PLACEMENT (IOC);  Surgeon: Harrie Agent, MD;  Location: [facility] ORS;  Service: Ophthalmology;  Laterality: Left;  CDE 7.75   CATARACT EXTRACTION W/PHACO Right 12/21/2021   Procedure: CATARACT EXTRACTION PHACO AND INTRAOCULAR LENS PLACEMENT (IOC);  Surgeon: Harrie Agent, MD;  Location: [facility] ORS;  Service: Ophthalmology;  Laterality: Right;  CDE: 8.00   CHOLECYSTECTOMY     COLONOSCOPY  02/2012   Dr. Debrah: sigmoid, transverse, and ascending colon diverticulosis   COLONOSCOPY WITH PROPOFOL  N/A 01/13/2023   Procedure: COLONOSCOPY WITH PROPOFOL ;  Surgeon: Cindie Dunnings POUR, DO;  Location: [facility] ENDO SUITE;  Service: Endoscopy;  Laterality: N/A;  1:00 pm, asa 3   FINGER SURGERY Right    INTRAOPERATIVE TRANSTHORACIC ECHOCARDIOGRAM N/A 02/25/2023   Procedure: INTRAOPERATIVE TRANSTHORACIC ECHOCARDIOGRAM;  Surgeon: Wonda Sharper, MD;  Location: [facility] INVASIVE CV LAB;  Service: Open Heart Surgery;  Laterality: N/A;   prostate procedure  2017   per patient, was having trouble urinating    RIGHT HEART CATH AND CORONARY ANGIOGRAPHY N/A 02/05/2023   Procedure: RIGHT HEART CATH AND CORONARY ANGIOGRAPHY;  Surgeon: Wendel Lurena POUR, MD;  Location: [facility] INVASIVE CV  LAB;  Service: Cardiovascular;  Laterality: N/A;   TEE WITHOUT CARDIOVERSION N/A 01/05/2024   Procedure: ECHOCARDIOGRAM, TRANSESOPHAGEAL;  Surgeon: Stacia Diannah SQUIBB, MD;  Location: [facility] ORS;  Service: Cardiovascular;  Laterality: N/A;   TONSILLECTOMY     TOTAL KNEE ARTHROPLASTY  2012   left (Dr. Dorian)   TOTAL KNEE ARTHROPLASTY Right 06/19/2015   Procedure: TOTAL KNEE ARTHROPLASTY;  Surgeon: Marcey Raman, MD;  Location: [facility] OR;  Service:  Orthopedics;  Laterality: Right;   TRANSCATHETER AORTIC VALVE REPLACEMENT, TRANSFEMORAL N/A 02/25/2023   Procedure: Transcatheter Aortic Valve Replacement, Transfemoral;  Surgeon: Wonda Sharper, MD;  Location: [facility] INVASIVE CV LAB;  Service: Open Heart Surgery;  Laterality: N/A;    Family History  Problem Relation Age of Onset   Uterine cancer Mother    Cancer Mother        uterine   Coronary artery disease Father    Coronary artery disease Paternal Grandfather    Heart disease Paternal Grandfather    Colon cancer Neg Hx    Esophageal cancer Neg Hx    Stomach cancer Neg Hx    Rectal cancer Neg Hx    Liver disease Neg Hx     Social History:  reports that he quit smoking about 14 years ago. His smoking use included cigarettes. He smoked an average of 1 pack per day. He has never used smokeless tobacco. He reports that he does not currently use alcohol after a past usage of about 1.0 - 2.0 standard drink of alcohol per week. He reports that he does not use drugs.  Allergies: Allergies[1]  Medications: I have reviewed the patient's current medications.  Results for orders placed or performed during the hospital encounter of 04/30/24 (from the past 48 hours)  CBG monitoring, ED     Status: None   Collection Time: 04/30/24 10:09 AM  Result Value Ref Range   Glucose-Capillary 96 70 - 99 mg/dL    Comment: Glucose reference range applies only to samples taken after fasting for at least 8 hours.  Lactic acid, plasma     Status: Abnormal   Collection Time: 04/30/24 10:33 AM  Result Value Ref Range   Lactic Acid, Venous 4.2 (HH) 0.5 - 1.9 mmol/L    Comment: Critical Value, Read Back and verified with  GANTT,E  AT 11:28AM  ON  04/30/24 BY [facility] Performed at [facility], [address] [place], [place] [address]   Comprehensive metabolic panel     Status: Abnormal   Collection Time: 04/30/24 10:33 AM  Result Value Ref Range   Sodium 140 135 - 145 mmol/L   Potassium 5.0 3.5 - 5.1  mmol/L   Chloride 105 98 - 111 mmol/L   CO2 20 (L) 22 - 32 mmol/L   Glucose, Bld 90 70 - 99 mg/dL    Comment: Glucose reference range applies only to samples taken after fasting for at least 8 hours.   BUN 23 8 - 23 mg/dL   Creatinine, Ser 8.61 (H) 0.61 - 1.24 mg/dL   Calcium  9.1 8.9 - 10.3 mg/dL   Total Protein 6.6 6.5 - 8.1 g/dL   Albumin 3.2 (L) 3.5 - 5.0 g/dL   AST 43 (H) 15 - 41 U/L   ALT 21 0 - 44 U/L   Alkaline Phosphatase 119 38 - 126 U/L   Total Bilirubin 0.4 0.0 - 1.2 mg/dL   GFR, Estimated 52 (L) >60 mL/min    Comment: (NOTE) Calculated using the CKD-EPI Creatinine Equation (2021)  Anion gap 16 (H) 5 - 15    Comment: Performed at [facility], [address] [place], [place] [address]  CBC with Differential     Status: Abnormal   Collection Time: 04/30/24 10:33 AM  Result Value Ref Range   WBC 27.4 (H) 4.0 - 10.5 K/uL   RBC 4.22 4.22 - 5.81 MIL/uL   Hemoglobin 10.9 (L) 13.0 - 17.0 g/dL   HCT 63.5 (L) 60.9 - 47.9 %   MCV 86.3 80.0 - 100.0 fL   MCH 25.8 (L) 26.0 - 34.0 pg   MCHC 29.9 (L) 30.0 - 36.0 g/dL   RDW 81.4 (H) 88.4 - 84.4 %   Platelets 160 150 - 400 K/uL   nRBC 0.0 0.0 - 0.2 %   Neutrophils Relative % 66 %   Neutro Abs 26.0 (H) 1.7 - 7.7 K/uL   Band Neutrophils 29 %   Lymphocytes Relative 1 %   Lymphs Abs 0.3 (L) 0.7 - 4.0 K/uL   Monocytes Relative 2 %   Monocytes Absolute 0.5 0.1 - 1.0 K/uL   Eosinophils Relative 0 %   Eosinophils Absolute 0.0 0.0 - 0.5 K/uL   Basophils Relative 0 %   Basophils Absolute 0.0 0.0 - 0.1 K/uL   Smear Review Normal platelet morphology    Myelocytes 2 %   Abs Immature Granulocytes 0.50 (H) 0.00 - 0.07 K/uL   Polychromasia PRESENT     Comment: Performed at [facility], [address], [place], [place] [address]  Protime-INR     Status: Abnormal   Collection Time: 04/30/24 10:33 AM  Result Value Ref Range   Prothrombin Time 20.7 (H) 11.4 - 15.2 seconds   INR 1.7 (H) 0.8 - 1.2    Comment: (NOTE) INR goal varies based  on device and disease states. Performed at [facility], [address] [place], [place] [address]   Urinalysis, w/ Reflex to Culture (Infection Suspected) -Urine, Unspecified Source     Status: Abnormal   Collection Time: 04/30/24 10:42 AM  Result Value Ref Range   Specimen Source URINE, UNSPE    Color, Urine YELLOW YELLOW   APPearance TURBID (A) CLEAR   Specific Gravity, Urine 1.024 1.005 - 1.030   pH 5.0 5.0 - 8.0   Glucose, UA NEGATIVE NEGATIVE mg/dL   Hgb urine dipstick MODERATE (A) NEGATIVE   Bilirubin Urine SMALL (A) NEGATIVE   Ketones, ur NEGATIVE NEGATIVE mg/dL   Protein, ur >=699 (A) NEGATIVE mg/dL   Nitrite NEGATIVE NEGATIVE   Leukocytes,Ua MODERATE (A) NEGATIVE   RBC / HPF >50 0 - 5 RBC/hpf   WBC, UA >50 0 - 5 WBC/hpf    Comment:        Reflex urine culture not performed if WBC <=10, OR if Squamous epithelial cells >5. If Squamous epithelial cells >5 suggest recollection.    Bacteria, UA MANY (A) NONE SEEN   Squamous Epithelial / HPF 0-5 0 - 5 /HPF   WBC Clumps PRESENT     Comment: Performed at [facility], [address] [place], [place] [address]  Resp panel by RT-PCR (RSV, Flu A&B, Covid) Anterior Nasal Swab     Status: None   Collection Time: 04/30/24 10:43 AM   Specimen: Anterior Nasal Swab  Result Value Ref Range   SARS Coronavirus 2 by RT PCR NEGATIVE NEGATIVE    Comment: (NOTE) SARS-CoV-2 target nucleic acids are NOT DETECTED.  The SARS-CoV-2 RNA is generally detectable in upper respiratory specimens during the acute phase  of infection. The lowest concentration of SARS-CoV-2 viral copies this assay can detect is 138 copies/mL. A negative result does not preclude SARS-Cov-2 infection and should not be used as the sole basis for treatment or other patient management decisions. A negative result may occur with  improper specimen collection/handling, submission of specimen other than nasopharyngeal swab, presence of viral mutation(s) within the areas  targeted by this assay, and inadequate number of viral copies(<138 copies/mL). A negative result must be combined with clinical observations, patient history, and epidemiological information. The expected result is Negative.  Fact Sheet for Patients:  bloggercourse.com  Fact Sheet for Healthcare Providers:  seriousbroker.it  This test is no t yet approved or cleared by the [place]  FDA and  has been authorized for detection and/or diagnosis of SARS-CoV-2 by FDA under an Emergency Use Authorization (EUA). This EUA will remain  in effect (meaning this test can be used) for the duration of the COVID-19 declaration under Section 564(b)(1) of the Act, 21 U.S.C.section 360bbb-3(b)(1), unless the authorization is terminated  or revoked sooner.       Influenza A by PCR NEGATIVE NEGATIVE   Influenza B by PCR NEGATIVE NEGATIVE    Comment: (NOTE) The Xpert Xpress SARS-CoV-2/FLU/RSV plus assay is intended as an aid in the diagnosis of influenza from Nasopharyngeal swab specimens and should not be used as a sole basis for treatment. Nasal washings and aspirates are unacceptable for Xpert Xpress SARS-CoV-2/FLU/RSV testing.  Fact Sheet for Patients: bloggercourse.com  Fact Sheet for Healthcare Providers: seriousbroker.it  This test is not yet approved or cleared by the [place]  FDA and has been authorized for detection and/or diagnosis of SARS-CoV-2 by FDA under an Emergency Use Authorization (EUA). This EUA will remain in effect (meaning this test can be used) for the duration of the COVID-19 declaration under Section 564(b)(1) of the Act, 21 U.S.C. section 360bbb-3(b)(1), unless the authorization is terminated or revoked.     Resp Syncytial Virus by PCR NEGATIVE NEGATIVE    Comment: (NOTE) Fact Sheet for Patients: bloggercourse.com  Fact Sheet for  Healthcare Providers: seriousbroker.it  This test is not yet approved or cleared by the [place]  FDA and has been authorized for detection and/or diagnosis of SARS-CoV-2 by FDA under an Emergency Use Authorization (EUA). This EUA will remain in effect (meaning this test can be used) for the duration of the COVID-19 declaration under Section 564(b)(1) of the Act, 21 U.S.C. section 360bbb-3(b)(1), unless the authorization is terminated or revoked.  Performed at [facility], [address] [place], [place] [address]   Lactic acid, plasma     Status: Abnormal   Collection Time: 04/30/24 11:35 AM  Result Value Ref Range   Lactic Acid, Venous 4.7 (HH) 0.5 - 1.9 mmol/L    Comment: Critical Value, Read Back and verified with  BRYANT,S  AT 12:15PM  PN  04/30/24 BY [facility] Performed at [facility], [address], [place], [place] [address]   Lactic acid, plasma     Status: Abnormal   Collection Time: 04/30/24  1:27 PM  Result Value Ref Range   Lactic Acid, Venous 3.4 (HH) 0.5 - 1.9 mmol/L    Comment: Critical value noted. Value is consistent with previously reported and called value  Performed at [facility], [address] [place], [place] [address]   MRSA Next Gen by PCR, Nasal     Status: Abnormal   Collection Time: 04/30/24  4:51 PM   Specimen: Nasal Mucosa; Nasal Swab  Result  Value Ref Range   MRSA by PCR Next Gen DETECTED (A) NOT DETECTED    Comment: (NOTE) The GeneXpert MRSA Assay (FDA approved for NASAL specimens only), is one component of a comprehensive MRSA colonization surveillance program. It is not intended to diagnose MRSA infection nor to guide or monitor treatment for MRSA infections. Test performance is not FDA approved in patients less than 76 years old. Performed at [facility], [address], [place], [place] [address]   Glucose, capillary     Status: None   Collection Time: 04/30/24  4:57 PM  Result Value Ref Range    Glucose-Capillary 91 70 - 99 mg/dL    Comment: Glucose reference range applies only to samples taken after fasting for at least 8 hours.    DG Chest Port 1 View Result Date: 04/30/2024 CLINICAL DATA:  Fever EXAM: PORTABLE CHEST 1 VIEW COMPARISON:  December 31, 2023 FINDINGS: Stable cardiomediastinal silhouette. Status post transcatheter aortic valve repair. Both lungs are clear. The visualized skeletal structures are unremarkable. IMPRESSION: No active disease. Electronically Signed   By: Lynwood Landy Raddle M.D.   On: 04/30/2024 11:29    Review of Systems Blood pressure (!) 104/54, pulse 86, temperature 99.7 F (37.6 C), temperature source Oral, resp. rate (!) 22, height 5' 11 (1.803 m), weight 90.7 kg, SpO2 96%. Physical Exam Constitutional:      Appearance: He is well-developed.     Comments: Conversant No acute distress  HENT:     Head: Normocephalic and atraumatic.  Eyes:     General: Lids are normal. No scleral icterus.    Pupils: Pupils are equal, round, and reactive to light.     Comments: Pupils are equal round and reactive No lid lag Moist conjunctiva  Neck:     Thyroid: No thyromegaly.     Trachea: No tracheal tenderness.     Comments: No cervical lymphadenopathy Cardiovascular:     Rate and Rhythm: Normal rate and regular rhythm.     Heart sounds: No murmur heard. Pulmonary:     Effort: Pulmonary effort is normal.     Breath sounds: Normal breath sounds. No wheezing or rales.  Abdominal:     Tenderness: There is no abdominal tenderness.     Hernia: No hernia is present.  Genitourinary:    Rectum: Mass and tenderness present. Abnormal anal tone.     Comments: Exophitic Fungating rectal mass, firm Musculoskeletal:     Cervical back: Normal range of motion and neck supple.  Skin:    General: Skin is warm.     Findings: No rash.     Nails: There is no clubbing.     Comments: Normal skin turgor  Neurological:     Mental Status: He is alert and oriented to person,  place, and time.     Comments: Normal gait and station  Psychiatric:        Mood and Affect: Mood normal.        Thought Content: Thought content normal.        Judgment: Judgment normal.     Comments: Appropriate affect     Assessment/Plan: 78 year old male with history of rectal carcinoma Sepsis requiring vasopressors Hypertension  A-fib CAD Diabetes  1.  Patient with what appears to be advanced colorectal/anal adenocarcinoma.  Patient likely with incontinence secondary to infiltration of the rectal mass.  CT scan read currently pending.  Do not see any fistula.  Patient does appear to have some thickening of  the bladder.  I do not feel that sepsis is coming from his colorectal mass.  I discussed with the patient that if this appears to require surgery he would be in favor of surgery at this point.  He would also be in favor of an ostomy if needed. 2.  Will follow-up CT scan read per radiology 3.  Will follow along.   Lynda Leos 04/30/2024, 6:24 PM         [1]  Allergies Allergen Reactions   Diovan [Valsartan] Cough   Tagamet [Cimetidine] Other (See Comments)    Irritability

## 2024-04-30 NOTE — Sepsis Progress Note (Signed)
 [facility] is following this Code Sepsis.

## 2024-04-30 NOTE — ED Notes (Signed)
 Pts BP is 70s systolic. Paged in patient doctor. EDP gave order to start pressors .

## 2024-04-30 NOTE — ED Triage Notes (Signed)
 Pt arrived [facility] from [facility] with c/o temp 105.0, unresponsive at first , 22 G IV placed 250 ml of NS, 2 gram of rocephin , and tylenol  unknown amount given at facility. Pt baseline at 2lpm nasal cannula now on 15 liters nonrebreather.  Wound on sacrum verbalized and cbg at facility was 81. Code Sepsis per [facility]

## 2024-04-30 NOTE — Progress Notes (Signed)
 eLink Physician-Brief Progress Note Patient Name: Gary Carroll DOB: 04-20-1946 MRN: [id number]   Date of Service  04/30/2024  HPI/Events of Note  Mg++ 1.5  eICU Interventions  Magnesium  replaced per electrolyte replacement protocol.        Shanah Guimaraes U Rhetta Cleek 04/30/2024, 10:23 PM

## 2024-04-30 NOTE — Progress Notes (Signed)
° °  PCCM transfer request    Sending physician: Dr Suzette   Sending facility: [facility]  Reason for transfer: Sepsis with shock requiring pressors   Brief case summary: 67 M presented unresponsive with fever and hypoxia concern for sepsis with septic shock due to urinary source. Noted AKI and leukocytosis on labs. CXR WNL. Blood cultures and urine cultures drawn. Per report given IV fluids.   Recommendations made prior to transfer:  Consider empiric antibiotics and CT abdomen and pelvis if able to perform   Transfer accepted: yes/no Yes    Zola LOISE Herter 04/30/2024 2:09 PM [facility]  For contact information, see Amion. If no response to pager, please call PCCM consult pager. After hours, 7PM- 7AM, please call Elink.

## 2024-04-30 NOTE — ED Notes (Addendum)
 [facility] called to transport patient. Nurse made aware

## 2024-05-01 ENCOUNTER — Inpatient Hospital Stay (HOSPITAL_COMMUNITY)

## 2024-05-01 ENCOUNTER — Encounter (HOSPITAL_COMMUNITY): Payer: Self-pay

## 2024-05-01 DIAGNOSIS — L89159 Pressure ulcer of sacral region, unspecified stage: Secondary | ICD-10-CM

## 2024-05-01 DIAGNOSIS — Z7901 Long term (current) use of anticoagulants: Secondary | ICD-10-CM

## 2024-05-01 DIAGNOSIS — R6521 Severe sepsis with septic shock: Secondary | ICD-10-CM | POA: Diagnosis not present

## 2024-05-01 DIAGNOSIS — G928 Other toxic encephalopathy: Secondary | ICD-10-CM

## 2024-05-01 DIAGNOSIS — E872 Acidosis, unspecified: Secondary | ICD-10-CM

## 2024-05-01 DIAGNOSIS — D649 Anemia, unspecified: Secondary | ICD-10-CM

## 2024-05-01 DIAGNOSIS — J9601 Acute respiratory failure with hypoxia: Secondary | ICD-10-CM | POA: Diagnosis not present

## 2024-05-01 DIAGNOSIS — A419 Sepsis, unspecified organism: Secondary | ICD-10-CM | POA: Diagnosis not present

## 2024-05-01 LAB — CBC
HCT: 33.9 % — ABNORMAL LOW (ref 39.0–52.0)
Hemoglobin: 10.1 g/dL — ABNORMAL LOW (ref 13.0–17.0)
MCH: 25.6 pg — ABNORMAL LOW (ref 26.0–34.0)
MCHC: 29.8 g/dL — ABNORMAL LOW (ref 30.0–36.0)
MCV: 85.8 fL (ref 80.0–100.0)
Platelets: 148 K/uL — ABNORMAL LOW (ref 150–400)
RBC: 3.95 MIL/uL — ABNORMAL LOW (ref 4.22–5.81)
RDW: 18.2 % — ABNORMAL HIGH (ref 11.5–15.5)
WBC: 23.2 K/uL — ABNORMAL HIGH (ref 4.0–10.5)
nRBC: 0 % (ref 0.0–0.2)

## 2024-05-01 LAB — BASIC METABOLIC PANEL WITH GFR
Anion gap: 9 (ref 5–15)
BUN: 18 mg/dL (ref 8–23)
CO2: 20 mmol/L — ABNORMAL LOW (ref 22–32)
Calcium: 7.9 mg/dL — ABNORMAL LOW (ref 8.9–10.3)
Chloride: 110 mmol/L (ref 98–111)
Creatinine, Ser: 0.71 mg/dL (ref 0.61–1.24)
GFR, Estimated: >60 mL/min (ref >60)
Glucose, Bld: 158 mg/dL — ABNORMAL HIGH (ref 70–99)
Potassium: 3.9 mmol/L (ref 3.5–5.1)
Sodium: 139 mmol/L (ref 135–145)

## 2024-05-01 LAB — GLUCOSE, CAPILLARY
Glucose-Capillary: 112 mg/dL — ABNORMAL HIGH (ref 70–99)
Glucose-Capillary: 137 mg/dL — ABNORMAL HIGH (ref 70–99)
Glucose-Capillary: 140 mg/dL — ABNORMAL HIGH (ref 70–99)
Glucose-Capillary: 143 mg/dL — ABNORMAL HIGH (ref 70–99)
Glucose-Capillary: 146 mg/dL — ABNORMAL HIGH (ref 70–99)
Glucose-Capillary: 166 mg/dL — ABNORMAL HIGH (ref 70–99)

## 2024-05-01 LAB — PHOSPHORUS: Phosphorus: 4.7 mg/dL — ABNORMAL HIGH (ref 2.5–4.6)

## 2024-05-01 LAB — MAGNESIUM: Magnesium: 2.9 mg/dL — ABNORMAL HIGH (ref 1.7–2.4)

## 2024-05-01 MED ORDER — METOPROLOL TARTRATE 25 MG PO TABS
25.0000 mg | ORAL_TABLET | Freq: Two times a day (BID) | ORAL | Status: DC
  Administered 2024-05-01: 25 mg via ORAL
  Filled 2024-05-01: qty 1

## 2024-05-01 MED ORDER — ENOXAPARIN SODIUM 40 MG/0.4ML IJ SOSY
40.0000 mg | PREFILLED_SYRINGE | INTRAMUSCULAR | Status: DC
  Administered 2024-05-01: 40 mg via SUBCUTANEOUS
  Filled 2024-05-01: qty 0.4

## 2024-05-01 MED ADMIN — Metronidazole IV Soln 500 MG/100ML: 500 mg | INTRAVENOUS | NDC 00338105548

## 2024-05-01 NOTE — Plan of Care (Signed)
°  Problem: Clinical Measurements: Goal: Ability to maintain clinical measurements within normal limits will improve Outcome: Progressing   Problem: Clinical Measurements: Goal: Will remain free from infection Outcome: Progressing   Problem: Clinical Measurements: Goal: Diagnostic test results will improve Outcome: Progressing   Problem: Clinical Measurements: Goal: Respiratory complications will improve Outcome: Progressing   Problem: Clinical Measurements: Goal: Cardiovascular complication will be avoided Outcome: Progressing   Problem: Safety: Goal: Ability to remain free from injury will improve Outcome: Progressing   Problem: Skin Integrity: Goal: Risk for impaired skin integrity will decrease Outcome: Progressing   Plan of care, assessment, monitoring, treatment, and intervention (s) ongoing, see MAR see flowsheet

## 2024-05-01 NOTE — Progress Notes (Signed)
 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  Wants to eat - [birth year]  Objective: Vital signs in last 24 hours: Temp:  [97.1 F (36.2 C)-102.2 F (39 C)] 97.3 F (36.3 C) (12/13 0408) Pulse Rate:  [75-132] 109 (12/13 0645) Resp:  [17-42] 26 (12/13 0645) BP: (70-129)/(50-84) 98/60 (12/13 0645) SpO2:  [82 %-100 %] 93 % (12/13 0645) Weight:  [90.7 kg-91.2 kg] 91.2 kg (12/13 0500) Last BM Date : 04/30/24  Intake/Output from previous day: 12/12 0701 - 12/13 0700 In: 3182.9 [I.V.:1255; IV Piggyback:1927.9] Out: 1850 [Urine:1850] Intake/Output this shift: No intake/output data recorded.  Physical Exam: HEENT - sclerae clear, mucous membranes moist Neck - soft Abdomen - soft, obese, non-tender  Lab Results:  Recent Labs    04/30/24 1916 05/01/24 0257  WBC 24.8* 23.2*  HGB 9.7* 10.1*  HCT 31.9* 33.9*  PLT 130* 148*   BMET Recent Labs    04/30/24 1916 05/01/24 0257  NA 138 139  K 3.8 3.9  CL 110 110  CO2 21* 20*  GLUCOSE 139* 158*  BUN 23 18  CREATININE 0.89 0.71  CALCIUM  7.6* 7.9*   PT/INR Recent Labs    04/30/24 1033 04/30/24 1916  LABPROT 20.7* 22.3*  INR 1.7* 1.9*   Comprehensive Metabolic Panel:    Component Value Date/Time   NA 139 05/01/2024 0257   NA 138  04/30/2024 1916   K 3.9 05/01/2024 0257   K 3.8 04/30/2024 1916   CL 110 05/01/2024 0257   CL 110 04/30/2024 1916   CO2 20 (L) 05/01/2024 0257   CO2 21 (L) 04/30/2024 1916   BUN 18 05/01/2024 0257   BUN 23 04/30/2024 1916   CREATININE 0.71 05/01/2024 0257   CREATININE 0.89 04/30/2024 1916   CREATININE 0.85 02/06/2012 0929   CREATININE 0.87 08/05/2011 1126   GLUCOSE 158 (H) 05/01/2024 0257   GLUCOSE 139 (H) 04/30/2024 1916   CALCIUM  7.9 (L) 05/01/2024 0257   CALCIUM  7.6 (L) 04/30/2024 1916   AST 32 04/30/2024 1916   AST 43 (H) 04/30/2024 1033   ALT 19 04/30/2024 1916   ALT 21 04/30/2024 1033   ALKPHOS 76 04/30/2024 1916   ALKPHOS 119 04/30/2024 1033   BILITOT 0.4 04/30/2024 1916   BILITOT 0.4 04/30/2024 1033   PROT 5.4 (L) 04/30/2024 1916   PROT 6.6 04/30/2024 1033   ALBUMIN 1.9 (L) 04/30/2024 1916   ALBUMIN 3.2 (L) 04/30/2024 1033    Studies/Results: CT ABDOMEN PELVIS W CONTRAST Result Date: 04/30/2024 EXAM: CT ABDOMEN AND PELVIS WITH CONTRAST 04/30/2024 06:22:10 PM TECHNIQUE: CT of the abdomen and pelvis was performed with the administration of intravenous contrast. Multiplanar reformatted images are provided for review. Automated exposure control, iterative reconstruction, and/or weight-based adjustment of the mA/kV was utilized to reduce the radiation dose to as low as reasonably  achievable. COMPARISON: Comparison study 03/14/2023. CLINICAL HISTORY: Cutaneous fistula below the scrotum and fevers, initial encounter. FINDINGS: LOWER CHEST: Lung bases show no focal infiltrate or sizable effusion. A tiny subpleural nodule is noted in the left lower lobe laterally, measuring less than 5 mm. Similar findings are noted in the right middle lobe on image number 7 of series 6. LIVER: Fatty infiltration is demonstrated. GALLBLADDER AND BILE DUCTS: The gallbladder has been surgically removed. No biliary ductal dilatation. SPLEEN: The spleen is within normal limits. PANCREAS: The pancreas is  within normal limits. ADRENAL GLANDS: Adrenal glands show a stable nodular density within the left adrenal gland unchanged from the prior exam. This is most consistent with a small benign adenoma. KIDNEYS, URETERS AND BLADDER: Kidneys demonstrate a normal enhancement pattern bilaterally. No renal calculi or obstructive changes are seen. No calculi are noted. The bladder is decompressed by a foley catheter. GI AND BOWEL: Minimal diverticular change of the colon is noted without evidence of diverticulitis. No obstructive changes of the colon are seen. The appendix is not well visualized and may have been surgically removed. No inflammatory changes to suggest appendicitis. The stomach and small bowel show no acute abnormality. PERITONEUM AND RETROPERITONEUM: No ascites. No free air. No subcutaneous fluid collection is identified to correspond with the cutaneous fistula identified. No perirectal fluid collection is seen. VASCULATURE: Aortic calcifications of the aorta are noted. LYMPH NODES: Retroperitoneal lymph nodes are seen, similar to that noted in 2024. REPRODUCTIVE ORGANS: The prostate is within normal limits. BONES AND SOFT TISSUES: No acute osseous abnormality. No focal soft tissue abnormality. IMPRESSION: 1. No acute findings in the abdomen or pelvis related to the cutaneous fistula below the scrotum. No subcutaneous or perirectal fluid collection identified. 2. Scattered tiny subpleural pulmonary nodules at the lung bases measure less than 5 mm; no routine follow-up imaging is recommended per Fleischner Society Guidelines. Electronically signed by: Oneil Devonshire MD 04/30/2024 07:23 PM EST RP Workstation: [id number]   DG Chest Port 1 View Result Date: 04/30/2024 CLINICAL DATA:  Fever EXAM: PORTABLE CHEST 1 VIEW COMPARISON:  December 31, 2023 FINDINGS: Stable cardiomediastinal silhouette. Status post transcatheter aortic valve repair. Both lungs are clear. The visualized skeletal structures are unremarkable.  IMPRESSION: No active disease. Electronically Signed   By: Lynwood Landy Raddle M.D.   On: 04/30/2024 11:29      Krystal Spinner 05/01/2024  Patient ID: Carlin CHRISTELLA Dale, male   DOB: 02/21/1946, 78 y.o.   MRN: [id number]

## 2024-05-01 NOTE — Progress Notes (Signed)
 NAME:  Gary Carroll, MRN:  [id number], DOB:  05-30-1945, LOS: 1 ADMISSION DATE:  04/30/2024, CONSULTATION DATE: 12/12 REFERRING MD: Dr. Suzette Sham Penn, EDP, CHIEF COMPLAINT: Shock  History of Present Illness:  78 year old male with past medical history significant for severe aortic stenosis status post TAVR, paroxysmal atrial fibrillation on Eliquis , CAD, rectal adenocarcinoma, diabetes, and HFpEF.  He has recent admissions for multiple comorbidities including sepsis secondary to E. coli bacteremia, strep bacteremia from urinary source, and heart failure.  Transesophageal echocardiogram at the time of the bacteremia showed no vegetations.  He resides in a skilled nursing facility from where he presented to [facility] emergency department 12/12 unresponsive with fever and hypoxia.  Immediately upon arrival to the emergency department he was found to be hypotensive with systolic blood pressures in the 70s and he was started on vasopressors.  With his history concern was of course for infection.  Laboratory evaluation was significant for WBC 27.4, hemoglobin 10.9, platelets 160, lactic acid 4.2 up trended to 4.7.  Urinalysis with moderate hemoglobin, moderate leukocytes, proteinuria and many bacteria.  Blood and urine cultures were sent, empiric antibiotics were initiated.  Due to ICU needs for vasopressors PCCM was at [facility] was called and has accepted for transfer/admission.  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   Significant Hospital Events: Including procedures, antibiotic start and stop dates in addition to other pertinent events   12/12 > transferred from [facility]   Interim History / Subjective:  Improved mentation today.  Patient has no complaints other than some soreness in his back  Objective    Blood pressure 98/60, pulse (!) 109, temperature (!) 97.3 F (36.3 C), temperature source Oral, resp. rate (!) 26, height 5' 11 (1.803 m), weight 91.2 kg, SpO2 93%.        Intake/Output Summary (Last 24 hours) at 05/01/2024 0915 Last data filed at 05/01/2024 0500 Gross per 24 hour  Intake 3182.89 ml  Output 1850 ml  Net 1332.89 ml   Filed Weights   04/30/24 1016 05/01/24 0500  Weight: 90.7 kg 91.2 kg    Examination: General:  ill appearing, no distress  HENT:  [place]/AT Lungs: CTAB Cardiovascular: RRR, Nl S1,S2 Abdomen:  Soft, NT ,ND Extremities: Chronic venous stasis  Neuro:  Alert, oriented, motor and sensation inctact  GU: Anal fissure extending below scrotum oozing fecal matter and blood, see media tab   Resolved problem list   Assessment and Plan   Neuro #Toxic metabolic encephalopathy in the setting of sepsis (improved)  Respiratory #Acute hypoxic respiratory failure  (improved) No issues otherwise   Cardiac 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  As well as E. coli bacteremia resistant to ampicillin, Unasyn, cefazolin , and Bactrim.  LA 43.4 <4.7<4.2  - NE infusion to keep MAP > 65.  Slowly weaning down  - Empiric antibiotics with cefepime , Vanc and flagyl . Pending cultures tiday  - Start Solu cortef  100 BID for stress dose steroids. Continue current dose  - Blood  and urine cultures pending  (NGTD)  Hx of severe aortic stenosis s/p TAVR,  paroxysmal A-fib on  Eliquis  CAD - PTA Eliquis  5 mg BID, last dose this AM  - Hold in the anticipation of any potential surgery till cleared by colorectal surgery   Hx of heart failure with preserved ejection fraction  Last Echo 12/ 2024 EF 60-65%, normal LV function moderate concentric LVH normal right ventricular systolic function, normal mitral and tricuspid valve repair replaced aortic valve  - Monitor I and Os and daily weight - Keep Mag > 2 and K > 4  - pending repeat Echo   GI: Open draining rectal fistula  Hx of Rectal adenocarcinoma and anal fissures  Follows Dr Teresa at [facility], per the last OV note on 06/2023, patient expressed that he did not wanted to pursue surgery which would require permanent colostomy. Has been refusing anorectal exams per chart review.  - F/u CT abdomen and pelvis contracts: NO acute abnormality  - General surgery consulted  : will discuss further on Monday  - start regular diet    ID Recent history of group C strep bacteremia resistant to clindamycin and erythromycin.  As well as E. coli bacteremia resistant to ampicillin, Unasyn, cefazolin , and Bactrim.  - Continue Vanc, cefepime  and flagyl   - F/u blood cultures and urine cultures and de-escalate    Endo  T2DM - SSI moderate   Renal Acute kidney injury likely pre-renal  (improved) Presented with Scr 1.38, BUN 23; baseline ~0.48  - Monitor Daily I/Os, Daily weight  - Maintain MAP>65 for optimal renal perfusion.  - Avoid nephrotoxic agents such as IV contrast, NSAIDs, and phosphate containing bowel preps (FLEETS)  Heme Normocytic anemia Hgb 10.9, active bleeding was noted near the rectal fistula which has been stopped - Trend   Anticoagulation:  SQ heparin    Skin: Sacral decub - WOC consult     Labs   CBC: Recent Labs  Lab 04/30/24 1033 04/30/24 1916 05/01/24 0257  WBC 27.4* 24.8* 23.2*  NEUTROABS 26.0*  --   --   HGB 10.9* 9.7* 10.1*  HCT 36.4* 31.9*  33.9*  MCV 86.3 85.8 85.8  PLT 160 130* 148*    Basic Metabolic Panel: Recent Labs  Lab 04/30/24 1033 04/30/24 1916 05/01/24 0257  NA 140 138 139  K 5.0 3.8 3.9  CL 105 110 110  CO2 20* 21* 20*  GLUCOSE 90 139* 158*  BUN 23 23 18   CREATININE 1.38* 0.89 0.71  CALCIUM  9.1 7.6* 7.9*  MG  --  1.5* 2.9*  PHOS  --  3.6 4.7*   GFR: Estimated Creatinine Clearance: 86.5 mL/min (by C-G formula based on SCr of 0.71 mg/dL). Recent Labs  Lab 04/30/24 1033 04/30/24 1135 04/30/24 1327 04/30/24 1916 05/01/24 0257  WBC 27.4*  --   --  24.8* 23.2*  LATICACIDVEN 4.2* 4.7* 3.4*  --   --     Liver Function Tests: Recent Labs  Lab 04/30/24 1033 04/30/24 1916  AST 43* 32  ALT 21 19  ALKPHOS 119 76  BILITOT 0.4 0.4  PROT 6.6 5.4*  ALBUMIN 3.2* 1.9*   No results for input(s): LIPASE, AMYLASE in the last 168 hours. No results for input(s): AMMONIA in the last 168 hours.  ABG    Component Value Date/Time   PHART 7.353 02/05/2023 1649   PCO2ART 38.7 02/05/2023 1649   PO2ART 66 (L) 02/05/2023 1649   HCO3 23.9 02/05/2023 1654  TCO2 25 02/25/2023 1113   ACIDBASEDEF 2.0 02/05/2023 1654   O2SAT 50 02/05/2023 1654     Coagulation Profile: Recent Labs  Lab 04/30/24 1033 04/30/24 1916  INR 1.7* 1.9*    Cardiac Enzymes: No results for input(s): CKTOTAL, CKMB, CKMBINDEX, TROPONINI in the last 168 hours.  HbA1C: Hgb A1c MFr Bld  Date/Time Value Ref Range Status  01/02/2024 04:55 AM 4.6 (L) 4.8 - 5.6 % Final    Comment:    (NOTE) Diagnosis of Diabetes The following HbA1c ranges recommended by the American Diabetes Association (ADA) may be used as an aid in the diagnosis of diabetes mellitus.  Hemoglobin             Suggested A1C NGSP%              Diagnosis  <5.7                   Non Diabetic  5.7-6.4                Pre-Diabetic  >6.4                   Diabetic  <7.0                   Glycemic control for                       adults with  diabetes.    08/21/2023 07:10 AM 6.1 (H) 4.8 - 5.6 % Final    Comment:    (NOTE) Pre diabetes:          5.7%-6.4%  Diabetes:              >6.4%  Glycemic control for   <7.0% adults with diabetes     CBG: Recent Labs  Lab 04/30/24 1657 04/30/24 1957 04/30/24 2335 05/01/24 0330 05/01/24 0726  GLUCAP 91 135* 146* 146* 140*    Review of Systems:   As above   Past Medical History:  He,  has a past medical history of A-fib (HCC), Allergic rhinitis, Arthritis, Asthma, Bilateral knee pain, CAD (coronary artery disease), Diabetes mellitus without complication (HCC), Diverticulosis (02/2012), Erectile dysfunction, GERD (gastroesophageal reflux disease), Hiatal hernia, HLD (hyperlipidemia), HTN (hypertension), Hydronephrosis determined by ultrasound (08/22/2015), Overactive bladder, Prediabetes, Pulmonary lesion, Ringing of ears, S/P TAVR (transcatheter aortic valve replacement) (02/25/2023), Severe aortic stenosis, Sleep apnea, Urinary frequency, Urinary urgency, and Wears glasses.   Surgical History:   Past Surgical History:  Procedure Laterality Date   ABDOMINAL AORTOGRAM W/LOWER EXTREMITY N/A 02/05/2023   Procedure: ABDOMINAL AORTOGRAM W/LOWER EXTREMITY;  Surgeon: Wendel Lurena POUR, MD;  Location: [facility] INVASIVE CV LAB;  Service: Cardiovascular;  Laterality: N/A;   CATARACT EXTRACTION W/PHACO Left 12/07/2021   Procedure: CATARACT EXTRACTION PHACO AND INTRAOCULAR LENS PLACEMENT (IOC);  Surgeon: Harrie Agent, MD;  Location: [facility] ORS;  Service: Ophthalmology;  Laterality: Left;  CDE 7.75   CATARACT EXTRACTION W/PHACO Right 12/21/2021   Procedure: CATARACT EXTRACTION PHACO AND INTRAOCULAR LENS PLACEMENT (IOC);  Surgeon: Harrie Agent, MD;  Location: [facility] ORS;  Service: Ophthalmology;  Laterality: Right;  CDE: 8.00   CHOLECYSTECTOMY     COLONOSCOPY  02/2012   Dr. Debrah: sigmoid, transverse, and ascending colon diverticulosis   COLONOSCOPY WITH PROPOFOL  N/A 01/13/2023   Procedure: COLONOSCOPY  WITH PROPOFOL ;  Surgeon: Cindie Carlin POUR, DO;  Location: [facility] END[facility] SUITE;  Service: Endoscopy;  Laterality: N/A;  1:00 pm, asa 3   FINGER  SURGERY Right    INTRAOPERATIVE TRANSTHORACIC ECHOCARDIOGRAM N/A 02/25/2023   Procedure: INTRAOPERATIVE TRANSTHORACIC ECHOCARDIOGRAM;  Surgeon: Wonda Sharper, MD;  [facility] INVASIVE CV LAB;  Service: Open Heart Surgery;  Laterality: N/A;   prostate procedure  2017   per patient, was having trouble urinating    RIGHT HEART CATH AND CORONARY ANGIOGRAPHY N/A 02/05/2023   Procedure: RIGHT HEART CATH AND CORONARY ANGIOGRAPHY;  Surgeon: Wendel Lurena POUR, MD;  Location: [facility] INVASIVE CV LAB;  Service: Cardiovascular;  Laterality: N/A;   TEE WITHOUT CARDIOVERSION N/A 01/05/2024   Procedure: ECHOCARDIOGRAM, TRANSESOPHAGEAL;  Surgeon: Stacia Diannah SQUIBB, MD;  Location: [facility];  Service: Cardiovascular;  Laterality: N/A;   TONSILLECTOMY     TOTAL KNEE ARTHROPLASTY  2012   left (Dr. Dorian)   TOTAL KNEE ARTHROPLASTY Right 06/19/2015   Procedure: TOTAL KNEE ARTHROPLASTY;  Surgeon: Marcey Raman, MD;  Location: [facility] OR;  Service: Orthopedics;  Laterality: Right;   TRANSCATHETER AORTIC VALVE REPLACEMENT, TRANSFEMORAL N/A 02/25/2023   Procedure: Transcatheter Aortic Valve Replacement, Transfemoral;  Surgeon: Wonda Sharper, MD;  Location: [facility] INVASIVE CV LAB;  Service: Open Heart Surgery;  Laterality: N/A;     Social History:   reports that he quit smoking about 14 years ago. His smoking use included cigarettes. He smoked an average of 1 pack per day. He has never used smokeless tobacco. He reports that he does not currently use alcohol after a past usage of about 1.0 - 2.0 standard drink of alcohol per week. He reports that he does not use drugs.   Family History:  His family history includes Cancer in his mother; Coronary artery disease in his father and paternal grandfather; Heart disease in his paternal grandfather; Uterine cancer in his mother. There is no history of Colon  cancer, Esophageal cancer, Stomach cancer, Rectal cancer, or Liver disease.   Allergies Allergies[1]   Home Medications  Prior to Admission medications  Medication Sig Start Date End Date Taking? Authorizing Provider  acetaminophen  (TYLENOL ) 325 MG tablet Take 2 tablets (650 mg total) by mouth every 6 (six) hours as needed for mild pain (pain score 1-3), moderate pain (pain score 4-6), fever or headache (or Fever >/= 101). 01/06/24   Johnson, [name], MD  apixaban  (ELIQUIS ) 5 MG TABS tablet Take 1 tablet (5 mg total) by mouth 2 (two) times daily. 08/24/15   Theophilus Andrews, Tully GRADE, MD  clotrimazole -betamethasone  (LOTRISONE ) cream Apply 1 Application topically in the morning, at noon, and at bedtime. To groin/scrotum/perineum for dermatitis    [provider]  diltiazem  (CARDIZEM  CD) 240 MG 24 hr capsule Take 1 capsule (240 mg total) by mouth daily. 01/06/24   Johnson, [name], MD  dutasteride  (AVODART ) 0.5 MG capsule Take 0.5 mg by mouth daily.    [provider]  ipratropium (ATROVENT ) 0.02 % nebulizer solution Take 2.5 mLs (0.5 mg total) by nebulization every 6 (six) hours as needed for wheezing. 01/06/24   Johnson, [name], MD  leptospermum manuka honey (MEDIHONEY) PSTE paste Apply 1 Application topically daily. Cleanse coccyx/buttocks with Vashe, apply Medihoney to wound beds daily and cover with dry gauze and silicone foam or ABD pad whichever is preferred. Interchangeable with TheraHoney Apply thin layer (3 mm) to wound. 01/06/24   Johnson, [name], MD  levalbuterol  (XOPENEX ) 0.63 MG/3ML nebulizer solution Take 3 mLs (0.63 mg total) by nebulization every 6 (six) hours as needed for shortness of breath or wheezing. 01/06/24   [name] Afton CROME, MD  liver oil-zinc  oxide (  DESITIN) 40 % ointment Apply topically 2 (two) times daily. Apply a thin layer of Desitin to intact skin of buttocks 2 times daily and prn soiling 01/06/24   Johnson, [name], MD  Magnesium  500 MG  CAPS Take 500 mg by mouth daily.    [provider]  melatonin 5 MG TABS Take 10 mg by mouth at bedtime.    [provider]  metFORMIN  (GLUCOPHAGE -XR) 500 MG 24 hr tablet Take 1 tablet (500 mg total) by mouth daily with breakfast. 01/06/24   [name] Afton CROME, MD  metoprolol  tartrate (LOPRESSOR ) 25 MG tablet Take 1 tablet (25 mg total) by mouth 2 (two) times daily. 01/06/24   [name] Afton CROME, MD  Multiple Vitamin (MULTIVITAMIN WITH MINERALS) TABS tablet Take 1 tablet by mouth daily. 01/06/24   Johnson, [name], MD  Nutritional Supplement LIQD Take 120 mLs by mouth with breakfast, with lunch, and with evening meal. House Supplement    [provider]  oxybutynin  (DITROPAN ) 5 MG tablet Take 5 mg by mouth at bedtime. 06/01/19   [provider]  rosuvastatin  (CRESTOR ) 20 MG tablet Take 20 mg by mouth daily.    [provider]  senna-docusate (SENOKOT-S) 8.6-50 MG tablet Take 1 tablet by mouth at bedtime as needed for mild constipation. 01/06/24   Johnson, [name], MD  vitamin C  (ASCORBIC ACID ) 500 MG tablet Take 500 mg by mouth daily.    [provider]  zinc  sulfate, 50mg  elemental zinc , 220 (50 Zn) MG capsule Take 1 capsule (220 mg total) by mouth daily. 01/06/24   [name] Afton CROME, MD     The patient is critically ill due to septic shock on vasopressor support.  Critical care was necessary to treat or prevent imminent or life-threatening deterioration. Critical care time was spent by me on the following activities: development of a treatment plan with the patient and/or surrogate as well as nursing, discussions with consultants, evaluation of the patient's response to treatment, examination of the patient, obtaining a history from the patient or surrogate, ordering and performing treatments and interventions, ordering and review of laboratory studies, ordering and review of radiographic studies, review of telemetry data including pulse  oximetry, re-evaluation of patient's condition and participation in multidisciplinary rounds.   I personally spent 34 minutes providing critical care not including any separately billable procedures.   Zola LOISE Herter, MD [facility] 05/01/2024 9:27 AM          [1]  Allergies Allergen Reactions   Diovan [Valsartan] Cough   Tagamet [Cimetidine] Other (See Comments)    Irritability

## 2024-05-01 NOTE — Consult Note (Signed)
 WOC Nurse Consult Note: patient is known to WOC team from previous admission with coccyx pressure injury and rectal fissure/wound from prior chemo/radiation to rectal CA  Reason for Consult: sacral and rectal wound  Wound type: 1.  Deep Tissue Pressure Injury sacrum extending down onto medial buttocks/purple maroon discoloration evolving with lifting of dermis 2.  Healing linear stage 3 to coccyx pink moist  2.  Rectal mass evaluated by surgeon (see Dr. Rubin note 12/12 which states this is a fungating rectal mass) surgery following  Pressure Injury POA: Yes sacrum/coccyx  Measurement: see nursing flowsheet  Wound bed:as above  Drainage (amount, consistency, odor) see nursing flowsheet  Periwound: erythema  Dressing procedure/placement/frequency: Cleanse sacral/linear coccyx and buttocks wounds with Vashe, do not rinse. Apply silver hydrofiber ([place] [phone]) to wound beds daily and secure with silicone foam or ABD pad and clothe tape whichever is preferred.   Surgery is following rectal mass at this time.  CT scan revealed cutaneous fistula, surgery to follow Monday 12/15.    Deep Tissue Pressure Injuries are high risk to deteriorate.  WOC team will not follow at this time. Please reconsult if necrotic tissue or acute changes to sacral wound occur.   Thank you,    Powell Bar MSN, RN-BC, [employer]

## 2024-05-01 NOTE — Progress Notes (Signed)
Heart rate too high for accurate echo at this time. 

## 2024-05-02 ENCOUNTER — Inpatient Hospital Stay (INDEPENDENT_AMBULATORY_CARE_PROVIDER_SITE_OTHER)

## 2024-05-02 ENCOUNTER — Inpatient Hospital Stay (HOSPITAL_COMMUNITY)

## 2024-05-02 DIAGNOSIS — R578 Other shock: Secondary | ICD-10-CM

## 2024-05-02 LAB — BASIC METABOLIC PANEL WITH GFR
Anion gap: 8 (ref 5–15)
BUN: 23 mg/dL (ref 8–23)
CO2: 21 mmol/L — ABNORMAL LOW (ref 22–32)
Calcium: 8.3 mg/dL — ABNORMAL LOW (ref 8.9–10.3)
Chloride: 112 mmol/L — ABNORMAL HIGH (ref 98–111)
Creatinine, Ser: 0.67 mg/dL (ref 0.61–1.24)
GFR, Estimated: >60 mL/min (ref >60)
Glucose, Bld: 88 mg/dL (ref 70–99)
Potassium: 3.1 mmol/L — ABNORMAL LOW (ref 3.5–5.1)
Sodium: 141 mmol/L (ref 135–145)

## 2024-05-02 LAB — ECHOCARDIOGRAM COMPLETE
AR max vel: 4.01 cm2
AV Area VTI: 3.49 cm2
AV Area mean vel: 3.96 cm2
AV Mean grad: 5.7 mmHg
AV Peak grad: 10.8 mmHg
Ao pk vel: 1.64 m/s
Area-P 1/2: 4.39 cm2
Calc EF: 60.1 %
Est EF: >75
Height: 71 in
S' Lateral: 2.9 cm
Single Plane A2C EF: 49.5 %
Single Plane A4C EF: 65.4 %
Weight: 3333.36 [oz_av]

## 2024-05-02 LAB — GLUCOSE, CAPILLARY
Glucose-Capillary: 101 mg/dL — ABNORMAL HIGH (ref 70–99)
Glucose-Capillary: 114 mg/dL — ABNORMAL HIGH (ref 70–99)
Glucose-Capillary: 81 mg/dL (ref 70–99)
Glucose-Capillary: 81 mg/dL (ref 70–99)
Glucose-Capillary: 82 mg/dL (ref 70–99)
Glucose-Capillary: 89 mg/dL (ref 70–99)

## 2024-05-02 MED ORDER — SODIUM CHLORIDE 0.9 % IV SOLN
2.0000 g | Freq: Three times a day (TID) | INTRAVENOUS | Status: DC
  Administered 2024-05-02 – 2024-05-03 (×3): 2 g via INTRAVENOUS
  Filled 2024-05-02 (×3): qty 12.5

## 2024-05-02 MED ORDER — POTASSIUM CHLORIDE 10 MEQ/100ML IV SOLN
10.0000 meq | INTRAVENOUS | Status: DC
  Administered 2024-05-02 (×2): 10 meq via INTRAVENOUS
  Filled 2024-05-02 (×2): qty 100

## 2024-05-02 MED ORDER — ENOXAPARIN SODIUM 100 MG/ML IJ SOSY
95.0000 mg | PREFILLED_SYRINGE | Freq: Two times a day (BID) | INTRAMUSCULAR | Status: DC
  Administered 2024-05-02 – 2024-05-03 (×3): 95 mg via SUBCUTANEOUS
  Filled 2024-05-02 (×3): qty 0.95

## 2024-05-02 MED ORDER — PERFLUTREN LIPID MICROSPHERE
1.0000 mL | INTRAVENOUS | Status: AC | PRN
  Administered 2024-05-02: 2 mL via INTRAVENOUS

## 2024-05-02 MED ORDER — POTASSIUM CHLORIDE CRYS ER 20 MEQ PO TBCR
20.0000 meq | EXTENDED_RELEASE_TABLET | Freq: Once | ORAL | Status: AC
  Administered 2024-05-02: 20 meq via ORAL
  Filled 2024-05-02: qty 1

## 2024-05-02 MED ORDER — POTASSIUM CHLORIDE CRYS ER 20 MEQ PO TBCR
20.0000 meq | EXTENDED_RELEASE_TABLET | ORAL | Status: DC
  Administered 2024-05-02: 20 meq via ORAL
  Filled 2024-05-02: qty 1

## 2024-05-02 MED ORDER — VANCOMYCIN HCL 1250 MG/250ML IV SOLN
1250.0000 mg | Freq: Two times a day (BID) | INTRAVENOUS | Status: DC
  Administered 2024-05-02 – 2024-05-03 (×3): 1250 mg via INTRAVENOUS
  Filled 2024-05-02 (×3): qty 250

## 2024-05-02 MED ORDER — METOPROLOL TARTRATE 25 MG PO TABS
25.0000 mg | ORAL_TABLET | Freq: Three times a day (TID) | ORAL | Status: DC
  Administered 2024-05-02 – 2024-05-06 (×11): 25 mg via ORAL
  Filled 2024-05-02 (×11): qty 1

## 2024-05-02 MED ORDER — POTASSIUM CHLORIDE CRYS ER 20 MEQ PO TBCR
40.0000 meq | EXTENDED_RELEASE_TABLET | ORAL | Status: AC
  Administered 2024-05-02: 40 meq via ORAL
  Filled 2024-05-02: qty 2

## 2024-05-02 MED ADMIN — Metronidazole IV Soln 500 MG/100ML: 500 mg | INTRAVENOUS | NDC 00338105548

## 2024-05-02 NOTE — Progress Notes (Signed)
 [facility] ADULT ICU REPLACEMENT PROTOCOL   The patient does apply for the [facility] Adult ICU Electrolyte Replacment Protocol based on the criteria listed below:   1.Exclusion criteria: TCTS, ECMO, Dialysis, and Myasthenia Gravis patients 2. Is GFR >/= 30 ml/min? Yes.    Patient's GFR today is > 60 3. Is SCr </= 2? Yes.   Patient's SCr is 0.67 mg/dL 4. Did SCr increase >/= 0.5 in 24 hours? No. 5.Pt's weight >40kg  Yes.   6. Abnormal electrolyte(s): K+ 3.1  7. Electrolytes replaced per protocol 8.  Call MD STAT for K+ </= 2.5, Phos </= 1, or Mag </= 1 Physician:  Dr Marion Darner, Norleen Barters 05/02/2024 5:34 AM

## 2024-05-02 NOTE — Evaluation (Signed)
 Physical Therapy Evaluation Patient Details Name: Gary Carroll MRN: [id number] DOB: 07-Jun-1945 Today's Date: 05/02/2024  History of Present Illness  78 y.o. male admitted to [facility] from SNF on 04/30/24 unresponsive with fever and hypoxia. 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 PMH includes PAF on Eliquis , CAD, HFpEF, DM2, rectal adenocarcinoma.   Clinical Impression  Pt presents with an overall decrease in functional mobility secondary to above. PTA, pt from SNF, reports primarily bedbound since therapies stopped working with him (was doing w/c-level mobility). Today, pt requiring max-totalA for bed-level mobility and BLE repositioning; pt limited by generalized weakness, decreased activity tolerance. Will require maxisky lift for OOB transfers with nursing staff. Pt would benefit from post-acute rehab services (< 3 hrs/day) to maximize functional mobility; recommend return to SNF. Will follow acutely to address established goals.      If plan is discharge home, recommend the following: Two people to help with walking and/or transfers;Two people to help with bathing/dressing/bathroom   Can travel by private vehicle   No    Equipment Recommendations  (defer to next venue)  Recommendations for Other Services       Functional Status Assessment Patient has had a recent decline in their functional status and demonstrates the ability to make significant improvements in function in a reasonable and predictable amount of time.     Precautions / Restrictions Precautions Precautions: Fall Recall of Precautions/Restrictions: Impaired Restrictions Weight Bearing Restrictions Per Provider Order: No      Mobility  Bed Mobility Overal bed mobility: Needs Assistance Bed Mobility: Rolling Rolling: Total assist         General bed mobility comments: totalA for partial rolling; pt attempting to pull on bilateral bed rails from elevated HOB,  unable to translate weight anteriorly, requiring maxA for trunk elevation; maxA for BLE repositioning; will require +2 assist for supine<>sit    Transfers                   General transfer comment: deferred - will require +2 assist and lift equipment    Ambulation/Gait                  Stairs            Wheelchair Mobility     Tilt Bed    Modified Rankin (Stroke Patients Only)       Balance                                             Pertinent Vitals/Pain Pain Assessment Pain Assessment: Faces Faces Pain Scale: Hurts little more Pain Location: back, buttocks, BLEs with AAROM Pain Descriptors / Indicators: Discomfort, Grimacing, Guarding Pain Intervention(s): Monitored during session, Limited activity within patient's tolerance, Repositioned    Home Living Family/patient expects to be discharged to:: Skilled nursing facility                        Prior Function Prior Level of Function : Needs assist             Mobility Comments: reports no longer working with therapies at SNF (was getting to wheelchair with them), since that stopped has been bedbound. per chart, prior to admission 08/2023 was household ambulator with RW, lviing alone ADLs Comments: assist for all ADLs bed-level, able to feed self  Extremity/Trunk Assessment   Upper Extremity Assessment Upper Extremity Assessment: Generalized weakness (functional observed strength >/ 3/5 with good overhead AROM)    Lower Extremity Assessment Lower Extremity Assessment: RLE deficits/detail;LLE deficits/detail RLE Deficits / Details: gross BLE strength </ 2/5 with significant stiffness throughout LLE Deficits / Details: gross BLE strength </ 2/5 with significant stiffness throughout; significant LLE out-toeing with hip/tibial ER (pt does not seem aware of this)       Communication   Communication Communication: No apparent difficulties    Cognition  Arousal: Alert Behavior During Therapy: WFL for tasks assessed/performed   PT - Cognitive impairments: No family/caregiver present to determine baseline                       PT - Cognition Comments: WFL for simple tasks, not formally assessed; decreased attention and awareness Following commands: Intact Following commands impaired: Only follows one step commands consistently     Cueing Cueing Techniques: Verbal cues     General Comments General comments (skin integrity, edema, etc.): educ re: role of acute PT, POC, activity recommendations, lift equipment options for OOB transfers, importance of mobility, potential d/c needs. supine BP 91/68, post-bed-level mobility BP 116/85; HR 120s, SpO2 99% on RA    Exercises     Assessment/Plan    PT Assessment Patient needs continued PT services  PT Problem List Decreased strength;Decreased range of motion;Decreased activity tolerance;Decreased balance;Decreased mobility;Decreased cognition;Decreased knowledge of use of DME;Pain;Decreased skin integrity;Obesity       PT Treatment Interventions DME instruction;Gait training;Functional mobility training;Therapeutic activities;Therapeutic exercise;Balance training;Patient/family education;Wheelchair mobility training    PT Goals (Current goals can be found in the Care Plan section)  Acute Rehab PT Goals Patient Stated Goal: I'd like to be able to get on my feet again PT Goal Formulation: With patient Time For Goal Achievement: 05/16/24 Potential to Achieve Goals: Fair    Frequency Min 1X/week     Co-evaluation               AM-PAC PT 6 Clicks Mobility  Outcome Measure Help needed turning from your back to your side while in a flat bed without using bedrails?: Total Help needed moving from lying on your back to sitting on the side of a flat bed without using bedrails?: Total Help needed moving to and from a bed to a chair (including a wheelchair)?: Total Help needed  standing up from a chair using your arms (e.g., wheelchair or bedside chair)?: Total Help needed to walk in hospital room?: Total Help needed climbing 3-5 steps with a railing? : Total 6 Click Score: 6    End of Session   Activity Tolerance: Patient limited by fatigue;Patient limited by pain Patient left: in bed;with call bell/phone within reach;with bed alarm set Nurse Communication: Mobility status;Need for lift equipment PT Visit Diagnosis: Other abnormalities of gait and mobility (R26.89);Muscle weakness (generalized) (M62.81)    Time: 8855-8797 PT Time Calculation (min) (ACUTE ONLY): 18 min   Charges:   PT Evaluation $PT Eval Moderate Complexity: 1 Mod   PT General Charges $$ ACUTE PT VISIT: 1 Visit    Darice Almas, PT, DPT Acute Rehabilitation Services  Personal: Secure Chat Rehab Office: [phone]  Darice LITTIE Almas 05/02/2024, 2:56 PM

## 2024-05-02 NOTE — Progress Notes (Signed)
°  Echocardiogram 2D Echocardiogram has been performed.  Devora Ellouise SAUNDERS 05/02/2024, 10:12 AM

## 2024-05-02 NOTE — Progress Notes (Signed)
 Pharmacy Antibiotic Note  Gary Carroll is a 78 y.o. male admitted on 04/30/2024 with UTI, sepsis.  Pharmacy has been consulted for vancomycin  dosing.  Staph aureus in urine culture - blood cultures still pending   Plan: Vancomycin  1250mg  IV q12h (eAUC 520) Goal trough 15-50mcg/mL, Goal AUC 400-600 -F/u renal function, LOT, and culture data -F/u vancomycin  levels PRN per protocol Adjust Cefepime  2g IV to q8h    Height: 5' 11 (180.3 cm) Weight: 94.5 kg (208 lb 5.4 oz) IBW/kg (Calculated) : 75.3  Temp (24hrs), Avg:98.2 F (36.8 C), Min:97.7 F (36.5 C), Max:98.9 F (37.2 C)  Recent Labs  Lab 04/30/24 1033 04/30/24 1135 04/30/24 1327 04/30/24 1916 05/01/24 0257 05/02/24 0213  WBC 27.4*  --   --  24.8* 23.2*  --   CREATININE 1.38*  --   --  0.89 0.71 0.67  LATICACIDVEN 4.2* 4.7* 3.4*  --   --   --     Estimated Creatinine Clearance: 87.9 mL/min (by C-G formula based on SCr of 0.67 mg/dL).    Allergies[1]  Antimicrobials this admission: Ceftriaxone  12/12 x1 Flagyl  12/12>12/13 Cefepime  12/12> Vanc 12/12 x1; 12/14>  Dose adjustments this admission:  Microbiology results: 12/12 BCX: NGx2 12/12 UCX: staph aureus, suscept pending  12/12 MRSA pos  12/12 RVP neg  Thank you for allowing pharmacy to be a part of this patients care.  Sharyne Glatter, PharmD, BCCCP Critical Care Clinical Pharmacist 05/02/2024 11:33 AM    [1]  Allergies Allergen Reactions   Diovan [Valsartan] Cough   Tagamet [Cimetidine] Other (See Comments)    Irritability

## 2024-05-02 NOTE — Plan of Care (Signed)
°  Problem: Education: Goal: Knowledge of General Education information will improve Description: Including pain rating scale, medication(s)/side effects and non-pharmacologic comfort measures Outcome: Progressing   Problem: Clinical Measurements: Goal: Ability to maintain clinical measurements within normal limits will improve Outcome: Progressing   Problem: Clinical Measurements: Goal: Will remain free from infection Outcome: Progressing   Problem: Clinical Measurements: Goal: Diagnostic test results will improve Outcome: Progressing   Problem: Clinical Measurements: Goal: Respiratory complications will improve Outcome: Progressing   Problem: Clinical Measurements: Goal: Cardiovascular complication will be avoided Outcome: Progressing   Problem: Skin Integrity: Goal: Risk for impaired skin integrity will decrease Outcome: Progressing   Problem: Safety: Goal: Ability to remain free from injury will improve Outcome: Progressing   Problem: Pain Managment: Goal: General experience of comfort will improve and/or be controlled Outcome: Progressing   Plan of care, monitoring, assessment, treatment, and intervention (s) ongoing, see MAR see flowsheet

## 2024-05-02 NOTE — Consult Note (Signed)
 Palliative Medicine Inpatient Consult Note  Consulting Provider:  Reason for consult:   Palliative Care Consult Services Palliative Medicine Consult  Reason for Consult? hx of rectal adneoCa, refused surgery in the past, coming in with sepsis, will need help with GOC   05/02/2024  HPI:  Per intake H&P --> 78 year old male with past medical history significant for severe aortic stenosis status post TAVR, paroxysmal atrial fibrillation on Eliquis , CAD, rectal adenocarcinoma, diabetes, and HFpEF. Bora has been admitted in the setting of acute respiratory failure and septic shock 2/2 rectal fistula. Palliative care has been asked to support goals of care conversations.   Clinical Assessment/Goals of Care:  I have reviewed medical records including EPIC notes, labs and imaging, received report from bedside RN, assessed the patient who is sitting in the ICU bed awake and alert to person place time and situation.    I met with Carlin to further discuss diagnosis prognosis, GOC, EOL wishes, disposition and options.   I introduced Palliative Medicine as specialized medical care for people living with serious illness. It focuses on providing relief from the symptoms and stress of a serious illness. The goal is to improve quality of life for both the patient and the family.  Medical History Review and Understanding:  A review of Jiovanny's past medical history significant for aortic stenosis, atrial fibrillation on Eliquis , coronary artery disease, type 2 diabetes mellitus, heart failure, rectal adenocarcinoma, and sleep apnea was completed.  Social History:  Franky is from [place], [place] .  He shares that he has been married and divorced 3 times.  He does not have any children.  He was in [employer] and gets benefits through the [employer].  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  Rudra shares his greatest joy is in his younger years for hunting and fishing  and more recent year swimming at [employer].  He notes he has not been able to swim since last December.  He shares that he is a man of faith to some degree and was brought up as a Christian.  Functional and Nutritional State:  Preceding hospitalization Sadie has unfortunately circulated in and out of the hospital.  He states that he previously lived by himself in an apartment and was able to attend to all his B ADLs and IADLs.  He is no longer driving as his license time to renew elapsed.  Romney shares he does have a good appetite.  Advance Directives:  A detailed discussion was had today regarding advanced directives.  Daneil thinks he has advanced directives at the [employer] though is not certain.  He does feel if unable to make decisions for himself he would rely upon his friend Avelina Epstein to be his surrogate decision maker.  Code Status:  Concepts specific to code status, artifical feeding and hydration, continued IV antibiotics and rehospitalization was had.  The difference between a aggressive medical intervention path  and a palliative comfort care path for this patient at this time was had.   Encouraged patient/family to consider DNR/DNI status understanding evidenced based poor outcomes in similar hospitalized patient, as the cause of arrest is likely associated with advanced chronic/terminal illness rather than an easily reversible acute cardio-pulmonary event. I explained that DNR/DNI does not change the medical plan and it only comes into effect after a person has arrested (died).  It is a protective measure to keep us  from harming the patient in their last moments of life.  Jewelz was agreeable to DNR/DNI with understanding that patient would not receive CPR, defibrillation, ACLS medications, or intubation.   Jericho and I talked extensively about the realities of a cardiopulmonary resuscitation in someone like himself with multiple chronic comorbid conditions.  We  reviewed the potential outcomes and that he would likely suffer greater trauma and during those and have worse quality of life thereafter than he has presently.  He shares understanding and expresses that he had thought quite a bit about this and confirms his decision to be DO NOT RESUSCITATE DO NOT INTUBATE cardiopulmonary resuscitation status.  Discussion:  Maxxon and I discussed his reason for admission to the hospital inclusive of his altered mental state which appears at this time to be improving.  We discussed his sepsis from the fistula in his rectal area.  We reviewed that he has started to improve to the point where he is off the medications to improve his blood pressure.  We reviewed that at this point in time it does seem he is encroaching clinical stability from the perspective of the need for intensive care unit care.  I reviewed with Vincent what has been occurring with his health over the past 12 months.  He shares that he is overall had a decline and has endured multiple hospitalizations.  He shares understanding of his chronic comorbid conditions as we discussed them together and the incurable nature certainly of his heart failure.  We reviewed the chronic disease trajectory in patients who have multiple co-morbidities. I shared that often an event will occur leading to an  acute hospitalization such as a fall, UTI, PNA, heart failure exacerbation, copd exacerbation, or another illness sort. We discussed that patients may have been functioning at a high plateau initially, then an acute event occurs. We discussed that after this event their function, mental, and nutritional states are compromised. Often with treatment and rehabilitation there is some regain in each individuals health, though often not to their prior baseline level. We discussed that then another event will occur causing a rehospitalization and a further decline. I shared that often this will become a pattern and each event  causes greater burden to the individual, depleting them further or their function, cognition, or nutritional state.   We discussed Alessandro's goals moving forward and he feels at this time that he would like to be able to gain strength and ideally get back to doing things on his own.  We reviewed the what-ifs in terms of if this can occur and what the next steps would be if it cannot occur.  At this point in time Davaughn would like to see what he is able to do to determine his future path.  Discussed the importance of continued conversation with family and their  medical providers regarding overall plan of care and treatment options, ensuring decisions are within the context of the patients values and GOCs. _________________________ Addendum:  I called patient's friend Bruna Apple to provide an update and left a HIPAA compliant voicemail.  Decision Maker: Avelina Copa (friend): [phone]  SUMMARY OF RECOMMENDATIONS   DNAR/DNI  Continue current care allowing time for outcomes  Open and honest conversations held in the setting of patient's acute on chronic disease burden  Will request medical records from the [employer] to identify if patient has an advance directive completed and on file  Ongoing palliative care support  Code Status/Advance Care Planning: DNAR/DNI   Palliative Prophylaxis:  Aspiration, Bowel Regimen, Delirium Protocol, Frequent Pain Assessment, Oral  Care, Palliative Wound Care, and Turn Reposition  Additional Recommendations (Limitations, Scope, Preferences): Continue current care  Psycho-social/Spiritual:  Desire for further Chaplaincy support: Yes Additional Recommendations: Education on DNAR/DNI, chronic disease burden   Prognosis: Patient with multiple hospitalizations and chronic comorbid conditions placing him at higher risk of 56-month mortality.  Discharge Planning: Discharge plan to be determined.  Vitals:   05/02/24 0600 05/02/24 0700  BP: 116/76  128/74  Pulse: (!) 103 (!) 119  Resp: 20 19  Temp:    SpO2: (!) 80% 98%    Intake/Output Summary (Last 24 hours) at 05/02/2024 0803 Last data filed at 05/02/2024 0700 Gross per 24 hour  Intake 1845.41 ml  Output 1025 ml  Net 820.41 ml   Last Weight  Most recent update: 05/02/2024  6:09 AM    Weight  94.5 kg (208 lb 5.4 oz)             LABS: CBC:    Component Value Date/Time   WBC 23.2 (H) 05/01/2024 0257   HGB 10.1 (L) 05/01/2024 0257   HCT 33.9 (L) 05/01/2024 0257   PLT 148 (L) 05/01/2024 0257   MCV 85.8 05/01/2024 0257   NEUTROABS 26.0 (H) 04/30/2024 1033   LYMPHSABS 0.3 (L) 04/30/2024 1033   MONOABS 0.5 04/30/2024 1033   EOSABS 0.0 04/30/2024 1033   BASOSABS 0.0 04/30/2024 1033   Comprehensive Metabolic Panel:    Component Value Date/Time   NA 141 05/02/2024 0213   K 3.1 (L) 05/02/2024 0213   CL 112 (H) 05/02/2024 0213   CO2 21 (L) 05/02/2024 0213   BUN 23 05/02/2024 0213   CREATININE 0.67 05/02/2024 0213   CREATININE 0.85 02/06/2012 0929   GLUCOSE 88 05/02/2024 0213   CALCIUM  8.3 (L) 05/02/2024 0213   AST 32 04/30/2024 1916   ALT 19 04/30/2024 1916   ALKPHOS 76 04/30/2024 1916   BILITOT 0.4 04/30/2024 1916   PROT 5.4 (L) 04/30/2024 1916   ALBUMIN 1.9 (L) 04/30/2024 1916    Gen: Elderly Caucasian male chronically ill appearing HEENT: moist mucous membranes CV: Irregular rate and rhythm  PULM: On room air breathing is even and nonlabored ABD: soft/nontender  EXT: Multiple areas of ecchymosis on bilateral upper extremities Neuro: Alert and oriented x3  PPS: 30%   This conversation/these recommendations were discussed with patient primary care team, Dr. Zaida ______________________________________________________ Rosaline Becton [facility] Palliative Medicine Team Team Cell Phone: [phone] Please utilize secure chat with additional questions, if there is no response within 30 minutes please call the above phone number  Total Time:  75 Billing based on MDM: High  Palliative Medicine Team providers are available by phone from 7am to 7pm daily and can be reached through the team cell phone.  Should this patient require assistance outside of these hours, please call the patient's attending physician.

## 2024-05-02 NOTE — Progress Notes (Signed)
 2D echo attempted, staff consult with patient. Will try later

## 2024-05-02 NOTE — Progress Notes (Addendum)
 NAME:  Gary Carroll, MRN:  [id number], DOB:  Apr 01, 1946, LOS: 2 ADMISSION DATE:  04/30/2024, CONSULTATION DATE: 12/12 REFERRING MD: Dr. Suzette Sham Penn, EDP, CHIEF COMPLAINT: Shock  History of Present Illness:  78 year old male with past medical history significant for severe aortic stenosis status post TAVR, paroxysmal atrial fibrillation on Eliquis , CAD, rectal adenocarcinoma, diabetes, and HFpEF.  He has recent admissions for multiple comorbidities including sepsis secondary to E. coli bacteremia, strep bacteremia from urinary source, and heart failure.  Transesophageal echocardiogram at the time of the bacteremia showed no vegetations.  He resides in a skilled nursing facility from where he presented to [facility] emergency department 12/12 unresponsive with fever and hypoxia.  Immediately upon arrival to the emergency department he was found to be hypotensive with systolic blood pressures in the 70s and he was started on vasopressors.  With his history concern was of course for infection.  Laboratory evaluation was significant for WBC 27.4, hemoglobin 10.9, platelets 160, lactic acid 4.2 up trended to 4.7.  Urinalysis with moderate hemoglobin, moderate leukocytes, proteinuria and many bacteria.  Blood and urine cultures were sent, empiric antibiotics were initiated.  Due to ICU needs for vasopressors PCCM was at [facility] was called and has accepted for transfer/admission.  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   Significant Hospital Events: Including procedures, antibiotic start and stop dates in addition to other pertinent events   12/12 > transferred from [facility]   Interim History / Subjective:  Improved mentation today.  Patient has no complaints other than some soreness in his back  Objective    Blood pressure 128/74, pulse (!) 119, temperature 98.9 F (37.2 C), temperature source Oral, resp. rate 19, height 5' 11 (1.803 m), weight 94.5 kg, SpO2 98%.        Intake/Output Summary (Last 24 hours) at 05/02/2024 9191 Last data filed at 05/02/2024 0700 Gross per 24 hour  Intake 1845.41 ml  Output 1025 ml  Net 820.41 ml   Filed Weights   04/30/24 1016 05/01/24 0500 05/02/24 0500  Weight: 90.7 kg 91.2 kg 94.5 kg    Examination: General: Ill-appearing, no distress HENT: Normocephalic/atraumatic Lungs: Clear to auscultation bilaterally Cardiovascular: Cardiac, irregular Abdomen: Soft, nontender Extremities: Chronic venous stasis with dermatitis Neuro: Alert, oriented x 2 GU: See media tab for anal fissure  Resolved problem list   Assessment and Plan   Neuro #Toxic metabolic encephalopathy in the setting of sepsis (improved) - Continues to have waxing and waning delirium but has capacity to make decisions  Respiratory #Acute hypoxic respiratory failure  (improved) 2 L nasal cannula for comfort  Cardiac 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  As well as E. coli bacteremia resistant to ampicillin, Unasyn, cefazolin , and Bactrim.  LA 43.4 <4.7<4.2  - Weaned off pressors since yesterday - Would continue empiric antibiotic coverage for total of 5 days with Vanco cefepime  - Stop Flagyl  - Stop Solu-Cortef  - Blood cultures pending - Urine cultures growing Staph aureus  with  sensitivities pending  Hx of severe aortic stenosis s/p TAVR,  paroxysmal A-fib on Eliquis  CAD - PTA Eliquis  5 mg BID, last dose this AM  - Hold in the anticipation of any potential surgery till cleared by colorectal surgery  - Will start therapeutic Lovenox  for his A-fib - Added metoprolol  25 twice daily for A-fib and increased to three times.  Can increase dose if heart rate still uncontrolled currently in rapid ventricular rate but hemodynamically stable  Hx of heart failure with preserved ejection fraction  Last Echo 12/ 2024 EF 60-65%, normal LV function moderate concentric LVH normal right ventricular systolic function, normal mitral and tricuspid valve repair replaced aortic valve  - Monitor I and Os and daily weight - Keep Mag > 2 and K > 4  - pending repeat Echo   GI: Open draining rectal fistula  Hx of Rectal adenocarcinoma and anal fissures  Follows Dr Teresa at [facility], per the last OV note on 06/2023, patient expressed that he did not wanted to pursue surgery which would require permanent colostomy. Has been refusing anorectal exams per chart review.  - F/u CT abdomen and pelvis contracts: NO acute abnormality  - General surgery consulted  : will discuss further on Monday  - start regular diet.  Tolerating regular diet   ID Recent history of group C strep bacteremia resistant to clindamycin and erythromycin.  As well as E. coli bacteremia resistant to ampicillin, Unasyn, cefazolin , and Bactrim.  Urine growing Staph aureus with sensitivities pending - Continue Vanc, cefepime .  Flagyl  stopped - F/u blood cultures  Endo  T2DM - SSI moderate  - CBG within goal  Renal Acute kidney injury likely pre-renal  (improved) Presented with Scr 1.38, BUN 23; baseline ~0.48  - Monitor Daily I/Os, Daily weight  - Maintain MAP>65 for optimal renal perfusion.  - Avoid nephrotoxic agents such as IV contrast, NSAIDs, and phosphate  containing bowel preps (FLEETS)  Heme Normocytic anemia  - Trend   Anticoagulation:  SQ therapeutic Lovenox   Skin: Sacral decub - WOC consult     Labs   CBC: Recent Labs  Lab 04/30/24 1033 04/30/24 1916 05/01/24 0257  WBC 27.4* 24.8* 23.2*  NEUTROABS 26.0*  --   --   HGB 10.9* 9.7* 10.1*  HCT 36.4* 31.9* 33.9*  MCV 86.3 85.8 85.8  PLT 160 130* 148*    Basic Metabolic Panel: Recent Labs  Lab 04/30/24 1033 04/30/24 1916 05/01/24 0257 05/02/24 0213  NA 140 138 139 141  K 5.0 3.8 3.9 3.1*  CL 105 110 110 112*  CO2 20* 21* 20* 21*  GLUCOSE 90 139* 158* 88  BUN 23 23 18 23   CREATININE 1.38* 0.89 0.71 0.67  CALCIUM  9.1 7.6* 7.9* 8.3*  MG  --  1.5* 2.9*  --   PHOS  --  3.6 4.7*  --    GFR: Estimated Creatinine Clearance: 87.9 mL/min (by C-G formula based on SCr of 0.67 mg/dL). Recent Labs  Lab 04/30/24 1033 04/30/24 1135 04/30/24 1327 04/30/24 1916 05/01/24 0257  WBC 27.4*  --   --  24.8* 23.2*  LATICACIDVEN 4.2* 4.7* 3.4*  --   --     Liver Function Tests: Recent Labs  Lab 04/30/24 1033 04/30/24 1916  AST 43* 32  ALT 21 19  ALKPHOS 119 76  BILITOT 0.4 0.4  PROT 6.6 5.4*  ALBUMIN 3.2* 1.9*   No results for input(s): LIPASE, AMYLASE in the last 168 hours. No  results for input(s): AMMONIA in the last 168 hours.  ABG    Component Value Date/Time   PHART 7.353 02/05/2023 1649   PCO2ART 38.7 02/05/2023 1649   PO2ART 66 (L) 02/05/2023 1649   HCO3 23.9 02/05/2023 1654   TCO2 25 02/25/2023 1113   ACIDBASEDEF 2.0 02/05/2023 1654   O2SAT 50 02/05/2023 1654     Coagulation Profile: Recent Labs  Lab 04/30/24 1033 04/30/24 1916  INR 1.7* 1.9*    Cardiac Enzymes: No results for input(s): CKTOTAL, CKMB, CKMBINDEX, TROPONINI in the last 168 hours.  HbA1C: Hgb A1c MFr Bld  Date/Time Value Ref Range Status  01/02/2024 04:55 AM 4.6 (L) 4.8 - 5.6 % Final    Comment:    (NOTE) Diagnosis of Diabetes The following HbA1c ranges  recommended by the American Diabetes Association (ADA) may be used as an aid in the diagnosis of diabetes mellitus.  Hemoglobin             Suggested A1C NGSP%              Diagnosis  <5.7                   Non Diabetic  5.7-6.4                Pre-Diabetic  >6.4                   Diabetic  <7.0                   Glycemic control for                       adults with diabetes.    08/21/2023 07:10 AM 6.1 (H) 4.8 - 5.6 % Final    Comment:    (NOTE) Pre diabetes:          5.7%-6.4%  Diabetes:              >6.4%  Glycemic control for   <7.0% adults with diabetes     CBG: Recent Labs  Lab 05/01/24 1534 05/01/24 1927 05/01/24 2339 05/02/24 0532 05/02/24 0736  GLUCAP 143* 137* 112* 89 81    Review of Systems:   As above   Past Medical History:  He,  has a past medical history of A-fib (HCC), Allergic rhinitis, Arthritis, Asthma, Bilateral knee pain, CAD (coronary artery disease), Diabetes mellitus without complication (HCC), Diverticulosis (02/2012), Erectile dysfunction, GERD (gastroesophageal reflux disease), Hiatal hernia, HLD (hyperlipidemia), HTN (hypertension), Hydronephrosis determined by ultrasound (08/22/2015), Overactive bladder, Prediabetes, Pulmonary lesion, Ringing of ears, S/P TAVR (transcatheter aortic valve replacement) (02/25/2023), Severe aortic stenosis, Sleep apnea, Urinary frequency, Urinary urgency, and Wears glasses.   Surgical History:   Past Surgical History:  Procedure Laterality Date   ABDOMINAL AORTOGRAM W/LOWER EXTREMITY N/A 02/05/2023   Procedure: ABDOMINAL AORTOGRAM W/LOWER EXTREMITY;  Surgeon: Wendel Lurena POUR, MD;  Location: [facility] INVASIVE CV LAB;  Service: Cardiovascular;  Laterality: N/A;   CATARACT EXTRACTION W/PHACO Left 12/07/2021   Procedure: CATARACT EXTRACTION PHACO AND INTRAOCULAR LENS PLACEMENT (IOC);  Surgeon: Harrie Agent, MD;  Location: [facility] ORS;  Service: Ophthalmology;  Laterality: Left;  CDE 7.75   CATARACT EXTRACTION W/PHACO  Right 12/21/2021   Procedure: CATARACT EXTRACTION PHACO AND INTRAOCULAR LENS PLACEMENT (IOC);  Surgeon: Harrie Agent, MD;  Location: [facility] ORS;  Service: Ophthalmology;  Laterality: Right;  CDE: 8.00   CHOLECYSTECTOMY     COLONOSCOPY  02/2012   Dr. Debrah:  sigmoid, transverse, and ascending colon diverticulosis   COLONOSCOPY WITH PROPOFOL  N/A 01/13/2023   Procedure: COLONOSCOPY WITH PROPOFOL ;  Surgeon: Cindie Carlin POUR, DO;  Location: [facility] ENDO SUITE;  Service: Endoscopy;  Laterality: N/A;  1:00 pm, asa 3   FINGER SURGERY Right    INTRAOPERATIVE TRANSTHORACIC ECHOCARDIOGRAM N/A 02/25/2023   Procedure: INTRAOPERATIVE TRANSTHORACIC ECHOCARDIOGRAM;  Surgeon: Wonda Sharper, MD;  Location[facility] INVASIVE CV LAB;  Service: Open Heart Surgery;  Laterality: N/A;   prostate procedure  2017   per patient, was having trouble urinating    RIGHT HEART CATH AND CORONARY ANGIOGRAPHY N/A 02/05/2023   Procedure: RIGHT HEART CATH AND CORONARY ANGIOGRAPHY;  Surgeon: Wendel Lurena POUR, MD;  Location: [facility] INVASIVE CV LAB;  Service: Cardiovascular;  Laterality: N/A;   TEE WITHOUT CARDIOVERSION N/A 01/05/2024   Procedure: ECHOCARDIOGRAM, TRANSESOPHAGEAL;  Surgeon: Stacia Diannah SQUIBB, MD;  Location: [facility] ORS;  Service: Cardiovascular;  Laterality: N/A;   TONSILLECTOMY     TOTAL KNEE ARTHROPLASTY  2012   left (Dr. Dorian)   TOTAL KNEE ARTHROPLASTY Right 06/19/2015   Procedure: TOTAL KNEE ARTHROPLASTY;  Surgeon: Marcey Raman, MD;  Location: [facility] OR;  Service: Orthopedics;  Laterality: Right;   TRANSCATHETER AORTIC VALVE REPLACEMENT, TRANSFEMORAL N/A 02/25/2023   Procedure: Transcatheter Aortic Valve Replacement, Transfemoral;  Surgeon: Wonda Sharper, MD;  Location: [facility] INVASIVE CV LAB;  Service: Open Heart Surgery;  Laterality: N/A;     Social History:   reports that he quit smoking about 14 years ago. His smoking use included cigarettes. He smoked an average of 1 pack per day. He has never used smokeless tobacco. He reports that he  does not currently use alcohol after a past usage of about 1.0 - 2.0 standard drink of alcohol per week. He reports that he does not use drugs.   Family History:  His family history includes Cancer in his mother; Coronary artery disease in his father and paternal grandfather; Heart disease in his paternal grandfather; Uterine cancer in his mother. There is no history of Colon cancer, Esophageal cancer, Stomach cancer, Rectal cancer, or Liver disease.   Allergies Allergies[1]   Home Medications  Prior to Admission medications  Medication Sig Start Date End Date Taking? Authorizing Provider  acetaminophen  (TYLENOL ) 325 MG tablet Take 2 tablets (650 mg total) by mouth every 6 (six) hours as needed for mild pain (pain score 1-3), moderate pain (pain score 4-6), fever or headache (or Fever >/= 101). 01/06/24   Johnson, [name], MD  apixaban  (ELIQUIS ) 5 MG TABS tablet Take 1 tablet (5 mg total) by mouth 2 (two) times daily. 08/24/15   Theophilus Andrews, Tully GRADE, MD  clotrimazole -betamethasone  (LOTRISONE ) cream Apply 1 Application topically in the morning, at noon, and at bedtime. To groin/scrotum/perineum for dermatitis    [provider]  diltiazem  (CARDIZEM  CD) 240 MG 24 hr capsule Take 1 capsule (240 mg total) by mouth daily. 01/06/24   Johnson, [name], MD  dutasteride  (AVODART ) 0.5 MG capsule Take 0.5 mg by mouth daily.    [provider]  ipratropium (ATROVENT ) 0.02 % nebulizer solution Take 2.5 mLs (0.5 mg total) by nebulization every 6 (six) hours as needed for wheezing. 01/06/24   Johnson, [name], MD  leptospermum manuka honey (MEDIHONEY) PSTE paste Apply 1 Application topically daily. Cleanse coccyx/buttocks with Vashe, apply Medihoney to wound beds daily and cover with dry gauze and silicone foam or ABD pad whichever is preferred. Interchangeable with TheraHoney Apply thin layer (3 mm) to wound. 01/06/24  Johnson, [name], MD  levalbuterol  (XOPENEX ) 0.63 MG/3ML  nebulizer solution Take 3 mLs (0.63 mg total) by nebulization every 6 (six) hours as needed for shortness of breath or wheezing. 01/06/24   Johnson, [name], MD  liver oil-zinc  oxide (DESITIN) 40 % ointment Apply topically 2 (two) times daily. Apply a thin layer of Desitin to intact skin of buttocks 2 times daily and prn soiling 01/06/24   Johnson, [name], MD  Magnesium  500 MG CAPS Take 500 mg by mouth daily.    [provider]  melatonin 5 MG TABS Take 10 mg by mouth at bedtime.    [provider]  metFORMIN  (GLUCOPHAGE -XR) 500 MG 24 hr tablet Take 1 tablet (500 mg total) by mouth daily with breakfast. 01/06/24   [name] Pen L, MD  metoprolol  tartrate (LOPRESSOR ) 25 MG tablet Take 1 tablet (25 mg total) by mouth 2 (two) times daily. 01/06/24   [name] Pen CROME, MD  Multiple Vitamin (MULTIVITAMIN WITH MINERALS) TABS tablet Take 1 tablet by mouth daily. 01/06/24   [name] Pen CROME, MD  Nutritional Supplement LIQD Take 120 mLs by mouth with breakfast, with lunch, and with evening meal. House Supplement    [provider]  oxybutynin  (DITROPAN ) 5 MG tablet Take 5 mg by mouth at bedtime. 06/01/19   [provider]  rosuvastatin  (CRESTOR ) 20 MG tablet Take 20 mg by mouth daily.    [provider]  senna-docusate (SENOKOT-S) 8.6-50 MG tablet Take 1 tablet by mouth at bedtime as needed for mild constipation. 01/06/24   Johnson, [name], MD  vitamin C  (ASCORBIC ACID ) 500 MG tablet Take 500 mg by mouth daily.    [provider]  zinc  sulfate, 50mg  elemental zinc , 220 (50 Zn) MG capsule Take 1 capsule (220 mg total) by mouth daily. 01/06/24   [name] Pen CROME, MD           [1]  Allergies Allergen Reactions   Diovan [Valsartan] Cough   Tagamet [Cimetidine] Other (See Comments)    Irritability

## 2024-05-03 DIAGNOSIS — A419 Sepsis, unspecified organism: Secondary | ICD-10-CM | POA: Diagnosis not present

## 2024-05-03 LAB — URINE CULTURE
Culture: >=100000 — AB
Culture: >=100000 — AB

## 2024-05-03 LAB — BASIC METABOLIC PANEL WITH GFR
Anion gap: 7 (ref 5–15)
BUN: 18 mg/dL (ref 8–23)
CO2: 20 mmol/L — ABNORMAL LOW (ref 22–32)
Calcium: 8.1 mg/dL — ABNORMAL LOW (ref 8.9–10.3)
Chloride: 114 mmol/L — ABNORMAL HIGH (ref 98–111)
Creatinine, Ser: 0.72 mg/dL (ref 0.61–1.24)
GFR, Estimated: >60 mL/min (ref >60)
Glucose, Bld: 82 mg/dL (ref 70–99)
Potassium: 3.9 mmol/L (ref 3.5–5.1)
Sodium: 141 mmol/L (ref 135–145)

## 2024-05-03 LAB — GLUCOSE, CAPILLARY
Glucose-Capillary: 79 mg/dL (ref 70–99)
Glucose-Capillary: 80 mg/dL (ref 70–99)
Glucose-Capillary: 87 mg/dL (ref 70–99)
Glucose-Capillary: 92 mg/dL (ref 70–99)
Glucose-Capillary: 94 mg/dL (ref 70–99)
Glucose-Capillary: 96 mg/dL (ref 70–99)

## 2024-05-03 MED ORDER — DILTIAZEM HCL 60 MG PO TABS
60.0000 mg | ORAL_TABLET | Freq: Four times a day (QID) | ORAL | Status: DC
  Administered 2024-05-03 – 2024-05-05 (×6): 60 mg via ORAL
  Filled 2024-05-03 (×6): qty 1

## 2024-05-03 MED ORDER — LINEZOLID 600 MG/300ML IV SOLN
600.0000 mg | Freq: Two times a day (BID) | INTRAVENOUS | Status: DC
  Administered 2024-05-03 – 2024-05-04 (×3): 600 mg via INTRAVENOUS
  Filled 2024-05-03 (×3): qty 300

## 2024-05-03 MED ORDER — MUPIROCIN 2 % EX OINT
1.0000 | TOPICAL_OINTMENT | Freq: Two times a day (BID) | CUTANEOUS | Status: DC
  Administered 2024-05-03 – 2024-05-06 (×5): 1 via NASAL
  Filled 2024-05-03 (×3): qty 22

## 2024-05-03 MED ORDER — APIXABAN 5 MG PO TABS
5.0000 mg | ORAL_TABLET | Freq: Two times a day (BID) | ORAL | Status: DC
  Administered 2024-05-03 – 2024-05-06 (×6): 5 mg via ORAL
  Filled 2024-05-03 (×6): qty 1

## 2024-05-03 MED ORDER — APIXABAN 5 MG PO TABS: 5.0000 mg | ORAL_TABLET | Freq: Two times a day (BID) | ORAL | Status: DC

## 2024-05-03 MED ORDER — ROSUVASTATIN CALCIUM 5 MG PO TABS
5.0000 mg | ORAL_TABLET | Freq: Every day | ORAL | Status: DC
  Administered 2024-05-03 – 2024-05-05 (×3): 5 mg via ORAL
  Filled 2024-05-03 (×3): qty 1

## 2024-05-03 NOTE — Consult Note (Signed)
 WOC Nurse Consult Note:  WOC consult noted. WOC consulted on 12/13, orders remain appropriate, see consult note.  Will not re consult at this time.  Thank you,  Doyal Polite, MSN, RN, [facility] WOC Team [phone] (Available Mon-Fri 0700-1500)

## 2024-05-03 NOTE — Evaluation (Signed)
 Occupational Therapy Evaluation and Discharge Patient Details Name: Gary Carroll MRN: [id number] DOB: [birth year] Today's Date: 05/03/2024   History of Present Illness   78 y.o. male admitted to [facility] from SNF on 04/30/24 unresponsive with fever and hypoxia. 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 PMH includes PAF on Eliquis , CAD, HFpEF, DM2, rectal adenocarcinoma.     Clinical Impressions Pt is from [facility] facility where he is a resident. He reports needing assist for the past year with ADL. He can groom and feed himself, but requires assist for all other aspects (bathing/dressing/toileting) He had been working with therapy initially - but not in a long time and has been primarily bed bound. 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 Today Gary Carroll participated in grooming tasks, and bed level exercises (very limited ROM and painful for Pt), attempted rolling with total A. Pt was unable to tolerate being put in chair position in the bed. As Pt is at baseline for ADL and self-care/transfers OT will sign off at this time and defer to LTC facility with cooperation from Palliative Care. Thank you for the opportunity to serve this patient.      If plan is discharge home, recommend the following:   Two people to help with walking and/or transfers;A lot of help with bathing/dressing/bathroom     Functional Status Assessment   Patient has had a recent decline in their functional status and/or demonstrates limited ability to make significant improvements in function in a reasonable and predictable amount of time     Equipment Recommendations   None recommended by OT     Recommendations for Other Services         Precautions/Restrictions   Precautions Precautions: Fall Recall of Precautions/Restrictions: Impaired Restrictions Weight Bearing Restrictions Per Provider Order:  No     Mobility Bed Mobility Overal bed mobility: Needs Assistance Bed Mobility: Rolling Rolling: Total assist         General bed mobility comments: totalA for partial rolling; pt attempting to pull on bilateral bed rails from elevated HOB    Transfers                   General transfer comment: deferred - will require +2 assist and lift equipment      Balance                                           ADL either performed or assessed with clinical judgement   ADL Overall ADL's : At baseline                                             Vision Baseline Vision/History: 1 Wears glasses Patient Visual Report: No change from baseline       Perception         Praxis         Pertinent Vitals/Pain Pain Assessment Pain Assessment: Faces Faces Pain Scale: Hurts little more Pain Location: back, buttocks, BLEs with AAROM Pain Descriptors / Indicators: Discomfort, Grimacing, Guarding Pain Intervention(s): Monitored during session, Repositioned     Extremity/Trunk Assessment Upper Extremity Assessment Upper Extremity Assessment: Generalized weakness   Lower Extremity Assessment  Lower Extremity Assessment: Defer to PT evaluation       Communication Communication Communication: No apparent difficulties   Cognition Arousal: Alert Behavior During Therapy: [facility] for tasks assessed/performed                                 Following commands: Intact Following commands impaired: Only follows one step commands consistently     Cueing  General Comments   Cueing Techniques: Verbal cues      Exercises Exercises: Low Level/ICU Low Level/ICU Exercises Ankle Circles/Pumps: AROM, Right, Left, 10 reps, Supine (very limited 5-10 degrees ROM) Hip ABduction/ADduction: AAROM, Right, Left, 10 reps, Supine (very limited ROM 10-15 degrees) Heel Slides: AAROM, Both, 10 reps, Supine (one at a time, very limited  ROM) Shoulder Flexion: AROM, Both, 10 reps, Supine (HOB elevated) Elbow Flexion: AROM, Both, 10 reps, Supine (HOB elevated)   Shoulder Instructions      Home Living Family/patient expects to be discharged to:: Skilled nursing facility                                 Additional Comments: from [facility]      Prior Functioning/Environment Prior Level of Function : Needs assist             Mobility Comments: reports no longer working with therapies at SNF (was getting to wheelchair with them), since that stopped has been bedbound. per chart, prior to admission 08/2023 was household ambulator with RW, lviing alone ADLs Comments: assist for all ADLs bed-level, able to feed self, do some grooming    OT Problem List: Obesity;Increased edema   OT Treatment/Interventions:        OT Goals(Current goals can be found in the care plan section)   Acute Rehab OT Goals Patient Stated Goal: be as independent as possible as long as possible OT Goal Formulation: With patient Time For Goal Achievement: 05/17/24 Potential to Achieve Goals: Fair   OT Frequency:       Co-evaluation              AM-PAC OT 6 Clicks Daily Activity     Outcome Measure Help from another person eating meals?: A Little Help from another person taking care of personal grooming?: A Little Help from another person toileting, which includes using toliet, bedpan, or urinal?: Total Help from another person bathing (including washing, rinsing, drying)?: A Lot Help from another person to put on and taking off regular upper body clothing?: A Lot Help from another person to put on and taking off regular lower body clothing?: Total 6 Click Score: 12   End of Session Nurse Communication: Mobility status  Activity Tolerance: Patient tolerated treatment well Patient left: in bed;with call bell/phone within reach;with bed alarm set;with SCD's reapplied  OT Visit Diagnosis: Muscle weakness  (generalized) (M62.81);Adult, failure to thrive (R62.7)                Time: 9063-8982 OT Time Calculation (min): 41 min Charges:  OT General Charges $OT Visit: 1 Visit OT Evaluation $OT Eval Low Complexity: 1 Low OT Treatments $Self Care/Home Management : 8-22 mins $Therapeutic Exercise: 8-22 mins  Leita DEL OTR/L Acute Rehabilitation Services Office: [phone]  Leita PARAS [facility] 05/03/2024, 10:21 AM

## 2024-05-03 NOTE — Progress Notes (Addendum)
 This chaplain responded to PMT NP-Michelle referral for creating the Pt. HCPOA. The Pt. is with the medical team at the time of the visit. This chaplain will plan a revisit.  **1400 This chaplain is present with the Pt. only. The chaplain introduced herself and confirmed with the Pt., his friend-Pat just left the bedside. When the chaplain asked about his interest in completing an AD, the Pt. communicates he has filled out the [facility], but he has one more contact to make before he is ready to complete the AD. The chaplain agreed to pause.  The chaplain listened reflectively as the Pt. talked about his regrets, one of which is travel. The Pt. has a list of places in [place] he thinks about visiting. The chaplain joined the Pt. in revisiting the places in his memory and exploring how his health may impact his travel.  The Pt. agreed to F/U on Tuesday.  Chaplain Leeroy Hummer [phone]

## 2024-05-03 NOTE — Progress Notes (Signed)
 Palliative Medicine Inpatient Follow Up Note HPI: 78 year old male with past medical history significant for severe aortic stenosis status post TAVR, paroxysmal atrial fibrillation on Eliquis , CAD, rectal adenocarcinoma, diabetes, and HFpEF. 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 Palliative care has been asked to support goals of care conversations.   Today's Discussion 05/03/2024  *Please note that this is a verbal dictation therefore any spelling or grammatical errors are due to the Dragon Medical One system interpretation.  I reviewed the chart notes including nursing notes from today, progress notes from today. I also reviewed vital signs, nursing flowsheets, medication administrations record, labs, and imaging.    Oral Intake %:  100% I/O:  (-) Bowel Movements:  Last today Mobility: Limited overall as PT has stopped working for the most part, has been weakening likely in the setting of his cancer  I met with Carlin at bedside this morning. He is awake and alert. He shares with me that he does not have any concerns this morning other than his current living situation. Created space and opportunity for patient to explore thoughts feelings and fears regarding current medical situation. He states that he had given up his apartment and wants to verify he has a safe long term living situation. We reviewed that he is a long term resident at [facility]. We reviewed that he also has follow up care with [facility]  Patients goals are to gain strength and have some degree of independence again. Concerns over his rectal carcinoma and likely recurrent hospitalizations brought to light. He is hoping additional conversations can be held with his friend, Nurse, Mental Health.  __________________ Addendum:  Have called patient friend, Bruna to set up a meeting to determine goals moving forward. A HIPAA compliant VM has been  left.  ___________________  Bruna called back we discussed the above. We discussed the improtance getting advanced directives done during hospitalization. Bruna would be willing to be patients HCPOA.   Bruna feels strongly that if we are able to get a better idea of prognosis then patient would be more able to make decisions moving forward. I shared that I would reach out to the primary team regarding this.  _________________  A meeting was held with patients friend Bruna and Kordel. We discussed patients current condition and his decline over the last year. We reviewed that he is now bed bound. We discussed his prognosis regarding his rectal carcinoma is being sought though globally, Nial is not doing well.   Advanced directives were reviewed and the importance of these documents completion highlight during conversation. Patient and Bruna plan to complete these.  The idea of hospice was brought up. Patient shares he does desire rehospitalization is he has a treatable illness therefore at this time is not ready for hospice. He is however willing to learn more about their services when he returns to [facility].   Questions and concerns addressed/Palliative Support Provided.   Objective Assessment: Vital Signs Vitals:   05/03/24 0759 05/03/24 0800  BP:  108/78  Pulse:  (!) 127  Resp:  (!) 22  Temp: 97.9 F (36.6 C)   SpO2:  97%    Intake/Output Summary (Last 24 hours) at 05/03/2024 0940 Last data filed at 05/03/2024 0800 Gross per 24 hour  Intake 850 ml  Output 1825 ml  Net -975 ml   Last Weight  Most recent update: 05/03/2024  6:52 AM  Weight  92.6 kg (204 lb 2.3 oz)            Gen: Elderly Caucasian male chronically ill appearing HEENT: moist mucous membranes CV: Irregular rate and rhythm  PULM: On room air breathing is even and nonlabored ABD: soft/nontender  EXT: Multiple areas of ecchymosis on bilateral upper extremities Neuro: Alert and oriented x3  SUMMARY OF  RECOMMENDATIONS   DNAR/DNI   Continue current care allowing time for outcomes treating what is treatable    Open and honest conversations held in the setting of patient disease burden and overall decline in the last year  Appreciate chaplain helping with AD's   Discussed Palliative care versus hospice --> As of presently patient is follow by [facility]  at [facility] which he would like to continue he does not feel ready for hospice   Ongoing palliative care support ______________________________________________________________________________________ Rosaline Becton [facility] Palliative Medicine Team Team Cell Phone: [phone] Please utilize secure chat with additional questions, if there is no response within 30 minutes please call the above phone number  Time: 27 Billing based on MDM: High   Palliative Medicine Team providers are available by phone from 7am to 7pm daily and can be reached through the team cell phone.  Should this patient require assistance outside of these hours, please call the patient's attending physician.

## 2024-05-03 NOTE — Progress Notes (Signed)
 Patient transferred to 5W room 7 without any complication, all belongings place at bedside.

## 2024-05-03 NOTE — Progress Notes (Addendum)
 Progress Note   Patient: Gary Carroll FMW:[id number] DOB: 08/24/1945 DOA: 04/30/2024     3 DOS: the patient was seen and examined on 05/03/2024 at 8:25AM      Brief hospital course: 78 y.o. 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  Found to have septic shock.      Assessment and Plan: Septic shock due to UTI Urine culture today growing MRSA and VRE. - Narrow to Linezolid    Rectal cancer Rectal fistula Appreciate Surgery expertise.  Agree, nothing to suggest fistula is infected.  Source more likely urine.  ADDENDUM: Patient admitted for rectal bleeding Sep 2024, colonoscopy showed mass in the distal rectum, biopsy confirmed adenoCA.  CT scan on 01/23/23 showed thickening of rectum, lymphadenopathy in the hilum and mediastinum and upper abdomen and inguinal adenopathy.  Follow up MRI 01/28/23 showed T4N0 tumor measureing 5.9cm, located 0 cm from the sphincter.  Treated with radiation by Dr. Dewey in Fall 2024, concluded Jan 2025.  Subsequently managed by Drs Flynn and Russell at the [facility] in [place].  He did see Dr. Teresa from [facility] Colorectal surgery in Oct 2024 and again after radiation in Feb 2025.  At that second visit, he was to undergo chemo subsequently.  It's unclear if that happened.    From Dr. Joleen notes, a complete clinical response is possible with radiation and chemotherapy, but if not, surgery is recommended typically.  However, the patient is a poor surgical candidate, and for this reason (and because he would not countenance a colostomy under any circumstances) he refused surgery categorically.  He was supposed to follow up with Dr. Teresa again in August but did not. - Request outside records and then consult Oncology    Atrial flutter In flutter rates 120s.  Not sinus tach. - Continue metoprolol  - Resume  diltiazem  in IR form for now - Transition back to Eliquis  - Supp K   Severe aortic stenosis s/p TAVR   Recent strep bacteremia Evidently had E coli bacteremia in May 2025 (at that time, urine and blood cultures congruent).  Then had group C strep bacteremia in August (urine culture negative at that time, TEE negative for vegetations, treated with 2 weeks antibiotics and resolved; hospitalist note at that time suspects urinary source, ID sign off note demurs on identifying source)  Blood cultures this amdission negative.   Hypertension Coronary artery disease Chronic diastolic CHF BP normal to soft - Contniuie metoprolol  - Resume Crestor  and diltiazem   Chronic urinary retention - Continue foley  Moderate protein calorie malnutrition - Consult dietitian  Type 2 diabetes Glucose low - Continue SS corrections  Acute renal failure Resolved  Normocytic anemia Stable, no bleeding  Sacral decubitus ulcer, unstageable, present on admission - Consult WOC  Hypokalemia - Supplement K         Subjective: Tired, but no fever, no no abdominal pain, no chest pain, no shortness of breath.     Physical Exam: BP 108/78 (BP Location: Left Arm)   Pulse (!) 127   Temp 97.9 F (36.6 C) (Oral)   Resp (!) 22   Ht 5' 11 (1.803 m)   Wt 92.6 kg   SpO2 97%   BMI 28.47 kg/m   Elderly adult male, disheveled, watching something on his computer Tachycardic, regular, no murmurs, no peripheral edema Respiratory rate seems normal, lung sounds diminished  overall but no rales or wheezes appreciated Abdomen soft, no tenderness palpation or guarding, no ascites or distention Attention normal, affect normal, cognitive impairment seems mild and at baseline, oriented to [place], but not the month or year, face symmetric, speech fluent, generalized weakness but symmetric strength    Data Reviewed: Basic metabolic panel shows hypokalemia, normal renal function CBC shows  leukocytosis, anemia Discussed with general surgery service    Family Communication:     Disposition: Status is: Inpatient         Author: Lonni SHAUNNA Dalton, MD 05/03/2024 11:18 AM  For on call review [email or web address].

## 2024-05-03 NOTE — TOC Initial Note (Signed)
 Transition of Care [facility]) - Initial/Assessment Note    Patient Details  Name: Gary Carroll MRN: [id number] Date of Birth: 1945/12/28  [facility] CM/SW Contact:    Lauraine FORBES Saa, LCSWA Phone Number: 05/03/2024, 9:47 AM  Clinical Narrative:                  9:47 AM Per chart review, patient is from [facility]. SNF confirmed patient is LTC at [facility]. Palliative Care NP informed CSW of patient plan to discharge back with outpatient palliative care. CSW made SNF aware. CSW sent patient's FL2 to SNF. CSW will continue to follow.   Expected Discharge Plan: Long Term Nursing Home Barriers to Discharge: Continued Medical Work up   Patient Goals and CMS Choice            Expected Discharge Plan and Services In-house Referral: Clinical Social Work   Post Acute Care Choice: Skilled Nursing Facility Living arrangements for the past 2 months: Skilled Nursing Facility                                      Prior Living Arrangements/Services Living arrangements for the past 2 months: Skilled Nursing Facility Lives with:: Facility Resident Patient language and need for interpreter reviewed:: Yes        Need for Family Participation in Patient Care: Yes (Comment) Care giver support system in place?: Yes (comment) Current home services: DME Criminal Activity/Legal Involvement Pertinent to Current Situation/Hospitalization: No - Comment as needed  Activities of Daily Living   ADL Screening (condition at time of admission) Independently performs ADLs?: No Does the patient have a NEW difficulty with bathing/dressing/toileting/self-feeding that is expected to last >3 days?: No Does the patient have a NEW difficulty with getting in/out of bed, walking, or climbing stairs that is expected to last >3 days?: No Does the patient have a NEW difficulty with communication that is expected to last >3 days?: No Is the patient deaf or have difficulty hearing?:  Yes Does the patient have difficulty seeing, even when wearing glasses/contacts?: Yes Does the patient have difficulty concentrating, remembering, or making decisions?: Yes  Permission Sought/Granted Permission sought to share information with : Family Supports, Oceanographer granted to share information with : No (Contact information on chart)  Share Information with NAME: Nurse, Mental Health  Permission granted to share info w AGENCY: [facility] L[facility]  Permission granted to share info w Relationship: Friend  Permission granted to share info w Contact Information: [phone]  Emotional Assessment       Orientation: : Oriented to Self, Oriented to Place, Oriented to  Time Alcohol / Substance Use: Not Applicable Psych Involvement: No (comment)  Admission diagnosis:  Sepsis (HCC) [A41.9] Acute sepsis [facility]) [A41.9] Patient Active Problem List   Diagnosis Date Noted   Acute sepsis (HCC) 04/30/2024   Hypotensive episode 12/30/2023   Streptococcal bacteremia 12/30/2023   Lactic acidosis 12/30/2023   Septic shock (HCC) 12/30/2023   Sepsis (HCC) 12/28/2023   Adjustment disorder with depressed mood 09/26/2023   Problem related to unspecified psychosocial circumstances 09/26/2023   Sepsis secondary to UTI (HCC) 09/23/2023   COVID-19 08/21/2023   Transportation insecurity 03/07/2023   Presence of intraocular lens 03/07/2023   Other secondary cataract, right eye 03/07/2023   Osteopenia 03/07/2023   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 with unspecified severity (HCC)  03/07/2023   Hyperglycemia, unspecified 03/07/2023   Disease of anus and rectum, unspecified 03/07/2023   Chronic venous insufficiency 03/07/2023   Bilateral ankle joint pain 03/07/2023   Age-related nuclear cataract, bilateral 03/07/2023   CAD (coronary artery disease) 02/25/2023   (HFpEF) heart failure with preserved ejection fraction (HCC) 02/25/2023   S/P TAVR  (transcatheter aortic valve replacement) 02/25/2023   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   Acute hypoxemic respiratory failure (HCC) 01/27/2023   Nonrheumatic aortic (valve) stenosis 01/27/2023   Primary adenocarcinoma of colon (HCC) 01/27/2023   Insomnia 01/27/2023   Elevated troponin 12/21/2022   Hemorrhoids 08/29/2022   Other fecal abnormalities 06/24/2022   Urethritis 05/01/2022   Candidiasis of penis 05/01/2022   Hardening of the aorta (main artery of the heart) 04/29/2022   Complication of diabetes mellitus (HCC) 04/29/2022   Chronic obstructive pulmonary disease (HCC) 04/29/2022   Pressure injury of skin of sacral region 10/18/2021   Edema of both lower extremities 10/18/2021   Other urogenital candidiasis 09/29/2021   Dysuria 09/29/2021   Upper respiratory infection 07/05/2021   Restrictive lung disease 06/26/2021   Multiple nodules of lung 04/30/2021   Otalgia of left ear 04/04/2021   Acute otitis media 04/04/2021   Tinea cruris 03/19/2021   Chronic anticoagulation 03/14/2021   Aortic stenosis 03/14/2021   Pressure injury of skin of buttock 02/03/2021   Acute respiratory failure with hypoxia and hypercapnia (HCC)    Abnormal computed tomography scan 01/01/2021   History of tobacco use 10/23/2020   Sleep apnea 09/25/2020   Type 2 diabetes mellitus (HCC) 09/25/2020   Senile osteoporosis 09/25/2020   Retention of urine 09/25/2020   Morbid obesity (HCC) 09/25/2020   Chronic combined systolic and diastolic heart failure (HCC) 09/25/2020   Lower urinary tract symptoms due to benign prostatic hyperplasia 09/25/2020   Chronic postoperative pain 03/19/2018   Elevated transaminase level    Obstructive sleep apnea syndrome 10/01/2017   Benign prostatic hyperplasia with lower urinary tract symptoms 02/04/2017   Urinary tract infection 02/04/2017   Cellulitis of left lower extremity 02/03/2017   Gastroesophageal reflux disease without esophagitis 02/03/2017    AF (paroxysmal atrial fibrillation) (HCC) 02/03/2017   Bladder outlet obstruction 08/22/2015   Hydronephrosis determined by ultrasound 08/22/2015   AKI (acute kidney injury) 08/22/2015   Hypernatremia 08/22/2015   Acute blood loss anemia 08/22/2015   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   Primary osteoarthritis of right knee 06/09/2015   Chronic pain of right knee 05/23/2015   Peripheral edema 08/31/2012   Urinary frequency 08/31/2012   Diverticulosis 03/23/2012   Impaired fasting glucose 02/06/2012   Decreased libido 03/07/2011   Erectile dysfunction 03/07/2011   Essential hypertension 12/20/2010   Hyperlipidemia, unspecified 12/20/2010   History of cellulitis 05/02/2010   PCP:  Clinic, [facility] Pharmacy:  No Pharmacies Listed    Social Drivers of Health (SDOH) Social History: SDOH Screenings   Food Insecurity: No Food Insecurity (04/30/2024)  Housing: Low Risk (04/30/2024)  Transportation Needs: No Transportation Needs (04/30/2024)  Utilities: Not At Risk (04/30/2024)  Alcohol Screen: Low Risk (02/12/2023)  Depression (PHQ2-9): Low Risk (10/10/2023)  Recent Concern: Depression (PHQ2-9) - Medium Risk (09/26/2023)  Financial Resource Strain: Low Risk (09/26/2023)  Physical Activity: Inactive (09/26/2023)  Social Connections: Moderately Isolated (04/30/2024)  Stress: No Stress Concern Present (09/26/2023)  Tobacco Use: Medium Risk (05/01/2024)  Health Literacy: Adequate Health Literacy (03/07/2023)   SDOH Interventions:     Readmission Risk Interventions  12/29/2023    8:50 AM 09/24/2023    2:43 PM 02/27/2023   11:39 AM  Readmission Risk Prevention Plan  Transportation Screening Complete Complete Complete  PCP or Specialist Appt within 3-5 Days  Not Complete   HRI or Home Care Consult   Complete  Social Work Consult for Recovery Care Planning/Counseling  Complete Complete  Palliative Care Screening  Not Applicable Not Applicable  Medication Review  Oceanographer) Complete Complete Complete  HRI or Home Care Consult Complete    SW Recovery Care/Counseling Consult Complete    Palliative Care Screening Not Applicable    Skilled Nursing Facility Complete

## 2024-05-03 NOTE — Progress Notes (Signed)
 Subjective/Chief Complaint: No complaints today, he is having flatus/bms   Objective: Vital signs in last 24 hours: Temp:  [97.9 F (36.6 C)-99 F (37.2 C)] 97.9 F (36.6 C) (12/15 0759) Pulse Rate:  [104-133] 127 (12/15 0800) Resp:  [16-30] 22 (12/15 0800) BP: (91-119)/(66-90) 108/78 (12/15 0800) SpO2:  [91 %-99 %] 97 % (12/15 0800) Weight:  [92.6 kg] 92.6 kg (12/15 0600) Last BM Date : 05/03/24  Intake/Output from previous day: 12/14 0701 - 12/15 0700 In: 860.6 [P.O.:50; IV Piggyback:810.6] Out: 1925 [Urine:1925] Intake/Output this shift: Total I/O In: 40.5 [IV Piggyback:40.5] Out: -   Ab soft nontender, unable to examine anorectal this am  Lab Results:  Recent Labs    04/30/24 1916 05/01/24 0257  WBC 24.8* 23.2*  HGB 9.7* 10.1*  HCT 31.9* 33.9*  PLT 130* 148*   BMET Recent Labs    05/02/24 0213 05/03/24 0140  NA 141 141  K 3.1* 3.9  CL 112* 114*  CO2 21* 20*  GLUCOSE 88 82  BUN 23 18  CREATININE 0.67 0.72  CALCIUM  8.3* 8.1*   PT/INR Recent Labs    04/30/24 1033 04/30/24 1916  LABPROT 20.7* 22.3*  INR 1.7* 1.9*   ABG No results for input(s): PHART, HCO3 in the last 72 hours.  Invalid input(s): PCO2, PO2  Studies/Results: ECHOCARDIOGRAM COMPLETE Result Date: 05/02/2024    ECHOCARDIOGRAM REPORT   Patient Name:   Gary Carroll Date of Exam: 05/02/2024 Medical Rec #:  [id number]          Height:       71.0 in Accession #:    [id number]         Weight:       208.3 lb Date of Birth:  1946-01-17         BSA:          2.145 m Patient Age:    78 years           BP:           114/75 mmHg Patient Gender: M                  HR:           128 bpm. Exam Location:  Inpatient Procedure: 2D Echo, Cardiac Doppler, Color Doppler and Intracardiac            Opacification Agent (Both Spectral and Color Flow Doppler were            utilized during procedure). Indications:    Shock  History:        Patient has prior history of Echocardiogram  examinations, most                 recent 01/05/2024. CAD, Abnormal ECG, Aortic Valve Disease,                 Arrythmias:Atrial Fibrillation, Signs/Symptoms:Bacteremia and                 Edema; Risk Factors:Hypertension, Dyslipidemia and Sleep Apnea.                 Aortic stenosis. TAVR.                 Aortic Valve: 26 mm Edwards Sapien prosthetic, stented (TAVR)                 valve is present in the aortic position. Procedure Date:  02/25/2023.  Sonographer:    Ellouise Mose RDCS Referring Phys: [id number] [facility] N HATTAR  Sonographer Comments: Technically difficult study due to poor echo windows. IMPRESSIONS  1. Left ventricular ejection fraction, by estimation, is >75%. The left ventricle has hyperdynamic function. The left ventricle has no regional wall motion abnormalities. There is mild left ventricular hypertrophy. Left ventricular diastolic parameters are indeterminate.  2. Right ventricular systolic function is normal. The right ventricular size is normal.  3. The mitral valve is normal in structure. No evidence of mitral valve regurgitation. No evidence of mitral stenosis.  4. The aortic valve has been repaired/replaced. Aortic valve regurgitation is not visualized. Mild aortic valve stenosis. 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 Procedure Date: 02/25/2023. Echo findings are consistent with normal structure and function of the aortic valve prosthesis.  5. The inferior vena cava is dilated in size with >50% respiratory variability, suggesting right atrial pressure of 8 mmHg. FINDINGS  Left Ventricle: Left ventricular ejection fraction, by estimation, is >75%. The left ventricle has hyperdynamic function. The left ventricle has no regional wall motion abnormalities. The left ventricular internal cavity size was normal in size. There is mild left ventricular hypertrophy. Left ventricular diastolic parameters are indeterminate. Right Ventricle: The  right ventricular size is normal. Right ventricular systolic function is normal. Left Atrium: Left atrial size was normal in size. Right Atrium: Right atrial size was normal in size. Pericardium: There is no evidence of pericardial effusion. Mitral Valve: The mitral valve is normal in structure. Mild mitral annular calcification. No evidence of mitral valve regurgitation. No evidence of mitral valve stenosis. Tricuspid Valve: The tricuspid valve is normal in structure. Tricuspid valve regurgitation is mild . No evidence of tricuspid stenosis. Aortic Valve: The aortic valve has been repaired/replaced. Aortic valve regurgitation is not visualized. Mild aortic stenosis is present. Aortic valve mean gradient measures 5.7 mmHg. Aortic valve peak gradient measures 10.8 mmHg. Aortic valve area, by VTI measures 3.49 cm. 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 Procedure Date: 02/25/2023. Echo findings are consistent with normal structure and function of the aortic valve prosthesis. Pulmonic Valve: The pulmonic valve was not well visualized. Pulmonic valve regurgitation is not visualized. No evidence of pulmonic stenosis. Aorta: The aortic root is normal in size and structure. Venous: The inferior vena cava is dilated in size with greater than 50% respiratory variability, suggesting right atrial pressure of 8 mmHg. IAS/Shunts: No atrial level shunt detected by color flow Doppler.  LEFT VENTRICLE PLAX 2D LVIDd:         4.70 cm     Diastology LVIDs:         2.90 cm     LV e' medial:    7.18 cm/s LV PW:         1.20 cm     LV E/e' medial:  14.1 LV IVS:        1.00 cm     LV e' lateral:   11.90 cm/s LVOT diam:     2.60 cm     LV E/e' lateral: 8.5 LV SV:         76 LV SV Index:   36 LVOT Area:     5.31 cm  LV Volumes (MOD) LV vol d, MOD A2C: 61.8 ml LV vol d, MOD A4C: 71.6 ml LV vol s, MOD A2C: 31.2 ml LV vol s, MOD A4C: 24.8 ml  LV SV MOD A2C:     30.6 ml LV SV MOD A4C:      71.6 ml LV SV MOD BP:      41.3 ml RIGHT VENTRICLE             IVC RV S prime:     17.20 cm/s  IVC diam: 2.10 cm TAPSE (M-mode): 0.9 cm LEFT ATRIUM             Index        RIGHT ATRIUM           Index LA diam:        4.90 cm 2.28 cm/m   RA Area:     14.90 cm LA Vol (A2C):   19.4 ml 9.04 ml/m   RA Volume:   38.80 ml  18.08 ml/m LA Vol (A4C):   27.0 ml 12.58 ml/m LA Biplane Vol: 23.8 ml 11.09 ml/m  AORTIC VALVE AV Area (Vmax):    4.01 cm AV Area (Vmean):   3.96 cm AV Area (VTI):     3.49 cm AV Vmax:           164.33 cm/s AV Vmean:          108.567 cm/s AV VTI:            0.219 m AV Peak Grad:      10.8 mmHg AV Mean Grad:      5.7 mmHg LVOT Vmax:         124.00 cm/s LVOT Vmean:        81.000 cm/s LVOT VTI:          0.144 m LVOT/AV VTI ratio: 0.66  AORTA Ao Root diam: 3.30 cm Ao Asc diam:  3.40 cm MITRAL VALVE MV Area (PHT): 4.39 cm     SHUNTS MV Decel Time: 173 msec     Systemic VTI:  0.14 m MV E velocity: 101.00 cm/s  Systemic Diam: 2.60 cm Redell Shallow MD Electronically signed by Redell Shallow MD Signature Date/Time: 05/02/2024/11:37:19 AM    Final     Anti-infectives: Anti-infectives (From admission, onward)    Start     Dose/Rate Route Frequency Ordered Stop   05/02/24 1400  ceFEPIme  (MAXIPIME ) 2 g in sodium chloride  0.9 % 100 mL IVPB        2 g 200 mL/hr over 30 Minutes Intravenous Every 8 hours 05/02/24 1134     05/02/24 1230  vancomycin  (VANCOREADY) IVPB 1250 mg/250 mL        1,250 mg 166.7 mL/hr over 90 Minutes Intravenous Every 12 hours 05/02/24 1133     04/30/24 1800  metroNIDAZOLE  (FLAGYL ) IVPB 500 mg  Status:  Discontinued        500 mg 100 mL/hr over 60 Minutes Intravenous Every 12 hours 04/30/24 1657 05/02/24 0811   04/30/24 1800  ceFEPIme  (MAXIPIME ) 2 g in sodium chloride  0.9 % 100 mL IVPB  Status:  Discontinued        2 g 200 mL/hr over 30 Minutes Intravenous Every 12 hours 04/30/24 1659 05/02/24 1134   04/30/24 1500  vancomycin  (VANCOREADY) IVPB 2000 mg/400 mL         2,000 mg 200 mL/hr over 120 Minutes Intravenous  Once 04/30/24 1426 04/30/24 1718   04/30/24 1430  vancomycin  (VANCOCIN ) IVPB 1000 mg/200 mL premix  Status:  Discontinued        1,000 mg 200 mL/hr over 60 Minutes Intravenous  Once 04/30/24 1416 04/30/24 1426   04/30/24  1030  cefTRIAXone  (ROCEPHIN ) 2 g in sodium chloride  0.9 % 100 mL IVPB        2 g 200 mL/hr over 30 Minutes Intravenous Once 04/30/24 1015 04/30/24 1109       Assessment/Plan: HD 3- sepsis requiring vasopressors 78 year old male with history of rectal carcinoma Hypertension  A-fib CAD Diabetes - abx's per medical service - history of advanced colorectal/anal adenocarcinoma - likely with incontinence secondary to infiltration of the rectal mass - CT scan reviewed - 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

## 2024-05-03 NOTE — Hospital Course (Signed)
 78 y.o. 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  Found to have septic shock.

## 2024-05-03 NOTE — NC FL2 (Signed)
 [place]  MEDICAID [id number] LEVEL OF CARE FORM     IDENTIFICATION  Patient Name: Gary Carroll Birthdate: [birth year] Sex: male Admission Date (Current Location): 04/30/2024  [facility] and [place] Number:  [employer] and Address:  [facility] [place], [address], [place], [place] [address]      Provider Number: [id number]  Attending Physician Name and Address:  Jonel Lonni SQUIBB, *  Relative Name and Phone Number:  Bruna Copa; Friend; [phone]    Current Level of Care: Hospital Recommended Level of Care: Skilled Nursing Facility Prior Approval Number:    Date Approved/Denied:   PASRR Number: [id number]  Discharge Plan: SNF    Current Diagnoses: Patient Active Problem List   Diagnosis Date Noted   Acute sepsis (HCC) 04/30/2024   Hypotensive episode 12/30/2023   Streptococcal bacteremia 12/30/2023   Lactic acidosis 12/30/2023   Septic shock (HCC) 12/30/2023   Sepsis (HCC) 12/28/2023   Adjustment disorder with depressed mood 09/26/2023   Problem related to unspecified psychosocial circumstances 09/26/2023   Sepsis secondary to UTI (HCC) 09/23/2023   COVID-19 08/21/2023   Transportation insecurity 03/07/2023   Presence of intraocular lens 03/07/2023   Other secondary cataract, right eye 03/07/2023   Osteopenia 03/07/2023   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 with unspecified severity (HCC) 03/07/2023   Hyperglycemia, unspecified 03/07/2023   Disease of anus and rectum, unspecified 03/07/2023   Chronic venous insufficiency 03/07/2023   Bilateral ankle joint pain 03/07/2023   Age-related nuclear cataract, bilateral 03/07/2023   CAD (coronary artery disease) 02/25/2023   (HFpEF) heart failure with preserved ejection fraction (HCC) 02/25/2023   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   Acute hypoxemic respiratory failure  (HCC) 01/27/2023   Nonrheumatic aortic (valve) stenosis 01/27/2023   Primary adenocarcinoma of colon (HCC) 01/27/2023   Insomnia 01/27/2023   Elevated troponin 12/21/2022   Hemorrhoids 08/29/2022   Other fecal abnormalities 06/24/2022   Urethritis 05/01/2022   Candidiasis of penis 05/01/2022   Hardening of the aorta (main artery of the heart) 04/29/2022   Complication of diabetes mellitus (HCC) 04/29/2022   Chronic obstructive pulmonary disease (HCC) 04/29/2022   Pressure injury of skin of sacral region 10/18/2021   Edema of both lower extremities 10/18/2021   Other urogenital candidiasis 09/29/2021   Dysuria 09/29/2021   Upper respiratory infection 07/05/2021   Restrictive lung disease 06/26/2021   Multiple nodules of lung 04/30/2021   Otalgia of left ear 04/04/2021   Acute otitis media 04/04/2021   Tinea cruris 03/19/2021   Chronic anticoagulation 03/14/2021   Aortic stenosis 03/14/2021   Pressure injury of skin of buttock 02/03/2021   Acute respiratory failure with hypoxia and hypercapnia (HCC)    Abnormal computed tomography scan 01/01/2021   History of tobacco use 10/23/2020   Sleep apnea 09/25/2020   Type 2 diabetes mellitus (HCC) 09/25/2020   Senile osteoporosis 09/25/2020   Retention of urine 09/25/2020   Morbid obesity (HCC) 09/25/2020   Chronic combined systolic and diastolic heart failure (HCC) 09/25/2020   Lower urinary tract symptoms due to benign prostatic hyperplasia 09/25/2020   Chronic postoperative pain 03/19/2018   Elevated transaminase level    Obstructive sleep apnea syndrome 10/01/2017   Benign prostatic hyperplasia with lower urinary tract symptoms 02/04/2017   Urinary tract infection 02/04/2017   Cellulitis of left lower extremity 02/03/2017   Gastroesophageal reflux disease without esophagitis 02/03/2017   AF (  paroxysmal atrial fibrillation) (HCC) 02/03/2017   Bladder outlet obstruction 08/22/2015   Hydronephrosis determined by ultrasound  08/22/2015   AKI (acute kidney injury) 08/22/2015   Hypernatremia 08/22/2015   Acute blood loss anemia 08/22/2015   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   Primary osteoarthritis of right knee 06/09/2015   Chronic pain of right knee 05/23/2015   Peripheral edema 08/31/2012   Urinary frequency 08/31/2012   Diverticulosis 03/23/2012   Impaired fasting glucose 02/06/2012   Decreased libido 03/07/2011   Erectile dysfunction 03/07/2011   Essential hypertension 12/20/2010   Hyperlipidemia, unspecified 12/20/2010   History of cellulitis 05/02/2010    Orientation RESPIRATION BLADDER Height & Weight     Self, Time, Place  Normal (Room Air) Incontinent, Indwelling catheter Weight: 204 lb 2.3 oz (92.6 kg) Height:  5' 11 (180.3 cm)  BEHAVIORAL SYMPTOMS/MOOD NEUROLOGICAL BOWEL NUTRITION STATUS      Incontinent Diet (Please see discharge summary)  AMBULATORY STATUS COMMUNICATION OF NEEDS Skin   Extensive Assist Verbally PU Stage and Appropriate Care (Pressure Injury Sacrum Mid Deep Tissue Pressure Injury - Purple or maroon localized area of discolored intact skin or blood-filled blister due to damage of underlying soft tissue from pressure and/or shear.)                       Personal Care Assistance Level of Assistance  Bathing, Feeding, Dressing Bathing Assistance: Maximum assistance Feeding assistance: Maximum assistance Dressing Assistance: Maximum assistance     Functional Limitations Info  Sight, Hearing Sight Info: Impaired (R and L) Hearing Info: Impaired      SPECIAL CARE FACTORS FREQUENCY  PT (By licensed PT), OT (By licensed OT)     PT Frequency: 5x OT Frequency: 5x            Contractures Contractures Info: Not present    Additional Factors Info  Code Status, Allergies, Insulin  Sliding Scale Code Status Info: DNR-LIMITED -Do Not Intubate/DNI Allergies Info: Diovan (valsartan) and Tagamet (cimetidine)   Insulin  Sliding Scale Info: Please see  discharge summary       Current Medications (05/03/2024):  This is the current hospital active medication list Current Facility-Administered Medications  Medication Dose Route Frequency Provider Last Rate Last Admin   acetaminophen  (TYLENOL ) suppository 650 mg  650 mg Rectal Once Zammit, Joseph, MD       ceFEPIme  (MAXIPIME ) 2 g in sodium chloride  0.9 % 100 mL IVPB  2 g Intravenous Q8H Merilee Linsey I, [facility]   Stopped at 05/03/24 9287   Chlorhexidine  Gluconate Cloth 2 % PADS 6 each  6 each Topical Daily Zaida Zola SAILOR, MD   6 each at 05/03/24 9360   docusate sodium  (COLACE) capsule 100 mg  100 mg Oral BID PRN Hattar, Zola SAILOR, MD       enoxaparin  (LOVENOX ) injection 95 mg  95 mg Subcutaneous Q12H Hattar, Zola SAILOR, MD   95 mg at 05/03/24 9050   insulin  aspart (novoLOG ) injection 0-9 Units  0-9 Units Subcutaneous Q4H Hattar, Laith N, MD   1 Units at 05/01/24 2000   metoprolol  tartrate (LOPRESSOR ) tablet 25 mg  25 mg Oral Q8H Hattar, Zola SAILOR, MD   25 mg at 05/03/24 9390   mupirocin  ointment (BACTROBAN ) 2 % 1 Application  1 Application Nasal BID Danford, Lonni SQUIBB, MD       polyethylene glycol (MIRALAX  / GLYCOLAX ) packet 17 g  17 g Oral Daily PRN Hattar, Zola SAILOR, MD  vancomycin  (VANCOREADY) IVPB 1250 mg/250 mL  1,250 mg Intravenous Q12H Merilee Linsey I, RPH 166.7 mL/hr at 05/03/24 0948 1,250 mg at 05/03/24 9051     Discharge Medications: Please see discharge summary for a list of discharge medications.  Relevant Imaging Results:  Relevant Lab Results:   Additional Information SSN: [id number]  Pauline Trainer E Finnley [place], LCSWA

## 2024-05-04 DIAGNOSIS — E43 Unspecified severe protein-calorie malnutrition: Secondary | ICD-10-CM | POA: Insufficient documentation

## 2024-05-04 DIAGNOSIS — A419 Sepsis, unspecified organism: Secondary | ICD-10-CM | POA: Diagnosis not present

## 2024-05-04 LAB — COMPREHENSIVE METABOLIC PANEL WITH GFR
ALT: 22 U/L (ref 0–44)
AST: 34 U/L (ref 15–41)
Albumin: 2 g/dL — ABNORMAL LOW (ref 3.5–5.0)
Alkaline Phosphatase: 59 U/L (ref 38–126)
Anion gap: 7 (ref 5–15)
BUN: 13 mg/dL (ref 8–23)
CO2: 22 mmol/L (ref 22–32)
Calcium: 8.4 mg/dL — ABNORMAL LOW (ref 8.9–10.3)
Chloride: 110 mmol/L (ref 98–111)
Creatinine, Ser: 0.65 mg/dL (ref 0.61–1.24)
GFR, Estimated: >60 mL/min (ref >60)
Glucose, Bld: 76 mg/dL (ref 70–99)
Potassium: 3.6 mmol/L (ref 3.5–5.1)
Sodium: 139 mmol/L (ref 135–145)
Total Bilirubin: 0.4 mg/dL (ref 0.0–1.2)
Total Protein: 5.2 g/dL — ABNORMAL LOW (ref 6.5–8.1)

## 2024-05-04 LAB — CBC
HCT: 30.5 % — ABNORMAL LOW (ref 39.0–52.0)
Hemoglobin: 9.2 g/dL — ABNORMAL LOW (ref 13.0–17.0)
MCH: 25.3 pg — ABNORMAL LOW (ref 26.0–34.0)
MCHC: 30.2 g/dL (ref 30.0–36.0)
MCV: 83.8 fL (ref 80.0–100.0)
Platelets: 101 K/uL — ABNORMAL LOW (ref 150–400)
RBC: 3.64 MIL/uL — ABNORMAL LOW (ref 4.22–5.81)
RDW: 18 % — ABNORMAL HIGH (ref 11.5–15.5)
WBC: 5.4 K/uL (ref 4.0–10.5)
nRBC: 0 % (ref 0.0–0.2)

## 2024-05-04 LAB — GLUCOSE, CAPILLARY
Glucose-Capillary: 76 mg/dL (ref 70–99)
Glucose-Capillary: 91 mg/dL (ref 70–99)
Glucose-Capillary: 104 mg/dL — ABNORMAL HIGH (ref 70–99)
Glucose-Capillary: 157 mg/dL — ABNORMAL HIGH (ref 70–99)
Glucose-Capillary: 93 mg/dL (ref 70–99)

## 2024-05-04 MED ORDER — ADULT MULTIVITAMIN W/MINERALS CH
1.0000 | ORAL_TABLET | Freq: Every day | ORAL | Status: DC
  Administered 2024-05-05 – 2024-05-06 (×2): 1 via ORAL
  Filled 2024-05-04 (×2): qty 1

## 2024-05-04 MED ORDER — MIDODRINE HCL 5 MG PO TABS
5.0000 mg | ORAL_TABLET | Freq: Two times a day (BID) | ORAL | Status: DC
  Administered 2024-05-04 – 2024-05-06 (×3): 5 mg via ORAL
  Filled 2024-05-04 (×3): qty 1

## 2024-05-04 MED ORDER — JUVEN PO PACK
1.0000 | PACK | Freq: Two times a day (BID) | ORAL | Status: DC
  Administered 2024-05-04: 22:00:00 1 via ORAL
  Filled 2024-05-04 (×2): qty 1

## 2024-05-04 MED ORDER — LINEZOLID 600 MG PO TABS
600.0000 mg | ORAL_TABLET | Freq: Two times a day (BID) | ORAL | Status: AC
  Administered 2024-05-04 – 2024-05-06 (×4): 600 mg via ORAL
  Filled 2024-05-04 (×4): qty 1

## 2024-05-04 MED ORDER — POTASSIUM CHLORIDE CRYS ER 20 MEQ PO TBCR
40.0000 meq | EXTENDED_RELEASE_TABLET | Freq: Once | ORAL | Status: AC
  Administered 2024-05-04: 15:00:00 40 meq via ORAL
  Filled 2024-05-04: qty 2

## 2024-05-04 MED ORDER — PROSOURCE PLUS PO LIQD: 30.0000 mL | Freq: Two times a day (BID) | ORAL | Status: DC

## 2024-05-04 NOTE — Progress Notes (Signed)
° °  Palliative Medicine Inpatient Follow Up Note HPI: 78 year old male with past medical history significant for severe aortic stenosis status post TAVR, paroxysmal atrial fibrillation on Eliquis , CAD, rectal adenocarcinoma, diabetes, and HFpEF. 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 Palliative care has been asked to support goals of care conversations.   Today's Discussion 05/04/2024  *Please note that this is a verbal dictation therefore any spelling or grammatical errors are due to the Dragon Medical One system interpretation.  I reviewed the chart notes including nursing notes from today, progress notes from today. I also reviewed vital signs, nursing flowsheets, medication administrations record, labs, and imaging.    Oral Intake %:  100% I/O:  (-) Bowel Movements:  12/16 Mobility: Limited overall as PT has stopped working for the most part, has been weakening likely in the setting of his cancer  I met with Gary Carroll at bedside this afternoon. He is awake and watching history documentaries on his laptop. He denies pain, nausea, sob.  We reviewed the importance of advanced care planning documents. We discussed patient has two confirmed friends who are willing to support being his HCPOA's. His friend, Yetta however was not wanting to be a management consultant for him. Discussed the plan for the Chaplain team to meet with him and help with completion of documents.  Questions and concerns addressed/Palliative Support Provided.  ____________________  [name] and spoke to [facility] to meet with patient and his friend, Bruna when he returns to [facility] to discuss more on hospice services.   Objective Assessment: Vital Signs Vitals:   05/04/24 0831 05/04/24 1200  BP: (!) 92/59 (!) 108/57  Pulse: 61 72  Resp: 17 20  Temp: 97.7 F (36.5 C) (!) 97.4 F (36.3 C)  SpO2: 98% 99%    Intake/Output  Summary (Last 24 hours) at 05/04/2024 1405 Last data filed at 05/04/2024 9049 Gross per 24 hour  Intake 850 ml  Output 700 ml  Net 150 ml   Last Weight  Most recent update: 05/04/2024  4:10 AM    Weight  92.1 kg (203 lb 0.7 oz)            Gen: Elderly Caucasian male chronically ill appearing HEENT: moist mucous membranes CV: Irregular rate and rhythm  PULM: On room air breathing is even and nonlabored ABD: soft/nontender  EXT: Multiple areas of ecchymosis on bilateral upper extremities Neuro: Alert and oriented x3  SUMMARY OF RECOMMENDATIONS   DNAR/DNI   Continue current care allowing time for outcomes treating what is treatable    Open and honest conversations held in the setting of patient disease burden and overall decline in the last year  Appreciate chaplain helping with AD's   Discussed Palliative care versus hospice --> As of presently patient is follow by [facility]  at [facility] which he would like to continue he does not feel ready for hospice   Ongoing palliative care support ______________________________________________________________________________________ Rosaline Becton [facility] Palliative Medicine Team Team Cell Phone: [phone] Please utilize secure chat with additional questions, if there is no response within 30 minutes please call the above phone number  Time: 52  Palliative Medicine Team providers are available by phone from 7am to 7pm daily and can be reached through the team cell phone.  Should this patient require assistance outside of these hours, please call the patient's attending physician.

## 2024-05-04 NOTE — TOC Progression Note (Signed)
 Transition of Care [facility]) - Progression Note    Patient Details  Name: Gary Carroll MRN: [id number] Date of Birth: 04-19-1946  Transition of Care [facility]) CM/SW Contact  Inocente GORMAN Kindle, LCSW Phone Number: 05/04/2024, 10:55 AM  Clinical Narrative:    CSW confirmed that [facility] uses [facility], [facility], or [facility] for OP Palliative Care.     Expected Discharge Plan: Long Term Nursing Home Barriers to Discharge: Continued Medical Work up               Expected Discharge Plan and Services In-house Referral: Clinical Social Work   Post Acute Care Choice: Skilled Nursing Facility Living arrangements for the past 2 months: Skilled Nursing Facility                                       Social Drivers of Health (SDOH) Interventions SDOH Screenings   Food Insecurity: No Food Insecurity (04/30/2024)  Housing: Low Risk (04/30/2024)  Transportation Needs: No Transportation Needs (04/30/2024)  Utilities: Not At Risk (04/30/2024)  Alcohol Screen: Low Risk (02/12/2023)  Depression (PHQ2-9): Low Risk (10/10/2023)  Recent Concern: Depression (PHQ2-9) - Medium Risk (09/26/2023)  Financial Resource Strain: Low Risk (09/26/2023)  Physical Activity: Inactive (09/26/2023)  Social Connections: Moderately Isolated (04/30/2024)  Stress: No Stress Concern Present (09/26/2023)  Tobacco Use: Medium Risk (05/01/2024)  Health Literacy: Adequate Health Literacy (03/07/2023)    Readmission Risk Interventions    12/29/2023    8:50 AM 09/24/2023    2:43 PM 02/27/2023   11:39 AM  Readmission Risk Prevention Plan  Transportation Screening Complete Complete Complete  PCP or Specialist Appt within 3-5 Days  Not Complete   HRI or Home Care Consult   Complete  Social Work Consult for Recovery Care Planning/Counseling  Complete Complete  Palliative Care Screening  Not Applicable Not Applicable  Medication Review Oceanographer) Complete Complete Complete  HRI or Home Care Consult Complete     SW Recovery Care/Counseling Consult Complete    Palliative Care Screening Not Applicable    Skilled Nursing Facility Complete

## 2024-05-04 NOTE — Plan of Care (Signed)
 Patient is progressing to discharge.  Patient has been cooperative with all interventions this shift.    Problem: Education: Goal: Knowledge of General Education information will improve Description: Including pain rating scale, medication(s)/side effects and non-pharmacologic comfort measures Outcome: Progressing   Problem: Health Behavior/Discharge Planning: Goal: Ability to manage health-related needs will improve Outcome: Progressing   Problem: Clinical Measurements: Goal: Ability to maintain clinical measurements within normal limits will improve Outcome: Progressing Goal: Will remain free from infection Outcome: Progressing Goal: Diagnostic test results will improve Outcome: Progressing Goal: Respiratory complications will improve Outcome: Progressing Goal: Cardiovascular complication will be avoided Outcome: Progressing   Problem: Activity: Goal: Risk for activity intolerance will decrease Outcome: Progressing   Problem: Nutrition: Goal: Adequate nutrition will be maintained Outcome: Progressing   Problem: Coping: Goal: Level of anxiety will decrease Outcome: Progressing   Problem: Elimination: Goal: Will not experience complications related to bowel motility Outcome: Progressing Goal: Will not experience complications related to urinary retention Outcome: Progressing   Problem: Pain Managment: Goal: General experience of comfort will improve and/or be controlled Outcome: Progressing   Problem: Safety: Goal: Ability to remain free from injury will improve Outcome: Progressing   Problem: Skin Integrity: Goal: Risk for impaired skin integrity will decrease Outcome: Progressing

## 2024-05-04 NOTE — Progress Notes (Incomplete)
 PROGRESS NOTE        PATIENT DETAILS Name: Gary Carroll Age: 78 y.o. Sex: male Date of Birth: 07/23/1945 Admit Date: 04/30/2024 Admitting Physician Zola LOISE Herter, MD ERE:Ropwpr, Bonni Lien  Brief Summary: Patient is a 78 y.o.  male history of aortic stenosis-s/p TAVR, PAF on Eliquis , chronic HFpEF, CAD, rectal adenocarcinoma-s/p chemoradiation (refused surgical excision)-admitted for septic shock in the setting of complicated UTI.  Significant events: 12/12>> admit from [facility] to [facility] ICU 12/15>> transferred to [facility]  Significant studies: 12/12>> CT abdomen/pelvis: No acute finding-related to cutaneous fistula below the scrotum 12/12>> CXR: No PNA  Significant microbiology data: 12/12>> COVID/influenza/RSV PCR: Negative 12/12>> urine culture: MRSA, Enterococcus faecalis 12/12>> blood culture: No growth  Procedures: None  Consults: PCCM [facility]CS Palliative care  Subjective: Lying comfortably in bed-denies any chest pain or shortness of breath.  Objective: Vitals: Blood pressure 106/64, pulse 90, temperature 97.6 F (36.4 C), temperature source Oral, resp. rate 20, height 5' 11 (1.803 m), weight 92.1 kg, SpO2 97%.   Exam: Gen Exam:Alert awake-not in any distress HEENT:atraumatic, normocephalic Chest: B/L clear to auscultation anteriorly CVS:S1S2 regular Abdomen:soft non tender, non distended Extremities:no edema Neurology: Non focal Skin: no rash  Pertinent Labs/Radiology:    Latest Ref Rng & Units 05/04/2024    3:37 AM 05/01/2024    2:57 AM 04/30/2024    7:16 PM  CBC  WBC 4.0 - 10.5 K/uL 5.4  23.2  24.8   Hemoglobin 13.0 - 17.0 g/dL 9.2  89.8  9.7   Hematocrit 39.0 - 52.0 % 30.5  33.9  31.9   Platelets 150 - 400 K/uL 101  148  130     Lab Results  Component Value Date   NA 139 05/04/2024   K 3.6 05/04/2024   CL 110 05/04/2024   CO2 22 05/04/2024      Assessment/Plan: Septic shock Secondary to complicated  UTI-culture data as above-blood cultures remain negative so far (recent history of group C strep bacteremia August 2025-TEE negative for vegetation) Sepsis physiology has improved with antibiotics 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-no complicated factors seen on CT abdomen-not felt to be related to current presentation Continue Zyvox   Rectal cancer with rectocutaneous fistula Appears to have completed chemo/radiation at the [facility] system General Surgery following-patient has declined surgical options  AKI Likely hemodynamically mediated Resolved  Hypokalemia Repleted  Paroxysmal atrial fibs/flutter Rate controlled with metoprolol /Cardizem  Eliquis  Telemetry monitoring  History of severe aortic stenosis-s/p TAVR 2024 Supportive care  History of CAD No anginal symptoms  Chronic HFpEF Euvolemic  HTN BP soft-requiring low-dose midodrine -on metoprolol /Cardizem  mostly for rate control  HLD Statin  Normocytic anemia Likely chronic normocytic anemia related to malignancy-worsened by critical illness Follow CBC  Thrombocytopenia Watch closely-as on Zyvox   Chronic urinary retention Foley remains in place-appears to have been placed since his most recent discharge in August 2025.  DM-2 (A1c 4.6 on 8/15) CBG stable-SSI  Recent Labs    05/04/24 0830 05/04/24 1209 05/04/24 1607  GLUCAP 91 104* 157*     Scattered tiny subpleural pulmonary nodules<5 mm Incidental finding Radiology does not recommend any further follow-up imaging  Palliative care DNR/DNI Palliative care following-recommendations are to continue gentle medical treatment Will need palliative/hospice follow-up at SNF  Nutrition Status: Nutrition Problem: Severe Malnutrition Etiology: chronic illness Signs/Symptoms: severe muscle depletion, percent weight loss Interventions: MVI, Prostat, Liberalize Diet,  Magic cup, Refer to RD note for recommendations  Pressure Ulcer: Agree with assessment as outlined  below Wound 05/01/24 0800 Pressure Injury Sacrum Mid Deep Tissue Pressure Injury - Purple or maroon localized area of discolored intact skin or blood-filled blister due to damage of underlying soft tissue from pressure and/or shear. (Active)    BMI/Obesity/Underweight***: Estimated body mass index is 28.32 kg/m as calculated from the following:   Height as of this encounter: 5' 11 (1.803 m).   Weight as of this encounter: 92.1 kg.   Code status:   Code Status: Limited: Do not attempt resuscitation (DNR) -DNR-LIMITED -Do Not Intubate/DNI    DVT Prophylaxis: SCDs Start: 04/30/24 1639 apixaban  (ELIQUIS ) tablet 5 mg     Family Communication: None at bedside   Disposition Plan: Status is: Inpatient Remains inpatient appropriate because: Severity of illness   Planned Discharge Destination:Skilled nursing facility   Diet: Diet Order             Diet regular Room service appropriate? Yes; Fluid consistency: Thin  Diet effective now                     Antimicrobial agents: Anti-infectives (From admission, onward)    Start     Dose/Rate Route Frequency Ordered Stop   05/04/24 2200  linezolid  (ZYVOX ) tablet 600 mg        600 mg Oral Every 12 hours 05/04/24 1010     05/03/24 1130  linezolid  (ZYVOX ) IVPB 600 mg  Status:  Discontinued        600 mg 300 mL/hr over 60 Minutes Intravenous Every 12 hours 05/03/24 1038 05/04/24 1010   05/02/24 1400  ceFEPIme  (MAXIPIME ) 2 g in sodium chloride  0.9 % 100 mL IVPB  Status:  Discontinued        2 g 200 mL/hr over 30 Minutes Intravenous Every 8 hours 05/02/24 1134 05/03/24 1038   05/02/24 1230  vancomycin  (VANCOREADY) IVPB 1250 mg/250 mL  Status:  Discontinued        1,250 mg 166.7 mL/hr over 90 Minutes Intravenous Every 12 hours 05/02/24 1133 05/03/24 1038   04/30/24 1800  metroNIDAZOLE  (FLAGYL ) IVPB 500 mg  Status:  Discontinued        500 mg 100 mL/hr over 60 Minutes Intravenous Every 12 hours 04/30/24 1657 05/02/24 0811    04/30/24 1800  ceFEPIme  (MAXIPIME ) 2 g in sodium chloride  0.9 % 100 mL IVPB  Status:  Discontinued        2 g 200 mL/hr over 30 Minutes Intravenous Every 12 hours 04/30/24 1659 05/02/24 1134   04/30/24 1500  vancomycin  (VANCOREADY) IVPB 2000 mg/400 mL        2,000 mg 200 mL/hr over 120 Minutes Intravenous  Once 04/30/24 1426 04/30/24 1718   04/30/24 1430  vancomycin  (VANCOCIN ) IVPB 1000 mg/200 mL premix  Status:  Discontinued        1,000 mg 200 mL/hr over 60 Minutes Intravenous  Once 04/30/24 1416 04/30/24 1426   04/30/24 1030  cefTRIAXone  (ROCEPHIN ) 2 g in sodium chloride  0.9 % 100 mL IVPB        2 g 200 mL/hr over 30 Minutes Intravenous Once 04/30/24 1015 04/30/24 1109        MEDICATIONS: Scheduled Meds:  [START ON 05/05/2024] (feeding supplement) PROSource Plus  30 mL Oral BID BM   acetaminophen   650 mg Rectal Once   apixaban   5 mg Oral BID   Chlorhexidine  Gluconate Cloth  6 each Topical Daily  diltiazem   60 mg Oral Q6H   insulin  aspart  0-9 Units Subcutaneous Q4H   linezolid   600 mg Oral Q12H   metoprolol  tartrate  25 mg Oral Q8H   midodrine   5 mg Oral BID WC   [START ON 05/05/2024] multivitamin with minerals  1 tablet Oral Daily   mupirocin  ointment  1 Application Nasal BID   nutrition supplement (JUVEN)  1 packet Oral BID AC & HS   rosuvastatin   5 mg Oral QHS   Continuous Infusions: PRN Meds:.docusate sodium , polyethylene glycol   I have personally reviewed following labs and imaging studies  LABORATORY DATA: CBC: Recent Labs  Lab 04/30/24 1033 04/30/24 1916 05/01/24 0257 05/04/24 0337  WBC 27.4* 24.8* 23.2* 5.4  NEUTROABS 26.0*  --   --   --   HGB 10.9* 9.7* 10.1* 9.2*  HCT 36.4* 31.9* 33.9* 30.5*  MCV 86.3 85.8 85.8 83.8  PLT 160 130* 148* 101*    Basic Metabolic Panel: Recent Labs  Lab 04/30/24 1916 05/01/24 0257 05/02/24 0213 05/03/24 0140 05/04/24 0337  NA 138 139 141 141 139  K 3.8 3.9 3.1* 3.9 3.6  CL 110 110 112* 114* 110  CO2 21* 20*  21* 20* 22  GLUCOSE 139* 158* 88 82 76  BUN 23 18 23 18 13   CREATININE 0.89 0.71 0.67 0.72 0.65  CALCIUM  7.6* 7.9* 8.3* 8.1* 8.4*  MG 1.5* 2.9*  --   --   --   PHOS 3.6 4.7*  --   --   --     GFR: Estimated Creatinine Clearance: 86.8 mL/min (by C-G formula based on SCr of 0.65 mg/dL).  Liver Function Tests: Recent Labs  Lab 04/30/24 1033 04/30/24 1916 05/04/24 0337  AST 43* 32 34  ALT 21 19 22   ALKPHOS 119 76 59  BILITOT 0.4 0.4 0.4  PROT 6.6 5.4* 5.2*  ALBUMIN 3.2* 1.9* 2.0*   No results for input(s): LIPASE, AMYLASE in the last 168 hours. No results for input(s): AMMONIA in the last 168 hours.  Coagulation Profile: Recent Labs  Lab 04/30/24 1033 04/30/24 1916  INR 1.7* 1.9*    Cardiac Enzymes: No results for input(s): CKTOTAL, CKMB, CKMBINDEX, TROPONINI in the last 168 hours.  BNP (last 3 results) No results for input(s): PROBNP in the last 8760 hours.  Lipid Profile: No results for input(s): CHOL, HDL, LDLCALC, TRIG, CHOLHDL, LDLDIRECT in the last 72 hours.  Thyroid Function Tests: No results for input(s): TSH, T4TOTAL, FREET4, T3FREE, THYROIDAB in the last 72 hours.  Anemia Panel: No results for input(s): VITAMINB12, FOLATE, FERRITIN, TIBC, IRON, RETICCTPCT in the last 72 hours.  Urine analysis:    Component Value Date/Time   COLORURINE YELLOW 04/30/2024 1042   APPEARANCEUR TURBID (A) 04/30/2024 1042   LABSPEC 1.024 04/30/2024 1042   PHURINE 5.0 04/30/2024 1042   GLUCOSEU NEGATIVE 04/30/2024 1042   HGBUR MODERATE (A) 04/30/2024 1042   BILIRUBINUR SMALL (A) 04/30/2024 1042   BILIRUBINUR neg 08/31/2012 1203   KETONESUR NEGATIVE 04/30/2024 1042   PROTEINUR >=300 (A) 04/30/2024 1042   UROBILINOGEN negative 08/31/2012 1203   UROBILINOGEN 0.2 05/31/2010 1125   NITRITE NEGATIVE 04/30/2024 1042   LEUKOCYTESUR MODERATE (A) 04/30/2024 1042    Sepsis Labs: Lactic Acid, Venous    Component Value  Date/Time   LATICACIDVEN 2.3 (HH) 04/30/2024 1916    MICROBIOLOGY: Recent Results (from the past 240 hours)  Blood Culture (routine x 2)     Status: None (Preliminary result)   Collection  Time: 04/30/24 10:33 AM   Specimen: BLOOD  Result Value Ref Range Status   Specimen Description BLOOD BLOOD RIGHT ARM  Final   Special Requests   Final    BOTTLES DRAWN AEROBIC AND ANAEROBIC Blood Culture adequate volume   Culture   Final    NO GROWTH 4 DAYS Performed at [facility], [address] [place], [place] [address]    Report Status PENDING  Incomplete  Blood Culture (routine x 2)     Status: None (Preliminary result)   Collection Time: 04/30/24 10:33 AM   Specimen: BLOOD  Result Value Ref Range Status   Specimen Description BLOOD BLOOD LEFT ARM  Final   Special Requests   Final    BOTTLES DRAWN AEROBIC AND ANAEROBIC Blood Culture results may not be optimal due to an inadequate volume of blood received in culture bottles   Culture   Final    NO GROWTH 4 DAYS Performed at [facility], [address], [place], [place] [address]    Report Status PENDING  Incomplete  Urine Culture     Status: Abnormal   Collection Time: 04/30/24 10:42 AM   Specimen: Urine, Random  Result Value Ref Range Status   Specimen Description   Final    URINE, RANDOM Performed at [facility], [address], [place], [place] [address]    Special Requests   Final    NONE Reflexed from [id number] Performed at [facility], [address] [place], [place] [address]    Culture (A)  Final    >=100,000 COLONIES/mL METHICILLIN RESISTANT STAPHYLOCOCCUS AUREUS >=100,000 COLONIES/mL ENTEROCOCCUS FAECALIS VANCOMYCIN  RESISTANT ENTEROCOCCUS ISOLATED    Report Status 05/03/2024 FINAL  Final   Organism ID, Bacteria METHICILLIN RESISTANT STAPHYLOCOCCUS AUREUS (A)  Final   Organism ID, Bacteria ENTEROCOCCUS FAECALIS (A)  Final      Susceptibility   Enterococcus faecalis - MIC*    AMPICILLIN <=2 SENSITIVE Sensitive      NITROFURANTOIN  <=16 SENSITIVE Sensitive     VANCOMYCIN  >=32 RESISTANT Resistant     LINEZOLID  2 SENSITIVE Sensitive     * >=100,000 COLONIES/mL ENTEROCOCCUS FAECALIS   Methicillin resistant staphylococcus aureus - MIC*    CIPROFLOXACIN  >=8 RESISTANT Resistant     GENTAMICIN <=0.5 SENSITIVE Sensitive     NITROFURANTOIN  <=16 SENSITIVE Sensitive     OXACILLIN >=4 RESISTANT Resistant     TETRACYCLINE <=1 SENSITIVE Sensitive     VANCOMYCIN  1 SENSITIVE Sensitive     TRIMETH/SULFA <=10 SENSITIVE Sensitive     RIFAMPIN <=0.5 SENSITIVE Sensitive     Inducible Clindamycin NEGATIVE Sensitive     LINEZOLID  2 SENSITIVE Sensitive     * >=100,000 COLONIES/mL METHICILLIN RESISTANT STAPHYLOCOCCUS AUREUS  Resp panel by RT-PCR (RSV, Flu A&B, Covid) Anterior Nasal Swab     Status: None   Collection Time: 04/30/24 10:43 AM   Specimen: Anterior Nasal Swab  Result Value Ref Range Status   SARS Coronavirus 2 by RT PCR NEGATIVE NEGATIVE Final    Comment: (NOTE) SARS-CoV-2 target nucleic acids are NOT DETECTED.  The SARS-CoV-2 RNA is generally detectable in upper respiratory specimens during the acute phase of infection. The lowest concentration of SARS-CoV-2 viral copies this assay can detect is 138 copies/mL. A negative result does not preclude SARS-Cov-2 infection and should not be used as the sole basis for treatment or other patient management decisions. A negative result may occur with  improper specimen collection/handling, submission of specimen other than nasopharyngeal swab, presence of viral mutation(s) within  the areas targeted by this assay, and inadequate number of viral copies(<138 copies/mL). A negative result must be combined with clinical observations, patient history, and epidemiological information. The expected result is Negative.  Fact Sheet for Patients:  bloggercourse.com  Fact Sheet for Healthcare Providers:   seriousbroker.it  This test is no t yet approved or cleared by the [place]  FDA and  has been authorized for detection and/or diagnosis of SARS-CoV-2 by FDA under an Emergency Use Authorization (EUA). This EUA will remain  in effect (meaning this test can be used) for the duration of the COVID-19 declaration under Section 564(b)(1) of the Act, 21 U.S.C.section 360bbb-3(b)(1), unless the authorization is terminated  or revoked sooner.       Influenza A by PCR NEGATIVE NEGATIVE Final   Influenza B by PCR NEGATIVE NEGATIVE Final    Comment: (NOTE) The Xpert Xpress SARS-CoV-2/FLU/RSV plus assay is intended as an aid in the diagnosis of influenza from Nasopharyngeal swab specimens and should not be used as a sole basis for treatment. Nasal washings and aspirates are unacceptable for Xpert Xpress SARS-CoV-2/FLU/RSV testing.  Fact Sheet for Patients: bloggercourse.com  Fact Sheet for Healthcare Providers: seriousbroker.it  This test is not yet approved or cleared by the [place]  FDA and has been authorized for detection and/or diagnosis of SARS-CoV-2 by FDA under an Emergency Use Authorization (EUA). This EUA will remain in effect (meaning this test can be used) for the duration of the COVID-19 declaration under Section 564(b)(1) of the Act, 21 U.S.C. section 360bbb-3(b)(1), unless the authorization is terminated or revoked.     Resp Syncytial Virus by PCR NEGATIVE NEGATIVE Final    Comment: (NOTE) Fact Sheet for Patients: bloggercourse.com  Fact Sheet for Healthcare Providers: seriousbroker.it  This test is not yet approved or cleared by the [place]  FDA and has been authorized for detection and/or diagnosis of SARS-CoV-2 by FDA under an Emergency Use Authorization (EUA). This EUA will remain in effect (meaning this test can be used) for  the duration of the COVID-19 declaration under Section 564(b)(1) of the Act, 21 U.S.C. section 360bbb-3(b)(1), unless the authorization is terminated or revoked.  Performed at [facility], [address] [place], [place] [address]   Remove and replace urinary cath (placed > 5 days) then obtain urine culture from new indwelling urinary catheter.     Status: Abnormal   Collection Time: 04/30/24 12:16 PM   Specimen: Urine, Catheterized  Result Value Ref Range Status   Specimen Description   Final    URINE, CATHETERIZED Performed at [facility], [address], [place], [place] [address]    Special Requests   Final    NONE Performed at [facility], [address] [place], [place] [address]    Culture (A)  Final    >=100,000 COLONIES/mL METHICILLIN RESISTANT STAPHYLOCOCCUS AUREUS 50,000 COLONIES/mL ENTEROCOCCUS FAECALIS VANCOMYCIN  RESISTANT ENTEROCOCCUS ISOLATED    Report Status 05/03/2024 FINAL  Final   Organism ID, Bacteria METHICILLIN RESISTANT STAPHYLOCOCCUS AUREUS (A)  Final   Organism ID, Bacteria ENTEROCOCCUS FAECALIS (A)  Final      Susceptibility   Enterococcus faecalis - MIC*    AMPICILLIN <=2 SENSITIVE Sensitive     NITROFURANTOIN  <=16 SENSITIVE Sensitive     VANCOMYCIN  >=32 RESISTANT Resistant     * 50,000 COLONIES/mL ENTEROCOCCUS FAECALIS   Methicillin resistant staphylococcus aureus - MIC*    CIPROFLOXACIN  >=8 RESISTANT Resistant     GENTAMICIN <=0.5 SENSITIVE Sensitive     NITROFURANTOIN  <=16 SENSITIVE Sensitive  OXACILLIN >=4 RESISTANT Resistant     TETRACYCLINE <=1 SENSITIVE Sensitive     VANCOMYCIN  1 SENSITIVE Sensitive     TRIMETH/SULFA <=10 SENSITIVE Sensitive     RIFAMPIN <=0.5 SENSITIVE Sensitive     Inducible Clindamycin NEGATIVE Sensitive     LINEZOLID  2 SENSITIVE Sensitive     * >=100,000 COLONIES/mL METHICILLIN RESISTANT STAPHYLOCOCCUS AUREUS  MRSA Next Gen by PCR, Nasal     Status: Abnormal   Collection Time: 04/30/24  4:51 PM   Specimen:  Nasal Mucosa; Nasal Swab  Result Value Ref Range Status   MRSA by PCR Next Gen DETECTED (A) NOT DETECTED Final    Comment: (NOTE) The GeneXpert MRSA Assay (FDA approved for NASAL specimens only), is one component of a comprehensive MRSA colonization surveillance program. It is not intended to diagnose MRSA infection nor to guide or monitor treatment for MRSA infections. Test performance is not FDA approved in patients less than 17 years old. Performed at [facility], [address], [place], [place] [address]     RADIOLOGY STUDIES/RESULTS: No results found.   LOS: 4 days   Donalda Applebaum, MD  [facility]    To contact the attending provider between 7A-7P or the covering provider during after hours 7P-7A, please log into the web site [email or web address] and access using universal [place] password for that web site. If you do not have the password, please call the hospital operator.  05/04/2024, 8:31 PM

## 2024-05-04 NOTE — Progress Notes (Signed)
 Progress Note     Subjective: Patient denies new concerns. Having bowel function and flatulence. Denies n/v. Eating breakfast during encounter.   ROS  All negative with the exception of above.  Objective: Vital signs in last 24 hours: Temp:  [97.7 F (36.5 C)-98.8 F (37.1 C)] 97.7 F (36.5 C) (12/16 0831) Pulse Rate:  [61-131] 61 (12/16 0831) Resp:  [17-24] 17 (12/16 0831) BP: (92-131)/(54-92) 92/59 (12/16 0831) SpO2:  [95 %-99 %] 98 % (12/16 0831) Weight:  [92.1 kg] 92.1 kg (12/16 0410) Last BM Date : 05/03/24  Intake/Output from previous day: 12/15 0701 - 12/16 0700 In: 640.5 [IV Piggyback:640.5] Out: 510 [Urine:510] Intake/Output this shift: Total I/O In: -  Out: 700 [Urine:700]  PE: General: Pleasant male who is laying in bed in NAD. HEENT: Head is normocephalic, atraumatic.  Heart: HR normal during encounter.  Lungs: Respiratory effort nonlabored. Abd: Soft. Non tender.  Lab Results:  Recent Labs    05/04/24 0337  WBC 5.4  HGB 9.2*  HCT 30.5*  PLT 101*   BMET Recent Labs    05/03/24 0140 05/04/24 0337  NA 141 139  K 3.9 3.6  CL 114* 110  CO2 20* 22  GLUCOSE 82 76  BUN 18 13  CREATININE 0.72 0.65  CALCIUM  8.1* 8.4*   PT/INR No results for input(s): LABPROT, INR in the last 72 hours. CMP     Component Value Date/Time   NA 139 05/04/2024 0337   K 3.6 05/04/2024 0337   CL 110 05/04/2024 0337   CO2 22 05/04/2024 0337   GLUCOSE 76 05/04/2024 0337   BUN 13 05/04/2024 0337   CREATININE 0.65 05/04/2024 0337   CREATININE 0.85 02/06/2012 0929   CALCIUM  8.4 (L) 05/04/2024 0337   PROT 5.2 (L) 05/04/2024 0337   ALBUMIN 2.0 (L) 05/04/2024 0337   AST 34 05/04/2024 0337   ALT 22 05/04/2024 0337   ALKPHOS 59 05/04/2024 0337   BILITOT 0.4 05/04/2024 0337   GFRNONAA >60 05/04/2024 0337   GFRAA >60 10/09/2017 0506   Lipase     Component Value Date/Time   LIPASE 36 09/23/2023 0042       Studies/Results: ECHOCARDIOGRAM  COMPLETE Result Date: 05/02/2024    ECHOCARDIOGRAM REPORT   Patient Name:   DENIEL MCQUISTON Date of Exam: 05/02/2024 Medical Rec #:  [id number]          Height:       71.0 in Accession #:    [id number]         Weight:       208.3 lb Date of Birth:  Sep 11, 1945         BSA:          2.145 m Patient Age:    78 years           BP:           114/75 mmHg Patient Gender: M                  HR:           128 bpm. Exam Location:  Inpatient Procedure: 2D Echo, Cardiac Doppler, Color Doppler and Intracardiac            Opacification Agent (Both Spectral and Color Flow Doppler were            utilized during procedure). Indications:    Shock  History:        Patient has prior history of Echocardiogram examinations,  most                 recent 01/05/2024. CAD, Abnormal ECG, Aortic Valve Disease,                 Arrythmias:Atrial Fibrillation, Signs/Symptoms:Bacteremia and                 Edema; Risk Factors:Hypertension, Dyslipidemia and Sleep Apnea.                 Aortic stenosis. TAVR.                 Aortic Valve: 26 mm Edwards Sapien prosthetic, stented (TAVR)                 valve is present in the aortic position. Procedure Date:                 02/25/2023.  Sonographer:    Ellouise Mose RDCS Referring Phys: [id number] [facility] N HATTAR  Sonographer Comments: Technically difficult study due to poor echo windows. IMPRESSIONS  1. Left ventricular ejection fraction, by estimation, is >75%. The left ventricle has hyperdynamic function. The left ventricle has no regional wall motion abnormalities. There is mild left ventricular hypertrophy. Left ventricular diastolic parameters are indeterminate.  2. Right ventricular systolic function is normal. The right ventricular size is normal.  3. The mitral valve is normal in structure. No evidence of mitral valve regurgitation. No evidence of mitral stenosis.  4. The aortic valve has been repaired/replaced. Aortic valve regurgitation is not visualized. Mild aortic valve stenosis. There is a  26 mm Edwards Sapien prosthetic (TAVR) valve present in the aortic position. Procedure Date: 02/25/2023. Echo findings are consistent with normal structure and function of the aortic valve prosthesis.  5. The inferior vena cava is dilated in size with >50% respiratory variability, suggesting right atrial pressure of 8 mmHg. FINDINGS  Left Ventricle: Left ventricular ejection fraction, by estimation, is >75%. The left ventricle has hyperdynamic function. The left ventricle has no regional wall motion abnormalities. The left ventricular internal cavity size was normal in size. There is mild left ventricular hypertrophy. Left ventricular diastolic parameters are indeterminate. Right Ventricle: The right ventricular size is normal. Right ventricular systolic function is normal. Left Atrium: Left atrial size was normal in size. Right Atrium: Right atrial size was normal in size. Pericardium: There is no evidence of pericardial effusion. Mitral Valve: The mitral valve is normal in structure. Mild mitral annular calcification. No evidence of mitral valve regurgitation. No evidence of mitral valve stenosis. Tricuspid Valve: The tricuspid valve is normal in structure. Tricuspid valve regurgitation is mild . No evidence of tricuspid stenosis. Aortic Valve: The aortic valve has been repaired/replaced. Aortic valve regurgitation is not visualized. Mild aortic stenosis is present. Aortic valve mean gradient measures 5.7 mmHg. Aortic valve peak gradient measures 10.8 mmHg. Aortic valve area, by VTI measures 3.49 cm. 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 Procedure Date: 02/25/2023. Echo findings are consistent with normal structure and function of the aortic valve prosthesis. Pulmonic Valve: The pulmonic valve was not well visualized. Pulmonic valve regurgitation is not visualized. No evidence of pulmonic stenosis. Aorta: The aortic root is normal in size and structure.  Venous: The inferior vena cava is dilated in size with greater than 50% respiratory variability, suggesting right atrial pressure of 8 mmHg. IAS/Shunts: No atrial level shunt detected by color flow Doppler.  LEFT VENTRICLE PLAX 2D LVIDd:  4.70 cm     Diastology LVIDs:         2.90 cm     LV e' medial:    7.18 cm/s LV PW:         1.20 cm     LV E/e' medial:  14.1 LV IVS:        1.00 cm     LV e' lateral:   11.90 cm/s LVOT diam:     2.60 cm     LV E/e' lateral: 8.5 LV SV:         76 LV SV Index:   36 LVOT Area:     5.31 cm  LV Volumes (MOD) LV vol d, MOD A2C: 61.8 ml LV vol d, MOD A4C: 71.6 ml LV vol s, MOD A2C: 31.2 ml LV vol s, MOD A4C: 24.8 ml LV SV MOD A2C:     30.6 ml LV SV MOD A4C:     71.6 ml LV SV MOD BP:      41.3 ml RIGHT VENTRICLE             IVC RV S prime:     17.20 cm/s  IVC diam: 2.10 cm TAPSE (M-mode): 0.9 cm LEFT ATRIUM             Index        RIGHT ATRIUM           Index LA diam:        4.90 cm 2.28 cm/m   RA Area:     14.90 cm LA Vol (A2C):   19.4 ml 9.04 ml/m   RA Volume:   38.80 ml  18.08 ml/m LA Vol (A4C):   27.0 ml 12.58 ml/m LA Biplane Vol: 23.8 ml 11.09 ml/m  AORTIC VALVE AV Area (Vmax):    4.01 cm AV Area (Vmean):   3.96 cm AV Area (VTI):     3.49 cm AV Vmax:           164.33 cm/s AV Vmean:          108.567 cm/s AV VTI:            0.219 m AV Peak Grad:      10.8 mmHg AV Mean Grad:      5.7 mmHg LVOT Vmax:         124.00 cm/s LVOT Vmean:        81.000 cm/s LVOT VTI:          0.144 m LVOT/AV VTI ratio: 0.66  AORTA Ao Root diam: 3.30 cm Ao Asc diam:  3.40 cm MITRAL VALVE MV Area (PHT): 4.39 cm     SHUNTS MV Decel Time: 173 msec     Systemic VTI:  0.14 m MV E velocity: 101.00 cm/s  Systemic Diam: 2.60 cm Redell Shallow MD Electronically signed by Redell Shallow MD Signature Date/Time: 05/02/2024/11:37:19 AM    Final     Anti-infectives: Anti-infectives (From admission, onward)    Start     Dose/Rate Route Frequency Ordered Stop   05/03/24 1130  linezolid  (ZYVOX ) IVPB 600  mg        600 mg 300 mL/hr over 60 Minutes Intravenous Every 12 hours 05/03/24 1038     05/02/24 1400  ceFEPIme  (MAXIPIME ) 2 g in sodium chloride  0.9 % 100 mL IVPB  Status:  Discontinued        2 g 200 mL/hr over 30 Minutes Intravenous Every 8 hours 05/02/24 1134 05/03/24 1038   05/02/24  1230  vancomycin  (VANCOREADY) IVPB 1250 mg/250 mL  Status:  Discontinued        1,250 mg 166.7 mL/hr over 90 Minutes Intravenous Every 12 hours 05/02/24 1133 05/03/24 1038   04/30/24 1800  metroNIDAZOLE  (FLAGYL ) IVPB 500 mg  Status:  Discontinued        500 mg 100 mL/hr over 60 Minutes Intravenous Every 12 hours 04/30/24 1657 05/02/24 0811   04/30/24 1800  ceFEPIme  (MAXIPIME ) 2 g in sodium chloride  0.9 % 100 mL IVPB  Status:  Discontinued        2 g 200 mL/hr over 30 Minutes Intravenous Every 12 hours 04/30/24 1659 05/02/24 1134   04/30/24 1500  vancomycin  (VANCOREADY) IVPB 2000 mg/400 mL        2,000 mg 200 mL/hr over 120 Minutes Intravenous  Once 04/30/24 1426 04/30/24 1718   04/30/24 1430  vancomycin  (VANCOCIN ) IVPB 1000 mg/200 mL premix  Status:  Discontinued        1,000 mg 200 mL/hr over 60 Minutes Intravenous  Once 04/30/24 1416 04/30/24 1426   04/30/24 1030  cefTRIAXone  (ROCEPHIN ) 2 g in sodium chloride  0.9 % 100 mL IVPB        2 g 200 mL/hr over 30 Minutes Intravenous Once 04/30/24 1015 04/30/24 1109        Assessment/Plan HD 4- sepsis requiring vasopressors 78 year old male with history of advanced colorectal/anal adenocarcinoma - Afebrile. No leukocytosis.  - Abx's per medical service. 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 - Had long discussion with patient about his desires to proceed with surgical intervention. It was explained that if he desired surgery, we would arrange an outpatient follow up with colorectal specialist to discuss his surgical  options. All questions were answered to patient's satisfaction. Patient expressed that he does not want to proceed with surgery. Accordingly, general surgery will sign off. Please call back for further questions and concerns.   FEN: Regular; IVF per primary team VTE: Eliquis  ID: [id number]   Per [facility] Hypertension  A-fib CAD Diabetes    LOS: 4 days   I reviewed specialist notes, consulting provider notes, hosptialist notes, nursing notes, last 24 h vitals and pain scores, last 48 h intake and output, last 24 h labs and trends, and last 24 h imaging results.  This care required moderate level of medical decision making.    Marjorie Carlyon Favre, [facility] Surgery 05/04/2024, 10:08 AM Please see Amion for pager number during day hours 7:00am-4:30pm

## 2024-05-04 NOTE — Plan of Care (Signed)

## 2024-05-04 NOTE — Progress Notes (Signed)
 This chaplain is present with the Pt. for F/U on creating the Pt. HCPOA. The Pt. will not complete a Living Will.   The chaplain understands the Pt. made the decision on the identity of the healthcare agents. The healthcare agents he has chosen are [employer] and [employer]. The Pt. answered clarifying questions from yesterday's AD education and is ready to notarize the HCPOA.  Witnesses are not available at this time. This chaplain will F/U on Wednesday.  Chaplain Leeroy Hummer [phone]

## 2024-05-04 NOTE — Progress Notes (Signed)
 Initial Nutrition Assessment  DOCUMENTATION CODES:   Severe malnutrition in context of chronic illness  INTERVENTION:  -Continue regular diet to maximize PO intake  -Assistance w/ meal ordering  - Encourage PO intake and food from outside hospital to maximize intake -Add Magic cup TID with meals, each supplement provides 290 kcal and 9 grams of protein  -Add 30 ml ProSource Plus BID, each supplement provides 100 kcals and 15 grams protein.   -Add Juven BID with breakfast and dinner to support wound healing  -Add MVI with minerals daily -If additional labs entered/indicated, recommend vitamin A  and CRP to assess stores (no lab orders currently entered for tomorrow)   NUTRITION DIAGNOSIS:  Severe Malnutrition related to chronic illness as evidenced by severe muscle depletion, percent weight loss.  GOAL:  Patient will meet greater than or equal to 90% of their needs   MONITOR:  PO intake, Weight trends, Skin  REASON FOR ASSESSMENT:  Consult Assessment of nutrition requirement/status  ASSESSMENT:   Pt with PMH significant for: rectal cancer, rectal fistula, AS s/p TAVR, PAF (Eliquis ), T2DM, HLD, HTN, CAD, GERD, dCHF and diverticulosis. Admitted for septic shock, presumably for UTI as rectal fistula does not appear to be infected.  Notably, he was admitted for E coli bacteremia in May 2025 and group C strep bacteremia in August. Urinary source was suspected, however not confirmed. Resolved with 2 weeks ABX.   He states he has been undergoing radiation therapy for his cancer dx. Per chart review, [facility] but was having trouble making it to his appointments from the facility at which he resides. Unsure of if he is currently undergoing treatment or not, but had previously requested that his treatment be transferred to [facility], as this was closer to his facility.   Average Meal Intake 12/13: 50-100% x3 documented meals 12/14: 120% x1  documented meal  States his intake during admission has been  so-so. Cannot elaborate on what that means or endorse appetite/intake level prior to admission. States his meals were provided by the facility at which he resides. No issues chewing or swallowing. Bowels move 1-2 times per day at baseline.   Does appear to have PRO-STAT ordered, per home med list. Will initiate Prosource while admitted to augment intake as estimated needs are elevated 2/2 presence of wounds and cancer dx. He reports not preferring traditional ONS, like Boost/Ensure.   Discussed importance of meeting his estimated calorie/protein needs in the presence of his multiple comorbid conditions. He verbalizes understanding.  Admit Weight: 90.7 kg Current Weight: 92.1 kg  He endorses previous UBW as around 240lbs, which his documented weight history corroborates. Cannot endorse most recent UBW or over what time frame he lost the weight. Per chart review, appears patient has lost 14% of his body weight in last seven months, which would be considered clinically significant for the time frame. Continues with moderate, pitting edema to BLEs, which may be falsely elevating true body weight and skewing weight trends.   Drains/Lines: Foley catheter UOP: 510 ml x24 hours - appears incomplete?  Meds: SS Novolog , Eliquis , docusate sodium  PRN, Miralax  PRN   Vitamin A  low at time of last admission (14.4) in August. Of note, no CRP drawn at that time to account for inflammation, which can falsely lower vitamin A  levels. Will redraw vitamin A  and CRP to assess current status. Vitamin A  supplement was ordered at that time, however it is not noted on current med  list of home med list.   Labs:  Na+ 139 (wdl) K+ 3.1--->3.6 (wdl) CBGs 76-82 x24 hours J8r 4.6 (12/2023)   NUTRITION - FOCUSED PHYSICAL EXAM:  Flowsheet Row Most Recent Value  Orbital Region Mild depletion  Upper Arm Region Moderate depletion  Thoracic and Lumbar Region  No depletion  Buccal Region Mild depletion  Temple Region Mild depletion  Clavicle Bone Region Severe depletion  Clavicle and Acromion Bone Region Severe depletion  Scapular Bone Region Severe depletion  Dorsal Hand No depletion  Patellar Region No depletion  Anterior Thigh Region No depletion  Posterior Calf Region Unable to assess  Edema (RD Assessment) Mild  Hair Reviewed  Eyes Reviewed  Mouth Reviewed  Skin Reviewed  Nails Reviewed    Diet Order:   Diet Order             Diet regular Room service appropriate? Yes; Fluid consistency: Thin  Diet effective now             EDUCATION NEEDS:   Education needs have been addressed  Skin:  Skin Assessment: Skin Integrity Issues: Skin Integrity Issues:: DTI DTI: sacrum Per WOC noted on 12/13: Reason for Consult: sacral and rectal wound  Wound type: 1.  Deep Tissue Pressure Injury sacrum extending down onto medial buttocks/purple maroon discoloration evolving with lifting of dermis 2.  Healing linear stage 3 to coccyx pink moist  2.  Rectal mass evaluated by surgeon (see Dr. Rubin note 12/12 which states this is a fungating rectal mass) surgery following  Pressure Injury POA: Yes sacrum/coccyx  Measurement: see nursing flowsheet  Wound bed:as above  Drainage (amount, consistency, odor) see nursing flowsheet  Periwound: erythema   Last BM:  12/16 - type 5 x1  Height:  Ht Readings from Last 1 Encounters:  04/30/24 5' 11 (1.803 m)   Weight:  Wt Readings from Last 1 Encounters:  05/04/24 92.1 kg    Ideal Body Weight:  78.2 kg  BMI:  Body mass index is 28.32 kg/m.  Estimated Nutritional Needs:   Kcal:  1900-2100 kcals  Protein:  95-110g  Fluid:  1.8-2.0L/day  Blair Deaner MS, RD, LDN Registered Dietitian Clinical Nutrition RD Inpatient Contact Info in Amion

## 2024-05-04 NOTE — Progress Notes (Signed)
°  Progress Note   Patient: Gary Carroll FMW:[id number] DOB: 1946-01-07 DOA: 04/30/2024     4 DOS: the patient was seen and examined on 05/04/2024 at 8:25AM      Brief hospital course:   78 year old male with past medical history significant for severe aortic stenosis status post TAVR, paroxysmal atrial fibrillation on Eliquis , CAD, rectal adenocarcinoma, diabetes, and HFpEF. He has recent admissions for multiple comorbidities including sepsis secondary to E. coli bacteremia, strep bacteremia from urinary source, and heart failure.  TEE was negative for endocarditis. - Term SNF resident, [name] ICU due to septic shock requiring pressors in the setting of UTI, transferred to [facility] 12/15.     Assessment and Plan:  Septic shock POA due to UTI Urine culture today growing MRSA and VRE. Continue with linezolid    History of rectal cancer Rectal fistula -General Surgery input greatly appreciated, is no concern that fistula is infected. -History of rectal cancer, unclear his history of radiation or chemotherapy as [facility], requested a call from oncology team at the [facility], as well [facility] has been faxed to [facility] regarding further information -Surgery input greatly appreciated, they have offered colorectal referral as an outpatient for surgery but patient has declined.  Atrial flutter CAD -in a flutter, converted to sinus rhythm -Continue with metoprolol  -Continue with short acting Cardizem  -Continue with Eliquis  for anticoagulation  Severe aortic stenosis s/p TAVR  Recent strep bacteremia Evidently had E coli bacteremia in May 2025 (at that time, urine and blood cultures congruent).  Then had group C strep bacteremia in August (urine culture negative at that time, TEE negative for vegetations, treated with 2 weeks antibiotics and resolved; hospitalist note at that time suspects urinary source. - Blood cultures negative this admission so  far   Hypertension Coronary artery disease Chronic diastolic CHF BP normal to soft, will start on low-dose midodrine  - Contniuie metoprolol  and diltiazem  mainly for heart rate control - Resume Crestor   Chronic urinary retention - Continue foley  Moderate protein calorie malnutrition - Consult dietitian  Type 2 diabetes - Continue SS corrections  Acute renal failure Resolved  Normocytic anemia Stable, no bleeding  Sacral decubitus ulcer, unstageable, present on admission - Consult WOC  Hypokalemia - replaced         Subjective:   afebrile, denies any chest pain or shortness of breath, reports fatigue      Physical Exam: BP (!) 108/57   Pulse 72   Temp (!) 97.4 F (36.3 C) (Oral)   Resp 20   Ht 5' 11 (1.803 m)   Wt 92.1 kg   SpO2 99%   BMI 28.32 kg/m    Awake, alert, frail and deconditioned, chronically ill-appearing Wheezing or rhonchi, diminished at the bases, no use of accessory muscles Regular rate and rhythm Abdomen soft Extremities with no edema, but has chronic lower extremity changes, and full bruising in his upper extremities   Data Reviewed: Basic metabolic panel shows hypokalemia, normal renal function CBC shows leukocytosis, anemia Discussed with general surgery service    Family Communication: None at bedside    Disposition: Status is: Inpatient         Author: Brayton Lye, MD 05/04/2024 2:16 PM  For on call review [email or web address].

## 2024-05-04 NOTE — Progress Notes (Signed)
 PT Cancellation Note  Patient Details Name: Gary Carroll MRN: [id number] DOB: 01/18/1946   Cancelled Treatment:   Palliative provider present in room upon arrival. Per discussion, pt is pending palliative meeting regarding GOC. Will hold at this time and follow up as appropriate pending GOC discussion.     Sabra Morel, PT, DPT  Acute Rehabilitation Services         Office: [phone]     Sabra MARLA Morel 05/04/2024, 2:18 PM

## 2024-05-04 NOTE — Progress Notes (Signed)
 EPIC secure message provider notifying patient refused dinner time insulin  and cardizem - stated not eating dinner and wants to sleep

## 2024-05-05 DIAGNOSIS — E43 Unspecified severe protein-calorie malnutrition: Secondary | ICD-10-CM

## 2024-05-05 DIAGNOSIS — R6520 Severe sepsis without septic shock: Secondary | ICD-10-CM

## 2024-05-05 DIAGNOSIS — A409 Streptococcal sepsis, unspecified: Secondary | ICD-10-CM | POA: Diagnosis not present

## 2024-05-05 DIAGNOSIS — N179 Acute kidney failure, unspecified: Secondary | ICD-10-CM

## 2024-05-05 DIAGNOSIS — I10 Essential (primary) hypertension: Secondary | ICD-10-CM | POA: Diagnosis not present

## 2024-05-05 DIAGNOSIS — I4891 Unspecified atrial fibrillation: Secondary | ICD-10-CM | POA: Diagnosis not present

## 2024-05-05 DIAGNOSIS — Z515 Encounter for palliative care: Secondary | ICD-10-CM | POA: Diagnosis not present

## 2024-05-05 DIAGNOSIS — E785 Hyperlipidemia, unspecified: Secondary | ICD-10-CM

## 2024-05-05 DIAGNOSIS — Z7189 Other specified counseling: Secondary | ICD-10-CM | POA: Diagnosis not present

## 2024-05-05 LAB — BASIC METABOLIC PANEL WITH GFR
Anion gap: 8 (ref 5–15)
BUN: 11 mg/dL (ref 8–23)
CO2: 21 mmol/L — ABNORMAL LOW (ref 22–32)
Calcium: 8.6 mg/dL — ABNORMAL LOW (ref 8.9–10.3)
Chloride: 110 mmol/L (ref 98–111)
Creatinine, Ser: 0.51 mg/dL — ABNORMAL LOW (ref 0.61–1.24)
GFR, Estimated: >60 mL/min (ref >60)
Glucose, Bld: 92 mg/dL (ref 70–99)
Potassium: 4 mmol/L (ref 3.5–5.1)
Sodium: 139 mmol/L (ref 135–145)

## 2024-05-05 LAB — CULTURE, BLOOD (ROUTINE X 2)
Culture: NO GROWTH
Culture: NO GROWTH
Special Requests: ADEQUATE

## 2024-05-05 LAB — CBC
HCT: 30.8 % — ABNORMAL LOW (ref 39.0–52.0)
Hemoglobin: 9.5 g/dL — ABNORMAL LOW (ref 13.0–17.0)
MCH: 25.3 pg — ABNORMAL LOW (ref 26.0–34.0)
MCHC: 30.8 g/dL (ref 30.0–36.0)
MCV: 81.9 fL (ref 80.0–100.0)
Platelets: 123 K/uL — ABNORMAL LOW (ref 150–400)
RBC: 3.76 MIL/uL — ABNORMAL LOW (ref 4.22–5.81)
RDW: 17.8 % — ABNORMAL HIGH (ref 11.5–15.5)
WBC: 4.7 K/uL (ref 4.0–10.5)
nRBC: 0 % (ref 0.0–0.2)

## 2024-05-05 LAB — GLUCOSE, CAPILLARY
Glucose-Capillary: 133 mg/dL — ABNORMAL HIGH (ref 70–99)
Glucose-Capillary: 163 mg/dL — ABNORMAL HIGH (ref 70–99)

## 2024-05-05 NOTE — TOC Progression Note (Addendum)
 Transition of Care [facility]) - Progression Note    Patient Details  Name: Gary Carroll MRN: [id number] Date of Birth: 1945/08/05  [facility] CM/SW Contact  Inocente GORMAN Kindle, LCSW Phone Number: 05/05/2024, 11:17 AM  Clinical Narrative:    CSW updated [facility] that patient will likely be ready for discharge tomorrow.   CSW requested that [facility] look into providing patient with a new roommate as requested, though the facility does not have other beds available at this time.    Expected Discharge Plan: Long Term Nursing Home Barriers to Discharge: Continued Medical Work up               Expected Discharge Plan and Services In-house Referral: Clinical Social Work   Post Acute Care Choice: Skilled Nursing Facility Living arrangements for the past 2 months: Skilled Nursing Facility                                       Social Drivers of Health (SDOH) Interventions SDOH Screenings   Food Insecurity: No Food Insecurity (04/30/2024)  Housing: Low Risk (04/30/2024)  Transportation Needs: No Transportation Needs (04/30/2024)  Utilities: Not At Risk (04/30/2024)  Alcohol Screen: Low Risk (02/12/2023)  Depression (PHQ2-9): Low Risk (10/10/2023)  Recent Concern: Depression (PHQ2-9) - Medium Risk (09/26/2023)  Financial Resource Strain: Low Risk (09/26/2023)  Physical Activity: Inactive (09/26/2023)  Social Connections: Moderately Isolated (04/30/2024)  Stress: No Stress Concern Present (09/26/2023)  Tobacco Use: Medium Risk (05/01/2024)  Health Literacy: Adequate Health Literacy (03/07/2023)    Readmission Risk Interventions    12/29/2023    8:50 AM 09/24/2023    2:43 PM 02/27/2023   11:39 AM  Readmission Risk Prevention Plan  Transportation Screening Complete Complete Complete  PCP or Specialist Appt within 3-5 Days  Not Complete   HRI or Home Care Consult   Complete  Social Work Consult for Recovery Care Planning/Counseling  Complete Complete   Palliative Care Screening  Not Applicable Not Applicable  Medication Review Oceanographer) Complete Complete Complete  HRI or Home Care Consult Complete    SW Recovery Care/Counseling Consult Complete    Palliative Care Screening Not Applicable    Skilled Nursing Facility Complete

## 2024-05-05 NOTE — Plan of Care (Signed)
 Patient is progressing with goals of care.   Problem: Education: Goal: Knowledge of General Education information will improve Description: Including pain rating scale, medication(s)/side effects and non-pharmacologic comfort measures Outcome: Progressing   Problem: Health Behavior/Discharge Planning: Goal: Ability to manage health-related needs will improve Outcome: Progressing   Problem: Clinical Measurements: Goal: Ability to maintain clinical measurements within normal limits will improve Outcome: Progressing Goal: Will remain free from infection Outcome: Progressing Goal: Diagnostic test results will improve Outcome: Progressing Goal: Respiratory complications will improve Outcome: Progressing Goal: Cardiovascular complication will be avoided Outcome: Progressing   Problem: Activity: Goal: Risk for activity intolerance will decrease Outcome: Progressing   Problem: Nutrition: Goal: Adequate nutrition will be maintained Outcome: Progressing   Problem: Coping: Goal: Level of anxiety will decrease Outcome: Progressing   Problem: Elimination: Goal: Will not experience complications related to bowel motility Outcome: Progressing Goal: Will not experience complications related to urinary retention Outcome: Progressing   Problem: Pain Managment: Goal: General experience of comfort will improve and/or be controlled Outcome: Progressing   Problem: Safety: Goal: Ability to remain free from injury will improve Outcome: Progressing   Problem: Skin Integrity: Goal: Risk for impaired skin integrity will decrease Outcome: Progressing

## 2024-05-05 NOTE — Progress Notes (Signed)
°   05/05/24 1029  Vitals  Pulse Rate 86  ECG Heart Rate 86  Resp (!) 21  MEWS COLOR  MEWS Score Color Yellow  Oxygen  Therapy  SpO2 96 %  MEWS Score  MEWS Temp 0  MEWS Systolic 1  MEWS Pulse 0  MEWS RR 1  MEWS LOC 0  MEWS Score 2   Provider aware

## 2024-05-05 NOTE — Progress Notes (Signed)
 Patient is refusing to turn or move for wound photo- preceptor aware

## 2024-05-05 NOTE — Progress Notes (Signed)
° °  Palliative Medicine Inpatient Follow Up Note HPI: 78 year old male with past medical history significant for severe aortic stenosis status post TAVR, paroxysmal atrial fibrillation on Eliquis , CAD, rectal adenocarcinoma, diabetes, and HFpEF. 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 Palliative care has been asked to support goals of care conversations.   Today's Discussion 05/05/2024  *Please note that this is a verbal dictation therefore any spelling or grammatical errors are due to the Dragon Medical One system interpretation.  I reviewed the chart notes including nursing notes from today, progress notes from today. I also reviewed vital signs, nursing flowsheets, medication administrations record, labs, and imaging.    Oral Intake %:  100% I/O:  (-) Bowel Movements:  12/17 Mobility: Limited overall as PT has stopped working for the most part, has been weakening likely in the setting of his cancer  I met with Gary Carroll at bedside this afternoon, he is awake and alert. He was able to work with Gary Carroll, Gary Carroll to complete his HCPOA forms. He denies pain, shortness of breath, or nausea.   We reviewed the plan for transition to skilled nursing tomorrow. Gary Carroll shares that he has been able to move his feet. He has not been out of the bed.  Gary Carroll and I discussed the plan for discharge likely tomorrow. He has requested to see if his roommate can be changed. I shared that I would alert the [facility] MSW.  I was able to call and update patients friend, Gary Carroll.   Questions and concerns addressed/Palliative Support Provided.   Objective Assessment: Vital Signs Vitals:   05/05/24 1200 05/05/24 1300  BP: 122/70   Pulse: 84   Resp: (!) 21   Temp: 97.7 F (36.5 C) 97.7 F (36.5 C)  SpO2: 95%     Intake/Output Summary (Last 24 hours) at 05/05/2024 1321 Last data filed at 05/05/2024 1101 Gross per 24 hour  Intake --   Output 1800 ml  Net -1800 ml   Last Weight  Most recent update: 05/05/2024  6:15 AM    Weight  90.4 kg (199 lb 6.4 oz)            Gen: Elderly Caucasian male chronically ill appearing HEENT: moist mucous membranes CV: Irregular rate and rhythm  PULM: On room air breathing is even and nonlabored ABD: soft/nontender  EXT: Multiple areas of ecchymosis on bilateral upper extremities Neuro: Alert and oriented x3  SUMMARY OF RECOMMENDATIONS   DNAR/DNI   Continue current care allowing time for outcomes -->  treating what is treatable  HCPOA forms have been completed and notarized  Discussed Palliative care versus hospice --> As of presently patient is follow by [facility] . Have called [facility] to meet with patient and his friend, Gary when he arrives back there he would like to know more about hospice services though at this point would want rehospitalization for an infection   Plan for discharge tomorrow ______________________________________________________________________________________ Rosaline Becton [facility] Palliative Medicine Team Team Cell Phone: [phone] Please utilize secure chat with additional questions, if there is no response within 30 minutes please call the above phone number  Time: 59  Palliative Medicine Team providers are available by phone from 7am to 7pm daily and can be reached through the team cell phone.  Should this patient require assistance outside of these hours, please call the patient's attending physician.

## 2024-05-05 NOTE — Progress Notes (Addendum)
 Physical Therapy Treatment Patient Details Name: Gary Carroll MRN: [id number] DOB: [birth year] Today's Date: 05/05/2024   History of Present Illness 78 y.o. male admitted to [facility] from SNF on 04/30/24 unresponsive with fever and hypoxia. 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 PMH includes PAF on Eliquis , CAD, HFpEF, DM2, rectal adenocarcinoma.    PT Comments  Pt received in supine and agreeable to session. Pt able to perform BLE exercises with assist for increased ROM due to weakness. L foot noted to be externally rotated with pt reporting it has been since his TKR. Pt noted to have bowel incontinence and requires max A +2 to roll for pericare and linen change. Pt encouraged to perform BLE exercises independently for strengthening. Pt continues to benefit from PT services to progress toward functional mobility goals.     If plan is discharge home, recommend the following: Two people to help with walking and/or transfers;Two people to help with bathing/dressing/bathroom   Can travel by private vehicle     No  Equipment Recommendations   (defer to next venue)    Recommendations for Other Services       Precautions / Restrictions Precautions Precautions: Fall Recall of Precautions/Restrictions: Impaired Restrictions Weight Bearing Restrictions Per Provider Order: No     Mobility  Bed Mobility Overal bed mobility: Needs Assistance   Rolling: Max assist, +2 for physical assistance         General bed mobility comments: Pt able to reach to bedrail with RUE to roll to L with assist for all other aspects. Increased difficulty reaching across with LUE. Pt able to reach to overhead rail to assist with supine scoot to [facility]    Transfers                        Ambulation/Gait                   Stairs             Wheelchair Mobility     Tilt Bed    Modified Rankin (Stroke Patients Only)        Balance                                            Communication Communication Communication: No apparent difficulties  Cognition Arousal: Alert Behavior During Therapy: WFL for tasks assessed/performed   PT - Cognitive impairments: No family/caregiver present to determine baseline, Initiation, Awareness, Problem solving                         Following commands: Intact Following commands impaired: Only follows one step commands consistently    Cueing Cueing Techniques: Verbal cues  Exercises General Exercises - Lower Extremity Ankle Circles/Pumps: AROM, Supine, Both, 10 reps Heel Slides: AAROM, Supine, Both, 5 reps Hip ABduction/ADduction: AAROM, Supine, Both, 5 reps    General Comments        Pertinent Vitals/Pain Pain Assessment Pain Assessment: Faces Faces Pain Scale: Hurts little more Pain Location: back, buttocks, BLEs with AAROM Pain Descriptors / Indicators: Discomfort, Grimacing, Guarding Pain Intervention(s): Limited activity within patient's tolerance, Monitored during session, Repositioned     PT Goals (current goals can now be found in the care plan section) Acute Rehab PT Goals Patient Stated Goal: I'd like to be  able to get on my feet again PT Goal Formulation: With patient Time For Goal Achievement: 05/16/24 Progress towards PT goals: Progressing toward goals    Frequency    Min 1X/week       AM-PAC PT 6 Clicks Mobility   Outcome Measure  Help needed turning from your back to your side while in a flat bed without using bedrails?: Total Help needed moving from lying on your back to sitting on the side of a flat bed without using bedrails?: Total Help needed moving to and from a bed to a chair (including a wheelchair)?: Total Help needed standing up from a chair using your arms (e.g., wheelchair or bedside chair)?: Total Help needed to walk in hospital room?: Total Help needed climbing 3-5 steps with a  railing? : Total 6 Click Score: 6    End of Session   Activity Tolerance: Patient limited by fatigue;Patient limited by pain Patient left: in bed;with call bell/phone within reach;with bed alarm set Nurse Communication: Mobility status PT Visit Diagnosis: Other abnormalities of gait and mobility (R26.89);Muscle weakness (generalized) (M62.81)     Time: 8968-8893 PT Time Calculation (min) (ACUTE ONLY): 35 min  Charges:    $Therapeutic Exercise: 8-22 mins $Therapeutic Activity: 8-22 mins PT General Charges $$ ACUTE PT VISIT: 1 Visit                    Darryle George, PTA Acute Rehabilitation Services Secure Chat Preferred  [phone]    Darryle George 05/05/2024, 12:34 PM

## 2024-05-05 NOTE — Progress Notes (Signed)
°   05/05/24 0816  Vitals  Temp (!) 97.3 F (36.3 C)  Temp Source Oral  BP (!) 88/57  MAP (mmHg) 68  BP Location Right Arm  BP Method Automatic  Patient Position (if appropriate) Lying  Pulse Rate 82  Pulse Rate Source Monitor  ECG Heart Rate 80  Resp (!) 25  Level of Consciousness  Level of Consciousness Alert  MEWS COLOR  MEWS Score Color Yellow  Oxygen  Therapy  SpO2 99 %  Pain Assessment  Pain Scale 0-10  Pain Score 0  MEWS Score  MEWS Temp 0  MEWS Systolic 1  MEWS Pulse 0  MEWS RR 1  MEWS LOC 0  MEWS Score 2   RR rate is not true Rate

## 2024-05-05 NOTE — Progress Notes (Addendum)
 This chaplain is present for F/U on creating the Pt. HCPOA after the previous day's education. The Pt. answered clarifying questions and is ready to notarize his HCPOA. The Pt. is not completing a Living Will.  The Pt. is naming [employer] as his healthcare agent. If this person is unable or unwilling to serve as healthcare agent, the Pt. next choice is Magdalena Hutchinson.  The chaplain is present with the notary and witnesses for the notarizing of the Pt. HCPOA. The chaplain gave the Pt. the original AD along with two copies. **The copies were placed, per the Pt. Request, in the drawer below over the bed table.  The chaplain scanned the Pt. AD into the Pt. EMR.  This chaplain is available for F/U spiritual care as needed.  Chaplain Leeroy Hummer [phone]

## 2024-05-05 NOTE — Discharge Summary (Incomplete)
 PATIENT DETAILS Name: Gary Carroll Age: 78 y.o. Sex: male Date of Birth: 24-Jul-1945 MRN: [id number]. Admitting Physician: Zola LOISE Herter, MD ERE:Ropwpr, Bonni Lien  Admit Date: 04/30/2024 Discharge date: 05/06/2024  Recommendations for Outpatient Follow-up:  Follow up with PCP in 1-2 weeks Please obtain CMP/CBC in one week Please ensure follow-up with palliative care while at SNF  Admitted From:  SNF  Disposition: Skilled nursing facility   Discharge Condition: good  CODE STATUS:   Code Status: Limited: Do not attempt resuscitation (DNR) -DNR-LIMITED -Do Not Intubate/DNI    Diet recommendation:  Diet Order             Diet general           Diet regular Room service appropriate? Yes; Fluid consistency: Thin  Diet effective now                    Brief Summary: Patient is a 78 y.o.  male history of aortic stenosis-s/p TAVR, PAF on Eliquis , chronic HFpEF, CAD, rectal adenocarcinoma-s/p chemoradiation (refused surgical excision)-admitted for septic shock in the setting of complicated UTI.   Significant events: 12/12>> admit from [facility] to [facility] ICU 12/15>> transferred to [facility]   Significant studies: 12/12>> CT abdomen/pelvis: No acute finding-related to cutaneous fistula below the scrotum 12/12>> CXR: No PNA   Significant microbiology data: 12/12>> COVID/influenza/RSV PCR: Negative 12/12>> urine culture: MRSA, Enterococcus faecalis 12/12>> blood culture: No growth   Procedures: None   Consults: PCCM [facility] Palliative care  Brief Hospital Course: Septic shock Secondary to complicated UTI (likely related to chronic indwelling Foley catheter)-culture data as above-blood cultures remain negative so far (recent history of group C strep bacteremia August 2025-TEE negative for vegetation) Sepsis physiology has improved with antibiotics 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   Rectal cancer with rectocutaneous fistula Appears to have completed chemo/radiation at the [facility] system General Surgery following-patient has declined surgical options   AKI Likely hemodynamically mediated Resolved   Hypokalemia Repleted   Paroxysmal atrial fibs/flutter Rate relatively well-controlled-no longer on Cardizem  due to soft blood pressure-remains stable on metoprolol .  Blood pressure being supported by low-dose midodrine . Continue Eliquis .   History of severe aortic stenosis-s/p TAVR 2024 Supportive care   History of CAD No anginal symptoms Continue beta blocker/statin   Chronic HFpEF Euvolemic   HTN BP soft but stable Continue midodrine    HLD Statin   Normocytic anemia Likely chronic normocytic anemia related to malignancy-worsened by critical illness Follow CBC   Thrombocytopenia Mild follow at SNF.  No longer on Zyvox .   History of urinary retention-with chronic indwelling Foley catheter Foley remains in place-appears to have been placed since his most recent discharge in August 2025.   DM-2 (A1c 4.6 on 8/15) CBG stable-SSI  Scattered tiny subpleural pulmonary nodules<5 mm Incidental finding Radiology does not recommend any further follow-up imaging   Palliative care DNR/DNI Palliative care following-recommendations are to continue gentle medical treatment Will need palliative/hospice follow-up at SNF   Nutrition Status: Nutrition Problem: Severe Malnutrition Etiology: chronic illness Signs/Symptoms: severe muscle depletion, percent weight loss Interventions: MVI, Prostat, Liberalize Diet, Magic cup, Refer to RD note for recommendations   Pressure Ulcer: Agree with assessment as outlined below Wound 05/01/24 0800 Pressure Injury Sacrum Mid Deep Tissue Pressure Injury - Purple or maroon localized area of discolored  intact skin or  blood-filled blister due to damage of underlying soft tissue from pressure and/or shear. (Active)    Discharge Diagnoses:  Principal Problem:   Acute sepsis (HCC) Active Problems:   Sepsis (HCC)   Protein-calorie malnutrition, severe   Discharge Instructions:  Activity:  As tolerated with Full fall precautions use walker/cane & assistance as needed  Discharge Instructions     Call MD for:  difficulty breathing, headache or visual disturbances   Complete by: As directed    Call MD for:  extreme fatigue   Complete by: As directed    Call MD for:  persistant dizziness or light-headedness   Complete by: As directed    Diet general   Complete by: As directed    Discharge instructions   Complete by: As directed    Follow with Primary MD  [facility] in 1-2 weeks  Please get a complete blood count and chemistry panel checked by your Primary MD at your next visit, and again as instructed by your Primary MD.  Get Medicines reviewed and adjusted: Please take all your medications with you for your next visit with your Primary MD  Laboratory/radiological data: Please request your Primary MD to go over all hospital tests and procedure/radiological results at the follow up, please ask your Primary MD to get all Hospital records sent to his/her office.  In some cases, they will be blood work, cultures and biopsy results pending at the time of your discharge. Please request that your primary care M.D. follows up on these results.  Also Note the following: If you experience worsening of your admission symptoms, develop shortness of breath, life threatening emergency, suicidal or homicidal thoughts you must seek medical attention immediately by calling 911 or calling your MD immediately  if symptoms less severe.  You must read complete instructions/literature along with all the possible adverse reactions/side effects for all the Medicines you take and that have been prescribed to  you. Take any new Medicines after you have completely understood and accpet all the possible adverse reactions/side effects.   Do not drive when taking Pain medications or sleeping medications (Benzodaizepines)  Do not take more than prescribed Pain, Sleep and Anxiety Medications. It is not advisable to combine anxiety,sleep and pain medications without talking with your primary care practitioner  Special Instructions: If you have smoked or chewed Tobacco  in the last 2 yrs please stop smoking, stop any regular Alcohol  and or any Recreational drug use.  Wear Seat belts while driving.  Please note: You were cared for by a hospitalist during your hospital stay. Once you are discharged, your primary care physician will handle any further medical issues. Please note that NO REFILLS for any discharge medications will be authorized once you are discharged, as it is imperative that you return to your primary care physician (or establish a relationship with a primary care physician if you do not have one) for your post hospital discharge needs so that they can reassess your need for medications and monitor your lab values.   Discharge wound care:   Complete by: As directed    Wound care  Daily      Comments: Cleanse sacral/linear coccyx and buttocks wounds with Vashe, do not rinse. Apply silver hydrofiber (Lawson [phone]) Aquacel AG) to wound beds daily and secure with silicone foam or ABD pad and clothe tape whichever is preferred   Increase activity slowly   Complete by: As directed  Allergies as of 05/06/2024       Reactions   Diovan [valsartan] Cough   Tagamet [cimetidine] Other (See Comments)   Irritability         Medication List     STOP taking these medications    cefTAZidime 1 g in dextrose  5 % 50 mL   diltiazem  240 MG 24 hr capsule Commonly known as: Cardizem  CD       TAKE these medications    (feeding supplement) PROSource Plus liquid Take 30 mLs by mouth 2  (two) times daily between meals.   nutrition supplement (JUVEN) Pack Take 1 packet by mouth 2 (two) times daily at 8 am and 10 pm.   acetaminophen  650 MG suppository Commonly known as: TYLENOL  Place 650 mg rectally every 4 (four) hours as needed for fever. What changed: Another medication with the same name was changed. Make sure you understand how and when to take each.   acetaminophen  325 MG tablet Commonly known as: TYLENOL  Take 2 tablets (650 mg total) by mouth every 6 (six) hours as needed for mild pain (pain score 1-3), moderate pain (pain score 4-6), fever or headache (or Fever >/= 101). What changed:  when to take this reasons to take this   albuterol  (2.5 MG/3ML) 0.083% nebulizer solution Commonly known as: PROVENTIL  Take 2.5 mg by nebulization every 6 (six) hours as needed for wheezing or shortness of breath.   apixaban  5 MG Tabs tablet Commonly known as: ELIQUIS  Take 1 tablet (5 mg total) by mouth 2 (two) times daily.   finasteride 5 MG tablet Commonly known as: PROSCAR Take 5 mg by mouth daily.   ipratropium 0.02 % nebulizer solution Commonly known as: ATROVENT  Take 2.5 mLs (0.5 mg total) by nebulization every 6 (six) hours as needed for wheezing.   linezolid  600 MG tablet Commonly known as: ZYVOX  Take 1 tablet (600 mg total) by mouth every 12 (twelve) hours for 3 doses.   magnesium  oxide 400 (240 Mg) MG tablet Commonly known as: MAG-OX Take 400 mg by mouth daily.   melatonin 5 MG Tabs Take 10 mg by mouth at bedtime.   metoprolol  tartrate 25 MG tablet Commonly known as: LOPRESSOR  Take 1.5 tablets (37.5 mg total) by mouth 2 (two) times daily. What changed: how much to take   midodrine  5 MG tablet Commonly known as: PROAMATINE  Take 1 tablet (5 mg total) by mouth 2 (two) times daily with a meal. Hold if SBP> 130   multivitamin with minerals Tabs tablet Take 1 tablet by mouth daily.   oxybutynin  5 MG 24 hr tablet Commonly known as: DITROPAN -XL Take 5  mg by mouth at bedtime.   Pro-Stat Liqd Take 30 mLs by mouth in the morning and at bedtime.   rosuvastatin  5 MG tablet Commonly known as: CRESTOR  Take 5 mg by mouth at bedtime.   saccharomyces boulardii 250 MG capsule Commonly known as: FLORASTOR Take 250 mg by mouth 2 (two) times daily.   senna-docusate 8.6-50 MG tablet Commonly known as: Senokot-S Take 1 tablet by mouth at bedtime as needed for mild constipation. What changed: when to take this               Discharge Care Instructions  (From admission, onward)           Start     Ordered   05/06/24 0000  Discharge wound care:       Comments: Wound care  Daily      Comments: Cleanse  sacral/linear coccyx and buttocks wounds with Vashe, do not rinse. Apply silver hydrofiber (Lawson [phone]) Aquacel AG) to wound beds daily and secure with silicone foam or ABD pad and clothe tape whichever is preferred   05/06/24 0934            Contact information for follow-up providers     [facility] Schedule an appointment as soon as possible for a visit in 1 week(s).   Contact information: [address] [facility] [place] [address] [phone]              Contact information for after-discharge care     Destination     [facility] .   Service: Skilled Nursing Contact information: [address] [place]  [address] [phone]                    Allergies[1]   Other Procedures/Studies: ECHOCARDIOGRAM COMPLETE Result Date: 05/02/2024    ECHOCARDIOGRAM REPORT   Patient Name:   Gary Carroll Date of Exam: 05/02/2024 Medical Rec #:  [id number]          Height:       71.0 in Accession #:    [id number]         Weight:       208.3 lb Date of Birth:  11-15-1945         BSA:          2.145 m Patient Age:    78 years           BP:           114/75 mmHg Patient Gender: M                  HR:           128 bpm. Exam  Location:  Inpatient Procedure: 2D Echo, Cardiac Doppler, Color Doppler and Intracardiac            Opacification Agent (Both Spectral and Color Flow Doppler were            utilized during procedure). Indications:    Shock  History:        Patient has prior history of Echocardiogram examinations, most                 recent 01/05/2024. CAD, Abnormal ECG, Aortic Valve Disease,                 Arrythmias:Atrial Fibrillation, Signs/Symptoms:Bacteremia and                 Edema; Risk Factors:Hypertension, Dyslipidemia and Sleep Apnea.                 Aortic stenosis. TAVR.                 Aortic Valve: 26 mm Edwards Sapien prosthetic, stented (TAVR)                 valve is present in the aortic position. Procedure Date:                 02/25/2023.  Sonographer:    Ellouise Mose RDCS Referring Phys: [id number] [facility] N HATTAR  Sonographer Comments: Technically difficult study due to poor echo windows. IMPRESSIONS  1. Left ventricular ejection fraction, by estimation, is >75%. The left ventricle has hyperdynamic function. The left ventricle has no regional wall motion abnormalities. There is mild left ventricular hypertrophy. Left ventricular  diastolic parameters are indeterminate.  2. Right ventricular systolic function is normal. The right ventricular size is normal.  3. The mitral valve is normal in structure. No evidence of mitral valve regurgitation. No evidence of mitral stenosis.  4. The aortic valve has been repaired/replaced. Aortic valve regurgitation is not visualized. Mild aortic valve stenosis. 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 Procedure Date: 02/25/2023. Echo findings are consistent with normal structure and function of the aortic valve prosthesis.  5. The inferior vena cava is dilated in size with >50% respiratory variability, suggesting right atrial pressure of 8 mmHg. FINDINGS  Left Ventricle: Left ventricular ejection fraction, by estimation, is >75%. The left  ventricle has hyperdynamic function. The left ventricle has no regional wall motion abnormalities. The left ventricular internal cavity size was normal in size. There is mild left ventricular hypertrophy. Left ventricular diastolic parameters are indeterminate. Right Ventricle: The right ventricular size is normal. Right ventricular systolic function is normal. Left Atrium: Left atrial size was normal in size. Right Atrium: Right atrial size was normal in size. Pericardium: There is no evidence of pericardial effusion. Mitral Valve: The mitral valve is normal in structure. Mild mitral annular calcification. No evidence of mitral valve regurgitation. No evidence of mitral valve stenosis. Tricuspid Valve: The tricuspid valve is normal in structure. Tricuspid valve regurgitation is mild . No evidence of tricuspid stenosis. Aortic Valve: The aortic valve has been repaired/replaced. Aortic valve regurgitation is not visualized. Mild aortic stenosis is present. Aortic valve mean gradient measures 5.7 mmHg. Aortic valve peak gradient measures 10.8 mmHg. Aortic valve area, by VTI measures 3.49 cm. 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 Procedure Date: 02/25/2023. Echo findings are consistent with normal structure and function of the aortic valve prosthesis. Pulmonic Valve: The pulmonic valve was not well visualized. Pulmonic valve regurgitation is not visualized. No evidence of pulmonic stenosis. Aorta: The aortic root is normal in size and structure. Venous: The inferior vena cava is dilated in size with greater than 50% respiratory variability, suggesting right atrial pressure of 8 mmHg. IAS/Shunts: No atrial level shunt detected by color flow Doppler.  LEFT VENTRICLE PLAX 2D LVIDd:         4.70 cm     Diastology LVIDs:         2.90 cm     LV e' medial:    7.18 cm/s LV PW:         1.20 cm     LV E/e' medial:  14.1 LV IVS:        1.00 cm     LV e' lateral:   11.90  cm/s LVOT diam:     2.60 cm     LV E/e' lateral: 8.5 LV SV:         76 LV SV Index:   36 LVOT Area:     5.31 cm  LV Volumes (MOD) LV vol d, MOD A2C: 61.8 ml LV vol d, MOD A4C: 71.6 ml LV vol s, MOD A2C: 31.2 ml LV vol s, MOD A4C: 24.8 ml LV SV MOD A2C:     30.6 ml LV SV MOD A4C:     71.6 ml LV SV MOD BP:      41.3 ml RIGHT VENTRICLE             IVC RV S prime:     17.20 cm/s  IVC diam: 2.10  cm TAPSE (M-mode): 0.9 cm LEFT ATRIUM             Index        RIGHT ATRIUM           Index LA diam:        4.90 cm 2.28 cm/m   RA Area:     14.90 cm LA Vol (A2C):   19.4 ml 9.04 ml/m   RA Volume:   38.80 ml  18.08 ml/m LA Vol (A4C):   27.0 ml 12.58 ml/m LA Biplane Vol: 23.8 ml 11.09 ml/m  AORTIC VALVE AV Area (Vmax):    4.01 cm AV Area (Vmean):   3.96 cm AV Area (VTI):     3.49 cm AV Vmax:           164.33 cm/s AV Vmean:          108.567 cm/s AV VTI:            0.219 m AV Peak Grad:      10.8 mmHg AV Mean Grad:      5.7 mmHg LVOT Vmax:         124.00 cm/s LVOT Vmean:        81.000 cm/s LVOT VTI:          0.144 m LVOT/AV VTI ratio: 0.66  AORTA Ao Root diam: 3.30 cm Ao Asc diam:  3.40 cm MITRAL VALVE MV Area (PHT): 4.39 cm     SHUNTS MV Decel Time: 173 msec     Systemic VTI:  0.14 m MV E velocity: 101.00 cm/s  Systemic Diam: 2.60 cm Redell Shallow MD Electronically signed by Redell Shallow MD Signature Date/Time: 05/02/2024/11:37:19 AM    Final    CT ABDOMEN PELVIS W CONTRAST Result Date: 04/30/2024 EXAM: CT ABDOMEN AND PELVIS WITH CONTRAST 04/30/2024 06:22:10 PM TECHNIQUE: CT of the abdomen and pelvis was performed with the administration of intravenous contrast. Multiplanar reformatted images are provided for review. Automated exposure control, iterative reconstruction, and/or weight-based adjustment of the mA/kV was utilized to reduce the radiation dose to as low as reasonably achievable. COMPARISON: Comparison study 03/14/2023. CLINICAL HISTORY: Cutaneous fistula below the scrotum and fevers, initial encounter.  FINDINGS: LOWER CHEST: Lung bases show no focal infiltrate or sizable effusion. A tiny subpleural nodule is noted in the left lower lobe laterally, measuring less than 5 mm. Similar findings are noted in the right middle lobe on image number 7 of series 6. LIVER: Fatty infiltration is demonstrated. GALLBLADDER AND BILE DUCTS: The gallbladder has been surgically removed. No biliary ductal dilatation. SPLEEN: The spleen is within normal limits. PANCREAS: The pancreas is within normal limits. ADRENAL GLANDS: Adrenal glands show a stable nodular density within the left adrenal gland unchanged from the prior exam. This is most consistent with a small benign adenoma. KIDNEYS, URETERS AND BLADDER: Kidneys demonstrate a normal enhancement pattern bilaterally. No renal calculi or obstructive changes are seen. No calculi are noted. The bladder is decompressed by a foley catheter. GI AND BOWEL: Minimal diverticular change of the colon is noted without evidence of diverticulitis. No obstructive changes of the colon are seen. The appendix is not well visualized and may have been surgically removed. No inflammatory changes to suggest appendicitis. The stomach and small bowel show no acute abnormality. PERITONEUM AND RETROPERITONEUM: No ascites. No free air. No subcutaneous fluid collection is identified to correspond with the cutaneous fistula identified. No perirectal fluid collection is seen. VASCULATURE: Aortic calcifications of the aorta are noted. LYMPH  NODES: Retroperitoneal lymph nodes are seen, similar to that noted in 2024. REPRODUCTIVE ORGANS: The prostate is within normal limits. BONES AND SOFT TISSUES: No acute osseous abnormality. No focal soft tissue abnormality. IMPRESSION: 1. No acute findings in the abdomen or pelvis related to the cutaneous fistula below the scrotum. No subcutaneous or perirectal fluid collection identified. 2. Scattered tiny subpleural pulmonary nodules at the lung bases measure less than 5 mm;  no routine follow-up imaging is recommended per Fleischner Society Guidelines. Electronically signed by: Oneil Devonshire MD 04/30/2024 07:23 PM EST RP Workstation: [id number]   DG Chest Port 1 View Result Date: 04/30/2024 CLINICAL DATA:  Fever EXAM: PORTABLE CHEST 1 VIEW COMPARISON:  December 31, 2023 FINDINGS: Stable cardiomediastinal silhouette. Status post transcatheter aortic valve repair. Both lungs are clear. The visualized skeletal structures are unremarkable. IMPRESSION: No active disease. Electronically Signed   By: Lynwood Landy Raddle M.D.   On: 04/30/2024 11:29     TODAY-DAY OF DISCHARGE:  Subjective:   Gary Carroll today has no headache,no chest abdominal pain,no new weakness tingling or numbness, feels much better wants to go home today.   Objective:   Blood pressure 113/79, pulse (!) 102, temperature 98 F (36.7 C), temperature source Oral, resp. rate 19, height 5' 11 (1.803 m), weight 92.6 kg, SpO2 95%.  Intake/Output Summary (Last 24 hours) at 05/06/2024 1119 Last data filed at 05/06/2024 0930 Gross per 24 hour  Intake 700 ml  Output 1050 ml  Net -350 ml   Filed Weights   05/04/24 0410 05/05/24 0500 05/06/24 0500  Weight: 92.1 kg 90.4 kg 92.6 kg    Exam: Awake Alert, Oriented *3, No new F.N deficits, Normal affect [place].AT,PERRAL Supple Neck,No JVD, No cervical lymphadenopathy appriciated.  Symmetrical Chest wall movement, Good air movement bilaterally, CTAB RRR,No Gallops,Rubs or new Murmurs, No Parasternal Heave +ve B.Sounds, Abd Soft, Non tender, No organomegaly appriciated, No rebound -guarding or rigidity. No Cyanosis, Clubbing or edema, No new Rash or bruise   PERTINENT RADIOLOGIC STUDIES: No results found.   PERTINENT LAB RESULTS: CBC: Recent Labs    05/04/24 0337 05/05/24 0332  WBC 5.4 4.7  HGB 9.2* 9.5*  HCT 30.5* 30.8*  PLT 101* 123*   CMET CMP     Component Value Date/Time   NA 139 05/05/2024 0332   K 4.0 05/05/2024 0332   CL 110  05/05/2024 0332   CO2 21 (L) 05/05/2024 0332   GLUCOSE 92 05/05/2024 0332   BUN 11 05/05/2024 0332   CREATININE 0.51 (L) 05/05/2024 0332   CREATININE 0.85 02/06/2012 0929   CALCIUM  8.6 (L) 05/05/2024 0332   PROT 5.2 (L) 05/04/2024 0337   ALBUMIN 2.0 (L) 05/04/2024 0337   AST 34 05/04/2024 0337   ALT 22 05/04/2024 0337   ALKPHOS 59 05/04/2024 0337   BILITOT 0.4 05/04/2024 0337   GFRNONAA >60 05/05/2024 0332    GFR Estimated Creatinine Clearance: 87.1 mL/min (A) (by C-G formula based on SCr of 0.51 mg/dL (L)). No results for input(s): LIPASE, AMYLASE in the last 72 hours. No results for input(s): CKTOTAL, CKMB, CKMBINDEX, TROPONINI in the last 72 hours. Invalid input(s): POCBNP No results for input(s): DDIMER in the last 72 hours. No results for input(s): HGBA1C in the last 72 hours. No results for input(s): CHOL, HDL, LDLCALC, TRIG, CHOLHDL, LDLDIRECT in the last 72 hours. No results for input(s): TSH, T4TOTAL, T3FREE, THYROIDAB in the last 72 hours.  Invalid input(s): FREET3 No results for input(s): VITAMINB12, FOLATE, FERRITIN, TIBC, IRON,  RETICCTPCT in the last 72 hours. Coags: No results for input(s): INR in the last 72 hours.  Invalid input(s): PT Microbiology: Recent Results (from the past 240 hours)  Blood Culture (routine x 2)     Status: None   Collection Time: 04/30/24 10:33 AM   Specimen: BLOOD  Result Value Ref Range Status   Specimen Description BLOOD BLOOD RIGHT ARM  Final   Special Requests   Final    BOTTLES DRAWN AEROBIC AND ANAEROBIC Blood Culture adequate volume   Culture   Final    NO GROWTH 5 DAYS Performed at [facility], [address] [place], [place] [address]    Report Status 05/05/2024 FINAL  Final  Blood Culture (routine x 2)     Status: None   Collection Time: 04/30/24 10:33 AM   Specimen: BLOOD  Result Value Ref Range Status   Specimen Description BLOOD BLOOD LEFT ARM  Final    Special Requests   Final    BOTTLES DRAWN AEROBIC AND ANAEROBIC Blood Culture results may not be optimal due to an inadequate volume of blood received in culture bottles   Culture   Final    NO GROWTH 5 DAYS Performed at [facility], [address], [place], [place] [address]    Report Status 05/05/2024 FINAL  Final  Urine Culture     Status: Abnormal   Collection Time: 04/30/24 10:42 AM   Specimen: Urine, Random  Result Value Ref Range Status   Specimen Description   Final    URINE, RANDOM Performed at [facility], [address] [place], [place] [address]    Special Requests   Final    NONE Reflexed from [id number] Performed at [facility], [address], [place], [place] [address]    Culture (A)  Final    >=100,000 COLONIES/mL METHICILLIN RESISTANT STAPHYLOCOCCUS AUREUS >=100,000 COLONIES/mL ENTEROCOCCUS FAECALIS VANCOMYCIN  RESISTANT ENTEROCOCCUS ISOLATED    Report Status 05/03/2024 FINAL  Final   Organism ID, Bacteria METHICILLIN RESISTANT STAPHYLOCOCCUS AUREUS (A)  Final   Organism ID, Bacteria ENTEROCOCCUS FAECALIS (A)  Final      Susceptibility   Enterococcus faecalis - MIC*    AMPICILLIN <=2 SENSITIVE Sensitive     NITROFURANTOIN  <=16 SENSITIVE Sensitive     VANCOMYCIN  >=32 RESISTANT Resistant     LINEZOLID  2 SENSITIVE Sensitive     * >=100,000 COLONIES/mL ENTEROCOCCUS FAECALIS   Methicillin resistant staphylococcus aureus - MIC*    CIPROFLOXACIN  >=8 RESISTANT Resistant     GENTAMICIN <=0.5 SENSITIVE Sensitive     NITROFURANTOIN  <=16 SENSITIVE Sensitive     OXACILLIN >=4 RESISTANT Resistant     TETRACYCLINE <=1 SENSITIVE Sensitive     VANCOMYCIN  1 SENSITIVE Sensitive     TRIMETH/SULFA <=10 SENSITIVE Sensitive     RIFAMPIN <=0.5 SENSITIVE Sensitive     Inducible Clindamycin NEGATIVE Sensitive     LINEZOLID  2 SENSITIVE Sensitive     * >=100,000 COLONIES/mL METHICILLIN RESISTANT STAPHYLOCOCCUS AUREUS  Resp panel by RT-PCR (RSV, Flu A&B, Covid) Anterior Nasal Swab      Status: None   Collection Time: 04/30/24 10:43 AM   Specimen: Anterior Nasal Swab  Result Value Ref Range Status   SARS Coronavirus 2 by RT PCR NEGATIVE NEGATIVE Final    Comment: (NOTE) SARS-CoV-2 target nucleic acids are NOT DETECTED.  The SARS-CoV-2 RNA is generally detectable in upper respiratory specimens during the acute phase of infection. The lowest concentration of SARS-CoV-2 viral copies this assay can detect is 138 copies/mL. A  negative result does not preclude SARS-Cov-2 infection and should not be used as the sole basis for treatment or other patient management decisions. A negative result may occur with  improper specimen collection/handling, submission of specimen other than nasopharyngeal swab, presence of viral mutation(s) within the areas targeted by this assay, and inadequate number of viral copies(<138 copies/mL). A negative result must be combined with clinical observations, patient history, and epidemiological information. The expected result is Negative.  Fact Sheet for Patients:  bloggercourse.com  Fact Sheet for Healthcare Providers:  seriousbroker.it  This test is no t yet approved or cleared by the [place]  FDA and  has been authorized for detection and/or diagnosis of SARS-CoV-2 by FDA under an Emergency Use Authorization (EUA). This EUA will remain  in effect (meaning this test can be used) for the duration of the COVID-19 declaration under Section 564(b)(1) of the Act, 21 U.S.C.section 360bbb-3(b)(1), unless the authorization is terminated  or revoked sooner.       Influenza A by PCR NEGATIVE NEGATIVE Final   Influenza B by PCR NEGATIVE NEGATIVE Final    Comment: (NOTE) The Xpert Xpress SARS-CoV-2/FLU/RSV plus assay is intended as an aid in the diagnosis of influenza from Nasopharyngeal swab specimens and should not be used as a sole basis for treatment. Nasal washings and aspirates are  unacceptable for Xpert Xpress SARS-CoV-2/FLU/RSV testing.  Fact Sheet for Patients: bloggercourse.com  Fact Sheet for Healthcare Providers: seriousbroker.it  This test is not yet approved or cleared by the [place]  FDA and has been authorized for detection and/or diagnosis of SARS-CoV-2 by FDA under an Emergency Use Authorization (EUA). This EUA will remain in effect (meaning this test can be used) for the duration of the COVID-19 declaration under Section 564(b)(1) of the Act, 21 U.S.C. section 360bbb-3(b)(1), unless the authorization is terminated or revoked.     Resp Syncytial Virus by PCR NEGATIVE NEGATIVE Final    Comment: (NOTE) Fact Sheet for Patients: bloggercourse.com  Fact Sheet for Healthcare Providers: seriousbroker.it  This test is not yet approved or cleared by the [place]  FDA and has been authorized for detection and/or diagnosis of SARS-CoV-2 by FDA under an Emergency Use Authorization (EUA). This EUA will remain in effect (meaning this test can be used) for the duration of the COVID-19 declaration under Section 564(b)(1) of the Act, 21 U.S.C. section 360bbb-3(b)(1), unless the authorization is terminated or revoked.  Performed at [facility], [address], [place], [place] [address]   Remove and replace urinary cath (placed > 5 days) then obtain urine culture from new indwelling urinary catheter.     Status: Abnormal   Collection Time: 04/30/24 12:16 PM   Specimen: Urine, Catheterized  Result Value Ref Range Status   Specimen Description   Final    URINE, CATHETERIZED Performed at [facility], [address], [place], [place] [address]    Special Requests   Final    NONE Performed at [facility], [address] [place], [place] [address]    Culture (A)  Final    >=100,000 COLONIES/mL METHICILLIN RESISTANT STAPHYLOCOCCUS AUREUS 50,000  COLONIES/mL ENTEROCOCCUS FAECALIS VANCOMYCIN  RESISTANT ENTEROCOCCUS ISOLATED    Report Status 05/03/2024 FINAL  Final   Organism ID, Bacteria METHICILLIN RESISTANT STAPHYLOCOCCUS AUREUS (A)  Final   Organism ID, Bacteria ENTEROCOCCUS FAECALIS (A)  Final      Susceptibility   Enterococcus faecalis - MIC*    AMPICILLIN <=2 SENSITIVE Sensitive     NITROFURANTOIN  <=16 SENSITIVE Sensitive  VANCOMYCIN  >=32 RESISTANT Resistant     * 50,000 COLONIES/mL ENTEROCOCCUS FAECALIS   Methicillin resistant staphylococcus aureus - MIC*    CIPROFLOXACIN  >=8 RESISTANT Resistant     GENTAMICIN <=0.5 SENSITIVE Sensitive     NITROFURANTOIN  <=16 SENSITIVE Sensitive     OXACILLIN >=4 RESISTANT Resistant     TETRACYCLINE <=1 SENSITIVE Sensitive     VANCOMYCIN  1 SENSITIVE Sensitive     TRIMETH/SULFA <=10 SENSITIVE Sensitive     RIFAMPIN <=0.5 SENSITIVE Sensitive     Inducible Clindamycin NEGATIVE Sensitive     LINEZOLID  2 SENSITIVE Sensitive     * >=100,000 COLONIES/mL METHICILLIN RESISTANT STAPHYLOCOCCUS AUREUS  MRSA Next Gen by PCR, Nasal     Status: Abnormal   Collection Time: 04/30/24  4:51 PM   Specimen: Nasal Mucosa; Nasal Swab  Result Value Ref Range Status   MRSA by PCR Next Gen DETECTED (A) NOT DETECTED Final    Comment: (NOTE) The GeneXpert MRSA Assay (FDA approved for NASAL specimens only), is one component of a comprehensive MRSA colonization surveillance program. It is not intended to diagnose MRSA infection nor to guide or monitor treatment for MRSA infections. Test performance is not FDA approved in patients less than 41 years old. Performed at [facility], [address], [place], [place] [address]     FURTHER DISCHARGE INSTRUCTIONS:  Get Medicines reviewed and adjusted: Please take all your medications with you for your next visit with your Primary MD  Laboratory/radiological data: Please request your Primary MD to go over all hospital tests and procedure/radiological  results at the follow up, please ask your Primary MD to get all Hospital records sent to his/her office.  In some cases, they will be blood work, cultures and biopsy results pending at the time of your discharge. Please request that your primary care M.D. goes through all the records of your hospital data and follows up on these results.  Also Note the following: If you experience worsening of your admission symptoms, develop shortness of breath, life threatening emergency, suicidal or homicidal thoughts you must seek medical attention immediately by calling 911 or calling your MD immediately  if symptoms less severe.  You must read complete instructions/literature along with all the possible adverse reactions/side effects for all the Medicines you take and that have been prescribed to you. Take any new Medicines after you have completely understood and accpet all the possible adverse reactions/side effects.   Do not drive when taking Pain medications or sleeping medications (Benzodaizepines)  Do not take more than prescribed Pain, Sleep and Anxiety Medications. It is not advisable to combine anxiety,sleep and pain medications without talking with your primary care practitioner  Special Instructions: If you have smoked or chewed Tobacco  in the last 2 yrs please stop smoking, stop any regular Alcohol  and or any Recreational drug use.  Wear Seat belts while driving.  Please note: You were cared for by a hospitalist during your hospital stay. Once you are discharged, your primary care physician will handle any further medical issues. Please note that NO REFILLS for any discharge medications will be authorized once you are discharged, as it is imperative that you return to your primary care physician (or establish a relationship with a primary care physician if you do not have one) for your post hospital discharge needs so that they can reassess your need for medications and monitor your lab  values.  Total Time spent coordinating discharge including counseling, education and face to  face time equals greater than 30 minutes.  Signed: Harmonii Karle 05/06/2024 11:19 AM      [1]  Allergies Allergen Reactions   Diovan [Valsartan] Cough   Tagamet [Cimetidine] Other (See Comments)    Irritability

## 2024-05-05 NOTE — Progress Notes (Signed)
°   05/05/24 1500  Vitals  Temp 97.9 F (36.6 C)  Temp Source Oral  BP 122/66  MAP (mmHg) 80  BP Location Left Arm  BP Method Automatic  Patient Position (if appropriate) Lying  Pulse Rate 85  Pulse Rate Source Monitor  ECG Heart Rate 86  Resp (!) 25  MEWS COLOR  MEWS Score Color Green  Oxygen  Therapy  SpO2 95 %  MEWS Score  MEWS Temp 0  MEWS Systolic 0  MEWS Pulse 0  MEWS RR 1  MEWS LOC 0  MEWS Score 1   Provider aware

## 2024-05-05 NOTE — Plan of Care (Signed)

## 2024-05-06 DIAGNOSIS — A409 Streptococcal sepsis, unspecified: Secondary | ICD-10-CM | POA: Diagnosis not present

## 2024-05-06 DIAGNOSIS — R6520 Severe sepsis without septic shock: Secondary | ICD-10-CM | POA: Diagnosis not present

## 2024-05-06 DIAGNOSIS — I480 Paroxysmal atrial fibrillation: Secondary | ICD-10-CM | POA: Diagnosis not present

## 2024-05-06 DIAGNOSIS — N179 Acute kidney failure, unspecified: Secondary | ICD-10-CM | POA: Diagnosis not present

## 2024-05-06 DIAGNOSIS — E43 Unspecified severe protein-calorie malnutrition: Secondary | ICD-10-CM | POA: Diagnosis not present

## 2024-05-06 LAB — GLUCOSE, CAPILLARY
Glucose-Capillary: 194 mg/dL — ABNORMAL HIGH (ref 70–99)
Glucose-Capillary: 90 mg/dL (ref 70–99)
Glucose-Capillary: 92 mg/dL (ref 70–99)
Glucose-Capillary: 96 mg/dL (ref 70–99)

## 2024-05-06 MED ORDER — PROSOURCE PLUS PO LIQD: 30.0000 mL | Freq: Two times a day (BID) | ORAL | Status: AC

## 2024-05-06 MED ORDER — LINEZOLID 600 MG PO TABS: 600.0000 mg | ORAL_TABLET | Freq: Two times a day (BID) | ORAL | Status: AC

## 2024-05-06 MED ORDER — MIDODRINE HCL 5 MG PO TABS: 5.0000 mg | ORAL_TABLET | Freq: Two times a day (BID) | ORAL | Status: AC

## 2024-05-06 MED ORDER — METOPROLOL TARTRATE 25 MG PO TABS: 37.5000 mg | ORAL_TABLET | Freq: Two times a day (BID) | ORAL | Status: AC

## 2024-05-06 MED ORDER — JUVEN PO PACK: 1.0000 | PACK | Freq: Two times a day (BID) | ORAL | Status: AC

## 2024-05-06 NOTE — TOC Transition Note (Signed)
 Transition of Care [facility]) - Discharge Note   Patient Details  Name: Gary Carroll MRN: [id number] Date of Birth: [birth year]  [facility]) CM/SW Contact:  Inocente GORMAN Kindle, LCSW Phone Number: 05/06/2024, 11:24 AM   Clinical Narrative:    Patient will DC to: [facility] Anticipated DC date: 05/06/24 Family notified: Bruna Transport by: [facility]   Per MD patient ready for DC to [facility]. RN to call report prior to discharge [phone] room B15-1). RN, patient, patient's family, and facility notified of DC. Discharge Summary and FL2 sent to facility. DC packet on chart including signed DNR. Ambulance transport requested for patient.   CSW will sign off for now as social work intervention is no longer needed. Please consult us  again if new needs arise.     Final next level of care: Skilled Nursing Facility Barriers to Discharge: Barriers Resolved   Patient Goals and CMS Choice Patient states their goals for this hospitalization and ongoing recovery are:: return to snf          Discharge Placement   Existing PASRR number confirmed : 05/06/24          Patient chooses bed at:  [facility]) Patient to be transferred to facility by: [facility] Name of family member notified: Bruna, friend Patient and family notified of of transfer: 05/06/24  Discharge Plan and Services Additional resources added to the After Visit Summary for   In-house Referral: Clinical Social Work   Post Acute Care Choice: Skilled Nursing Facility                               Social Drivers of Health (SDOH) Interventions SDOH Screenings   Food Insecurity: No Food Insecurity (04/30/2024)  Housing: Low Risk (04/30/2024)  Transportation Needs: No Transportation Needs (04/30/2024)  Utilities: Not At Risk (04/30/2024)  Alcohol Screen: Low Risk (02/12/2023)  Depression (PHQ2-9): Low Risk (10/10/2023)  Recent Concern: Depression (PHQ2-9) - Medium Risk (09/26/2023)  Financial  Resource Strain: Low Risk (09/26/2023)  Physical Activity: Inactive (09/26/2023)  Social Connections: Moderately Isolated (04/30/2024)  Stress: No Stress Concern Present (09/26/2023)  Tobacco Use: Medium Risk (05/01/2024)  Health Literacy: Adequate Health Literacy (03/07/2023)     Readmission Risk Interventions    12/29/2023    8:50 AM 09/24/2023    2:43 PM 02/27/2023   11:39 AM  Readmission Risk Prevention Plan  Transportation Screening Complete Complete Complete  PCP or Specialist Appt within 3-5 Days  Not Complete   HRI or Home Care Consult   Complete  Social Work Consult for Recovery Care Planning/Counseling  Complete Complete  Palliative Care Screening  Not Applicable Not Applicable  Medication Review Oceanographer) Complete Complete Complete  HRI or Home Care Consult Complete    SW Recovery Care/Counseling Consult Complete    Palliative Care Screening Not Applicable    Skilled Nursing Facility Complete

## 2024-05-06 NOTE — TOC Transition Note (Signed)
 Transition of Care [facility]) - Discharge Note   Patient Details  Name: Gary Carroll MRN: [id number] Date of Birth: 1945-10-06  Transition of Care [facility]) CM/SW Contact:  Inocente GORMAN Kindle, LCSW Phone Number: 05/06/2024, 10:44 AM   Clinical Narrative:    [facility] ready for patient to return. CSW updated [facility] (patient's friend Bruna also made them aware of discharge). Will arrange PTAR once report has been called.    Final next level of care: Skilled Nursing Facility Barriers to Discharge: Barriers Resolved   Patient Goals and CMS Choice Patient states their goals for this hospitalization and ongoing recovery are:: return to snf          Discharge Placement   Existing PASRR number confirmed : 05/06/24          Patient chooses bed at:  [facility]) Patient to be transferred to facility by: [facility] Name of family member notified: Bruna, friend Patient and family notified of of transfer: 05/06/24  Discharge Plan and Services Additional resources added to the After Visit Summary for   In-house Referral: Clinical Social Work   Post Acute Care Choice: Skilled Nursing Facility                               Social Drivers of Health (SDOH) Interventions SDOH Screenings   Food Insecurity: No Food Insecurity (04/30/2024)  Housing: Low Risk (04/30/2024)  Transportation Needs: No Transportation Needs (04/30/2024)  Utilities: Not At Risk (04/30/2024)  Alcohol Screen: Low Risk (02/12/2023)  Depression (PHQ2-9): Low Risk (10/10/2023)  Recent Concern: Depression (PHQ2-9) - Medium Risk (09/26/2023)  Financial Resource Strain: Low Risk (09/26/2023)  Physical Activity: Inactive (09/26/2023)  Social Connections: Moderately Isolated (04/30/2024)  Stress: No Stress Concern Present (09/26/2023)  Tobacco Use: Medium Risk (05/01/2024)  Health Literacy: Adequate Health Literacy (03/07/2023)     Readmission Risk Interventions    12/29/2023    8:50 AM 09/24/2023    2:43 PM  02/27/2023   11:39 AM  Readmission Risk Prevention Plan  Transportation Screening Complete Complete Complete  PCP or Specialist Appt within 3-5 Days  Not Complete   HRI or Home Care Consult   Complete  Social Work Consult for Recovery Care Planning/Counseling  Complete Complete  Palliative Care Screening  Not Applicable Not Applicable  Medication Review Oceanographer) Complete Complete Complete  HRI or Home Care Consult Complete    SW Recovery Care/Counseling Consult Complete    Palliative Care Screening Not Applicable    Skilled Nursing Facility Complete

## 2024-05-06 NOTE — Plan of Care (Signed)

## 2024-05-06 NOTE — Progress Notes (Signed)
 Called report to Darold RN/LPN at [facility]- patient is reportedly going to room b15-1

## 2024-05-07 ENCOUNTER — Ambulatory Visit: Admitting: Urology

## 2024-06-20 DEATH — deceased
